# Patient Record
Sex: Female | Born: 1950 | Race: White | Hispanic: No | Marital: Married | Smoking: Never smoker
Health system: Southern US, Community
[De-identification: ages and names within clinical notes are randomized; demographics above are authoritative.]

## PROBLEM LIST (undated history)

## (undated) DIAGNOSIS — Z85828 Personal history of other malignant neoplasm of skin: Secondary | ICD-10-CM

## (undated) DIAGNOSIS — D219 Benign neoplasm of connective and other soft tissue, unspecified: Secondary | ICD-10-CM

## (undated) DIAGNOSIS — K635 Polyp of colon: Secondary | ICD-10-CM

## (undated) DIAGNOSIS — Z8619 Personal history of other infectious and parasitic diseases: Secondary | ICD-10-CM

## (undated) DIAGNOSIS — M1990 Unspecified osteoarthritis, unspecified site: Secondary | ICD-10-CM

## (undated) DIAGNOSIS — M419 Scoliosis, unspecified: Secondary | ICD-10-CM

## (undated) DIAGNOSIS — E785 Hyperlipidemia, unspecified: Secondary | ICD-10-CM

## (undated) DIAGNOSIS — N841 Polyp of cervix uteri: Secondary | ICD-10-CM

## (undated) DIAGNOSIS — L719 Rosacea, unspecified: Secondary | ICD-10-CM

## (undated) HISTORY — DX: Benign neoplasm of connective and other soft tissue, unspecified: D21.9

## (undated) HISTORY — DX: Unspecified osteoarthritis, unspecified site: M19.90

## (undated) HISTORY — DX: Personal history of other infectious and parasitic diseases: Z86.19

## (undated) HISTORY — DX: Scoliosis, unspecified: M41.9

## (undated) HISTORY — DX: Polyp of cervix uteri: N84.1

## (undated) HISTORY — DX: Polyp of colon: K63.5

## (undated) HISTORY — DX: Rosacea, unspecified: L71.9

## (undated) HISTORY — PX: BUNIONECTOMY: SHX129

## (undated) HISTORY — DX: Hyperlipidemia, unspecified: E78.5

## (undated) HISTORY — DX: Personal history of other malignant neoplasm of skin: Z85.828

---

## 2000-03-18 ENCOUNTER — Encounter: Payer: Self-pay | Admitting: *Deleted

## 2000-03-18 ENCOUNTER — Ambulatory Visit (HOSPITAL_COMMUNITY): Admission: RE | Admit: 2000-03-18 | Discharge: 2000-03-18 | Payer: Self-pay | Admitting: *Deleted

## 2001-02-28 ENCOUNTER — Other Ambulatory Visit: Admission: RE | Admit: 2001-02-28 | Discharge: 2001-02-28 | Payer: Self-pay | Admitting: Internal Medicine

## 2002-06-14 DIAGNOSIS — D219 Benign neoplasm of connective and other soft tissue, unspecified: Secondary | ICD-10-CM

## 2002-06-14 HISTORY — DX: Benign neoplasm of connective and other soft tissue, unspecified: D21.9

## 2002-06-26 ENCOUNTER — Other Ambulatory Visit: Admission: RE | Admit: 2002-06-26 | Discharge: 2002-06-26 | Payer: Self-pay | Admitting: Internal Medicine

## 2002-07-10 ENCOUNTER — Ambulatory Visit (HOSPITAL_COMMUNITY): Admission: RE | Admit: 2002-07-10 | Discharge: 2002-07-10 | Payer: Self-pay | Admitting: Internal Medicine

## 2002-07-10 ENCOUNTER — Encounter: Payer: Self-pay | Admitting: Internal Medicine

## 2002-07-18 ENCOUNTER — Encounter: Payer: Self-pay | Admitting: Internal Medicine

## 2003-06-15 DIAGNOSIS — K635 Polyp of colon: Secondary | ICD-10-CM

## 2003-06-15 HISTORY — DX: Polyp of colon: K63.5

## 2003-10-28 ENCOUNTER — Other Ambulatory Visit: Admission: RE | Admit: 2003-10-28 | Discharge: 2003-10-28 | Payer: Self-pay | Admitting: Internal Medicine

## 2003-11-22 ENCOUNTER — Ambulatory Visit (HOSPITAL_COMMUNITY): Admission: RE | Admit: 2003-11-22 | Discharge: 2003-11-22 | Payer: Self-pay | Admitting: Internal Medicine

## 2005-03-17 ENCOUNTER — Ambulatory Visit: Payer: Self-pay | Admitting: Internal Medicine

## 2005-03-17 ENCOUNTER — Other Ambulatory Visit: Admission: RE | Admit: 2005-03-17 | Discharge: 2005-03-17 | Payer: Self-pay | Admitting: Internal Medicine

## 2005-03-17 ENCOUNTER — Encounter: Payer: Self-pay | Admitting: Internal Medicine

## 2005-06-28 ENCOUNTER — Ambulatory Visit: Payer: Self-pay | Admitting: Internal Medicine

## 2005-07-20 ENCOUNTER — Ambulatory Visit (HOSPITAL_COMMUNITY): Admission: RE | Admit: 2005-07-20 | Discharge: 2005-07-20 | Payer: Self-pay | Admitting: Internal Medicine

## 2006-06-28 ENCOUNTER — Other Ambulatory Visit: Admission: RE | Admit: 2006-06-28 | Discharge: 2006-06-28 | Payer: Self-pay | Admitting: Internal Medicine

## 2006-06-28 ENCOUNTER — Ambulatory Visit: Payer: Self-pay | Admitting: Internal Medicine

## 2006-06-28 ENCOUNTER — Encounter: Payer: Self-pay | Admitting: Internal Medicine

## 2006-06-28 LAB — CONVERTED CEMR LAB
ALT: 19 units/L (ref 0–40)
AST: 27 units/L (ref 0–37)
Albumin: 4.3 g/dL (ref 3.5–5.2)
Alkaline Phosphatase: 54 units/L (ref 39–117)
BUN: 18 mg/dL (ref 6–23)
Basophils Absolute: 0 10*3/uL (ref 0.0–0.1)
Basophils Relative: 1 % (ref 0.0–1.0)
CO2: 28 meq/L (ref 19–32)
Calcium: 10 mg/dL (ref 8.4–10.5)
Chloride: 101 meq/L (ref 96–112)
Cholesterol: 280 mg/dL (ref 0–200)
Creatinine, Ser: 0.8 mg/dL (ref 0.4–1.2)
Direct LDL: 172.6 mg/dL
Eosinophils Relative: 1.1 % (ref 0.0–5.0)
GFR calc Af Amer: 96 mL/min
GFR calc non Af Amer: 79 mL/min
Glucose, Bld: 84 mg/dL (ref 70–99)
HCT: 41.5 % (ref 36.0–46.0)
HDL: 79.9 mg/dL (ref >39.0)
Hemoglobin: 14 g/dL (ref 12.0–15.0)
Lymphocytes Relative: 32.6 % (ref 12.0–46.0)
MCHC: 33.7 g/dL (ref 30.0–36.0)
MCV: 94 fL (ref 78.0–100.0)
Monocytes Absolute: 0.3 10*3/uL (ref 0.2–0.7)
Monocytes Relative: 6.7 % (ref 3.0–11.0)
Neutro Abs: 2.7 10*3/uL (ref 1.4–7.7)
Neutrophils Relative %: 58.6 % (ref 43.0–77.0)
Platelets: 264 10*3/uL (ref 150–400)
Potassium: 4.3 meq/L (ref 3.5–5.1)
RBC: 4.42 M/uL (ref 3.87–5.11)
RDW: 12.3 % (ref 11.5–14.6)
Sodium: 140 meq/L (ref 135–145)
TSH: 0.74 microintl units/mL (ref 0.35–5.50)
Total Bilirubin: 1.4 mg/dL — ABNORMAL HIGH (ref 0.3–1.2)
Total CHOL/HDL Ratio: 3.5
Total Protein: 7.6 g/dL (ref 6.0–8.3)
Triglycerides: 92 mg/dL (ref 0–149)
VLDL: 18 mg/dL (ref 0–40)
WBC: 4.4 10*3/uL — ABNORMAL LOW (ref 4.5–10.5)

## 2006-08-15 ENCOUNTER — Ambulatory Visit (HOSPITAL_COMMUNITY): Admission: RE | Admit: 2006-08-15 | Discharge: 2006-08-15 | Payer: Self-pay | Admitting: Internal Medicine

## 2006-12-12 ENCOUNTER — Ambulatory Visit: Payer: Self-pay | Admitting: Internal Medicine

## 2006-12-12 LAB — CONVERTED CEMR LAB
Cholesterol: 275 mg/dL (ref 0–200)
Direct LDL: 173.6 mg/dL
HDL: 66.5 mg/dL (ref >39.0)
Total CHOL/HDL Ratio: 4.1
Triglycerides: 43 mg/dL (ref 0–149)
VLDL: 9 mg/dL (ref 0–40)

## 2007-02-10 ENCOUNTER — Ambulatory Visit: Payer: Self-pay | Admitting: Internal Medicine

## 2007-02-10 DIAGNOSIS — E785 Hyperlipidemia, unspecified: Secondary | ICD-10-CM | POA: Insufficient documentation

## 2007-02-10 DIAGNOSIS — M542 Cervicalgia: Secondary | ICD-10-CM | POA: Insufficient documentation

## 2007-06-01 ENCOUNTER — Ambulatory Visit: Payer: Self-pay | Admitting: Internal Medicine

## 2007-06-05 LAB — CONVERTED CEMR LAB
CRP, High Sensitivity: <1 — ABNORMAL LOW
Cholesterol: 245 mg/dL
Direct LDL: 150.6 mg/dL
HDL: 84.5 mg/dL
Total CHOL/HDL Ratio: 2.9
Triglycerides: 59 mg/dL
VLDL: 12 mg/dL

## 2007-06-15 LAB — HM MAMMOGRAPHY: HM Mammogram: NORMAL

## 2007-10-17 ENCOUNTER — Encounter: Payer: Self-pay | Admitting: Internal Medicine

## 2007-10-18 ENCOUNTER — Encounter: Payer: Self-pay | Admitting: Internal Medicine

## 2008-03-11 ENCOUNTER — Ambulatory Visit: Payer: Self-pay | Admitting: Internal Medicine

## 2008-03-11 ENCOUNTER — Encounter: Payer: Self-pay | Admitting: Internal Medicine

## 2008-03-11 ENCOUNTER — Other Ambulatory Visit: Admission: RE | Admit: 2008-03-11 | Discharge: 2008-03-11 | Payer: Self-pay | Admitting: Internal Medicine

## 2008-03-11 DIAGNOSIS — Z85828 Personal history of other malignant neoplasm of skin: Secondary | ICD-10-CM | POA: Insufficient documentation

## 2008-03-11 DIAGNOSIS — G479 Sleep disorder, unspecified: Secondary | ICD-10-CM | POA: Insufficient documentation

## 2008-03-11 DIAGNOSIS — N841 Polyp of cervix uteri: Secondary | ICD-10-CM

## 2008-03-11 DIAGNOSIS — M67919 Unspecified disorder of synovium and tendon, unspecified shoulder: Secondary | ICD-10-CM | POA: Insufficient documentation

## 2008-03-11 DIAGNOSIS — Z87448 Personal history of other diseases of urinary system: Secondary | ICD-10-CM | POA: Insufficient documentation

## 2008-03-11 DIAGNOSIS — M719 Bursopathy, unspecified: Secondary | ICD-10-CM

## 2008-03-11 DIAGNOSIS — N760 Acute vaginitis: Secondary | ICD-10-CM | POA: Insufficient documentation

## 2008-03-11 HISTORY — DX: Polyp of cervix uteri: N84.1

## 2008-03-11 LAB — CONVERTED CEMR LAB
Nitrite: NEGATIVE
Urobilinogen, UA: 0.2

## 2008-03-18 LAB — CONVERTED CEMR LAB
ALT: 20 units/L (ref 0–35)
Basophils Relative: 1.1 % (ref 0.0–3.0)
Bilirubin, Direct: 0.1 mg/dL (ref 0.0–0.3)
CO2: 30 meq/L (ref 19–32)
Calcium: 9.4 mg/dL (ref 8.4–10.5)
Creatinine, Ser: 0.8 mg/dL (ref 0.4–1.2)
Glucose, Bld: 94 mg/dL (ref 70–99)
Hemoglobin: 13.5 g/dL (ref 12.0–15.0)
Lymphocytes Relative: 31.2 % (ref 12.0–46.0)
Monocytes Relative: 6.6 % (ref 3.0–12.0)
Neutro Abs: 2.5 10*3/uL (ref 1.4–7.7)
RBC: 4.13 M/uL (ref 3.87–5.11)
Sodium: 145 meq/L (ref 135–145)
TSH: 1.15 microintl units/mL (ref 0.35–5.50)
Total CHOL/HDL Ratio: 2.9
Total Protein: 7.3 g/dL (ref 6.0–8.3)
VLDL: 9 mg/dL (ref 0–40)
WBC: 4.2 10*3/uL — ABNORMAL LOW (ref 4.5–10.5)

## 2009-08-11 ENCOUNTER — Ambulatory Visit: Payer: Self-pay | Admitting: Internal Medicine

## 2009-08-11 LAB — CONVERTED CEMR LAB
ALT: 16 units/L (ref 0–35)
Basophils Relative: 1 % (ref 0.0–3.0)
CO2: 28 meq/L (ref 19–32)
Calcium: 9.5 mg/dL (ref 8.4–10.5)
Creatinine, Ser: 0.7 mg/dL (ref 0.4–1.2)
Eosinophils Relative: 3.5 % (ref 0.0–5.0)
Glucose, Bld: 81 mg/dL (ref 70–99)
HCT: 38.7 % (ref 36.0–46.0)
Hemoglobin, Urine: NEGATIVE
Hemoglobin: 13.1 g/dL (ref 12.0–15.0)
Ketones, ur: 15 mg/dL
Leukocytes, UA: NEGATIVE
Lymphs Abs: 1.2 10*3/uL (ref 0.7–4.0)
MCV: 94.1 fL (ref 78.0–100.0)
Monocytes Absolute: 0.3 10*3/uL (ref 0.1–1.0)
Neutro Abs: 2.2 10*3/uL (ref 1.4–7.7)
Nitrite: NEGATIVE
RBC: 4.11 M/uL (ref 3.87–5.11)
TSH: 0.79 microintl units/mL (ref 0.35–5.50)
Total Protein: 7.1 g/dL (ref 6.0–8.3)
Triglycerides: 42 mg/dL (ref 0.0–149.0)
WBC: 3.8 10*3/uL — ABNORMAL LOW (ref 4.5–10.5)

## 2009-08-25 ENCOUNTER — Ambulatory Visit: Payer: Self-pay | Admitting: Internal Medicine

## 2009-08-25 ENCOUNTER — Other Ambulatory Visit: Admission: RE | Admit: 2009-08-25 | Discharge: 2009-08-25 | Payer: Self-pay | Admitting: Internal Medicine

## 2009-08-25 DIAGNOSIS — L719 Rosacea, unspecified: Secondary | ICD-10-CM | POA: Insufficient documentation

## 2010-02-04 ENCOUNTER — Telehealth: Payer: Self-pay | Admitting: Internal Medicine

## 2010-07-14 NOTE — Assessment & Plan Note (Signed)
Summary: cpx--pap//ccm   Vital Signs:  Patient profile:   60 year old female Menstrual status:  postmenopausal Height:      65.5 inches Weight:      157 pounds BMI:     25.82 Pulse rate:   66 / minute BP sitting:   140 / 60  (right arm) Cuff size:   regular  Vitals Entered By: Romualdo Bolk, CMA (AAMA) (August 25, 2009 9:02 AM) CC: CPX with a pap LMP - Character: 50's     Menstrual Status postmenopausal Last PAP Result UNSATISFACTORY FOR EVALUATION.  THE SPECIMEN IS PROCESSED   History of Present Illness: [name] comes  in today for    preventive visit Since her last visit she has had no major changes in health.  She has had : Skin cancer removed  Dr Danella Deis .    Cervical polyp from gyne and pap repeated nl and mammo nl. No injury.   Sleep stable  would like refill of ambien for as needed use. Refill of metrogel for rosacea    Preventive Care Screening  Mammogram:    Date:  06/15/2007    Results:  normal   Last Tetanus Booster:    Date:  06/28/2006    Results:  Tdap   Colonoscopy:    Date:  12/24/2003    Results:  normal    Preventive Screening-Counseling & Management  Alcohol-Tobacco     Alcohol drinks/day: <1     Alcohol type: wine     Smoking Status: never  Caffeine-Diet-Exercise     Caffeine use/day: 5+     Does Patient Exercise: yes     Type of exercise: walking     Times/week: 7  Hep-HIV-STD-Contraception     Dental Visit-last 6 months yes     Sun Exposure-Excessive: no  Safety-Violence-Falls     Seat Belt Use: yes     Firearms in the Home: firearms in the home     Smoke Detectors: yes      Blood Transfusions:  no.        Travel History:  [place] years ago.    Current Medications (verified): 1)  Metrogel 1 % Gel (Metronidazole) .... Apply A Small Amount To Affected Area At Bedtime 2)  Ambien 10 Mg Tabs (Zolpidem Tartrate) .Marland Kitchen.. 1 By Mouth Hs As Needed Sleep  Allergies (verified): No Known Drug Allergies  Past  History:  Past medical, surgical, family and social histories (including risk factors) reviewed, and no changes noted (except as noted below).  Past Medical History: rosacea Skin cancer, hx of Fibroid  Per Dr [name] 2004 Colon polyps on colonoscopy 2005 Leda Min  Past Surgical History: Reviewed history from 03/11/2008 and no changes required. bunion surgery Dr Charlsie Merles  Past History:  Care Management: Dermatology: Danella Deis Gastroenterology: Russella Dar Gynecology: [facility]- in the past  Family History: Reviewed history from 03/11/2008 and no changes required. Family History of Sudden Death father in 75's  ? relalted to HBP Family History Osteoporosis  Mom is 70 and well with normal Dexa  some joint problems   Social History: Reviewed history from 03/11/2008 and no changes required. Married Never Smoked Alcohol use-yes Spouse had  Cabg this summer .  [employer]  professor  PHD  hhof 2  2 cats  Seat Belt Use:  yes Dental Care w/in 6 mos.:  yes Sun Exposure-Excessive:  no Blood Transfusions:  no  Review of Systems  The patient denies anorexia, fever, weight  loss, weight gain, vision loss, decreased hearing, hoarseness, chest pain, syncope, dyspnea on exertion, peripheral edema, prolonged cough, headaches, hemoptysis, abdominal pain, melena, hematochezia, severe indigestion/heartburn, hematuria, incontinence, genital sores, muscle weakness, suspicious skin lesions, transient blindness, difficulty walking, depression, unusual weight change, abnormal bleeding, enlarged lymph nodes, angioedema, and breast masses.   Physical Exam General Appearance: well developed, well nourished, no acute distress Eyes: conjunctiva and lids normal, PERRLA, EOMI, WNLglasses  Ears, Nose, Mouth, Throat: TM clear, nares clear, oral exam WNL Neck: supple, no lymphadenopathy, no thyromegaly, no JVD Respiratory: clear to auscultation and percussion, respiratory effort  normal Cardiovascular: regular rate and rhythm, S1-S2, no murmur, rub or gallop, no bruits, peripheral pulses normal and symmetric, no cyanosis, clubbing, edema or varicosities Chest: no scars, masses, tenderness; no asymmetry, skin changes, nipple discharge   Gastrointestinal: soft, non-tender; no hepatosplenomegaly, masses; active bowel sounds all quadrants, guaiac negative stool; no masses, tenderness, hemorrhoids  Genitourinary: no vaginal discharge, lesions; no masses or tenderness  poterior and small os no lesions Lymphatic: no cervical, axillary or inguinal adenopathy Musculoskeletal: gait normal, muscle tone and strength WNL, no joint swelling, effusions, discoloration, crepitus  Skin: clear, good turgor, color WNL, no rashes, lesions, or ulcerations  sun changes  Neurologic: normal mental status, normal reflexes, normal strength, sensation, and motion Psychiatric: alert; oriented to person, place and time Other Exam:   see labs   slightly low wbc nl diff and  ldl elevated bu nl ratio    Impression & Recommendations:  Problem # 1:  HEALTH MAINTENANCE EXAM, ADULT (ICD-V70.0) Discussed nutrition,exercise,diet,healthy weight, vitamin D and calcium.   Problem # 2:  ROUTINE GYNECOLOGICAL EXAM (ICD-V72.31)  pap done   polyp  gone   Orders: Pap Smear, Thin Prep ( Collection of) ([id number])  Problem # 3:  HYPERLIPIDEMIA (ICD-272.4) options dicussed   will do intensified lifestyle intervention  Labs Reviewed: SGOT: 21 (08/11/2009)   SGPT: 16 (08/11/2009)   HDL:74.80 (08/11/2009), 77.9 (03/11/2008)  LDL:DEL (03/11/2008), DEL (06/01/2007)  Chol:276 (08/11/2009), 228 (03/11/2008)  Trig:42.0 (08/11/2009), 43 (03/11/2008)  Problem # 4:  SKIN CANCER, HX OF (ICD-V10.83) suncprotection to continue.  Problem # 5:  ROSACEA (ICD-695.3) mild stable  continue  med as needed.  Problem # 6:  UNSPECIFIED SLEEP DISTURBANCE (ICD-780.50) Assessment: Comment Only  Complete Medication List: 1)   Metrogel 1 % Gel (Metronidazole) .... Apply a small amount to affected area at bedtime 2)  Ambien 10 Mg Tabs (Zolpidem tartrate) .Marland Kitchen.. 1 by mouth hs as needed sleep  Patient Instructions: 1)  Mediterranean diet .  To help with lipids. 2)  check yearly.   Prescriptions: METROGEL 1 % GEL (METRONIDAZOLE) Apply a small amount to affected area at bedtime  #60gm x 3   Entered and Authorized by:   [name] MD   Signed by:   [name] MD on 08/25/2009   Method used:   Print then Give to Patient   RxID:   [id number] AMBIEN 10 MG TABS (ZOLPIDEM TARTRATE) 1 by mouth hs as needed sleep  #30 x 0   Entered and Authorized by:   [name] MD   Signed by:   [name] MD on 08/25/2009   Method used:   Print then Give to Patient   RxID:   [id number]

## 2010-07-14 NOTE — Progress Notes (Signed)
Summary: UTI traveling  Phone Note Call from Patient Call back at [phone]   Summary of Call: UTI.  Urgency, burning, pinkish urine.  Traveling & unable to get Rx until Fri pm in [place].  She is working & cannot go to [facility].  Will call back tomorrow with phone number of drugstore there.  Requests medication.  NKDA or for Fri pm [facility] [address]. Initial call taken by: Rudy Jew, RN,  February 04, 2010 5:02 PM  Follow-up for Phone Call        if no fever  can do septra ds disp 6 1 by mouth two times a day  and follow up with [place] afer rx    to recheck urine or as needed.  Follow-up by: [name] MD,  February 04, 2010 11:20 PM    New/Updated Medications: SEPTRA DS 800-160 MG TABS (SULFAMETHOXAZOLE-TRIMETHOPRIM) one by mouth two times a day Prescriptions: SEPTRA DS 800-160 MG TABS (SULFAMETHOXAZOLE-TRIMETHOPRIM) one by mouth two times a day  #6 x 0   Entered by:   Lynann Beaver CMA   Authorized by:   [name] MD   Signed by:   Lynann Beaver CMA on 02/05/2010   Method used:   Electronically to        [facility]. [address] [phone]* (retail)       [address]       [place], [place]  [address]       Ph: [phone] or [phone]       Fax: [phone]   RxID:   [id number]  Pt. notified.

## 2010-10-06 ENCOUNTER — Other Ambulatory Visit: Payer: Self-pay | Admitting: Internal Medicine

## 2010-10-06 DIAGNOSIS — Z1231 Encounter for screening mammogram for malignant neoplasm of breast: Secondary | ICD-10-CM

## 2010-10-16 ENCOUNTER — Ambulatory Visit (HOSPITAL_COMMUNITY)
Admission: RE | Admit: 2010-10-16 | Discharge: 2010-10-16 | Disposition: A | Discharge status: Home / Self Care | Payer: BC Managed Care – PPO | Source: Ambulatory Visit | Attending: Internal Medicine | Admitting: Internal Medicine

## 2010-10-16 DIAGNOSIS — Z1231 Encounter for screening mammogram for malignant neoplasm of breast: Secondary | ICD-10-CM | POA: Insufficient documentation

## 2010-12-11 ENCOUNTER — Encounter: Payer: Self-pay | Admitting: Gastroenterology

## 2010-12-24 ENCOUNTER — Encounter: Payer: Self-pay | Admitting: Internal Medicine

## 2010-12-25 ENCOUNTER — Ambulatory Visit (INDEPENDENT_AMBULATORY_CARE_PROVIDER_SITE_OTHER): Payer: BC Managed Care – PPO | Admitting: Internal Medicine

## 2010-12-25 ENCOUNTER — Encounter: Payer: Self-pay | Admitting: Internal Medicine

## 2010-12-25 VITALS — BP 120/80 | HR 72 | Ht 65.25 in | Wt 157.0 lb

## 2010-12-25 DIAGNOSIS — Z0000 Encounter for general adult medical examination without abnormal findings: Secondary | ICD-10-CM

## 2010-12-25 DIAGNOSIS — Z85828 Personal history of other malignant neoplasm of skin: Secondary | ICD-10-CM

## 2010-12-25 DIAGNOSIS — L719 Rosacea, unspecified: Secondary | ICD-10-CM

## 2010-12-25 DIAGNOSIS — Z136 Encounter for screening for cardiovascular disorders: Secondary | ICD-10-CM

## 2010-12-25 DIAGNOSIS — G479 Sleep disorder, unspecified: Secondary | ICD-10-CM

## 2010-12-25 LAB — BASIC METABOLIC PANEL WITH GFR
BUN: 16 mg/dL (ref 6–23)
CO2: 29 meq/L (ref 19–32)
Calcium: 9.7 mg/dL (ref 8.4–10.5)
Chloride: 110 meq/L (ref 96–112)
Creatinine, Ser: 0.8 mg/dL (ref 0.4–1.2)
GFR: 80.21 mL/min
Glucose, Bld: 98 mg/dL (ref 70–99)
Potassium: 5.7 meq/L — ABNORMAL HIGH (ref 3.5–5.1)
Sodium: 145 meq/L (ref 135–145)

## 2010-12-25 LAB — LIPID PANEL: Triglycerides: 35 mg/dL (ref 0.0–149.0)

## 2010-12-25 LAB — CBC WITH DIFFERENTIAL/PLATELET
Basophils Absolute: 0 10*3/uL (ref 0.0–0.1)
Basophils Relative: 0.9 % (ref 0.0–3.0)
Eosinophils Absolute: 0.2 10*3/uL (ref 0.0–0.7)
Eosinophils Relative: 3.3 % (ref 0.0–5.0)
HCT: 39.3 % (ref 36.0–46.0)
Hemoglobin: 13.6 g/dL (ref 12.0–15.0)
Lymphocytes Relative: 33.6 % (ref 12.0–46.0)
Lymphs Abs: 1.6 10*3/uL (ref 0.7–4.0)
MCHC: 34.6 g/dL (ref 30.0–36.0)
MCV: 94.4 fl (ref 78.0–100.0)
Monocytes Absolute: 0.4 10*3/uL (ref 0.1–1.0)
Monocytes Relative: 8 % (ref 3.0–12.0)
Neutro Abs: 2.6 10*3/uL (ref 1.4–7.7)
Neutrophils Relative %: 54.2 % (ref 43.0–77.0)
Platelets: 214 10*3/uL (ref 150.0–400.0)
RBC: 4.16 Mil/uL (ref 3.87–5.11)
RDW: 14.5 % (ref 11.5–14.6)
WBC: 4.7 10*3/uL (ref 4.5–10.5)

## 2010-12-25 LAB — LDL CHOLESTEROL, DIRECT: Direct LDL: 150.5 mg/dL

## 2010-12-25 LAB — HEPATIC FUNCTION PANEL
ALT: 22 U/L (ref 0–35)
Bilirubin, Direct: 0.1 mg/dL (ref 0.0–0.3)
Total Protein: 7.5 g/dL (ref 6.0–8.3)

## 2010-12-25 MED ORDER — METRONIDAZOLE 1 % EX GEL: 1.0000 "application " | Freq: Every day | CUTANEOUS | Status: DC

## 2010-12-25 MED ORDER — ZOLPIDEM TARTRATE 10 MG PO TABS: 10.0000 mg | ORAL_TABLET | Freq: Every evening | ORAL | Status: DC | PRN

## 2010-12-25 NOTE — Patient Instructions (Signed)
Continue lifestyle intervention healthy eating and exercise . Get  zostavax when you turn 61  Will notify you  of labs when available.  If ok then check up in a year.  Call in meantime if needed.

## 2010-12-25 NOTE — Progress Notes (Signed)
  Subjective:    Patient ID: [name], female    DOB: [birth year], 60 y.o.   MRN: [id number]  HPI Patient comes in today for Preventive Health Care visit  Since last visit. She has done fairly well. No major changes to her health history She uses Ambien ocassional when she travels. She has some difficulty losing weight to her goal weight but exercises walks 4 miles a day and attends to her diet.   Review of Systems Skin cancer : Face    Had mohs surgery.   bcca   .     Rosacea  .  Stable The limitation of exercise except for time and location. History of facial shingles about a year ago. Did not involve her eye ROS:  GEN/ HEENTNo fever, significant weight changes sweats headaches vision problems hearing changes, CV/ PULM; No chest pain shortness of breath cough, syncope,edema  change in exercise tolerance. GI /GU: No adominal pain, vomiting, change in bowel habits. No blood in the stool. No significant GU symptoms. SKIN/HEME: ,no acute skin rashes suspicious lesions or bleeding. No lymphadenopathy, nodules, masses.  NEURO/ PSYCH:  No neurologic signs such as weakness numbness No depression anxiety. IMM/ Allergy: No unusual infections.  Allergy .   REST of 12 system review negative except as per history of present illness Past history family history social history reviewed in the electronic medical record.      Objective:   Physical Exam Physical Exam: Vital signs reviewed WUJ:WJXB is a well-developed well-nourished alert cooperative  white female who appears her stated age in no acute distress.  HEENT: normocephalic  traumatic , Eyes: PERRL EOM's full, conjunctiva clear, Nares: paten,t no deformity discharge or tenderness., Ears: no deformity EAC's clear TMs with normal landmarks. Mouth: clear OP, no lesions, edema.  Moist mucous membranes. Dentition in adequate repair. NECK: supple without masses, thyromegaly or bruits. CHEST/PULM:  Clear to auscultation and percussion breath  sounds equal no wheeze , rales or rhonchi. No chest wall deformities or tenderness. Breast: normal by inspection . No dimpling, discharge, masses, tenderness or discharge . LN: no cervical axillary inguinal adenopathy CV: PMI is nondisplaced, S1 S2 no gallops, murmurs, rubs. Peripheral pulses are full without delay.No JVD .  ABDOMEN: Bowel sounds normal nontender  No guard or rebound, no hepato splenomegal no CVA tenderness.  No hernia. Extremtities:  No clubbing cyanosis or edema, no acute joint swelling or redness no focal atrophy NEURO:  Oriented x3, cranial nerves 3-12 appear to be intact, no obvious focal weakness,gait within normal limits no abnormal reflexes or asymmetrical SKIN: No acute rashes normal turgor, color, no bruising or petechiae. Minimal redness for rosacea  PSYCH: Oriented, good eye contact, no obvious depression anxiety, cognition and judgment appear normal. EKG normal  Sinus rhythym  GU  per gyne       Assessment & Plan:  Preventive Health Care Counseled regarding healthy nutrition, exercise, sleep, injury prevention, calcium vit d and healthy weight . UTD  Has fibroids  .  Labs today Rosacea Hx of skin cancer  Sleep disturbance  ocass need for help with travel

## 2010-12-28 ENCOUNTER — Encounter: Payer: Self-pay | Admitting: *Deleted

## 2011-05-24 ENCOUNTER — Other Ambulatory Visit: Payer: Self-pay | Admitting: Dermatology

## 2012-03-02 ENCOUNTER — Other Ambulatory Visit: Payer: Self-pay | Admitting: Internal Medicine

## 2012-03-02 DIAGNOSIS — Z1231 Encounter for screening mammogram for malignant neoplasm of breast: Secondary | ICD-10-CM

## 2012-03-10 ENCOUNTER — Ambulatory Visit (HOSPITAL_COMMUNITY)
Admission: RE | Admit: 2012-03-10 | Discharge: 2012-03-10 | Disposition: A | Discharge status: Home / Self Care | Payer: BC Managed Care – PPO | Source: Ambulatory Visit | Attending: Internal Medicine | Admitting: Internal Medicine

## 2012-03-10 DIAGNOSIS — Z1231 Encounter for screening mammogram for malignant neoplasm of breast: Secondary | ICD-10-CM

## 2012-03-21 ENCOUNTER — Encounter: Payer: Self-pay | Admitting: Internal Medicine

## 2012-03-21 ENCOUNTER — Ambulatory Visit (INDEPENDENT_AMBULATORY_CARE_PROVIDER_SITE_OTHER): Payer: BC Managed Care – PPO | Admitting: Internal Medicine

## 2012-03-21 VITALS — BP 108/74 | HR 43 | Temp 98.6°F | Ht 65.0 in | Wt 142.0 lb

## 2012-03-21 DIAGNOSIS — E785 Hyperlipidemia, unspecified: Secondary | ICD-10-CM

## 2012-03-21 DIAGNOSIS — Z2911 Encounter for prophylactic immunotherapy for respiratory syncytial virus (RSV): Secondary | ICD-10-CM

## 2012-03-21 DIAGNOSIS — Z0000 Encounter for general adult medical examination without abnormal findings: Secondary | ICD-10-CM

## 2012-03-21 DIAGNOSIS — L719 Rosacea, unspecified: Secondary | ICD-10-CM

## 2012-03-21 DIAGNOSIS — M21969 Unspecified acquired deformity of unspecified lower leg: Secondary | ICD-10-CM

## 2012-03-21 DIAGNOSIS — G479 Sleep disorder, unspecified: Secondary | ICD-10-CM

## 2012-03-21 DIAGNOSIS — Z85828 Personal history of other malignant neoplasm of skin: Secondary | ICD-10-CM

## 2012-03-21 DIAGNOSIS — Z136 Encounter for screening for cardiovascular disorders: Secondary | ICD-10-CM

## 2012-03-21 DIAGNOSIS — Z23 Encounter for immunization: Secondary | ICD-10-CM

## 2012-03-21 LAB — CBC WITH DIFFERENTIAL/PLATELET
Basophils Absolute: 0 10*3/uL (ref 0.0–0.1)
Eosinophils Absolute: 0.1 10*3/uL (ref 0.0–0.7)
Eosinophils Relative: 2.5 % (ref 0.0–5.0)
HCT: 38.8 % (ref 36.0–46.0)
Lymphs Abs: 1.1 10*3/uL (ref 0.7–4.0)
MCV: 95.8 fl (ref 78.0–100.0)
Monocytes Absolute: 0.3 10*3/uL (ref 0.1–1.0)
Neutrophils Relative %: 66.9 % (ref 43.0–77.0)
Platelets: 227 10*3/uL (ref 150.0–400.0)
RDW: 13.9 % (ref 11.5–14.6)
WBC: 4.8 10*3/uL (ref 4.5–10.5)

## 2012-03-21 LAB — LIPID PANEL
Cholesterol: 273 mg/dL — ABNORMAL HIGH (ref 0–200)
HDL: 94.8 mg/dL (ref >39.00)
Total CHOL/HDL Ratio: 3
Triglycerides: 37 mg/dL (ref 0.0–149.0)
VLDL: 7.4 mg/dL (ref 0.0–40.0)

## 2012-03-21 LAB — HEPATIC FUNCTION PANEL
Bilirubin, Direct: 0 mg/dL (ref 0.0–0.3)
Total Bilirubin: 0.8 mg/dL (ref 0.3–1.2)

## 2012-03-21 LAB — BASIC METABOLIC PANEL
BUN: 21 mg/dL (ref 6–23)
Chloride: 107 mEq/L (ref 96–112)
Creatinine, Ser: 0.6 mg/dL (ref 0.4–1.2)
GFR: 108.12 mL/min (ref >60.00)
Glucose, Bld: 81 mg/dL (ref 70–99)
Potassium: 4.1 mEq/L (ref 3.5–5.1)

## 2012-03-21 LAB — TSH: TSH: 0.96 u[IU]/mL (ref 0.35–5.50)

## 2012-03-21 MED ORDER — METRONIDAZOLE 1 % EX GEL: 1.0000 "application " | Freq: Every day | CUTANEOUS | Status: DC

## 2012-03-21 MED ORDER — ZOLPIDEM TARTRATE 10 MG PO TABS: 10.0000 mg | ORAL_TABLET | Freq: Every evening | ORAL | Status: DC | PRN

## 2012-03-21 NOTE — Patient Instructions (Addendum)
Continue lifestyle intervention healthy eating and exercise . Shingles and flu vaccine today. Use ambien 5 - 10 as needed with caution as discussed  Will notify you  of labs when available.  Recheck  With podiatrist or foot doctor  opinions about your left foot.

## 2012-03-21 NOTE — Progress Notes (Signed)
Subjective:    Patient ID: [name], female    DOB: Oct 28, 1950, 61 y.o.   MRN: [id number]  HPI Patient comes in today for preventive visit and follow-up of medical issues. Update  history since  last visit: No major change in health status since last visit . Problem with left foot second toe now overriding and some callous. Has had of bunion surgery wants to avoid progression  No sig pain but deformity. Sleep taks med when travels likes to have some at home in case 1/2 to one as needed Rosacea: needs refill of meds . Had skin cancer removed left face   April  No major injuries  . Works on [employer]; hard to keep good weight   Review of Systems ROS:  GEN/ HEENT: No fever, significant weight changes sweats headaches vision problems hearing changes, CV/ PULM; No chest pain shortness of breath cough, syncope,edema  change in exercise tolerance. GI /GU: No adominal pain, vomiting, change in bowel habits. No blood in the stool. No significant GU symptoms. SKIN/HEME: ,no acute skin rashes suspicious lesions or bleeding. No lymphadenopathy, nodules, masses.  NEURO/ PSYCH:  No neurologic signs such as weakness numbness. No depression anxiety. IMM/ Allergy: No unusual infections.  Allergy .   REST of 12 system review negative except as per HPI Past Medical History  Diagnosis Date  . Rosacea   . Hx of skin cancer, basal cell   . Fibroid 2004    Per Dr. [name]  . Colon polyps 2005    on colonscopy [name]  . History of shingles     face and mouth    History   Social History  . Marital Status: Married    Spouse Name: N/A    Number of Children: N/A  . Years of Education: N/A   Occupational History  . Not on file.   Social History Main Topics  . Smoking status: Never Smoker   . Smokeless tobacco: Not on file  . Alcohol Use: Yes  . Drug Use: Not on file  . Sexually Active: Not on file   Other Topics Concern  . 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 Bunionectomy     Family History  Problem Relation Age of Onset  . Hypertension Father   . Osteoporosis      No Known Allergies  Current Outpatient Prescriptions on File Prior to Visit  Medication Sig Dispense Refill  . zolpidem (AMBIEN) 10 MG tablet Take 1 tablet (10 mg total) by mouth at bedtime as needed.  30 tablet  1  metronidazole daily   BP 108/74  Pulse 43  Temp 98.6 F (37 C) (Oral)  Ht 5\' 5"  (1.651 m)  Wt 142 lb (64.411 kg)  BMI 23.63 kg/m2  SpO2 96%    Objective:   Physical Exam Physical Exam: Vital signs reviewed FAO:ZHYQ is a well-developed well-nourished alert cooperative  white female who appears her stated age in no acute distress.  HEENT: normocephalic atraumatic , Eyes: PERRL EOM's full, conjunctiva clear, Nares: paten,t no deformity discharge or tenderness., Ears: no deformity EAC's clear TMs with normal landmarks. Mouth: clear OP, no lesions, edema.  Moist mucous membranes. Dentition in adequate repair. NECK: supple without masses, thyromegaly or bruits. CHEST/PULM:  Clear to auscultation and percussion breath sounds equal no wheeze ,  rales or rhonchi. No chest wall deformities or tenderness. CV: PMI is nondisplaced, S1 S2 no gallops, murmurs, rubs. Peripheral pulses are full without delay.No JVD .  Breast: normal by inspection . No dimpling, discharge, masses, tenderness or discharge . ABDOMEN: Bowel sounds normal nontender  No guard or rebound, no hepato splenomegal no CVA tenderness.  No hernia. Extremtities:  No clubbing cyanosis or edema, no acute joint swelling or redness no focal atrophy left foot with mild bunion deformity and override second toe.  NEURO:  Oriented x3, cranial nerves 3-12 appear to be intact, no obvious focal weakness,gait within normal limits no abnormal reflexes or asymmetrical SKIN: No acute rashes normal  turgor, color, no bruising or petechiae. Mild facial erythema PSYCH: Oriented, good eye contact, no obvious depression anxiety, cognition and judgment appear normal. LN: no cervical axillary inguinal adenopathy    Assessment & Plan:   Preventive Health Care Counseled regarding healthy nutrition, exercise, sleep, injury prevention, calcium vit d and healthy weight . zostavax and  Flu vaccine today  utd on mammo and pap Sleep   Risk benefit of medication discussed. Use 5 - 10 mg as needed Foot  Changes  Progressing valgus deformity early in foot that prev had surgery  Advise  See podiatrist or foot surgeon for opinion Skin cancer hx and new lesions concern. Per derm  Rosacea  Refill medication Hx of elevated lipids  Continue healthy lsi

## 2012-03-26 ENCOUNTER — Encounter: Payer: Self-pay | Admitting: Internal Medicine

## 2012-03-26 DIAGNOSIS — M21969 Unspecified acquired deformity of unspecified lower leg: Secondary | ICD-10-CM | POA: Insufficient documentation

## 2012-05-18 ENCOUNTER — Other Ambulatory Visit: Payer: Self-pay | Admitting: Dermatology

## 2013-02-28 ENCOUNTER — Encounter: Payer: Self-pay | Admitting: Family Medicine

## 2013-02-28 ENCOUNTER — Ambulatory Visit (INDEPENDENT_AMBULATORY_CARE_PROVIDER_SITE_OTHER): Payer: BC Managed Care – PPO | Admitting: Family Medicine

## 2013-02-28 VITALS — BP 110/70 | Temp 98.2°F | Wt 150.0 lb

## 2013-02-28 DIAGNOSIS — J069 Acute upper respiratory infection, unspecified: Secondary | ICD-10-CM

## 2013-02-28 DIAGNOSIS — H109 Unspecified conjunctivitis: Secondary | ICD-10-CM

## 2013-02-28 MED ORDER — SULFACETAMIDE SODIUM 10 % OP SOLN: 1.0000 [drp] | OPHTHALMIC | Status: DC

## 2013-02-28 NOTE — Progress Notes (Signed)
Chief Complaint  Patient presents with  . URI    x 2 weeks; mucus in right eye     HPI:  Acute visit for:  1) "Pink Eye": -has a bad cold: nasal congestion, drainage, cough for a few days -had some drainage out of R eye 2 days ago - used over the counter saline -has improved some but remains pink, sandy a little, clear drainage, a little crusting -in schools a lot -no vision loss,HA, nausea, pain in eye, fevers   ROS: See pertinent positives and negatives per HPI.  Past Medical History  Diagnosis Date  . Rosacea   . Hx of skin cancer, basal cell   . Fibroid 2004    Per Dr. [name]  . Colon polyps 2005    on colonscopy [name]  . History of shingles     face and mouth  . CERVICAL POLYP 03/11/2008    Qualifier: Diagnosis of  By: [name] MD, [name]     Past Surgical History  Procedure Laterality Date  . Bunionectomy      Family History  Problem Relation Age of Onset  . Hypertension Father   . Osteoporosis      History   Social History  . Marital Status: Married    Spouse Name: N/A    Number of Children: N/A  . Years of Education: N/A   Social History Main Topics  . Smoking status: Never Smoker   . Smokeless tobacco: None  . Alcohol Use: Yes  . Drug Use: None  . Sexual Activity: None   Other Topics Concern  . 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   hh of 2    2 cats          Current outpatient prescriptions:Flaxseed, Linseed, (FLAX SEED OIL PO), Take 1 tablet by mouth daily., Disp: , Rfl: ;  Krill Oil CAPS, Take 1 capsule by mouth daily., Disp: , Rfl: ;  metroNIDAZOLE (METROGEL) 1 % gel, Apply 1 application topically daily., Disp: 45 g, Rfl: 3;  Omega-3 Fatty Acids (FISH OIL) 1200 MG CAPS, Take 1 capsule by mouth daily., Disp: , Rfl:  zolpidem (AMBIEN) 10 MG tablet, Take 1 tablet (10 mg total) by mouth at bedtime as needed., Disp: 30 tablet, Rfl: 1;  sulfacetamide (BLEPH-10)  10 % ophthalmic solution, Place 1 drop into the right eye every 3 (three) hours., Disp: 15 mL, Rfl: 0  EXAM:  Filed Vitals:   02/28/13 0759  BP: 110/70  Temp: 98.2 F (36.8 C)    Body mass index is 24.96 kg/(m^2).  GENERAL: vitals reviewed and listed above, alert, oriented, appears well hydrated and in no acute distress  HEENT: atraumatic, pink conjuntiva L, clear drainage from L eye, PERRLA, normal EOM, visual acuity grossly intact, normal optho exam otherwise, no obvious abnormalities on inspection of external nose and ears, normal appearance of ear canals and TMs, clear nasal congestion, mild post oropharyngeal erythema with PND, no tonsillar edema or exudate, no sinus TTP  NECK: no obvious masses on inspection  LUNGS: clear to auscultation bilaterally, no wheezes, rales or rhonchi, good air movement  CV: HRRR, no peripheral edema  MS: moves all extremities without noticeable abnormality  PSYCH: pleasant and cooperative, no obvious depression or anxiety  ASSESSMENT AND PLAN:  Discussed the following assessment and plan:  Conjunctivitis - Plan: sulfacetamide (BLEPH-10) 10 %  ophthalmic solution  Upper respiratory infection  -compresses, saline and abx drops for eye with return and emergency precautions -supportive care and return precautions for VURI Recommendations per orders an instructions, risks and use of -medications and return precautions discussed. -Patient advised to return or notify a doctor immediately if symptoms worsen or persist or new concerns arise.  Patient Instructions  Conjunctivitis Conjunctivitis is commonly called "pink eye." Conjunctivitis can be caused by bacterial or viral infection, allergies, or injuries. There is usually redness of the lining of the eye, itching, discomfort, and sometimes discharge. There may be deposits of matter along the eyelids. A viral infection usually causes a watery discharge, while a bacterial infection causes a  yellowish, thick discharge. Pink eye is very contagious and spreads by direct contact. You may be given antibiotic eyedrops as part of your treatment. Before using your eye medicine, remove all drainage from the eye by washing gently with warm water and cotton balls. Continue to use the medication until you have awakened 2 mornings in a row without discharge from the eye. Do not rub your eye. This increases the irritation and helps spread infection. Use separate towels from other household members. Wash your hands with soap and water before and after touching your eyes. Use cold compresses to reduce pain and sunglasses to relieve irritation from light. Do not wear contact lenses or wear eye makeup until the infection is gone. SEEK MEDICAL CARE IF:   Your symptoms are not better after 3 days of treatment.  You have increased pain or trouble seeing.  The outer eyelids become very red or swollen. Document Released: 07/08/2004 Document Revised: 08/23/2011 Document Reviewed: 05/31/2005 [facility] Patient Information 2014 [place], Lona Kettle, Dahlia Client R.

## 2013-02-28 NOTE — Patient Instructions (Signed)

## 2013-03-08 ENCOUNTER — Other Ambulatory Visit: Payer: Self-pay | Admitting: Internal Medicine

## 2013-03-08 DIAGNOSIS — Z1231 Encounter for screening mammogram for malignant neoplasm of breast: Secondary | ICD-10-CM

## 2013-04-19 ENCOUNTER — Other Ambulatory Visit: Payer: Self-pay

## 2013-06-19 ENCOUNTER — Telehealth: Payer: Self-pay | Admitting: Internal Medicine

## 2013-06-19 NOTE — Telephone Encounter (Signed)
Opened in error

## 2013-07-02 ENCOUNTER — Ambulatory Visit (HOSPITAL_COMMUNITY)
Admission: RE | Admit: 2013-07-02 | Discharge: 2013-07-02 | Disposition: A | Discharge status: Home / Self Care | Payer: BC Managed Care – PPO | Source: Ambulatory Visit | Attending: Internal Medicine | Admitting: Internal Medicine

## 2013-07-02 DIAGNOSIS — Z1231 Encounter for screening mammogram for malignant neoplasm of breast: Secondary | ICD-10-CM

## 2013-07-04 ENCOUNTER — Encounter: Payer: BC Managed Care – PPO | Admitting: Internal Medicine

## 2013-07-04 ENCOUNTER — Ambulatory Visit (HOSPITAL_COMMUNITY): Payer: BC Managed Care – PPO

## 2013-09-28 ENCOUNTER — Other Ambulatory Visit (HOSPITAL_COMMUNITY)
Admission: RE | Admit: 2013-09-28 | Discharge: 2013-09-28 | Disposition: A | Discharge status: Home / Self Care | Payer: BC Managed Care – PPO | Source: Ambulatory Visit | Attending: Internal Medicine | Admitting: Internal Medicine

## 2013-09-28 ENCOUNTER — Encounter: Payer: Self-pay | Admitting: Internal Medicine

## 2013-09-28 ENCOUNTER — Ambulatory Visit (INDEPENDENT_AMBULATORY_CARE_PROVIDER_SITE_OTHER): Payer: BC Managed Care – PPO | Admitting: Internal Medicine

## 2013-09-28 VITALS — BP 120/74 | HR 72 | Temp 98.2°F | Ht 65.5 in | Wt 149.0 lb

## 2013-09-28 DIAGNOSIS — E785 Hyperlipidemia, unspecified: Secondary | ICD-10-CM

## 2013-09-28 DIAGNOSIS — M5432 Sciatica, left side: Secondary | ICD-10-CM

## 2013-09-28 DIAGNOSIS — Z01419 Encounter for gynecological examination (general) (routine) without abnormal findings: Secondary | ICD-10-CM | POA: Insufficient documentation

## 2013-09-28 DIAGNOSIS — B351 Tinea unguium: Secondary | ICD-10-CM

## 2013-09-28 DIAGNOSIS — Z0000 Encounter for general adult medical examination without abnormal findings: Secondary | ICD-10-CM

## 2013-09-28 DIAGNOSIS — Z1151 Encounter for screening for human papillomavirus (HPV): Secondary | ICD-10-CM | POA: Insufficient documentation

## 2013-09-28 DIAGNOSIS — M5430 Sciatica, unspecified side: Secondary | ICD-10-CM

## 2013-09-28 HISTORY — DX: Sciatica, left side: M54.32

## 2013-09-28 LAB — CBC WITH DIFFERENTIAL/PLATELET
BASOS PCT: 1.2 % (ref 0.0–3.0)
Basophils Absolute: 0.1 10*3/uL (ref 0.0–0.1)
EOS PCT: 2.7 % (ref 0.0–5.0)
Eosinophils Absolute: 0.1 10*3/uL (ref 0.0–0.7)
HEMATOCRIT: 39.9 % (ref 36.0–46.0)
Hemoglobin: 13.3 g/dL (ref 12.0–15.0)
LYMPHS ABS: 1.2 10*3/uL (ref 0.7–4.0)
Lymphocytes Relative: 26 % (ref 12.0–46.0)
MCHC: 33.4 g/dL (ref 30.0–36.0)
MCV: 93.9 fl (ref 78.0–100.0)
MONO ABS: 0.4 10*3/uL (ref 0.1–1.0)
MONOS PCT: 8.2 % (ref 3.0–12.0)
Neutro Abs: 2.9 10*3/uL (ref 1.4–7.7)
Neutrophils Relative %: 61.9 % (ref 43.0–77.0)
PLATELETS: 222 10*3/uL (ref 150.0–400.0)
RBC: 4.24 Mil/uL (ref 3.87–5.11)
RDW: 14.1 % (ref 11.5–14.6)
WBC: 4.6 10*3/uL (ref 4.5–10.5)

## 2013-09-28 LAB — HEPATIC FUNCTION PANEL
ALBUMIN: 4 g/dL (ref 3.5–5.2)
ALT: 17 U/L (ref 0–35)
AST: 29 U/L (ref 0–37)
Alkaline Phosphatase: 53 U/L (ref 39–117)
Bilirubin, Direct: 0.1 mg/dL (ref 0.0–0.3)
TOTAL PROTEIN: 7 g/dL (ref 6.0–8.3)
Total Bilirubin: 0.9 mg/dL (ref 0.3–1.2)

## 2013-09-28 LAB — BASIC METABOLIC PANEL
BUN: 20 mg/dL (ref 6–23)
CHLORIDE: 107 meq/L (ref 96–112)
CO2: 29 mEq/L (ref 19–32)
Calcium: 9.6 mg/dL (ref 8.4–10.5)
Creatinine, Ser: 0.6 mg/dL (ref 0.4–1.2)
GFR: 116.49 mL/min (ref >60.00)
Glucose, Bld: 86 mg/dL (ref 70–99)
POTASSIUM: 4.4 meq/L (ref 3.5–5.1)
SODIUM: 143 meq/L (ref 135–145)

## 2013-09-28 LAB — LIPID PANEL
CHOL/HDL RATIO: 3
Cholesterol: 258 mg/dL — ABNORMAL HIGH (ref 0–200)
HDL: 87 mg/dL (ref >39.00)
LDL Cholesterol: 167 mg/dL — ABNORMAL HIGH (ref 0–99)
TRIGLYCERIDES: 21 mg/dL (ref 0.0–149.0)
VLDL: 4.2 mg/dL (ref 0.0–40.0)

## 2013-09-28 LAB — TSH: TSH: 0.93 u[IU]/mL (ref 0.35–5.50)

## 2013-09-28 MED ORDER — TERBINAFINE HCL 250 MG PO TABS: 250.0000 mg | ORAL_TABLET | Freq: Every day | ORAL | Status: DC

## 2013-09-28 NOTE — Patient Instructions (Addendum)
Will notify you  of labs when available. Continue lifestyle intervention healthy eating and exercise . Ok to do a trial of of lamisil.  Trial    For now as discucssed . Sciatica  Sometimes back exrcises help  alevel ok  Will notify you  of labs when available. Wellness visit in a year    Sciatica with Rehab The sciatic nerve runs from the back down the leg and is responsible for sensation and control of the muscles in the back (posterior) side of the thigh, lower leg, and foot. Sciatica is a condition that is characterized by inflammation of this nerve.  SYMPTOMS   Signs of nerve damage, including numbness and/or weakness along the posterior side of the lower extremity.  Pain in the back of the thigh that may also travel down the leg.  Pain that worsens when sitting for long periods of time.  Occasionally, pain in the back or buttock. CAUSES  Inflammation of the sciatic nerve is the cause of sciatica. The inflammation is due to something irritating the nerve. Common sources of irritation include:  Sitting for long periods of time.  Direct trauma to the nerve.  Arthritis of the spine.  Herniated or ruptured disk.  Slipping of the vertebrae (spondylolithesis)  Pressure from soft tissues, such as muscles or ligament-like tissue (fascia). RISK INCREASES WITH:  Sports that place pressure or stress on the spine (football or weightlifting).  Poor strength and flexibility.  Failure to warm-up properly before activity.  Family history of low back pain or disk disorders.  Previous back injury or surgery.  Poor body mechanics, especially when lifting, or poor posture. PREVENTION   Warm up and stretch properly before activity.  Maintain physical fitness:  Strength, flexibility, and endurance.  Cardiovascular fitness.  Learn and use proper technique, especially with posture and lifting. When possible, have coach correct improper technique.  Avoid activities that place  stress on the spine. PROGNOSIS If treated properly, then sciatica usually resolves within 6 weeks. However, occasionally surgery is necessary.  RELATED COMPLICATIONS   Permanent nerve damage, including pain, numbness, tingle, or weakness.  Chronic back pain.  Risks of surgery: infection, bleeding, nerve damage, or damage to surrounding tissues. TREATMENT Treatment initially involves resting from any activities that aggravate your symptoms. The use of ice and medication may help reduce pain and inflammation. The use of strengthening and stretching exercises may help reduce pain with activity. These exercises may be performed at home or with referral to a therapist. A therapist may recommend further treatments, such as transcutaneous electronic nerve stimulation (TENS) or ultrasound. Your caregiver may recommend corticosteroid injections to help reduce inflammation of the sciatic nerve. If symptoms persist despite non-surgical (conservative) treatment, then surgery may be recommended. MEDICATION  If pain medication is necessary, then nonsteroidal anti-inflammatory medications, such as aspirin and ibuprofen, or other minor pain relievers, such as acetaminophen, are often recommended.  Do not take pain medication for 7 days before surgery.  Prescription pain relievers may be given if deemed necessary by your caregiver. Use only as directed and only as much as you need.  Ointments applied to the skin may be helpful.  Corticosteroid injections may be given by your caregiver. These injections should be reserved for the most serious cases, because they may only be given a certain number of times. HEAT AND COLD  Cold treatment (icing) relieves pain and reduces inflammation. Cold treatment should be applied for 10 to 15 minutes every 2 to 3 hours for inflammation  and pain and immediately after any activity that aggravates your symptoms. Use ice packs or massage the area with a piece of ice (ice  massage).  Heat treatment may be used prior to performing the stretching and strengthening activities prescribed by your caregiver, physical therapist, or athletic trainer. Use a heat pack or soak the injury in warm water. SEEK MEDICAL CARE IF:  Treatment seems to offer no benefit, or the condition worsens.  Any medications produce adverse side effects. EXERCISES  RANGE OF MOTION (ROM) AND STRETCHING EXERCISES - Sciatica Most people with sciatic will find that their symptoms worsen with either excessive bending forward (flexion) or arching at the low back (extension). The exercises which will help resolve your symptoms will focus on the opposite motion. Your physician, physical therapist or athletic trainer will help you determine which exercises will be most helpful to resolve your low back pain. Do not complete any exercises without first consulting with your clinician. Discontinue any exercises which worsen your symptoms until you speak to your clinician. If you have pain, numbness or tingling which travels down into your buttocks, leg or foot, the goal of the therapy is for these symptoms to move closer to your back and eventually resolve. Occasionally, these leg symptoms will get better, but your low back pain may worsen; this is typically an indication of progress in your rehabilitation. Be certain to be very alert to any changes in your symptoms and the activities in which you participated in the 24 hours prior to the change. Sharing this information with your clinician will allow him/her to most efficiently treat your condition. These exercises may help you when beginning to rehabilitate your injury. Your symptoms may resolve with or without further involvement from your physician, physical therapist or athletic trainer. While completing these exercises, remember:   Restoring tissue flexibility helps normal motion to return to the joints. This allows healthier, less painful movement and  activity.  An effective stretch should be held for at least 30 seconds.  A stretch should never be painful. You should only feel a gentle lengthening or release in the stretched tissue. FLEXION RANGE OF MOTION AND STRETCHING EXERCISES: STRETCH  Flexion, Single Knee to Chest   Lie on a firm bed or floor with both legs extended in front of you.  Keeping one leg in contact with the floor, bring your opposite knee to your chest. Hold your leg in place by either grabbing behind your thigh or at your knee.  Pull until you feel a gentle stretch in your low back. Hold __________ seconds.  Slowly release your grasp and repeat the exercise with the opposite side. Repeat __________ times. Complete this exercise __________ times per day.  STRETCH  Flexion, Double Knee to Chest  Lie on a firm bed or floor with both legs extended in front of you.  Keeping one leg in contact with the floor, bring your opposite knee to your chest.  Tense your stomach muscles to support your back and then lift your other knee to your chest. Hold your legs in place by either grabbing behind your thighs or at your knees.  Pull both knees toward your chest until you feel a gentle stretch in your low back. Hold __________ seconds.  Tense your stomach muscles and slowly return one leg at a time to the floor. Repeat __________ times. Complete this exercise __________ times per day.  STRETCH  Low Trunk Rotation   Lie on a firm bed or floor. Keeping your  legs in front of you, bend your knees so they are both pointed toward the ceiling and your feet are flat on the floor.  Extend your arms out to the side. This will stabilize your upper body by keeping your shoulders in contact with the floor.  Gently and slowly drop both knees together to one side until you feel a gentle stretch in your low back. Hold for __________ seconds.  Tense your stomach muscles to support your low back as you bring your knees back to the starting  position. Repeat the exercise to the other side. Repeat __________ times. Complete this exercise __________ times per day  EXTENSION RANGE OF MOTION AND FLEXIBILITY EXERCISES: STRETCH  Extension, Prone on Elbows  Lie on your stomach on the floor, a bed will be too soft. Place your palms about shoulder width apart and at the height of your head.  Place your elbows under your shoulders. If this is too painful, stack pillows under your chest.  Allow your body to relax so that your hips drop lower and make contact more completely with the floor.  Hold this position for __________ seconds.  Slowly return to lying flat on the floor. Repeat __________ times. Complete this exercise __________ times per day.  RANGE OF MOTION  Extension, Prone Press Ups  Lie on your stomach on the floor, a bed will be too soft. Place your palms about shoulder width apart and at the height of your head.  Keeping your back as relaxed as possible, slowly straighten your elbows while keeping your hips on the floor. You may adjust the placement of your hands to maximize your comfort. As you gain motion, your hands will come more underneath your shoulders.  Hold this position __________ seconds.  Slowly return to lying flat on the floor. Repeat __________ times. Complete this exercise __________ times per day.  STRENGTHENING EXERCISES - Sciatica  These exercises may help you when beginning to rehabilitate your injury. These exercises should be done near your "sweet spot." This is the neutral, low-back arch, somewhere between fully rounded and fully arched, that is your least painful position. When performed in this safe range of motion, these exercises can be used for people who have either a flexion or extension based injury. These exercises may resolve your symptoms with or without further involvement from your physician, physical therapist or athletic trainer. While completing these exercises, remember:   Muscles can  gain both the endurance and the strength needed for everyday activities through controlled exercises.  Complete these exercises as instructed by your physician, physical therapist or athletic trainer. Progress with the resistance and repetition exercises only as your caregiver advises.  You may experience muscle soreness or fatigue, but the pain or discomfort you are trying to eliminate should never worsen during these exercises. If this pain does worsen, stop and make certain you are following the directions exactly. If the pain is still present after adjustments, discontinue the exercise until you can discuss the trouble with your clinician. STRENGTHENING Deep Abdominals, Pelvic Tilt   Lie on a firm bed or floor. Keeping your legs in front of you, bend your knees so they are both pointed toward the ceiling and your feet are flat on the floor.  Tense your lower abdominal muscles to press your low back into the floor. This motion will rotate your pelvis so that your tail bone is scooping upwards rather than pointing at your feet or into the floor.  With a gentle tension  and even breathing, hold this position for __________ seconds. Repeat __________ times. Complete this exercise __________ times per day.  STRENGTHENING  Abdominals, Crunches   Lie on a firm bed or floor. Keeping your legs in front of you, bend your knees so they are both pointed toward the ceiling and your feet are flat on the floor. Cross your arms over your chest.  Slightly tip your chin down without bending your neck.  Tense your abdominals and slowly lift your trunk high enough to just clear your shoulder blades. Lifting higher can put excessive stress on the low back and does not further strengthen your abdominal muscles.  Control your return to the starting position. Repeat __________ times. Complete this exercise __________ times per day.  STRENGTHENING  Quadruped, Opposite UE/LE Lift  Assume a hands and knees position  on a firm surface. Keep your hands under your shoulders and your knees under your hips. You may place padding under your knees for comfort.  Find your neutral spine and gently tense your abdominal muscles so that you can maintain this position. Your shoulders and hips should form a rectangle that is parallel with the floor and is not twisted.  Keeping your trunk steady, lift your right hand no higher than your shoulder and then your left leg no higher than your hip. Make sure you are not holding your breath. Hold this position __________ seconds.  Continuing to keep your abdominal muscles tense and your back steady, slowly return to your starting position. Repeat with the opposite arm and leg. Repeat __________ times. Complete this exercise __________ times per day.  STRENGTHENING  Abdominals and Quadriceps, Straight Leg Raise   Lie on a firm bed or floor with both legs extended in front of you.  Keeping one leg in contact with the floor, bend the other knee so that your foot can rest flat on the floor.  Find your neutral spine, and tense your abdominal muscles to maintain your spinal position throughout the exercise.  Slowly lift your straight leg off the floor about 6 inches for a count of 15, making sure to not hold your breath.  Still keeping your neutral spine, slowly lower your leg all the way to the floor. Repeat this exercise with each leg __________ times. Complete this exercise __________ times per day. POSTURE AND BODY MECHANICS CONSIDERATIONS - Sciatica Keeping correct posture when sitting, standing or completing your activities will reduce the stress put on different body tissues, allowing injured tissues a chance to heal and limiting painful experiences. The following are general guidelines for improved posture. Your physician or physical therapist will provide you with any instructions specific to your needs. While reading these guidelines, remember:  The exercises prescribed by  your provider will help you have the flexibility and strength to maintain correct postures.  The correct posture provides the optimal environment for your joints to work. All of your joints have less wear and tear when properly supported by a spine with good posture. This means you will experience a healthier, less painful body.  Correct posture must be practiced with all of your activities, especially prolonged sitting and standing. Correct posture is as important when doing repetitive low-stress activities (typing) as it is when doing a single heavy-load activity (lifting). RESTING POSITIONS Consider which positions are most painful for you when choosing a resting position. If you have pain with flexion-based activities (sitting, bending, stooping, squatting), choose a position that allows you to rest in a less flexed posture.  You would want to avoid curling into a fetal position on your side. If your pain worsens with extension-based activities (prolonged standing, working overhead), avoid resting in an extended position such as sleeping on your stomach. Most people will find more comfort when they rest with their spine in a more neutral position, neither too rounded nor too arched. Lying on a non-sagging bed on your side with a pillow between your knees, or on your back with a pillow under your knees will often provide some relief. Keep in mind, being in any one position for a prolonged period of time, no matter how correct your posture, can still lead to stiffness. PROPER SITTING POSTURE In order to minimize stress and discomfort on your spine, you must sit with correct posture Sitting with good posture should be effortless for a healthy body. Returning to good posture is a gradual process. Many people can work toward this most comfortably by using various supports until they have the flexibility and strength to maintain this posture on their own. When sitting with proper posture, your ears will fall  over your shoulders and your shoulders will fall over your hips. You should use the back of the chair to support your upper back. Your low back will be in a neutral position, just slightly arched. You may place a small pillow or folded towel at the base of your low back for support.  When working at a desk, create an environment that supports good, upright posture. Without extra support, muscles fatigue and lead to excessive strain on joints and other tissues. Keep these recommendations in mind: CHAIR:   A chair should be able to slide under your desk when your back makes contact with the back of the chair. This allows you to work closely.  The chair's height should allow your eyes to be level with the upper part of your monitor and your hands to be slightly lower than your elbows. BODY POSITION  Your feet should make contact with the floor. If this is not possible, use a foot rest.  Keep your ears over your shoulders. This will reduce stress on your neck and low back. INCORRECT SITTING POSTURES   If you are feeling tired and unable to assume a healthy sitting posture, do not slouch or slump. This puts excessive strain on your back tissues, causing more damage and pain. Healthier options include:  Using more support, like a lumbar pillow.  Switching tasks to something that requires you to be upright or walking.  Talking a brief walk.  Lying down to rest in a neutral-spine position. PROLONGED STANDING WHILE SLIGHTLY LEANING FORWARD  When completing a task that requires you to lean forward while standing in one place for a long time, place either foot up on a stationary 2-4 inch high object to help maintain the best posture. When both feet are on the ground, the low back tends to lose its slight inward curve. If this curve flattens (or becomes too large), then the back and your other joints will experience too much stress, fatigue more quickly and can cause pain.  CORRECT STANDING  POSTURES Proper standing posture should be assumed with all daily activities, even if they only take a few moments, like when brushing your teeth. As in sitting, your ears should fall over your shoulders and your shoulders should fall over your hips. You should keep a slight tension in your abdominal muscles to brace your spine. Your tailbone should point down to the ground, not  behind your body, resulting in an over-extended swayback posture.  INCORRECT STANDING POSTURES  Common incorrect standing postures include a forward head, locked knees and/or an excessive swayback. WALKING Walk with an upright posture. Your ears, shoulders and hips should all line-up. PROLONGED ACTIVITY IN A FLEXED POSITION When completing a task that requires you to bend forward at your waist or lean over a low surface, try to find a way to stabilize 3 of 4 of your limbs. You can place a hand or elbow on your thigh or rest a knee on the surface you are reaching across. This will provide you more stability so that your muscles do not fatigue as quickly. By keeping your knees relaxed, or slightly bent, you will also reduce stress across your low back. CORRECT LIFTING TECHNIQUES DO :   Assume a wide stance. This will provide you more stability and the opportunity to get as close as possible to the object which you are lifting.  Tense your abdominals to brace your spine; then bend at the knees and hips. Keeping your back locked in a neutral-spine position, lift using your leg muscles. Lift with your legs, keeping your back straight.  Test the weight of unknown objects before attempting to lift them.  Try to keep your elbows locked down at your sides in order get the best strength from your shoulders when carrying an object.  Always ask for help when lifting heavy or awkward objects. INCORRECT LIFTING TECHNIQUES DO NOT:   Lock your knees when lifting, even if it is a small object.  Bend and twist. Pivot at your feet or  move your feet when needing to change directions.  Assume that you cannot safely pick up a paperclip without proper posture. Document Released: 05/31/2005 Document Revised: 08/23/2011 Document Reviewed: 09/12/2008 [facility] Patient Information 2014 [place], [place].

## 2013-09-28 NOTE — Progress Notes (Signed)
Chief Complaint  Patient presents with  . Annual Exam    HPI: Patient comes in today for Preventive Health Care visit  No major change in health status since last visit . Sciatica bothering her at times  Traveling   On lots of jets.   Travels  A good deal Ok when up and around no weakness or injury .Exercises  To do . ? Ok aleve.  After sitting car and  Aircrafts a problem  Walks 6  Miles  sciatica ok  Toe check riight second toe. Thickened  ? lamisil worked in past for other nails  Due for pap   Health Maintenance  Topic Date Due  . Pap Smear  08/25/2012  . Colonoscopy  12/23/2013  . Influenza Vaccine  01/12/2014  . Mammogram  07/03/2015  . Tetanus/tdap  06/28/2016  . Zostavax  Completed   Health Maintenance Review   ROS:  GEN/ HEENT: No fever, significant weight changes sweats headaches vision problems hearing changes, CV/ PULM; No chest pain shortness of breath cough, syncope,edema  change in exercise tolerance. GI /GU: No adominal pain, vomiting, change in bowel habits. No blood in the stool. No significant GU symptoms. SKIN/HEME: ,no acute skin rashes suspicious lesions or bleeding. No lymphadenopathy, nodules, masses.  NEURO/ PSYCH:  No neurologic signs such as weakness numbness. No depression anxiety. IMM/ Allergy: No unusual infections.  Allergy .   REST of 12 system review negative except as per HPI   Past Medical History  Diagnosis Date  . Rosacea   . Hx of skin cancer, basal cell   . Fibroid 2004    Per Dr. Ouida Sills  . Colon polyps 2005    on colonscopy Dr. Fuller Plan  . History of shingles     face and mouth  . CERVICAL POLYP 03/11/2008    Qualifier: Diagnosis of  By: Regis Bill MD, Standley Brooking     Family History  Problem Relation Age of Onset  . Hypertension Father   . Osteoporosis      History   Social History  . Marital Status: Married    Spouse Name: N/A    Number of Children: N/A  . Years of Education: N/A   Social History Main Topics  . Smoking  status: Never Smoker   . Smokeless tobacco: None  . Alcohol Use: Yes  . Drug Use: None  . Sexual Activity: None   Other Topics Concern  . None   Social History Narrative   Married   Spouse had CABG    [employer]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   hh of 2    2 cats          Outpatient Encounter Prescriptions as of 09/28/2013  Medication Sig  . Flaxseed, Linseed, (FLAX SEED OIL PO) Take 1 tablet by mouth daily.  Astrid Drafts CAPS Take 1 capsule by mouth daily.  . metroNIDAZOLE (METROGEL) 1 % gel Apply 1 application topically daily.  . Multiple Vitamins-Calcium (VIACTIV MULTI-VITAMIN) CHEW Chew by mouth.  . Multiple Vitamins-Minerals (CENTRUM SILVER ULTRA WOMENS PO) Take by mouth.  . Multiple Vitamins-Minerals (PRESERVISION/LUTEIN PO) Take by mouth.  . Omega-3 Fatty Acids (FISH OIL) 1200 MG CAPS Take 1 capsule by mouth daily.  Marland Kitchen sulfacetamide (BLEPH-10) 10 % ophthalmic solution Place 1 drop into the right eye every 3 (three) hours.  Marland Kitchen zolpidem (AMBIEN) 10 MG tablet Take 1 tablet (10 mg total) by mouth at bedtime as needed.  . terbinafine (  LAMISIL) 250 MG tablet Take 1 tablet (250 mg total) by mouth daily.    EXAM:  BP 120/74  Pulse 72  Temp(Src) 98.2 F (36.8 C) (Oral)  Ht 5' 5.5" (1.664 m)  Wt 149 lb (67.586 kg)  BMI 24.41 kg/m2  SpO2 99%  Body mass index is 24.41 kg/(m^2).  Physical Exam: Vital signs reviewed TAV:WPVX is a well-developed well-nourished alert cooperative    who appearsr stated age in no acute distress.  HEENT: normocephalic atraumatic , Eyes: PERRL EOM's full, conjunctiva clear, Nares: paten,t no deformity discharge or tenderness., Ears: no deformity EAC's clear TMs with normal landmarks. Mouth: clear OP, no lesions, edema.  Moist mucous membranes. Dentition in adequate repair. NECK: supple without masses, thyromegaly or bruits. CHEST/PULM:  Clear to auscultation and percussion breath sounds equal no wheeze , rales or rhonchi. No chest wall  deformities or tenderness. Breast: normal by inspection . No dimpling, discharge, masses, tenderness or discharge . CV: PMI is nondisplaced, S1 S2 no gallops, murmurs, rubs. Peripheral pulses are full without delay.No JVD .  ABDOMEN: Bowel sounds normal nontender  No guard or rebound, no hepato splenomegal no CVA tenderness.  No hernia. Extremtities:  No clubbing cyanosis or edema, no acute joint swelling or redness no focal atrophy mild lslr left 90 degress  NEURO:  Oriented x3, cranial nerves 3-12 appear to be intact, no obvious focal weakness,gait within normal limits no abnormal reflexes or asymmetrical SKIN: No acute rashes normal turgor, color, no bruising or petechiae.right sec toe nail discolored and thickened througout no redness skin  PSYCH: Oriented, good eye contact, no obvious depression anxiety, cognition and judgment appear normal. LN: no cervical axillary inguinal adenopathy Pelvic: NL ext GU, labia clear without lesions or rash . Vagina no lesions .Cervix: clear  UTERUS: Neg CMT Adnexa:  clear no masses . PAP hpv cotests  Rectal no masses heme neg   Lab Results  Component Value Date   WBC 4.6 09/28/2013   HGB 13.3 09/28/2013   HCT 39.9 09/28/2013   PLT 222.0 09/28/2013   GLUCOSE 86 09/28/2013   CHOL 258* 09/28/2013   TRIG 21.0 09/28/2013   HDL 87.00 09/28/2013   LDLDIRECT 162.3 03/21/2012   LDLCALC 167* 09/28/2013   ALT 17 09/28/2013   AST 29 09/28/2013   NA 143 09/28/2013   K 4.4 09/28/2013   CL 107 09/28/2013   CREATININE 0.6 09/28/2013   BUN 20 09/28/2013   CO2 29 09/28/2013   TSH 0.93 09/28/2013    ASSESSMENT AND PLAN:  Discussed the following assessment and plan:  Encounter for preventive health examination - Plan: Basic metabolic panel, CBC with Differential, Hepatic function panel, Lipid panel, TSH, PAP [[place]]  Encounter for routine gynecological examination - Plan: PAP [[place]]  HYPERLIPIDEMIA - Plan: Basic metabolic panel, Hepatic function panel, Lipid  panel  Sciatica of left side - exercises conservative therapy  Onychomycosis - r second toe  risk benefit of meds ok to start lamisil lfts to be done today  Patient Care Team: [name], MD as PCP - General Levi Aland, MD (Obstetrics and Gynecology) Meryl Dare, MD (Gastroenterology) [facility] Bjorn Loser, MD (Dermatology) [facility] [place] Madelaine Etienne, MD Patient Instructions  Will notify you  of labs when available. Continue lifestyle intervention healthy eating and exercise . Ok to do a trial of of lamisil.  Trial    For now as discucssed . Sciatica  Sometimes back exrcises help  alevel ok  Will notify you  of labs when available. Wellness visit in a year    Sciatica with Rehab The sciatic nerve runs from the back down the leg and is responsible for sensation and control of the muscles in the back (posterior) side of the thigh, lower leg, and foot. Sciatica is a condition that is characterized by inflammation of this nerve.  SYMPTOMS   Signs of nerve damage, including numbness and/or weakness along the posterior side of the lower extremity.  Pain in the back of the thigh that may also travel down the leg.  Pain that worsens when sitting for long periods of time.  Occasionally, pain in the back or buttock. CAUSES  Inflammation of the sciatic nerve is the cause of sciatica. The inflammation is due to something irritating the nerve. Common sources of irritation include:  Sitting for long periods of time.  Direct trauma to the nerve.  Arthritis of the spine.  Herniated or ruptured disk.  Slipping of the vertebrae (spondylolithesis)  Pressure from soft tissues, such as muscles or ligament-like tissue (fascia). RISK INCREASES WITH:  Sports that place pressure or stress on the spine (football or weightlifting).  Poor strength and flexibility.  Failure to warm-up properly before activity.  Family history of low back pain or disk disorders.  Previous back injury  or surgery.  Poor body mechanics, especially when lifting, or poor posture. PREVENTION   Warm up and stretch properly before activity.  Maintain physical fitness:  Strength, flexibility, and endurance.  Cardiovascular fitness.  Learn and use proper technique, especially with posture and lifting. When possible, have coach correct improper technique.  Avoid activities that place stress on the spine. PROGNOSIS If treated properly, then sciatica usually resolves within 6 weeks. However, occasionally surgery is necessary.  RELATED COMPLICATIONS   Permanent nerve damage, including pain, numbness, tingle, or weakness.  Chronic back pain.  Risks of surgery: infection, bleeding, nerve damage, or damage to surrounding tissues. TREATMENT Treatment initially involves resting from any activities that aggravate your symptoms. The use of ice and medication may help reduce pain and inflammation. The use of strengthening and stretching exercises may help reduce pain with activity. These exercises may be performed at home or with referral to a therapist. A therapist may recommend further treatments, such as transcutaneous electronic nerve stimulation (TENS) or ultrasound. Your caregiver may recommend corticosteroid injections to help reduce inflammation of the sciatic nerve. If symptoms persist despite non-surgical (conservative) treatment, then surgery may be recommended. MEDICATION  If pain medication is necessary, then nonsteroidal anti-inflammatory medications, such as aspirin and ibuprofen, or other minor pain relievers, such as acetaminophen, are often recommended.  Do not take pain medication for 7 days before surgery.  Prescription pain relievers may be given if deemed necessary by your caregiver. Use only as directed and only as much as you need.  Ointments applied to the skin may be helpful.  Corticosteroid injections may be given by your caregiver. These injections should be reserved  for the most serious cases, because they may only be given a certain number of times. HEAT AND COLD  Cold treatment (icing) relieves pain and reduces inflammation. Cold treatment should be applied for 10 to 15 minutes every 2 to 3 hours for inflammation and pain and immediately after any activity that aggravates your symptoms. Use ice packs or massage the area with a piece of ice (ice massage).  Heat treatment may be used prior to performing the stretching and strengthening activities prescribed by your caregiver, physical therapist, or athletic  trainer. Use a heat pack or soak the injury in warm water. SEEK MEDICAL CARE IF:  Treatment seems to offer no benefit, or the condition worsens.  Any medications produce adverse side effects. EXERCISES  RANGE OF MOTION (ROM) AND STRETCHING EXERCISES - Sciatica Most people with sciatic will find that their symptoms worsen with either excessive bending forward (flexion) or arching at the low back (extension). The exercises which will help resolve your symptoms will focus on the opposite motion. Your physician, physical therapist or athletic trainer will help you determine which exercises will be most helpful to resolve your low back pain. Do not complete any exercises without first consulting with your clinician. Discontinue any exercises which worsen your symptoms until you speak to your clinician. If you have pain, numbness or tingling which travels down into your buttocks, leg or foot, the goal of the therapy is for these symptoms to move closer to your back and eventually resolve. Occasionally, these leg symptoms will get better, but your low back pain may worsen; this is typically an indication of progress in your rehabilitation. Be certain to be very alert to any changes in your symptoms and the activities in which you participated in the 24 hours prior to the change. Sharing this information with your clinician will allow him/her to most efficiently treat  your condition. These exercises may help you when beginning to rehabilitate your injury. Your symptoms may resolve with or without further involvement from your physician, physical therapist or athletic trainer. While completing these exercises, remember:   Restoring tissue flexibility helps normal motion to return to the joints. This allows healthier, less painful movement and activity.  An effective stretch should be held for at least 30 seconds.  A stretch should never be painful. You should only feel a gentle lengthening or release in the stretched tissue. FLEXION RANGE OF MOTION AND STRETCHING EXERCISES: STRETCH  Flexion, Single Knee to Chest   Lie on a firm bed or floor with both legs extended in front of you.  Keeping one leg in contact with the floor, bring your opposite knee to your chest. Hold your leg in place by either grabbing behind your thigh or at your knee.  Pull until you feel a gentle stretch in your low back. Hold __________ seconds.  Slowly release your grasp and repeat the exercise with the opposite side. Repeat __________ times. Complete this exercise __________ times per day.  STRETCH  Flexion, Double Knee to Chest  Lie on a firm bed or floor with both legs extended in front of you.  Keeping one leg in contact with the floor, bring your opposite knee to your chest.  Tense your stomach muscles to support your back and then lift your other knee to your chest. Hold your legs in place by either grabbing behind your thighs or at your knees.  Pull both knees toward your chest until you feel a gentle stretch in your low back. Hold __________ seconds.  Tense your stomach muscles and slowly return one leg at a time to the floor. Repeat __________ times. Complete this exercise __________ times per day.  STRETCH  Low Trunk Rotation   Lie on a firm bed or floor. Keeping your legs in front of you, bend your knees so they are both pointed toward the ceiling and your feet  are flat on the floor.  Extend your arms out to the side. This will stabilize your upper body by keeping your shoulders in contact with the floor.  Gently and slowly drop both knees together to one side until you feel a gentle stretch in your low back. Hold for __________ seconds.  Tense your stomach muscles to support your low back as you bring your knees back to the starting position. Repeat the exercise to the other side. Repeat __________ times. Complete this exercise __________ times per day  EXTENSION RANGE OF MOTION AND FLEXIBILITY EXERCISES: STRETCH  Extension, Prone on Elbows  Lie on your stomach on the floor, a bed will be too soft. Place your palms about shoulder width apart and at the height of your head.  Place your elbows under your shoulders. If this is too painful, stack pillows under your chest.  Allow your body to relax so that your hips drop lower and make contact more completely with the floor.  Hold this position for __________ seconds.  Slowly return to lying flat on the floor. Repeat __________ times. Complete this exercise __________ times per day.  RANGE OF MOTION  Extension, Prone Press Ups  Lie on your stomach on the floor, a bed will be too soft. Place your palms about shoulder width apart and at the height of your head.  Keeping your back as relaxed as possible, slowly straighten your elbows while keeping your hips on the floor. You may adjust the placement of your hands to maximize your comfort. As you gain motion, your hands will come more underneath your shoulders.  Hold this position __________ seconds.  Slowly return to lying flat on the floor. Repeat __________ times. Complete this exercise __________ times per day.  STRENGTHENING EXERCISES - Sciatica  These exercises may help you when beginning to rehabilitate your injury. These exercises should be done near your "sweet spot." This is the neutral, low-back arch, somewhere between fully rounded and  fully arched, that is your least painful position. When performed in this safe range of motion, these exercises can be used for people who have either a flexion or extension based injury. These exercises may resolve your symptoms with or without further involvement from your physician, physical therapist or athletic trainer. While completing these exercises, remember:   Muscles can gain both the endurance and the strength needed for everyday activities through controlled exercises.  Complete these exercises as instructed by your physician, physical therapist or athletic trainer. Progress with the resistance and repetition exercises only as your caregiver advises.  You may experience muscle soreness or fatigue, but the pain or discomfort you are trying to eliminate should never worsen during these exercises. If this pain does worsen, stop and make certain you are following the directions exactly. If the pain is still present after adjustments, discontinue the exercise until you can discuss the trouble with your clinician. STRENGTHENING Deep Abdominals, Pelvic Tilt   Lie on a firm bed or floor. Keeping your legs in front of you, bend your knees so they are both pointed toward the ceiling and your feet are flat on the floor.  Tense your lower abdominal muscles to press your low back into the floor. This motion will rotate your pelvis so that your tail bone is scooping upwards rather than pointing at your feet or into the floor.  With a gentle tension and even breathing, hold this position for __________ seconds. Repeat __________ times. Complete this exercise __________ times per day.  STRENGTHENING  Abdominals, Crunches   Lie on a firm bed or floor. Keeping your legs in front of you, bend your knees so they are both pointed toward  the ceiling and your feet are flat on the floor. Cross your arms over your chest.  Slightly tip your chin down without bending your neck.  Tense your abdominals and  slowly lift your trunk high enough to just clear your shoulder blades. Lifting higher can put excessive stress on the low back and does not further strengthen your abdominal muscles.  Control your return to the starting position. Repeat __________ times. Complete this exercise __________ times per day.  STRENGTHENING  Quadruped, Opposite UE/LE Lift  Assume a hands and knees position on a firm surface. Keep your hands under your shoulders and your knees under your hips. You may place padding under your knees for comfort.  Find your neutral spine and gently tense your abdominal muscles so that you can maintain this position. Your shoulders and hips should form a rectangle that is parallel with the floor and is not twisted.  Keeping your trunk steady, lift your right hand no higher than your shoulder and then your left leg no higher than your hip. Make sure you are not holding your breath. Hold this position __________ seconds.  Continuing to keep your abdominal muscles tense and your back steady, slowly return to your starting position. Repeat with the opposite arm and leg. Repeat __________ times. Complete this exercise __________ times per day.  STRENGTHENING  Abdominals and Quadriceps, Straight Leg Raise   Lie on a firm bed or floor with both legs extended in front of you.  Keeping one leg in contact with the floor, bend the other knee so that your foot can rest flat on the floor.  Find your neutral spine, and tense your abdominal muscles to maintain your spinal position throughout the exercise.  Slowly lift your straight leg off the floor about 6 inches for a count of 15, making sure to not hold your breath.  Still keeping your neutral spine, slowly lower your leg all the way to the floor. Repeat this exercise with each leg __________ times. Complete this exercise __________ times per day. POSTURE AND BODY MECHANICS CONSIDERATIONS - Sciatica Keeping correct posture when sitting, standing  or completing your activities will reduce the stress put on different body tissues, allowing injured tissues a chance to heal and limiting painful experiences. The following are general guidelines for improved posture. Your physician or physical therapist will provide you with any instructions specific to your needs. While reading these guidelines, remember:  The exercises prescribed by your provider will help you have the flexibility and strength to maintain correct postures.  The correct posture provides the optimal environment for your joints to work. All of your joints have less wear and tear when properly supported by a spine with good posture. This means you will experience a healthier, less painful body.  Correct posture must be practiced with all of your activities, especially prolonged sitting and standing. Correct posture is as important when doing repetitive low-stress activities (typing) as it is when doing a single heavy-load activity (lifting). RESTING POSITIONS Consider which positions are most painful for you when choosing a resting position. If you have pain with flexion-based activities (sitting, bending, stooping, squatting), choose a position that allows you to rest in a less flexed posture. You would want to avoid curling into a fetal position on your side. If your pain worsens with extension-based activities (prolonged standing, working overhead), avoid resting in an extended position such as sleeping on your stomach. Most people will find more comfort when they rest with their spine in  a more neutral position, neither too rounded nor too arched. Lying on a non-sagging bed on your side with a pillow between your knees, or on your back with a pillow under your knees will often provide some relief. Keep in mind, being in any one position for a prolonged period of time, no matter how correct your posture, can still lead to stiffness. PROPER SITTING POSTURE In order to minimize stress and  discomfort on your spine, you must sit with correct posture Sitting with good posture should be effortless for a healthy body. Returning to good posture is a gradual process. Many people can work toward this most comfortably by using various supports until they have the flexibility and strength to maintain this posture on their own. When sitting with proper posture, your ears will fall over your shoulders and your shoulders will fall over your hips. You should use the back of the chair to support your upper back. Your low back will be in a neutral position, just slightly arched. You may place a small pillow or folded towel at the base of your low back for support.  When working at a desk, create an environment that supports good, upright posture. Without extra support, muscles fatigue and lead to excessive strain on joints and other tissues. Keep these recommendations in mind: CHAIR:   A chair should be able to slide under your desk when your back makes contact with the back of the chair. This allows you to work closely.  The chair's height should allow your eyes to be level with the upper part of your monitor and your hands to be slightly lower than your elbows. BODY POSITION  Your feet should make contact with the floor. If this is not possible, use a foot rest.  Keep your ears over your shoulders. This will reduce stress on your neck and low back. INCORRECT SITTING POSTURES   If you are feeling tired and unable to assume a healthy sitting posture, do not slouch or slump. This puts excessive strain on your back tissues, causing more damage and pain. Healthier options include:  Using more support, like a lumbar pillow.  Switching tasks to something that requires you to be upright or walking.  Talking a brief walk.  Lying down to rest in a neutral-spine position. PROLONGED STANDING WHILE SLIGHTLY LEANING FORWARD  When completing a task that requires you to lean forward while standing in one  place for a long time, place either foot up on a stationary 2-4 inch high object to help maintain the best posture. When both feet are on the ground, the low back tends to lose its slight inward curve. If this curve flattens (or becomes too large), then the back and your other joints will experience too much stress, fatigue more quickly and can cause pain.  CORRECT STANDING POSTURES Proper standing posture should be assumed with all daily activities, even if they only take a few moments, like when brushing your teeth. As in sitting, your ears should fall over your shoulders and your shoulders should fall over your hips. You should keep a slight tension in your abdominal muscles to brace your spine. Your tailbone should point down to the ground, not behind your body, resulting in an over-extended swayback posture.  INCORRECT STANDING POSTURES  Common incorrect standing postures include a forward head, locked knees and/or an excessive swayback. WALKING Walk with an upright posture. Your ears, shoulders and hips should all line-up. PROLONGED ACTIVITY IN A FLEXED POSITION When  completing a task that requires you to bend forward at your waist or lean over a low surface, try to find a way to stabilize 3 of 4 of your limbs. You can place a hand or elbow on your thigh or rest a knee on the surface you are reaching across. This will provide you more stability so that your muscles do not fatigue as quickly. By keeping your knees relaxed, or slightly bent, you will also reduce stress across your low back. CORRECT LIFTING TECHNIQUES DO :   Assume a wide stance. This will provide you more stability and the opportunity to get as close as possible to the object which you are lifting.  Tense your abdominals to brace your spine; then bend at the knees and hips. Keeping your back locked in a neutral-spine position, lift using your leg muscles. Lift with your legs, keeping your back straight.  Test the weight of  unknown objects before attempting to lift them.  Try to keep your elbows locked down at your sides in order get the best strength from your shoulders when carrying an object.  Always ask for help when lifting heavy or awkward objects. INCORRECT LIFTING TECHNIQUES DO NOT:   Lock your knees when lifting, even if it is a small object.  Bend and twist. Pivot at your feet or move your feet when needing to change directions.  Assume that you cannot safely pick up a paperclip without proper posture. Document Released: 05/31/2005 Document Revised: 08/23/2011 Document Reviewed: 09/12/2008 [facility] Patient Information 2014 [place], [place].     Standley Brooking. Arriyah Madej M.D.    Pre visit review using our clinic review tool, if applicable. No additional management support is needed unless otherwise documented below in the visit note.

## 2013-10-01 ENCOUNTER — Telehealth: Payer: Self-pay | Admitting: Internal Medicine

## 2013-10-01 ENCOUNTER — Encounter: Payer: Self-pay | Admitting: Family Medicine

## 2013-10-01 DIAGNOSIS — Z136 Encounter for screening for cardiovascular disorders: Secondary | ICD-10-CM

## 2013-10-01 DIAGNOSIS — E785 Hyperlipidemia, unspecified: Secondary | ICD-10-CM

## 2013-10-01 DIAGNOSIS — G479 Sleep disorder, unspecified: Secondary | ICD-10-CM

## 2013-10-01 DIAGNOSIS — M21969 Unspecified acquired deformity of unspecified lower leg: Secondary | ICD-10-CM

## 2013-10-01 DIAGNOSIS — Z0000 Encounter for general adult medical examination without abnormal findings: Secondary | ICD-10-CM

## 2013-10-01 DIAGNOSIS — Z23 Encounter for immunization: Secondary | ICD-10-CM

## 2013-10-01 DIAGNOSIS — L719 Rosacea, unspecified: Secondary | ICD-10-CM

## 2013-10-01 DIAGNOSIS — Z85828 Personal history of other malignant neoplasm of skin: Secondary | ICD-10-CM

## 2013-10-01 MED ORDER — ZOLPIDEM TARTRATE 10 MG PO TABS: 10.0000 mg | ORAL_TABLET | Freq: Every evening | ORAL | Status: DC | PRN

## 2013-10-01 MED ORDER — METRONIDAZOLE 1 % EX GEL: 1.0000 "application " | Freq: Every day | CUTANEOUS | Status: DC

## 2013-10-01 NOTE — Progress Notes (Signed)
Quick Note:  Tell patient PAP is normal. HPV high risk is negative ______ 

## 2013-10-01 NOTE — Telephone Encounter (Signed)
Pt needs refill on ambien 10 mg and metrogel call [facility]

## 2013-10-01 NOTE — Telephone Encounter (Signed)
Pt notified that prescriptions sent to the pharmacy.

## 2013-10-01 NOTE — Telephone Encounter (Signed)
Ok to refill the [place] 30 with 1 additional refill ( uses for travel) Refill metrogel for 1 year

## 2013-10-08 ENCOUNTER — Encounter: Payer: Self-pay | Admitting: Gastroenterology

## 2013-10-29 ENCOUNTER — Telehealth: Payer: Self-pay | Admitting: Internal Medicine

## 2013-10-29 DIAGNOSIS — M5430 Sciatica, unspecified side: Secondary | ICD-10-CM

## 2013-10-29 NOTE — Telephone Encounter (Signed)
Please refer to ortho or s[ports medicine

## 2013-10-29 NOTE — Telephone Encounter (Signed)
Pt needs a referral to see ?orthopedic?,neurologist pt saw dr Regis Bill on 4-17 and discuss tingling in toes etc. Pt would like to see referral specialist tomorrow.Pt stated she is having some sciatica issues

## 2013-10-29 NOTE — Telephone Encounter (Signed)
Referral placed in the system. 

## 2013-11-20 ENCOUNTER — Telehealth: Payer: Self-pay | Admitting: Cardiology

## 2013-11-20 NOTE — Telephone Encounter (Signed)
New Message:  Pt's husband is a pt of Dr. Marlou Porch.However, his wife is not.. States his wife is in [place] having CP and sweating. She will be back in town on Thursday. He wants her to be seen asap by Dr. Marlou Porch as a work in. I informed Mr. Padin that Dr. Marlou Porch is out of the office. He is requesting a call back from the nurse about his wife and that she be worked in... The soonest appt with any of our doctors is in July.. Mr. Kees said that was unacceptable. I also informed Mr. Mcwethy that per our office policy, any new patients experiencing concerning symptoms are encouraged to call 911 or go to their local ER. Mr Kisner is requesting to speak to Dr. Marlou Porch.

## 2013-11-20 NOTE — Telephone Encounter (Signed)
Spoke with [name] and explained that [name] is on vacation this week and not available to speak with. He wanted her to fly home tomorrow and be seen by another provider on Thursday. Also advised that with his wife's symptoms she needs to go to the closest ER for an immediate evaluation. He was able to get her on the phone and transferred to me to speak with. She states that 5 hours ago she had an episode of severe chest pain with diaphoresis (soaked thru her shirt) that lasted about 10 minutes. States that she feels fine now. I strongly advised that she proceed to the nearest ER in [place], [place]. For an evaluation. She states that she will follow these instructions.  Will let [name] know about this phone call.

## 2013-11-22 ENCOUNTER — Other Ambulatory Visit: Payer: Self-pay | Admitting: Dermatology

## 2013-11-28 NOTE — Telephone Encounter (Signed)
Agree with plan. Needs to be seen in ER at her present location as encouraged. Thanks for update. Will be happy to see her when she is in town.  Candee Furbish, MD

## 2013-11-29 NOTE — Telephone Encounter (Signed)
I spoke with him. She did go to [facility] [employer]. [facility]. She was diagnosed with a peptic ulcer. She is feeling better. I be happy to see her in the future if any symptoms became more worrisome.  Candee Furbish, MD

## 2014-10-07 ENCOUNTER — Telehealth: Payer: Self-pay | Admitting: Internal Medicine

## 2014-10-07 NOTE — Telephone Encounter (Signed)
Pt said she moved away and will be in town on 12/12/14 and is asking if Dr Regis Bill will see her for a physical. If not that day what about  01/01/15

## 2014-10-08 NOTE — Telephone Encounter (Signed)
S/w pt and she has been scheduled

## 2014-10-08 NOTE — Telephone Encounter (Signed)
Ok to add on cpx for Thursday June 30th

## 2014-10-11 NOTE — Telephone Encounter (Signed)
error 

## 2014-11-12 ENCOUNTER — Other Ambulatory Visit: Payer: Self-pay

## 2014-11-12 ENCOUNTER — Other Ambulatory Visit: Payer: Self-pay | Admitting: Internal Medicine

## 2014-11-12 DIAGNOSIS — Z1231 Encounter for screening mammogram for malignant neoplasm of breast: Secondary | ICD-10-CM

## 2014-11-12 DIAGNOSIS — Z1239 Encounter for other screening for malignant neoplasm of breast: Secondary | ICD-10-CM

## 2014-12-12 ENCOUNTER — Ambulatory Visit (INDEPENDENT_AMBULATORY_CARE_PROVIDER_SITE_OTHER): Payer: BC Managed Care – PPO | Admitting: Internal Medicine

## 2014-12-12 ENCOUNTER — Ambulatory Visit (HOSPITAL_COMMUNITY)
Admission: RE | Admit: 2014-12-12 | Discharge: 2014-12-12 | Disposition: A | Discharge status: Home / Self Care | Payer: BC Managed Care – PPO | Source: Ambulatory Visit | Attending: Internal Medicine | Admitting: Internal Medicine

## 2014-12-12 ENCOUNTER — Encounter: Payer: Self-pay | Admitting: Internal Medicine

## 2014-12-12 ENCOUNTER — Ambulatory Visit: Payer: BC Managed Care – PPO

## 2014-12-12 VITALS — BP 122/72 | Temp 98.2°F | Ht 64.5 in | Wt 146.8 lb

## 2014-12-12 DIAGNOSIS — E785 Hyperlipidemia, unspecified: Secondary | ICD-10-CM | POA: Diagnosis not present

## 2014-12-12 DIAGNOSIS — Z1231 Encounter for screening mammogram for malignant neoplasm of breast: Secondary | ICD-10-CM | POA: Diagnosis not present

## 2014-12-12 DIAGNOSIS — Z0000 Encounter for general adult medical examination without abnormal findings: Secondary | ICD-10-CM

## 2014-12-12 LAB — HEPATIC FUNCTION PANEL
ALBUMIN: 4.6 g/dL (ref 3.5–5.2)
ALK PHOS: 63 U/L (ref 39–117)
ALT: 20 U/L (ref 0–35)
AST: 28 U/L (ref 0–37)
BILIRUBIN TOTAL: 0.7 mg/dL (ref 0.2–1.2)
Bilirubin, Direct: 0.1 mg/dL (ref 0.0–0.3)
Total Protein: 7.5 g/dL (ref 6.0–8.3)

## 2014-12-12 LAB — CBC WITH DIFFERENTIAL/PLATELET
BASOS ABS: 0 10*3/uL (ref 0.0–0.1)
Basophils Relative: 0.7 % (ref 0.0–3.0)
EOS ABS: 0.1 10*3/uL (ref 0.0–0.7)
EOS PCT: 2 % (ref 0.0–5.0)
HCT: 41.3 % (ref 36.0–46.0)
Hemoglobin: 14 g/dL (ref 12.0–15.0)
Lymphocytes Relative: 30.6 % (ref 12.0–46.0)
Lymphs Abs: 1.3 10*3/uL (ref 0.7–4.0)
MCHC: 34 g/dL (ref 30.0–36.0)
MCV: 93.4 fl (ref 78.0–100.0)
Monocytes Absolute: 0.3 10*3/uL (ref 0.1–1.0)
Monocytes Relative: 7.2 % (ref 3.0–12.0)
Neutro Abs: 2.6 10*3/uL (ref 1.4–7.7)
Neutrophils Relative %: 59.5 % (ref 43.0–77.0)
PLATELETS: 213 10*3/uL (ref 150.0–400.0)
RBC: 4.42 Mil/uL (ref 3.87–5.11)
RDW: 14 % (ref 11.5–15.5)
WBC: 4.4 10*3/uL (ref 4.0–10.5)

## 2014-12-12 LAB — LIPID PANEL
CHOLESTEROL: 253 mg/dL — AB (ref 0–200)
HDL: 75.2 mg/dL (ref >39.00)
LDL CALC: 167 mg/dL — AB (ref 0–99)
NonHDL: 177.8
TRIGLYCERIDES: 54 mg/dL (ref 0.0–149.0)
Total CHOL/HDL Ratio: 3
VLDL: 10.8 mg/dL (ref 0.0–40.0)

## 2014-12-12 LAB — BASIC METABOLIC PANEL
BUN: 18 mg/dL (ref 6–23)
CALCIUM: 10.2 mg/dL (ref 8.4–10.5)
CO2: 31 mEq/L (ref 19–32)
Chloride: 107 mEq/L (ref 96–112)
Creatinine, Ser: 0.65 mg/dL (ref 0.40–1.20)
GFR: 97.71 mL/min (ref >60.00)
Glucose, Bld: 90 mg/dL (ref 70–99)
Potassium: 5.4 mEq/L — ABNORMAL HIGH (ref 3.5–5.1)
Sodium: 145 mEq/L (ref 135–145)

## 2014-12-12 LAB — TSH: TSH: 0.89 u[IU]/mL (ref 0.35–4.50)

## 2014-12-12 MED ORDER — ZOLPIDEM TARTRATE 10 MG PO TABS: 5.0000 mg | ORAL_TABLET | Freq: Every evening | ORAL | Status: DC | PRN

## 2014-12-12 NOTE — Progress Notes (Signed)
Pre visit review using our clinic review tool, if applicable. No additional management support is needed unless otherwise documented below in the visit note.  Chief Complaint  Patient presents with  . Annual Exam    HPI: [name] 64 y.o. comes in today for Preventive Medicare wellness visit .  Now living in [place]  Is well but had episode last may with sever sweats evaluated at [facility] r/o cardiac issues  Ct scan done felt poss from [facility]  Has never had recurrence  Medial foot numb  Hx of bunion surgery also  No stocking glove  Sx or  Pain exercised walking   Was told has scoliosis when had  MRI  Had pt   Hx of episode of back pain /radiation .  Has traveled had hep a and typhoid oral   To get mammo and dentist check today   Health Maintenance  Topic Date Due  . COLONOSCOPY  12/23/2013  . HIV Screening  11/13/2015 (Originally 06/30/1965)  . INFLUENZA VACCINE  01/13/2015  . MAMMOGRAM  07/03/2015  . TETANUS/TDAP  06/28/2016  . PAP SMEAR  09/28/2016  . ZOSTAVAX  Completed   Health Maintenance Review LIFESTYLE:  Exercise:  Yes  Tobacco/ETS:no Alcohol: per week 6  Sugar beverages: Sleep:8 hours  Drug use: no  ROS: see hpi  GEN/ HEENT: No fever, significant weight changes sweats headaches vision problems hearing changes, CV/ PULM; No chest pain shortness of breath cough, syncope,edema  change in exercise tolerance. GI /GU: No adominal pain, vomiting, change in bowel habits. No blood in the stool. No significant GU symptoms. SKIN/HEME: ,no acute skin rashes suspicious lesions or bleeding. No lymphadenopathy, nodules, masses.  NEURO/ PSYCH:  No neurologic signs such as weakness numbness. No depression anxiety. IMM/ Allergy: No unusual infections.  Allergy .   REST of 12 system review negative except as per HPI   Past Medical History  Diagnosis Date  . Rosacea   . Hx of skin cancer, basal cell   . Fibroid 2004    Per Dr. Ouida Sills  . Colon polyps 2005      on colonscopy Dr. Fuller Plan  . History of shingles     face and mouth  . CERVICAL POLYP 03/11/2008    Qualifier: Diagnosis of  By: Regis Bill MD, Standley Brooking   . Scoliosis     noted on mri done for back pain    Family History  Problem Relation Age of Onset  . Hypertension Father   . Osteoporosis      History   Social History  . Marital Status: Married    Spouse Name: N/A  . Number of Children: N/A  . Years of Education: N/A   Social History Main Topics  . Smoking status: Never Smoker   . Smokeless tobacco: Not on file  . Alcohol Use: Yes  . Drug Use: Not on file  . Sexual Activity: Not on file   Other Topics Concern  . None   Social History Narrative   Married   Spouse had CABG    [employer]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   Has moved to [place]   hh of 2    2 cats          Outpatient Encounter Prescriptions as of 12/12/2014  Medication Sig  . Flaxseed, Linseed, (FLAX SEED OIL PO) Take 1 tablet by mouth daily.  Astrid Drafts CAPS Take 1 capsule by mouth daily.  Marland Kitchen  metroNIDAZOLE (METROGEL) 1 % gel Apply 1 application topically daily.  . Multiple Vitamins-Calcium (VIACTIV MULTI-VITAMIN) CHEW Chew by mouth.  . Multiple Vitamins-Minerals (CENTRUM SILVER ULTRA WOMENS PO) Take by mouth.  . Multiple Vitamins-Minerals (PRESERVISION/LUTEIN PO) Take by mouth.  . Omega-3 Fatty Acids (FISH OIL) 1200 MG CAPS Take 1 capsule by mouth daily.  . [DISCONTINUED] zolpidem (AMBIEN) 10 MG tablet Take 1 tablet (10 mg total) by mouth at bedtime as needed.  . zolpidem (AMBIEN) 10 MG tablet Take 0.5-1 tablets (5-10 mg total) by mouth at bedtime as needed for sleep (avoid regular use .).  . [DISCONTINUED] sulfacetamide (BLEPH-10) 10 % ophthalmic solution Place 1 drop into the right eye every 3 (three) hours.  . [DISCONTINUED] terbinafine (LAMISIL) 250 MG tablet Take 1 tablet (250 mg total) by mouth daily.   No facility-administered encounter medications on file as of 12/12/2014.    EXAM:  BP  122/72 mmHg  Temp(Src) 98.2 F (36.8 C) (Oral)  Ht 5' 4.5" (1.638 m)  Wt 146 lb 12.8 oz (66.588 kg)  BMI 24.82 kg/m2  Body mass index is 24.82 kg/(m^2).  Physical Exam: Vital signs reviewed [id number] is a well-developed well-nourished alert cooperative   who appears stated age in no acute distress.  HEENT: normocephalic atraumatic , Eyes: PERRL EOM's full, conjunctiva clear, Nares: paten,t no deformity discharge or tenderness., Ears: no deformity EAC's clear TMs with normal landmarks. Mouth: clear OP, no lesions, edema.  Moist mucous membranes. Dentition in adequate repair. NECK: supple without masses, thyromegaly or bruits. CHEST/PULM:  Clear to auscultation and percussion breath sounds equal no wheeze , rales or rhonchi. No chest wall deformities or tenderness. CV: PMI is nondisplaced, S1 S2 no gallops, murmurs, rubs. Peripheral pulses are full without delay.No JVD . Breast: normal by inspection . No dimpling, discharge, masses, tenderness or discharge . ABDOMEN: Bowel sounds normal nontender  No guard or rebound, no hepato splenomegal no CVA tenderness. . Extremtities:  No clubbing cyanosis or edema, no acute joint swelling or redness no focal atrophy NEURO:  Oriented x3, cranial nerves 3-12 appear to be intact, no obvious focal weakness,gait within normal limits no abnormal reflexes or asymmetrical sub dec sense medial ard foot near great toe mtp joint healed scar  SKIN: No acute rashes normal turgor, color, no bruising or petechiae. PSYCH: Oriented, good eye contact, no obvious depression anxiety, cognition and judgment appear normal. LN: no cervical axillary inguinal adenopathy No noted deficits in memory, attention, and speech.   Health Maintenance Due  Topic Date Due  . COLONOSCOPY  12/23/2013    ASSESSMENT AND PLAN:  Discussed the following assessment and plan:  Visit for preventive health examination - Plan: Lipid panel, Basic metabolic panel, CBC with  Differential/Platelet, Hepatic function panel, TSH  Hyperlipidemia - Plan: Lipid panel, Basic metabolic panel, CBC with Differential/Platelet, Hepatic function panel, TSH Lab  Monitoring  Reviewed findings  Counseled. about healthy lifestyle   Patient Care Team: Burnis Medin, MD as PCP - General Olga Millers, MD (Obstetrics and Gynecology) Ladene Artist, MD (Gastroenterology) Crista Luria, MD (Dermatology) Jess Barters Dia Crawford, MD  Patient Instructions  Continue lifestyle intervention healthy eating and exercise .  Healthy lifestyle includes : At least 150 minutes of exercise weeks  , weight at healthy levels, which is usually   BMI 19-25. Avoid trans fats and processed foods;  Increase fresh fruits and veges to 5 servings per day. And avoid sweet beverages including tea and juice. Mediterranean diet with olive oil  and nuts have been noted to be heart and brain healthy . Avoid tobacco products . Limit  alcohol to  7 per week for women and 14 servings for men.  Get adequate sleep . Wear seat belts . Don't text and drive .   consdier seeing sports medicine  Dr Charlann Boxer or dr Eustace Moore fields about the foot and scolioses and back issues . May be helpful to check gait and  Shoes.  Will notify you  of labs when available.  Get the records from the [facility]  Evaluation for future reference here and for yourself.    Standley Brooking. Panosh M.D.

## 2014-12-12 NOTE — Patient Instructions (Signed)
Continue lifestyle intervention healthy eating and exercise .  Healthy lifestyle includes : At least 150 minutes of exercise weeks  , weight at healthy levels, which is usually   BMI 19-25. Avoid trans fats and processed foods;  Increase fresh fruits and veges to 5 servings per day. And avoid sweet beverages including tea and juice. Mediterranean diet with olive oil and nuts have been noted to be heart and brain healthy . Avoid tobacco products . Limit  alcohol to  7 per week for women and 14 servings for men.  Get adequate sleep . Wear seat belts . Don't text and drive .   consdier seeing sports medicine  Dr Charlann Boxer or dr Eustace Moore fields about the foot and scolioses and back issues . May be helpful to check gait and  Shoes.  Will notify you  of labs when available.  Get the records from the [facility]  Evaluation for future reference here and for yourself.

## 2014-12-13 ENCOUNTER — Encounter: Payer: Self-pay | Admitting: Internal Medicine

## 2015-01-06 ENCOUNTER — Other Ambulatory Visit: Payer: Self-pay | Admitting: Internal Medicine

## 2015-01-06 DIAGNOSIS — Z0000 Encounter for general adult medical examination without abnormal findings: Secondary | ICD-10-CM

## 2015-01-06 DIAGNOSIS — Z85828 Personal history of other malignant neoplasm of skin: Secondary | ICD-10-CM

## 2015-01-06 DIAGNOSIS — Z23 Encounter for immunization: Secondary | ICD-10-CM

## 2015-01-06 DIAGNOSIS — Z136 Encounter for screening for cardiovascular disorders: Secondary | ICD-10-CM

## 2015-01-06 DIAGNOSIS — L719 Rosacea, unspecified: Secondary | ICD-10-CM

## 2015-01-06 DIAGNOSIS — G479 Sleep disorder, unspecified: Secondary | ICD-10-CM

## 2015-01-06 NOTE — Telephone Encounter (Signed)
Pt request refill of the following: metroNIDAZOLE (METROGEL) 1 % gel   Pt sid she need this rx sent to the below pharmacy    [facility] [place]

## 2015-01-06 NOTE — Telephone Encounter (Signed)
[facility] [address].  Need the address.  Does she need just one month sent out of town?

## 2015-01-08 MED ORDER — METRONIDAZOLE 1 % EX GEL: 1.0000 "application " | Freq: Every day | CUTANEOUS | Status: DC

## 2015-01-08 NOTE — Telephone Encounter (Signed)
Sent to the pharmacy by e-scribe. 

## 2015-01-08 NOTE — Telephone Encounter (Signed)
[facility] is [facility] [place] RE[facility]LLS FOR 1 YEAR

## 2015-01-16 ENCOUNTER — Other Ambulatory Visit: Payer: Self-pay | Admitting: Internal Medicine

## 2015-01-16 NOTE — Telephone Encounter (Signed)
Request is a duplicate.  Filled on 01/08/15

## 2015-01-16 NOTE — Telephone Encounter (Signed)
Called and spoke to the pharmacy.  They did not receive the rx on 01/08/15.  Re sent electronically.

## 2015-07-07 ENCOUNTER — Other Ambulatory Visit: Payer: Self-pay | Admitting: Internal Medicine

## 2015-07-09 NOTE — Telephone Encounter (Signed)
Call in #30 with no rf  

## 2015-07-10 NOTE — Telephone Encounter (Signed)
Called to the pharmacy and left on machine. 

## 2015-07-14 ENCOUNTER — Telehealth: Payer: Self-pay | Admitting: Internal Medicine

## 2015-07-14 NOTE — Telephone Encounter (Signed)
Pt needs a new rx refill on zolipem 10 mg #15 w/refills [facility] [place]

## 2015-07-14 NOTE — Telephone Encounter (Signed)
Spoke to the pharmacy.  They had her under the last name Friend.  I called in in under [name].  They were unable to find the pt.  Prescription was never processed.  Called in again under Friend.  Called the pt and left her a message to change last name there or here.  Last names need to match.

## 2015-07-18 ENCOUNTER — Other Ambulatory Visit: Payer: Self-pay | Admitting: Internal Medicine

## 2015-07-18 NOTE — Telephone Encounter (Signed)
CALLED IN ON 07/11/15.  PT IS [name] Jan 28, 2051

## 2015-07-21 ENCOUNTER — Other Ambulatory Visit: Payer: Self-pay | Admitting: Internal Medicine

## 2015-07-21 NOTE — Telephone Encounter (Signed)
[phone] [facility]  [address] [place] [address]    Pharm states they had an issues with the refill zolpidem (AMBIEN) 10 MG tablet  [facility] [place]   Pt is in their system as [place], not [place]. And wants to know if you can resend under the "Friend" last name. It will not go through under [place].

## 2015-07-22 NOTE — Telephone Encounter (Signed)
Prescription has been picked up at the pharmacy.

## 2015-12-22 ENCOUNTER — Other Ambulatory Visit: Payer: Self-pay | Admitting: Internal Medicine

## 2015-12-22 DIAGNOSIS — Z1231 Encounter for screening mammogram for malignant neoplasm of breast: Secondary | ICD-10-CM

## 2016-01-02 NOTE — Progress Notes (Signed)
Pre visit review using our clinic review tool, if applicable. No additional management support is needed unless otherwise documented below in the visit note.  Chief Complaint  Patient presents with  . Annual Exam    HPI: Patient  [name]  65 y.o. comes in today for Preventive Health Care visit   [place]  Here for reg check up. Travels and immuniz utd.  Needs refill metrogel   Used ambien  5 mg   occ not now but would like refill  Step son commited suicide this year .    Had left interdigital and toe infection  Seen urgent care and rx improved but still has  Lesion between toe left  Little   Gets mid  Arch numbness from shoes walking 6 miles per day .   To see dr Tonia Brooms today  Health Maintenance  Topic Date Due  . COLONOSCOPY  12/23/2013  . Hepatitis C Screening  01/04/2017 (Originally [birth year])  . HIV Screening  01/04/2017 (Originally 06/30/1965)  . INFLUENZA VACCINE  01/13/2016  . TETANUS/TDAP  06/28/2016  . PAP SMEAR  09/28/2016  . MAMMOGRAM  12/11/2016  . ZOSTAVAX  Completed   Health Maintenance Review LIFESTYLE:  Exercise:  6 miloes Tobacco/ETS:n Alcohol: per day 1 Sugar beverages:no Sleep: 5-7 hours Drug use: no    ROS:  GEN/ HEENT: No fever, significant weight changes sweats headaches vision problems hearing changes, CV/ PULM; No chest pain shortness of breath cough, syncope,edema  change in exercise tolerance. GI /GU: No adominal pain, vomiting, change in bowel habits. No blood in the stool. No significant GU symptoms. SKIN/HEME: ,no acute skin rashes suspicious lesions or bleeding. No lymphadenopathy, nodules, masses.  NEURO/ PSYCH:  No neurologic signs such as weakness numbness. No depression anxiety. IMM/ Allergy: No unusual infections.  Allergy .   REST of 12 system review negative except as per HPI   Past Medical History:  Diagnosis Date  . CERVICAL POLYP 03/11/2008   Qualifier: Diagnosis of  By: Regis Bill MD, Standley Brooking   .  Colon polyps 2005   on colonscopy Dr. Fuller Plan  . Fibroid 2004   Per Dr. Ouida Sills  . History of shingles    face and mouth  . Hx of skin cancer, basal cell   . Rosacea   . Scoliosis    noted on mri done for back pain    Past Surgical History:  Procedure Laterality Date  . BUNIONECTOMY      Family History  Problem Relation Age of Onset  . Hypertension Father   . Osteoporosis      Social History   Social History  . Marital status: Married    Spouse name: N/A  . Number of children: N/A  . Years of education: N/A   Social History Main Topics  . Smoking status: Never Smoker  . Smokeless tobacco: Never Used  . Alcohol use Yes  . Drug use: Unknown  . Sexual activity: Not Asked   Other Topics Concern  . None   Social History Narrative   Married   Spouse had CABG    [employer]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   Has moved to [place]   hh of 2    2 cats          Outpatient Medications Prior to Visit  Medication Sig Dispense Refill  . Flaxseed, Linseed, (FLAX SEED OIL PO) Take 1 tablet by mouth daily.    Javier Docker  Oil CAPS Take 1 capsule by mouth daily.    . Multiple Vitamins-Calcium (VIACTIV MULTI-VITAMIN) CHEW Chew by mouth.    . Multiple Vitamins-Minerals (CENTRUM SILVER ULTRA WOMENS PO) Take by mouth.    . Multiple Vitamins-Minerals (PRESERVISION/LUTEIN PO) Take by mouth.    . Omega-3 Fatty Acids (FISH OIL) 1200 MG CAPS Take 1 capsule by mouth daily.    . metroNIDAZOLE (METROGEL) 1 % gel APPLY TOPICALLY EVERY DAY 60 g 10  . zolpidem (AMBIEN) 10 MG tablet TAKE 1/2-1 TABLET BY MOUTH AT BEDTIME AS NEEDED FOR SLEEP 30 tablet 0  . metroNIDAZOLE (METROGEL) 1 % gel Apply 1 application topically daily. 60 g 3  . metroNIDAZOLE (METROGEL) 1 % gel Apply 1 application topically daily. 60 g 10   No facility-administered medications prior to visit.      EXAM:  BP 124/78 (BP Location: Left Arm, Patient Position: Sitting, Cuff Size: Normal)   Temp 98.7 F (37.1 C) (Oral)    Ht 5' 5"  (1.651 m)   Wt 161 lb 12.8 oz (73.4 kg)   BMI 26.92 kg/m   Body mass index is 26.92 kg/m.  Physical Exam: Vital signs reviewed ZOX:WRUE is a well-developed well-nourished alert cooperative    who appearsr stated age in no acute distress.  HEENT: normocephalic atraumatic , Eyes: PERRL EOM's full, conjunctiva clear, Nares: paten,t no deformity discharge or tenderness., Ears: no deformity EAC's clear TMs with normal landmarks. Mouth: clear OP, no lesions, edema.  Moist mucous membranes. Dentition in adequate repair. NECK: supple without masses, thyromegaly or bruits. CHEST/PULM:  Clear to auscultation and percussion breath sounds equal no wheeze , rales or rhonchi. No chest wall deformities or tenderness.Abdomen:  Sof,t normal bowel sounds without hepatosplenomegaly, no guarding rebound or masses no CVA tenderness CV: PMI is nondisplaced, S1 S2 no gallops, murmurs, rubs. Peripheral pulses are full without delay.No JVD .  ABDOMEN: Bowel sounds normal nontender  No guard or rebound, no hepato splenomegal no CVA tenderness.  No hernia. Extremtities:  No clubbing cyanosis or edema, no acute joint swelling or redness no focal atrophy NEURO:  Oriented x3, cranial nerves 3-12 appear to be intact, no obvious focal weakness,gait within normal limits no abnormal reflexes or asymmetrical SKIN: No acute rashes normal turgor, color, no bruising or petechiae.  Left interdigital area scaly white corn like between 4 and 5 toe  No redness or dc  Or fissure PSYCH: Oriented, good eye contact, no obvious depression anxiety, cognition and judgment appear normal. LN: no cervical axillary inguinal adenopathy    ASSESSMENT AND PLAN:  Discussed the following assessment and plan:  Visit for preventive health examination - check in to cologurd screen  call us if wants to proceed with   order  - Plan: CBC with Differential/Platelet, Basic metabolic panel, Hepatic function panel, Lipid panel,  TSH  Hyperlipidemia - lsi  - Plan: CBC with Differential/Platelet, Basic metabolic panel, Hepatic function panel, Lipid panel, TSH  Medication management  Rosacea  Disturbance in sleep behavior - risk benefot of med disc ocass use pt aware   Skin lesion - interdigital  Patient Care Team: Burnis Medin, MD as PCP - General Olga Millers, MD (Obstetrics and Gynecology) Ladene Artist, MD (Gastroenterology) Crista Luria, MD (Dermatology) Jess Barters Dia Crawford, MD (Inactive) Patient Instructions  Continue lifestyle intervention healthy eating and exercise . Will notify you  of labs when available.  See dr Tonia Brooms about the skin  lesion on your foot .  Ask insurance if  pays for cologuard  and then notify us.    Health Maintenance, Female Adopting a healthy lifestyle and getting preventive care can go a long way to promote health and wellness. Talk with your health care provider about what schedule of regular examinations is right for you. This is a good chance for you to check in with your provider about disease prevention and staying healthy. In between checkups, there are plenty of things you can do on your own. Experts have done a lot of research about which lifestyle changes and preventive measures are most likely to keep you healthy. Ask your health care provider for more information. WEIGHT AND DIET  Eat a healthy diet  Be sure to include plenty of vegetables, fruits, low-fat dairy products, and lean protein.  Do not eat a lot of foods high in solid fats, added sugars, or salt.  Get regular exercise. This is one of the most important things you can do for your health.  Most adults should exercise for at least 150 minutes each week. The exercise should increase your heart rate and make you sweat (moderate-intensity exercise).  Most adults should also do strengthening exercises at least twice a week. This is in addition to the moderate-intensity exercise.  Maintain a  healthy weight  Body mass index (BMI) is a measurement that can be used to identify possible weight problems. It estimates body fat based on height and weight. Your health care provider can help determine your BMI and help you achieve or maintain a healthy weight.  For females 35 years of age and older:   A BMI below 18.5 is considered underweight.  A BMI of 18.5 to 24.9 is normal.  A BMI of 25 to 29.9 is considered overweight.  A BMI of 30 and above is considered obese.  Watch levels of cholesterol and blood lipids  You should start having your blood tested for lipids and cholesterol at 64 years of age, then have this test every 5 years.  You may need to have your cholesterol levels checked more often if:  Your lipid or cholesterol levels are high.  You are older than 64 years of age.  You are at high risk for heart disease.  CANCER SCREENING   Lung Cancer  Lung cancer screening is recommended for adults 58-90 years old who are at high risk for lung cancer because of a history of smoking.  A yearly low-dose CT scan of the lungs is recommended for people who:  Currently smoke.  Have quit within the past 15 years.  Have at least a 30-pack-year history of smoking. A pack year is smoking an average of one pack of cigarettes a day for 1 year.  Yearly screening should continue until it has been 15 years since you quit.  Yearly screening should stop if you develop a health problem that would prevent you from having lung cancer treatment.  Breast Cancer  Practice breast self-awareness. This means understanding how your breasts normally appear and feel.  It also means doing regular breast self-exams. Let your health care provider know about any changes, no matter how small.  If you are in your 20s or 30s, you should have a clinical breast exam (CBE) by a health care provider every 1-3 years as part of a regular health exam.  If you are 42 or older, have a CBE every year.  Also consider having a breast X-ray (mammogram) every year.  If you have a family history of breast cancer,  talk to your health care provider about genetic screening.  If you are at high risk for breast cancer, talk to your health care provider about having an MRI and a mammogram every year.  Breast cancer gene (BRCA) assessment is recommended for women who have family members with BRCA-related cancers. BRCA-related cancers include:  Breast.  Ovarian.  Tubal.  Peritoneal cancers.  Results of the assessment will determine the need for genetic counseling and BRCA1 and BRCA2 testing. Cervical Cancer Your health care provider may recommend that you be screened regularly for cancer of the pelvic organs (ovaries, uterus, and vagina). This screening involves a pelvic examination, including checking for microscopic changes to the surface of your cervix (Pap test). You may be encouraged to have this screening done every 3 years, beginning at age 68.  For women ages 33-65, health care providers may recommend pelvic exams and Pap testing every 3 years, or they may recommend the Pap and pelvic exam, combined with testing for human papilloma virus (HPV), every 5 years. Some types of HPV increase your risk of cervical cancer. Testing for HPV may also be done on women of any age with unclear Pap test results.  Other health care providers may not recommend any screening for nonpregnant women who are considered low risk for pelvic cancer and who do not have symptoms. Ask your health care provider if a screening pelvic exam is right for you.  If you have had past treatment for cervical cancer or a condition that could lead to cancer, you need Pap tests and screening for cancer for at least 20 years after your treatment. If Pap tests have been discontinued, your risk factors (such as having a new sexual partner) need to be reassessed to determine if screening should resume. Some women have medical problems that  increase the chance of getting cervical cancer. In these cases, your health care provider may recommend more frequent screening and Pap tests. Colorectal Cancer  This type of cancer can be detected and often prevented.  Routine colorectal cancer screening usually begins at 64 years of age and continues through 65 years of age.  Your health care provider may recommend screening at an earlier age if you have risk factors for colon cancer.  Your health care provider may also recommend using home test kits to check for hidden blood in the stool.  A small camera at the end of a tube can be used to examine your colon directly (sigmoidoscopy or colonoscopy). This is done to check for the earliest forms of colorectal cancer.  Routine screening usually begins at age 70.  Direct examination of the colon should be repeated every 5-10 years through 64 years of age. However, you may need to be screened more often if early forms of precancerous polyps or small growths are found. Skin Cancer  Check your skin from head to toe regularly.  Tell your health care provider about any new moles or changes in moles, especially if there is a change in a mole's shape or color.  Also tell your health care provider if you have a mole that is larger than the size of a pencil eraser.  Always use sunscreen. Apply sunscreen liberally and repeatedly throughout the day.  Protect yourself by wearing long sleeves, pants, a wide-brimmed hat, and sunglasses whenever you are outside. HEART DISEASE, DIABETES, AND HIGH BLOOD PRESSURE   High blood pressure causes heart disease and increases the risk of stroke. High blood pressure is more likely to develop  in:  People who have blood pressure in the high end of the normal range (130-139/85-89 mm Hg).  People who are overweight or obese.  People who are African American.  If you are 68-29 years of age, have your blood pressure checked every 3-5 years. If you are 71 years of  age or older, have your blood pressure checked every year. You should have your blood pressure measured twice--once when you are at a hospital or clinic, and once when you are not at a hospital or clinic. Record the average of the two measurements. To check your blood pressure when you are not at a hospital or clinic, you can use:  An automated blood pressure machine at a pharmacy.  A home blood pressure monitor.  If you are between 75 years and 33 years old, ask your health care provider if you should take aspirin to prevent strokes.  Have regular diabetes screenings. This involves taking a blood sample to check your fasting blood sugar level.  If you are at a normal weight and have a low risk for diabetes, have this test once every three years after 64 years of age.  If you are overweight and have a high risk for diabetes, consider being tested at a younger age or more often. PREVENTING INFECTION  Hepatitis B  If you have a higher risk for hepatitis B, you should be screened for this virus. You are considered at high risk for hepatitis B if:  You were born in a country where hepatitis B is common. Ask your health care provider which countries are considered high risk.  Your parents were born in a high-risk country, and you have not been immunized against hepatitis B (hepatitis B vaccine).  You have HIV or AIDS.  You use needles to inject street drugs.  You live with someone who has hepatitis B.  You have had sex with someone who has hepatitis B.  You get hemodialysis treatment.  You take certain medicines for conditions, including cancer, organ transplantation, and autoimmune conditions. Hepatitis C  Blood testing is recommended for:  Everyone born from [birth year] through 1964.  Anyone with known risk factors for hepatitis C. Sexually transmitted infections (STIs)  You should be screened for sexually transmitted infections (STIs) including gonorrhea and chlamydia if:  You are  sexually active and are younger than 64 years of age.  You are older than 64 years of age and your health care provider tells you that you are at risk for this type of infection.  Your sexual activity has changed since you were last screened and you are at an increased risk for chlamydia or gonorrhea. Ask your health care provider if you are at risk.  If you do not have HIV, but are at risk, it may be recommended that you take a prescription medicine daily to prevent HIV infection. This is called pre-exposure prophylaxis (PrEP). You are considered at risk if:  You are sexually active and do not regularly use condoms or know the HIV status of your partner(s).  You take drugs by injection.  You are sexually active with a partner who has HIV. Talk with your health care provider about whether you are at high risk of being infected with HIV. If you choose to begin PrEP, you should first be tested for HIV. You should then be tested every 3 months for as long as you are taking PrEP.  PREGNANCY   If you are premenopausal and you may become pregnant, ask  your health care provider about preconception counseling.  If you may become pregnant, take 400 to 800 micrograms (mcg) of folic acid every day.  If you want to prevent pregnancy, talk to your health care provider about birth control (contraception). OSTEOPOROSIS AND MENOPAUSE   Osteoporosis is a disease in which the bones lose minerals and strength with aging. This can result in serious bone fractures. Your risk for osteoporosis can be identified using a bone density scan.  If you are 68 years of age or older, or if you are at risk for osteoporosis and fractures, ask your health care provider if you should be screened.  Ask your health care provider whether you should take a calcium or vitamin D supplement to lower your risk for osteoporosis.  Menopause may have certain physical symptoms and risks.  Hormone replacement therapy may reduce some  of these symptoms and risks. Talk to your health care provider about whether hormone replacement therapy is right for you.  HOME CARE INSTRUCTIONS   Schedule regular health, dental, and eye exams.  Stay current with your immunizations.   Do not use any tobacco products including cigarettes, chewing tobacco, or electronic cigarettes.  If you are pregnant, do not drink alcohol.  If you are breastfeeding, limit how much and how often you drink alcohol.  Limit alcohol intake to no more than 1 drink per day for nonpregnant women. One drink equals 12 ounces of beer, 5 ounces of wine, or 1 ounces of hard liquor.  Do not use street drugs.  Do not share needles.  Ask your health care provider for help if you need support or information about quitting drugs.  Tell your health care provider if you often feel depressed.  Tell your health care provider if you have ever been abused or do not feel safe at home.   This information is not intended to replace advice given to you by your health care provider. Make sure you discuss any questions you have with your health care provider.   Document Released: 12/14/2010 Document Revised: 06/21/2014 Document Reviewed: 05/02/2013 Elsevier Interactive Patient Education [employer].   No I is Procedure I have well well you   Standley Brooking. Panosh M.D.

## 2016-01-05 ENCOUNTER — Encounter: Payer: Self-pay | Admitting: Internal Medicine

## 2016-01-05 ENCOUNTER — Ambulatory Visit (INDEPENDENT_AMBULATORY_CARE_PROVIDER_SITE_OTHER): Payer: BC Managed Care – PPO | Admitting: Internal Medicine

## 2016-01-05 ENCOUNTER — Telehealth: Payer: Self-pay | Admitting: Internal Medicine

## 2016-01-05 ENCOUNTER — Ambulatory Visit
Admission: RE | Admit: 2016-01-05 | Discharge: 2016-01-05 | Disposition: A | Discharge status: Home / Self Care | Payer: BC Managed Care – PPO | Source: Ambulatory Visit | Attending: Internal Medicine | Admitting: Internal Medicine

## 2016-01-05 VITALS — BP 124/78 | Temp 98.7°F | Ht 65.0 in | Wt 161.8 lb

## 2016-01-05 DIAGNOSIS — Z79899 Other long term (current) drug therapy: Secondary | ICD-10-CM

## 2016-01-05 DIAGNOSIS — L989 Disorder of the skin and subcutaneous tissue, unspecified: Secondary | ICD-10-CM

## 2016-01-05 DIAGNOSIS — L719 Rosacea, unspecified: Secondary | ICD-10-CM | POA: Diagnosis not present

## 2016-01-05 DIAGNOSIS — Z0000 Encounter for general adult medical examination without abnormal findings: Secondary | ICD-10-CM

## 2016-01-05 DIAGNOSIS — Z1231 Encounter for screening mammogram for malignant neoplasm of breast: Secondary | ICD-10-CM

## 2016-01-05 DIAGNOSIS — E785 Hyperlipidemia, unspecified: Secondary | ICD-10-CM

## 2016-01-05 DIAGNOSIS — G479 Sleep disorder, unspecified: Secondary | ICD-10-CM

## 2016-01-05 LAB — LIPID PANEL
CHOL/HDL RATIO: 3
Cholesterol: 224 mg/dL — ABNORMAL HIGH (ref 0–200)
HDL: 81.8 mg/dL (ref >39.00)
LDL Cholesterol: 131 mg/dL — ABNORMAL HIGH (ref 0–99)
NONHDL: 142.6
TRIGLYCERIDES: 58 mg/dL (ref 0.0–149.0)
VLDL: 11.6 mg/dL (ref 0.0–40.0)

## 2016-01-05 LAB — TSH: TSH: 1.14 u[IU]/mL (ref 0.35–4.50)

## 2016-01-05 LAB — CBC WITH DIFFERENTIAL/PLATELET
BASOS ABS: 0 10*3/uL (ref 0.0–0.1)
BASOS PCT: 0.7 % (ref 0.0–3.0)
EOS ABS: 0.2 10*3/uL (ref 0.0–0.7)
Eosinophils Relative: 3.2 % (ref 0.0–5.0)
HCT: 38.3 % (ref 36.0–46.0)
Hemoglobin: 13 g/dL (ref 12.0–15.0)
Lymphocytes Relative: 28.2 % (ref 12.0–46.0)
Lymphs Abs: 1.5 10*3/uL (ref 0.7–4.0)
MCHC: 33.9 g/dL (ref 30.0–36.0)
MCV: 92.7 fl (ref 78.0–100.0)
MONO ABS: 0.3 10*3/uL (ref 0.1–1.0)
Monocytes Relative: 6.2 % (ref 3.0–12.0)
NEUTROS ABS: 3.3 10*3/uL (ref 1.4–7.7)
NEUTROS PCT: 61.7 % (ref 43.0–77.0)
PLATELETS: 214 10*3/uL (ref 150.0–400.0)
RBC: 4.14 Mil/uL (ref 3.87–5.11)
RDW: 14 % (ref 11.5–15.5)
WBC: 5.4 10*3/uL (ref 4.0–10.5)

## 2016-01-05 LAB — BASIC METABOLIC PANEL
BUN: 21 mg/dL (ref 6–23)
CHLORIDE: 106 meq/L (ref 96–112)
CO2: 31 mEq/L (ref 19–32)
Calcium: 9.4 mg/dL (ref 8.4–10.5)
Creatinine, Ser: 0.64 mg/dL (ref 0.40–1.20)
GFR: 99.13 mL/min (ref >60.00)
Glucose, Bld: 80 mg/dL (ref 70–99)
POTASSIUM: 4.1 meq/L (ref 3.5–5.1)
SODIUM: 142 meq/L (ref 135–145)

## 2016-01-05 LAB — HEPATIC FUNCTION PANEL
ALK PHOS: 57 U/L (ref 39–117)
ALT: 13 U/L (ref 0–35)
AST: 19 U/L (ref 0–37)
Albumin: 4.1 g/dL (ref 3.5–5.2)
BILIRUBIN DIRECT: 0.1 mg/dL (ref 0.0–0.3)
BILIRUBIN TOTAL: 0.6 mg/dL (ref 0.2–1.2)
TOTAL PROTEIN: 6.8 g/dL (ref 6.0–8.3)

## 2016-01-05 MED ORDER — METRONIDAZOLE 1 % EX GEL: CUTANEOUS | 10 refills | Status: DC

## 2016-01-05 MED ORDER — ZOLPIDEM TARTRATE 10 MG PO TABS: ORAL_TABLET | ORAL | 0 refills | Status: DC

## 2016-01-05 NOTE — Telephone Encounter (Signed)
Pt said INS will not cover cologuard .

## 2016-01-05 NOTE — Patient Instructions (Addendum)
Continue lifestyle intervention healthy eating and exercise . Will notify you  of labs when available.  See dr Tonia Brooms about the skin  lesion on your foot .  Ask insurance if pays for cologuard  and then notify us.    Health Maintenance, Female Adopting a healthy lifestyle and getting preventive care can go a long way to promote health and wellness. Talk with your health care provider about what schedule of regular examinations is right for you. This is a good chance for you to check in with your provider about disease prevention and staying healthy. In between checkups, there are plenty of things you can do on your own. Experts have done a lot of research about which lifestyle changes and preventive measures are most likely to keep you healthy. Ask your health care provider for more information. WEIGHT AND DIET  Eat a healthy diet  Be sure to include plenty of vegetables, fruits, low-fat dairy products, and lean protein.  Do not eat a lot of foods high in solid fats, added sugars, or salt.  Get regular exercise. This is one of the most important things you can do for your health.  Most adults should exercise for at least 150 minutes each week. The exercise should increase your heart rate and make you sweat (moderate-intensity exercise).  Most adults should also do strengthening exercises at least twice a week. This is in addition to the moderate-intensity exercise.  Maintain a healthy weight  Body mass index (BMI) is a measurement that can be used to identify possible weight problems. It estimates body fat based on height and weight. Your health care provider can help determine your BMI and help you achieve or maintain a healthy weight.  For females 64 years of age and older:   A BMI below 18.5 is considered underweight.  A BMI of 18.5 to 24.9 is normal.  A BMI of 25 to 29.9 is considered overweight.  A BMI of 30 and above is considered obese.  Watch levels of cholesterol and  blood lipids  You should start having your blood tested for lipids and cholesterol at 64 years of age, then have this test every 5 years.  You may need to have your cholesterol levels checked more often if:  Your lipid or cholesterol levels are high.  You are older than 65 years of age.  You are at high risk for heart disease.  CANCER SCREENING   Lung Cancer  Lung cancer screening is recommended for adults 64-41 years old who are at high risk for lung cancer because of a history of smoking.  A yearly low-dose CT scan of the lungs is recommended for people who:  Currently smoke.  Have quit within the past 15 years.  Have at least a 30-pack-year history of smoking. A pack year is smoking an average of one pack of cigarettes a day for 1 year.  Yearly screening should continue until it has been 64 years since you quit.  Yearly screening should stop if you develop a health problem that would prevent you from having lung cancer treatment.  Breast Cancer  Practice breast self-awareness. This means understanding how your breasts normally appear and feel.  It also means doing regular breast self-exams. Let your health care provider know about any changes, no matter how small.  If you are in your 20s or 30s, you should have a clinical breast exam (CBE) by a health care provider every 1-3 years as part of a regular health  exam.  If you are 40 or older, have a CBE every year. Also consider having a breast X-ray (mammogram) every year.  If you have a family history of breast cancer, talk to your health care provider about genetic screening.  If you are at high risk for breast cancer, talk to your health care provider about having an MRI and a mammogram every year.  Breast cancer gene (BRCA) assessment is recommended for women who have family members with BRCA-related cancers. BRCA-related cancers include:  Breast.  Ovarian.  Tubal.  Peritoneal cancers.  Results of the  assessment will determine the need for genetic counseling and BRCA1 and BRCA2 testing. Cervical Cancer Your health care provider may recommend that you be screened regularly for cancer of the pelvic organs (ovaries, uterus, and vagina). This screening involves a pelvic examination, including checking for microscopic changes to the surface of your cervix (Pap test). You may be encouraged to have this screening done every 3 years, beginning at age 27.  For women ages 47-65, health care providers may recommend pelvic exams and Pap testing every 3 years, or they may recommend the Pap and pelvic exam, combined with testing for human papilloma virus (HPV), every 5 years. Some types of HPV increase your risk of cervical cancer. Testing for HPV may also be done on women of any age with unclear Pap test results.  Other health care providers may not recommend any screening for nonpregnant women who are considered low risk for pelvic cancer and who do not have symptoms. Ask your health care provider if a screening pelvic exam is right for you.  If you have had past treatment for cervical cancer or a condition that could lead to cancer, you need Pap tests and screening for cancer for at least 20 years after your treatment. If Pap tests have been discontinued, your risk factors (such as having a new sexual partner) need to be reassessed to determine if screening should resume. Some women have medical problems that increase the chance of getting cervical cancer. In these cases, your health care provider may recommend more frequent screening and Pap tests. Colorectal Cancer  This type of cancer can be detected and often prevented.  Routine colorectal cancer screening usually begins at 64 years of age and continues through 63 years of age.  Your health care provider may recommend screening at an earlier age if you have risk factors for colon cancer.  Your health care provider may also recommend using home test kits  to check for hidden blood in the stool.  A small camera at the end of a tube can be used to examine your colon directly (sigmoidoscopy or colonoscopy). This is done to check for the earliest forms of colorectal cancer.  Routine screening usually begins at age 10.  Direct examination of the colon should be repeated every 5-10 years through 64 years of age. However, you may need to be screened more often if early forms of precancerous polyps or small growths are found. Skin Cancer  Check your skin from head to toe regularly.  Tell your health care provider about any new moles or changes in moles, especially if there is a change in a mole's shape or color.  Also tell your health care provider if you have a mole that is larger than the size of a pencil eraser.  Always use sunscreen. Apply sunscreen liberally and repeatedly throughout the day.  Protect yourself by wearing long sleeves, pants, a wide-brimmed hat, and sunglasses  whenever you are outside. HEART DISEASE, DIABETES, AND HIGH BLOOD PRESSURE   High blood pressure causes heart disease and increases the risk of stroke. High blood pressure is more likely to develop in:  People who have blood pressure in the high end of the normal range (130-139/85-89 mm Hg).  People who are overweight or obese.  People who are African American.  If you are 29-38 years of age, have your blood pressure checked every 3-5 years. If you are 88 years of age or older, have your blood pressure checked every year. You should have your blood pressure measured twice--once when you are at a hospital or clinic, and once when you are not at a hospital or clinic. Record the average of the two measurements. To check your blood pressure when you are not at a hospital or clinic, you can use:  An automated blood pressure machine at a pharmacy.  A home blood pressure monitor.  If you are between 21 years and 74 years old, ask your health care provider if you should  take aspirin to prevent strokes.  Have regular diabetes screenings. This involves taking a blood sample to check your fasting blood sugar level.  If you are at a normal weight and have a low risk for diabetes, have this test once every three years after 64 years of age.  If you are overweight and have a high risk for diabetes, consider being tested at a younger age or more often. PREVENTING INFECTION  Hepatitis B  If you have a higher risk for hepatitis B, you should be screened for this virus. You are considered at high risk for hepatitis B if:  You were born in a country where hepatitis B is common. Ask your health care provider which countries are considered high risk.  Your parents were born in a high-risk country, and you have not been immunized against hepatitis B (hepatitis B vaccine).  You have HIV or AIDS.  You use needles to inject street drugs.  You live with someone who has hepatitis B.  You have had sex with someone who has hepatitis B.  You get hemodialysis treatment.  You take certain medicines for conditions, including cancer, organ transplantation, and autoimmune conditions. Hepatitis C  Blood testing is recommended for:  Everyone born from [birth year] through 1964.  Anyone with known risk factors for hepatitis C. Sexually transmitted infections (STIs)  You should be screened for sexually transmitted infections (STIs) including gonorrhea and chlamydia if:  You are sexually active and are younger than 64 years of age.  You are older than 64 years of age and your health care provider tells you that you are at risk for this type of infection.  Your sexual activity has changed since you were last screened and you are at an increased risk for chlamydia or gonorrhea. Ask your health care provider if you are at risk.  If you do not have HIV, but are at risk, it may be recommended that you take a prescription medicine daily to prevent HIV infection. This is called  pre-exposure prophylaxis (PrEP). You are considered at risk if:  You are sexually active and do not regularly use condoms or know the HIV status of your partner(s).  You take drugs by injection.  You are sexually active with a partner who has HIV. Talk with your health care provider about whether you are at high risk of being infected with HIV. If you choose to begin PrEP, you should first be  tested for HIV. You should then be tested every 3 months for as long as you are taking PrEP.  PREGNANCY   If you are premenopausal and you may become pregnant, ask your health care provider about preconception counseling.  If you may become pregnant, take 400 to 800 micrograms (mcg) of folic acid every day.  If you want to prevent pregnancy, talk to your health care provider about birth control (contraception). OSTEOPOROSIS AND MENOPAUSE   Osteoporosis is a disease in which the bones lose minerals and strength with aging. This can result in serious bone fractures. Your risk for osteoporosis can be identified using a bone density scan.  If you are 9 years of age or older, or if you are at risk for osteoporosis and fractures, ask your health care provider if you should be screened.  Ask your health care provider whether you should take a calcium or vitamin D supplement to lower your risk for osteoporosis.  Menopause may have certain physical symptoms and risks.  Hormone replacement therapy may reduce some of these symptoms and risks. Talk to your health care provider about whether hormone replacement therapy is right for you.  HOME CARE INSTRUCTIONS   Schedule regular health, dental, and eye exams.  Stay current with your immunizations.   Do not use any tobacco products including cigarettes, chewing tobacco, or electronic cigarettes.  If you are pregnant, do not drink alcohol.  If you are breastfeeding, limit how much and how often you drink alcohol.  Limit alcohol intake to no more than 1  drink per day for nonpregnant women. One drink equals 12 ounces of beer, 5 ounces of wine, or 1 ounces of hard liquor.  Do not use street drugs.  Do not share needles.  Ask your health care provider for help if you need support or information about quitting drugs.  Tell your health care provider if you often feel depressed.  Tell your health care provider if you have ever been abused or do not feel safe at home.   This information is not intended to replace advice given to you by your health care provider. Make sure you discuss any questions you have with your health care provider.   Document Released: 12/14/2010 Document Revised: 06/21/2014 Document Reviewed: 05/02/2013 Elsevier Interactive Patient Education 2016 [employer].   No I is Procedure I have well well you

## 2016-01-06 NOTE — Telephone Encounter (Signed)
She should either do  IFOB stool tess yearly  Or do a colonscopy for colon screening .  If not the cologuard

## 2016-01-07 NOTE — Telephone Encounter (Signed)
Misty pt returned your call °

## 2016-01-07 NOTE — Telephone Encounter (Signed)
Pt would like a callback after 415 pm

## 2016-01-07 NOTE — Telephone Encounter (Signed)
Left a message for a return call.

## 2016-01-12 NOTE — Telephone Encounter (Signed)
Ok to wait  But she will need to contact us about this and may have to  Have another form signed .

## 2016-01-12 NOTE — Telephone Encounter (Signed)
Spoke to the pt and informed her WP would like IFOB or colonoscopy.  She stated that she turns 66 in five months and would like to wait until she gets on Medicare.  Said that medicare will cover Cologuard test.  Informed her that I will notify [facility] and if there are any objections to wait than I will call her back.  Pt stated ok to leave detailed message on voicemail.

## 2017-01-03 ENCOUNTER — Other Ambulatory Visit: Payer: Self-pay | Admitting: Internal Medicine

## 2017-01-03 DIAGNOSIS — Z1231 Encounter for screening mammogram for malignant neoplasm of breast: Secondary | ICD-10-CM

## 2017-03-01 ENCOUNTER — Encounter: Payer: Self-pay | Admitting: Internal Medicine

## 2017-03-01 ENCOUNTER — Ambulatory Visit: Payer: BC Managed Care – PPO

## 2017-03-01 ENCOUNTER — Telehealth: Payer: Self-pay | Admitting: Internal Medicine

## 2017-03-01 NOTE — Telephone Encounter (Signed)
Please advise 

## 2017-03-01 NOTE — Telephone Encounter (Signed)
° ° ° ° ° °  Pt is traveling out of the country  and is asking for traveling diarrhea medicine. She also said that she had received Malaria pills and is asking if Dr Regis Bill had given them to her if not she will check with health department    Pharmacy [facility]

## 2017-03-02 NOTE — Telephone Encounter (Signed)
Need more information  To answer this  Where going  dates of travel     And any other [email or web address]   Recommendations. From their travel site   Would need this for  Deciding on malaria and   Antibiotic  For travelers diarreha

## 2017-03-04 ENCOUNTER — Encounter: Payer: Self-pay | Admitting: Internal Medicine

## 2017-03-04 MED ORDER — AZITHROMYCIN 500 MG PO TABS: 500.0000 mg | ORAL_TABLET | Freq: Every day | ORAL | 1 refills | Status: DC

## 2017-03-04 NOTE — Telephone Encounter (Signed)
Can you call patient and verify info that Dr. Regis Bill is requesting? Please. Thank you.

## 2017-03-04 NOTE — Telephone Encounter (Signed)
° ° ° °  Pt no longer need the Malaria medicine.She only need the anti diarrhea medicine  Called into her pharmacy .She will be leaving on 03/14/17 and returning 03/31/17

## 2017-03-04 NOTE — Telephone Encounter (Signed)
Traveling to [place], [place] and [place] and is requesting an anti-diarrheal medication.  Pt is very frustrated with the back and forth and states that she has already given this request and all this information several times including this morning. I pologized for the inconvenience and advised that this would be taken care of while on the phone -- placed patient on hold to speak with Dr Regis Bill.  Pt has already gotten her Malaria medication from Health Department - no longer needed.   Per Dr Regis Bill call in Azithromycin 500mg  once daily x 3 days, #3 x 1 refill.  Called in to [facility].   Nothing further needed.

## 2017-04-11 NOTE — Progress Notes (Signed)
Chief Complaint  Patient presents with  . Yearly Visit    HPI: [name] 66 y.o. comes in today for welcome to medicare? Preventive Medicare exam/ wellness visit .Since last visit.   Doing ok  Infected toe this summer .  Getting better local care no pain. Still living in [place].  Needs a refill for her MetroGel seems to help her for her rosacea.  She is working on lifestyle in regard to her cholesterol level.  Takes half to 1 of an Ambien if needed for sleep usually with travel as she is still working full-time but on a contract basis.  This year her husband had a severe illness infection in the bone with neuropathy and is currently recovering from pneumonia  in [place]  Topic Date Due  . Hepatitis C Screening  07-09-1950  . HIV Screening  06/30/1966  . COLONOSCOPY  12/23/2013  . TETANUS/TDAP  06/28/2016  . DEXA SCAN  06/30/2016  . PNA vac Low Risk Adult (1 of 2 - PCV13) 06/30/2016  . INFLUENZA VACCINE  01/12/2017  . PAP SMEAR  06/15/2019 (Originally 09/28/2016)  . MAMMOGRAM  01/04/2018   Health Maintenance Review LIFESTYLE:  Exercise:  Walk 6-7  Miles  Per day  Tobacco/ETS:  no Alcohol:   ocass Sugar beverages: Sleep: 7+  Drug use: no  HH:  2  No pets  Work  Almost [place] [employer] .  Travel. 61 .  28  [place] .  ocasss ambien.     Travel not a lot .       Hearing:  Ok   Vision:  No limitations at present . Last eye check UTD  Safety:  Has smoke detector and wears seat belts.  No firearms. No excess sun exposure. Sees dentist regularly.  Falls:   Memory: Felt to be good  , no concern from her or her family.  Depression: No anhedonia unusual crying or depressive symptoms  Nutrition: Eats well balanced diet; adequate calcium and vitamin D. No swallowing chewing problems.  Injury: no major injuries in the last six months.  Other healthcare providers:  Reviewed today .  Social:  Lives with spouse married. No pets.    Preventive parameters: up-to-date  Reviewed   ADLS:   There are no problems or need for assistance  driving, feeding, obtaining food, dressing, toileting and bathing, managing money using phone. She is independent.    ROS:  GEN/ HEENT: No fever, significant weight changes sweats headaches vision problems hearing changes, CV/ PULM; No chest pain shortness of breath cough, syncope,edema  change in exercise tolerance. GI /GU: No adominal pain, vomiting, change in bowel habits. No blood in the stool. No significant GU symptoms. SKIN/HEME: ,no acute skin rashes suspicious lesions or bleeding. No lymphadenopathy, nodules, masses.  NEURO/ PSYCH:  No neurologic signs such as weakness numbness. No depression anxiety. IMM/ Allergy: No unusual infections.  Allergy .   REST of 12 system review negative except as per HPI   Past Medical History:  Diagnosis Date  . CERVICAL POLYP 03/11/2008   Qualifier: Diagnosis of  By: Regis Bill MD, Standley Brooking   . Colon polyps 2005   on colonscopy Dr. Fuller Plan  . Fibroid 2004   Per Dr. Ouida Sills  . History of shingles    face and mouth  . Hx of skin cancer, basal cell   . Rosacea   . Scoliosis    noted on mri done for back pain  Family History  Problem Relation Age of Onset  . Hypertension Father   . Osteoporosis Unknown   . Breast cancer Neg Hx     Social History   Social History  . Marital status: Married    Spouse name: N/A  . Number of children: N/A  . Years of education: N/A   Social History Main Topics  . Smoking status: Never Smoker  . Smokeless tobacco: Never Used  . Alcohol use Yes  . Drug use: Unknown  . Sexual activity: Not Asked   Other Topics Concern  . 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 Flaxseed, Linseed, (FLAX SEED OIL PO) Take  1 tablet by mouth daily.  Astrid Drafts CAPS Take 1 capsule by mouth daily.  . metroNIDAZOLE (METROGEL) 1 % gel APPLY TOPICALLY EVERY DAY  . Multiple Vitamins-Calcium (VIACTIV MULTI-VITAMIN) CHEW Chew by mouth.  . Multiple Vitamins-Minerals (CENTRUM SILVER ULTRA WOMENS PO) Take by mouth.  . Multiple Vitamins-Minerals (PRESERVISION/LUTEIN PO) Take by mouth.  . Omega-3 Fatty Acids (FISH OIL) 1200 MG CAPS Take 1 capsule by mouth daily.  Marland Kitchen zolpidem (AMBIEN) 10 MG tablet TAKE 1/2-1 TABLET BY MOUTH AT BEDTIME AS NEEDED FOR SLEEP  . [DISCONTINUED] metroNIDAZOLE (METROGEL) 1 % gel APPLY TOPICALLY EVERY DAY  . [DISCONTINUED] zolpidem (AMBIEN) 10 MG tablet TAKE 1/2-1 TABLET BY MOUTH AT BEDTIME AS NEEDED FOR SLEEP  . Zoster Vaccine Adjuvanted [facility]) injection Inject 0.5 mLs into the muscle once. Repeat in 2-6 months  . [DISCONTINUED] azithromycin (ZITHROMAX) 500 MG tablet Take 1 tablet (500 mg total) by mouth daily. (Patient not taking: Reported on 04/14/2017)   No facility-administered encounter medications on file as of 04/14/2017.     EXAM:  BP 128/82 (BP Location: Right Arm, Patient Position: Sitting, Cuff Size: Normal)   Pulse (!) 108   Temp 98.1 F (36.7 C) (Oral)   Ht 5' 4.75" (1.645 m)   Wt 150 lb 11.2 oz (68.4 kg)   BMI 25.27 kg/m   Body mass index is 25.27 kg/m.  Physical Exam: Vital signs reviewed WKG:SUPJ is a well-developed well-nourished alert cooperative   who appears stated age in no acute distress.  HEENT: normocephalic atraumatic , Eyes: PERRL EOM's full, conjunctiva clear, Nares: paten,t no deformity discharge or tenderness., Ears: no deformity EAC's clear TMs with normal landmarks. Mouth: clear OP, no lesions, edema.  Moist mucous membranes. Dentition in adequate repair. NECK: supple without masses, thyromegaly or bruits. CHEST/PULM:  Clear to auscultation and percussion breath sounds equal no wheeze , rales or rhonchi. No chest wall deformities or tenderness. CV: PMI is  nondisplaced, S1 S2 no gallops, murmurs, rubs. Peripheral pulses are full without delay.No JVD . Breast: normal by inspection . No dimpling, discharge, masses, tenderness or discharge . ABDOMEN: Bowel sounds normal nontender  No guard or rebound, no hepato splenomegal no CVA tenderness.   Extremtities:  No clubbing cyanosis or edema, no acute joint swelling or redness no focal atrophy right foot between little and fourth toe some white areas no cracking and a corn-like lesion. NEURO:  Oriented x3, cranial nerves 3-12 appear to be intact, no obvious focal weakness,gait within normal limits no abnormal  reflexes or asymmetrical SKIN: No acute rashes normal turgor, color, no bruising or petechiae. PSYCH: Oriented, good eye contact, no obvious depression anxiety, cognition and judgment appear normal. LN: no cervical axillary inguinal adenopathy No noted deficits in memory, attention, and speech.   Lab Results  Component Value Date   WBC 5.4 01/05/2016   HGB 13.0 01/05/2016   HCT 38.3 01/05/2016   PLT 214.0 01/05/2016   GLUCOSE 80 01/05/2016   CHOL 224 (H) 01/05/2016   TRIG 58.0 01/05/2016   HDL 81.80 01/05/2016   LDLDIRECT 162.3 03/21/2012   LDLCALC 131 (H) 01/05/2016   ALT 13 01/05/2016   AST 19 01/05/2016   NA 142 01/05/2016   K 4.1 01/05/2016   CL 106 01/05/2016   CREATININE 0.64 01/05/2016   BUN 21 01/05/2016   CO2 31 01/05/2016   TSH 1.14 01/05/2016    ASSESSMENT AND PLAN:  Discussed the following assessment and plan:  Visit for preventive health examination - Plan: Lipid panel, Basic metabolic panel, Hepatic function panel, CBC with Differential/Platelet  Hyperlipidemia, unspecified hyperlipidemia type - Lifestyle intervention at this point. - Plan: Lipid panel, Basic metabolic panel, Hepatic function panel, CBC with Differential/Platelet  Medication management - Plan: Lipid panel, Basic metabolic panel, Hepatic function panel, CBC with  Differential/Platelet  Rosacea  Disturbance in sleep behavior - Intermittent issues risk benefit of medicine discussed can continue with caution only occasional to rare use. - Plan: Lipid panel, Basic metabolic panel, Hepatic function panel, CBC with Differential/Platelet  Screening for colon cancer - Average risk okay to order cologuard  Need for influenza vaccination - Plan: Flu vaccine HIGH DOSE PF (Fluzone High dose)  Need for pneumococcal vaccination  Estrogen deficiency - Negative family history of osteoporosis bone density ordered can get any time in the next year and a half call him for appointment as she does live in [place] - Plan: DG Bone Density  Patient Care Team: Panosh, Standley Brooking, MD as PCP - General Olga Millers, MD (Obstetrics and Gynecology) Ladene Artist, MD (Gastroenterology) Crista Luria, MD (Dermatology) Regal Dpm, Jess Barters, MD (Inactive)  Patient Instructions  Continue lifestyle intervention healthy eating and exercise . Will notify you  of labs when available. Cautious use of ambien  Shingles vaccine when available at pharmacy   cologuard  .   Bone density if never done.  Scheduled here or elsewhere    Preventive Care 65 Years and Older, Female Preventive care refers to lifestyle choices and visits with your health care provider that can promote health and wellness. What does preventive care include?  A yearly physical exam. This is also called an annual well check.  Dental exams once or twice a year.  Routine eye exams. Ask your health care provider how often you should have your eyes checked.  Personal lifestyle choices, including: ? Daily care of your teeth and gums. ? Regular physical activity. ? Eating a healthy diet. ? Avoiding tobacco and drug use. ? Limiting alcohol use. ? Practicing safe sex. ? Taking low-dose aspirin every day. ? Taking vitamin and mineral supplements as recommended by your health care provider. What happens  during an annual well check? The services and screenings done by your health care provider during your annual well check will depend on your age, overall health, lifestyle risk factors, and family history of disease. Counseling Your health care provider may ask you questions about your:  Alcohol use.  Tobacco use.  Drug use.  Emotional well-being.  Home and  relationship well-being.  Sexual activity.  Eating habits.  History of falls.  Memory and ability to understand (cognition).  Work and work Statistician.  Reproductive health.  Screening You may have the following tests or measurements:  Height, weight, and BMI.  Blood pressure.  Lipid and cholesterol levels. These may be checked every 5 years, or more frequently if you are over 9 years old.  Skin check.  Lung cancer screening. You may have this screening every year starting at age 65 if you have a 30-pack-year history of smoking and currently smoke or have quit within the past 15 years.  Fecal occult blood test (FOBT) of the stool. You may have this test every year starting at age 56.  Flexible sigmoidoscopy or colonoscopy. You may have a sigmoidoscopy every 5 years or a colonoscopy every 10 years starting at age 46.  Hepatitis C blood test.  Hepatitis B blood test.  Sexually transmitted disease (STD) testing.  Diabetes screening. This is done by checking your blood sugar (glucose) after you have not eaten for a while (fasting). You may have this done every 1-3 years.  Bone density scan. This is done to screen for osteoporosis. You may have this done starting at age 72.  Mammogram. This may be done every 1-2 years. Talk to your health care provider about how often you should have regular mammograms.  Talk with your health care provider about your test results, treatment options, and if necessary, the need for more tests. Vaccines Your health care provider may recommend certain vaccines, such  as:  Influenza vaccine. This is recommended every year.  Tetanus, diphtheria, and acellular pertussis (Tdap, Td) vaccine. You may need a Td booster every 10 years.  Varicella vaccine. You may need this if you have not been vaccinated.  Zoster vaccine. You may need this after age 27.  Measles, mumps, and rubella (MMR) vaccine. You may need at least one dose of MMR if you were born in 1957 or later. You may also need a second dose.  Pneumococcal 13-valent conjugate (PCV13) vaccine. One dose is recommended after age 64.  Pneumococcal polysaccharide (PPSV23) vaccine. One dose is recommended after age 50.  Meningococcal vaccine. You may need this if you have certain conditions.  Hepatitis A vaccine. You may need this if you have certain conditions or if you travel or work in places where you may be exposed to hepatitis A.  Hepatitis B vaccine. You may need this if you have certain conditions or if you travel or work in places where you may be exposed to hepatitis B.  Haemophilus influenzae type b (Hib) vaccine. You may need this if you have certain conditions.  Talk to your health care provider about which screenings and vaccines you need and how often you need them. This information is not intended to replace advice given to you by your health care provider. Make sure you discuss any questions you have with your health care provider. Document Released: 06/27/2015 Document Revised: 02/18/2016 Document Reviewed: 04/01/2015 Elsevier Interactive Patient Education  2017 Venetie K. Panosh M.D.

## 2017-04-14 ENCOUNTER — Encounter: Payer: Self-pay | Admitting: Internal Medicine

## 2017-04-14 ENCOUNTER — Ambulatory Visit (INDEPENDENT_AMBULATORY_CARE_PROVIDER_SITE_OTHER): Payer: Medicare Other | Admitting: Internal Medicine

## 2017-04-14 ENCOUNTER — Ambulatory Visit
Admission: RE | Admit: 2017-04-14 | Discharge: 2017-04-14 | Disposition: A | Discharge status: Home / Self Care | Payer: Medicare Other | Source: Ambulatory Visit | Attending: Internal Medicine | Admitting: Internal Medicine

## 2017-04-14 VITALS — BP 128/82 | HR 108 | Temp 98.1°F | Ht 64.75 in | Wt 150.7 lb

## 2017-04-14 DIAGNOSIS — Z1211 Encounter for screening for malignant neoplasm of colon: Secondary | ICD-10-CM | POA: Diagnosis not present

## 2017-04-14 DIAGNOSIS — L719 Rosacea, unspecified: Secondary | ICD-10-CM | POA: Diagnosis not present

## 2017-04-14 DIAGNOSIS — E785 Hyperlipidemia, unspecified: Secondary | ICD-10-CM | POA: Diagnosis not present

## 2017-04-14 DIAGNOSIS — Z0000 Encounter for general adult medical examination without abnormal findings: Secondary | ICD-10-CM | POA: Diagnosis not present

## 2017-04-14 DIAGNOSIS — Z79899 Other long term (current) drug therapy: Secondary | ICD-10-CM | POA: Diagnosis not present

## 2017-04-14 DIAGNOSIS — G479 Sleep disorder, unspecified: Secondary | ICD-10-CM | POA: Diagnosis not present

## 2017-04-14 DIAGNOSIS — Z23 Encounter for immunization: Secondary | ICD-10-CM | POA: Diagnosis not present

## 2017-04-14 DIAGNOSIS — E2839 Other primary ovarian failure: Secondary | ICD-10-CM | POA: Diagnosis not present

## 2017-04-14 DIAGNOSIS — Z1231 Encounter for screening mammogram for malignant neoplasm of breast: Secondary | ICD-10-CM

## 2017-04-14 LAB — BASIC METABOLIC PANEL
BUN: 24 mg/dL — AB (ref 6–23)
CO2: 33 meq/L — AB (ref 19–32)
Calcium: 9.7 mg/dL (ref 8.4–10.5)
Chloride: 107 mEq/L (ref 96–112)
Creatinine, Ser: 0.72 mg/dL (ref 0.40–1.20)
GFR: 86.19 mL/min (ref >60.00)
Glucose, Bld: 92 mg/dL (ref 70–99)
Potassium: 4.6 mEq/L (ref 3.5–5.1)
Sodium: 144 mEq/L (ref 135–145)

## 2017-04-14 LAB — CBC WITH DIFFERENTIAL/PLATELET
Basophils Absolute: 0.1 10*3/uL (ref 0.0–0.1)
Basophils Relative: 1.3 % (ref 0.0–3.0)
EOS ABS: 0.1 10*3/uL (ref 0.0–0.7)
Eosinophils Relative: 2.1 % (ref 0.0–5.0)
HCT: 40.2 % (ref 36.0–46.0)
Hemoglobin: 13.5 g/dL (ref 12.0–15.0)
LYMPHS ABS: 1.3 10*3/uL (ref 0.7–4.0)
Lymphocytes Relative: 27.4 % (ref 12.0–46.0)
MCHC: 33.5 g/dL (ref 30.0–36.0)
MCV: 97 fl (ref 78.0–100.0)
Monocytes Absolute: 0.4 10*3/uL (ref 0.1–1.0)
Monocytes Relative: 8 % (ref 3.0–12.0)
NEUTROS ABS: 2.9 10*3/uL (ref 1.4–7.7)
NEUTROS PCT: 61.2 % (ref 43.0–77.0)
PLATELETS: 238 10*3/uL (ref 150.0–400.0)
RBC: 4.15 Mil/uL (ref 3.87–5.11)
RDW: 13.4 % (ref 11.5–15.5)
WBC: 4.7 10*3/uL (ref 4.0–10.5)

## 2017-04-14 LAB — HEPATIC FUNCTION PANEL
ALT: 14 U/L (ref 0–35)
AST: 21 U/L (ref 0–37)
Albumin: 4.2 g/dL (ref 3.5–5.2)
Alkaline Phosphatase: 53 U/L (ref 39–117)
BILIRUBIN DIRECT: 0.1 mg/dL (ref 0.0–0.3)
Total Bilirubin: 0.5 mg/dL (ref 0.2–1.2)
Total Protein: 6.9 g/dL (ref 6.0–8.3)

## 2017-04-14 LAB — LIPID PANEL
CHOLESTEROL: 240 mg/dL — AB (ref 0–200)
HDL: 86.9 mg/dL (ref >39.00)
LDL Cholesterol: 139 mg/dL — ABNORMAL HIGH (ref 0–99)
NonHDL: 152.7
Total CHOL/HDL Ratio: 3
Triglycerides: 70 mg/dL (ref 0.0–149.0)
VLDL: 14 mg/dL (ref 0.0–40.0)

## 2017-04-14 MED ORDER — ZOLPIDEM TARTRATE 10 MG PO TABS: ORAL_TABLET | ORAL | 0 refills | Status: DC

## 2017-04-14 MED ORDER — ZOSTER VAC RECOMB ADJUVANTED 50 MCG/0.5ML IM SUSR
0.5000 mL | Freq: Once | INTRAMUSCULAR | 1 refills | Status: AC
Start: 2017-04-14 — End: 2017-04-14

## 2017-04-14 MED ORDER — METRONIDAZOLE 1 % EX GEL: CUTANEOUS | 10 refills | Status: DC

## 2017-04-14 NOTE — Patient Instructions (Addendum)
Continue lifestyle intervention healthy eating and exercise . Will notify you  of labs when available. Cautious use of ambien  Shingles vaccine when available at pharmacy   cologuard  .   Bone density if never done.  Scheduled here or elsewhere    Preventive Care 65 Years and Older, Female Preventive care refers to lifestyle choices and visits with your health care provider that can promote health and wellness. What does preventive care include?  A yearly physical exam. This is also called an annual well check.  Dental exams once or twice a year.  Routine eye exams. Ask your health care provider how often you should have your eyes checked.  Personal lifestyle choices, including: ? Daily care of your teeth and gums. ? Regular physical activity. ? Eating a healthy diet. ? Avoiding tobacco and drug use. ? Limiting alcohol use. ? Practicing safe sex. ? Taking low-dose aspirin every day. ? Taking vitamin and mineral supplements as recommended by your health care provider. What happens during an annual well check? The services and screenings done by your health care provider during your annual well check will depend on your age, overall health, lifestyle risk factors, and family history of disease. Counseling Your health care provider may ask you questions about your:  Alcohol use.  Tobacco use.  Drug use.  Emotional well-being.  Home and relationship well-being.  Sexual activity.  Eating habits.  History of falls.  Memory and ability to understand (cognition).  Work and work Statistician.  Reproductive health.  Screening You may have the following tests or measurements:  Height, weight, and BMI.  Blood pressure.  Lipid and cholesterol levels. These may be checked every 5 years, or more frequently if you are over 24 years old.  Skin check.  Lung cancer screening. You may have this screening every year starting at age 26 if you have a 30-pack-year history of  smoking and currently smoke or have quit within the past 15 years.  Fecal occult blood test (FOBT) of the stool. You may have this test every year starting at age 72.  Flexible sigmoidoscopy or colonoscopy. You may have a sigmoidoscopy every 5 years or a colonoscopy every 10 years starting at age 47.  Hepatitis C blood test.  Hepatitis B blood test.  Sexually transmitted disease (STD) testing.  Diabetes screening. This is done by checking your blood sugar (glucose) after you have not eaten for a while (fasting). You may have this done every 1-3 years.  Bone density scan. This is done to screen for osteoporosis. You may have this done starting at age 58.  Mammogram. This may be done every 1-2 years. Talk to your health care provider about how often you should have regular mammograms.  Talk with your health care provider about your test results, treatment options, and if necessary, the need for more tests. Vaccines Your health care provider may recommend certain vaccines, such as:  Influenza vaccine. This is recommended every year.  Tetanus, diphtheria, and acellular pertussis (Tdap, Td) vaccine. You may need a Td booster every 10 years.  Varicella vaccine. You may need this if you have not been vaccinated.  Zoster vaccine. You may need this after age 61.  Measles, mumps, and rubella (MMR) vaccine. You may need at least one dose of MMR if you were born in 1957 or later. You may also need a second dose.  Pneumococcal 13-valent conjugate (PCV13) vaccine. One dose is recommended after age 34.  Pneumococcal polysaccharide (PPSV23) vaccine.  One dose is recommended after age 16.  Meningococcal vaccine. You may need this if you have certain conditions.  Hepatitis A vaccine. You may need this if you have certain conditions or if you travel or work in places where you may be exposed to hepatitis A.  Hepatitis B vaccine. You may need this if you have certain conditions or if you travel or  work in places where you may be exposed to hepatitis B.  Haemophilus influenzae type b (Hib) vaccine. You may need this if you have certain conditions.  Talk to your health care provider about which screenings and vaccines you need and how often you need them. This information is not intended to replace advice given to you by your health care provider. Make sure you discuss any questions you have with your health care provider. Document Released: 06/27/2015 Document Revised: 02/18/2016 Document Reviewed: 04/01/2015 Elsevier Interactive Patient Education  2017 [employer].

## 2017-06-10 LAB — COLOGUARD: Cologuard: NEGATIVE

## 2017-06-22 ENCOUNTER — Encounter: Payer: Self-pay | Admitting: Internal Medicine

## 2017-06-29 ENCOUNTER — Telehealth: Payer: Self-pay | Admitting: Internal Medicine

## 2017-06-29 NOTE — Telephone Encounter (Signed)
Left detailed message of results. Advised to cal l back if any questions. Nothing further needed.

## 2017-06-29 NOTE — Telephone Encounter (Signed)
Cologuard is negative.  See report scanned in Epic.

## 2018-07-04 ENCOUNTER — Telehealth: Payer: Self-pay

## 2018-07-04 DIAGNOSIS — E785 Hyperlipidemia, unspecified: Secondary | ICD-10-CM

## 2018-07-04 DIAGNOSIS — Z79899 Other long term (current) drug therapy: Secondary | ICD-10-CM

## 2018-07-04 DIAGNOSIS — G479 Sleep disorder, unspecified: Secondary | ICD-10-CM

## 2018-07-04 DIAGNOSIS — E2839 Other primary ovarian failure: Secondary | ICD-10-CM

## 2018-07-04 NOTE — Telephone Encounter (Signed)
Copied from [place] [id number]. Topic: Referral - Request for Referral >> Jul 04, 2018 10:02 AM Scherrie Gerlach wrote: Pt would like a order for a bone density order sent to [facility].  The one from 04/2017 has expired.

## 2018-07-04 NOTE — Telephone Encounter (Signed)
Ok to order dexa

## 2018-07-04 NOTE — Telephone Encounter (Signed)
Please advise Dr Regis Bill, thanks.  Advise okay to order, thanks.

## 2018-07-05 NOTE — Telephone Encounter (Signed)
Orders placed.

## 2018-07-05 NOTE — Telephone Encounter (Signed)
Order placed for BD Pt aware.

## 2018-07-05 NOTE — Telephone Encounter (Signed)
Pt is requesting to have labs pre-visit so that she does not have to stick around after her appt for labs. She will be having labs done the same day as her appt with Dr Regis Bill.  Please advise Dr Regis Bill, thanks.

## 2018-07-07 NOTE — Telephone Encounter (Signed)
Pt aware. Nothing further needed 

## 2018-09-06 ENCOUNTER — Ambulatory Visit: Payer: Medicare Other | Admitting: Internal Medicine

## 2018-09-06 ENCOUNTER — Encounter: Payer: Medicare Other | Admitting: Internal Medicine

## 2018-09-07 ENCOUNTER — Other Ambulatory Visit: Payer: Medicare Other

## 2018-09-08 ENCOUNTER — Other Ambulatory Visit: Payer: Medicare Other

## 2018-09-25 ENCOUNTER — Encounter: Payer: Self-pay | Admitting: Internal Medicine

## 2018-09-25 ENCOUNTER — Other Ambulatory Visit: Payer: Self-pay

## 2018-09-25 ENCOUNTER — Ambulatory Visit (INDEPENDENT_AMBULATORY_CARE_PROVIDER_SITE_OTHER): Payer: Medicare Other | Admitting: Internal Medicine

## 2018-09-25 DIAGNOSIS — G479 Sleep disorder, unspecified: Secondary | ICD-10-CM

## 2018-09-25 DIAGNOSIS — Z79899 Other long term (current) drug therapy: Secondary | ICD-10-CM

## 2018-09-25 DIAGNOSIS — L859 Epidermal thickening, unspecified: Secondary | ICD-10-CM

## 2018-09-25 DIAGNOSIS — E2839 Other primary ovarian failure: Secondary | ICD-10-CM | POA: Diagnosis not present

## 2018-09-25 DIAGNOSIS — Z1159 Encounter for screening for other viral diseases: Secondary | ICD-10-CM

## 2018-09-25 DIAGNOSIS — E785 Hyperlipidemia, unspecified: Secondary | ICD-10-CM | POA: Diagnosis not present

## 2018-09-25 MED ORDER — ZOLPIDEM TARTRATE 10 MG PO TABS: ORAL_TABLET | ORAL | 0 refills | Status: DC

## 2018-09-25 MED ORDER — UREA 40 % EX LOTN: 1.0000 "application " | TOPICAL_LOTION | Freq: Every evening | CUTANEOUS | 3 refills | Status: DC | PRN

## 2018-09-25 NOTE — Progress Notes (Signed)
Virtual Visit via Video Note  I connected with@ on 09/25/18 at 10:30 AM EDT by a video enabled telemedicine application and verified that I am speaking with the correct person using two identifiers. Location patient: home Location provider:work  office Persons participating in the virtual visit: patient, provider  [place] national recommendations  regarding COVID 19 pandemic   video visit is advised over in office visit for this patient.  Discussed the limitations of evaluation and management by telemedicine and  availability of in person appointments.    HPI: [name] Patient checks in for her yearly visit that has been delayed because of the COVID-19 restrictions. She currently lives in [place] and comes down to [place] once or twice for visits.  Generally she is doing well blood pressures good does ask for refill of the Ambien if needed only uses under certain situations.  She also requests 40% urea lotion that was given to her by dermatologist for very cracked heels.  She is aware she has to pay out-of-pocket but works better than the over-the-counter's.  Her lipids need to be rechecked trying to pay attention despite the fact she is at home stays active with treadmill either in [place] or near [facility] place.  She did have a Cologuard last year and the first Shingrix.  She is due for the Pneumovax 23.  She has a bone density and mammogram scheduled that got delayed and will be setting that up for later.  No specific cardiovascular pulmonary symptoms of concern at this time.    ROS: See pertinent positives and negatives per HPI.  Past Medical History:  Diagnosis Date  . CERVICAL POLYP 03/11/2008   Qualifier: Diagnosis of  By: Regis Bill MD, Standley Brooking   . Colon polyps 2005   on colonscopy Dr. Fuller Plan  . Fibroid 2004   Per Dr. Ouida Sills  . History of shingles    face and mouth  . Hx of skin cancer, basal cell   . Rosacea   . Scoliosis    noted on  mri done for back pain    Past Surgical History:  Procedure Laterality Date  . BUNIONECTOMY      Family History  Problem Relation Age of Onset  . Hypertension Father   . Osteoporosis Unknown   . 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  Flaxseed, Linseed, (FLAX SEED OIL PO), Take 1 tablet by mouth daily., Disp: , Rfl:  .  Krill Oil CAPS, Take 1 capsule by mouth daily., Disp: , Rfl:  .  metroNIDAZOLE (METROGEL) 1 % gel, APPLY TOPICALLY EVERY DAY, Disp: 60 g, Rfl: 10 .  Multiple Vitamins-Calcium (VIACTIV MULTI-VITAMIN) CHEW, Chew by mouth., Disp: , Rfl:  .  Multiple Vitamins-Minerals (CENTRUM SILVER ULTRA WOMENS PO), Take by mouth., Disp: , Rfl:  .  Multiple Vitamins-Minerals (PRESERVISION/LUTEIN PO), Take by mouth., Disp: , Rfl:  .  Omega-3 Fatty Acids (FISH OIL) 1200 MG CAPS, Take 1 capsule by mouth daily., Disp: , Rfl:  .  zolpidem (AMBIEN) 10 MG tablet, TAKE 1/2-1 TABLET BY MOUTH AT BEDTIME AS NEEDED FOR SLEEP, Disp: 30 tablet, Rfl: 0 .  Urea 40 % LOTN, Apply 1 application topically at bedtime as needed., Disp: 325 mL, Rfl: 3  EXAM:  VITALS per patient if applicable:  GENERAL: alert, oriented, appears well and in no acute distress  HEENT: atraumatic, conjunttiva clear, no obvious abnormalities on inspection of external nose and ears  NECK: normal movements of the head and neck  LUNGS: on inspection no signs of respiratory distress, breathing rate appears normal, no obvious gross SOB, gasping or wheezing  CV: no obvious cyanosis  MS: moves all visible extremities without noticeable abnormality  PSYCH/NEURO: pleasant and cooperative, no obvious depression or anxiety, speech and thought processing grossly intact Lab Results  Component Value Date   WBC 4.7 04/14/2017   HGB 13.5 04/14/2017   HCT 40.2  04/14/2017   PLT 238.0 04/14/2017   GLUCOSE 92 04/14/2017   CHOL 240 (H) 04/14/2017   TRIG 70.0 04/14/2017   HDL 86.90 04/14/2017   LDLDIRECT 162.3 03/21/2012   LDLCALC 139 (H) 04/14/2017   ALT 14 04/14/2017   AST 21 04/14/2017   NA 144 04/14/2017   K 4.6 04/14/2017   CL 107 04/14/2017   CREATININE 0.72 04/14/2017   BUN 24 (H) 04/14/2017   CO2 33 (H) 04/14/2017   TSH 1.14 01/05/2016   Immunization History  Administered Date(s) Administered  . Influenza Split 03/21/2012  . Influenza Whole 03/11/2008  . Influenza, High Dose Seasonal PF 04/14/2017  . Pneumococcal Conjugate-13 04/14/2017  . Td 06/28/2006  . Typhoid Live 11/29/2014  . Zoster 03/21/2012    ASSESSMENT AND PLAN:  Discussed the following assessment and plan:  Medication management  Hyperlipidemia, unspecified hyperlipidemia type - future ;abs  Disturbance in sleep behavior - intermittinet  use  ambien  Estrogen deficiency - will get her dexa when possible this summer   Need for hepatitis C screening test - Plan: Hepatitis C antibody  Hyperkeratosis feet - prev rx per dr Tonia Brooms  will rx  ( will have to self pay pt aware)  Reviewed health care maintenance and monitoring lab we will refill her Ambien risk-benefit patient aware Send in her urea lotion 40%. She will plan for labs her mammogram and bone density at a safer time .  Future orders had already been placed we will add a hepatitis C screen not high risk except for birth cohort.  Disc  preventive care due and parameters Expectant management and discussion of plan and treatment with patient with opportunity to ask questions and all were answered. The patient agreed with the plan and demonstrated an understanding of the instructions.  Total visit 17mins > 50% spent counseling and coordinating care as indicated in above note and in instructions to patient .  The patient was advised to contact  If having concerns    In the interim.    Shanon Ace, MD

## 2018-09-28 ENCOUNTER — Other Ambulatory Visit: Payer: Self-pay | Admitting: Internal Medicine

## 2018-09-28 MED ORDER — METRONIDAZOLE 1 % EX GEL: CUTANEOUS | 10 refills | Status: DC

## 2018-09-28 NOTE — Telephone Encounter (Signed)
Requested Prescriptions  Pending Prescriptions Disp Refills  . metroNIDAZOLE (METROGEL) 1 % gel 60 g 10    Sig: APPLY TOPICALLY EVERY DAY     Off-Protocol Failed - 09/28/2018  2:02 PM      Failed - Medication not assigned to a protocol, review manually.      Passed - Valid encounter within last 12 months    Recent Outpatient Visits          3 days ago Medication Office manager [facility], Standley Brooking, MD   1 year ago Visit for preventive health examination   [facility], Standley Brooking, MD   2 years ago Visit for preventive health examination   [facility], Standley Brooking, MD   3 years ago Visit for preventive health examination   [facility] [place], Standley Brooking, MD   5 years ago Encounter for preventive health examination   Therapist, music at [facility], Standley Brooking, MD

## 2018-10-17 ENCOUNTER — Telehealth: Payer: Self-pay

## 2018-10-17 NOTE — Telephone Encounter (Signed)
PA for zolpidem (AMBIEN) 10 MG tablet has been sent to cover my meds.   Key: [id number] - PA Case ID: [id number] - Rx #: [id number]

## 2018-10-19 NOTE — Telephone Encounter (Signed)
PA has been denied. No reason was given. Will resend PA.

## 2018-10-20 NOTE — Telephone Encounter (Signed)
I spoke with the patients insurance company and was informed that the PA was denied because the patient must have tried and failed either or both, Belsomra or Rozerem.  If the provider does not wish to change the patients medication to one of the above medications, the provider will need to submit a letter stating why it is medically necessary for the patient to take Zolpidem instead of one of the covered medications.  Please advise.

## 2018-10-24 NOTE — Telephone Encounter (Signed)
Inform the patient about the denial and  Ask her if she wants to try  The other meds covered albeit they are different and usually for  Regular use .   She could also look into cost of self pay  With [employer]  RX coupon to check out   ( here may be in the 10$ range and worth it  )    or try another of the  Above med   ( may be  More cost)

## 2018-12-07 ENCOUNTER — Other Ambulatory Visit (INDEPENDENT_AMBULATORY_CARE_PROVIDER_SITE_OTHER): Payer: Medicare Other

## 2018-12-07 ENCOUNTER — Other Ambulatory Visit: Payer: Self-pay

## 2018-12-07 ENCOUNTER — Ambulatory Visit
Admission: RE | Admit: 2018-12-07 | Discharge: 2018-12-07 | Disposition: A | Discharge status: Home / Self Care | Payer: Medicare Other | Source: Ambulatory Visit | Attending: Internal Medicine | Admitting: Internal Medicine

## 2018-12-07 DIAGNOSIS — E2839 Other primary ovarian failure: Secondary | ICD-10-CM

## 2018-12-07 DIAGNOSIS — G479 Sleep disorder, unspecified: Secondary | ICD-10-CM

## 2018-12-07 DIAGNOSIS — Z1159 Encounter for screening for other viral diseases: Secondary | ICD-10-CM

## 2018-12-07 DIAGNOSIS — E785 Hyperlipidemia, unspecified: Secondary | ICD-10-CM

## 2018-12-07 DIAGNOSIS — Z79899 Other long term (current) drug therapy: Secondary | ICD-10-CM | POA: Diagnosis not present

## 2018-12-07 LAB — CBC WITH DIFFERENTIAL/PLATELET
Basophils Absolute: 0.1 10*3/uL (ref 0.0–0.1)
Basophils Relative: 2.4 % (ref 0.0–3.0)
Eosinophils Absolute: 0.1 10*3/uL (ref 0.0–0.7)
Eosinophils Relative: 2.1 % (ref 0.0–5.0)
HCT: 40.4 % (ref 36.0–46.0)
Hemoglobin: 13.6 g/dL (ref 12.0–15.0)
Lymphocytes Relative: 31.5 % (ref 12.0–46.0)
Lymphs Abs: 1.5 10*3/uL (ref 0.7–4.0)
MCHC: 33.7 g/dL (ref 30.0–36.0)
MCV: 93.9 fl (ref 78.0–100.0)
Monocytes Absolute: 0.3 10*3/uL (ref 0.1–1.0)
Monocytes Relative: 6.3 % (ref 3.0–12.0)
Neutro Abs: 2.7 10*3/uL (ref 1.4–7.7)
Neutrophils Relative %: 57.7 % (ref 43.0–77.0)
Platelets: 256 10*3/uL (ref 150.0–400.0)
RBC: 4.3 Mil/uL (ref 3.87–5.11)
RDW: 13.4 % (ref 11.5–15.5)
WBC: 4.7 10*3/uL (ref 4.0–10.5)

## 2018-12-07 LAB — BASIC METABOLIC PANEL
BUN: 21 mg/dL (ref 6–23)
CO2: 28 mEq/L (ref 19–32)
Calcium: 9.2 mg/dL (ref 8.4–10.5)
Chloride: 105 mEq/L (ref 96–112)
Creatinine, Ser: 0.7 mg/dL (ref 0.40–1.20)
GFR: 83.36 mL/min (ref >60.00)
Glucose, Bld: 86 mg/dL (ref 70–99)
Potassium: 4.3 mEq/L (ref 3.5–5.1)
Sodium: 141 mEq/L (ref 135–145)

## 2018-12-07 LAB — LIPID PANEL
Cholesterol: 265 mg/dL — ABNORMAL HIGH (ref 0–200)
HDL: 93.8 mg/dL (ref >39.00)
LDL Cholesterol: 160 mg/dL — ABNORMAL HIGH (ref 0–99)
NonHDL: 170.92
Total CHOL/HDL Ratio: 3
Triglycerides: 53 mg/dL (ref 0.0–149.0)
VLDL: 10.6 mg/dL (ref 0.0–40.0)

## 2018-12-07 LAB — HEPATIC FUNCTION PANEL
ALT: 13 U/L (ref 0–35)
AST: 21 U/L (ref 0–37)
Albumin: 4.4 g/dL (ref 3.5–5.2)
Alkaline Phosphatase: 61 U/L (ref 39–117)
Bilirubin, Direct: 0.1 mg/dL (ref 0.0–0.3)
Total Bilirubin: 0.5 mg/dL (ref 0.2–1.2)
Total Protein: 7.2 g/dL (ref 6.0–8.3)

## 2018-12-07 LAB — TSH: TSH: 1.28 u[IU]/mL (ref 0.35–4.50)

## 2018-12-08 LAB — HEPATITIS C ANTIBODY
Hepatitis C Ab: NONREACTIVE
SIGNAL TO CUT-OFF: 0.02 (ref <1.00)

## 2018-12-20 ENCOUNTER — Ambulatory Visit (INDEPENDENT_AMBULATORY_CARE_PROVIDER_SITE_OTHER): Payer: Medicare Other | Admitting: Internal Medicine

## 2018-12-20 ENCOUNTER — Other Ambulatory Visit: Payer: Self-pay

## 2018-12-20 ENCOUNTER — Encounter: Payer: Self-pay | Admitting: Internal Medicine

## 2018-12-20 VITALS — BP 118/69

## 2018-12-20 DIAGNOSIS — Z79899 Other long term (current) drug therapy: Secondary | ICD-10-CM

## 2018-12-20 DIAGNOSIS — M810 Age-related osteoporosis without current pathological fracture: Secondary | ICD-10-CM | POA: Diagnosis not present

## 2018-12-20 DIAGNOSIS — E785 Hyperlipidemia, unspecified: Secondary | ICD-10-CM

## 2018-12-20 NOTE — Progress Notes (Signed)
Virtual Visit via Video Note  I connected with@ on 12/20/18 at  2:30 PM EDT by a video enabled telemedicine application and verified that I am speaking with the correct person using two identifiers. Location patient: home  In [place].  Location provider:work office Persons participating in the virtual visit: patient, provider  [place] national recommendations  regarding COVID 19 pandemic   video visit is advised over in office visit for this patient.  Patient aware  of the limitations of evaluation and management by telemedicine and  availability of in person appointments. and agreed to proceed.   HPI: [name] presents for video visit. To discuss her lab and dexa results.  HLD: up 10 % but hdl is also up despite 15 # up since covid but  Recently eating better.  She is active walking weight bearing no physical issues . Limiting significantly.   fam hx father MI scd 15 uncontrolled ht sister died in her sleep felt scd ws over weight  Age 68   Dexa: noted  Comment about spine that worried her .  Was taking some vit d 500 iu per day and has inc to  About 2700 per day with supplements since seeing results.   Mom  In her 35s  No hx of hip  Fracture   Asks if she needs to get evaluated and add medication   Or if ok to do other intervention for bone health    ROS: See pertinent positives and negatives per HPI. No cp sob hx fracture had hx of back arthritis  No change   Past Medical History:  Diagnosis Date  . CERVICAL POLYP 03/11/2008   Qualifier: Diagnosis of  By: Regis Bill MD, Standley Brooking   . Colon polyps 2005   on colonscopy Dr. Fuller Plan  . Fibroid 2004   Per Dr. Ouida Sills  . History of shingles    face and mouth  . Hx of skin cancer, basal cell   . Rosacea   . Scoliosis    noted on mri done for back pain    Past Surgical History:  Procedure Laterality Date  . BUNIONECTOMY      Family History  Problem Relation Age of Onset  . Hypertension Father   . Osteoporosis Unknown   .  Breast cancer Neg Hx     Social History   Tobacco Use  . Smoking status: Never Smoker  . Smokeless tobacco: Never Used  Substance Use Topics  . Alcohol use: Yes  . Drug use: Not on file      Current Outpatient Medications:  .  Flaxseed, Linseed, (FLAX SEED OIL PO), Take 1 tablet by mouth daily., Disp: , Rfl:  .  Krill Oil CAPS, Take 1 capsule by mouth daily., Disp: , Rfl:  .  metroNIDAZOLE (METROGEL) 1 % gel, APPLY TOPICALLY EVERY DAY, Disp: 60 g, Rfl: 10 .  Multiple Vitamins-Calcium (VIACTIV MULTI-VITAMIN) CHEW, Chew by mouth., Disp: , Rfl:  .  Multiple Vitamins-Minerals (CENTRUM SILVER ULTRA WOMENS PO), Take by mouth., Disp: , Rfl:  .  Multiple Vitamins-Minerals (PRESERVISION/LUTEIN PO), Take by mouth., Disp: , Rfl:  .  Omega-3 Fatty Acids (FISH OIL) 1200 MG CAPS, Take 1 capsule by mouth daily., Disp: , Rfl:  .  Urea 40 % LOTN, Apply 1 application topically at bedtime as needed., Disp: 325 mL, Rfl: 3 .  zolpidem (AMBIEN) 10 MG tablet, TAKE 1/2-1 TABLET BY MOUTH AT BEDTIME AS NEEDED FOR SLEEP, Disp: 30 tablet, Rfl: 0  EXAM: BP  Readings from Last 3 Encounters:  12/20/18 118/69  04/14/17 128/82  01/05/16 124/78    VITALS per patient if applicable:weight is 076 and down  bp 118 GENERAL: alert, oriented, appears well and in no acute distress HEENT: atraumatic, conjunttiva clear, no obvious abnormalities on inspection of external nose and ears NECK: normal movements of the head and neck LUNGS: on inspection no signs of respiratory distress, breathing rate appears normal, no obvious gross SOB, gasping or wheezing CV: no obvious cyanosis MS: moves all visible extremities without noticeable abnormality PSYCH/NEURO: pleasant and cooperative, no obvious depression or anxiety, speech and thought processing grossly intact Lab Results  Component Value Date   WBC 4.7 12/07/2018   HGB 13.6 12/07/2018   HCT 40.4 12/07/2018   PLT 256.0 12/07/2018   GLUCOSE 86 12/07/2018   CHOL 265 (H)  12/07/2018   TRIG 53.0 12/07/2018   HDL 93.80 12/07/2018   LDLDIRECT 162.3 03/21/2012   LDLCALC 160 (H) 12/07/2018   ALT 13 12/07/2018   AST 21 12/07/2018   NA 141 12/07/2018   K 4.3 12/07/2018   CL 105 12/07/2018   CREATININE 0.70 12/07/2018   BUN 21 12/07/2018   CO2 28 12/07/2018   TSH 1.28 12/07/2018  Right Forearm Radius 33% 12/07/2018 67.4 -2.5 0.670 g/cm2  DualFemur Neck Right 12/07/2018 67.4 -1.5 0.826 g/cm2  DualFemur Total Mean 12/07/2018 67.4 -0.8 0.909 g/cm2 The 10-year ASCVD risk score Mikey Bussing DC Jr., et al., 2013) is: 5.6%   Values used to calculate the score:     Age: 68 years     Sex: Female     Is Non-Hispanic African American: No     Diabetic: No     Tobacco smoker: No     Systolic Blood Pressure: 226 mmHg     Is BP treated: No     HDL Cholesterol: 93.8 mg/dL     Total Cholesterol: 265 mg/dL  ASSESSMENT AND PLAN:  Discussed the following assessment and plan:    ICD-10-CM   1. Hyperlipidemia, unspecified hyperlipidemia type  E78.5 Lipid panel    VITAMIN D 25 Hydroxy (Vit-D Deficiency, Fractures)    Lipoprotein A (LPA)   see text fam hx   2. Medication management  Z79.899 Lipid panel    VITAMIN D 25 Hydroxy (Vit-D Deficiency, Fractures)  3. Osteoporosis without current pathological fracture, unspecified osteoporosis type  M81.0 Lipid panel    VITAMIN D 25 Hydroxy (Vit-D Deficiency, Fractures)   forearm  only     Counseled.   Risk   assessment  cv lsi the best and  Disc cac scores and consider lipoprotein A Bone health cont resistance  Training and  Upper body.    Vit d and check in 6 mos  .   Repeat dexa in 2 years  Low risk family        Expectant management and discussion of plan and treatment with opportunity to ask questions and all were answered. The patient agreed with the plan and demonstrated an understanding of the instructions.  6 months labs and then reassess Advised to call back or seek an in-person evaluation if worsening  or having   further concerns .  I provided 90minutes of non-face-to-face time during this encounter. Total visit 54mins > 50% spent counseling and coordinating care as indicated in above note and in instructions to patient .    Shanon Ace, MD

## 2019-07-02 DIAGNOSIS — Z6820 Body mass index (BMI) 20.0-20.9, adult: Secondary | ICD-10-CM | POA: Diagnosis not present

## 2019-07-02 DIAGNOSIS — E785 Hyperlipidemia, unspecified: Secondary | ICD-10-CM | POA: Diagnosis not present

## 2019-07-02 DIAGNOSIS — F5104 Psychophysiologic insomnia: Secondary | ICD-10-CM | POA: Diagnosis not present

## 2019-12-04 ENCOUNTER — Telehealth: Payer: Self-pay | Admitting: Internal Medicine

## 2019-12-04 DIAGNOSIS — E785 Hyperlipidemia, unspecified: Secondary | ICD-10-CM

## 2019-12-04 DIAGNOSIS — M810 Age-related osteoporosis without current pathological fracture: Secondary | ICD-10-CM

## 2019-12-04 DIAGNOSIS — E559 Vitamin D deficiency, unspecified: Secondary | ICD-10-CM

## 2019-12-04 DIAGNOSIS — Z79899 Other long term (current) drug therapy: Secondary | ICD-10-CM

## 2019-12-04 DIAGNOSIS — G479 Sleep disorder, unspecified: Secondary | ICD-10-CM

## 2019-12-04 NOTE — Telephone Encounter (Signed)
Please see message and order labs and then I will have her schedule a lab appointment.

## 2019-12-04 NOTE — Telephone Encounter (Signed)
Pt would like to do her labs before her annual physical appt on June 28th.

## 2019-12-05 NOTE — Telephone Encounter (Signed)
I ordered labs  Fasting advised as requested

## 2019-12-05 NOTE — Addendum Note (Signed)
Addended byShanon Ace K on: 12/05/2019 05:30 PM   Modules accepted: Orders

## 2019-12-06 NOTE — Telephone Encounter (Signed)
Called patient and LMOVM to return call  Called patient and left a detailed voice message for patient to call back to schedule a lab appointment since labs have been ordered for patient prior to physical on June 28th.

## 2019-12-07 ENCOUNTER — Ambulatory Visit: Payer: Medicare Other | Admitting: Internal Medicine

## 2019-12-10 ENCOUNTER — Ambulatory Visit (INDEPENDENT_AMBULATORY_CARE_PROVIDER_SITE_OTHER): Payer: Medicare PPO | Admitting: Internal Medicine

## 2019-12-10 ENCOUNTER — Encounter: Payer: Self-pay | Admitting: Internal Medicine

## 2019-12-10 ENCOUNTER — Ambulatory Visit: Payer: Medicare Other | Admitting: Internal Medicine

## 2019-12-10 ENCOUNTER — Other Ambulatory Visit: Payer: Self-pay

## 2019-12-10 VITALS — BP 120/76 | HR 65 | Temp 98.1°F | Ht 64.5 in | Wt 119.0 lb

## 2019-12-10 DIAGNOSIS — M7989 Other specified soft tissue disorders: Secondary | ICD-10-CM | POA: Diagnosis not present

## 2019-12-10 DIAGNOSIS — E785 Hyperlipidemia, unspecified: Secondary | ICD-10-CM

## 2019-12-10 DIAGNOSIS — Z0000 Encounter for general adult medical examination without abnormal findings: Secondary | ICD-10-CM | POA: Diagnosis not present

## 2019-12-10 DIAGNOSIS — G479 Sleep disorder, unspecified: Secondary | ICD-10-CM | POA: Diagnosis not present

## 2019-12-10 DIAGNOSIS — M810 Age-related osteoporosis without current pathological fracture: Secondary | ICD-10-CM | POA: Diagnosis not present

## 2019-12-10 DIAGNOSIS — E559 Vitamin D deficiency, unspecified: Secondary | ICD-10-CM

## 2019-12-10 DIAGNOSIS — Z79899 Other long term (current) drug therapy: Secondary | ICD-10-CM

## 2019-12-10 LAB — BASIC METABOLIC PANEL
BUN: 23 mg/dL (ref 6–23)
CO2: 29 mEq/L (ref 19–32)
Calcium: 9.5 mg/dL (ref 8.4–10.5)
Chloride: 100 mEq/L (ref 96–112)
Creatinine, Ser: 0.71 mg/dL (ref 0.40–1.20)
GFR: 81.76 mL/min (ref >60.00)
Glucose, Bld: 76 mg/dL (ref 70–99)
Potassium: 3.7 mEq/L (ref 3.5–5.1)
Sodium: 137 mEq/L (ref 135–145)

## 2019-12-10 LAB — CBC WITH DIFFERENTIAL/PLATELET
Basophils Absolute: 0.1 10*3/uL (ref 0.0–0.1)
Basophils Relative: 1.3 % (ref 0.0–3.0)
Eosinophils Absolute: 0 10*3/uL (ref 0.0–0.7)
Eosinophils Relative: 0.9 % (ref 0.0–5.0)
HCT: 39.2 % (ref 36.0–46.0)
Hemoglobin: 13.4 g/dL (ref 12.0–15.0)
Lymphocytes Relative: 29.5 % (ref 12.0–46.0)
Lymphs Abs: 1.3 10*3/uL (ref 0.7–4.0)
MCHC: 34.3 g/dL (ref 30.0–36.0)
MCV: 95.1 fl (ref 78.0–100.0)
Monocytes Absolute: 0.3 10*3/uL (ref 0.1–1.0)
Monocytes Relative: 6.2 % (ref 3.0–12.0)
Neutro Abs: 2.7 10*3/uL (ref 1.4–7.7)
Neutrophils Relative %: 62.1 % (ref 43.0–77.0)
Platelets: 220 10*3/uL (ref 150.0–400.0)
RBC: 4.12 Mil/uL (ref 3.87–5.11)
RDW: 13.2 % (ref 11.5–15.5)
WBC: 4.4 10*3/uL (ref 4.0–10.5)

## 2019-12-10 LAB — LIPID PANEL
Cholesterol: 184 mg/dL (ref 0–200)
HDL: 83.4 mg/dL (ref >39.00)
LDL Cholesterol: 93 mg/dL (ref 0–99)
NonHDL: 101.03
Total CHOL/HDL Ratio: 2
Triglycerides: 39 mg/dL (ref 0.0–149.0)
VLDL: 7.8 mg/dL (ref 0.0–40.0)

## 2019-12-10 LAB — VITAMIN D 25 HYDROXY (VIT D DEFICIENCY, FRACTURES): VITD: 85.94 ng/mL (ref 30.00–100.00)

## 2019-12-10 LAB — HEPATIC FUNCTION PANEL
ALT: 20 U/L (ref 0–35)
AST: 25 U/L (ref 0–37)
Albumin: 4.6 g/dL (ref 3.5–5.2)
Alkaline Phosphatase: 55 U/L (ref 39–117)
Bilirubin, Direct: 0.2 mg/dL (ref 0.0–0.3)
Total Bilirubin: 0.9 mg/dL (ref 0.2–1.2)
Total Protein: 6.9 g/dL (ref 6.0–8.3)

## 2019-12-10 MED ORDER — ZOLPIDEM TARTRATE 10 MG PO TABS: ORAL_TABLET | ORAL | 0 refills | Status: DC

## 2019-12-10 MED ORDER — ATORVASTATIN CALCIUM 10 MG PO TABS: 10.0000 mg | ORAL_TABLET | Freq: Every day | ORAL | 3 refills | Status: DC

## 2019-12-10 NOTE — Progress Notes (Signed)
Chief Complaint  Patient presents with  . Medicare Wellness    Doing well  . Medication Refill    HPI: [name] 69 y.o. comes in today for Preventive Medicare exam/ wellness medication eval  visit .Since last visit. Has moved back to  [place]  husbands mobility and health issues   Would like refill ambien  As needed for stressful nights.  Exercising  Left little toe with tndern and  A bit swollen  Ok to walk dosen remember trauma  ? If could be infected   Has scoliosis  ? Icurvature   Had dxa last year   Neg fam hx fracture   Taking atova  Waiting for lipid results  No sig se   Health Maintenance  Topic Date Due  . COVID-19 Vaccine (1) Never done  . TETANUS/TDAP  06/28/2016  . MAMMOGRAM  04/15/2019  . COLONOSCOPY  06/10/2022 (Originally 12/23/2013)  . INFLUENZA VACCINE  01/13/2020  . DEXA SCAN  Completed  . Hepatitis C Screening  Completed  . PNA vac Low Risk Adult  Completed   Health Maintenance Review LIFESTYLE:  Exercise:    A lot  Walks 5-6 mle  per day .  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 Last eye check UTD  Safety:  Has smoke detector and wears seat belts.  No excess sun exposure. Sees dentist regularly.  Falls:  no  Advance directiveHas one.  Memory: Felt to be good  , no concern from her or her family.  Depression: No anhedonia unusual crying or depressive symptoms  Nutrition: Eats well balanced diet; adequate calcium and vitamin D. No swallowing chewing problems.  Injury: no major injuries in the last six months.  Other healthcare providers:  Reviewed today .  Preventive parameters: up-to-date  Reviewed   ADLS:   There are no problems or need for assistance  driving, feeding, obtaining food, dressing, toileting and bathing, managing money using phone.  She is independent.    ROS:  GEN/ HEENT: No fever, significant weight changes sweats headaches vision problems hearing changes, CV/ PULM; No chest pain shortness of breath cough, syncope,edema  change in exercise tolerance. GI /GU: No adominal pain, vomiting, change in bowel habits. No blood in the stool. No significant GU symptoms. SKIN/HEME: ,no acute skin rashes suspicious lesions or bleeding. No lymphadenopathy, nodules, masses.  NEURO/ PSYCH:  No neurologic signs such as weakness numbness. No depression anxiety. IMM/ Allergy: No unusual infections.  Allergy .   REST of 12 system review negative except as per HPI   Past Medical History:  Diagnosis Date  . CERVICAL POLYP 03/11/2008   Qualifier: Diagnosis of  By: Regis Bill MD, Standley Brooking   . Colon polyps 2005   on colonscopy Dr. Fuller Plan  . Fibroid 2004   Per Dr. Ouida Sills  . History of shingles    face and mouth  . Hx of skin cancer, basal cell   . Rosacea   . Scoliosis    noted on mri done for back pain    Family History  Problem Relation Age of Onset  . Hypertension Father   . Osteoporosis Other   . Breast cancer Neg Hx     Social History  Socioeconomic History  . Marital status: Married    Spouse name: Not on file  . Number of children: Not on file  . Years of education: Not on file  . Highest education level: Not on file  Occupational History  . Not on file  Tobacco Use  . Smoking status: Never Smoker  . Smokeless tobacco: Never Used  Vaping Use  . Vaping Use: Never used  Substance and Sexual Activity  . Alcohol use: Yes  . Drug use: Not on file  . Sexual activity: Not on file  Other Topics Concern  . Not on file  Social History Narrative   Married   Spouse had CABG    [employer]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   Has moved to [place]   hh of 2    2 cats      Social Determinants of Radio broadcast assistant Strain:   . Difficulty of Paying Living Expenses:   Food Insecurity:   . Worried About  Charity fundraiser in the Last Year:   . Arboriculturist in the Last Year:   Transportation Needs:   . Film/video editor (Medical):   Marland Kitchen Lack of Transportation (Non-Medical):   Physical Activity:   . Days of Exercise per Week:   . Minutes of Exercise per Session:   Stress:   . Feeling of Stress :   Social Connections:   . Frequency of Communication with Friends and Family:   . Frequency of Social Gatherings with Friends and Family:   . Attends Religious Services:   . Active Member of Clubs or Organizations:   . Attends Archivist Meetings:   Marland Kitchen Marital Status:     Outpatient Encounter Medications as of 12/10/2019  Medication Sig  . atorvastatin (LIPITOR) 10 MG tablet Take 10 mg by mouth daily.   . cholecalciferol (VITAMIN D3) 25 MCG (1000 UNIT) tablet Take 1,000 Units by mouth daily.  . [facility] COENZYME Q-10 100 MG capsule Take 100 mg by mouth daily.   . Flaxseed, Linseed, (FLAX SEED OIL PO) Take 1 tablet by mouth daily.  [name] CAPS Take 1 capsule by mouth daily.  . metroNIDAZOLE (METROGEL) 1 % gel APPLY TOPICALLY EVERY DAY  . Multiple Vitamins-Calcium (VIACTIV MULTI-VITAMIN) CHEW Chew by mouth.  . Multiple Vitamins-Minerals (CENTRUM SILVER ULTRA WOMENS PO) Take by mouth.  . Omega-3 Fatty Acids (FISH OIL) 1200 MG CAPS Take 1 capsule by mouth daily.  . Urea 40 % LOTN Apply 1 application topically at bedtime as needed.  . zolpidem (AMBIEN) 10 MG tablet TAKE 1/2-1 TABLET BY MOUTH AT BEDTIME AS NEEDED FOR SLEEP  . [DISCONTINUED] zolpidem (AMBIEN) 10 MG tablet TAKE 1/2-1 TABLET BY MOUTH AT BEDTIME AS NEEDED FOR SLEEP  . Multiple Vitamins-Minerals (PRESERVISION/LUTEIN PO) Take by mouth. (Patient not taking: Reported on 12/10/2019)   No facility-administered encounter medications on file as of 12/10/2019.    EXAM:  BP 120/76   Pulse 65   Temp 98.1 F (36.7 C) (Temporal)   Ht 5' 4.5" (1.638 m)   Wt 119 lb (54 kg)   SpO2 99%   BMI 20.11 kg/m   Body mass index is  20.11 kg/m.  Physical Exam: Vital signs reviewed OMV:[facility] is a well-developed well-nourished alert cooperative   who appears stated age in no acute distress.  HEENT: normocephalic atraumatic , Eyes: PERRL EOM's full, conjunctiva clear, Nares: paten,t no deformity discharge or tenderness., Ears:  no deformity EAC's clear TMs with normal landmarks. Mouth:masked NECK: supple without masses, thyromegaly or bruits. CHEST/PULM:  Clear to auscultation and percussion breath sounds equal no wheeze , rales or rhonchi. No chest wall deformities or tenderness. CV: PMI is nondisplaced, S1 S2 no gallops, murmurs, rubs. Peripheral pulses are full without delay.No JVD .  Breast: normal by inspection . No dimpling, discharge, masses, tenderness or discharge . ABDOMEN: Bowel sounds normal nontender  No guard or rebound, no hepato splenomegal no CVA tenderness.   Extremtities:  No clubbing cyanosis or edema, no acute joint swelling or redness no focal atrophy left little toe with callous medially and slight swelling  Lateral  Corn  NEURO:  Oriented x3, cranial nerves 3-12 appear to be intact, no obvious focal weakness,gait within normal limits no abnormal reflexes or asymmetrical SKIN: No acute rashes normal turgor, color, no bruising or petechiae. PSYCH: Oriented, good eye contact, no obvious depression anxiety, cognition and judgment appear normal. LN: no cervical axillary inguinal adenopathy No noted deficits in memory, attention, and speech.   Lab Results  Component Value Date   WBC 4.4 12/10/2019   HGB 13.4 12/10/2019   HCT 39.2 12/10/2019   PLT 220.0 12/10/2019   GLUCOSE 76 12/10/2019   CHOL 184 12/10/2019   TRIG 39.0 12/10/2019   HDL 83.40 12/10/2019   LDLDIRECT 162.3 03/21/2012   LDLCALC 93 12/10/2019   ALT 20 12/10/2019   AST 25 12/10/2019   NA 137 12/10/2019   K 3.7 12/10/2019   CL 100 12/10/2019   CREATININE 0.71 12/10/2019   BUN 23 12/10/2019   CO2 29 12/10/2019   TSH 1.28 12/07/2018     ASSESSMENT AND PLAN:  Discussed the following assessment and plan:  Visit for preventive health examination  Medication management - Plan: Lipoprotein A (LPA), VITAMIN D 25 Hydroxy (Vit-D Deficiency, Fractures), Lipid panel, Hepatic function panel, CBC with Differential/Platelet, Basic metabolic panel  Osteoporosis without current pathological fracture, unspecified osteoporosis type - Plan: Lipoprotein A (LPA), VITAMIN D 25 Hydroxy (Vit-D Deficiency, Fractures), Lipid panel, Hepatic function panel, CBC with Differential/Platelet, Basic metabolic panel  Hyperlipidemia, unspecified hyperlipidemia type - Plan: Lipoprotein A (LPA), VITAMIN D 25 Hydroxy (Vit-D Deficiency, Fractures), Lipid panel, Hepatic function panel, CBC with Differential/Platelet, Basic metabolic panel  Disturbance in sleep behavior - ok to refill work  - Plan: Lipoprotein A (LPA), VITAMIN D 25 Hydroxy (Vit-D Deficiency, Fractures), Lipid panel, Hepatic function panel, CBC with Differential/Platelet, Basic metabolic panel  Vitamin D deficiency - Plan: Lipoprotein A (LPA), VITAMIN D 25 Hydroxy (Vit-D Deficiency, Fractures), Lipid panel, Hepatic function panel, CBC with Differential/Platelet, Basic metabolic panel  Swelling of toe of left foot Get a mammogram  Will reorder ambien to use as needed  Weight bearing exercise and  Optimization dexa next year  Plan  Waiting for the  Lab results Ok to take coenzyme q as  asupplement if feels safe    Patient Care Team: Burnis Medin, MD as PCP - General Olga Millers, MD (Obstetrics and Gynecology) Ladene Artist, MD (Gastroenterology) Regal Dpm, Jess Barters, MD (Inactive)  Patient Instructions  Watch the toe area and avoid pressure .  Consider seeing podiatry or other   X ray or  persistent or progressive  Get your mammogram. Glad you are doing well.     Health Maintenance, Female Adopting a healthy lifestyle and getting preventive care are important in  promoting health and wellness. Ask your health care provider about:  The right schedule for  you to have regular tests and exams.  Things you can do on your own to prevent diseases and keep yourself healthy. What should I know about diet, weight, and exercise? Eat a healthy diet   Eat a diet that includes plenty of vegetables, fruits, low-fat dairy products, and lean protein.  Do not eat a lot of foods that are high in solid fats, added sugars, or sodium. Maintain a healthy weight Body mass index (BMI) is used to identify weight problems. It estimates body fat based on height and weight. Your health care provider can help determine your BMI and help you achieve or maintain a healthy weight. Get regular exercise Get regular exercise. This is one of the most important things you can do for your health. Most adults should:  Exercise for at least 150 minutes each week. The exercise should increase your heart rate and make you sweat (moderate-intensity exercise).  Do strengthening exercises at least twice a week. This is in addition to the moderate-intensity exercise.  Spend less time sitting. Even light physical activity can be beneficial. Watch cholesterol and blood lipids Have your blood tested for lipids and cholesterol at 68 years of age, then have this test every 5 years. Have your cholesterol levels checked more often if:  Your lipid or cholesterol levels are high.  You are older than 68 years of age.  You are at high risk for heart disease. What should I know about cancer screening? Depending on your health history and family history, you may need to have cancer screening at various ages. This may include screening for:  Breast cancer.  Cervical cancer.  Colorectal cancer.  Skin cancer.  Lung cancer. What should I know about heart disease, diabetes, and high blood pressure? Blood pressure and heart disease  High blood pressure causes heart disease and increases the  risk of stroke. This is more likely to develop in people who have high blood pressure readings, are of African descent, or are overweight.  Have your blood pressure checked: ? Every 3-5 years if you are 22-24 years of age. ? Every year if you are 74 years old or older. Diabetes Have regular diabetes screenings. This checks your fasting blood sugar level. Have the screening done:  Once every three years after age 16 if you are at a normal weight and have a low risk for diabetes.  More often and at a younger age if you are overweight or have a high risk for diabetes. What should I know about preventing infection? Hepatitis B If you have a higher risk for hepatitis B, you should be screened for this virus. Talk with your health care provider to find out if you are at risk for hepatitis B infection. Hepatitis C Testing is recommended for:  Everyone born from [birth year] through 1964.  Anyone with known risk factors for hepatitis C. Sexually transmitted infections (STIs)  Get screened for STIs, including gonorrhea and chlamydia, if: ? You are sexually active and are younger than 68 years of age. ? You are older than 68 years of age and your health care provider tells you that you are at risk for this type of infection. ? Your sexual activity has changed since you were last screened, and you are at increased risk for chlamydia or gonorrhea. Ask your health care provider if you are at risk.  Ask your health care provider about whether you are at high risk for HIV. Your health care provider may recommend a prescription medicine to  help prevent HIV infection. If you choose to take medicine to prevent HIV, you should first get tested for HIV. You should then be tested every 3 months for as long as you are taking the medicine. Pregnancy  If you are about to stop having your period (premenopausal) and you may become pregnant, seek counseling before you get pregnant.  Take 400 to 800 micrograms (mcg) of  folic acid every day if you become pregnant.  Ask for birth control (contraception) if you want to prevent pregnancy. Osteoporosis and menopause Osteoporosis is a disease in which the bones lose minerals and strength with aging. This can result in bone fractures. If you are 79 years old or older, or if you are at risk for osteoporosis and fractures, ask your health care provider if you should:  Be screened for bone loss.  Take a calcium or vitamin D supplement to lower your risk of fractures.  Be given hormone replacement therapy (HRT) to treat symptoms of menopause. Follow these instructions at home: Lifestyle  Do not use any products that contain nicotine or tobacco, such as cigarettes, e-cigarettes, and chewing tobacco. If you need help quitting, ask your health care provider.  Do not use street drugs.  Do not share needles.  Ask your health care provider for help if you need support or information about quitting drugs. Alcohol use  Do not drink alcohol if: ? Your health care provider tells you not to drink. ? You are pregnant, may be pregnant, or are planning to become pregnant.  If you drink alcohol: ? Limit how much you use to 0-1 drink a day. ? Limit intake if you are breastfeeding.  Be aware of how much alcohol is in your drink. In the [place] one drink equals one 12 oz bottle of beer (355 mL), one 5 oz glass of wine (148 mL), or one 1 oz glass of hard liquor (44 mL). General instructions  Schedule regular health, dental, and eye exams.  Stay current with your vaccines.  Tell your health care provider if: ? You often feel depressed. ? You have ever been abused or do not feel safe at home. Summary  Adopting a healthy lifestyle and getting preventive care are important in promoting health and wellness.  Follow your health care provider's instructions about healthy diet, exercising, and getting tested or screened for diseases.  Follow your health care provider's  instructions on monitoring your cholesterol and blood pressure. This information is not intended to replace advice given to you by your health care provider. Make sure you discuss any questions you have with your health care provider. Document Revised: 05/24/2018 Document Reviewed: 05/24/2018 Elsevier Patient Education  2020 [place] Yamili Lichtenwalner M.D.

## 2019-12-10 NOTE — Addendum Note (Signed)
Addended byBurnis Medin on: 12/10/2019 06:10 PM   Modules accepted: Orders

## 2019-12-10 NOTE — Patient Instructions (Addendum)
Watch the toe area and avoid pressure .  Consider seeing podiatry or other   X ray or  persistent or progressive  Get your mammogram. Glad you are doing well.     Health Maintenance, Female Adopting a healthy lifestyle and getting preventive care are important in promoting health and wellness. Ask your health care provider about:  The right schedule for you to have regular tests and exams.  Things you can do on your own to prevent diseases and keep yourself healthy. What should I know about diet, weight, and exercise? Eat a healthy diet   Eat a diet that includes plenty of vegetables, fruits, low-fat dairy products, and lean protein.  Do not eat a lot of foods that are high in solid fats, added sugars, or sodium. Maintain a healthy weight Body mass index (BMI) is used to identify weight problems. It estimates body fat based on height and weight. Your health care provider can help determine your BMI and help you achieve or maintain a healthy weight. Get regular exercise Get regular exercise. This is one of the most important things you can do for your health. Most adults should:  Exercise for at least 150 minutes each week. The exercise should increase your heart rate and make you sweat (moderate-intensity exercise).  Do strengthening exercises at least twice a week. This is in addition to the moderate-intensity exercise.  Spend less time sitting. Even light physical activity can be beneficial. Watch cholesterol and blood lipids Have your blood tested for lipids and cholesterol at 68 years of age, then have this test every 5 years. Have your cholesterol levels checked more often if:  Your lipid or cholesterol levels are high.  You are older than 68 years of age.  You are at high risk for heart disease. What should I know about cancer screening? Depending on your health history and family history, you may need to have cancer screening at various ages. This may include screening  for:  Breast cancer.  Cervical cancer.  Colorectal cancer.  Skin cancer.  Lung cancer. What should I know about heart disease, diabetes, and high blood pressure? Blood pressure and heart disease  High blood pressure causes heart disease and increases the risk of stroke. This is more likely to develop in people who have high blood pressure readings, are of African descent, or are overweight.  Have your blood pressure checked: ? Every 3-5 years if you are 69-65 years of age. ? Every year if you are 3 years old or older. Diabetes Have regular diabetes screenings. This checks your fasting blood sugar level. Have the screening done:  Once every three years after age 55 if you are at a normal weight and have a low risk for diabetes.  More often and at a younger age if you are overweight or have a high risk for diabetes. What should I know about preventing infection? Hepatitis B If you have a higher risk for hepatitis B, you should be screened for this virus. Talk with your health care provider to find out if you are at risk for hepatitis B infection. Hepatitis C Testing is recommended for:  Everyone born from [birth year] through 1964.  Anyone with known risk factors for hepatitis C. Sexually transmitted infections (STIs)  Get screened for STIs, including gonorrhea and chlamydia, if: ? You are sexually active and are younger than 68 years of age. ? You are older than 68 years of age and your health care provider tells you that  you are at risk for this type of infection. ? Your sexual activity has changed since you were last screened, and you are at increased risk for chlamydia or gonorrhea. Ask your health care provider if you are at risk.  Ask your health care provider about whether you are at high risk for HIV. Your health care provider may recommend a prescription medicine to help prevent HIV infection. If you choose to take medicine to prevent HIV, you should first get tested for HIV.  You should then be tested every 3 months for as long as you are taking the medicine. Pregnancy  If you are about to stop having your period (premenopausal) and you may become pregnant, seek counseling before you get pregnant.  Take 400 to 800 micrograms (mcg) of folic acid every day if you become pregnant.  Ask for birth control (contraception) if you want to prevent pregnancy. Osteoporosis and menopause Osteoporosis is a disease in which the bones lose minerals and strength with aging. This can result in bone fractures. If you are 64 years old or older, or if you are at risk for osteoporosis and fractures, ask your health care provider if you should:  Be screened for bone loss.  Take a calcium or vitamin D supplement to lower your risk of fractures.  Be given hormone replacement therapy (HRT) to treat symptoms of menopause. Follow these instructions at home: Lifestyle  Do not use any products that contain nicotine or tobacco, such as cigarettes, e-cigarettes, and chewing tobacco. If you need help quitting, ask your health care provider.  Do not use street drugs.  Do not share needles.  Ask your health care provider for help if you need support or information about quitting drugs. Alcohol use  Do not drink alcohol if: ? Your health care provider tells you not to drink. ? You are pregnant, may be pregnant, or are planning to become pregnant.  If you drink alcohol: ? Limit how much you use to 0-1 drink a day. ? Limit intake if you are breastfeeding.  Be aware of how much alcohol is in your drink. In the [place] one drink equals one 12 oz bottle of beer (355 mL), one 5 oz glass of wine (148 mL), or one 1 oz glass of hard liquor (44 mL). General instructions  Schedule regular health, dental, and eye exams.  Stay current with your vaccines.  Tell your health care provider if: ? You often feel depressed. ? You have ever been abused or do not feel safe at  home. Summary  Adopting a healthy lifestyle and getting preventive care are important in promoting health and wellness.  Follow your health care provider's instructions about healthy diet, exercising, and getting tested or screened for diseases.  Follow your health care provider's instructions on monitoring your cholesterol and blood pressure. This information is not intended to replace advice given to you by your health care provider. Make sure you discuss any questions you have with your health care provider. Document Revised: 05/24/2018 Document Reviewed: 05/24/2018 Elsevier Patient Education  2020 [employer].

## 2019-12-12 NOTE — Progress Notes (Signed)
Cholesterol is great  !much better   lipoprotein A is pending   All other labs are normal  Continue medication  atorvastatin.  Check yearly

## 2019-12-13 ENCOUNTER — Other Ambulatory Visit: Payer: Self-pay | Admitting: Internal Medicine

## 2019-12-13 ENCOUNTER — Other Ambulatory Visit: Payer: Self-pay

## 2019-12-13 MED ORDER — METRONIDAZOLE 1 % EX GEL: CUTANEOUS | 10 refills | Status: AC

## 2019-12-13 MED ORDER — UREA 40 % EX LOTN: 1.0000 "application " | TOPICAL_LOTION | Freq: Every evening | CUTANEOUS | 3 refills | Status: DC | PRN

## 2019-12-13 NOTE — Telephone Encounter (Signed)
Please see medication alternatives. When I called patient this morning she stated that she needed her refill and I sent and this is what I received.

## 2019-12-13 NOTE — Telephone Encounter (Signed)
Ok to substitute cream for lotion . Ok to send in

## 2019-12-14 ENCOUNTER — Other Ambulatory Visit: Payer: Self-pay

## 2019-12-14 LAB — LIPOPROTEIN A (LPA): Lipoprotein (a): 132 nmol/L — ABNORMAL HIGH (ref <75)

## 2019-12-14 MED ORDER — ATORVASTATIN CALCIUM 20 MG PO TABS
20.0000 mg | ORAL_TABLET | Freq: Every day | ORAL | 3 refills | Status: DC
Start: 2019-12-14 — End: 2021-02-10

## 2019-12-14 NOTE — Progress Notes (Signed)
Lipoprotein a is up  .  So even though your lipid profile is good  you have the option to increase the atorvastatin to 20 mg per day .    If  you want to try  please send in atorvastatin 20 mg per day disp 90 refill x 3

## 2019-12-26 ENCOUNTER — Other Ambulatory Visit: Payer: Self-pay

## 2019-12-26 ENCOUNTER — Other Ambulatory Visit: Payer: Self-pay | Admitting: Internal Medicine

## 2019-12-26 MED ORDER — UREA 40 % EX LOTN: TOPICAL_LOTION | CUTANEOUS | 3 refills | Status: DC

## 2019-12-26 NOTE — Telephone Encounter (Signed)
Ok to send in the 12% cream  and tell patient  That the 40% was not available   Refills ok prn

## 2019-12-26 NOTE — Telephone Encounter (Signed)
Please see message.  Please advise. 

## 2020-01-03 DIAGNOSIS — Z1231 Encounter for screening mammogram for malignant neoplasm of breast: Secondary | ICD-10-CM | POA: Diagnosis not present

## 2020-01-03 LAB — HM MAMMOGRAPHY

## 2020-01-21 ENCOUNTER — Other Ambulatory Visit: Payer: Self-pay

## 2020-01-21 ENCOUNTER — Ambulatory Visit: Payer: Medicare PPO | Admitting: Podiatry

## 2020-01-21 DIAGNOSIS — M79672 Pain in left foot: Secondary | ICD-10-CM

## 2020-01-21 DIAGNOSIS — M2042 Other hammer toe(s) (acquired), left foot: Secondary | ICD-10-CM

## 2020-01-21 DIAGNOSIS — Q828 Other specified congenital malformations of skin: Secondary | ICD-10-CM | POA: Diagnosis not present

## 2020-01-21 DIAGNOSIS — L84 Corns and callosities: Secondary | ICD-10-CM | POA: Diagnosis not present

## 2020-01-21 MED ORDER — UREA 40 % EX CREA: TOPICAL_CREAM | CUTANEOUS | 3 refills | Status: DC

## 2020-01-21 NOTE — Progress Notes (Signed)
  Subjective:  Patient ID: [name], female    DOB: 05-Nov-1950,  MRN: [id number]  Chief Complaint  Patient presents with  . Callouses    Corns - L 5th toe (lateral) and interdigital 4-5. Pt stated, "They can be painful. I had an infection between the toes in the past, but it's not infected now. No drainage or swelling".  . Foreign Body    L plantar forefoot/midfoot submet 5. Pt stated, "We broke some glass while we were moving a few months ago. Around that time, I noticed a sharp pain in my foot. My husband didn't see any glass in my foot. It still bothers me when I walk barefoot, or if I turn my foot a certain way".    69 y.o. female presents with the above complaint. History confirmed with patient.   Objective:  Physical Exam: warm, good capillary refill, no trophic changes or ulcerative lesions, normal DP and PT pulses and normal sensory exam. Left Foot: semi-flexible hammertoe of 4th toe and adductovarus contracture of 5th toe with heloma molle medial 5th toe, dorsolateral PIPJ 5th toe, sub-met 5 porokeratosis, no sign of puncture wound or FB  Assessment:   1. Hammertoe of left foot   2. Heloma molle   3. Porokeratosis   4. Callus of foot   5. Pain in left foot      Plan:  Patient was evaluated and treated and all questions answered.   All symptomatic hyperkeratoses were safely debrided with a sterile #15 blade to patient's level of comfort without incident. We discussed preventative and palliative care of these lesions including supportive and accommodative shoegear, padding, prefabricated and custom molded accommodative orthoses, use of a pumice stone and lotions/creams daily. Rx for urea cream 40% sent, she will see if this is similar to a brand she has previously used and will pay OOP if necessary  For the hammertoe contractures causing the heloma molle and dorsal corn formation, we discussed non surgical and surgical treatment. Surgically the following  procedures would be beneficial: arthoplasty of the 5th PIPJ and 4th toe hemi-phalangectomy +/- PIPJ arthrodesis. Non-surgically we discussed using offloading and silicone pads to prevent recurrence / worsening. Dispensed silicone toe tubes and corn pads and she will try these. She is keen to avoid surgery which I understand and we will consider as a last resort.   Return if symptoms worsen or fail to improve.

## 2020-01-25 ENCOUNTER — Ambulatory Visit: Payer: Medicare PPO | Admitting: Podiatry

## 2020-03-11 ENCOUNTER — Telehealth: Payer: Self-pay | Admitting: Internal Medicine

## 2020-03-11 NOTE — Telephone Encounter (Signed)
Left message for patient to call back and schedule Medicare Annual Wellness Visit (AWV) either virtually or in office.  NO HX; please schedule at anytime with [facility] Nurse Health Advisor 2.  This should be a 45 minute visit. 

## 2020-03-26 DIAGNOSIS — L905 Scar conditions and fibrosis of skin: Secondary | ICD-10-CM | POA: Diagnosis not present

## 2020-03-26 DIAGNOSIS — D225 Melanocytic nevi of trunk: Secondary | ICD-10-CM | POA: Diagnosis not present

## 2020-03-26 DIAGNOSIS — Z85828 Personal history of other malignant neoplasm of skin: Secondary | ICD-10-CM | POA: Diagnosis not present

## 2020-03-26 DIAGNOSIS — L821 Other seborrheic keratosis: Secondary | ICD-10-CM | POA: Diagnosis not present

## 2020-03-26 DIAGNOSIS — L718 Other rosacea: Secondary | ICD-10-CM | POA: Diagnosis not present

## 2020-03-26 DIAGNOSIS — L814 Other melanin hyperpigmentation: Secondary | ICD-10-CM | POA: Diagnosis not present

## 2020-03-26 DIAGNOSIS — D1801 Hemangioma of skin and subcutaneous tissue: Secondary | ICD-10-CM | POA: Diagnosis not present

## 2020-04-08 NOTE — Telephone Encounter (Signed)
Pt would like to have her lipids rechecked when she comes in for her AWV.   Okay to enter labs or do you want patient to wait til she comes in and see you? Please advise.

## 2020-04-08 NOTE — Telephone Encounter (Signed)
Left message for patient to call back and schedule Medicare Annual Wellness Visit (AWV) either virtually or in office.  WELCOME EXAM 04/2017 ; please schedule at anytime with [facility] Nurse Health Advisor 2.  This should be a 45 minute visit.

## 2020-04-08 NOTE — Telephone Encounter (Signed)
Pt would like to have labs drawn after being on her new cholesterol medicine to see if there is any change in results.   Please advise

## 2020-04-11 NOTE — Telephone Encounter (Signed)
Ok to get lipid panel please arrange if she wishes (Not helpful to repeat the lipoprotein a level )

## 2020-04-15 ENCOUNTER — Other Ambulatory Visit: Payer: Self-pay

## 2020-04-15 DIAGNOSIS — E785 Hyperlipidemia, unspecified: Secondary | ICD-10-CM

## 2020-05-02 ENCOUNTER — Ambulatory Visit: Payer: Medicare PPO | Admitting: Podiatry

## 2020-05-02 ENCOUNTER — Other Ambulatory Visit: Payer: Self-pay

## 2020-05-02 ENCOUNTER — Other Ambulatory Visit: Payer: Medicare PPO

## 2020-05-02 ENCOUNTER — Other Ambulatory Visit (INDEPENDENT_AMBULATORY_CARE_PROVIDER_SITE_OTHER): Payer: Medicare PPO

## 2020-05-02 ENCOUNTER — Ambulatory Visit (INDEPENDENT_AMBULATORY_CARE_PROVIDER_SITE_OTHER): Payer: Medicare PPO

## 2020-05-02 DIAGNOSIS — M778 Other enthesopathies, not elsewhere classified: Secondary | ICD-10-CM | POA: Diagnosis not present

## 2020-05-02 DIAGNOSIS — L84 Corns and callosities: Secondary | ICD-10-CM | POA: Diagnosis not present

## 2020-05-02 DIAGNOSIS — E785 Hyperlipidemia, unspecified: Secondary | ICD-10-CM

## 2020-05-02 DIAGNOSIS — Q828 Other specified congenital malformations of skin: Secondary | ICD-10-CM | POA: Diagnosis not present

## 2020-05-02 DIAGNOSIS — M2042 Other hammer toe(s) (acquired), left foot: Secondary | ICD-10-CM

## 2020-05-02 DIAGNOSIS — M7751 Other enthesopathy of right foot: Secondary | ICD-10-CM | POA: Diagnosis not present

## 2020-05-02 LAB — LIPID PANEL
Cholesterol: 152 mg/dL (ref 0–200)
HDL: 70.7 mg/dL (ref >39.00)
LDL Cholesterol: 65 mg/dL (ref 0–99)
NonHDL: 81.39
Total CHOL/HDL Ratio: 2
Triglycerides: 82 mg/dL (ref 0.0–149.0)
VLDL: 16.4 mg/dL (ref 0.0–40.0)

## 2020-05-02 NOTE — Addendum Note (Signed)
Addended by: Marrion Coy on: 05/02/2020 08:10 AM   Modules accepted: Orders

## 2020-05-05 ENCOUNTER — Encounter: Payer: Self-pay | Admitting: Podiatry

## 2020-05-05 NOTE — Progress Notes (Signed)
  Subjective:  Patient ID: [name], female    DOB: May 26, 1951,  MRN: [id number]  Chief Complaint  Patient presents with  . Foot Pain    )right foot-side, very sore---pt feels pin is coming out    69 y.o. female presents with the above complaint. History confirmed with patient.  She has a history of surgery on his right foot by Dr. Paulla Dolly many years ago in 2008.  She thinks the screw that is in there might be coming out.  It is very painful and red and sore  Objective:  Physical Exam: warm, good capillary refill, no trophic changes or ulcerative lesions, normal DP and PT pulses and normal sensory exam. Left Foot: semi-flexible hammertoe of 4th toe and adductovarus contracture of 5th toe with heloma molle medial 5th toe, dorsolateral PIPJ 5th toe, sub-met 5 porokeratosis Right foot there is an erythematous fluctuant area may be consistent with a bursa over the lateral fifth metatarsal, there is a hyperkeratosis over this as well  Assessment:   1. Capsulitis of foot, right   2. Bursitis of right foot   3. Callus of foot   4. Hammertoe of left foot   5. Heloma molle   6. Porokeratosis      Plan:  Patient was evaluated and treated and all questions answered.  We will continue padding and offloading for the left foot and nonsurgical treatment.  Discussed with her that the right foot is tender likely secondary to bursitis, this could be irritated both from shoe gear as well as the underlying orthopedic implant.  I debrided the callus over this and a small amount of serous fluid drained from the lesion.  There is no signs of cellulitis or purulence or infection.  Appears to be a serous blister.  She had deep pain overlying bursa over the fifth metatarsal and I recommended a steroid injection for this.  Following sterile prep with alcohol, 4 mg of dexamethasone and 0.5 cc of lidocaine 2% was injected into the bursa.  She tolerated this well.  An offloading pad was  dispensed.  Return in about 2 weeks (around 05/16/2020).

## 2020-05-12 NOTE — Progress Notes (Signed)
As you can see   lipid level is much better

## 2020-07-28 DIAGNOSIS — N39490 Overflow incontinence: Secondary | ICD-10-CM | POA: Diagnosis not present

## 2020-07-28 DIAGNOSIS — S12490A Other displaced fracture of fifth cervical vertebra, initial encounter for closed fracture: Secondary | ICD-10-CM | POA: Diagnosis not present

## 2020-07-28 DIAGNOSIS — G8250 Quadriplegia, unspecified: Secondary | ICD-10-CM | POA: Diagnosis not present

## 2020-07-28 DIAGNOSIS — Z9189 Other specified personal risk factors, not elsewhere classified: Secondary | ICD-10-CM | POA: Diagnosis not present

## 2020-07-28 DIAGNOSIS — S12400A Unspecified displaced fracture of fifth cervical vertebra, initial encounter for closed fracture: Secondary | ICD-10-CM | POA: Diagnosis not present

## 2020-07-28 DIAGNOSIS — I6523 Occlusion and stenosis of bilateral carotid arteries: Secondary | ICD-10-CM | POA: Diagnosis not present

## 2020-07-28 DIAGNOSIS — R001 Bradycardia, unspecified: Secondary | ICD-10-CM | POA: Diagnosis not present

## 2020-07-28 DIAGNOSIS — M47812 Spondylosis without myelopathy or radiculopathy, cervical region: Secondary | ICD-10-CM | POA: Diagnosis not present

## 2020-07-28 DIAGNOSIS — N319 Neuromuscular dysfunction of bladder, unspecified: Secondary | ICD-10-CM | POA: Diagnosis not present

## 2020-07-28 DIAGNOSIS — R059 Cough, unspecified: Secondary | ICD-10-CM | POA: Diagnosis not present

## 2020-07-28 DIAGNOSIS — M4802 Spinal stenosis, cervical region: Secondary | ICD-10-CM | POA: Diagnosis not present

## 2020-07-28 DIAGNOSIS — M25511 Pain in right shoulder: Secondary | ICD-10-CM | POA: Diagnosis not present

## 2020-07-28 DIAGNOSIS — S2242XA Multiple fractures of ribs, left side, initial encounter for closed fracture: Secondary | ICD-10-CM | POA: Diagnosis not present

## 2020-07-28 DIAGNOSIS — S13160A Subluxation of C5/C6 cervical vertebrae, initial encounter: Secondary | ICD-10-CM | POA: Diagnosis not present

## 2020-07-28 DIAGNOSIS — Z7409 Other reduced mobility: Secondary | ICD-10-CM | POA: Diagnosis not present

## 2020-07-28 DIAGNOSIS — G8911 Acute pain due to trauma: Secondary | ICD-10-CM | POA: Diagnosis not present

## 2020-07-28 DIAGNOSIS — G4700 Insomnia, unspecified: Secondary | ICD-10-CM | POA: Diagnosis not present

## 2020-07-28 DIAGNOSIS — S14105A Unspecified injury at C5 level of cervical spinal cord, initial encounter: Secondary | ICD-10-CM | POA: Diagnosis not present

## 2020-07-28 DIAGNOSIS — I498 Other specified cardiac arrhythmias: Secondary | ICD-10-CM | POA: Diagnosis not present

## 2020-07-28 DIAGNOSIS — N390 Urinary tract infection, site not specified: Secondary | ICD-10-CM | POA: Diagnosis not present

## 2020-07-28 DIAGNOSIS — G8918 Other acute postprocedural pain: Secondary | ICD-10-CM | POA: Diagnosis not present

## 2020-07-28 DIAGNOSIS — R1310 Dysphagia, unspecified: Secondary | ICD-10-CM | POA: Diagnosis not present

## 2020-07-28 DIAGNOSIS — S12450A Other traumatic displaced spondylolisthesis of fifth cervical vertebra, initial encounter for closed fracture: Secondary | ICD-10-CM | POA: Diagnosis not present

## 2020-07-28 DIAGNOSIS — E785 Hyperlipidemia, unspecified: Secondary | ICD-10-CM | POA: Diagnosis not present

## 2020-07-28 DIAGNOSIS — K592 Neurogenic bowel, not elsewhere classified: Secondary | ICD-10-CM | POA: Diagnosis not present

## 2020-07-28 DIAGNOSIS — J9589 Other postprocedural complications and disorders of respiratory system, not elsewhere classified: Secondary | ICD-10-CM | POA: Diagnosis not present

## 2020-07-28 DIAGNOSIS — M25512 Pain in left shoulder: Secondary | ICD-10-CM | POA: Diagnosis not present

## 2020-07-28 DIAGNOSIS — S2221XA Fracture of manubrium, initial encounter for closed fracture: Secondary | ICD-10-CM | POA: Diagnosis not present

## 2020-07-28 DIAGNOSIS — S14106A Unspecified injury at C6 level of cervical spinal cord, initial encounter: Secondary | ICD-10-CM | POA: Diagnosis not present

## 2020-07-28 DIAGNOSIS — J90 Pleural effusion, not elsewhere classified: Secondary | ICD-10-CM | POA: Diagnosis not present

## 2020-07-28 DIAGNOSIS — M62838 Other muscle spasm: Secondary | ICD-10-CM | POA: Diagnosis not present

## 2020-07-28 DIAGNOSIS — M792 Neuralgia and neuritis, unspecified: Secondary | ICD-10-CM | POA: Diagnosis not present

## 2020-07-28 DIAGNOSIS — S12500A Unspecified displaced fracture of sixth cervical vertebra, initial encounter for closed fracture: Secondary | ICD-10-CM | POA: Diagnosis not present

## 2020-07-28 DIAGNOSIS — T1490XA Injury, unspecified, initial encounter: Secondary | ICD-10-CM | POA: Diagnosis not present

## 2020-07-28 DIAGNOSIS — S270XXA Traumatic pneumothorax, initial encounter: Secondary | ICD-10-CM | POA: Diagnosis not present

## 2020-07-28 DIAGNOSIS — Z7189 Other specified counseling: Secondary | ICD-10-CM | POA: Diagnosis not present

## 2020-07-28 DIAGNOSIS — S27329A Contusion of lung, unspecified, initial encounter: Secondary | ICD-10-CM | POA: Diagnosis not present

## 2020-07-28 DIAGNOSIS — Z79899 Other long term (current) drug therapy: Secondary | ICD-10-CM | POA: Diagnosis not present

## 2020-07-28 DIAGNOSIS — I6502 Occlusion and stenosis of left vertebral artery: Secondary | ICD-10-CM | POA: Diagnosis not present

## 2020-08-11 DIAGNOSIS — I6502 Occlusion and stenosis of left vertebral artery: Secondary | ICD-10-CM | POA: Diagnosis not present

## 2020-08-11 DIAGNOSIS — R29898 Other symptoms and signs involving the musculoskeletal system: Secondary | ICD-10-CM | POA: Diagnosis not present

## 2020-08-11 DIAGNOSIS — S12400A Unspecified displaced fracture of fifth cervical vertebra, initial encounter for closed fracture: Secondary | ICD-10-CM | POA: Diagnosis not present

## 2020-08-11 DIAGNOSIS — R001 Bradycardia, unspecified: Secondary | ICD-10-CM | POA: Diagnosis not present

## 2020-08-11 DIAGNOSIS — F4321 Adjustment disorder with depressed mood: Secondary | ICD-10-CM | POA: Diagnosis not present

## 2020-08-11 DIAGNOSIS — K592 Neurogenic bowel, not elsewhere classified: Secondary | ICD-10-CM | POA: Diagnosis not present

## 2020-08-11 DIAGNOSIS — S14106A Unspecified injury at C6 level of cervical spinal cord, initial encounter: Secondary | ICD-10-CM | POA: Diagnosis not present

## 2020-08-11 DIAGNOSIS — S2242XD Multiple fractures of ribs, left side, subsequent encounter for fracture with routine healing: Secondary | ICD-10-CM | POA: Diagnosis not present

## 2020-08-11 DIAGNOSIS — G8254 Quadriplegia, C5-C7 incomplete: Secondary | ICD-10-CM | POA: Diagnosis not present

## 2020-08-11 DIAGNOSIS — M7989 Other specified soft tissue disorders: Secondary | ICD-10-CM | POA: Diagnosis not present

## 2020-08-11 DIAGNOSIS — I959 Hypotension, unspecified: Secondary | ICD-10-CM | POA: Diagnosis not present

## 2020-08-11 DIAGNOSIS — N3080 Other cystitis without hematuria: Secondary | ICD-10-CM | POA: Diagnosis not present

## 2020-08-11 DIAGNOSIS — S14105A Unspecified injury at C5 level of cervical spinal cord, initial encounter: Secondary | ICD-10-CM | POA: Diagnosis not present

## 2020-08-11 DIAGNOSIS — S12500A Unspecified displaced fracture of sixth cervical vertebra, initial encounter for closed fracture: Secondary | ICD-10-CM | POA: Diagnosis not present

## 2020-08-11 DIAGNOSIS — M79604 Pain in right leg: Secondary | ICD-10-CM | POA: Diagnosis not present

## 2020-08-11 DIAGNOSIS — Z981 Arthrodesis status: Secondary | ICD-10-CM | POA: Diagnosis not present

## 2020-08-11 DIAGNOSIS — I82612 Acute embolism and thrombosis of superficial veins of left upper extremity: Secondary | ICD-10-CM | POA: Diagnosis not present

## 2020-08-11 DIAGNOSIS — R093 Abnormal sputum: Secondary | ICD-10-CM | POA: Diagnosis not present

## 2020-08-11 DIAGNOSIS — G904 Autonomic dysreflexia: Secondary | ICD-10-CM | POA: Diagnosis not present

## 2020-08-11 DIAGNOSIS — G8250 Quadriplegia, unspecified: Secondary | ICD-10-CM | POA: Diagnosis not present

## 2020-08-11 DIAGNOSIS — S12400D Unspecified displaced fracture of fifth cervical vertebra, subsequent encounter for fracture with routine healing: Secondary | ICD-10-CM | POA: Diagnosis not present

## 2020-08-11 DIAGNOSIS — R252 Cramp and spasm: Secondary | ICD-10-CM | POA: Diagnosis not present

## 2020-08-11 DIAGNOSIS — S270XXA Traumatic pneumothorax, initial encounter: Secondary | ICD-10-CM | POA: Diagnosis not present

## 2020-08-11 DIAGNOSIS — G890 Central pain syndrome: Secondary | ICD-10-CM | POA: Diagnosis not present

## 2020-08-11 DIAGNOSIS — N3090 Cystitis, unspecified without hematuria: Secondary | ICD-10-CM | POA: Diagnosis not present

## 2020-08-11 DIAGNOSIS — G8911 Acute pain due to trauma: Secondary | ICD-10-CM | POA: Diagnosis not present

## 2020-08-11 DIAGNOSIS — G903 Multi-system degeneration of the autonomic nervous system: Secondary | ICD-10-CM | POA: Diagnosis not present

## 2020-08-11 DIAGNOSIS — S129XXD Fracture of neck, unspecified, subsequent encounter: Secondary | ICD-10-CM | POA: Diagnosis not present

## 2020-08-11 DIAGNOSIS — Z7982 Long term (current) use of aspirin: Secondary | ICD-10-CM | POA: Diagnosis not present

## 2020-08-11 DIAGNOSIS — S14155D Other incomplete lesion at C5 level of cervical spinal cord, subsequent encounter: Secondary | ICD-10-CM | POA: Diagnosis not present

## 2020-08-11 DIAGNOSIS — S2221XD Fracture of manubrium, subsequent encounter for fracture with routine healing: Secondary | ICD-10-CM | POA: Diagnosis not present

## 2020-08-11 DIAGNOSIS — N319 Neuromuscular dysfunction of bladder, unspecified: Secondary | ICD-10-CM | POA: Diagnosis not present

## 2020-08-11 DIAGNOSIS — Z743 Need for continuous supervision: Secondary | ICD-10-CM | POA: Diagnosis not present

## 2020-08-11 DIAGNOSIS — M5030 Other cervical disc degeneration, unspecified cervical region: Secondary | ICD-10-CM | POA: Diagnosis not present

## 2020-08-11 DIAGNOSIS — S27321D Contusion of lung, unilateral, subsequent encounter: Secondary | ICD-10-CM | POA: Diagnosis not present

## 2020-08-11 DIAGNOSIS — M47812 Spondylosis without myelopathy or radiculopathy, cervical region: Secondary | ICD-10-CM | POA: Diagnosis not present

## 2020-08-11 DIAGNOSIS — G4700 Insomnia, unspecified: Secondary | ICD-10-CM | POA: Diagnosis not present

## 2020-08-11 DIAGNOSIS — J984 Other disorders of lung: Secondary | ICD-10-CM | POA: Diagnosis not present

## 2020-08-11 DIAGNOSIS — I898 Other specified noninfective disorders of lymphatic vessels and lymph nodes: Secondary | ICD-10-CM | POA: Diagnosis not present

## 2020-08-11 DIAGNOSIS — S140XXD Concussion and edema of cervical spinal cord, subsequent encounter: Secondary | ICD-10-CM | POA: Diagnosis not present

## 2020-08-11 DIAGNOSIS — D62 Acute posthemorrhagic anemia: Secondary | ICD-10-CM | POA: Diagnosis not present

## 2020-08-11 DIAGNOSIS — D6869 Other thrombophilia: Secondary | ICD-10-CM | POA: Diagnosis not present

## 2020-08-11 DIAGNOSIS — I951 Orthostatic hypotension: Secondary | ICD-10-CM | POA: Diagnosis not present

## 2020-08-11 DIAGNOSIS — G8918 Other acute postprocedural pain: Secondary | ICD-10-CM | POA: Diagnosis not present

## 2020-08-11 DIAGNOSIS — N3000 Acute cystitis without hematuria: Secondary | ICD-10-CM | POA: Diagnosis not present

## 2020-09-29 ENCOUNTER — Telehealth: Payer: Self-pay | Admitting: Internal Medicine

## 2020-09-29 DIAGNOSIS — Z960 Presence of urogenital implants: Secondary | ICD-10-CM

## 2020-09-29 DIAGNOSIS — N319 Neuromuscular dysfunction of bladder, unspecified: Secondary | ICD-10-CM

## 2020-09-29 NOTE — Telephone Encounter (Signed)
Pt need a referral to Urologist will explain why when the nurse call him back ( per patient husband please call [phone]
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
## 2020-10-01 NOTE — Telephone Encounter (Signed)
Pt is calling back and wanted to see when would the referral be put in to see an urology and is requesting a call back, please advise. CB is [phone]

## 2020-10-01 NOTE — Telephone Encounter (Signed)
Referral to urology has been placed on 04/20.

## 2020-10-01 NOTE — Telephone Encounter (Signed)
Patients husband informed of the name and number of the practice that the patient has been referred to.

## 2020-10-16 DIAGNOSIS — R29898 Other symptoms and signs involving the musculoskeletal system: Secondary | ICD-10-CM | POA: Diagnosis not present

## 2020-10-16 DIAGNOSIS — R262 Difficulty in walking, not elsewhere classified: Secondary | ICD-10-CM | POA: Diagnosis not present

## 2020-10-16 DIAGNOSIS — G8254 Quadriplegia, C5-C7 incomplete: Secondary | ICD-10-CM | POA: Diagnosis not present

## 2020-10-16 DIAGNOSIS — M2560 Stiffness of unspecified joint, not elsewhere classified: Secondary | ICD-10-CM | POA: Diagnosis not present

## 2020-10-16 DIAGNOSIS — R338 Other retention of urine: Secondary | ICD-10-CM | POA: Diagnosis not present

## 2020-10-16 DIAGNOSIS — K592 Neurogenic bowel, not elsewhere classified: Secondary | ICD-10-CM | POA: Diagnosis not present

## 2020-10-16 DIAGNOSIS — N319 Neuromuscular dysfunction of bladder, unspecified: Secondary | ICD-10-CM | POA: Diagnosis not present

## 2020-10-16 DIAGNOSIS — R2991 Unspecified symptoms and signs involving the musculoskeletal system: Secondary | ICD-10-CM | POA: Diagnosis not present

## 2020-10-16 DIAGNOSIS — M6250 Muscle wasting and atrophy, not elsewhere classified, unspecified site: Secondary | ICD-10-CM | POA: Diagnosis not present

## 2020-10-16 DIAGNOSIS — R29818 Other symptoms and signs involving the nervous system: Secondary | ICD-10-CM | POA: Diagnosis not present

## 2020-10-17 DIAGNOSIS — R2991 Unspecified symptoms and signs involving the musculoskeletal system: Secondary | ICD-10-CM | POA: Diagnosis not present

## 2020-10-17 DIAGNOSIS — K592 Neurogenic bowel, not elsewhere classified: Secondary | ICD-10-CM | POA: Diagnosis not present

## 2020-10-17 DIAGNOSIS — G8250 Quadriplegia, unspecified: Secondary | ICD-10-CM | POA: Diagnosis not present

## 2020-10-17 DIAGNOSIS — G8254 Quadriplegia, C5-C7 incomplete: Secondary | ICD-10-CM | POA: Diagnosis not present

## 2020-10-17 DIAGNOSIS — M6250 Muscle wasting and atrophy, not elsewhere classified, unspecified site: Secondary | ICD-10-CM | POA: Diagnosis not present

## 2020-10-17 DIAGNOSIS — N319 Neuromuscular dysfunction of bladder, unspecified: Secondary | ICD-10-CM | POA: Diagnosis not present

## 2020-10-17 DIAGNOSIS — M2560 Stiffness of unspecified joint, not elsewhere classified: Secondary | ICD-10-CM | POA: Diagnosis not present

## 2020-10-17 DIAGNOSIS — R29818 Other symptoms and signs involving the nervous system: Secondary | ICD-10-CM | POA: Diagnosis not present

## 2020-10-17 DIAGNOSIS — F439 Reaction to severe stress, unspecified: Secondary | ICD-10-CM | POA: Diagnosis not present

## 2020-10-17 DIAGNOSIS — R29898 Other symptoms and signs involving the musculoskeletal system: Secondary | ICD-10-CM | POA: Diagnosis not present

## 2020-10-17 DIAGNOSIS — R262 Difficulty in walking, not elsewhere classified: Secondary | ICD-10-CM | POA: Diagnosis not present

## 2020-10-20 DIAGNOSIS — M6250 Muscle wasting and atrophy, not elsewhere classified, unspecified site: Secondary | ICD-10-CM | POA: Diagnosis not present

## 2020-10-20 DIAGNOSIS — G8254 Quadriplegia, C5-C7 incomplete: Secondary | ICD-10-CM | POA: Diagnosis not present

## 2020-10-20 DIAGNOSIS — M2560 Stiffness of unspecified joint, not elsewhere classified: Secondary | ICD-10-CM | POA: Diagnosis not present

## 2020-10-20 DIAGNOSIS — R2991 Unspecified symptoms and signs involving the musculoskeletal system: Secondary | ICD-10-CM | POA: Diagnosis not present

## 2020-10-20 DIAGNOSIS — N319 Neuromuscular dysfunction of bladder, unspecified: Secondary | ICD-10-CM | POA: Diagnosis not present

## 2020-10-20 DIAGNOSIS — R262 Difficulty in walking, not elsewhere classified: Secondary | ICD-10-CM | POA: Diagnosis not present

## 2020-10-20 DIAGNOSIS — R29818 Other symptoms and signs involving the nervous system: Secondary | ICD-10-CM | POA: Diagnosis not present

## 2020-10-20 DIAGNOSIS — R29898 Other symptoms and signs involving the musculoskeletal system: Secondary | ICD-10-CM | POA: Diagnosis not present

## 2020-10-20 DIAGNOSIS — K592 Neurogenic bowel, not elsewhere classified: Secondary | ICD-10-CM | POA: Diagnosis not present

## 2020-10-21 DIAGNOSIS — N319 Neuromuscular dysfunction of bladder, unspecified: Secondary | ICD-10-CM | POA: Diagnosis not present

## 2020-10-21 DIAGNOSIS — G8254 Quadriplegia, C5-C7 incomplete: Secondary | ICD-10-CM | POA: Diagnosis not present

## 2020-10-21 DIAGNOSIS — R29818 Other symptoms and signs involving the nervous system: Secondary | ICD-10-CM | POA: Diagnosis not present

## 2020-10-21 DIAGNOSIS — K592 Neurogenic bowel, not elsewhere classified: Secondary | ICD-10-CM | POA: Diagnosis not present

## 2020-10-21 DIAGNOSIS — R2991 Unspecified symptoms and signs involving the musculoskeletal system: Secondary | ICD-10-CM | POA: Diagnosis not present

## 2020-10-21 DIAGNOSIS — M6250 Muscle wasting and atrophy, not elsewhere classified, unspecified site: Secondary | ICD-10-CM | POA: Diagnosis not present

## 2020-10-21 DIAGNOSIS — R262 Difficulty in walking, not elsewhere classified: Secondary | ICD-10-CM | POA: Diagnosis not present

## 2020-10-21 DIAGNOSIS — M2560 Stiffness of unspecified joint, not elsewhere classified: Secondary | ICD-10-CM | POA: Diagnosis not present

## 2020-10-21 DIAGNOSIS — R29898 Other symptoms and signs involving the musculoskeletal system: Secondary | ICD-10-CM | POA: Diagnosis not present

## 2020-10-22 DIAGNOSIS — R262 Difficulty in walking, not elsewhere classified: Secondary | ICD-10-CM | POA: Diagnosis not present

## 2020-10-22 DIAGNOSIS — N319 Neuromuscular dysfunction of bladder, unspecified: Secondary | ICD-10-CM | POA: Diagnosis not present

## 2020-10-22 DIAGNOSIS — M2560 Stiffness of unspecified joint, not elsewhere classified: Secondary | ICD-10-CM | POA: Diagnosis not present

## 2020-10-22 DIAGNOSIS — G8254 Quadriplegia, C5-C7 incomplete: Secondary | ICD-10-CM | POA: Diagnosis not present

## 2020-10-22 DIAGNOSIS — R29898 Other symptoms and signs involving the musculoskeletal system: Secondary | ICD-10-CM | POA: Diagnosis not present

## 2020-10-22 DIAGNOSIS — K592 Neurogenic bowel, not elsewhere classified: Secondary | ICD-10-CM | POA: Diagnosis not present

## 2020-10-22 DIAGNOSIS — R29818 Other symptoms and signs involving the nervous system: Secondary | ICD-10-CM | POA: Diagnosis not present

## 2020-10-22 DIAGNOSIS — M6250 Muscle wasting and atrophy, not elsewhere classified, unspecified site: Secondary | ICD-10-CM | POA: Diagnosis not present

## 2020-10-22 DIAGNOSIS — R2991 Unspecified symptoms and signs involving the musculoskeletal system: Secondary | ICD-10-CM | POA: Diagnosis not present

## 2020-10-23 DIAGNOSIS — N319 Neuromuscular dysfunction of bladder, unspecified: Secondary | ICD-10-CM | POA: Diagnosis not present

## 2020-10-23 DIAGNOSIS — R29898 Other symptoms and signs involving the musculoskeletal system: Secondary | ICD-10-CM | POA: Diagnosis not present

## 2020-10-23 DIAGNOSIS — R2991 Unspecified symptoms and signs involving the musculoskeletal system: Secondary | ICD-10-CM | POA: Diagnosis not present

## 2020-10-23 DIAGNOSIS — G8254 Quadriplegia, C5-C7 incomplete: Secondary | ICD-10-CM | POA: Diagnosis not present

## 2020-10-23 DIAGNOSIS — R29818 Other symptoms and signs involving the nervous system: Secondary | ICD-10-CM | POA: Diagnosis not present

## 2020-10-23 DIAGNOSIS — R262 Difficulty in walking, not elsewhere classified: Secondary | ICD-10-CM | POA: Diagnosis not present

## 2020-10-23 DIAGNOSIS — K592 Neurogenic bowel, not elsewhere classified: Secondary | ICD-10-CM | POA: Diagnosis not present

## 2020-10-23 DIAGNOSIS — M6250 Muscle wasting and atrophy, not elsewhere classified, unspecified site: Secondary | ICD-10-CM | POA: Diagnosis not present

## 2020-10-23 DIAGNOSIS — M2560 Stiffness of unspecified joint, not elsewhere classified: Secondary | ICD-10-CM | POA: Diagnosis not present

## 2020-10-24 DIAGNOSIS — R29898 Other symptoms and signs involving the musculoskeletal system: Secondary | ICD-10-CM | POA: Diagnosis not present

## 2020-10-24 DIAGNOSIS — R2991 Unspecified symptoms and signs involving the musculoskeletal system: Secondary | ICD-10-CM | POA: Diagnosis not present

## 2020-10-24 DIAGNOSIS — F439 Reaction to severe stress, unspecified: Secondary | ICD-10-CM | POA: Diagnosis not present

## 2020-10-24 DIAGNOSIS — K592 Neurogenic bowel, not elsewhere classified: Secondary | ICD-10-CM | POA: Diagnosis not present

## 2020-10-24 DIAGNOSIS — G8254 Quadriplegia, C5-C7 incomplete: Secondary | ICD-10-CM | POA: Diagnosis not present

## 2020-10-24 DIAGNOSIS — M6250 Muscle wasting and atrophy, not elsewhere classified, unspecified site: Secondary | ICD-10-CM | POA: Diagnosis not present

## 2020-10-24 DIAGNOSIS — G8250 Quadriplegia, unspecified: Secondary | ICD-10-CM | POA: Diagnosis not present

## 2020-10-24 DIAGNOSIS — R262 Difficulty in walking, not elsewhere classified: Secondary | ICD-10-CM | POA: Diagnosis not present

## 2020-10-24 DIAGNOSIS — N319 Neuromuscular dysfunction of bladder, unspecified: Secondary | ICD-10-CM | POA: Diagnosis not present

## 2020-10-24 DIAGNOSIS — R29818 Other symptoms and signs involving the nervous system: Secondary | ICD-10-CM | POA: Diagnosis not present

## 2020-10-24 DIAGNOSIS — M2560 Stiffness of unspecified joint, not elsewhere classified: Secondary | ICD-10-CM | POA: Diagnosis not present

## 2020-10-27 DIAGNOSIS — R262 Difficulty in walking, not elsewhere classified: Secondary | ICD-10-CM | POA: Diagnosis not present

## 2020-10-27 DIAGNOSIS — G8254 Quadriplegia, C5-C7 incomplete: Secondary | ICD-10-CM | POA: Diagnosis not present

## 2020-10-27 DIAGNOSIS — M2560 Stiffness of unspecified joint, not elsewhere classified: Secondary | ICD-10-CM | POA: Diagnosis not present

## 2020-10-27 DIAGNOSIS — M6250 Muscle wasting and atrophy, not elsewhere classified, unspecified site: Secondary | ICD-10-CM | POA: Diagnosis not present

## 2020-10-27 DIAGNOSIS — K592 Neurogenic bowel, not elsewhere classified: Secondary | ICD-10-CM | POA: Diagnosis not present

## 2020-10-27 DIAGNOSIS — N319 Neuromuscular dysfunction of bladder, unspecified: Secondary | ICD-10-CM | POA: Diagnosis not present

## 2020-10-27 DIAGNOSIS — R29818 Other symptoms and signs involving the nervous system: Secondary | ICD-10-CM | POA: Diagnosis not present

## 2020-10-27 DIAGNOSIS — R29898 Other symptoms and signs involving the musculoskeletal system: Secondary | ICD-10-CM | POA: Diagnosis not present

## 2020-10-27 DIAGNOSIS — R2991 Unspecified symptoms and signs involving the musculoskeletal system: Secondary | ICD-10-CM | POA: Diagnosis not present

## 2020-10-28 DIAGNOSIS — R262 Difficulty in walking, not elsewhere classified: Secondary | ICD-10-CM | POA: Diagnosis not present

## 2020-10-28 DIAGNOSIS — N319 Neuromuscular dysfunction of bladder, unspecified: Secondary | ICD-10-CM | POA: Diagnosis not present

## 2020-10-28 DIAGNOSIS — R29818 Other symptoms and signs involving the nervous system: Secondary | ICD-10-CM | POA: Diagnosis not present

## 2020-10-28 DIAGNOSIS — R2991 Unspecified symptoms and signs involving the musculoskeletal system: Secondary | ICD-10-CM | POA: Diagnosis not present

## 2020-10-28 DIAGNOSIS — R29898 Other symptoms and signs involving the musculoskeletal system: Secondary | ICD-10-CM | POA: Diagnosis not present

## 2020-10-28 DIAGNOSIS — K592 Neurogenic bowel, not elsewhere classified: Secondary | ICD-10-CM | POA: Diagnosis not present

## 2020-10-28 DIAGNOSIS — M6250 Muscle wasting and atrophy, not elsewhere classified, unspecified site: Secondary | ICD-10-CM | POA: Diagnosis not present

## 2020-10-28 DIAGNOSIS — G8254 Quadriplegia, C5-C7 incomplete: Secondary | ICD-10-CM | POA: Diagnosis not present

## 2020-10-28 DIAGNOSIS — M2560 Stiffness of unspecified joint, not elsewhere classified: Secondary | ICD-10-CM | POA: Diagnosis not present

## 2020-10-29 DIAGNOSIS — G8254 Quadriplegia, C5-C7 incomplete: Secondary | ICD-10-CM | POA: Diagnosis not present

## 2020-10-29 DIAGNOSIS — R29818 Other symptoms and signs involving the nervous system: Secondary | ICD-10-CM | POA: Diagnosis not present

## 2020-10-29 DIAGNOSIS — R29898 Other symptoms and signs involving the musculoskeletal system: Secondary | ICD-10-CM | POA: Diagnosis not present

## 2020-10-29 DIAGNOSIS — M2560 Stiffness of unspecified joint, not elsewhere classified: Secondary | ICD-10-CM | POA: Diagnosis not present

## 2020-10-29 DIAGNOSIS — N319 Neuromuscular dysfunction of bladder, unspecified: Secondary | ICD-10-CM | POA: Diagnosis not present

## 2020-10-29 DIAGNOSIS — R262 Difficulty in walking, not elsewhere classified: Secondary | ICD-10-CM | POA: Diagnosis not present

## 2020-10-29 DIAGNOSIS — K592 Neurogenic bowel, not elsewhere classified: Secondary | ICD-10-CM | POA: Diagnosis not present

## 2020-10-29 DIAGNOSIS — R2991 Unspecified symptoms and signs involving the musculoskeletal system: Secondary | ICD-10-CM | POA: Diagnosis not present

## 2020-10-29 DIAGNOSIS — M6250 Muscle wasting and atrophy, not elsewhere classified, unspecified site: Secondary | ICD-10-CM | POA: Diagnosis not present

## 2020-10-30 DIAGNOSIS — R29898 Other symptoms and signs involving the musculoskeletal system: Secondary | ICD-10-CM | POA: Diagnosis not present

## 2020-10-30 DIAGNOSIS — R262 Difficulty in walking, not elsewhere classified: Secondary | ICD-10-CM | POA: Diagnosis not present

## 2020-10-30 DIAGNOSIS — K592 Neurogenic bowel, not elsewhere classified: Secondary | ICD-10-CM | POA: Diagnosis not present

## 2020-10-30 DIAGNOSIS — R29818 Other symptoms and signs involving the nervous system: Secondary | ICD-10-CM | POA: Diagnosis not present

## 2020-10-30 DIAGNOSIS — G8254 Quadriplegia, C5-C7 incomplete: Secondary | ICD-10-CM | POA: Diagnosis not present

## 2020-10-30 DIAGNOSIS — N319 Neuromuscular dysfunction of bladder, unspecified: Secondary | ICD-10-CM | POA: Diagnosis not present

## 2020-10-30 DIAGNOSIS — M6250 Muscle wasting and atrophy, not elsewhere classified, unspecified site: Secondary | ICD-10-CM | POA: Diagnosis not present

## 2020-10-30 DIAGNOSIS — R2991 Unspecified symptoms and signs involving the musculoskeletal system: Secondary | ICD-10-CM | POA: Diagnosis not present

## 2020-10-30 DIAGNOSIS — F439 Reaction to severe stress, unspecified: Secondary | ICD-10-CM | POA: Diagnosis not present

## 2020-10-30 DIAGNOSIS — M2560 Stiffness of unspecified joint, not elsewhere classified: Secondary | ICD-10-CM | POA: Diagnosis not present

## 2020-10-30 DIAGNOSIS — G8250 Quadriplegia, unspecified: Secondary | ICD-10-CM | POA: Diagnosis not present

## 2020-10-31 DIAGNOSIS — K592 Neurogenic bowel, not elsewhere classified: Secondary | ICD-10-CM | POA: Diagnosis not present

## 2020-10-31 DIAGNOSIS — G8254 Quadriplegia, C5-C7 incomplete: Secondary | ICD-10-CM | POA: Diagnosis not present

## 2020-10-31 DIAGNOSIS — M2560 Stiffness of unspecified joint, not elsewhere classified: Secondary | ICD-10-CM | POA: Diagnosis not present

## 2020-10-31 DIAGNOSIS — R2991 Unspecified symptoms and signs involving the musculoskeletal system: Secondary | ICD-10-CM | POA: Diagnosis not present

## 2020-10-31 DIAGNOSIS — R262 Difficulty in walking, not elsewhere classified: Secondary | ICD-10-CM | POA: Diagnosis not present

## 2020-10-31 DIAGNOSIS — N319 Neuromuscular dysfunction of bladder, unspecified: Secondary | ICD-10-CM | POA: Diagnosis not present

## 2020-10-31 DIAGNOSIS — M6250 Muscle wasting and atrophy, not elsewhere classified, unspecified site: Secondary | ICD-10-CM | POA: Diagnosis not present

## 2020-10-31 DIAGNOSIS — R29898 Other symptoms and signs involving the musculoskeletal system: Secondary | ICD-10-CM | POA: Diagnosis not present

## 2020-10-31 DIAGNOSIS — R29818 Other symptoms and signs involving the nervous system: Secondary | ICD-10-CM | POA: Diagnosis not present

## 2020-11-03 DIAGNOSIS — R262 Difficulty in walking, not elsewhere classified: Secondary | ICD-10-CM | POA: Diagnosis not present

## 2020-11-03 DIAGNOSIS — M6250 Muscle wasting and atrophy, not elsewhere classified, unspecified site: Secondary | ICD-10-CM | POA: Diagnosis not present

## 2020-11-03 DIAGNOSIS — M2560 Stiffness of unspecified joint, not elsewhere classified: Secondary | ICD-10-CM | POA: Diagnosis not present

## 2020-11-03 DIAGNOSIS — K592 Neurogenic bowel, not elsewhere classified: Secondary | ICD-10-CM | POA: Diagnosis not present

## 2020-11-03 DIAGNOSIS — N319 Neuromuscular dysfunction of bladder, unspecified: Secondary | ICD-10-CM | POA: Diagnosis not present

## 2020-11-03 DIAGNOSIS — G8254 Quadriplegia, C5-C7 incomplete: Secondary | ICD-10-CM | POA: Diagnosis not present

## 2020-11-03 DIAGNOSIS — R2991 Unspecified symptoms and signs involving the musculoskeletal system: Secondary | ICD-10-CM | POA: Diagnosis not present

## 2020-11-03 DIAGNOSIS — R29898 Other symptoms and signs involving the musculoskeletal system: Secondary | ICD-10-CM | POA: Diagnosis not present

## 2020-11-03 DIAGNOSIS — R29818 Other symptoms and signs involving the nervous system: Secondary | ICD-10-CM | POA: Diagnosis not present

## 2020-11-04 DIAGNOSIS — G8254 Quadriplegia, C5-C7 incomplete: Secondary | ICD-10-CM | POA: Diagnosis not present

## 2020-11-04 DIAGNOSIS — N319 Neuromuscular dysfunction of bladder, unspecified: Secondary | ICD-10-CM | POA: Diagnosis not present

## 2020-11-04 DIAGNOSIS — K592 Neurogenic bowel, not elsewhere classified: Secondary | ICD-10-CM | POA: Diagnosis not present

## 2020-11-04 DIAGNOSIS — R2991 Unspecified symptoms and signs involving the musculoskeletal system: Secondary | ICD-10-CM | POA: Diagnosis not present

## 2020-11-04 DIAGNOSIS — R29818 Other symptoms and signs involving the nervous system: Secondary | ICD-10-CM | POA: Diagnosis not present

## 2020-11-04 DIAGNOSIS — M2560 Stiffness of unspecified joint, not elsewhere classified: Secondary | ICD-10-CM | POA: Diagnosis not present

## 2020-11-04 DIAGNOSIS — R29898 Other symptoms and signs involving the musculoskeletal system: Secondary | ICD-10-CM | POA: Diagnosis not present

## 2020-11-04 DIAGNOSIS — M6250 Muscle wasting and atrophy, not elsewhere classified, unspecified site: Secondary | ICD-10-CM | POA: Diagnosis not present

## 2020-11-04 DIAGNOSIS — R262 Difficulty in walking, not elsewhere classified: Secondary | ICD-10-CM | POA: Diagnosis not present

## 2020-11-04 DIAGNOSIS — Z435 Encounter for attention to cystostomy: Secondary | ICD-10-CM | POA: Diagnosis not present

## 2020-11-05 DIAGNOSIS — M6250 Muscle wasting and atrophy, not elsewhere classified, unspecified site: Secondary | ICD-10-CM | POA: Diagnosis not present

## 2020-11-05 DIAGNOSIS — R262 Difficulty in walking, not elsewhere classified: Secondary | ICD-10-CM | POA: Diagnosis not present

## 2020-11-05 DIAGNOSIS — N319 Neuromuscular dysfunction of bladder, unspecified: Secondary | ICD-10-CM | POA: Diagnosis not present

## 2020-11-05 DIAGNOSIS — K592 Neurogenic bowel, not elsewhere classified: Secondary | ICD-10-CM | POA: Diagnosis not present

## 2020-11-05 DIAGNOSIS — M2560 Stiffness of unspecified joint, not elsewhere classified: Secondary | ICD-10-CM | POA: Diagnosis not present

## 2020-11-05 DIAGNOSIS — R2991 Unspecified symptoms and signs involving the musculoskeletal system: Secondary | ICD-10-CM | POA: Diagnosis not present

## 2020-11-05 DIAGNOSIS — R29818 Other symptoms and signs involving the nervous system: Secondary | ICD-10-CM | POA: Diagnosis not present

## 2020-11-05 DIAGNOSIS — R29898 Other symptoms and signs involving the musculoskeletal system: Secondary | ICD-10-CM | POA: Diagnosis not present

## 2020-11-05 DIAGNOSIS — G8254 Quadriplegia, C5-C7 incomplete: Secondary | ICD-10-CM | POA: Diagnosis not present

## 2020-11-06 DIAGNOSIS — R262 Difficulty in walking, not elsewhere classified: Secondary | ICD-10-CM | POA: Diagnosis not present

## 2020-11-06 DIAGNOSIS — N319 Neuromuscular dysfunction of bladder, unspecified: Secondary | ICD-10-CM | POA: Diagnosis not present

## 2020-11-06 DIAGNOSIS — M2560 Stiffness of unspecified joint, not elsewhere classified: Secondary | ICD-10-CM | POA: Diagnosis not present

## 2020-11-06 DIAGNOSIS — R29818 Other symptoms and signs involving the nervous system: Secondary | ICD-10-CM | POA: Diagnosis not present

## 2020-11-06 DIAGNOSIS — G8254 Quadriplegia, C5-C7 incomplete: Secondary | ICD-10-CM | POA: Diagnosis not present

## 2020-11-06 DIAGNOSIS — M6250 Muscle wasting and atrophy, not elsewhere classified, unspecified site: Secondary | ICD-10-CM | POA: Diagnosis not present

## 2020-11-06 DIAGNOSIS — K592 Neurogenic bowel, not elsewhere classified: Secondary | ICD-10-CM | POA: Diagnosis not present

## 2020-11-06 DIAGNOSIS — R29898 Other symptoms and signs involving the musculoskeletal system: Secondary | ICD-10-CM | POA: Diagnosis not present

## 2020-11-06 DIAGNOSIS — R2991 Unspecified symptoms and signs involving the musculoskeletal system: Secondary | ICD-10-CM | POA: Diagnosis not present

## 2020-11-07 DIAGNOSIS — N319 Neuromuscular dysfunction of bladder, unspecified: Secondary | ICD-10-CM | POA: Diagnosis not present

## 2020-11-07 DIAGNOSIS — K592 Neurogenic bowel, not elsewhere classified: Secondary | ICD-10-CM | POA: Diagnosis not present

## 2020-11-07 DIAGNOSIS — R29818 Other symptoms and signs involving the nervous system: Secondary | ICD-10-CM | POA: Diagnosis not present

## 2020-11-07 DIAGNOSIS — R262 Difficulty in walking, not elsewhere classified: Secondary | ICD-10-CM | POA: Diagnosis not present

## 2020-11-07 DIAGNOSIS — R2991 Unspecified symptoms and signs involving the musculoskeletal system: Secondary | ICD-10-CM | POA: Diagnosis not present

## 2020-11-07 DIAGNOSIS — M6250 Muscle wasting and atrophy, not elsewhere classified, unspecified site: Secondary | ICD-10-CM | POA: Diagnosis not present

## 2020-11-07 DIAGNOSIS — G8250 Quadriplegia, unspecified: Secondary | ICD-10-CM | POA: Diagnosis not present

## 2020-11-07 DIAGNOSIS — M2560 Stiffness of unspecified joint, not elsewhere classified: Secondary | ICD-10-CM | POA: Diagnosis not present

## 2020-11-07 DIAGNOSIS — G8254 Quadriplegia, C5-C7 incomplete: Secondary | ICD-10-CM | POA: Diagnosis not present

## 2020-11-07 DIAGNOSIS — R29898 Other symptoms and signs involving the musculoskeletal system: Secondary | ICD-10-CM | POA: Diagnosis not present

## 2020-11-07 DIAGNOSIS — F439 Reaction to severe stress, unspecified: Secondary | ICD-10-CM | POA: Diagnosis not present

## 2020-11-11 DIAGNOSIS — R29898 Other symptoms and signs involving the musculoskeletal system: Secondary | ICD-10-CM | POA: Diagnosis not present

## 2020-11-11 DIAGNOSIS — R262 Difficulty in walking, not elsewhere classified: Secondary | ICD-10-CM | POA: Diagnosis not present

## 2020-11-11 DIAGNOSIS — K592 Neurogenic bowel, not elsewhere classified: Secondary | ICD-10-CM | POA: Diagnosis not present

## 2020-11-11 DIAGNOSIS — M6250 Muscle wasting and atrophy, not elsewhere classified, unspecified site: Secondary | ICD-10-CM | POA: Diagnosis not present

## 2020-11-11 DIAGNOSIS — G8254 Quadriplegia, C5-C7 incomplete: Secondary | ICD-10-CM | POA: Diagnosis not present

## 2020-11-11 DIAGNOSIS — N319 Neuromuscular dysfunction of bladder, unspecified: Secondary | ICD-10-CM | POA: Diagnosis not present

## 2020-11-11 DIAGNOSIS — R2991 Unspecified symptoms and signs involving the musculoskeletal system: Secondary | ICD-10-CM | POA: Diagnosis not present

## 2020-11-11 DIAGNOSIS — M2560 Stiffness of unspecified joint, not elsewhere classified: Secondary | ICD-10-CM | POA: Diagnosis not present

## 2020-11-11 DIAGNOSIS — R29818 Other symptoms and signs involving the nervous system: Secondary | ICD-10-CM | POA: Diagnosis not present

## 2020-11-12 DIAGNOSIS — M6250 Muscle wasting and atrophy, not elsewhere classified, unspecified site: Secondary | ICD-10-CM | POA: Diagnosis not present

## 2020-11-12 DIAGNOSIS — R262 Difficulty in walking, not elsewhere classified: Secondary | ICD-10-CM | POA: Diagnosis not present

## 2020-11-12 DIAGNOSIS — R29898 Other symptoms and signs involving the musculoskeletal system: Secondary | ICD-10-CM | POA: Diagnosis not present

## 2020-11-12 DIAGNOSIS — N319 Neuromuscular dysfunction of bladder, unspecified: Secondary | ICD-10-CM | POA: Diagnosis not present

## 2020-11-12 DIAGNOSIS — R29818 Other symptoms and signs involving the nervous system: Secondary | ICD-10-CM | POA: Diagnosis not present

## 2020-11-12 DIAGNOSIS — G8254 Quadriplegia, C5-C7 incomplete: Secondary | ICD-10-CM | POA: Diagnosis not present

## 2020-11-12 DIAGNOSIS — K592 Neurogenic bowel, not elsewhere classified: Secondary | ICD-10-CM | POA: Diagnosis not present

## 2020-11-12 DIAGNOSIS — M2560 Stiffness of unspecified joint, not elsewhere classified: Secondary | ICD-10-CM | POA: Diagnosis not present

## 2020-11-12 DIAGNOSIS — R2991 Unspecified symptoms and signs involving the musculoskeletal system: Secondary | ICD-10-CM | POA: Diagnosis not present

## 2020-11-13 DIAGNOSIS — N319 Neuromuscular dysfunction of bladder, unspecified: Secondary | ICD-10-CM | POA: Diagnosis not present

## 2020-11-13 DIAGNOSIS — K592 Neurogenic bowel, not elsewhere classified: Secondary | ICD-10-CM | POA: Diagnosis not present

## 2020-11-13 DIAGNOSIS — M2560 Stiffness of unspecified joint, not elsewhere classified: Secondary | ICD-10-CM | POA: Diagnosis not present

## 2020-11-13 DIAGNOSIS — R262 Difficulty in walking, not elsewhere classified: Secondary | ICD-10-CM | POA: Diagnosis not present

## 2020-11-13 DIAGNOSIS — G8254 Quadriplegia, C5-C7 incomplete: Secondary | ICD-10-CM | POA: Diagnosis not present

## 2020-11-13 DIAGNOSIS — M6250 Muscle wasting and atrophy, not elsewhere classified, unspecified site: Secondary | ICD-10-CM | POA: Diagnosis not present

## 2020-11-13 DIAGNOSIS — R29898 Other symptoms and signs involving the musculoskeletal system: Secondary | ICD-10-CM | POA: Diagnosis not present

## 2020-11-13 DIAGNOSIS — R29818 Other symptoms and signs involving the nervous system: Secondary | ICD-10-CM | POA: Diagnosis not present

## 2020-11-13 DIAGNOSIS — R2991 Unspecified symptoms and signs involving the musculoskeletal system: Secondary | ICD-10-CM | POA: Diagnosis not present

## 2020-11-14 DIAGNOSIS — R262 Difficulty in walking, not elsewhere classified: Secondary | ICD-10-CM | POA: Diagnosis not present

## 2020-11-14 DIAGNOSIS — R29818 Other symptoms and signs involving the nervous system: Secondary | ICD-10-CM | POA: Diagnosis not present

## 2020-11-14 DIAGNOSIS — K592 Neurogenic bowel, not elsewhere classified: Secondary | ICD-10-CM | POA: Diagnosis not present

## 2020-11-14 DIAGNOSIS — M2560 Stiffness of unspecified joint, not elsewhere classified: Secondary | ICD-10-CM | POA: Diagnosis not present

## 2020-11-14 DIAGNOSIS — R2991 Unspecified symptoms and signs involving the musculoskeletal system: Secondary | ICD-10-CM | POA: Diagnosis not present

## 2020-11-14 DIAGNOSIS — R29898 Other symptoms and signs involving the musculoskeletal system: Secondary | ICD-10-CM | POA: Diagnosis not present

## 2020-11-14 DIAGNOSIS — N319 Neuromuscular dysfunction of bladder, unspecified: Secondary | ICD-10-CM | POA: Diagnosis not present

## 2020-11-14 DIAGNOSIS — G8254 Quadriplegia, C5-C7 incomplete: Secondary | ICD-10-CM | POA: Diagnosis not present

## 2020-11-14 DIAGNOSIS — M6250 Muscle wasting and atrophy, not elsewhere classified, unspecified site: Secondary | ICD-10-CM | POA: Diagnosis not present

## 2020-11-17 DIAGNOSIS — R262 Difficulty in walking, not elsewhere classified: Secondary | ICD-10-CM | POA: Diagnosis not present

## 2020-11-17 DIAGNOSIS — R29898 Other symptoms and signs involving the musculoskeletal system: Secondary | ICD-10-CM | POA: Diagnosis not present

## 2020-11-17 DIAGNOSIS — K592 Neurogenic bowel, not elsewhere classified: Secondary | ICD-10-CM | POA: Diagnosis not present

## 2020-11-17 DIAGNOSIS — M2560 Stiffness of unspecified joint, not elsewhere classified: Secondary | ICD-10-CM | POA: Diagnosis not present

## 2020-11-17 DIAGNOSIS — R2991 Unspecified symptoms and signs involving the musculoskeletal system: Secondary | ICD-10-CM | POA: Diagnosis not present

## 2020-11-17 DIAGNOSIS — N319 Neuromuscular dysfunction of bladder, unspecified: Secondary | ICD-10-CM | POA: Diagnosis not present

## 2020-11-17 DIAGNOSIS — G8254 Quadriplegia, C5-C7 incomplete: Secondary | ICD-10-CM | POA: Diagnosis not present

## 2020-11-17 DIAGNOSIS — M6250 Muscle wasting and atrophy, not elsewhere classified, unspecified site: Secondary | ICD-10-CM | POA: Diagnosis not present

## 2020-11-17 DIAGNOSIS — R29818 Other symptoms and signs involving the nervous system: Secondary | ICD-10-CM | POA: Diagnosis not present

## 2020-11-18 DIAGNOSIS — M6250 Muscle wasting and atrophy, not elsewhere classified, unspecified site: Secondary | ICD-10-CM | POA: Diagnosis not present

## 2020-11-18 DIAGNOSIS — K592 Neurogenic bowel, not elsewhere classified: Secondary | ICD-10-CM | POA: Diagnosis not present

## 2020-11-18 DIAGNOSIS — G8254 Quadriplegia, C5-C7 incomplete: Secondary | ICD-10-CM | POA: Diagnosis not present

## 2020-11-18 DIAGNOSIS — R29898 Other symptoms and signs involving the musculoskeletal system: Secondary | ICD-10-CM | POA: Diagnosis not present

## 2020-11-18 DIAGNOSIS — M2560 Stiffness of unspecified joint, not elsewhere classified: Secondary | ICD-10-CM | POA: Diagnosis not present

## 2020-11-18 DIAGNOSIS — R262 Difficulty in walking, not elsewhere classified: Secondary | ICD-10-CM | POA: Diagnosis not present

## 2020-11-18 DIAGNOSIS — R2991 Unspecified symptoms and signs involving the musculoskeletal system: Secondary | ICD-10-CM | POA: Diagnosis not present

## 2020-11-18 DIAGNOSIS — N319 Neuromuscular dysfunction of bladder, unspecified: Secondary | ICD-10-CM | POA: Diagnosis not present

## 2020-11-18 DIAGNOSIS — R29818 Other symptoms and signs involving the nervous system: Secondary | ICD-10-CM | POA: Diagnosis not present

## 2020-11-19 DIAGNOSIS — G8254 Quadriplegia, C5-C7 incomplete: Secondary | ICD-10-CM | POA: Diagnosis not present

## 2020-11-19 DIAGNOSIS — R29898 Other symptoms and signs involving the musculoskeletal system: Secondary | ICD-10-CM | POA: Diagnosis not present

## 2020-11-19 DIAGNOSIS — M2560 Stiffness of unspecified joint, not elsewhere classified: Secondary | ICD-10-CM | POA: Diagnosis not present

## 2020-11-19 DIAGNOSIS — R2991 Unspecified symptoms and signs involving the musculoskeletal system: Secondary | ICD-10-CM | POA: Diagnosis not present

## 2020-11-19 DIAGNOSIS — R29818 Other symptoms and signs involving the nervous system: Secondary | ICD-10-CM | POA: Diagnosis not present

## 2020-11-19 DIAGNOSIS — K592 Neurogenic bowel, not elsewhere classified: Secondary | ICD-10-CM | POA: Diagnosis not present

## 2020-11-19 DIAGNOSIS — M6250 Muscle wasting and atrophy, not elsewhere classified, unspecified site: Secondary | ICD-10-CM | POA: Diagnosis not present

## 2020-11-19 DIAGNOSIS — R262 Difficulty in walking, not elsewhere classified: Secondary | ICD-10-CM | POA: Diagnosis not present

## 2020-11-19 DIAGNOSIS — N319 Neuromuscular dysfunction of bladder, unspecified: Secondary | ICD-10-CM | POA: Diagnosis not present

## 2020-11-20 DIAGNOSIS — M6250 Muscle wasting and atrophy, not elsewhere classified, unspecified site: Secondary | ICD-10-CM | POA: Diagnosis not present

## 2020-11-20 DIAGNOSIS — G8250 Quadriplegia, unspecified: Secondary | ICD-10-CM | POA: Diagnosis not present

## 2020-11-20 DIAGNOSIS — R29898 Other symptoms and signs involving the musculoskeletal system: Secondary | ICD-10-CM | POA: Diagnosis not present

## 2020-11-20 DIAGNOSIS — R262 Difficulty in walking, not elsewhere classified: Secondary | ICD-10-CM | POA: Diagnosis not present

## 2020-11-20 DIAGNOSIS — K592 Neurogenic bowel, not elsewhere classified: Secondary | ICD-10-CM | POA: Diagnosis not present

## 2020-11-20 DIAGNOSIS — R29818 Other symptoms and signs involving the nervous system: Secondary | ICD-10-CM | POA: Diagnosis not present

## 2020-11-20 DIAGNOSIS — R2991 Unspecified symptoms and signs involving the musculoskeletal system: Secondary | ICD-10-CM | POA: Diagnosis not present

## 2020-11-20 DIAGNOSIS — M2560 Stiffness of unspecified joint, not elsewhere classified: Secondary | ICD-10-CM | POA: Diagnosis not present

## 2020-11-20 DIAGNOSIS — G8254 Quadriplegia, C5-C7 incomplete: Secondary | ICD-10-CM | POA: Diagnosis not present

## 2020-11-20 DIAGNOSIS — N319 Neuromuscular dysfunction of bladder, unspecified: Secondary | ICD-10-CM | POA: Diagnosis not present

## 2020-11-20 DIAGNOSIS — F439 Reaction to severe stress, unspecified: Secondary | ICD-10-CM | POA: Diagnosis not present

## 2020-11-21 DIAGNOSIS — N319 Neuromuscular dysfunction of bladder, unspecified: Secondary | ICD-10-CM | POA: Diagnosis not present

## 2020-11-21 DIAGNOSIS — R2991 Unspecified symptoms and signs involving the musculoskeletal system: Secondary | ICD-10-CM | POA: Diagnosis not present

## 2020-11-21 DIAGNOSIS — R29898 Other symptoms and signs involving the musculoskeletal system: Secondary | ICD-10-CM | POA: Diagnosis not present

## 2020-11-21 DIAGNOSIS — M6250 Muscle wasting and atrophy, not elsewhere classified, unspecified site: Secondary | ICD-10-CM | POA: Diagnosis not present

## 2020-11-21 DIAGNOSIS — G8254 Quadriplegia, C5-C7 incomplete: Secondary | ICD-10-CM | POA: Diagnosis not present

## 2020-11-21 DIAGNOSIS — R262 Difficulty in walking, not elsewhere classified: Secondary | ICD-10-CM | POA: Diagnosis not present

## 2020-11-21 DIAGNOSIS — K592 Neurogenic bowel, not elsewhere classified: Secondary | ICD-10-CM | POA: Diagnosis not present

## 2020-11-21 DIAGNOSIS — R29818 Other symptoms and signs involving the nervous system: Secondary | ICD-10-CM | POA: Diagnosis not present

## 2020-11-21 DIAGNOSIS — M2560 Stiffness of unspecified joint, not elsewhere classified: Secondary | ICD-10-CM | POA: Diagnosis not present

## 2020-11-24 DIAGNOSIS — M2560 Stiffness of unspecified joint, not elsewhere classified: Secondary | ICD-10-CM | POA: Diagnosis not present

## 2020-11-24 DIAGNOSIS — R29898 Other symptoms and signs involving the musculoskeletal system: Secondary | ICD-10-CM | POA: Diagnosis not present

## 2020-11-24 DIAGNOSIS — M6250 Muscle wasting and atrophy, not elsewhere classified, unspecified site: Secondary | ICD-10-CM | POA: Diagnosis not present

## 2020-11-24 DIAGNOSIS — N319 Neuromuscular dysfunction of bladder, unspecified: Secondary | ICD-10-CM | POA: Diagnosis not present

## 2020-11-24 DIAGNOSIS — G8254 Quadriplegia, C5-C7 incomplete: Secondary | ICD-10-CM | POA: Diagnosis not present

## 2020-11-24 DIAGNOSIS — R29818 Other symptoms and signs involving the nervous system: Secondary | ICD-10-CM | POA: Diagnosis not present

## 2020-11-24 DIAGNOSIS — R262 Difficulty in walking, not elsewhere classified: Secondary | ICD-10-CM | POA: Diagnosis not present

## 2020-11-24 DIAGNOSIS — K592 Neurogenic bowel, not elsewhere classified: Secondary | ICD-10-CM | POA: Diagnosis not present

## 2020-11-24 DIAGNOSIS — R2991 Unspecified symptoms and signs involving the musculoskeletal system: Secondary | ICD-10-CM | POA: Diagnosis not present

## 2020-11-25 DIAGNOSIS — G8254 Quadriplegia, C5-C7 incomplete: Secondary | ICD-10-CM | POA: Diagnosis not present

## 2020-11-25 DIAGNOSIS — M2560 Stiffness of unspecified joint, not elsewhere classified: Secondary | ICD-10-CM | POA: Diagnosis not present

## 2020-11-25 DIAGNOSIS — R2991 Unspecified symptoms and signs involving the musculoskeletal system: Secondary | ICD-10-CM | POA: Diagnosis not present

## 2020-11-25 DIAGNOSIS — K592 Neurogenic bowel, not elsewhere classified: Secondary | ICD-10-CM | POA: Diagnosis not present

## 2020-11-25 DIAGNOSIS — R29818 Other symptoms and signs involving the nervous system: Secondary | ICD-10-CM | POA: Diagnosis not present

## 2020-11-25 DIAGNOSIS — M6250 Muscle wasting and atrophy, not elsewhere classified, unspecified site: Secondary | ICD-10-CM | POA: Diagnosis not present

## 2020-11-25 DIAGNOSIS — N319 Neuromuscular dysfunction of bladder, unspecified: Secondary | ICD-10-CM | POA: Diagnosis not present

## 2020-11-25 DIAGNOSIS — R262 Difficulty in walking, not elsewhere classified: Secondary | ICD-10-CM | POA: Diagnosis not present

## 2020-11-25 DIAGNOSIS — R29898 Other symptoms and signs involving the musculoskeletal system: Secondary | ICD-10-CM | POA: Diagnosis not present

## 2020-11-26 DIAGNOSIS — M6250 Muscle wasting and atrophy, not elsewhere classified, unspecified site: Secondary | ICD-10-CM | POA: Diagnosis not present

## 2020-11-26 DIAGNOSIS — R2991 Unspecified symptoms and signs involving the musculoskeletal system: Secondary | ICD-10-CM | POA: Diagnosis not present

## 2020-11-26 DIAGNOSIS — R262 Difficulty in walking, not elsewhere classified: Secondary | ICD-10-CM | POA: Diagnosis not present

## 2020-11-26 DIAGNOSIS — R29898 Other symptoms and signs involving the musculoskeletal system: Secondary | ICD-10-CM | POA: Diagnosis not present

## 2020-11-26 DIAGNOSIS — R29818 Other symptoms and signs involving the nervous system: Secondary | ICD-10-CM | POA: Diagnosis not present

## 2020-11-26 DIAGNOSIS — G8254 Quadriplegia, C5-C7 incomplete: Secondary | ICD-10-CM | POA: Diagnosis not present

## 2020-11-26 DIAGNOSIS — K592 Neurogenic bowel, not elsewhere classified: Secondary | ICD-10-CM | POA: Diagnosis not present

## 2020-11-26 DIAGNOSIS — N319 Neuromuscular dysfunction of bladder, unspecified: Secondary | ICD-10-CM | POA: Diagnosis not present

## 2020-11-26 DIAGNOSIS — M2560 Stiffness of unspecified joint, not elsewhere classified: Secondary | ICD-10-CM | POA: Diagnosis not present

## 2020-11-27 DIAGNOSIS — R262 Difficulty in walking, not elsewhere classified: Secondary | ICD-10-CM | POA: Diagnosis not present

## 2020-11-27 DIAGNOSIS — R29818 Other symptoms and signs involving the nervous system: Secondary | ICD-10-CM | POA: Diagnosis not present

## 2020-11-27 DIAGNOSIS — M6250 Muscle wasting and atrophy, not elsewhere classified, unspecified site: Secondary | ICD-10-CM | POA: Diagnosis not present

## 2020-11-27 DIAGNOSIS — R2991 Unspecified symptoms and signs involving the musculoskeletal system: Secondary | ICD-10-CM | POA: Diagnosis not present

## 2020-11-27 DIAGNOSIS — R29898 Other symptoms and signs involving the musculoskeletal system: Secondary | ICD-10-CM | POA: Diagnosis not present

## 2020-11-27 DIAGNOSIS — M2560 Stiffness of unspecified joint, not elsewhere classified: Secondary | ICD-10-CM | POA: Diagnosis not present

## 2020-11-27 DIAGNOSIS — K592 Neurogenic bowel, not elsewhere classified: Secondary | ICD-10-CM | POA: Diagnosis not present

## 2020-11-27 DIAGNOSIS — N319 Neuromuscular dysfunction of bladder, unspecified: Secondary | ICD-10-CM | POA: Diagnosis not present

## 2020-11-27 DIAGNOSIS — G8254 Quadriplegia, C5-C7 incomplete: Secondary | ICD-10-CM | POA: Diagnosis not present

## 2020-11-28 DIAGNOSIS — M2560 Stiffness of unspecified joint, not elsewhere classified: Secondary | ICD-10-CM | POA: Diagnosis not present

## 2020-11-28 DIAGNOSIS — R2991 Unspecified symptoms and signs involving the musculoskeletal system: Secondary | ICD-10-CM | POA: Diagnosis not present

## 2020-11-28 DIAGNOSIS — G8250 Quadriplegia, unspecified: Secondary | ICD-10-CM | POA: Diagnosis not present

## 2020-11-28 DIAGNOSIS — R262 Difficulty in walking, not elsewhere classified: Secondary | ICD-10-CM | POA: Diagnosis not present

## 2020-11-28 DIAGNOSIS — G8254 Quadriplegia, C5-C7 incomplete: Secondary | ICD-10-CM | POA: Diagnosis not present

## 2020-11-28 DIAGNOSIS — K592 Neurogenic bowel, not elsewhere classified: Secondary | ICD-10-CM | POA: Diagnosis not present

## 2020-11-28 DIAGNOSIS — F439 Reaction to severe stress, unspecified: Secondary | ICD-10-CM | POA: Diagnosis not present

## 2020-11-28 DIAGNOSIS — R29818 Other symptoms and signs involving the nervous system: Secondary | ICD-10-CM | POA: Diagnosis not present

## 2020-11-28 DIAGNOSIS — R29898 Other symptoms and signs involving the musculoskeletal system: Secondary | ICD-10-CM | POA: Diagnosis not present

## 2020-11-28 DIAGNOSIS — N319 Neuromuscular dysfunction of bladder, unspecified: Secondary | ICD-10-CM | POA: Diagnosis not present

## 2020-11-28 DIAGNOSIS — M6250 Muscle wasting and atrophy, not elsewhere classified, unspecified site: Secondary | ICD-10-CM | POA: Diagnosis not present

## 2020-12-01 DIAGNOSIS — R29818 Other symptoms and signs involving the nervous system: Secondary | ICD-10-CM | POA: Diagnosis not present

## 2020-12-01 DIAGNOSIS — G8254 Quadriplegia, C5-C7 incomplete: Secondary | ICD-10-CM | POA: Diagnosis not present

## 2020-12-01 DIAGNOSIS — R29898 Other symptoms and signs involving the musculoskeletal system: Secondary | ICD-10-CM | POA: Diagnosis not present

## 2020-12-01 DIAGNOSIS — R262 Difficulty in walking, not elsewhere classified: Secondary | ICD-10-CM | POA: Diagnosis not present

## 2020-12-01 DIAGNOSIS — K592 Neurogenic bowel, not elsewhere classified: Secondary | ICD-10-CM | POA: Diagnosis not present

## 2020-12-01 DIAGNOSIS — R2991 Unspecified symptoms and signs involving the musculoskeletal system: Secondary | ICD-10-CM | POA: Diagnosis not present

## 2020-12-01 DIAGNOSIS — N319 Neuromuscular dysfunction of bladder, unspecified: Secondary | ICD-10-CM | POA: Diagnosis not present

## 2020-12-01 DIAGNOSIS — M6250 Muscle wasting and atrophy, not elsewhere classified, unspecified site: Secondary | ICD-10-CM | POA: Diagnosis not present

## 2020-12-01 DIAGNOSIS — M2560 Stiffness of unspecified joint, not elsewhere classified: Secondary | ICD-10-CM | POA: Diagnosis not present

## 2020-12-02 DIAGNOSIS — R262 Difficulty in walking, not elsewhere classified: Secondary | ICD-10-CM | POA: Diagnosis not present

## 2020-12-02 DIAGNOSIS — G8254 Quadriplegia, C5-C7 incomplete: Secondary | ICD-10-CM | POA: Diagnosis not present

## 2020-12-02 DIAGNOSIS — R29898 Other symptoms and signs involving the musculoskeletal system: Secondary | ICD-10-CM | POA: Diagnosis not present

## 2020-12-02 DIAGNOSIS — M2560 Stiffness of unspecified joint, not elsewhere classified: Secondary | ICD-10-CM | POA: Diagnosis not present

## 2020-12-02 DIAGNOSIS — K592 Neurogenic bowel, not elsewhere classified: Secondary | ICD-10-CM | POA: Diagnosis not present

## 2020-12-02 DIAGNOSIS — M6250 Muscle wasting and atrophy, not elsewhere classified, unspecified site: Secondary | ICD-10-CM | POA: Diagnosis not present

## 2020-12-02 DIAGNOSIS — N319 Neuromuscular dysfunction of bladder, unspecified: Secondary | ICD-10-CM | POA: Diagnosis not present

## 2020-12-02 DIAGNOSIS — R29818 Other symptoms and signs involving the nervous system: Secondary | ICD-10-CM | POA: Diagnosis not present

## 2020-12-02 DIAGNOSIS — R2991 Unspecified symptoms and signs involving the musculoskeletal system: Secondary | ICD-10-CM | POA: Diagnosis not present

## 2020-12-03 DIAGNOSIS — R2991 Unspecified symptoms and signs involving the musculoskeletal system: Secondary | ICD-10-CM | POA: Diagnosis not present

## 2020-12-03 DIAGNOSIS — M6250 Muscle wasting and atrophy, not elsewhere classified, unspecified site: Secondary | ICD-10-CM | POA: Diagnosis not present

## 2020-12-03 DIAGNOSIS — R29818 Other symptoms and signs involving the nervous system: Secondary | ICD-10-CM | POA: Diagnosis not present

## 2020-12-03 DIAGNOSIS — G8254 Quadriplegia, C5-C7 incomplete: Secondary | ICD-10-CM | POA: Diagnosis not present

## 2020-12-03 DIAGNOSIS — M2560 Stiffness of unspecified joint, not elsewhere classified: Secondary | ICD-10-CM | POA: Diagnosis not present

## 2020-12-03 DIAGNOSIS — R29898 Other symptoms and signs involving the musculoskeletal system: Secondary | ICD-10-CM | POA: Diagnosis not present

## 2020-12-03 DIAGNOSIS — R262 Difficulty in walking, not elsewhere classified: Secondary | ICD-10-CM | POA: Diagnosis not present

## 2020-12-03 DIAGNOSIS — K592 Neurogenic bowel, not elsewhere classified: Secondary | ICD-10-CM | POA: Diagnosis not present

## 2020-12-03 DIAGNOSIS — N319 Neuromuscular dysfunction of bladder, unspecified: Secondary | ICD-10-CM | POA: Diagnosis not present

## 2020-12-04 DIAGNOSIS — K592 Neurogenic bowel, not elsewhere classified: Secondary | ICD-10-CM | POA: Diagnosis not present

## 2020-12-04 DIAGNOSIS — G8254 Quadriplegia, C5-C7 incomplete: Secondary | ICD-10-CM | POA: Diagnosis not present

## 2020-12-04 DIAGNOSIS — R29898 Other symptoms and signs involving the musculoskeletal system: Secondary | ICD-10-CM | POA: Diagnosis not present

## 2020-12-04 DIAGNOSIS — R2991 Unspecified symptoms and signs involving the musculoskeletal system: Secondary | ICD-10-CM | POA: Diagnosis not present

## 2020-12-04 DIAGNOSIS — M2560 Stiffness of unspecified joint, not elsewhere classified: Secondary | ICD-10-CM | POA: Diagnosis not present

## 2020-12-04 DIAGNOSIS — N319 Neuromuscular dysfunction of bladder, unspecified: Secondary | ICD-10-CM | POA: Diagnosis not present

## 2020-12-04 DIAGNOSIS — M6250 Muscle wasting and atrophy, not elsewhere classified, unspecified site: Secondary | ICD-10-CM | POA: Diagnosis not present

## 2020-12-04 DIAGNOSIS — R262 Difficulty in walking, not elsewhere classified: Secondary | ICD-10-CM | POA: Diagnosis not present

## 2020-12-04 DIAGNOSIS — R29818 Other symptoms and signs involving the nervous system: Secondary | ICD-10-CM | POA: Diagnosis not present

## 2020-12-05 DIAGNOSIS — N319 Neuromuscular dysfunction of bladder, unspecified: Secondary | ICD-10-CM | POA: Diagnosis not present

## 2020-12-05 DIAGNOSIS — R2991 Unspecified symptoms and signs involving the musculoskeletal system: Secondary | ICD-10-CM | POA: Diagnosis not present

## 2020-12-05 DIAGNOSIS — G8254 Quadriplegia, C5-C7 incomplete: Secondary | ICD-10-CM | POA: Diagnosis not present

## 2020-12-05 DIAGNOSIS — R29818 Other symptoms and signs involving the nervous system: Secondary | ICD-10-CM | POA: Diagnosis not present

## 2020-12-05 DIAGNOSIS — K592 Neurogenic bowel, not elsewhere classified: Secondary | ICD-10-CM | POA: Diagnosis not present

## 2020-12-05 DIAGNOSIS — N2889 Other specified disorders of kidney and ureter: Secondary | ICD-10-CM | POA: Diagnosis not present

## 2020-12-05 DIAGNOSIS — M2560 Stiffness of unspecified joint, not elsewhere classified: Secondary | ICD-10-CM | POA: Diagnosis not present

## 2020-12-05 DIAGNOSIS — R262 Difficulty in walking, not elsewhere classified: Secondary | ICD-10-CM | POA: Diagnosis not present

## 2020-12-05 DIAGNOSIS — R29898 Other symptoms and signs involving the musculoskeletal system: Secondary | ICD-10-CM | POA: Diagnosis not present

## 2020-12-05 DIAGNOSIS — M6250 Muscle wasting and atrophy, not elsewhere classified, unspecified site: Secondary | ICD-10-CM | POA: Diagnosis not present

## 2020-12-08 DIAGNOSIS — R2991 Unspecified symptoms and signs involving the musculoskeletal system: Secondary | ICD-10-CM | POA: Diagnosis not present

## 2020-12-08 DIAGNOSIS — R29898 Other symptoms and signs involving the musculoskeletal system: Secondary | ICD-10-CM | POA: Diagnosis not present

## 2020-12-08 DIAGNOSIS — R29818 Other symptoms and signs involving the nervous system: Secondary | ICD-10-CM | POA: Diagnosis not present

## 2020-12-08 DIAGNOSIS — G8254 Quadriplegia, C5-C7 incomplete: Secondary | ICD-10-CM | POA: Diagnosis not present

## 2020-12-08 DIAGNOSIS — R262 Difficulty in walking, not elsewhere classified: Secondary | ICD-10-CM | POA: Diagnosis not present

## 2020-12-08 DIAGNOSIS — K592 Neurogenic bowel, not elsewhere classified: Secondary | ICD-10-CM | POA: Diagnosis not present

## 2020-12-08 DIAGNOSIS — N319 Neuromuscular dysfunction of bladder, unspecified: Secondary | ICD-10-CM | POA: Diagnosis not present

## 2020-12-08 DIAGNOSIS — M6250 Muscle wasting and atrophy, not elsewhere classified, unspecified site: Secondary | ICD-10-CM | POA: Diagnosis not present

## 2020-12-08 DIAGNOSIS — M2560 Stiffness of unspecified joint, not elsewhere classified: Secondary | ICD-10-CM | POA: Diagnosis not present

## 2020-12-08 DIAGNOSIS — Z981 Arthrodesis status: Secondary | ICD-10-CM | POA: Diagnosis not present

## 2020-12-09 DIAGNOSIS — R2991 Unspecified symptoms and signs involving the musculoskeletal system: Secondary | ICD-10-CM | POA: Diagnosis not present

## 2020-12-09 DIAGNOSIS — R29818 Other symptoms and signs involving the nervous system: Secondary | ICD-10-CM | POA: Diagnosis not present

## 2020-12-09 DIAGNOSIS — K592 Neurogenic bowel, not elsewhere classified: Secondary | ICD-10-CM | POA: Diagnosis not present

## 2020-12-09 DIAGNOSIS — R29898 Other symptoms and signs involving the musculoskeletal system: Secondary | ICD-10-CM | POA: Diagnosis not present

## 2020-12-09 DIAGNOSIS — G8254 Quadriplegia, C5-C7 incomplete: Secondary | ICD-10-CM | POA: Diagnosis not present

## 2020-12-09 DIAGNOSIS — N319 Neuromuscular dysfunction of bladder, unspecified: Secondary | ICD-10-CM | POA: Diagnosis not present

## 2020-12-09 DIAGNOSIS — R262 Difficulty in walking, not elsewhere classified: Secondary | ICD-10-CM | POA: Diagnosis not present

## 2020-12-09 DIAGNOSIS — M6250 Muscle wasting and atrophy, not elsewhere classified, unspecified site: Secondary | ICD-10-CM | POA: Diagnosis not present

## 2020-12-09 DIAGNOSIS — M2560 Stiffness of unspecified joint, not elsewhere classified: Secondary | ICD-10-CM | POA: Diagnosis not present

## 2020-12-10 DIAGNOSIS — N319 Neuromuscular dysfunction of bladder, unspecified: Secondary | ICD-10-CM | POA: Diagnosis not present

## 2020-12-10 DIAGNOSIS — R262 Difficulty in walking, not elsewhere classified: Secondary | ICD-10-CM | POA: Diagnosis not present

## 2020-12-10 DIAGNOSIS — R2991 Unspecified symptoms and signs involving the musculoskeletal system: Secondary | ICD-10-CM | POA: Diagnosis not present

## 2020-12-10 DIAGNOSIS — M2560 Stiffness of unspecified joint, not elsewhere classified: Secondary | ICD-10-CM | POA: Diagnosis not present

## 2020-12-10 DIAGNOSIS — M6250 Muscle wasting and atrophy, not elsewhere classified, unspecified site: Secondary | ICD-10-CM | POA: Diagnosis not present

## 2020-12-10 DIAGNOSIS — R29898 Other symptoms and signs involving the musculoskeletal system: Secondary | ICD-10-CM | POA: Diagnosis not present

## 2020-12-10 DIAGNOSIS — K592 Neurogenic bowel, not elsewhere classified: Secondary | ICD-10-CM | POA: Diagnosis not present

## 2020-12-10 DIAGNOSIS — G8254 Quadriplegia, C5-C7 incomplete: Secondary | ICD-10-CM | POA: Diagnosis not present

## 2020-12-10 DIAGNOSIS — R29818 Other symptoms and signs involving the nervous system: Secondary | ICD-10-CM | POA: Diagnosis not present

## 2020-12-11 DIAGNOSIS — M6250 Muscle wasting and atrophy, not elsewhere classified, unspecified site: Secondary | ICD-10-CM | POA: Diagnosis not present

## 2020-12-11 DIAGNOSIS — R29818 Other symptoms and signs involving the nervous system: Secondary | ICD-10-CM | POA: Diagnosis not present

## 2020-12-11 DIAGNOSIS — M2560 Stiffness of unspecified joint, not elsewhere classified: Secondary | ICD-10-CM | POA: Diagnosis not present

## 2020-12-11 DIAGNOSIS — R2991 Unspecified symptoms and signs involving the musculoskeletal system: Secondary | ICD-10-CM | POA: Diagnosis not present

## 2020-12-11 DIAGNOSIS — R262 Difficulty in walking, not elsewhere classified: Secondary | ICD-10-CM | POA: Diagnosis not present

## 2020-12-11 DIAGNOSIS — R29898 Other symptoms and signs involving the musculoskeletal system: Secondary | ICD-10-CM | POA: Diagnosis not present

## 2020-12-11 DIAGNOSIS — N319 Neuromuscular dysfunction of bladder, unspecified: Secondary | ICD-10-CM | POA: Diagnosis not present

## 2020-12-11 DIAGNOSIS — G8254 Quadriplegia, C5-C7 incomplete: Secondary | ICD-10-CM | POA: Diagnosis not present

## 2020-12-11 DIAGNOSIS — K592 Neurogenic bowel, not elsewhere classified: Secondary | ICD-10-CM | POA: Diagnosis not present

## 2020-12-12 DIAGNOSIS — K592 Neurogenic bowel, not elsewhere classified: Secondary | ICD-10-CM | POA: Diagnosis not present

## 2020-12-12 DIAGNOSIS — R2991 Unspecified symptoms and signs involving the musculoskeletal system: Secondary | ICD-10-CM | POA: Diagnosis not present

## 2020-12-12 DIAGNOSIS — M6250 Muscle wasting and atrophy, not elsewhere classified, unspecified site: Secondary | ICD-10-CM | POA: Diagnosis not present

## 2020-12-12 DIAGNOSIS — R29898 Other symptoms and signs involving the musculoskeletal system: Secondary | ICD-10-CM | POA: Diagnosis not present

## 2020-12-12 DIAGNOSIS — G8254 Quadriplegia, C5-C7 incomplete: Secondary | ICD-10-CM | POA: Diagnosis not present

## 2020-12-12 DIAGNOSIS — N319 Neuromuscular dysfunction of bladder, unspecified: Secondary | ICD-10-CM | POA: Diagnosis not present

## 2020-12-12 DIAGNOSIS — R29818 Other symptoms and signs involving the nervous system: Secondary | ICD-10-CM | POA: Diagnosis not present

## 2020-12-12 DIAGNOSIS — M2560 Stiffness of unspecified joint, not elsewhere classified: Secondary | ICD-10-CM | POA: Diagnosis not present

## 2020-12-12 DIAGNOSIS — R262 Difficulty in walking, not elsewhere classified: Secondary | ICD-10-CM | POA: Diagnosis not present

## 2020-12-16 DIAGNOSIS — R29898 Other symptoms and signs involving the musculoskeletal system: Secondary | ICD-10-CM | POA: Diagnosis not present

## 2020-12-16 DIAGNOSIS — N319 Neuromuscular dysfunction of bladder, unspecified: Secondary | ICD-10-CM | POA: Diagnosis not present

## 2020-12-16 DIAGNOSIS — K592 Neurogenic bowel, not elsewhere classified: Secondary | ICD-10-CM | POA: Diagnosis not present

## 2020-12-16 DIAGNOSIS — R29818 Other symptoms and signs involving the nervous system: Secondary | ICD-10-CM | POA: Diagnosis not present

## 2020-12-16 DIAGNOSIS — M6250 Muscle wasting and atrophy, not elsewhere classified, unspecified site: Secondary | ICD-10-CM | POA: Diagnosis not present

## 2020-12-16 DIAGNOSIS — M2560 Stiffness of unspecified joint, not elsewhere classified: Secondary | ICD-10-CM | POA: Diagnosis not present

## 2020-12-16 DIAGNOSIS — R2991 Unspecified symptoms and signs involving the musculoskeletal system: Secondary | ICD-10-CM | POA: Diagnosis not present

## 2020-12-16 DIAGNOSIS — R262 Difficulty in walking, not elsewhere classified: Secondary | ICD-10-CM | POA: Diagnosis not present

## 2020-12-16 DIAGNOSIS — G8254 Quadriplegia, C5-C7 incomplete: Secondary | ICD-10-CM | POA: Diagnosis not present

## 2020-12-17 DIAGNOSIS — M2560 Stiffness of unspecified joint, not elsewhere classified: Secondary | ICD-10-CM | POA: Diagnosis not present

## 2020-12-17 DIAGNOSIS — G8254 Quadriplegia, C5-C7 incomplete: Secondary | ICD-10-CM | POA: Diagnosis not present

## 2020-12-17 DIAGNOSIS — R29898 Other symptoms and signs involving the musculoskeletal system: Secondary | ICD-10-CM | POA: Diagnosis not present

## 2020-12-17 DIAGNOSIS — R29818 Other symptoms and signs involving the nervous system: Secondary | ICD-10-CM | POA: Diagnosis not present

## 2020-12-17 DIAGNOSIS — K592 Neurogenic bowel, not elsewhere classified: Secondary | ICD-10-CM | POA: Diagnosis not present

## 2020-12-17 DIAGNOSIS — R262 Difficulty in walking, not elsewhere classified: Secondary | ICD-10-CM | POA: Diagnosis not present

## 2020-12-17 DIAGNOSIS — R2991 Unspecified symptoms and signs involving the musculoskeletal system: Secondary | ICD-10-CM | POA: Diagnosis not present

## 2020-12-17 DIAGNOSIS — M6250 Muscle wasting and atrophy, not elsewhere classified, unspecified site: Secondary | ICD-10-CM | POA: Diagnosis not present

## 2020-12-17 DIAGNOSIS — N319 Neuromuscular dysfunction of bladder, unspecified: Secondary | ICD-10-CM | POA: Diagnosis not present

## 2020-12-18 DIAGNOSIS — R2991 Unspecified symptoms and signs involving the musculoskeletal system: Secondary | ICD-10-CM | POA: Diagnosis not present

## 2020-12-18 DIAGNOSIS — M6250 Muscle wasting and atrophy, not elsewhere classified, unspecified site: Secondary | ICD-10-CM | POA: Diagnosis not present

## 2020-12-18 DIAGNOSIS — K592 Neurogenic bowel, not elsewhere classified: Secondary | ICD-10-CM | POA: Diagnosis not present

## 2020-12-18 DIAGNOSIS — M2560 Stiffness of unspecified joint, not elsewhere classified: Secondary | ICD-10-CM | POA: Diagnosis not present

## 2020-12-18 DIAGNOSIS — N319 Neuromuscular dysfunction of bladder, unspecified: Secondary | ICD-10-CM | POA: Diagnosis not present

## 2020-12-18 DIAGNOSIS — R262 Difficulty in walking, not elsewhere classified: Secondary | ICD-10-CM | POA: Diagnosis not present

## 2020-12-18 DIAGNOSIS — R29898 Other symptoms and signs involving the musculoskeletal system: Secondary | ICD-10-CM | POA: Diagnosis not present

## 2020-12-18 DIAGNOSIS — R29818 Other symptoms and signs involving the nervous system: Secondary | ICD-10-CM | POA: Diagnosis not present

## 2020-12-18 DIAGNOSIS — G8254 Quadriplegia, C5-C7 incomplete: Secondary | ICD-10-CM | POA: Diagnosis not present

## 2020-12-19 DIAGNOSIS — R2991 Unspecified symptoms and signs involving the musculoskeletal system: Secondary | ICD-10-CM | POA: Diagnosis not present

## 2020-12-19 DIAGNOSIS — M2560 Stiffness of unspecified joint, not elsewhere classified: Secondary | ICD-10-CM | POA: Diagnosis not present

## 2020-12-19 DIAGNOSIS — R29818 Other symptoms and signs involving the nervous system: Secondary | ICD-10-CM | POA: Diagnosis not present

## 2020-12-19 DIAGNOSIS — R262 Difficulty in walking, not elsewhere classified: Secondary | ICD-10-CM | POA: Diagnosis not present

## 2020-12-19 DIAGNOSIS — G8254 Quadriplegia, C5-C7 incomplete: Secondary | ICD-10-CM | POA: Diagnosis not present

## 2020-12-19 DIAGNOSIS — N319 Neuromuscular dysfunction of bladder, unspecified: Secondary | ICD-10-CM | POA: Diagnosis not present

## 2020-12-19 DIAGNOSIS — M6250 Muscle wasting and atrophy, not elsewhere classified, unspecified site: Secondary | ICD-10-CM | POA: Diagnosis not present

## 2020-12-19 DIAGNOSIS — R29898 Other symptoms and signs involving the musculoskeletal system: Secondary | ICD-10-CM | POA: Diagnosis not present

## 2020-12-19 DIAGNOSIS — K592 Neurogenic bowel, not elsewhere classified: Secondary | ICD-10-CM | POA: Diagnosis not present

## 2020-12-22 DIAGNOSIS — R29818 Other symptoms and signs involving the nervous system: Secondary | ICD-10-CM | POA: Diagnosis not present

## 2020-12-22 DIAGNOSIS — M2560 Stiffness of unspecified joint, not elsewhere classified: Secondary | ICD-10-CM | POA: Diagnosis not present

## 2020-12-22 DIAGNOSIS — N319 Neuromuscular dysfunction of bladder, unspecified: Secondary | ICD-10-CM | POA: Diagnosis not present

## 2020-12-22 DIAGNOSIS — K592 Neurogenic bowel, not elsewhere classified: Secondary | ICD-10-CM | POA: Diagnosis not present

## 2020-12-22 DIAGNOSIS — R2991 Unspecified symptoms and signs involving the musculoskeletal system: Secondary | ICD-10-CM | POA: Diagnosis not present

## 2020-12-22 DIAGNOSIS — R262 Difficulty in walking, not elsewhere classified: Secondary | ICD-10-CM | POA: Diagnosis not present

## 2020-12-22 DIAGNOSIS — R29898 Other symptoms and signs involving the musculoskeletal system: Secondary | ICD-10-CM | POA: Diagnosis not present

## 2020-12-22 DIAGNOSIS — G8254 Quadriplegia, C5-C7 incomplete: Secondary | ICD-10-CM | POA: Diagnosis not present

## 2020-12-22 DIAGNOSIS — G8250 Quadriplegia, unspecified: Secondary | ICD-10-CM | POA: Diagnosis not present

## 2020-12-22 DIAGNOSIS — F439 Reaction to severe stress, unspecified: Secondary | ICD-10-CM | POA: Diagnosis not present

## 2020-12-22 DIAGNOSIS — M6250 Muscle wasting and atrophy, not elsewhere classified, unspecified site: Secondary | ICD-10-CM | POA: Diagnosis not present

## 2020-12-23 DIAGNOSIS — N319 Neuromuscular dysfunction of bladder, unspecified: Secondary | ICD-10-CM | POA: Diagnosis not present

## 2020-12-23 DIAGNOSIS — G8254 Quadriplegia, C5-C7 incomplete: Secondary | ICD-10-CM | POA: Diagnosis not present

## 2020-12-23 DIAGNOSIS — R262 Difficulty in walking, not elsewhere classified: Secondary | ICD-10-CM | POA: Diagnosis not present

## 2020-12-23 DIAGNOSIS — M6250 Muscle wasting and atrophy, not elsewhere classified, unspecified site: Secondary | ICD-10-CM | POA: Diagnosis not present

## 2020-12-23 DIAGNOSIS — K592 Neurogenic bowel, not elsewhere classified: Secondary | ICD-10-CM | POA: Diagnosis not present

## 2020-12-23 DIAGNOSIS — R29818 Other symptoms and signs involving the nervous system: Secondary | ICD-10-CM | POA: Diagnosis not present

## 2020-12-23 DIAGNOSIS — R29898 Other symptoms and signs involving the musculoskeletal system: Secondary | ICD-10-CM | POA: Diagnosis not present

## 2020-12-23 DIAGNOSIS — M2560 Stiffness of unspecified joint, not elsewhere classified: Secondary | ICD-10-CM | POA: Diagnosis not present

## 2020-12-23 DIAGNOSIS — R2991 Unspecified symptoms and signs involving the musculoskeletal system: Secondary | ICD-10-CM | POA: Diagnosis not present

## 2020-12-24 DIAGNOSIS — R2991 Unspecified symptoms and signs involving the musculoskeletal system: Secondary | ICD-10-CM | POA: Diagnosis not present

## 2020-12-24 DIAGNOSIS — M2560 Stiffness of unspecified joint, not elsewhere classified: Secondary | ICD-10-CM | POA: Diagnosis not present

## 2020-12-24 DIAGNOSIS — N319 Neuromuscular dysfunction of bladder, unspecified: Secondary | ICD-10-CM | POA: Diagnosis not present

## 2020-12-24 DIAGNOSIS — K592 Neurogenic bowel, not elsewhere classified: Secondary | ICD-10-CM | POA: Diagnosis not present

## 2020-12-24 DIAGNOSIS — M6250 Muscle wasting and atrophy, not elsewhere classified, unspecified site: Secondary | ICD-10-CM | POA: Diagnosis not present

## 2020-12-24 DIAGNOSIS — R262 Difficulty in walking, not elsewhere classified: Secondary | ICD-10-CM | POA: Diagnosis not present

## 2020-12-24 DIAGNOSIS — G8254 Quadriplegia, C5-C7 incomplete: Secondary | ICD-10-CM | POA: Diagnosis not present

## 2020-12-24 DIAGNOSIS — R29818 Other symptoms and signs involving the nervous system: Secondary | ICD-10-CM | POA: Diagnosis not present

## 2020-12-24 DIAGNOSIS — R29898 Other symptoms and signs involving the musculoskeletal system: Secondary | ICD-10-CM | POA: Diagnosis not present

## 2020-12-25 DIAGNOSIS — G8254 Quadriplegia, C5-C7 incomplete: Secondary | ICD-10-CM | POA: Diagnosis not present

## 2020-12-25 DIAGNOSIS — N319 Neuromuscular dysfunction of bladder, unspecified: Secondary | ICD-10-CM | POA: Diagnosis not present

## 2020-12-25 DIAGNOSIS — K592 Neurogenic bowel, not elsewhere classified: Secondary | ICD-10-CM | POA: Diagnosis not present

## 2020-12-25 DIAGNOSIS — R29898 Other symptoms and signs involving the musculoskeletal system: Secondary | ICD-10-CM | POA: Diagnosis not present

## 2020-12-25 DIAGNOSIS — M6250 Muscle wasting and atrophy, not elsewhere classified, unspecified site: Secondary | ICD-10-CM | POA: Diagnosis not present

## 2020-12-25 DIAGNOSIS — R262 Difficulty in walking, not elsewhere classified: Secondary | ICD-10-CM | POA: Diagnosis not present

## 2020-12-25 DIAGNOSIS — R2991 Unspecified symptoms and signs involving the musculoskeletal system: Secondary | ICD-10-CM | POA: Diagnosis not present

## 2020-12-25 DIAGNOSIS — R29818 Other symptoms and signs involving the nervous system: Secondary | ICD-10-CM | POA: Diagnosis not present

## 2020-12-25 DIAGNOSIS — M2560 Stiffness of unspecified joint, not elsewhere classified: Secondary | ICD-10-CM | POA: Diagnosis not present

## 2020-12-26 DIAGNOSIS — R29898 Other symptoms and signs involving the musculoskeletal system: Secondary | ICD-10-CM | POA: Diagnosis not present

## 2020-12-26 DIAGNOSIS — R29818 Other symptoms and signs involving the nervous system: Secondary | ICD-10-CM | POA: Diagnosis not present

## 2020-12-26 DIAGNOSIS — N319 Neuromuscular dysfunction of bladder, unspecified: Secondary | ICD-10-CM | POA: Diagnosis not present

## 2020-12-26 DIAGNOSIS — M2560 Stiffness of unspecified joint, not elsewhere classified: Secondary | ICD-10-CM | POA: Diagnosis not present

## 2020-12-26 DIAGNOSIS — G8254 Quadriplegia, C5-C7 incomplete: Secondary | ICD-10-CM | POA: Diagnosis not present

## 2020-12-26 DIAGNOSIS — R2991 Unspecified symptoms and signs involving the musculoskeletal system: Secondary | ICD-10-CM | POA: Diagnosis not present

## 2020-12-26 DIAGNOSIS — R262 Difficulty in walking, not elsewhere classified: Secondary | ICD-10-CM | POA: Diagnosis not present

## 2020-12-26 DIAGNOSIS — M6250 Muscle wasting and atrophy, not elsewhere classified, unspecified site: Secondary | ICD-10-CM | POA: Diagnosis not present

## 2020-12-26 DIAGNOSIS — K592 Neurogenic bowel, not elsewhere classified: Secondary | ICD-10-CM | POA: Diagnosis not present

## 2020-12-29 DIAGNOSIS — R2991 Unspecified symptoms and signs involving the musculoskeletal system: Secondary | ICD-10-CM | POA: Diagnosis not present

## 2020-12-29 DIAGNOSIS — R29818 Other symptoms and signs involving the nervous system: Secondary | ICD-10-CM | POA: Diagnosis not present

## 2020-12-29 DIAGNOSIS — G8254 Quadriplegia, C5-C7 incomplete: Secondary | ICD-10-CM | POA: Diagnosis not present

## 2020-12-29 DIAGNOSIS — K592 Neurogenic bowel, not elsewhere classified: Secondary | ICD-10-CM | POA: Diagnosis not present

## 2020-12-29 DIAGNOSIS — M6250 Muscle wasting and atrophy, not elsewhere classified, unspecified site: Secondary | ICD-10-CM | POA: Diagnosis not present

## 2020-12-29 DIAGNOSIS — R262 Difficulty in walking, not elsewhere classified: Secondary | ICD-10-CM | POA: Diagnosis not present

## 2020-12-29 DIAGNOSIS — R29898 Other symptoms and signs involving the musculoskeletal system: Secondary | ICD-10-CM | POA: Diagnosis not present

## 2020-12-29 DIAGNOSIS — M2560 Stiffness of unspecified joint, not elsewhere classified: Secondary | ICD-10-CM | POA: Diagnosis not present

## 2020-12-29 DIAGNOSIS — N319 Neuromuscular dysfunction of bladder, unspecified: Secondary | ICD-10-CM | POA: Diagnosis not present

## 2020-12-30 DIAGNOSIS — N319 Neuromuscular dysfunction of bladder, unspecified: Secondary | ICD-10-CM | POA: Diagnosis not present

## 2020-12-30 DIAGNOSIS — K592 Neurogenic bowel, not elsewhere classified: Secondary | ICD-10-CM | POA: Diagnosis not present

## 2020-12-30 DIAGNOSIS — R2991 Unspecified symptoms and signs involving the musculoskeletal system: Secondary | ICD-10-CM | POA: Diagnosis not present

## 2020-12-30 DIAGNOSIS — G8254 Quadriplegia, C5-C7 incomplete: Secondary | ICD-10-CM | POA: Diagnosis not present

## 2020-12-30 DIAGNOSIS — M6250 Muscle wasting and atrophy, not elsewhere classified, unspecified site: Secondary | ICD-10-CM | POA: Diagnosis not present

## 2020-12-30 DIAGNOSIS — M2560 Stiffness of unspecified joint, not elsewhere classified: Secondary | ICD-10-CM | POA: Diagnosis not present

## 2020-12-30 DIAGNOSIS — R29818 Other symptoms and signs involving the nervous system: Secondary | ICD-10-CM | POA: Diagnosis not present

## 2020-12-30 DIAGNOSIS — R262 Difficulty in walking, not elsewhere classified: Secondary | ICD-10-CM | POA: Diagnosis not present

## 2020-12-30 DIAGNOSIS — R29898 Other symptoms and signs involving the musculoskeletal system: Secondary | ICD-10-CM | POA: Diagnosis not present

## 2020-12-31 DIAGNOSIS — K592 Neurogenic bowel, not elsewhere classified: Secondary | ICD-10-CM | POA: Diagnosis not present

## 2020-12-31 DIAGNOSIS — M2560 Stiffness of unspecified joint, not elsewhere classified: Secondary | ICD-10-CM | POA: Diagnosis not present

## 2020-12-31 DIAGNOSIS — M6250 Muscle wasting and atrophy, not elsewhere classified, unspecified site: Secondary | ICD-10-CM | POA: Diagnosis not present

## 2020-12-31 DIAGNOSIS — N319 Neuromuscular dysfunction of bladder, unspecified: Secondary | ICD-10-CM | POA: Diagnosis not present

## 2020-12-31 DIAGNOSIS — R2991 Unspecified symptoms and signs involving the musculoskeletal system: Secondary | ICD-10-CM | POA: Diagnosis not present

## 2020-12-31 DIAGNOSIS — R262 Difficulty in walking, not elsewhere classified: Secondary | ICD-10-CM | POA: Diagnosis not present

## 2020-12-31 DIAGNOSIS — R29898 Other symptoms and signs involving the musculoskeletal system: Secondary | ICD-10-CM | POA: Diagnosis not present

## 2020-12-31 DIAGNOSIS — G8254 Quadriplegia, C5-C7 incomplete: Secondary | ICD-10-CM | POA: Diagnosis not present

## 2020-12-31 DIAGNOSIS — R29818 Other symptoms and signs involving the nervous system: Secondary | ICD-10-CM | POA: Diagnosis not present

## 2021-01-01 DIAGNOSIS — G8254 Quadriplegia, C5-C7 incomplete: Secondary | ICD-10-CM | POA: Diagnosis not present

## 2021-01-01 DIAGNOSIS — R29818 Other symptoms and signs involving the nervous system: Secondary | ICD-10-CM | POA: Diagnosis not present

## 2021-01-01 DIAGNOSIS — M2560 Stiffness of unspecified joint, not elsewhere classified: Secondary | ICD-10-CM | POA: Diagnosis not present

## 2021-01-01 DIAGNOSIS — M6250 Muscle wasting and atrophy, not elsewhere classified, unspecified site: Secondary | ICD-10-CM | POA: Diagnosis not present

## 2021-01-01 DIAGNOSIS — R29898 Other symptoms and signs involving the musculoskeletal system: Secondary | ICD-10-CM | POA: Diagnosis not present

## 2021-01-01 DIAGNOSIS — R2991 Unspecified symptoms and signs involving the musculoskeletal system: Secondary | ICD-10-CM | POA: Diagnosis not present

## 2021-01-01 DIAGNOSIS — K592 Neurogenic bowel, not elsewhere classified: Secondary | ICD-10-CM | POA: Diagnosis not present

## 2021-01-01 DIAGNOSIS — R262 Difficulty in walking, not elsewhere classified: Secondary | ICD-10-CM | POA: Diagnosis not present

## 2021-01-01 DIAGNOSIS — N319 Neuromuscular dysfunction of bladder, unspecified: Secondary | ICD-10-CM | POA: Diagnosis not present

## 2021-01-13 ENCOUNTER — Other Ambulatory Visit: Payer: Self-pay

## 2021-01-13 DIAGNOSIS — R278 Other lack of coordination: Secondary | ICD-10-CM | POA: Diagnosis not present

## 2021-01-13 DIAGNOSIS — M542 Cervicalgia: Secondary | ICD-10-CM | POA: Diagnosis not present

## 2021-01-13 DIAGNOSIS — G8250 Quadriplegia, unspecified: Secondary | ICD-10-CM | POA: Diagnosis not present

## 2021-01-13 DIAGNOSIS — R338 Other retention of urine: Secondary | ICD-10-CM | POA: Diagnosis not present

## 2021-01-13 DIAGNOSIS — Z789 Other specified health status: Secondary | ICD-10-CM | POA: Diagnosis not present

## 2021-01-13 DIAGNOSIS — N319 Neuromuscular dysfunction of bladder, unspecified: Secondary | ICD-10-CM | POA: Diagnosis not present

## 2021-01-13 DIAGNOSIS — N312 Flaccid neuropathic bladder, not elsewhere classified: Secondary | ICD-10-CM | POA: Diagnosis not present

## 2021-01-13 DIAGNOSIS — G8254 Quadriplegia, C5-C7 incomplete: Secondary | ICD-10-CM | POA: Diagnosis not present

## 2021-01-13 DIAGNOSIS — R4189 Other symptoms and signs involving cognitive functions and awareness: Secondary | ICD-10-CM | POA: Diagnosis not present

## 2021-01-13 DIAGNOSIS — Z7409 Other reduced mobility: Secondary | ICD-10-CM | POA: Diagnosis not present

## 2021-01-13 DIAGNOSIS — R29898 Other symptoms and signs involving the musculoskeletal system: Secondary | ICD-10-CM | POA: Diagnosis not present

## 2021-01-14 ENCOUNTER — Encounter: Payer: Self-pay | Admitting: Internal Medicine

## 2021-01-14 ENCOUNTER — Ambulatory Visit (INDEPENDENT_AMBULATORY_CARE_PROVIDER_SITE_OTHER): Payer: Medicare PPO | Admitting: Internal Medicine

## 2021-01-14 VITALS — BP 118/70 | HR 85 | Temp 98.6°F

## 2021-01-14 DIAGNOSIS — G8254 Quadriplegia, C5-C7 incomplete: Secondary | ICD-10-CM

## 2021-01-14 DIAGNOSIS — Z79899 Other long term (current) drug therapy: Secondary | ICD-10-CM

## 2021-01-14 DIAGNOSIS — Z9359 Other cystostomy status: Secondary | ICD-10-CM

## 2021-01-14 DIAGNOSIS — Z8781 Personal history of (healed) traumatic fracture: Secondary | ICD-10-CM | POA: Diagnosis not present

## 2021-01-14 DIAGNOSIS — Z960 Presence of urogenital implants: Secondary | ICD-10-CM
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
## 2021-01-14 NOTE — Patient Instructions (Addendum)
Please  get pharmacy to send  request  for refills of meds we discussed .    Will explore  other options for aggressive   help with rehab of your spinal cord  injury  effects.   Can decrease  asa to 3 d per week  and will decide if should continue .

## 2021-01-16 ENCOUNTER — Encounter: Payer: Self-pay | Admitting: Internal Medicine

## 2021-01-16 DIAGNOSIS — Z9359 Other cystostomy status: Secondary | ICD-10-CM | POA: Insufficient documentation

## 2021-01-16 DIAGNOSIS — Z8781 Personal history of (healed) traumatic fracture: Secondary | ICD-10-CM | POA: Insufficient documentation

## 2021-01-16 DIAGNOSIS — G8254 Quadriplegia, C5-C7 incomplete: Secondary | ICD-10-CM | POA: Insufficient documentation

## 2021-01-20 DIAGNOSIS — R278 Other lack of coordination: Secondary | ICD-10-CM | POA: Diagnosis not present

## 2021-01-20 DIAGNOSIS — Z789 Other specified health status: Secondary | ICD-10-CM | POA: Diagnosis not present

## 2021-01-20 DIAGNOSIS — R252 Cramp and spasm: Secondary | ICD-10-CM | POA: Diagnosis not present

## 2021-01-20 DIAGNOSIS — R29898 Other symptoms and signs involving the musculoskeletal system: Secondary | ICD-10-CM | POA: Diagnosis not present

## 2021-01-20 DIAGNOSIS — Z7409 Other reduced mobility: Secondary | ICD-10-CM | POA: Diagnosis not present

## 2021-01-20 DIAGNOSIS — N319 Neuromuscular dysfunction of bladder, unspecified: Secondary | ICD-10-CM | POA: Diagnosis not present

## 2021-01-20 DIAGNOSIS — K592 Neurogenic bowel, not elsewhere classified: Secondary | ICD-10-CM | POA: Diagnosis not present

## 2021-01-20 DIAGNOSIS — G8254 Quadriplegia, C5-C7 incomplete: Secondary | ICD-10-CM | POA: Diagnosis not present

## 2021-01-20 DIAGNOSIS — R4189 Other symptoms and signs involving cognitive functions and awareness: Secondary | ICD-10-CM | POA: Diagnosis not present

## 2021-01-21 DIAGNOSIS — G8250 Quadriplegia, unspecified: Secondary | ICD-10-CM | POA: Diagnosis not present

## 2021-01-21 DIAGNOSIS — R29898 Other symptoms and signs involving the musculoskeletal system: Secondary | ICD-10-CM | POA: Diagnosis not present

## 2021-01-21 DIAGNOSIS — G8254 Quadriplegia, C5-C7 incomplete: Secondary | ICD-10-CM | POA: Diagnosis not present

## 2021-01-21 DIAGNOSIS — Z7409 Other reduced mobility: Secondary | ICD-10-CM | POA: Diagnosis not present

## 2021-01-22 DIAGNOSIS — M542 Cervicalgia: Secondary | ICD-10-CM | POA: Diagnosis not present

## 2021-01-22 DIAGNOSIS — G8254 Quadriplegia, C5-C7 incomplete: Secondary | ICD-10-CM | POA: Diagnosis not present

## 2021-01-22 DIAGNOSIS — R4189 Other symptoms and signs involving cognitive functions and awareness: Secondary | ICD-10-CM | POA: Diagnosis not present

## 2021-01-22 DIAGNOSIS — R278 Other lack of coordination: Secondary | ICD-10-CM | POA: Diagnosis not present

## 2021-01-22 DIAGNOSIS — Z789 Other specified health status: Secondary | ICD-10-CM | POA: Diagnosis not present

## 2021-01-22 DIAGNOSIS — R29898 Other symptoms and signs involving the musculoskeletal system: Secondary | ICD-10-CM | POA: Diagnosis not present

## 2021-01-24 ENCOUNTER — Telehealth: Payer: Medicare PPO | Admitting: Nurse Practitioner

## 2021-01-24 DIAGNOSIS — N3001 Acute cystitis with hematuria: Secondary | ICD-10-CM | POA: Diagnosis not present

## 2021-01-24 MED ORDER — CEPHALEXIN 500 MG PO CAPS: 500.0000 mg | ORAL_CAPSULE | Freq: Two times a day (BID) | ORAL | 0 refills | Status: DC

## 2021-01-24 NOTE — Progress Notes (Signed)
Virtual Visit Consent   Bernett Raysor, you are scheduled for a virtual visit with Mary-Margaret Hassell Done, [place], a [facility] provider, today.     Just as with appointments in the office, your consent must be obtained to participate.  Your consent will be active for this visit and any virtual visit you may have with one of our providers in the next 365 days.     If you have a MyChart account, a copy of this consent can be sent to you electronically.  All virtual visits are billed to your insurance company just like a traditional visit in the office.    As this is a virtual visit, video technology does not allow for your provider to perform a traditional examination.  This may limit your provider's ability to fully assess your condition.  If your provider identifies any concerns that need to be evaluated in person or the need to arrange testing (such as labs, EKG, etc.), we will make arrangements to do so.     Although advances in technology are sophisticated, we cannot ensure that it will always work on either your end or our end.  If the connection with a video visit is poor, the visit may have to be switched to a telephone visit.  With either a video or telephone visit, we are not always able to ensure that we have a secure connection.     I need to obtain your verbal consent now.   Are you willing to proceed with your visit today? YES   Eloisa Leete has provided verbal consent on 01/24/2021 for a virtual visit (video or telephone).   Mary-Margaret Hassell Done, FNP   Date: 01/24/2021 11:45 AM   Virtual Visit via Video Note   I, Mary-Margaret Hassell Done, connected with Estalene Kalu ([id number], [birth year]) on 01/24/21 at 11:30 AM EDT by a video-enabled telemedicine application and verified that I am speaking with the correct person using two identifiers.  Location: Patient: Virtual Visit Location Patient: Home Provider: Virtual Visit Location Provider: Mobile   I  discussed the limitations of evaluation and management by telemedicine and the availability of in person appointments. The patient expressed understanding and agreed to proceed.    History of Present Illness: [name] is a 70 y.o. who identifies as a female who was assigned female at birth, and is being seen today for UTI.  HPI: Ptient woke up this morning and noticed blood in her urine. She has had slight dysuria with incontinence Problems:  Patient Active Problem List   Diagnosis Date Noted   Quadriplegia, C5-C7 incomplete ([place]) 01/16/2021   History of spinal fracture 01/16/2021   Suprapubic catheter ([place]) 01/16/2021   Encounter for routine gynecological examination 09/28/2013   Onychomycosis 09/28/2013   Foot deformity, acquired 03/26/2012   Encounter for preventive health examination 12/25/2010   ROSACEA 08/25/2009   Disturbance in sleep behavior 03/11/2008   SKIN CANCER, HX OF 03/11/2008   DYSURIA, HX OF 03/11/2008   Hyperlipidemia 02/10/2007   CERVICALGIA 02/10/2007    Allergies: No Known Allergies Medications:  Current Outpatient Medications:    cephALEXin (KEFLEX) 500 MG capsule, Take 1 capsule (500 mg total) by mouth 2 (two) times daily., Disp: 14 capsule, Rfl: 0   ascorbic acid (VITAMIN C) 1000 MG tablet, Take by mouth., Disp: , Rfl:    aspirin 81 MG chewable tablet, Chew by mouth., Disp: , Rfl:    atorvastatin (LIPITOR) 20 MG tablet, Take 1 tablet (20 mg total)  by mouth daily., Disp: 90 tablet, Rfl: 3   baclofen (LIORESAL) 20 MG tablet, Take by mouth., Disp: , Rfl:    cholecalciferol (VITAMIN D3) 25 MCG (1000 UNIT) tablet, Take 1,000 Units by mouth daily., Disp: , Rfl:    Cranberry 200 MG CAPS, Take by mouth., Disp: , Rfl:    CVS COENZYME Q-10 100 MG capsule, Take 100 mg by mouth daily. , Disp: , Rfl:    Docusate Sodium (DSS) 100 MG CAPS, Take by mouth., Disp: , Rfl:    gabapentin (NEURONTIN) 600 MG tablet, Take by mouth., Disp: , Rfl:    metroNIDAZOLE  (METROGEL) 1 % gel, APPLY TOPICALLY EVERY DAY, Disp: 60 g, Rfl: 10   mirabegron ER (MYRBETRIQ) 25 MG TB24 tablet, Take by mouth., Disp: , Rfl:    Multiple Vitamins-Calcium (VIACTIV MULTI-VITAMIN) CHEW, Chew by mouth., Disp: , Rfl:    Multiple Vitamins-Minerals (CENTRUM SILVER ULTRA WOMENS PO), Take by mouth., Disp: , Rfl:    nortriptyline (PAMELOR) 25 MG capsule, Take by mouth., Disp: , Rfl:    oxyCODONE (OXY IR/ROXICODONE) 5 MG immediate release tablet, Take by mouth., Disp: , Rfl:    urea (CARMOL) 40 % CREA, Apply daily to callused areas of feet, Disp: 120 g, Rfl: 3   zolpidem (AMBIEN) 10 MG tablet, TAKE 1/2-1 TABLET BY MOUTH AT BEDTIME AS NEEDED FOR SLEEP, Disp: 30 tablet, Rfl: 0  Observations/Objective: Patient is well-developed, well-nourished in no acute distress.  Resting comfortably  at home.  Head is normocephalic, atraumatic.  No labored breathing.  Speech is clear and coherent with logical content.  Patient is alert and oriented at baseline.   Assessment and Plan:  Markham Jordan in today with chief complaint of No chief complaint on file.   1. Acute cystitis with hematuria Take medication as prescribe Cotton underwear Take shower not bath Cranberry juice, yogurt Force fluids AZO over the counter X2 days Meds ordered this encounter  Medications   cephALEXin (KEFLEX) 500 MG capsule    Sig: Take 1 capsule (500 mg total) by mouth 2 (two) times daily.    Dispense:  14 capsule    Refill:  0    Order Specific Question:   Supervising Provider    Answer:   Noemi Chapel [[id number]]        Follow Up Instructions: I discussed the assessment and treatment plan with the patient. The patient was provided an opportunity to ask questions and all were answered. The patient agreed with the plan and demonstrated an understanding of the instructions.  A copy of instructions were sent to the patient via MyChart.  The patient was advised to call back or seek an in-person  evaluation if the symptoms worsen or if the condition fails to improve as anticipated.  Time:  I spent 10 minutes with the patient via telehealth technology discussing the above problems/concerns.    Mary-Margaret Hassell Done, FNP

## 2021-01-26 DIAGNOSIS — R4189 Other symptoms and signs involving cognitive functions and awareness: Secondary | ICD-10-CM | POA: Diagnosis not present

## 2021-01-26 DIAGNOSIS — G8254 Quadriplegia, C5-C7 incomplete: Secondary | ICD-10-CM | POA: Diagnosis not present

## 2021-01-26 DIAGNOSIS — M542 Cervicalgia: Secondary | ICD-10-CM | POA: Diagnosis not present

## 2021-01-26 DIAGNOSIS — R29898 Other symptoms and signs involving the musculoskeletal system: Secondary | ICD-10-CM | POA: Diagnosis not present

## 2021-01-26 DIAGNOSIS — R278 Other lack of coordination: Secondary | ICD-10-CM | POA: Diagnosis not present

## 2021-01-26 DIAGNOSIS — Z789 Other specified health status: Secondary | ICD-10-CM | POA: Diagnosis not present

## 2021-01-28 DIAGNOSIS — R278 Other lack of coordination: Secondary | ICD-10-CM | POA: Diagnosis not present

## 2021-01-28 DIAGNOSIS — R4189 Other symptoms and signs involving cognitive functions and awareness: Secondary | ICD-10-CM | POA: Diagnosis not present

## 2021-01-28 DIAGNOSIS — Z789 Other specified health status: Secondary | ICD-10-CM | POA: Diagnosis not present

## 2021-01-28 DIAGNOSIS — G8254 Quadriplegia, C5-C7 incomplete: Secondary | ICD-10-CM | POA: Diagnosis not present

## 2021-01-28 DIAGNOSIS — M542 Cervicalgia: Secondary | ICD-10-CM | POA: Diagnosis not present

## 2021-01-28 DIAGNOSIS — R29898 Other symptoms and signs involving the musculoskeletal system: Secondary | ICD-10-CM | POA: Diagnosis not present

## 2021-02-02 ENCOUNTER — Other Ambulatory Visit: Payer: Self-pay

## 2021-02-02 ENCOUNTER — Other Ambulatory Visit: Payer: Medicare PPO

## 2021-02-02 ENCOUNTER — Telehealth: Payer: Self-pay | Admitting: Internal Medicine

## 2021-02-02 DIAGNOSIS — Z960 Presence of urogenital implants: Secondary | ICD-10-CM | POA: Diagnosis not present

## 2021-02-02 DIAGNOSIS — N390 Urinary tract infection, site not specified: Secondary | ICD-10-CM | POA: Diagnosis not present

## 2021-02-02 DIAGNOSIS — R29898 Other symptoms and signs involving the musculoskeletal system: Secondary | ICD-10-CM | POA: Diagnosis not present

## 2021-02-02 DIAGNOSIS — R4189 Other symptoms and signs involving cognitive functions and awareness: Secondary | ICD-10-CM | POA: Diagnosis not present

## 2021-02-02 DIAGNOSIS — M542 Cervicalgia: Secondary | ICD-10-CM | POA: Diagnosis not present

## 2021-02-02 DIAGNOSIS — Z789 Other specified health status: Secondary | ICD-10-CM | POA: Diagnosis not present

## 2021-02-02 DIAGNOSIS — R278 Other lack of coordination: Secondary | ICD-10-CM | POA: Diagnosis not present

## 2021-02-02 DIAGNOSIS — G8254 Quadriplegia, C5-C7 incomplete: Secondary | ICD-10-CM | POA: Diagnosis not present

## 2021-02-02 NOTE — Telephone Encounter (Signed)
I spoke with the pt and she stated that she has not been contacted by the Urologist to whom we referred. Pt stated that she want to have labs done in regards to her UTI and will be coming into the office in regards to this. Pt states that this is an ongoing issue and she would like to be seen asap for this issue. Pt want to drop off urine sample in clinic today.

## 2021-02-02 NOTE — Telephone Encounter (Signed)
Sorry you are not feeling better however I need more information to be able to help.  Do you have a fever or serious abdominal pain question what are your symptoms.? Leakage only   We need to get a urinalysis and clean urine culture as possible.   I can put an order in to have the urine specimen processed at that [facility] lab. But may be quicker  to   contact the urology office who is caring for the suprapubic catheter for advice and expedited help .  I agree that   a different antibiotic may be in order .  Let me know if urology can help witht this or we can get urine done through our labs

## 2021-02-02 NOTE — Telephone Encounter (Signed)
Pt call and stated dr.Panosh if she didn't her from her other Doctor to give her a call back and that is why she is call and want a call back soon.

## 2021-02-03 LAB — UNLABELED: Test Ordered On Req: 395

## 2021-02-03 MED ORDER — SULFAMETHOXAZOLE-TRIMETHOPRIM 800-160 MG PO TABS: 1.0000 | ORAL_TABLET | Freq: Two times a day (BID) | ORAL | 0 refills | Status: DC

## 2021-02-03 NOTE — Telephone Encounter (Signed)
So I do not have the urine culture back yet and unfortunately the urinalysis was not done, which would have been helpful in directing care.  Nevertheless if you are sure this is a UTI , I am willing to send in Bactrim Septra for 5 days pending the culture.  I would like you to get an appointment with urology in follow-up.  Discuss what you should do if you get UTIs symptoms in the future ,to hopefully make things easier to assess and give best treatment .   Be aware that  e visit s , are  not  done by clinicians in our practice , they have no path for follow up questions and help .at this time .   We have  virtual visit appts   during  office hours .

## 2021-02-03 NOTE — Progress Notes (Signed)
Please have lab help with this

## 2021-02-04 DIAGNOSIS — Z789 Other specified health status: Secondary | ICD-10-CM | POA: Diagnosis not present

## 2021-02-04 DIAGNOSIS — G8254 Quadriplegia, C5-C7 incomplete: Secondary | ICD-10-CM | POA: Diagnosis not present

## 2021-02-04 DIAGNOSIS — R29898 Other symptoms and signs involving the musculoskeletal system: Secondary | ICD-10-CM | POA: Diagnosis not present

## 2021-02-04 DIAGNOSIS — G8250 Quadriplegia, unspecified: Secondary | ICD-10-CM | POA: Diagnosis not present

## 2021-02-04 DIAGNOSIS — Z7409 Other reduced mobility: Secondary | ICD-10-CM | POA: Diagnosis not present

## 2021-02-04 DIAGNOSIS — M542 Cervicalgia: Secondary | ICD-10-CM | POA: Diagnosis not present

## 2021-02-04 DIAGNOSIS — R4189 Other symptoms and signs involving cognitive functions and awareness: Secondary | ICD-10-CM | POA: Diagnosis not present

## 2021-02-04 DIAGNOSIS — R278 Other lack of coordination: Secondary | ICD-10-CM | POA: Diagnosis not present

## 2021-02-05 LAB — URINE CULTURE
MICRO NUMBER:: 12279291
SPECIMEN QUALITY:: ADEQUATE

## 2021-02-05 LAB — PAT ID TIQ DOC: Test Affected: 395

## 2021-02-05 NOTE — Progress Notes (Signed)
Culture shows e coli sensitive to the septra given ( and resistant to the keflex given previously)    Should  get better with current  antibiotic     let us know  if not getting better  (but still advise   connecting with  urology for future   with problem utis  )

## 2021-02-09 ENCOUNTER — Ambulatory Visit: Payer: Medicare PPO | Admitting: Podiatry

## 2021-02-09 ENCOUNTER — Other Ambulatory Visit: Payer: Self-pay

## 2021-02-09 DIAGNOSIS — N312 Flaccid neuropathic bladder, not elsewhere classified: Secondary | ICD-10-CM | POA: Diagnosis not present

## 2021-02-09 DIAGNOSIS — L89890 Pressure ulcer of other site, unstageable: Secondary | ICD-10-CM | POA: Diagnosis not present

## 2021-02-09 DIAGNOSIS — L97511 Non-pressure chronic ulcer of other part of right foot limited to breakdown of skin: Secondary | ICD-10-CM

## 2021-02-09 MED ORDER — MUPIROCIN 2 % EX OINT: 1.0000 "application " | TOPICAL_OINTMENT | Freq: Two times a day (BID) | CUTANEOUS | 2 refills | Status: DC

## 2021-02-09 MED ORDER — SULFAMETHOXAZOLE-TRIMETHOPRIM 800-160 MG PO TABS: 1.0000 | ORAL_TABLET | Freq: Two times a day (BID) | ORAL | 0 refills | Status: AC

## 2021-02-10 ENCOUNTER — Other Ambulatory Visit: Payer: Self-pay | Admitting: Podiatry

## 2021-02-10 ENCOUNTER — Ambulatory Visit (HOSPITAL_COMMUNITY)
Admission: RE | Admit: 2021-02-10 | Discharge: 2021-02-10 | Disposition: A | Discharge status: Home / Self Care | Payer: Medicare PPO | Source: Ambulatory Visit | Attending: Cardiology | Admitting: Cardiology

## 2021-02-10 DIAGNOSIS — L97511 Non-pressure chronic ulcer of other part of right foot limited to breakdown of skin: Secondary | ICD-10-CM | POA: Diagnosis not present

## 2021-02-10 DIAGNOSIS — R4189 Other symptoms and signs involving cognitive functions and awareness: Secondary | ICD-10-CM | POA: Diagnosis not present

## 2021-02-10 DIAGNOSIS — G8250 Quadriplegia, unspecified: Secondary | ICD-10-CM | POA: Diagnosis not present

## 2021-02-10 DIAGNOSIS — R278 Other lack of coordination: Secondary | ICD-10-CM | POA: Diagnosis not present

## 2021-02-10 DIAGNOSIS — Z789 Other specified health status: Secondary | ICD-10-CM | POA: Diagnosis not present

## 2021-02-10 DIAGNOSIS — G8254 Quadriplegia, C5-C7 incomplete: Secondary | ICD-10-CM | POA: Diagnosis not present

## 2021-02-10 DIAGNOSIS — M542 Cervicalgia: Secondary | ICD-10-CM | POA: Diagnosis not present

## 2021-02-10 DIAGNOSIS — Z7409 Other reduced mobility: Secondary | ICD-10-CM | POA: Diagnosis not present

## 2021-02-10 DIAGNOSIS — R29898 Other symptoms and signs involving the musculoskeletal system: Secondary | ICD-10-CM | POA: Diagnosis not present

## 2021-02-10 MED ORDER — BACLOFEN 20 MG PO TABS: 20.0000 mg | ORAL_TABLET | Freq: Every day | ORAL | 1 refills | Status: DC

## 2021-02-10 MED ORDER — ATORVASTATIN CALCIUM 20 MG PO TABS: 20.0000 mg | ORAL_TABLET | Freq: Every day | ORAL | 1 refills | Status: DC

## 2021-02-10 NOTE — Telephone Encounter (Signed)
Please send in medicine for her baclofen prescription enough for 3 months Please send in atorvastatin prescription enough for 6 months.

## 2021-02-11 ENCOUNTER — Encounter: Payer: Self-pay | Admitting: Podiatry
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
## 2021-02-12 DIAGNOSIS — Z7409 Other reduced mobility: Secondary | ICD-10-CM | POA: Diagnosis not present

## 2021-02-12 DIAGNOSIS — R279 Unspecified lack of coordination: Secondary | ICD-10-CM | POA: Diagnosis not present

## 2021-02-12 DIAGNOSIS — R29898 Other symptoms and signs involving the musculoskeletal system: Secondary | ICD-10-CM | POA: Diagnosis not present

## 2021-02-12 DIAGNOSIS — Z789 Other specified health status: Secondary | ICD-10-CM | POA: Diagnosis not present

## 2021-02-12 DIAGNOSIS — G8250 Quadriplegia, unspecified: Secondary | ICD-10-CM | POA: Diagnosis not present

## 2021-02-12 DIAGNOSIS — G8254 Quadriplegia, C5-C7 incomplete: Secondary | ICD-10-CM | POA: Diagnosis not present

## 2021-02-12 DIAGNOSIS — M542 Cervicalgia: Secondary | ICD-10-CM | POA: Diagnosis not present

## 2021-02-12 DIAGNOSIS — R4189 Other symptoms and signs involving cognitive functions and awareness: Secondary | ICD-10-CM | POA: Diagnosis not present

## 2021-02-17 DIAGNOSIS — Z7409 Other reduced mobility: Secondary | ICD-10-CM | POA: Diagnosis not present

## 2021-02-17 DIAGNOSIS — R4189 Other symptoms and signs involving cognitive functions and awareness: Secondary | ICD-10-CM | POA: Diagnosis not present

## 2021-02-17 DIAGNOSIS — Z789 Other specified health status: Secondary | ICD-10-CM | POA: Diagnosis not present

## 2021-02-17 DIAGNOSIS — G8250 Quadriplegia, unspecified: Secondary | ICD-10-CM | POA: Diagnosis not present

## 2021-02-17 DIAGNOSIS — G8254 Quadriplegia, C5-C7 incomplete: Secondary | ICD-10-CM | POA: Diagnosis not present

## 2021-02-17 DIAGNOSIS — R279 Unspecified lack of coordination: Secondary | ICD-10-CM | POA: Diagnosis not present

## 2021-02-17 DIAGNOSIS — M542 Cervicalgia: Secondary | ICD-10-CM | POA: Diagnosis not present

## 2021-02-17 DIAGNOSIS — R29898 Other symptoms and signs involving the musculoskeletal system: Secondary | ICD-10-CM | POA: Diagnosis not present

## 2021-02-19 ENCOUNTER — Other Ambulatory Visit: Payer: Self-pay

## 2021-02-19 ENCOUNTER — Ambulatory Visit: Payer: Medicare PPO | Attending: Physical Medicine and Rehabilitation

## 2021-02-19 DIAGNOSIS — R278 Other lack of coordination: Secondary | ICD-10-CM | POA: Insufficient documentation

## 2021-02-19 DIAGNOSIS — R29818 Other symptoms and signs involving the nervous system: Secondary | ICD-10-CM | POA: Diagnosis not present

## 2021-02-19 DIAGNOSIS — M6281 Muscle weakness (generalized): Secondary | ICD-10-CM

## 2021-02-19 DIAGNOSIS — Z7409 Other reduced mobility: Secondary | ICD-10-CM | POA: Diagnosis not present

## 2021-02-19 DIAGNOSIS — G8254 Quadriplegia, C5-C7 incomplete: Secondary | ICD-10-CM | POA: Diagnosis not present

## 2021-02-19 DIAGNOSIS — G8250 Quadriplegia, unspecified: Secondary | ICD-10-CM | POA: Diagnosis not present

## 2021-02-19 DIAGNOSIS — G8253 Quadriplegia, C5-C7 complete: Secondary | ICD-10-CM | POA: Insufficient documentation

## 2021-02-19 DIAGNOSIS — R2689 Other abnormalities of gait and mobility: Secondary | ICD-10-CM

## 2021-02-19 DIAGNOSIS — R208 Other disturbances of skin sensation: Secondary | ICD-10-CM | POA: Diagnosis not present

## 2021-02-19 DIAGNOSIS — R293 Abnormal posture: Secondary | ICD-10-CM

## 2021-02-19 DIAGNOSIS — R29898 Other symptoms and signs involving the musculoskeletal system: Secondary | ICD-10-CM | POA: Diagnosis not present

## 2021-02-20 ENCOUNTER — Ambulatory Visit: Payer: Medicare PPO
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
## 2021-02-21 ENCOUNTER — Telehealth: Payer: Self-pay

## 2021-02-21 NOTE — Telephone Encounter (Signed)
Dr. Unk Lightning, [name] was evaluated by PT on 02/19/21.  The patient would benefit from OT evaluation for UE strengthening and ADL training for incomplete SCI.   If you agree, please place an order in [facility] workque in [facility] or fax the order to [phone]. Thank you, Cherly Anderson, PT, DPT, [facility] [address] [place], [place]  [address] Phone:  [phone] Fax:  [phone]

## 2021-02-24 ENCOUNTER — Ambulatory Visit: Payer: Medicare PPO | Admitting: Physical Therapy

## 2021-02-24 ENCOUNTER — Other Ambulatory Visit: Payer: Self-pay

## 2021-02-24 ENCOUNTER — Ambulatory Visit (INDEPENDENT_AMBULATORY_CARE_PROVIDER_SITE_OTHER): Payer: Medicare PPO

## 2021-02-24 DIAGNOSIS — M6281 Muscle weakness (generalized): Secondary | ICD-10-CM | POA: Diagnosis not present

## 2021-02-24 DIAGNOSIS — R293 Abnormal posture: Secondary | ICD-10-CM

## 2021-02-24 DIAGNOSIS — G8254 Quadriplegia, C5-C7 incomplete: Secondary | ICD-10-CM | POA: Diagnosis not present

## 2021-02-24 DIAGNOSIS — Z0000 Encounter for general adult medical examination without abnormal findings: Secondary | ICD-10-CM

## 2021-02-24 DIAGNOSIS — R2689 Other abnormalities of gait and mobility: Secondary | ICD-10-CM

## 2021-02-24 DIAGNOSIS — G8253 Quadriplegia, C5-C7 complete: Secondary | ICD-10-CM | POA: Diagnosis not present

## 2021-02-24 DIAGNOSIS — R278 Other lack of coordination: Secondary | ICD-10-CM | POA: Diagnosis not present

## 2021-02-24 DIAGNOSIS — R29818 Other symptoms and signs involving the nervous system: Secondary | ICD-10-CM | POA: Diagnosis not present

## 2021-02-24 DIAGNOSIS — R208 Other disturbances of skin sensation: Secondary | ICD-10-CM | POA: Diagnosis not present

## 2021-02-24 NOTE — Patient Instructions (Signed)
Ms. Fremin , Thank you for taking time to come for your Medicare Wellness Visit. I appreciate your ongoing commitment to your health goals. Please review the following plan we discussed and let me know if I can assist you in the future.   Screening recommendations/referrals: Colonoscopy: postponed until 2023 Mammogram: Done 01/03/20 repeat every year Bone Density: Done 12/07/18 repeat every 2 years Recommended yearly ophthalmology/optometry visit for glaucoma screening and checkup Recommended yearly dental visit for hygiene and checkup  Vaccinations: Influenza vaccine: pt will call with dates  Pneumococcal vaccine: Completed  Tdap vaccine: Due Shingles vaccine: Completed 05/28/18 & 11/29/18   Covid-19:Completed pt will call with dates 10/03/20  Advanced directives: Please bring a copy of your health care power of attorney and living will to the office at your convenience.  Conditions/risks identified: Utilize as much mobility as possible and to be able to stand up again  Next appointment: Follow up in one year for your annual wellness visit    Preventive Care 65 Years and Older, Female Preventive care refers to lifestyle choices and visits with your health care provider that can promote health and wellness. What does preventive care include? A yearly physical exam. This is also called an annual well check. Dental exams once or twice a year. Routine eye exams. Ask your health care provider how often you should have your eyes checked. Personal lifestyle choices, including: Daily care of your teeth and gums. Regular physical activity. Eating a healthy diet. Avoiding tobacco and drug use. Limiting alcohol use. Practicing safe sex. Taking low-dose aspirin every day. Taking vitamin and mineral supplements as recommended by your health care provider. What happens during an annual well check? The services and screenings done by your health care provider during your annual well check will  depend on your age, overall health, lifestyle risk factors, and family history of disease. Counseling  Your health care provider may ask you questions about your: Alcohol use. Tobacco use. Drug use. Emotional well-being. Home and relationship well-being. Sexual activity. Eating habits. History of falls. Memory and ability to understand (cognition). Work and work Statistician. Reproductive health. Screening  You may have the following tests or measurements: Height, weight, and BMI. Blood pressure. Lipid and cholesterol levels. These may be checked every 5 years, or more frequently if you are over 59 years old. Skin check. Lung cancer screening. You may have this screening every year starting at age 51 if you have a 30-pack-year history of smoking and currently smoke or have quit within the past 15 years. Fecal occult blood test (FOBT) of the stool. You may have this test every year starting at age 50. Flexible sigmoidoscopy or colonoscopy. You may have a sigmoidoscopy every 5 years or a colonoscopy every 10 years starting at age 80. Hepatitis C blood test. Hepatitis B blood test. Sexually transmitted disease (STD) testing. Diabetes screening. This is done by checking your blood sugar (glucose) after you have not eaten for a while (fasting). You may have this done every 1-3 years. Bone density scan. This is done to screen for osteoporosis. You may have this done starting at age 42. Mammogram. This may be done every 1-2 years. Talk to your health care provider about how often you should have regular mammograms. Talk with your health care provider about your test results, treatment options, and if necessary, the need for more tests. Vaccines  Your health care provider may recommend certain vaccines, such as: Influenza vaccine. This is recommended every year. Tetanus, diphtheria, and  acellular pertussis (Tdap, Td) vaccine. You may need a Td booster every 10 years. Zoster vaccine. You may  need this after age 51. Pneumococcal 13-valent conjugate (PCV13) vaccine. One dose is recommended after age 65. Pneumococcal polysaccharide (PPSV23) vaccine. One dose is recommended after age 72. Talk to your health care provider about which screenings and vaccines you need and how often you need them. This information is not intended to replace advice given to you by your health care provider. Make sure you discuss any questions you have with your health care provider. Document Released: 06/27/2015 Document Revised: 02/18/2016 Document Reviewed: 04/01/2015 Elsevier Interactive Patient Education  2017 [place] Prevention in the Home Falls can cause injuries. They can happen to people of all ages. There are many things you can do to make your home safe and to help prevent falls. What can I do on the outside of my home? Regularly fix the edges of walkways and driveways and fix any cracks. Remove anything that might make you trip as you walk through a door, such as a raised step or threshold. Trim any bushes or trees on the path to your home. Use bright outdoor lighting. Clear any walking paths of anything that might make someone trip, such as rocks or tools. Regularly check to see if handrails are loose or broken. Make sure that both sides of any steps have handrails. Any raised decks and porches should have guardrails on the edges. Have any leaves, snow, or ice cleared regularly. Use sand or salt on walking paths during winter. Clean up any spills in your garage right away. This includes oil or grease spills. What can I do in the bathroom? Use night lights. Install grab bars by the toilet and in the tub and shower. Do not use towel bars as grab bars. Use non-skid mats or decals in the tub or shower. If you need to sit down in the shower, use a plastic, non-slip stool. Keep the floor dry. Clean up any water that spills on the floor as soon as it happens. Remove soap buildup in  the tub or shower regularly. Attach bath mats securely with double-sided non-slip rug tape. Do not have throw rugs and other things on the floor that can make you trip. What can I do in the bedroom? Use night lights. Make sure that you have a light by your bed that is easy to reach. Do not use any sheets or blankets that are too big for your bed. They should not hang down onto the floor. Have a firm chair that has side arms. You can use this for support while you get dressed. Do not have throw rugs and other things on the floor that can make you trip. What can I do in the kitchen? Clean up any spills right away. Avoid walking on wet floors. Keep items that you use a lot in easy-to-reach places. If you need to reach something above you, use a strong step stool that has a grab bar. Keep electrical cords out of the way. Do not use floor polish or wax that makes floors slippery. If you must use wax, use non-skid floor wax. Do not have throw rugs and other things on the floor that can make you trip. What can I do with my stairs? Do not leave any items on the stairs. Make sure that there are handrails on both sides of the stairs and use them. Fix handrails that are broken or loose. Make sure that  handrails are as long as the stairways. Check any carpeting to make sure that it is firmly attached to the stairs. Fix any carpet that is loose or worn. Avoid having throw rugs at the top or bottom of the stairs. If you do have throw rugs, attach them to the floor with carpet tape. Make sure that you have a light switch at the top of the stairs and the bottom of the stairs. If you do not have them, ask someone to add them for you. What else can I do to help prevent falls? Wear shoes that: Do not have high heels. Have rubber bottoms. Are comfortable and fit you well. Are closed at the toe. Do not wear sandals. If you use a stepladder: Make sure that it is fully opened. Do not climb a closed  stepladder. Make sure that both sides of the stepladder are locked into place. Ask someone to hold it for you, if possible. Clearly mark and make sure that you can see: Any grab bars or handrails. First and last steps. Where the edge of each step is. Use tools that help you move around (mobility aids) if they are needed. These include: Canes. Walkers. Scooters. Crutches. Turn on the lights when you go into a dark area. Replace any light bulbs as soon as they burn out. Set up your furniture so you have a clear path. Avoid moving your furniture around. If any of your floors are uneven, fix them. If there are any pets around you, be aware of where they are. Review your medicines with your doctor. Some medicines can make you feel dizzy. This can increase your chance of falling. Ask your doctor what other things that you can do to help prevent falls. This information is not intended to replace advice given to you by your health care provider. Make sure you discuss any questions you have with your health care provider. Document Released: 03/27/2009 Document Revised: 11/06/2015 Document Reviewed: 07/05/2014 Elsevier Interactive Patient Education  2017 [employer].

## 2021-02-24 NOTE — Progress Notes (Signed)
Virtual Visit via Telephone Note  I connected with  [name] on 02/24/21 at  2:30 PM EDT by telephone and verified that I am speaking with the correct person using two identifiers.  Location: Patient: home Provider: office Persons participating in the virtual visit: patient/Nurse Health Advisor   I discussed the limitations, risks, security and privacy concerns of performing an evaluation and management service by telephone and the availability of in person appointments. The patient expressed understanding and agreed to proceed.  Interactive audio and video telecommunications were attempted between this nurse and patient, however failed, due to patient having technical difficulties OR patient did not have access to video capability.  We continued and completed visit with audio only.  Some vital signs may be absent or patient reported.   Willette Brace, LPN   Subjective:   [name] is a 70 y.o. female who presents for Medicare Annual (Subsequent) preventive examination.  Review of Systems     Cardiac Risk Factors include: advanced age (>54mn, >>16women);dyslipidemia     Objective:    There were no vitals filed for this visit. There is no height or weight on file to calculate BMI.  Advanced Directives 02/24/2021 02/19/2021  Does Patient Have a Medical Advance Directive? Yes Yes  Type of AParamedicof [place] will [place]in Chart? No - copy requested -    Current Medications (verified) Outpatient Encounter Medications as of 02/24/2021  Medication Sig   ascorbic acid (VITAMIN C) 1000 MG tablet Take by mouth 2 (two) times daily.   aspirin 81 MG chewable tablet Chew by mouth.   atorvastatin (LIPITOR) 20 MG tablet Take 1 tablet (20 mg total) by mouth daily.   baclofen (LIORESAL) 20 MG tablet Take 1 tablet (20 mg total) by mouth daily at 2 PM. (Patient taking  differently: Take 20 mg by mouth 3 (three) times daily. Also takes 1 extra throughout the day for '80mg'$  total)   cephALEXin (KEFLEX) 500 MG capsule Take 1 capsule (500 mg total) by mouth 2 (two) times daily. (Patient not taking: Reported on 02/19/2021)   cholecalciferol (VITAMIN D3) 25 MCG (1000 UNIT) tablet Take 1,000 Units by mouth daily. 2000u   Cranberry 200 MG CAPS Take by mouth.   [employer] COENZYME Q-10 100 MG capsule Take 100 mg by mouth daily.    Docusate Sodium (DSS) 100 MG CAPS Take by mouth.   gabapentin (NEURONTIN) 600 MG tablet Take 1,200 mg by mouth 3 (three) times daily.   metroNIDAZOLE (METROGEL) 1 % gel APPLY TOPICALLY EVERY DAY   mirabegron ER (MYRBETRIQ) 25 MG TB24 tablet Take 50 mg by mouth.   Multiple Vitamins-Calcium (VIACTIV MULTI-VITAMIN) CHEW Chew by mouth. (Patient not taking: Reported on 02/19/2021)   Multiple Vitamins-Minerals (CENTRUM SILVER ULTRA WOMENS PO) Take by mouth.   mupirocin ointment (BACTROBAN) 2 % Apply 1 application topically 2 (two) times daily.   nortriptyline (PAMELOR) 25 MG capsule Take 50 mg by mouth.   oxyCODONE (OXY IR/ROXICODONE) 5 MG immediate release tablet Take by mouth.   tizanidine (ZANAFLEX) 2 MG capsule Take 2 mg by mouth daily.   urea (CARMOL) 40 % CREA Apply daily to callused areas of feet   zolpidem (AMBIEN) 10 MG tablet TAKE 1/2-1 TABLET BY MOUTH AT BEDTIME AS NEEDED FOR SLEEP   No facility-administered encounter medications on file as of 02/24/2021.    Allergies (verified) Patient has no known allergies.  History: Past Medical History:  Diagnosis Date   CERVICAL POLYP 03/11/2008   Qualifier: Diagnosis of  By: Regis Bill MD, Standley Brooking    Colon polyps 2005   on colonscopy Dr. Fuller Plan   Fibroid 2004   Per Dr. Ouida Sills   History of shingles    face and mouth   Hx of skin cancer, basal cell    Rosacea    Sciatica of left side 09/28/2013   Scoliosis    noted on mri done for back pain   Past Surgical History:  Procedure Laterality Date    BUNIONECTOMY     Family History  Problem Relation Age of Onset   Hypertension Father    Osteoporosis Other    Breast cancer Neg Hx    Social History   Socioeconomic History   Marital status: Married    Spouse name: Not on file   Number of children: Not on file   Years of education: Not on file   Highest education level: Not on file  Occupational History   Not on file  Tobacco Use   Smoking status: Never   Smokeless tobacco: Never  Vaping Use   Vaping Use: Never used  Substance and Sexual Activity   Alcohol use: Yes   Drug use: Not on file   Sexual activity: Not on file  Other Topics Concern   Not on file  Social History Narrative   Married   Spouse had CABG    [employer]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   Has moved to [place]   hh of 2    2 cats      Social Determinants of Radio broadcast assistant Strain: Low Risk    Difficulty of Paying Living Expenses: Not hard at all  Food Insecurity: No Food Insecurity   Worried About Charity fundraiser in the Last Year: Never true   Arboriculturist in the Last Year: Never true  Transportation Needs: No Transportation Needs   Lack of Transportation (Medical): No   Lack of Transportation (Non-Medical): No  Physical Activity: Inactive   Days of Exercise per Week: 0 days   Minutes of Exercise per Session: 0 min  Stress: No Stress Concern Present   Feeling of Stress : Not at all  Social Connections: Moderately Integrated   Frequency of Communication with Friends and Family: More than three times a week   Frequency of Social Gatherings with Friends and Family: More than three times a week   Attends Religious Services: Never   Marine scientist or Organizations: Yes   Attends Music therapist: 1 to 4 times per year   Marital Status: Married    Tobacco Counseling Counseling given: Not Answered   Clinical Intake:  Pre-visit preparation completed: Yes  Pain : 0-10 Pain Type: Chronic pain Pain  Location: Arm (lower arm and hands) Pain Descriptors / Indicators: Burning Pain Onset: More than a month ago Pain Frequency: Constant     BMI - recorded: 23.2 Nutritional Status: BMI of 19-24  Normal Nutritional Risks: None Diabetes: No  How often do you need to have someone help you when you read instructions, pamphlets, or other written materials from your doctor or pharmacy?: 1 - Never  Diabetic?No  Interpreter Needed?: No  Information entered by :: Charlott Rakes, LPN   Activities of Daily Living In your present state of health, do you have any difficulty performing the following activities: 02/24/2021  Hearing? N  Vision? N  Difficulty concentrating or making decisions? N  Walking or climbing stairs? Y  Comment unable to stand  Dressing or bathing? Y  Comment has assitance  Doing errands, shopping? Y  Comment has Agricultural engineer and eating ? Y  Comment has assistance  Using the Toilet? Y  Managing your Medications? N  Managing your Finances? N  Housekeeping or managing your Housekeeping? Y  Some recent data might be hidden    Patient Care Team: Panosh, Standley Brooking, MD as PCP - General Olga Millers, MD (Obstetrics and Gynecology) Ladene Artist, MD (Gastroenterology) Dia Crawford, Jess Barters, MD (Inactive)  Indicate any recent Medical Services you may have received from other than [facility] providers in the past year (date may be approximate).     Assessment:   This is a routine wellness examination for [name].  Hearing/Vision screen Hearing Screening - Comments:: Pt denies any hearing issues  Vision Screening - Comments:: Pt follows up with [facility] every other year  Dietary issues and exercise activities discussed: Current Exercise Habits: The patient does not participate in regular exercise at present   Goals Addressed             This Visit's Progress    Patient Stated       Vennie Homans as much mobility as possible and to be able to  stand up again       Depression Screen PHQ 2/9 Scores 02/24/2021 12/10/2019 04/14/2017 09/28/2013  PHQ - 2 Score 0 0 0 0  PHQ- 9 Score - 0 - -    Fall Risk Fall Risk  02/24/2021 12/10/2019 04/14/2017  Falls in the past year? 0 0 No  Number falls in past yr: 0 - -  Injury with Fall? 0 - -  Risk for fall due to : Impaired vision;Impaired mobility - -  Follow up Falls prevention discussed - -    FALL RISK PREVENTION PERTAINING TO THE HOME:   Home free of loose throw rugs in walkways, pet beds, electrical cords, etc? Yes  Adequate lighting in your home to reduce risk of falls? Yes   ASSISTIVE DEVICES UTILIZED TO PREVENT FALLS:  Life alert? No  Use of a cane, walker or w/c? No  TIMED UP AND GO:  Was the test performed? No .  Cognitive Function: declined         Immunizations Immunization History  Administered Date(s) Administered   Influenza Inj Mdck Quad Pf 06/25/2016   Influenza Split 03/21/2012   Influenza Whole 03/11/2008   Influenza, High Dose Seasonal PF 04/14/2017, 05/28/2018, 03/10/2020   Influenza-Unspecified 05/28/2018   PFIZER(Purple Top)SARS-COV-2 Vaccination 10/03/2020   Pneumococcal Conjugate-13 04/14/2017   Pneumococcal Polysaccharide-23 12/18/2018   Td 06/28/2006   Typhoid Live 11/29/2014   Zoster Recombinat (Shingrix) 05/28/2018, 11/29/2018   Zoster, Live 03/21/2012    TDAP status: Due, Education has been provided regarding the importance of this vaccine. Advised may receive this vaccine at local pharmacy or Health Dept. Aware to provide a copy of the vaccination record if obtained from local pharmacy or Health Dept. Verbalized acceptance and understanding.  Flu Vaccine status: Up to date pt will call in dates   Pneumococcal vaccine status: Up to date  Covid-19 vaccine status: Completed vaccines  Qualifies for Shingles Vaccine? Yes   Zostavax completed Yes   Shingrix Completed?: Yes  Screening Tests Health Maintenance  Topic Date Due    TETANUS/TDAP  06/28/2016   COVID-19 Vaccine (2 -  Pfizer risk series) 10/24/2020   INFLUENZA VACCINE  01/12/2021   COLONOSCOPY (Pts 45-56yr Insurance coverage will need to be confirmed)  06/10/2022 (Originally 12/23/2013)   MAMMOGRAM  01/02/2022   DEXA SCAN  Completed   Hepatitis C Screening  Completed   PNA vac Low Risk Adult  Completed   Zoster Vaccines- Shingrix  Completed   HPV VACCINES  Aged Out    Health Maintenance  Health Maintenance Due  Topic Date Due   TETANUS/TDAP  06/28/2016   COVID-19 Vaccine (2 - Pfizer risk series) 10/24/2020   INFLUENZA VACCINE  01/12/2021    Colorectal cancer screening: Type of screening: Colonoscopy. Completed 12/24/03. Repeat every 10 years postponed until 06/10/22  Mammogram status: Completed 01/03/20. Repeat every year  Bone Density status: Completed 12/07/18. Results reflect: Bone density results: OSTEOPOROSIS. Repeat every 2 years.  Additional Screening:  Hepatitis C Screening:  Completed 12/07/18  Vision Screening: Recommended annual ophthalmology exams for early detection of glaucoma and other disorders of the eye. Is the patient up to date with their annual eye exam?  No  Who is the provider or what is the name of the office in which the patient attends annual eye exams? [facility]  If pt is not established with a provider, would they like to be referred to a provider to establish care? No .   Dental Screening: Recommended annual dental exams for proper oral hygiene  Community Resource Referral / Chronic Care Management: CRR required this visit?  No   CCM required this visit?  No      Plan:     I have personally reviewed and noted the following in the patient's chart:   Medical and social history Use of alcohol, tobacco or illicit drugs  Current medications and supplements including opioid prescriptions.  Functional ability and status Nutritional status Physical activity Advanced directives List of other  physicians Hospitalizations, surgeries, and ER visits in previous 12 months Vitals Screenings to include cognitive, depression, and falls Referrals and appointments  In addition, I have reviewed and discussed with patient certain preventive protocols, quality metrics, and best practice recommendations. A written personalized care plan for preventive services as well as general preventive health recommendations were provided to patient.     TWillette Brace LPN   [phone]  Nurse Notes: None

## 2021-02-25 NOTE — Patient Instructions (Signed)
Access Code: [email or web address] URL: [email or web address] Date: 02/25/2021 Prepared by: Misty Stanley  Exercises Supine Hip Flexion - 1 x daily - 7 x weekly - 1 sets - 10 reps Bent Knee Fallouts - 1 x daily - 7 x weekly - 2 sets - 10 reps Supine Quadricep Sets - 1 x daily - 7 x weekly - 2 sets - 10 reps Clamshell (Mirrored) - 1 x daily - 7 x weekly - 2 sets - 10 reps Sidelying Knee Flexion and Extension in Abduction - 1 x daily - 7 x weekly - 1 sets - 10 reps

## 2021-02-25 NOTE — Therapy (Signed)
[facility] [address] [place], [place], [address] Phone: [phone]   Fax:  [phone]  Physical Therapy Treatment  Patient Details  Name: [name] MRN: [id number] Date of Birth: 1950-08-01 Referring Provider (PT): Landis Gandy   Encounter Date: 02/24/2021   PT End of Session - 02/24/21 1324     Visit Number 2    Number of Visits 25    Date for PT Re-Evaluation 05/15/21    Authorization Type humana medicare    PT Start Time [id number]    PT Stop Time 1415    PT Time Calculation (min) 52 min    Equipment Utilized During Treatment --   slideboard   Activity Tolerance Patient tolerated treatment well    Behavior During Therapy [facility] for tasks assessed/performed             Past Medical History:  Diagnosis Date   CERVICAL POLYP 03/11/2008   Qualifier: Diagnosis of  By: Regis Bill MD, Standley Brooking    Colon polyps 2005   on colonscopy Dr. Fuller Plan   Fibroid 2004   Per Dr. Ouida Sills   History of shingles    face and mouth   Hx of skin cancer, basal cell    Rosacea    Sciatica of left side 09/28/2013   Scoliosis    noted on mri done for back pain    Past Surgical History:  Procedure Laterality Date   BUNIONECTOMY      There were no vitals filed for this visit.   Subjective Assessment - 02/24/21 1325     Subjective Nothing new to report - just normal neuropathic pain.  BP has been good even in standing frame.  Does not have a specific set of exercises she is performing, most of the exercises on [facility] website require a caregiver. Pt would like exercises to perform that don't require a caregiver if time does not allow caregiver to assist.  Husband inquiring about mat table for pt to use at home.  Pt and husband inquiring about referral for OT.    Patient is accompained by: Family member   husband, Darrick Penna   Pertinent History Pt also takes Toviaz '4mg'$  daily. PMH: hyperlipidemia, scoliosis, sleep disorder     Patient Stated Goals Pt would like to be able to walk even if its with assistance. She also wants to be able to type and improve her ability to do ADLs to allow for more independence.    Currently in Pain? Yes    Pain Onset More than a month ago                [facility] Adult PT Treatment/Exercise - 02/25/21 1002       Bed Mobility   Bed Mobility Rolling Left;Left Sidelying to Sit;Sit to Supine    Rolling Left Moderate Assistance - Patient 50-74%    Left Sidelying to Sit Moderate Assistance - Patient 50-74%    Sit to Supine Moderate Assistance - Patient 50-74%   lift LE; pt able to adjust upper body to midline     Transfers   Transfers Lateral/Scoot Transfers    Lateral/Scoot Transfers 3: Mod assist;4: Min assist;With slide board    Lateral/Scoot Transfer Details (indicate cue type and reason) Reviewed lateral leaning to unweight and scoot hips forwards away from back of wheelchair and dip in cushion.  Initially required assistance to unweight hips to start scoot across board.  Once on board pt able to perform with  min A when going to L and back to R downhill.  When going back to w/c, therapist required to block knees and prevent sliding forwards off board due to too high for feet to touch floor. Would benefit from use of block under feet next session.      Exercises   Exercises Other Exercises    Other Exercises  Reviewed the following exercises in supine and sidelying below.  Some exercises attempted in supine but would require extra assistance; utilized sidelying for gravity minimized positioning.  RLE only noted to have trace contraction.             Access Code: [email or web address] URL: [email or web address] Date: 02/25/2021 Prepared by: Misty Stanley  Exercises Supine Hip Flexion - 1 x daily - 7 x weekly - 1 sets - 10 reps Bent Knee Fallouts - 1 x daily - 7 x weekly - 2 sets - 10 reps Supine Quadricep Sets - 1 x daily - 7 x weekly - 2 sets - 10 reps Clamshell  (Mirrored) - 1 x daily - 7 x weekly - 2 sets - 10 reps Sidelying Knee Flexion and Extension in Abduction - 1 x daily - 7 x weekly - 1 sets - 10 reps     PT Education - 02/25/21 1000     Education Details PT to follow up on OT referral and mat table information; rationale for slideboard placement, initial supine and sidelying HEP    Person(s) Educated Patient;Spouse    Methods Explanation;Demonstration;Handout    Comprehension Verbalized understanding;Returned demonstration              PT Short Term Goals - 02/20/21 1030       PT SHORT TERM GOAL #1   Title Pt will be able to perform initial HEP for stretching and strengthening with caregiver.    Time 4    Period Weeks    Status New    Target Date 03/20/21      PT SHORT TERM GOAL #2   Title Pt will be able to perform slideboard transfer on level surface supervision after no more than min assist to help with board placement.    Time 4    Period Weeks    Target Date 03/20/21      PT SHORT TERM GOAL #3   Title Pt will be able to perform rolling to each side supervision for improved bed mobility.    Time 4    Period Weeks    Status New    Target Date 03/20/21      PT SHORT TERM GOAL #4   Title Pt will be able to perform sit to/from supine transfer mod assist for improved bed mobility.    Time 4    Period Weeks    Status New    Target Date 03/20/21      PT SHORT TERM GOAL #5   Title Pt will be able to maintain sitting balance edge of mat with only feet supported x 5 min while performing UE movements for improved sitting balance/trunk stability to assist with sitting ADLs.    Time 4    Period Weeks    Status New    Target Date 03/20/21               PT Long Term Goals - 02/20/21 1035       PT LONG TERM GOAL #1   Title Pt will be able to perform progressive HEP for strengthening, stretching and balance to  continue gains on own. (LTGs due 05/15/21)    Time 12    Period Weeks    Status New    Target Date  05/15/21      PT LONG TERM GOAL #2   Title Pt will be able to perform squat pivot transfer/lateral scoot transfer without slideboard CGA for improved mobility.    Time 12    Period Weeks    Status New    Target Date 05/15/21      PT LONG TERM GOAL #3   Title Pt will be able to stand at counter x 2 min min assist for improved standing ability.    Time 12    Period Weeks    Status New    Target Date 05/15/21      PT LONG TERM GOAL #4   Title Pt will be able to perform all bed mobility CGA for improved function.    Time 12    Period Weeks    Status New    Target Date 05/15/21      PT LONG TERM GOAL #5   Title Pt will report being able to perform 20 minutes of manual w/c propulsion around home for improved UE strength and mobility. (should be getting manual chair soon)    Time 12    Period Weeks    Status New    Target Date 05/15/21                   Plan - 02/25/21 0954     Clinical Impression Statement Referral for OT has not been entered by physician.  Husband also continues to inquire about mat table for home.  PT to follow up.  Treatment session focused on continued slideboard transfer training and initiation of supine and sidelying HEP for LE strengthening and mm activation; pt requested exercises she could perform without a caregiver if possible at home.  Will continue to review, revise and add to HEP next session to focus on LE PROM.    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navy officer Evolving/Moderate complexity    Rehab Potential Good    PT Frequency 2x / week   plus eval   PT Duration 12 weeks    PT Treatment/Interventions ADLs/Self Care Home Management;Electrical Stimulation;DME  Instruction;Neuromuscular re-education;Manual techniques;Therapeutic exercise;Balance training;Therapeutic activities;Cryotherapy;Moist Heat;Functional mobility training;Stair training;Gait training;Patient/family education;Orthotic Fit/Training;Wheelchair mobility training;Dry needling;Passive range of motion;Vestibular    PT Next Visit Plan Has OT order come back from referring physician?  Any information on mat table?  How are initial LE strengthening exercises - able to do at home?  If not, please revise and add hamstring and gastroc stretches to HEP.  Slideboard transfer training, bed mobility especially side > sit.    Consulted and Agree with Plan of Care Patient;Family member/caregiver    Family Member Consulted husband, [name]             Patient will benefit from skilled therapeutic intervention in order to improve the following deficits and impairments:  Decreased balance, Decreased mobility, Decreased strength, Impaired sensation, Postural dysfunction, Impaired flexibility, Impaired UE functional use, Impaired tone, Decreased range of motion  Visit Diagnosis: Muscle weakness (generalized)  Abnormal posture  Quadriplegia, C5-C7 incomplete (HCC)  Other abnormalities of gait and mobility     Problem List Patient Active Problem List  Diagnosis Date Noted   Quadriplegia, C5-C7 incomplete ([place]) 01/16/2021   History of spinal fracture 01/16/2021   Suprapubic catheter ([place]) 01/16/2021   Encounter for routine gynecological examination 09/28/2013   Onychomycosis 09/28/2013   Foot deformity, acquired 03/26/2012   Encounter for preventive health examination 12/25/2010   ROSACEA 08/25/2009   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

## 2021-02-26 ENCOUNTER — Ambulatory Visit: Payer: Medicare PPO

## 2021-02-26 ENCOUNTER — Other Ambulatory Visit: Payer: Self-pay

## 2021-02-26 DIAGNOSIS — G8253 Quadriplegia, C5-C7 complete: Secondary | ICD-10-CM | POA: Diagnosis not present

## 2021-02-26 DIAGNOSIS — R29818 Other symptoms and signs involving the nervous system: Secondary | ICD-10-CM | POA: Diagnosis not present

## 2021-02-26 DIAGNOSIS — R293 Abnormal posture: Secondary | ICD-10-CM | POA: Diagnosis not present

## 2021-02-26 DIAGNOSIS — G8254 Quadriplegia, C5-C7 incomplete: Secondary | ICD-10-CM

## 2021-02-26 DIAGNOSIS — R208 Other disturbances of skin sensation: Secondary | ICD-10-CM | POA: Diagnosis not present

## 2021-02-26 DIAGNOSIS — R2689 Other abnormalities of gait and mobility: Secondary | ICD-10-CM | POA: Diagnosis not present

## 2021-02-26 DIAGNOSIS — R278 Other lack of coordination: Secondary | ICD-10-CM | POA: Diagnosis not present

## 2021-02-26 DIAGNOSIS — M6281 Muscle weakness (generalized): Secondary | ICD-10-CM | POA: Diagnosis not present

## 2021-02-26 NOTE — Patient Instructions (Signed)
Access Code: [email or web address] URL: [email or web address] Date: 02/26/2021 Prepared by: Cherly Anderson  Exercises Supine Hip Flexion - 1 x daily - 7 x weekly - 1 sets - 10 reps Bent Knee Fallouts - 1 x daily - 7 x weekly - 2 sets - 10 reps Supine Quadricep Sets - 1 x daily - 7 x weekly - 2 sets - 10 reps Clamshell (Mirrored) - 1 x daily - 7 x weekly - 2 sets - 10 reps Sidelying Knee Flexion and Extension in Abduction - 1 x daily - 7 x weekly - 1 sets - 10 reps Supine Hamstring Stretch with Caregiver - 3-4 x daily - 7 x weekly - 1 sets - 4 reps - 1 min hold Supine Hip Internal Rotation PROM with Caregiver - 3-4 x daily - 7 x weekly - 1 sets - 4 reps - 1 min hold Supine Hip External Rotation PROM with Caregiver - 3-4 x daily - 7 x weekly - 1 sets - 4 reps - 1 min hold Supine Ankle Dorsiflexion Stretch with Caregiver - 3-4 x daily - 7 x weekly - 1 sets - 4 reps - 1 min hold  Verbally discussed after performing:  leaning back towards reclined seat back and coming back up to work on core. Leaning back on to arm with elbow flexion then straightening elbow to work on triceps with body weight.

## 2021-02-26 NOTE — Therapy (Addendum)
[facility] [address] [place], [place], [address] Phone: [phone]   Fax:  [phone]  Physical Therapy Treatment  Patient Details  Name: [name] MRN: [id number] Date of Birth: [birth year] Referring Provider (PT): Landis Gandy   Encounter Date: 02/26/2021   PT End of Session - 02/26/21 1449     Visit Number 3    Number of Visits 25    Date for PT Re-Evaluation 05/15/21    Authorization Type humana medicare    PT Start Time 1445    PT Stop Time 1541    PT Time Calculation (min) 56 min    Equipment Utilized During Treatment --   slideboard   Activity Tolerance Patient tolerated treatment well    Behavior During Therapy [facility] for tasks assessed/performed             Past Medical History:  Diagnosis Date   CERVICAL POLYP 03/11/2008   Qualifier: Diagnosis of  By: Regis Bill MD, Standley Brooking    Colon polyps 2005   on colonscopy Dr. Fuller Plan   Fibroid 2004   Per Dr. Ouida Sills   History of shingles    face and mouth   Hx of skin cancer, basal cell    Rosacea    Sciatica of left side 09/28/2013   Scoliosis    noted on mri done for back pain    Past Surgical History:  Procedure Laterality Date   BUNIONECTOMY      There were no vitals filed for this visit.   Subjective Assessment - 02/26/21 1450     Subjective Pt reports that she was able to try the quad sets and left heel slide some. Pt now has full time caregiver who is present with her husband at the session. Pt reports that she got update on manual w/c and will not be ready until November now.    Patient is accompained by: Family member   husband, Darrick Penna   Pertinent History Pt also takes Toviaz '4mg'$  daily. PMH: hyperlipidemia, scoliosis, sleep disorder    Patient Stated Goals Pt would like to be able to walk even if its with assistance. She also wants to be able to type and improve her ability to do ADLs to allow for more independence.     Currently in Pain? Yes    Pain Score 3     Pain Location Arm    Pain Orientation Left;Right    Pain Descriptors / Indicators Burning    Pain Type Neuropathic pain;Chronic pain    Pain Onset More than a month ago    Pain Frequency Constant                               [facility] Adult PT Treatment/Exercise - 02/26/21 1451       Bed Mobility   Bed Mobility Rolling Right;Right Sidelying to Sit;Sit to Supine    Rolling Right Minimal Assistance - Patient > 75%   started with bending up left leg first with min assist to get up fully. Pt instructed to use momentum throwing left arm across body and rocking to get over. Able to get over on 3rd attempt CGA/min assist.   Right Sidelying to Sit Moderate Assistance - Patient 50-74%   PT assisted to get legs off mat with pt able to help some with LLE but full assist on RLE. Then verbal cues to push through right elbow and  left hand to come up min assist.   Sit to Supine Moderate Assistance - Patient 50-74%   PT assisted with legs, mostly right, and pt came back on elbows to scoot around.     Transfers   Transfers Lateral/Scoot Transfers    Lateral/Scoot Transfers 4: Min assist;From elevated surface;With slide board    Lateral/Scoot Transfer Details (indicate cue type and reason) PT assisted to place slideboard in proper place with each transfer with pt able to lean away to lift off bottom. Pt able to remove board herself at end of each transfer. Verbal cues to use head/hips to help with movement which did help. Pt able to perform transfer with only 3-4 scoots with PT adjusting feet as needed with pt directing the movement.    Comments Sitting edge of mat working on scooting to side to get back near w/c with step under feet for support and min assist at pelvis. Pt able to scoot to right easier than to left.      Neuro Re-ed    Neuro Re-ed Details  Sitting edge of mat with feet supported: coming down on forearm to side and reaching across  body then pushing back up through arm x 5 to each side. Leaning back as far as pt could control to come back up using abdominals with PT holding forearms lightly for safety x 10. Sitting back on hands in short sit (with cues to keep right fingers flexed with wrist extended to protect tenodesis) flexing at elbows and then pushing up to extend x 10 with tactile cues not to elevate shoulders.      Exercises   Exercises Other Exercises    Other Exercises  Supine on mat PT performed streching routine as follows with pt's husband and new full time caregiver observing to learn: Hamstring stretching 1 min x 2 bilateral, hip ER and hip IR 1 min x 2 bilateral, gastroc stretch 1 min x 2.                     PT Education - 02/26/21 1919     Education Details Added stretches verbally and will give pictures next time. Also discussed some core/tricep strengthening options.    Person(s) Educated Patient;Caregiver(s);Spouse    Methods Explanation;Demonstration    Comprehension Verbalized understanding              PT Short Term Goals - 02/20/21 1030       PT SHORT TERM GOAL #1   Title Pt will be able to perform initial HEP for stretching and strengthening with caregiver.    Time 4    Period Weeks    Status New    Target Date 03/20/21      PT SHORT TERM GOAL #2   Title Pt will be able to perform slideboard transfer on level surface supervision after no more than min assist to help with board placement.    Time 4    Period Weeks    Target Date 03/20/21      PT SHORT TERM GOAL #3   Title Pt will be able to perform rolling to each side supervision for improved bed mobility.    Time 4    Period Weeks    Status New    Target Date 03/20/21      PT SHORT TERM GOAL #4   Title Pt will be able to perform sit to/from supine transfer mod assist for improved bed mobility.    Time  4    Period Weeks    Status New    Target Date 03/20/21      PT SHORT TERM GOAL #5   Title Pt will be able  to maintain sitting balance edge of mat with only feet supported x 5 min while performing UE movements for improved sitting balance/trunk stability to assist with sitting ADLs.    Time 4    Period Weeks    Status New    Target Date 03/20/21               PT Long Term Goals - 02/20/21 1035       PT LONG TERM GOAL #1   Title Pt will be able to perform progressive HEP for strengthening, stretching and balance to continue gains on own. (LTGs due 05/15/21)    Time 12    Period Weeks    Status New    Target Date 05/15/21      PT LONG TERM GOAL #2   Title Pt will be able to perform squat pivot transfer/lateral scoot transfer without slideboard CGA for improved mobility.    Time 12    Period Weeks    Status New    Target Date 05/15/21      PT LONG TERM GOAL #3   Title Pt will be able to stand at counter x 2 min min assist for improved standing ability.    Time 12    Period Weeks    Status New    Target Date 05/15/21      PT LONG TERM GOAL #4   Title Pt will be able to perform all bed mobility CGA for improved function.    Time 12    Period Weeks    Status New    Target Date 05/15/21      PT LONG TERM GOAL #5   Title Pt will report being able to perform 20 minutes of manual w/c propulsion around home for improved UE strength and mobility. (should be getting manual chair soon)    Time 12    Period Weeks    Status New    Target Date 05/15/21                   Plan - 02/26/21 1920     Clinical Impression Statement Pt required less assistance with slideboard transfer today. Improved scooting with using head/hips movement to help. Pt can move to right easier as stronger on right to push off. Established stretching HEP for BLE.    Personal Factors and Comorbidities Comorbidity 2    Comorbidities scoliosis and sleep disorder    Examination-Activity Limitations Bed Mobility;Locomotion Level;Transfers;Stand;Bathing;Dressing    Examination-Participation Teacher, adult education;Yard Work    Merchant navy officer Evolving/Moderate complexity    Rehab Potential Good    PT Frequency 2x / week   plus eval   PT Duration 12 weeks    PT Treatment/Interventions ADLs/Self Care Home Management;Electrical Stimulation;DME Instruction;Neuromuscular re-education;Manual techniques;Therapeutic exercise;Balance training;Therapeutic activities;Cryotherapy;Moist Heat;Functional mobility training;Stair training;Gait training;Patient/family education;Orthotic Fit/Training;Wheelchair mobility training;Dry needling;Passive range of motion;Vestibular    PT Next Visit Plan Has OT order come back from referring physician?   Issue stretching HEP as ran out of time to give but did add to program.   Slideboard transfer training, bed mobility especially side > sit. Continue to work on functional strengthening for UE, prone position for scapular strengthening.    Consulted and Agree with Plan of Care Patient;Family member/caregiver  Family Member Consulted husband, [name]             Patient will benefit from skilled therapeutic intervention in order to improve the following deficits and impairments:  Decreased balance, Decreased mobility, Decreased strength, Impaired sensation, Postural dysfunction, Impaired flexibility, Impaired UE functional use, Impaired tone, Decreased range of motion  Visit Diagnosis: Muscle weakness (generalized)  Quadriplegia, C5-C7 incomplete ([place])     Problem List Patient Active Problem List   Diagnosis Date Noted   Quadriplegia, C5-C7 incomplete ([place]) 01/16/2021   History of spinal fracture 01/16/2021   Suprapubic catheter ([place]) 01/16/2021   Encounter for routine gynecological examination 09/28/2013   Onychomycosis 09/28/2013   Foot deformity, acquired 03/26/2012   Encounter for preventive health examination 12/25/2010   ROSACEA 08/25/2009   Disturbance in sleep behavior 03/11/2008   SKIN CANCER, HX OF  03/11/2008   DYSURIA, HX OF 03/11/2008   Hyperlipidemia 02/10/2007   CERVICALGIA 02/10/2007    Electa Sniff, PT, DPT, NCS 02/26/2021, 7:28 PM  [facility] [address] [place], [place], [address] Phone: [phone]   Fax:  [phone]  Name: Nazyia Dejardin MRN: [id number] Date of Birth: 06/26/1950

## 2021-02-27 ENCOUNTER — Ambulatory Visit: Payer: Medicare PPO

## 2021-03-02 DIAGNOSIS — Z789 Other specified health status: Secondary | ICD-10-CM | POA: Diagnosis not present

## 2021-03-02 DIAGNOSIS — M542 Cervicalgia: Secondary | ICD-10-CM | POA: Diagnosis not present

## 2021-03-02 DIAGNOSIS — R4189 Other symptoms and signs involving cognitive functions and awareness: Secondary | ICD-10-CM | POA: Diagnosis not present

## 2021-03-02 DIAGNOSIS — R29898 Other symptoms and signs involving the musculoskeletal system: Secondary | ICD-10-CM | POA: Diagnosis not present

## 2021-03-02 DIAGNOSIS — G8250 Quadriplegia, unspecified: Secondary | ICD-10-CM | POA: Diagnosis not present

## 2021-03-02 DIAGNOSIS — R279 Unspecified lack of coordination: Secondary | ICD-10-CM | POA: Diagnosis not present

## 2021-03-02 DIAGNOSIS — G8254 Quadriplegia, C5-C7 incomplete: Secondary | ICD-10-CM | POA: Diagnosis not present

## 2021-03-04 ENCOUNTER — Other Ambulatory Visit: Payer: Self-pay

## 2021-03-04 ENCOUNTER — Ambulatory Visit: Payer: Medicare PPO

## 2021-03-04 ENCOUNTER — Telehealth: Payer: Self-pay

## 2021-03-04 DIAGNOSIS — R208 Other disturbances of skin sensation: Secondary | ICD-10-CM | POA: Diagnosis not present

## 2021-03-04 DIAGNOSIS — G8253 Quadriplegia, C5-C7 complete: Secondary | ICD-10-CM | POA: Diagnosis not present

## 2021-03-04 DIAGNOSIS — G8254 Quadriplegia, C5-C7 incomplete: Secondary | ICD-10-CM | POA: Diagnosis not present

## 2021-03-04 DIAGNOSIS — M6281 Muscle weakness (generalized): Secondary | ICD-10-CM

## 2021-03-04 DIAGNOSIS — R29818 Other symptoms and signs involving the nervous system: Secondary | ICD-10-CM | POA: Diagnosis not present

## 2021-03-04 DIAGNOSIS — R278 Other lack of coordination: Secondary | ICD-10-CM | POA: Diagnosis not present

## 2021-03-04 DIAGNOSIS — R2689 Other abnormalities of gait and mobility: Secondary | ICD-10-CM | POA: Diagnosis not present

## 2021-03-04 DIAGNOSIS — R293 Abnormal posture: Secondary | ICD-10-CM | POA: Diagnosis not present

## 2021-03-04 NOTE — Telephone Encounter (Signed)
PT called and spoke with Cassandra at [facility] to follow up on OT order request. They do not show request in system but PT did fax 2 weeks ago. She is going to send message to Dr. Unk Lightning to try to get the order. Let our fax number for them. Cherly Anderson, PT, DPT, NCS

## 2021-03-04 NOTE — Patient Instructions (Signed)
Access Code: [email or web address] URL: [email or web address] Date: 03/04/2021 Prepared by: Cherly Anderson  Exercises Supine Hip Flexion - 1 x daily - 7 x weekly - 1 sets - 10 reps Bent Knee Fallouts - 1 x daily - 7 x weekly - 2 sets - 10 reps Supine Quadricep Sets - 1 x daily - 7 x weekly - 2 sets - 10 reps Clamshell (Mirrored) - 1 x daily - 7 x weekly - 2 sets - 10 reps Sidelying Knee Flexion and Extension in Abduction - 1 x daily - 7 x weekly - 1 sets - 10 reps Supine Hamstring Stretch with Caregiver - 3-4 x daily - 7 x weekly - 1 sets - 4 reps - 1 min hold Supine Hip Internal Rotation PROM with Caregiver - 3-4 x daily - 7 x weekly - 1 sets - 4 reps - 1 min hold Supine Hip External Rotation PROM with Caregiver - 3-4 x daily - 7 x weekly - 1 sets - 4 reps - 1 min hold Supine Ankle Dorsiflexion Stretch with Caregiver - 3-4 x daily - 7 x weekly - 1 sets - 4 reps - 1 min hold Scapular Retraction with Resistance - 1 x daily - 7 x weekly - 2 sets - 10 reps

## 2021-03-05 ENCOUNTER — Ambulatory Visit (INDEPENDENT_AMBULATORY_CARE_PROVIDER_SITE_OTHER): Payer: Medicare PPO | Admitting: Podiatry

## 2021-03-05 ENCOUNTER — Ambulatory Visit: Payer: Medicare PPO | Admitting: Podiatry

## 2021-03-05 ENCOUNTER — Ambulatory Visit: Payer: Medicare PPO

## 2021-03-05 DIAGNOSIS — G8254 Quadriplegia, C5-C7 incomplete: Secondary | ICD-10-CM

## 2021-03-05 DIAGNOSIS — R2689 Other abnormalities of gait and mobility: Secondary | ICD-10-CM | POA: Diagnosis not present

## 2021-03-05 DIAGNOSIS — G8253 Quadriplegia, C5-C7 complete: Secondary | ICD-10-CM | POA: Diagnosis not present

## 2021-03-05 DIAGNOSIS — R278 Other lack of coordination: Secondary | ICD-10-CM | POA: Diagnosis not present

## 2021-03-05 DIAGNOSIS — R208 Other disturbances of skin sensation: Secondary | ICD-10-CM | POA: Diagnosis not present

## 2021-03-05 DIAGNOSIS — L89890 Pressure ulcer of other site, unstageable: Secondary | ICD-10-CM | POA: Diagnosis not present

## 2021-03-05 DIAGNOSIS — R293 Abnormal posture: Secondary | ICD-10-CM | POA: Diagnosis not present

## 2021-03-05 DIAGNOSIS — M6281 Muscle weakness (generalized): Secondary | ICD-10-CM

## 2021-03-05 DIAGNOSIS — R29818 Other symptoms and signs involving the nervous system: Secondary | ICD-10-CM | POA: Diagnosis not present

## 2021-03-05 NOTE — Therapy (Signed)
[facility] [address] [place], [place], [address] Phone: [phone]   Fax:  [phone]  Physical Therapy Treatment  Patient Details  Name: [name] MRN: [id number] Date of Birth: [birth year] Referring Provider (PT): Landis Gandy   Encounter Date: 03/05/2021   PT End of Session - 03/05/21 1450     Visit Number 5    Number of Visits 25    Date for PT Re-Evaluation 05/15/21    Authorization Type human[employer] medicare    PT Start Time 1448    PT Stop Time 1535    PT Time Calculation (min) 47 min    Equipment Utilized During Treatment --   slideboard   Activity Tolerance Patient tolerated treatment well    Behavior During Therapy [facility] for tasks assessed/performed             Past Medical History:  Diagnosis Date   CERVICAL POLYP 03/11/2008   Qualifier: Diagnosis of  By: Regis Bill MD, Standley Brooking    Colon polyps 2005   on colonscopy Dr. Fuller Plan   Fibroid 2004   Per Dr. Ouida Sills   History of shingles    face and mouth   Hx of skin cancer, basal cell    Rosacea    Sciatica of left side 09/28/2013   Scoliosis    noted on mri done for back pain    Past Surgical History:  Procedure Laterality Date   BUNIONECTOMY      There were no vitals filed for this visit.   Subjective Assessment - 03/05/21 1450     Subjective Pt denies any changes since yesterday.    Patient is accompained by: Family member   husband, Darrick Penna   Pertinent History Pt also takes Toviaz 4mg  daily. PMH: hyperlipidemia, scoliosis, sleep disorder    Patient Stated Goals Pt would like to be able to walk even if its with assistance. She also wants to be able to type and improve her ability to do ADLs to allow for more independence.    Currently in Pain? Yes    Pain Score 5     Pain Location Arm    Pain Orientation Right;Left    Pain Descriptors / Indicators Burning    Pain Type Neuropathic pain    Pain Onset More than a month ago     Pain Frequency Constant                               [facility] Adult PT Treatment/Exercise - 03/05/21 1451       Bed Mobility   Bed Mobility Rolling Right;Sit to Supine;Right Sidelying to Sit    Rolling Right Minimal Assistance - Patient > 75%   PT assisted to decrease friction under shoe so pt could flex knee/hip up on left. Used rocking with reaching across with LUE to roll.   Right Sidelying to Sit Moderate Assistance - Patient 50-74%   PT assisted to get right leg off mat and slightly on left then assist mostly to keep pt forward with verbal cues to push up on right elbow and hand.   Sit to Supine Moderate Assistance - Patient 50-74%   PT assisted at legs. Had pt try to scoot around first to make easier then she layed back on elbows.     Transfers   Transfers Lateral/Scoot Transfers    Lateral/Scoot Transfers 4: Min assist;From elevated surface;With slide board;With armrests removed  Lateral/Scoot Transfer Details (indicate cue type and reason) PT assisted to place slideboard in proper place with each transfer with pt able to lean away to lift off bottom. Pt able to remove board herself at end of each transfer. Verbal cues to use head/hips to help with movement which did help. Pt able to decrease pushes when board positioned correctly under ischial tubes. PT mostly assisted to reposition feet.      Therapeutic Activites    Therapeutic Activities Other Therapeutic Activities    Other Therapeutic Activities In long sit: PT first stretched pt's hamstrings out some with some overpressure x 30 sec. Worked on trying to move legs on mat to each side with shoes removed. Pt able to push/slide left leg to the right but had more difficulty with moving to left or moving right leg. Was trying to pull on right leg with right fingers which do not have any grip. Pt has right wrist extension so tried to get pt to utilize this to help with pulling right leg to the left. Also had her try  coming down on left forearm in long sit to get more leverage to pull but sitll had difficulty holding leg to try to move. Performed coming down on left forearm in long sit using right arm to anchor holding on leg to control descent and then back up min assist.      Exercises   Exercises Other Exercises    Other Exercises  Pt performed in long sit with shoulders extended modified tricep dips x 10. Supine: left hip/knee flexion/ext x 10 with PT supporting under heel and resistance in to extension. Left leg in hooklying trying to maintain position with light isometric contractions in abduction and adduction with 5 sec holds x 10 each with PT gradually building up resistance, left hip abd/ER then back up x 10. Attempted unilateral bridge on left with slight glut activation. Placed bolster under both knees and again attempted modified bridge/glut set x 10 with pt getting some carryover on right side. Unable to lift bottom off mat. Left SAQ x 10. Attempted right SAQ with muscle tapping to try to facilitate contraction with trace contraction.                     PT Education - 03/05/21 1554     Education Details Pt was instructed to work on left heel slides at home. Also to work on [employer] and can perform over rolled up towel to try to get more activation.    Person(s) Educated Patient;Caregiver(s)    Methods Explanation;Demonstration    Comprehension Verbalized understanding              PT Short Term Goals - 02/20/21 1030       PT SHORT TERM GOAL #1   Title Pt will be able to perform initial HEP for stretching and strengthening with caregiver.    Time 4    Period Weeks    Status New    Target Date 03/20/21      PT SHORT TERM GOAL #2   Title Pt will be able to perform slideboard transfer on level surface supervision after no more than min assist to help with board placement.    Time 4    Period Weeks    Target Date 03/20/21      PT SHORT TERM GOAL #3   Title Pt will be  able to perform rolling to each side supervision for improved bed mobility.  Time 4    Period Weeks    Status New    Target Date 03/20/21      PT SHORT TERM GOAL #4   Title Pt will be able to perform sit to/from supine transfer mod assist for improved bed mobility.    Time 4    Period Weeks    Status New    Target Date 03/20/21      PT SHORT TERM GOAL #5   Title Pt will be able to maintain sitting balance edge of mat with only feet supported x 5 min while performing UE movements for improved sitting balance/trunk stability to assist with sitting ADLs.    Time 4    Period Weeks    Status New    Target Date 03/20/21               PT Long Term Goals - 02/20/21 1035       PT LONG TERM GOAL #1   Title Pt will be able to perform progressive HEP for strengthening, stretching and balance to continue gains on own. (LTGs due 05/15/21)    Time 12    Period Weeks    Status New    Target Date 05/15/21      PT LONG TERM GOAL #2   Title Pt will be able to perform squat pivot transfer/lateral scoot transfer without slideboard CGA for improved mobility.    Time 12    Period Weeks    Status New    Target Date 05/15/21      PT LONG TERM GOAL #3   Title Pt will be able to stand at counter x 2 min min assist for improved standing ability.    Time 12    Period Weeks    Status New    Target Date 05/15/21      PT LONG TERM GOAL #4   Title Pt will be able to perform all bed mobility CGA for improved function.    Time 12    Period Weeks    Status New    Target Date 05/15/21      PT LONG TERM GOAL #5   Title Pt will report being able to perform 20 minutes of manual w/c propulsion around home for improved UE strength and mobility. (should be getting manual chair soon)    Time 12    Period Weeks    Status New    Target Date 05/15/21                   Plan - 03/05/21 1555     Clinical Impression Statement PT continued to work on functional mobility in long sit more  today. Pt was challenged with trying to move legs. Was easiest to push LLE. Pt is getting movement in LLE with trace contractions in right.    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[employer] officer Evolving/Moderate complexity    Rehab Potential Good    PT Frequency 2x / week   plus eval   PT Duration 12 weeks    PT Treatment/Interventions ADLs/Self Care Home Management;Electrical Stimulation;DME Instruction;Neuromuscular re-education;Manual techniques;Therapeutic exercise;Balance training;Therapeutic activities;Cryotherapy;Moist Heat;Functional mobility training;Stair training;Gait training;Patient/family education;Orthotic Fit/Training;Wheelchair mobility training;Dry needling;Passive range of motion;Vestibular    PT Next Visit Plan Has OT order come back from referring physician?  If not need  to schedule out more PT visits.  Slideboard transfer training, bed mobility especially side > sit. Continue to work on functional strengthening for UE, prone position for scapular strengthening. Work in long sit trying to move legs. At some point may try bioness to try to get more right quad activation and standing frame. Pt does have standing frame at home.    Consulted and Agree with Plan of Care Patient;Family member/caregiver    Family Member Consulted husband, [name]             Patient will benefit from skilled therapeutic intervention in order to improve the following deficits and impairments:  Decreased balance, Decreased mobility, Decreased strength, Impaired sensation, Postural dysfunction, Impaired flexibility, Impaired UE functional use, Impaired tone, Decreased range of motion  Visit Diagnosis: Muscle weakness  (generalized)  Quadriplegia, C5-C7 incomplete ([place])     Problem List Patient Active Problem List   Diagnosis Date Noted   Quadriplegia, C5-C7 incomplete ([place]) 01/16/2021   History of spinal fracture 01/16/2021   Suprapubic catheter ([place]) 01/16/2021   Encounter for routine gynecological examination 09/28/2013   Onychomycosis 09/28/2013   Foot deformity, acquired 03/26/2012   Encounter for preventive health examination 12/25/2010   ROSACEA 08/25/2009   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

## 2021-03-05 NOTE — Therapy (Signed)
[facility] [address] [place], [place], [address] Phone: [phone]   Fax:  [phone]  Physical Therapy Treatment  Patient Details  Name: [name] MRN: [id number] Date of Birth: [birth year] Referring Provider (PT): Landis Gandy   Encounter Date: 03/04/2021   PT End of Session - 03/04/21 1613     Visit Number 4    Number of Visits 25    Date for PT Re-Evaluation 05/15/21    Authorization Type humana medicare    PT Start Time 1610    PT Stop Time 1705    PT Time Calculation (min) 55 min    Equipment Utilized During Treatment --   slideboard   Activity Tolerance Patient tolerated treatment well    Behavior During Therapy [facility] for tasks assessed/performed             Past Medical History:  Diagnosis Date   CERVICAL POLYP 03/11/2008   Qualifier: Diagnosis of  By: Regis Bill MD, Standley Brooking    Colon polyps 2005   on colonscopy Dr. Fuller Plan   Fibroid 2004   Per Dr. Ouida Sills   History of shingles    face and mouth   Hx of skin cancer, basal cell    Rosacea    Sciatica of left side 09/28/2013   Scoliosis    noted on mri done for back pain    Past Surgical History:  Procedure Laterality Date   BUNIONECTOMY      There were no vitals filed for this visit.   Subjective Assessment - 03/04/21 1612     Subjective Pt reports that she went to her husband's family home in [place]. She is still doing OT at [facility] until the referral comes here but thinks Monday may have been her last session there.    Patient is accompained by: Family member   husband, Darrick Penna   Pertinent History Pt also takes Toviaz 4mg  daily. PMH: hyperlipidemia, scoliosis, sleep disorder    Patient Stated Goals Pt would like to be able to walk even if its with assistance. She also wants to be able to type and improve her ability to do ADLs to allow for more independence.    Currently in Pain? Yes    Pain Score 5     Pain Location Arm     Pain Orientation Right;Left    Pain Descriptors / Indicators Burning    Pain Type Neuropathic pain    Pain Onset More than a month ago    Pain Frequency Constant                               [facility] Adult PT Treatment/Exercise - 03/04/21 1614       Bed Mobility   Bed Mobility Sit to Supine;Rolling Left    Rolling Left Minimal Assistance - Patient > 75%;Moderate Assistance - Patient 50-74%   only /CGAmin assist if PT bent up right leg with instruction to reach across with right arm and rock. Mod assist at pelvis if right leg left staight.   Sit to Supine Moderate Assistance - Patient 50-74%   PT assisted at legs. Had pt try to scoot around first to make easier then she layed back on elbows.     Transfers   Transfers Lateral/Scoot Transfers    Lateral/Scoot Transfers 4: Min assist;From elevated surface;With slide board    Lateral/Scoot Transfer Details (indicate cue type and reason) PT  assisted to place slideboard in proper place with each transfer with pt able to lean away to lift off bottom. Pt able to remove board herself at end of each transfer. Verbal cues to use head/hips to help with movement which did help.      Therapeutic Activites    Therapeutic Activities Other Therapeutic Activities    Other Therapeutic Activities In left sidelying: trying to maintain position with PT providing resistance at shoulder girdle and then pelvis, pt rocking back to partially go to supine then returning to sidelying x 8 with cues to reach across with shoulder. Then performed supine to left sidelying starting with right knee bent by therapist x 3 with pt able to complete CGA. Trialed with RLE straight and crossed over left ankle with pt swinging arms to get momentum but still needed min/mod assist at pelvis to complete the movement. Prone on elbows initially with prone pillow but then removed to allow pt to get elbows under her more: Pt needed mod assist to slide elbows in more but  then able to maintain position. Had her perform scapular retraction/protraction 10 x 2. Then trialed stepping elbow to side and back but needed mod assist to complete x 2 on each side. Supine to long sit with propping on elbows then trying to extend arms behind to get to full long sit. Max assist to come up from PT with PT trying to help pt rock to weight shift to be able to extend arms behind. Pt performed in long sit trying to move legs to left to get off mat. Needed PT to unweight heels and CGA on left and mod assist on right. Pt utilized arms to try to help slide right leg over but needed some assist to stabilize.      Neuro Re-ed    Neuro Re-ed Details  Sitting edge of mat with feet on floor: coming down to side on forearm then pushing back up x 5 each side then adding in reaching across body with other arm for targets 2 x 20 sec in each position. Sitting with scapular retraction with red theraband 10 x 2 with wedge behind pt for safety. Cues to focus on upright posture. In long sit on mat with shoulders extended behind (right fingers off edge of mat to preserve tenodesis) with tricep dips x 10 with PT muscle tapping to help with initiation. PT discussed with pt and demonstated at end of session tricep extension against red theraband with aide.                     PT Education - 03/05/21 0831     Education Details PT issued pictures of the stretches and added scapular retraction. Verbally discussed seated tricep extension against red theraband as pt wanting something more in chair for tricep work.    Person(s) Educated Patient;Caregiver(s)    Methods Explanation    Comprehension Verbalized understanding              PT Short Term Goals - 02/20/21 1030       PT SHORT TERM GOAL #1   Title Pt will be able to perform initial HEP for stretching and strengthening with caregiver.    Time 4    Period Weeks    Status New    Target Date 03/20/21      PT SHORT TERM GOAL #2   Title  Pt will be able to perform slideboard transfer on level surface supervision after no more  than min assist to help with board placement.    Time 4    Period Weeks    Target Date 03/20/21      PT SHORT TERM GOAL #3   Title Pt will be able to perform rolling to each side supervision for improved bed mobility.    Time 4    Period Weeks    Status New    Target Date 03/20/21      PT SHORT TERM GOAL #4   Title Pt will be able to perform sit to/from supine transfer mod assist for improved bed mobility.    Time 4    Period Weeks    Status New    Target Date 03/20/21      PT SHORT TERM GOAL #5   Title Pt will be able to maintain sitting balance edge of mat with only feet supported x 5 min while performing UE movements for improved sitting balance/trunk stability to assist with sitting ADLs.    Time 4    Period Weeks    Status New    Target Date 03/20/21               PT Long Term Goals - 02/20/21 1035       PT LONG TERM GOAL #1   Title Pt will be able to perform progressive HEP for strengthening, stretching and balance to continue gains on own. (LTGs due 05/15/21)    Time 12    Period Weeks    Status New    Target Date 05/15/21      PT LONG TERM GOAL #2   Title Pt will be able to perform squat pivot transfer/lateral scoot transfer without slideboard CGA for improved mobility.    Time 12    Period Weeks    Status New    Target Date 05/15/21      PT LONG TERM GOAL #3   Title Pt will be able to stand at counter x 2 min min assist for improved standing ability.    Time 12    Period Weeks    Status New    Target Date 05/15/21      PT LONG TERM GOAL #4   Title Pt will be able to perform all bed mobility CGA for improved function.    Time 12    Period Weeks    Status New    Target Date 05/15/21      PT LONG TERM GOAL #5   Title Pt will report being able to perform 20 minutes of manual w/c propulsion around home for improved UE strength and mobility. (should be getting  manual chair soon)    Time 12    Period Weeks    Status New    Target Date 05/15/21                   Plan - 03/05/21 9485     Clinical Impression Statement PT continued to work on functional mobility with transfers and bed mobility. Pt improving with rolling left especially when right leg is bent up. She did well in prone with no reports of issues. Mild winging at scapula but overall performed retraction/protraction well. Unable to shift weight to one arm to try to step with other arm.    Personal Factors and Comorbidities Comorbidity 2    Comorbidities scoliosis and sleep disorder    Examination-Activity Limitations Bed Mobility;Locomotion Level;Transfers;Stand;Bathing;Dressing    Examination-Participation Freight forwarder;Yard Work    Stability/Clinical  Decision Making Evolving/Moderate complexity    Rehab Potential Good    PT Frequency 2x / week   plus eval   PT Duration 12 weeks    PT Treatment/Interventions ADLs/Self Care Home Management;Electrical Stimulation;DME Instruction;Neuromuscular re-education;Manual techniques;Therapeutic exercise;Balance training;Therapeutic activities;Cryotherapy;Moist Heat;Functional mobility training;Stair training;Gait training;Patient/family education;Orthotic Fit/Training;Wheelchair mobility training;Dry needling;Passive range of motion;Vestibular    PT Next Visit Plan Has OT order come back from referring physician?    Slideboard transfer training, bed mobility especially side > sit. Continue to work on functional strengthening for UE, prone position for scapular strengthening. Work in long sit trying to move legs.    Consulted and Agree with Plan of Care Patient;Family member/caregiver    Family Member Consulted husband, [name]             Patient will benefit from skilled therapeutic intervention in order to improve the following deficits and impairments:  Decreased balance, Decreased mobility, Decreased  strength, Impaired sensation, Postural dysfunction, Impaired flexibility, Impaired UE functional use, Impaired tone, Decreased range of motion  Visit Diagnosis: Quadriplegia, C5-C7 incomplete ([facility])  Muscle weakness (generalized)     Problem List Patient Active Problem List   Diagnosis Date Noted   Quadriplegia, C5-C7 incomplete ([place]) 01/16/2021   History of spinal fracture 01/16/2021   Suprapubic catheter ([place]) 01/16/2021   Encounter for routine gynecological examination 09/28/2013   Onychomycosis 09/28/2013   Foot deformity, acquired 03/26/2012   Encounter for preventive health examination 12/25/2010   ROSACEA 08/25/2009   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 [place], [place], [address] Phone: [phone]   Fax:  [phone]  Name: Yuriana Gaal MRN: [id number] Date of Birth: January 26, 1951

## 2021-03-06 DIAGNOSIS — N3000 Acute cystitis without hematuria: Secondary | ICD-10-CM | POA: Diagnosis not present

## 2021-03-06 DIAGNOSIS — N312 Flaccid neuropathic bladder, not elsewhere classified: Secondary | ICD-10-CM | POA: Diagnosis not present

## 2021-03-06 DIAGNOSIS — R338 Other retention of urine: Secondary | ICD-10-CM | POA: Diagnosis not present

## 2021-03-10 ENCOUNTER — Other Ambulatory Visit: Payer: Self-pay

## 2021-03-10 ENCOUNTER — Telehealth: Payer: Self-pay

## 2021-03-10 ENCOUNTER — Ambulatory Visit: Payer: Medicare PPO

## 2021-03-10 DIAGNOSIS — R278 Other lack of coordination: Secondary | ICD-10-CM | POA: Diagnosis not present

## 2021-03-10 DIAGNOSIS — M6281 Muscle weakness (generalized): Secondary | ICD-10-CM

## 2021-03-10 DIAGNOSIS — G8254 Quadriplegia, C5-C7 incomplete: Secondary | ICD-10-CM | POA: Diagnosis not present

## 2021-03-10 DIAGNOSIS — R208 Other disturbances of skin sensation: Secondary | ICD-10-CM | POA: Diagnosis not present

## 2021-03-10 DIAGNOSIS — R2689 Other abnormalities of gait and mobility: Secondary | ICD-10-CM | POA: Diagnosis not present

## 2021-03-10 DIAGNOSIS — R293 Abnormal posture: Secondary | ICD-10-CM | POA: Diagnosis not present

## 2021-03-10 DIAGNOSIS — G8253 Quadriplegia, C5-C7 complete: Secondary | ICD-10-CM | POA: Diagnosis not present

## 2021-03-10 DIAGNOSIS — R29818 Other symptoms and signs involving the nervous system: Secondary | ICD-10-CM | POA: Diagnosis not present

## 2021-03-10 NOTE — Progress Notes (Signed)
  Subjective:  Patient ID: Markham Jordan, female    DOB: 06/11/1951,  MRN: [id number]  Chief Complaint  Patient presents with   Foot Ulcer    3 week follow up bilateral toe ulcers    70 y.o. female presents with the above complaint. History confirmed with patient.  She had some improvement she completed the blood flow testing  Objective:  Physical Exam: The feet have significant +2 to +3 pitting edema, cyanosis and pallor with palpation although her dorsalis pedis pulses still weakly palpable.  She has unstageable pressure injuries of the lateral fifth metatarsal and dorsal lateral fifth PIPJ's   ABIs reviewed good flow with excellent waveforms Assessment:   1. Pressure injury of toe, unstageable, unspecified laterality ([place])      Plan:  Patient was evaluated and treated and all questions answered.  Overall doing well and seems to be healing I would continue using the mupirocin ointment I did debride the overlying portions of the callus tissue today to a comfortable level and recommend she continue the open toe compression stockings to keep her edema control without causing pressure on the wounds.  Hopefully will be nearly fully healed by next visit  Return in about 3 weeks (around 03/26/2021) for wound care.

## 2021-03-10 NOTE — Therapy (Signed)
[facility] [address] [place], [place], [address] Phone: [phone]   Fax:  [phone]  Physical Therapy Treatment  Patient Details  Name: [name] MRN: [id number] Date of Birth: Dec 16, 1950 Referring Provider (PT): Landis Gandy   Encounter Date: 03/10/2021   PT End of Session - 03/10/21 0924     Visit Number 6    Number of Visits 25    Date for PT Re-Evaluation 05/15/21    Authorization Type [employer] 25 visits 9/8-12/2/22    Authorization - Visit Number 6    Authorization - Number of Visits 25    PT Start Time 0923    PT Stop Time 1018    PT Time Calculation (min) 55 min    Equipment Utilized During Treatment --   slideboard   Activity Tolerance Patient tolerated treatment well    Behavior During Therapy [facility] for tasks assessed/performed             Past Medical History:  Diagnosis Date   CERVICAL POLYP 03/11/2008   Qualifier: Diagnosis of  By: Regis Bill MD, Standley Brooking    Colon polyps 2005   on colonscopy Dr. Fuller Plan   Fibroid 2004   Per Dr. Ouida Sills   History of shingles    face and mouth   Hx of skin cancer, basal cell    Rosacea    Sciatica of left side 09/28/2013   Scoliosis    noted on mri done for back pain    Past Surgical History:  Procedure Laterality Date   BUNIONECTOMY      There were no vitals filed for this visit.   Subjective Assessment - 03/10/21 0924     Subjective Pt reports they celebrated her husband's birthday this weekend. Pt no longer has the fulltime caregiver as she left. In process of trying to get a new one.    Patient is accompained by: Family member   husband, Darrick Penna   Pertinent History Pt also takes Toviaz 4mg  daily. PMH: hyperlipidemia, scoliosis, sleep disorder    Patient Stated Goals Pt would like to be able to walk even if its with assistance. She also wants to be able to type and improve her ability to do ADLs to allow for more  independence.    Currently in Pain? Yes    Pain Score 5     Pain Location Arm    Pain Orientation Right;Left    Pain Descriptors / Indicators Burning    Pain Type Neuropathic pain    Pain Onset More than a month ago    Pain Frequency Constant                               [facility] Adult PT Treatment/Exercise - 03/10/21 0925       Transfers   Transfers Lateral/Scoot Transfers    Lateral/Scoot Transfers 4: Min assist;4: Min guard;From elevated surface;With armrests removed;With slide board    Lateral/Scoot Transfer Details (indicate cue type and reason) PT assisted to place slideboard in proper place with each transfer with pt able to lean away to lift off bottom. Pt able to remove board herself at end of each transfer. Verbal cues to use head/hips to help with movement which did help. PT had pt tell her what she needed help with. PT was only asked to reposition feet half way through transfer.      Neuro Re-ed  Neuro Re-ed Details  Sitting edge of mat with wedge behind: lean back with arms behind her doing modified tricep dips with her body weight leaning back and then coming up x 10. Sitting upright without UE support trying to maintain balance with light manual pertubations x 1 min. Reaching forward to pick up 2 cones off floor close SBA/CGA. Reaching across body for ones with coming down on forearm and then back up x 5 each side. Controlling leaning back on wedge with biceps and then coming back up x 10.      Exercises   Exercises Other Exercises    Other Exercises  Seated edge of mat: left foot on foam roll with knee flexion/ext x 10 then trying to maintain foot on roll with PT providing isometric resistance  x 30 sec in flexion and extension. Sitting in powerchair:  with left foot rest up LAQ x 10 through partial range, hip abduction into lateral thigh supports for isometric holds 5 x 5 sec on left. Advised to try to tighten bottom on right to engage some. Bilateral  shoulder depression x 10 on armrests. PT instructed again on how to perform resisted tricep the therband sitting to pt and husband.                     PT Education - 03/10/21 1556     Education Details PT reviewed exercises that pt could perform from her powerchair as she spends all day there.    Person(s) Educated Patient;Spouse    Methods Explanation;Demonstration    Comprehension Verbalized understanding              PT Short Term Goals - 02/20/21 1030       PT SHORT TERM GOAL #1   Title Pt will be able to perform initial HEP for stretching and strengthening with caregiver.    Time 4    Period Weeks    Status New    Target Date 03/20/21      PT SHORT TERM GOAL #2   Title Pt will be able to perform slideboard transfer on level surface supervision after no more than min assist to help with board placement.    Time 4    Period Weeks    Target Date 03/20/21      PT SHORT TERM GOAL #3   Title Pt will be able to perform rolling to each side supervision for improved bed mobility.    Time 4    Period Weeks    Status New    Target Date 03/20/21      PT SHORT TERM GOAL #4   Title Pt will be able to perform sit to/from supine transfer mod assist for improved bed mobility.    Time 4    Period Weeks    Status New    Target Date 03/20/21      PT SHORT TERM GOAL #5   Title Pt will be able to maintain sitting balance edge of mat with only feet supported x 5 min while performing UE movements for improved sitting balance/trunk stability to assist with sitting ADLs.    Time 4    Period Weeks    Status New    Target Date 03/20/21               PT Long Term Goals - 02/20/21 1035       PT LONG TERM GOAL #1   Title Pt will be able to perform progressive  HEP for strengthening, stretching and balance to continue gains on own. (LTGs due 05/15/21)    Time 12    Period Weeks    Status New    Target Date 05/15/21      PT LONG TERM GOAL #2   Title Pt will be  able to perform squat pivot transfer/lateral scoot transfer without slideboard CGA for improved mobility.    Time 12    Period Weeks    Status New    Target Date 05/15/21      PT LONG TERM GOAL #3   Title Pt will be able to stand at counter x 2 min min assist for improved standing ability.    Time 12    Period Weeks    Status New    Target Date 05/15/21      PT LONG TERM GOAL #4   Title Pt will be able to perform all bed mobility CGA for improved function.    Time 12    Period Weeks    Status New    Target Date 05/15/21      PT LONG TERM GOAL #5   Title Pt will report being able to perform 20 minutes of manual w/c propulsion around home for improved UE strength and mobility. (should be getting manual chair soon)    Time 12    Period Weeks    Status New    Target Date 05/15/21                   Plan - 03/10/21 1557     Clinical Impression Statement PT continued to work on sitting balance and strengthening today. Since pt spends her days in powerchair went over exercises she could perform for most part on own once she was set up for them. Pt continues to show improvement in lateral slideboard transfer and with scooting along edge of mat.    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navy officer Evolving/Moderate complexity    Rehab Potential Good    PT Frequency 2x / week   plus eval   PT Duration 12 weeks    PT Treatment/Interventions ADLs/Self Care Home Management;Electrical Stimulation;DME Instruction;Neuromuscular re-education;Manual techniques;Therapeutic exercise;Balance training;Therapeutic activities;Cryotherapy;Moist Heat;Functional mobility training;Stair training;Gait training;Patient/family  education;Orthotic Fit/Training;Wheelchair mobility training;Dry needling;Passive range of motion;Vestibular    PT Next Visit Plan Has OT order come back from referring physician?   Slideboard transfer training, bed mobility especially side > sit. Continue to work on functional strengthening for UE, prone position for scapular strengthening. Short sit balance activities to try to engage core some. Work in long sit trying to move legs. At some point may try bioness to try to get more right quad activation and standing frame. Pt does have standing frame at home.    Consulted and Agree with Plan of Care Patient;Family member/caregiver    Family Member Consulted husband, [name]             Patient will benefit from skilled therapeutic intervention in order to improve the following deficits and impairments:  Decreased balance, Decreased mobility, Decreased strength, Impaired sensation, Postural dysfunction, Impaired flexibility, Impaired UE functional use, Impaired tone, Decreased range of motion  Visit Diagnosis: Muscle weakness (generalized)  Quadriplegia, C5-C7 incomplete ([facility])     Problem List Patient Active Problem List   Diagnosis Date Noted  Quadriplegia, C5-C7 incomplete ([facility]) 01/16/2021   History of spinal fracture 01/16/2021   Suprapubic catheter ([place]) 01/16/2021   Encounter for routine gynecological examination 09/28/2013   Onychomycosis 09/28/2013   Foot deformity, acquired 03/26/2012   Encounter for preventive health examination 12/25/2010   ROSACEA 08/25/2009   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

## 2021-03-10 NOTE — Telephone Encounter (Signed)
PT called again to Physical Medicine and rehabilitation at [facility] for Dr. Unk Lightning to follow-up on OT referral. [place] there again and she reports they do have the request still and is on the doctor's desk. He will be in clinic today so can hopefully get him to sign it. Verified that they have the correct fax number for our office.  Cherly Anderson, PT, DPT, NCS

## 2021-03-13 ENCOUNTER — Ambulatory Visit: Payer: Medicare PPO | Admitting: Physical Therapy

## 2021-03-13 ENCOUNTER — Encounter: Payer: Self-pay | Admitting: Occupational Therapy

## 2021-03-13 ENCOUNTER — Ambulatory Visit: Payer: Medicare PPO | Admitting: Occupational Therapy

## 2021-03-13 ENCOUNTER — Other Ambulatory Visit: Payer: Self-pay

## 2021-03-13 DIAGNOSIS — R29818 Other symptoms and signs involving the nervous system: Secondary | ICD-10-CM

## 2021-03-13 DIAGNOSIS — R278 Other lack of coordination: Secondary | ICD-10-CM

## 2021-03-13 DIAGNOSIS — M6281 Muscle weakness (generalized): Secondary | ICD-10-CM

## 2021-03-13 DIAGNOSIS — G8253 Quadriplegia, C5-C7 complete: Secondary | ICD-10-CM

## 2021-03-13 DIAGNOSIS — G8254 Quadriplegia, C5-C7 incomplete: Secondary | ICD-10-CM

## 2021-03-13 DIAGNOSIS — R293 Abnormal posture: Secondary | ICD-10-CM

## 2021-03-13 DIAGNOSIS — R2689 Other abnormalities of gait and mobility: Secondary | ICD-10-CM | POA: Diagnosis not present

## 2021-03-13 DIAGNOSIS — R208 Other disturbances of skin sensation: Secondary | ICD-10-CM

## 2021-03-13 NOTE — Therapy (Signed)
[facility] [address] [place], [place], [address] Phone: [phone]   Fax:  [phone]  Physical Therapy Treatment  Patient Details  Name: [name] MRN: [id number] Date of Birth: [birth year] Referring Provider (PT): Landis Gandy   Encounter Date: 03/13/2021   PT End of Session - 03/13/21 1247     Visit Number 7    Number of Visits 25    Date for PT Re-Evaluation 05/15/21    Authorization Type human[employer] medicare 25 visits 9/8-12/2/22    Authorization - Visit Number 7    Authorization - Number of Visits 25    Progress Note Due on Visit 10    PT Start Time 1143    PT Stop Time 1230    PT Time Calculation (min) 47 min    Equipment Utilized During Treatment --   slideboard   Activity Tolerance Patient tolerated treatment well    Behavior During Therapy [facility] for tasks assessed/performed             Past Medical History:  Diagnosis Date   CERVICAL POLYP 03/11/2008   Qualifier: Diagnosis of  By: Regis Bill MD, Standley Brooking    Colon polyps 2005   on colonscopy Dr. Fuller Plan   Fibroid 2004   Per Dr. Ouida Sills   History of shingles    face and mouth   Hx of skin cancer, basal cell    Rosacea    Sciatica of left side 09/28/2013   Scoliosis    noted on mri done for back pain    Past Surgical History:  Procedure Laterality Date   BUNIONECTOMY      There were no vitals filed for this visit.   Subjective Assessment - 03/13/21 1246     Subjective Able to get OT eval scheduled today after PT.  No issues to report.    Patient is accompained by: Family member   husband, Darrick Penna   Pertinent History Pt also takes Toviaz 4mg  daily. PMH: hyperlipidemia, scoliosis, sleep disorder    Patient Stated Goals Pt would like to be able to walk even if its with assistance. She also wants to be able to type and improve her ability to do ADLs to allow for more independence.    Currently in Pain? No/denies    Pain Onset More  than a month ago             Pt performed 75% of set up for slideboard transfer w/c > mat with therapist assisting with removing lateral supports, repositioning R foot and completing slideboard placement - pt able to start slideboard placement but unable to position fully under ischial tuberosity.  Pt able to perform slide across board to mat in downhill position with supervision-min guard.  Assisted with short sitting > long sitting with mod-max A.  In long sitting performed long sitting <> propped on elbow in front of BOS x 5 reps each side bringing elbow to pillow and then 5 reps bringing elbow all the way down to mat.  Focused on use of trunk flexion, momentum and UE extension to return to sitting.  Changed to lateral leans down to elbow on R and L x 5 reps each side behind COG and coming back up to sitting utilizing trunk flexion to work elbow forwards of COG and then pushing up to sitting.    Transitioned to short sitting with min-mod A to bring each LE off mat and continued to perform lateral leans to R  and L but adding in resistance; therapist provided manual resistance at trunk as pt returned to sitting x 5 reps to each side.  Positioned 8" block beside pt on each side under each elbow and performed 12 reps closed chain scapular and shoulder depression with chest lift and slight anterior lean.  Continued UE strengthening with red resistance band performing unilateral bicep curl with contralateral UE providing support and stability x 12 reps; bilat rows combined with anterior/posterior pelvic tilts and spinal flexion <> extension to counter balance when UE moved forwards and backwards with min A x 12 reps; performed ipsilateral punches for scapular protraction x 12 reps each side with contralateral UE providing stability and balance.  For slideboard back to w/c pt performed lateral lean but required therapist to place board.  Performed slideboard back into w/c with supervision-min A with min  assistance to reposition hips.     PT Short Term Goals - 02/20/21 1030       PT SHORT TERM GOAL #1   Title Pt will be able to perform initial HEP for stretching and strengthening with caregiver.    Time 4    Period Weeks    Status New    Target Date 03/20/21      PT SHORT TERM GOAL #2   Title Pt will be able to perform slideboard transfer on level surface supervision after no more than min assist to help with board placement.    Time 4    Period Weeks    Target Date 03/20/21      PT SHORT TERM GOAL #3   Title Pt will be able to perform rolling to each side supervision for improved bed mobility.    Time 4    Period Weeks    Status New    Target Date 03/20/21      PT SHORT TERM GOAL #4   Title Pt will be able to perform sit to/from supine transfer mod assist for improved bed mobility.    Time 4    Period Weeks    Status New    Target Date 03/20/21      PT SHORT TERM GOAL #5   Title Pt will be able to maintain sitting balance edge of mat with only feet supported x 5 min while performing UE movements for improved sitting balance/trunk stability to assist with sitting ADLs.    Time 4    Period Weeks    Status New    Target Date 03/20/21               PT Long Term Goals - 02/20/21 1035       PT LONG TERM GOAL #1   Title Pt will be able to perform progressive HEP for strengthening, stretching and balance to continue gains on own. (LTGs due 05/15/21)    Time 12    Period Weeks    Status New    Target Date 05/15/21      PT LONG TERM GOAL #2   Title Pt will be able to perform squat pivot transfer/lateral scoot transfer without slideboard CGA for improved mobility.    Time 12    Period Weeks    Status New    Target Date 05/15/21      PT LONG TERM GOAL #3   Title Pt will be able to stand at counter x 2 min min assist for improved standing ability.    Time 12    Period Weeks    Status  New    Target Date 05/15/21      PT LONG TERM GOAL #4   Title Pt will be  able to perform all bed mobility CGA for improved function.    Time 12    Period Weeks    Status New    Target Date 05/15/21      PT LONG TERM GOAL #5   Title Pt will report being able to perform 20 minutes of manual w/c propulsion around home for improved UE strength and mobility. (should be getting manual chair soon)    Time 12    Period Weeks    Status New    Target Date 05/15/21                   Plan - 03/13/21 1248     Clinical Impression Statement Continued to incorporate closed chain UE strengthening with functional bed mobility, transfers and repositioning.  Also continued to combine open chain dynamic UE movement and strengthening with sitting balance and postural control training.  Pt requiring decreased assistance to set up for and complete slideboard transfer but still requires downhill set up to perform without assistance.    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navy officer Evolving/Moderate complexity    Rehab Potential Good    PT Frequency 2x / week   plus eval   PT Duration 12 weeks    PT Treatment/Interventions ADLs/Self Care Home Management;Electrical Stimulation;DME Instruction;Neuromuscular re-education;Manual techniques;Therapeutic exercise;Balance training;Therapeutic activities;Cryotherapy;Moist Heat;Functional mobility training;Stair training;Gait training;Patient/family education;Orthotic Fit/Training;Wheelchair mobility training;Dry needling;Passive range of motion;Vestibular    PT Next Visit Plan STG due by end of week.  Slideboard transfer training working towards more level transfer, bed mobility especially side > sit. Continue to work on functional strengthening for UE, prone position  for scapular strengthening. Short sit balance activities to try to engage core some. Work in long sit trying to move legs. At some point may try bioness to try to get more right quad activation and standing frame. Pt does have standing frame at home.    Consulted and Agree with Plan of Care Patient;Family member/caregiver    Family Member Consulted husband, [name]             Patient will benefit from skilled therapeutic intervention in order to improve the following deficits and impairments:  Decreased balance, Decreased mobility, Decreased strength, Impaired sensation, Postural dysfunction, Impaired flexibility, Impaired UE functional use, Impaired tone, Decreased range of motion  Visit Diagnosis: Muscle weakness (generalized)  Quadriplegia, C5-C7 incomplete ([facility])  Abnormal posture  Other abnormalities of gait and mobility     Problem List Patient Active Problem List   Diagnosis Date Noted   Quadriplegia, C5-C7 incomplete ([facility]) 01/16/2021   History of spinal fracture 01/16/2021   Suprapubic catheter ([place]) 01/16/2021   Encounter for routine gynecological examination 09/28/2013   Onychomycosis 09/28/2013   Foot deformity, acquired 03/26/2012   Encounter for preventive health examination 12/25/2010   ROSACEA 08/25/2009   Disturbance in sleep behavior 03/11/2008   SKIN CANCER, HX OF 03/11/2008   DYSURIA, HX OF 03/11/2008   Hyperlipidemia 02/10/2007   CERVICALGIA 02/10/2007    Rico Junker, PT, DPT 03/13/21    1:05 PM    [facility] [address] [place], [place], [address] Phone: [phone]   Fax:  [place]  Name: [name] MRN: [id number] Date of Birth: 1951/05/22
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
## 2021-03-16 NOTE — Addendum Note (Signed)
Addended by: Zachery Conch on: 03/16/2021 09:51 AM   Modules accepted: Orders

## 2021-03-17 ENCOUNTER — Ambulatory Visit: Payer: Medicare PPO | Admitting: Physical Therapy

## 2021-03-17 ENCOUNTER — Ambulatory Visit: Payer: Medicare PPO | Admitting: Occupational Therapy

## 2021-03-17 ENCOUNTER — Other Ambulatory Visit: Payer: Self-pay

## 2021-03-17 ENCOUNTER — Encounter: Payer: Self-pay | Admitting: Occupational Therapy

## 2021-03-17 ENCOUNTER — Ambulatory Visit: Payer: Medicare PPO | Attending: Physical Medicine and Rehabilitation

## 2021-03-17 DIAGNOSIS — R293 Abnormal posture: Secondary | ICD-10-CM | POA: Insufficient documentation

## 2021-03-17 DIAGNOSIS — R278 Other lack of coordination: Secondary | ICD-10-CM

## 2021-03-17 DIAGNOSIS — R208 Other disturbances of skin sensation: Secondary | ICD-10-CM | POA: Diagnosis not present

## 2021-03-17 DIAGNOSIS — G8253 Quadriplegia, C5-C7 complete: Secondary | ICD-10-CM

## 2021-03-17 DIAGNOSIS — R29818 Other symptoms and signs involving the nervous system: Secondary | ICD-10-CM

## 2021-03-17 DIAGNOSIS — G8254 Quadriplegia, C5-C7 incomplete: Secondary | ICD-10-CM | POA: Insufficient documentation

## 2021-03-17 DIAGNOSIS — R2689 Other abnormalities of gait and mobility: Secondary | ICD-10-CM | POA: Diagnosis not present

## 2021-03-17 DIAGNOSIS — M6281 Muscle weakness (generalized): Secondary | ICD-10-CM | POA: Diagnosis not present

## 2021-03-17 NOTE — Patient Instructions (Signed)
Basic Activities:    Use your affected hand to perform the following activities for 20-30 minutes 1-2 times/day.  Stop activity if you experience pain.   - Flip playing cards - Deal top card with thumb - Toss ball - Rotate ball in your hand  - Turn doorknob - Open/close cabinet door with handle - Pick up coins and place in a container - Stack coins (stacks of 5) and manipulate one at a time to fingertips to place in bank/container - Fold towels - Stack blocks - Pick up 1-inch blocks - Put empty clothes hangers on a rack, remove, and repeat    

## 2021-03-17 NOTE — Therapy (Signed)
[facility] [address] [place], [place], [address] Phone: [phone]   Fax:  [phone]  Occupational Therapy Treatment  Patient Details  Name: [name] MRN: [id number] Date of Birth: [birth year] Referring Provider (OT): Ina Homes, MD   Encounter Date: 03/17/2021   OT End of Session - 03/17/21 1124     Visit Number 2    Number of Visits 25    Date for OT Re-Evaluation 06/05/21    Authorization Type [employer]    Authorization Time Period Auth Req'd    OT Start Time 1016    OT Stop Time 1102    OT Time Calculation (min) 46 min    Activity Tolerance Patient tolerated treatment well    Behavior During Therapy [facility] for tasks assessed/performed             Past Medical History:  Diagnosis Date   CERVICAL POLYP 03/11/2008   Qualifier: Diagnosis of  By: Regis Bill MD, Standley Brooking    Colon polyps 2005   on colonscopy Dr. Fuller Plan   Fibroid 2004   Per Dr. Ouida Sills   History of shingles    face and mouth   Hx of skin cancer, basal cell    Rosacea    Sciatica of left side 09/28/2013   Scoliosis    noted on mri done for back pain    Past Surgical History:  Procedure Laterality Date   BUNIONECTOMY      There were no vitals filed for this visit.   Subjective Assessment - 03/17/21 1017     Subjective  "Sitting with legs extended"    Patient is accompanied by: Family member   husband, [name]   Pertinent History hyperlipidemia, scoliosis, sleep disorder    Patient Stated Goals regain arm strength for rolling over in bed and fine motor coordination for increasing typing    Currently in Pain? Yes    Pain Score 4     Pain Location Arm    Pain Orientation Right;Left    Pain Descriptors / Indicators Burning    Pain Type Neuropathic pain    Pain Onset More than a month ago    Pain Frequency Constant                Coordination RUE Connect Four Chips, LUE pennies, flipping  regular size playing cards BUE, Grooved Pegs LUE (7 pegs before needing a break d/t fatigue) Semi Circle Pegs RUE for white and red, LUE with gray. Pt removed with RUE.   Light Strengthening with yellow theraband for triceps BUE x 10 reps x 3 sets - issued for home.                    OT Short Term Goals - 03/17/21 1124       OT SHORT TERM GOAL #1   Title Pt will be independent with HEP w CG assistance PRN    Time 4    Period Weeks    Status On-going    Target Date 04/13/21      OT SHORT TERM GOAL #2   Title Pt will verbalize understanding and report independence with CG assistance with ues of modalities at home with good safety.    Baseline has paraffin and Estim unit    Time 4    Period Weeks    Status New      OT SHORT TERM GOAL #3   Title Pt will increase Box and  Blocks score with RUE to 30 blocks or greater    Baseline R 26 L 39    Time 4    Period Weeks    Status New      OT SHORT TERM GOAL #4   Title Pt will increase BUE tricep strength to 4/5 consistently for increasing ability to perform SB transfers with supervision/set up assistance only.    Baseline 3+/5 strength BUE    Time 4    Period Weeks    Status New      OT SHORT TERM GOAL #5   Title Pt will increase coordination in LUE to completing 9 hole peg test in 70 seconds or less.    Baseline L 75.13s, R unable    Time 4    Period Weeks    Status New      OT SHORT TERM GOAL #6   Title Pt will verbalize understanding of adapted strategies and/or equipment for increasing indepenence with ADLs and IADLs (typing, bathing, cutting food, etc)    Baseline has U cuff    Time 4    Period Weeks    Status New               OT Long Term Goals - 03/16/21 0943       OT LONG TERM GOAL #1   Title Pt will be independence with any updated HEP    Time 12    Period Weeks    Status New    Target Date 06/08/21      OT LONG TERM GOAL #2   Title Pt will increase functional use of BUE evidenced by  completing Box and Blocks with score of 35 or greater with RUE, 45 or greater with LUE.    Baseline R 26, L 39    Time 12    Period Weeks    Status New      OT LONG TERM GOAL #3   Title PT will improve grip strength in BUE by increasing grip strength to 10 lbs or greater with RUE and 20 lbs or greater with LUE.    Baseline R 1.5, L 12.3    Time 12    Period Weeks    Status New      OT LONG TERM GOAL #4   Title Pt will improve isolated finger movements in order to increase skill towards simple typing with adapted strategies and equipment PRN.    Baseline isolated movement in LUE    Time 12    Period Weeks    Status New      OT LONG TERM GOAL #5   Title Pt will improve 9 hole peg test in LUE to completing in 65 seconds or less in order to increase functional use and demonstrate ability to place 2 or more pegs with RUE.    Baseline L 75.13s, R unable    Time 12    Period Weeks    Status New      OT LONG TERM GOAL #6   Title Pt will report completing UB and LB dressing with decreased assistance consistently.    Baseline UB (reports able to do but not consistently doing), LB total A    Time 12    Period Weeks    Status New                   Plan - 03/17/21 1024     Clinical Impression Statement Pt verbalized understanding and agreement  with goals.    OT Occupational Profile and History Detailed Assessment- Review of Records and additional review of physical, cognitive, psychosocial history related to current functional performance    Occupational performance deficits (Please refer to evaluation for details): ADL's;IADL's;Leisure;Work;Rest and Sleep    Body Structure / Function / Physical Skills ADL;IADL;ROM;Strength;Decreased knowledge of use of DME;Dexterity;GMC;Pain;Tone;UE functional use;Body mechanics;Balance;Continence;FMC;Muscle spasms;Skin integrity;Flexibility;Mobility;Sensation;Improper spinal/pelvic alignment;Endurance    Rehab Potential Good    Clinical  Decision Making Several treatment options, min-mod task modification necessary    Comorbidities Affecting Occupational Performance: May have comorbidities impacting occupational performance    Modification or Assistance to Complete Evaluation  Min-Moderate modification of tasks or assist with assess necessary to complete eval    OT Frequency 2x / week    OT Duration 12 weeks    OT Treatment/Interventions Self-care/ADL training;Moist Heat;Fluidtherapy;DME and/or AE instruction;Splinting;Therapeutic activities;Aquatic Therapy;Ultrasound;Therapeutic exercise;Cognitive remediation/compensation;Passive range of motion;Functional Mobility Training;Neuromuscular education;Electrical Stimulation;Paraffin;Manual Therapy;Patient/family education    Plan if bring splints in - see how fit, coordination BUE, supine shoulder ROM and exercises, wrist and tricep strengthening    Consulted and Agree with Plan of Care Patient;Family member/caregiver    Family Member Consulted spouse [name]             Patient will benefit from skilled therapeutic intervention in order to improve the following deficits and impairments:   Body Structure / Function / Physical Skills: ADL, IADL, ROM, Strength, Decreased knowledge of use of DME, Dexterity, GMC, Pain, Tone, UE functional use, Body mechanics, Balance, Continence, FMC, Muscle spasms, Skin integrity, Flexibility, Mobility, Sensation, Improper spinal/pelvic alignment, Endurance       Visit Diagnosis: Muscle weakness (generalized)  Other lack of coordination  Other symptoms and signs involving the nervous system  Other disturbances of skin sensation  Quadriplegia, C5-C7 incomplete (HCC)    Problem List Patient Active Problem List   Diagnosis Date Noted   Quadriplegia, C5-C7 incomplete ([place]) 01/16/2021   History of spinal fracture 01/16/2021   Suprapubic catheter ([place]) 01/16/2021   Encounter for routine gynecological examination 09/28/2013   Onychomycosis  09/28/2013   Foot deformity, acquired 03/26/2012   Encounter for preventive health examination 12/25/2010   ROSACEA 08/25/2009   Disturbance in sleep behavior 03/11/2008   SKIN CANCER, HX OF 03/11/2008   DYSURIA, HX OF 03/11/2008   Hyperlipidemia 02/10/2007   CERVICALGIA 02/10/2007    Zachery Conch, OT/L 03/17/2021, 11:25 AM  [facility] [address] [place], [place], [address] Phone: [phone]   Fax:  [phone]  Name: [name] MRN: [id number] Date of Birth: 12-09-1950

## 2021-03-17 NOTE — Therapy (Signed)
[facility] [address] [place], [place], [address] Phone: [phone]   Fax:  [phone]  Physical Therapy Treatment  Patient Details  Name: [name] MRN: [id number] Date of Birth: December 08, 1950 Referring Provider (PT): Landis Gandy   Encounter Date: 03/17/2021   PT End of Session - 03/17/21 0934     Visit Number 8    Number of Visits 25    Date for PT Re-Evaluation 05/15/21    Authorization Type [employer] 25 visits 9/8-12/2/22    Authorization - Visit Number 8    Authorization - Number of Visits 25    Progress Note Due on Visit 10    PT Start Time 0931    PT Stop Time 1015    PT Time Calculation (min) 44 min    Equipment Utilized During Treatment --   slideboard   Activity Tolerance Patient tolerated treatment well    Behavior During Therapy [facility] for tasks assessed/performed             Past Medical History:  Diagnosis Date   CERVICAL POLYP 03/11/2008   Qualifier: Diagnosis of  By: Regis Bill MD, Standley Brooking    Colon polyps 2005   on colonscopy Dr. Fuller Plan   Fibroid 2004   Per Dr. Ouida Sills   History of shingles    face and mouth   Hx of skin cancer, basal cell    Rosacea    Sciatica of left side 09/28/2013   Scoliosis    noted on mri done for back pain    Past Surgical History:  Procedure Laterality Date   BUNIONECTOMY      There were no vitals filed for this visit.   Subjective Assessment - 03/17/21 0935     Subjective Pt denies any changes.    Patient is accompained by: Family member   husband, Darrick Penna   Pertinent History Pt also takes Toviaz 4mg  daily. PMH: hyperlipidemia, scoliosis, sleep disorder    Patient Stated Goals Pt would like to be able to walk even if its with assistance. She also wants to be able to type and improve her ability to do ADLs to allow for more independence.    Currently in Pain? Yes    Pain Score 4     Pain Location Arm    Pain Orientation  Right;Left    Pain Descriptors / Indicators Burning    Pain Type Neuropathic pain    Pain Onset More than a month ago    Pain Frequency Constant                               [facility] Adult PT Treatment/Exercise - 03/17/21 0935       Transfers   Transfers Lateral/Scoot Transfers    Lateral/Scoot Transfers 4: Min assist;4: Min guard;With armrests removed;With slide board    Lateral/Scoot Transfer Details (indicate cue type and reason) Performed with level transfer today. PT assisted to fully place board under bottom once pt got started then only helped to reposition feet when pt requested.      Neuro Re-ed    Neuro Re-ed Details  Sitting edge of mat: coming down on forearms x 5 each side. Pt transitioned from short sit to long sit with PT assisting legs on to mat. PT unweighted left heel to allow pt to abduct leg to move over on mat. Total assist to move right leg over. Pt was  able to support herself on arms during the process without falling back. In long sit: coming down on left forearm to side trying to return to upright long sit x 3 mod assist. Long sit trying to maintain upright with 1 UE support min assist with verbal cues to reach forward to try to get more anterior weight shift as loses balance posterior. Performed x 3 each side reaching with PT posterior. Then added physioball behind pt and had pt perform walking forwards with hands to try to maintain upright long sit and then walking back x 5.                       PT Short Term Goals - 02/20/21 1030       PT SHORT TERM GOAL #1   Title Pt will be able to perform initial HEP for stretching and strengthening with caregiver.    Time 4    Period Weeks    Status New    Target Date 03/20/21      PT SHORT TERM GOAL #2   Title Pt will be able to perform slideboard transfer on level surface supervision after no more than min assist to help with board placement.    Time 4    Period Weeks    Target Date  03/20/21      PT SHORT TERM GOAL #3   Title Pt will be able to perform rolling to each side supervision for improved bed mobility.    Time 4    Period Weeks    Status New    Target Date 03/20/21      PT SHORT TERM GOAL #4   Title Pt will be able to perform sit to/from supine transfer mod assist for improved bed mobility.    Time 4    Period Weeks    Status New    Target Date 03/20/21      PT SHORT TERM GOAL #5   Title Pt will be able to maintain sitting balance edge of mat with only feet supported x 5 min while performing UE movements for improved sitting balance/trunk stability to assist with sitting ADLs.    Time 4    Period Weeks    Status New    Target Date 03/20/21               PT Long Term Goals - 02/20/21 1035       PT LONG TERM GOAL #1   Title Pt will be able to perform progressive HEP for strengthening, stretching and balance to continue gains on own. (LTGs due 05/15/21)    Time 12    Period Weeks    Status New    Target Date 05/15/21      PT LONG TERM GOAL #2   Title Pt will be able to perform squat pivot transfer/lateral scoot transfer without slideboard CGA for improved mobility.    Time 12    Period Weeks    Status New    Target Date 05/15/21      PT LONG TERM GOAL #3   Title Pt will be able to stand at counter x 2 min min assist for improved standing ability.    Time 12    Period Weeks    Status New    Target Date 05/15/21      PT LONG TERM GOAL #4   Title Pt will be able to perform all bed mobility CGA for improved function.  Time 12    Period Weeks    Status New    Target Date 05/15/21      PT LONG TERM GOAL #5   Title Pt will report being able to perform 20 minutes of manual w/c propulsion around home for improved UE strength and mobility. (should be getting manual chair soon)    Time 12    Period Weeks    Status New    Target Date 05/15/21                   Plan - 03/17/21 1846     Clinical Impression Statement Pt is  challenged with maintaining balance in long sit compared to short sit. Continued to work on UE functional strengthening.    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navy officer Evolving/Moderate complexity    Rehab Potential Good    PT Frequency 2x / week   plus eval   PT Duration 12 weeks    PT Treatment/Interventions ADLs/Self Care Home Management;Electrical Stimulation;DME Instruction;Neuromuscular re-education;Manual techniques;Therapeutic exercise;Balance training;Therapeutic activities;Cryotherapy;Moist Heat;Functional mobility training;Stair training;Gait training;Patient/family education;Orthotic Fit/Training;Wheelchair mobility training;Dry needling;Passive range of motion;Vestibular    PT Next Visit Plan Check STGs. Try SciFit.  Slideboard transfer training working towards more level transfer, bed mobility especially side > sit. Continue to work on functional strengthening for UE, prone position for scapular strengthening. Short sit balance activities to try to engage core some. Work in long sit trying to move legs. At some point may try bioness to try to get more right quad activation and standing frame. Pt does have standing frame at home.    Consulted and Agree with Plan of Care Patient;Family member/caregiver    Family Member Consulted husband, [name]             Patient will benefit from skilled therapeutic intervention in order to improve the following deficits and impairments:  Decreased balance, Decreased mobility, Decreased strength, Impaired sensation, Postural dysfunction, Impaired flexibility, Impaired UE functional use, Impaired tone, Decreased range of motion  Visit Diagnosis: Muscle weakness  (generalized)  Quadriplegia, C5-C7 complete [facility])     Problem List Patient Active Problem List   Diagnosis Date Noted   Quadriplegia, C5-C7 incomplete ([facility]) 01/16/2021   History of spinal fracture 01/16/2021   Suprapubic catheter ([place]) 01/16/2021   Encounter for routine gynecological examination 09/28/2013   Onychomycosis 09/28/2013   Foot deformity, acquired 03/26/2012   Encounter for preventive health examination 12/25/2010   ROSACEA 08/25/2009   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 [place], [place], [address] Phone: [phone]   Fax:  [phone]  Name: Leannah Guse MRN: [id number] Date of Birth: [birth year]

## 2021-03-19 ENCOUNTER — Other Ambulatory Visit: Payer: Self-pay

## 2021-03-19 ENCOUNTER — Ambulatory Visit: Payer: Medicare PPO | Admitting: Physical Therapy

## 2021-03-19 ENCOUNTER — Ambulatory Visit: Payer: Medicare PPO | Admitting: Occupational Therapy

## 2021-03-19 ENCOUNTER — Encounter: Payer: Self-pay | Admitting: Occupational Therapy

## 2021-03-19 DIAGNOSIS — R29818 Other symptoms and signs involving the nervous system: Secondary | ICD-10-CM

## 2021-03-19 DIAGNOSIS — M6281 Muscle weakness (generalized): Secondary | ICD-10-CM

## 2021-03-19 DIAGNOSIS — G8254 Quadriplegia, C5-C7 incomplete: Secondary | ICD-10-CM | POA: Diagnosis not present

## 2021-03-19 DIAGNOSIS — R278 Other lack of coordination: Secondary | ICD-10-CM | POA: Diagnosis not present

## 2021-03-19 DIAGNOSIS — G8253 Quadriplegia, C5-C7 complete: Secondary | ICD-10-CM | POA: Diagnosis not present

## 2021-03-19 DIAGNOSIS — R2689 Other abnormalities of gait and mobility: Secondary | ICD-10-CM | POA: Diagnosis not present

## 2021-03-19 DIAGNOSIS — R293 Abnormal posture: Secondary | ICD-10-CM | POA: Diagnosis not present

## 2021-03-19 DIAGNOSIS — R208 Other disturbances of skin sensation: Secondary | ICD-10-CM | POA: Diagnosis not present

## 2021-03-19 NOTE — Therapy (Signed)
[facility] [address] [place], [place], [address] Phone: [phone]   Fax:  [phone]  Physical Therapy Treatment  Patient Details  Name: [name] MRN: [id number] Date of Birth: Jun 02, 1951 Referring Provider (PT): Landis Gandy   Encounter Date: 03/19/2021   PT End of Session - 03/19/21 1901     Visit Number 9    Number of Visits 25    Date for PT Re-Evaluation 05/15/21    Authorization Type human[employer] medicare 25 visits 9/8-12/2/22    Authorization - Visit Number 9    Authorization - Number of Visits 25    Progress Note Due on Visit 10    PT Start Time 1150    PT Stop Time 1232    PT Time Calculation (min) 42 min    Equipment Utilized During Treatment Other (comment)   slideboard   Activity Tolerance Patient tolerated treatment well    Behavior During Therapy [facility] for tasks assessed/performed             Past Medical History:  Diagnosis Date   CERVICAL POLYP 03/11/2008   Qualifier: Diagnosis of  By: Regis Bill MD, Standley Brooking    Colon polyps 2005   on colonscopy Dr. Fuller Plan   Fibroid 2004   Per Dr. Ouida Sills   History of shingles    face and mouth   Hx of skin cancer, basal cell    Rosacea    Sciatica of left side 09/28/2013   Scoliosis    noted on mri done for back pain    Past Surgical History:  Procedure Laterality Date   BUNIONECTOMY      There were no vitals filed for this visit.   Subjective Assessment - 03/19/21 1152     Subjective Pt denies changes or problems since last PT session; pt states she is going to start weairng her gloves more consistently    Patient is accompained by: Family member   husband, Darrick Penna   Pertinent History Pt also takes Toviaz 39m daily. PMH: hyperlipidemia, scoliosis, sleep disorder    Patient Stated Goals Pt would like to be able to walk even if its with assistance. She also wants to be able to type and improve her ability to do ADLs to allow for more  independence.    Currently in Pain? Yes    Pain Score 4     Pain Location Arm    Pain Orientation Right;Left    Pain Descriptors / Indicators Burning    Pain Type Neuropathic pain    Pain Onset More than a month ago    Pain Frequency Constant                               [facility] Adult PT Treatment/Exercise - 03/19/21 1218       Bed Mobility   Bed Mobility Rolling Right;Rolling Left;Supine to Sit;Sit to Supine    Rolling Right Minimal Assistance - Patient > 75%;Contact Guard/Touching assist;Supervision/verbal cueing   level of assistance varied depending on fatigue; pt performed rolling supine to Rt side 5 reps with rep #3 pt performing with no assistance; pt uses UE's for momentum to initiale rolling;  LLE was positioned in flexion   Rolling Left Supervision/Verbal cueing;Set up assist   RLE positioned in flexion; pt used UE's (swinging in horizontal abdct/adduction)  to achieve momentum to initiate rolling   Supine to Sit Moderate Assistance - Patient  50-74%   from supine to long sitting   Sit to Supine Moderate Assistance - Patient 50-74%   pt needs assist to transfer LE's onto mat     Transfers   Transfers Lateral/Scoot Transfers    Lateral/Scoot Transfers 4: Min assist;4: Min guard;With armrests removed;With slide board   mat table raised to be level with wheelchair at end of session  with mat to wheelchair transfer; min guard after sliding board placed in position under Rt hip   Number of Reps Other reps (comment)   2 reps - w/c to mat & mat to w/c   Comments Pt is dependent for sliding board placement, but able to transfer wheelchair to mat with SBA with assist moving feet off footplate to floor      Neuro Re-ed    Neuro Re-ed Details  Pt performed unsupported sitting balance activity for STG #5 assessment; pt performed holding medium sized ball and moving up/down and small range forward/back with SBA for safety for UE dynamic movements; also performed reaching  on Rt/LT sides for a washcloth and transferring to opposite side for improved sitting balance with trunk rotation with UE movement incorporated - pt sat for 5" with feet supported on floor            Pt transferred from supine to long sitting by hooking her LUE onto PT's arm and pulling up - needed mod assist  Lean forward into flexion to maintain balance; passive stretching of hamstrings in long sitting approx. 20 secs x 2 reps;   pt performed leaning down onto each forearm 3 reps each side with return to sitting with min to mod assist  (> assist needed from Rt side than from Lt side)           PT Short Term Goals - 03/19/21 1153       PT SHORT TERM GOAL #1   Title Pt will be able to perform initial HEP for stretching and strengthening with caregiver.    Baseline pt reports "we are starting to work on them"    Time 4    Period Weeks    Status On-going    Target Date 03/20/21      PT SHORT TERM GOAL #2   Title Pt will be able to perform slideboard transfer on level surface supervision after no more than min assist to help with board placement.    Baseline Pt required max assist for board placement in wheelchair and on mat -  03-19-21    Time 4    Period Weeks    Status Partially Met    Target Date 03/20/21      PT SHORT TERM GOAL #3   Title Pt will be able to perform rolling to each side supervision for improved bed mobility.    Baseline Pt able to roll to Lt side with supervision after RLE positioned in flexion; needed CGA to min assist to roll toward Rt side, needs assist to fully roll Lt pelvis from supine to sidelying; assist varies depending on fatigue; needs max assist to position each leg in flexion - 03-19-21    Time 4    Period Weeks    Status Not Met    Target Date 03/20/21      PT SHORT TERM GOAL #4   Title Pt will be able to perform sit to/from supine transfer mod assist for improved bed mobility.    Baseline needs assist to transfer LE's onto mat for sit  to  supine; able to hook LUE to pull up to sitting from supine to long sitting    Time 4    Period Weeks    Status Achieved    Target Date 03/20/21      PT SHORT TERM GOAL #5   Title Pt will be able to maintain sitting balance edge of mat with only feet supported x 5 min while performing UE movements for improved sitting balance/trunk stability to assist with sitting ADLs.    Baseline met 03-19-21    Time 4    Period Weeks    Status Achieved    Target Date 03/20/21               PT Long Term Goals - 03/19/21 1925       PT LONG TERM GOAL #1   Title Pt will be able to perform progressive HEP for strengthening, stretching and balance to continue gains on own. (LTGs due 05/15/21)    Time 12    Period Weeks    Status New      PT LONG TERM GOAL #2   Title Pt will be able to perform squat pivot transfer/lateral scoot transfer without slideboard CGA for improved mobility.    Time 12    Period Weeks    Status New      PT LONG TERM GOAL #3   Title Pt will be able to stand at counter x 2 min min assist for improved standing ability.    Time 12    Period Weeks    Status New      PT LONG TERM GOAL #4   Title Pt will be able to perform all bed mobility CGA for improved function.    Time 12    Period Weeks    Status New      PT LONG TERM GOAL #5   Title Pt will report being able to perform 20 minutes of manual w/c propulsion around home for improved UE strength and mobility. (should be getting manual chair soon)    Time 12    Period Weeks    Status New                   Plan - 03/19/21 1915     Clinical Impression Statement PT session focused on assessment of STG's; STG #1 is ongoing as pt states she "is starting to do the exercises at home". STG #2 is partially met as pt is able to transfer to level surface with supervision but needed max assist for placement of sliding board in today's session.  STG #3 is not met as pt needs assistance to position each leg in flexion  and needs min assist to fully roll Lt hip/pelvis onto Rt side (4 out of 5 reps); pt is able to roll onto Lt side with supervision after RLE is positioned in flexion.  STG's #4 & 5 are fully met.  Pt is progressing well; pt fatigued with repetitive rolling, resulting in varying need for assistance in today's session.    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navy officer Evolving/Moderate complexity    Rehab Potential Good    PT Frequency 2x / week   plus eval   PT Duration 12 weeks    PT Treatment/Interventions ADLs/Self Care Home Management;Electrical Stimulation;DME Instruction;Neuromuscular  re-education;Manual techniques;Therapeutic exercise;Balance training;Therapeutic activities;Cryotherapy;Moist Heat;Functional mobility training;Stair training;Gait training;Patient/family education;Orthotic Fit/Training;Wheelchair mobility training;Dry needling;Passive range of motion;Vestibular    PT Next Visit Plan 10th visit progress note;  Try SciFit.  Slideboard transfer training working towards more level transfer, bed mobility especially side > sit. Continue to work on functional strengthening for UE, prone position for scapular strengthening. Short sit balance activities to try to engage core some. Work in long sit trying to move legs. At some point may try bioness to try to get more right quad activation and standing frame. Pt does have standing frame at home.    Consulted and Agree with Plan of Care Patient;Family member/caregiver    Family Member Consulted husband, [name]             Patient will benefit from skilled therapeutic intervention in order to improve the following deficits and impairments:  Decreased balance, Decreased  mobility, Decreased strength, Impaired sensation, Postural dysfunction, Impaired flexibility, Impaired UE functional use, Impaired tone, Decreased range of motion  Visit Diagnosis: Quadriplegia, C5-C7 incomplete ([facility])  Muscle weakness (generalized)  Other abnormalities of gait and mobility     Problem List Patient Active Problem List   Diagnosis Date Noted   Quadriplegia, C5-C7 incomplete ([place]) 01/16/2021   History of spinal fracture 01/16/2021   Suprapubic catheter ([place]) 01/16/2021   Encounter for routine gynecological examination 09/28/2013   Onychomycosis 09/28/2013   Foot deformity, acquired 03/26/2012   Encounter for preventive health examination 12/25/2010   ROSACEA 08/25/2009   Disturbance in sleep behavior 03/11/2008   SKIN CANCER, HX OF 03/11/2008   DYSURIA, HX OF 03/11/2008   Hyperlipidemia 02/10/2007   CERVICALGIA 02/10/2007    Carole Doner, [place], PT 03/19/2021, 7:27 PM  [facility] [address] [place], [place], [address] Phone: [phone]   Fax:  [phone]  Name: [name] MRN: [id number] Date of Birth: [birth year]

## 2021-03-19 NOTE — Therapy (Signed)
[facility] [address] [place], [place], [address] Phone: [phone]   Fax:  [phone]  Occupational Therapy Treatment  Patient Details  Name: [name] MRN: [id number] Date of Birth: [birth year] Referring Provider (OT): Ina Homes, MD   Encounter Date: 03/19/2021   OT End of Session - 03/19/21 1235     Visit Number 3    Number of Visits 25    Date for OT Re-Evaluation 06/05/21    Authorization Type [employer]    Authorization Time Period Auth Req'd    OT Start Time 1233    OT Stop Time 1315    OT Time Calculation (min) 42 min    Activity Tolerance Patient tolerated treatment well    Behavior During Therapy [facility] for tasks assessed/performed             Past Medical History:  Diagnosis Date   CERVICAL POLYP 03/11/2008   Qualifier: Diagnosis of  By: Regis Bill MD, Standley Brooking    Colon polyps 2005   on colonscopy Dr. Fuller Plan   Fibroid 2004   Per Dr. Ouida Sills   History of shingles    face and mouth   Hx of skin cancer, basal cell    Rosacea    Sciatica of left side 09/28/2013   Scoliosis    noted on mri done for back pain    Past Surgical History:  Procedure Laterality Date   BUNIONECTOMY      There were no vitals filed for this visit.   Subjective Assessment - 03/19/21 1233     Subjective  the same ongoing nerve pain    Patient is accompanied by: Family member   husband, [name]   Pertinent History hyperlipidemia, scoliosis, sleep disorder    Patient Stated Goals regain arm strength for rolling over in bed and fine motor coordination for increasing typing    Currently in Pain? Yes    Pain Score 4     Pain Location Arm    Pain Orientation Right;Left    Pain Descriptors / Indicators Burning    Pain Type Neuropathic pain    Pain Onset More than a month ago    Pain Frequency Constant    Aggravating Factors  nothing    Pain Relieving Factors nothing                Flipping cards with R hand with min-mod difficulty.  Dealing cards with thumb with L hand with mod difficulty/cues.  Stacking checkers with R hand with mod-max difficulty, then with L hand with min-mod difficulty.  Attempting to manipulate checkers in L hand to translate to fingertips with max difficulty and cues.  Placing medium pegs in pegboard with L hand with min difficulty and with R hand with mod difficulty.  Placing clothespins with 1-6lb resist on vertical pole for functional reaching with min-mod difficulty with L hand, pt unable to open yellow clothespins with R hand.  Functional reaching to place small washers on vertical pole with mod difficulty R hand and min difficulty L hand.         OT Short Term Goals - 03/17/21 1124       OT SHORT TERM GOAL #1   Title Pt will be independent with HEP w CG assistance PRN    Time 4    Period Weeks    Status On-going    Target Date 04/13/21      OT SHORT TERM GOAL #2  Title Pt will verbalize understanding and report independence with CG assistance with ues of modalities at home with good safety.    Baseline has paraffin and Estim unit    Time 4    Period Weeks    Status New      OT SHORT TERM GOAL #3   Title Pt will increase Box and Blocks score with RUE to 30 blocks or greater    Baseline R 26 L 39    Time 4    Period Weeks    Status New      OT SHORT TERM GOAL #4   Title Pt will increase BUE tricep strength to 4/5 consistently for increasing ability to perform SB transfers with supervision/set up assistance only.    Baseline 3+/5 strength BUE    Time 4    Period Weeks    Status New      OT SHORT TERM GOAL #5   Title Pt will increase coordination in LUE to completing 9 hole peg test in 70 seconds or less.    Baseline L 75.13s, R unable    Time 4    Period Weeks    Status New      OT SHORT TERM GOAL #6   Title Pt will verbalize understanding of adapted strategies and/or equipment for increasing  indepenence with ADLs and IADLs (typing, bathing, cutting food, etc)    Baseline has U cuff    Time 4    Period Weeks    Status New               OT Long Term Goals - 03/16/21 0943       OT LONG TERM GOAL #1   Title Pt will be independence with any updated HEP    Time 12    Period Weeks    Status New    Target Date 06/08/21      OT LONG TERM GOAL #2   Title Pt will increase functional use of BUE evidenced by completing Box and Blocks with score of 35 or greater with RUE, 45 or greater with LUE.    Baseline R 26, L 39    Time 12    Period Weeks    Status New      OT LONG TERM GOAL #3   Title PT will improve grip strength in BUE by increasing grip strength to 10 lbs or greater with RUE and 20 lbs or greater with LUE.    Baseline R 1.5, L 12.3    Time 12    Period Weeks    Status New      OT LONG TERM GOAL #4   Title Pt will improve isolated finger movements in order to increase skill towards simple typing with adapted strategies and equipment PRN.    Baseline isolated movement in LUE    Time 12    Period Weeks    Status New      OT LONG TERM GOAL #5   Title Pt will improve 9 hole peg test in LUE to completing in 65 seconds or less in order to increase functional use and demonstrate ability to place 2 or more pegs with RUE.    Baseline L 75.13s, R unable    Time 12    Period Weeks    Status New      OT LONG TERM GOAL #6   Title Pt will report completing UB and LB dressing with decreased assistance consistently.  Baseline UB (reports able to do but not consistently doing), LB total A    Time 12    Period Weeks    Status New                   Plan - 03/19/21 1235     Clinical Impression Statement Pt is progressing towards goals with improving coordination.  Pt is beginning to isolate finger movement with L hand, but demo difficulty with in-hand manipulation.  Pt also  demo improving pinch with R hand, but it continues to be weak.    OT Occupational  Profile and History Detailed Assessment- Review of Records and additional review of physical, cognitive, psychosocial history related to current functional performance    Occupational performance deficits (Please refer to evaluation for details): ADL's;IADL's;Leisure;Work;Rest and Sleep    Body Structure / Function / Physical Skills ADL;IADL;ROM;Strength;Decreased knowledge of use of DME;Dexterity;GMC;Pain;Tone;UE functional use;Body mechanics;Balance;Continence;FMC;Muscle spasms;Skin integrity;Flexibility;Mobility;Sensation;Improper spinal/pelvic alignment;Endurance    Rehab Potential Good    Clinical Decision Making Several treatment options, min-mod task modification necessary    Comorbidities Affecting Occupational Performance: May have comorbidities impacting occupational performance    Modification or Assistance to Complete Evaluation  Min-Moderate modification of tasks or assist with assess necessary to complete eval    OT Frequency 2x / week    OT Duration 12 weeks    OT Treatment/Interventions Self-care/ADL training;Moist Heat;Fluidtherapy;DME and/or AE instruction;Splinting;Therapeutic activities;Aquatic Therapy;Ultrasound;Therapeutic exercise;Cognitive remediation/compensation;Passive range of motion;Functional Mobility Training;Neuromuscular education;Electrical Stimulation;Paraffin;Manual Therapy;Patient/family education    Plan if bring splints in--check fit and modify prn, supine shoulder ROM and exercises, wrist and tricep strengthening    Consulted and Agree with Plan of Care Patient;Family member/caregiver    Family Member Consulted spouse [name]             Patient will benefit from skilled therapeutic intervention in order to improve the following deficits and impairments:   Body Structure / Function / Physical Skills: ADL, IADL, ROM, Strength, Decreased knowledge of use of DME, Dexterity, GMC, Pain, Tone, UE functional use, Body mechanics, Balance, Continence, FMC, Muscle  spasms, Skin integrity, Flexibility, Mobility, Sensation, Improper spinal/pelvic alignment, Endurance       Visit Diagnosis: Muscle weakness (generalized)  Other lack of coordination  Other symptoms and signs involving the nervous system  Other disturbances of skin sensation    Problem List Patient Active Problem List   Diagnosis Date Noted   Quadriplegia, C5-C7 incomplete ([place]) 01/16/2021   History of spinal fracture 01/16/2021   Suprapubic catheter ([place]) 01/16/2021   Encounter for routine gynecological examination 09/28/2013   Onychomycosis 09/28/2013   Foot deformity, acquired 03/26/2012   Encounter for preventive health examination 12/25/2010   ROSACEA 08/25/2009   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 [place], [place]  [address] [phone] phone [phone] 03/19/21 1:42 PM

## 2021-03-20 DIAGNOSIS — N312 Flaccid neuropathic bladder, not elsewhere classified: Secondary | ICD-10-CM | POA: Diagnosis not present

## 2021-03-24 ENCOUNTER — Other Ambulatory Visit: Payer: Self-pay

## 2021-03-24 ENCOUNTER — Ambulatory Visit: Payer: Medicare PPO | Admitting: Occupational Therapy

## 2021-03-24 ENCOUNTER — Ambulatory Visit: Payer: Medicare PPO

## 2021-03-24 DIAGNOSIS — R2689 Other abnormalities of gait and mobility: Secondary | ICD-10-CM

## 2021-03-24 DIAGNOSIS — R208 Other disturbances of skin sensation: Secondary | ICD-10-CM | POA: Diagnosis not present

## 2021-03-24 DIAGNOSIS — G8254 Quadriplegia, C5-C7 incomplete: Secondary | ICD-10-CM

## 2021-03-24 DIAGNOSIS — R293 Abnormal posture: Secondary | ICD-10-CM | POA: Diagnosis not present

## 2021-03-24 DIAGNOSIS — M6281 Muscle weakness (generalized): Secondary | ICD-10-CM | POA: Diagnosis not present

## 2021-03-24 DIAGNOSIS — R278 Other lack of coordination: Secondary | ICD-10-CM

## 2021-03-24 DIAGNOSIS — G8253 Quadriplegia, C5-C7 complete: Secondary | ICD-10-CM | POA: Diagnosis not present

## 2021-03-24 DIAGNOSIS — R29818 Other symptoms and signs involving the nervous system: Secondary | ICD-10-CM | POA: Diagnosis not present

## 2021-03-24 NOTE — Therapy (Signed)
[facility] [address] [place], [place], [address] Phone: [phone]   Fax:  [phone]  Physical Therapy Treatment/Progress note  Patient Details  Name: [name] MRN: [id number] Date of Birth: [birth year] Referring Provider (PT): Landis Gandy    Progress Note  Reporting period 02/19/21 to 03/24/21  See Note below for Objective Data and Assessment of Progress/Goals  Encounter Date: 03/24/2021   PT End of Session - 03/24/21 1621     Visit Number 10    Number of Visits 25    Date for PT Re-Evaluation 05/15/21    Authorization Type [employer] 25 visits 9/8-12/2/22    Authorization - Visit Number 10    Authorization - Number of Visits 25    Progress Note Due on Visit 10    PT Start Time 1618    PT Stop Time 1708    PT Time Calculation (min) 50 min    Equipment Utilized During Treatment Other (comment)   slideboard   Activity Tolerance Patient tolerated treatment well    Behavior During Therapy [facility] for tasks assessed/performed             Past Medical History:  Diagnosis Date   CERVICAL POLYP 03/11/2008   Qualifier: Diagnosis of  By: Regis Bill MD, Standley Brooking    Colon polyps 2005   on colonscopy Dr. Fuller Plan   Fibroid 2004   Per Dr. Ouida Sills   History of shingles    face and mouth   Hx of skin cancer, basal cell    Rosacea    Sciatica of left side 09/28/2013   Scoliosis    noted on mri done for back pain    Past Surgical History:  Procedure Laterality Date   BUNIONECTOMY      There were no vitals filed for this visit.   Subjective Assessment - 03/24/21 1622     Subjective Pt reports hands and arms a little more painful.    Patient is accompained by: Family member   husband, [name]   Pertinent History Pt also takes Toviaz 54m daily. PMH: hyperlipidemia, scoliosis, sleep disorder    Patient Stated Goals Pt would like to be able to walk even if its with assistance. She also wants  to be able to type and improve her ability to do ADLs to allow for more independence.    Currently in Pain? Yes    Pain Score 5     Pain Location Arm    Pain Orientation Left;Right    Pain Descriptors / Indicators Burning;Shooting    Pain Type Neuropathic pain    Pain Onset More than a month ago                               [facility]       Transfers   Transfers Lateral/Scoot Transfers    Lateral/Scoot Transfers 4: Min guard;4: Min assist;With armrests removed;With slide board    Lateral/Scoot Transfer Details (indicate cue type and reason) Performed with level transfer today. PT assisted to fully place board under bottom once pt got started then only helped to reposition feet when pt requested. Pt required increased time to perform today having performed after SciFit      Neuro Re-ed    Neuro Re-ed Details  Sitting edge of mat with feet supported on floor: leaning back on arms performing tricep dips against her  bodyweight on mat x 10. Holding 1.1# med ball in front and bringing back in x 10 with cues to try to stay up tall close SBA/CGA. Holding 1.1# med ball in front performing trunk rotation x 10 each side again CGA. Pt's pelvis is posterior tilted in sitting.      Exercises   Exercises Other Exercises;Knee/Hip      Knee/Hip Exercises: Aerobic   Other Aerobic SciFit level 2, 4 min x 3 with active hand mitt on right. Performed from [place]. Rest breaks between each bout. HR=90 after. Performed for strengthening/ROM and aerobic activity.                       PT Short Term Goals - 03/24/21 2022       PT SHORT TERM GOAL #1   Title Pt will be able to perform initial HEP for stretching and strengthening with caregiver.    Baseline pt reports "we are starting to work on them"    Time 4    Period Weeks    Status On-going    Target Date 04/17/21      PT SHORT TERM GOAL #2   Title Pt will be able to  perform slideboard transfer on level surface supervision after no more than min assist to help with board placement.    Baseline Pt required max assist for board placement in wheelchair and on mat -  03-19-21    Time 4    Period Weeks    Status On-going    Target Date 04/17/21      PT SHORT TERM GOAL #3   Title Pt will be able to perform rolling to each side supervision for improved bed mobility.    Baseline Pt able to roll to Lt side with supervision after RLE positioned in flexion; needed CGA to min assist to roll toward Rt side, needs assist to fully roll Lt pelvis from supine to sidelying; assist varies depending on fatigue; needs max assist to position each leg in flexion - 03-19-21    Time 4    Period Weeks    Status On-going    Target Date 04/17/21      PT SHORT TERM GOAL #4   Title Pt will be able to perform sit to/from supine transfer mod assist for improved bed mobility.    Baseline needs assist to transfer LE's onto mat for sit to supine; able to hook LUE to pull up to sitting from supine to long sitting    Time 4    Period Weeks    Status Achieved    Target Date 03/20/21      PT SHORT TERM GOAL #5   Title Pt will be able to maintain sitting balance edge of mat with only feet supported x 5 min while performing UE movements for improved sitting balance/trunk stability to assist with sitting ADLs.    Baseline met 03-19-21    Time 4    Period Weeks    Status Achieved    Target Date 03/20/21               PT Long Term Goals - 03/19/21 1925       PT LONG TERM GOAL #1   Title Pt will be able to perform progressive HEP for strengthening, stretching and balance to continue gains on own. (LTGs due 05/15/21)    Time 12    Period Weeks    Status New  PT LONG TERM GOAL #2   Title Pt will be able to perform squat pivot transfer/lateral scoot transfer without slideboard CGA for improved mobility.    Time 12    Period Weeks    Status New      PT LONG TERM GOAL #3    Title Pt will be able to stand at counter x 2 min min assist for improved standing ability.    Time 12    Period Weeks    Status New      PT LONG TERM GOAL #4   Title Pt will be able to perform all bed mobility CGA for improved function.    Time 12    Period Weeks    Status New      PT LONG TERM GOAL #5   Title Pt will report being able to perform 20 minutes of manual w/c propulsion around home for improved UE strength and mobility. (should be getting manual chair soon)    Time 12    Period Weeks    Status New                   Plan - 03/24/21 2024     Clinical Impression Statement Pt has met 2 out of 5 STGs with progress towards others. She is requiring decreased assistance with slideboard transfers and bed mobility. Pt was able to try SciFit for the first time today and tolelrated well with performing 4 minute intervals. PT did not have to physically assist right leg to stay in place. Pt continues to benefit from skilled PT to further progress strength, ROM and functional mobility.    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[employer] officer Evolving/Moderate complexity    Rehab Potential Good    PT Frequency 2x / week   plus eval   PT Duration 12 weeks    PT Treatment/Interventions ADLs/Self Care Home Management;Electrical Stimulation;DME Instruction;Neuromuscular re-education;Manual techniques;Therapeutic exercise;Balance training;Therapeutic activities;Cryotherapy;Moist Heat;Functional mobility training;Stair training;Gait training;Patient/family education;Orthotic Fit/Training;Wheelchair mobility training;Dry needling;Passive range of motion;Vestibular    PT Next Visit Plan Continue SciFit with training rehab tech  next visit so can try to have pt come early or stay late to perform with tech assist.  Slideboard transfer training working towards more level transfer, bed mobility especially side > sit. Continue to work on functional strengthening for UE, prone position for scapular strengthening. Short sit balance activities to try to engage core some. Work in long sit trying to move legs. At some point may try bioness to try to get more right quad activation and standing frame. Pt does have standing frame at home.    Consulted and Agree with Plan of Care Patient;Family member/caregiver    Family Member Consulted husband, [name]             Patient will benefit from skilled therapeutic intervention in order to improve the following deficits and impairments:  Decreased balance, Decreased mobility, Decreased strength, Impaired sensation, Postural dysfunction, Impaired flexibility, Impaired UE functional use, Impaired tone, Decreased range of motion  Visit Diagnosis: Quadriplegia, C5-C7 incomplete ([facility])  Muscle weakness (generalized)     Problem List Patient Active Problem List   Diagnosis Date Noted   Quadriplegia, C5-C7 incomplete ([place]) 01/16/2021   History of spinal fracture 01/16/2021   Suprapubic catheter ([place]) 01/16/2021   Encounter for routine gynecological  examination 09/28/2013   Onychomycosis 09/28/2013   Foot deformity, acquired 03/26/2012   Encounter for preventive health examination 12/25/2010   ROSACEA 08/25/2009   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

## 2021-03-24 NOTE — Therapy (Signed)
[facility] [address] [place], [place], [address] Phone: [phone]   Fax:  [phone]  Occupational Therapy Treatment  Patient Details  Name: [name] MRN: [id number] Date of Birth: [birth year] Referring Provider (OT): Ina Homes, MD   Encounter Date: 03/24/2021   OT End of Session - 03/24/21 1019     Visit Number 4    Number of Visits 25    Date for OT Re-Evaluation 06/05/21    Authorization Type [employer]    Authorization Time Period Auth Req'd    OT Start Time 1018    OT Stop Time 1100    OT Time Calculation (min) 42 min    Activity Tolerance Patient tolerated treatment well    Behavior During Therapy [facility] for tasks assessed/performed             Past Medical History:  Diagnosis Date   CERVICAL POLYP 03/11/2008   Qualifier: Diagnosis of  By: Regis Bill MD, Standley Brooking    Colon polyps 2005   on colonscopy Dr. Fuller Plan   Fibroid 2004   Per Dr. Ouida Sills   History of shingles    face and mouth   Hx of skin cancer, basal cell    Rosacea    Sciatica of left side 09/28/2013   Scoliosis    noted on mri done for back pain    Past Surgical History:  Procedure Laterality Date   BUNIONECTOMY      There were no vitals filed for this visit.   Subjective Assessment - 03/24/21 1020     Subjective  "I'm having bad pain in my arms and to my hands (shooting)"    Patient is accompanied by: Family member   husband, [name]   Pertinent History hyperlipidemia, scoliosis, sleep disorder    Patient Stated Goals regain arm strength for rolling over in bed and fine motor coordination for increasing typing    Currently in Pain? Yes    Pain Score 6     Pain Location Arm    Pain Orientation Left;Right    Pain Descriptors / Indicators Burning;Shooting    Pain Type Neuropathic pain    Pain Onset More than a month ago    Pain Frequency Constant    Aggravating Factors  nothing    Pain  Relieving Factors nothing               Checked current custom resting hand splints -good fit and serve purpose well.  Medium Pegs with LUE with mod drops and difficulty with transitioning pegs but did well with with mod difficulty. Copied pattern with min difficulty. Removed with RUE with min difficulty.   Isometrics  for wrist extension and flexion BUE - no resistance for wrist flexion in RUE therefore did AROM x 12 reps  Stacking Jenga pieces with BUE - L > R with coordination and manipulation of blocks. Worked on isolating fingers with LUE for pulling pieces out and stabilizing with RUE                OT Short Term Goals - 03/17/21 1124       OT SHORT TERM GOAL #1   Title Pt will be independent with HEP w CG assistance PRN    Time 4    Period Weeks    Status On-going    Target Date 04/13/21      OT SHORT TERM GOAL #2   Title Pt will verbalize understanding and report  independence with CG assistance with ues of modalities at home with good safety.    Baseline has paraffin and Estim unit    Time 4    Period Weeks    Status New      OT SHORT TERM GOAL #3   Title Pt will increase Box and Blocks score with RUE to 30 blocks or greater    Baseline R 26 L 39    Time 4    Period Weeks    Status New      OT SHORT TERM GOAL #4   Title Pt will increase BUE tricep strength to 4/5 consistently for increasing ability to perform SB transfers with supervision/set up assistance only.    Baseline 3+/5 strength BUE    Time 4    Period Weeks    Status New      OT SHORT TERM GOAL #5   Title Pt will increase coordination in LUE to completing 9 hole peg test in 70 seconds or less.    Baseline L 75.13s, R unable    Time 4    Period Weeks    Status New      OT SHORT TERM GOAL #6   Title Pt will verbalize understanding of adapted strategies and/or equipment for increasing indepenence with ADLs and IADLs (typing, bathing, cutting food, etc)    Baseline has U cuff     Time 4    Period Weeks    Status New               OT Long Term Goals - 03/16/21 0943       OT LONG TERM GOAL #1   Title Pt will be independence with any updated HEP    Time 12    Period Weeks    Status New    Target Date 06/08/21      OT LONG TERM GOAL #2   Title Pt will increase functional use of BUE evidenced by completing Box and Blocks with score of 35 or greater with RUE, 45 or greater with LUE.    Baseline R 26, L 39    Time 12    Period Weeks    Status New      OT LONG TERM GOAL #3   Title PT will improve grip strength in BUE by increasing grip strength to 10 lbs or greater with RUE and 20 lbs or greater with LUE.    Baseline R 1.5, L 12.3    Time 12    Period Weeks    Status New      OT LONG TERM GOAL #4   Title Pt will improve isolated finger movements in order to increase skill towards simple typing with adapted strategies and equipment PRN.    Baseline isolated movement in LUE    Time 12    Period Weeks    Status New      OT LONG TERM GOAL #5   Title Pt will improve 9 hole peg test in LUE to completing in 65 seconds or less in order to increase functional use and demonstrate ability to place 2 or more pegs with RUE.    Baseline L 75.13s, R unable    Time 12    Period Weeks    Status New      OT LONG TERM GOAL #6   Title Pt will report completing UB and LB dressing with decreased assistance consistently.    Baseline UB (reports able to do but  not consistently doing), LB total A    Time 12    Period Weeks    Status New                   Plan - 03/24/21 1053     Clinical Impression Statement Pt continues to progress towards improved strength and coordination with BUE.    OT Occupational Profile and History Detailed Assessment- Review of Records and additional review of physical, cognitive, psychosocial history related to current functional performance    Occupational performance deficits (Please refer to evaluation for details):  ADL's;IADL's;Leisure;Work;Rest and Sleep    Body Structure / Function / Physical Skills ADL;IADL;ROM;Strength;Decreased knowledge of use of DME;Dexterity;GMC;Pain;Tone;UE functional use;Body mechanics;Balance;Continence;FMC;Muscle spasms;Skin integrity;Flexibility;Mobility;Sensation;Improper spinal/pelvic alignment;Endurance    Rehab Potential Good    Clinical Decision Making Several treatment options, min-mod task modification necessary    Comorbidities Affecting Occupational Performance: May have comorbidities impacting occupational performance    Modification or Assistance to Complete Evaluation  Min-Moderate modification of tasks or assist with assess necessary to complete eval    OT Frequency 2x / week    OT Duration 12 weeks    OT Treatment/Interventions Self-care/ADL training;Moist Heat;Fluidtherapy;DME and/or AE instruction;Splinting;Therapeutic activities;Aquatic Therapy;Ultrasound;Therapeutic exercise;Cognitive remediation/compensation;Passive range of motion;Functional Mobility Training;Neuromuscular education;Electrical Stimulation;Paraffin;Manual Therapy;Patient/family education    Plan supine shoulder ROM and exercises, wrist and tricep strengthening, coordination BUE, in hand manipulation / rotation of items in hand    Consulted and Agree with Plan of Care Patient;Family member/caregiver    Family Member Consulted spouse [name]             Patient will benefit from skilled therapeutic intervention in order to improve the following deficits and impairments:   Body Structure / Function / Physical Skills: ADL, IADL, ROM, Strength, Decreased knowledge of use of DME, Dexterity, GMC, Pain, Tone, UE functional use, Body mechanics, Balance, Continence, FMC, Muscle spasms, Skin integrity, Flexibility, Mobility, Sensation, Improper spinal/pelvic alignment, Endurance       Visit Diagnosis: Muscle weakness (generalized)  Other lack of coordination  Other symptoms and signs involving  the nervous system  Other disturbances of skin sensation  Quadriplegia, C5-C7 incomplete (HCC)  Other abnormalities of gait and mobility    Problem List Patient Active Problem List   Diagnosis Date Noted   Quadriplegia, C5-C7 incomplete ([place]) 01/16/2021   History of spinal fracture 01/16/2021   Suprapubic catheter ([place]) 01/16/2021   Encounter for routine gynecological examination 09/28/2013   Onychomycosis 09/28/2013   Foot deformity, acquired 03/26/2012   Encounter for preventive health examination 12/25/2010   ROSACEA 08/25/2009   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 [place], [place], [address] Phone: [phone]   Fax:  [phone]  Name: [name] MRN: [id number] Date of Birth: 1951/04/20

## 2021-03-26 NOTE — Telephone Encounter (Signed)
Please  make a rx for  the request   for medical reasons  She has  medical diagnosis quadriplegia [facility]

## 2021-03-27 ENCOUNTER — Other Ambulatory Visit: Payer: Self-pay

## 2021-03-27 ENCOUNTER — Ambulatory Visit: Payer: Medicare PPO

## 2021-03-27 DIAGNOSIS — R293 Abnormal posture: Secondary | ICD-10-CM | POA: Diagnosis not present

## 2021-03-27 DIAGNOSIS — R278 Other lack of coordination: Secondary | ICD-10-CM | POA: Diagnosis not present

## 2021-03-27 DIAGNOSIS — R2689 Other abnormalities of gait and mobility: Secondary | ICD-10-CM | POA: Diagnosis not present

## 2021-03-27 DIAGNOSIS — R29818 Other symptoms and signs involving the nervous system: Secondary | ICD-10-CM | POA: Diagnosis not present

## 2021-03-27 DIAGNOSIS — R208 Other disturbances of skin sensation: Secondary | ICD-10-CM | POA: Diagnosis not present

## 2021-03-27 DIAGNOSIS — G8254 Quadriplegia, C5-C7 incomplete: Secondary | ICD-10-CM | POA: Diagnosis not present

## 2021-03-27 DIAGNOSIS — M6281 Muscle weakness (generalized): Secondary | ICD-10-CM | POA: Diagnosis not present

## 2021-03-27 DIAGNOSIS — G8253 Quadriplegia, C5-C7 complete: Secondary | ICD-10-CM | POA: Diagnosis not present

## 2021-03-27 NOTE — Therapy (Signed)
[facility] [address] [place], [place], [address] Phone: [phone]   Fax:  [phone]  Physical Therapy Treatment  Patient Details  Name: [name] MRN: [id number] Date of Birth: December 15, 1950 Referring Provider (PT): Landis Gandy   Encounter Date: 03/27/2021   PT End of Session - 03/27/21 1107     Visit Number 11    Number of Visits 25    Date for PT Re-Evaluation 05/15/21    Authorization Type [employer] 25 visits 9/8-12/2/22    Authorization - Visit Number 11    Authorization - Number of Visits 25    Progress Note Due on Visit 10    PT Start Time 1103    PT Stop Time 1143    PT Time Calculation (min) 40 min    Equipment Utilized During Treatment Other (comment)   slideboard   Activity Tolerance Patient tolerated treatment well    Behavior During Therapy [facility] for tasks assessed/performed             Past Medical History:  Diagnosis Date   CERVICAL POLYP 03/11/2008   Qualifier: Diagnosis of  By: Regis Bill MD, Standley Brooking    Colon polyps 2005   on colonscopy Dr. Fuller Plan   Fibroid 2004   Per Dr. Ouida Sills   History of shingles    face and mouth   Hx of skin cancer, basal cell    Rosacea    Sciatica of left side 09/28/2013   Scoliosis    noted on mri done for back pain    Past Surgical History:  Procedure Laterality Date   BUNIONECTOMY      There were no vitals filed for this visit.   Subjective Assessment - 03/27/21 1116     Subjective Pt denies any pain from last session. She was just tired.    Patient is accompained by: Family member   husband, [name]   Pertinent History Pt also takes Toviaz 63m daily. PMH: hyperlipidemia, scoliosis, sleep disorder    Patient Stated Goals Pt would like to be able to walk even if its with assistance. She also wants to be able to type and improve her ability to do ADLs to allow for more independence.    Currently in Pain? Yes    Pain Score 4      Pain Location Arm    Pain Orientation Right;Left    Pain Descriptors / Indicators Burning;Shooting    Pain Type Neuropathic pain    Pain Onset More than a month ago    Pain Frequency Constant                               [facility] Adult PT Treatment/Exercise - 03/27/21 1112       Transfers   Transfers Lateral/Scoot Transfers    Lateral/Scoot Transfers 4: Min guard;4: Min assist;With armrests removed;With slide board    Lateral/Scoot Transfer Details (indicate cue type and reason) Pt performed slideboard transfer to/from 20" mat x 2 each direction as mat tables that husband is looking at for home would be about 20" height. Pt's powerchair can only go down to 22" so needs to be able to go uphill slightly for return. With transfer powerchair to mat pt completed CGA after PT helped to placed slideboard. With return to chair pt needed min assist to start transfer on board. Othe than that pt just directed when to adjust feet. Pt  was cued to use head/hips movement to really help.      Exercises   Exercises Other Exercises    Other Exercises  Seated in powerchair: bilateral scapular retraction with red theraband 10 x 2. Tricep extension with red theraband resistance 10 x 2 bilateral with tactile cues to sit up tall and keep shoulder back.      Knee/Hip Exercises: Aerobic   Other Aerobic SciFit level 2, 5 min x 1 then 4 min x 1 with 1 minute rest break between with active hand mitt on right. Performed from [place]. HR=86 after. Performed for strengthening/ROM and aerobic activity.                       PT Short Term Goals - 03/24/21 2022       PT SHORT TERM GOAL #1   Title Pt will be able to perform initial HEP for stretching and strengthening with caregiver.    Baseline pt reports "we are starting to work on them"    Time 4    Period Weeks    Status On-going    Target Date 04/17/21      PT SHORT TERM GOAL #2   Title Pt will be able to perform  slideboard transfer on level surface supervision after no more than min assist to help with board placement.    Baseline Pt required max assist for board placement in wheelchair and on mat -  03-19-21    Time 4    Period Weeks    Status On-going    Target Date 04/17/21      PT SHORT TERM GOAL #3   Title Pt will be able to perform rolling to each side supervision for improved bed mobility.    Baseline Pt able to roll to Lt side with supervision after RLE positioned in flexion; needed CGA to min assist to roll toward Rt side, needs assist to fully roll Lt pelvis from supine to sidelying; assist varies depending on fatigue; needs max assist to position each leg in flexion - 03-19-21    Time 4    Period Weeks    Status On-going    Target Date 04/17/21      PT SHORT TERM GOAL #4   Title Pt will be able to perform sit to/from supine transfer mod assist for improved bed mobility.    Baseline needs assist to transfer LE's onto mat for sit to supine; able to hook LUE to pull up to sitting from supine to long sitting    Time 4    Period Weeks    Status Achieved    Target Date 03/20/21      PT SHORT TERM GOAL #5   Title Pt will be able to maintain sitting balance edge of mat with only feet supported x 5 min while performing UE movements for improved sitting balance/trunk stability to assist with sitting ADLs.    Baseline met 03-19-21    Time 4    Period Weeks    Status Achieved    Target Date 03/20/21               PT Long Term Goals - 03/19/21 1925       PT LONG TERM GOAL #1   Title Pt will be able to perform progressive HEP for strengthening, stretching and balance to continue gains on own. (LTGs due 05/15/21)    Time 12    Period Weeks    Status New  PT LONG TERM GOAL #2   Title Pt will be able to perform squat pivot transfer/lateral scoot transfer without slideboard CGA for improved mobility.    Time 12    Period Weeks    Status New      PT LONG TERM GOAL #3   Title Pt  will be able to stand at counter x 2 min min assist for improved standing ability.    Time 12    Period Weeks    Status New      PT LONG TERM GOAL #4   Title Pt will be able to perform all bed mobility CGA for improved function.    Time 12    Period Weeks    Status New      PT LONG TERM GOAL #5   Title Pt will report being able to perform 20 minutes of manual w/c propulsion around home for improved UE strength and mobility. (should be getting manual chair soon)    Time 12    Period Weeks    Status New                   Plan - 03/27/21 1957     Clinical Impression Statement Pt tolerating SciFit well after being set up. PT trained rehab tech to be able to assist pt on/off so that pt can come early to get some extra time on SciFit. Will begin next session. Pt was able to perform slideboard transfer from 20" mat min assist so advised husband that that transfer would work.    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[employer] officer Evolving/Moderate complexity    Rehab Potential Good    PT Frequency 2x / week   plus eval   PT Duration 12 weeks    PT Treatment/Interventions ADLs/Self Care Home Management;Electrical Stimulation;DME Instruction;Neuromuscular re-education;Manual techniques;Therapeutic exercise;Balance training;Therapeutic activities;Cryotherapy;Moist Heat;Functional mobility training;Stair training;Gait training;Patient/family education;Orthotic Fit/Training;Wheelchair mobility training;Dry needling;Passive range of motion;Vestibular    PT Next Visit Plan Continue SciFit with training rehab tech starting before session to get more aerobic activity and strengthening.  Slideboard transfer training working towards  more level transfer, bed mobility especially side > sit. Continue to work on functional strengthening for UE, prone position for scapular strengthening. Short sit balance activities to try to engage core some. Work in long sit trying to move legs. At some point may try bioness to try to get more right quad activation and standing frame. Pt does have standing frame at home.    Consulted and Agree with Plan of Care Patient;Family member/caregiver    Family Member Consulted husband, [name]             Patient will benefit from skilled therapeutic intervention in order to improve the following deficits and impairments:  Decreased balance, Decreased mobility, Decreased strength, Impaired sensation, Postural dysfunction, Impaired flexibility, Impaired UE functional use, Impaired tone, Decreased range of motion  Visit Diagnosis: Muscle weakness (generalized)  Quadriplegia, C5-C7 incomplete ([place])     Problem List Patient Active Problem List   Diagnosis Date Noted   Quadriplegia, C5-C7 incomplete ([place]) 01/16/2021   History of spinal fracture 01/16/2021   Suprapubic catheter ([place]) 01/16/2021   Encounter for routine gynecological examination 09/28/2013   Onychomycosis 09/28/2013   Foot deformity, acquired 03/26/2012   Encounter for preventive health examination 12/25/2010  ROSACEA 08/25/2009   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 [place], [place], [address] Phone: [phone]   Fax:  [phone]  Name: [name] MRN: [id number] Date of Birth: 04-Oct-1950

## 2021-03-30 ENCOUNTER — Ambulatory Visit: Payer: Medicare PPO | Admitting: Occupational Therapy

## 2021-03-30 ENCOUNTER — Other Ambulatory Visit: Payer: Self-pay

## 2021-03-30 ENCOUNTER — Ambulatory Visit: Payer: Medicare PPO

## 2021-03-30 ENCOUNTER — Encounter: Payer: Self-pay | Admitting: Occupational Therapy

## 2021-03-30 DIAGNOSIS — R29818 Other symptoms and signs involving the nervous system: Secondary | ICD-10-CM

## 2021-03-30 DIAGNOSIS — R278 Other lack of coordination: Secondary | ICD-10-CM

## 2021-03-30 DIAGNOSIS — R208 Other disturbances of skin sensation: Secondary | ICD-10-CM | POA: Diagnosis not present

## 2021-03-30 DIAGNOSIS — G8254 Quadriplegia, C5-C7 incomplete: Secondary | ICD-10-CM

## 2021-03-30 DIAGNOSIS — R2689 Other abnormalities of gait and mobility: Secondary | ICD-10-CM

## 2021-03-30 DIAGNOSIS — M6281 Muscle weakness (generalized): Secondary | ICD-10-CM

## 2021-03-30 DIAGNOSIS — R293 Abnormal posture: Secondary | ICD-10-CM | POA: Diagnosis not present

## 2021-03-30 DIAGNOSIS — G8253 Quadriplegia, C5-C7 complete: Secondary | ICD-10-CM | POA: Diagnosis not present

## 2021-03-30 NOTE — Therapy (Signed)
[facility] [address] [place], [place], [address] Phone: [phone]   Fax:  [phone]  Occupational Therapy Treatment  Patient Details  Name: [name] MRN: [id number] Date of Birth: [birth year] Referring Provider (OT): Ina Homes, MD   Encounter Date: 03/30/2021   OT End of Session - 03/30/21 1616     Visit Number 5    Number of Visits 25    Date for OT Re-Evaluation 06/05/21    Authorization Type [employer]    Authorization Time Period Auth Req'd    OT Start Time 1616    OT Stop Time 1700    OT Time Calculation (min) 44 min    Activity Tolerance Patient tolerated treatment well    Behavior During Therapy [facility] for tasks assessed/performed             Past Medical History:  Diagnosis Date   CERVICAL POLYP 03/11/2008   Qualifier: Diagnosis of  By: Regis Bill MD, Standley Brooking    Colon polyps 2005   on colonscopy Dr. Fuller Plan   Fibroid 2004   Per Dr. Ouida Sills   History of shingles    face and mouth   Hx of skin cancer, basal cell    Rosacea    Sciatica of left side 09/28/2013   Scoliosis    noted on mri done for back pain    Past Surgical History:  Procedure Laterality Date   BUNIONECTOMY      There were no vitals filed for this visit.   Subjective Assessment - 03/30/21 1617     Subjective  "it was hard and it was good" (a/b PT) - "Normal arm and hand pain"    Patient is accompanied by: Family member   husband, [name]   Pertinent History hyperlipidemia, scoliosis, sleep disorder    Patient Stated Goals regain arm strength for rolling over in bed and fine motor coordination for increasing typing    Currently in Pain? Yes    Pain Score 4     Pain Location Arm    Pain Orientation Right;Left    Pain Descriptors / Indicators Burning;Shooting    Pain Type Neuropathic pain    Pain Onset More than a month ago    Pain Frequency Constant    Aggravating Factors  nothing    Pain  Relieving Factors nothing             Stringing Beads for bimanual coordination.  Grooved Pegs with LUE with placing with mod/max difficulty and drops and remove with RUE with max difficulty  RUE AROM wrist extension,flexion and supination/pronation followed by isometrics, LUE Strengthening x 10 reps with 1 lb dumbbell, BUE Strengthening with 1.1 lb medball x bicep curls and shoulder flexion (low level) x 10 reps                     OT Short Term Goals - 03/17/21 1124       OT SHORT TERM GOAL #1   Title Pt will be independent with HEP w CG assistance PRN    Time 4    Period Weeks    Status On-going    Target Date 04/13/21      OT SHORT TERM GOAL #2   Title Pt will verbalize understanding and report independence with CG assistance with ues of modalities at home with good safety.    Baseline has paraffin and Estim unit    Time 4    Period  Weeks    Status New      OT SHORT TERM GOAL #3   Title Pt will increase Box and Blocks score with RUE to 30 blocks or greater    Baseline R 26 L 39    Time 4    Period Weeks    Status New      OT SHORT TERM GOAL #4   Title Pt will increase BUE tricep strength to 4/5 consistently for increasing ability to perform SB transfers with supervision/set up assistance only.    Baseline 3+/5 strength BUE    Time 4    Period Weeks    Status New      OT SHORT TERM GOAL #5   Title Pt will increase coordination in LUE to completing 9 hole peg test in 70 seconds or less.    Baseline L 75.13s, R unable    Time 4    Period Weeks    Status New      OT SHORT TERM GOAL #6   Title Pt will verbalize understanding of adapted strategies and/or equipment for increasing indepenence with ADLs and IADLs (typing, bathing, cutting food, etc)    Baseline has U cuff    Time 4    Period Weeks    Status New               OT Long Term Goals - 03/16/21 0943       OT LONG TERM GOAL #1   Title Pt will be independence with any updated  HEP    Time 12    Period Weeks    Status New    Target Date 06/08/21      OT LONG TERM GOAL #2   Title Pt will increase functional use of BUE evidenced by completing Box and Blocks with score of 35 or greater with RUE, 45 or greater with LUE.    Baseline R 26, L 39    Time 12    Period Weeks    Status New      OT LONG TERM GOAL #3   Title PT will improve grip strength in BUE by increasing grip strength to 10 lbs or greater with RUE and 20 lbs or greater with LUE.    Baseline R 1.5, L 12.3    Time 12    Period Weeks    Status New      OT LONG TERM GOAL #4   Title Pt will improve isolated finger movements in order to increase skill towards simple typing with adapted strategies and equipment PRN.    Baseline isolated movement in LUE    Time 12    Period Weeks    Status New      OT LONG TERM GOAL #5   Title Pt will improve 9 hole peg test in LUE to completing in 65 seconds or less in order to increase functional use and demonstrate ability to place 2 or more pegs with RUE.    Baseline L 75.13s, R unable    Time 12    Period Weeks    Status New      OT LONG TERM GOAL #6   Title Pt will report completing UB and LB dressing with decreased assistance consistently.    Baseline UB (reports able to do but not consistently doing), LB total A    Time 12    Period Weeks    Status New  Plan - 03/30/21 1721     Clinical Impression Statement Pt demonstrated improvement with coordination today with BUE. Continue progressing towards goals.    OT Occupational Profile and History Detailed Assessment- Review of Records and additional review of physical, cognitive, psychosocial history related to current functional performance    Occupational performance deficits (Please refer to evaluation for details): ADL's;IADL's;Leisure;Work;Rest and Sleep    Body Structure / Function / Physical Skills ADL;IADL;ROM;Strength;Decreased knowledge of use of  DME;Dexterity;GMC;Pain;Tone;UE functional use;Body mechanics;Balance;Continence;FMC;Muscle spasms;Skin integrity;Flexibility;Mobility;Sensation;Improper spinal/pelvic alignment;Endurance    Rehab Potential Good    Clinical Decision Making Several treatment options, min-mod task modification necessary    Comorbidities Affecting Occupational Performance: May have comorbidities impacting occupational performance    Modification or Assistance to Complete Evaluation  Min-Moderate modification of tasks or assist with assess necessary to complete eval    OT Frequency 2x / week    OT Duration 12 weeks    OT Treatment/Interventions Self-care/ADL training;Moist Heat;Fluidtherapy;DME and/or AE instruction;Splinting;Therapeutic activities;Aquatic Therapy;Ultrasound;Therapeutic exercise;Cognitive remediation/compensation;Passive range of motion;Functional Mobility Training;Neuromuscular education;Electrical Stimulation;Paraffin;Manual Therapy;Patient/family education    Plan supine shoulder ROM and exercises, wrist and tricep strengthening, coordination BUE, in hand manipulation / rotation of items in hand    Consulted and Agree with Plan of Care Patient;Family member/caregiver    Family Member Consulted spouse [name]             Patient will benefit from skilled therapeutic intervention in order to improve the following deficits and impairments:   Body Structure / Function / Physical Skills: ADL, IADL, ROM, Strength, Decreased knowledge of use of DME, Dexterity, GMC, Pain, Tone, UE functional use, Body mechanics, Balance, Continence, FMC, Muscle spasms, Skin integrity, Flexibility, Mobility, Sensation, Improper spinal/pelvic alignment, Endurance       Visit Diagnosis: Muscle weakness (generalized)  Quadriplegia, C5-C7 incomplete (HCC)  Other lack of coordination  Other symptoms and signs involving the nervous system  Other abnormalities of gait and mobility  Other disturbances of skin  sensation    Problem List Patient Active Problem List   Diagnosis Date Noted   Quadriplegia, C5-C7 incomplete ([place]) 01/16/2021   History of spinal fracture 01/16/2021   Suprapubic catheter ([place]) 01/16/2021   Encounter for routine gynecological examination 09/28/2013   Onychomycosis 09/28/2013   Foot deformity, acquired 03/26/2012   Encounter for preventive health examination 12/25/2010   ROSACEA 08/25/2009   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

## 2021-03-30 NOTE — Therapy (Signed)
[facility] [address] [place], [place], [address] Phone: [phone]   Fax:  [phone]  Physical Therapy Treatment  Patient Details  Name: [name] MRN: [id number] Date of Birth: July 20, 1950 Referring Provider (PT): Landis Gandy   Encounter Date: 03/30/2021   PT End of Session - 03/30/21 1528     Visit Number 12    Number of Visits 25    Date for PT Re-Evaluation 05/15/21    Authorization Type [employer] 25 visits 9/8-12/2/22    Authorization - Visit Number 12    Authorization - Number of Visits 25    Progress Note Due on Visit 10    PT Start Time 1527    PT Stop Time 1618    PT Time Calculation (min) 51 min    Equipment Utilized During Treatment Other (comment)   slideboard   Activity Tolerance Patient tolerated treatment well    Behavior During Therapy [facility] for tasks assessed/performed             Past Medical History:  Diagnosis Date   CERVICAL POLYP 03/11/2008   Qualifier: Diagnosis of  By: Regis Bill MD, Standley Brooking    Colon polyps 2005   on colonscopy Dr. Fuller Plan   Fibroid 2004   Per Dr. Ouida Sills   History of shingles    face and mouth   Hx of skin cancer, basal cell    Rosacea    Sciatica of left side 09/28/2013   Scoliosis    noted on mri done for back pain    Past Surgical History:  Procedure Laterality Date   BUNIONECTOMY      There were no vitals filed for this visit.   Subjective Assessment - 03/30/21 1528     Subjective Pt arrived early to use SciFit prior to session. Performed x 16 min at 2.0 with BUE and BLE for 5 min x 2 and 4 min x 1 with 1 minute breaks in between. She was set up by rehab tech. Pt reports she is doing well.    Patient is accompained by: Family member   husband, [name]   Pertinent History Pt also takes Toviaz 30m daily. PMH: hyperlipidemia, scoliosis, sleep disorder    Patient Stated Goals Pt would like to be able to walk even if its with  assistance. She also wants to be able to type and improve her ability to do ADLs to allow for more independence.    Currently in Pain? Yes    Pain Score 4     Pain Location Arm    Pain Orientation Right;Left    Pain Descriptors / Indicators Burning;Shooting    Pain Type Neuropathic pain    Pain Onset More than a month ago    Pain Frequency Constant                               [facility] Adult PT Treatment/Exercise - 03/30/21 1530       Bed Mobility   Bed Mobility Rolling Right;Rolling Left;Sit to Supine;Right Sidelying to Sit    Rolling Right Minimal Assistance - Patient > 75%;Contact Guard/Touching assist;Set up assist   PT positioned LLE in flexion min assist then pt performed rocking using arms throwing across body to get momentum to roll.   Rolling Left Set up assist;Supervision/Verbal cueing;Contact Guard/Touching assist   RLE positioned in flexion then pt used arms throwing across body to get  momentum to roll.   Right Sidelying to Sit Moderate Assistance - Patient 50-74%   PT assisted to get legs off mat and then min assist to sit up. Pt did good job keeping weight forward with PT in front for safety when coming up.   Sit to Supine Moderate Assistance - Patient 50-74%   PT assisted to lift legs with most assist on RLE. Pt walked around on hands in long sit and then able to go down to elbows and lay back.     Transfers   Transfers Lateral/Scoot Transfers    Lateral/Scoot Transfers 4: Min guard;4: Min assist;With Warehouse manager;With armrests removed    Lateral/Scoot Transfer Details (indicate cue type and reason) Pt performed transfer powerchair to/from 20" mat with PT assisting to place board but then pt performing transfer CGA from PT with pt instructing pt to reposition right foot at times.      Neuro Re-ed    Neuro Re-ed Details  Sitting edge of mat with feet on floor: pillow case under feet pt performed left knee flexion/extension sliding on floor x 10, repeated on  RLE with with slight movement with PT providing muscle tapping to try to faciliate muscle contraction x 3. PT placed foam roll under right thigh so that foot would be dangling and again tried to get some knee flex/ext on right with slight movement noted. Seated hip abd/adduction sliding on pillow case x 10 on left and x 5 on right with mod assist to abduct. Sidelying left with RLE on powder board with pillow case under foot: right knee flexion/ext x 5 with muscle tapping to try to facilitate contraction, then right hip flexion/ext x 3 with partial through slight range with slow movement. Supine left foot on red physioball with PT stabilizing with pt performing hip flexion and then light resistance in to extension x 10.                       PT Short Term Goals - 03/24/21 2022       PT SHORT TERM GOAL #1   Title Pt will be able to perform initial HEP for stretching and strengthening with caregiver.    Baseline pt reports "we are starting to work on them"    Time 4    Period Weeks    Status On-going    Target Date 04/17/21      PT SHORT TERM GOAL #2   Title Pt will be able to perform slideboard transfer on level surface supervision after no more than min assist to help with board placement.    Baseline Pt required max assist for board placement in wheelchair and on mat -  03-19-21    Time 4    Period Weeks    Status On-going    Target Date 04/17/21      PT SHORT TERM GOAL #3   Title Pt will be able to perform rolling to each side supervision for improved bed mobility.    Baseline Pt able to roll to Lt side with supervision after RLE positioned in flexion; needed CGA to min assist to roll toward Rt side, needs assist to fully roll Lt pelvis from supine to sidelying; assist varies depending on fatigue; needs max assist to position each leg in flexion - 03-19-21    Time 4    Period Weeks    Status On-going    Target Date 04/17/21      PT SHORT TERM GOAL #  4   Title Pt will be  able to perform sit to/from supine transfer mod assist for improved bed mobility.    Baseline needs assist to transfer LE's onto mat for sit to supine; able to hook LUE to pull up to sitting from supine to long sitting    Time 4    Period Weeks    Status Achieved    Target Date 03/20/21      PT SHORT TERM GOAL #5   Title Pt will be able to maintain sitting balance edge of mat with only feet supported x 5 min while performing UE movements for improved sitting balance/trunk stability to assist with sitting ADLs.    Baseline met 03-19-21    Time 4    Period Weeks    Status Achieved    Target Date 03/20/21               PT Long Term Goals - 03/19/21 1925       PT LONG TERM GOAL #1   Title Pt will be able to perform progressive HEP for strengthening, stretching and balance to continue gains on own. (LTGs due 05/15/21)    Time 12    Period Weeks    Status New      PT LONG TERM GOAL #2   Title Pt will be able to perform squat pivot transfer/lateral scoot transfer without slideboard CGA for improved mobility.    Time 12    Period Weeks    Status New      PT LONG TERM GOAL #3   Title Pt will be able to stand at counter x 2 min min assist for improved standing ability.    Time 12    Period Weeks    Status New      PT LONG TERM GOAL #4   Title Pt will be able to perform all bed mobility CGA for improved function.    Time 12    Period Weeks    Status New      PT LONG TERM GOAL #5   Title Pt will report being able to perform 20 minutes of manual w/c propulsion around home for improved UE strength and mobility. (should be getting manual chair soon)    Time 12    Period Weeks    Status New                   Plan - 03/30/21 1635     Clinical Impression Statement Pt was able to warm up on SciFit prior to session with rehab tech setting her up. Will plan to do this on days when appointments not too early or rehab tech not available. PT focused on BLE strengthening/ROM  after and pt was able to demonstrate some improvement on both sides. Able to initiate slight movement on RLE today but fatigues very quickly.    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navy officer Evolving/Moderate complexity    Rehab Potential Good    PT Frequency 2x / week   plus eval   PT Duration 12 weeks    PT Treatment/Interventions ADLs/Self Care Home Management;Electrical Stimulation;DME Instruction;Neuromuscular re-education;Manual techniques;Therapeutic exercise;Balance training;Therapeutic activities;Cryotherapy;Moist Heat;Functional mobility training;Stair training;Gait training;Patient/family education;Orthotic Fit/Training;Wheelchair mobility training;Dry needling;Passive range of motion;Vestibular    PT Next Visit Plan Continue SciFit with training rehab tech  starting before session to get more aerobic activity and strengthening when possible. Will decide at session prior each visit. Not doing next session due to early time.  Slideboard transfer training working towards more level/uphill transfer, bed mobility. Continue to work on functional strengthening for UE, prone position for scapular strengthening. Short sit balance activities to try to engage core some as well as balance with maintaining long sit.  At some point may try bioness to try to get more quad activation with exercises and possibly with standing frame. Pt does have standing frame at home.    Consulted and Agree with Plan of Care Patient;Family member/caregiver    Family Member Consulted husband, [name]             Patient will benefit from skilled therapeutic intervention in order to improve the following deficits and impairments:  Decreased balance,  Decreased mobility, Decreased strength, Impaired sensation, Postural dysfunction, Impaired flexibility, Impaired UE functional use, Impaired tone, Decreased range of motion  Visit Diagnosis: Muscle weakness (generalized)  Quadriplegia, C5-C7 incomplete ([place])     Problem List Patient Active Problem List   Diagnosis Date Noted   Quadriplegia, C5-C7 incomplete ([place]) 01/16/2021   History of spinal fracture 01/16/2021   Suprapubic catheter ([place]) 01/16/2021   Encounter for routine gynecological examination 09/28/2013   Onychomycosis 09/28/2013   Foot deformity, acquired 03/26/2012   Encounter for preventive health examination 12/25/2010   ROSACEA 08/25/2009   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 [place], [place], [address] Phone: [phone]   Fax:  [phone]  Name: [name] MRN: [id number] Date of Birth: [birth year]

## 2021-03-31 ENCOUNTER — Ambulatory Visit (INDEPENDENT_AMBULATORY_CARE_PROVIDER_SITE_OTHER): Payer: Medicare PPO | Admitting: Podiatry

## 2021-03-31 DIAGNOSIS — L89890 Pressure ulcer of other site, unstageable: Secondary | ICD-10-CM

## 2021-04-01 ENCOUNTER — Encounter: Payer: Self-pay | Admitting: Podiatry

## 2021-04-01 ENCOUNTER — Other Ambulatory Visit: Payer: Self-pay | Admitting: Internal Medicine

## 2021-04-01 NOTE — Progress Notes (Signed)
  Subjective:  Patient ID: Markham Jordan, female    DOB: Jun 22, 1950,  MRN: [id number]  Chief Complaint  Patient presents with   Foot Ulcer     3w wound care    70 y.o. female presents with the above complaint. History confirmed with patient.  Continues to improve but the wounds are still present she is still wearing the open ended compression stockings  Objective:  Physical Exam: The feet have significant +2 to +3 pitting edema, cyanosis and pallor with palpation although her dorsalis pedis pulses still weakly palpable.  She has unstageable pressure injuries of the lateral fifth metatarsal and dorsal lateral fifth PIPJ's, the left fifth and right fifth are still significant the right fifth met lateral is nearly fully healed   ABIs reviewed good flow with excellent waveforms Assessment:   1. Pressure injury of toe, unstageable, unspecified laterality ([place])      Plan:  Patient was evaluated and treated and all questions answered.  Continues to do well and improve I think this will still take quite a bit of time to fully heal.  Recommend she continue offloading, using the open-ended compression stockings and using the mupirocin.  I will reevaluate in 6 weeks I debrided the lesions of the overlying dry skin and scab and seem to be improved in appearance.  Return in about 6 weeks (around 05/12/2021) for wound care.

## 2021-04-02 ENCOUNTER — Encounter: Payer: Self-pay | Admitting: Occupational Therapy

## 2021-04-02 ENCOUNTER — Other Ambulatory Visit: Payer: Self-pay

## 2021-04-02 ENCOUNTER — Ambulatory Visit: Payer: Medicare PPO | Admitting: Occupational Therapy

## 2021-04-02 ENCOUNTER — Ambulatory Visit: Payer: Medicare PPO | Admitting: Physical Therapy

## 2021-04-02 DIAGNOSIS — G8253 Quadriplegia, C5-C7 complete: Secondary | ICD-10-CM | POA: Diagnosis not present

## 2021-04-02 DIAGNOSIS — M6281 Muscle weakness (generalized): Secondary | ICD-10-CM

## 2021-04-02 DIAGNOSIS — R293 Abnormal posture: Secondary | ICD-10-CM

## 2021-04-02 DIAGNOSIS — R278 Other lack of coordination: Secondary | ICD-10-CM | POA: Diagnosis not present

## 2021-04-02 DIAGNOSIS — R208 Other disturbances of skin sensation: Secondary | ICD-10-CM

## 2021-04-02 DIAGNOSIS — R2689 Other abnormalities of gait and mobility: Secondary | ICD-10-CM | POA: Diagnosis not present

## 2021-04-02 DIAGNOSIS — R29818 Other symptoms and signs involving the nervous system: Secondary | ICD-10-CM

## 2021-04-02 DIAGNOSIS — G8254 Quadriplegia, C5-C7 incomplete: Secondary | ICD-10-CM

## 2021-04-02 NOTE — Therapy (Signed)
[facility] [address] [place], [place], [address] Phone: [phone]   Fax:  [phone]  Occupational Therapy Treatment  Patient Details  Name: [name] MRN: [id number] Date of Birth: 10/26/1950 Referring Provider (OT): Ina Homes, MD   Encounter Date: 04/02/2021   OT End of Session - 04/02/21 0853     Visit Number 6    Number of Visits 25    Date for OT Re-Evaluation 06/05/21    Authorization Type [employer]    Authorization Time Period Auth Req'd    OT Start Time [phone]    OT Stop Time 0931    OT Time Calculation (min) 38 min    Activity Tolerance Patient tolerated treatment well    Behavior During Therapy [facility] for tasks assessed/performed             Past Medical History:  Diagnosis Date   CERVICAL POLYP 03/11/2008   Qualifier: Diagnosis of  By: Regis Bill MD, Standley Brooking    Colon polyps 2005   on colonscopy Dr. Fuller Plan   Fibroid 2004   Per Dr. Ouida Sills   History of shingles    face and mouth   Hx of skin cancer, basal cell    Rosacea    Sciatica of left side 09/28/2013   Scoliosis    noted on mri done for back pain    Past Surgical History:  Procedure Laterality Date   BUNIONECTOMY      There were no vitals filed for this visit.   Subjective Assessment - 04/02/21 0852     Subjective  pt reports that she is able to do paraffin/HEP as much due to schedule, but they are looking for someone to help with this at home.  Pt acknowleges improvement with bilateral hand grip    Patient is accompanied by: Family member   husband, [name]   Pertinent History hyperlipidemia, scoliosis, sleep disorder    Patient Stated Goals regain arm strength for rolling over in bed and fine motor coordination for increasing typing    Currently in Pain? Yes    Pain Score 4     Pain Location Arm    Pain Orientation Right;Left    Pain Descriptors / Indicators Burning;Shooting    Pain Type  Neuropathic pain    Pain Onset More than a month ago    Aggravating Factors  nothing    Pain Relieving Factors nothing               Reclined in w/c: Cane ex for shoulder flex, abduction, ER, and chest press with BUEs x10-15 reps each followed by cane ex with 1lb wt on each wrist for shoulder flex, abduction, and chest press with BUEs x10 reps each.  PROM to each hand in finger flex with wrist ext followed by place and holds and finger ext with wrist flex (for tenodesis), then thumb palmar and radial abduction bilaterally and thumb flexion with L hand.  Flipping thick checkers with LUE with min-mod difficulty, primarily using 2nd digit/thumb for manipulation and used tabletop support.          OT Short Term Goals - 03/17/21 1124       OT SHORT TERM GOAL #1   Title Pt will be independent with HEP w CG assistance PRN    Time 4    Period Weeks    Status On-going    Target Date 04/13/21      OT SHORT TERM GOAL #2  Title Pt will verbalize understanding and report independence with CG assistance with ues of modalities at home with good safety.    Baseline has paraffin and Estim unit    Time 4    Period Weeks    Status New      OT SHORT TERM GOAL #3   Title Pt will increase Box and Blocks score with RUE to 30 blocks or greater    Baseline R 26 L 39    Time 4    Period Weeks    Status New      OT SHORT TERM GOAL #4   Title Pt will increase BUE tricep strength to 4/5 consistently for increasing ability to perform SB transfers with supervision/set up assistance only.    Baseline 3+/5 strength BUE    Time 4    Period Weeks    Status New      OT SHORT TERM GOAL #5   Title Pt will increase coordination in LUE to completing 9 hole peg test in 70 seconds or less.    Baseline L 75.13s, R unable    Time 4    Period Weeks    Status New      OT SHORT TERM GOAL #6   Title Pt will verbalize understanding of adapted strategies and/or equipment for increasing indepenence with  ADLs and IADLs (typing, bathing, cutting food, etc)    Baseline has U cuff    Time 4    Period Weeks    Status New               OT Long Term Goals - 03/16/21 0943       OT LONG TERM GOAL #1   Title Pt will be independence with any updated HEP    Time 12    Period Weeks    Status New    Target Date 06/08/21      OT LONG TERM GOAL #2   Title Pt will increase functional use of BUE evidenced by completing Box and Blocks with score of 35 or greater with RUE, 45 or greater with LUE.    Baseline R 26, L 39    Time 12    Period Weeks    Status New      OT LONG TERM GOAL #3   Title PT will improve grip strength in BUE by increasing grip strength to 10 lbs or greater with RUE and 20 lbs or greater with LUE.    Baseline R 1.5, L 12.3    Time 12    Period Weeks    Status New      OT LONG TERM GOAL #4   Title Pt will improve isolated finger movements in order to increase skill towards simple typing with adapted strategies and equipment PRN.    Baseline isolated movement in LUE    Time 12    Period Weeks    Status New      OT LONG TERM GOAL #5   Title Pt will improve 9 hole peg test in LUE to completing in 65 seconds or less in order to increase functional use and demonstrate ability to place 2 or more pegs with RUE.    Baseline L 75.13s, R unable    Time 12    Period Weeks    Status New      OT LONG TERM GOAL #6   Title Pt will report completing UB and LB dressing with decreased assistance consistently.  Baseline UB (reports able to do but not consistently doing), LB total A    Time 12    Period Weeks    Status New                   Plan - 04/02/21 1544     Clinical Impression Statement Pt demo improved grasp of bilateral hands, improved L hand manipulation with 2nd digit/thumb (with support by table) and tolerating strengthening well.    OT Occupational Profile and History Detailed Assessment- Review of Records and additional review of physical,  cognitive, psychosocial history related to current functional performance    Occupational performance deficits (Please refer to evaluation for details): ADL's;IADL's;Leisure;Work;Rest and Sleep    Body Structure / Function / Physical Skills ADL;IADL;ROM;Strength;Decreased knowledge of use of DME;Dexterity;GMC;Pain;Tone;UE functional use;Body mechanics;Balance;Continence;FMC;Muscle spasms;Skin integrity;Flexibility;Mobility;Sensation;Improper spinal/pelvic alignment;Endurance    Rehab Potential Good    Clinical Decision Making Several treatment options, min-mod task modification necessary    Comorbidities Affecting Occupational Performance: May have comorbidities impacting occupational performance    Modification or Assistance to Complete Evaluation  Min-Moderate modification of tasks or assist with assess necessary to complete eval    OT Frequency 2x / week    OT Duration 12 weeks    OT Treatment/Interventions Self-care/ADL training;Moist Heat;Fluidtherapy;DME and/or AE instruction;Splinting;Therapeutic activities;Aquatic Therapy;Ultrasound;Therapeutic exercise;Cognitive remediation/compensation;Passive range of motion;Functional Mobility Training;Neuromuscular education;Electrical Stimulation;Paraffin;Manual Therapy;Patient/family education    Plan AE/strategies for ADLs; continue with UE strengthening and ROM, coordination BUE, in hand manipulation / rotation of items in hand    Consulted and Agree with Plan of Care Patient;Family member/caregiver    Family Member Consulted spouse [name]             Patient will benefit from skilled therapeutic intervention in order to improve the following deficits and impairments:   Body Structure / Function / Physical Skills: ADL, IADL, ROM, Strength, Decreased knowledge of use of DME, Dexterity, GMC, Pain, Tone, UE functional use, Body mechanics, Balance, Continence, FMC, Muscle spasms, Skin integrity, Flexibility, Mobility, Sensation, Improper  spinal/pelvic alignment, Endurance       Visit Diagnosis: Muscle weakness (generalized)  Other lack of coordination  Other symptoms and signs involving the nervous system  Other disturbances of skin sensation  Abnormal posture    Problem List Patient Active Problem List   Diagnosis Date Noted   Quadriplegia, C5-C7 incomplete ([place]) 01/16/2021   History of spinal fracture 01/16/2021   Suprapubic catheter ([place]) 01/16/2021   Encounter for routine gynecological examination 09/28/2013   Onychomycosis 09/28/2013   Foot deformity, acquired 03/26/2012   Encounter for preventive health examination 12/25/2010   ROSACEA 08/25/2009   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 [place], [place]  [address] [phone] phone [phone] 04/02/21 3:51 PM

## 2021-04-02 NOTE — Therapy (Signed)
[facility] [address] [place], [place], [address] Phone: [phone]   Fax:  [phone]  Physical Therapy Treatment  Patient Details  Name: [name] MRN: [id number] Date of Birth: 01/15/1951 Referring Provider (PT): Landis Gandy   Encounter Date: 04/02/2021   PT End of Session - 04/02/21 0922     Visit Number 13    Number of Visits 25    Date for PT Re-Evaluation 05/15/21    Authorization Type [employer] 25 visits 9/8-12/2/22    Authorization - Visit Number 13    Authorization - Number of Visits 25    Progress Note Due on Visit 20    PT Start Time 0800    PT Stop Time 0845    PT Time Calculation (min) 45 min    Equipment Utilized During Treatment Other (comment)   slideboard   Activity Tolerance Patient tolerated treatment well    Behavior During Therapy [facility] for tasks assessed/performed             Past Medical History:  Diagnosis Date   CERVICAL POLYP 03/11/2008   Qualifier: Diagnosis of  By: Regis Bill MD, Standley Brooking    Colon polyps 2005   on colonscopy Dr. Fuller Plan   Fibroid 2004   Per Dr. Ouida Sills   History of shingles    face and mouth   Hx of skin cancer, basal cell    Rosacea    Sciatica of left side 09/28/2013   Scoliosis    noted on mri done for back pain    Past Surgical History:  Procedure Laterality Date   BUNIONECTOMY      There were no vitals filed for this visit.   Subjective Assessment - 04/02/21 0909     Subjective No soreness in LE after last session.  Would like to perform SCI Fit after OT today.  Is standing in standing frame a couple times a week, husband asking what is the recommended frequency and duration for standing.  Is looking at purchasing a 20" tall exercise mat table for home.    Patient is accompained by: Family member   husband, [name]   Pertinent History Pt also takes Toviaz 73m daily. PMH: hyperlipidemia, scoliosis, sleep disorder    Patient  Stated Goals Pt would like to be able to walk even if its with assistance. She also wants to be able to type and improve her ability to do ADLs to allow for more independence.    Currently in Pain? No/denies    Pain Onset More than a month ago               [facility] 0913       Transfers   Transfers Lateral/Scoot Transfers;Supine to Sit;Sit to Supine    Lateral/Scoot Transfers 4: Min guard;5: Supervision    Lateral/Scoot Transfer Details (indicate cue type and reason) Therapist continues to assist with placement of slideboard with pt performing lateral leans and LE lift and verbally guiding appropriate placement of board.  Sliding on slightly unlevel surface 1.5" difference, required min A to change LE position and provided verbal cues for head hips relationship due to increased difficulty pushing with RUE and RLE.  When returning to w/c from mat pt performed with supervision once LE placed on foot plates; pt able to push through LUE and LLE and reposition L foot without difficulty.  Pt also able to lean to L side in short sitting to  allow placement of board and push back upright with supervision.    Supine to Sit 2: Max assist   for LE management and hooking to bring trunk upright   Sit to Supine 3: Mod assist    Sit to Supine Details  for LE management      Balance   Balance Assessed Yes      Dynamic Sitting Balance   Dynamic Sitting - Balance Support Right upper extremity supported;Left upper extremity supported;Feet supported;No upper extremity supported;During functional activity    Dynamic Sitting - Level of Assistance 5: Stand by assistance;3: Mod assist    Dynamic Sitting - Balance Activities Other (comment)    Sitting balance - Comments Short sitting on mat with feet supported performed UE strengthening exercises with green resistance band.  When performing rows pt required supervision and no back support; when performing horizontal ABD/scap  retraction with bilat UE required mod A for back support due to posterior LOB.  When performing tricep extensions and shoulder ER pt required one UE support for balance.      Exercises   Exercises Other Exercises    Other Exercises  Resisted UE strengthening: 2 sets x 15 reps each: bilat UE rows, single UE shoulder extension for tricep strengthening, bilat UE horizontal ABD and scap retraction with back support and therapist providing slight over pressure for pec stretch, single UE shoulder ER with elbow flexed to 90 and squeezing pillow between arm and trunk.  Transitioned to supine and performed single leg press with therapist providing manual resistance; able to perform 10 reps with LLE with declining strength around repetition 8 due to fatigue; RLE pt required increased attempts to activate hip and knee extensors against minimal resistance from therapist - performed 6 reps RLE                PT Education - 04/02/21 0921     Education Details recommendation to perform standing frame 30 minutes a day if possible, provided pt with green theraband to upgrade exercises for home    Person(s) Educated Patient;Spouse    Methods Explanation    Comprehension Verbalized understanding              PT Short Term Goals - 03/24/21 2022       PT SHORT TERM GOAL #1   Title Pt will be able to perform initial HEP for stretching and strengthening with caregiver.    Baseline pt reports "we are starting to work on them"    Time 4    Period Weeks    Status On-going    Target Date 04/17/21      PT SHORT TERM GOAL #2   Title Pt will be able to perform slideboard transfer on level surface supervision after no more than min assist to help with board placement.    Baseline Pt required max assist for board placement in wheelchair and on mat -  03-19-21    Time 4    Period Weeks    Status On-going    Target Date 04/17/21      PT SHORT TERM GOAL #3   Title Pt will be able to perform rolling to  each side supervision for improved bed mobility.    Baseline Pt able to roll to Lt side with supervision after RLE positioned in flexion; needed CGA to min assist to roll toward Rt side, needs assist to fully roll Lt pelvis from supine to sidelying; assist varies depending on fatigue; needs max assist  to position each leg in flexion - 03-19-21    Time 4    Period Weeks    Status On-going    Target Date 04/17/21      PT SHORT TERM GOAL #4   Title Pt will be able to perform sit to/from supine transfer mod assist for improved bed mobility.    Baseline needs assist to transfer LE's onto mat for sit to supine; able to hook LUE to pull up to sitting from supine to long sitting    Time 4    Period Weeks    Status Achieved    Target Date 03/20/21      PT SHORT TERM GOAL #5   Title Pt will be able to maintain sitting balance edge of mat with only feet supported x 5 min while performing UE movements for improved sitting balance/trunk stability to assist with sitting ADLs.    Baseline met 03-19-21    Time 4    Period Weeks    Status Achieved    Target Date 03/20/21               PT Long Term Goals - 03/19/21 1925       PT LONG TERM GOAL #1   Title Pt will be able to perform progressive HEP for strengthening, stretching and balance to continue gains on own. (LTGs due 05/15/21)    Time 12    Period Weeks    Status New      PT LONG TERM GOAL #2   Title Pt will be able to perform squat pivot transfer/lateral scoot transfer without slideboard CGA for improved mobility.    Time 12    Period Weeks    Status New      PT LONG TERM GOAL #3   Title Pt will be able to stand at counter x 2 min min assist for improved standing ability.    Time 12    Period Weeks    Status New      PT LONG TERM GOAL #4   Title Pt will be able to perform all bed mobility CGA for improved function.    Time 12    Period Weeks    Status New      PT LONG TERM GOAL #5   Title Pt will report being able to  perform 20 minutes of manual w/c propulsion around home for improved UE strength and mobility. (should be getting manual chair soon)    Time 12    Period Weeks    Status New                   Plan - 04/02/21 2841     Clinical Impression Statement Continued to focus on UE strengthening and postural exercises while addressing dynamic sitting balance; upgraded exercises by increasing resistance to green theraband.  Also continued to focus on closed chain LE strengthening; still requires increased time and repetitions to activate RLE.  Pt is demonstrating significant improvement in L strength and ability to use LUE for transitional movements and transfers.    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[employer] officer Evolving/Moderate complexity    Rehab Potential Good    PT Frequency 2x / week   plus eval   PT Duration 12 weeks    PT Treatment/Interventions ADLs/Self Care Home  Management;Electrical Stimulation;DME Instruction;Neuromuscular re-education;Manual techniques;Therapeutic exercise;Balance training;Therapeutic activities;Cryotherapy;Moist Heat;Functional mobility training;Stair training;Gait training;Patient/family education;Orthotic Fit/Training;Wheelchair mobility training;Dry needling;Passive range of motion;Vestibular    PT Next Visit Plan Schedule more visits through Dec?  Continue SciFit with training rehab tech starting before session to get more aerobic activity and strengthening when possible. Will decide at session prior each visit. Slideboard transfer training working towards more level/uphill transfer, bed mobility. Continue to work on functional strengthening for UE, prone position for scapular strengthening.  Short sit balance activities to try to engage core some as well as balance with maintaining long sit.  At some point may try bioness to try to get more quad activation with exercises and possibly with standing frame. Pt does have standing frame at home - have encouraged her to use more daily    Consulted and Agree with Plan of Care Patient;Family member/caregiver    Family Member Consulted husband, [name]             Patient will benefit from skilled therapeutic intervention in order to improve the following deficits and impairments:  Decreased balance, Decreased mobility, Decreased strength, Impaired sensation, Postural dysfunction, Impaired flexibility, Impaired UE functional use, Impaired tone, Decreased range of motion  Visit Diagnosis: Muscle weakness (generalized)  Abnormal posture  Quadriplegia, C5-C7 incomplete ([facility])  Other symptoms and signs involving the nervous system  Other abnormalities of gait and mobility  Other disturbances of skin sensation     Problem List Patient Active Problem List   Diagnosis Date Noted   Quadriplegia, C5-C7 incomplete ([place]) 01/16/2021   History of spinal fracture 01/16/2021   Suprapubic catheter ([place]) 01/16/2021   Encounter for routine gynecological examination 09/28/2013   Onychomycosis 09/28/2013   Foot deformity, acquired 03/26/2012   Encounter for preventive health examination 12/25/2010   ROSACEA 08/25/2009   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

## 2021-04-03 NOTE — Telephone Encounter (Signed)
Ok to refill as reported for  with 2 refills

## 2021-04-07 ENCOUNTER — Ambulatory Visit: Payer: Medicare PPO | Admitting: Occupational Therapy

## 2021-04-07 ENCOUNTER — Ambulatory Visit: Payer: Medicare PPO

## 2021-04-07 ENCOUNTER — Other Ambulatory Visit: Payer: Self-pay

## 2021-04-07 ENCOUNTER — Encounter: Payer: Self-pay | Admitting: Occupational Therapy

## 2021-04-07 DIAGNOSIS — G8253 Quadriplegia, C5-C7 complete: Secondary | ICD-10-CM | POA: Diagnosis not present

## 2021-04-07 DIAGNOSIS — R278 Other lack of coordination: Secondary | ICD-10-CM | POA: Diagnosis not present

## 2021-04-07 DIAGNOSIS — R293 Abnormal posture: Secondary | ICD-10-CM | POA: Diagnosis not present

## 2021-04-07 DIAGNOSIS — M6281 Muscle weakness (generalized): Secondary | ICD-10-CM | POA: Diagnosis not present

## 2021-04-07 DIAGNOSIS — G8254 Quadriplegia, C5-C7 incomplete: Secondary | ICD-10-CM | POA: Diagnosis not present

## 2021-04-07 DIAGNOSIS — R208 Other disturbances of skin sensation: Secondary | ICD-10-CM

## 2021-04-07 DIAGNOSIS — R29818 Other symptoms and signs involving the nervous system: Secondary | ICD-10-CM

## 2021-04-07 DIAGNOSIS — R2689 Other abnormalities of gait and mobility: Secondary | ICD-10-CM | POA: Diagnosis not present

## 2021-04-07 NOTE — Therapy (Signed)
[facility] [address] [place], [place], [address] Phone: [phone]   Fax:  [phone]  Physical Therapy Treatment  Patient Details  Name: [name] MRN: [id number] Date of Birth: 1950-10-31 Referring Provider (PT): Landis Gandy   Encounter Date: 04/07/2021   PT End of Session - 04/07/21 1147     Visit Number 14    Number of Visits 25    Date for PT Re-Evaluation 05/15/21    Authorization Type [employer] 25 visits 9/8-12/2/22    Authorization - Visit Number 14    Authorization - Number of Visits 25    Progress Note Due on Visit 20    PT Start Time 0092    PT Stop Time 1232    PT Time Calculation (min) 47 min    Equipment Utilized During Treatment Other (comment)   slideboard   Activity Tolerance Patient tolerated treatment well    Behavior During Therapy [facility] for tasks assessed/performed             Past Medical History:  Diagnosis Date   CERVICAL POLYP 03/11/2008   Qualifier: Diagnosis of  By: Regis Bill MD, Standley Brooking    Colon polyps 2005   on colonscopy Dr. Fuller Plan   Fibroid 2004   Per Dr. Ouida Sills   History of shingles    face and mouth   Hx of skin cancer, basal cell    Rosacea    Sciatica of left side 09/28/2013   Scoliosis    noted on mri done for back pain    Past Surgical History:  Procedure Laterality Date   BUNIONECTOMY      There were no vitals filed for this visit.   Subjective Assessment - 04/07/21 1147     Subjective Pt reports that she is trying to get SciFit for home. Really enjoys it.    Patient is accompained by: Family member   husband, [name]   Pertinent History Pt also takes Toviaz 55m daily. PMH: hyperlipidemia, scoliosis, sleep disorder    Patient Stated Goals Pt would like to be able to walk even if its with assistance. She also wants to be able to type and improve her ability to do ADLs to allow for more independence.    Currently in Pain? Yes     Pain Score 4     Pain Location Arm    Pain Orientation Right;Left    Pain Descriptors / Indicators Burning    Pain Onset More than a month ago    Pain Frequency Constant                               [facility] Adult PT Treatment/Exercise - 04/07/21 1148       Bed Mobility   Bed Mobility Rolling Right;Right Sidelying to Sit;Sit to Supine    Rolling Right Contact Guard/Touching assist;Minimal Assistance - Patient > 75%   PT assisted to position LLE in flexion on mat. Pt then utilized 3 rocks with throwing arms across body to perform roll to right. Peformed 3 different times with only CGA/supervision once leg up.   Right Sidelying to Sit Moderate Assistance - Patient 50-74%;Maximal Assistance - Patient 25-49%   To get legs off mat max assist then mod assist to get right elbow under her with pt completing the movement after that.   Supine to Sit Moderate Assistance - Patient 50-74%   supine to long sit  with pulling on PT arm to come up   Sit to Supine Moderate Assistance - Patient 50-74%   To lift legs on mat. Pt maintained long sit propping on hands during process. Then able to go back to elbows to let herself down.     Transfers   Transfers Lateral/Scoot Transfers    Lateral/Scoot Transfers 4: Min guard;4: Medical illustrator Details (indicate cue type and reason) Pt continued to assist with board placement. Pt able to perform powerchair to mat level transfer CGA with asking PT to reposition right foot at times. With return to chair was CGA/min assist at end to get hips fully back in chair.      Neuro Re-ed    Neuro Re-ed Details  Sitting edge of mat: coming down on forearm to side and reaching across with other arm for target and then back up x 5 each side with pushing up with arm and cues to keep weight forward. In long sit: coming down on forearm to the side and back up x 5 each side. Pt needed min assist on left and was cued to try to keep weight forward  more. With going down to right pt able to perform on own with anchoring with left hand on leg as well. Trying to maintain balance in long sit with 1 UE support to prop and reaching with other hand x 10 each side with min assist behind. Walking back on hands to prop with shoulder extension then walking back up to long sit with focus on shoulder depression x 5.      Exercises   Exercises Other Exercises    Other Exercises  Seated edge of mat: knee flexion/ext with foot on towel x 10 on left then active assisted on right with minimal movement but palpable contraction x 10 with muscle tapping to facilitate and PT also lifting up thigh some to deweight. In long sit passive hamstring stretch 30 sec x 2.                       PT Short Term Goals - 03/24/21 2022       PT SHORT TERM GOAL #1   Title Pt will be able to perform initial HEP for stretching and strengthening with caregiver.    Baseline pt reports "we are starting to work on them"    Time 4    Period Weeks    Status On-going    Target Date 04/17/21      PT SHORT TERM GOAL #2   Title Pt will be able to perform slideboard transfer on level surface supervision after no more than min assist to help with board placement.    Baseline Pt required max assist for board placement in wheelchair and on mat -  03-19-21    Time 4    Period Weeks    Status On-going    Target Date 04/17/21      PT SHORT TERM GOAL #3   Title Pt will be able to perform rolling to each side supervision for improved bed mobility.    Baseline Pt able to roll to Lt side with supervision after RLE positioned in flexion; needed CGA to min assist to roll toward Rt side, needs assist to fully roll Lt pelvis from supine to sidelying; assist varies depending on fatigue; needs max assist to position each leg in flexion - 03-19-21    Time 4    Period Weeks  Status On-going    Target Date 04/17/21      PT SHORT TERM GOAL #4   Title Pt will be able to perform sit  to/from supine transfer mod assist for improved bed mobility.    Baseline needs assist to transfer LE's onto mat for sit to supine; able to hook LUE to pull up to sitting from supine to long sitting    Time 4    Period Weeks    Status Achieved    Target Date 03/20/21      PT SHORT TERM GOAL #5   Title Pt will be able to maintain sitting balance edge of mat with only feet supported x 5 min while performing UE movements for improved sitting balance/trunk stability to assist with sitting ADLs.    Baseline met 03-19-21    Time 4    Period Weeks    Status Achieved    Target Date 03/20/21               PT Long Term Goals - 03/19/21 1925       PT LONG TERM GOAL #1   Title Pt will be able to perform progressive HEP for strengthening, stretching and balance to continue gains on own. (LTGs due 05/15/21)    Time 12    Period Weeks    Status New      PT LONG TERM GOAL #2   Title Pt will be able to perform squat pivot transfer/lateral scoot transfer without slideboard CGA for improved mobility.    Time 12    Period Weeks    Status New      PT LONG TERM GOAL #3   Title Pt will be able to stand at counter x 2 min min assist for improved standing ability.    Time 12    Period Weeks    Status New      PT LONG TERM GOAL #4   Title Pt will be able to perform all bed mobility CGA for improved function.    Time 12    Period Weeks    Status New      PT LONG TERM GOAL #5   Title Pt will report being able to perform 20 minutes of manual w/c propulsion around home for improved UE strength and mobility. (should be getting manual chair soon)    Time 12    Period Weeks    Status New                   Plan - 04/07/21 1546     Clinical Impression Statement Pt continued to work on functional mobility on mat. Was able to roll to right on own after assist to flex left knee up using arms to help rock with momentum. She is more challenged in long sit with maintaining balance due to  tight hamstrings than in short sit.    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[employer] officer Evolving/Moderate complexity    Rehab Potential Good    PT Frequency 2x / week   plus eval   PT Duration 12 weeks    PT Treatment/Interventions ADLs/Self Care Home Management;Electrical Stimulation;DME Instruction;Neuromuscular re-education;Manual techniques;Therapeutic exercise;Balance training;Therapeutic activities;Cryotherapy;Moist Heat;Functional mobility training;Stair training;Gait training;Patient/family education;Orthotic Fit/Training;Wheelchair mobility training;Dry needling;Passive range of motion;Vestibular    PT Next Visit Plan Continue SciFit with  training rehab tech starting before session to get more aerobic activity and strengthening when possible. Will decide at session prior each visit. Slideboard transfer training working towards more level/uphill transfer, bed mobility. Continue to work on functional strengthening for UE, prone position for scapular strengthening. Short sit balance activities to try to engage core some as well as balance with maintaining long sit.  At some point may try bioness to try to get more quad activation with exercises and possibly with standing frame. Pt does have standing frame at home - have encouraged her to use more daily    Consulted and Agree with Plan of Care Patient;Family member/caregiver    Family Member Consulted husband, [name]             Patient will benefit from skilled therapeutic intervention in order to improve the following deficits and impairments:  Decreased balance, Decreased mobility, Decreased strength, Impaired sensation, Postural dysfunction, Impaired flexibility, Impaired  UE functional use, Impaired tone, Decreased range of motion  Visit Diagnosis: Muscle weakness (generalized)  Quadriplegia, C5-C7 complete [facility])     Problem List Patient Active Problem List   Diagnosis Date Noted   Quadriplegia, C5-C7 incomplete ([place]) 01/16/2021   History of spinal fracture 01/16/2021   Suprapubic catheter ([place]) 01/16/2021   Encounter for routine gynecological examination 09/28/2013   Onychomycosis 09/28/2013   Foot deformity, acquired 03/26/2012   Encounter for preventive health examination 12/25/2010   ROSACEA 08/25/2009   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 [place], [place], [address] Phone: [phone]   Fax:  [phone]  Name: Darrelyn Morro MRN: [id number] Date of Birth: [birth year]

## 2021-04-07 NOTE — Therapy (Signed)
[facility] [address] [place], [place], [address] Phone: [phone]   Fax:  [phone]  Occupational Therapy Treatment  Patient Details  Name: [name] MRN: [id number] Date of Birth: 25-Feb-1951 Referring Provider (OT): Ina Homes, MD   Encounter Date: 04/07/2021   OT End of Session - 04/07/21 1230     Visit Number 7    Number of Visits 25    Date for OT Re-Evaluation 06/05/21    Authorization Type [employer]    Authorization Time Period Auth Req'd - 16 visits for OT 03/13/21 - 06/08/21    Authorization - Visit Number 7    Authorization - Number of Visits 16    OT Start Time 6962    OT Stop Time 1315    OT Time Calculation (min) 44 min    Activity Tolerance Patient tolerated treatment well    Behavior During Therapy [facility] for tasks assessed/performed             Past Medical History:  Diagnosis Date   CERVICAL POLYP 03/11/2008   Qualifier: Diagnosis of  By: Regis Bill MD, Standley Brooking    Colon polyps 2005   on colonscopy Dr. Fuller Plan   Fibroid 2004   Per Dr. Ouida Sills   History of shingles    face and mouth   Hx of skin cancer, basal cell    Rosacea    Sciatica of left side 09/28/2013   Scoliosis    noted on mri done for back pain    Past Surgical History:  Procedure Laterality Date   BUNIONECTOMY      There were no vitals filed for this visit.   Subjective Assessment - 04/07/21 1234     Subjective  "think this finger is getting worse - we are starting a new caregiver"    Patient is accompanied by: Family member   husband, [name]   Pertinent History hyperlipidemia, scoliosis, sleep disorder    Patient Stated Goals regain arm strength for rolling over in bed and fine motor coordination for increasing typing    Currently in Pain? Yes    Pain Score 4     Pain Location Arm    Pain Orientation Right;Left    Pain Descriptors / Indicators Burning    Pain Type Neuropathic pain     Pain Onset More than a month ago    Pain Frequency Constant                          OT Treatments/Exercises (OP) - 04/07/21 1239       ADLs   Eating reports difficulty with RUE and holding knife for cutting. Has tried rocker knife but reports it concerns her to use.            Stacking pennies - with LUE, working on flicking pennies with thumb in hand with LUE x 5 with mod max difficulty Picking up and putting pennies into piggy bank with RUE.   Resistance [employer] pt unable to complete with RUE d/t decreased strength. Pt completed 1-6# with LUE with mod/max difficulty with blue (6#) - Pt able to complete black ones with LUE with placing on smallest rod but fatigued quickly.    Seated Cane/Dowel Exercises - circumduction, chest press, horizontal abduction        OT Short Term Goals - 03/17/21 1124       OT SHORT TERM GOAL #1   Title Pt  will be independent with HEP w CG assistance PRN    Time 4    Period Weeks    Status On-going    Target Date 04/13/21      OT SHORT TERM GOAL #2   Title Pt will verbalize understanding and report independence with CG assistance with ues of modalities at home with good safety.    Baseline has paraffin and Estim unit    Time 4    Period Weeks    Status New      OT SHORT TERM GOAL #3   Title Pt will increase Box and Blocks score with RUE to 30 blocks or greater    Baseline R 26 L 39    Time 4    Period Weeks    Status New      OT SHORT TERM GOAL #4   Title Pt will increase BUE tricep strength to 4/5 consistently for increasing ability to perform SB transfers with supervision/set up assistance only.    Baseline 3+/5 strength BUE    Time 4    Period Weeks    Status New      OT SHORT TERM GOAL #5   Title Pt will increase coordination in LUE to completing 9 hole peg test in 70 seconds or less.    Baseline L 75.13s, R unable    Time 4    Period Weeks    Status New      OT SHORT TERM GOAL #6   Title Pt  will verbalize understanding of adapted strategies and/or equipment for increasing indepenence with ADLs and IADLs (typing, bathing, cutting food, etc)    Baseline has U cuff    Time 4    Period Weeks    Status New               OT Long Term Goals - 03/16/21 0943       OT LONG TERM GOAL #1   Title Pt will be independence with any updated HEP    Time 12    Period Weeks    Status New    Target Date 06/08/21      OT LONG TERM GOAL #2   Title Pt will increase functional use of BUE evidenced by completing Box and Blocks with score of 35 or greater with RUE, 45 or greater with LUE.    Baseline R 26, L 39    Time 12    Period Weeks    Status New      OT LONG TERM GOAL #3   Title PT will improve grip strength in BUE by increasing grip strength to 10 lbs or greater with RUE and 20 lbs or greater with LUE.    Baseline R 1.5, L 12.3    Time 12    Period Weeks    Status New      OT LONG TERM GOAL #4   Title Pt will improve isolated finger movements in order to increase skill towards simple typing with adapted strategies and equipment PRN.    Baseline isolated movement in LUE    Time 12    Period Weeks    Status New      OT LONG TERM GOAL #5   Title Pt will improve 9 hole peg test in LUE to completing in 65 seconds or less in order to increase functional use and demonstrate ability to place 2 or more pegs with RUE.    Baseline L 75.13s, R unable  Time 12    Period Weeks    Status New      OT LONG TERM GOAL #6   Title Pt will report completing UB and LB dressing with decreased assistance consistently.    Baseline UB (reports able to do but not consistently doing), LB total A    Time 12    Period Weeks    Status New                   Plan - 04/07/21 1429     Clinical Impression Statement Pt with improved strength and cooridnation in BUE today - continue to progress towards goals.    OT Occupational Profile and History Detailed Assessment- Review of Records  and additional review of physical, cognitive, psychosocial history related to current functional performance    Occupational performance deficits (Please refer to evaluation for details): ADL's;IADL's;Leisure;Work;Rest and Sleep    Body Structure / Function / Physical Skills ADL;IADL;ROM;Strength;Decreased knowledge of use of DME;Dexterity;GMC;Pain;Tone;UE functional use;Body mechanics;Balance;Continence;FMC;Muscle spasms;Skin integrity;Flexibility;Mobility;Sensation;Improper spinal/pelvic alignment;Endurance    Rehab Potential Good    Clinical Decision Making Several treatment options, min-mod task modification necessary    Comorbidities Affecting Occupational Performance: May have comorbidities impacting occupational performance    Modification or Assistance to Complete Evaluation  Min-Moderate modification of tasks or assist with assess necessary to complete eval    OT Frequency 2x / week    OT Duration 12 weeks    OT Treatment/Interventions Self-care/ADL training;Moist Heat;Fluidtherapy;DME and/or AE instruction;Splinting;Therapeutic activities;Aquatic Therapy;Ultrasound;Therapeutic exercise;Cognitive remediation/compensation;Passive range of motion;Functional Mobility Training;Neuromuscular education;Electrical Stimulation;Paraffin;Manual Therapy;Patient/family education    Plan AE/strategies for ADLs; continue with UE strengthening and ROM, coordination BUE, in hand manipulation / rotation of items in hand    Consulted and Agree with Plan of Care Patient;Family member/caregiver    Family Member Consulted spouse [name]             Patient will benefit from skilled therapeutic intervention in order to improve the following deficits and impairments:   Body Structure / Function / Physical Skills: ADL, IADL, ROM, Strength, Decreased knowledge of use of DME, Dexterity, GMC, Pain, Tone, UE functional use, Body mechanics, Balance, Continence, FMC, Muscle spasms, Skin integrity, Flexibility,  Mobility, Sensation, Improper spinal/pelvic alignment, Endurance       Visit Diagnosis: Muscle weakness (generalized)  Quadriplegia, C5-C7 incomplete ([facility])  Other lack of coordination  Other disturbances of skin sensation  Other symptoms and signs involving the nervous system    Problem List Patient Active Problem List   Diagnosis Date Noted   Quadriplegia, C5-C7 incomplete ([place]) 01/16/2021   History of spinal fracture 01/16/2021   Suprapubic catheter ([place]) 01/16/2021   Encounter for routine gynecological examination 09/28/2013   Onychomycosis 09/28/2013   Foot deformity, acquired 03/26/2012   Encounter for preventive health examination 12/25/2010   ROSACEA 08/25/2009   Disturbance in sleep behavior 03/11/2008   SKIN CANCER, HX OF 03/11/2008   DYSURIA, HX OF 03/11/2008   Hyperlipidemia 02/10/2007   CERVICALGIA 02/10/2007    Zachery Conch, OT/L 04/07/2021, 2:29 PM  [facility] [address] [place], [place], [address] Phone: [phone]   Fax:  [phone]  Name: Kady Toothaker MRN: [id number] Date of Birth: Oct 10, 1950

## 2021-04-09 ENCOUNTER — Ambulatory Visit: Payer: Medicare PPO

## 2021-04-09 ENCOUNTER — Ambulatory Visit: Payer: Medicare PPO | Admitting: Occupational Therapy

## 2021-04-09 DIAGNOSIS — G8254 Quadriplegia, C5-C7 incomplete: Secondary | ICD-10-CM | POA: Diagnosis not present

## 2021-04-09 DIAGNOSIS — N319 Neuromuscular dysfunction of bladder, unspecified: Secondary | ICD-10-CM | POA: Diagnosis not present

## 2021-04-09 DIAGNOSIS — R338 Other retention of urine: Secondary | ICD-10-CM | POA: Diagnosis not present

## 2021-04-10 ENCOUNTER — Encounter: Payer: Self-pay | Admitting: Occupational Therapy

## 2021-04-10 ENCOUNTER — Ambulatory Visit: Payer: Medicare PPO | Admitting: Occupational Therapy

## 2021-04-10 ENCOUNTER — Ambulatory Visit: Payer: Medicare PPO

## 2021-04-10 ENCOUNTER — Other Ambulatory Visit: Payer: Self-pay

## 2021-04-10 DIAGNOSIS — R278 Other lack of coordination: Secondary | ICD-10-CM | POA: Diagnosis not present

## 2021-04-10 DIAGNOSIS — R208 Other disturbances of skin sensation: Secondary | ICD-10-CM

## 2021-04-10 DIAGNOSIS — G8254 Quadriplegia, C5-C7 incomplete: Secondary | ICD-10-CM | POA: Diagnosis not present

## 2021-04-10 DIAGNOSIS — G8253 Quadriplegia, C5-C7 complete: Secondary | ICD-10-CM | POA: Diagnosis not present

## 2021-04-10 DIAGNOSIS — R29818 Other symptoms and signs involving the nervous system: Secondary | ICD-10-CM | POA: Diagnosis not present

## 2021-04-10 DIAGNOSIS — M6281 Muscle weakness (generalized): Secondary | ICD-10-CM | POA: Diagnosis not present

## 2021-04-10 DIAGNOSIS — R2689 Other abnormalities of gait and mobility: Secondary | ICD-10-CM | POA: Diagnosis not present

## 2021-04-10 DIAGNOSIS — R293 Abnormal posture: Secondary | ICD-10-CM | POA: Diagnosis not present

## 2021-04-10 NOTE — Therapy (Signed)
[facility] [address] [place], [place], [address] Phone: [phone]   Fax:  [phone]  Occupational Therapy Treatment  Patient Details  Name: [name] MRN: [id number] Date of Birth: 1950/11/25 Referring Provider (OT): Ina Homes, MD   Encounter Date: 04/10/2021   OT End of Session - 04/10/21 1533     Visit Number 8    Number of Visits 25    Date for OT Re-Evaluation 06/05/21    Authorization Type [employer]    Authorization Time Period Auth Req'd - 16 visits for OT 03/13/21 - 06/08/21    Authorization - Visit Number 8    Authorization - Number of Visits 16    OT Start Time 1534    OT Stop Time 1615    OT Time Calculation (min) 41 min    Activity Tolerance Patient tolerated treatment well    Behavior During Therapy [facility] for tasks assessed/performed             Past Medical History:  Diagnosis Date   CERVICAL POLYP 03/11/2008   Qualifier: Diagnosis of  By: Regis Bill MD, Standley Brooking    Colon polyps 2005   on colonscopy Dr. Fuller Plan   Fibroid 2004   Per Dr. Ouida Sills   History of shingles    face and mouth   Hx of skin cancer, basal cell    Rosacea    Sciatica of left side 09/28/2013   Scoliosis    noted on mri done for back pain    Past Surgical History:  Procedure Laterality Date   BUNIONECTOMY      There were no vitals filed for this visit.   Subjective Assessment - 04/10/21 1535     Subjective  "think this finger is getting worse - we are starting a new caregiver"    Patient is accompanied by: Family member   husband, [name]   Pertinent History hyperlipidemia, scoliosis, sleep disorder    Patient Stated Goals regain arm strength for rolling over in bed and fine motor coordination for increasing typing    Currently in Pain? Yes    Pain Score 4     Pain Location Arm    Pain Orientation Right;Left    Pain Descriptors / Indicators Burning    Pain Type Neuropathic pain     Pain Onset More than a month ago    Pain Frequency Constant             Checked STGs. See below.  Medium Pegs  with LUE with following pattern with min difficulty for coordination and min drops.  Connect Four Chips stacking with RUE with min/mod difficulty. Used LUE to work on in Ecologist with translating chips from palm to fingertips with mod difficulty.                        OT Short Term Goals - 04/10/21 1534       OT SHORT TERM GOAL #1   Title Pt will be independent with HEP w CG assistance PRN    Time 4    Period Weeks    Status On-going    Target Date 04/13/21      OT SHORT TERM GOAL #2   Title Pt will verbalize understanding and report independence with CG assistance with ues of modalities at home with good safety.    Baseline has paraffin and Estim unit    Time 4  Period Weeks    Status On-going   pt using paraffin at home when able d/t time     OT SHORT TERM GOAL #3   Title Pt will increase Box and Blocks score with RUE to 30 blocks or greater    Baseline R 26 L 39    Time 4    Period Weeks    Status On-going   R 22 blocks - hands cramping 04/10/21     OT SHORT TERM GOAL #4   Title Pt will increase BUE tricep strength to 4/5 consistently for increasing ability to perform SB transfers with supervision/set up assistance only.    Baseline 3+/5 strength BUE    Time 4    Period Weeks    Status New      OT SHORT TERM GOAL #5   Title Pt will increase coordination in LUE to completing 9 hole peg test in 70 seconds or less.    Baseline L 75.13s, R unable    Time 4    Period Weeks    Status On-going   L 71s 04/10/21     OT SHORT TERM GOAL #6   Title Pt will verbalize understanding of adapted strategies and/or equipment for increasing indepenence with ADLs and IADLs (typing, bathing, cutting food, etc)    Baseline has U cuff    Time 4    Period Weeks    Status On-going               OT Long Term Goals - 03/16/21 0943        OT LONG TERM GOAL #1   Title Pt will be independence with any updated HEP    Time 12    Period Weeks    Status New    Target Date 06/08/21      OT LONG TERM GOAL #2   Title Pt will increase functional use of BUE evidenced by completing Box and Blocks with score of 35 or greater with RUE, 45 or greater with LUE.    Baseline R 26, L 39    Time 12    Period Weeks    Status New      OT LONG TERM GOAL #3   Title PT will improve grip strength in BUE by increasing grip strength to 10 lbs or greater with RUE and 20 lbs or greater with LUE.    Baseline R 1.5, L 12.3    Time 12    Period Weeks    Status New      OT LONG TERM GOAL #4   Title Pt will improve isolated finger movements in order to increase skill towards simple typing with adapted strategies and equipment PRN.    Baseline isolated movement in LUE    Time 12    Period Weeks    Status New      OT LONG TERM GOAL #5   Title Pt will improve 9 hole peg test in LUE to completing in 65 seconds or less in order to increase functional use and demonstrate ability to place 2 or more pegs with RUE.    Baseline L 75.13s, R unable    Time 12    Period Weeks    Status New      OT LONG TERM GOAL #6   Title Pt will report completing UB and LB dressing with decreased assistance consistently.    Baseline UB (reports able to do but not consistently doing), LB total A  Time 12    Period Weeks    Status New                   Plan - 04/10/21 1614     Clinical Impression Statement Pt progressing but limited by cramping in RUE today for Box and Blocks assessment.    OT Occupational Profile and History Detailed Assessment- Review of Records and additional review of physical, cognitive, psychosocial history related to current functional performance    Occupational performance deficits (Please refer to evaluation for details): ADL's;IADL's;Leisure;Work;Rest and Sleep    Body Structure / Function / Physical Skills  ADL;IADL;ROM;Strength;Decreased knowledge of use of DME;Dexterity;GMC;Pain;Tone;UE functional use;Body mechanics;Balance;Continence;FMC;Muscle spasms;Skin integrity;Flexibility;Mobility;Sensation;Improper spinal/pelvic alignment;Endurance    Rehab Potential Good    Clinical Decision Making Several treatment options, min-mod task modification necessary    Comorbidities Affecting Occupational Performance: May have comorbidities impacting occupational performance    Modification or Assistance to Complete Evaluation  Min-Moderate modification of tasks or assist with assess necessary to complete eval    OT Frequency 2x / week    OT Duration 12 weeks    OT Treatment/Interventions Self-care/ADL training;Moist Heat;Fluidtherapy;DME and/or AE instruction;Splinting;Therapeutic activities;Aquatic Therapy;Ultrasound;Therapeutic exercise;Cognitive remediation/compensation;Passive range of motion;Functional Mobility Training;Neuromuscular education;Electrical Stimulation;Paraffin;Manual Therapy;Patient/family education    Plan AE/strategies for ADLs; continue with UE strengthening and ROM, coordination BUE, in hand manipulation / rotation of items in hand    Consulted and Agree with Plan of Care Patient;Family member/caregiver    Family Member Consulted spouse [name]             Patient will benefit from skilled therapeutic intervention in order to improve the following deficits and impairments:   Body Structure / Function / Physical Skills: ADL, IADL, ROM, Strength, Decreased knowledge of use of DME, Dexterity, GMC, Pain, Tone, UE functional use, Body mechanics, Balance, Continence, FMC, Muscle spasms, Skin integrity, Flexibility, Mobility, Sensation, Improper spinal/pelvic alignment, Endurance       Visit Diagnosis: Muscle weakness (generalized)  Quadriplegia, C5-C7 incomplete (HCC)  Other lack of coordination  Other disturbances of skin sensation  Other symptoms and signs involving the nervous  system    Problem List Patient Active Problem List   Diagnosis Date Noted   Quadriplegia, C5-C7 incomplete ([place]) 01/16/2021   History of spinal fracture 01/16/2021   Suprapubic catheter ([place]) 01/16/2021   Encounter for routine gynecological examination 09/28/2013   Onychomycosis 09/28/2013   Foot deformity, acquired 03/26/2012   Encounter for preventive health examination 12/25/2010   ROSACEA 08/25/2009   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

## 2021-04-10 NOTE — Therapy (Signed)
[facility] [address] [place], [place], [address] Phone: [phone]   Fax:  [phone]  Physical Therapy Treatment  Patient Details  Name: [name] MRN: [id number] Date of Birth: Aug 31, 1950 Referring Provider (PT): Landis Gandy   Encounter Date: 04/10/2021   PT End of Session - 04/10/21 1534     Visit Number 15    Number of Visits 25    Date for PT Re-Evaluation 05/15/21    Authorization Type [employer] 25 visits 9/8-12/2/22    Authorization - Visit Number 15    Authorization - Number of Visits 25    Progress Note Due on Visit 20    PT Start Time 1450    PT Stop Time 1533    PT Time Calculation (min) 43 min    Equipment Utilized During Treatment Other (comment)   slideboard   Activity Tolerance Patient tolerated treatment well    Behavior During Therapy [facility] for tasks assessed/performed             Past Medical History:  Diagnosis Date   CERVICAL POLYP 03/11/2008   Qualifier: Diagnosis of  By: Regis Bill MD, Standley Brooking    Colon polyps 2005   on colonscopy Dr. Fuller Plan   Fibroid 2004   Per Dr. Ouida Sills   History of shingles    face and mouth   Hx of skin cancer, basal cell    Rosacea    Sciatica of left side 09/28/2013   Scoliosis    noted on mri done for back pain    Past Surgical History:  Procedure Laterality Date   BUNIONECTOMY      There were no vitals filed for this visit.   Subjective Assessment - 04/10/21 1455     Subjective Pt reports that an automatic lock in for car was added to her w/c bottom. Trialed getting on SciFit and unable to due to screw hanging down.    Patient is accompained by: Family member   husband, [name]   Pertinent History Pt also takes Toviaz 86m daily. PMH: hyperlipidemia, scoliosis, sleep disorder    Patient Stated Goals Pt would like to be able to walk even if its with assistance. She also wants to be able to type and improve her ability to do  ADLs to allow for more independence.    Currently in Pain? Yes    Pain Score 4     Pain Location Arm    Pain Orientation Right;Left    Pain Descriptors / Indicators Burning    Pain Type Neuropathic pain    Pain Onset More than a month ago    Pain Frequency Constant                               [facility] Adult PT Treatment/Exercise - 04/10/21 1457       Bed Mobility   Bed Mobility Sit to Supine;Rolling Right;Right Sidelying to Sit    Rolling Right Contact Guard/Touching assist;Minimal Assistance - Patient > 75%   PT assisted to position LLE in flexion on mat. Pt then utilized 3 rocks with throwing arms across body to perform roll to right. Peformed 3 different times with only CGA/supervision once leg up.   Right Sidelying to Sit Moderate Assistance - Patient 50-74%;Maximal Assistance - Patient 25-49%   To get legs off mat max assist then mod assist to get right elbow under her with pt completing  the movement after that.   Sit to Supine Moderate Assistance - Patient 50-74%   To lift legs on mat. Pt maintained long sit propping on hands during process. Then able to go back to elbows to let herself down.     Transfers   Transfers Lateral/Scoot Transfers    Lateral/Scoot Transfers 4: Min guard;4: Medical illustrator Details (indicate cue type and reason) Pt transferred powerchair to mat with slight downhill slope CGA after PT placed slideboard. Pt cued therapist to adjust feet half way through. With return to mat pt needed min assist as going uphill some. Cues to use head hips ratio to help with movement.      Therapeutic Activites    Therapeutic Activities Other Therapeutic Activities    Other Therapeutic Activities Scooting over on mat in supine to get to center more: pt pushed through elbows with PT assisting to move at pelvis with slight lift max assist. Pt then slide left leg over min assist and PT brough right leg max assist. Pt worked on coming from  supine to sit with rolling right first and then walking around on arms in C. Pt was mod assist to get right elbow under her to begin then able to walk around some towards legs min/mod assist to move right arm forwards. Once in "C" position, pt anchored on left leg with left arm to help pull herself up needed mod assisting to initiate elbow extension on right and then completed on own. Once up, needed min assist to start left leg sliding down in extension. Performed x 2 reps. In long sit position worked on anchoring left hand under left leg and then coming down partially to right with elbow flexion part way then pusing back up through right arm with triceps x 10 supervision/CGA at right arm.      Exercises   Exercises Other Exercises    Other Exercises  Long sit hamstring stretch 30 sec x 2.                       PT Short Term Goals - 03/24/21 2022       PT SHORT TERM GOAL #1   Title Pt will be able to perform initial HEP for stretching and strengthening with caregiver.    Baseline pt reports "we are starting to work on them"    Time 4    Period Weeks    Status On-going    Target Date 04/17/21      PT SHORT TERM GOAL #2   Title Pt will be able to perform slideboard transfer on level surface supervision after no more than min assist to help with board placement.    Baseline Pt required max assist for board placement in wheelchair and on mat -  03-19-21    Time 4    Period Weeks    Status On-going    Target Date 04/17/21      PT SHORT TERM GOAL #3   Title Pt will be able to perform rolling to each side supervision for improved bed mobility.    Baseline Pt able to roll to Lt side with supervision after RLE positioned in flexion; needed CGA to min assist to roll toward Rt side, needs assist to fully roll Lt pelvis from supine to sidelying; assist varies depending on fatigue; needs max assist to position each leg in flexion - 03-19-21    Time 4    Period Weeks  Status On-going     Target Date 04/17/21      PT SHORT TERM GOAL #4   Title Pt will be able to perform sit to/from supine transfer mod assist for improved bed mobility.    Baseline needs assist to transfer LE's onto mat for sit to supine; able to hook LUE to pull up to sitting from supine to long sitting    Time 4    Period Weeks    Status Achieved    Target Date 03/20/21      PT SHORT TERM GOAL #5   Title Pt will be able to maintain sitting balance edge of mat with only feet supported x 5 min while performing UE movements for improved sitting balance/trunk stability to assist with sitting ADLs.    Baseline met 03-19-21    Time 4    Period Weeks    Status Achieved    Target Date 03/20/21               PT Long Term Goals - 03/19/21 1925       PT LONG TERM GOAL #1   Title Pt will be able to perform progressive HEP for strengthening, stretching and balance to continue gains on own. (LTGs due 05/15/21)    Time 12    Period Weeks    Status New      PT LONG TERM GOAL #2   Title Pt will be able to perform squat pivot transfer/lateral scoot transfer without slideboard CGA for improved mobility.    Time 12    Period Weeks    Status New      PT LONG TERM GOAL #3   Title Pt will be able to stand at counter x 2 min min assist for improved standing ability.    Time 12    Period Weeks    Status New      PT LONG TERM GOAL #4   Title Pt will be able to perform all bed mobility CGA for improved function.    Time 12    Period Weeks    Status New      PT LONG TERM GOAL #5   Title Pt will report being able to perform 20 minutes of manual w/c propulsion around home for improved UE strength and mobility. (should be getting manual chair soon)    Time 12    Period Weeks    Status New                   Plan - 04/10/21 1800     Clinical Impression Statement PT began working with pt on coming up to long sit with "C" technique. Able to perform mod assist.    Personal Factors and  Comorbidities Comorbidity 2    Comorbidities scoliosis and sleep disorder    Examination-Activity Limitations Bed Mobility;Locomotion Level;Transfers;Stand;Bathing;Dressing    Examination-Participation Freight forwarder;Yard Work    Merchant navy officer Evolving/Moderate complexity    Rehab Potential Good    PT Frequency 2x / week   plus eval   PT Duration 12 weeks    PT Treatment/Interventions ADLs/Self Care Home Management;Electrical Stimulation;DME Instruction;Neuromuscular re-education;Manual techniques;Therapeutic exercise;Balance training;Therapeutic activities;Cryotherapy;Moist Heat;Functional mobility training;Stair training;Gait training;Patient/family education;Orthotic Fit/Training;Wheelchair mobility training;Dry needling;Passive range of motion;Vestibular    PT Next Visit Plan STG check due next week. Continue SciFit with training rehab tech starting before session to get more aerobic activity and strengthening when possible if bolt on bottom of chair is changed or are  we able to transfer on? Will decide at session prior each visit. Slideboard transfer training working towards more level/uphill transfer, bed mobility. Continue to work on functional strengthening for UE, prone position for scapular strengthening. Short sit balance activities to try to engage core some as well as balance with maintaining long sit.  At some point may try bioness to try to get more quad activation with exercises and possibly with standing frame. Pt does have standing frame at home - have encouraged her to use more daily    Consulted and Agree with Plan of Care Patient;Family member/caregiver    Family Member Consulted husband, [name]             Patient will benefit from skilled therapeutic intervention in order to improve the following deficits and impairments:  Decreased balance, Decreased mobility, Decreased strength, Impaired sensation, Postural dysfunction,  Impaired flexibility, Impaired UE functional use, Impaired tone, Decreased range of motion  Visit Diagnosis: Muscle weakness (generalized)  Quadriplegia, C5-C7 incomplete ([place])     Problem List Patient Active Problem List   Diagnosis Date Noted   Quadriplegia, C5-C7 incomplete ([place]) 01/16/2021   History of spinal fracture 01/16/2021   Suprapubic catheter ([place]) 01/16/2021   Encounter for routine gynecological examination 09/28/2013   Onychomycosis 09/28/2013   Foot deformity, acquired 03/26/2012   Encounter for preventive health examination 12/25/2010   ROSACEA 08/25/2009   Disturbance in sleep behavior 03/11/2008   SKIN CANCER, HX OF 03/11/2008   DYSURIA, HX OF 03/11/2008   Hyperlipidemia 02/10/2007   CERVICALGIA 02/10/2007    Electa Sniff, PT, DPT, NCS 04/10/2021, 6:03 PM  [facility] [address] [place], [place], [address] Phone: [phone]   Fax:  [phone]  Name: [name] MRN: [id number] Date of Birth: 30-Apr-1951

## 2021-04-14 ENCOUNTER — Ambulatory Visit: Payer: Medicare PPO | Admitting: Occupational Therapy

## 2021-04-14 ENCOUNTER — Encounter: Payer: Self-pay | Admitting: Occupational Therapy

## 2021-04-14 ENCOUNTER — Ambulatory Visit: Payer: Medicare PPO | Attending: Physical Medicine and Rehabilitation

## 2021-04-14 ENCOUNTER — Other Ambulatory Visit: Payer: Self-pay

## 2021-04-14 DIAGNOSIS — M6281 Muscle weakness (generalized): Secondary | ICD-10-CM

## 2021-04-14 DIAGNOSIS — R293 Abnormal posture: Secondary | ICD-10-CM

## 2021-04-14 DIAGNOSIS — R208 Other disturbances of skin sensation: Secondary | ICD-10-CM | POA: Diagnosis not present

## 2021-04-14 DIAGNOSIS — R278 Other lack of coordination: Secondary | ICD-10-CM

## 2021-04-14 DIAGNOSIS — R2689 Other abnormalities of gait and mobility: Secondary | ICD-10-CM | POA: Diagnosis not present

## 2021-04-14 DIAGNOSIS — G8253 Quadriplegia, C5-C7 complete: Secondary | ICD-10-CM | POA: Diagnosis not present

## 2021-04-14 DIAGNOSIS — N312 Flaccid neuropathic bladder, not elsewhere classified: Secondary | ICD-10-CM | POA: Diagnosis not present

## 2021-04-14 DIAGNOSIS — G8254 Quadriplegia, C5-C7 incomplete: Secondary | ICD-10-CM | POA: Insufficient documentation

## 2021-04-14 DIAGNOSIS — R29818 Other symptoms and signs involving the nervous system: Secondary | ICD-10-CM | POA: Diagnosis not present

## 2021-04-14 NOTE — Therapy (Signed)
[facility] [address] [place], [place], [address] Phone: [phone]   Fax:  [phone]  Physical Therapy Treatment  Patient Details  Name: [name] MRN: [id number] Date of Birth: 1951/05/13 Referring Provider (PT): Landis Gandy   Encounter Date: 04/14/2021   PT End of Session - 04/14/21 1151     Visit Number 16    Number of Visits 25    Date for PT Re-Evaluation 05/15/21    Authorization Type [employer] 25 visits 9/8-12/2/22    Authorization - Visit Number 16    Authorization - Number of Visits 25    Progress Note Due on Visit 20    PT Start Time 1149    PT Stop Time 1232    PT Time Calculation (min) 43 min    Equipment Utilized During Treatment Other (comment)   slideboard   Activity Tolerance Patient tolerated treatment well    Behavior During Therapy [facility] for tasks assessed/performed             Past Medical History:  Diagnosis Date   CERVICAL POLYP 03/11/2008   Qualifier: Diagnosis of  By: Regis Bill MD, Standley Brooking    Colon polyps 2005   on colonscopy Dr. Fuller Plan   Fibroid 2004   Per Dr. Ouida Sills   History of shingles    face and mouth   Hx of skin cancer, basal cell    Rosacea    Sciatica of left side 09/28/2013   Scoliosis    noted on mri done for back pain    Past Surgical History:  Procedure Laterality Date   BUNIONECTOMY      There were no vitals filed for this visit.   Subjective Assessment - 04/14/21 1151     Subjective Pt reports that she had some trouble with the automatic lock on the powerchair getting in and then husband had to manually disengage getting out.    Patient is accompained by: Family member   husband, [name]   Pertinent History Pt also takes Toviaz 5m daily. PMH: hyperlipidemia, scoliosis, sleep disorder    Patient Stated Goals Pt would like to be able to walk even if its with assistance. She also wants to be able to type and improve her ability to  do ADLs to allow for more independence.    Currently in Pain? Yes    Pain Score 4     Pain Location Arm    Pain Descriptors / Indicators Burning    Pain Type Neuropathic pain    Pain Onset More than a month ago    Pain Frequency Constant                               [facility] Adult PT Treatment/Exercise - 04/14/21 1153       Bed Mobility   Bed Mobility Rolling Right;Rolling Left    Rolling Right Minimal Assistance - Patient > 75%;Supervision/verbal cueing   min assist to get leg into flexed position and then able to complete rolling using arms with momentum as described in ther act section.   Rolling Left Minimal Assistance - Patient > 75%;Supervision/Verbal cueing   min assist to get leg into flexed position and then able to complete rolling using arms with momentum as described in ther act section.     Transfers   Transfers Lateral/Scoot Transfers    Lateral/Scoot Transfers 4: Min guard;4: Min assist;3: Mod assist  Lateral/Scoot Transfer Details (indicate cue type and reason) powerchair to mat downhill supervision after PT placed board other than asking therapist to reposition right foot mid transfer. With return to powerchair uphill pt needed mod assist to get started on board uphill. Pt continues to show improved ability to get all the way back in chair.      Therapeutic Activites    Therapeutic Activities Other Therapeutic Activities    Other Therapeutic Activities Pt transferred short sit to supine with PT assisting legs on to mat max assist but pt maintaining long sit position and then coming back on elbows to let herself down. Rolling practice: trialed rolling in both directions with legs initially extended using arms for momentum and rocking across body. Needed min assist at pelvis to come fully over. 3 trials each side. Then performed with ankles crossed with again needing min assist at pelvis to get over in both directions. For rolling right but tried to flex  left leg up on own with shoe removed and pillowcase under foot but still needed min assist to get foot up. Then able to roll right supervision once leg flexed. For rolling left pt needed max assist to flex up right leg but then able to perform the roll on her own using arms for momentum. Supine to long sit mod assist of PT to get up on elbows and then to weight shift far enough to one side to throw right arm back in extension and then to right to get left arm back to fully prop up. Breaking down using "C" walk to come supine to sit starting in long sit and coming down to side with cues to stay forward and come down on both forearms and then reach for leg with contralateral hand to help with pushing up to long sit. Performed x 3 to each side. Needed min assist on left side to stabilize at left elbow mostly to prevent it from sliding back. Pt did have some sciatic pain when coming down to right for prolonged periods which is chronic issue. Long sit to edge of mat with pt assisting to move legs to right with arms. Pt needed max assist on RLE but able to move LLE on own. Able to scoot to edge of max supervision once legs off.      Exercises   Exercises Other Exercises    Other Exercises  In long sit: self hamstring stretch with leaning forward over legs 30 sec x 2.                       PT Short Term Goals - 03/24/21 2022       PT SHORT TERM GOAL #1   Title Pt will be able to perform initial HEP for stretching and strengthening with caregiver.    Baseline pt reports "we are starting to work on them"    Time 4    Period Weeks    Status On-going    Target Date 04/17/21      PT SHORT TERM GOAL #2   Title Pt will be able to perform slideboard transfer on level surface supervision after no more than min assist to help with board placement.    Baseline Pt required max assist for board placement in wheelchair and on mat -  03-19-21    Time 4    Period Weeks    Status On-going    Target Date  04/17/21      PT SHORT  TERM GOAL #3   Title Pt will be able to perform rolling to each side supervision for improved bed mobility.    Baseline Pt able to roll to Lt side with supervision after RLE positioned in flexion; needed CGA to min assist to roll toward Rt side, needs assist to fully roll Lt pelvis from supine to sidelying; assist varies depending on fatigue; needs max assist to position each leg in flexion - 03-19-21    Time 4    Period Weeks    Status On-going    Target Date 04/17/21      PT SHORT TERM GOAL #4   Title Pt will be able to perform sit to/from supine transfer mod assist for improved bed mobility.    Baseline needs assist to transfer LE's onto mat for sit to supine; able to hook LUE to pull up to sitting from supine to long sitting    Time 4    Period Weeks    Status Achieved    Target Date 03/20/21      PT SHORT TERM GOAL #5   Title Pt will be able to maintain sitting balance edge of mat with only feet supported x 5 min while performing UE movements for improved sitting balance/trunk stability to assist with sitting ADLs.    Baseline met 03-19-21    Time 4    Period Weeks    Status Achieved    Target Date 03/20/21               PT Long Term Goals - 03/19/21 1925       PT LONG TERM GOAL #1   Title Pt will be able to perform progressive HEP for strengthening, stretching and balance to continue gains on own. (LTGs due 05/15/21)    Time 12    Period Weeks    Status New      PT LONG TERM GOAL #2   Title Pt will be able to perform squat pivot transfer/lateral scoot transfer without slideboard CGA for improved mobility.    Time 12    Period Weeks    Status New      PT LONG TERM GOAL #3   Title Pt will be able to stand at counter x 2 min min assist for improved standing ability.    Time 12    Period Weeks    Status New      PT LONG TERM GOAL #4   Title Pt will be able to perform all bed mobility CGA for improved function.    Time 12    Period Weeks     Status New      PT LONG TERM GOAL #5   Title Pt will report being able to perform 20 minutes of manual w/c propulsion around home for improved UE strength and mobility. (should be getting manual chair soon)    Time 12    Period Weeks    Status New                   Plan - 04/14/21 1355     Clinical Impression Statement Pt continues to show improvement in rolling ability with less assistance. Only needs PT to assist contralateral leg in to flexion prior to rolling. Pt did well with breaking down tasks with coming down on elbows in long sit and returning upright.    Personal Factors and Comorbidities Comorbidity 2    Comorbidities scoliosis and sleep disorder    Examination-Activity Limitations Bed  Mobility;Locomotion Level;Transfers;Stand;Bathing;Dressing    Examination-Participation Freight forwarder;Yard Work    [employer]chant [employer] officer Evolving/Moderate complexity    Rehab Potential Good    PT Frequency 2x / week   plus eval   PT Duration 12 weeks    PT Treatment/Interventions ADLs/Self Care Home Management;Electrical Stimulation;DME Instruction;Neuromuscular re-education;Manual techniques;Therapeutic exercise;Balance training;Therapeutic activities;Cryotherapy;Moist Heat;Functional mobility training;Stair training;Gait training;Patient/family education;Orthotic Fit/Training;Wheelchair mobility training;Dry needling;Passive range of motion;Vestibular    PT Next Visit Plan STG check next visit. Continue SciFit with training rehab tech starting before session to get more aerobic activity and strengthening when possible if bolt on bottom of chair is changed or are we able to transfer on? Will decide at session prior each visit. Slideboard transfer training working towards more level/uphill transfer, bed mobility. Continue to work on functional strengthening for UE, prone position for scapular strengthening. Short sit balance activities to try  to engage core some as well as balance with maintaining long sit.  At some point may try bioness to try to get more quad activation with exercises and possibly with standing frame. Pt does have standing frame at home - have encouraged her to use more daily    Consulted and Agree with Plan of Care Patient;Family member/caregiver    Family Member Consulted husband, [name]             Patient will benefit from skilled therapeutic intervention in order to improve the following deficits and impairments:  Decreased balance, Decreased mobility, Decreased strength, Impaired sensation, Postural dysfunction, Impaired flexibility, Impaired UE functional use, Impaired tone, Decreased range of motion  Visit Diagnosis: Muscle weakness (generalized)  Quadriplegia, C5-C7 incomplete ([place])     Problem List Patient Active Problem List   Diagnosis Date Noted   Quadriplegia, C5-C7 incomplete ([place]) 01/16/2021   History of spinal fracture 01/16/2021   Suprapubic catheter ([place]) 01/16/2021   Encounter for routine gynecological examination 09/28/2013   Onychomycosis 09/28/2013   Foot deformity, acquired 03/26/2012   Encounter for preventive health examination 12/25/2010   ROSACEA 08/25/2009   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 [place], [place], [address] Phone: [phone]   Fax:  [phone]  Name: [name] MRN: [id number] Date of Birth: Nov 15, 1950

## 2021-04-14 NOTE — Therapy (Signed)
[facility] [address] [place], [place], [address] Phone: [phone]   Fax:  [phone]  Occupational Therapy Treatment  Patient Details  Name: [name] MRN: [id number] Date of Birth: 05/07/1951 Referring Provider (OT): Ina Homes, MD   Encounter Date: 04/14/2021   OT End of Session - 04/14/21 1230     Visit Number 9    Number of Visits 25    Date for OT Re-Evaluation 06/05/21    Authorization Type [employer]    Authorization Time Period Auth Req'd - 16 visits for OT 03/13/21 - 06/08/21    Authorization - Visit Number 9    Authorization - Number of Visits 16    OT Start Time 1233    OT Stop Time 1315    OT Time Calculation (min) 42 min    Activity Tolerance Patient tolerated treatment well    Behavior During Therapy [facility] for tasks assessed/performed             Past Medical History:  Diagnosis Date   CERVICAL POLYP 03/11/2008   Qualifier: Diagnosis of  By: Regis Bill MD, Standley Brooking    Colon polyps 2005   on colonscopy Dr. Fuller Plan   Fibroid 2004   Per Dr. Ouida Sills   History of shingles    face and mouth   Hx of skin cancer, basal cell    Rosacea    Sciatica of left side 09/28/2013   Scoliosis    noted on mri done for back pain    Past Surgical History:  Procedure Laterality Date   BUNIONECTOMY      There were no vitals filed for this visit.   Subjective Assessment - 04/14/21 1230     Subjective  "I'm still worried about these bending fingers"    Patient is accompanied by: Family member   husband, [name]   Pertinent History hyperlipidemia, scoliosis, sleep disorder    Patient Stated Goals regain arm strength for rolling over in bed and fine motor coordination for increasing typing    Currently in Pain? Yes    Pain Score 4     Pain Location Arm    Pain Orientation Right;Left    Pain Descriptors / Indicators Burning    Pain Onset More than a month ago    Pain Frequency  Constant    Aggravating Factors  nothing    Pain Relieving Factors nothing              Began discussing AE/strategies for ADLs/IADLs.  Pt reports that she can't cut food.  Discussed rocker knife.  Pt has multiple but has not used/practiced consistently.  Pt reports eating with regular utensil at times, but not consistently.  Discussed accessibility features on phone (pt doing well with this).  Pt reports grooming going well.  Pt reports that bathroom has been modified and that bathing going well, but needs assist for getting shampoo in hand.  Recommended pump (which pt has) and suction shelf to place it on.  Pt reports that she is able to don shirt and bra with difficulty/incr time.  Pt reports that she has not attempted LB dressing in a while.  Pt reports that she would like to sew eventually and that she needs to be able to use scissors--recommended spring-loaded scissors (trial next visit?).  Pt reports typing as able, but position of fingers and decr individual finger movement interfere can "peck" with certain fingers and uses stylus at times.  Placing medium  pegs in pegboard with L hand with cueing/focus on sliding/translating peg in hand to initiate in-hand manipulation with mod-max difficulty for manipultion.  Placing medium pegs in pegboard with R hand with mod difficulty for incr coordination with cueing/focus on pinch.  Attempts to manipulate, translate 1-inch cube in L hand with mod-max cueing and difficulty, improved with repetition/cues.  Recommended pt work on at home with bottle cap (and attempted in clinic).        OT Short Term Goals - 04/14/21 1502       OT SHORT TERM GOAL #1   Title Pt will be independent with HEP w CG assistance PRN    Time 4    Period Weeks    Status On-going    Target Date 04/13/21      OT SHORT TERM GOAL #2   Title Pt will verbalize understanding and report independence with CG assistance with ues of modalities at home with good safety.     Baseline has paraffin and Estim unit    Time 4    Period Weeks    Status On-going   pt using paraffin at home when able d/t time     OT SHORT TERM GOAL #3   Title Pt will increase Box and Blocks score with RUE to 30 blocks or greater    Baseline R 26 L 39    Time 4    Period Weeks    Status On-going   R 22 blocks - hands cramping 04/10/21     OT SHORT TERM GOAL #4   Title Pt will increase BUE tricep strength to 4/5 consistently for increasing ability to perform SB transfers with supervision/set up assistance only.    Baseline 3+/5 strength BUE    Time 4    Period Weeks    Status New      OT SHORT TERM GOAL #5   Title Pt will increase coordination in LUE to completing 9 hole peg test in 70 seconds or less.    Baseline L 75.13s, R unable    Time 4    Period Weeks    Status On-going   L 71s 04/10/21     OT SHORT TERM GOAL #6   Title Pt will verbalize understanding of adapted strategies and/or equipment for increasing indepenence with ADLs and IADLs (typing, bathing, cutting food, etc)    Baseline has U cuff    Time 4    Period Weeks    Status On-going               OT Long Term Goals - 03/16/21 0943       OT LONG TERM GOAL #1   Title Pt will be independence with any updated HEP    Time 12    Period Weeks    Status New    Target Date 06/08/21      OT LONG TERM GOAL #2   Title Pt will increase functional use of BUE evidenced by completing Box and Blocks with score of 35 or greater with RUE, 45 or greater with LUE.    Baseline R 26, L 39    Time 12    Period Weeks    Status New      OT LONG TERM GOAL #3   Title PT will improve grip strength in BUE by increasing grip strength to 10 lbs or greater with RUE and 20 lbs or greater with LUE.    Baseline R 1.5, L 12.3  Time 12    Period Weeks    Status New      OT LONG TERM GOAL #4   Title Pt will improve isolated finger movements in order to increase skill towards simple typing with adapted strategies and  equipment PRN.    Baseline isolated movement in LUE    Time 12    Period Weeks    Status New      OT LONG TERM GOAL #5   Title Pt will improve 9 hole peg test in LUE to completing in 65 seconds or less in order to increase functional use and demonstrate ability to place 2 or more pegs with RUE.    Baseline L 75.13s, R unable    Time 12    Period Weeks    Status New      OT LONG TERM GOAL #6   Title Pt will report completing UB and LB dressing with decreased assistance consistently.    Baseline UB (reports able to do but not consistently doing), LB total A    Time 12    Period Weeks    Status New                   Plan - 04/14/21 1230     Clinical Impression Statement Pt is now able to initiate some in-hand manipulation with difficulty with L hand.  Pt is progressing towards goals.    OT Occupational Profile and History Detailed Assessment- Review of Records and additional review of physical, cognitive, psychosocial history related to current functional performance    Occupational performance deficits (Please refer to evaluation for details): ADL's;IADL's;Leisure;Work;Rest and Sleep    Body Structure / Function / Physical Skills ADL;IADL;ROM;Strength;Decreased knowledge of use of DME;Dexterity;GMC;Pain;Tone;UE functional use;Body mechanics;Balance;Continence;FMC;Muscle spasms;Skin integrity;Flexibility;Mobility;Sensation;Improper spinal/pelvic alignment;Endurance    Rehab Potential Good    Clinical Decision Making Several treatment options, min-mod task modification necessary    Comorbidities Affecting Occupational Performance: May have comorbidities impacting occupational performance    Modification or Assistance to Complete Evaluation  Min-Moderate modification of tasks or assist with assess necessary to complete eval    OT Frequency 2x / week    OT Duration 12 weeks    OT Treatment/Interventions Self-care/ADL training;Moist Heat;Fluidtherapy;DME and/or AE  instruction;Splinting;Therapeutic activities;Aquatic Therapy;Ultrasound;Therapeutic exercise;Cognitive remediation/compensation;Passive range of motion;Functional Mobility Training;Neuromuscular education;Electrical Stimulation;Paraffin;Manual Therapy;Patient/family education    Plan Progress Note; try spring loaded scissors; try lower body dressing with AE prn    Consulted and Agree with Plan of Care Patient;Family member/caregiver    Family Member Consulted spouse [name]             Patient will benefit from skilled therapeutic intervention in order to improve the following deficits and impairments:   Body Structure / Function / Physical Skills: ADL, IADL, ROM, Strength, Decreased knowledge of use of DME, Dexterity, GMC, Pain, Tone, UE functional use, Body mechanics, Balance, Continence, FMC, Muscle spasms, Skin integrity, Flexibility, Mobility, Sensation, Improper spinal/pelvic alignment, Endurance       Visit Diagnosis: Muscle weakness (generalized)  Other lack of coordination  Other disturbances of skin sensation  Other symptoms and signs involving the nervous system  Abnormal posture    Problem List Patient Active Problem List   Diagnosis Date Noted   Quadriplegia, C5-C7 incomplete ([place]) 01/16/2021   History of spinal fracture 01/16/2021   Suprapubic catheter ([place]) 01/16/2021   Encounter for routine gynecological examination 09/28/2013   Onychomycosis 09/28/2013   Foot deformity, acquired 03/26/2012   Encounter for preventive health  examination 12/25/2010   ROSACEA 08/25/2009   Disturbance in sleep behavior 03/11/2008   SKIN CANCER, HX OF 03/11/2008   DYSURIA, HX OF 03/11/2008   Hyperlipidemia 02/10/2007   CERVICALGIA 02/10/2007    Keeghan Mcintire, OT/L 04/14/2021, 3:03 PM  [facility] [address] [place], [place], [address] Phone: [phone]   Fax:  [phone]  Name: [name] MRN: [id number] Date of Birth: 1950/12/10  Vianne Bulls, OTR/L [facility] [address] [place], [place]  [address] [phone] phone [phone] 04/14/21 3:11 PM

## 2021-04-16 ENCOUNTER — Encounter: Payer: Self-pay | Admitting: Occupational Therapy

## 2021-04-16 ENCOUNTER — Ambulatory Visit: Payer: Medicare PPO

## 2021-04-16 ENCOUNTER — Ambulatory Visit: Payer: Medicare PPO | Admitting: Occupational Therapy

## 2021-04-16 ENCOUNTER — Other Ambulatory Visit: Payer: Self-pay

## 2021-04-16 DIAGNOSIS — R278 Other lack of coordination: Secondary | ICD-10-CM

## 2021-04-16 DIAGNOSIS — G8254 Quadriplegia, C5-C7 incomplete: Secondary | ICD-10-CM

## 2021-04-16 DIAGNOSIS — G8253 Quadriplegia, C5-C7 complete: Secondary | ICD-10-CM | POA: Diagnosis not present

## 2021-04-16 DIAGNOSIS — R293 Abnormal posture: Secondary | ICD-10-CM | POA: Diagnosis not present

## 2021-04-16 DIAGNOSIS — R29818 Other symptoms and signs involving the nervous system: Secondary | ICD-10-CM

## 2021-04-16 DIAGNOSIS — R208 Other disturbances of skin sensation: Secondary | ICD-10-CM | POA: Diagnosis not present

## 2021-04-16 DIAGNOSIS — M6281 Muscle weakness (generalized): Secondary | ICD-10-CM

## 2021-04-16 DIAGNOSIS — R2689 Other abnormalities of gait and mobility: Secondary | ICD-10-CM | POA: Diagnosis not present

## 2021-04-16 NOTE — Therapy (Signed)
[facility] [address] [place], [place], [address] Phone: [phone]   Fax:  [phone]  Occupational Therapy Treatment & 10th visit Progress Note  Patient Details  Name: [name] MRN: [id number] Date of Birth: 1950-10-27 Referring Provider (OT): Ina Homes, MD   Encounter Date: 04/16/2021   OT End of Session - 04/16/21 1106     Visit Number 10    Number of Visits 25    Date for OT Re-Evaluation 06/05/21    Authorization Type [employer]    Authorization Time Period Auth Req'd - 16 visits for OT 03/13/21 - 06/08/21    Authorization - Visit Number 10    Authorization - Number of Visits 16    OT Start Time 1106    OT Stop Time 1145    OT Time Calculation (min) 39 min    Activity Tolerance Patient tolerated treatment well    Behavior During Therapy [facility] for tasks assessed/performed             Occupational Therapy Progress Note  Dates of Reporting Period: 03/13/21 to 04/16/21  Objective Reports of Subjective Statement: Pt has reported increased use of BUE hands since start of OT.  Objective Measurements: Pt has improved with functional use of BUE as evidenced by Box and Blocks and 9 hole peg test per goals.  Goal Update: Pt has 3/6 STGs at this time and is progressing well towards meeting goals. Pt has demonstrated progress and is motivated.  Plan: Continue progressing towards goals.  Reason Skilled Services are Required: Skilled occupational therapy is recommended to target listed areas and continue to increase functional use of BUE and increase independence.   Past Medical History:  Diagnosis Date   CERVICAL POLYP 03/11/2008   Qualifier: Diagnosis of  By: Regis Bill MD, Standley Brooking    Colon polyps 2005   on colonscopy Dr. Fuller Plan   Fibroid 2004   Per Dr. Ouida Sills   History of shingles    face and mouth   Hx of skin cancer, basal cell    Rosacea    Sciatica of left side  09/28/2013   Scoliosis    noted on mri done for back pain    Past Surgical History:  Procedure Laterality Date   BUNIONECTOMY      There were no vitals filed for this visit.   Subjective Assessment - 04/16/21 1112     Subjective  "just the normal pain" - "I can do an unassisted roll from my left side with the leg loop"    Patient is accompanied by: Family member   caregiver, [name]   Pertinent History hyperlipidemia, scoliosis, sleep disorder    Patient Stated Goals regain arm strength for rolling over in bed and fine motor coordination for increasing typing    Currently in Pain? Yes    Pain Score 4     Pain Location Arm    Pain Orientation Right;Left    Pain Descriptors / Indicators Burning    Pain Type Neuropathic pain    Pain Onset More than a month ago    Pain Frequency Constant              Checked goals. See below.  ESTIM pt brought in personal estim unit and let run for 10 minutes with education on unit, placement, and run time for patient and caregiver. Verbalized understanding. X 20-22 intensity for wrist flexion and finger flexion.  Spring loaded scissors trialed kitchen shears and  children spring loaded - pt is left handed so had hard time with having to use right hand with spring loaded shears but was able to use strength from left hand and use right handed and cut styrofoam plate with therapist holding. Pt able to use children's on left hand with cutting smaller pieces of styrofoam plate.                      OT Short Term Goals - 04/16/21 1116       OT SHORT TERM GOAL #1   Title Pt will be independent with HEP w CG assistance PRN    Time 4    Period Weeks    Status On-going    Target Date 04/13/21      OT SHORT TERM GOAL #2   Title Pt will verbalize understanding and report independence with CG assistance with ues of modalities at home with good safety.    Baseline has paraffin and Estim unit    Time 4    Period Weeks    Status  Achieved   pt has new caregiver she is training for paraffin - reviewed and demonstrated understanding of estim unit     OT SHORT TERM GOAL #3   Title Pt will increase Box and Blocks score with RUE to 30 blocks or greater    Baseline R 26 L 39    Time 4    Period Weeks    Status On-going   R 22 blocks  04/16/21     OT SHORT TERM GOAL #4   Title Pt will increase BUE tricep strength to 4/5 consistently for increasing ability to perform SB transfers with supervision/set up assistance only.    Baseline 3+/5 strength BUE    Time 4    Period Weeks    Status Achieved      OT SHORT TERM GOAL #5   Title Pt will increase coordination in LUE to completing 9 hole peg test in 70 seconds or less.    Baseline L 75.13s, R unable    Time 4    Period Weeks    Status Achieved   L 63.87s 04/16/21     OT SHORT TERM GOAL #6   Title Pt will verbalize understanding of adapted strategies and/or equipment for increasing indepenence with ADLs and IADLs (typing, bathing, cutting food, etc)    Baseline has U cuff    Time 4    Period Weeks    Status On-going   has verbalized understanding of a lot of AE but continues to benefit from education              OT Long Term Goals - 03/16/21 0943       OT LONG TERM GOAL #1   Title Pt will be independence with any updated HEP    Time 12    Period Weeks    Status New    Target Date 06/08/21      OT LONG TERM GOAL #2   Title Pt will increase functional use of BUE evidenced by completing Box and Blocks with score of 35 or greater with RUE, 45 or greater with LUE.    Baseline R 26, L 39    Time 12    Period Weeks    Status New      OT LONG TERM GOAL #3   Title PT will improve grip strength in BUE by increasing grip strength to 10 lbs or greater  with RUE and 20 lbs or greater with LUE.    Baseline R 1.5, L 12.3    Time 12    Period Weeks    Status New      OT LONG TERM GOAL #4   Title Pt will improve isolated finger movements in order to increase  skill towards simple typing with adapted strategies and equipment PRN.    Baseline isolated movement in LUE    Time 12    Period Weeks    Status New      OT LONG TERM GOAL #5   Title Pt will improve 9 hole peg test in LUE to completing in 65 seconds or less in order to increase functional use and demonstrate ability to place 2 or more pegs with RUE.    Baseline L 75.13s, R unable    Time 12    Period Weeks    Status New      OT LONG TERM GOAL #6   Title Pt will report completing UB and LB dressing with decreased assistance consistently.    Baseline UB (reports able to do but not consistently doing), LB total A    Time 12    Period Weeks    Status New                   Plan - 04/16/21 1110     Clinical Impression Statement See progress note report above. Pt has met 3/6 STGs and progressing towards meeting remaining goals. Pt has demonstrated increased functional use and coordination and strength with BUE.    OT Occupational Profile and History Detailed Assessment- Review of Records and additional review of physical, cognitive, psychosocial history related to current functional performance    Occupational performance deficits (Please refer to evaluation for details): ADL's;IADL's;Leisure;Work;Rest and Sleep    Body Structure / Function / Physical Skills ADL;IADL;ROM;Strength;Decreased knowledge of use of DME;Dexterity;GMC;Pain;Tone;UE functional use;Body mechanics;Balance;Continence;FMC;Muscle spasms;Skin integrity;Flexibility;Mobility;Sensation;Improper spinal/pelvic alignment;Endurance    Rehab Potential Good    Clinical Decision Making Several treatment options, min-mod task modification necessary    Comorbidities Affecting Occupational Performance: May have comorbidities impacting occupational performance    Modification or Assistance to Complete Evaluation  Min-Moderate modification of tasks or assist with assess necessary to complete eval    OT Frequency 2x / week    OT  Duration 12 weeks    OT Treatment/Interventions Self-care/ADL training;Moist Heat;Fluidtherapy;DME and/or AE instruction;Splinting;Therapeutic activities;Aquatic Therapy;Ultrasound;Therapeutic exercise;Cognitive remediation/compensation;Passive range of motion;Functional Mobility Training;Neuromuscular education;Electrical Stimulation;Paraffin;Manual Therapy;Patient/family education    Plan try lower body dressing with AE prn. review spring loaded scissors - maybe try cutting lines    Consulted and Agree with Plan of Care Patient;Family member/caregiver    Family Member Consulted spouse [name]             Patient will benefit from skilled therapeutic intervention in order to improve the following deficits and impairments:   Body Structure / Function / Physical Skills: ADL, IADL, ROM, Strength, Decreased knowledge of use of DME, Dexterity, GMC, Pain, Tone, UE functional use, Body mechanics, Balance, Continence, FMC, Muscle spasms, Skin integrity, Flexibility, Mobility, Sensation, Improper spinal/pelvic alignment, Endurance       Visit Diagnosis: Muscle weakness (generalized)  Other lack of coordination  Quadriplegia, C5-C7 incomplete (HCC)  Other symptoms and signs involving the nervous system    Problem List Patient Active Problem List   Diagnosis Date Noted   Quadriplegia, C5-C7 incomplete ([place]) 01/16/2021   History of spinal fracture 01/16/2021   Suprapubic  catheter ([place]) 01/16/2021   Encounter for routine gynecological examination 09/28/2013   Onychomycosis 09/28/2013   Foot deformity, acquired 03/26/2012   Encounter for preventive health examination 12/25/2010   ROSACEA 08/25/2009   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  [place], [place], [address] Phone: [phone]   Fax:  [phone]  Name: [name] MRN: [id number] Date of Birth: 06-12-1951

## 2021-04-16 NOTE — Therapy (Signed)
[facility] [address] [place], [place], [address] Phone: [phone]   Fax:  [phone]  Physical Therapy Treatment  Patient Details  Name: [name] MRN: [id number] Date of Birth: Mar 26, 1951 Referring Provider (PT): Landis Gandy   Encounter Date: 04/16/2021   PT End of Session - 04/16/21 1020     Visit Number 17    Number of Visits 25    Date for PT Re-Evaluation 05/15/21    Authorization Type [employer] 25 visits 9/8-12/2/22    Authorization - Visit Number 65    Authorization - Number of Visits 25    Progress Note Due on Visit 20    PT Start Time 1018    PT Stop Time 1105    PT Time Calculation (min) 47 min    Equipment Utilized During Treatment Other (comment)   slideboard   Activity Tolerance Patient tolerated treatment well    Behavior During Therapy [facility] for tasks assessed/performed             Past Medical History:  Diagnosis Date   CERVICAL POLYP 03/11/2008   Qualifier: Diagnosis of  By: Regis Bill MD, Standley Brooking    Colon polyps 2005   on colonscopy Dr. Fuller Plan   Fibroid 2004   Per Dr. Ouida Sills   History of shingles    face and mouth   Hx of skin cancer, basal cell    Rosacea    Sciatica of left side 09/28/2013   Scoliosis    noted on mri done for back pain    Past Surgical History:  Procedure Laterality Date   BUNIONECTOMY      There were no vitals filed for this visit.   Subjective Assessment - 04/16/21 1020     Subjective Pt denies any issues. Chair worked better today in the [facility]. Pt reports that she received her manual chair.    Patient is accompained by: Family member   husband, [name]   Pertinent History Pt also takes Toviaz 72m daily. PMH: hyperlipidemia, scoliosis, sleep disorder    Patient Stated Goals Pt would like to be able to walk even if its with assistance. She also wants to be able to type and improve her ability to do ADLs to allow for more  independence.    Currently in Pain? Yes    Pain Score 4     Pain Location Arm    Pain Orientation Right;Left    Pain Descriptors / Indicators Burning    Pain Type Neuropathic pain    Pain Onset More than a month ago    Pain Frequency Constant                               [facility] Adult PT Treatment/Exercise - 04/16/21 1021       Bed Mobility   Bed Mobility Rolling Right;Rolling Left    Rolling Right Supervision/verbal cueing;Contact Guard/Touching assist   PT added leg loops to left leg so that pt could assist left leg up in flexion prior to initiating roll. Pt able to perform transfer supervision after a couple tries with using arms to throw across body rocking for momentum. Performed x 3 reps.   Rolling Left Minimal Assistance - Patient > 75%   only assisted to flex up right leg prior to rolling and hold leg in position. Pt utilized arms for momentum with rocking about 3 times to get over.  Transfers   Transfers Lateral/Scoot Transfers    Lateral/Scoot Transfers 4: Min guard;4: Medical illustrator Details (indicate cue type and reason) Pt able to place slideboard when staring in powerchair supervision with left hand. With return to powerchair from mat needed min assist to place slideboard with right hand and CGA in front for safety. Pt performed downhill transfer supervision and level transfer supervision/CGA. PT only assisted to reposition feet with transfer.      Exercises   Exercises Other Exercises    Other Exercises  PT demonstrated supine passive hamstring stretch and hip IR/ER stretch to pt's new caregiver that was present. Powder board exercises in sidelying: for LLE pt performed hip flexion with resistance in to extension, knee flexion/ext through partial range in flexion; for RLE active assisted ROM the same as other leg with mod assist and muscle tapping to facilitate contraction through partial range.                        PT Short Term Goals - 04/16/21 1906       PT SHORT TERM GOAL #1   Title Pt will be able to perform initial HEP for stretching and strengthening with caregiver.    Baseline pt reports "we are starting to work on them". 04/16/21 Pt has been limited on performance as trying to get caregiver to help her. New one just started and PT started training on stretching today.    Time 4    Period Weeks    Status On-going    Target Date 04/17/21      PT SHORT TERM GOAL #2   Title Pt will be able to perform slideboard transfer on level surface supervision after no more than min assist to help with board placement.    Baseline Pt required max assist for board placement in wheelchair and on mat -  03-19-21. 04/16/21 supervision for transfer other than to readjust feet. Pt able to place board on own with going left and min assist with going right    Time 4    Period Weeks    Status Achieved    Target Date 04/17/21      PT SHORT TERM GOAL #3   Title Pt will be able to perform rolling to each side supervision for improved bed mobility.    Baseline 04/16/21 rolling left supervision using leg loop to help flex LLE up. Min assist to roll left to get RLE in flexed position    Time 4    Period Weeks    Status Partially Met    Target Date 04/17/21      PT SHORT TERM GOAL #4   Title Pt will be able to perform sit to/from supine transfer mod assist for improved bed mobility.    Baseline needs assist to transfer LE's onto mat for sit to supine; able to hook LUE to pull up to sitting from supine to long sitting    Time 4    Period Weeks    Status Achieved    Target Date 03/20/21      PT SHORT TERM GOAL #5   Title Pt will be able to maintain sitting balance edge of mat with only feet supported x 5 min while performing UE movements for improved sitting balance/trunk stability to assist with sitting ADLs.    Baseline met 03-19-21    Time 4    Period Weeks    Status Achieved  Target  Date 03/20/21               PT Long Term Goals - 03/19/21 1925       PT LONG TERM GOAL #1   Title Pt will be able to perform progressive HEP for strengthening, stretching and balance to continue gains on own. (LTGs due 05/15/21)    Time 12    Period Weeks    Status New      PT LONG TERM GOAL #2   Title Pt will be able to perform squat pivot transfer/lateral scoot transfer without slideboard CGA for improved mobility.    Time 12    Period Weeks    Status New      PT LONG TERM GOAL #3   Title Pt will be able to stand at counter x 2 min min assist for improved standing ability.    Time 12    Period Weeks    Status New      PT LONG TERM GOAL #4   Title Pt will be able to perform all bed mobility CGA for improved function.    Time 12    Period Weeks    Status New      PT LONG TERM GOAL #5   Title Pt will report being able to perform 20 minutes of manual w/c propulsion around home for improved UE strength and mobility. (should be getting manual chair soon)    Time 12    Period Weeks    Status New                   Plan - 04/16/21 1911     Clinical Impression Statement PT assessed STGs today. Pt partially met rolling goal as able to perform with rolling right using leg loop on LLE to help flex it up on own. She only needs help with flexion up RLE with rolling left. Pt did well with slideboard transfer today with less assist to place slideboard today meeting goal. Pt continues to benefit from therapy to further progress towards remaining goals.    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[employer] officer Evolving/Moderate complexity    Rehab Potential Good    PT Frequency 2x / week   plus eval   PT  Duration 12 weeks    PT Treatment/Interventions ADLs/Self Care Home Management;Electrical Stimulation;DME Instruction;Neuromuscular re-education;Manual techniques;Therapeutic exercise;Balance training;Therapeutic activities;Cryotherapy;Moist Heat;Functional mobility training;Stair training;Gait training;Patient/family education;Orthotic Fit/Training;Wheelchair mobility training;Dry needling;Passive range of motion;Vestibular    PT Next Visit Plan Continue SciFit if pt comes in her manual chair? If brings manual chair work on w/c mobility? Slideboard transfer training working towards more level/uphill transfer, bed mobility. Continue to work on functional strengthening for UE, prone position for scapular strengthening. Short sit balance activities to try to engage core some as well as balance with maintaining long sit.  At some point may try bioness to try to get more quad activation with exercises and possibly with standing frame. Pt does have standing frame at home - have encouraged her to use more daily    Consulted and Agree with Plan of Care Patient;Family member/caregiver    Family Member Consulted husband, [name]             Patient will benefit from skilled therapeutic intervention in order to improve the following deficits and  impairments:  Decreased balance, Decreased mobility, Decreased strength, Impaired sensation, Postural dysfunction, Impaired flexibility, Impaired UE functional use, Impaired tone, Decreased range of motion  Visit Diagnosis: Muscle weakness (generalized)  Quadriplegia, C5-C7 incomplete ([facility])     Problem List Patient Active Problem List   Diagnosis Date Noted   Quadriplegia, C5-C7 incomplete ([place]) 01/16/2021   History of spinal fracture 01/16/2021   Suprapubic catheter ([place]) 01/16/2021   Encounter for routine gynecological examination 09/28/2013   Onychomycosis 09/28/2013   Foot deformity, acquired 03/26/2012   Encounter for preventive health examination  12/25/2010   ROSACEA 08/25/2009   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 [name], [place], [address] Phone: [phone]   Fax:  [phone]  Name: [name] MRN: [id number] Date of Birth: Mar 21, 1951

## 2021-04-21 ENCOUNTER — Ambulatory Visit: Payer: Medicare PPO

## 2021-04-21 ENCOUNTER — Ambulatory Visit: Payer: Medicare PPO | Admitting: Occupational Therapy

## 2021-04-21 ENCOUNTER — Other Ambulatory Visit: Payer: Self-pay

## 2021-04-21 ENCOUNTER — Encounter: Payer: Self-pay | Admitting: Occupational Therapy

## 2021-04-21 DIAGNOSIS — R208 Other disturbances of skin sensation: Secondary | ICD-10-CM

## 2021-04-21 DIAGNOSIS — R278 Other lack of coordination: Secondary | ICD-10-CM

## 2021-04-21 DIAGNOSIS — M6281 Muscle weakness (generalized): Secondary | ICD-10-CM | POA: Diagnosis not present

## 2021-04-21 DIAGNOSIS — G8254 Quadriplegia, C5-C7 incomplete: Secondary | ICD-10-CM | POA: Diagnosis not present

## 2021-04-21 DIAGNOSIS — G8253 Quadriplegia, C5-C7 complete: Secondary | ICD-10-CM | POA: Diagnosis not present

## 2021-04-21 DIAGNOSIS — R2689 Other abnormalities of gait and mobility: Secondary | ICD-10-CM | POA: Diagnosis not present

## 2021-04-21 DIAGNOSIS — R293 Abnormal posture: Secondary | ICD-10-CM | POA: Diagnosis not present

## 2021-04-21 DIAGNOSIS — R29818 Other symptoms and signs involving the nervous system: Secondary | ICD-10-CM

## 2021-04-21 NOTE — Therapy (Signed)
[facility] [address] [place], [place], [address] Phone: [phone]   Fax:  [phone]  Occupational Therapy Treatment  Patient Details  Name: [name] MRN: [id number] Date of Birth: 02-10-1951 Referring Provider (OT): Ina Homes, MD   Encounter Date: 04/21/2021   OT End of Session - 04/21/21 1242     Visit Number 11    Number of Visits 25    Date for OT Re-Evaluation 06/05/21    Authorization Type [employer]    Authorization Time Period Auth Req'd - 16 visits for OT 03/13/21 - 06/08/21    Authorization - Visit Number 11    Authorization - Number of Visits 16    Progress Note Due on Visit 61    OT Start Time 1241   late coming from PT   OT Stop Time 1315    OT Time Calculation (min) 34 min    Activity Tolerance Patient tolerated treatment well    Behavior During Therapy [facility] for tasks assessed/performed             Past Medical History:  Diagnosis Date   CERVICAL POLYP 03/11/2008   Qualifier: Diagnosis of  By: Regis Bill MD, Standley Brooking    Colon polyps 2005   on colonscopy Dr. Fuller Plan   Fibroid 2004   Per Dr. Ouida Sills   History of shingles    face and mouth   Hx of skin cancer, basal cell    Rosacea    Sciatica of left side 09/28/2013   Scoliosis    noted on mri done for back pain    Past Surgical History:  Procedure Laterality Date   BUNIONECTOMY      There were no vitals filed for this visit.   Subjective Assessment - 04/21/21 1242     Subjective  [place]    Pertinent History hyperlipidemia, scoliosis, sleep disorder    Patient Stated Goals regain arm strength for rolling over in bed and fine motor coordination for increasing typing    Currently in Pain? Yes    Pain Score 4     Pain Location Arm    Pain Orientation Right;Left    Pain Descriptors / Indicators Burning    Pain Type Neuropathic pain    Pain Onset More than a month ago    Pain  Frequency Constant              Grooming reports doing more with RUE and is able to complete doing her hair completely independent.  Reach/Coordination with RUE with picking up 1 inch blocks and placing into elevated surface of approx 12" with fatigue at end of exercises. Pt worked on coordination with flat stackable items with min/mod difficulty.   Scissors/Cutting working with spring loop scissors (red) with LUE and cutting lines on [employer]. Pt with better success with spring load culinary scissors with using RUE and LUE and troubleshooting what works best. Corporate investment banker are right handed so used better with RUE with thumb velcro for securement and use of LUE for using some strength                      OT Short Term Goals - 04/16/21 1116       OT SHORT TERM GOAL #1   Title Pt will be independent with HEP w CG assistance PRN    Time 4    Period Weeks    Status On-going  Target Date 04/13/21      OT SHORT TERM GOAL #2   Title Pt will verbalize understanding and report independence with CG assistance with ues of modalities at home with good safety.    Baseline has paraffin and Estim unit    Time 4    Period Weeks    Status Achieved   pt has new caregiver she is training for paraffin - reviewed and demonstrated understanding of estim unit     OT SHORT TERM GOAL #3   Title Pt will increase Box and Blocks score with RUE to 30 blocks or greater    Baseline R 26 L 39    Time 4    Period Weeks    Status On-going   R 22 blocks  04/16/21     OT SHORT TERM GOAL #4   Title Pt will increase BUE tricep strength to 4/5 consistently for increasing ability to perform SB transfers with supervision/set up assistance only.    Baseline 3+/5 strength BUE    Time 4    Period Weeks    Status Achieved      OT SHORT TERM GOAL #5   Title Pt will increase coordination in LUE to completing 9 hole peg test in 70 seconds or less.    Baseline L 75.13s, R unable    Time 4     Period Weeks    Status Achieved   L 63.87s 04/16/21     OT SHORT TERM GOAL #6   Title Pt will verbalize understanding of adapted strategies and/or equipment for increasing indepenence with ADLs and IADLs (typing, bathing, cutting food, etc)    Baseline has U cuff    Time 4    Period Weeks    Status On-going   has verbalized understanding of a lot of AE but continues to benefit from education              OT Long Term Goals - 03/16/21 0943       OT LONG TERM GOAL #1   Title Pt will be independence with any updated HEP    Time 12    Period Weeks    Status New    Target Date 06/08/21      OT LONG TERM GOAL #2   Title Pt will increase functional use of BUE evidenced by completing Box and Blocks with score of 35 or greater with RUE, 45 or greater with LUE.    Baseline R 26, L 39    Time 12    Period Weeks    Status New      OT LONG TERM GOAL #3   Title PT will improve grip strength in BUE by increasing grip strength to 10 lbs or greater with RUE and 20 lbs or greater with LUE.    Baseline R 1.5, L 12.3    Time 12    Period Weeks    Status New      OT LONG TERM GOAL #4   Title Pt will improve isolated finger movements in order to increase skill towards simple typing with adapted strategies and equipment PRN.    Baseline isolated movement in LUE    Time 12    Period Weeks    Status New      OT LONG TERM GOAL #5   Title Pt will improve 9 hole peg test in LUE to completing in 65 seconds or less in order to increase functional use and demonstrate ability to  place 2 or more pegs with RUE.    Baseline L 75.13s, R unable    Time 12    Period Weeks    Status New      OT LONG TERM GOAL #6   Title Pt will report completing UB and LB dressing with decreased assistance consistently.    Baseline UB (reports able to do but not consistently doing), LB total A    Time 12    Period Weeks    Status New                   Plan - 04/21/21 1427     Clinical Impression  Statement Pt making progress with scissor skills and troubleshooting what works best. Pt is demonstrating improved RUE function and increased strength and independence with ADLs.    OT Occupational Profile and History Detailed Assessment- Review of Records and additional review of physical, cognitive, psychosocial history related to current functional performance    Occupational performance deficits (Please refer to evaluation for details): ADL's;IADL's;Leisure;Work;Rest and Sleep    Body Structure / Function / Physical Skills ADL;IADL;ROM;Strength;Decreased knowledge of use of DME;Dexterity;GMC;Pain;Tone;UE functional use;Body mechanics;Balance;Continence;FMC;Muscle spasms;Skin integrity;Flexibility;Mobility;Sensation;Improper spinal/pelvic alignment;Endurance    Rehab Potential Good    Clinical Decision Making Several treatment options, min-mod task modification necessary    Comorbidities Affecting Occupational Performance: May have comorbidities impacting occupational performance    Modification or Assistance to Complete Evaluation  Min-Moderate modification of tasks or assist with assess necessary to complete eval    OT Frequency 2x / week    OT Duration 12 weeks    OT Treatment/Interventions Self-care/ADL training;Moist Heat;Fluidtherapy;DME and/or AE instruction;Splinting;Therapeutic activities;Aquatic Therapy;Ultrasound;Therapeutic exercise;Cognitive remediation/compensation;Passive range of motion;Functional Mobility Training;Neuromuscular education;Electrical Stimulation;Paraffin;Manual Therapy;Patient/family education    Plan try lower body dressing with AE prn. Continue to work towards increased coordination and strength BUE    Consulted and Agree with Plan of Care Patient;Family member/caregiver    Family Member Consulted spouse [name]             Patient will benefit from skilled therapeutic intervention in order to improve the following deficits and impairments:   Body Structure /  Function / Physical Skills: ADL, IADL, ROM, Strength, Decreased knowledge of use of DME, Dexterity, GMC, Pain, Tone, UE functional use, Body mechanics, Balance, Continence, FMC, Muscle spasms, Skin integrity, Flexibility, Mobility, Sensation, Improper spinal/pelvic alignment, Endurance       Visit Diagnosis: Muscle weakness (generalized)  Quadriplegia, C5-C7 incomplete (HCC)  Other lack of coordination  Other symptoms and signs involving the nervous system  Other disturbances of skin sensation    Problem List Patient Active Problem List   Diagnosis Date Noted   Quadriplegia, C5-C7 incomplete ([place]) 01/16/2021   History of spinal fracture 01/16/2021   Suprapubic catheter ([place]) 01/16/2021   Encounter for routine gynecological examination 09/28/2013   Onychomycosis 09/28/2013   Foot deformity, acquired 03/26/2012   Encounter for preventive health examination 12/25/2010   ROSACEA 08/25/2009   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 [place], [place], [address] Phone: [phone]   Fax:  [phone]  Name: [name] MRN: [id number] Date of Birth: 1950/11/21

## 2021-04-21 NOTE — Therapy (Signed)
[facility] [address] [place], [place], [address] Phone: [phone]   Fax:  [phone]  Physical Therapy Treatment  Patient Details  Name: [name] MRN: [id number] Date of Birth: 04/02/1951 Referring Provider (PT): Landis Gandy   Encounter Date: 04/21/2021   PT End of Session - 04/21/21 1153     Visit Number 18    Number of Visits 25    Date for PT Re-Evaluation 05/15/21    Authorization Type [employer] 25 visits 9/8-12/2/22    Authorization - Visit Number 18    Authorization - Number of Visits 25    Progress Note Due on Visit 20    PT Start Time 1151    PT Stop Time 1236    PT Time Calculation (min) 45 min    Equipment Utilized During Treatment Other (comment)   slideboard   Activity Tolerance Patient tolerated treatment well    Behavior During Therapy [facility] for tasks assessed/performed             Past Medical History:  Diagnosis Date   CERVICAL POLYP 03/11/2008   Qualifier: Diagnosis of  By: Regis Bill MD, Standley Brooking    Colon polyps 2005   on colonscopy Dr. Fuller Plan   Fibroid 2004   Per Dr. Ouida Sills   History of shingles    face and mouth   Hx of skin cancer, basal cell    Rosacea    Sciatica of left side 09/28/2013   Scoliosis    noted on mri done for back pain    Past Surgical History:  Procedure Laterality Date   BUNIONECTOMY      There were no vitals filed for this visit.   Subjective Assessment - 04/21/21 1153     Subjective Pt reports she is doing well. She sees the physiatrist 11/22.    Patient is accompained by: Family member   husband, [name]   Pertinent History Pt also takes Toviaz 41m daily. PMH: hyperlipidemia, scoliosis, sleep disorder    Patient Stated Goals Pt would like to be able to walk even if its with assistance. She also wants to be able to type and improve her ability to do ADLs to allow for more independence.    Currently in Pain? Yes    Pain Score 4      Pain Location Arm    Pain Orientation Right;Left    Pain Descriptors / Indicators Burning    Pain Type Neuropathic pain    Pain Onset More than a month ago    Pain Frequency Constant                               [facility] Adult PT Treatment/Exercise - 04/21/21 1155       Transfers   Transfers Lateral/Scoot Transfers    Lateral/Scoot Transfers 4: Min guard;4: Min aPassenger transport managerDetails (indicate cue type and reason) Pt able to place slideboard when staring in powerchair supervision with left hand. With return to powerchair from mat needed min assist to place slideboard with right hand and CGA in front for safety. Pt performed slightly uphill transfer CGA and level transfer supervision. PT only assisted to reposition feet with transfer when pt requested. Worked on pt starting to assist more to adjust left foot prior to starting transfer.      Therapeutic Activites    Therapeutic Activities Other Therapeutic Activities  Other Therapeutic Activities Pt performed rolling right with left leg loop strap donned. Pt able to flex left hip in to flexion utilizing leg loop. PT had to stabilize foot once up as shoe came off. Pt rolled using momentum from arms after 4 rocking attempts. From sidelying performed walking up on arms in C to come to long sit. Pt needed min/mod assist to get right elbow under her to begin and then mostly CGA to stabilize at right shoulder. Instructed to keep weight forward and once was near waist with arms had pt reach over to left leg with left arm to help pull herself up and forward with pushing up on right arm min assist. Reversed the process coming down part way and then back up 2 more times needing min assist to push up with right arm.      Exercises   Exercises Other Exercises    Other Exercises  PT performed gentle hamstring stretch in long sit 30 sec x 2. In long sit leaning back on arms performed modified tricep dips x 10. Then  added therastones under hands placed next to bottom to work on trying to lift bottom up some from mat x 10 with PT and student stabilizing hands on therastones and cuing pt to keep weight forward. Trialed pushing and scooting to right with cues to twist  head to left to try to get push. Pt unable to clear bottom and needed mod assist to move short distance.                       PT Short Term Goals - 04/16/21 1906       PT SHORT TERM GOAL #1   Title Pt will be able to perform initial HEP for stretching and strengthening with caregiver.    Baseline pt reports "we are starting to work on them". 04/16/21 Pt has been limited on performance as trying to get caregiver to help her. New one just started and PT started training on stretching today.    Time 4    Period Weeks    Status On-going    Target Date 04/17/21      PT SHORT TERM GOAL #2   Title Pt will be able to perform slideboard transfer on level surface supervision after no more than min assist to help with board placement.    Baseline Pt required max assist for board placement in wheelchair and on mat -  03-19-21. 04/16/21 supervision for transfer other than to readjust feet. Pt able to place board on own with going left and min assist with going right    Time 4    Period Weeks    Status Achieved    Target Date 04/17/21      PT SHORT TERM GOAL #3   Title Pt will be able to perform rolling to each side supervision for improved bed mobility.    Baseline 04/16/21 rolling left supervision using leg loop to help flex LLE up. Min assist to roll left to get RLE in flexed position    Time 4    Period Weeks    Status Partially Met    Target Date 04/17/21      PT SHORT TERM GOAL #4   Title Pt will be able to perform sit to/from supine transfer mod assist for improved bed mobility.    Baseline needs assist to transfer LE's onto mat for sit to supine; able to hook LUE to pull up to  sitting from supine to long sitting    Time 4     Period Weeks    Status Achieved    Target Date 03/20/21      PT SHORT TERM GOAL #5   Title Pt will be able to maintain sitting balance edge of mat with only feet supported x 5 min while performing UE movements for improved sitting balance/trunk stability to assist with sitting ADLs.    Baseline met 03-19-21    Time 4    Period Weeks    Status Achieved    Target Date 03/20/21               PT Long Term Goals - 03/19/21 1925       PT LONG TERM GOAL #1   Title Pt will be able to perform progressive HEP for strengthening, stretching and balance to continue gains on own. (LTGs due 05/15/21)    Time 12    Period Weeks    Status New      PT LONG TERM GOAL #2   Title Pt will be able to perform squat pivot transfer/lateral scoot transfer without slideboard CGA for improved mobility.    Time 12    Period Weeks    Status New      PT LONG TERM GOAL #3   Title Pt will be able to stand at counter x 2 min min assist for improved standing ability.    Time 12    Period Weeks    Status New      PT LONG TERM GOAL #4   Title Pt will be able to perform all bed mobility CGA for improved function.    Time 12    Period Weeks    Status New      PT LONG TERM GOAL #5   Title Pt will report being able to perform 20 minutes of manual w/c propulsion around home for improved UE strength and mobility. (should be getting manual chair soon)    Time 12    Period Weeks    Status New                   Plan - 04/21/21 1834     Clinical Impression Statement Pt continued to work on improving bed mobility and functional strengthening. Pt requiring less assistance with using "C" walk to come sidelying to long sit.    Personal Factors and Comorbidities Comorbidity 2    Comorbidities scoliosis and sleep disorder    Examination-Activity Limitations Bed Mobility;Locomotion Level;Transfers;Stand;Bathing;Dressing    Examination-Participation Freight forwarder;Yard  Work    Merchant navy officer Evolving/Moderate complexity    Rehab Potential Good    PT Frequency 2x / week   plus eval   PT Duration 12 weeks    PT Treatment/Interventions ADLs/Self Care Home Management;Electrical Stimulation;DME Instruction;Neuromuscular re-education;Manual techniques;Therapeutic exercise;Balance training;Therapeutic activities;Cryotherapy;Moist Heat;Functional mobility training;Stair training;Gait training;Patient/family education;Orthotic Fit/Training;Wheelchair mobility training;Dry needling;Passive range of motion;Vestibular    PT Next Visit Plan Continue SciFit if pt comes in her manual chair? If brings manual chair work on w/c mobility? Slideboard transfer training working towards more level/uphill transfer, bed mobility. Continue to work on functional strengthening for UE, prone position for scapular strengthening. Short sit balance activities to try to engage core some as well as balance with maintaining long sit.  At some point may try bioness to try to get more quad activation with exercises and possibly with standing frame. Pt does have standing frame at home -  have encouraged her to use more daily    Consulted and Agree with Plan of Care Patient;Family member/caregiver    Family Member Consulted husband, [name]             Patient will benefit from skilled therapeutic intervention in order to improve the following deficits and impairments:  Decreased balance, Decreased mobility, Decreased strength, Impaired sensation, Postural dysfunction, Impaired flexibility, Impaired UE functional use, Impaired tone, Decreased range of motion  Visit Diagnosis: Muscle weakness (generalized)  Quadriplegia, C5-C7 incomplete ([place])     Problem List Patient Active Problem List   Diagnosis Date Noted   Quadriplegia, C5-C7 incomplete ([place]) 01/16/2021   History of spinal fracture 01/16/2021   Suprapubic catheter ([place]) 01/16/2021   Encounter for routine  gynecological examination 09/28/2013   Onychomycosis 09/28/2013   Foot deformity, acquired 03/26/2012   Encounter for preventive health examination 12/25/2010   ROSACEA 08/25/2009   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 [place], [place], [address] Phone: [phone]   Fax:  [phone]  Name: [name] MRN: [id number] Date of Birth: 1950-08-17

## 2021-04-23 ENCOUNTER — Encounter: Payer: Self-pay | Admitting: Occupational Therapy

## 2021-04-23 ENCOUNTER — Ambulatory Visit: Payer: Medicare PPO

## 2021-04-23 ENCOUNTER — Other Ambulatory Visit: Payer: Self-pay

## 2021-04-23 ENCOUNTER — Ambulatory Visit: Payer: Medicare PPO | Admitting: Occupational Therapy

## 2021-04-23 DIAGNOSIS — R278 Other lack of coordination: Secondary | ICD-10-CM | POA: Diagnosis not present

## 2021-04-23 DIAGNOSIS — M6281 Muscle weakness (generalized): Secondary | ICD-10-CM

## 2021-04-23 DIAGNOSIS — R29818 Other symptoms and signs involving the nervous system: Secondary | ICD-10-CM

## 2021-04-23 DIAGNOSIS — G8254 Quadriplegia, C5-C7 incomplete: Secondary | ICD-10-CM

## 2021-04-23 DIAGNOSIS — R2689 Other abnormalities of gait and mobility: Secondary | ICD-10-CM | POA: Diagnosis not present

## 2021-04-23 DIAGNOSIS — G8253 Quadriplegia, C5-C7 complete: Secondary | ICD-10-CM | POA: Diagnosis not present

## 2021-04-23 DIAGNOSIS — R208 Other disturbances of skin sensation: Secondary | ICD-10-CM

## 2021-04-23 DIAGNOSIS — R293 Abnormal posture: Secondary | ICD-10-CM | POA: Diagnosis not present

## 2021-04-23 NOTE — Patient Instructions (Signed)
Access Code: [facility] URL: [email or web address] Date: 04/23/2021 Prepared by: Waldo Laine  Exercises Putty Squeezes - 1 x daily - 7 x weekly - 3 sets - 10 reps Rolling Putty on Table - 1 x daily - 7 x weekly - 3 sets - 10 reps Thumb Opposition with Putty - 1 x daily - 7 x weekly - 3 sets - 10 reps Seated Finger MP Flexion with Putty - 1 x daily - 7 x weekly - 3 sets - 10 reps

## 2021-04-23 NOTE — Therapy (Signed)
[facility] [address] [place], [place], [address] Phone: [phone]   Fax:  [phone]  Physical Therapy Treatment  Patient Details  Name: [name] MRN: [id number] Date of Birth: 08/09/1950 Referring Provider (PT): Landis Gandy   Encounter Date: 04/23/2021   PT End of Session - 04/23/21 1020     Visit Number 19    Number of Visits 25    Date for PT Re-Evaluation 05/15/21    Authorization Type [employer] 25 visits 9/8-12/2/22    Authorization - Visit Number 55    Authorization - Number of Visits 25    Progress Note Due on Visit 20    PT Start Time 1016    PT Stop Time 1105    PT Time Calculation (min) 49 min    Equipment Utilized During Treatment Other (comment)   slideboard   Activity Tolerance Patient tolerated treatment well    Behavior During Therapy [facility] for tasks assessed/performed             Past Medical History:  Diagnosis Date   CERVICAL POLYP 03/11/2008   Qualifier: Diagnosis of  By: Regis Bill MD, Standley Brooking    Colon polyps 2005   on colonscopy Dr. Fuller Plan   Fibroid 2004   Per Dr. Ouida Sills   History of shingles    face and mouth   Hx of skin cancer, basal cell    Rosacea    Sciatica of left side 09/28/2013   Scoliosis    noted on mri done for back pain    Past Surgical History:  Procedure Laterality Date   BUNIONECTOMY      There were no vitals filed for this visit.   Subjective Assessment - 04/23/21 1021     Subjective Pt reports she is doing well. Really likes new caregiver.    Patient is accompained by: Family member   husband, [name]   Pertinent History Pt also takes Toviaz 70m daily. PMH: hyperlipidemia, scoliosis, sleep disorder    Patient Stated Goals Pt would like to be able to walk even if its with assistance. She also wants to be able to type and improve her ability to do ADLs to allow for more independence.    Currently in Pain? Yes    Pain Score 3      Pain Location Arm    Pain Orientation Right;Left    Pain Descriptors / Indicators Burning    Pain Type Neuropathic pain    Pain Onset More than a month ago    Pain Frequency Constant                               [facility] Adult PT Treatment/Exercise - 04/23/21 1021       Bed Mobility   Bed Mobility Rolling Right    Rolling Right Contact Guard/Touching assist;Minimal Assistance - Patient > 75%   Pt utilized leg loop on left leg to help slide left leg up enough prior to rolling. Needed min assist initially to get fully over. Pt was cued to really reach across with left arm/shoulder and use momentum with rocking more than 3 times if necessary.     Transfers   Transfers Lateral/Scoot Transfers    Lateral/Scoot Transfers 4: Min guard;4: MMedical illustratorDetails (indicate cue type and reason) Pt able to place slideboard when starting in powerchair supervision with left hand. With return  to powerchair from mat needed min assist to place slideboard with right hand and CGA in front for safety. Pt performed slightly uphill transfer CGA and level transfer supervision. PT only assisted to reposition feet with transfer when pt requested. Worked on pt starting to assist more to adjust left foot prior to starting transfer.      Therapeutic Activites    Therapeutic Activities Other Therapeutic Activities    Other Therapeutic Activities From right sidelying pt needed mod assist to get up on right elbow then min assist to walk that arm forward and around in "C" position. Once near waist had pt worked on trying to straighten left leg back out needed min assist just to remove shoe as was sticking on mat. Then pt reached with LUE for leg to help pull up to long sit. Verbal cues to keep weight forward and CGA at right arm when pushing. From long sit had pt come down on forearm x 3 each side and reach across and forward to touch cone with the other hand to encourage keeping  weight forward. Pt able to rise back up from right side on own grabbing leg with left hand. When came down on left side needed min assist to come back up. Worked on positioning with left arm and trying to get pt to rock more and use head keeping weight forward to come back up. Decreased assist towards the end. In long sit worked on scooting bottom to side with hands on yoga blocks and using momentum with head to try to rotate some max assist under bottom. In long sit pt performed scooting to edge of mat moving legs with min assist on right to lift up and unweight some with pt pushing with arms. Pt able to move left leg with assist of leg loop.      Neuro Re-ed    Neuro Re-ed Details  In long sit: maintaining position without UE support holding purple ball between hands and raising up to shoulder height x10 with cues to try to keep head up for more erect posture. Repeated with performing trunk rotation with ball x 10.      Exercises   Exercises Other Exercises    Other Exercises  Pt performed self hamstring stretch in long sit 1 min x 2.                       PT Short Term Goals - 04/16/21 1906       PT SHORT TERM GOAL #1   Title Pt will be able to perform initial HEP for stretching and strengthening with caregiver.    Baseline pt reports "we are starting to work on them". 04/16/21 Pt has been limited on performance as trying to get caregiver to help her. New one just started and PT started training on stretching today.    Time 4    Period Weeks    Status On-going    Target Date 04/17/21      PT SHORT TERM GOAL #2   Title Pt will be able to perform slideboard transfer on level surface supervision after no more than min assist to help with board placement.    Baseline Pt required max assist for board placement in wheelchair and on mat -  03-19-21. 04/16/21 supervision for transfer other than to readjust feet. Pt able to place board on own with going left and min assist with going  right    Time 4  Period Weeks    Status Achieved    Target Date 04/17/21      PT SHORT TERM GOAL #3   Title Pt will be able to perform rolling to each side supervision for improved bed mobility.    Baseline 04/16/21 rolling left supervision using leg loop to help flex LLE up. Min assist to roll left to get RLE in flexed position    Time 4    Period Weeks    Status Partially Met    Target Date 04/17/21      PT SHORT TERM GOAL #4   Title Pt will be able to perform sit to/from supine transfer mod assist for improved bed mobility.    Baseline needs assist to transfer LE's onto mat for sit to supine; able to hook LUE to pull up to sitting from supine to long sitting    Time 4    Period Weeks    Status Achieved    Target Date 03/20/21      PT SHORT TERM GOAL #5   Title Pt will be able to maintain sitting balance edge of mat with only feet supported x 5 min while performing UE movements for improved sitting balance/trunk stability to assist with sitting ADLs.    Baseline met 03-19-21    Time 4    Period Weeks    Status Achieved    Target Date 03/20/21               PT Long Term Goals - 03/19/21 1925       PT LONG TERM GOAL #1   Title Pt will be able to perform progressive HEP for strengthening, stretching and balance to continue gains on own. (LTGs due 05/15/21)    Time 12    Period Weeks    Status New      PT LONG TERM GOAL #2   Title Pt will be able to perform squat pivot transfer/lateral scoot transfer without slideboard CGA for improved mobility.    Time 12    Period Weeks    Status New      PT LONG TERM GOAL #3   Title Pt will be able to stand at counter x 2 min min assist for improved standing ability.    Time 12    Period Weeks    Status New      PT LONG TERM GOAL #4   Title Pt will be able to perform all bed mobility CGA for improved function.    Time 12    Period Weeks    Status New      PT LONG TERM GOAL #5   Title Pt will report being able to  perform 20 minutes of manual w/c propulsion around home for improved UE strength and mobility. (should be getting manual chair soon)    Time 12    Period Weeks    Status New                   Plan - 04/23/21 1127     Clinical Impression Statement Pt demonstrated improved sitting balance in long sit today with being able to add in dynamic task  with no UE support. She was also able to move legs to scoot on mat using arms with less assistance.    Personal Factors and Comorbidities Comorbidity 2    Comorbidities scoliosis and sleep disorder    Examination-Activity Limitations Bed Mobility;Locomotion Level;Transfers;Stand;Bathing;Dressing    Examination-Participation Freight forwarder;Valla Leaver Work  Stability/Clinical Decision Making Evolving/Moderate complexity    Rehab Potential Good    PT Frequency 2x / week   plus eval   PT Duration 12 weeks    PT Treatment/Interventions ADLs/Self Care Home Management;Electrical Stimulation;DME Instruction;Neuromuscular re-education;Manual techniques;Therapeutic exercise;Balance training;Therapeutic activities;Cryotherapy;Moist Heat;Functional mobility training;Stair training;Gait training;Patient/family education;Orthotic Fit/Training;Wheelchair mobility training;Dry needling;Passive range of motion;Vestibular    PT Next Visit Plan Continue SciFit if pt comes in her manual chair? If brings manual chair work on w/c mobility? Slideboard transfer training working towards more level/uphill transfer, bed mobility. Continue to work on functional strengthening for UE, prone position for scapular strengthening. Continue work on long sit with moving legs on mat, balance with less UE support. Tricep dips using yoga blocks to try to get more lift. Utilizing "C" walk to come from sidelying to long sit.  At some point may try bioness to try to get more quad activation with exercises and possibly with standing frame. Pt does have standing  frame at home - have encouraged her to use more daily    Consulted and Agree with Plan of Care Patient;Family member/caregiver    Family Member Consulted husband, [name]             Patient will benefit from skilled therapeutic intervention in order to improve the following deficits and impairments:  Decreased balance, Decreased mobility, Decreased strength, Impaired sensation, Postural dysfunction, Impaired flexibility, Impaired UE functional use, Impaired tone, Decreased range of motion  Visit Diagnosis: Muscle weakness (generalized)  Quadriplegia, C5-C7 incomplete ([place])     Problem List Patient Active Problem List   Diagnosis Date Noted   Quadriplegia, C5-C7 incomplete ([place]) 01/16/2021   History of spinal fracture 01/16/2021   Suprapubic catheter ([place]) 01/16/2021   Encounter for routine gynecological examination 09/28/2013   Onychomycosis 09/28/2013   Foot deformity, acquired 03/26/2012   Encounter for preventive health examination 12/25/2010   ROSACEA 08/25/2009   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 [place], [place], [address] Phone: [phone]   Fax:  [phone]  Name: [name] MRN: [id number] Date of Birth: 17-Jul-1950

## 2021-04-23 NOTE — Therapy (Signed)
[facility] [address] [place], [place], [address] Phone: [phone]   Fax:  [phone]  Occupational Therapy Treatment  Patient Details  Name: [name] MRN: [id number] Date of Birth: 1950-09-21 Referring Provider (OT): Ina Homes, MD   Encounter Date: 04/23/2021   OT End of Session - 04/23/21 1110     Visit Number 12    Number of Visits 25    Date for OT Re-Evaluation 06/05/21    Authorization Type [employer]    Authorization Time Period Auth Req'd - 16 visits for OT 03/13/21 - 06/08/21    Authorization - Visit Number 12    Authorization - Number of Visits 16    Progress Note Due on Visit 20    OT Start Time 1107    OT Stop Time 1145    OT Time Calculation (min) 38 min    Activity Tolerance Patient tolerated treatment well    Behavior During Therapy [facility] for tasks assessed/performed             Past Medical History:  Diagnosis Date   CERVICAL POLYP 03/11/2008   Qualifier: Diagnosis of  By: Regis Bill MD, Standley Brooking    Colon polyps 2005   on colonscopy Dr. Fuller Plan   Fibroid 2004   Per Dr. Ouida Sills   History of shingles    face and mouth   Hx of skin cancer, basal cell    Rosacea    Sciatica of left side 09/28/2013   Scoliosis    noted on mri done for back pain    Past Surgical History:  Procedure Laterality Date   BUNIONECTOMY      There were no vitals filed for this visit.   Subjective Assessment - 04/23/21 1109     Subjective  I got very disheveled in PT    Pertinent History hyperlipidemia, scoliosis, sleep disorder    Patient Stated Goals regain arm strength for rolling over in bed and fine motor coordination for increasing typing    Currently in Pain? Yes    Pain Score 3     Pain Location Arm    Pain Orientation Right;Left    Pain Descriptors / Indicators Burning    Pain Type Neuropathic pain    Pain Onset More than a month ago    Pain Frequency Constant                   Yellow theraputty with LUE for increased grip strength - trialed with RUE with min grip - continue to work.  Opening different containers - pt was able to open several containers of various sizes but with pronated grasp with RUE on bottle. Pt used LUE to twist open and close containers. Pt used shelf liner for stabilizing containers on table with RUE with no "spills"  Digiflex 3.0 lb  [employer] with LUE and with RUE for placing tees. Pt used LUE for placing marbles on tees with min drops.               OT Education - 04/23/21 1125     Education Details Yellow Theraputty LUE Access Code: [email or web address]    Person(s) Educated Patient;Caregiver(s)    Methods Explanation;Demonstration;Handout    Comprehension Verbalized understanding;Returned demonstration              OT Short Term Goals - 04/16/21 1116       OT SHORT TERM GOAL #1   Title Pt will  be independent with HEP w CG assistance PRN    Time 4    Period Weeks    Status On-going    Target Date 04/13/21      OT SHORT TERM GOAL #2   Title Pt will verbalize understanding and report independence with CG assistance with ues of modalities at home with good safety.    Baseline has paraffin and Estim unit    Time 4    Period Weeks    Status Achieved   pt has new caregiver she is training for paraffin - reviewed and demonstrated understanding of estim unit     OT SHORT TERM GOAL #3   Title Pt will increase Box and Blocks score with RUE to 30 blocks or greater    Baseline R 26 L 39    Time 4    Period Weeks    Status On-going   R 22 blocks  04/16/21     OT SHORT TERM GOAL #4   Title Pt will increase BUE tricep strength to 4/5 consistently for increasing ability to perform SB transfers with supervision/set up assistance only.    Baseline 3+/5 strength BUE    Time 4    Period Weeks    Status Achieved      OT SHORT TERM GOAL #5   Title Pt will increase coordination in LUE to  completing 9 hole peg test in 70 seconds or less.    Baseline L 75.13s, R unable    Time 4    Period Weeks    Status Achieved   L 63.87s 04/16/21     OT SHORT TERM GOAL #6   Title Pt will verbalize understanding of adapted strategies and/or equipment for increasing indepenence with ADLs and IADLs (typing, bathing, cutting food, etc)    Baseline has U cuff    Time 4    Period Weeks    Status On-going   has verbalized understanding of a lot of AE but continues to benefit from education              OT Long Term Goals - 03/16/21 0943       OT LONG TERM GOAL #1   Title Pt will be independence with any updated HEP    Time 12    Period Weeks    Status New    Target Date 06/08/21      OT LONG TERM GOAL #2   Title Pt will increase functional use of BUE evidenced by completing Box and Blocks with score of 35 or greater with RUE, 45 or greater with LUE.    Baseline R 26, L 39    Time 12    Period Weeks    Status New      OT LONG TERM GOAL #3   Title PT will improve grip strength in BUE by increasing grip strength to 10 lbs or greater with RUE and 20 lbs or greater with LUE.    Baseline R 1.5, L 12.3    Time 12    Period Weeks    Status New      OT LONG TERM GOAL #4   Title Pt will improve isolated finger movements in order to increase skill towards simple typing with adapted strategies and equipment PRN.    Baseline isolated movement in LUE    Time 12    Period Weeks    Status New      OT LONG TERM GOAL #5   Title  Pt will improve 9 hole peg test in LUE to completing in 65 seconds or less in order to increase functional use and demonstrate ability to place 2 or more pegs with RUE.    Baseline L 75.13s, R unable    Time 12    Period Weeks    Status New      OT LONG TERM GOAL #6   Title Pt will report completing UB and LB dressing with decreased assistance consistently.    Baseline UB (reports able to do but not consistently doing), LB total A    Time 12    Period  Weeks    Status New                   Plan - 04/23/21 1354     Clinical Impression Statement Pt motivated for increased skill with BUE. Pt has improved with overall coordination and strength and continuing to progress.    OT Occupational Profile and History Detailed Assessment- Review of Records and additional review of physical, cognitive, psychosocial history related to current functional performance    Occupational performance deficits (Please refer to evaluation for details): ADL's;IADL's;Leisure;Work;Rest and Sleep    Body Structure / Function / Physical Skills ADL;IADL;ROM;Strength;Decreased knowledge of use of DME;Dexterity;GMC;Pain;Tone;UE functional use;Body mechanics;Balance;Continence;FMC;Muscle spasms;Skin integrity;Flexibility;Mobility;Sensation;Improper spinal/pelvic alignment;Endurance    Rehab Potential Good    Clinical Decision Making Several treatment options, min-mod task modification necessary    Comorbidities Affecting Occupational Performance: May have comorbidities impacting occupational performance    Modification or Assistance to Complete Evaluation  Min-Moderate modification of tasks or assist with assess necessary to complete eval    OT Frequency 2x / week    OT Duration 12 weeks    OT Treatment/Interventions Self-care/ADL training;Moist Heat;Fluidtherapy;DME and/or AE instruction;Splinting;Therapeutic activities;Aquatic Therapy;Ultrasound;Therapeutic exercise;Cognitive remediation/compensation;Passive range of motion;Functional Mobility Training;Neuromuscular education;Electrical Stimulation;Paraffin;Manual Therapy;Patient/family education    Plan try lower body dressing with AE prn. Continue to work towards increased coordination and strength BUE    Consulted and Agree with Plan of Care Patient;Family member/caregiver    Family Member Consulted spouse [name]             Patient will benefit from skilled therapeutic intervention in order to improve the  following deficits and impairments:   Body Structure / Function / Physical Skills: ADL, IADL, ROM, Strength, Decreased knowledge of use of DME, Dexterity, GMC, Pain, Tone, UE functional use, Body mechanics, Balance, Continence, FMC, Muscle spasms, Skin integrity, Flexibility, Mobility, Sensation, Improper spinal/pelvic alignment, Endurance       Visit Diagnosis: Muscle weakness (generalized)  Quadriplegia, C5-C7 incomplete ([facility])  Other lack of coordination  Other symptoms and signs involving the nervous system  Other disturbances of skin sensation    Problem List Patient Active Problem List   Diagnosis Date Noted   Quadriplegia, C5-C7 incomplete ([facility]) 01/16/2021   History of spinal fracture 01/16/2021   Suprapubic catheter ([place]) 01/16/2021   Encounter for routine gynecological examination 09/28/2013   Onychomycosis 09/28/2013   Foot deformity, acquired 03/26/2012   Encounter for preventive health examination 12/25/2010   ROSACEA 08/25/2009   Disturbance in sleep behavior 03/11/2008   SKIN CANCER, HX OF 03/11/2008   DYSURIA, HX OF 03/11/2008   Hyperlipidemia 02/10/2007   CERVICALGIA 02/10/2007    Zachery Conch, OT/L 04/23/2021, 1:55 PM  [facility] [address] [place], [place], [address] Phone: [phone]   Fax:  [phone]  Name: [name] MRN: [id number] Date of Birth: 08-09-1950

## 2021-04-28 ENCOUNTER — Other Ambulatory Visit: Payer: Self-pay

## 2021-04-28 ENCOUNTER — Ambulatory Visit: Payer: Medicare PPO

## 2021-04-28 ENCOUNTER — Encounter: Payer: Self-pay | Admitting: Occupational Therapy

## 2021-04-28 ENCOUNTER — Ambulatory Visit: Payer: Medicare PPO | Admitting: Occupational Therapy

## 2021-04-28 DIAGNOSIS — M6281 Muscle weakness (generalized): Secondary | ICD-10-CM

## 2021-04-28 DIAGNOSIS — G8254 Quadriplegia, C5-C7 incomplete: Secondary | ICD-10-CM

## 2021-04-28 DIAGNOSIS — R29818 Other symptoms and signs involving the nervous system: Secondary | ICD-10-CM

## 2021-04-28 DIAGNOSIS — R293 Abnormal posture: Secondary | ICD-10-CM | POA: Diagnosis not present

## 2021-04-28 DIAGNOSIS — R2689 Other abnormalities of gait and mobility: Secondary | ICD-10-CM | POA: Diagnosis not present

## 2021-04-28 DIAGNOSIS — R208 Other disturbances of skin sensation: Secondary | ICD-10-CM | POA: Diagnosis not present

## 2021-04-28 DIAGNOSIS — R278 Other lack of coordination: Secondary | ICD-10-CM

## 2021-04-28 DIAGNOSIS — G8253 Quadriplegia, C5-C7 complete: Secondary | ICD-10-CM | POA: Diagnosis not present

## 2021-04-28 NOTE — Therapy (Signed)
[facility] [address] [place], [place], [address] Phone: [phone]   Fax:  [phone]  Physical Therapy Treatment/Progress note  Patient Details  Name: [name] MRN: [id number] Date of Birth: August 09, 1950 Referring Provider (PT): Landis Gandy    Progress Note  Reporting period 03/27/21 to 04/28/21  See Note below for Objective Data and Assessment of Progress/Goals   Encounter Date: 04/28/2021   PT End of Session - 04/28/21 1111     Visit Number 20    Number of Visits 25    Date for PT Re-Evaluation 05/15/21    Authorization Type [employer] 25 visits 9/8-12/2/22    Authorization - Visit Number 28    Authorization - Number of Visits 25    Progress Note Due on Visit 20    PT Start Time 1015    PT Stop Time 1103    PT Time Calculation (min) 48 min    Equipment Utilized During Treatment Other (comment)   slideboard   Activity Tolerance Patient tolerated treatment well    Behavior During Therapy [facility] for tasks assessed/performed             Past Medical History:  Diagnosis Date   CERVICAL POLYP 03/11/2008   Qualifier: Diagnosis of  By: Regis Bill MD, Standley Brooking    Colon polyps 2005   on colonscopy Dr. Fuller Plan   Fibroid 2004   Per Dr. Ouida Sills   History of shingles    face and mouth   Hx of skin cancer, basal cell    Rosacea    Sciatica of left side 09/28/2013   Scoliosis    noted on mri done for back pain    Past Surgical History:  Procedure Laterality Date   BUNIONECTOMY      There were no vitals filed for this visit.   Subjective Assessment - 04/28/21 1108     Subjective Pt reports she had a good weekend and is doing well today.    Pain Onset More than a month ago                               [facility] Adult PT Treatment/Exercise - 04/28/21 0001       Transfers   Transfers Lateral/Scoot Transfers    Lateral/Scoot Transfers 4: Min guard;4: Min Psychologist, prison and probation services Details (indicate cue type and reason) Pt able to place slideboard when starting in powerchair supervision with left hand. With return to powerchair from mat needed min assist to place slideboard with right hand and CGA in front for safety. Pt performed slightly uphill transfer CGA and level transfer supervision. PT only assisted to reposition feet with transfer when pt requested. Worked on pt starting to assist more to adjust left foot prior to starting transfer.      Therapeutic Activites    Therapeutic Activities Other Therapeutic Activities    Other Therapeutic Activities Pt performed x8 of hamstring curls BLE on red theraball. PT provided min support of LLE and mod support of RLE. Pt demonstrated more difficulty w/ sliding of RLE back. Pt then provided min resistance to LLE while performing movement x10 and pt performed x5 w/ no resistance on RLE due to pt fatigue. Pt performed 5" isometric holds of hip ABD/ADD in supine x5 BLE. PT provided min assist to maintain foot from sliding down. PT was able to provided min resistance on LLE and no  resistance on RLE. Pt demonstrated more difficulty w/ RLE.      Neuro Re-ed    Neuro Re-ed Details  In long sit: maintaining position without UE support holding purple ball between hands and raising up to shoulder height x10 with cues to try to keep head up for more erect posture. Repeated with performing trunk rotation with ball x 10.      Exercises   Exercises Other Exercises    Other Exercises  Pt performed self hamstring stretch in long sit 1 min x 2. PT performed BLE plantar flexion stretch 3x30" each.                       PT Short Term Goals - 04/16/21 1906       PT SHORT TERM GOAL #1   Title Pt will be able to perform initial HEP for stretching and strengthening with caregiver.    Baseline pt reports "we are starting to work on them". 04/16/21 Pt has been limited on performance as trying to get caregiver to help  her. New one just started and PT started training on stretching today.    Time 4    Period Weeks    Status On-going    Target Date 04/17/21      PT SHORT TERM GOAL #2   Title Pt will be able to perform slideboard transfer on level surface supervision after no more than min assist to help with board placement.    Baseline Pt required max assist for board placement in wheelchair and on mat -  03-19-21. 04/16/21 supervision for transfer other than to readjust feet. Pt able to place board on own with going left and min assist with going right    Time 4    Period Weeks    Status Achieved    Target Date 04/17/21      PT SHORT TERM GOAL #3   Title Pt will be able to perform rolling to each side supervision for improved bed mobility.    Baseline 04/16/21 rolling left supervision using leg loop to help flex LLE up. Min assist to roll left to get RLE in flexed position    Time 4    Period Weeks    Status Partially Met    Target Date 04/17/21      PT SHORT TERM GOAL #4   Title Pt will be able to perform sit to/from supine transfer mod assist for improved bed mobility.    Baseline needs assist to transfer LE's onto mat for sit to supine; able to hook LUE to pull up to sitting from supine to long sitting    Time 4    Period Weeks    Status Achieved    Target Date 03/20/21      PT SHORT TERM GOAL #5   Title Pt will be able to maintain sitting balance edge of mat with only feet supported x 5 min while performing UE movements for improved sitting balance/trunk stability to assist with sitting ADLs.    Baseline met 03-19-21    Time 4    Period Weeks    Status Achieved    Target Date 03/20/21               PT Long Term Goals - 03/19/21 1925       PT LONG TERM GOAL #1   Title Pt will be able to perform progressive HEP for strengthening, stretching and balance to continue gains on  own. (LTGs due 05/15/21)    Time 12    Period Weeks    Status New      PT LONG TERM GOAL #2   Title Pt will  be able to perform squat pivot transfer/lateral scoot transfer without slideboard CGA for improved mobility.    Time 12    Period Weeks    Status New      PT LONG TERM GOAL #3   Title Pt will be able to stand at counter x 2 min min assist for improved standing ability.    Time 12    Period Weeks    Status New      PT LONG TERM GOAL #4   Title Pt will be able to perform all bed mobility CGA for improved function.    Time 12    Period Weeks    Status New      PT LONG TERM GOAL #5   Title Pt will report being able to perform 20 minutes of manual w/c propulsion around home for improved UE strength and mobility. (should be getting manual chair soon)    Time 12    Period Weeks    Status New                   Plan - 04/28/21 1129     Clinical Impression Statement Today's therapy session focused on BLE strengthening to facilitate functional mobility. Pt presents to the clinic w/ improved strength in LLE requiring less PT assitance and tolerating min resistance w/ activities. Pt still presents w/ decreased strength in RLE as compared to LLE.  Pt was able to transfer on to mat w/ less cues but still requires asstiance for foot set up. PT will continue to progress pt as tolerated w/ activities to accomplish pt's LTG.    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navy officer Evolving/Moderate complexity    Rehab Potential Good    PT Frequency 2x / week   plus eval   PT Duration 12 weeks    PT Treatment/Interventions ADLs/Self Care Home Management;Electrical Stimulation;DME Instruction;Neuromuscular re-education;Manual techniques;Therapeutic exercise;Balance training;Therapeutic activities;Cryotherapy;Moist Heat;Functional  mobility training;Stair training;Gait training;Patient/family education;Orthotic Fit/Training;Wheelchair mobility training;Dry needling;Passive range of motion;Vestibular    PT Next Visit Plan Continue SciFit if pt comes in her manual chair? If brings manual chair work on w/c mobility? Slideboard transfer training working towards more level/uphill transfer, bed mobility. Continue to work on functional strengthening for UE, prone position for scapular strengthening. Continue work on long sit with moving legs on mat, balance with less UE support. Tricep dips using yoga blocks to try to get more lift. Utilizing "C" walk to come from sidelying to long sit.  At some point may try bioness to try to get more quad activation with exercises and possibly with standing frame. Pt does have standing frame at home - have encouraged her to use more daily    Consulted and Agree with Plan of Care Patient;Family member/caregiver    Family Member Consulted husband, [name]             Patient will benefit from skilled therapeutic intervention in order to improve the following deficits and impairments:  Decreased balance, Decreased mobility, Decreased strength, Impaired sensation, Postural dysfunction, Impaired flexibility, Impaired UE functional use, Impaired tone, Decreased range of motion  Visit Diagnosis:  Muscle weakness (generalized)  Quadriplegia, C5-C7 incomplete [facility])     Problem List Patient Active Problem List   Diagnosis Date Noted   Quadriplegia, C5-C7 incomplete ([place]) 01/16/2021   History of spinal fracture 01/16/2021   Suprapubic catheter ([place]) 01/16/2021   Encounter for routine gynecological examination 09/28/2013   Onychomycosis 09/28/2013   Foot deformity, acquired 03/26/2012   Encounter for preventive health examination 12/25/2010   ROSACEA 08/25/2009   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 [place], [place], [address] Phone: [phone]   Fax:  [phone]  Name: [name] MRN: [id number] Date of Birth: 1951-05-17

## 2021-04-28 NOTE — Therapy (Signed)
[facility] [address] [place], [place], [address] Phone: [phone]   Fax:  [phone]  Occupational Therapy Treatment  Patient Details  Name: [name] MRN: [id number] Date of Birth: [birth year] Referring Provider (OT): Ina Homes, MD   Encounter Date: 04/28/2021   OT End of Session - 04/28/21 1105     Visit Number 13    Number of Visits 25    Date for OT Re-Evaluation 06/05/21    Authorization Type [employer]    Authorization Time Period Auth Req'd - 16 visits for OT 03/13/21 - 06/08/21    Authorization - Visit Number 13    Authorization - Number of Visits 16    Progress Note Due on Visit 20    OT Start Time 1105    OT Stop Time 1145    OT Time Calculation (min) 40 min    Activity Tolerance Patient tolerated treatment well    Behavior During Therapy [facility] for tasks assessed/performed             Past Medical History:  Diagnosis Date   CERVICAL POLYP 03/11/2008   Qualifier: Diagnosis of  By: Regis Bill MD, Standley Brooking    Colon polyps 2005   on colonscopy Dr. Fuller Plan   Fibroid 2004   Per Dr. Ouida Sills   History of shingles    face and mouth   Hx of skin cancer, basal cell    Rosacea    Sciatica of left side 09/28/2013   Scoliosis    noted on mri done for back pain    Past Surgical History:  Procedure Laterality Date   BUNIONECTOMY      There were no vitals filed for this visit.   Subjective Assessment - 04/28/21 1104     Subjective  "leg work today in PT"    Patient is accompanied by: Family member   caregiver, [name]   Pertinent History hyperlipidemia, scoliosis, sleep disorder    Patient Stated Goals regain arm strength for rolling over in bed and fine motor coordination for increasing typing    Currently in Pain? Yes    Pain Score 4     Pain Location Arm    Pain Orientation Left;Right    Pain Descriptors / Indicators Burning    Pain Type Neuropathic pain    Pain  Onset More than a month ago    Pain Frequency Constant              Resistance Clothespins 1-8# used RUE for yellow clothespins with mod difficulty and placed and removed the remainder with LUE with mod difficulty.   Typing with standard keyboard and taking placement test on [email or web address] scoring 6 wpm with 90% accuracy. Pt req'd to use hunt and peck method with typing and with minimal finger isolation, bilaterally but with L>R.   Rubber Band/ Digit Extension RUE                       OT Short Term Goals - 04/16/21 1116       OT SHORT TERM GOAL #1   Title Pt will be independent with HEP w CG assistance PRN    Time 4    Period Weeks    Status On-going    Target Date 04/13/21      OT SHORT TERM GOAL #2   Title Pt will verbalize understanding and report independence with CG assistance with ues of modalities at home with good  safety.    Baseline has paraffin and Estim unit    Time 4    Period Weeks    Status Achieved   pt has new caregiver she is training for paraffin - reviewed and demonstrated understanding of estim unit     OT SHORT TERM GOAL #3   Title Pt will increase Box and Blocks score with RUE to 30 blocks or greater    Baseline R 26 L 39    Time 4    Period Weeks    Status On-going   R 22 blocks  04/16/21     OT SHORT TERM GOAL #4   Title Pt will increase BUE tricep strength to 4/5 consistently for increasing ability to perform SB transfers with supervision/set up assistance only.    Baseline 3+/5 strength BUE    Time 4    Period Weeks    Status Achieved      OT SHORT TERM GOAL #5   Title Pt will increase coordination in LUE to completing 9 hole peg test in 70 seconds or less.    Baseline L 75.13s, R unable    Time 4    Period Weeks    Status Achieved   L 63.87s 04/16/21     OT SHORT TERM GOAL #6   Title Pt will verbalize understanding of adapted strategies and/or equipment for increasing indepenence with ADLs and IADLs (typing, bathing,  cutting food, etc)    Baseline has U cuff    Time 4    Period Weeks    Status On-going   has verbalized understanding of a lot of AE but continues to benefit from education              OT Long Term Goals - 03/16/21 0943       OT LONG TERM GOAL #1   Title Pt will be independence with any updated HEP    Time 12    Period Weeks    Status New    Target Date 06/08/21      OT LONG TERM GOAL #2   Title Pt will increase functional use of BUE evidenced by completing Box and Blocks with score of 35 or greater with RUE, 45 or greater with LUE.    Baseline R 26, L 39    Time 12    Period Weeks    Status New      OT LONG TERM GOAL #3   Title PT will improve grip strength in BUE by increasing grip strength to 10 lbs or greater with RUE and 20 lbs or greater with LUE.    Baseline R 1.5, L 12.3    Time 12    Period Weeks    Status New      OT LONG TERM GOAL #4   Title Pt will improve isolated finger movements in order to increase skill towards simple typing with adapted strategies and equipment PRN.    Baseline isolated movement in LUE    Time 12    Period Weeks    Status New      OT LONG TERM GOAL #5   Title Pt will improve 9 hole peg test in LUE to completing in 65 seconds or less in order to increase functional use and demonstrate ability to place 2 or more pegs with RUE.    Baseline L 75.13s, R unable    Time 12    Period Weeks    Status New  OT LONG TERM GOAL #6   Title Pt will report completing UB and LB dressing with decreased assistance consistently.    Baseline UB (reports able to do but not consistently doing), LB total A    Time 12    Period Weeks    Status New                   Plan - 04/28/21 1316     Clinical Impression Statement Pt continues to progress. Pt continues to have difficulty with isolated finger movements impeding typing skills for work related tasks.    OT Occupational Profile and History Detailed Assessment- Review of Records  and additional review of physical, cognitive, psychosocial history related to current functional performance    Occupational performance deficits (Please refer to evaluation for details): ADL's;IADL's;Leisure;Work;Rest and Sleep    Body Structure / Function / Physical Skills ADL;IADL;ROM;Strength;Decreased knowledge of use of DME;Dexterity;GMC;Pain;Tone;UE functional use;Body mechanics;Balance;Continence;FMC;Muscle spasms;Skin integrity;Flexibility;Mobility;Sensation;Improper spinal/pelvic alignment;Endurance    Rehab Potential Good    Clinical Decision Making Several treatment options, min-mod task modification necessary    Comorbidities Affecting Occupational Performance: May have comorbidities impacting occupational performance    Modification or Assistance to Complete Evaluation  Min-Moderate modification of tasks or assist with assess necessary to complete eval    OT Frequency 2x / week    OT Duration 12 weeks    OT Treatment/Interventions Self-care/ADL training;Moist Heat;Fluidtherapy;DME and/or AE instruction;Splinting;Therapeutic activities;Aquatic Therapy;Ultrasound;Therapeutic exercise;Cognitive remediation/compensation;Passive range of motion;Functional Mobility Training;Neuromuscular education;Electrical Stimulation;Paraffin;Manual Therapy;Patient/family education    Plan try lower body dressing with AE prn. Continue to work towards increased coordination and strength BUE    Consulted and Agree with Plan of Care Patient;Family member/caregiver    Family Member Consulted spouse [name]             Patient will benefit from skilled therapeutic intervention in order to improve the following deficits and impairments:   Body Structure / Function / Physical Skills: ADL, IADL, ROM, Strength, Decreased knowledge of use of DME, Dexterity, GMC, Pain, Tone, UE functional use, Body mechanics, Balance, Continence, FMC, Muscle spasms, Skin integrity, Flexibility, Mobility, Sensation, Improper  spinal/pelvic alignment, Endurance       Visit Diagnosis: Muscle weakness (generalized)  Quadriplegia, C5-C7 incomplete ([facility])  Other lack of coordination  Other symptoms and signs involving the nervous system  Other disturbances of skin sensation    Problem List Patient Active Problem List   Diagnosis Date Noted   Quadriplegia, C5-C7 incomplete ([place]) 01/16/2021   History of spinal fracture 01/16/2021   Suprapubic catheter ([place]) 01/16/2021   Encounter for routine gynecological examination 09/28/2013   Onychomycosis 09/28/2013   Foot deformity, acquired 03/26/2012   Encounter for preventive health examination 12/25/2010   ROSACEA 08/25/2009   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 [place], [place], [address] Phone: [phone]   Fax:  [phone]  Name: Fabianna Keats MRN: [id number] Date of Birth: 09/29/1950

## 2021-04-30 ENCOUNTER — Ambulatory Visit: Payer: Medicare PPO | Admitting: Occupational Therapy

## 2021-04-30 ENCOUNTER — Ambulatory Visit: Payer: Medicare PPO

## 2021-04-30 ENCOUNTER — Encounter: Payer: Self-pay | Admitting: Occupational Therapy

## 2021-04-30 ENCOUNTER — Other Ambulatory Visit: Payer: Self-pay

## 2021-04-30 DIAGNOSIS — G8254 Quadriplegia, C5-C7 incomplete: Secondary | ICD-10-CM | POA: Diagnosis not present

## 2021-04-30 DIAGNOSIS — R29818 Other symptoms and signs involving the nervous system: Secondary | ICD-10-CM

## 2021-04-30 DIAGNOSIS — R2689 Other abnormalities of gait and mobility: Secondary | ICD-10-CM | POA: Diagnosis not present

## 2021-04-30 DIAGNOSIS — R293 Abnormal posture: Secondary | ICD-10-CM

## 2021-04-30 DIAGNOSIS — R278 Other lack of coordination: Secondary | ICD-10-CM

## 2021-04-30 DIAGNOSIS — R208 Other disturbances of skin sensation: Secondary | ICD-10-CM

## 2021-04-30 DIAGNOSIS — M6281 Muscle weakness (generalized): Secondary | ICD-10-CM | POA: Diagnosis not present

## 2021-04-30 DIAGNOSIS — G8253 Quadriplegia, C5-C7 complete: Secondary | ICD-10-CM | POA: Diagnosis not present

## 2021-04-30 NOTE — Therapy (Signed)
[facility] [address] [place], [place], [address] Phone: [phone]   Fax:  [phone]  Physical Therapy Treatment  Patient Details  Name: [name] MRN: [id number] Date of Birth: 1951/04/04 Referring Provider (PT): Landis Gandy   Encounter Date: 04/30/2021   PT End of Session - 04/30/21 1315     Visit Number 21    Number of Visits 25    Date for PT Re-Evaluation 05/15/21    Authorization Type [employer] 25 visits 9/8-12/2/22    Authorization - Visit Number 21    Authorization - Number of Visits 25    Progress Note Due on Visit 20    PT Start Time 1315    PT Stop Time 1405    PT Time Calculation (min) 50 min    Equipment Utilized During Treatment Other (comment)   slideboard   Activity Tolerance Patient tolerated treatment well    Behavior During Therapy [facility] for tasks assessed/performed             Past Medical History:  Diagnosis Date   CERVICAL POLYP 03/11/2008   Qualifier: Diagnosis of  By: Regis Bill MD, Standley Brooking    Colon polyps 2005   on colonscopy Dr. Fuller Plan   Fibroid 2004   Per Dr. Ouida Sills   History of shingles    face and mouth   Hx of skin cancer, basal cell    Rosacea    Sciatica of left side 09/28/2013   Scoliosis    noted on mri done for back pain    Past Surgical History:  Procedure Laterality Date   BUNIONECTOMY      There were no vitals filed for this visit.   Subjective Assessment - 04/30/21 1316     Subjective Pt denies any changes since last time.    Currently in Pain? Yes    Pain Score 4     Pain Location Arm    Pain Orientation Right;Left    Pain Descriptors / Indicators Burning    Pain Type Neuropathic pain    Pain Onset More than a month ago    Pain Frequency Constant                               [facility] Adult PT Treatment/Exercise - 04/30/21 1316       Bed Mobility   Bed Mobility Rolling Left    Rolling Left Set up  assist   just needed assist to  flex up right knee/hip first. Then used arms to swing across body for momentum     Transfers   Transfers Lateral/Scoot Transfers    Lateral/Scoot Transfers 5: Supervision;4: Min assist    Lateral/Scoot Transfer Details (indicate cue type and reason) Pt needed min assist to fully position slideboard under her when sitting in chair today. With return to chair she was able to place on own coming down on side and back up supervision utilizing her strap on board. She performed transfer supervision both ways other than to reposition feet half way through.      Therapeutic Activites    Therapeutic Activities Other Therapeutic Activities    Other Therapeutic Activities To transition from left sidelying to prone pt needed mod assist to get elbows under her shoulders. To return to supine from prone pt needed min assist to slide left elbow under her chest then she was able to push over with right arm on  her own. From long sit pt moved legs to right off edge of mat with CGA and PT only assisting to lift heels off mat to reduce friction. Once heels off she was able to come around to short sit on own.      Neuro Re-ed    Neuro Re-ed Details  Seated edge of mat: utilized yoga blocks under hands and performed modified tricep dips 5 x 3 with rest breaks between sets holding 3 sec each time. In prone: Pt performed scapular protraction/retraction 10 x 3, walking elbows out to each side x 3 with min assist to stabilize at elbows and to adduct right arm when moving left. Pt took brief rest breaks with pillow under chest for some support. Then performed "Ts" for bilateral scapular retraction with resting chest on pillow 10 x 2. Longsit with blue physioball behind her for light trunk support: bilateral scapular retraction 10 x 3 with red theraband resistance. Long sit with tricep extension 10 x 2 each side with muscle tapping to help facilitate contraction and cues to sit up tall.       Exercises   Exercises Other Exercises    Other Exercises  Long sit self hamstring stretch 30 sec x 3 then PT student adding in overpressure for increased stretch x 30 sec.                       PT Short Term Goals - 04/16/21 1906       PT SHORT TERM GOAL #1   Title Pt will be able to perform initial HEP for stretching and strengthening with caregiver.    Baseline pt reports "we are starting to work on them". 04/16/21 Pt has been limited on performance as trying to get caregiver to help her. New one just started and PT started training on stretching today.    Time 4    Period Weeks    Status On-going    Target Date 04/17/21      PT SHORT TERM GOAL #2   Title Pt will be able to perform slideboard transfer on level surface supervision after no more than min assist to help with board placement.    Baseline Pt required max assist for board placement in wheelchair and on mat -  03-19-21. 04/16/21 supervision for transfer other than to readjust feet. Pt able to place board on own with going left and min assist with going right    Time 4    Period Weeks    Status Achieved    Target Date 04/17/21      PT SHORT TERM GOAL #3   Title Pt will be able to perform rolling to each side supervision for improved bed mobility.    Baseline 04/16/21 rolling left supervision using leg loop to help flex LLE up. Min assist to roll left to get RLE in flexed position    Time 4    Period Weeks    Status Partially Met    Target Date 04/17/21      PT SHORT TERM GOAL #4   Title Pt will be able to perform sit to/from supine transfer mod assist for improved bed mobility.    Baseline needs assist to transfer LE's onto mat for sit to supine; able to hook LUE to pull up to sitting from supine to long sitting    Time 4    Period Weeks    Status Achieved    Target Date 03/20/21  PT SHORT TERM GOAL #5   Title Pt will be able to maintain sitting balance edge of mat with only feet supported x 5 min  while performing UE movements for improved sitting balance/trunk stability to assist with sitting ADLs.    Baseline met 03-19-21    Time 4    Period Weeks    Status Achieved    Target Date 03/20/21               PT Long Term Goals - 03/19/21 1925       PT LONG TERM GOAL #1   Title Pt will be able to perform progressive HEP for strengthening, stretching and balance to continue gains on own. (LTGs due 05/15/21)    Time 12    Period Weeks    Status New      PT LONG TERM GOAL #2   Title Pt will be able to perform squat pivot transfer/lateral scoot transfer without slideboard CGA for improved mobility.    Time 12    Period Weeks    Status New      PT LONG TERM GOAL #3   Title Pt will be able to stand at counter x 2 min min assist for improved standing ability.    Time 12    Period Weeks    Status New      PT LONG TERM GOAL #4   Title Pt will be able to perform all bed mobility CGA for improved function.    Time 12    Period Weeks    Status New      PT LONG TERM GOAL #5   Title Pt will report being able to perform 20 minutes of manual w/c propulsion around home for improved UE strength and mobility. (should be getting manual chair soon)    Time 12    Period Weeks    Status New                   Plan - 04/30/21 1434     Clinical Impression Statement PT focused on UE strengthening today. Pt continues to show improved functional mobility on mat requiring less assistance with mobility. She did well in prone with no winging noted at scapulas.    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[employer] officer Evolving/Moderate complexity    Rehab Potential Good    PT Frequency 2x / week   plus eval   PT Duration  12 weeks    PT Treatment/Interventions ADLs/Self Care Home Management;Electrical Stimulation;DME Instruction;Neuromuscular re-education;Manual techniques;Therapeutic exercise;Balance training;Therapeutic activities;Cryotherapy;Moist Heat;Functional mobility training;Stair training;Gait training;Patient/family education;Orthotic Fit/Training;Wheelchair mobility training;Dry needling;Passive range of motion;Vestibular    PT Next Visit Plan Continue SciFit if pt comes in her manual chair? If brings manual chair work on w/c mobility? Slideboard transfer training working towards more level/uphill transfer, bed mobility. Continue to work on functional strengthening for UE, prone position for scapular strengthening. Continue work on long sit with moving legs on mat, balance with less UE support. Tricep dips using yoga blocks to try to get more lift. Utilizing "C" walk to come from sidelying to long sit.  At some point may try bioness to try to get more quad activation with exercises and possibly with standing frame. Pt does have standing frame at home - have encouraged her to use more daily. Should  we consider increasing frequency to 3x/week when schedule allows in recert as she is showing good progress and very motivated?    Consulted and Agree with Plan of Care Patient;Family member/caregiver    Family Member Consulted husband, [name]             Patient will benefit from skilled therapeutic intervention in order to improve the following deficits and impairments:  Decreased balance, Decreased mobility, Decreased strength, Impaired sensation, Postural dysfunction, Impaired flexibility, Impaired UE functional use, Impaired tone, Decreased range of motion  Visit Diagnosis: Muscle weakness (generalized)  Quadriplegia, C5-C7 incomplete (HCC)  Abnormal posture     Problem List Patient Active Problem List   Diagnosis Date Noted   Quadriplegia, C5-C7 incomplete ([place]) 01/16/2021   History of spinal  fracture 01/16/2021   Suprapubic catheter ([place]) 01/16/2021   Encounter for routine gynecological examination 09/28/2013   Onychomycosis 09/28/2013   Foot deformity, acquired 03/26/2012   Encounter for preventive health examination 12/25/2010   ROSACEA 08/25/2009   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 [place], [place], [address] Phone: [phone]   Fax:  [phone]  Name: [name] MRN: [id number] Date of Birth: 18-Aug-1950

## 2021-04-30 NOTE — Therapy (Signed)
[facility] [address] [place], [place], [address] Phone: [phone]   Fax:  [phone]  Occupational Therapy Treatment  Patient Details  Name: [name] MRN: [id number] Date of Birth: 1950/08/08 Referring Provider (OT): Ina Homes, MD   Encounter Date: 04/30/2021   OT End of Session - 04/30/21 1235     Visit Number 14    Number of Visits 25    Date for OT Re-Evaluation 06/05/21    Authorization Type [employer]    Authorization Time Period Auth Req'd - 16 visits for OT 03/13/21 - 06/08/21    Authorization - Visit Number 14    Authorization - Number of Visits 16    Progress Note Due on Visit 20    OT Start Time 1233    OT Stop Time 1315    OT Time Calculation (min) 42 min    Activity Tolerance Patient tolerated treatment well    Behavior During Therapy [facility] for tasks assessed/performed             Pt only approved for 16 visits - submit for more visits with [facility] Medicare next visit.   Past Medical History:  Diagnosis Date   CERVICAL POLYP 03/11/2008   Qualifier: Diagnosis of  By: Regis Bill MD, Standley Brooking    Colon polyps 2005   on colonscopy Dr. Fuller Plan   Fibroid 2004   Per Dr. Ouida Sills   History of shingles    face and mouth   Hx of skin cancer, basal cell    Rosacea    Sciatica of left side 09/28/2013   Scoliosis    noted on mri done for back pain    Past Surgical History:  Procedure Laterality Date   BUNIONECTOMY      There were no vitals filed for this visit.   Subjective Assessment - 04/30/21 1235     Subjective  "doing alright"    Patient is accompanied by: Family member   caregiver, [name]   Pertinent History hyperlipidemia, scoliosis, sleep disorder    Patient Stated Goals regain arm strength for rolling over in bed and fine motor coordination for increasing typing    Currently in Pain? Yes    Pain Score 4     Pain Location Arm    Pain Orientation  Left;Right    Pain Descriptors / Indicators Burning    Pain Type Neuropathic pain    Pain Onset More than a month ago    Pain Frequency Constant                Connect Four with RUE with mod difficulty but increased ease with grasp and placing into frame. Pt reports moderate fatigue in shoulder with reaching.  Medium Pegs with LUE with min/mod difficulty. Removed pegs with RUE with min difficulty.  ADLs discussed UB dressing. Pt reports putting her own shirt on today but req'd min A for pulling shirt down in the back. Discussed strategies and techniques for pulling shirt down (loops on front, using hands to pull down in front and to side enough to pull down in back)  Buttons for isolated finger movements and working on fine motor coordination. Finger walks on towel for isolated finger movements                 OT Short Term Goals - 04/16/21 1116       OT SHORT TERM GOAL #1   Title Pt will be independent with HEP w  CG assistance PRN    Time 4    Period Weeks    Status On-going    Target Date 04/13/21      OT SHORT TERM GOAL #2   Title Pt will verbalize understanding and report independence with CG assistance with ues of modalities at home with good safety.    Baseline has paraffin and Estim unit    Time 4    Period Weeks    Status Achieved   pt has new caregiver she is training for paraffin - reviewed and demonstrated understanding of estim unit     OT SHORT TERM GOAL #3   Title Pt will increase Box and Blocks score with RUE to 30 blocks or greater    Baseline R 26 L 39    Time 4    Period Weeks    Status On-going   R 22 blocks  04/16/21     OT SHORT TERM GOAL #4   Title Pt will increase BUE tricep strength to 4/5 consistently for increasing ability to perform SB transfers with supervision/set up assistance only.    Baseline 3+/5 strength BUE    Time 4    Period Weeks    Status Achieved      OT SHORT TERM GOAL #5   Title Pt will increase coordination  in LUE to completing 9 hole peg test in 70 seconds or less.    Baseline L 75.13s, R unable    Time 4    Period Weeks    Status Achieved   L 63.87s 04/16/21     OT SHORT TERM GOAL #6   Title Pt will verbalize understanding of adapted strategies and/or equipment for increasing indepenence with ADLs and IADLs (typing, bathing, cutting food, etc)    Baseline has U cuff    Time 4    Period Weeks    Status On-going   has verbalized understanding of a lot of AE but continues to benefit from education              OT Long Term Goals - 03/16/21 0943       OT LONG TERM GOAL #1   Title Pt will be independence with any updated HEP    Time 12    Period Weeks    Status New    Target Date 06/08/21      OT LONG TERM GOAL #2   Title Pt will increase functional use of BUE evidenced by completing Box and Blocks with score of 35 or greater with RUE, 45 or greater with LUE.    Baseline R 26, L 39    Time 12    Period Weeks    Status New      OT LONG TERM GOAL #3   Title PT will improve grip strength in BUE by increasing grip strength to 10 lbs or greater with RUE and 20 lbs or greater with LUE.    Baseline R 1.5, L 12.3    Time 12    Period Weeks    Status New      OT LONG TERM GOAL #4   Title Pt will improve isolated finger movements in order to increase skill towards simple typing with adapted strategies and equipment PRN.    Baseline isolated movement in LUE    Time 12    Period Weeks    Status New      OT LONG TERM GOAL #5   Title Pt will improve 9 hole  peg test in LUE to completing in 65 seconds or less in order to increase functional use and demonstrate ability to place 2 or more pegs with RUE.    Baseline L 75.13s, R unable    Time 12    Period Weeks    Status New      OT LONG TERM GOAL #6   Title Pt will report completing UB and LB dressing with decreased assistance consistently.    Baseline UB (reports able to do but not consistently doing), LB total A    Time 12     Period Weeks    Status New                   Plan - 04/30/21 1317     Clinical Impression Statement Pt progressing towards goals. Pt continues to progress with BUE cooridnation and strength but still with deficits with isolated finger movements.    OT Occupational Profile and History Detailed Assessment- Review of Records and additional review of physical, cognitive, psychosocial history related to current functional performance    Occupational performance deficits (Please refer to evaluation for details): ADL's;IADL's;Leisure;Work;Rest and Sleep    Body Structure / Function / Physical Skills ADL;IADL;ROM;Strength;Decreased knowledge of use of DME;Dexterity;GMC;Pain;Tone;UE functional use;Body mechanics;Balance;Continence;FMC;Muscle spasms;Skin integrity;Flexibility;Mobility;Sensation;Improper spinal/pelvic alignment;Endurance    Rehab Potential Good    Clinical Decision Making Several treatment options, min-mod task modification necessary    Comorbidities Affecting Occupational Performance: May have comorbidities impacting occupational performance    Modification or Assistance to Complete Evaluation  Min-Moderate modification of tasks or assist with assess necessary to complete eval    OT Frequency 2x / week    OT Duration 12 weeks    OT Treatment/Interventions Self-care/ADL training;Moist Heat;Fluidtherapy;DME and/or AE instruction;Splinting;Therapeutic activities;Aquatic Therapy;Ultrasound;Therapeutic exercise;Cognitive remediation/compensation;Passive range of motion;Functional Mobility Training;Neuromuscular education;Electrical Stimulation;Paraffin;Manual Therapy;Patient/family education    Plan maybe try mat work - shoulders, LB  dressing    Consulted and Agree with Plan of Care Patient;Family member/caregiver    Family Member Consulted spouse [name]             Patient will benefit from skilled therapeutic intervention in order to improve the following deficits and  impairments:   Body Structure / Function / Physical Skills: ADL, IADL, ROM, Strength, Decreased knowledge of use of DME, Dexterity, GMC, Pain, Tone, UE functional use, Body mechanics, Balance, Continence, FMC, Muscle spasms, Skin integrity, Flexibility, Mobility, Sensation, Improper spinal/pelvic alignment, Endurance       Visit Diagnosis: Muscle weakness (generalized)  Quadriplegia, C5-C7 incomplete (HCC)  Other lack of coordination  Other symptoms and signs involving the nervous system  Other disturbances of skin sensation  Abnormal posture    Problem List Patient Active Problem List   Diagnosis Date Noted   Quadriplegia, C5-C7 incomplete ([place]) 01/16/2021   History of spinal fracture 01/16/2021   Suprapubic catheter ([place]) 01/16/2021   Encounter for routine gynecological examination 09/28/2013   Onychomycosis 09/28/2013   Foot deformity, acquired 03/26/2012   Encounter for preventive health examination 12/25/2010   ROSACEA 08/25/2009   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

## 2021-05-01 ENCOUNTER — Encounter: Payer: Self-pay | Admitting: Internal Medicine

## 2021-05-01 MED ORDER — BACLOFEN 20 MG PO TABS: ORAL_TABLET | ORAL | 1 refills | Status: DC

## 2021-05-05 ENCOUNTER — Ambulatory Visit: Payer: Medicare PPO

## 2021-05-05 ENCOUNTER — Encounter: Payer: Self-pay | Admitting: Occupational Therapy

## 2021-05-05 ENCOUNTER — Other Ambulatory Visit: Payer: Self-pay

## 2021-05-05 ENCOUNTER — Ambulatory Visit: Payer: Medicare PPO | Admitting: Occupational Therapy

## 2021-05-05 ENCOUNTER — Encounter: Payer: Medicare PPO | Admitting: Occupational Therapy

## 2021-05-05 DIAGNOSIS — K592 Neurogenic bowel, not elsewhere classified: Secondary | ICD-10-CM | POA: Diagnosis not present

## 2021-05-05 DIAGNOSIS — M6281 Muscle weakness (generalized): Secondary | ICD-10-CM | POA: Diagnosis not present

## 2021-05-05 DIAGNOSIS — R2689 Other abnormalities of gait and mobility: Secondary | ICD-10-CM | POA: Diagnosis not present

## 2021-05-05 DIAGNOSIS — R208 Other disturbances of skin sensation: Secondary | ICD-10-CM | POA: Diagnosis not present

## 2021-05-05 DIAGNOSIS — Z7409 Other reduced mobility: Secondary | ICD-10-CM | POA: Diagnosis not present

## 2021-05-05 DIAGNOSIS — R29818 Other symptoms and signs involving the nervous system: Secondary | ICD-10-CM

## 2021-05-05 DIAGNOSIS — R278 Other lack of coordination: Secondary | ICD-10-CM

## 2021-05-05 DIAGNOSIS — R293 Abnormal posture: Secondary | ICD-10-CM | POA: Diagnosis not present

## 2021-05-05 DIAGNOSIS — G8254 Quadriplegia, C5-C7 incomplete: Secondary | ICD-10-CM

## 2021-05-05 DIAGNOSIS — G8253 Quadriplegia, C5-C7 complete: Secondary | ICD-10-CM | POA: Diagnosis not present

## 2021-05-05 DIAGNOSIS — G890 Central pain syndrome: Secondary | ICD-10-CM | POA: Diagnosis not present

## 2021-05-05 DIAGNOSIS — N319 Neuromuscular dysfunction of bladder, unspecified: Secondary | ICD-10-CM | POA: Diagnosis not present

## 2021-05-05 DIAGNOSIS — Z789 Other specified health status: Secondary | ICD-10-CM | POA: Diagnosis not present

## 2021-05-05 NOTE — Therapy (Signed)
[facility] [address] [place], [place], [address] Phone: [phone]   Fax:  [phone]  Occupational Therapy Treatment  Patient Details  Name: [name] MRN: [id number] Date of Birth: 01-07-1951 Referring Provider (OT): Ina Homes, MD   Encounter Date: 05/05/2021   OT End of Session - 05/05/21 1019     Visit Number 15    Number of Visits 25    Date for OT Re-Evaluation 06/05/21    Authorization Type [employer]    Authorization Time Period Auth Req'd - 16 visits for OT 03/13/21 - 06/08/21    Authorization - Visit Number 15    Authorization - Number of Visits 16    Progress Note Due on Visit 20    OT Start Time 1018    OT Stop Time 1100    OT Time Calculation (min) 42 min    Activity Tolerance Patient tolerated treatment well    Behavior During Therapy [facility] for tasks assessed/performed             Past Medical History:  Diagnosis Date   CERVICAL POLYP 03/11/2008   Qualifier: Diagnosis of  By: Regis Bill MD, Standley Brooking    Colon polyps 2005   on colonscopy Dr. Fuller Plan   Fibroid 2004   Per Dr. Ouida Sills   History of shingles    face and mouth   Hx of skin cancer, basal cell    Rosacea    Sciatica of left side 09/28/2013   Scoliosis    noted on mri done for back pain    Past Surgical History:  Procedure Laterality Date   BUNIONECTOMY      There were no vitals filed for this visit.   Subjective Assessment - 05/05/21 1019     Subjective  "it's busy in here today"    Patient is accompanied by: Family member   caregiver, [name]   Pertinent History hyperlipidemia, scoliosis, sleep disorder    Patient Stated Goals regain arm strength for rolling over in bed and fine motor coordination for increasing typing    Pain Onset More than a month ago                          OT Treatments/Exercises (OP) - 05/05/21 1031       Exercises   Exercises Hand;Shoulder       Shoulder Exercises: Supine   Other Supine Exercises AROM without resistance x 10 reps - shoulder flexion, chest press, horizontal abduction with foam roll    Other Supine Exercises x 2 lb wrist weights x 10 reps - shoulder flexion, chest press and horizontal abduction      Fine Motor Coordination (Hand/Wrist)   Fine Motor Coordination Large Pegboard    Large Pegboard Semi  Circle Pegboard with placing white pegs with RUE and red and gray with LUE. Mod difficulty and drops today.   completed with sitting edge of mat for increased trunk and core control                     OT Short Term Goals - 04/16/21 1116       OT SHORT TERM GOAL #1   Title Pt will be independent with HEP w CG assistance PRN    Time 4    Period Weeks    Status On-going    Target Date 04/13/21      OT SHORT TERM GOAL #2  Title Pt will verbalize understanding and report independence with CG assistance with ues of modalities at home with good safety.    Baseline has paraffin and Estim unit    Time 4    Period Weeks    Status Achieved   pt has new caregiver she is training for paraffin - reviewed and demonstrated understanding of estim unit     OT SHORT TERM GOAL #3   Title Pt will increase Box and Blocks score with RUE to 30 blocks or greater    Baseline R 26 L 39    Time 4    Period Weeks    Status On-going   R 22 blocks  04/16/21     OT SHORT TERM GOAL #4   Title Pt will increase BUE tricep strength to 4/5 consistently for increasing ability to perform SB transfers with supervision/set up assistance only.    Baseline 3+/5 strength BUE    Time 4    Period Weeks    Status Achieved      OT SHORT TERM GOAL #5   Title Pt will increase coordination in LUE to completing 9 hole peg test in 70 seconds or less.    Baseline L 75.13s, R unable    Time 4    Period Weeks    Status Achieved   L 63.87s 04/16/21     OT SHORT TERM GOAL #6   Title Pt will verbalize understanding of adapted strategies and/or  equipment for increasing indepenence with ADLs and IADLs (typing, bathing, cutting food, etc)    Baseline has U cuff    Time 4    Period Weeks    Status On-going   has verbalized understanding of a lot of AE but continues to benefit from education              OT Long Term Goals - 03/16/21 0943       OT LONG TERM GOAL #1   Title Pt will be independence with any updated HEP    Time 12    Period Weeks    Status New    Target Date 06/08/21      OT LONG TERM GOAL #2   Title Pt will increase functional use of BUE evidenced by completing Box and Blocks with score of 35 or greater with RUE, 45 or greater with LUE.    Baseline R 26, L 39    Time 12    Period Weeks    Status New      OT LONG TERM GOAL #3   Title PT will improve grip strength in BUE by increasing grip strength to 10 lbs or greater with RUE and 20 lbs or greater with LUE.    Baseline R 1.5, L 12.3    Time 12    Period Weeks    Status New      OT LONG TERM GOAL #4   Title Pt will improve isolated finger movements in order to increase skill towards simple typing with adapted strategies and equipment PRN.    Baseline isolated movement in LUE    Time 12    Period Weeks    Status New      OT LONG TERM GOAL #5   Title Pt will improve 9 hole peg test in LUE to completing in 65 seconds or less in order to increase functional use and demonstrate ability to place 2 or more pegs with RUE.    Baseline L 75.13s, R unable  Time 12    Period Weeks    Status New      OT LONG TERM GOAL #6   Title Pt will report completing UB and LB dressing with decreased assistance consistently.    Baseline UB (reports able to do but not consistently doing), LB total A    Time 12    Period Weeks    Status New                   Plan - 05/05/21 1103     Clinical Impression Statement Pt is progressing towards goals. Increased RUE control and strength and coordination today.    OT Occupational Profile and History Detailed  Assessment- Review of Records and additional review of physical, cognitive, psychosocial history related to current functional performance    Occupational performance deficits (Please refer to evaluation for details): ADL's;IADL's;Leisure;Work;Rest and Sleep    Body Structure / Function / Physical Skills ADL;IADL;ROM;Strength;Decreased knowledge of use of DME;Dexterity;GMC;Pain;Tone;UE functional use;Body mechanics;Balance;Continence;FMC;Muscle spasms;Skin integrity;Flexibility;Mobility;Sensation;Improper spinal/pelvic alignment;Endurance    Rehab Potential Good    Clinical Decision Making Several treatment options, min-mod task modification necessary    Comorbidities Affecting Occupational Performance: May have comorbidities impacting occupational performance    Modification or Assistance to Complete Evaluation  Min-Moderate modification of tasks or assist with assess necessary to complete eval    OT Frequency 2x / week    OT Duration 12 weeks    OT Treatment/Interventions Self-care/ADL training;Moist Heat;Fluidtherapy;DME and/or AE instruction;Splinting;Therapeutic activities;Aquatic Therapy;Ultrasound;Therapeutic exercise;Cognitive remediation/compensation;Passive range of motion;Functional Mobility Training;Neuromuscular education;Electrical Stimulation;Paraffin;Manual Therapy;Patient/family education    Plan mat work with progressing towards LB Dressing, RUE and LUE coordination (RUE< LUE)    Consulted and Agree with Plan of Care Patient;Family member/caregiver    Family Member Consulted spouse [name]             Patient will benefit from skilled therapeutic intervention in order to improve the following deficits and impairments:   Body Structure / Function / Physical Skills: ADL, IADL, ROM, Strength, Decreased knowledge of use of DME, Dexterity, GMC, Pain, Tone, UE functional use, Body mechanics, Balance, Continence, FMC, Muscle spasms, Skin integrity, Flexibility, Mobility, Sensation,  Improper spinal/pelvic alignment, Endurance       Visit Diagnosis: Muscle weakness (generalized)  Quadriplegia, C5-C7 incomplete (HCC)  Other lack of coordination  Other symptoms and signs involving the nervous system  Other disturbances of skin sensation    Problem List Patient Active Problem List   Diagnosis Date Noted   Quadriplegia, C5-C7 incomplete ([place]) 01/16/2021   History of spinal fracture 01/16/2021   Suprapubic catheter ([place]) 01/16/2021   Encounter for routine gynecological examination 09/28/2013   Onychomycosis 09/28/2013   Foot deformity, acquired 03/26/2012   Encounter for preventive health examination 12/25/2010   ROSACEA 08/25/2009   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 [place], [place], [address] Phone: [phone]   Fax:  [phone]  Name: [name] MRN: [id number] Date of Birth: [birth year]

## 2021-05-05 NOTE — Therapy (Signed)
[facility] [address] [place], [place], [address] Phone: [phone]   Fax:  [phone]  Physical Therapy Treatment  Patient Details  Name: [name] MRN: [id number] Date of Birth: 01-18-1951 Referring Provider (PT): Landis Gandy   Encounter Date: 05/05/2021   PT End of Session - 05/05/21 1257     Visit Number 22    Number of Visits 25    Date for PT Re-Evaluation 05/15/21    Authorization Type [employer] 25 visits 9/8-12/2/22    Authorization - Visit Number 21    Authorization - Number of Visits 25    Progress Note Due on Visit 20    PT Start Time 1100    PT Stop Time 1145    PT Time Calculation (min) 45 min    Equipment Utilized During Treatment Other (comment)   slideboard   Activity Tolerance Patient tolerated treatment well    Behavior During Therapy [facility] for tasks assessed/performed             Past Medical History:  Diagnosis Date   CERVICAL POLYP 03/11/2008   Qualifier: Diagnosis of  By: Regis Bill MD, Standley Brooking    Colon polyps 2005   on colonscopy Dr. Fuller Plan   Fibroid 2004   Per Dr. Ouida Sills   History of shingles    face and mouth   Hx of skin cancer, basal cell    Rosacea    Sciatica of left side 09/28/2013   Scoliosis    noted on mri done for back pain    Past Surgical History:  Procedure Laterality Date   BUNIONECTOMY      There were no vitals filed for this visit.   Subjective Assessment - 05/05/21 1100     Subjective Pt denies any changes since last time.    Currently in Pain? Yes    Pain Score 3     Pain Location Arm   arms (elbows to finger tips)   Pain Orientation Left;Right    Pain Descriptors / Indicators Pins and needles;Other (Comment)   Neuropathic pain   Pain Type Chronic pain    Pain Onset More than a month ago                               [facility] Adult PT Treatment/Exercise - 05/05/21 1301       Bed Mobility   Bed  Mobility Rolling Right    Rolling Left Set up assist   Asstiance needed to flex pt's knee and hip and hold it in place as pt uses momentum to roll     Transfers   Transfers Lateral/Scoot Transfers    Lateral/Scoot Transfers 5: Supervision;4: Min assist    Lateral/Scoot Transfer Details (indicate cue type and reason) Pt able to place slide board indpendently      Therapeutic Activites    Therapeutic Activities Other Therapeutic Activities    Other Therapeutic Activities Pt performed bed mobility activities today focused on going from supine to right sidelying and then to long sitting. Pt needed set up assist to flex L knee and hip while the roll. PT provided mod assist to put pt's R elbow under her and then min assist to help pt scoot elbow across the mat. Pt was able to pull herself up once she got ahold of her other leg and got more in her C. PT provided verbal cues to keep weight forward  to be able to pull self up. Pt performed 3 sets of 5 of resisted posterior/anterior pelvic rotation in right sidelying propped on right elbow. PT provided some resistance while moving posterior and facilitation when moving anteriorly. Pt had more difficulty bring pelvis forward than backward. Pt performed walking hands back from long sitting to C shape on R focused on breaking down movement of coming up. Pt required min assist to keep elbow in place but improved once she started using head momentumand rocking with pulling on left leg with left arm to get up. PT provided min guard and verbal cues for technique. Long sit to moving off edge of mat with min assist to unweight feet with pt pushing legs over off edge using hands.      Exercises   Exercises Other Exercises    Other Exercises  Long sit self hamstring stretch 30 sec x 3 then PT student adding in overpressure for increased stretch x 30 sec. Pt performed 2x10 of L glute set w/ pelvic anterior to faciliate rolling in hooklying position. PT stabilized at foot. PT  provided verbal cues for technique and slight faciliation to hip up.                       PT Short Term Goals - 04/16/21 1906       PT SHORT TERM GOAL #1   Title Pt will be able to perform initial HEP for stretching and strengthening with caregiver.    Baseline pt reports "we are starting to work on them". 04/16/21 Pt has been limited on performance as trying to get caregiver to help her. New one just started and PT started training on stretching today.    Time 4    Period Weeks    Status On-going    Target Date 04/17/21      PT SHORT TERM GOAL #2   Title Pt will be able to perform slideboard transfer on level surface supervision after no more than min assist to help with board placement.    Baseline Pt required max assist for board placement in wheelchair and on mat -  03-19-21. 04/16/21 supervision for transfer other than to readjust feet. Pt able to place board on own with going left and min assist with going right    Time 4    Period Weeks    Status Achieved    Target Date 04/17/21      PT SHORT TERM GOAL #3   Title Pt will be able to perform rolling to each side supervision for improved bed mobility.    Baseline 04/16/21 rolling left supervision using leg loop to help flex LLE up. Min assist to roll left to get RLE in flexed position    Time 4    Period Weeks    Status Partially Met    Target Date 04/17/21      PT SHORT TERM GOAL #4   Title Pt will be able to perform sit to/from supine transfer mod assist for improved bed mobility.    Baseline needs assist to transfer LE's onto mat for sit to supine; able to hook LUE to pull up to sitting from supine to long sitting    Time 4    Period Weeks    Status Achieved    Target Date 03/20/21      PT SHORT TERM GOAL #5   Title Pt will be able to maintain sitting balance edge of mat with only feet  supported x 5 min while performing UE movements for improved sitting balance/trunk stability to assist with sitting ADLs.     Baseline met 03-19-21    Time 4    Period Weeks    Status Achieved    Target Date 03/20/21               PT Long Term Goals - 03/19/21 1925       PT LONG TERM GOAL #1   Title Pt will be able to perform progressive HEP for strengthening, stretching and balance to continue gains on own. (LTGs due 05/15/21)    Time 12    Period Weeks    Status New      PT LONG TERM GOAL #2   Title Pt will be able to perform squat pivot transfer/lateral scoot transfer without slideboard CGA for improved mobility.    Time 12    Period Weeks    Status New      PT LONG TERM GOAL #3   Title Pt will be able to stand at counter x 2 min min assist for improved standing ability.    Time 12    Period Weeks    Status New      PT LONG TERM GOAL #4   Title Pt will be able to perform all bed mobility CGA for improved function.    Time 12    Period Weeks    Status New      PT LONG TERM GOAL #5   Title Pt will report being able to perform 20 minutes of manual w/c propulsion around home for improved UE strength and mobility. (should be getting manual chair soon)    Time 12    Period Weeks    Status New                   Plan - 05/05/21 1258     Clinical Impression Statement Pt continues to improve on independence w/ bed mobility. PT still required to assist w/ set up and provided min assistance when pt is rolling and C walking from supine to sit. Pt continues to demonstrate improved UE strength as she is able to pull her legs off the table mostly independently. PT will continue to progress pt as tolerated w/ activities to facilitate pt achievement of LTG.    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 officer Evolving/Moderate  complexity    Rehab Potential Good    PT Frequency 2x / week   plus eval   PT Duration 12 weeks    PT Treatment/Interventions ADLs/Self Care Home Management;Electrical Stimulation;DME Instruction;Neuromuscular re-education;Manual techniques;Therapeutic exercise;Balance training;Therapeutic activities;Cryotherapy;Moist Heat;Functional mobility training;Stair training;Gait training;Patient/family education;Orthotic Fit/Training;Wheelchair mobility training;Dry needling;Passive range of motion;Vestibular    PT Next Visit Plan Have pt schedule out further increasing to 3x/week adding Wednesday visit when able after 95/1 for new cert period. RE-cert due next week. Continue SciFit if pt comes in her manual chair? If brings manual chair work on w/c mobility? Slideboard transfer training working towards more level/uphill transfer, bed mobility. Continue to work on functional strengthening for UE, prone position for scapular strengthening. Continue work on long sit with moving legs on mat, balance with less UE support. Tricep dips using yoga blocks to try to get more lift. Utilizing "C" walk to come from sidelying to long  sit.  At some point may try bioness to try to get more quad activation with exercises and possibly with standing frame. Pt does have standing frame at home - have encouraged her to use more daily.    Consulted and Agree with Plan of Care Patient;Family member/caregiver    Family Member Consulted husband, [name]             Patient will benefit from skilled therapeutic intervention in order to improve the following deficits and impairments:  Decreased balance, Decreased mobility, Decreased strength, Impaired sensation, Postural dysfunction, Impaired flexibility, Impaired UE functional use, Impaired tone, Decreased range of motion  Visit Diagnosis: Muscle weakness (generalized)  Quadriplegia, C5-C7 incomplete ([place])     Problem List Patient Active Problem List   Diagnosis Date Noted    Quadriplegia, C5-C7 incomplete ([place]) 01/16/2021   History of spinal fracture 01/16/2021   Suprapubic catheter (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

## 2021-05-06 ENCOUNTER — Ambulatory Visit: Payer: Medicare PPO | Admitting: Occupational Therapy

## 2021-05-06 ENCOUNTER — Ambulatory Visit: Payer: Medicare PPO

## 2021-05-06 ENCOUNTER — Encounter: Payer: Self-pay | Admitting: Occupational Therapy

## 2021-05-06 DIAGNOSIS — G8254 Quadriplegia, C5-C7 incomplete: Secondary | ICD-10-CM

## 2021-05-06 DIAGNOSIS — R29818 Other symptoms and signs involving the nervous system: Secondary | ICD-10-CM

## 2021-05-06 DIAGNOSIS — R208 Other disturbances of skin sensation: Secondary | ICD-10-CM | POA: Diagnosis not present

## 2021-05-06 DIAGNOSIS — M6281 Muscle weakness (generalized): Secondary | ICD-10-CM

## 2021-05-06 DIAGNOSIS — G8253 Quadriplegia, C5-C7 complete: Secondary | ICD-10-CM | POA: Diagnosis not present

## 2021-05-06 DIAGNOSIS — R293 Abnormal posture: Secondary | ICD-10-CM | POA: Diagnosis not present

## 2021-05-06 DIAGNOSIS — R278 Other lack of coordination: Secondary | ICD-10-CM

## 2021-05-06 DIAGNOSIS — R2689 Other abnormalities of gait and mobility: Secondary | ICD-10-CM | POA: Diagnosis not present

## 2021-05-06 NOTE — Therapy (Signed)
[facility] [address] [place], [place], [address] Phone: [phone]   Fax:  [phone]  Physical Therapy Treatment  Patient Details  Name: [name] MRN: [id number] Date of Birth: 12-Jun-1951 Referring Provider (PT): Landis Gandy   Encounter Date: 05/06/2021   PT End of Session - 05/06/21 1620     Visit Number 23    Number of Visits 25    Date for PT Re-Evaluation 05/15/21    Authorization Type [employer] medic[employer] 25 visits 9/8-12/2/22    Authorization - Visit Number 83    Authorization - Number of Visits 25    Progress Note Due on Visit 20    PT Start Time 1620    PT Stop Time 1702    PT Time Calculation (min) 42 min    Equipment Utilized During Treatment Other (comment)   slideboard   Activity Tolerance Patient tolerated treatment well    Behavior During Therapy [facility] for tasks assessed/performed             Past Medical History:  Diagnosis Date   CERVICAL POLYP 03/11/2008   Qualifier: Diagnosis of  By: Regis Bill MD, Standley Brooking    Colon polyps 2005   on colonscopy Dr. Fuller Plan   Fibroid 2004   Per Dr. Ouida Sills   History of shingles    face and mouth   Hx of skin cancer, basal cell    Rosacea    Sciatica of left side 09/28/2013   Scoliosis    noted on mri done for back pain    Past Surgical History:  Procedure Laterality Date   BUNIONECTOMY      There were no vitals filed for this visit.   Subjective Assessment - 05/06/21 1621     Subjective Pt reports that her right upper arm was sore a bit after last time but no shoulder issues.    Currently in Pain? Yes    Pain Score 3     Pain Location Arm    Pain Orientation Right;Left    Pain Descriptors / Indicators Pins and needles    Pain Type Neuropathic pain    Pain Onset More than a month ago                               [facility] Adult PT Treatment/Exercise - 05/06/21 1623       Transfers   Transfers  Lateral/Scoot Transfers    Lateral/Scoot Transfers 5: Supervision;4: Min assist    Lateral/Scoot Transfer Details (indicate cue type and reason) Pt performed transfer with supervision other than to have PT reposition her feet with use of slideboard. PT helped position slideboard as was utilizing clinic board that did not have strap.      Therapeutic Activites    Therapeutic Activities Other Therapeutic Activities    Other Therapeutic Activities Pt performed standing in standing frame: standing working on glut/quad sets x 10,  weight shifting side to side with cues to try to engage gluts and quads. Pt able to get activation on left and trace contraction on right. Added in reaching to side with weight shift x 10. Standing with bilateral scapular retraction with red theraband with PT student stabilizing at pt's shoulders to prevent falling forward x 10. Also discussed other activities she could incorporate when she does standing frame at home. Could use weights for bicep curl and tricep curl if leans forward some. Caregiver observing throughout.  PT Education - 05/06/21 1907     Education Details Discussed activities to incorporate into standing frame time. She has already been working on weight shifting and glut/quad sets. Encouraged her to continue and try to add some UE strengthening in.    Person(s) Educated Patient;Caregiver(s)    Methods Explanation;Demonstration    Comprehension Verbalized understanding              PT Short Term Goals - 04/16/21 1906       PT SHORT TERM GOAL #1   Title Pt will be able to perform initial HEP for stretching and strengthening with caregiver.    Baseline pt reports "we are starting to work on them". 04/16/21 Pt has been limited on performance as trying to get caregiver to help her. New one just started and PT started training on stretching today.    Time 4    Period Weeks    Status On-going    Target Date 04/17/21       PT SHORT TERM GOAL #2   Title Pt will be able to perform slideboard transfer on level surface supervision after no more than min assist to help with board placement.    Baseline Pt required max assist for board placement in wheelchair and on mat -  03-19-21. 04/16/21 supervision for transfer other than to readjust feet. Pt able to place board on own with going left and min assist with going right    Time 4    Period Weeks    Status Achieved    Target Date 04/17/21      PT SHORT TERM GOAL #3   Title Pt will be able to perform rolling to each side supervision for improved bed mobility.    Baseline 04/16/21 rolling left supervision using leg loop to help flex LLE up. Min assist to roll left to get RLE in flexed position    Time 4    Period Weeks    Status Partially Met    Target Date 04/17/21      PT SHORT TERM GOAL #4   Title Pt will be able to perform sit to/from supine transfer mod assist for improved bed mobility.    Baseline needs assist to transfer LE's onto mat for sit to supine; able to hook LUE to pull up to sitting from supine to long sitting    Time 4    Period Weeks    Status Achieved    Target Date 03/20/21      PT SHORT TERM GOAL #5   Title Pt will be able to maintain sitting balance edge of mat with only feet supported x 5 min while performing UE movements for improved sitting balance/trunk stability to assist with sitting ADLs.    Baseline met 03-19-21    Time 4    Period Weeks    Status Achieved    Target Date 03/20/21               PT Long Term Goals - 03/19/21 1925       PT LONG TERM GOAL #1   Title Pt will be able to perform progressive HEP for strengthening, stretching and balance to continue gains on own. (LTGs due 05/15/21)    Time 12    Period Weeks    Status New      PT LONG TERM GOAL #2   Title Pt will be able to perform squat pivot transfer/lateral scoot transfer without slideboard CGA for improved mobility.  Time 12    Period Weeks    Status  New      PT LONG TERM GOAL #3   Title Pt will be able to stand at counter x 2 min min assist for improved standing ability.    Time 12    Period Weeks    Status New      PT LONG TERM GOAL #4   Title Pt will be able to perform all bed mobility CGA for improved function.    Time 12    Period Weeks    Status New      PT LONG TERM GOAL #5   Title Pt will report being able to perform 20 minutes of manual w/c propulsion around home for improved UE strength and mobility. (should be getting manual chair soon)    Time 12    Period Weeks    Status New                   Plan - 05/06/21 1910     Clinical Impression Statement PT focused on activities that pt could incorporate when performing standing frame at home. Pt tolerated well with no pain. She did need some stabilizing at trunk without UE support in this standing frame with less chest support.    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navy officer Evolving/Moderate complexity    Rehab Potential Good    PT Frequency 2x / week   plus eval   PT Duration 12 weeks    PT Treatment/Interventions ADLs/Self Care Home Management;Electrical Stimulation;DME Instruction;Neuromuscular re-education;Manual techniques;Therapeutic exercise;Balance training;Therapeutic activities;Cryotherapy;Moist Heat;Functional mobility training;Stair training;Gait training;Patient/family education;Orthotic Fit/Training;Wheelchair mobility training;Dry needling;Passive range of motion;Vestibular    PT Next Visit Plan Did she get scheduled out? RE-cert due end of next week. Continue SciFit if pt comes in her manual chair? If brings manual chair work on w/c mobility? Slideboard transfer training  working towards more level/uphill transfer, bed mobility. Continue to work on functional strengthening for UE, prone position for scapular strengthening. Continue work on long sit with moving legs on mat, balance with less UE support. Tricep dips using yoga blocks to try to get more lift. Utilizing "C" walk to come from sidelying to long sit.  At some point may try bioness to try to get more quad activation with exercises and possibly with standing frame. Pt does have standing frame at home - have encouraged her to use more daily.    Consulted and Agree with Plan of Care Patient;Family member/caregiver    Family Member Consulted husband, [name]             Patient will benefit from skilled therapeutic intervention in order to improve the following deficits and impairments:  Decreased balance, Decreased mobility, Decreased strength, Impaired sensation, Postural dysfunction, Impaired flexibility, Impaired UE functional use, Impaired tone, Decreased range of motion  Visit Diagnosis: Muscle weakness (generalized)  Quadriplegia, C5-C7 incomplete ([place])     Problem List Patient Active Problem List   Diagnosis Date Noted   Quadriplegia, C5-C7 incomplete ([place]) 01/16/2021   History of spinal fracture 01/16/2021   Suprapubic catheter ([place]) 01/16/2021   Encounter for routine gynecological examination 09/28/2013   Onychomycosis 09/28/2013   Foot deformity, acquired 03/26/2012   Encounter for preventive health examination 12/25/2010   ROSACEA 08/25/2009   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

## 2021-05-06 NOTE — Therapy (Signed)
[facility] [address] [place], [place], [address] Phone: [phone]   Fax:  [phone]  Occupational Therapy Treatment  Patient Details  Name: [name] MRN: [id number] Date of Birth: 03-Mar-1951 Referring Provider (OT): Ina Homes, MD   Encounter Date: 05/06/2021   OT End of Session - 05/06/21 1535     Visit Number 16    Number of Visits 25    Date for OT Re-Evaluation 06/05/21    Authorization Type [employer]    Authorization Time Period Auth Req'd - 16 visits for OT 03/13/21 - 06/08/21    Authorization - Visit Number 71    Authorization - Number of Visits 16    Progress Note Due on Visit 20    OT Start Time 1533    OT Stop Time 1615    OT Time Calculation (min) 42 min    Activity Tolerance Patient tolerated treatment well    Behavior During Therapy [facility] for tasks assessed/performed             Past Medical History:  Diagnosis Date   CERVICAL POLYP 03/11/2008   Qualifier: Diagnosis of  By: Regis Bill MD, Standley Brooking    Colon polyps 2005   on colonscopy Dr. Fuller Plan   Fibroid 2004   Per Dr. Ouida Sills   History of shingles    face and mouth   Hx of skin cancer, basal cell    Rosacea    Sciatica of left side 09/28/2013   Scoliosis    noted on mri done for back pain    Past Surgical History:  Procedure Laterality Date   BUNIONECTOMY      There were no vitals filed for this visit.   Subjective Assessment - 05/06/21 1535     Subjective  "bruce flew to [place] and back"    Patient is accompanied by: Family member   caregiver, [name]   Pertinent History hyperlipidemia, scoliosis, sleep disorder    Patient Stated Goals regain arm strength for rolling over in bed and fine motor coordination for increasing typing    Currently in Pain? Yes    Pain Score 3     Pain Location Arm    Pain Orientation Right;Left    Pain Descriptors / Indicators Pins and needles    Pain Type  Neuropathic pain    Pain Onset More than a month ago    Pain Frequency Constant             Finger isolation and in hand manipulation with thumb LUE. Pt worked on Manufacturing systems engineer out of palm/fingertips with thumb with LUE. Mod difficulty.  Jenga Blocks stacking blocks with BUE, then used fingers to get approx 7 blocks out of tower before falling. Pt with increased skill and ability to use RUE this day.   Finger Isolation  with obtaining checker pieces and flat pieces and pennies out of ice cube tray.                     OT Short Term Goals - 04/16/21 1116       OT SHORT TERM GOAL #1   Title Pt will be independent with HEP w CG assistance PRN    Time 4    Period Weeks    Status On-going    Target Date 04/13/21      OT SHORT TERM GOAL #2   Title Pt will verbalize understanding and report independence  with CG assistance with ues of modalities at home with good safety.    Baseline has paraffin and Estim unit    Time 4    Period Weeks    Status Achieved   pt has new caregiver she is training for paraffin - reviewed and demonstrated understanding of estim unit     OT SHORT TERM GOAL #3   Title Pt will increase Box and Blocks score with RUE to 30 blocks or greater    Baseline R 26 L 39    Time 4    Period Weeks    Status On-going   R 22 blocks  04/16/21     OT SHORT TERM GOAL #4   Title Pt will increase BUE tricep strength to 4/5 consistently for increasing ability to perform SB transfers with supervision/set up assistance only.    Baseline 3+/5 strength BUE    Time 4    Period Weeks    Status Achieved      OT SHORT TERM GOAL #5   Title Pt will increase coordination in LUE to completing 9 hole peg test in 70 seconds or less.    Baseline L 75.13s, R unable    Time 4    Period Weeks    Status Achieved   L 63.87s 04/16/21     OT SHORT TERM GOAL #6   Title Pt will verbalize understanding of adapted strategies and/or equipment for  increasing indepenence with ADLs and IADLs (typing, bathing, cutting food, etc)    Baseline has U cuff    Time 4    Period Weeks    Status On-going   has verbalized understanding of a lot of AE but continues to benefit from education              OT Long Term Goals - 03/16/21 0943       OT LONG TERM GOAL #1   Title Pt will be independence with any updated HEP    Time 12    Period Weeks    Status New    Target Date 06/08/21      OT LONG TERM GOAL #2   Title Pt will increase functional use of BUE evidenced by completing Box and Blocks with score of 35 or greater with RUE, 45 or greater with LUE.    Baseline R 26, L 39    Time 12    Period Weeks    Status New      OT LONG TERM GOAL #3   Title PT will improve grip strength in BUE by increasing grip strength to 10 lbs or greater with RUE and 20 lbs or greater with LUE.    Baseline R 1.5, L 12.3    Time 12    Period Weeks    Status New      OT LONG TERM GOAL #4   Title Pt will improve isolated finger movements in order to increase skill towards simple typing with adapted strategies and equipment PRN.    Baseline isolated movement in LUE    Time 12    Period Weeks    Status New      OT LONG TERM GOAL #5   Title Pt will improve 9 hole peg test in LUE to completing in 65 seconds or less in order to increase functional use and demonstrate ability to place 2 or more pegs with RUE.    Baseline L 75.13s, R unable    Time 12  Period Weeks    Status New      OT LONG TERM GOAL #6   Title Pt will report completing UB and LB dressing with decreased assistance consistently.    Baseline UB (reports able to do but not consistently doing), LB total A    Time 12    Period Weeks    Status New                   Plan - 05/06/21 1621     Clinical Impression Statement Pt continues to progress with RUE and LUE coordination and strength. continue to progress towards goals.    OT Occupational Profile and History Detailed  Assessment- Review of Records and additional review of physical, cognitive, psychosocial history related to current functional performance    Occupational performance deficits (Please refer to evaluation for details): ADL's;IADL's;Leisure;Work;Rest and Sleep    Body Structure / Function / Physical Skills ADL;IADL;ROM;Strength;Decreased knowledge of use of DME;Dexterity;GMC;Pain;Tone;UE functional use;Body mechanics;Balance;Continence;FMC;Muscle spasms;Skin integrity;Flexibility;Mobility;Sensation;Improper spinal/pelvic alignment;Endurance    Rehab Potential Good    Clinical Decision Making Several treatment options, min-mod task modification necessary    Comorbidities Affecting Occupational Performance: May have comorbidities impacting occupational performance    Modification or Assistance to Complete Evaluation  Min-Moderate modification of tasks or assist with assess necessary to complete eval    OT Frequency 2x / week    OT Duration 12 weeks    OT Treatment/Interventions Self-care/ADL training;Moist Heat;Fluidtherapy;DME and/or AE instruction;Splinting;Therapeutic activities;Aquatic Therapy;Ultrasound;Therapeutic exercise;Cognitive remediation/compensation;Passive range of motion;Functional Mobility Training;Neuromuscular education;Electrical Stimulation;Paraffin;Manual Therapy;Patient/family education    Plan mat work with progressing towards LB Dressing, RUE and LUE coordination (RUE< LUE)    Consulted and Agree with Plan of Care Patient;Family member/caregiver    Family Member Consulted spouse [name]             Patient will benefit from skilled therapeutic intervention in order to improve the following deficits and impairments:   Body Structure / Function / Physical Skills: ADL, IADL, ROM, Strength, Decreased knowledge of use of DME, Dexterity, GMC, Pain, Tone, UE functional use, Body mechanics, Balance, Continence, FMC, Muscle spasms, Skin integrity, Flexibility, Mobility, Sensation,  Improper spinal/pelvic alignment, Endurance       Visit Diagnosis: Muscle weakness (generalized)  Quadriplegia, C5-C7 incomplete ([facility])  Other symptoms and signs involving the nervous system  Other lack of coordination  Other disturbances of skin sensation    Problem List Patient Active Problem List   Diagnosis Date Noted   Quadriplegia, C5-C7 incomplete ([place]) 01/16/2021   History of spinal fracture 01/16/2021   Suprapubic catheter ([place]) 01/16/2021   Encounter for routine gynecological examination 09/28/2013   Onychomycosis 09/28/2013   Foot deformity, acquired 03/26/2012   Encounter for preventive health examination 12/25/2010   ROSACEA 08/25/2009   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

## 2021-05-12 ENCOUNTER — Ambulatory Visit: Payer: Medicare PPO | Admitting: Occupational Therapy

## 2021-05-12 ENCOUNTER — Encounter: Payer: Self-pay | Admitting: Occupational Therapy

## 2021-05-12 ENCOUNTER — Other Ambulatory Visit: Payer: Self-pay

## 2021-05-12 ENCOUNTER — Ambulatory Visit: Payer: Medicare PPO | Admitting: Podiatry

## 2021-05-12 ENCOUNTER — Ambulatory Visit: Payer: Medicare PPO

## 2021-05-12 DIAGNOSIS — R2689 Other abnormalities of gait and mobility: Secondary | ICD-10-CM | POA: Diagnosis not present

## 2021-05-12 DIAGNOSIS — M6281 Muscle weakness (generalized): Secondary | ICD-10-CM | POA: Diagnosis not present

## 2021-05-12 DIAGNOSIS — G8254 Quadriplegia, C5-C7 incomplete: Secondary | ICD-10-CM

## 2021-05-12 DIAGNOSIS — L89890 Pressure ulcer of other site, unstageable: Secondary | ICD-10-CM | POA: Diagnosis not present

## 2021-05-12 DIAGNOSIS — R29818 Other symptoms and signs involving the nervous system: Secondary | ICD-10-CM

## 2021-05-12 DIAGNOSIS — G8253 Quadriplegia, C5-C7 complete: Secondary | ICD-10-CM | POA: Diagnosis not present

## 2021-05-12 DIAGNOSIS — R208 Other disturbances of skin sensation: Secondary | ICD-10-CM | POA: Diagnosis not present

## 2021-05-12 DIAGNOSIS — R278 Other lack of coordination: Secondary | ICD-10-CM | POA: Diagnosis not present

## 2021-05-12 DIAGNOSIS — R293 Abnormal posture: Secondary | ICD-10-CM | POA: Diagnosis not present

## 2021-05-12 NOTE — Therapy (Signed)
[facility] [address] [place], [place], [address] Phone: [phone]   Fax:  [phone]  Physical Therapy Treatment  Patient Details  Name: [name] MRN: [id number] Date of Birth: 1950-08-23 Referring Provider (PT): Landis Gandy   Encounter Date: 05/12/2021   PT End of Session - 05/12/21 1727     Visit Number 24    Number of Visits 25    Date for PT Re-Evaluation 05/15/21    Authorization Type [employer] 25 visits 9/8-12/2/22    Authorization - Visit Number 24    Authorization - Number of Visits 25    Progress Note Due on Visit 20    PT Start Time 2423    PT Stop Time 1530    PT Time Calculation (min) 45 min    Equipment Utilized During Treatment Other (comment)   slideboard   Activity Tolerance Patient tolerated treatment well    Behavior During Therapy [facility] for tasks assessed/performed             Past Medical History:  Diagnosis Date   CERVICAL POLYP 03/11/2008   Qualifier: Diagnosis of  By: Regis Bill MD, Standley Brooking    Colon polyps 2005   on colonscopy Dr. Fuller Plan   Fibroid 2004   Per Dr. Ouida Sills   History of shingles    face and mouth   Hx of skin cancer, basal cell    Rosacea    Sciatica of left side 09/28/2013   Scoliosis    noted on mri done for back pain    Past Surgical History:  Procedure Laterality Date   BUNIONECTOMY      There were no vitals filed for this visit.   Subjective Assessment - 05/12/21 1448     Subjective Pt reports she has been taken off of cymbalta and reports not being able to sleep due to it. Pt reports her clonus has been happening more often. Performing standing frame more often. Pt reports increased lower arm pain.    Currently in Pain? Yes    Pain Score 5     Pain Location Arm    Pain Orientation Right;Left;Lower    Pain Type Chronic pain    Pain Onset More than a month ago                               [facility] Adult  PT Treatment/Exercise - 05/12/21 1729       Bed Mobility   Bed Mobility Rolling Right    Rolling Right Independent with assistive device   Pt was able to bring leg up using gait belt as loop and only required verbal cueing to confirm foot was bent enough. Used arms to rock for [employer].   Rolling Left --    Right Sidelying to Sit Moderate Assistance - Patient 50-74%   Pt was able to     Transfers   Transfers Lateral/Scoot Transfers    Lateral/Scoot Transfers 5: Supervision;4: Min assist    Lateral/Scoot Transfer Details (indicate cue type and reason) Pt performed transfer with supervision other than to have PT reposition her feet with use of slideboard.      Therapeutic Activites    Therapeutic Activities Other Therapeutic Activities    Other Therapeutic Activities Pt performed sitting from edge of mat to supine with use of leg loop to try and independently bring legs on mat. Pt was able to use momentum to  bring LLE back independently but required assistance to bring RLE on mat. Pt required 2 person min assist to bring elbow under her to go from supine to sitting to try a second sit to supine. Pt hooked LLE behind RLE and used momentum but was not able to get legs up w/o asstiance. Pt performed 2 sets of supine to sit transfers w/ focus on obtaining C shape and being able to get elbow under herself independently w/ use of single leg loop. Pt required no assistance on either roll but required min assist to get elbow under her. Utilized leg loop to help unweight right arm to bring under her. PT cued pt to keep forward and to be able to slide arm on table. PT provided min guard under RUE.      Exercises   Exercises Other Exercises    Other Exercises  Long sit self hamstring stretch 30 sec x 3 then PT student adding in overpressure for increased stretch x 30 sec.                       PT Short Term Goals - 04/16/21 1906       PT SHORT TERM GOAL #1   Title Pt will be able to  perform initial HEP for stretching and strengthening with caregiver.    Baseline pt reports "we are starting to work on them". 04/16/21 Pt has been limited on performance as trying to get caregiver to help her. New one just started and PT started training on stretching today.    Time 4    Period Weeks    Status On-going    Target Date 04/17/21      PT SHORT TERM GOAL #2   Title Pt will be able to perform slideboard transfer on level surface supervision after no more than min assist to help with board placement.    Baseline Pt required max assist for board placement in wheelchair and on mat -  03-19-21. 04/16/21 supervision for transfer other than to readjust feet. Pt able to place board on own with going left and min assist with going right    Time 4    Period Weeks    Status Achieved    Target Date 04/17/21      PT SHORT TERM GOAL #3   Title Pt will be able to perform rolling to each side supervision for improved bed mobility.    Baseline 04/16/21 rolling left supervision using leg loop to help flex LLE up. Min assist to roll left to get RLE in flexed position    Time 4    Period Weeks    Status Partially Met    Target Date 04/17/21      PT SHORT TERM GOAL #4   Title Pt will be able to perform sit to/from supine transfer mod assist for improved bed mobility.    Baseline needs assist to transfer LE's onto mat for sit to supine; able to hook LUE to pull up to sitting from supine to long sitting    Time 4    Period Weeks    Status Achieved    Target Date 03/20/21      PT SHORT TERM GOAL #5   Title Pt will be able to maintain sitting balance edge of mat with only feet supported x 5 min while performing UE movements for improved sitting balance/trunk stability to assist with sitting ADLs.    Baseline met 03-19-21  Time 4    Period Weeks    Status Achieved    Target Date 03/20/21               PT Long Term Goals - 03/19/21 1925       PT LONG TERM GOAL #1   Title Pt will be  able to perform progressive HEP for strengthening, stretching and balance to continue gains on own. (LTGs due 05/15/21)    Time 12    Period Weeks    Status New      PT LONG TERM GOAL #2   Title Pt will be able to perform squat pivot transfer/lateral scoot transfer without slideboard CGA for improved mobility.    Time 12    Period Weeks    Status New      PT LONG TERM GOAL #3   Title Pt will be able to stand at counter x 2 min min assist for improved standing ability.    Time 12    Period Weeks    Status New      PT LONG TERM GOAL #4   Title Pt will be able to perform all bed mobility CGA for improved function.    Time 12    Period Weeks    Status New      PT LONG TERM GOAL #5   Title Pt will report being able to perform 20 minutes of manual w/c propulsion around home for improved UE strength and mobility. (should be getting manual chair soon)    Time 12    Period Weeks    Status New                   Plan - 05/12/21 1740     Clinical Impression Statement Today's PT session on improving bed mobility and supine to sit transfers. Pt did a good job of using momentum and a leg loop to bring her LLE up onto the mat independently but pt still struggles in moving RLE. Pt still requires min assistance to get RUE under her to be able to attain C-shape and min assist to scoot. PT will continue to progress pt as tolerated w/ activities towards achieving LTG.    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[employer] officer Evolving/Moderate complexity    Rehab Potential Good    PT Frequency 2x / week   plus eval   PT Duration 12 weeks    PT Treatment/Interventions ADLs/Self Care Home Management;Electrical Stimulation;DME  Instruction;Neuromuscular re-education;Manual techniques;Therapeutic exercise;Balance training;Therapeutic activities;Cryotherapy;Moist Heat;Functional mobility training;Stair training;Gait training;Patient/family education;Orthotic Fit/Training;Wheelchair mobility training;Dry needling;Passive range of motion;Vestibular    PT Next Visit Plan Recert next visit. Continue SciFit if pt comes in her manual chair? If brings manual chair work on w/c mobility? Slideboard transfer training working towards more level/uphill transfer, bed mobility. Continue to work on functional strengthening for UE, prone position for scapular strengthening. Continue work on long sit with moving legs on mat, balance with less UE support. Tricep dips using yoga blocks to try to get more lift. Utilizing "C" walk to come from sidelying to long sit.  At some point may try bioness to try to get more quad activation with exercises and possibly with standing frame. Pt does have standing frame at home - have encouraged her to use more daily.    Consulted  and Agree with Plan of Care Patient;Family member/caregiver    Family Member Consulted husband, [name]             Patient will benefit from skilled therapeutic intervention in order to improve the following deficits and impairments:  Decreased balance, Decreased mobility, Decreased strength, Impaired sensation, Postural dysfunction, Impaired flexibility, Impaired UE functional use, Impaired tone, Decreased range of motion  Visit Diagnosis: Muscle weakness (generalized)  Quadriplegia, C5-C7 complete (HCC)  Other abnormalities of gait and mobility     Problem List Patient Active Problem List   Diagnosis Date Noted   Quadriplegia, C5-C7 incomplete ([place]) 01/16/2021   History of spinal fracture 01/16/2021   Suprapubic catheter ([place]) 01/16/2021   Encounter for routine gynecological examination 09/28/2013   Onychomycosis 09/28/2013   Foot deformity, acquired 03/26/2012    Encounter for preventive health examination 12/25/2010   ROSACEA 08/25/2009   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 [place], [place], [address] Phone: [phone]   Fax:  [phone]  Name: [name] MRN: [id number] Date of Birth: 08/21/1950

## 2021-05-12 NOTE — Therapy (Signed)
[facility] [address] [place], [place], [address] Phone: [phone]   Fax:  [phone]  Occupational Therapy Treatment  Patient Details  Name: [name] MRN: [id number] Date of Birth: 12-16-1950 Referring Provider (OT): Ina Homes, MD   Encounter Date: 05/12/2021   OT End of Session - 05/12/21 1535     Visit Number 17    Number of Visits 25    Date for OT Re-Evaluation 06/05/21    Authorization Type [employer]    Authorization Time Period Auth Req'd - 16 visits for OT 03/13/21 - 06/08/21 - got verbal approval for seeing on 11/29 - appt notes not updated.    Authorization - Visit Number 17    Authorization - Number of Visits 16    Progress Note Due on Visit 20    OT Start Time 1533    OT Stop Time 1615    OT Time Calculation (min) 42 min    Activity Tolerance Patient tolerated treatment well    Behavior During Therapy [facility] for tasks assessed/performed             Past Medical History:  Diagnosis Date   CERVICAL POLYP 03/11/2008   Qualifier: Diagnosis of  By: Regis Bill MD, Standley Brooking    Colon polyps 2005   on colonscopy Dr. Fuller Plan   Fibroid 2004   Per Dr. Ouida Sills   History of shingles    face and mouth   Hx of skin cancer, basal cell    Rosacea    Sciatica of left side 09/28/2013   Scoliosis    noted on mri done for back pain    Past Surgical History:  Procedure Laterality Date   BUNIONECTOMY      There were no vitals filed for this visit.   Subjective Assessment - 05/12/21 1534     Subjective  "i'm getting tighter and tighter and i'm getting more and more worried"    Patient is accompanied by: Family member   caregiver, [name]   Pertinent History hyperlipidemia, scoliosis, sleep disorder    Patient Stated Goals regain arm strength for rolling over in bed and fine motor coordination for increasing typing    Currently in Pain? Yes    Pain Score 5     Pain Location  Arm    Pain Orientation Right;Left;Lower    Pain Descriptors / Indicators Pins and needles;Tightness    Pain Type Chronic pain    Pain Onset More than a month ago    Pain Frequency Constant                          OT Treatments/Exercises (OP) - 05/12/21 1548       ADLs   Cooking pt reports sauteeing mushrooms over the weekend with caregiver      Neurological Re-education Exercises   Other Exercises 1 discussed BUE increased tightness in hands. Pt reports hands getting more tight and sitting with increased flexion in digits. Pt is able to passively stretch to extension so will continue to monitor.      Fine Motor Coordination (Hand/Wrist)   Fine Motor Coordination Grooved pegs;Manipulation of small objects    Manipulation of small objects stringing beads with BUE - min difficulty    Grooved pegs with LUE with increased difficulty d/t fatigue and tightness today. Pt completed with mod drops today and difficulty  OT Short Term Goals - 04/16/21 1116       OT SHORT TERM GOAL #1   Title Pt will be independent with HEP w CG assistance PRN    Time 4    Period Weeks    Status On-going    Target Date 04/13/21      OT SHORT TERM GOAL #2   Title Pt will verbalize understanding and report independence with CG assistance with ues of modalities at home with good safety.    Baseline has paraffin and Estim unit    Time 4    Period Weeks    Status Achieved   pt has new caregiver she is training for paraffin - reviewed and demonstrated understanding of estim unit     OT SHORT TERM GOAL #3   Title Pt will increase Box and Blocks score with RUE to 30 blocks or greater    Baseline R 26 L 39    Time 4    Period Weeks    Status On-going   R 22 blocks  04/16/21     OT SHORT TERM GOAL #4   Title Pt will increase BUE tricep strength to 4/5 consistently for increasing ability to perform SB transfers with supervision/set up assistance only.     Baseline 3+/5 strength BUE    Time 4    Period Weeks    Status Achieved      OT SHORT TERM GOAL #5   Title Pt will increase coordination in LUE to completing 9 hole peg test in 70 seconds or less.    Baseline L 75.13s, R unable    Time 4    Period Weeks    Status Achieved   L 63.87s 04/16/21     OT SHORT TERM GOAL #6   Title Pt will verbalize understanding of adapted strategies and/or equipment for increasing indepenence with ADLs and IADLs (typing, bathing, cutting food, etc)    Baseline has U cuff    Time 4    Period Weeks    Status On-going   has verbalized understanding of a lot of AE but continues to benefit from education              OT Long Term Goals - 03/16/21 0943       OT LONG TERM GOAL #1   Title Pt will be independence with any updated HEP    Time 12    Period Weeks    Status New    Target Date 06/08/21      OT LONG TERM GOAL #2   Title Pt will increase functional use of BUE evidenced by completing Box and Blocks with score of 35 or greater with RUE, 45 or greater with LUE.    Baseline R 26, L 39    Time 12    Period Weeks    Status New      OT LONG TERM GOAL #3   Title PT will improve grip strength in BUE by increasing grip strength to 10 lbs or greater with RUE and 20 lbs or greater with LUE.    Baseline R 1.5, L 12.3    Time 12    Period Weeks    Status New      OT LONG TERM GOAL #4   Title Pt will improve isolated finger movements in order to increase skill towards simple typing with adapted strategies and equipment PRN.    Baseline isolated movement in LUE    Time 12  Period Weeks    Status New      OT LONG TERM GOAL #5   Title Pt will improve 9 hole peg test in LUE to completing in 65 seconds or less in order to increase functional use and demonstrate ability to place 2 or more pegs with RUE.    Baseline L 75.13s, R unable    Time 12    Period Weeks    Status New      OT LONG TERM GOAL #6   Title Pt will report completing UB and LB  dressing with decreased assistance consistently.    Baseline UB (reports able to do but not consistently doing), LB total A    Time 12    Period Weeks    Status New                   Plan - 05/12/21 1628     Clinical Impression Statement Pt continues to be concerned about tightness in BUE hands. Will continue to monitor but at this time patient is able to achieve full PROM for BUE hands.    OT Occupational Profile and History Detailed Assessment- Review of Records and additional review of physical, cognitive, psychosocial history related to current functional performance    Occupational performance deficits (Please refer to evaluation for details): ADL's;IADL's;Leisure;Work;Rest and Sleep    Body Structure / Function / Physical Skills ADL;IADL;ROM;Strength;Decreased knowledge of use of DME;Dexterity;GMC;Pain;Tone;UE functional use;Body mechanics;Balance;Continence;FMC;Muscle spasms;Skin integrity;Flexibility;Mobility;Sensation;Improper spinal/pelvic alignment;Endurance    Rehab Potential Good    Clinical Decision Making Several treatment options, min-mod task modification necessary    Comorbidities Affecting Occupational Performance: May have comorbidities impacting occupational performance    Modification or Assistance to Complete Evaluation  Min-Moderate modification of tasks or assist with assess necessary to complete eval    OT Frequency 2x / week    OT Duration 12 weeks    OT Treatment/Interventions Self-care/ADL training;Moist Heat;Fluidtherapy;DME and/or AE instruction;Splinting;Therapeutic activities;Aquatic Therapy;Ultrasound;Therapeutic exercise;Cognitive remediation/compensation;Passive range of motion;Functional Mobility Training;Neuromuscular education;Electrical Stimulation;Paraffin;Manual Therapy;Patient/family education    Plan mat work with progressing towards LB Dressing, RUE and LUE coordination (RUE< LUE)    Consulted and Agree with Plan of Care Patient;Family  member/caregiver    Family Member Consulted spouse [name]             Patient will benefit from skilled therapeutic intervention in order to improve the following deficits and impairments:   Body Structure / Function / Physical Skills: ADL, IADL, ROM, Strength, Decreased knowledge of use of DME, Dexterity, GMC, Pain, Tone, UE functional use, Body mechanics, Balance, Continence, FMC, Muscle spasms, Skin integrity, Flexibility, Mobility, Sensation, Improper spinal/pelvic alignment, Endurance       Visit Diagnosis: Muscle weakness (generalized)  Other symptoms and signs involving the nervous system  Quadriplegia, C5-C7 incomplete (HCC)  Other lack of coordination  Other disturbances of skin sensation    Problem List Patient Active Problem List   Diagnosis Date Noted   Quadriplegia, C5-C7 incomplete ([place]) 01/16/2021   History of spinal fracture 01/16/2021   Suprapubic catheter ([place]) 01/16/2021   Encounter for routine gynecological examination 09/28/2013   Onychomycosis 09/28/2013   Foot deformity, acquired 03/26/2012   Encounter for preventive health examination 12/25/2010   ROSACEA 08/25/2009   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 [place], [place], [address] Phone: [phone]   Fax:  [phone]  Name: Allyssa Abruzzese MRN: [id number] Date of Birth: 30-Oct-1950

## 2021-05-12 NOTE — Progress Notes (Signed)
  Subjective:  Patient ID: [name], female    DOB: Sep 07, 1950,  MRN: [id number]  Chief Complaint  Patient presents with   Foot Ulcer      6 week wound care   Hammer Toe    70 y.o. female presents with the above complaint. History confirmed with patient.  Continues to improve but the wounds are still present she is still wearing the open ended compression stockings  Objective:  Physical Exam: The feet have significant +2 to +3 pitting edema, cyanosis and pallor with palpation although her dorsalis pedis pulses still weakly palpable.  Right side lateral fifth met ulcer has healed with minor hyperkeratosis.  Bilateral fifth PIPJ dorsal lateral corns have blood blisters today fifth MTPJ ulcerations have now healed.     ABIs reviewed good flow with excellent waveforms Assessment:   1. Pressure injury of toe, unstageable, unspecified laterality ([place])       Plan:  Patient was evaluated and treated and all questions answered.  Only the DIPJ ulcerations now remain.  I debrided the overlying hyperkeratosis today and there are small blood blisters here.  I recommend they continue mupirocin ointment.  Reviewed the compression stockings she wears that has pressure over this and I think this is likely negatively impacting wound healing here.  I recommend she follow them back so they do not cause pressure on the toes.  She is concerned this will worsen her swelling in her feet.  While this may be true I think the benefit of not having a negative impact on the wound will be in acceptable trade off.  I also dispensed her silicone toe pads from there is no more drainage she can try these to see if this offloads pressure as well.  I will see her back in 6 weeks continue offloading  Return in about 6 weeks (around 06/23/2021) for wound care.

## 2021-05-13 DIAGNOSIS — N312 Flaccid neuropathic bladder, not elsewhere classified: Secondary | ICD-10-CM | POA: Diagnosis not present

## 2021-05-14 ENCOUNTER — Other Ambulatory Visit: Payer: Self-pay

## 2021-05-14 ENCOUNTER — Ambulatory Visit: Payer: Medicare PPO | Admitting: Occupational Therapy

## 2021-05-14 ENCOUNTER — Ambulatory Visit: Payer: Medicare PPO | Attending: Internal Medicine

## 2021-05-14 DIAGNOSIS — R29818 Other symptoms and signs involving the nervous system: Secondary | ICD-10-CM | POA: Insufficient documentation

## 2021-05-14 DIAGNOSIS — R278 Other lack of coordination: Secondary | ICD-10-CM | POA: Insufficient documentation

## 2021-05-14 DIAGNOSIS — R208 Other disturbances of skin sensation: Secondary | ICD-10-CM | POA: Insufficient documentation

## 2021-05-14 DIAGNOSIS — R2689 Other abnormalities of gait and mobility: Secondary | ICD-10-CM | POA: Diagnosis not present

## 2021-05-14 DIAGNOSIS — M6281 Muscle weakness (generalized): Secondary | ICD-10-CM | POA: Diagnosis not present

## 2021-05-14 DIAGNOSIS — G8254 Quadriplegia, C5-C7 incomplete: Secondary | ICD-10-CM | POA: Diagnosis not present

## 2021-05-14 DIAGNOSIS — R293 Abnormal posture: Secondary | ICD-10-CM | POA: Insufficient documentation

## 2021-05-14 NOTE — Therapy (Signed)
[facility] [address] [place], [place], [address] Phone: [phone]   Fax:  [phone]  Occupational Therapy Treatment  Patient Details  Name: [name] MRN: [id number] Date of Birth: Nov 06, 1950 Referring Provider (OT): Ina Homes, MD   Encounter Date: 05/14/2021   OT End of Session - 05/14/21 1156     Visit Number 18    Number of Visits 25    Date for OT Re-Evaluation 06/05/21    Authorization Type [employer]    Authorization Time Period Auth Req'd - 25 visits 05/06/21 - 07/19/21    Authorization - Visit Number 2    Authorization - Number of Visits 25   appt notes have not been updated however verbal approval for seeing patient that Josem Kaufmann has been approved   Progress Note Due on Visit 43    OT Start Time 1152   arrival from PT   OT Stop Time 1230    OT Time Calculation (min) 38 min    Activity Tolerance Patient tolerated treatment well    Behavior During Therapy [facility] for tasks assessed/performed             Past Medical History:  Diagnosis Date   CERVICAL POLYP 03/11/2008   Qualifier: Diagnosis of  By: Regis Bill MD, Standley Brooking    Colon polyps 2005   on colonscopy Dr. Fuller Plan   Fibroid 2004   Per Dr. Ouida Sills   History of shingles    face and mouth   Hx of skin cancer, basal cell    Rosacea    Sciatica of left side 09/28/2013   Scoliosis    noted on mri done for back pain    Past Surgical History:  Procedure Laterality Date   BUNIONECTOMY      There were no vitals filed for this visit.   Subjective Assessment - 05/14/21 1210     Subjective  Pt reports concern with RUE cramping up and function declining.    Patient is accompanied by: Family member   caregiver, [name]   Pertinent History hyperlipidemia, scoliosis, sleep disorder    Patient Stated Goals regain arm strength for rolling over in bed and fine motor coordination for increasing typing    Currently in Pain? Yes     Pain Score 4     Pain Location Arm    Pain Orientation Right;Left;Lower    Pain Descriptors / Indicators Aching;Throbbing    Pain Type Chronic pain;Neuropathic pain    Pain Onset More than a month ago    Pain Frequency Constant                [facility] OT Assessment - 05/14/21 0001       Coordination   Box and Blocks R 5 (index finger cramps up and does not allow extension to pick up block) L 35                      OT Treatments/Exercises (OP) - 05/14/21 1315       Shoulder Exercises: Seated   Other Seated Exercises x 12 reps yellow theraband bicep curls, tricep pull downs, horizontal abduction and second set with red theraband with OT holding anchor.      Fine Motor Coordination (Hand/Wrist)   Fine Motor Coordination Manipulation of small objects    Manipulation of small objects assessed box and blocks. see score. pt with significant decline with RUE d/t hand cramping and increased tone increasing index  finger flexion and difficulty with extension for grasp.                      OT Short Term Goals - 04/16/21 1116       OT SHORT TERM GOAL #1   Title Pt will be independent with HEP w CG assistance PRN    Time 4    Period Weeks    Status On-going    Target Date 04/13/21      OT SHORT TERM GOAL #2   Title Pt will verbalize understanding and report independence with CG assistance with ues of modalities at home with good safety.    Baseline has paraffin and Estim unit    Time 4    Period Weeks    Status Achieved   pt has new caregiver she is training for paraffin - reviewed and demonstrated understanding of estim unit     OT SHORT TERM GOAL #3   Title Pt will increase Box and Blocks score with RUE to 30 blocks or greater    Baseline R 26 L 39    Time 4    Period Weeks    Status On-going   R 22 blocks  04/16/21     OT SHORT TERM GOAL #4   Title Pt will increase BUE tricep strength to 4/5 consistently for increasing ability to perform SB  transfers with supervision/set up assistance only.    Baseline 3+/5 strength BUE    Time 4    Period Weeks    Status Achieved      OT SHORT TERM GOAL #5   Title Pt will increase coordination in LUE to completing 9 hole peg test in 70 seconds or less.    Baseline L 75.13s, R unable    Time 4    Period Weeks    Status Achieved   L 63.87s 04/16/21     OT SHORT TERM GOAL #6   Title Pt will verbalize understanding of adapted strategies and/or equipment for increasing indepenence with ADLs and IADLs (typing, bathing, cutting food, etc)    Baseline has U cuff    Time 4    Period Weeks    Status On-going   has verbalized understanding of a lot of AE but continues to benefit from education              OT Long Term Goals - 03/16/21 0943       OT LONG TERM GOAL #1   Title Pt will be independence with any updated HEP    Time 12    Period Weeks    Status New    Target Date 06/08/21      OT LONG TERM GOAL #2   Title Pt will increase functional use of BUE evidenced by completing Box and Blocks with score of 35 or greater with RUE, 45 or greater with LUE.    Baseline R 26, L 39    Time 12    Period Weeks    Status New      OT LONG TERM GOAL #3   Title PT will improve grip strength in BUE by increasing grip strength to 10 lbs or greater with RUE and 20 lbs or greater with LUE.    Baseline R 1.5, L 12.3    Time 12    Period Weeks    Status New      OT LONG TERM GOAL #4   Title Pt will improve isolated  finger movements in order to increase skill towards simple typing with adapted strategies and equipment PRN.    Baseline isolated movement in LUE    Time 12    Period Weeks    Status New      OT LONG TERM GOAL #5   Title Pt will improve 9 hole peg test in LUE to completing in 65 seconds or less in order to increase functional use and demonstrate ability to place 2 or more pegs with RUE.    Baseline L 75.13s, R unable    Time 12    Period Weeks    Status New      OT LONG  TERM GOAL #6   Title Pt will report completing UB and LB dressing with decreased assistance consistently.    Baseline UB (reports able to do but not consistently doing), LB total A    Time 12    Period Weeks    Status New                   Plan - 05/14/21 1317     Clinical Impression Statement Pt demonstrating functioanl decline with RUE d/t increased tone and finger flexion impeding ability to grasp and release. Continue to monitor. Pt continues to be able to achieve full PROM in BUE.    OT Occupational Profile and History Detailed Assessment- Review of Records and additional review of physical, cognitive, psychosocial history related to current functional performance    Occupational performance deficits (Please refer to evaluation for details): ADL's;IADL's;Leisure;Work;Rest and Sleep    Body Structure / Function / Physical Skills ADL;IADL;ROM;Strength;Decreased knowledge of use of DME;Dexterity;GMC;Pain;Tone;UE functional use;Body mechanics;Balance;Continence;FMC;Muscle spasms;Skin integrity;Flexibility;Mobility;Sensation;Improper spinal/pelvic alignment;Endurance    Rehab Potential Good    Clinical Decision Making Several treatment options, min-mod task modification necessary    Comorbidities Affecting Occupational Performance: May have comorbidities impacting occupational performance    Modification or Assistance to Complete Evaluation  Min-Moderate modification of tasks or assist with assess necessary to complete eval    OT Frequency 2x / week    OT Duration 12 weeks    OT Treatment/Interventions Self-care/ADL training;Moist Heat;Fluidtherapy;DME and/or AE instruction;Splinting;Therapeutic activities;Aquatic Therapy;Ultrasound;Therapeutic exercise;Cognitive remediation/compensation;Passive range of motion;Functional Mobility Training;Neuromuscular education;Electrical Stimulation;Paraffin;Manual Therapy;Patient/family education    Plan mat work with progressing towards LB  Dressing, RUE and LUE coordination (RUE< LUE)    Consulted and Agree with Plan of Care Patient;Family member/caregiver    Family Member Consulted spouse [name]             Patient will benefit from skilled therapeutic intervention in order to improve the following deficits and impairments:   Body Structure / Function / Physical Skills: ADL, IADL, ROM, Strength, Decreased knowledge of use of DME, Dexterity, GMC, Pain, Tone, UE functional use, Body mechanics, Balance, Continence, FMC, Muscle spasms, Skin integrity, Flexibility, Mobility, Sensation, Improper spinal/pelvic alignment, Endurance       Visit Diagnosis: Muscle weakness (generalized)  Other symptoms and signs involving the nervous system  Quadriplegia, C5-C7 incomplete (HCC)  Other abnormalities of gait and mobility  Other lack of coordination    Problem List Patient Active Problem List   Diagnosis Date Noted   Quadriplegia, C5-C7 incomplete ([place]) 01/16/2021   History of spinal fracture 01/16/2021   Suprapubic catheter ([place]) 01/16/2021   Encounter for routine gynecological examination 09/28/2013   Onychomycosis 09/28/2013   Foot deformity, acquired 03/26/2012   Encounter for preventive health examination 12/25/2010   ROSACEA 08/25/2009   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

## 2021-05-14 NOTE — Therapy (Signed)
[facility] [address] [place], [place], [address] Phone: [phone]   Fax:  [phone]  Physical Therapy Treatment/Recertification  Patient Details  Name: [name] MRN: [id number] Date of Birth: Jul 17, 1950 Referring Provider (PT): Landis Gandy   Encounter Date: 05/14/2021   PT End of Session - 05/14/21 1220     Visit Number 25    Number of Visits 83    Date for PT Re-Evaluation 08/07/21    Authorization Type [employer] 25 visits 9/8-12/2/22, new authorization has been requested    Authorization - Visit Number 25    Authorization - Number of Visits 25    Progress Note Due on Visit 20    PT Start Time 1104    PT Stop Time 1149    PT Time Calculation (min) 45 min    Equipment Utilized During Treatment Other (comment)   slideboard   Activity Tolerance Patient tolerated treatment well    Behavior During Therapy [facility] for tasks assessed/performed             Past Medical History:  Diagnosis Date   CERVICAL POLYP 03/11/2008   Qualifier: Diagnosis of  By: Regis Bill MD, Standley Brooking    Colon polyps 2005   on colonscopy Dr. Fuller Plan   Fibroid 2004   Per Dr. Ouida Sills   History of shingles    face and mouth   Hx of skin cancer, basal cell    Rosacea    Sciatica of left side 09/28/2013   Scoliosis    noted on mri done for back pain    Past Surgical History:  Procedure Laterality Date   BUNIONECTOMY      There were no vitals filed for this visit.   Subjective Assessment - 05/14/21 1105     Subjective Pt reports no changes since the last time. Pt reports she has slept well the past two nights possibly due to the medication changes.    Currently in Pain? Yes    Pain Score 4     Pain Location Arm    Pain Orientation Upper;Left    Pain Descriptors / Indicators Throbbing;Aching    Pain Onset More than a month ago                               [facility] Adult PT  Treatment/Exercise - 05/14/21 1223       Bed Mobility   Bed Mobility Rolling Right    Rolling Right Independent with assistive device;Minimal Assistance - Patient > 75%;Supervision/verbal cueing   Pt only required leg loop to be able to bring LLE up and verbal cueing to confirm leg was high enough. Used momentum from arms to roll.   Rolling Left Supervision/Verbal cueing;Minimal Assistance - Patient > 75%   Pt was able to roll to the left indpendently after PT assist to bring RLE up. Pt used momentum from arms to roll.   Right Sidelying to Sit Moderate Assistance - Patient 50-74%      Transfers   Transfers Lateral/Scoot Transfers    Lateral/Scoot Transfers 5: Supervision;4: Min assist    Lateral/Scoot Transfer Details (indicate cue type and reason) Pt was able to perform transfer supervision other than to have PT reposition her feet w/ use of slideboard.      Therapeutic Activites    Therapeutic Activities Other Therapeutic Activities    Other Therapeutic Activities Pt was able to lift LLE from  sitting from edge of mat using a gait belt as a leg loop right above the knee. Pt used her head and rocking back diagnoally to the right to be able to achieve this. PT had to provided assisstance to bring up RLE to the mat. PT provided mod assist to help move pt's legs to the center of the mat once in supine. Pt was able to roll into right sidelying independently lifting LLE w/ leg loop and PT verbal cueing to confirm LLE was up high enough. Pt then used momentum from UE to roll to right. Pt demonstrated difficutly trying to roll towards her right several times after initial roll. Pt required min assistance from PT to help hip over to allow pt to achieve sidelying position.Pt required min assistance from PT to put R elbow under her to help her accomplish c shape. PT provided min guard while pt walked herself forward. Pt required mod assitance to go up to long sit as she had a hard time keeping weight forward  and fell back. Pt reset into c shape position but was limited by fatigue to get back up to long sit and required mod assistance to return to long sit. Pt tried using leg loop around LLE to unweight herself before attempting to go from c shape to sitting. PT unweighted pt's feet to allow pt to go from long sitting to sitting on edge of mat. Pt had increased difficulty moving legs independently and required PT min assistance to get legs off the table. Pt performed an independent up hill slide board transfer back to w/c. Due to transfer being uphill, pt required several scoots to get up slideboard as she kept sliding down. Pt placed LUE beside hip to keep her from sliding down the slideboard. Pt only needed min assistance from PT to position feet.      Exercises   Exercises Other Exercises    Other Exercises  Long sit self hamstring stretch 30 sec x 3 then PT student adding in overpressure for increased stretch x 30 sec.                       PT Short Term Goals - 04/16/21 1906       PT SHORT TERM GOAL #1   Title Pt will be able to perform initial HEP for stretching and strengthening with caregiver.    Baseline pt reports "we are starting to work on them". 04/16/21 Pt has been limited on performance as trying to get caregiver to help her. New one just started and PT started training on stretching today.    Time 4    Period Weeks    Status On-going    Target Date 04/17/21      PT SHORT TERM GOAL #2   Title Pt will be able to perform slideboard transfer on level surface supervision after no more than min assist to help with board placement.    Baseline Pt required max assist for board placement in wheelchair and on mat -  03-19-21. 04/16/21 supervision for transfer other than to readjust feet. Pt able to place board on own with going left and min assist with going right    Time 4    Period Weeks    Status Achieved    Target Date 04/17/21      PT SHORT TERM GOAL #3   Title Pt will be  able to perform rolling to each side supervision for improved bed mobility.  Baseline 04/16/21 rolling left supervision using leg loop to help flex LLE up. Min assist to roll left to get RLE in flexed position    Time 4    Period Weeks    Status Partially Met    Target Date 04/17/21      PT SHORT TERM GOAL #4   Title Pt will be able to perform sit to/from supine transfer mod assist for improved bed mobility.    Baseline needs assist to transfer LE's onto mat for sit to supine; able to hook LUE to pull up to sitting from supine to long sitting    Time 4    Period Weeks    Status Achieved    Target Date 03/20/21      PT SHORT TERM GOAL #5   Title Pt will be able to maintain sitting balance edge of mat with only feet supported x 5 min while performing UE movements for improved sitting balance/trunk stability to assist with sitting ADLs.    Baseline met 03-19-21    Time 4    Period Weeks    Status Achieved    Target Date 03/20/21               PT Long Term Goals - 05/14/21 1327       PT LONG TERM GOAL #1   Title Pt will be able to perform progressive HEP for strengthening, stretching and balance to continue gains on own. (LTGs due 05/15/21)    Baseline 05/14/21- HEP provided but will continue to be progressed    Time 12    Period Weeks    Status On-going      PT LONG TERM GOAL #2   Title Pt will be able to perform squat pivot transfer/lateral scoot transfer without slideboard CGA for improved mobility.    Baseline 05/14/21- Pt still uses slide board and requires PT assist to reposition feet.    Time 12    Period Weeks    Status Not Met      PT LONG TERM GOAL #3   Title Pt will be able to stand at counter x 2 min min assist for improved standing ability.    Baseline 05/14/21- pt uses standing frame at this time    Time 12    Period Weeks    Status Deferred      PT LONG TERM GOAL #4   Title Pt will be able to perform all bed mobility CGA for improved function.     Baseline 05/14/21- Pt requires min to mod assitance to go from supine to sitting.    Time 12    Period Weeks    Status Not Met      PT LONG TERM GOAL #5   Title Pt will report being able to perform 20 minutes of manual w/c propulsion around home for improved UE strength and mobility. (should be getting manual chair soon)    Baseline 05/14/21- pt has manual w/c but has yet to use it    Time 12    Period Weeks    Status Not Met            Updated PT goals:  PT Short Term Goals - 05/14/21 1416       PT SHORT TERM GOAL #1   Title Pt will be able to perform progressive HEP for stretching and strengthening with caregiver assist including standing frame to continue gains at home.    Baseline PT continues to add to  program    Time 4    Period Weeks    Status Revised    Target Date 06/11/21      PT SHORT TERM GOAL #2   Title Pt will be able to perform slideboard transfer on nonlevel surfaces mod I except for assist to reposition feet.    Baseline 05/14/21 close supervision with assisting feet    Time 4    Period Weeks    Status Revised    Target Date 06/11/21      PT SHORT TERM GOAL #3   Title Pt will be able to roll right consistently with help of left leg loop mod I.    Baseline 05/14/21 currently supervision to min assist depending on fatigue level with leg loop    Time 4    Period Weeks    Status New    Target Date 06/11/21      PT SHORT TERM GOAL #4   Title Pt will transfer supine to long sit utilizing "C" walk technique and leg loop CGA.    Baseline 05/14/21 currently min/mod assist    Time 4    Period Weeks    Status New    Target Date 06/11/21      PT SHORT TERM GOAL #5   Title Pt will be able to propel manual w/c 250' supervision for improved mobility and aerobic conditioning/strengthening.    Baseline just received her manual chair and has not used    Time 4    Period Weeks    Status New    Target Date 06/11/21      Additional Short Term Goals   Additional  Short Term Goals Yes      PT SHORT TERM GOAL #6   Title Pt will be able to move legs off table long sit to short sit CGA for improved independence with mobility.    Baseline currently min assist    Time 4    Period Weeks    Status New    Target Date 06/11/21             PT Long Term Goals - 05/14/21 1424       PT LONG TERM GOAL #1   Title Pt will be able to perform short sit to supine transfer utilizing leg loop CGA for improved mobility.    Baseline currently able to get left leg up with loop but max assist for right.    Time 12    Period Weeks    Status New    Target Date 08/07/21      PT LONG TERM GOAL #2   Title Pt will be able to perform lateral scoot transfer without slideboard CGA for improved mobility.    Baseline 05/14/21- Pt still uses slide board and requires PT assist to reposition feet.    Time 12    Period Weeks    Status New    Target Date 08/07/21      PT LONG TERM GOAL #3   Title Pt will be able to stand at counter x 2 min mod assist with PT blocking right leg for improved standing ability/ strength.    Baseline 05/14/21- pt uses standing frame at this time    Time 12    Period Weeks    Status Revised    Target Date 08/07/21      PT LONG TERM GOAL #4   Title Pt will be able to maintain long sit without UE support x  5 min for improved core stability and assist with ADLs.    Time 12    Period Weeks    Status New    Target Date 08/07/21      PT LONG TERM GOAL #5   Title Pt will report being able to perform 20 minutes of manual w/c propulsion around home for improved UE strength and mobility.    Baseline 05/14/21- pt has manual w/c but has yet to use it    Time 12    Period Weeks    Status On-going    Target Date 08/07/21      Additional Long Term Goals   Additional Long Term Goals Yes      PT LONG TERM GOAL #6   Title Pt will be able to perform squat/pivot transfer mod assist of caregiver for improved mobility to uneven surfaces.    Baseline  unable    Time 12    Period Weeks    Status New    Target Date 08/07/21                   Plan - 05/14/21 1328     Clinical Impression Statement Today was pt's recertification visit. Pt's long term goals will be revised as PT has a better idea of pt's capablitlities at this time. Pt has shown improvements in bed mobility requiring only a leg loop and verbal cues to confirm LLE is high enough to roll towards the right. Pt is inconsisten w/ her rolling as sometimes she is unable to generate enough momentum to roll completely over and requires PT assistance to finish the roll. Pt still presents w/ weakness when having to move her RLE independently and when trying to move from supine to long sitting. Pt still requires PT to move her RLE and requires min assistance to get R elbow under her to allow her to get into C shape. Once in C shape pt still demonstrates deficits in being able to fully come up w/o PT assistance. Pt is able to transfer using slideboard more efficiently by herself when the tranfer is level requiring PT assistnace only to reposition feet, but requires extended time and multiple scoots to achieve uphill transfers. Pt still requires skilled PT to address the deficits above and to achieve revised STG/LTGs.    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 officer Evolving/Moderate complexity    Rehab Potential Good    PT Frequency 3x / week   plus eval   PT Duration 12 weeks    PT Treatment/Interventions ADLs/Self Care Home Management;Electrical Stimulation;DME Instruction;Neuromuscular re-education;Manual techniques;Therapeutic exercise;Balance training;Therapeutic activities;Cryotherapy;Moist Heat;Functional  mobility training;Stair training;Gait training;Patient/family education;Orthotic Fit/Training;Wheelchair mobility training;Dry needling;Passive range of motion;Vestibular    PT Next Visit Plan [name]- She plans to bring her manual w/c next session if you could look at transfers and beginning mobility with that. She has not used it yet. Continue SciFit if pt comes in her manual chair?  Slideboard transfer training working towards more level/uphill transfer, bed mobility. Continue to work on functional strengthening for UE, prone position for scapular strengthening. Continue work on long sit with moving legs on mat, balance with less UE support. Tricep dips using yoga blocks to try to get more lift. Utilizing "C" walk to come from sidelying to long sit.  At some point may try  bioness to try to get more quad activation with exercises and possibly with standing frame. Pt does have standing frame at home - have encouraged her to use more daily.    Consulted and Agree with Plan of Care Patient;Family member/caregiver    Family Member Consulted husband, [name]             Patient will benefit from skilled therapeutic intervention in order to improve the following deficits and impairments:  Decreased balance, Decreased mobility, Decreased strength, Impaired sensation, Postural dysfunction, Impaired flexibility, Impaired UE functional use, Impaired tone, Decreased range of motion  Visit Diagnosis: Muscle weakness (generalized)  Other symptoms and signs involving the nervous system  Abnormal posture  Quadriplegia, C5-C7 incomplete (HCC)   Other abnormalities of gait and mobility     Problem List Patient Active Problem List   Diagnosis Date Noted   Quadriplegia, C5-C7 incomplete ([place]) 01/16/2021   History of spinal fracture 01/16/2021   Suprapubic catheter ([place]) 01/16/2021   Encounter for routine gynecological examination 09/28/2013   Onychomycosis 09/28/2013   Foot deformity, acquired  03/26/2012   Encounter for preventive health examination 12/25/2010   ROSACEA 08/25/2009   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

## 2021-05-19 ENCOUNTER — Other Ambulatory Visit: Payer: Self-pay

## 2021-05-19 ENCOUNTER — Encounter: Payer: Self-pay | Admitting: Occupational Therapy

## 2021-05-19 ENCOUNTER — Ambulatory Visit: Payer: Medicare PPO | Admitting: Occupational Therapy

## 2021-05-19 ENCOUNTER — Ambulatory Visit: Payer: Medicare PPO | Admitting: Physical Therapy

## 2021-05-19 DIAGNOSIS — M6281 Muscle weakness (generalized): Secondary | ICD-10-CM | POA: Diagnosis not present

## 2021-05-19 DIAGNOSIS — R2689 Other abnormalities of gait and mobility: Secondary | ICD-10-CM | POA: Diagnosis not present

## 2021-05-19 DIAGNOSIS — R278 Other lack of coordination: Secondary | ICD-10-CM | POA: Diagnosis not present

## 2021-05-19 DIAGNOSIS — R29818 Other symptoms and signs involving the nervous system: Secondary | ICD-10-CM

## 2021-05-19 DIAGNOSIS — G8254 Quadriplegia, C5-C7 incomplete: Secondary | ICD-10-CM | POA: Diagnosis not present

## 2021-05-19 DIAGNOSIS — R208 Other disturbances of skin sensation: Secondary | ICD-10-CM | POA: Diagnosis not present

## 2021-05-19 DIAGNOSIS — R293 Abnormal posture: Secondary | ICD-10-CM | POA: Diagnosis not present

## 2021-05-19 NOTE — Therapy (Signed)
[facility] [address] [place], [place], [address] Phone: [phone]   Fax:  [phone]  Occupational Therapy Treatment  Patient Details  Name: [name] MRN: [id number] Date of Birth: 24-Jun-1950 Referring Provider (OT): Ina Homes, MD   Encounter Date: 05/19/2021   OT End of Session - 05/19/21 1404     Visit Number 19    Number of Visits 25    Date for OT Re-Evaluation 06/05/21    Authorization Type [employer]    Authorization Time Period Auth Req'd - 25 visits 05/06/21 - 07/19/21    Authorization - Visit Number 46    Authorization - Number of Visits 41   16+25   Progress Note Due on Visit 20    OT Start Time 1100    OT Stop Time 1145    OT Time Calculation (min) 45 min    Activity Tolerance Patient tolerated treatment well    Behavior During Therapy [facility] for tasks assessed/performed             Past Medical History:  Diagnosis Date   CERVICAL POLYP 03/11/2008   Qualifier: Diagnosis of  By: Regis Bill MD, Standley Brooking    Colon polyps 2005   on colonscopy Dr. Fuller Plan   Fibroid 2004   Per Dr. Ouida Sills   History of shingles    face and mouth   Hx of skin cancer, basal cell    Rosacea    Sciatica of left side 09/28/2013   Scoliosis    noted on mri done for back pain    Past Surgical History:  Procedure Laterality Date   BUNIONECTOMY      There were no vitals filed for this visit.   Subjective Assessment - 05/19/21 1103     Subjective  "it's busy today"    Patient is accompanied by: --   caregiver, [name]   Pertinent History hyperlipidemia, scoliosis, sleep disorder    Patient Stated Goals regain arm strength for rolling over in bed and fine motor coordination for increasing typing    Currently in Pain? Yes    Pain Score 3     Pain Location Arm    Pain Orientation Left;Right;Lower    Pain Descriptors / Indicators Aching;Throbbing    Pain Type Chronic pain;Neuropathic pain     Pain Onset More than a month ago    Pain Frequency Constant                          OT Treatments/Exercises (OP) - 05/19/21 1406       Bed Mobility   Bed Mobility Sit to Supine    Supine to Sit Moderate Assistance - Patient 50-74%    Sit to Supine Moderate Assistance - Patient 50-74%   pt was able to use momentum to get LLE to mat with min A.     Transfers   Comments sliding board transfer power chair > edge of mat > manual chair with CGA and set up assistance      ADLs   UB Dressing doffed and donned open face jacket/sweater. Doffed with supervision and increased time. Donned with min A and assistance for getting zipper started    LB Dressing worked on simulating positions and postures and balancing for LB dressing. Worked in long sitting and attempted to achieve figure 4 sit with LLE bent but unable to get LLE in position with leg lift on LLE without losing  balance to back. Will continue to work on increasing ability to get in suitable position for LB dressing. Pt used leg lift on LLE to cross LLE over right to simulate technique for LB Dressing in sitting at edge of mat. Pt with LOB x 1 to right and working on determining center for sitting balance while crossing leg.      Neurological Re-education Exercises   Other Exercises 1 worked on bilateral weight bearing into palms of hands and reaching across body and to floor with no LOB and SBA with reaching for cones. Pt with good trunk control during activity                      OT Short Term Goals - 04/16/21 1116       OT SHORT TERM GOAL #1   Title Pt will be independent with HEP w CG assistance PRN    Time 4    Period Weeks    Status On-going    Target Date 04/13/21      OT SHORT TERM GOAL #2   Title Pt will verbalize understanding and report independence with CG assistance with ues of modalities at home with good safety.    Baseline has paraffin and Estim unit    Time 4    Period Weeks     Status Achieved   pt has new caregiver she is training for paraffin - reviewed and demonstrated understanding of estim unit     OT SHORT TERM GOAL #3   Title Pt will increase Box and Blocks score with RUE to 30 blocks or greater    Baseline R 26 L 39    Time 4    Period Weeks    Status On-going   R 22 blocks  04/16/21     OT SHORT TERM GOAL #4   Title Pt will increase BUE tricep strength to 4/5 consistently for increasing ability to perform SB transfers with supervision/set up assistance only.    Baseline 3+/5 strength BUE    Time 4    Period Weeks    Status Achieved      OT SHORT TERM GOAL #5   Title Pt will increase coordination in LUE to completing 9 hole peg test in 70 seconds or less.    Baseline L 75.13s, R unable    Time 4    Period Weeks    Status Achieved   L 63.87s 04/16/21     OT SHORT TERM GOAL #6   Title Pt will verbalize understanding of adapted strategies and/or equipment for increasing indepenence with ADLs and IADLs (typing, bathing, cutting food, etc)    Baseline has U cuff    Time 4    Period Weeks    Status On-going   has verbalized understanding of a lot of AE but continues to benefit from education              OT Long Term Goals - 03/16/21 0943       OT LONG TERM GOAL #1   Title Pt will be independence with any updated HEP    Time 12    Period Weeks    Status New    Target Date 06/08/21      OT LONG TERM GOAL #2   Title Pt will increase functional use of BUE evidenced by completing Box and Blocks with score of 35 or greater with RUE, 45 or greater with LUE.    Baseline R  26, L 39    Time 12    Period Weeks    Status New      OT LONG TERM GOAL #3   Title PT will improve grip strength in BUE by increasing grip strength to 10 lbs or greater with RUE and 20 lbs or greater with LUE.    Baseline R 1.5, L 12.3    Time 12    Period Weeks    Status New      OT LONG TERM GOAL #4   Title Pt will improve isolated finger movements in order to  increase skill towards simple typing with adapted strategies and equipment PRN.    Baseline isolated movement in LUE    Time 12    Period Weeks    Status New      OT LONG TERM GOAL #5   Title Pt will improve 9 hole peg test in LUE to completing in 65 seconds or less in order to increase functional use and demonstrate ability to place 2 or more pegs with RUE.    Baseline L 75.13s, R unable    Time 12    Period Weeks    Status New      OT LONG TERM GOAL #6   Title Pt will report completing UB and LB dressing with decreased assistance consistently.    Baseline UB (reports able to do but not consistently doing), LB total A    Time 12    Period Weeks    Status New                   Plan - 05/19/21 1413     Clinical Impression Statement Pt with good trunk control and ability to reach across midline and to floor while seated edge of mat today. Pt continues to progress.    OT Occupational Profile and History Detailed Assessment- Review of Records and additional review of physical, cognitive, psychosocial history related to current functional performance    Occupational performance deficits (Please refer to evaluation for details): ADL's;IADL's;Leisure;Work;Rest and Sleep    Body Structure / Function / Physical Skills ADL;IADL;ROM;Strength;Decreased knowledge of use of DME;Dexterity;GMC;Pain;Tone;UE functional use;Body mechanics;Balance;Continence;FMC;Muscle spasms;Skin integrity;Flexibility;Mobility;Sensation;Improper spinal/pelvic alignment;Endurance    Rehab Potential Good    Clinical Decision Making Several treatment options, min-mod task modification necessary    Comorbidities Affecting Occupational Performance: May have comorbidities impacting occupational performance    Modification or Assistance to Complete Evaluation  Min-Moderate modification of tasks or assist with assess necessary to complete eval    OT Frequency 2x / week    OT Duration 12 weeks    OT  Treatment/Interventions Self-care/ADL training;Moist Heat;Fluidtherapy;DME and/or AE instruction;Splinting;Therapeutic activities;Aquatic Therapy;Ultrasound;Therapeutic exercise;Cognitive remediation/compensation;Passive range of motion;Functional Mobility Training;Neuromuscular education;Electrical Stimulation;Paraffin;Manual Therapy;Patient/family education    Plan mat work with progressing towards LB Dressing, RUE and LUE coordination (RUE< LUE), progress note next visit    Consulted and Agree with Plan of Care Patient;Family member/caregiver    Family Member Consulted spouse [name]             Patient will benefit from skilled therapeutic intervention in order to improve the following deficits and impairments:   Body Structure / Function / Physical Skills: ADL, IADL, ROM, Strength, Decreased knowledge of use of DME, Dexterity, GMC, Pain, Tone, UE functional use, Body mechanics, Balance, Continence, FMC, Muscle spasms, Skin integrity, Flexibility, Mobility, Sensation, Improper spinal/pelvic alignment, Endurance       Visit Diagnosis: Muscle weakness (generalized)  Other symptoms and signs involving  the nervous system  Quadriplegia, C5-C7 incomplete ([place])  Other abnormalities of gait and mobility  Other lack of coordination    Problem List Patient Active Problem List   Diagnosis Date Noted   Quadriplegia, C5-C7 incomplete ([place]) 01/16/2021   History of spinal fracture 01/16/2021   Suprapubic catheter ([place]) 01/16/2021   Encounter for routine gynecological examination 09/28/2013   Onychomycosis 09/28/2013   Foot deformity, acquired 03/26/2012   Encounter for preventive health examination 12/25/2010   ROSACEA 08/25/2009   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

## 2021-05-19 NOTE — Patient Instructions (Addendum)
Access Code: [email or web address] URL: [email or web address] Date: 05/19/2021 Prepared by: Misty Stanley  Exercises Supine Hip Flexion - 1 x daily - 7 x weekly - 1 sets - 10 reps Bent Knee Fallouts - 1 x daily - 7 x weekly - 2 sets - 10 reps Supine Quadricep Sets - 1 x daily - 7 x weekly - 2 sets - 10 reps Clamshell (Mirrored) - 1 x daily - 7 x weekly - 2 sets - 10 reps Sidelying Knee Flexion and Extension in Abduction - 1 x daily - 7 x weekly - 1 sets - 10 reps Supine Hamstring Stretch with Caregiver - 3-4 x daily - 7 x weekly - 1 sets - 4 reps - 1 min hold Supine Hip Internal Rotation PROM with Caregiver - 3-4 x daily - 7 x weekly - 1 sets - 4 reps - 1 min hold Supine Hip External Rotation PROM with Caregiver - 3-4 x daily - 7 x weekly - 1 sets - 4 reps - 1 min hold Supine Ankle Dorsiflexion Stretch with Caregiver - 3-4 x daily - 7 x weekly - 1 sets - 4 reps - 1 min hold Scapular Retraction with Resistance - 1 x daily - 3 x weekly - 2 sets - 10 reps Seated RIGHT ARM Pull with Wheel Chair - 1 x daily - 3 x weekly - 2 sets Seated Arm Extension with Band - 1 x daily - 3 x weekly - 2 sets - 12 reps Seated Single Arm Shoulder blade forward/back - 1 x daily - 3 x weekly - 2 sets - 12 reps Seated Single Arm Chest Press with Anchored Resistance - 1 x daily - 3 x weekly - 2 sets - 12 reps

## 2021-05-19 NOTE — Therapy (Signed)
[facility] [address] [place], [place], [address] Phone: [phone]   Fax:  [phone]  Physical Therapy Treatment  Patient Details  Name: [name] MRN: [id number] Date of Birth: Mar 21, 1951 Referring Provider (PT): Landis Gandy   Encounter Date: 05/19/2021   PT End of Session - 05/19/21 1251     Visit Number 26    Number of Visits 57    Date for PT Re-Evaluation 08/07/21    Authorization Type [employer] medicare; new authorization has been requested    Authorization - Visit Number --    Authorization - Number of Visits --    Progress Note Due on Visit 30    PT Start Time 1146    PT Stop Time 1232    PT Time Calculation (min) 46 min    Equipment Utilized During Treatment Other (comment)   manual wheelchair, slideboard   Activity Tolerance Patient tolerated treatment well    Behavior During Therapy [facility] for tasks assessed/performed             Past Medical History:  Diagnosis Date   CERVICAL POLYP 03/11/2008   Qualifier: Diagnosis of  By: Regis Bill MD, Standley Brooking    Colon polyps 2005   on colonscopy Dr. Fuller Plan   Fibroid 2004   Per Dr. Ouida Sills   History of shingles    face and mouth   Hx of skin cancer, basal cell    Rosacea    Sciatica of left side 09/28/2013   Scoliosis    noted on mri done for back pain    Past Surgical History:  Procedure Laterality Date   BUNIONECTOMY      There were no vitals filed for this visit.   Subjective Assessment - 05/19/21 1150     Subjective Still having a hard time with supine > sit through a C curve.  UE are getting stronger, is using the RUE to raise and lower self in the standing frame.  Brought manual chair today to work with.    Currently in Pain? Yes    Pain Score 3     Pain Location Arm    Pain Onset More than a month ago              [facility] Adult PT Treatment/Exercise - 05/19/21 1243       Transfers   Transfers Lateral/Scoot Transfers     Lateral/Scoot Transfers 4: Min Passenger transport manager Details (indicate cue type and reason) from manual wheelchair > power w/c uphill with large gap between; one person behind patient to prevent falling backwards and one person in front to assist with foot placement and to prevent pt from sliding back downhill.  Pt performed without any assistance for actual lateral scoot      Chief Technology Officer Yes    Wheelchair Assistance 4: Min Technical brewer Both upper extremities;Right upper extremity    Wheelchair Parts Management Needs assistance    Distance 230    Comments educated pt on most efficient and safe propulsion sequence with shoulder moving through full ROM from flexion <> extension; practiced on straight away path and then around gym with R and L turns.  Due to LUE stronger than RUE pt unable to maintain straight path without making shorter, faster strokes with RUE.  Provided pt with exercise of performing w/c propulsion with RUE only on level surface with caregiver on L side keeping pt  in straight path.  Performed x 100 x 2 reps.      Exercises   Exercises Other Exercises    Other Exercises  Added theraband resistance exercises to HEP for RUE and LUE but focused primarily on RUE strength to improve equal propulsion in manual w/c.  Pt return demonstrated each exercise with RUE            Access Code: [email or web address] URL: [email or web address] Date: 05/19/2021 Prepared by: Misty Stanley  Seated RIGHT ARM Pull with Wheel Chair - 1 x daily - 3 x weekly - 2 sets Seated Arm Extension with Band - 1 x daily - 3 x weekly - 2 sets - 12 reps Seated Single Arm Shoulder blade forward/back - 1 x daily - 3 x weekly - 2 sets - 12 reps Seated Single Arm Chest Press with Anchored Resistance - 1 x daily - 3 x weekly - 2 sets - 12 reps     PT Education - 05/19/21 1250     Education Details manual w/c mobility, updated shoulder exercises  on HEP    Person(s) Educated Patient;Caregiver(s)    Methods Explanation;Demonstration;Handout    Comprehension Verbalized understanding;Returned demonstration              PT Short Term Goals - 05/14/21 1416       PT SHORT TERM GOAL #1   Title Pt will be able to perform progressive HEP for stretching and strengthening with caregiver assist including standing frame to continue gains at home.    Baseline PT continues to add to program    Time 4    Period Weeks    Status Revised    Target Date 06/11/21      PT SHORT TERM GOAL #2   Title Pt will be able to perform slideboard transfer on nonlevel surfaces mod I except for assist to reposition feet.    Baseline 05/14/21 close supervision with assisting feet    Time 4    Period Weeks    Status Revised    Target Date 06/11/21      PT SHORT TERM GOAL #3   Title Pt will be able to roll right consistently with help of left leg loop mod I.    Baseline 05/14/21 currently supervision to min assist depending on fatigue level with leg loop    Time 4    Period Weeks    Status New    Target Date 06/11/21      PT SHORT TERM GOAL #4   Title Pt will transfer supine to long sit utilizing "C" walk technique and leg loop CGA.    Baseline 05/14/21 currently min/mod assist    Time 4    Period Weeks    Status New    Target Date 06/11/21      PT SHORT TERM GOAL #5   Title Pt will be able to propel manual w/c 250' supervision for improved mobility and aerobic conditioning/strengthening.    Baseline just received her manual chair and has not used    Time 4    Period Weeks    Status New    Target Date 06/11/21      Additional Short Term Goals   Additional Short Term Goals Yes      PT SHORT TERM GOAL #6   Title Pt will be able to move legs off table long sit to short sit CGA for improved independence with mobility.    Baseline currently min assist    Time  4    Period Weeks    Status New    Target Date 06/11/21               PT  Long Term Goals - 05/14/21 1424       PT LONG TERM GOAL #1   Title Pt will be able to perform short sit to supine transfer utilizing leg loop CGA for improved mobility.    Baseline currently able to get left leg up with loop but max assist for right.    Time 12    Period Weeks    Status New    Target Date 08/07/21      PT LONG TERM GOAL #2   Title Pt will be able to perform lateral scoot transfer without slideboard CGA for improved mobility.    Baseline 05/14/21- Pt still uses slide board and requires PT assist to reposition feet.    Time 12    Period Weeks    Status New    Target Date 08/07/21      PT LONG TERM GOAL #3   Title Pt will be able to stand at counter x 2 min mod assist with PT blocking right leg for improved standing ability/ strength.    Baseline 05/14/21- pt uses standing frame at this time    Time 12    Period Weeks    Status Revised    Target Date 08/07/21      PT LONG TERM GOAL #4   Title Pt will be able to maintain long sit without UE support x 5 min for improved core stability and assist with ADLs.    Time 12    Period Weeks    Status New    Target Date 08/07/21      PT LONG TERM GOAL #5   Title Pt will report being able to perform 20 minutes of manual w/c propulsion around home for improved UE strength and mobility.    Baseline 05/14/21- pt has manual w/c but has yet to use it    Time 12    Period Weeks    Status On-going    Target Date 08/07/21      Additional Long Term Goals   Additional Long Term Goals Yes      PT LONG TERM GOAL #6   Title Pt will be able to perform squat/pivot transfer mod assist of caregiver for improved mobility to uneven surfaces.    Baseline unable    Time 12    Period Weeks    Status New    Target Date 08/07/21                   Plan - 05/19/21 1253     Clinical Impression Statement Focused on initiation of manual wheelchair propulsion training over level, smooth surfaces with pt demonstrating increased  difficulty maintaining straight path due to RUE weakness compared to LUE.  Provided pt with exercises to perform in manual wheelchair to address RUE weakness and to improve propulsion independence and efficiency.  No increase in UE pain with training today.    Personal Factors and Comorbidities Comorbidity 2    Comorbidities scoliosis and sleep disorder    Examination-Activity Limitations Bed Mobility;Locomotion Level;Transfers;Stand;Bathing;Dressing    Examination-Participation Freight forwarder;Yard Work    Stability/Clinical Decision Making Evolving/Moderate complexity    Rehab Potential Good    PT Frequency 3x / week   plus eval   PT Duration 12 weeks    PT  Treatment/Interventions ADLs/Self Care Home Management;Electrical Stimulation;DME Instruction;Neuromuscular re-education;Manual techniques;Therapeutic exercise;Balance training;Therapeutic activities;Cryotherapy;Moist Heat;Functional mobility training;Stair training;Gait training;Patient/family education;Orthotic Fit/Training;Wheelchair mobility training;Dry needling;Passive range of motion;Vestibular    PT Next Visit Plan How are UE exercises in manual w/c going??  Look at slideboard manual w/c <> mat and parts management on manual w/c.  Slideboard transfer training working towards more level/uphill transfer, bed mobility. Continue to work on functional strengthening for UE, prone position for scapular strengthening. Continue work on long sit with moving legs on mat, balance with less UE support. Tricep dips using yoga blocks to try to get more lift. Utilizing "C" walk to come from sidelying to long sit.  At some point may try bioness to try to get more quad activation with exercises and possibly with standing frame. Pt does have standing frame at home - have encouraged her to use more daily.    Consulted and Agree with Plan of Care Patient;Family member/caregiver    Family Member Consulted Caregiver - [name]              Patient will benefit from skilled therapeutic intervention in order to improve the following deficits and impairments:  Decreased balance, Decreased mobility, Decreased strength, Impaired sensation, Postural dysfunction, Impaired flexibility, Impaired UE functional use, Impaired tone, Decreased range of motion  Visit Diagnosis: Muscle weakness (generalized)  Other symptoms and signs involving the nervous system  Quadriplegia, C5-C7 incomplete [facility])     Problem List Patient Active Problem List   Diagnosis Date Noted   Quadriplegia, C5-C7 incomplete ([place]) 01/16/2021   History of spinal fracture 01/16/2021   Suprapubic catheter ([place]) 01/16/2021   Encounter for routine gynecological examination 09/28/2013   Onychomycosis 09/28/2013   Foot deformity, acquired 03/26/2012   Encounter for preventive health examination 12/25/2010   ROSACEA 08/25/2009   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 [place], [place], [address] Phone: [phone]   Fax:  [phone]  Name: [name] MRN: [id number] Date of Birth: 08/10/1950

## 2021-05-21 ENCOUNTER — Ambulatory Visit: Payer: Medicare PPO | Admitting: Occupational Therapy

## 2021-05-21 ENCOUNTER — Encounter: Payer: Self-pay | Admitting: Occupational Therapy

## 2021-05-21 ENCOUNTER — Ambulatory Visit: Payer: Medicare PPO

## 2021-05-21 ENCOUNTER — Other Ambulatory Visit: Payer: Self-pay

## 2021-05-21 DIAGNOSIS — R278 Other lack of coordination: Secondary | ICD-10-CM | POA: Diagnosis not present

## 2021-05-21 DIAGNOSIS — M6281 Muscle weakness (generalized): Secondary | ICD-10-CM

## 2021-05-21 DIAGNOSIS — R29818 Other symptoms and signs involving the nervous system: Secondary | ICD-10-CM

## 2021-05-21 DIAGNOSIS — G8254 Quadriplegia, C5-C7 incomplete: Secondary | ICD-10-CM

## 2021-05-21 DIAGNOSIS — R293 Abnormal posture: Secondary | ICD-10-CM | POA: Diagnosis not present

## 2021-05-21 DIAGNOSIS — R2689 Other abnormalities of gait and mobility: Secondary | ICD-10-CM | POA: Diagnosis not present

## 2021-05-21 DIAGNOSIS — R208 Other disturbances of skin sensation: Secondary | ICD-10-CM | POA: Diagnosis not present

## 2021-05-21 NOTE — Therapy (Signed)
[facility] [address] [place], [place], [address] Phone: [phone]   Fax:  [phone]  Physical Therapy Treatment  Patient Details  Name: [name] MRN: [id number] Date of Birth: 07-06-1950 Referring Provider (PT): Landis Gandy   Encounter Date: 05/21/2021   PT End of Session - 05/21/21 1228     Visit Number 27    Number of Visits 13    Date for PT Re-Evaluation 08/07/21    Authorization Type [employer]; new authorization has been requested    Progress Note Due on Visit 30    PT Start Time 1105    PT Stop Time 1150    PT Time Calculation (min) 45 min    Equipment Utilized During Treatment --   slideboard   Activity Tolerance Patient tolerated treatment well    Behavior During Therapy [facility] for tasks assessed/performed             Past Medical History:  Diagnosis Date   CERVICAL POLYP 03/11/2008   Qualifier: Diagnosis of  By: Regis Bill MD, Standley Brooking    Colon polyps 2005   on colonscopy Dr. Fuller Plan   Fibroid 2004   Per Dr. Ouida Sills   History of shingles    face and mouth   Hx of skin cancer, basal cell    Rosacea    Sciatica of left side 09/28/2013   Scoliosis    noted on mri done for back pain    Past Surgical History:  Procedure Laterality Date   BUNIONECTOMY      There were no vitals filed for this visit.   Subjective Assessment - 05/21/21 1106     Subjective Pt reports not having time to do new HEP due to [place] being off and having company over.    Currently in Pain? Yes    Pain Score 4     Pain Location Arm    Pain Orientation Right;Left    Pain Descriptors / Indicators Aching;Radiating;Shooting    Pain Type Chronic pain    Pain Onset More than a month ago                               [facility] Adult PT Treatment/Exercise - 05/21/21 1232       Bed Mobility   Bed Mobility Sit to Supine;Supine to Sit    Supine to Sit Moderate Assistance - Patient  50-74%   Pt was able to bring LLE up using leg loop but got caught up on table. PT needed to bring up RLE   Sit to Supine Total Assistance - Patient < 25%   Pt came up from supine to long sit anchoring on PT's arm an being pulled up.     Transfers   Transfers Lateral/Scoot Transfers    Lateral/Scoot Transfers 4: Min assist    Lateral/Scoot Transfer Details (indicate cue type and reason) Pt performed lateral scoot using slide board from w/c to mat. Pt is independent and only requires min assistance to reposition feet. Pt was able to perfrom downhill lateral scoot transfer from mat to w/c w/o use of slideboard. Pt required min assist to position pt's feet. PT provided min guard for safety.    Comments Pt able to assist with sliding legs off mat long sit to short sit with PT unweighting heels and pt pushing right leg and utilizing leg loop on right to help move.  Exercises   Exercises Other Exercises    Other Exercises  Long sit self hamstring stretch 30 sec x 3 then PT student adding in overpressure for increased stretch x 30 sec. In long sit pt performed modified dips using round targets for hand grips 2x8. PT instructed pt to have 5 second hold at the top to increase triceps firing. In long sitting w/ blue physioball from behind using wooden dowel w/ red theraband and right glove for grip: pt performed 2x8 of bilat UE straight arm shoulder extension. Pt then performed 2x8 of rows. Pt performed 2x8 chest press focused on tricep strengthening. Pt was instructed to control the eccentric on all movements. Pt slowly reduced lean on physioball for reduced back support and increased core activation.                       PT Short Term Goals - 05/14/21 1416       PT SHORT TERM GOAL #1   Title Pt will be able to perform progressive HEP for stretching and strengthening with caregiver assist including standing frame to continue gains at home.    Baseline PT continues to add to program     Time 4    Period Weeks    Status Revised    Target Date 06/11/21      PT SHORT TERM GOAL #2   Title Pt will be able to perform slideboard transfer on nonlevel surfaces mod I except for assist to reposition feet.    Baseline 05/14/21 close supervision with assisting feet    Time 4    Period Weeks    Status Revised    Target Date 06/11/21      PT SHORT TERM GOAL #3   Title Pt will be able to roll right consistently with help of left leg loop mod I.    Baseline 05/14/21 currently supervision to min assist depending on fatigue level with leg loop    Time 4    Period Weeks    Status New    Target Date 06/11/21      PT SHORT TERM GOAL #4   Title Pt will transfer supine to long sit utilizing "C" walk technique and leg loop CGA.    Baseline 05/14/21 currently min/mod assist    Time 4    Period Weeks    Status New    Target Date 06/11/21      PT SHORT TERM GOAL #5   Title Pt will be able to propel manual w/c 250' supervision for improved mobility and aerobic conditioning/strengthening.    Baseline just received her manual chair and has not used    Time 4    Period Weeks    Status New    Target Date 06/11/21      Additional Short Term Goals   Additional Short Term Goals Yes      PT SHORT TERM GOAL #6   Title Pt will be able to move legs off table long sit to short sit CGA for improved independence with mobility.    Baseline currently min assist    Time 4    Period Weeks    Status New    Target Date 06/11/21               PT Long Term Goals - 05/14/21 1424       PT LONG TERM GOAL #1   Title Pt will be able to perform short sit to supine  transfer utilizing leg loop CGA for improved mobility.    Baseline currently able to get left leg up with loop but max assist for right.    Time 12    Period Weeks    Status New    Target Date 08/07/21      PT LONG TERM GOAL #2   Title Pt will be able to perform lateral scoot transfer without slideboard CGA for improved mobility.     Baseline 05/14/21- Pt still uses slide board and requires PT assist to reposition feet.    Time 12    Period Weeks    Status New    Target Date 08/07/21      PT LONG TERM GOAL #3   Title Pt will be able to stand at counter x 2 min mod assist with PT blocking right leg for improved standing ability/ strength.    Baseline 05/14/21- pt uses standing frame at this time    Time 12    Period Weeks    Status Revised    Target Date 08/07/21      PT LONG TERM GOAL #4   Title Pt will be able to maintain long sit without UE support x 5 min for improved core stability and assist with ADLs.    Time 12    Period Weeks    Status New    Target Date 08/07/21      PT LONG TERM GOAL #5   Title Pt will report being able to perform 20 minutes of manual w/c propulsion around home for improved UE strength and mobility.    Baseline 05/14/21- pt has manual w/c but has yet to use it    Time 12    Period Weeks    Status On-going    Target Date 08/07/21      Additional Long Term Goals   Additional Long Term Goals Yes      PT LONG TERM GOAL #6   Title Pt will be able to perform squat/pivot transfer mod assist of caregiver for improved mobility to uneven surfaces.    Baseline unable    Time 12    Period Weeks    Status New    Target Date 08/07/21                   Plan - 05/21/21 1228     Clinical Impression Statement Today's focus was on continued strengthening of BUE for improvements in functional mobility and manual wheel chair mobility. Pt demonstrated improvements in UE strength being able to transfer into wheelchiar from mat w/o use of slideboard. Pt still requires PT assist to move lower extremities espeically RLE. PT will cotinue to progress pt as tolerated w/ POC towards achieving pt's LTG.    Personal Factors and Comorbidities Comorbidity 2    Comorbidities scoliosis and sleep disorder    Examination-Activity Limitations Bed Mobility;Locomotion  Level;Transfers;Stand;Bathing;Dressing    Examination-Participation Freight forwarder;Yard Work    [employer] officer Evolving/Moderate complexity    Rehab Potential Good    PT Frequency 3x / week   plus eval   PT Duration 12 weeks    PT Treatment/Interventions ADLs/Self Care Home Management;Electrical Stimulation;DME Instruction;Neuromuscular re-education;Manual techniques;Therapeutic exercise;Balance training;Therapeutic activities;Cryotherapy;Moist Heat;Functional mobility training;Stair training;Gait training;Patient/family education;Orthotic Fit/Training;Wheelchair mobility training;Dry needling;Passive range of motion;Vestibular    PT Next Visit Plan How are UE exercises in manual w/c going??  Look at slideboard manual w/c <> mat and parts management on manual w/c.  Slideboard transfer training  working towards more level/uphill transfer, bed mobility. Continue to work on functional strengthening for UE, prone position for scapular strengthening. Continue work on long sit with moving legs on mat, balance with less UE support. Tricep dips using yoga blocks to try to get more lift. Utilizing "C" walk to come from sidelying to long sit.  At some point may try bioness to try to get more quad activation with exercises and possibly with standing frame. Pt does have standing frame at home - have encouraged her to use more daily.    Consulted and Agree with Plan of Care Patient;Family member/caregiver    Family Member Consulted Caregiver - [name]             Patient will benefit from skilled therapeutic intervention in order to improve the following deficits and impairments:  Decreased balance, Decreased mobility, Decreased strength, Impaired sensation, Postural dysfunction, Impaired flexibility, Impaired UE functional use, Impaired tone, Decreased range of motion  Visit Diagnosis: Muscle weakness (generalized)  Quadriplegia, C5-C7 incomplete  ([place])     Problem List Patient Active Problem List   Diagnosis Date Noted   Quadriplegia, C5-C7 incomplete ([place]) 01/16/2021   History of spinal fracture 01/16/2021   Suprapubic catheter ([place]) 01/16/2021   Encounter for routine gynecological examination 09/28/2013   Onychomycosis 09/28/2013   Foot deformity, acquired 03/26/2012   Encounter for preventive health examination 12/25/2010   ROSACEA 08/25/2009   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 [place], [place], [address] Phone: [phone]   Fax:  [phone]  Name: Shandora Koogler MRN: [id number] Date of Birth: 1951/04/18

## 2021-05-21 NOTE — Therapy (Signed)
[facility] [address] [place], [place], [address] Phone: [phone]   Fax:  [phone]  Occupational Therapy Treatment & 20th visit Progress Note  Patient Details  Name: [name] MRN: [id number] Date of Birth: 05/31/1951 Referring Provider (OT): Ina Homes, MD   Encounter Date: 05/21/2021   OT End of Session - 05/21/21 1021     Visit Number 20    Number of Visits 25    Date for OT Re-Evaluation 06/05/21    Authorization Type [employer]    Authorization Time Period Auth Req'd - 25 visits 05/06/21 - 07/19/21    Authorization - Visit Number 70    Authorization - Number of Visits 41   16+25   Progress Note Due on Visit 20    OT Start Time 1018    OT Stop Time 1100    OT Time Calculation (min) 42 min    Activity Tolerance Patient tolerated treatment well    Behavior During Therapy [facility] for tasks assessed/performed             Past Medical History:  Diagnosis Date   CERVICAL POLYP 03/11/2008   Qualifier: Diagnosis of  By: Regis Bill MD, Standley Brooking    Colon polyps 2005   on colonscopy Dr. Fuller Plan   Fibroid 2004   Per Dr. Ouida Sills   History of shingles    face and mouth   Hx of skin cancer, basal cell    Rosacea    Sciatica of left side 09/28/2013   Scoliosis    noted on mri done for back pain    Past Surgical History:  Procedure Laterality Date   BUNIONECTOMY      There were no vitals filed for this visit.     Fabricated short opponens splint for RUE for encouraging palmar abduction for increasing ability to use pincer/tip pinch vs lateral pinch for grasping objects. Will continue to work on splint and wear and care next session.           OT Short Term Goals - 05/21/21 1103       OT SHORT TERM GOAL #1   Title Pt will be independent with HEP w CG assistance PRN    Time 4    Period Weeks    Status Achieved    Target Date 04/13/21      OT SHORT TERM GOAL #2    Title Pt will verbalize understanding and report independence with CG assistance with ues of modalities at home with good safety.    Baseline has paraffin and Estim unit    Time 4    Period Weeks    Status Achieved   pt has new caregiver she is training for paraffin - reviewed and demonstrated understanding of estim unit     OT SHORT TERM GOAL #3   Title Pt will increase Box and Blocks score with RUE to 30 blocks or greater    Baseline R 26 L 39    Time 4    Period Weeks    Status On-going   R 22 blocks  04/16/21, 5 blocks RUE 05/21/21 d/t finger cramping     OT SHORT TERM GOAL #4   Title Pt will increase BUE tricep strength to 4/5 consistently for increasing ability to perform SB transfers with supervision/set up assistance only.    Baseline 3+/5 strength BUE    Time 4    Period Weeks    Status Achieved  OT SHORT TERM GOAL #5   Title Pt will increase coordination in LUE to completing 9 hole peg test in 70 seconds or less.    Baseline L 75.13s, R unable    Time 4    Period Weeks    Status Achieved   L 63.87s 04/16/21     OT SHORT TERM GOAL #6   Title Pt will verbalize understanding of adapted strategies and/or equipment for increasing indepenence with ADLs and IADLs (typing, bathing, cutting food, etc)    Baseline has U cuff    Time 4    Period Weeks    Status Achieved   has verbalized understanding of a lot of AE but continues to benefit from education              OT Long Term Goals - 05/21/21 1033       OT LONG TERM GOAL #1   Title Pt will be independence with any updated HEP    Time 12    Period Weeks    Status On-going    Target Date 06/08/21      OT LONG TERM GOAL #2   Title Pt will increase functional use of BUE evidenced by completing Box and Blocks with score of 35 or greater with RUE, 45 or greater with LUE.    Baseline R 26, L 39    Time 12    Period Weeks    Status On-going   have not assessed LUE, decline in RUE d/t finger cramping see STG.  05/21/21     OT LONG TERM GOAL #3   Title PT will improve grip strength in BUE by increasing grip strength to 10 lbs or greater with RUE and 20 lbs or greater with LUE.    Baseline R 1.5, L 12.3    Time 12    Period Weeks    Status On-going   RUE 3.3 lbs, LUE 14.5 lbs     OT LONG TERM GOAL #4   Title Pt will improve isolated finger movements in order to increase skill towards simple typing with adapted strategies and equipment PRN.    Baseline isolated movement in LUE    Time 12    Period Weeks    Status On-going      OT LONG TERM GOAL #5   Title Pt will improve 9 hole peg test in LUE to completing in 65 seconds or less in order to increase functional use and demonstrate ability to place 2 or more pegs with RUE.    Baseline L 75.13s, R unable    Time 12    Period Weeks    Status Partially Met   71.19s LUE 05/21/21, was able to place 2 pegs with RUE 05/21/21     OT LONG TERM GOAL #6   Title Pt will report completing UB and LB dressing with decreased assistance consistently.    Baseline UB (reports able to do but not consistently doing), LB total A    Time 12    Period Weeks    Status On-going                   Plan - 05/21/21 1132     Clinical Impression Statement This note serves as progress note for the 20th therapy visit from period 03/13/21 - 05/21/21. Pt has met 5/6 STGs and progressing towards LTGs. Pt has demonstrated much improvement with overall coordination and strength. Pt is working towards increasing independence with ADLs and  IADLs and skilled occupational therapy continues to be beneficial.    OT Occupational Profile and History Detailed Assessment- Review of Records and additional review of physical, cognitive, psychosocial history related to current functional performance    Occupational performance deficits (Please refer to evaluation for details): ADL's;IADL's;Leisure;Work;Rest and Sleep    Body Structure / Function / Physical Skills  ADL;IADL;ROM;Strength;Decreased knowledge of use of DME;Dexterity;GMC;Pain;Tone;UE functional use;Body mechanics;Balance;Continence;FMC;Muscle spasms;Skin integrity;Flexibility;Mobility;Sensation;Improper spinal/pelvic alignment;Endurance    Rehab Potential Good    Clinical Decision Making Several treatment options, min-mod task modification necessary    Comorbidities Affecting Occupational Performance: May have comorbidities impacting occupational performance    Modification or Assistance to Complete Evaluation  Min-Moderate modification of tasks or assist with assess necessary to complete eval    OT Frequency 2x / week    OT Duration 12 weeks    OT Treatment/Interventions Self-care/ADL training;Moist Heat;Fluidtherapy;DME and/or AE instruction;Splinting;Therapeutic activities;Aquatic Therapy;Ultrasound;Therapeutic exercise;Cognitive remediation/compensation;Passive range of motion;Functional Mobility Training;Neuromuscular education;Electrical Stimulation;Paraffin;Manual Therapy;Patient/family education    Plan mat work with progressing towards LB Dressing, RUE and LUE coordination (RUE< LUE), make modifications to short opponens splint for RUE and trial with pincer grasp    Consulted and Agree with Plan of Care Patient;Family member/caregiver    Family Member Consulted spouse [name]             Patient will benefit from skilled therapeutic intervention in order to improve the following deficits and impairments:   Body Structure / Function / Physical Skills: ADL, IADL, ROM, Strength, Decreased knowledge of use of DME, Dexterity, GMC, Pain, Tone, UE functional use, Body mechanics, Balance, Continence, FMC, Muscle spasms, Skin integrity, Flexibility, Mobility, Sensation, Improper spinal/pelvic alignment, Endurance       Visit Diagnosis: Muscle weakness (generalized)  Quadriplegia, C5-C7 incomplete ([facility])  Other abnormalities of gait and mobility  Other lack of coordination  Other  symptoms and signs involving the nervous system    Problem List Patient Active Problem List   Diagnosis Date Noted   Quadriplegia, C5-C7 incomplete ([place]) 01/16/2021   History of spinal fracture 01/16/2021   Suprapubic catheter ([place]) 01/16/2021   Encounter for routine gynecological examination 09/28/2013   Onychomycosis 09/28/2013   Foot deformity, acquired 03/26/2012   Encounter for preventive health examination 12/25/2010   ROSACEA 08/25/2009   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 [place], [place], [address] Phone: [phone]   Fax:  [phone]  Name: [name] MRN: [id number] Date of Birth: 19-Mar-1951

## 2021-05-26 ENCOUNTER — Encounter: Payer: Self-pay | Admitting: Occupational Therapy

## 2021-05-26 ENCOUNTER — Ambulatory Visit: Payer: Medicare PPO

## 2021-05-26 ENCOUNTER — Ambulatory Visit: Payer: Medicare PPO | Admitting: Occupational Therapy

## 2021-05-26 ENCOUNTER — Other Ambulatory Visit: Payer: Self-pay

## 2021-05-26 DIAGNOSIS — R29818 Other symptoms and signs involving the nervous system: Secondary | ICD-10-CM

## 2021-05-26 DIAGNOSIS — M6281 Muscle weakness (generalized): Secondary | ICD-10-CM | POA: Diagnosis not present

## 2021-05-26 DIAGNOSIS — R278 Other lack of coordination: Secondary | ICD-10-CM | POA: Diagnosis not present

## 2021-05-26 DIAGNOSIS — R2689 Other abnormalities of gait and mobility: Secondary | ICD-10-CM | POA: Diagnosis not present

## 2021-05-26 DIAGNOSIS — G8254 Quadriplegia, C5-C7 incomplete: Secondary | ICD-10-CM

## 2021-05-26 DIAGNOSIS — R293 Abnormal posture: Secondary | ICD-10-CM | POA: Diagnosis not present

## 2021-05-26 DIAGNOSIS — R208 Other disturbances of skin sensation: Secondary | ICD-10-CM | POA: Diagnosis not present

## 2021-05-26 NOTE — Therapy (Signed)
[facility] [address] [place], [place], [address] Phone: [phone]   Fax:  [phone]  Physical Therapy Treatment  Patient Details  Name: [name] MRN: [id number] Date of Birth: [birth year] Referring Provider (PT): Landis Gandy   Encounter Date: 05/26/2021   PT End of Session - 05/26/21 1103     Visit Number 28    Number of Visits 55    Date for PT Re-Evaluation 08/07/21    Authorization Type [employer] medicare; new authorization has been requested    Progress Note Due on Visit 30    PT Start Time 1101    PT Stop Time 1149    PT Time Calculation (min) 48 min    Equipment Utilized During Treatment --   slideboard   Activity Tolerance Patient tolerated treatment well    Behavior During Therapy [facility] for tasks assessed/performed             Past Medical History:  Diagnosis Date   CERVICAL POLYP 03/11/2008   Qualifier: Diagnosis of  By: Regis Bill MD, Standley Brooking    Colon polyps 2005   on colonscopy Dr. Fuller Plan   Fibroid 2004   Per Dr. Ouida Sills   History of shingles    face and mouth   Hx of skin cancer, basal cell    Rosacea    Sciatica of left side 09/28/2013   Scoliosis    noted on mri done for back pain    Past Surgical History:  Procedure Laterality Date   BUNIONECTOMY      There were no vitals filed for this visit.   Subjective Assessment - 05/26/21 1104     Subjective Pt reports that she got in her manual chair and went outside some on pavement. She reports that biggest issue is right hand.    Currently in Pain? Yes    Pain Score 3     Pain Location Arm    Pain Orientation Right;Left    Pain Descriptors / Indicators Aching;Radiating;Shooting    Pain Type Neuropathic pain    Pain Onset More than a month ago                               [facility] Adult PT Treatment/Exercise - 05/26/21 1105       Transfers   Transfers Lateral/Scoot Transfers    Lateral/Scoot  Transfers 4: Min Passenger transport manager Details (indicate cue type and reason) Powerchair to mat to the left without slide board with 3-4 scoots to perform. PT student assisted to reposition feet and provided cuing to scoot forward first and be sure to lean forward and use arms to try to lift slightly to help with scoot. Mat to manual w/c to the right with slideboard. Pt only needed assist to  reposition feet during transfer. Manual chair to powerchair to left with slideboard with CGA for safety and then min assist to reposition feet. Transfer was slightly uphill.      Chief Technology Officer Yes    Wheelchair Assistance 4: Haematologist cues for Designer, jewellery Both upper extremities    Wheelchair Parts Management Needs assistance    Distance 345    Comments Pt was cued to push through full ROM with increasing shoulder extension. Pt reports that she was having some issues with right glove and middle  finger getting caught up at times when propelling. Had pt try to get a little more right wrist extension to allow for more tenodesis at fingers which did help some. Cued to push more with right and decreased some on left to allow her to stay straighter. 2 laps turning to left and 1 turning to right (which was easier). Close SBA with occasional CGA to avoid obstacles on right. Pt wondering about wheelies so PT discussed what would be needed to perform this and with chair on red mat and gait belt under frame tipped pt back so she could feel the tipping point and try to hold. Was challenged due to limitations in hand dexterity. Showed caregiver, [name], how she could use assisted wheelie to help pt up small curb or step. Also went over the anti-tipper bar insertion and [name] able to perform as well. Showed her how she could also tip them up if needed.      Exercises   Exercises Other Exercises      Knee/Hip Exercises: Aerobic    Other Aerobic Sci-Fit from manual w/c with front of foot plate on 5 and arms on 9 performed x 5 min level 2. Placed wedges behind wheels and CGA on w/c for safety to prevent tipping. Pt was given cues to push fully through left leg to try to straighten. Pt was able to grip enough on right to maintain hand on handle without active hand glove today.                       PT Short Term Goals - 05/14/21 1416       PT SHORT TERM GOAL #1   Title Pt will be able to perform progressive HEP for stretching and strengthening with caregiver assist including standing frame to continue gains at home.    Baseline PT continues to add to program    Time 4    Period Weeks    Status Revised    Target Date 06/11/21      PT SHORT TERM GOAL #2   Title Pt will be able to perform slideboard transfer on nonlevel surfaces mod I except for assist to reposition feet.    Baseline 05/14/21 close supervision with assisting feet    Time 4    Period Weeks    Status Revised    Target Date 06/11/21      PT SHORT TERM GOAL #3   Title Pt will be able to roll right consistently with help of left leg loop mod I.    Baseline 05/14/21 currently supervision to min assist depending on fatigue level with leg loop    Time 4    Period Weeks    Status New    Target Date 06/11/21      PT SHORT TERM GOAL #4   Title Pt will transfer supine to long sit utilizing "C" walk technique and leg loop CGA.    Baseline 05/14/21 currently min/mod assist    Time 4    Period Weeks    Status New    Target Date 06/11/21      PT SHORT TERM GOAL #5   Title Pt will be able to propel manual w/c 250' supervision for improved mobility and aerobic conditioning/strengthening.    Baseline just received her manual chair and has not used    Time 4    Period Weeks    Status New    Target Date 06/11/21  Additional Short Term Goals   Additional Short Term Goals Yes      PT SHORT TERM GOAL #6   Title Pt will be able to move  legs off table long sit to short sit CGA for improved independence with mobility.    Baseline currently min assist    Time 4    Period Weeks    Status New    Target Date 06/11/21               PT Long Term Goals - 05/14/21 1424       PT LONG TERM GOAL #1   Title Pt will be able to perform short sit to supine transfer utilizing leg loop CGA for improved mobility.    Baseline currently able to get left leg up with loop but max assist for right.    Time 12    Period Weeks    Status New    Target Date 08/07/21      PT LONG TERM GOAL #2   Title Pt will be able to perform lateral scoot transfer without slideboard CGA for improved mobility.    Baseline 05/14/21- Pt still uses slide board and requires PT assist to reposition feet.    Time 12    Period Weeks    Status New    Target Date 08/07/21      PT LONG TERM GOAL #3   Title Pt will be able to stand at counter x 2 min mod assist with PT blocking right leg for improved standing ability/ strength.    Baseline 05/14/21- pt uses standing frame at this time    Time 12    Period Weeks    Status Revised    Target Date 08/07/21      PT LONG TERM GOAL #4   Title Pt will be able to maintain long sit without UE support x 5 min for improved core stability and assist with ADLs.    Time 12    Period Weeks    Status New    Target Date 08/07/21      PT LONG TERM GOAL #5   Title Pt will report being able to perform 20 minutes of manual w/c propulsion around home for improved UE strength and mobility.    Baseline 05/14/21- pt has manual w/c but has yet to use it    Time 12    Period Weeks    Status On-going    Target Date 08/07/21      Additional Long Term Goals   Additional Long Term Goals Yes      PT LONG TERM GOAL #6   Title Pt will be able to perform squat/pivot transfer mod assist of caregiver for improved mobility to uneven surfaces.    Baseline unable    Time 12    Period Weeks    Status New    Target Date 08/07/21                    Plan - 05/26/21 1205     Clinical Impression Statement Pt continues to show improved functional strength with starting to perform some level or downhill transfers without slideboard. Continued to work on w/c mobility and management today. Right arm remains more challenging but did help to try to keep slightly more wrist extension when pushing to protect fingers.    Personal Factors and Comorbidities Comorbidity 2    Comorbidities scoliosis and sleep disorder    Examination-Activity Limitations Bed  Mobility;Locomotion Level;Transfers;Stand;Bathing;Dressing    Examination-Participation Freight forwarder;Yard Work    Merchant navy officer Evolving/Moderate complexity    Rehab Potential Good    PT Frequency 3x / week   plus eval   PT Duration 12 weeks    PT Treatment/Interventions ADLs/Self Care Home Management;Electrical Stimulation;DME Instruction;Neuromuscular re-education;Manual techniques;Therapeutic exercise;Balance training;Therapeutic activities;Cryotherapy;Moist Heat;Functional mobility training;Stair training;Gait training;Patient/family education;Orthotic Fit/Training;Wheelchair mobility training;Dry needling;Passive range of motion;Vestibular    PT Next Visit Plan How are UE exercises in manual w/c going??  Continue slideboard manual w/c <> mat or powerchair and parts management on manual w/c.  Lateral transfer without slideboard powerchair to/from mat for level or downhill, bed mobility. SciFit if has manual chair. Does need supervision to be sure does not tip. Continue to work on functional strengthening for UE, prone position for scapular strengthening. Continue work on long sit with moving legs on mat, balance with less UE support. Tricep dips using yoga blocks to try to get more lift. Utilizing "C" walk to come from sidelying to long sit.  At some point may try bioness to try to get more quad activation with exercises and  possibly with standing frame. Pt does have standing frame at home - have encouraged her to use more daily.    Consulted and Agree with Plan of Care Patient;Family member/caregiver    Family Member Consulted Caregiver - [name]             Patient will benefit from skilled therapeutic intervention in order to improve the following deficits and impairments:  Decreased balance, Decreased mobility, Decreased strength, Impaired sensation, Postural dysfunction, Impaired flexibility, Impaired UE functional use, Impaired tone, Decreased range of motion  Visit Diagnosis: Quadriplegia, C5-C7 incomplete (HCC)  Muscle weakness (generalized)  Abnormal posture     Problem List Patient Active Problem List   Diagnosis Date Noted   Quadriplegia, C5-C7 incomplete ([place]) 01/16/2021   History of spinal fracture 01/16/2021   Suprapubic catheter ([place]) 01/16/2021   Encounter for routine gynecological examination 09/28/2013   Onychomycosis 09/28/2013   Foot deformity, acquired 03/26/2012   Encounter for preventive health examination 12/25/2010   ROSACEA 08/25/2009   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

## 2021-05-26 NOTE — Therapy (Signed)
[facility] [address] [place], [place], [address] Phone: [phone]   Fax:  [phone]  Occupational Therapy Treatment  Patient Details  Name: [name] MRN: [id number] Date of Birth: Oct 17, 1950 Referring Provider (OT): Ina Homes, MD   Encounter Date: 05/26/2021   OT End of Session - 05/26/21 1148     Visit Number 21    Number of Visits 25    Date for OT Re-Evaluation 06/05/21    Authorization Type [employer]    Authorization Time Period Auth Req'd - 25 visits 05/06/21 - 07/19/21    Authorization - Visit Number 21    Authorization - Number of Visits 41   16+25   Progress Note Due on Visit 30    OT Start Time 1150    OT Stop Time 1230    OT Time Calculation (min) 40 min    Activity Tolerance Patient tolerated treatment well    Behavior During Therapy [facility] for tasks assessed/performed             Past Medical History:  Diagnosis Date   CERVICAL POLYP 03/11/2008   Qualifier: Diagnosis of  By: Regis Bill MD, Standley Brooking    Colon polyps 2005   on colonscopy Dr. Fuller Plan   Fibroid 2004   Per Dr. Ouida Sills   History of shingles    face and mouth   Hx of skin cancer, basal cell    Rosacea    Sciatica of left side 09/28/2013   Scoliosis    noted on mri done for back pain    Past Surgical History:  Procedure Laterality Date   BUNIONECTOMY      There were no vitals filed for this visit.   Subjective Assessment - 05/26/21 1151     Subjective  Working on transfers without the SB and brought my manual chair    Pertinent History hyperlipidemia, scoliosis, sleep disorder    Patient Stated Goals regain arm strength for rolling over in bed and fine motor coordination for increasing typing    Pain Score 3     Pain Location Arm    Pain Orientation Right;Left    Pain Descriptors / Indicators Aching;Radiating;Shooting    Pain Type Neuropathic pain    Pain Onset More than a month ago     Pain Frequency Constant              Splints modifications made to short opponens splint.  1 inch blocks with RUE much improved coordination and ability to pick up and drop 1 inch blocks with RUE from last time during task. With short opponens splint on RUE. Worked on placing medium pegs with RUE with mod/max difficulty. Red marks present in web space from splint. Continue to address.  Semi Circle Pegboard RUE  completed white dowels without splint with mod difficulty. Put splint on to complete red dowel with min/mod difficulty. Pt placed gray dowel with LUE with min/mod difficulty. Removed all dowels with splinted RUE.                      OT Short Term Goals - 05/21/21 1103       OT SHORT TERM GOAL #1   Title Pt will be independent with HEP w CG assistance PRN    Time 4    Period Weeks    Status Achieved    Target Date 04/13/21      OT SHORT TERM GOAL #2  Title Pt will verbalize understanding and report independence with CG assistance with ues of modalities at home with good safety.    Baseline has paraffin and Estim unit    Time 4    Period Weeks    Status Achieved   pt has new caregiver she is training for paraffin - reviewed and demonstrated understanding of estim unit     OT SHORT TERM GOAL #3   Title Pt will increase Box and Blocks score with RUE to 30 blocks or greater    Baseline R 26 L 39    Time 4    Period Weeks    Status On-going   R 22 blocks  04/16/21, 5 blocks RUE 05/21/21 d/t finger cramping     OT SHORT TERM GOAL #4   Title Pt will increase BUE tricep strength to 4/5 consistently for increasing ability to perform SB transfers with supervision/set up assistance only.    Baseline 3+/5 strength BUE    Time 4    Period Weeks    Status Achieved      OT SHORT TERM GOAL #5   Title Pt will increase coordination in LUE to completing 9 hole peg test in 70 seconds or less.    Baseline L 75.13s, R unable    Time 4    Period Weeks    Status  Achieved   L 63.87s 04/16/21     OT SHORT TERM GOAL #6   Title Pt will verbalize understanding of adapted strategies and/or equipment for increasing indepenence with ADLs and IADLs (typing, bathing, cutting food, etc)    Baseline has U cuff    Time 4    Period Weeks    Status Achieved   has verbalized understanding of a lot of AE but continues to benefit from education              OT Long Term Goals - 05/21/21 1033       OT LONG TERM GOAL #1   Title Pt will be independence with any updated HEP    Time 12    Period Weeks    Status On-going    Target Date 06/08/21      OT LONG TERM GOAL #2   Title Pt will increase functional use of BUE evidenced by completing Box and Blocks with score of 35 or greater with RUE, 45 or greater with LUE.    Baseline R 26, L 39    Time 12    Period Weeks    Status On-going   have not assessed LUE, decline in RUE d/t finger cramping see STG. 05/21/21     OT LONG TERM GOAL #3   Title PT will improve grip strength in BUE by increasing grip strength to 10 lbs or greater with RUE and 20 lbs or greater with LUE.    Baseline R 1.5, L 12.3    Time 12    Period Weeks    Status On-going   RUE 3.3 lbs, LUE 14.5 lbs     OT LONG TERM GOAL #4   Title Pt will improve isolated finger movements in order to increase skill towards simple typing with adapted strategies and equipment PRN.    Baseline isolated movement in LUE    Time 12    Period Weeks    Status On-going      OT LONG TERM GOAL #5   Title Pt will improve 9 hole peg test in LUE to completing in 65  seconds or less in order to increase functional use and demonstrate ability to place 2 or more pegs with RUE.    Baseline L 75.13s, R unable    Time 12    Period Weeks    Status Partially Met   71.19s LUE 05/21/21, was able to place 2 pegs with RUE 05/21/21     OT LONG TERM GOAL #6   Title Pt will report completing UB and LB dressing with decreased assistance consistently.    Baseline UB (reports  able to do but not consistently doing), LB total A    Time 12    Period Weeks    Status On-going                   Plan - 05/26/21 1235     Clinical Impression Statement Pt experiencing some benefits to short opponens splint on RUE or some fine motor tasks - continue to work on and use for assessing fit.    OT Occupational Profile and History Detailed Assessment- Review of Records and additional review of physical, cognitive, psychosocial history related to current functional performance    Occupational performance deficits (Please refer to evaluation for details): ADL's;IADL's;Leisure;Work;Rest and Sleep    Body Structure / Function / Physical Skills ADL;IADL;ROM;Strength;Decreased knowledge of use of DME;Dexterity;GMC;Pain;Tone;UE functional use;Body mechanics;Balance;Continence;FMC;Muscle spasms;Skin integrity;Flexibility;Mobility;Sensation;Improper spinal/pelvic alignment;Endurance    Rehab Potential Good    Clinical Decision Making Several treatment options, min-mod task modification necessary    Comorbidities Affecting Occupational Performance: May have comorbidities impacting occupational performance    Modification or Assistance to Complete Evaluation  Min-Moderate modification of tasks or assist with assess necessary to complete eval    OT Frequency 2x / week    OT Duration 12 weeks    OT Treatment/Interventions Self-care/ADL training;Moist Heat;Fluidtherapy;DME and/or AE instruction;Splinting;Therapeutic activities;Aquatic Therapy;Ultrasound;Therapeutic exercise;Cognitive remediation/compensation;Passive range of motion;Functional Mobility Training;Neuromuscular education;Electrical Stimulation;Paraffin;Manual Therapy;Patient/family education    Plan mat work with progressing towards LB Dressing, RUE and LUE coordination (RUE< LUE), coordination BUE    Consulted and Agree with Plan of Care Patient;Family member/caregiver    Family Member Consulted spouse [name]              Patient will benefit from skilled therapeutic intervention in order to improve the following deficits and impairments:   Body Structure / Function / Physical Skills: ADL, IADL, ROM, Strength, Decreased knowledge of use of DME, Dexterity, GMC, Pain, Tone, UE functional use, Body mechanics, Balance, Continence, FMC, Muscle spasms, Skin integrity, Flexibility, Mobility, Sensation, Improper spinal/pelvic alignment, Endurance       Visit Diagnosis: Muscle weakness (generalized)  Quadriplegia, C5-C7 incomplete (HCC)  Other abnormalities of gait and mobility  Other lack of coordination  Other symptoms and signs involving the nervous system    Problem List Patient Active Problem List   Diagnosis Date Noted   Quadriplegia, C5-C7 incomplete ([place]) 01/16/2021   History of spinal fracture 01/16/2021   Suprapubic catheter ([place]) 01/16/2021   Encounter for routine gynecological examination 09/28/2013   Onychomycosis 09/28/2013   Foot deformity, acquired 03/26/2012   Encounter for preventive health examination 12/25/2010   ROSACEA 08/25/2009   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

## 2021-05-28 ENCOUNTER — Other Ambulatory Visit: Payer: Self-pay

## 2021-05-28 ENCOUNTER — Encounter: Payer: Self-pay | Admitting: Occupational Therapy

## 2021-05-28 ENCOUNTER — Ambulatory Visit: Payer: Medicare PPO

## 2021-05-28 ENCOUNTER — Ambulatory Visit: Payer: Medicare PPO | Admitting: Occupational Therapy

## 2021-05-28 DIAGNOSIS — G8254 Quadriplegia, C5-C7 incomplete: Secondary | ICD-10-CM

## 2021-05-28 DIAGNOSIS — R278 Other lack of coordination: Secondary | ICD-10-CM

## 2021-05-28 DIAGNOSIS — R208 Other disturbances of skin sensation: Secondary | ICD-10-CM | POA: Diagnosis not present

## 2021-05-28 DIAGNOSIS — M6281 Muscle weakness (generalized): Secondary | ICD-10-CM

## 2021-05-28 DIAGNOSIS — R293 Abnormal posture: Secondary | ICD-10-CM | POA: Diagnosis not present

## 2021-05-28 DIAGNOSIS — R29818 Other symptoms and signs involving the nervous system: Secondary | ICD-10-CM | POA: Diagnosis not present

## 2021-05-28 DIAGNOSIS — R2689 Other abnormalities of gait and mobility: Secondary | ICD-10-CM

## 2021-05-28 NOTE — Therapy (Signed)
[facility] [address] [place], [place], [address] Phone: [phone]   Fax:  [phone] This entire session was performed under the direct supervision and direction of a licensed physical therapist. I have personally read, edited and approve of the note as written.  Cherly Anderson, PT, DPT, NCS 05/28/21    3:16 PM  Physical Therapy Treatment  Patient Details  Name: [name] MRN: [id number] Date of Birth: 01/07/1951 Referring Provider (PT): Landis Gandy   Encounter Date: 05/28/2021   PT End of Session - 05/28/21 1331     Visit Number 29    Number of Visits 39    Date for PT Re-Evaluation 08/07/21    Authorization Type [employer]; new authorization has been requested    Progress Note Due on Visit 30    PT Start Time 1102    PT Stop Time 1146    PT Time Calculation (min) 44 min    Equipment Utilized During Treatment --   slideboard   Activity Tolerance Patient tolerated treatment well    Behavior During Therapy [facility] for tasks assessed/performed             Past Medical History:  Diagnosis Date   CERVICAL POLYP 03/11/2008   Qualifier: Diagnosis of  By: Regis Bill MD, Standley Brooking    Colon polyps 2005   on colonscopy Dr. Fuller Plan   Fibroid 2004   Per Dr. Ouida Sills   History of shingles    face and mouth   Hx of skin cancer, basal cell    Rosacea    Sciatica of left side 09/28/2013   Scoliosis    noted on mri done for back pain    Past Surgical History:  Procedure Laterality Date   BUNIONECTOMY      There were no vitals filed for this visit.   Subjective Assessment - 05/28/21 1106     Subjective Pt reports feeling alright since the last appointment.    Currently in Pain? Yes    Pain Score 4     Pain Location Arm    Pain Orientation Lower    Pain Descriptors / Indicators Stabbing;Radiating;Shooting;Sharp    Pain Onset More than a month ago                                [facility] Adult PT Treatment/Exercise - 05/28/21 1333       Bed Mobility   Bed Mobility Sit to Supine;Supine to Sit    Rolling Right Moderate Assistance - Patient 50-74%   Pt was able to raise LLE w/ leg loop but required moderate assistnace to roll to right as pt was experiencing increased tone today.   Supine to Sit Total Assistance - Patient < 25%   Pt came up from supine to long sit by anchoring to PT's arm and being pulled. Increased difficutly as pt's extensor tone was more prevalent today.   Sit to Supine Moderate Assistance - Patient 50-74%;Maximal Assistance - Patient 25-49%   Pt was able to lift LLE up to mat using leg loop but needed PT total assistance to bring up RLE. PT also provided moderate assistance to shift pt's hips over  to center of mat.     Transfers   Transfers Lateral/Scoot Transfers    Lateral/Scoot Transfers 4: Min assist   Pt only required assistance to position legs   Lateral/Scoot Transfer Details (indicate cue type and  reason) Powerchair to mat w/o use of slideboard. Pt performed a downhill transfer from powechair to mat. Pt required multiple scoots as she was getting caught on her seat. PT was just needed to position feet as instructed by pt. At end of session pt performed a down hill from mat into powerchair w/o use of slideboard. Pt required multiple scoots to perform transfer. PT assisted w/ feet.    Comments PT assisted w/ moving pt's legs today as she presented w/ increased stiffness and extensor tone.      Therapeutic Activites    Therapeutic Activities Other Therapeutic Activities    Other Therapeutic Activities Pt was able to lift LLE from sitting from edge of mat using a leg loop right above the knee. Pt used her head and rocking back diagnoally to the right to be able to achieve this. PT had to provide assisstance to bring up RLE to the mat. PT provided mod assist to help move pt's legs to the center of the mat once in  supine. Pt was not able to roll into right sidelying independently today due to increased stiffness. Pt was able to lift LLE to flexed position w/ PT confirmation that it is high enough. Pt tried to use momentum from UE to roll to the right but was not able to generate enough force to. Pt had to provide moderate assistance to help finish roll. Pt required min assistance from PT to put R elbow under her to help her accomplish c shape. PT provided min guard while pt walked herself forward. Pt was not able to achieve long sitting from c shape independently today due to stiffness. Pt required mod assitance to go up to long sit as she had a hard time keeping weight forward and fell back. Pt reset into c shape position but was limited by fatigue to get back up to long sit and required mod assistance to return to long sit. Pt then performed x5 of practicing sliding RUE from bent to behind her to brace her while moving into long sitting. PT provided min guard and min assist during this activity. PT verbally cued pt to maintain her weight forward to avoid falling back. Instructed to use head to help stay forward more. Pt limited by increased stiffness today.      Exercises   Exercises Other Exercises    Other Exercises  Long sit self hamstring stretch 30 sec x 3 then PT student adding in overpressure for increased stretch 3 x 30 sec.                       PT Short Term Goals - 05/14/21 1416       PT SHORT TERM GOAL #1   Title Pt will be able to perform progressive HEP for stretching and strengthening with caregiver assist including standing frame to continue gains at home.    Baseline PT continues to add to program    Time 4    Period Weeks    Status Revised    Target Date 06/11/21      PT SHORT TERM GOAL #2   Title Pt will be able to perform slideboard transfer on nonlevel surfaces mod I except for assist to reposition feet.    Baseline 05/14/21 close supervision with assisting feet    Time  4    Period Weeks    Status Revised    Target Date 06/11/21      PT SHORT TERM GOAL #  3   Title Pt will be able to roll right consistently with help of left leg loop mod I.    Baseline 05/14/21 currently supervision to min assist depending on fatigue level with leg loop    Time 4    Period Weeks    Status New    Target Date 06/11/21      PT SHORT TERM GOAL #4   Title Pt will transfer supine to long sit utilizing "C" walk technique and leg loop CGA.    Baseline 05/14/21 currently min/mod assist    Time 4    Period Weeks    Status New    Target Date 06/11/21      PT SHORT TERM GOAL #5   Title Pt will be able to propel manual w/c 250' supervision for improved mobility and aerobic conditioning/strengthening.    Baseline just received her manual chair and has not used    Time 4    Period Weeks    Status New    Target Date 06/11/21      Additional Short Term Goals   Additional Short Term Goals Yes      PT SHORT TERM GOAL #6   Title Pt will be able to move legs off table long sit to short sit CGA for improved independence with mobility.    Baseline currently min assist    Time 4    Period Weeks    Status New    Target Date 06/11/21               PT Long Term Goals - 05/14/21 1424       PT LONG TERM GOAL #1   Title Pt will be able to perform short sit to supine transfer utilizing leg loop CGA for improved mobility.    Baseline currently able to get left leg up with loop but max assist for right.    Time 12    Period Weeks    Status New    Target Date 08/07/21      PT LONG TERM GOAL #2   Title Pt will be able to perform lateral scoot transfer without slideboard CGA for improved mobility.    Baseline 05/14/21- Pt still uses slide board and requires PT assist to reposition feet.    Time 12    Period Weeks    Status New    Target Date 08/07/21      PT LONG TERM GOAL #3   Title Pt will be able to stand at counter x 2 min mod assist with PT blocking right leg for  improved standing ability/ strength.    Baseline 05/14/21- pt uses standing frame at this time    Time 12    Period Weeks    Status Revised    Target Date 08/07/21      PT LONG TERM GOAL #4   Title Pt will be able to maintain long sit without UE support x 5 min for improved core stability and assist with ADLs.    Time 12    Period Weeks    Status New    Target Date 08/07/21      PT LONG TERM GOAL #5   Title Pt will report being able to perform 20 minutes of manual w/c propulsion around home for improved UE strength and mobility.    Baseline 05/14/21- pt has manual w/c but has yet to use it    Time 12    Period Weeks  Status On-going    Target Date 08/07/21      Additional Long Term Goals   Additional Long Term Goals Yes      PT LONG TERM GOAL #6   Title Pt will be able to perform squat/pivot transfer mod assist of caregiver for improved mobility to uneven surfaces.    Baseline unable    Time 12    Period Weeks    Status New    Target Date 08/07/21                   Plan - 05/28/21 1416     Clinical Impression Statement Today's PT session focused on continued practice w/ bed mobility.  Pt was limited by increased extensor tone today and required more assistance than usual to perform bed mobility. PT will continue to progress pt as tolerated w/ POC.    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[employer] officer Evolving/Moderate complexity    Rehab Potential Good    PT Frequency 3x / week   plus eval   PT Duration 12 weeks    PT Treatment/Interventions ADLs/Self Care Home Management;Electrical Stimulation;DME Instruction;Neuromuscular re-education;Manual techniques;Therapeutic exercise;Balance  training;Therapeutic activities;Cryotherapy;Moist Heat;Functional mobility training;Stair training;Gait training;Patient/family education;Orthotic Fit/Training;Wheelchair mobility training;Dry needling;Passive range of motion;Vestibular    PT Next Visit Plan 10th visit progress note.  Continue slideboard manual w/c <> mat or powerchair and parts management on manual w/c.  Lateral transfer without slideboard powerchair to/from mat for level or downhill, bed mobility. SciFit if has manual chair. Does need supervision to be sure does not tip. Continue to work on functional strengthening for UE, prone position for scapular strengthening. Continue work on long sit with moving legs on mat, balance with less UE support. Tricep dips using yoga blocks to try to get more lift. Utilizing "C" walk to come from sidelying to long sit.  At some point may try bioness to try to get more quad activation with exercises and possibly with standing frame. Pt does have standing frame at home - have encouraged her to use more daily.    Consulted and Agree with Plan of Care Patient;Family member/caregiver    Family Member Consulted Caregiver - [name]             Patient will benefit from skilled therapeutic intervention in order to improve the following deficits and impairments:  Decreased balance, Decreased mobility, Decreased strength, Impaired sensation, Postural dysfunction, Impaired flexibility, Impaired UE functional use, Impaired tone, Decreased range of motion  Visit Diagnosis: Quadriplegia, C5-C7 incomplete (HCC)  Muscle weakness (generalized)  Other abnormalities of gait and mobility     Problem List Patient Active Problem List   Diagnosis Date Noted   Quadriplegia, C5-C7 incomplete ([facility]) 01/16/2021   History of spinal fracture 01/16/2021   Suprapubic catheter ([place]) 01/16/2021   Encounter for routine gynecological examination 09/28/2013   Onychomycosis 09/28/2013   Foot deformity, acquired 03/26/2012    Encounter for preventive health examination 12/25/2010   ROSACEA 08/25/2009   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 [place], [place], [address] Phone: [phone]   Fax:  [phone]  Name:  Jaclin Finks MRN: [id number] Date of Birth: [birth year]

## 2021-05-28 NOTE — Therapy (Signed)
[facility] [address] [place], [place], [address] Phone: [phone]   Fax:  [phone]  Occupational Therapy Treatment  Patient Details  Name: [name] MRN: [id number] Date of Birth: 1950/07/21 Referring Provider (OT): Ina Homes, MD   Encounter Date: 05/28/2021   OT End of Session - 05/28/21 1152     Visit Number 22    Number of Visits 25    Date for OT Re-Evaluation 06/05/21    Authorization Type [employer]    Authorization Time Period Auth Req'd - 25 visits 05/06/21 - 07/19/21    Authorization - Visit Number 23    Authorization - Number of Visits 41   16+25   Progress Note Due on Visit 30    OT Start Time 1148    OT Stop Time 1230    OT Time Calculation (min) 42 min    Activity Tolerance Patient tolerated treatment well    Behavior During Therapy [facility] for tasks assessed/performed             Past Medical History:  Diagnosis Date   CERVICAL POLYP 03/11/2008   Qualifier: Diagnosis of  By: Regis Bill MD, Standley Brooking    Colon polyps 2005   on colonscopy Dr. Fuller Plan   Fibroid 2004   Per Dr. Ouida Sills   History of shingles    face and mouth   Hx of skin cancer, basal cell    Rosacea    Sciatica of left side 09/28/2013   Scoliosis    noted on mri done for back pain    Past Surgical History:  Procedure Laterality Date   BUNIONECTOMY      There were no vitals filed for this visit.   Subjective Assessment - 05/28/21 1151     Subjective  "We are still trying to get myself to get up on my elbow so I can get myself up but I am not strong enough yet"    Pertinent History hyperlipidemia, scoliosis, sleep disorder    Patient Stated Goals regain arm strength for rolling over in bed and fine motor coordination for increasing typing    Currently in Pain? Yes    Pain Score 4     Pain Location Arm    Pain Orientation Lower    Pain Descriptors / Indicators  Radiating;Shooting;Sharp;Stabbing    Pain Type Neuropathic pain    Pain Onset More than a month ago    Pain Frequency Constant              Hot Pack  to LUE while working on coordination with RUE  RUE stacking checkers chips with min/mod difficulty  Grooved Pegs with LUE with mod difficulty. Removed with RUE with mod difficulty.  Yellow 1.5 lb Digiflex with LUE with power grip x 10 and working on individual finger flexion  Finger Extension with minimal resistance on LUE x 10                      OT Short Term Goals - 05/21/21 1103       OT SHORT TERM GOAL #1   Title Pt will be independent with HEP w CG assistance PRN    Time 4    Period Weeks    Status Achieved    Target Date 04/13/21      OT SHORT TERM GOAL #2   Title Pt will verbalize understanding and report independence with CG assistance with ues of modalities at  home with good safety.    Baseline has paraffin and Estim unit    Time 4    Period Weeks    Status Achieved   pt has new caregiver she is training for paraffin - reviewed and demonstrated understanding of estim unit     OT SHORT TERM GOAL #3   Title Pt will increase Box and Blocks score with RUE to 30 blocks or greater    Baseline R 26 L 39    Time 4    Period Weeks    Status On-going   R 22 blocks  04/16/21, 5 blocks RUE 05/21/21 d/t finger cramping     OT SHORT TERM GOAL #4   Title Pt will increase BUE tricep strength to 4/5 consistently for increasing ability to perform SB transfers with supervision/set up assistance only.    Baseline 3+/5 strength BUE    Time 4    Period Weeks    Status Achieved      OT SHORT TERM GOAL #5   Title Pt will increase coordination in LUE to completing 9 hole peg test in 70 seconds or less.    Baseline L 75.13s, R unable    Time 4    Period Weeks    Status Achieved   L 63.87s 04/16/21     OT SHORT TERM GOAL #6   Title Pt will verbalize understanding of adapted strategies and/or equipment for  increasing indepenence with ADLs and IADLs (typing, bathing, cutting food, etc)    Baseline has U cuff    Time 4    Period Weeks    Status Achieved   has verbalized understanding of a lot of AE but continues to benefit from education              OT Long Term Goals - 05/21/21 1033       OT LONG TERM GOAL #1   Title Pt will be independence with any updated HEP    Time 12    Period Weeks    Status On-going    Target Date 06/08/21      OT LONG TERM GOAL #2   Title Pt will increase functional use of BUE evidenced by completing Box and Blocks with score of 35 or greater with RUE, 45 or greater with LUE.    Baseline R 26, L 39    Time 12    Period Weeks    Status On-going   have not assessed LUE, decline in RUE d/t finger cramping see STG. 05/21/21     OT LONG TERM GOAL #3   Title PT will improve grip strength in BUE by increasing grip strength to 10 lbs or greater with RUE and 20 lbs or greater with LUE.    Baseline R 1.5, L 12.3    Time 12    Period Weeks    Status On-going   RUE 3.3 lbs, LUE 14.5 lbs     OT LONG TERM GOAL #4   Title Pt will improve isolated finger movements in order to increase skill towards simple typing with adapted strategies and equipment PRN.    Baseline isolated movement in LUE    Time 12    Period Weeks    Status On-going      OT LONG TERM GOAL #5   Title Pt will improve 9 hole peg test in LUE to completing in 65 seconds or less in order to increase functional use and demonstrate ability to place 2 or  more pegs with RUE.    Baseline L 75.13s, R unable    Time 12    Period Weeks    Status Partially Met   71.19s LUE 05/21/21, was able to place 2 pegs with RUE 05/21/21     OT LONG TERM GOAL #6   Title Pt will report completing UB and LB dressing with decreased assistance consistently.    Baseline UB (reports able to do but not consistently doing), LB total A    Time 12    Period Weeks    Status On-going                   Plan -  05/28/21 1250     Clinical Impression Statement Pt with increased stiffness today in BUE.    OT Occupational Profile and History Detailed Assessment- Review of Records and additional review of physical, cognitive, psychosocial history related to current functional performance    Occupational performance deficits (Please refer to evaluation for details): ADL's;IADL's;Leisure;Work;Rest and Sleep    Body Structure / Function / Physical Skills ADL;IADL;ROM;Strength;Decreased knowledge of use of DME;Dexterity;GMC;Pain;Tone;UE functional use;Body mechanics;Balance;Continence;FMC;Muscle spasms;Skin integrity;Flexibility;Mobility;Sensation;Improper spinal/pelvic alignment;Endurance    Rehab Potential Good    Clinical Decision Making Several treatment options, min-mod task modification necessary    Comorbidities Affecting Occupational Performance: May have comorbidities impacting occupational performance    Modification or Assistance to Complete Evaluation  Min-Moderate modification of tasks or assist with assess necessary to complete eval    OT Frequency 2x / week    OT Duration 12 weeks    OT Treatment/Interventions Self-care/ADL training;Moist Heat;Fluidtherapy;DME and/or AE instruction;Splinting;Therapeutic activities;Aquatic Therapy;Ultrasound;Therapeutic exercise;Cognitive remediation/compensation;Passive range of motion;Functional Mobility Training;Neuromuscular education;Electrical Stimulation;Paraffin;Manual Therapy;Patient/family education    Plan coordination BUE, bed mobility, LB dressing techniques    Consulted and Agree with Plan of Care Patient;Family member/caregiver    Family Member Consulted spouse [name]             Patient will benefit from skilled therapeutic intervention in order to improve the following deficits and impairments:   Body Structure / Function / Physical Skills: ADL, IADL, ROM, Strength, Decreased knowledge of use of DME, Dexterity, GMC, Pain, Tone, UE functional  use, Body mechanics, Balance, Continence, FMC, Muscle spasms, Skin integrity, Flexibility, Mobility, Sensation, Improper spinal/pelvic alignment, Endurance       Visit Diagnosis: Muscle weakness (generalized)  Other abnormalities of gait and mobility  Other lack of coordination  Quadriplegia, C5-C7 incomplete (HCC)  Other symptoms and signs involving the nervous system  Other disturbances of skin sensation    Problem List Patient Active Problem List   Diagnosis Date Noted   Quadriplegia, C5-C7 incomplete ([facility]) 01/16/2021   History of spinal fracture 01/16/2021   Suprapubic catheter ([place]) 01/16/2021   Encounter for routine gynecological examination 09/28/2013   Onychomycosis 09/28/2013   Foot deformity, acquired 03/26/2012   Encounter for preventive health examination 12/25/2010   ROSACEA 08/25/2009   Disturbance in sleep behavior 03/11/2008   SKIN CANCER, HX OF 03/11/2008   DYSURIA, HX OF 03/11/2008   Hyperlipidemia 02/10/2007   CERVICALGIA 02/10/2007    Zachery Conch, OT 05/28/2021, 12:50 PM  [facility] [address] [place], [place], [address] Phone: [phone]   Fax:  [phone]  Name: Thomasene Dubow MRN: [id number] Date of Birth: July 30, 1950

## 2021-05-29 ENCOUNTER — Ambulatory Visit: Payer: Medicare PPO

## 2021-05-29 DIAGNOSIS — R2689 Other abnormalities of gait and mobility: Secondary | ICD-10-CM

## 2021-05-29 DIAGNOSIS — R293 Abnormal posture: Secondary | ICD-10-CM | POA: Diagnosis not present

## 2021-05-29 DIAGNOSIS — M6281 Muscle weakness (generalized): Secondary | ICD-10-CM | POA: Diagnosis not present

## 2021-05-29 DIAGNOSIS — G8254 Quadriplegia, C5-C7 incomplete: Secondary | ICD-10-CM

## 2021-05-29 DIAGNOSIS — R29818 Other symptoms and signs involving the nervous system: Secondary | ICD-10-CM | POA: Diagnosis not present

## 2021-05-29 DIAGNOSIS — R278 Other lack of coordination: Secondary | ICD-10-CM | POA: Diagnosis not present

## 2021-05-29 DIAGNOSIS — R208 Other disturbances of skin sensation: Secondary | ICD-10-CM | POA: Diagnosis not present

## 2021-05-29 NOTE — Therapy (Signed)
[facility] [address] [place], [place], [address] Phone: [phone]   Fax:  [phone]  Physical Therapy Treatment- Progress Note  Patient Details  Name: [name] MRN: [id number] Date of Birth: 06-28-1950 Referring Provider (PT): Landis Gandy    Progress Note  Reporting period 04/30/21 to 05/29/21  See Note below for Objective Data and Assessment of Progress/Goals   Encounter Date: 05/29/2021   PT End of Session - 05/29/21 1223     Visit Number 30    Number of Visits 61    Date for PT Re-Evaluation 08/07/21    Authorization Type [employer] medicare; new authorization has been requested    Progress Note Due on Visit 30    PT Start Time 1100    PT Stop Time 1145    PT Time Calculation (min) 45 min    Equipment Utilized During Treatment --   slideboard   Activity Tolerance Patient tolerated treatment well    Behavior During Therapy [facility] for tasks assessed/performed             Past Medical History:  Diagnosis Date   CERVICAL POLYP 03/11/2008   Qualifier: Diagnosis of  By: Regis Bill MD, Standley Brooking    Colon polyps 2005   on colonscopy Dr. Fuller Plan   Fibroid 2004   Per Dr. Ouida Sills   History of shingles    face and mouth   Hx of skin cancer, basal cell    Rosacea    Sciatica of left side 09/28/2013   Scoliosis    noted on mri done for back pain    Past Surgical History:  Procedure Laterality Date   BUNIONECTOMY      There were no vitals filed for this visit.   Subjective Assessment - 05/29/21 1103     Subjective Pt reports sleeping well. Pt reports her triceps are a little tender but took a hot shower yesterday that helped.    Currently in Pain? Yes    Pain Score 4     Pain Location Arm    Pain Orientation Right;Left;Lower    Pain Descriptors / Indicators Sharp;Shooting;Aching    Pain Type Chronic pain    Pain Onset More than a month ago                                [facility] Adult PT Treatment/Exercise - 05/29/21 1104       Bed Mobility   Bed Mobility Sit to Supine;Supine to Sit    Rolling Right Minimal Assistance - Patient > 75%   Pt helped set up pt's LLE up into flexion to save time and not having to deal w/ leg loop at end of session. Pt was able to use momentum in arms to roll over but struggled the first time. PT verbally cued pt to push hip forward to assist w/ rolling.   Supine to Sit Total Assistance - Patient < 25%   Pt anchored on to SPTs arm to get from supine into long sit.   Sit to Supine Moderate Assistance - Patient 50-74%   PT assisted w/ pts BLE up on table to save time instead of using leg loop. PT then assisted at pt's hip to scoot hips onto middle of mat.     Transfers   Transfers Lateral/Scoot Transfers    Lateral/Scoot Transfers 4: Min assist    Lateral/Scoot Transfer Details (indicate cue type and  reason) Powerchair to mat w/o use of slideboard. Pt performed a downhill transfer from powechair to mat. Pt required multiple scoots. PT was just needed to position feet as instructed by pt. At end of session pt performed a down hill from mat into powerchair w/o use of slideboard. Pt requiredonly two scoots to perform transfer today. PT assisted w/ feet.    Comments Pt able to assist with sliding legs off mat long sit to short sit with PT unweighting heels.      Therapeutic Activites    Therapeutic Activities Other Therapeutic Activities    Other Therapeutic Activities In short sit at edge of mat pt performed x10 of coming down on forearm and pushing up into extension w/ RUE to simulate coming from c shape to long sit. Pt then performed x5 of walking RUE back into extension w/ straight arm while anchoring self w/ LUE on LLE. Pt required verbal cues to let her know she was far enough. Pt then progressed to performing activity w/ resistance provided by YTB around waist and SPT pulling back 2x5. Pt still needed  cues to tell her she is far enough back. PT had to provide CGA once to keep pt from falling back but pt was able to self-correct most loss of balances. PT verbally cued pt to keep head forward as head determines balance point. Pt then progessed to walking RUE back w/ straight arm in long sitting 2x5. Pt was not able to walk arm as far as in short sit. PT still cued for distance and provided min guard.      Exercises   Exercises Other Exercises    Other Exercises  Long sit self hamstring stretch 30 sec x 3 then PT student adding in overpressure for increased stretch 3 x 30 sec.                       PT Short Term Goals - 05/14/21 1416       PT SHORT TERM GOAL #1   Title Pt will be able to perform progressive HEP for stretching and strengthening with caregiver assist including standing frame to continue gains at home.    Baseline PT continues to add to program    Time 4    Period Weeks    Status Revised    Target Date 06/11/21      PT SHORT TERM GOAL #2   Title Pt will be able to perform slideboard transfer on nonlevel surfaces mod I except for assist to reposition feet.    Baseline 05/14/21 close supervision with assisting feet    Time 4    Period Weeks    Status Revised    Target Date 06/11/21      PT SHORT TERM GOAL #3   Title Pt will be able to roll right consistently with help of left leg loop mod I.    Baseline 05/14/21 currently supervision to min assist depending on fatigue level with leg loop    Time 4    Period Weeks    Status New    Target Date 06/11/21      PT SHORT TERM GOAL #4   Title Pt will transfer supine to long sit utilizing "C" walk technique and leg loop CGA.    Baseline 05/14/21 currently min/mod assist    Time 4    Period Weeks    Status New    Target Date 06/11/21      PT SHORT TERM  GOAL #5   Title Pt will be able to propel manual w/c 250' supervision for improved mobility and aerobic conditioning/strengthening.    Baseline just received  her manual chair and has not used    Time 4    Period Weeks    Status New    Target Date 06/11/21      Additional Short Term Goals   Additional Short Term Goals Yes      PT SHORT TERM GOAL #6   Title Pt will be able to move legs off table long sit to short sit CGA for improved independence with mobility.    Baseline currently min assist    Time 4    Period Weeks    Status New    Target Date 06/11/21               PT Long Term Goals - 05/14/21 1424       PT LONG TERM GOAL #1   Title Pt will be able to perform short sit to supine transfer utilizing leg loop CGA for improved mobility.    Baseline currently able to get left leg up with loop but max assist for right.    Time 12    Period Weeks    Status New    Target Date 08/07/21      PT LONG TERM GOAL #2   Title Pt will be able to perform lateral scoot transfer without slideboard CGA for improved mobility.    Baseline 05/14/21- Pt still uses slide board and requires PT assist to reposition feet.    Time 12    Period Weeks    Status New    Target Date 08/07/21      PT LONG TERM GOAL #3   Title Pt will be able to stand at counter x 2 min mod assist with PT blocking right leg for improved standing ability/ strength.    Baseline 05/14/21- pt uses standing frame at this time    Time 12    Period Weeks    Status Revised    Target Date 08/07/21      PT LONG TERM GOAL #4   Title Pt will be able to maintain long sit without UE support x 5 min for improved core stability and assist with ADLs.    Time 12    Period Weeks    Status New    Target Date 08/07/21      PT LONG TERM GOAL #5   Title Pt will report being able to perform 20 minutes of manual w/c propulsion around home for improved UE strength and mobility.    Baseline 05/14/21- pt has manual w/c but has yet to use it    Time 12    Period Weeks    Status On-going    Target Date 08/07/21      Additional Long Term Goals   Additional Long Term Goals Yes      PT  LONG TERM GOAL #6   Title Pt will be able to perform squat/pivot transfer mod assist of caregiver for improved mobility to uneven surfaces.    Baseline unable    Time 12    Period Weeks    Status New    Target Date 08/07/21                   Plan - 05/29/21 1247     Clinical Impression Statement Today's PT session focused on continuing to break down bed mobility.Pt  is able to transfer from powerchair to mat and back w/o a slideboard. Pt still requires assistance to move BLE. Pt is able to use a leg loop to bring LLE up to table from short sit but requires assistance to bring RLE up to mat. Pt is usually pretty independent w/ rolling using a leg loop depending on tone but often require multiple times to get to sidelying from supine. Pt requires min/mod assist to come up from right sidleying to long sit. PT will continue to progress pt as tolerated w/ POC to address the deficits still present above.    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navy officer Evolving/Moderate complexity    Rehab Potential Good    PT Frequency 3x / week   plus eval   PT Duration 12 weeks    PT Treatment/Interventions ADLs/Self Care Home Management;Electrical Stimulation;DME Instruction;Neuromuscular re-education;Manual techniques;Therapeutic exercise;Balance training;Therapeutic activities;Cryotherapy;Moist Heat;Functional mobility training;Stair training;Gait training;Patient/family education;Orthotic Fit/Training;Wheelchair mobility training;Dry needling;Passive range of motion;Vestibular    PT Next Visit Plan Continue slideboard manual w/c <> mat or powerchair and parts management on manual w/c.  Lateral transfer without slideboard powerchair  to/from mat for level or downhill, bed mobility. SciFit if has manual chair. Does need supervision to be sure does not tip. Continue to work on functional strengthening for UE, prone position for scapular strengthening. Continue work on long sit with moving legs on mat, balance with less UE support. Tricep dips using yoga blocks to try to get more lift. Utilizing "C" walk to come from sidelying to long sit.  At some point may try bioness to try to get more quad activation with exercises and possibly with standing frame. Pt does have standing frame at home - have encouraged her to use more daily.    Consulted and Agree with Plan of Care Patient;Family member/caregiver    Family Member Consulted Caregiver - [name]             Patient will benefit from skilled therapeutic intervention in order to improve the following deficits and impairments:  Decreased balance, Decreased mobility, Decreased strength, Impaired sensation, Postural dysfunction, Impaired flexibility, Impaired UE functional use, Impaired tone, Decreased range of motion  Visit Diagnosis: Muscle weakness (generalized)  Quadriplegia, C5-C7 incomplete ([facility])  Other abnormalities of gait and mobility     Problem List Patient Active Problem List   Diagnosis Date Noted   Quadriplegia, C5-C7 incomplete ([place]) 01/16/2021   History of spinal fracture 01/16/2021   Suprapubic catheter ([place]) 01/16/2021   Encounter for routine gynecological examination 09/28/2013   Onychomycosis 09/28/2013   Foot deformity, acquired 03/26/2012   Encounter for preventive health examination 12/25/2010   ROSACEA 08/25/2009   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 [place], [place], [address] Phone:  [phone]   Fax:  [phone]  Name: [name] MRN: [id number] Date of Birth: September 23, 1950

## 2021-06-01 ENCOUNTER — Other Ambulatory Visit: Payer: Self-pay

## 2021-06-01 ENCOUNTER — Other Ambulatory Visit: Payer: Self-pay | Admitting: Internal Medicine

## 2021-06-01 ENCOUNTER — Ambulatory Visit: Payer: Medicare PPO

## 2021-06-01 DIAGNOSIS — G8254 Quadriplegia, C5-C7 incomplete: Secondary | ICD-10-CM

## 2021-06-01 DIAGNOSIS — M6281 Muscle weakness (generalized): Secondary | ICD-10-CM

## 2021-06-01 DIAGNOSIS — R278 Other lack of coordination: Secondary | ICD-10-CM | POA: Diagnosis not present

## 2021-06-01 DIAGNOSIS — R29818 Other symptoms and signs involving the nervous system: Secondary | ICD-10-CM | POA: Diagnosis not present

## 2021-06-01 DIAGNOSIS — R208 Other disturbances of skin sensation: Secondary | ICD-10-CM | POA: Diagnosis not present

## 2021-06-01 DIAGNOSIS — R293 Abnormal posture: Secondary | ICD-10-CM | POA: Diagnosis not present

## 2021-06-01 DIAGNOSIS — R2689 Other abnormalities of gait and mobility: Secondary | ICD-10-CM | POA: Diagnosis not present

## 2021-06-01 NOTE — Therapy (Signed)
[facility] [address] [place], [place], [address] Phone: [phone]   Fax:  [phone]  Physical Therapy Treatment  Patient Details  Name: [name] MRN: [id number] Date of Birth: Mar 16, 1951 Referring Provider (PT): Landis Gandy   Encounter Date: 06/01/2021   PT End of Session - 06/01/21 1103     Visit Number 31    Number of Visits 60    Date for PT Re-Evaluation 08/07/21    Authorization Type [employer] medicare; 36 visits 12/5-2/24/23    Authorization - Visit Number 6    Authorization - Number of Visits 36    Progress Note Due on Visit 30    PT Start Time 1100    PT Stop Time 1148    PT Time Calculation (min) 48 min    Equipment Utilized During Treatment --    Activity Tolerance Patient tolerated treatment well    Behavior During Therapy [facility] for tasks assessed/performed             Past Medical History:  Diagnosis Date   CERVICAL POLYP 03/11/2008   Qualifier: Diagnosis of  By: Regis Bill MD, Standley Brooking    Colon polyps 2005   on colonscopy Dr. Fuller Plan   Fibroid 2004   Per Dr. Ouida Sills   History of shingles    face and mouth   Hx of skin cancer, basal cell    Rosacea    Sciatica of left side 09/28/2013   Scoliosis    noted on mri done for back pain    Past Surgical History:  Procedure Laterality Date   BUNIONECTOMY      There were no vitals filed for this visit.   Subjective Assessment - 06/01/21 1105     Subjective Pt reports no changes. Pain seems slightly better.    Currently in Pain? Yes    Pain Score 3     Pain Location Arm    Pain Orientation Right;Left    Pain Descriptors / Indicators Sharp;Shooting;Aching    Pain Type Chronic pain;Neuropathic pain    Pain Onset More than a month ago    Pain Frequency Constant                               [facility] Adult PT Treatment/Exercise - 06/01/21 1106       Bed Mobility   Bed Mobility Sit to Supine;Supine to  Sit    Supine to Sit Maximal Assistance - Patient - Patient 25-49%   pulling up on PT arm from supine to long sit.   Sit to Supine Moderate Assistance - Patient 50-74%   pt utilized her leg loop on LLE and leaned back using momentum to get LLE on mat with PT assisting to get RLE on mat.     Transfers   Transfers Lateral/Scoot Transfers    Lateral/Scoot Transfers 4: Min assist    Lateral/Scoot Transfer Details (indicate cue type and reason) powerchair to/from mat. Pt able to perform with level transfer to the mat today and downhill back to w/c. She was able to move LLE off leg rest on own and assist to get it on as well. With scooting along mat pt moved her legs supervision assisting with UE.    Comments Long sit to short sit edge of mat min assist just to off load shoes with pt moving legs 1 at a time to edge of mat with UE assist. Once  feet off pt able to scoot to edge of mat supervision.      Exercises   Exercises Other Exercises    Other Exercises  Long sitting pt performed self hamstring stretch able to reach to shoes now. In long sit with blue physioball behind pt with PT sitting on it for unsteady trunk support: using wooden dowel and red theraband hooked on each end and right active hand glove- chest press 10 x 3, lat pull down 10 x 2, bicep curl 10 x 3 with verbal cues to keep head up.                       PT Short Term Goals - 05/14/21 1416       PT SHORT TERM GOAL #1   Title Pt will be able to perform progressive HEP for stretching and strengthening with caregiver assist including standing frame to continue gains at home.    Baseline PT continues to add to program    Time 4    Period Weeks    Status Revised    Target Date 06/11/21      PT SHORT TERM GOAL #2   Title Pt will be able to perform slideboard transfer on nonlevel surfaces mod I except for assist to reposition feet.    Baseline 05/14/21 close supervision with assisting feet    Time 4    Period Weeks     Status Revised    Target Date 06/11/21      PT SHORT TERM GOAL #3   Title Pt will be able to roll right consistently with help of left leg loop mod I.    Baseline 05/14/21 currently supervision to min assist depending on fatigue level with leg loop    Time 4    Period Weeks    Status New    Target Date 06/11/21      PT SHORT TERM GOAL #4   Title Pt will transfer supine to long sit utilizing "C" walk technique and leg loop CGA.    Baseline 05/14/21 currently min/mod assist    Time 4    Period Weeks    Status New    Target Date 06/11/21      PT SHORT TERM GOAL #5   Title Pt will be able to propel manual w/c 250' supervision for improved mobility and aerobic conditioning/strengthening.    Baseline just received her manual chair and has not used    Time 4    Period Weeks    Status New    Target Date 06/11/21      Additional Short Term Goals   Additional Short Term Goals Yes      PT SHORT TERM GOAL #6   Title Pt will be able to move legs off table long sit to short sit CGA for improved independence with mobility.    Baseline currently min assist    Time 4    Period Weeks    Status New    Target Date 06/11/21               PT Long Term Goals - 05/14/21 1424       PT LONG TERM GOAL #1   Title Pt will be able to perform short sit to supine transfer utilizing leg loop CGA for improved mobility.    Baseline currently able to get left leg up with loop but max assist for right.    Time 12  Period Weeks    Status New    Target Date 08/07/21      PT LONG TERM GOAL #2   Title Pt will be able to perform lateral scoot transfer without slideboard CGA for improved mobility.    Baseline 05/14/21- Pt still uses slide board and requires PT assist to reposition feet.    Time 12    Period Weeks    Status New    Target Date 08/07/21      PT LONG TERM GOAL #3   Title Pt will be able to stand at counter x 2 min mod assist with PT blocking right leg for improved standing ability/  strength.    Baseline 05/14/21- pt uses standing frame at this time    Time 12    Period Weeks    Status Revised    Target Date 08/07/21      PT LONG TERM GOAL #4   Title Pt will be able to maintain long sit without UE support x 5 min for improved core stability and assist with ADLs.    Time 12    Period Weeks    Status New    Target Date 08/07/21      PT LONG TERM GOAL #5   Title Pt will report being able to perform 20 minutes of manual w/c propulsion around home for improved UE strength and mobility.    Baseline 05/14/21- pt has manual w/c but has yet to use it    Time 12    Period Weeks    Status On-going    Target Date 08/07/21      Additional Long Term Goals   Additional Long Term Goals Yes      PT LONG TERM GOAL #6   Title Pt will be able to perform squat/pivot transfer mod assist of caregiver for improved mobility to uneven surfaces.    Baseline unable    Time 12    Period Weeks    Status New    Target Date 08/07/21                   Plan - 06/01/21 1335     Clinical Impression Statement Pt continues to demonstrate improving UE functional strength with being able to perform lateral transfer on level and downhill surfaces without slideboard. Pt requiring less assistance to move her legs with functional mobility.    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navy officer Evolving/Moderate complexity    Rehab Potential Good    PT Frequency 3x / week   plus eval   PT Duration 12 weeks    PT Treatment/Interventions ADLs/Self Care Home Management;Electrical Stimulation;DME Instruction;Neuromuscular re-education;Manual techniques;Therapeutic exercise;Balance training;Therapeutic  activities;Cryotherapy;Moist Heat;Functional mobility training;Stair training;Gait training;Patient/family education;Orthotic Fit/Training;Wheelchair mobility training;Dry needling;Passive range of motion;Vestibular    PT Next Visit Plan Next visit will focus on LE strengthening more. Continue slideboard manual w/c <> mat or powerchair and parts management on manual w/c.  Lateral transfer without slideboard powerchair to/from mat for level or downhill, bed mobility. SciFit if has manual chair. Does need supervision to be sure does not tip. Continue to work on functional strengthening for UE, prone position for scapular strengthening. Continue work on long sit with moving legs on mat, balance with less UE support. Tricep dips using yoga blocks to try to get more  lift. Utilizing "C" walk to come from sidelying to long sit.  At some point may try bioness to try to get more quad activation with exercises and possibly with standing frame. Pt does have standing frame at home - have encouraged her to use more daily.    Consulted and Agree with Plan of Care Patient;Family member/caregiver    Family Member Consulted Caregiver - [name]             Patient will benefit from skilled therapeutic intervention in order to improve the following deficits and impairments:  Decreased balance, Decreased mobility, Decreased strength, Impaired sensation, Postural dysfunction, Impaired flexibility, Impaired UE functional use, Impaired tone, Decreased range of motion  Visit Diagnosis: Quadriplegia, C5-C7 incomplete ([place])  Muscle weakness (generalized)     Problem List Patient Active Problem List   Diagnosis Date Noted   Quadriplegia, C5-C7 incomplete ([place]) 01/16/2021   History of spinal fracture 01/16/2021   Suprapubic catheter ([place]) 01/16/2021   Encounter for routine gynecological examination 09/28/2013   Onychomycosis 09/28/2013   Foot deformity, acquired 03/26/2012   Encounter for preventive health  examination 12/25/2010   ROSACEA 08/25/2009   Disturbance in sleep behavior 03/11/2008   SKIN CANCER, HX OF 03/11/2008   DYSURIA, HX OF 03/11/2008   Hyperlipidemia 02/10/2007   CERVICALGIA 02/10/2007    Electa Sniff, PT, DPT, NCS 06/01/2021, 1:37 PM  [facility] [address] [place], [place], [address] Phone: [phone]   Fax:  [phone]  Name: [name] MRN: [id number] Date of Birth: April 18, 1951

## 2021-06-02 ENCOUNTER — Ambulatory Visit: Payer: Medicare PPO | Admitting: Occupational Therapy

## 2021-06-02 ENCOUNTER — Ambulatory Visit: Payer: Medicare PPO

## 2021-06-02 ENCOUNTER — Encounter: Payer: Self-pay | Admitting: Occupational Therapy

## 2021-06-02 DIAGNOSIS — G8254 Quadriplegia, C5-C7 incomplete: Secondary | ICD-10-CM | POA: Diagnosis not present

## 2021-06-02 DIAGNOSIS — R278 Other lack of coordination: Secondary | ICD-10-CM | POA: Diagnosis not present

## 2021-06-02 DIAGNOSIS — R2689 Other abnormalities of gait and mobility: Secondary | ICD-10-CM

## 2021-06-02 DIAGNOSIS — R208 Other disturbances of skin sensation: Secondary | ICD-10-CM | POA: Diagnosis not present

## 2021-06-02 DIAGNOSIS — R29818 Other symptoms and signs involving the nervous system: Secondary | ICD-10-CM

## 2021-06-02 DIAGNOSIS — M6281 Muscle weakness (generalized): Secondary | ICD-10-CM | POA: Diagnosis not present

## 2021-06-02 DIAGNOSIS — N312 Flaccid neuropathic bladder, not elsewhere classified: Secondary | ICD-10-CM | POA: Diagnosis not present

## 2021-06-02 DIAGNOSIS — R293 Abnormal posture: Secondary | ICD-10-CM | POA: Diagnosis not present

## 2021-06-02 NOTE — Patient Instructions (Signed)
Opposition (Active)   Touch tip of thumb to nail tip of each finger in turn, making an "O" shape. Repeat __10__ times. Do _4-6___ sessions per day.   MP Flexion (Active)   Bend thumb to touch base of little finger, keeping tip joint straight. Repeat __10-15__ times. Do _4-6___ sessions per day.       IP Flexion (Active Blocked)   Brace thumb below tip joint. Bend joint as far as possible. Repeat __10__ times. Do _4-6___ sessions per day.   Composite Extension (Active)   Bring thumb up and out in hitchhiker position.  Repeat __10-15__ times. Do _4-6___ sessions per day.

## 2021-06-02 NOTE — Therapy (Signed)
[facility] [address] [place], [place], [address] Phone: [phone]   Fax:  [phone]  Occupational Therapy Treatment  Patient Details  Name: [name] MRN: [id number] Date of Birth: 17-Jan-1951 Referring Provider (OT): Ina Homes, MD   Encounter Date: 06/02/2021   OT End of Session - 06/02/21 1203     Visit Number 23    Number of Visits 25    Date for OT Re-Evaluation 06/05/21    Authorization Type [employer]    Authorization Time Period Auth Req'd - 25 visits 05/06/21 - 07/19/21    Authorization - Visit Number 49    Authorization - Number of Visits 41   16+25   Progress Note Due on Visit 30    OT Start Time 1145    OT Stop Time 1230    OT Time Calculation (min) 45 min    Activity Tolerance Patient tolerated treatment well    Behavior During Therapy [facility] for tasks assessed/performed             Past Medical History:  Diagnosis Date   CERVICAL POLYP 03/11/2008   Qualifier: Diagnosis of  By: Regis Bill MD, Standley Brooking    Colon polyps 2005   on colonscopy Dr. Fuller Plan   Fibroid 2004   Per Dr. Ouida Sills   History of shingles    face and mouth   Hx of skin cancer, basal cell    Rosacea    Sciatica of left side 09/28/2013   Scoliosis    noted on mri done for back pain    Past Surgical History:  Procedure Laterality Date   BUNIONECTOMY      There were no vitals filed for this visit.   Subjective Assessment - 06/02/21 1155     Subjective  I was practicing my connect four chips and stacking them    Patient is accompanied by: --   caregiver   Pertinent History hyperlipidemia, scoliosis, sleep disorder    Patient Stated Goals regain arm strength for rolling over in bed and fine motor coordination for increasing typing    Currently in Pain? Yes    Pain Score 4     Pain Location Arm    Pain Orientation Right;Left    Pain Descriptors / Indicators Aching;Sharp;Shooting    Pain  Type Chronic pain;Neuropathic pain    Pain Onset More than a month ago    Pain Frequency Constant              Coordination with RUE stacking connect four chips with short opponens splint with increase coordination. Pt's index finger continues to get tighter with use but pt with increased ability to stack today with splint. Pt did have red marks so padding was added to splint with no success - pt continues with indentions and limited opposition with thumb with increased padding. Continue to address. Did not send home.  Coordination with RUE with placing pennies into bank with splint with good coordination.  AROM thumb. See pt instructions.                    OT Education - 06/02/21 1444     Education Details AROM thumb    Person(s) Educated Patient;Caregiver(s)    Methods Handout;Explanation;Demonstration    Comprehension Verbalized understanding;Returned demonstration              OT Short Term Goals - 05/21/21 1103       OT SHORT TERM GOAL #  1   Title Pt will be independent with HEP w CG assistance PRN    Time 4    Period Weeks    Status Achieved    Target Date 04/13/21      OT SHORT TERM GOAL #2   Title Pt will verbalize understanding and report independence with CG assistance with ues of modalities at home with good safety.    Baseline has paraffin and Estim unit    Time 4    Period Weeks    Status Achieved   pt has new caregiver she is training for paraffin - reviewed and demonstrated understanding of estim unit     OT SHORT TERM GOAL #3   Title Pt will increase Box and Blocks score with RUE to 30 blocks or greater    Baseline R 26 L 39    Time 4    Period Weeks    Status On-going   R 22 blocks  04/16/21, 5 blocks RUE 05/21/21 d/t finger cramping     OT SHORT TERM GOAL #4   Title Pt will increase BUE tricep strength to 4/5 consistently for increasing ability to perform SB transfers with supervision/set up assistance only.    Baseline 3+/5  strength BUE    Time 4    Period Weeks    Status Achieved      OT SHORT TERM GOAL #5   Title Pt will increase coordination in LUE to completing 9 hole peg test in 70 seconds or less.    Baseline L 75.13s, R unable    Time 4    Period Weeks    Status Achieved   L 63.87s 04/16/21     OT SHORT TERM GOAL #6   Title Pt will verbalize understanding of adapted strategies and/or equipment for increasing indepenence with ADLs and IADLs (typing, bathing, cutting food, etc)    Baseline has U cuff    Time 4    Period Weeks    Status Achieved   has verbalized understanding of a lot of AE but continues to benefit from education              OT Long Term Goals - 05/21/21 1033       OT LONG TERM GOAL #1   Title Pt will be independence with any updated HEP    Time 12    Period Weeks    Status On-going    Target Date 06/08/21      OT LONG TERM GOAL #2   Title Pt will increase functional use of BUE evidenced by completing Box and Blocks with score of 35 or greater with RUE, 45 or greater with LUE.    Baseline R 26, L 39    Time 12    Period Weeks    Status On-going   have not assessed LUE, decline in RUE d/t finger cramping see STG. 05/21/21     OT LONG TERM GOAL #3   Title PT will improve grip strength in BUE by increasing grip strength to 10 lbs or greater with RUE and 20 lbs or greater with LUE.    Baseline R 1.5, L 12.3    Time 12    Period Weeks    Status On-going   RUE 3.3 lbs, LUE 14.5 lbs     OT LONG TERM GOAL #4   Title Pt will improve isolated finger movements in order to increase skill towards simple typing with adapted strategies and equipment PRN.  Baseline isolated movement in LUE    Time 12    Period Weeks    Status On-going      OT LONG TERM GOAL #5   Title Pt will improve 9 hole peg test in LUE to completing in 65 seconds or less in order to increase functional use and demonstrate ability to place 2 or more pegs with RUE.    Baseline L 75.13s, R unable     Time 12    Period Weeks    Status Partially Met   71.19s LUE 05/21/21, was able to place 2 pegs with RUE 05/21/21     OT LONG TERM GOAL #6   Title Pt will report completing UB and LB dressing with decreased assistance consistently.    Baseline UB (reports able to do but not consistently doing), LB total A    Time 12    Period Weeks    Status On-going                   Plan - 06/02/21 1204     Clinical Impression Statement Pt continues to have tightness in index finger of RUE. Pt reports doing HEPs consistently.    OT Occupational Profile and History Detailed Assessment- Review of Records and additional review of physical, cognitive, psychosocial history related to current functional performance    Occupational performance deficits (Please refer to evaluation for details): ADL's;IADL's;Leisure;Work;Rest and Sleep    Body Structure / Function / Physical Skills ADL;IADL;ROM;Strength;Decreased knowledge of use of DME;Dexterity;GMC;Pain;Tone;UE functional use;Body mechanics;Balance;Continence;FMC;Muscle spasms;Skin integrity;Flexibility;Mobility;Sensation;Improper spinal/pelvic alignment;Endurance    Rehab Potential Good    Clinical Decision Making Several treatment options, min-mod task modification necessary    Comorbidities Affecting Occupational Performance: May have comorbidities impacting occupational performance    Modification or Assistance to Complete Evaluation  Min-Moderate modification of tasks or assist with assess necessary to complete eval    OT Frequency 2x / week    OT Duration 12 weeks    OT Treatment/Interventions Self-care/ADL training;Moist Heat;Fluidtherapy;DME and/or AE instruction;Splinting;Therapeutic activities;Aquatic Therapy;Ultrasound;Therapeutic exercise;Cognitive remediation/compensation;Passive range of motion;Functional Mobility Training;Neuromuscular education;Electrical Stimulation;Paraffin;Manual Therapy;Patient/family education    Plan coordination  BUE, bed mobility, LB dressing techniques, recertification next visit    Consulted and Agree with Plan of Care Patient;Family member/caregiver    Family Member Consulted spouse [name]             Patient will benefit from skilled therapeutic intervention in order to improve the following deficits and impairments:   Body Structure / Function / Physical Skills: ADL, IADL, ROM, Strength, Decreased knowledge of use of DME, Dexterity, GMC, Pain, Tone, UE functional use, Body mechanics, Balance, Continence, FMC, Muscle spasms, Skin integrity, Flexibility, Mobility, Sensation, Improper spinal/pelvic alignment, Endurance       Visit Diagnosis: Quadriplegia, C5-C7 incomplete (HCC)  Muscle weakness (generalized)  Other abnormalities of gait and mobility  Other lack of coordination  Other symptoms and signs involving the nervous system    Problem List Patient Active Problem List   Diagnosis Date Noted   Quadriplegia, C5-C7 incomplete ([place]) 01/16/2021   History of spinal fracture 01/16/2021   Suprapubic catheter ([place]) 01/16/2021   Encounter for routine gynecological examination 09/28/2013   Onychomycosis 09/28/2013   Foot deformity, acquired 03/26/2012   Encounter for preventive health examination 12/25/2010   ROSACEA 08/25/2009   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 [place], [place], [address] Phone: [phone]   Fax:  [phone]  Name: [name] MRN: [id number] Date of Birth: May 07, 1951

## 2021-06-02 NOTE — Therapy (Signed)
[facility] [address] [place], [place], [address] Phone: [phone]   Fax:  [phone]  Physical Therapy Treatment  Patient Details  Name: [name] MRN: [id number] Date of Birth: May 05, 1951 Referring Provider (PT): Landis Gandy   Encounter Date: 06/02/2021   PT End of Session - 06/02/21 1103     Visit Number 32    Number of Visits 20    Date for PT Re-Evaluation 08/07/21    Authorization Type [employer]; 36 visits 12/5-2/24/23    Authorization - Visit Number 7    Authorization - Number of Visits 36    Progress Note Due on Visit 30    PT Start Time 1100    PT Stop Time 1145    PT Time Calculation (min) 45 min    Activity Tolerance Patient tolerated treatment well    Behavior During Therapy [facility] for tasks assessed/performed             Past Medical History:  Diagnosis Date   CERVICAL POLYP 03/11/2008   Qualifier: Diagnosis of  By: Regis Bill MD, Standley Brooking    Colon polyps 2005   on colonscopy Dr. Fuller Plan   Fibroid 2004   Per Dr. Ouida Sills   History of shingles    face and mouth   Hx of skin cancer, basal cell    Rosacea    Sciatica of left side 09/28/2013   Scoliosis    noted on mri done for back pain    Past Surgical History:  Procedure Laterality Date   BUNIONECTOMY      There were no vitals filed for this visit.   Subjective Assessment - 06/02/21 1103     Subjective Pt denies any changes. Is going to [place] tomorrow for 2 weeks.    Currently in Pain? Yes    Pain Score 4     Pain Location Arm    Pain Orientation Right    Pain Descriptors / Indicators Sharp;Shooting;Aching    Pain Type Chronic pain;Neuropathic pain    Pain Onset More than a month ago    Pain Frequency Constant                               [facility] Adult PT Treatment/Exercise - 06/02/21 1104       Transfers   Transfers Lateral/Scoot Transfers    Lateral/Scoot Transfers 4: Min  assist    Lateral/Scoot Transfer Details (indicate cue type and reason) powerchair to/from mat. Pt able to perform with level transfer to the mat today and downhill back to w/c. She was able to move LLE off leg rest on own and assist to get it on as well. PT cued her to kick out left leg and then move over to leg rest and pull back. Used arms to assist RLE some but still needed PT min assist on right.      Exercises   Exercises Other Exercises    Other Exercises  Seated edge of mat: LAQ on left 8 x 3 through most of range. Cued to try to control descent as well and hold for a couple seconds at top. For RLE placed half foam roll under thigh to allow foot to float and worked on trying to get some initiation. Trace movement noted. Hamstring curl with yellow theraband resistance on left 10 x 3, RLE on foam roll with slight movement 5 x 2. Hip abd/adduction 10 x  2 on left and 5 x 2 on right with PT assisting to abduct and partial motion for adduction. LLE hip abd/add with having pt light leg some for some added flexion x 10 then seated left hip flexion x 10 through partial range lifting foot off ground. Seated with LLE on 2" step working on stepping off to side and back on to simulate on/off foot plate 5 x 2. Pt had to wiggle foot some to edge at  times to start. With return to step cued to kick foot out some and then set on step and pull back.                       PT Short Term Goals - 05/14/21 1416       PT SHORT TERM GOAL #1   Title Pt will be able to perform progressive HEP for stretching and strengthening with caregiver assist including standing frame to continue gains at home.    Baseline PT continues to add to program    Time 4    Period Weeks    Status Revised    Target Date 06/11/21      PT SHORT TERM GOAL #2   Title Pt will be able to perform slideboard transfer on nonlevel surfaces mod I except for assist to reposition feet.    Baseline 05/14/21 close supervision with  assisting feet    Time 4    Period Weeks    Status Revised    Target Date 06/11/21      PT SHORT TERM GOAL #3   Title Pt will be able to roll right consistently with help of left leg loop mod I.    Baseline 05/14/21 currently supervision to min assist depending on fatigue level with leg loop    Time 4    Period Weeks    Status New    Target Date 06/11/21      PT SHORT TERM GOAL #4   Title Pt will transfer supine to long sit utilizing "C" walk technique and leg loop CGA.    Baseline 05/14/21 currently min/mod assist    Time 4    Period Weeks    Status New    Target Date 06/11/21      PT SHORT TERM GOAL #5   Title Pt will be able to propel manual w/c 250' supervision for improved mobility and aerobic conditioning/strengthening.    Baseline just received her manual chair and has not used    Time 4    Period Weeks    Status New    Target Date 06/11/21      Additional Short Term Goals   Additional Short Term Goals Yes      PT SHORT TERM GOAL #6   Title Pt will be able to move legs off table long sit to short sit CGA for improved independence with mobility.    Baseline currently min assist    Time 4    Period Weeks    Status New    Target Date 06/11/21               PT Long Term Goals - 05/14/21 1424       PT LONG TERM GOAL #1   Title Pt will be able to perform short sit to supine transfer utilizing leg loop CGA for improved mobility.    Baseline currently able to get left leg up with loop but max assist for right.  Time 12    Period Weeks    Status New    Target Date 08/07/21      PT LONG TERM GOAL #2   Title Pt will be able to perform lateral scoot transfer without slideboard CGA for improved mobility.    Baseline 05/14/21- Pt still uses slide board and requires PT assist to reposition feet.    Time 12    Period Weeks    Status New    Target Date 08/07/21      PT LONG TERM GOAL #3   Title Pt will be able to stand at counter x 2 min mod assist with PT  blocking right leg for improved standing ability/ strength.    Baseline 05/14/21- pt uses standing frame at this time    Time 12    Period Weeks    Status Revised    Target Date 08/07/21      PT LONG TERM GOAL #4   Title Pt will be able to maintain long sit without UE support x 5 min for improved core stability and assist with ADLs.    Time 12    Period Weeks    Status New    Target Date 08/07/21      PT LONG TERM GOAL #5   Title Pt will report being able to perform 20 minutes of manual w/c propulsion around home for improved UE strength and mobility.    Baseline 05/14/21- pt has manual w/c but has yet to use it    Time 12    Period Weeks    Status On-going    Target Date 08/07/21      Additional Long Term Goals   Additional Long Term Goals Yes      PT LONG TERM GOAL #6   Title Pt will be able to perform squat/pivot transfer mod assist of caregiver for improved mobility to uneven surfaces.    Baseline unable    Time 12    Period Weeks    Status New    Target Date 08/07/21                   Plan - 06/02/21 1938     Clinical Impression Statement Session focused on BLE strengthening today. Pt continues to show improved strength in LLE. RLE has limited movement mostly trace. Pt was able to demonstrate carryover through session with requiring less assistance to reposition legs with lateral transfers.    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 officer Evolving/Moderate complexity    Rehab Potential Good    PT Frequency 3x / week   plus eval   PT Duration 12 weeks    PT Treatment/Interventions ADLs/Self Care Home Management;Electrical Stimulation;DME Instruction;Neuromuscular re-education;Manual  techniques;Therapeutic exercise;Balance training;Therapeutic activities;Cryotherapy;Moist Heat;Functional mobility training;Stair training;Gait training;Patient/family education;Orthotic Fit/Training;Wheelchair mobility training;Dry needling;Passive range of motion;Vestibular    PT Next Visit Plan How was her trip to [place]? STG check will be due when gets back. Continue slideboard manual w/c <> mat or powerchair and parts management on manual w/c.  Lateral transfer without slideboard powerchair to/from mat for level or downhill, bed mobility. SciFit if has manual chair. Does need supervision to be sure does not tip. Continue to work on functional strengthening for UE, prone position for scapular strengthening. Continue work on long sit with moving legs on  mat, balance with less UE support. Tricep dips using yoga blocks to try to get more lift. Utilizing "C" walk to come from sidelying to long sit.  At some point may try bioness to try to get more quad activation with exercises and possibly with standing frame. Pt does have standing frame at home - have encouraged her to use more daily.    Consulted and Agree with Plan of Care Patient;Family member/caregiver    Family Member Consulted Caregiver - [name]             Patient will benefit from skilled therapeutic intervention in order to improve the following deficits and impairments:  Decreased balance, Decreased mobility, Decreased strength, Impaired sensation, Postural dysfunction, Impaired flexibility, Impaired UE functional use, Impaired tone, Decreased range of motion  Visit Diagnosis: Muscle weakness (generalized)  Quadriplegia, C5-C7 incomplete ([place])     Problem List Patient Active Problem List   Diagnosis Date Noted   Quadriplegia, C5-C7 incomplete ([place]) 01/16/2021   History of spinal fracture 01/16/2021   Suprapubic catheter ([place]) 01/16/2021   Encounter for routine gynecological examination 09/28/2013   Onychomycosis 09/28/2013    Foot deformity, acquired 03/26/2012   Encounter for preventive health examination 12/25/2010   ROSACEA 08/25/2009   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 [place], [place], [address] Phone: [phone]   Fax:  [phone]  Name: Lewanna Petrak MRN: [id number] Date of Birth: January 16, 1951

## 2021-06-04 ENCOUNTER — Ambulatory Visit: Payer: Medicare PPO

## 2021-06-04 ENCOUNTER — Ambulatory Visit: Payer: Medicare PPO | Admitting: Occupational Therapy

## 2021-06-11 ENCOUNTER — Ambulatory Visit: Payer: Medicare PPO

## 2021-06-11 ENCOUNTER — Encounter: Payer: Medicare PPO | Admitting: Occupational Therapy

## 2021-06-12 ENCOUNTER — Ambulatory Visit: Payer: Medicare PPO

## 2021-06-16 ENCOUNTER — Encounter: Payer: Self-pay | Admitting: Internal Medicine

## 2021-06-16 ENCOUNTER — Ambulatory Visit: Payer: Medicare PPO | Admitting: Occupational Therapy

## 2021-06-16 ENCOUNTER — Encounter: Payer: Self-pay | Admitting: Occupational Therapy

## 2021-06-16 ENCOUNTER — Other Ambulatory Visit: Payer: Self-pay

## 2021-06-16 ENCOUNTER — Ambulatory Visit: Payer: Medicare PPO | Attending: Physical Medicine and Rehabilitation | Admitting: Physical Therapy

## 2021-06-16 DIAGNOSIS — R208 Other disturbances of skin sensation: Secondary | ICD-10-CM

## 2021-06-16 DIAGNOSIS — R29818 Other symptoms and signs involving the nervous system: Secondary | ICD-10-CM

## 2021-06-16 DIAGNOSIS — G8254 Quadriplegia, C5-C7 incomplete: Secondary | ICD-10-CM | POA: Insufficient documentation

## 2021-06-16 DIAGNOSIS — M6281 Muscle weakness (generalized): Secondary | ICD-10-CM | POA: Insufficient documentation

## 2021-06-16 DIAGNOSIS — R293 Abnormal posture: Secondary | ICD-10-CM | POA: Diagnosis not present

## 2021-06-16 DIAGNOSIS — R2689 Other abnormalities of gait and mobility: Secondary | ICD-10-CM | POA: Diagnosis not present

## 2021-06-16 DIAGNOSIS — R278 Other lack of coordination: Secondary | ICD-10-CM | POA: Insufficient documentation

## 2021-06-16 MED ORDER — NORTRIPTYLINE HCL 25 MG PO CAPS: 50.0000 mg | ORAL_CAPSULE | Freq: Every day | ORAL | 1 refills | Status: DC

## 2021-06-16 MED ORDER — GABAPENTIN 600 MG PO TABS: 1200.0000 mg | ORAL_TABLET | Freq: Three times a day (TID) | ORAL | 1 refills | Status: DC

## 2021-06-16 NOTE — Telephone Encounter (Signed)
Please refill 6 months worth

## 2021-06-16 NOTE — Therapy (Signed)
[facility] [address] [place], [place], [address] Phone: [phone]   Fax:  [phone]  Occupational Therapy Treatment & Recertification  Patient Details  Name: [name] MRN: [id number] Date of Birth: [birth year] Referring Provider (OT): Ina Homes, MD   Encounter Date: 06/16/2021   OT End of Session - 06/16/21 1409     Visit Number 24    Number of Visits 48   +24 visits at renewal 06/16/21   Date for OT Re-Evaluation 06/05/21    Authorization Type [employer]    Authorization Time Period Auth Req'd - 25 visits 05/06/21 - 07/19/21    Authorization - Visit Number 24    Authorization - Number of Visits 41   16+25   Progress Note Due on Visit 34    OT Start Time 1400    OT Stop Time 1445    OT Time Calculation (min) 45 min    Activity Tolerance Patient tolerated treatment well    Behavior During Therapy [facility] for tasks assessed/performed             Past Medical History:  Diagnosis Date   CERVICAL POLYP 03/11/2008   Qualifier: Diagnosis of  By: Regis Bill MD, Standley Brooking    Colon polyps 2005   on colonscopy Dr. Fuller Plan   Fibroid 2004   Per Dr. Ouida Sills   History of shingles    face and mouth   Hx of skin cancer, basal cell    Rosacea    Sciatica of left side 09/28/2013   Scoliosis    noted on mri done for back pain    Past Surgical History:  Procedure Laterality Date   BUNIONECTOMY      There were no vitals filed for this visit.   Subjective Assessment - 06/16/21 1405     Subjective  I was practicing my connect four chips and stacking them    Patient is accompanied by: --   caregiver   Pertinent History hyperlipidemia, scoliosis, sleep disorder    Patient Stated Goals regain arm strength for rolling over in bed and fine motor coordination for increasing typing    Currently in Pain? Yes    Pain Score 4     Pain Location Arm    Pain Orientation Left;Right    Pain Descriptors /  Indicators Aching;Sharp    Pain Type Chronic pain;Neuropathic pain    Pain Onset More than a month ago    Pain Frequency Constant               Medium Peg board - pt with much improvement and completed entire pattern with RUE today. Pt with moderate drops and difficulty.   Box and Blocks assessed and determined different barriers that are contributing to cramping of index finger of RUE during assessment.                     OT Short Term Goals - 06/16/21 1559       OT SHORT TERM GOAL #1   Title Pt will be independent with HEP w CG assistance PRN    Time 4    Period Weeks    Status Achieved    Target Date 04/13/21      OT SHORT TERM GOAL #2   Title Pt will verbalize understanding and report independence with CG assistance with ues of modalities at home with good safety.    Baseline has paraffin and Estim unit  Time 4    Period Weeks    Status Achieved   pt has new caregiver she is training for paraffin - reviewed and demonstrated understanding of estim unit     OT SHORT TERM GOAL #3   Title Pt will increase Box and Blocks score with RUE to 30 blocks or greater    Baseline R 26 L 39    Time 4    Period Weeks    Status Deferred   R 22 blocks  04/16/21, 5 blocks RUE 05/21/21 d/t finger cramping, 15 blocks 06/16/21 - deferred to updated LTG     OT SHORT TERM GOAL #4   Title Pt will increase BUE tricep strength to 4/5 consistently for increasing ability to perform SB transfers with supervision/set up assistance only.    Baseline 3+/5 strength BUE    Time 4    Period Weeks    Status Achieved      OT SHORT TERM GOAL #5   Title Pt will increase coordination in LUE to completing 9 hole peg test in 70 seconds or less.    Baseline L 75.13s, R unable    Time 4    Period Weeks    Status Achieved   L 63.87s 04/16/21     OT SHORT TERM GOAL #6   Title Pt will verbalize understanding of adapted strategies and/or equipment for increasing indepenence with ADLs and  IADLs (typing, bathing, cutting food, etc)    Baseline has U cuff    Time 4    Period Weeks    Status Achieved   has verbalized understanding of a lot of AE but continues to benefit from education              OT Long Term Goals - 06/16/21 1438       OT LONG TERM GOAL #1   Title Pt will be independence with any updated HEP    Time 12    Period Weeks    Status On-going    Target Date 09/08/21      OT LONG TERM GOAL #2   Title Pt will increase functional use of BUE evidenced by completing Box and Blocks with score of 30 or greater with RUE, 45 or greater with LUE.    Baseline R 26, L 39    Time 12    Period Weeks    Status Revised   15 blocks 06/16/21   Target Date 09/08/21      OT LONG TERM GOAL #3   Title PT will improve grip strength in BUE by increasing grip strength to 10 lbs or greater with RUE and 20 lbs or greater with LUE.    Baseline R 1.5, L 12.3    Time 12    Period Weeks    Status On-going   RUE 3.3 lbs, LUE 14.5 lbs     OT LONG TERM GOAL #4   Title Pt will improve isolated finger movements in order to increase skill towards simple typing with adapted strategies and equipment PRN.    Baseline isolated movement in LUE    Time 12    Period Weeks    Status On-going      OT LONG TERM GOAL #5   Title Pt will improve 9 hole peg test in LUE to completing in 65 seconds or less in order to increase functional use and demonstrate ability to place 2 or more pegs with RUE.    Baseline L 75.13s, R unable  Time 12    Period Weeks    Status Partially Met   71.19s LUE 05/21/21, was able to place 2 pegs with RUE 05/21/21     OT LONG TERM GOAL #6   Title Pt will report completing UB and LB dressing with decreased assistance consistently.    Baseline UB (reports able to do but not consistently doing), LB total A    Time 12    Period Weeks    Status On-going                   Plan - 06/16/21 1415     Clinical Impression Statement This note serves as the  recertification for patient's plan of care. Pt continues to progress with increased coordination and manipulation of objects with BUE. Pt has demonstrated much improvement with tasks in therapy but not necessarily on objective assessments. Skilled occupational therapy continue sto be recommended to target BUE functional use and coordination and strength and increasing independence with ADLs and IADLs.    OT Occupational Profile and History Detailed Assessment- Review of Records and additional review of physical, cognitive, psychosocial history related to current functional performance    Occupational performance deficits (Please refer to evaluation for details): ADL's;IADL's;Leisure;Work;Rest and Sleep    Body Structure / Function / Physical Skills ADL;IADL;ROM;Strength;Decreased knowledge of use of DME;Dexterity;GMC;Pain;Tone;UE functional use;Body mechanics;Balance;Continence;FMC;Muscle spasms;Skin integrity;Flexibility;Mobility;Sensation;Improper spinal/pelvic alignment;Endurance    Rehab Potential Good    Clinical Decision Making Several treatment options, min-mod task modification necessary    Comorbidities Affecting Occupational Performance: May have comorbidities impacting occupational performance    Modification or Assistance to Complete Evaluation  Min-Moderate modification of tasks or assist with assess necessary to complete eval    OT Frequency 2x / week    OT Duration 12 weeks   @ renewal 06/16/21   OT Treatment/Interventions Self-care/ADL training;Moist Heat;Fluidtherapy;DME and/or AE instruction;Splinting;Therapeutic activities;Aquatic Therapy;Ultrasound;Therapeutic exercise;Cognitive remediation/compensation;Passive range of motion;Functional Mobility Training;Neuromuscular education;Electrical Stimulation;Paraffin;Manual Therapy;Patient/family education    Plan LB dressing techniques, bed mobility, coordination BUE    Consulted and Agree with Plan of Care Patient;Family member/caregiver     Family Member Consulted spouse [name]             Patient will benefit from skilled therapeutic intervention in order to improve the following deficits and impairments:   Body Structure / Function / Physical Skills: ADL, IADL, ROM, Strength, Decreased knowledge of use of DME, Dexterity, GMC, Pain, Tone, UE functional use, Body mechanics, Balance, Continence, FMC, Muscle spasms, Skin integrity, Flexibility, Mobility, Sensation, Improper spinal/pelvic alignment, Endurance       Visit Diagnosis: Quadriplegia, C5-C7 incomplete (HCC)  Other abnormalities of gait and mobility  Muscle weakness (generalized)  Other lack of coordination  Other symptoms and signs involving the nervous system  Other disturbances of skin sensation    Problem List Patient Active Problem List   Diagnosis Date Noted   Quadriplegia, C5-C7 incomplete ([place]) 01/16/2021   History of spinal fracture 01/16/2021   Suprapubic catheter ([place]) 01/16/2021   Encounter for routine gynecological examination 09/28/2013   Onychomycosis 09/28/2013   Foot deformity, acquired 03/26/2012   Encounter for preventive health examination 12/25/2010   ROSACEA 08/25/2009   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 [place], [place], [address] Phone: [phone]  Fax:  [phone]  Name: [name] MRN: [id number] Date of Birth: 31-May-1951

## 2021-06-17 NOTE — Therapy (Signed)
[facility] [address] [place], [place], [address] Phone: [phone]   Fax:  [phone]  Physical Therapy Treatment  Patient Details  Name: [name] MRN: [id number] Date of Birth: March 14, 1951 Referring Provider (PT): Landis Gandy   Encounter Date: 06/16/2021   PT End of Session - 06/17/21 1336     Visit Number 33    Number of Visits 62    Date for PT Re-Evaluation 08/07/21    Authorization Type [employer]; 36 visits 12/5-2/24/23    Authorization - Visit Number 8    Authorization - Number of Visits 36    Progress Note Due on Visit 65    PT Start Time 1315    PT Stop Time 1358    PT Time Calculation (min) 43 min    Equipment Utilized During Treatment Other (comment)   patient's personal leg loop   Activity Tolerance Patient tolerated treatment well    Behavior During Therapy [facility] for tasks assessed/performed             Past Medical History:  Diagnosis Date   CERVICAL POLYP 03/11/2008   Qualifier: Diagnosis of  By: Regis Bill MD, Standley Brooking    Colon polyps 2005   on colonscopy Dr. Fuller Plan   Fibroid 2004   Per Dr. Ouida Sills   History of shingles    face and mouth   Hx of skin cancer, basal cell    Rosacea    Sciatica of left side 09/28/2013   Scoliosis    noted on mri done for back pain    Past Surgical History:  Procedure Laterality Date   BUNIONECTOMY      There were no vitals filed for this visit.   Subjective Assessment - 06/17/21 1334     Subjective Trip to [place] [facility] [place] went well; feels a little tighter due to two weeks off from therapy    Pertinent History Pt also takes Toviaz 50m daily. PMH: hyperlipidemia, scoliosis, sleep disorder    Patient Stated Goals Pt would like to be able to walk even if its with assistance. She also wants to be able to type and improve her ability to do ADLs to allow for more independence.    Pain Onset More than a month ago              [facility]  Treatment/Exercise - 06/17/21 1349       Bed Mobility   Bed Mobility Rolling Right;Rolling Left;Right Sidelying to Sit;Left Sidelying to Sit;Sitting - Scoot to Edge of Bed;Sit to Supine    Rolling Right Supervision/verbal cueing    Rolling Left Set up assist    Right Sidelying to Sit Moderate Assistance - Patient 50-74%   supine > long sit through "C" push up; assistance to shift weight from elbow > wrist to extend UE; trunk support, shoulder support   Left Sidelying to Sit Maximal Assistance - Patient 25-49%   supine > long sitting through "C" push up; hooking RUE through leg loop to bring trunk upright and unweight L elbow to push up   Sitting - Scoot to Edge of Bed Contact Guard/Touching assist   long sit > short sit, PT reduced friction on heels; scooting across mat PT assisted with lifting buttocks and keeping trunk flexed   Sit to Supine Minimal Assistance - Patient > 75%   short sitting > supine pt able to lift LLE; assistance to bring RLE onto mat.  Pt able to transition long sitting >  supine with supervision     Transfers   Transfers Lateral/Scoot Transfers    Lateral/Scoot Transfers 4: Min guard;5: Supervision    Lateral/Scoot Transfer Details (indicate cue type and reason) power w/c downhill to mat without slideboard with CGA to prevent sliding too far forwards off mat; assistance to reposition R foot.  Performed level transfer mat > w/c without slideboard with supervision and intermittent assistance to reposition R foot on foot plate.              PT Education - 06/17/21 1335     Education Details progress towards goals; areas to continue to focus on    Person(s) Educated Patient;Caregiver(s)    Methods Explanation    Comprehension Verbalized understanding              PT Short Term Goals - 06/17/21 1337       PT SHORT TERM GOAL #1   Title Pt will be able to perform progressive HEP for stretching and strengthening with caregiver assist including standing frame to  continue gains at home.    Time 4    Period Weeks    Status Achieved    Target Date 06/11/21      PT SHORT TERM GOAL #2   Title Pt will be able to perform slideboard transfer on nonlevel surfaces mod I except for assist to reposition feet.    Baseline can perform slideboard MOD I; has begun to perform scooting transfers without slideboard and level and uneven surfaces    Time 4    Period Weeks    Status Achieved    Target Date 06/11/21      PT SHORT TERM GOAL #3   Title Pt will be able to roll right consistently with help of left leg loop mod I.    Baseline Intermittent supervision/verbal cues; can perform MOD I 90% of the time    Time 4    Period Weeks    Status Partially Met    Target Date 06/11/21      PT SHORT TERM GOAL #4   Title Pt will transfer supine to long sit utilizing "C" walk technique and leg loop CGA.    Baseline mod-max A to L; min-mod A to R with leg loop    Time 4    Period Weeks    Status Not Met    Target Date 06/11/21      PT SHORT TERM GOAL #5   Title Pt will be able to propel manual w/c 250' supervision for improved mobility and aerobic conditioning/strengthening.    Baseline has not been using consistently due to holidays/travel    Time 4    Period Weeks    Status Not Met    Target Date 06/11/21      PT SHORT TERM GOAL #6   Title Pt will be able to move legs off table long sit to short sit CGA for improved independence with mobility.    Time 4    Period Weeks    Status Achieved    Target Date 06/11/21            STG re-set:  PT Short Term Goals - 06/17/21 1356       PT SHORT TERM GOAL #1   Title Pt will consistently use standing frame as part of HEP, 2-3x/week to facilitate LE WB and trunk control    Time 4    Period Weeks    Status Revised    Target  Date 07/12/21      PT SHORT TERM GOAL #2   Title Pt will be able to lateral scoot transfer without slideboard on level surfaces mod I except for assist to reposition feet.    Baseline  can perform slideboard MOD I; has begun to perform scooting transfers without slideboard and level and uneven surfaces    Time 4    Period Weeks    Status Revised    Target Date 07/12/21      PT SHORT TERM GOAL #3   Title Pt will be able to roll right consistently with help of left leg loop mod I.    Baseline Intermittent supervision/verbal cues; can perform MOD I 90% of the time    Time 4    Period Weeks    Status Revised    Target Date 07/12/21      PT SHORT TERM GOAL #4   Title Pt will transfer supine to long sit utilizing "C" walk technique to L and R with leg loop and min A consistently    Baseline mod-max A to L; min-mod A to R with leg loop    Time 4    Period Weeks    Status Revised    Target Date 07/12/21      PT SHORT TERM GOAL #5   Title Pt will be able to propel manual w/c 250' supervision for improved mobility and aerobic conditioning/strengthening.    Baseline has not been using consistently due to holidays/travel    Time 4    Period Weeks    Status Revised    Target Date 07/12/21      PT SHORT TERM GOAL #6   Title Pt will consistently perform sit > supine on flat mat with leg loop and supervision; will be able to transition long sit > short sit MOD I    Time 4    Period Weeks    Status Revised    Target Date 07/12/21               PT Long Term Goals - 05/14/21 1424       PT LONG TERM GOAL #1   Title Pt will be able to perform short sit to supine transfer utilizing leg loop CGA for improved mobility.    Baseline currently able to get left leg up with loop but max assist for right.    Time 12    Period Weeks    Status New    Target Date 08/07/21      PT LONG TERM GOAL #2   Title Pt will be able to perform lateral scoot transfer without slideboard CGA for improved mobility.    Baseline 05/14/21- Pt still uses slide board and requires PT assist to reposition feet.    Time 12    Period Weeks    Status New    Target Date 08/07/21      PT LONG  TERM GOAL #3   Title Pt will be able to stand at counter x 2 min mod assist with PT blocking right leg for improved standing ability/ strength.    Baseline 05/14/21- pt uses standing frame at this time    Time 12    Period Weeks    Status Revised    Target Date 08/07/21      PT LONG TERM GOAL #4   Title Pt will be able to maintain long sit without UE support x 5 min for improved core stability and assist  with ADLs.    Time 12    Period Weeks    Status New    Target Date 08/07/21      PT LONG TERM GOAL #5   Title Pt will report being able to perform 20 minutes of manual w/c propulsion around home for improved UE strength and mobility.    Baseline 05/14/21- pt has manual w/c but has yet to use it    Time 12    Period Weeks    Status On-going    Target Date 08/07/21      Additional Long Term Goals   Additional Long Term Goals Yes      PT LONG TERM GOAL #6   Title Pt will be able to perform squat/pivot transfer mod assist of caregiver for improved mobility to uneven surfaces.    Baseline unable    Time 12    Period Weeks    Status New    Target Date 08/07/21                   Plan - 06/17/21 1341     Clinical Impression Statement Treatment session focused on assessment of progress towards STG.  Pt continues to make steady progress and has met 3/6 STG.  Pt continues to experience motor return and improved strength in LLE and is now able to perform slideboard transfers MOD I.  Pt has begun transfer training without use of slideboard on level and uneven surfaces.  Pt is making progress with independence with bed mobility and can perform rolling on flat mat 90% of the time MOD I; continues to require intermittent cues to complete roll to R.  Pt continues to require increased assistance to perform supine > long sitting due to ongoing core and UE weakness.  Pt and caregiver are independent with current HEP but has not been able to utilize manual wheelchair consistently yet.  PT to  continue to address in order to progress towards unmet goals.    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[employer] officer Evolving/Moderate complexity    Rehab Potential Good    PT Frequency 3x / week   plus eval   PT Duration 12 weeks    PT Treatment/Interventions ADLs/Self Care Home Management;Electrical Stimulation;DME Instruction;Neuromuscular re-education;Manual techniques;Therapeutic exercise;Balance training;Therapeutic activities;Cryotherapy;Moist Heat;Functional mobility training;Stair training;Gait training;Patient/family education;Orthotic Fit/Training;Wheelchair mobility training;Dry needling;Passive range of motion;Vestibular    PT Next Visit Plan Continue slideboard manual w/c <> mat or powerchair and parts management on manual w/c.  Lateral transfer without slideboard powerchair to/from mat for level or downhill, bed mobility. SciFit if has manual chair. Does need supervision to be sure does not tip. Continue to work on functional strengthening for UE, prone position for scapular strengthening. Continue work on long sit with moving legs on mat, balance with less UE support. Tricep dips using yoga blocks to try to get more lift. Utilizing "C" walk to come from sidelying to long sit.  At some point may try bioness to try to get more quad activation with exercises and possibly with standing frame. Pt does have standing frame at home - have encouraged her to use more daily.    Consulted and Agree with Plan of Care Patient;Family member/caregiver    Family Member Consulted Caregiver - [name]  Patient will benefit from skilled therapeutic intervention in order to improve the following deficits and impairments:   Decreased balance, Decreased mobility, Decreased strength, Impaired sensation, Postural dysfunction, Impaired flexibility, Impaired UE functional use, Impaired tone, Decreased range of motion  Visit Diagnosis: Quadriplegia, C5-C7 incomplete (HCC)  Muscle weakness (generalized)  Other abnormalities of gait and mobility  Other symptoms and signs involving the nervous system  Other disturbances of skin sensation     Problem List Patient Active Problem List   Diagnosis Date Noted   Quadriplegia, C5-C7 incomplete ([place]) 01/16/2021   History of spinal fracture 01/16/2021   Suprapubic catheter ([place]) 01/16/2021   Encounter for routine gynecological examination 09/28/2013   Onychomycosis 09/28/2013   Foot deformity, acquired 03/26/2012   Encounter for preventive health examination 12/25/2010   ROSACEA 08/25/2009   Disturbance in sleep behavior 03/11/2008   SKIN CANCER, HX OF 03/11/2008   DYSURIA, HX OF 03/11/2008   Hyperlipidemia 02/10/2007   CERVICALGIA 02/10/2007   Rico Junker, PT, DPT 06/17/21    2:02 PM    [facility] [address] [place], [place], [address] Phone: [phone]   Fax:  [phone]  Name: [name] MRN: [id number] Date of Birth: 04/14/1951

## 2021-06-18 ENCOUNTER — Encounter: Payer: Self-pay | Admitting: Occupational Therapy

## 2021-06-18 ENCOUNTER — Other Ambulatory Visit: Payer: Self-pay

## 2021-06-18 ENCOUNTER — Ambulatory Visit: Payer: Medicare PPO | Admitting: Occupational Therapy

## 2021-06-18 ENCOUNTER — Ambulatory Visit: Payer: Medicare PPO

## 2021-06-18 DIAGNOSIS — R278 Other lack of coordination: Secondary | ICD-10-CM | POA: Diagnosis not present

## 2021-06-18 DIAGNOSIS — R208 Other disturbances of skin sensation: Secondary | ICD-10-CM

## 2021-06-18 DIAGNOSIS — R29818 Other symptoms and signs involving the nervous system: Secondary | ICD-10-CM

## 2021-06-18 DIAGNOSIS — M6281 Muscle weakness (generalized): Secondary | ICD-10-CM | POA: Diagnosis not present

## 2021-06-18 DIAGNOSIS — G8254 Quadriplegia, C5-C7 incomplete: Secondary | ICD-10-CM

## 2021-06-18 DIAGNOSIS — R2689 Other abnormalities of gait and mobility: Secondary | ICD-10-CM

## 2021-06-18 DIAGNOSIS — R293 Abnormal posture: Secondary | ICD-10-CM | POA: Diagnosis not present

## 2021-06-18 NOTE — Therapy (Signed)
[facility] [address] [place], [place], [address] Phone: [phone]   Fax:  [phone]  Occupational Therapy Treatment  Patient Details  Name: [name] MRN: [id number] Date of Birth: 06-20-1950 Referring Provider (OT): Ina Homes, MD   Encounter Date: 06/18/2021   OT End of Session - 06/18/21 1153     Visit Number 25    Number of Visits 48   +24 visits at renewal 06/16/21   Date for OT Re-Evaluation 06/05/21    Authorization Type [employer]    Authorization Time Period Auth Req'd - 25 visits 05/06/21 - 07/19/21    Authorization - Visit Number 25    Authorization - Number of Visits 41   16+25   Progress Note Due on Visit 64    OT Start Time 1015    OT Stop Time 1100    OT Time Calculation (min) 45 min    Activity Tolerance Patient tolerated treatment well    Behavior During Therapy [facility] for tasks assessed/performed             Past Medical History:  Diagnosis Date   CERVICAL POLYP 03/11/2008   Qualifier: Diagnosis of  By: Regis Bill MD, Standley Brooking    Colon polyps 2005   on colonscopy Dr. Fuller Plan   Fibroid 2004   Per Dr. Ouida Sills   History of shingles    face and mouth   Hx of skin cancer, basal cell    Rosacea    Sciatica of left side 09/28/2013   Scoliosis    noted on mri done for back pain    Past Surgical History:  Procedure Laterality Date   BUNIONECTOMY      There were no vitals filed for this visit.   Subjective Assessment - 06/18/21 1152     Subjective  Pt arrived motivated and excited to begin therapy session on mat.    Patient is accompanied by: --   caregiver   Pertinent History hyperlipidemia, scoliosis, sleep disorder    Patient Stated Goals regain arm strength for rolling over in bed and fine motor coordination for increasing typing    Currently in Pain? Yes    Pain Score 4     Pain Location Arm    Pain Orientation Right;Left    Pain Descriptors /  Indicators Aching;Sharp    Pain Type Neuropathic pain;Chronic pain    Pain Onset More than a month ago    Pain Frequency Constant             UB dressing while seated edge of mat with min A for stability while sitting with threading BUE and pulling shirt over head. Pt with LOB x 3.   LB dressing with long sitting with 2nd person behind. Working towards increasing external rotation at hips for incresing ability to bring BLE closer for threading shorts. Pt unable to bring BLE closer for threading and req'd assistance. Pt able to doff L shoe and used shoe horn for doffing R shoe today. OT to continue to work towards increasing independence and working towards mobility in hips for increasing independence with ADLs and LB dressing.                        OT Short Term Goals - 06/16/21 1559       OT SHORT TERM GOAL #1   Title Pt will be independent with HEP w CG assistance PRN    Time  4    Period Weeks    Status Achieved    Target Date 04/13/21      OT SHORT TERM GOAL #2   Title Pt will verbalize understanding and report independence with CG assistance with ues of modalities at home with good safety.    Baseline has paraffin and Estim unit    Time 4    Period Weeks    Status Achieved   pt has new caregiver she is training for paraffin - reviewed and demonstrated understanding of estim unit     OT SHORT TERM GOAL #3   Title Pt will increase Box and Blocks score with RUE to 30 blocks or greater    Baseline R 26 L 39    Time 4    Period Weeks    Status Deferred   R 22 blocks  04/16/21, 5 blocks RUE 05/21/21 d/t finger cramping, 15 blocks 06/16/21 - deferred to updated LTG     OT SHORT TERM GOAL #4   Title Pt will increase BUE tricep strength to 4/5 consistently for increasing ability to perform SB transfers with supervision/set up assistance only.    Baseline 3+/5 strength BUE    Time 4    Period Weeks    Status Achieved      OT SHORT TERM GOAL #5   Title Pt will  increase coordination in LUE to completing 9 hole peg test in 70 seconds or less.    Baseline L 75.13s, R unable    Time 4    Period Weeks    Status Achieved   L 63.87s 04/16/21     OT SHORT TERM GOAL #6   Title Pt will verbalize understanding of adapted strategies and/or equipment for increasing indepenence with ADLs and IADLs (typing, bathing, cutting food, etc)    Baseline has U cuff    Time 4    Period Weeks    Status Achieved   has verbalized understanding of a lot of AE but continues to benefit from education              OT Long Term Goals - 06/16/21 1438       OT LONG TERM GOAL #1   Title Pt will be independence with any updated HEP    Time 12    Period Weeks    Status On-going    Target Date 09/08/21      OT LONG TERM GOAL #2   Title Pt will increase functional use of BUE evidenced by completing Box and Blocks with score of 30 or greater with RUE, 45 or greater with LUE.    Baseline R 26, L 39    Time 12    Period Weeks    Status Revised   15 blocks 06/16/21   Target Date 09/08/21      OT LONG TERM GOAL #3   Title PT will improve grip strength in BUE by increasing grip strength to 10 lbs or greater with RUE and 20 lbs or greater with LUE.    Baseline R 1.5, L 12.3    Time 12    Period Weeks    Status On-going   RUE 3.3 lbs, LUE 14.5 lbs     OT LONG TERM GOAL #4   Title Pt will improve isolated finger movements in order to increase skill towards simple typing with adapted strategies and equipment PRN.    Baseline isolated movement in LUE    Time 12  Period Weeks    Status On-going      OT LONG TERM GOAL #5   Title Pt will improve 9 hole peg test in LUE to completing in 65 seconds or less in order to increase functional use and demonstrate ability to place 2 or more pegs with RUE.    Baseline L 75.13s, R unable    Time 12    Period Weeks    Status Partially Met   71.19s LUE 05/21/21, was able to place 2 pegs with RUE 05/21/21     OT LONG TERM GOAL #6    Title Pt will report completing UB and LB dressing with decreased assistance consistently.    Baseline UB (reports able to do but not consistently doing), LB total A    Time 12    Period Weeks    Status On-going                   Plan - 06/18/21 1206     Clinical Impression Statement Pt very motivated for increasing independence and determined for increasking skills with LB dressing today. Continue to work on hip mobility and decreasing dependence with ADLs.    OT Occupational Profile and History Detailed Assessment- Review of Records and additional review of physical, cognitive, psychosocial history related to current functional performance    Occupational performance deficits (Please refer to evaluation for details): ADL's;IADL's;Leisure;Work;Rest and Sleep    Body Structure / Function / Physical Skills ADL;IADL;ROM;Strength;Decreased knowledge of use of DME;Dexterity;GMC;Pain;Tone;UE functional use;Body mechanics;Balance;Continence;FMC;Muscle spasms;Skin integrity;Flexibility;Mobility;Sensation;Improper spinal/pelvic alignment;Endurance    Rehab Potential Good    Clinical Decision Making Several treatment options, min-mod task modification necessary    Comorbidities Affecting Occupational Performance: May have comorbidities impacting occupational performance    Modification or Assistance to Complete Evaluation  Min-Moderate modification of tasks or assist with assess necessary to complete eval    OT Frequency 2x / week    OT Duration 12 weeks   @ renewal 06/16/21   OT Treatment/Interventions Self-care/ADL training;Moist Heat;Fluidtherapy;DME and/or AE instruction;Splinting;Therapeutic activities;Aquatic Therapy;Ultrasound;Therapeutic exercise;Cognitive remediation/compensation;Passive range of motion;Functional Mobility Training;Neuromuscular education;Electrical Stimulation;Paraffin;Manual Therapy;Patient/family education    Plan LB dressing techniques, bed mobility, coordination BUE     Consulted and Agree with Plan of Care Patient;Family member/caregiver    Family Member Consulted spouse [name]             Patient will benefit from skilled therapeutic intervention in order to improve the following deficits and impairments:   Body Structure / Function / Physical Skills: ADL, IADL, ROM, Strength, Decreased knowledge of use of DME, Dexterity, GMC, Pain, Tone, UE functional use, Body mechanics, Balance, Continence, FMC, Muscle spasms, Skin integrity, Flexibility, Mobility, Sensation, Improper spinal/pelvic alignment, Endurance       Visit Diagnosis: Quadriplegia, C5-C7 incomplete (HCC)  Other abnormalities of gait and mobility  Muscle weakness (generalized)  Other lack of coordination  Other symptoms and signs involving the nervous system  Other disturbances of skin sensation    Problem List Patient Active Problem List   Diagnosis Date Noted   Quadriplegia, C5-C7 incomplete ([facility]) 01/16/2021   History of spinal fracture 01/16/2021   Suprapubic catheter ([place]) 01/16/2021   Encounter for routine gynecological examination 09/28/2013   Onychomycosis 09/28/2013   Foot deformity, acquired 03/26/2012   Encounter for preventive health examination 12/25/2010   ROSACEA 08/25/2009   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 [place], [place], [address] Phone: [phone]   Fax:  [phone]  Name: Willard Madrigal MRN: [id number] Date of Birth: [birth year]

## 2021-06-18 NOTE — Therapy (Signed)
[facility] [address] [place], [place], [address] Phone: [phone]   Fax:  [phone]  Physical Therapy Treatment  Patient Details  Name: [name] MRN: [id number] Date of Birth: 12-Mar-1951 Referring Provider (PT): Landis Gandy   Encounter Date: 06/18/2021   PT End of Session - 06/18/21 1102     Visit Number 34    Number of Visits 11    Date for PT Re-Evaluation 08/07/21    Authorization Type [employer]; 36 visits 12/5-2/24/23    Authorization - Visit Number 8    Authorization - Number of Visits 36    Progress Note Due on Visit 50    PT Start Time 1100    PT Stop Time 1148    PT Time Calculation (min) 48 min    Equipment Utilized During Treatment Other (comment)   patient's personal leg loop   Activity Tolerance Patient tolerated treatment well    Behavior During Therapy [facility] for tasks assessed/performed             Past Medical History:  Diagnosis Date   CERVICAL POLYP 03/11/2008   Qualifier: Diagnosis of  By: Regis Bill MD, Standley Brooking    Colon polyps 2005   on colonscopy Dr. Fuller Plan   Fibroid 2004   Per Dr. Ouida Sills   History of shingles    face and mouth   Hx of skin cancer, basal cell    Rosacea    Sciatica of left side 09/28/2013   Scoliosis    noted on mri done for back pain    Past Surgical History:  Procedure Laterality Date   BUNIONECTOMY      There were no vitals filed for this visit.   Subjective Assessment - 06/18/21 1102     Subjective Pt reports she is doing well. She is working on long sit stretching and can come down further with no sciatic pain.    Pertinent History Pt also takes Toviaz 4mg  daily. PMH: hyperlipidemia, scoliosis, sleep disorder    Patient Stated Goals Pt would like to be able to walk even if its with assistance. She also wants to be able to type and improve her ability to do ADLs to allow for more independence.    Currently in Pain? Yes    Pain  Score 4     Pain Location Arm    Pain Orientation Right;Left    Pain Descriptors / Indicators Aching;Sharp    Pain Type Neuropathic pain;Chronic pain    Pain Onset More than a month ago                               [facility] Adult PT Treatment/Exercise - 06/18/21 1103       Transfers   Transfers Lateral/Scoot Transfers    Lateral/Scoot Transfers 4: Min guard;4: Medical illustrator Details (indicate cue type and reason) mat to powerchair slightly uphill with CGA to get  right hip on cushion. PT assisted to place right foot on foot plate and reposition left foot some. Pt was able to lift left foot on to foot plate.      Therapeutic Activites    Therapeutic Activities Other Therapeutic Activities    Other Therapeutic Activities In long sit: tried to position pt's legs in circle sit some but hips too tight to maintain even partial position. PT stretched 30 sec each side in this position. Supine  to long sit: rolling right with PT assisting to position LLE in flexion prior to rolling as did not have strap on, Min assist to assist to get right elbow under her, pt then utilized holding to edge of mat with LUE to help unweight some to walk foward in "C". Then pushed LLE straight and rocked anchoring LUE on leg to come up to long sit pushing through right arm CGA to stabilize. Cued to keep weight forward. Coming down on forearms and walking back some then back up x 5 each side close SBA/CGA. Long sit to short sit edge of mat moving legs with hands a little at a time supervision with CGA under right heel just towards end to unweight slightly.      Neuro Re-ed    Neuro Re-ed Details  In long sit: holding purple ball between hands in front with PT providing manual pertubations in all directions 30 sec x 2. Reaching for bean bags across body and forward with propping on ipsilateral arm at side x 10 each side. Able to slide bean bag back with right hand but not grip to lift.  Short sit edge of mat: holding purple ball in front trying to maintain balance 30 sec x 2 with PT in front for safety.      Exercises   Exercises Other Exercises    Other Exercises  Supine: hip stretch in to ER for IR stretch 1 min x 4 each side then hooklying hip ER stretch 1 min x 3. Caregiver watching throughout to learn these to add to HEP.                     PT Education - 06/18/21 1938     Education Details Discussed adding in hip ER stretches.    Person(s) Educated Patient;Caregiver(s)    Methods Explanation;Demonstration    Comprehension Verbalized understanding              PT Short Term Goals - 06/17/21 1356       PT SHORT TERM GOAL #1   Title Pt will consistently use standing frame as part of HEP, 2-3x/week to facilitate LE WB and trunk control    Time 4    Period Weeks    Status Revised    Target Date 07/12/21      PT SHORT TERM GOAL #2   Title Pt will be able to lateral scoot transfer without slideboard on level surfaces mod I except for assist to reposition feet.    Baseline can perform slideboard MOD I; has begun to perform scooting transfers without slideboard and level and uneven surfaces    Time 4    Period Weeks    Status Revised    Target Date 07/12/21      PT SHORT TERM GOAL #3   Title Pt will be able to roll right consistently with help of left leg loop mod I.    Baseline Intermittent supervision/verbal cues; can perform MOD I 90% of the time    Time 4    Period Weeks    Status Revised    Target Date 07/12/21      PT SHORT TERM GOAL #4   Title Pt will transfer supine to long sit utilizing "C" walk technique to L and R with leg loop and min A consistently    Baseline mod-max A to L; min-mod A to R with leg loop    Time 4    Period Weeks  Status Revised    Target Date 07/12/21      PT SHORT TERM GOAL #5   Title Pt will be able to propel manual w/c 250' supervision for improved mobility and aerobic conditioning/strengthening.     Baseline has not been using consistently due to holidays/travel    Time 4    Period Weeks    Status Revised    Target Date 07/12/21      PT SHORT TERM GOAL #6   Title Pt will consistently perform sit > supine on flat mat with leg loop and supervision; will be able to transition long sit > short sit MOD I    Time 4    Period Weeks    Status Revised    Target Date 07/12/21               PT Long Term Goals - 05/14/21 1424       PT LONG TERM GOAL #1   Title Pt will be able to perform short sit to supine transfer utilizing leg loop CGA for improved mobility.    Baseline currently able to get left leg up with loop but max assist for right.    Time 12    Period Weeks    Status New    Target Date 08/07/21      PT LONG TERM GOAL #2   Title Pt will be able to perform lateral scoot transfer without slideboard CGA for improved mobility.    Baseline 05/14/21- Pt still uses slide board and requires PT assist to reposition feet.    Time 12    Period Weeks    Status New    Target Date 08/07/21      PT LONG TERM GOAL #3   Title Pt will be able to stand at counter x 2 min mod assist with PT blocking right leg for improved standing ability/ strength.    Baseline 05/14/21- pt uses standing frame at this time    Time 12    Period Weeks    Status Revised    Target Date 08/07/21      PT LONG TERM GOAL #4   Title Pt will be able to maintain long sit without UE support x 5 min for improved core stability and assist with ADLs.    Time 12    Period Weeks    Status New    Target Date 08/07/21      PT LONG TERM GOAL #5   Title Pt will report being able to perform 20 minutes of manual w/c propulsion around home for improved UE strength and mobility.    Baseline 05/14/21- pt has manual w/c but has yet to use it    Time 12    Period Weeks    Status On-going    Target Date 08/07/21      Additional Long Term Goals   Additional Long Term Goals Yes      PT LONG TERM GOAL #6   Title Pt  will be able to perform squat/pivot transfer mod assist of caregiver for improved mobility to uneven surfaces.    Baseline unable    Time 12    Period Weeks    Status New    Target Date 08/07/21                   Plan - 06/18/21 1929     Clinical Impression Statement Pt's session was on soft mat. Pt was able to perform C  walk from sidelying to long sit with less assistance today and quicker. Continued to work more on hip stretching in to ER to help more with OT plan to work on more dressing. Also focused on sitting balance without UE support. Pt most challenged short sit with this today.    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[employer] officer Evolving/Moderate complexity    Rehab Potential Good    PT Frequency 3x / week   plus eval   PT Duration 12 weeks    PT Treatment/Interventions ADLs/Self Care Home Management;Electrical Stimulation;DME Instruction;Neuromuscular re-education;Manual techniques;Therapeutic exercise;Balance training;Therapeutic activities;Cryotherapy;Moist Heat;Functional mobility training;Stair training;Gait training;Patient/family education;Orthotic Fit/Training;Wheelchair mobility training;Dry needling;Passive range of motion;Vestibular    PT Next Visit Plan Issue pictures for ER stretching in supine and hooklying. Continue slideboard manual w/c <> mat or powerchair and parts management on manual w/c.  Lateral transfer without slideboard powerchair to/from mat for level or downhill, bed mobility. SciFit if has manual chair. Does need supervision to be sure does not tip. Continue to work on functional strengthening for UE, prone position for scapular strengthening. Continue work on long sit with moving  legs on mat, balance with less UE support. Tricep dips using yoga blocks to try to get more lift. Utilizing "C" walk to come from sidelying to long sit.  At some point may try bioness to try to get more quad activation with exercises and possibly with standing frame. Pt does have standing frame at home - have encouraged her to use more daily.    Consulted and Agree with Plan of Care Patient;Family member/caregiver    Family Member Consulted Caregiver - [name]             Patient will benefit from skilled therapeutic intervention in order to improve the following deficits and impairments:  Decreased balance, Decreased mobility, Decreased strength, Impaired sensation, Postural dysfunction, Impaired flexibility, Impaired UE functional use, Impaired tone, Decreased range of motion  Visit Diagnosis: Quadriplegia, C5-C7 incomplete ([facility] [facility])  Muscle weakness (generalized)     Problem List Patient Active Problem List   Diagnosis Date Noted   Quadriplegia, C5-C7 incomplete ([place]) 01/16/2021   History of spinal fracture 01/16/2021   Suprapubic catheter ([place]) 01/16/2021   Encounter for routine gynecological examination 09/28/2013   Onychomycosis 09/28/2013   Foot deformity, acquired 03/26/2012   Encounter for preventive health examination 12/25/2010   ROSACEA 08/25/2009   Disturbance in sleep behavior 03/11/2008   SKIN CANCER, HX OF 03/11/2008   DYSURIA, HX OF 03/11/2008   Hyperlipidemia 02/10/2007   CERVICALGIA 02/10/2007    Electa Sniff, PT, DPT, NCS 06/18/2021, 7:41 PM  [facility] [address] [place], [place], [address] Phone: [phone]   Fax:  [phone]  Name: [name] MRN: [id number] Date of Birth: 1950-09-12

## 2021-06-19 ENCOUNTER — Ambulatory Visit: Payer: Medicare PPO

## 2021-06-19 DIAGNOSIS — R29818 Other symptoms and signs involving the nervous system: Secondary | ICD-10-CM | POA: Diagnosis not present

## 2021-06-19 DIAGNOSIS — R2689 Other abnormalities of gait and mobility: Secondary | ICD-10-CM | POA: Diagnosis not present

## 2021-06-19 DIAGNOSIS — G8254 Quadriplegia, C5-C7 incomplete: Secondary | ICD-10-CM

## 2021-06-19 DIAGNOSIS — R208 Other disturbances of skin sensation: Secondary | ICD-10-CM | POA: Diagnosis not present

## 2021-06-19 DIAGNOSIS — R278 Other lack of coordination: Secondary | ICD-10-CM | POA: Diagnosis not present

## 2021-06-19 DIAGNOSIS — R293 Abnormal posture: Secondary | ICD-10-CM | POA: Diagnosis not present

## 2021-06-19 DIAGNOSIS — M6281 Muscle weakness (generalized): Secondary | ICD-10-CM | POA: Diagnosis not present

## 2021-06-19 NOTE — Therapy (Signed)
[facility] [address] [place], [place], [address] Phone: [phone]   Fax:  [phone]  Physical Therapy Treatment  Patient Details  Name: [name] MRN: [id number] Date of Birth: [birth year] Referring Provider (PT): Landis Gandy   Encounter Date: 06/19/2021   PT End of Session - 06/19/21 1104     Visit Number 35    Number of Visits 61    Date for PT Re-Evaluation 08/07/21    Authorization Type [employer]; 36 visits 12/5-2/24/23    Authorization - Visit Number 9    Authorization - Number of Visits 36    Progress Note Due on Visit 67    PT Start Time 1100    PT Stop Time 1149    PT Time Calculation (min) 49 min    Equipment Utilized During Treatment Other (comment)   patient's personal leg loop   Activity Tolerance Patient tolerated treatment well    Behavior During Therapy [facility] for tasks assessed/performed             Past Medical History:  Diagnosis Date   CERVICAL POLYP 03/11/2008   Qualifier: Diagnosis of  By: Regis Bill MD, Standley Brooking    Colon polyps 2005   on colonscopy Dr. Fuller Plan   Fibroid 2004   Per Dr. Ouida Sills   History of shingles    face and mouth   Hx of skin cancer, basal cell    Rosacea    Sciatica of left side 09/28/2013   Scoliosis    noted on mri done for back pain    Past Surgical History:  Procedure Laterality Date   BUNIONECTOMY      There were no vitals filed for this visit.   Subjective Assessment - 06/19/21 1104     Subjective Pt denies any changes from yesterday.    Pertinent History Pt also takes Toviaz 4mg  daily. PMH: hyperlipidemia, scoliosis, sleep disorder    Patient Stated Goals Pt would like to be able to walk even if its with assistance. She also wants to be able to type and improve her ability to do ADLs to allow for more independence.    Currently in Pain? Yes    Pain Score 4     Pain Location Arm    Pain Orientation Right;Left    Pain  Descriptors / Indicators Aching;Sharp    Pain Type Neuropathic pain    Pain Onset More than a month ago                               [facility] Adult PT Treatment/Exercise - 06/19/21 1105       Transfers   Transfers Lateral/Scoot Transfers    Lateral/Scoot Transfers 5: Supervision;4: Min guard    Lateral/Scoot Transfer Details (indicate cue type and reason) powerchair to/from mat scooting. Had pt move legs off leg rests with leg strap on RLE. Pt needed some assist to leg right leg back on leg rest fully with return.      Therapeutic Activites    Therapeutic Activities Other Therapeutic Activities    Other Therapeutic Activities Short sit to long sit: mod assist to get legs on mat with pt pivoting around supporting on hands to turn.      Neuro Re-ed    Neuro Re-ed Details  Short sit edge of mat: Sitting balance holding purple ball in front so no UE support x 30 sec, moving ball up  and down x 10, moving ball side to side for trunk rotation and some oblique activation x 10, holding ball in front with PT providing pertubations on ball x 30 sec in varied directions. Pt initially challenged when pushed back as overcorrected but able to regain on own. Repeated trunk rotation holding ball between hands to each side touching top of cone out to side x 5 each direction. Pt most challenged with going to the right moving quickly to help recover. Moving ball in D1 diagonals with touching therapists hand coming down with ball x 5 each side. Close supervision with PT in front for safety and wedge behind. PT did not have to physically assist throughout though.      Exercises   Exercises Other Exercises    Other Exercises  Sitting edge of mat: LAQ on LLE with 2# weight 5 x 3, stepping LLE out to side and back 5 x 3 with 3# weight. Supine on mat: RLE on red physioball with PT stabilizing AAROM in to hip flexion max assist and then resistance to facilitate movement in to extension 5 x 2. PT had  pt's caregiver, [name], perform as well so she could get a feel for it to do at home. Cuing for proper hand placement. PT also demonstrated how to perform without ball. Pt able to get more activation at gluts pushing some in to extension.                     PT Education - 06/19/21 2004     Education Details Hip flexion/extension on ball for RLE with caregiver    Person(s) Educated Patient;Caregiver(s)    Methods Explanation    Comprehension Verbalized understanding              PT Short Term Goals - 06/17/21 1356       PT SHORT TERM GOAL #1   Title Pt will consistently use standing frame as part of HEP, 2-3x/week to facilitate LE WB and trunk control    Time 4    Period Weeks    Status Revised    Target Date 07/12/21      PT SHORT TERM GOAL #2   Title Pt will be able to lateral scoot transfer without slideboard on level surfaces mod I except for assist to reposition feet.    Baseline can perform slideboard MOD I; has begun to perform scooting transfers without slideboard and level and uneven surfaces    Time 4    Period Weeks    Status Revised    Target Date 07/12/21      PT SHORT TERM GOAL #3   Title Pt will be able to roll right consistently with help of left leg loop mod I.    Baseline Intermittent supervision/verbal cues; can perform MOD I 90% of the time    Time 4    Period Weeks    Status Revised    Target Date 07/12/21      PT SHORT TERM GOAL #4   Title Pt will transfer supine to long sit utilizing "C" walk technique to L and R with leg loop and min A consistently    Baseline mod-max A to L; min-mod A to R with leg loop    Time 4    Period Weeks    Status Revised    Target Date 07/12/21      PT SHORT TERM GOAL #5   Title Pt will be able to propel manual  w/c 250' supervision for improved mobility and aerobic conditioning/strengthening.    Baseline has not been using consistently due to holidays/travel    Time 4    Period Weeks    Status Revised     Target Date 07/12/21      PT SHORT TERM GOAL #6   Title Pt will consistently perform sit > supine on flat mat with leg loop and supervision; will be able to transition long sit > short sit MOD I    Time 4    Period Weeks    Status Revised    Target Date 07/12/21               PT Long Term Goals - 05/14/21 1424       PT LONG TERM GOAL #1   Title Pt will be able to perform short sit to supine transfer utilizing leg loop CGA for improved mobility.    Baseline currently able to get left leg up with loop but max assist for right.    Time 12    Period Weeks    Status New    Target Date 08/07/21      PT LONG TERM GOAL #2   Title Pt will be able to perform lateral scoot transfer without slideboard CGA for improved mobility.    Baseline 05/14/21- Pt still uses slide board and requires PT assist to reposition feet.    Time 12    Period Weeks    Status New    Target Date 08/07/21      PT LONG TERM GOAL #3   Title Pt will be able to stand at counter x 2 min mod assist with PT blocking right leg for improved standing ability/ strength.    Baseline 05/14/21- pt uses standing frame at this time    Time 12    Period Weeks    Status Revised    Target Date 08/07/21      PT LONG TERM GOAL #4   Title Pt will be able to maintain long sit without UE support x 5 min for improved core stability and assist with ADLs.    Time 12    Period Weeks    Status New    Target Date 08/07/21      PT LONG TERM GOAL #5   Title Pt will report being able to perform 20 minutes of manual w/c propulsion around home for improved UE strength and mobility.    Baseline 05/14/21- pt has manual w/c but has yet to use it    Time 12    Period Weeks    Status On-going    Target Date 08/07/21      Additional Long Term Goals   Additional Long Term Goals Yes      PT LONG TERM GOAL #6   Title Pt will be able to perform squat/pivot transfer mod assist of caregiver for improved mobility to uneven surfaces.     Baseline unable    Time 12    Period Weeks    Status New    Target Date 08/07/21                   Plan - 06/19/21 2005     Clinical Impression Statement Pt continues to show improvements in LLE strength tolerating 2# weight with LAQ today. Able to move LLE with transfers more without assistance. Pt was more stable with short sitting balance without UE support today.    Personal Factors  and Comorbidities Comorbidity 2    Comorbidities scoliosis and sleep disorder    Examination-Activity Limitations Bed Mobility;Locomotion Level;Transfers;Stand;Bathing;Dressing    Examination-Participation Freight forwarder;Yard Work    [employer] officer Evolving/Moderate complexity    Rehab Potential Good    PT Frequency 3x / week   plus eval   PT Duration 12 weeks    PT Treatment/Interventions ADLs/Self Care Home Management;Electrical Stimulation;DME Instruction;Neuromuscular re-education;Manual techniques;Therapeutic exercise;Balance training;Therapeutic activities;Cryotherapy;Moist Heat;Functional mobility training;Stair training;Gait training;Patient/family education;Orthotic Fit/Training;Wheelchair mobility training;Dry needling;Passive range of motion;Vestibular    PT Next Visit Plan Issue pictures for ER stretching in supine and hooklying as well as RLE hip flex/ext on physioball. Continue slideboard manual w/c <> mat or powerchair and parts management on manual w/c.  Lateral transfer without slideboard powerchair to/from mat for level or downhill, bed mobility. SciFit if has manual chair. Does need supervision to be sure does not tip. Continue to work on functional strengthening for UE, prone position for scapular strengthening. Continue work on long sit with moving legs on mat, balance with less UE support. Tricep dips using yoga blocks to try to get more lift. Utilizing "C" walk to come from sidelying to long sit.  At some point may try bioness to  try to get more quad activation with exercises and possibly with standing frame. Pt does have standing frame at home - have encouraged her to use more daily.    Consulted and Agree with Plan of Care Patient;Family member/caregiver    Family Member Consulted Caregiver - [name]             Patient will benefit from skilled therapeutic intervention in order to improve the following deficits and impairments:  Decreased balance, Decreased mobility, Decreased strength, Impaired sensation, Postural dysfunction, Impaired flexibility, Impaired UE functional use, Impaired tone, Decreased range of motion  Visit Diagnosis: Quadriplegia, C5-C7 incomplete ([facility])  Muscle weakness (generalized)     Problem List Patient Active Problem List   Diagnosis Date Noted   Quadriplegia, C5-C7 incomplete ([place]) 01/16/2021   History of spinal fracture 01/16/2021   Suprapubic catheter ([facility]) 01/16/2021   Encounter for routine gynecological examination 09/28/2013   Onychomycosis 09/28/2013   Foot deformity, acquired 03/26/2012   Encounter for preventive health examination 12/25/2010   ROSACEA 08/25/2009   Disturbance in sleep behavior 03/11/2008   SKIN CANCER, HX OF 03/11/2008   DYSURIA, HX OF 03/11/2008   Hyperlipidemia 02/10/2007   CERVICALGIA 02/10/2007    Electa Sniff, PT, DPT, NCS 06/19/2021, 8:07 PM  [facility] [address] [place], [place], [address] Phone: [phone]   Fax:  [phone]  Name: Kylinn Shropshire MRN: [id number] Date of Birth: [birth year]

## 2021-06-23 ENCOUNTER — Ambulatory Visit: Payer: Medicare PPO

## 2021-06-23 ENCOUNTER — Ambulatory Visit: Payer: Medicare PPO | Admitting: Podiatry

## 2021-06-23 ENCOUNTER — Ambulatory Visit: Payer: Medicare PPO | Admitting: Occupational Therapy

## 2021-06-23 ENCOUNTER — Other Ambulatory Visit: Payer: Self-pay

## 2021-06-23 ENCOUNTER — Encounter: Payer: Self-pay | Admitting: Occupational Therapy

## 2021-06-23 DIAGNOSIS — G8254 Quadriplegia, C5-C7 incomplete: Secondary | ICD-10-CM

## 2021-06-23 DIAGNOSIS — R2689 Other abnormalities of gait and mobility: Secondary | ICD-10-CM

## 2021-06-23 DIAGNOSIS — L89890 Pressure ulcer of other site, unstageable: Secondary | ICD-10-CM

## 2021-06-23 DIAGNOSIS — M6281 Muscle weakness (generalized): Secondary | ICD-10-CM

## 2021-06-23 DIAGNOSIS — R208 Other disturbances of skin sensation: Secondary | ICD-10-CM

## 2021-06-23 DIAGNOSIS — R29818 Other symptoms and signs involving the nervous system: Secondary | ICD-10-CM

## 2021-06-23 DIAGNOSIS — R293 Abnormal posture: Secondary | ICD-10-CM | POA: Diagnosis not present

## 2021-06-23 DIAGNOSIS — R278 Other lack of coordination: Secondary | ICD-10-CM

## 2021-06-23 NOTE — Therapy (Signed)
[facility] °[address] °[place], [place], [address] °Phone: [phone]   Fax:  [phone] ° °Occupational Therapy Treatment ° °Patient Details  °Name: [name] °MRN: [id number] °Date of Birth: 06/03/1951 °Referring Provider (OT): Robbins, Gregory Thomas, MD ° ° °Encounter Date: 06/23/2021 ° ° OT End of Session - 06/23/21 1023   ° ° Visit Number 26   ° Number of Visits 48   +24 visits at renewal 06/16/21  ° Date for OT Re-Evaluation 09/08/21   add at renewal  ° Authorization Type [employer]   ° Authorization Time Period Auth Req'd - 25 visits 05/06/21 - 07/19/21   ° Authorization - Visit Number 26   ° Authorization - Number of Visits 41   16+25  ° Progress Note Due on Visit 34   ° OT Start Time 1015   ° OT Stop Time 1057   ° OT Time Calculation (min) 42 min   ° Activity Tolerance Patient tolerated treatment well   ° Behavior During Therapy WFL for tasks assessed/performed   ° °  °  ° °  ° ° °Past Medical History:  °Diagnosis Date  ° CERVICAL POLYP 03/11/2008  ° Qualifier: Diagnosis of  By: Panosh MD, Wanda K   ° Colon polyps 2005  ° on colonscopy Dr. Stark  ° Fibroid 2004  ° Per Dr. Anderson  ° History of shingles   ° face and mouth  ° Hx of skin cancer, basal cell   ° Rosacea   ° Sciatica of left side 09/28/2013  ° Scoliosis   ° noted on mri done for back pain  ° ° °Past Surgical History:  °Procedure Laterality Date  ° BUNIONECTOMY    ° ° °There were no vitals filed for this visit. ° ° Subjective Assessment - 06/23/21 1016   ° ° Subjective  Pt denies any changes and denies any pain.   ° Patient is accompanied by: --   caregiver  ° Pertinent History hyperlipidemia, scoliosis, sleep disorder   ° Patient Stated Goals regain arm strength for rolling over in bed and fine motor coordination for increasing typing   ° Currently in Pain? Yes   ° Pain Score 4    ° Pain Location Arm   ° Pain Orientation Left;Right   ° Pain Descriptors / Indicators  Aching;Sharp   ° Pain Type Neuropathic pain   ° Pain Onset More than a month ago   ° Pain Frequency Constant   ° °  °  ° °  ° ° °Transfer  squat pivot without sliding board to left to edge of mat from power chair with supervision and set up assistance ° °Grooved Pegs with LUE with mod drops and difficulty. Removed with RUE with mod/max difficulty ° °Connect Four chips into frame with RUE with good coordination with min difficulty.  ° ° °All activities completed at edge of mat with no support behind patient and with UE support PRN. ° ° °P/ROM to BLE hip external rotation for preparing for LB dressing with figure 4 or circle sit. In supine. ° ° ° ° ° ° ° ° ° ° ° ° ° ° ° ° ° ° ° ° ° ° OT Short Term Goals - 06/16/21 1559   ° °  ° OT SHORT TERM GOAL #1  ° Title Pt will be independent with HEP w CG assistance PRN   ° Time 4   ° Period Weeks   ° Status Achieved   °   Target Date 04/13/21      OT SHORT TERM GOAL #2   Title Pt will verbalize understanding and report independence with CG assistance with ues of modalities at home with good safety.    Baseline has paraffin and Estim unit    Time 4    Period Weeks    Status Achieved   pt has new caregiver she is training for paraffin - reviewed and demonstrated understanding of estim unit     OT SHORT TERM GOAL #3   Title Pt will increase Box and Blocks score with RUE to 30 blocks or greater    Baseline R 26 L 39    Time 4    Period Weeks    Status Deferred   R 22 blocks  04/16/21, 5 blocks RUE 05/21/21 d/t finger cramping, 15 blocks 06/16/21 - deferred to updated LTG     OT SHORT TERM GOAL #4   Title Pt will increase BUE tricep strength to 4/5 consistently for increasing ability to perform SB transfers with supervision/set up assistance only.    Baseline 3+/5 strength BUE    Time 4    Period Weeks    Status Achieved      OT SHORT TERM GOAL #5   Title Pt will increase coordination in LUE to completing 9 hole peg test in 70 seconds or less.    Baseline L  75.13s, R unable    Time 4    Period Weeks    Status Achieved   L 63.87s 04/16/21     OT SHORT TERM GOAL #6   Title Pt will verbalize understanding of adapted strategies and/or equipment for increasing indepenence with ADLs and IADLs (typing, bathing, cutting food, etc)    Baseline has U cuff    Time 4    Period Weeks    Status Achieved   has verbalized understanding of a lot of AE but continues to benefit from education              OT Long Term Goals - 06/16/21 1438       OT LONG TERM GOAL #1   Title Pt will be independence with any updated HEP    Time 12    Period Weeks    Status On-going    Target Date 09/08/21      OT LONG TERM GOAL #2   Title Pt will increase functional use of BUE evidenced by completing Box and Blocks with score of 30 or greater with RUE, 45 or greater with LUE.    Baseline R 26, L 39    Time 12    Period Weeks    Status Revised   15 blocks 06/16/21   Target Date 09/08/21      OT LONG TERM GOAL #3   Title PT will improve grip strength in BUE by increasing grip strength to 10 lbs or greater with RUE and 20 lbs or greater with LUE.    Baseline R 1.5, L 12.3    Time 12    Period Weeks    Status On-going   RUE 3.3 lbs, LUE 14.5 lbs     OT LONG TERM GOAL #4   Title Pt will improve isolated finger movements in order to increase skill towards simple typing with adapted strategies and equipment PRN.    Baseline isolated movement in LUE    Time 12    Period Weeks    Status On-going      OT LONG  TERM GOAL #5  ° Title Pt will improve 9 hole peg test in LUE to completing in 65 seconds or less in order to increase functional use and demonstrate ability to place 2 or more pegs with RUE.   ° Baseline L 75.13s, R unable   ° Time 12   ° Period Weeks   ° Status Partially Met   71.19s LUE 05/21/21, was able to place 2 pegs with RUE 05/21/21  °  ° OT LONG TERM GOAL #6  ° Title Pt will report completing UB and LB dressing with decreased assistance consistently.   °  Baseline UB (reports able to do but not consistently doing), LB total A   ° Time 12   ° Period Weeks   ° Status On-going   ° °  °  ° °  ° ° ° ° ° ° ° ° Plan - 06/23/21 1047   ° ° Clinical Impression Statement Pt demonstrating improved coordination with BUE today. Would benefit from warming up hands before coordination activities.   ° OT Occupational Profile and History Detailed Assessment- Review of Records and additional review of physical, cognitive, psychosocial history related to current functional performance   ° Occupational performance deficits (Please refer to evaluation for details): ADL's;IADL's;Leisure;Work;Rest and Sleep   ° Body Structure / Function / Physical Skills ADL;IADL;ROM;Strength;Decreased knowledge of use of DME;Dexterity;GMC;Pain;Tone;UE functional use;Body mechanics;Balance;Continence;FMC;Muscle spasms;Skin integrity;Flexibility;Mobility;Sensation;Improper spinal/pelvic alignment;Endurance   ° Rehab Potential Good   ° Clinical Decision Making Several treatment options, min-mod task modification necessary   ° Comorbidities Affecting Occupational Performance: May have comorbidities impacting occupational performance   ° Modification or Assistance to Complete Evaluation  Min-Moderate modification of tasks or assist with assess necessary to complete eval   ° OT Frequency 2x / week   ° OT Duration 12 weeks   @ renewal 06/16/21  ° OT Treatment/Interventions Self-care/ADL training;Moist Heat;Fluidtherapy;DME and/or AE instruction;Splinting;Therapeutic activities;Aquatic Therapy;Ultrasound;Therapeutic exercise;Cognitive remediation/compensation;Passive range of motion;Functional Mobility Training;Neuromuscular education;Electrical Stimulation;Paraffin;Manual Therapy;Patient/family education   ° Plan LB dressing techniques, bed mobility, coordination BUE, fluido from edge of mat?, hip external rotation ROM   ° Consulted and Agree with Plan of Care Patient;Family member/caregiver   ° Family Member  Consulted spouse [name]   ° °  °  ° °  ° ° °Patient will benefit from skilled therapeutic intervention in order to improve the following deficits and impairments:   °Body Structure / Function / Physical Skills: ADL, IADL, ROM, Strength, Decreased knowledge of use of DME, Dexterity, GMC, Pain, Tone, UE functional use, Body mechanics, Balance, Continence, FMC, Muscle spasms, Skin integrity, Flexibility, Mobility, Sensation, Improper spinal/pelvic alignment, Endurance °  °  ° ° °Visit Diagnosis: °Quadriplegia, C5-C7 incomplete ([facility]) ° °Other abnormalities of gait and mobility ° °Muscle weakness (generalized) ° °Other lack of coordination ° °Other symptoms and signs involving the nervous system ° °Other disturbances of skin sensation ° ° ° °Problem List °Patient Active Problem List  ° Diagnosis Date Noted  ° Quadriplegia, C5-C7 incomplete ([facility]) 01/16/2021  ° History of spinal fracture 01/16/2021  ° Suprapubic catheter ([facility]) 01/16/2021  ° Encounter for routine gynecological examination 09/28/2013  ° Onychomycosis 09/28/2013  ° Foot deformity, acquired 03/26/2012  ° Encounter for preventive health examination 12/25/2010  ° ROSACEA 08/25/2009  ° Disturbance in sleep behavior 03/11/2008  ° SKIN CANCER, HX OF 03/11/2008  ° DYSURIA, HX OF 03/11/2008  ° Hyperlipidemia 02/10/2007  ° CERVICALGIA 02/10/2007  ° ° °Kirstyn M Scarborough, OT °06/23/2021, 10:58 AM ° °[facility] °[facility]   [place], [place], [address] Phone: [phone]   Fax:  [phone]  Name: Kalila Adkison MRN: [id number] Date of Birth: 12/05/1950

## 2021-06-23 NOTE — Therapy (Signed)
[facility] [address] [place], [place], [address] Phone: [phone]   Fax:  [phone]  Physical Therapy Treatment  Patient Details  Name: [name] MRN: [id number] Date of Birth: Nov 26, 1950 Referring Provider (PT): Landis Gandy   Encounter Date: 06/23/2021   PT End of Session - 06/23/21 1103     Visit Number 36    Number of Visits 57    Date for PT Re-Evaluation 08/07/21    Authorization Type [employer]; 36 visits 12/5-2/24/23    Authorization - Visit Number 10    Authorization - Number of Visits 36    Progress Note Due on Visit 38    PT Start Time 1102    PT Stop Time 1148    PT Time Calculation (min) 46 min    Equipment Utilized During Treatment Other (comment)   patient's personal leg loop   Activity Tolerance Patient tolerated treatment well    Behavior During Therapy [facility] for tasks assessed/performed             Past Medical History:  Diagnosis Date   CERVICAL POLYP 03/11/2008   Qualifier: Diagnosis of  By: Regis Bill MD, Standley Brooking    Colon polyps 2005   on colonscopy Dr. Fuller Plan   Fibroid 2004   Per Dr. Ouida Sills   History of shingles    face and mouth   Hx of skin cancer, basal cell    Rosacea    Sciatica of left side 09/28/2013   Scoliosis    noted on mri done for back pain    Past Surgical History:  Procedure Laterality Date   BUNIONECTOMY      There were no vitals filed for this visit.   Subjective Assessment - 06/23/21 1104     Subjective Pt had stepper delivered Friday but they did not set it up for her so her husband is working on it.    Pertinent History Pt also takes Toviaz 4mg  daily. PMH: hyperlipidemia, scoliosis, sleep disorder    Patient Stated Goals Pt would like to be able to walk even if its with assistance. She also wants to be able to type and improve her ability to do ADLs to allow for more independence.    Currently in Pain? Yes    Pain Score 4      Pain Location Arm    Pain Orientation Right;Left    Pain Descriptors / Indicators Aching;Sharp    Pain Type Neuropathic pain    Pain Onset More than a month ago                               [facility] Adult PT Treatment/Exercise - 06/23/21 1105       Bed Mobility   Bed Mobility Rolling Right;Rolling Left    Rolling Right Contact Guard/Touching assist   to assist pt to reach left hand in leg loop on LLE. Pt performed rocking using momentum from arms after LLE flexed up with assist from UE using leg loop.   Rolling Left Set up assist;Minimal Assistance - Patient > 75%   assist to place RLE in to flexion and stabilize it     Transfers   Transfers Lateral/Scoot Transfers    Lateral/Scoot Transfers 5: Supervision;4: Min assist    Lateral/Scoot Transfer Details (indicate cue type and reason) mat to powerchair without slideboard with multiple scoots. PT assisted to place RLE on leg rest prior  to transfer. Pt able to get LLE on leg rest but needed some help with repositioning.      Therapeutic Activites    Therapeutic Activities Other Therapeutic Activities    Other Therapeutic Activities Worked on sidelying to long sit using "C" walk: performed to left first using leg loop to unweight some to get right shoulder under her CGA/min assist then walked around again using leg loop to unweight some initially to bring right arm up or pushing with LUE on mat to walk up some. Once in a C shape straightened LLE out and grabbed it with LUE to rock to be able to push up with RUE CGA after multiple trials with cues to keep head forward. Performed on left first walking down on LUE to side and then supine. Rolled to left: needed mod/max assist to get left shoulder under her, then walked around on arms min assist. From C position min assist to come up. Broke down activity coming down on left forearm and working on pushing through hand to extens arm to rise with grabbing legs with RUE to offweight  slightly. Performed x 4. PT cued to try to get more shoulder extension on right so hand was under her more. Still needed min assist to rise. Also worked on once LUE straightened to throw RUE around to other side to prop. Performed long sit prop walking hands back just before point she could not hold and then walking back up to long sit x 3. Then from propped back position worked on reaching forward with RUE with PT stabilizing on left and then throwing arm behind to support again x 5. Left tricep dip coming partially down to mat and back up x 5. Long sit to short sit with min assist to unweight under feet and PT assisting with UE to move legs to the right off mat. Once heels off pt scooted towards edge a little at a time close SBA requiring increased time as was in center of mat.                       PT Short Term Goals - 06/17/21 1356       PT SHORT TERM GOAL #1   Title Pt will consistently use standing frame as part of HEP, 2-3x/week to facilitate LE WB and trunk control    Time 4    Period Weeks    Status Revised    Target Date 07/12/21      PT SHORT TERM GOAL #2   Title Pt will be able to lateral scoot transfer without slideboard on level surfaces mod I except for assist to reposition feet.    Baseline can perform slideboard MOD I; has begun to perform scooting transfers without slideboard and level and uneven surfaces    Time 4    Period Weeks    Status Revised    Target Date 07/12/21      PT SHORT TERM GOAL #3   Title Pt will be able to roll right consistently with help of left leg loop mod I.    Baseline Intermittent supervision/verbal cues; can perform MOD I 90% of the time    Time 4    Period Weeks    Status Revised    Target Date 07/12/21      PT SHORT TERM GOAL #4   Title Pt will transfer supine to long sit utilizing "C" walk technique to L and R with leg loop and min  A consistently    Baseline mod-max A to L; min-mod A to R with leg loop    Time 4    Period  Weeks    Status Revised    Target Date 07/12/21      PT SHORT TERM GOAL #5   Title Pt will be able to propel manual w/c 250' supervision for improved mobility and aerobic conditioning/strengthening.    Baseline has not been using consistently due to holidays/travel    Time 4    Period Weeks    Status Revised    Target Date 07/12/21      PT SHORT TERM GOAL #6   Title Pt will consistently perform sit > supine on flat mat with leg loop and supervision; will be able to transition long sit > short sit MOD I    Time 4    Period Weeks    Status Revised    Target Date 07/12/21               PT Long Term Goals - 05/14/21 1424       PT LONG TERM GOAL #1   Title Pt will be able to perform short sit to supine transfer utilizing leg loop CGA for improved mobility.    Baseline currently able to get left leg up with loop but max assist for right.    Time 12    Period Weeks    Status New    Target Date 08/07/21      PT LONG TERM GOAL #2   Title Pt will be able to perform lateral scoot transfer without slideboard CGA for improved mobility.    Baseline 05/14/21- Pt still uses slide board and requires PT assist to reposition feet.    Time 12    Period Weeks    Status New    Target Date 08/07/21      PT LONG TERM GOAL #3   Title Pt will be able to stand at counter x 2 min mod assist with PT blocking right leg for improved standing ability/ strength.    Baseline 05/14/21- pt uses standing frame at this time    Time 12    Period Weeks    Status Revised    Target Date 08/07/21      PT LONG TERM GOAL #4   Title Pt will be able to maintain long sit without UE support x 5 min for improved core stability and assist with ADLs.    Time 12    Period Weeks    Status New    Target Date 08/07/21      PT LONG TERM GOAL #5   Title Pt will report being able to perform 20 minutes of manual w/c propulsion around home for improved UE strength and mobility.    Baseline 05/14/21- pt has manual w/c  but has yet to use it    Time 12    Period Weeks    Status On-going    Target Date 08/07/21      Additional Long Term Goals   Additional Long Term Goals Yes      PT LONG TERM GOAL #6   Title Pt will be able to perform squat/pivot transfer mod assist of caregiver for improved mobility to uneven surfaces.    Baseline unable    Time 12    Period Weeks    Status New    Target Date 08/07/21  Plan - 06/23/21 1354     Clinical Impression Statement Pt continues to require less assist to unweight right shoulder for "C" walk to come sidelying to long sit to right. Started working on it some to left which was more challenging.    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[employer] officer Evolving/Moderate complexity    Rehab Potential Good    PT Frequency 3x / week   plus eval   PT Duration 12 weeks    PT Treatment/Interventions ADLs/Self Care Home Management;Electrical Stimulation;DME Instruction;Neuromuscular re-education;Manual techniques;Therapeutic exercise;Balance training;Therapeutic activities;Cryotherapy;Moist Heat;Functional mobility training;Stair training;Gait training;Patient/family education;Orthotic Fit/Training;Wheelchair mobility training;Dry needling;Passive range of motion;Vestibular    PT Next Visit Plan Issue pictures for ER stretching in supine and hooklying as well as RLE hip flex/ext on physioball. Continue slideboard manual w/c <> mat or powerchair and parts management on manual w/c.  Lateral transfer without slideboard powerchair to/from mat for level or downhill, bed mobility. SciFit if has manual chair. Does need supervision to be sure does not tip. Continue to work on functional  strengthening for UE, prone position for scapular strengthening. Continue work on long sit with moving legs on mat, balance with less UE support. Tricep dips using yoga blocks to try to get more lift. Utilizing "C" walk to come from sidelying to long sit.  At some point may try bioness to try to get more quad activation with exercises and possibly with standing frame. Pt does have standing frame at home - have encouraged her to use more daily.    Consulted and Agree with Plan of Care Patient;Family member/caregiver    Family Member Consulted Caregiver - [name]             Patient will benefit from skilled therapeutic intervention in order to improve the following deficits and impairments:  Decreased balance, Decreased mobility, Decreased strength, Impaired sensation, Postural dysfunction, Impaired flexibility, Impaired UE functional use, Impaired tone, Decreased range of motion  Visit Diagnosis: Quadriplegia, C5-C7 incomplete ([place])  Muscle weakness (generalized)     Problem List Patient Active Problem List   Diagnosis Date Noted   Quadriplegia, C5-C7 incomplete ([place]) 01/16/2021   History of spinal fracture 01/16/2021   Suprapubic catheter ([place]) 01/16/2021   Encounter for routine gynecological examination 09/28/2013   Onychomycosis 09/28/2013   Foot deformity, acquired 03/26/2012   Encounter for preventive health examination 12/25/2010   ROSACEA 08/25/2009   Disturbance in sleep behavior 03/11/2008   SKIN CANCER, HX OF 03/11/2008   DYSURIA, HX OF 03/11/2008   Hyperlipidemia 02/10/2007   CERVICALGIA 02/10/2007    Electa Sniff, PT, DPT, NCS 06/23/2021, 1:56 PM  [facility] [address] [place], [place], [address] Phone: [phone]   Fax:  [phone]  Name: [name] MRN: [id number] Date of Birth: 09-17-1950

## 2021-06-24 ENCOUNTER — Ambulatory Visit: Payer: Medicare PPO | Admitting: Occupational Therapy

## 2021-06-24 ENCOUNTER — Encounter: Payer: Self-pay | Admitting: Occupational Therapy

## 2021-06-24 ENCOUNTER — Ambulatory Visit: Payer: Medicare PPO

## 2021-06-24 DIAGNOSIS — R2689 Other abnormalities of gait and mobility: Secondary | ICD-10-CM | POA: Diagnosis not present

## 2021-06-24 DIAGNOSIS — G8254 Quadriplegia, C5-C7 incomplete: Secondary | ICD-10-CM | POA: Diagnosis not present

## 2021-06-24 DIAGNOSIS — R29818 Other symptoms and signs involving the nervous system: Secondary | ICD-10-CM | POA: Diagnosis not present

## 2021-06-24 DIAGNOSIS — R208 Other disturbances of skin sensation: Secondary | ICD-10-CM

## 2021-06-24 DIAGNOSIS — M6281 Muscle weakness (generalized): Secondary | ICD-10-CM | POA: Diagnosis not present

## 2021-06-24 DIAGNOSIS — R278 Other lack of coordination: Secondary | ICD-10-CM

## 2021-06-24 DIAGNOSIS — R293 Abnormal posture: Secondary | ICD-10-CM | POA: Diagnosis not present

## 2021-06-24 NOTE — Therapy (Signed)
[facility] [address] [place], [place], [address] Phone: [phone]   Fax:  [phone]  Occupational Therapy Treatment  Patient Details  Name: [name] MRN: [id number] Date of Birth: Sep 18, 1950 Referring Provider (OT): Ina Homes, MD   Encounter Date: 06/24/2021   OT End of Session - 06/24/21 1102     Visit Number 27    Number of Visits 48   +24 visits at renewal 06/16/21   Date for OT Re-Evaluation 09/08/21   add at renewal   [place] Medicare    Authorization Time Period Auth Req'd - 25 visits 05/06/21 - 07/19/21    Authorization - Visit Number 24    Authorization - Number of Visits 41   16+25   Progress Note Due on Visit 34    OT Start Time 1101    OT Stop Time 1145    OT Time Calculation (min) 44 min    Activity Tolerance Patient tolerated treatment well    Behavior During Therapy [facility] for tasks assessed/performed             Past Medical History:  Diagnosis Date   CERVICAL POLYP 03/11/2008   Qualifier: Diagnosis of  By: Regis Bill MD, Standley Brooking    Colon polyps 2005   on colonscopy Dr. Fuller Plan   Fibroid 2004   Per Dr. Ouida Sills   History of shingles    face and mouth   Hx of skin cancer, basal cell    Rosacea    Sciatica of left side 09/28/2013   Scoliosis    noted on mri done for back pain    Past Surgical History:  Procedure Laterality Date   BUNIONECTOMY      There were no vitals filed for this visit.   Subjective Assessment - 06/24/21 1102     Subjective  "we brought earrings again today"    Patient is accompanied by: --   caregiver   Pertinent History hyperlipidemia, scoliosis, sleep disorder    Patient Stated Goals regain arm strength for rolling over in bed and fine motor coordination for increasing typing    Currently in Pain? Yes    Pain Score 4     Pain Location Arm    Pain Orientation Right;Left    Pain Descriptors / Indicators Aching;Sharp     Pain Type Neuropathic pain    Pain Onset More than a month ago    Pain Frequency Constant             Hot Pack x 8 minutes for warming up BUE hands for stiffness prior to coordination activities. Volar and dorsal.  Nuts and Bolts with using BUE and alternating between stabilizing and twisting. Placed nuts and bolts back together off of board for increased bimanual coordination and fine motor coordination                    OT Short Term Goals - 06/16/21 1559       OT SHORT TERM GOAL #1   Title Pt will be independent with HEP w CG assistance PRN    Time 4    Period Weeks    Status Achieved    Target Date 04/13/21      OT SHORT TERM GOAL #2   Title Pt will verbalize understanding and report independence with CG assistance with ues of modalities at home with good safety.    Baseline has paraffin and Estim unit  Time 4    Period Weeks    Status Achieved   pt has new caregiver she is training for paraffin - reviewed and demonstrated understanding of estim unit     OT SHORT TERM GOAL #3   Title Pt will increase Box and Blocks score with RUE to 30 blocks or greater    Baseline R 26 L 39    Time 4    Period Weeks    Status Deferred   R 22 blocks  04/16/21, 5 blocks RUE 05/21/21 d/t finger cramping, 15 blocks 06/16/21 - deferred to updated LTG     OT SHORT TERM GOAL #4   Title Pt will increase BUE tricep strength to 4/5 consistently for increasing ability to perform SB transfers with supervision/set up assistance only.    Baseline 3+/5 strength BUE    Time 4    Period Weeks    Status Achieved      OT SHORT TERM GOAL #5   Title Pt will increase coordination in LUE to completing 9 hole peg test in 70 seconds or less.    Baseline L 75.13s, R unable    Time 4    Period Weeks    Status Achieved   L 63.87s 04/16/21     OT SHORT TERM GOAL #6   Title Pt will verbalize understanding of adapted strategies and/or equipment for increasing indepenence with ADLs and  IADLs (typing, bathing, cutting food, etc)    Baseline has U cuff    Time 4    Period Weeks    Status Achieved   has verbalized understanding of a lot of AE but continues to benefit from education              OT Long Term Goals - 06/16/21 1438       OT LONG TERM GOAL #1   Title Pt will be independence with any updated HEP    Time 12    Period Weeks    Status On-going    Target Date 09/08/21      OT LONG TERM GOAL #2   Title Pt will increase functional use of BUE evidenced by completing Box and Blocks with score of 30 or greater with RUE, 45 or greater with LUE.    Baseline R 26, L 39    Time 12    Period Weeks    Status Revised   15 blocks 06/16/21   Target Date 09/08/21      OT LONG TERM GOAL #3   Title PT will improve grip strength in BUE by increasing grip strength to 10 lbs or greater with RUE and 20 lbs or greater with LUE.    Baseline R 1.5, L 12.3    Time 12    Period Weeks    Status On-going   RUE 3.3 lbs, LUE 14.5 lbs     OT LONG TERM GOAL #4   Title Pt will improve isolated finger movements in order to increase skill towards simple typing with adapted strategies and equipment PRN.    Baseline isolated movement in LUE    Time 12    Period Weeks    Status On-going      OT LONG TERM GOAL #5   Title Pt will improve 9 hole peg test in LUE to completing in 65 seconds or less in order to increase functional use and demonstrate ability to place 2 or more pegs with RUE.    Baseline L 75.13s, R unable  Time 12    Period Weeks    Status Partially Met   71.19s LUE 05/21/21, was able to place 2 pegs with RUE 05/21/21     OT LONG TERM GOAL #6   Title Pt will report completing UB and LB dressing with decreased assistance consistently.    Baseline UB (reports able to do but not consistently doing), LB total A    Time 12    Period Weeks    Status On-going                   Plan - 06/24/21 1108     Clinical Impression Statement Pt continues to improve  and progress towards goals.    OT Occupational Profile and History Detailed Assessment- Review of Records and additional review of physical, cognitive, psychosocial history related to current functional performance    Occupational performance deficits (Please refer to evaluation for details): ADL's;IADL's;Leisure;Work;Rest and Sleep    Body Structure / Function / Physical Skills ADL;IADL;ROM;Strength;Decreased knowledge of use of DME;Dexterity;GMC;Pain;Tone;UE functional use;Body mechanics;Balance;Continence;FMC;Muscle spasms;Skin integrity;Flexibility;Mobility;Sensation;Improper spinal/pelvic alignment;Endurance    Rehab Potential Good    Clinical Decision Making Several treatment options, min-mod task modification necessary    Comorbidities Affecting Occupational Performance: May have comorbidities impacting occupational performance    Modification or Assistance to Complete Evaluation  Min-Moderate modification of tasks or assist with assess necessary to complete eval    OT Frequency 2x / week    OT Duration 12 weeks   @ renewal 06/16/21   OT Treatment/Interventions Self-care/ADL training;Moist Heat;Fluidtherapy;DME and/or AE instruction;Splinting;Therapeutic activities;Aquatic Therapy;Ultrasound;Therapeutic exercise;Cognitive remediation/compensation;Passive range of motion;Functional Mobility Training;Neuromuscular education;Electrical Stimulation;Paraffin;Manual Therapy;Patient/family education    Plan LB dressing techniques, bed mobility, coordination BUE, fluido from edge of mat?, hip external rotation ROM    Consulted and Agree with Plan of Care Patient;Family member/caregiver    Family Member Consulted spouse [name]             Patient will benefit from skilled therapeutic intervention in order to improve the following deficits and impairments:   Body Structure / Function / Physical Skills: ADL, IADL, ROM, Strength, Decreased knowledge of use of DME, Dexterity, GMC, Pain, Tone, UE  functional use, Body mechanics, Balance, Continence, FMC, Muscle spasms, Skin integrity, Flexibility, Mobility, Sensation, Improper spinal/pelvic alignment, Endurance       Visit Diagnosis: Quadriplegia, C5-C7 incomplete ([facility])  Other abnormalities of gait and mobility  Muscle weakness (generalized)  Other lack of coordination  Other symptoms and signs involving the nervous system  Other disturbances of skin sensation    Problem List Patient Active Problem List   Diagnosis Date Noted   Quadriplegia, C5-C7 incomplete ([place]) 01/16/2021   History of spinal fracture 01/16/2021   Suprapubic catheter ([place]) 01/16/2021   Encounter for routine gynecological examination 09/28/2013   Onychomycosis 09/28/2013   Foot deformity, acquired 03/26/2012   Encounter for preventive health examination 12/25/2010   ROSACEA 08/25/2009   Disturbance in sleep behavior 03/11/2008   SKIN CANCER, HX OF 03/11/2008   DYSURIA, HX OF 03/11/2008   Hyperlipidemia 02/10/2007   CERVICALGIA 02/10/2007    Zachery Conch, OT 06/24/2021, 12:53 PM  [facility] [address] [place], [place], [address] Phone: [phone]   Fax:  [phone]  Name: Reita Shindler MRN: [id number] Date of Birth: Nov 05, 1950

## 2021-06-24 NOTE — Therapy (Signed)
[place] [address] [place], [place], [address] Phone: [phone]   Fax:  [phone]  Physical Therapy Treatment  Patient Details  Name: [name] MRN: [id number] Date of Birth: 1950-08-13 Referring Provider (PT): Landis Gandy   Encounter Date: 06/24/2021   PT End of Session - 06/24/21 1146     Visit Number 37    Number of Visits 25    Date for PT Re-Evaluation 08/07/21    Authorization Type human[employer] medicare; 36 visits 12/5-2/24/23    Authorization - Visit Number 11    Authorization - Number of Visits 36    Progress Note Due on Visit 35    PT Start Time 1147    PT Stop Time 1232    PT Time Calculation (min) 45 min    Equipment Utilized During Treatment Other (comment)   patient's personal leg loop   Activity Tolerance Patient tolerated treatment well    Behavior During Therapy [facility] for tasks assessed/performed             Past Medical History:  Diagnosis Date   CERVICAL POLYP 03/11/2008   Qualifier: Diagnosis of  By: Regis Bill MD, Standley Brooking    Colon polyps 2005   on colonscopy Dr. Fuller Plan   Fibroid 2004   Per Dr. Ouida Sills   History of shingles    face and mouth   Hx of skin cancer, basal cell    Rosacea    Sciatica of left side 09/28/2013   Scoliosis    noted on mri done for back pain    Past Surgical History:  Procedure Laterality Date   BUNIONECTOMY      There were no vitals filed for this visit.   Subjective Assessment - 06/24/21 1147     Subjective Pt reports that she is a bit sleep deprived. Husband had TV on really loud.    Pertinent History Pt also takes Toviaz 4mg  daily. PMH: hyperlipidemia, scoliosis, sleep disorder    Patient Stated Goals Pt would like to be able to walk even if its with assistance. She also wants to be able to type and improve her ability to do ADLs to allow for more independence.    Currently in Pain? Yes    Pain Score 4     Pain Location Arm     Pain Orientation Right;Left    Pain Descriptors / Indicators Aching;Sharp    Pain Type Neuropathic pain    Pain Onset More than a month ago    Pain Frequency Constant                               [facility] Adult PT Treatment/Exercise - 06/24/21 1149       Transfers   Transfers Lateral/Scoot Transfers    Lateral/Scoot Transfers 5: Supervision;4: Min assist    Lateral/Scoot Transfer Details (indicate cue type and reason) powerchair to mat with leg loop on left thigh. Pt was able to reposition feet with only CGA on RLE utilizing leg loop to lift with both arms to get foot off leg rest. Able to kick LLE out to get off. With return PT provided min assist to get RLE on foot plate. Pt performed multiple scoots for transfers.      Therapeutic Activites    Therapeutic Activities Other Therapeutic Activities    Other Therapeutic Activities Short sit to long sit with PT assisting at legs min assist on  left and total assist on right. Pt able to maintain propping on arms and pivot around. Long sit to scooting edge of mat with pt moving legs CGA under heels to reduce friction with UE assist.      Exercises   Exercises Other Exercises    Other Exercises  Sitting edge of mat: LAQ on LLE with 2# weight 5 x 3, stepping LLE out to side and back 10 x 3 with 3# weight, isometric hip adduction bilateral 5 x 3. Pt able to get some initiation on right leg with good activation on left. Supine on mat: RLE on red physioball with PT stabilizing AAROM in to hip flexion max assist and then resistance to facilitate movement in to extension 5 x 2. Pt able to get more activation at gluts pushing some in to extension. LLE AAROM in to hip flexion min assist to initiate and then resistance into extension 5 x 3. Pt able to tolerate increased resistance on left today. LLE in hooklying: isometric hip abd/add 5 x 2 with 3 sec holds, hip abd/ER then hip add/IR 5 x 2 having pt control movement throughout. Legs over  bolster: partial bridges x 5. Pt able to get some overflow into quads on RLE as well. Also cued pt to try to engage core some. Pt able to get slight lift. Discussed adding bridge over bolster to HEP.                     PT Education - 06/24/21 1408     Education Details Discussed adding in bridge over a rolled up blanket.    Person(s) Educated Patient;Caregiver(s)    Methods Explanation;Demonstration    Comprehension Verbalized understanding;Returned demonstration              PT Short Term Goals - 06/17/21 1356       PT SHORT TERM GOAL #1   Title Pt will consistently use standing frame as part of HEP, 2-3x/week to facilitate LE WB and trunk control    Time 4    Period Weeks    Status Revised    Target Date 07/12/21      PT SHORT TERM GOAL #2   Title Pt will be able to lateral scoot transfer without slideboard on level surfaces mod I except for assist to reposition feet.    Baseline can perform slideboard MOD I; has begun to perform scooting transfers without slideboard and level and uneven surfaces    Time 4    Period Weeks    Status Revised    Target Date 07/12/21      PT SHORT TERM GOAL #3   Title Pt will be able to roll right consistently with help of left leg loop mod I.    Baseline Intermittent supervision/verbal cues; can perform MOD I 90% of the time    Time 4    Period Weeks    Status Revised    Target Date 07/12/21      PT SHORT TERM GOAL #4   Title Pt will transfer supine to long sit utilizing "C" walk technique to L and R with leg loop and min A consistently    Baseline mod-max A to L; min-mod A to R with leg loop    Time 4    Period Weeks    Status Revised    Target Date 07/12/21      PT SHORT TERM GOAL #5   Title Pt will be able to propel  manual w/c 250' supervision for improved mobility and aerobic conditioning/strengthening.    Baseline has not been using consistently due to holidays/travel    Time 4    Period Weeks    Status Revised     Target Date 07/12/21      PT SHORT TERM GOAL #6   Title Pt will consistently perform sit > supine on flat mat with leg loop and supervision; will be able to transition long sit > short sit MOD I    Time 4    Period Weeks    Status Revised    Target Date 07/12/21               PT Long Term Goals - 05/14/21 1424       PT LONG TERM GOAL #1   Title Pt will be able to perform short sit to supine transfer utilizing leg loop CGA for improved mobility.    Baseline currently able to get left leg up with loop but max assist for right.    Time 12    Period Weeks    Status New    Target Date 08/07/21      PT LONG TERM GOAL #2   Title Pt will be able to perform lateral scoot transfer without slideboard CGA for improved mobility.    Baseline 05/14/21- Pt still uses slide board and requires PT assist to reposition feet.    Time 12    Period Weeks    Status New    Target Date 08/07/21      PT LONG TERM GOAL #3   Title Pt will be able to stand at counter x 2 min mod assist with PT blocking right leg for improved standing ability/ strength.    Baseline 05/14/21- pt uses standing frame at this time    Time 12    Period Weeks    Status Revised    Target Date 08/07/21      PT LONG TERM GOAL #4   Title Pt will be able to maintain long sit without UE support x 5 min for improved core stability and assist with ADLs.    Time 12    Period Weeks    Status New    Target Date 08/07/21      PT LONG TERM GOAL #5   Title Pt will report being able to perform 20 minutes of manual w/c propulsion around home for improved UE strength and mobility.    Baseline 05/14/21- pt has manual w/c but has yet to use it    Time 12    Period Weeks    Status On-going    Target Date 08/07/21      Additional Long Term Goals   Additional Long Term Goals Yes      PT LONG TERM GOAL #6   Title Pt will be able to perform squat/pivot transfer mod assist of caregiver for improved mobility to uneven surfaces.     Baseline unable    Time 12    Period Weeks    Status New    Target Date 08/07/21                   Plan - 06/24/21 1409     Clinical Impression Statement PT focused on leg strengthening today. LLE continues to show progress with increasing reps with 2# ankle weight seated today. Does need to rest between. RLE showing some hip extensor activation with pushing down in to extension as well  as hip adductor activation.    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[employer] officer Evolving/Moderate complexity    Rehab Potential Good    PT Frequency 3x / week   plus eval   PT Duration 12 weeks    PT Treatment/Interventions ADLs/Self Care Home Management;Electrical Stimulation;DME Instruction;Neuromuscular re-education;Manual techniques;Therapeutic exercise;Balance training;Therapeutic activities;Cryotherapy;Moist Heat;Functional mobility training;Stair training;Gait training;Patient/family education;Orthotic Fit/Training;Wheelchair mobility training;Dry needling;Passive range of motion;Vestibular    PT Next Visit Plan Issue pictures for ER stretching in supine and hooklying as well as RLE hip flex/ext on physioball and bridge. I have updated HEP so just need to print out those. Next visit focus more on arm strengthening. Trying to cycle treatments-arms, functional mobility, leg strengthening.  Continue slideboard manual w/c <> mat or powerchair and parts management on manual w/c.  Lateral transfer without slideboard powerchair to/from mat for level or downhill, bed mobility. SciFit if has manual chair. Does need supervision to be sure does not tip. Continue to work on functional strengthening for UE, prone position for scapular  strengthening. Continue work on long sit with moving legs on mat, balance with less UE support. Tricep dips using yoga blocks to try to get more lift. Utilizing "C" walk to come from sidelying to long sit.  At some point may try bioness to try to get more quad activation with exercises and possibly with standing frame.    Consulted and Agree with Plan of Care Patient;Family member/caregiver    Family Member Consulted Caregiver - [name]             Patient will benefit from skilled therapeutic intervention in order to improve the following deficits and impairments:  Decreased balance, Decreased mobility, Decreased strength, Impaired sensation, Postural dysfunction, Impaired flexibility, Impaired UE functional use, Impaired tone, Decreased range of motion  Visit Diagnosis: Muscle weakness (generalized)  Quadriplegia, C5-C7 incomplete ([place])     Problem List Patient Active Problem List   Diagnosis Date Noted   Quadriplegia, C5-C7 incomplete ([place]) 01/16/2021   History of spinal fracture 01/16/2021   Suprapubic catheter ([place]) 01/16/2021   Encounter for routine gynecological examination 09/28/2013   Onychomycosis 09/28/2013   Foot deformity, acquired 03/26/2012   Encounter for preventive health examination 12/25/2010   ROSACEA 08/25/2009   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 [place], [place], [address] Phone: [phone]   Fax:  [phone]  Name: [name] MRN: [id number] Date of Birth: 15-Sep-1950

## 2021-06-25 ENCOUNTER — Ambulatory Visit: Payer: Medicare PPO

## 2021-06-25 ENCOUNTER — Encounter: Payer: Medicare PPO | Admitting: Occupational Therapy

## 2021-06-25 NOTE — Progress Notes (Signed)
Subjective:  Patient ID: [name], female    DOB: [birth year],  MRN: [id number]  Chief Complaint  Patient presents with   Wound Check    F/U BL wound check -per pt Lt 5th toe scabb came off and looks better but Rt toe looks same." - no redness/swelling/drainage Tx: mupirocin ointment    71 y.o. female presents with the above complaint. History confirmed with patient.  Continues to improve but the wounds are still present she is still wearing the open ended compression stockings  Objective:  Physical Exam: The feet have significant +2 to +3 pitting edema, cyanosis and pallor with palpation although her dorsalis pedis pulses still weakly palpable.  Right side lateral fifth met ulcer has healed with minor hyperkeratosis.  Right fifth PIPJ ulcer has healed essentially at this point only a small area about 2 mm in size with hyperkeratosis and scab.  Left fifth PIPJ still measures about 3 mm in diameter and 2 mm deep with subcutaneous exposed tissue.   ABIs reviewed good flow with excellent waveforms        Assessment:   1. Pressure injury of toe, unstageable, unspecified laterality ([place])       Plan:  Patient was evaluated and treated and all questions answered.  Only the left DIPJ ulceration now remains.  I debrided the overlying hyperkeratosis today from the right fifth and the left fifth toe and there is still a small ulcer on the left fifth.  I recommend they continue mupirocin ointment.  Rolling back to compression stockings seems to have helped as well.  Continue offloading of all pressure.  I will see her back in 8 weeks or sooner if there are problems or issues, I reviewed signs symptoms of infection that they we will keep an eye out for.  Return in about 8 weeks (around 08/18/2021) for wound care.

## 2021-06-26 ENCOUNTER — Other Ambulatory Visit: Payer: Self-pay

## 2021-06-26 ENCOUNTER — Ambulatory Visit: Payer: Medicare PPO

## 2021-06-26 DIAGNOSIS — G8254 Quadriplegia, C5-C7 incomplete: Secondary | ICD-10-CM

## 2021-06-26 DIAGNOSIS — R29818 Other symptoms and signs involving the nervous system: Secondary | ICD-10-CM | POA: Diagnosis not present

## 2021-06-26 DIAGNOSIS — H353112 Nonexudative age-related macular degeneration, right eye, intermediate dry stage: Secondary | ICD-10-CM | POA: Diagnosis not present

## 2021-06-26 DIAGNOSIS — R278 Other lack of coordination: Secondary | ICD-10-CM | POA: Diagnosis not present

## 2021-06-26 DIAGNOSIS — R208 Other disturbances of skin sensation: Secondary | ICD-10-CM | POA: Diagnosis not present

## 2021-06-26 DIAGNOSIS — R2689 Other abnormalities of gait and mobility: Secondary | ICD-10-CM | POA: Diagnosis not present

## 2021-06-26 DIAGNOSIS — R293 Abnormal posture: Secondary | ICD-10-CM | POA: Diagnosis not present

## 2021-06-26 DIAGNOSIS — M6281 Muscle weakness (generalized): Secondary | ICD-10-CM

## 2021-06-26 NOTE — Therapy (Signed)
[place] [address] [place], [place], [address] Phone: [phone]   Fax:  [phone]  Physical Therapy Treatment  Patient Details  Name: [name] MRN: [id number] Date of Birth: 05-11-1951 Referring Provider (PT): Landis Gandy   Encounter Date: 06/26/2021   PT End of Session - 06/26/21 1108     Visit Number 38    Number of Visits 49    Date for PT Re-Evaluation 08/07/21    Authorization Type humana medicare; 36 visits 12/5-2/24/23    Authorization - Visit Number 12    Authorization - Number of Visits 36    Progress Note Due on Visit 40    PT Start Time 1106    PT Stop Time 1150    PT Time Calculation (min) 44 min    Equipment Utilized During Treatment Other (comment)   patient's personal leg loop   Activity Tolerance Patient tolerated treatment well    Behavior During Therapy [facility] for tasks assessed/performed             Past Medical History:  Diagnosis Date   CERVICAL POLYP 03/11/2008   Qualifier: Diagnosis of  By: Regis Bill MD, Standley Brooking    Colon polyps 2005   on colonscopy Dr. Fuller Plan   Fibroid 2004   Per Dr. Ouida Sills   History of shingles    face and mouth   Hx of skin cancer, basal cell    Rosacea    Sciatica of left side 09/28/2013   Scoliosis    noted on mri done for back pain    Past Surgical History:  Procedure Laterality Date   BUNIONECTOMY      There were no vitals filed for this visit.   Subjective Assessment - 06/26/21 1108     Subjective Pt reports that she used her SciFit set up and used 6 min yesterday in 2 min intervals. Reports that the ramp is a little scary but did good.    Pertinent History Pt also takes Toviaz 4mg  daily. PMH: hyperlipidemia, scoliosis, sleep disorder    Patient Stated Goals Pt would like to be able to walk even if its with assistance. She also wants to be able to type and improve her ability to do ADLs to allow for more independence.     Currently in Pain? Yes    Pain Score 4     Pain Location Arm    Pain Orientation Right;Left    Pain Descriptors / Indicators Aching;Sharp    Pain Type Neuropathic pain    Pain Onset More than a month ago                               [facility] Adult PT Treatment/Exercise - 06/26/21 1109       Bed Mobility   Rolling Left Set up assist   to flex right leg up, Pt utilized arms for momentum to roll to left     Transfers   Transfers Lateral/Scoot Transfers    Lateral/Scoot Transfers 5: Supervision;4: Min assist    Lateral/Scoot Transfer Details (indicate cue type and reason) Pt needed help to reposition RLE with transfer to/from mat from [place] Other Therapeutic Activities    Other Therapeutic Activities Left sidelying to prone min assist to get over left shoulder. From prone back to supine again only min assist to get left arm  under her with pt pushing over with RUE. PT repositioned legs. In prone: propping on elbows with min/mod assist to get over shoulders initially, pt then performed scapular retraction/protraction 10 x 2 with pillow under chest to rest between sets. Then worked on trying to walk out on forearms to each side x 2 min assist needing mod/max assist with this as having trouble unweighting shoulders. Worked just on weight shifting side to side x 5 then PT unweighted some under chest and pt repeated walking to left 2 steps on forearms and back x 2 resting in between. With PT unweighting some pt was able to perform with less assist. Most challenged with horizontal adduction. In left sidelying worked on horizontal adduction or right shoulder with right hand on purple ball with arm extended with  isometric pushes down on ball x 10 with 5 sec holds.      Neuro Re-ed    Neuro Re-ed Details  Seated edge of mat: holding purple ball between hands in front and rotating side to side x 10 then tapping ball down to mat  on each side x 5 each to work on sitting balance and core activation. Then holding purple ball in front and leaning back as far as she could without losing balance posterior and coming back upright 5 x 2. Cued to try to come up as straight as possible but still having dificulty getting any anterior pelvic tilt.  PT providing close supervision for safety.                       PT Short Term Goals - 06/17/21 1356       PT SHORT TERM GOAL #1   Title Pt will consistently use standing frame as part of HEP, 2-3x/week to facilitate LE WB and trunk control    Time 4    Period Weeks    Status Revised    Target Date 07/12/21      PT SHORT TERM GOAL #2   Title Pt will be able to lateral scoot transfer without slideboard on level surfaces mod I except for assist to reposition feet.    Baseline can perform slideboard MOD I; has begun to perform scooting transfers without slideboard and level and uneven surfaces    Time 4    Period Weeks    Status Revised    Target Date 07/12/21      PT SHORT TERM GOAL #3   Title Pt will be able to roll right consistently with help of left leg loop mod I.    Baseline Intermittent supervision/verbal cues; can perform MOD I 90% of the time    Time 4    Period Weeks    Status Revised    Target Date 07/12/21      PT SHORT TERM GOAL #4   Title Pt will transfer supine to long sit utilizing "C" walk technique to L and R with leg loop and min A consistently    Baseline mod-max A to L; min-mod A to R with leg loop    Time 4    Period Weeks    Status Revised    Target Date 07/12/21      PT SHORT TERM GOAL #5   Title Pt will be able to propel manual w/c 250' supervision for improved mobility and aerobic conditioning/strengthening.    Baseline has not been using consistently due to holidays/travel    Time 4    Period Weeks  Status Revised    Target Date 07/12/21      PT SHORT TERM GOAL #6   Title Pt will consistently perform sit > supine on flat  mat with leg loop and supervision; will be able to transition long sit > short sit MOD I    Time 4    Period Weeks    Status Revised    Target Date 07/12/21               PT Long Term Goals - 05/14/21 1424       PT LONG TERM GOAL #1   Title Pt will be able to perform short sit to supine transfer utilizing leg loop CGA for improved mobility.    Baseline currently able to get left leg up with loop but max assist for right.    Time 12    Period Weeks    Status New    Target Date 08/07/21      PT LONG TERM GOAL #2   Title Pt will be able to perform lateral scoot transfer without slideboard CGA for improved mobility.    Baseline 05/14/21- Pt still uses slide board and requires PT assist to reposition feet.    Time 12    Period Weeks    Status New    Target Date 08/07/21      PT LONG TERM GOAL #3   Title Pt will be able to stand at counter x 2 min mod assist with PT blocking right leg for improved standing ability/ strength.    Baseline 05/14/21- pt uses standing frame at this time    Time 12    Period Weeks    Status Revised    Target Date 08/07/21      PT LONG TERM GOAL #4   Title Pt will be able to maintain long sit without UE support x 5 min for improved core stability and assist with ADLs.    Time 12    Period Weeks    Status New    Target Date 08/07/21      PT LONG TERM GOAL #5   Title Pt will report being able to perform 20 minutes of manual w/c propulsion around home for improved UE strength and mobility.    Baseline 05/14/21- pt has manual w/c but has yet to use it    Time 12    Period Weeks    Status On-going    Target Date 08/07/21      Additional Long Term Goals   Additional Long Term Goals Yes      PT LONG TERM GOAL #6   Title Pt will be able to perform squat/pivot transfer mod assist of caregiver for improved mobility to uneven surfaces.    Baseline unable    Time 12    Period Weeks    Status New    Target Date 08/07/21                    Plan - 06/26/21 1430     Clinical Impression Statement Pt continued to show improvement with sitting balance correctly small LOB on own with only supervision. Pt does well in prone with scapular protraction/retraction but does have difficulty unweighting one shoulder to be able to try to walker on forearms to each side with the horizontal adduction component being hardest.    Personal Factors and Comorbidities Comorbidity 2    Comorbidities scoliosis and sleep disorder    Examination-Activity Limitations Bed Mobility;Locomotion  Level;Transfers;Stand;Bathing;Dressing    Examination-Participation Freight forwarder;Yard Work    Merchant navy officer Evolving/Moderate complexity    Rehab Potential Good    PT Frequency 3x / week   plus eval   PT Duration 12 weeks    PT Treatment/Interventions ADLs/Self Care Home Management;Electrical Stimulation;DME Instruction;Neuromuscular re-education;Manual techniques;Therapeutic exercise;Balance training;Therapeutic activities;Cryotherapy;Moist Heat;Functional mobility training;Stair training;Gait training;Patient/family education;Orthotic Fit/Training;Wheelchair mobility training;Dry needling;Passive range of motion;Vestibular    PT Next Visit Plan Issue pictures for ER stretching in supine and hooklying as well as RLE hip flex/ext on physioball and bridge. I have updated HEP so just need to print out those. Next visit focus more on functional mobility.. Trying to cycle treatments-arms, functional mobility, leg strengthening.  Continue slideboard manual w/c <> mat or powerchair and parts management on manual w/c.  Lateral transfer without slideboard powerchair to/from mat for level or downhill, bed mobility. SciFit if has manual chair. Does need supervision to be sure does not tip. Continue to work on functional strengthening for UE, prone position for scapular strengthening-try unweighting with sheet under  chest to be able to walk side to side on forearms. Continue work on long sit with moving legs on mat, balance with less UE support. Tricep dips using yoga blocks to try to get more lift. Utilizing "C" walk to come from sidelying to long sit.  At some point may try bioness to try to get more quad activation with exercises and possibly with standing frame.    Consulted and Agree with Plan of Care Patient;Family member/caregiver    Family Member Consulted Caregiver - [name]             Patient will benefit from skilled therapeutic intervention in order to improve the following deficits and impairments:  Decreased balance, Decreased mobility, Decreased strength, Impaired sensation, Postural dysfunction, Impaired flexibility, Impaired UE functional use, Impaired tone, Decreased range of motion  Visit Diagnosis: Quadriplegia, C5-C7 incomplete ([place])  Muscle weakness (generalized)     Problem List Patient Active Problem List   Diagnosis Date Noted   Quadriplegia, C5-C7 incomplete ([place]) 01/16/2021   History of spinal fracture 01/16/2021   Suprapubic catheter ([place]) 01/16/2021   Encounter for routine gynecological examination 09/28/2013   Onychomycosis 09/28/2013   Foot deformity, acquired 03/26/2012   Encounter for preventive health examination 12/25/2010   ROSACEA 08/25/2009   Disturbance in sleep behavior 03/11/2008   SKIN CANCER, HX OF 03/11/2008   DYSURIA, HX OF 03/11/2008   Hyperlipidemia 02/10/2007   CERVICALGIA 02/10/2007    Electa Sniff, PT, DPT, NCS 06/26/2021, 2:33 PM  [place] [address] [place], [place], [address] Phone: [phone]   Fax:  [phone]  Name: [name] MRN: [id number] Date of Birth: 1951-05-09

## 2021-06-30 ENCOUNTER — Other Ambulatory Visit: Payer: Self-pay

## 2021-06-30 ENCOUNTER — Encounter: Payer: Self-pay | Admitting: Occupational Therapy

## 2021-06-30 ENCOUNTER — Ambulatory Visit: Payer: Medicare PPO | Admitting: Occupational Therapy

## 2021-06-30 ENCOUNTER — Ambulatory Visit: Payer: Medicare PPO

## 2021-06-30 DIAGNOSIS — R29818 Other symptoms and signs involving the nervous system: Secondary | ICD-10-CM

## 2021-06-30 DIAGNOSIS — N312 Flaccid neuropathic bladder, not elsewhere classified: Secondary | ICD-10-CM | POA: Diagnosis not present

## 2021-06-30 DIAGNOSIS — G8254 Quadriplegia, C5-C7 incomplete: Secondary | ICD-10-CM | POA: Diagnosis not present

## 2021-06-30 DIAGNOSIS — M6281 Muscle weakness (generalized): Secondary | ICD-10-CM | POA: Diagnosis not present

## 2021-06-30 DIAGNOSIS — R208 Other disturbances of skin sensation: Secondary | ICD-10-CM | POA: Diagnosis not present

## 2021-06-30 DIAGNOSIS — R293 Abnormal posture: Secondary | ICD-10-CM | POA: Diagnosis not present

## 2021-06-30 DIAGNOSIS — N3000 Acute cystitis without hematuria: Secondary | ICD-10-CM | POA: Diagnosis not present

## 2021-06-30 DIAGNOSIS — R2689 Other abnormalities of gait and mobility: Secondary | ICD-10-CM | POA: Diagnosis not present

## 2021-06-30 DIAGNOSIS — R278 Other lack of coordination: Secondary | ICD-10-CM | POA: Diagnosis not present

## 2021-06-30 NOTE — Therapy (Signed)
[facility] [address] [place], [place], [address] Phone: [phone]   Fax:  [phone]  Physical Therapy Treatment  Patient Details  Name: [name] MRN: [id number] Date of Birth: [birth year] Referring Provider (PT): Landis Gandy   Encounter Date: 06/30/2021   PT End of Session - 06/30/21 1102     Visit Number 39    Number of Visits 73    Date for PT Re-Evaluation 08/07/21    Authorization Type [employer]; 36 visits 12/5-2/24/23    Authorization - Visit Number 13    Authorization - Number of Visits 36    Progress Note Due on Visit 29    PT Start Time 1101    PT Stop Time 1148    PT Time Calculation (min) 47 min    Equipment Utilized During Treatment Other (comment)   patient's personal leg loop   Activity Tolerance Patient tolerated treatment well    Behavior During Therapy [facility] for tasks assessed/performed             Past Medical History:  Diagnosis Date   CERVICAL POLYP 03/11/2008   Qualifier: Diagnosis of  By: Regis Bill MD, Standley Brooking    Colon polyps 2005   on colonscopy Dr. Fuller Plan   Fibroid 2004   Per Dr. Ouida Sills   History of shingles    face and mouth   Hx of skin cancer, basal cell    Rosacea    Sciatica of left side 09/28/2013   Scoliosis    noted on mri done for back pain    Past Surgical History:  Procedure Laterality Date   BUNIONECTOMY      There were no vitals filed for this visit.   Subjective Assessment - 06/30/21 1102     Subjective Pt reports she called in on Saturday and they started her on Nitrofuraton prophylactically for UTI. Goes to urologist today.    Pertinent History Pt also takes Toviaz 4mg  daily. PMH: hyperlipidemia, scoliosis, sleep disorder    Patient Stated Goals Pt would like to be able to walk even if its with assistance. She also wants to be able to type and improve her ability to do ADLs to allow for more independence.    Currently in Pain?  Yes    Pain Score 4     Pain Location Arm    Pain Orientation Right;Left    Pain Descriptors / Indicators Aching;Sharp    Pain Type Neuropathic pain    Pain Onset More than a month ago    Pain Frequency Constant                               [facility] Adult PT Treatment/Exercise - 06/30/21 1104       Bed Mobility   Bed Mobility Rolling Right;Rolling Left;Sit to Supine    Rolling Right Supervision/verbal cueing   with use of leg loop on LLE to fully flex leg prior. Used arms for momentum to roll.   Rolling Left Set up assist   PT placedd RLE in flexion prior to rolling and then pt used arms for momentum to roll   Sit to Supine Moderate Assistance - Patient 50-74%   only at RLE to get on mat. Pt used leg loop on LLE and leaned back with momentum to get LLE on to mat.     Transfers   Transfers Lateral/Scoot Transfers    Lateral/Scoot Transfers  5: Supervision;4: Min assist    Lateral/Scoot Transfer Details (indicate cue type and reason) Pt performed lateral scoot transfer supervision other than to assist RLE on to leg rest with return. Multiple scoots to get to/from mat.      Therapeutic Activites    Therapeutic Activities Other Therapeutic Activities    Other Therapeutic Activities Worked on "C" walk to long sit x 2 from right sidelying. Pt utilized leg loop on LLE to pull on to get some leverage to unweight right arm to get elbow under her. Once she found the right balance point, she was able to perform CGA and then walked up on right forearm in "C" position. Then straightened LLE and held to it to rock and assist to come up to long sit CGA. Performed on left side as well. Needed mod assist to get on to left elbow and then min assist to come up with rocking. Broke down to coming down on left forearm and rocking up x 3 more times min assist. Cues to try to ER at left shoulder to lock out arm more to help her triceps which did help. Long sitting scooting towards edge of mat  with using rotation and head movement with pushing with arms min assist to scoot.      Neuro Re-ed    Neuro Re-ed Details  Long sit: arms held out in front performed leaning back as far as could keep balance and them returned upright x 10.      Exercises   Exercises Other Exercises    Other Exercises  Self hamstring stretching in long sit between activities.                       PT Short Term Goals - 06/17/21 1356       PT SHORT TERM GOAL #1   Title Pt will consistently use standing frame as part of HEP, 2-3x/week to facilitate LE WB and trunk control    Time 4    Period Weeks    Status Revised    Target Date 07/12/21      PT SHORT TERM GOAL #2   Title Pt will be able to lateral scoot transfer without slideboard on level surfaces mod I except for assist to reposition feet.    Baseline can perform slideboard MOD I; has begun to perform scooting transfers without slideboard and level and uneven surfaces    Time 4    Period Weeks    Status Revised    Target Date 07/12/21      PT SHORT TERM GOAL #3   Title Pt will be able to roll right consistently with help of left leg loop mod I.    Baseline Intermittent supervision/verbal cues; can perform MOD I 90% of the time    Time 4    Period Weeks    Status Revised    Target Date 07/12/21      PT SHORT TERM GOAL #4   Title Pt will transfer supine to long sit utilizing "C" walk technique to L and R with leg loop and min A consistently    Baseline mod-max A to L; min-mod A to R with leg loop    Time 4    Period Weeks    Status Revised    Target Date 07/12/21      PT SHORT TERM GOAL #5   Title Pt will be able to propel manual w/c 250' supervision for improved mobility and  aerobic conditioning/strengthening.    Baseline has not been using consistently due to holidays/travel    Time 4    Period Weeks    Status Revised    Target Date 07/12/21      PT SHORT TERM GOAL #6   Title Pt will consistently perform sit >  supine on flat mat with leg loop and supervision; will be able to transition long sit > short sit MOD I    Time 4    Period Weeks    Status Revised    Target Date 07/12/21               PT Long Term Goals - 05/14/21 1424       PT LONG TERM GOAL #1   Title Pt will be able to perform short sit to supine transfer utilizing leg loop CGA for improved mobility.    Baseline currently able to get left leg up with loop but max assist for right.    Time 12    Period Weeks    Status New    Target Date 08/07/21      PT LONG TERM GOAL #2   Title Pt will be able to perform lateral scoot transfer without slideboard CGA for improved mobility.    Baseline 05/14/21- Pt still uses slide board and requires PT assist to reposition feet.    Time 12    Period Weeks    Status New    Target Date 08/07/21      PT LONG TERM GOAL #3   Title Pt will be able to stand at counter x 2 min mod assist with PT blocking right leg for improved standing ability/ strength.    Baseline 05/14/21- pt uses standing frame at this time    Time 12    Period Weeks    Status Revised    Target Date 08/07/21      PT LONG TERM GOAL #4   Title Pt will be able to maintain long sit without UE support x 5 min for improved core stability and assist with ADLs.    Time 12    Period Weeks    Status New    Target Date 08/07/21      PT LONG TERM GOAL #5   Title Pt will report being able to perform 20 minutes of manual w/c propulsion around home for improved UE strength and mobility.    Baseline 05/14/21- pt has manual w/c but has yet to use it    Time 12    Period Weeks    Status On-going    Target Date 08/07/21      Additional Long Term Goals   Additional Long Term Goals Yes      PT LONG TERM GOAL #6   Title Pt will be able to perform squat/pivot transfer mod assist of caregiver for improved mobility to uneven surfaces.    Baseline unable    Time 12    Period Weeks    Status New    Target Date 08/07/21                    Plan - 06/30/21 1940     Clinical Impression Statement Pt required less assistance with "C" walking sidelying to sit on right today. She was able to [facility] using leg loop to get right elbow under her CGA today.    Personal Factors and Comorbidities Comorbidity 2    Comorbidities scoliosis and sleep disorder  Examination-Activity Limitations Bed Mobility;Locomotion Level;Transfers;Stand;Bathing;Dressing    Examination-Participation Freight forwarder;Yard Work    [employer] officer Evolving/Moderate complexity    Rehab Potential Good    PT Frequency 3x / week   plus eval   PT Duration 12 weeks    PT Treatment/Interventions ADLs/Self Care Home Management;Electrical Stimulation;DME Instruction;Neuromuscular re-education;Manual techniques;Therapeutic exercise;Balance training;Therapeutic activities;Cryotherapy;Moist Heat;Functional mobility training;Stair training;Gait training;Patient/family education;Orthotic Fit/Training;Wheelchair mobility training;Dry needling;Passive range of motion;Vestibular    PT Next Visit Plan Progress note next visit. Issue pictures for ER stretching in supine and hooklying as well as RLE hip flex/ext on physioball and bridge. I have updated HEP so just need to print out those. Next visit focus more on functional mobility.. Trying to cycle treatments-arms, functional mobility, leg strengthening.  Continue slideboard manual w/c <> mat or powerchair and parts management on manual w/c.  Lateral transfer without slideboard powerchair to/from mat for level or downhill, bed mobility. SciFit if has manual chair. Does need supervision to be sure does not tip. Continue to work on functional strengthening for UE, prone position for scapular strengthening-try unweighting with sheet under chest to be able to walk side to side on forearms. Continue work on long sit with moving legs on mat, balance with less UE support. Tricep  dips using yoga blocks to try to get more lift. Utilizing "C" walk to come from sidelying to long sit.  At some point may try bioness to try to get more quad activation with exercises and possibly with standing frame.    Consulted and Agree with Plan of Care Patient;Family member/caregiver    Family Member Consulted Caregiver - [name]             Patient will benefit from skilled therapeutic intervention in order to improve the following deficits and impairments:  Decreased balance, Decreased mobility, Decreased strength, Impaired sensation, Postural dysfunction, Impaired flexibility, Impaired UE functional use, Impaired tone, Decreased range of motion  Visit Diagnosis: Quadriplegia, C5-C7 incomplete ([place])  Muscle weakness (generalized)     Problem List Patient Active Problem List   Diagnosis Date Noted   Quadriplegia, C5-C7 incomplete ([place]) 01/16/2021   History of spinal fracture 01/16/2021   Suprapubic catheter ([place]) 01/16/2021   Encounter for routine gynecological examination 09/28/2013   Onychomycosis 09/28/2013   Foot deformity, acquired 03/26/2012   Encounter for preventive health examination 12/25/2010   ROSACEA 08/25/2009   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 [place], [place], [address] Phone: [phone]   Fax:  [phone]  Name: [name] MRN: [id number] Date of Birth: 08/23/1950

## 2021-06-30 NOTE — Therapy (Signed)
[name] [address] [place], [place], [address] Phone: [phone]   Fax:  [phone]  Occupational Therapy Treatment  Patient Details  Name: [name] MRN: [id number] Date of Birth: 1951-01-31 Referring Provider (OT): Ina Homes, MD   Encounter Date: 06/30/2021   OT End of Session - 06/30/21 1020     Visit Number 28    Number of Visits 48   +24 visits at renewal 06/16/21   Date for OT Re-Evaluation 09/08/21   add at renewal   Authorization Type [employer]    Authorization Time Period Auth Req'd - 25 visits 05/06/21 - 07/19/21    Authorization - Visit Number 49    Authorization - Number of Visits 41   16+25   Progress Note Due on Visit 34    OT Start Time 1017    OT Stop Time 1100    OT Time Calculation (min) 43 min    Activity Tolerance Patient tolerated treatment well    Behavior During Therapy [facility] for tasks assessed/performed             Past Medical History:  Diagnosis Date   CERVICAL POLYP 03/11/2008   Qualifier: Diagnosis of  By: Regis Bill MD, Standley Brooking    Colon polyps 2005   on colonscopy Dr. Fuller Plan   Fibroid 2004   Per Dr. Ouida Sills   History of shingles    face and mouth   Hx of skin cancer, basal cell    Rosacea    Sciatica of left side 09/28/2013   Scoliosis    noted on mri done for back pain    Past Surgical History:  Procedure Laterality Date   BUNIONECTOMY      There were no vitals filed for this visit.   Subjective Assessment - 06/30/21 1019     Subjective  [name] got me a lovely card for my birthday    Patient is accompanied by: --   caregiver   Pertinent History hyperlipidemia, scoliosis, sleep disorder    Patient Stated Goals regain arm strength for rolling over in bed and fine motor coordination for increasing typing    Currently in Pain? Yes    Pain Score 4     Pain Location Arm    Pain Orientation Right;Left    Pain Descriptors / Indicators  Aching;Sharp    Pain Type Neuropathic pain    Pain Onset More than a month ago    Pain Frequency Constant                          OT Treatments/Exercises (OP) - 06/30/21 1022       Exercises   Exercises Wrist;Elbow      Elbow Exercises   Theraband Level (Elbow Extension) Level 1 (Yellow)      Wrist Exercises   Other wrist exercises x 10 reps flexion, extension, supination, pronation BUE, + yellow theraband for second set      Hand Exercises   Other Hand Exercises place and hold for long finger of RUE in flexed position      Fine Motor Coordination (Hand/Wrist)   Fine Motor Coordination Flipping cards    Manipulation of small objects working on finger extension in LUE with flicking cards and bouncy balls with LUE with minimal force but increased skill    Flipping cards flipping cards with RUE with min difficulty but increased ability with coordination. Worked on manipulation of cards  with BUE in various manners (rotating, dealing, fanning out in hand, etc)                      OT Short Term Goals - 06/16/21 1559       OT SHORT TERM GOAL #1   Title Pt will be independent with HEP w CG assistance PRN    Time 4    Period Weeks    Status Achieved    Target Date 04/13/21      OT SHORT TERM GOAL #2   Title Pt will verbalize understanding and report independence with CG assistance with ues of modalities at home with good safety.    Baseline has paraffin and Estim unit    Time 4    Period Weeks    Status Achieved   pt has new caregiver she is training for paraffin - reviewed and demonstrated understanding of estim unit     OT SHORT TERM GOAL #3   Title Pt will increase Box and Blocks score with RUE to 30 blocks or greater    Baseline R 26 L 39    Time 4    Period Weeks    Status Deferred   R 22 blocks  04/16/21, 5 blocks RUE 05/21/21 d/t finger cramping, 15 blocks 06/16/21 - deferred to updated LTG     OT SHORT TERM GOAL #4   Title Pt will  increase BUE tricep strength to 4/5 consistently for increasing ability to perform SB transfers with supervision/set up assistance only.    Baseline 3+/5 strength BUE    Time 4    Period Weeks    Status Achieved      OT SHORT TERM GOAL #5   Title Pt will increase coordination in LUE to completing 9 hole peg test in 70 seconds or less.    Baseline L 75.13s, R unable    Time 4    Period Weeks    Status Achieved   L 63.87s 04/16/21     OT SHORT TERM GOAL #6   Title Pt will verbalize understanding of adapted strategies and/or equipment for increasing indepenence with ADLs and IADLs (typing, bathing, cutting food, etc)    Baseline has U cuff    Time 4    Period Weeks    Status Achieved   has verbalized understanding of a lot of AE but continues to benefit from education              OT Long Term Goals - 06/16/21 1438       OT LONG TERM GOAL #1   Title Pt will be independence with any updated HEP    Time 12    Period Weeks    Status On-going    Target Date 09/08/21      OT LONG TERM GOAL #2   Title Pt will increase functional use of BUE evidenced by completing Box and Blocks with score of 30 or greater with RUE, 45 or greater with LUE.    Baseline R 26, L 39    Time 12    Period Weeks    Status Revised   15 blocks 06/16/21   Target Date 09/08/21      OT LONG TERM GOAL #3   Title PT will improve grip strength in BUE by increasing grip strength to 10 lbs or greater with RUE and 20 lbs or greater with LUE.    Baseline R 1.5, L 12.3  Time 12    Period Weeks    Status On-going   RUE 3.3 lbs, LUE 14.5 lbs     OT LONG TERM GOAL #4   Title Pt will improve isolated finger movements in order to increase skill towards simple typing with adapted strategies and equipment PRN.    Baseline isolated movement in LUE    Time 12    Period Weeks    Status On-going      OT LONG TERM GOAL #5   Title Pt will improve 9 hole peg test in LUE to completing in 65 seconds or less in order to  increase functional use and demonstrate ability to place 2 or more pegs with RUE.    Baseline L 75.13s, R unable    Time 12    Period Weeks    Status Partially Met   71.19s LUE 05/21/21, was able to place 2 pegs with RUE 05/21/21     OT LONG TERM GOAL #6   Title Pt will report completing UB and LB dressing with decreased assistance consistently.    Baseline UB (reports able to do but not consistently doing), LB total A    Time 12    Period Weeks    Status On-going                   Plan - 06/30/21 1124     Clinical Impression Statement Pt continues to progress towards goals and increasing BUE functioanl use.    OT Occupational Profile and History Detailed Assessment- Review of Records and additional review of physical, cognitive, psychosocial history related to current functional performance    Occupational performance deficits (Please refer to evaluation for details): ADL's;IADL's;Leisure;Work;Rest and Sleep    Body Structure / Function / Physical Skills ADL;IADL;ROM;Strength;Decreased knowledge of use of DME;Dexterity;GMC;Pain;Tone;UE functional use;Body mechanics;Balance;Continence;FMC;Muscle spasms;Skin integrity;Flexibility;Mobility;Sensation;Improper spinal/pelvic alignment;Endurance    Rehab Potential Good    Clinical Decision Making Several treatment options, min-mod task modification necessary    Comorbidities Affecting Occupational Performance: May have comorbidities impacting occupational performance    Modification or Assistance to Complete Evaluation  Min-Moderate modification of tasks or assist with assess necessary to complete eval    OT Frequency 2x / week    OT Duration 12 weeks   @ renewal 06/16/21   OT Treatment/Interventions Self-care/ADL training;Moist Heat;Fluidtherapy;DME and/or AE instruction;Splinting;Therapeutic activities;Aquatic Therapy;Ultrasound;Therapeutic exercise;Cognitive remediation/compensation;Passive range of motion;Functional Mobility  Training;Neuromuscular education;Electrical Stimulation;Paraffin;Manual Therapy;Patient/family education    Plan LB dressing techniques, bed mobility, coordination BUE, fluido from edge of mat?, hip external rotation ROM    Consulted and Agree with Plan of Care Patient;Family member/caregiver    Family Member Consulted spouse [name]             Patient will benefit from skilled therapeutic intervention in order to improve the following deficits and impairments:   Body Structure / Function / Physical Skills: ADL, IADL, ROM, Strength, Decreased knowledge of use of DME, Dexterity, GMC, Pain, Tone, UE functional use, Body mechanics, Balance, Continence, FMC, Muscle spasms, Skin integrity, Flexibility, Mobility, Sensation, Improper spinal/pelvic alignment, Endurance       Visit Diagnosis: Muscle weakness (generalized)  Quadriplegia, C5-C7 incomplete ([facility])  Other abnormalities of gait and mobility  Other lack of coordination  Other symptoms and signs involving the nervous system  Other disturbances of skin sensation    Problem List Patient Active Problem List   Diagnosis Date Noted   Quadriplegia, C5-C7 incomplete ([place]) 01/16/2021   History of spinal fracture 01/16/2021  Suprapubic catheter ([place]) 01/16/2021   Encounter for routine gynecological examination 09/28/2013   Onychomycosis 09/28/2013   Foot deformity, acquired 03/26/2012   Encounter for preventive health examination 12/25/2010   ROSACEA 08/25/2009   Disturbance in sleep behavior 03/11/2008   SKIN CANCER, HX OF 03/11/2008   DYSURIA, HX OF 03/11/2008   Hyperlipidemia 02/10/2007   CERVICALGIA 02/10/2007    Zachery Conch, OT 06/30/2021, 11:26 AM  [facility] [address] [place], [place], [address] Phone: [phone]   Fax:  [phone]  Name: [name] MRN: [id number] Date of Birth: 25-Jun-1950

## 2021-07-02 ENCOUNTER — Ambulatory Visit: Payer: Medicare PPO | Admitting: Occupational Therapy

## 2021-07-02 ENCOUNTER — Other Ambulatory Visit: Payer: Self-pay

## 2021-07-02 ENCOUNTER — Encounter: Payer: Self-pay | Admitting: Occupational Therapy

## 2021-07-02 ENCOUNTER — Ambulatory Visit: Payer: Medicare PPO

## 2021-07-02 DIAGNOSIS — R293 Abnormal posture: Secondary | ICD-10-CM

## 2021-07-02 DIAGNOSIS — R208 Other disturbances of skin sensation: Secondary | ICD-10-CM

## 2021-07-02 DIAGNOSIS — R278 Other lack of coordination: Secondary | ICD-10-CM

## 2021-07-02 DIAGNOSIS — M6281 Muscle weakness (generalized): Secondary | ICD-10-CM

## 2021-07-02 DIAGNOSIS — G8254 Quadriplegia, C5-C7 incomplete: Secondary | ICD-10-CM

## 2021-07-02 DIAGNOSIS — R29818 Other symptoms and signs involving the nervous system: Secondary | ICD-10-CM

## 2021-07-02 DIAGNOSIS — R2689 Other abnormalities of gait and mobility: Secondary | ICD-10-CM | POA: Diagnosis not present

## 2021-07-02 NOTE — Therapy (Signed)
[facility] [address] [place], [place], [address] Phone: [phone]   Fax:  [phone]  Occupational Therapy Treatment  Patient Details  Name: [name] MRN: [id number] Date of Birth: 02-Aug-1950 Referring Provider (OT): Ina Homes, MD   Encounter Date: 07/02/2021   OT End of Session - 07/02/21 1021     Visit Number 29    Number of Visits 48   +24 visits at renewal 06/16/21   Date for OT Re-Evaluation 09/08/21   add at renewal   Authorization Type [employer]    Authorization Time Period Auth Req'd - 25 visits 05/06/21 - 07/19/21    Authorization - Visit Number 42    Authorization - Number of Visits 41   16+25   Progress Note Due on Visit 65    OT Start Time 1018    OT Stop Time 1100    OT Time Calculation (min) 42 min    Activity Tolerance Patient tolerated treatment well    Behavior During Therapy [facility] for tasks assessed/performed             Past Medical History:  Diagnosis Date   CERVICAL POLYP 03/11/2008   Qualifier: Diagnosis of  By: Regis Bill MD, Standley Brooking    Colon polyps 2005   on colonscopy Dr. Fuller Plan   Fibroid 2004   Per Dr. Ouida Sills   History of shingles    face and mouth   Hx of skin cancer, basal cell    Rosacea    Sciatica of left side 09/28/2013   Scoliosis    noted on mri done for back pain    Past Surgical History:  Procedure Laterality Date   BUNIONECTOMY      There were no vitals filed for this visit.   Subjective Assessment - 07/02/21 1021     Subjective  "pain is there like always"    Patient is accompanied by: --   caregiver   Pertinent History hyperlipidemia, scoliosis, sleep disorder    Patient Stated Goals regain arm strength for rolling over in bed and fine motor coordination for increasing typing    Currently in Pain? Yes    Pain Score 4     Pain Location Arm    Pain Orientation Right;Left    Pain Descriptors / Indicators Aching;Sharp     Pain Onset More than a month ago    Pain Frequency Constant             ADLs pt reports getting rocker knife and able to cut up her food with no difficulty last night. Pt also reports able to complete UB dressing and bathing with ecxeption of lower back and LB. Pt with improved independence with ADLs.  Resistance Clothespins 1-8# - Pt used RUE for placing and removing yellow clothespins. Pt completed remainder with LUE with min difficulty for higher resistance clothespins (blue/black)  In Hand manipulation with 1 inch block for rotating in LUE to numbers in sequence. Pt unable to complete in fingertips and with mod difficulty in palm with rotating with thumb.                     OT Short Term Goals - 06/16/21 1559       OT SHORT TERM GOAL #1   Title Pt will be independent with HEP w CG assistance PRN    Time 4    Period Weeks    Status Achieved    Target Date  04/13/21      OT SHORT TERM GOAL #2   Title Pt will verbalize understanding and report independence with CG assistance with ues of modalities at home with good safety.    Baseline has paraffin and Estim unit    Time 4    Period Weeks    Status Achieved   pt has new caregiver she is training for paraffin - reviewed and demonstrated understanding of estim unit     OT SHORT TERM GOAL #3   Title Pt will increase Box and Blocks score with RUE to 30 blocks or greater    Baseline R 26 L 39    Time 4    Period Weeks    Status Deferred   R 22 blocks  04/16/21, 5 blocks RUE 05/21/21 d/t finger cramping, 15 blocks 06/16/21 - deferred to updated LTG     OT SHORT TERM GOAL #4   Title Pt will increase BUE tricep strength to 4/5 consistently for increasing ability to perform SB transfers with supervision/set up assistance only.    Baseline 3+/5 strength BUE    Time 4    Period Weeks    Status Achieved      OT SHORT TERM GOAL #5   Title Pt will increase coordination in LUE to completing 9 hole peg test in 70 seconds  or less.    Baseline L 75.13s, R unable    Time 4    Period Weeks    Status Achieved   L 63.87s 04/16/21     OT SHORT TERM GOAL #6   Title Pt will verbalize understanding of adapted strategies and/or equipment for increasing indepenence with ADLs and IADLs (typing, bathing, cutting food, etc)    Baseline has U cuff    Time 4    Period Weeks    Status Achieved   has verbalized understanding of a lot of AE but continues to benefit from education              OT Long Term Goals - 06/16/21 1438       OT LONG TERM GOAL #1   Title Pt will be independence with any updated HEP    Time 12    Period Weeks    Status On-going    Target Date 09/08/21      OT LONG TERM GOAL #2   Title Pt will increase functional use of BUE evidenced by completing Box and Blocks with score of 30 or greater with RUE, 45 or greater with LUE.    Baseline R 26, L 39    Time 12    Period Weeks    Status Revised   15 blocks 06/16/21   Target Date 09/08/21      OT LONG TERM GOAL #3   Title PT will improve grip strength in BUE by increasing grip strength to 10 lbs or greater with RUE and 20 lbs or greater with LUE.    Baseline R 1.5, L 12.3    Time 12    Period Weeks    Status On-going   RUE 3.3 lbs, LUE 14.5 lbs     OT LONG TERM GOAL #4   Title Pt will improve isolated finger movements in order to increase skill towards simple typing with adapted strategies and equipment PRN.    Baseline isolated movement in LUE    Time 12    Period Weeks    Status On-going      OT LONG TERM GOAL #  5   Title Pt will improve 9 hole peg test in LUE to completing in 65 seconds or less in order to increase functional use and demonstrate ability to place 2 or more pegs with RUE.    Baseline L 75.13s, R unable    Time 12    Period Weeks    Status Partially Met   71.19s LUE 05/21/21, was able to place 2 pegs with RUE 05/21/21     OT LONG TERM GOAL #6   Title Pt will report completing UB and LB dressing with decreased  assistance consistently.    Baseline UB (reports able to do but not consistently doing), LB total A    Time 12    Period Weeks    Status On-going                   Plan - 07/02/21 1100     Clinical Impression Statement Pt demonstrated much improvement with cooridnation and sustained pinch - continues to progress.    OT Occupational Profile and History Detailed Assessment- Review of Records and additional review of physical, cognitive, psychosocial history related to current functional performance    Occupational performance deficits (Please refer to evaluation for details): ADL's;IADL's;Leisure;Work;Rest and Sleep    Body Structure / Function / Physical Skills ADL;IADL;ROM;Strength;Decreased knowledge of use of DME;Dexterity;GMC;Pain;Tone;UE functional use;Body mechanics;Balance;Continence;FMC;Muscle spasms;Skin integrity;Flexibility;Mobility;Sensation;Improper spinal/pelvic alignment;Endurance    Rehab Potential Good    Clinical Decision Making Several treatment options, min-mod task modification necessary    Comorbidities Affecting Occupational Performance: May have comorbidities impacting occupational performance    Modification or Assistance to Complete Evaluation  Min-Moderate modification of tasks or assist with assess necessary to complete eval    OT Frequency 2x / week    OT Duration 12 weeks   @ renewal 06/16/21   OT Treatment/Interventions Self-care/ADL training;Moist Heat;Fluidtherapy;DME and/or AE instruction;Splinting;Therapeutic activities;Aquatic Therapy;Ultrasound;Therapeutic exercise;Cognitive remediation/compensation;Passive range of motion;Functional Mobility Training;Neuromuscular education;Electrical Stimulation;Paraffin;Manual Therapy;Patient/family education    Plan LB dressing techniques, bed mobility, coordination BUE, fluido from edge of mat?, hip external rotation ROM    Consulted and Agree with Plan of Care Patient;Family member/caregiver    Family Member  Consulted spouse [name]             Patient will benefit from skilled therapeutic intervention in order to improve the following deficits and impairments:   Body Structure / Function / Physical Skills: ADL, IADL, ROM, Strength, Decreased knowledge of use of DME, Dexterity, GMC, Pain, Tone, UE functional use, Body mechanics, Balance, Continence, FMC, Muscle spasms, Skin integrity, Flexibility, Mobility, Sensation, Improper spinal/pelvic alignment, Endurance       Visit Diagnosis: Muscle weakness (generalized)  Quadriplegia, C5-C7 incomplete ([facility]CC)  Other abnormalities of gait and mobility  Other lack of coordination  Abnormal posture  Other symptoms and signs involving the nervous system  Other disturbances of skin sensation    Problem List Patient Active Problem List   Diagnosis Date Noted   Quadriplegia, C5-C7 incomplete ([place]) 01/16/2021   History of spinal fracture 01/16/2021   Suprapubic catheter ([place]) 01/16/2021   Encounter for routine gynecological examination 09/28/2013   Onychomycosis 09/28/2013   Foot deformity, acquired 03/26/2012   Encounter for preventive health examination 12/25/2010   ROSACEA 08/25/2009   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 [place], [place], [address] Phone: [phone]   Fax:  [phone]  Name: [name] MRN: [id number] Date of Birth: [birth year]

## 2021-07-02 NOTE — Therapy (Signed)
[facility] [address] [place], [place], [address] Phone: [phone]   Fax:  [phone]  Physical Therapy Treatment/Progress note  Patient Details  Name: [name] MRN: [id number] Date of Birth: June 29, 1950 Referring Provider (PT): Landis Gandy    Progress Note  Reporting period 06/01/21 to 07/02/21  See Note below for Objective Data and Assessment of Progress/Goals  Encounter Date: 07/02/2021   PT End of Session - 07/02/21 1108     Visit Number 40    Number of Visits 61    Date for PT Re-Evaluation 08/07/21    Authorization Type [employer]; 36 visits 12/5-2/24/23    Authorization - Visit Number 14    Authorization - Number of Visits 36    Progress Note Due on Visit 51    PT Start Time 1106    PT Stop Time 1147    PT Time Calculation (min) 41 min    Equipment Utilized During Treatment Other (comment)   patient's personal leg loop   Activity Tolerance Patient tolerated treatment well    Behavior During Therapy [facility] for tasks assessed/performed             Past Medical History:  Diagnosis Date   CERVICAL POLYP 03/11/2008   Qualifier: Diagnosis of  By: Regis Bill MD, Standley Brooking    Colon polyps 2005   on colonscopy Dr. Fuller Plan   Fibroid 2004   Per Dr. Ouida Sills   History of shingles    face and mouth   Hx of skin cancer, basal cell    Rosacea    Sciatica of left side 09/28/2013   Scoliosis    noted on mri done for back pain    Past Surgical History:  Procedure Laterality Date   BUNIONECTOMY      There were no vitals filed for this visit.   Subjective Assessment - 07/02/21 1109     Subjective Pt reports she is going to try to get back in to [place] in March. Did 9 minutes on stepper yesterday.    Pertinent History Pt also takes Toviaz 4mg  daily. PMH: hyperlipidemia, scoliosis, sleep disorder    Patient Stated Goals Pt would like to be able to walk even if its with assistance. She  also wants to be able to type and improve her ability to do ADLs to allow for more independence.    Currently in Pain? Yes    Pain Score 4     Pain Location Arm    Pain Orientation Right;Left    Pain Descriptors / Indicators Aching;Sharp    Pain Type Neuropathic pain    Pain Onset More than a month ago                               [facility] PT Treatment/Exercise - 07/02/21 1147       Transfers   Transfers Lateral/Scoot Transfers    Lateral/Scoot Transfers 5: Supervision;4: Min assist    Lateral/Scoot Transfer Details (indicate cue type and reason) Transfer powerchair to mat supervision with pt requiring increased time when had her move feet on own. Assisted slightly with arms to get left foot off foot plates as hard to lift from more flexed position pushing on foot rest. Then scooted over on mat and was able to pull RLE off using arms to help. Slightly downhill on transfer to mat. With return to powerchair going towards right but was cued  to use LLE to help push some as well. Slightly uphill in this transfer and pt needed multiple scoots to get right buttcheck on mat. PT assited to place RLE up on foot plate for this direction min assist.      Therapeutic Activites    Therapeutic Activities Other Therapeutic Activities    Other Therapeutic Activities Scooting along edge of mat with pt trying to initiate more movement in legs. Pt able to move LLE and assisted with arms to move RLE. PT had towel under right foot so he could initiate some adduction. Scooting 3' to right and left x 2. Performed leaning forward and trying to lift bottom slightly off mat to increase BLE weight bearing 5 x 2 with pt getting slight lift at bottom with PT in front for safety.      Exercises   Exercises Other Exercises    Other Exercises  Seated edge of mat: LLE LAQ witih 3# ankle weight today 10 x 3, left hip abd/adduction lifting foot from ground with 3# ankle weight 10 x 3. Pt gets most of  lift from foot from quad as unable to flex hip against gravity in seated position yet. RLE with foam roll under foot: worked on initiating knee extension trying to roll foot out on foam roll with PT performing muscle tapping to try to stimulate contraction 3 x 3 with rest breaks betweeen. PT assisting back in to flexion on right. Towel under right foot: sliding right foot in with adductor initiation and needing PT to slide back out in to abduction. Performed 3 x 3 on right.                       PT Short Term Goals - 07/02/21 1910       PT SHORT TERM GOAL #1   Title Pt will consistently use standing frame as part of HEP, 2-3x/week to facilitate LE WB and trunk control    Time 4    Period Weeks    Status Revised    Target Date 07/12/21      PT SHORT TERM GOAL #2   Title Pt will be able to lateral scoot transfer without slideboard on level surfaces mod I except for assist to reposition feet.    Baseline can perform slideboard MOD I; has begun to perform scooting transfers without slideboard and level and uneven surfaces    Time 4    Period Weeks    Status Revised    Target Date 07/12/21      PT SHORT TERM GOAL #3   Title Pt will be able to roll right consistently with help of left leg loop mod I.    Baseline Intermittent supervision/verbal cues; can perform MOD I 90% of the time    Time 4    Period Weeks    Status Revised    Target Date 07/12/21      PT SHORT TERM GOAL #4   Title Pt will transfer supine to long sit utilizing "C" walk technique to L and R with leg loop and min A consistently    Baseline mod-max A to L; min-mod A to R with leg loop    Time 4    Period Weeks    Status Revised    Target Date 07/12/21      PT SHORT TERM GOAL #5   Title Pt will be able to propel manual w/c 250' supervision for improved mobility and aerobic conditioning/strengthening.  Baseline has not been using consistently due to holidays/travel    Time 4    Period Weeks    Status  Revised    Target Date 07/12/21      PT SHORT TERM GOAL #6   Title Pt will consistently perform sit > supine on flat mat with leg loop and supervision; will be able to transition long sit > short sit MOD I    Time 4    Period Weeks    Status Revised    Target Date 07/12/21               PT Long Term Goals - 05/14/21 1424       PT LONG TERM GOAL #1   Title Pt will be able to perform short sit to supine transfer utilizing leg loop CGA for improved mobility.    Baseline currently able to get left leg up with loop but max assist for right.    Time 12    Period Weeks    Status New    Target Date 08/07/21      PT LONG TERM GOAL #2   Title Pt will be able to perform lateral scoot transfer without slideboard CGA for improved mobility.    Baseline 05/14/21- Pt still uses slide board and requires PT assist to reposition feet.    Time 12    Period Weeks    Status New    Target Date 08/07/21      PT LONG TERM GOAL #3   Title Pt will be able to stand at counter x 2 min mod assist with PT blocking right leg for improved standing ability/ strength.    Baseline 05/14/21- pt uses standing frame at this time    Time 12    Period Weeks    Status Revised    Target Date 08/07/21      PT LONG TERM GOAL #4   Title Pt will be able to maintain long sit without UE support x 5 min for improved core stability and assist with ADLs.    Time 12    Period Weeks    Status New    Target Date 08/07/21      PT LONG TERM GOAL #5   Title Pt will report being able to perform 20 minutes of manual w/c propulsion around home for improved UE strength and mobility.    Baseline 05/14/21- pt has manual w/c but has yet to use it    Time 12    Period Weeks    Status On-going    Target Date 08/07/21      Additional Long Term Goals   Additional Long Term Goals Yes      PT LONG TERM GOAL #6   Title Pt will be able to perform squat/pivot transfer mod assist of caregiver for improved mobility to uneven  surfaces.    Baseline unable    Time 12    Period Weeks    Status New    Target Date 08/07/21                   Plan - 07/02/21 1911     Clinical Impression Statement PT focused more on BLE strengthening today. Pt continues to show improvements in LLE with tolerating increase to 3# for LAQ. She is starting to utilize LLE more to assist with scooting and transfers. Pt only needing min assist to get RLE on foot plate with transfer back to powerchair with lateral  scoot transfer. Doing well with scooting all the way over and back when getting back in seat. Pt continues to benefit from skilled PT to continue to improve her function.    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[employer] officer Evolving/Moderate complexity    Rehab Potential Good    PT Frequency 3x / week   plus eval   PT Duration 12 weeks    PT Treatment/Interventions ADLs/Self Care Home Management;Electrical Stimulation;DME Instruction;Neuromuscular re-education;Manual techniques;Therapeutic exercise;Balance training;Therapeutic activities;Cryotherapy;Moist Heat;Functional mobility training;Stair training;Gait training;Patient/family education;Orthotic Fit/Training;Wheelchair mobility training;Dry needling;Passive range of motion;Vestibular    PT Next Visit Plan Pt may bring manual chair to work on more next visit. Issue pictures for ER stretching in supine and hooklying as well as RLE hip flex/ext on physioball and bridge. I have updated HEP so just need to print out those. Trying to cycle treatments-arms, functional mobility, leg strengthening.  Continue slideboard manual w/c <> mat or powerchair and parts management on manual w/c.  Lateral transfer without  slideboard powerchair to/from mat for level or downhill, bed mobility. Continue to work on functional strengthening for UE, prone position for scapular strengthening-try unweighting with sheet under chest to be able to walk side to side on forearms. Continue work on long sit with moving legs on mat, balance with less UE support. Utilizing "C" walk to come from sidelying to long sit.  At some point may try bioness to try to get more quad activation with exercises and possibly with standing frame.    Consulted and Agree with Plan of Care Patient;Family member/caregiver    Family Member Consulted Caregiver - [name]             Patient will benefit from skilled therapeutic intervention in order to improve the following deficits and impairments:  Decreased balance, Decreased mobility, Decreased strength, Impaired sensation, Postural dysfunction, Impaired flexibility, Impaired UE functional use, Impaired tone, Decreased range of motion  Visit Diagnosis: Quadriplegia, C5-C7 incomplete ([employer])  Muscle weakness (generalized)     Problem List Patient Active Problem List   Diagnosis Date Noted   Quadriplegia, C5-C7 incomplete ([place]) 01/16/2021   History of spinal fracture 01/16/2021   Suprapubic catheter ([place]) 01/16/2021   Encounter for routine gynecological examination 09/28/2013   Onychomycosis 09/28/2013   Foot deformity, acquired 03/26/2012   Encounter for preventive health examination 12/25/2010   ROSACEA 08/25/2009   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

## 2021-07-03 ENCOUNTER — Ambulatory Visit: Payer: Medicare PPO

## 2021-07-03 DIAGNOSIS — G8254 Quadriplegia, C5-C7 incomplete: Secondary | ICD-10-CM | POA: Diagnosis not present

## 2021-07-03 DIAGNOSIS — R29818 Other symptoms and signs involving the nervous system: Secondary | ICD-10-CM | POA: Diagnosis not present

## 2021-07-03 DIAGNOSIS — M6281 Muscle weakness (generalized): Secondary | ICD-10-CM

## 2021-07-03 DIAGNOSIS — R2689 Other abnormalities of gait and mobility: Secondary | ICD-10-CM | POA: Diagnosis not present

## 2021-07-03 DIAGNOSIS — R293 Abnormal posture: Secondary | ICD-10-CM | POA: Diagnosis not present

## 2021-07-03 DIAGNOSIS — R278 Other lack of coordination: Secondary | ICD-10-CM | POA: Diagnosis not present

## 2021-07-03 DIAGNOSIS — R208 Other disturbances of skin sensation: Secondary | ICD-10-CM | POA: Diagnosis not present

## 2021-07-04 NOTE — Therapy (Signed)
[facility] [address] [place], [place], [address] Phone: [phone]   Fax:  [phone]  Physical Therapy Treatment  Patient Details  Name: [name] MRN: [id number] Date of Birth: 06/16/1950 Referring Provider (PT): Landis Gandy   Encounter Date: 07/03/2021   PT End of Session - 07/03/21 1105     Visit Number 41    Number of Visits 74    Date for PT Re-Evaluation 08/07/21    Authorization Type human[employer] medic[employer]; 36 visits 12/5-2/24/23    Authorization - Visit Number 15    Authorization - Number of Visits 36    Progress Note Due on Visit 38    PT Start Time 1103    PT Stop Time 1144    PT Time Calculation (min) 41 min    Equipment Utilized During Treatment Other (comment)   patient's personal leg loop   Activity Tolerance Patient tolerated treatment well    Behavior During Therapy [facility] for tasks assessed/performed             Past Medical History:  Diagnosis Date   CERVICAL POLYP 03/11/2008   Qualifier: Diagnosis of  By: Regis Bill MD, Standley Brooking    Colon polyps 2005   on colonscopy Dr. Fuller Plan   Fibroid 2004   Per Dr. Ouida Sills   History of shingles    face and mouth   Hx of skin cancer, basal cell    Rosacea    Sciatica of left side 09/28/2013   Scoliosis    noted on mri done for back pain    Past Surgical History:  Procedure Laterality Date   BUNIONECTOMY      There were no vitals filed for this visit.   Subjective Assessment - 07/03/21 1105     Subjective Pt reports she is doing well. Brought manual chair to practice today.    Pertinent History Pt also takes Toviaz 4mg  daily. PMH: hyperlipidemia, scoliosis, sleep disorder    Patient Stated Goals Pt would like to be able to walk even if its with assistance. She also wants to be able to type and improve her ability to do ADLs to allow for more independence.    Currently in Pain? Yes    Pain Location Arm    Pain Orientation Right;Left     Pain Descriptors / Indicators Aching;Sharp    Pain Type Neuropathic pain    Pain Onset More than a month ago    Pain Frequency Constant                               [facility] Adult PT Treatment/Exercise - 07/03/21 1106       Transfers   Transfers Lateral/Scoot Transfers    Lateral/Scoot Transfers 4: Min guard;4: Min assist;With Warehouse manager;With armrests removed    Lateral/Scoot Transfer Details (indicate cue type and reason) powerchair to/from manual chair. Needed to use slideboard due to large gap between chairs. Pt needed assist to place slideboard when returning from manual chair as can not lean over well. PT assisted through transfer to reposition feet.      Wheelchair Mobility   Distance 345    Comments Propelled for 4 min 45 sec straight working on increasing shoulder extension and using large arm movements through full range. Instructed to extend wrists slightly with push down to protect fingers especially on right. Pt was wearing her gloves. Also cued to push a litle less  on left due to left arm being stronger. Then worked on turning with weaving in and out of 4 cones x 4 bouts. Then straight bouts of 50' working on large strokes with arms versus small strokes for faster speed and improved form x 6 bouts.                     PT Education - 07/04/21 0839     Education Details w/c management    Person(s) Educated Patient    Methods Explanation    Comprehension Verbalized understanding              PT Short Term Goals - 07/02/21 1910       PT SHORT TERM GOAL #1   Title Pt will consistently use standing frame as part of HEP, 2-3x/week to facilitate LE WB and trunk control    Time 4    Period Weeks    Status Revised    Target Date 07/12/21      PT SHORT TERM GOAL #2   Title Pt will be able to lateral scoot transfer without slideboard on level surfaces mod I except for assist to reposition feet.    Baseline can perform slideboard MOD I;  has begun to perform scooting transfers without slideboard and level and uneven surfaces    Time 4    Period Weeks    Status Revised    Target Date 07/12/21      PT SHORT TERM GOAL #3   Title Pt will be able to roll right consistently with help of left leg loop mod I.    Baseline Intermittent supervision/verbal cues; can perform MOD I 90% of the time    Time 4    Period Weeks    Status Revised    Target Date 07/12/21      PT SHORT TERM GOAL #4   Title Pt will transfer supine to long sit utilizing "C" walk technique to L and R with leg loop and min A consistently    Baseline mod-max A to L; min-mod A to R with leg loop    Time 4    Period Weeks    Status Revised    Target Date 07/12/21      PT SHORT TERM GOAL #5   Title Pt will be able to propel manual w/c 250' supervision for improved mobility and aerobic conditioning/strengthening.    Baseline has not been using consistently due to holidays/travel    Time 4    Period Weeks    Status Revised    Target Date 07/12/21      PT SHORT TERM GOAL #6   Title Pt will consistently perform sit > supine on flat mat with leg loop and supervision; will be able to transition long sit > short sit MOD I    Time 4    Period Weeks    Status Revised    Target Date 07/12/21               PT Long Term Goals - 05/14/21 1424       PT LONG TERM GOAL #1   Title Pt will be able to perform short sit to supine transfer utilizing leg loop CGA for improved mobility.    Baseline currently able to get left leg up with loop but max assist for right.    Time 12    Period Weeks    Status New    Target Date 08/07/21  PT LONG TERM GOAL #2   Title Pt will be able to perform lateral scoot transfer without slideboard CGA for improved mobility.    Baseline 05/14/21- Pt still uses slide board and requires PT assist to reposition feet.    Time 12    Period Weeks    Status New    Target Date 08/07/21      PT LONG TERM GOAL #3   Title Pt will be  able to stand at counter x 2 min mod assist with PT blocking right leg for improved standing ability/ strength.    Baseline 05/14/21- pt uses standing frame at this time    Time 12    Period Weeks    Status Revised    Target Date 08/07/21      PT LONG TERM GOAL #4   Title Pt will be able to maintain long sit without UE support x 5 min for improved core stability and assist with ADLs.    Time 12    Period Weeks    Status New    Target Date 08/07/21      PT LONG TERM GOAL #5   Title Pt will report being able to perform 20 minutes of manual w/c propulsion around home for improved UE strength and mobility.    Baseline 05/14/21- pt has manual w/c but has yet to use it    Time 12    Period Weeks    Status On-going    Target Date 08/07/21      Additional Long Term Goals   Additional Long Term Goals Yes      PT LONG TERM GOAL #6   Title Pt will be able to perform squat/pivot transfer mod assist of caregiver for improved mobility to uneven surfaces.    Baseline unable    Time 12    Period Weeks    Status New    Target Date 08/07/21                   Plan - 07/04/21 0839     Clinical Impression Statement Session focused on w/c mobility. Pt is showing improvement in ability to move through fuller range with her arms and adjusting better when veers to right.    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navy officer Evolving/Moderate complexity    Rehab Potential Good    PT Frequency 3x / week   plus eval   PT Duration 12 weeks    PT Treatment/Interventions ADLs/Self Care Home Management;Electrical Stimulation;DME Instruction;Neuromuscular re-education;Manual techniques;Therapeutic exercise;Balance  training;Therapeutic activities;Cryotherapy;Moist Heat;Functional mobility training;Stair training;Gait training;Patient/family education;Orthotic Fit/Training;Wheelchair mobility training;Dry needling;Passive range of motion;Vestibular    PT Next Visit Plan STG check due next week. Issue pictures for ER stretching in supine and hooklying as well as RLE hip flex/ext on physioball and bridge. I have updated HEP so just need to print out those. Trying to cycle treatments-arms, functional mobility, leg strengthening.  Continue slideboard manual w/c <> mat or powerchair and parts management on manual w/c.  Lateral transfer without slideboard powerchair to/from mat for level or downhill, bed mobility. Continue to work on functional strengthening for UE, prone position for scapular strengthening-try unweighting with sheet under chest to be able to walk side to side on forearms. Continue work on long sit with moving legs on mat, balance with less  UE support. Utilizing "C" walk to come from sidelying to long sit.  At some point may try bioness to try to get more quad activation with exercises and possibly with standing frame.    Consulted and Agree with Plan of Care Patient;Family member/caregiver    Family Member Consulted Caregiver - [name]             Patient will benefit from skilled therapeutic intervention in order to improve the following deficits and impairments:  Decreased balance, Decreased mobility, Decreased strength, Impaired sensation, Postural dysfunction, Impaired flexibility, Impaired UE functional use, Impaired tone, Decreased range of motion  Visit Diagnosis: Muscle weakness (generalized)  Quadriplegia, C5-C7 incomplete ([place])     Problem List Patient Active Problem List   Diagnosis Date Noted   Quadriplegia, C5-C7 incomplete ([place]) 01/16/2021   History of spinal fracture 01/16/2021   Suprapubic catheter ([place]) 01/16/2021   Encounter for routine gynecological examination 09/28/2013    Onychomycosis 09/28/2013   Foot deformity, acquired 03/26/2012   Encounter for preventive health examination 12/25/2010   ROSACEA 08/25/2009   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 [place], [place], [address] Phone: [phone]   Fax:  [phone]  Name: [name] MRN: [id number] Date of Birth: 1950/12/30

## 2021-07-07 ENCOUNTER — Other Ambulatory Visit: Payer: Self-pay

## 2021-07-07 ENCOUNTER — Ambulatory Visit: Payer: Medicare PPO | Admitting: Occupational Therapy

## 2021-07-07 ENCOUNTER — Ambulatory Visit: Payer: Medicare PPO

## 2021-07-07 ENCOUNTER — Encounter: Payer: Self-pay | Admitting: Occupational Therapy

## 2021-07-07 DIAGNOSIS — R278 Other lack of coordination: Secondary | ICD-10-CM | POA: Diagnosis not present

## 2021-07-07 DIAGNOSIS — M6281 Muscle weakness (generalized): Secondary | ICD-10-CM

## 2021-07-07 DIAGNOSIS — R2689 Other abnormalities of gait and mobility: Secondary | ICD-10-CM

## 2021-07-07 DIAGNOSIS — G8254 Quadriplegia, C5-C7 incomplete: Secondary | ICD-10-CM

## 2021-07-07 DIAGNOSIS — R29818 Other symptoms and signs involving the nervous system: Secondary | ICD-10-CM

## 2021-07-07 DIAGNOSIS — R293 Abnormal posture: Secondary | ICD-10-CM | POA: Diagnosis not present

## 2021-07-07 DIAGNOSIS — R208 Other disturbances of skin sensation: Secondary | ICD-10-CM | POA: Diagnosis not present

## 2021-07-07 NOTE — Patient Instructions (Addendum)
Access Code: [email or web address] URL: [email or web address] Date: 07/07/2021 Prepared by: Cherly Anderson  Exercises Supine Hip Flexion - 1 x daily - 7 x weekly - 1 sets - 10 reps Bent Knee Fallouts - 1 x daily - 7 x weekly - 2 sets - 10 reps Supine Quadricep Sets - 1 x daily - 7 x weekly - 2 sets - 10 reps Clamshell (Mirrored) - 1 x daily - 7 x weekly - 2 sets - 10 reps Sidelying Knee Flexion and Extension in Abduction - 1 x daily - 7 x weekly - 1 sets - 10 reps Supine Hamstring Stretch with Caregiver - 3-4 x daily - 7 x weekly - 1 sets - 4 reps - 1 min hold Supine Hip Internal Rotation PROM with Caregiver - 3-4 x daily - 7 x weekly - 1 sets - 4 reps - 1 min hold Supine Hip External Rotation PROM with Caregiver - 3-4 x daily - 7 x weekly - 1 sets - 4 reps - 1 min hold Supine Ankle Dorsiflexion Stretch with Caregiver - 3-4 x daily - 7 x weekly - 1 sets - 4 reps - 1 min hold Scapular Retraction with Resistance - 1 x daily - 3 x weekly - 2 sets - 10 reps Seated Reverse Pull with Wheel Chair - 1 x daily - 3 x weekly - 2 sets Seated Arm Extension with Band - 1 x daily - 3 x weekly - 2 sets - 12 reps Seated Single Arm Shoulder blade forward/back - 1 x daily - 3 x weekly - 2 sets - 12 reps Seated Single Arm Chest Press with Anchored Resistance - 1 x daily - 3 x weekly - 2 sets - 12 reps Hip flexion/extension on Physioball with caregiver assist - 1 x daily - 5 x weekly - 3 sets - 5 reps Bridge over bolster - 1 x daily - 5 x weekly - 3 sets - 5 reps

## 2021-07-07 NOTE — Therapy (Signed)
[facility] [address] [place], [place], [address] Phone: [phone]   Fax:  [phone]  Physical Therapy Treatment  Patient Details  Name: [name] MRN: [id number] Date of Birth: 12/08/1950 Referring Provider (PT): Landis Gandy   Encounter Date: 07/07/2021   PT End of Session - 07/07/21 1106     Visit Number 42    Number of Visits 78    Date for PT Re-Evaluation 08/07/21    Authorization Type human[employer] medicare; 36 visits 12/5-2/24/23    Authorization - Visit Number 16    Authorization - Number of Visits 36    Progress Note Due on Visit 61    PT Start Time 1105    PT Stop Time 1145    PT Time Calculation (min) 40 min    Equipment Utilized During Treatment Other (comment)   patient's personal leg loop   Activity Tolerance Patient tolerated treatment well    Behavior During Therapy [facility] for tasks assessed/performed             Past Medical History:  Diagnosis Date   CERVICAL POLYP 03/11/2008   Qualifier: Diagnosis of  By: Regis Bill MD, Standley Brooking    Colon polyps 2005   on colonscopy Dr. Fuller Plan   Fibroid 2004   Per Dr. Ouida Sills   History of shingles    face and mouth   Hx of skin cancer, basal cell    Rosacea    Sciatica of left side 09/28/2013   Scoliosis    noted on mri done for back pain    Past Surgical History:  Procedure Laterality Date   BUNIONECTOMY      There were no vitals filed for this visit.   Subjective Assessment - 07/07/21 1106     Subjective Pt reports weekend was ok. She had guests on Sunday.    Patient is accompained by: Family member   husband, Darrick Penna   Pertinent History Pt also takes Toviaz 4mg  daily. PMH: hyperlipidemia, scoliosis, sleep disorder    Patient Stated Goals Pt would like to be able to walk even if its with assistance. She also wants to be able to type and improve her ability to do ADLs to allow for more independence.    Currently in Pain? Yes     Pain Score 4     Pain Location Arm    Pain Orientation Right;Left    Pain Descriptors / Indicators Aching;Sharp    Pain Type Neuropathic pain    Pain Onset More than a month ago    Pain Frequency Constant                               [facility] Adult PT Treatment/Exercise - 07/07/21 1107       Transfers   Transfers Lateral/Scoot Transfers      Therapeutic Activites    Therapeutic Activities Other Therapeutic Activities    Other Therapeutic Activities Transfer short sit to long sit: PT assisted to lift legs while pt propped on arms. Pt able to help slide legs over some once up with support under heels to decrease friction. In long sit scooting over on mat using arms and rotation with head min assist of PT at bottom to slide x 3 each direction.      Neuro Re-ed    Neuro Re-ed Details  In long sit: working on balance with holding arms out in front and  leaning posterior as far as can keep control and coming back up x 10. Then long sit with arms in front and throwing them back in to extension to prop x 10. Then performed with throwing back on one side x 10 each side. Propping back on one arm to side and reaching foreward with other arm to tap of stack cones x 4 each side. (Stacked with LUE and just tapped with RUE) Coming down on forearms to side and back up x 3 each side with min assist on left. Had pt switch to trying to reach forward with RUE as far as possible which helped him to come back up from left more.      Exercises   Exercises Other Exercises    Other Exercises  Pt performed self long sit hamstring stretch 1 min x 3.                     PT Education - 07/07/21 1418     Education Details Issued updated HEP pics    Person(s) Educated Patient    Methods Explanation;Handout    Comprehension Verbalized understanding              PT Short Term Goals - 07/02/21 1910       PT SHORT TERM GOAL #1   Title Pt will consistently use standing frame as  part of HEP, 2-3x/week to facilitate LE WB and trunk control    Time 4    Period Weeks    Status Revised    Target Date 07/12/21      PT SHORT TERM GOAL #2   Title Pt will be able to lateral scoot transfer without slideboard on level surfaces mod I except for assist to reposition feet.    Baseline can perform slideboard MOD I; has begun to perform scooting transfers without slideboard and level and uneven surfaces    Time 4    Period Weeks    Status Revised    Target Date 07/12/21      PT SHORT TERM GOAL #3   Title Pt will be able to roll right consistently with help of left leg loop mod I.    Baseline Intermittent supervision/verbal cues; can perform MOD I 90% of the time    Time 4    Period Weeks    Status Revised    Target Date 07/12/21      PT SHORT TERM GOAL #4   Title Pt will transfer supine to long sit utilizing "C" walk technique to L and R with leg loop and min A consistently    Baseline mod-max A to L; min-mod A to R with leg loop    Time 4    Period Weeks    Status Revised    Target Date 07/12/21      PT SHORT TERM GOAL #5   Title Pt will be able to propel manual w/c 250' supervision for improved mobility and aerobic conditioning/strengthening.    Baseline has not been using consistently due to holidays/travel    Time 4    Period Weeks    Status Revised    Target Date 07/12/21      PT SHORT TERM GOAL #6   Title Pt will consistently perform sit > supine on flat mat with leg loop and supervision; will be able to transition long sit > short sit MOD I    Time 4    Period Weeks    Status Revised  Target Date 07/12/21               PT Long Term Goals - 05/14/21 1424       PT LONG TERM GOAL #1   Title Pt will be able to perform short sit to supine transfer utilizing leg loop CGA for improved mobility.    Baseline currently able to get left leg up with loop but max assist for right.    Time 12    Period Weeks    Status New    Target Date 08/07/21       PT LONG TERM GOAL #2   Title Pt will be able to perform lateral scoot transfer without slideboard CGA for improved mobility.    Baseline 05/14/21- Pt still uses slide board and requires PT assist to reposition feet.    Time 12    Period Weeks    Status New    Target Date 08/07/21      PT LONG TERM GOAL #3   Title Pt will be able to stand at counter x 2 min mod assist with PT blocking right leg for improved standing ability/ strength.    Baseline 05/14/21- pt uses standing frame at this time    Time 12    Period Weeks    Status Revised    Target Date 08/07/21      PT LONG TERM GOAL #4   Title Pt will be able to maintain long sit without UE support x 5 min for improved core stability and assist with ADLs.    Time 12    Period Weeks    Status New    Target Date 08/07/21      PT LONG TERM GOAL #5   Title Pt will report being able to perform 20 minutes of manual w/c propulsion around home for improved UE strength and mobility.    Baseline 05/14/21- pt has manual w/c but has yet to use it    Time 12    Period Weeks    Status On-going    Target Date 08/07/21      Additional Long Term Goals   Additional Long Term Goals Yes      PT LONG TERM GOAL #6   Title Pt will be able to perform squat/pivot transfer mod assist of caregiver for improved mobility to uneven surfaces.    Baseline unable    Time 12    Period Weeks    Status New    Target Date 08/07/21                   Plan - 07/07/21 1419     Clinical Impression Statement Pt is showing improving ability to scoot on mat in long sit with less assistance. Focused on sitting balance and functional mobility with coming back up from sit. Reaching forward does help with this.    Personal Factors and Comorbidities Comorbidity 2    Comorbidities scoliosis and sleep disorder    Examination-Activity Limitations Bed Mobility;Locomotion Level;Transfers;Stand;Bathing;Dressing    Examination-Participation Landscape architect;Yard Work    Merchant navy officer Evolving/Moderate complexity    Rehab Potential Good    PT Frequency 3x / week   plus eval   PT Duration 12 weeks    PT Treatment/Interventions ADLs/Self Care Home Management;Electrical Stimulation;DME Instruction;Neuromuscular re-education;Manual techniques;Therapeutic exercise;Balance training;Therapeutic activities;Cryotherapy;Moist Heat;Functional mobility training;Stair training;Gait training;Patient/family education;Orthotic Fit/Training;Wheelchair mobility training;Dry needling;Passive range of motion;Vestibular    PT Next Visit Plan STG check due this week.  Trying to cycle treatments-arms, functional mobility, leg strengthening.  Continue slideboard manual w/c <> mat or powerchair and parts management on manual w/c.  Lateral transfer without slideboard powerchair to/from mat for level or downhill, bed mobility. Continue to work on functional strengthening for UE, prone position for scapular strengthening-try unweighting with sheet under chest to be able to walk side to side on forearms. Continue work on long sit with moving legs on mat, balance with less UE support. Utilizing "C" walk to come from sidelying to long sit.  At some point may try bioness to try to get more quad activation with exercises and possibly with standing frame.    Consulted and Agree with Plan of Care Patient;Family member/caregiver    Family Member Consulted Caregiver - [name]             Patient will benefit from skilled therapeutic intervention in order to improve the following deficits and impairments:  Decreased balance, Decreased mobility, Decreased strength, Impaired sensation, Postural dysfunction, Impaired flexibility, Impaired UE functional use, Impaired tone, Decreased range of motion  Visit Diagnosis: Muscle weakness (generalized)  Quadriplegia, C5-C7 incomplete ([place])     Problem List Patient Active Problem List   Diagnosis  Date Noted   Quadriplegia, C5-C7 incomplete ([place]) 01/16/2021   History of spinal fracture 01/16/2021   Suprapubic catheter ([place]) 01/16/2021   Encounter for routine gynecological examination 09/28/2013   Onychomycosis 09/28/2013   Foot deformity, acquired 03/26/2012   Encounter for preventive health examination 12/25/2010   ROSACEA 08/25/2009   Disturbance in sleep behavior 03/11/2008   SKIN CANCER, HX OF 03/11/2008   DYSURIA, HX OF 03/11/2008   Hyperlipidemia 02/10/2007   CERVICALGIA 02/10/2007    Electa Sniff, PT, DPT, NCS 07/07/2021, 2:21 PM  [facility] [address] [place], [place], [address] Phone: [phone]   Fax:  [phone]  Name: [name] MRN: [id number] Date of Birth: Mar 28, 1951

## 2021-07-07 NOTE — Therapy (Signed)
[facility] [address] [place], [place], [address] Phone: [phone]   Fax:  [phone]  Occupational Therapy Treatment  Patient Details  Name: [name] MRN: [id number] Date of Birth: September 29, 1950 Referring Provider (OT): Ina Homes, MD   Encounter Date: 07/07/2021   OT End of Session - 07/07/21 1024     Visit Number 30    Number of Visits 48   +24 visits at renewal 06/16/21   Date for OT Re-Evaluation 09/08/21   add at renewal   Authorization Type [employer]    Authorization Time Period Auth Req'd - 25 visits 05/06/21 - 07/19/21    Authorization - Visit Number 35    Authorization - Number of Visits 41   16+25   Progress Note Due on Visit 34    OT Start Time 1018    OT Stop Time 1100    OT Time Calculation (min) 42 min    Activity Tolerance Patient tolerated treatment well    Behavior During Therapy [facility] for tasks assessed/performed             Past Medical History:  Diagnosis Date   CERVICAL POLYP 03/11/2008   Qualifier: Diagnosis of  By: Regis Bill MD, Standley Brooking    Colon polyps 2005   on colonscopy Dr. Fuller Plan   Fibroid 2004   Per Dr. Ouida Sills   History of shingles    face and mouth   Hx of skin cancer, basal cell    Rosacea    Sciatica of left side 09/28/2013   Scoliosis    noted on mri done for back pain    Past Surgical History:  Procedure Laterality Date   BUNIONECTOMY      There were no vitals filed for this visit.   Subjective Assessment - 07/07/21 1023     Subjective  " how do you schedule more people than there are places?"    Patient is accompanied by: --   caregiver   Pertinent History hyperlipidemia, scoliosis, sleep disorder    Patient Stated Goals regain arm strength for rolling over in bed and fine motor coordination for increasing typing    Currently in Pain? Yes    Pain Score 4     Pain Location Arm    Pain Orientation Right;Left    Pain Descriptors /  Indicators Aching;Sharp    Pain Type Neuropathic pain    Pain Onset More than a month ago    Pain Frequency Constant                          OT Treatments/Exercises (OP) - 07/07/21 1035       Modalities   Modalities Moist Heat      Moist Heat Therapy   Number Minutes Moist Heat 10 Minutes   prior to working hands for stiffness   Moist Heat Location Hand   BUE     Fine Motor Coordination (Hand/Wrist)   Fine Motor Coordination Manipulation of small objects    Manipulation of small objects large grade screwdriver - did not copy pattern - with use of LUE for placing screws and BUE for screwing into board. 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 Min difficulty.                      OT Short Term Goals - 06/16/21 1559       OT SHORT TERM  GOAL #1   Title Pt will be independent with HEP w CG assistance PRN    Time 4    Period Weeks    Status Achieved    Target Date 04/13/21      OT SHORT TERM GOAL #2   Title Pt will verbalize understanding and report independence with CG assistance with ues of modalities at home with good safety.    Baseline has paraffin and Estim unit    Time 4    Period Weeks    Status Achieved   pt has new caregiver she is training for paraffin - reviewed and demonstrated understanding of estim unit     OT SHORT TERM GOAL #3   Title Pt will increase Box and Blocks score with RUE to 30 blocks or greater    Baseline R 26 L 39    Time 4    Period Weeks    Status Deferred   R 22 blocks  04/16/21, 5 blocks RUE 05/21/21 d/t finger cramping, 15 blocks 06/16/21 - deferred to updated LTG     OT SHORT TERM GOAL #4   Title Pt will increase BUE tricep strength to 4/5 consistently for increasing ability to perform SB transfers with supervision/set up assistance only.    Baseline 3+/5 strength BUE    Time 4    Period Weeks    Status Achieved      OT SHORT TERM GOAL #5   Title Pt will increase coordination in LUE to completing 9  hole peg test in 70 seconds or less.    Baseline L 75.13s, R unable    Time 4    Period Weeks    Status Achieved   L 63.87s 04/16/21     OT SHORT TERM GOAL #6   Title Pt will verbalize understanding of adapted strategies and/or equipment for increasing indepenence with ADLs and IADLs (typing, bathing, cutting food, etc)    Baseline has U cuff    Time 4    Period Weeks    Status Achieved   has verbalized understanding of a lot of AE but continues to benefit from education              OT Long Term Goals - 06/16/21 1438       OT LONG TERM GOAL #1   Title Pt will be independence with any updated HEP    Time 12    Period Weeks    Status On-going    Target Date 09/08/21      OT LONG TERM GOAL #2   Title Pt will increase functional use of BUE evidenced by completing Box and Blocks with score of 30 or greater with RUE, 45 or greater with LUE.    Baseline R 26, L 39    Time 12    Period Weeks    Status Revised   15 blocks 06/16/21   Target Date 09/08/21      OT LONG TERM GOAL #3   Title PT will improve grip strength in BUE by increasing grip strength to 10 lbs or greater with RUE and 20 lbs or greater with LUE.    Baseline R 1.5, L 12.3    Time 12    Period Weeks    Status On-going   RUE 3.3 lbs, LUE 14.5 lbs     OT LONG TERM GOAL #4   Title Pt will improve isolated finger movements in order to increase skill towards simple typing with adapted strategies  and equipment PRN.    Baseline isolated movement in LUE    Time 12    Period Weeks    Status On-going      OT LONG TERM GOAL #5   Title Pt will improve 9 hole peg test in LUE to completing in 65 seconds or less in order to increase functional use and demonstrate ability to place 2 or more pegs with RUE.    Baseline L 75.13s, R unable    Time 12    Period Weeks    Status Partially Met   71.19s LUE 05/21/21, was able to place 2 pegs with RUE 05/21/21     OT LONG TERM GOAL #6   Title Pt will report completing UB and LB  dressing with decreased assistance consistently.    Baseline UB (reports able to do but not consistently doing), LB total A    Time 12    Period Weeks    Status On-going                   Plan - 07/07/21 1247     Clinical Impression Statement Pt with progression with BUE coordination - continuing to progress towards goals.    OT Occupational Profile and History Detailed Assessment- Review of Records and additional review of physical, cognitive, psychosocial history related to current functional performance    Occupational performance deficits (Please refer to evaluation for details): ADL's;IADL's;Leisure;Work;Rest and Sleep    Body Structure / Function / Physical Skills ADL;IADL;ROM;Strength;Decreased knowledge of use of DME;Dexterity;GMC;Pain;Tone;UE functional use;Body mechanics;Balance;Continence;FMC;Muscle spasms;Skin integrity;Flexibility;Mobility;Sensation;Improper spinal/pelvic alignment;Endurance    Rehab Potential Good    Clinical Decision Making Several treatment options, min-mod task modification necessary    Comorbidities Affecting Occupational Performance: May have comorbidities impacting occupational performance    Modification or Assistance to Complete Evaluation  Min-Moderate modification of tasks or assist with assess necessary to complete eval    OT Frequency 2x / week    OT Duration 12 weeks   @ renewal 06/16/21   OT Treatment/Interventions Self-care/ADL training;Moist Heat;Fluidtherapy;DME and/or AE instruction;Splinting;Therapeutic activities;Aquatic Therapy;Ultrasound;Therapeutic exercise;Cognitive remediation/compensation;Passive range of motion;Functional Mobility Training;Neuromuscular education;Electrical Stimulation;Paraffin;Manual Therapy;Patient/family education    Plan LB dressing techniques, bed mobility, coordination BUE, fluido from edge of mat?, hip external rotation ROM    Consulted and Agree with Plan of Care Patient;Family member/caregiver    Family  Member Consulted spouse [name]             Patient will benefit from skilled therapeutic intervention in order to improve the following deficits and impairments:   Body Structure / Function / Physical Skills: ADL, IADL, ROM, Strength, Decreased knowledge of use of DME, Dexterity, GMC, Pain, Tone, UE functional use, Body mechanics, Balance, Continence, FMC, Muscle spasms, Skin integrity, Flexibility, Mobility, Sensation, Improper spinal/pelvic alignment, Endurance       Visit Diagnosis: Muscle weakness (generalized)  Quadriplegia, C5-C7 incomplete (HCC)  Other abnormalities of gait and mobility  Other lack of coordination  Abnormal posture  Other symptoms and signs involving the nervous system  Other disturbances of skin sensation    Problem List Patient Active Problem List   Diagnosis Date Noted   Quadriplegia, C5-C7 incomplete ([place]) 01/16/2021   History of spinal fracture 01/16/2021   Suprapubic catheter ([place]) 01/16/2021   Encounter for routine gynecological examination 09/28/2013   Onychomycosis 09/28/2013   Foot deformity, acquired 03/26/2012   Encounter for preventive health examination 12/25/2010   ROSACEA 08/25/2009   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 [place], [place], [address] Phone: [phone]   Fax:  [phone]  Name: [name] MRN: [id number] Date of Birth: [birth year]

## 2021-07-09 ENCOUNTER — Ambulatory Visit: Payer: Medicare PPO | Admitting: Occupational Therapy

## 2021-07-09 ENCOUNTER — Ambulatory Visit: Payer: Medicare PPO

## 2021-07-09 ENCOUNTER — Other Ambulatory Visit: Payer: Self-pay

## 2021-07-09 ENCOUNTER — Encounter: Payer: Self-pay | Admitting: Occupational Therapy

## 2021-07-09 DIAGNOSIS — R278 Other lack of coordination: Secondary | ICD-10-CM | POA: Diagnosis not present

## 2021-07-09 DIAGNOSIS — R293 Abnormal posture: Secondary | ICD-10-CM

## 2021-07-09 DIAGNOSIS — M6281 Muscle weakness (generalized): Secondary | ICD-10-CM

## 2021-07-09 DIAGNOSIS — G8254 Quadriplegia, C5-C7 incomplete: Secondary | ICD-10-CM

## 2021-07-09 DIAGNOSIS — R2689 Other abnormalities of gait and mobility: Secondary | ICD-10-CM | POA: Diagnosis not present

## 2021-07-09 DIAGNOSIS — R29818 Other symptoms and signs involving the nervous system: Secondary | ICD-10-CM

## 2021-07-09 DIAGNOSIS — R208 Other disturbances of skin sensation: Secondary | ICD-10-CM | POA: Diagnosis not present

## 2021-07-09 NOTE — Therapy (Signed)
[facility] [address] [place], [place], [address] Phone: [phone]   Fax:  [phone]  Occupational Therapy Treatment  Patient Details  Name: [name] MRN: [id number] Date of Birth: [birth year] Referring Provider (OT): Ina Homes, MD   Encounter Date: 07/09/2021   OT End of Session - 07/09/21 1021     Visit Number 31    Number of Visits 48   +24 visits at renewal 06/16/21   Date for OT Re-Evaluation 09/08/21   add at renewal   Authorization Type [facility] Medicare    Authorization Time Period Auth Req'd - 25 visits 05/06/21 - 07/19/21    Authorization - Visit Number 52    Authorization - Number of Visits 41   16+25   Progress Note Due on Visit 31    OT Start Time 1018    OT Stop Time 1100    OT Time Calculation (min) 42 min    Activity Tolerance Patient tolerated treatment well    Behavior During Therapy [facility] for tasks assessed/performed             Past Medical History:  Diagnosis Date   CERVICAL POLYP 03/11/2008   Qualifier: Diagnosis of  By: Regis Bill MD, Standley Brooking    Colon polyps 2005   on colonscopy Dr. Fuller Plan   Fibroid 2004   Per Dr. Ouida Sills   History of shingles    face and mouth   Hx of skin cancer, basal cell    Rosacea    Sciatica of left side 09/28/2013   Scoliosis    noted on mri done for back pain    Past Surgical History:  Procedure Laterality Date   BUNIONECTOMY      There were no vitals filed for this visit.   Subjective Assessment - 07/09/21 1020     Subjective  "Let me deaccessorize"    Patient is accompanied by: --   caregiver   Pertinent History hyperlipidemia, scoliosis, sleep disorder    Patient Stated Goals regain arm strength for rolling over in bed and fine motor coordination for increasing typing    Currently in Pain? Yes    Pain Score 4     Pain Location Arm    Pain Orientation Left;Right    Pain Descriptors / Indicators Aching;Sharp    Pain  Type Neuropathic pain    Pain Onset More than a month ago    Pain Frequency Constant                          OT Treatments/Exercises (OP) - 07/09/21 1038       Transfers   Comments min A for management of BLE off foot plates and catheter bag with transfer from power chair to edge of mat      Exercises   Exercises Hand      Hand Exercises   Other Hand Exercises composite flexion and extension after Fluido for increasing blood flow and decreasing stiffness with BUE      Neurological Re-education Exercises   Other Exercises 1 w  yellow theraband - seated edge of mat for BUE strengthening with shoulder flexion, bicep curls, tricep extension, scap retraction, horizontal abduction x 10 reps      Modalities   Modalities Fluidotherapy      RUE Fluidotherapy   Number Minutes Fluidotherapy 10 Minutes    RUE Fluidotherapy Location Hand    Comments for stiffness and pain  LUE Fluidotherapy   Number Minutes Fluidotherapy 10 Minutes    LUE Fluidotherapy Location Hand    Comments for stiffness and pain   simultaneously with RUE                     OT Short Term Goals - 06/16/21 1559       OT SHORT TERM GOAL #1   Title Pt will be independent with HEP w CG assistance PRN    Time 4    Period Weeks    Status Achieved    Target Date 04/13/21      OT SHORT TERM GOAL #2   Title Pt will verbalize understanding and report independence with CG assistance with ues of modalities at home with good safety.    Baseline has paraffin and Estim unit    Time 4    Period Weeks    Status Achieved   pt has new caregiver she is training for paraffin - reviewed and demonstrated understanding of estim unit     OT SHORT TERM GOAL #3   Title Pt will increase Box and Blocks score with RUE to 30 blocks or greater    Baseline R 26 L 39    Time 4    Period Weeks    Status Deferred   R 22 blocks  04/16/21, 5 blocks RUE 05/21/21 d/t finger cramping, 15 blocks 06/16/21 -  deferred to updated LTG     OT SHORT TERM GOAL #4   Title Pt will increase BUE tricep strength to 4/5 consistently for increasing ability to perform SB transfers with supervision/set up assistance only.    Baseline 3+/5 strength BUE    Time 4    Period Weeks    Status Achieved      OT SHORT TERM GOAL #5   Title Pt will increase coordination in LUE to completing 9 hole peg test in 70 seconds or less.    Baseline L 75.13s, R unable    Time 4    Period Weeks    Status Achieved   L 63.87s 04/16/21     OT SHORT TERM GOAL #6   Title Pt will verbalize understanding of adapted strategies and/or equipment for increasing indepenence with ADLs and IADLs (typing, bathing, cutting food, etc)    Baseline has U cuff    Time 4    Period Weeks    Status Achieved   has verbalized understanding of a lot of AE but continues to benefit from education              OT Long Term Goals - 06/16/21 1438       OT LONG TERM GOAL #1   Title Pt will be independence with any updated HEP    Time 12    Period Weeks    Status On-going    Target Date 09/08/21      OT LONG TERM GOAL #2   Title Pt will increase functional use of BUE evidenced by completing Box and Blocks with score of 30 or greater with RUE, 45 or greater with LUE.    Baseline R 26, L 39    Time 12    Period Weeks    Status Revised   15 blocks 06/16/21   Target Date 09/08/21      OT LONG TERM GOAL #3   Title PT will improve grip strength in BUE by increasing grip strength to 10 lbs or greater with RUE and 20  lbs or greater with LUE.    Baseline R 1.5, L 12.3    Time 12    Period Weeks    Status On-going   RUE 3.3 lbs, LUE 14.5 lbs     OT LONG TERM GOAL #4   Title Pt will improve isolated finger movements in order to increase skill towards simple typing with adapted strategies and equipment PRN.    Baseline isolated movement in LUE    Time 12    Period Weeks    Status On-going      OT LONG TERM GOAL #5   Title Pt will improve 9  hole peg test in LUE to completing in 65 seconds or less in order to increase functional use and demonstrate ability to place 2 or more pegs with RUE.    Baseline L 75.13s, R unable    Time 12    Period Weeks    Status Partially Met   71.19s LUE 05/21/21, was able to place 2 pegs with RUE 05/21/21     OT LONG TERM GOAL #6   Title Pt will report completing UB and LB dressing with decreased assistance consistently.    Baseline UB (reports able to do but not consistently doing), LB total A    Time 12    Period Weeks    Status On-going                   Plan - 07/09/21 1046     Clinical Impression Statement Pt continues to progress towards goals. Strengthening evident with BUE and continues to be beneficial for increasing independence with transfers and ADLs and IADLs.    OT Occupational Profile and History Detailed Assessment- Review of Records and additional review of physical, cognitive, psychosocial history related to current functional performance    Occupational performance deficits (Please refer to evaluation for details): ADL's;IADL's;Leisure;Work;Rest and Sleep    Body Structure / Function / Physical Skills ADL;IADL;ROM;Strength;Decreased knowledge of use of DME;Dexterity;GMC;Pain;Tone;UE functional use;Body mechanics;Balance;Continence;FMC;Muscle spasms;Skin integrity;Flexibility;Mobility;Sensation;Improper spinal/pelvic alignment;Endurance    Rehab Potential Good    Clinical Decision Making Several treatment options, min-mod task modification necessary    Comorbidities Affecting Occupational Performance: May have comorbidities impacting occupational performance    Modification or Assistance to Complete Evaluation  Min-Moderate modification of tasks or assist with assess necessary to complete eval    OT Frequency 2x / week    OT Duration 12 weeks   @ renewal 06/16/21   OT Treatment/Interventions Self-care/ADL training;Moist Heat;Fluidtherapy;DME and/or AE  instruction;Splinting;Therapeutic activities;Aquatic Therapy;Ultrasound;Therapeutic exercise;Cognitive remediation/compensation;Passive range of motion;Functional Mobility Training;Neuromuscular education;Electrical Stimulation;Paraffin;Manual Therapy;Patient/family education    Plan LB dressing techniques, bed mobility, coordination BUE, fluido from edge of mat, hip external rotation ROM    Consulted and Agree with Plan of Care Patient;Family member/caregiver    Family Member Consulted spouse [name]             Patient will benefit from skilled therapeutic intervention in order to improve the following deficits and impairments:   Body Structure / Function / Physical Skills: ADL, IADL, ROM, Strength, Decreased knowledge of use of DME, Dexterity, GMC, Pain, Tone, UE functional use, Body mechanics, Balance, Continence, FMC, Muscle spasms, Skin integrity, Flexibility, Mobility, Sensation, Improper spinal/pelvic alignment, Endurance       Visit Diagnosis: Muscle weakness (generalized)  Other abnormalities of gait and mobility  Quadriplegia, C5-C7 incomplete (HCC)  Other lack of coordination  Abnormal posture  Other symptoms and signs involving the nervous system  Other disturbances of  skin sensation    Problem List Patient Active Problem List   Diagnosis Date Noted   Quadriplegia, C5-C7 incomplete ([place]) 01/16/2021   History of spinal fracture 01/16/2021   Suprapubic catheter ([place]) 01/16/2021   Encounter for routine gynecological examination 09/28/2013   Onychomycosis 09/28/2013   Foot deformity, acquired 03/26/2012   Encounter for preventive health examination 12/25/2010   ROSACEA 08/25/2009   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 [place], [place], [address] Phone: [phone]   Fax:  [phone]  Name: Milani Lowenstein MRN: [id number] Date of Birth: 05-12-1951

## 2021-07-09 NOTE — Therapy (Addendum)
[facility] [address] [place], [place], [address] Phone: [phone]   Fax:  [phone]  Physical Therapy Treatment  Patient Details  Name: [name] MRN: [id number] Date of Birth: 09-Jul-1950 Referring Provider (PT): Landis Gandy   Encounter Date: 07/09/2021   PT End of Session - 07/09/21 1110     Visit Number 43    Number of Visits 47    Date for PT Re-Evaluation 08/07/21    Authorization Type human[employer] medic[employer]; 36 visits 12/5-2/24/23    Authorization - Visit Number 37    Authorization - Number of Visits 36    Progress Note Due on Visit 52    PT Start Time 1102    PT Stop Time 1145    PT Time Calculation (min) 43 min    Equipment Utilized During Treatment Other (comment)   patient's personal leg loop   Activity Tolerance Patient tolerated treatment well    Behavior During Therapy [facility] for tasks assessed/performed             Past Medical History:  Diagnosis Date   CERVICAL POLYP 03/11/2008   Qualifier: Diagnosis of  By: Regis Bill MD, Standley Brooking    Colon polyps 2005   on colonscopy Dr. Fuller Plan   Fibroid 2004   Per Dr. Ouida Sills   History of shingles    face and mouth   Hx of skin cancer, basal cell    Rosacea    Sciatica of left side 09/28/2013   Scoliosis    noted on mri done for back pain    Past Surgical History:  Procedure Laterality Date   BUNIONECTOMY      There were no vitals filed for this visit.   Subjective Assessment - 07/09/21 1110     Subjective Pt reports that she is still having bad spasms in right calf at night.    Patient is accompained by: Family member   husband, Darrick Penna   Pertinent History Pt also takes Toviaz 4mg  daily. PMH: hyperlipidemia, scoliosis, sleep disorder    Patient Stated Goals Pt would like to be able to walk even if its with assistance. She also wants to be able to type and improve her ability to do ADLs to allow for more independence.    Currently  in Pain? Yes    Pain Score 4     Pain Location Arm    Pain Orientation Right;Left    Pain Descriptors / Indicators Aching;Sharp    Pain Type Neuropathic pain    Pain Onset More than a month ago    Pain Frequency Constant                               [facility] Adult PT Treatment/Exercise - 07/09/21 1114       Transfers   Transfers Lateral/Scoot Transfers    Lateral/Scoot Transfers 5: Supervision;4: Min assist    Lateral/Scoot Transfer Details (indicate cue type and reason) mat to powerchair with min assist to position RLE on foot plate.      Therapeutic Activites    Therapeutic Activities Other Therapeutic Activities    Other Therapeutic Activities Supine with left leg flexed and PT stabilizing at foot to keep in place: pushing with LLE to initiate roll to right 5 x 2 with lifting hip up slightly. Then added in reaching across with left arm with pushing with leg to partially turn to side 5 x  2. Pt able to get more lift at pelvis without having to use arms for momentum. Scooting to edge of mat from long sit moving legs with support under heels to unweight and assisting with arms. At edge of mat worked on leaning forward to bear weight through legs trying to lift bottom slightly CGA with PT in front for safety 5 x 2. Pt able to get some clearance on bottom.      Exercises   Exercises Other Exercises    Other Exercises  Seated edge of mat: left LAQ with 3# weight 10 x 2 with verbal cues to try to control descent. Just short of full range. Supine with legs over bolster bridge 5 x 2 with focus on getting glut activation. Pt not able to fully clear bottom.                     PT Education - 07/09/21 2050     Education Details For cramping in calf at night discussed being sure she is hydrated, stretching before bed, could try some sort of resting DF splint, may consider dry needling in future.    Person(s) Educated Patient;Caregiver(s)    Methods Explanation     Comprehension Verbalized understanding              PT Short Term Goals - 07/02/21 1910       PT SHORT TERM GOAL #1   Title Pt will consistently use standing frame as part of HEP, 2-3x/week to facilitate LE WB and trunk control    Time 4    Period Weeks    Status Revised    Target Date 07/12/21      PT SHORT TERM GOAL #2   Title Pt will be able to lateral scoot transfer without slideboard on level surfaces mod I except for assist to reposition feet.    Baseline can perform slideboard MOD I; has begun to perform scooting transfers without slideboard and level and uneven surfaces    Time 4    Period Weeks    Status Revised    Target Date 07/12/21      PT SHORT TERM GOAL #3   Title Pt will be able to roll right consistently with help of left leg loop mod I.    Baseline Intermittent supervision/verbal cues; can perform MOD I 90% of the time    Time 4    Period Weeks    Status Revised    Target Date 07/12/21      PT SHORT TERM GOAL #4   Title Pt will transfer supine to long sit utilizing "C" walk technique to L and R with leg loop and min A consistently    Baseline mod-max A to L; min-mod A to R with leg loop    Time 4    Period Weeks    Status Revised    Target Date 07/12/21      PT SHORT TERM GOAL #5   Title Pt will be able to propel manual w/c 250' supervision for improved mobility and aerobic conditioning/strengthening.    Baseline has not been using consistently due to holidays/travel    Time 4    Period Weeks    Status Revised    Target Date 07/12/21      PT SHORT TERM GOAL #6   Title Pt will consistently perform sit > supine on flat mat with leg loop and supervision; will be able to transition long sit > short sit  MOD I    Time 4    Period Weeks    Status Revised    Target Date 07/12/21               PT Long Term Goals - 05/14/21 1424       PT LONG TERM GOAL #1   Title Pt will be able to perform short sit to supine transfer utilizing leg loop CGA  for improved mobility.    Baseline currently able to get left leg up with loop but max assist for right.    Time 12    Period Weeks    Status New    Target Date 08/07/21      PT LONG TERM GOAL #2   Title Pt will be able to perform lateral scoot transfer without slideboard CGA for improved mobility.    Baseline 05/14/21- Pt still uses slide board and requires PT assist to reposition feet.    Time 12    Period Weeks    Status New    Target Date 08/07/21      PT LONG TERM GOAL #3   Title Pt will be able to stand at counter x 2 min mod assist with PT blocking right leg for improved standing ability/ strength.    Baseline 05/14/21- pt uses standing frame at this time    Time 12    Period Weeks    Status Revised    Target Date 08/07/21      PT LONG TERM GOAL #4   Title Pt will be able to maintain long sit without UE support x 5 min for improved core stability and assist with ADLs.    Time 12    Period Weeks    Status New    Target Date 08/07/21      PT LONG TERM GOAL #5   Title Pt will report being able to perform 20 minutes of manual w/c propulsion around home for improved UE strength and mobility.    Baseline 05/14/21- pt has manual w/c but has yet to use it    Time 12    Period Weeks    Status On-going    Target Date 08/07/21      Additional Long Term Goals   Additional Long Term Goals Yes      PT LONG TERM GOAL #6   Title Pt will be able to perform squat/pivot transfer mod assist of caregiver for improved mobility to uneven surfaces.    Baseline unable    Time 12    Period Weeks    Status New    Target Date 08/07/21                   Plan - 07/09/21 1859     Clinical Impression Statement Pt was able to initiate more pelvic rotation with assisting with LLE today.    Personal Factors and Comorbidities Comorbidity 2    Comorbidities scoliosis and sleep disorder    Examination-Activity Limitations Bed Mobility;Locomotion Level;Transfers;Stand;Bathing;Dressing     Examination-Participation Freight forwarder;Yard Work    Merchant navy officer Evolving/Moderate complexity    Rehab Potential Good    PT Frequency 3x / week   plus eval   PT Duration 12 weeks    PT Treatment/Interventions ADLs/Self Care Home Management;Electrical Stimulation;DME Instruction;Neuromuscular re-education;Manual techniques;Therapeutic exercise;Balance training;Therapeutic activities;Cryotherapy;Moist Heat;Functional mobility training;Stair training;Gait training;Patient/family education;Orthotic Fit/Training;Wheelchair mobility training;Dry needling;Passive range of motion;Vestibular    PT Next Visit Plan Check STGs.Add dry needling to poc.  Trying to cycle treatments-arms, functional mobility, leg strengthening.  Continue slideboard manual w/c <> mat or powerchair and parts management on manual w/c.  Lateral transfer without slideboard powerchair to/from mat for level or downhill, bed mobility. Continue to work on functional strengthening for UE, prone position for scapular strengthening-try unweighting with sheet under chest to be able to walk side to side on forearms. Continue work on long sit with moving legs on mat, balance with less UE support. Utilizing "C" walk to come from sidelying to long sit.  At some point may try bioness to try to get more quad activation with exercises and possibly with standing frame.    Consulted and Agree with Plan of Care Patient;Family member/caregiver    Family Member Consulted Caregiver - [name]             Patient will benefit from skilled therapeutic intervention in order to improve the following deficits and impairments:  Decreased balance, Decreased mobility, Decreased strength, Impaired sensation, Postural dysfunction, Impaired flexibility, Impaired UE functional use, Impaired tone, Decreased range of motion  Visit Diagnosis: Muscle weakness (generalized)  Quadriplegia, C5-C7 incomplete  ([place])     Problem List Patient Active Problem List   Diagnosis Date Noted   Quadriplegia, C5-C7 incomplete ([place]) 01/16/2021   History of spinal fracture 01/16/2021   Suprapubic catheter ([place]) 01/16/2021   Encounter for routine gynecological examination 09/28/2013   Onychomycosis 09/28/2013   Foot deformity, acquired 03/26/2012   Encounter for preventive health examination 12/25/2010   ROSACEA 08/25/2009   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

## 2021-07-10 ENCOUNTER — Ambulatory Visit: Payer: Medicare PPO

## 2021-07-10 DIAGNOSIS — R208 Other disturbances of skin sensation: Secondary | ICD-10-CM | POA: Diagnosis not present

## 2021-07-10 DIAGNOSIS — R2689 Other abnormalities of gait and mobility: Secondary | ICD-10-CM | POA: Diagnosis not present

## 2021-07-10 DIAGNOSIS — M6281 Muscle weakness (generalized): Secondary | ICD-10-CM | POA: Diagnosis not present

## 2021-07-10 DIAGNOSIS — R293 Abnormal posture: Secondary | ICD-10-CM | POA: Diagnosis not present

## 2021-07-10 DIAGNOSIS — R29818 Other symptoms and signs involving the nervous system: Secondary | ICD-10-CM | POA: Diagnosis not present

## 2021-07-10 DIAGNOSIS — N319 Neuromuscular dysfunction of bladder, unspecified: Secondary | ICD-10-CM | POA: Diagnosis not present

## 2021-07-10 DIAGNOSIS — G8254 Quadriplegia, C5-C7 incomplete: Secondary | ICD-10-CM | POA: Diagnosis not present

## 2021-07-10 DIAGNOSIS — R338 Other retention of urine: Secondary | ICD-10-CM | POA: Diagnosis not present

## 2021-07-10 DIAGNOSIS — R278 Other lack of coordination: Secondary | ICD-10-CM | POA: Diagnosis not present

## 2021-07-11 NOTE — Therapy (Signed)
[name] [address] [place], [place], [address] Phone: [phone]   Fax:  [phone]  Physical Therapy Treatment  Patient Details  Name: [name] MRN: [id number] Date of Birth: August 23, 1950 Referring Provider (PT): Landis Gandy   Encounter Date: 07/10/2021   PT End of Session - 07/10/21 1102     Visit Number 51    Number of Visits 38    Date for PT Re-Evaluation 08/07/21    Authorization Type [employer]; 36 visits 12/5-2/24/23    Authorization - Visit Number 18    Authorization - Number of Visits 36    Progress Note Due on Visit 19    PT Start Time 1100    PT Stop Time 1145    PT Time Calculation (min) 45 min    Equipment Utilized During Treatment Other (comment)   patient's personal leg loop   Activity Tolerance Patient tolerated treatment well    Behavior During Therapy [facility] for tasks assessed/performed             Past Medical History:  Diagnosis Date   CERVICAL POLYP 03/11/2008   Qualifier: Diagnosis of  By: Regis Bill MD, Standley Brooking    Colon polyps 2005   on colonscopy Dr. Fuller Plan   Fibroid 2004   Per Dr. Ouida Sills   History of shingles    face and mouth   Hx of skin cancer, basal cell    Rosacea    Sciatica of left side 09/28/2013   Scoliosis    noted on mri done for back pain    Past Surgical History:  Procedure Laterality Date   BUNIONECTOMY      There were no vitals filed for this visit.   Subjective Assessment - 07/10/21 1102     Subjective Pt reports she did not have spasms again last night.    Patient is accompained by: Family member   husband, [name]   Pertinent History Pt also takes Toviaz 37m daily. PMH: hyperlipidemia, scoliosis, sleep disorder    Patient Stated Goals Pt would like to be able to walk even if its with assistance. She also wants to be able to type and improve her ability to do ADLs to allow for more independence.    Pain Score 4     Pain  Location Arm    Pain Orientation Right;Left    Pain Descriptors / Indicators Aching;Sharp    Pain Type Neuropathic pain    Pain Onset More than a month ago    Pain Frequency Constant                               [facility] Adult PT Treatment/Exercise - 07/10/21 1103       Bed Mobility   Bed Mobility Rolling Right;Rolling Left;Right Sidelying to Sit;Left Sidelying to Sit;Sit to Supine;Sitting - Scoot to Edge of Bed    Rolling Right Supervision/verbal cueing   performed x 2 with using left loop on LLE to assist leg up and arms for momentum.   Rolling Left Set up assist;Supervision/Verbal cueing   took 3 trials to be able to get over after right leg flexed up   Right Sidelying to Sit Minimal Assistance - Patient > 75%   to longsit on mat   Left Sidelying to Sit Moderate Assistance - Patient 50-74%   to long sit on mat   Sitting - Scoot to Edge of Bed Minimal  Assistance - Patient > 75%   to unweight heels to help with sliding to edge of mat. Pt able to scoot one hip up at a time once heels off mat   Sit to Supine Moderate Assistance - Patient 50-74%   only to assist right leg on mat. Pt utilized lep loop on LLE to lift on mat with leaning back     Transfers   Transfers Lateral/Scoot Transfers    Lateral/Scoot Transfers 5: Supervision;4: Medical illustrator Details (indicate cue type and reason) Powerchair to/from mat with assist to reposition RLE and place on foot rest with return. Pt assisted LLE with arms but did need some help to slide foot out some so she could move off leg rest and too much friction when bent under her. Performed 2-3 scoots each direction      Therapeutic Activites    Therapeutic Activities Other Therapeutic Activities    Other Therapeutic Activities Worked on being able to utilize leg loop on RLE to try to get leg up to flexed position in supine. Needed PT to first flex up leg and assist hands in as loop not long enough to reach then had  pt assist back up once holding x 3 reps with both arms pulling and PT straightening back down each time. Able to pull up in this manner. From sidelying worked on "C" walk to long sit on both sides. Utilized leg loop on LLE to unweight some to get elbow under her to start CGA/min asist both sides today. For walking up on right pt was CGA with verbal cues for sequence and when to push LLE straight. Utilized rocking anchoring left hand on left leg to come up on right hand to upright CGA/min assist. For left side "C" walk needed mod assist to come up once she walked up with cues to reach down leg further with RUE. Limited with ability to maintain pull with RUE to help with rock.                     PT Education - 07/10/21 1434     Education Details Results of goal check    Person(s) Educated Patient;Caregiver(s)    Methods Explanation    Comprehension Verbalized understanding              PT Short Term Goals - 07/10/21 1105       PT SHORT TERM GOAL #1   Title Pt will consistently use standing frame as part of HEP, 2-3x/week to facilitate LE WB and trunk control    Baseline 07/10/21 Pt is doing standing frame 3 x/week for an hour. She has also obtained a SciFit and is doing 6 days/wk    Time 4    Period Weeks    Status Achieved    Target Date 07/12/21      PT SHORT TERM GOAL #2   Title Pt will be able to lateral scoot transfer without slideboard on level surfaces mod I except for assist to reposition feet.    Baseline 07/10/21 supervision for lateral scoot transfers without slideboard except to reposition feet    Time 4    Period Weeks    Status Partially Met    Target Date 07/12/21      PT SHORT TERM GOAL #3   Title Pt will be able to roll right consistently with help of left leg loop mod I.    Baseline continues to need Intermittent supervision/verbal  cues; can perform MOD I 90% of the time    Time 4    Period Weeks    Status On-going    Target Date 07/12/21      PT  SHORT TERM GOAL #4   Title Pt will transfer supine to long sit utilizing "C" walk technique to L and R with leg loop and min A consistently    Baseline mod-max A to L; min-mod A to R with leg loop. 07/10/21 "C" walk right with leg loop min assist, left mod assist    Time 4    Period Weeks    Status Partially Met    Target Date 07/12/21      PT SHORT TERM GOAL #5   Title Pt will be able to propel manual w/c 250' supervision for improved mobility and aerobic conditioning/strengthening.    Baseline 07/10/21 Pt has not been using manual chair due to cold outside but will start more when warms up some. When last assessed in clinic performed 345' supervision/CGA on level surfaces    Time 4    Period Weeks    Status Partially Met    Target Date 07/12/21      PT SHORT TERM GOAL #6   Title Pt will consistently perform sit > supine on flat mat with leg loop and supervision; will be able to transition long sit > short sit MOD I    Baseline 07/10/21 sit to supine with leg loop mod assist only to get RLE on to mat, able to get LLE up with leg loop on own. Long sit to short sit min assist to unweight feet to move off mat    Time 4    Period Weeks    Status On-going    Target Date 07/12/21               PT Long Term Goals - 05/14/21 1424       PT LONG TERM GOAL #1   Title Pt will be able to perform short sit to supine transfer utilizing leg loop CGA for improved mobility.    Baseline currently able to get left leg up with loop but max assist for right.    Time 12    Period Weeks    Status New    Target Date 08/07/21      PT LONG TERM GOAL #2   Title Pt will be able to perform lateral scoot transfer without slideboard CGA for improved mobility.    Baseline 05/14/21- Pt still uses slide board and requires PT assist to reposition feet.    Time 12    Period Weeks    Status New    Target Date 08/07/21      PT LONG TERM GOAL #3   Title Pt will be able to stand at counter x 2 min mod assist  with PT blocking right leg for improved standing ability/ strength.    Baseline 05/14/21- pt uses standing frame at this time    Time 12    Period Weeks    Status Revised    Target Date 08/07/21      PT LONG TERM GOAL #4   Title Pt will be able to maintain long sit without UE support x 5 min for improved core stability and assist with ADLs.    Time 12    Period Weeks    Status New    Target Date 08/07/21      PT LONG TERM GOAL #5  Title Pt will report being able to perform 20 minutes of manual w/c propulsion around home for improved UE strength and mobility.    Baseline 05/14/21- pt has manual w/c but has yet to use it    Time 12    Period Weeks    Status On-going    Target Date 08/07/21      Additional Long Term Goals   Additional Long Term Goals Yes      PT LONG TERM GOAL #6   Title Pt will be able to perform squat/pivot transfer mod assist of caregiver for improved mobility to uneven surfaces.    Baseline unable    Time 12    Period Weeks    Status New    Target Date 08/07/21                   Plan - 07/10/21 1442     Clinical Impression Statement PT assessed STGs today. Pt continues to show  improvement in all functional mobility tasks. She is requiring decreased assistance with lateral scoot transfers requiring only supervision without use of slideboard on level transfers with PT only assisting to reposition feet. Pt is able to perform rolling to right more consistently with use of leg loop supervision. She is now min assist with transition from right sidelying to long sit using "C" walk technique with leg loop and mod assist from right sidelying to long sit. Pt has met goal for use of standing frame at home at least 3x/week and is also using new SciFit recumbant stepper she purchased almost daily. Pt will continue to benefit from skilled PT to continue to progress towards goals and maximize function.    Personal Factors and Comorbidities Comorbidity 2     Comorbidities scoliosis and sleep disorder    Examination-Activity Limitations Bed Mobility;Locomotion Level;Transfers;Stand;Bathing;Dressing    Examination-Participation Freight forwarder;Yard Work    Merchant navy officer Evolving/Moderate complexity    Rehab Potential Good    PT Frequency 3x / week   plus eval   PT Duration 12 weeks    PT Treatment/Interventions ADLs/Self Care Home Management;Electrical Stimulation;DME Instruction;Neuromuscular re-education;Manual techniques;Therapeutic exercise;Balance training;Therapeutic activities;Cryotherapy;Moist Heat;Functional mobility training;Stair training;Gait training;Patient/family education;Orthotic Fit/Training;Wheelchair mobility training;Dry needling;Passive range of motion;Vestibular    PT Next Visit Plan Trying to cycle treatments-arms, functional mobility, leg strengthening.  Continue slideboard manual w/c <> mat or powerchair and parts management on manual w/c.  Lateral transfer without slideboard powerchair to/from mat for level or downhill, bed mobility. Continue to work on functional strengthening for UE, prone position for scapular strengthening-try unweighting with sheet under chest to be able to walk side to side on forearms. Continue work on long sit with moving legs on mat, balance with less UE support. Utilizing "C" walk to come from sidelying to long sit.  At some point may try bioness to try to get more quad activation with exercises and possibly with standing frame.    Consulted and Agree with Plan of Care Patient;Family member/caregiver    Family Member Consulted Caregiver - [name]             Patient will benefit from skilled therapeutic intervention in order to improve the following deficits and impairments:  Decreased balance, Decreased mobility, Decreased strength, Impaired sensation, Postural dysfunction, Impaired flexibility, Impaired UE functional use, Impaired tone, Decreased  range of motion  Visit Diagnosis: Muscle weakness (generalized)  Quadriplegia, C5-C7 incomplete [facility])     Problem List Patient Active Problem List   Diagnosis Date  Noted   Quadriplegia, C5-C7 incomplete ([place]) 01/16/2021   History of spinal fracture 01/16/2021   Suprapubic catheter ([place]) 01/16/2021   Encounter for routine gynecological examination 09/28/2013   Onychomycosis 09/28/2013   Foot deformity, acquired 03/26/2012   Encounter for preventive health examination 12/25/2010   ROSACEA 08/25/2009   Disturbance in sleep behavior 03/11/2008   SKIN CANCER, HX OF 03/11/2008   DYSURIA, HX OF 03/11/2008   Hyperlipidemia 02/10/2007   CERVICALGIA 02/10/2007    Electa Sniff, PT, DPT, NCS 07/11/2021, 10:35 AM  [facility] [address] [place], [place], [address] Phone: [phone]   Fax:  [phone]  Name: [name] MRN: [id number] Date of Birth: 05/03/1951

## 2021-07-14 ENCOUNTER — Other Ambulatory Visit: Payer: Self-pay

## 2021-07-14 ENCOUNTER — Ambulatory Visit: Payer: Medicare PPO | Admitting: Occupational Therapy

## 2021-07-14 ENCOUNTER — Encounter: Payer: Self-pay | Admitting: Occupational Therapy

## 2021-07-14 ENCOUNTER — Ambulatory Visit: Payer: Medicare PPO

## 2021-07-14 DIAGNOSIS — R293 Abnormal posture: Secondary | ICD-10-CM

## 2021-07-14 DIAGNOSIS — R29818 Other symptoms and signs involving the nervous system: Secondary | ICD-10-CM | POA: Diagnosis not present

## 2021-07-14 DIAGNOSIS — R208 Other disturbances of skin sensation: Secondary | ICD-10-CM | POA: Diagnosis not present

## 2021-07-14 DIAGNOSIS — M6281 Muscle weakness (generalized): Secondary | ICD-10-CM | POA: Diagnosis not present

## 2021-07-14 DIAGNOSIS — R278 Other lack of coordination: Secondary | ICD-10-CM

## 2021-07-14 DIAGNOSIS — R2689 Other abnormalities of gait and mobility: Secondary | ICD-10-CM

## 2021-07-14 DIAGNOSIS — G8254 Quadriplegia, C5-C7 incomplete: Secondary | ICD-10-CM | POA: Diagnosis not present

## 2021-07-14 NOTE — Therapy (Signed)
[facility] [address] [place], [place], [address] Phone: [phone]   Fax:  [phone]  Occupational Therapy Treatment  Patient Details  Name: [name] MRN: [id number] Date of Birth: 05-Jan-1951 Referring Provider (OT): Ina Homes, MD   Encounter Date: 07/14/2021   OT End of Session - 07/14/21 1019     Visit Number 32    Number of Visits 48   +24 visits at renewal 06/16/21   Date for OT Re-Evaluation 09/08/21   add at renewal   Authorization Type [employer]    Authorization Time Period Auth Req'd - 25 visits 05/06/21 - 07/19/21    Authorization - Visit Number 2    Authorization - Number of Visits 41   16+25   Progress Note Due on Visit 31    OT Start Time 1018    OT Stop Time 1100    OT Time Calculation (min) 42 min    Activity Tolerance Patient tolerated treatment well    Behavior During Therapy [facility] for tasks assessed/performed             Past Medical History:  Diagnosis Date   CERVICAL POLYP 03/11/2008   Qualifier: Diagnosis of  By: Regis Bill MD, Standley Brooking    Colon polyps 2005   on colonscopy Dr. Fuller Plan   Fibroid 2004   Per Dr. Ouida Sills   History of shingles    face and mouth   Hx of skin cancer, basal cell    Rosacea    Sciatica of left side 09/28/2013   Scoliosis    noted on mri done for back pain    Past Surgical History:  Procedure Laterality Date   BUNIONECTOMY      There were no vitals filed for this visit.   Subjective Assessment - 07/14/21 1019     Subjective  "doing alright"    Patient is accompanied by: --   caregiver   Pertinent History hyperlipidemia, scoliosis, sleep disorder    Patient Stated Goals regain arm strength for rolling over in bed and fine motor coordination for increasing typing    Currently in Pain? Yes    Pain Score 4     Pain Location Arm    Pain Orientation Left;Right    Pain Descriptors / Indicators Aching;Sharp    Pain Type  Chronic pain;Neuropathic pain    Pain Onset More than a month ago    Pain Frequency Constant             Hand Gripper: with LUE on level 1 with black spring. Pt picked up 1 inch blocks with gripper with min drops and min difficulty. Pt with fatigue d/t endurance of LUE hand  Medium Pegs with RUE with working on manipulating and rotating peg with RUE instead of placing down and turning or using LUE. Increased time and mod difficulty. Remove with RUE.  Grooved Pegs with LUE with placing in slots with LUE with min difficulty.                       OT Short Term Goals - 06/16/21 1559       OT SHORT TERM GOAL #1   Title Pt will be independent with HEP w CG assistance PRN    Time 4    Period Weeks    Status Achieved    Target Date 04/13/21      OT SHORT TERM GOAL #2   Title Pt will  verbalize understanding and report independence with CG assistance with ues of modalities at home with good safety.    Baseline has paraffin and Estim unit    Time 4    Period Weeks    Status Achieved   pt has new caregiver she is training for paraffin - reviewed and demonstrated understanding of estim unit     OT SHORT TERM GOAL #3   Title Pt will increase Box and Blocks score with RUE to 30 blocks or greater    Baseline R 26 L 39    Time 4    Period Weeks    Status Deferred   R 22 blocks  04/16/21, 5 blocks RUE 05/21/21 d/t finger cramping, 15 blocks 06/16/21 - deferred to updated LTG     OT SHORT TERM GOAL #4   Title Pt will increase BUE tricep strength to 4/5 consistently for increasing ability to perform SB transfers with supervision/set up assistance only.    Baseline 3+/5 strength BUE    Time 4    Period Weeks    Status Achieved      OT SHORT TERM GOAL #5   Title Pt will increase coordination in LUE to completing 9 hole peg test in 70 seconds or less.    Baseline L 75.13s, R unable    Time 4    Period Weeks    Status Achieved   L 63.87s 04/16/21     OT SHORT TERM GOAL  #6   Title Pt will verbalize understanding of adapted strategies and/or equipment for increasing indepenence with ADLs and IADLs (typing, bathing, cutting food, etc)    Baseline has U cuff    Time 4    Period Weeks    Status Achieved   has verbalized understanding of a lot of AE but continues to benefit from education              OT Long Term Goals - 06/16/21 1438       OT LONG TERM GOAL #1   Title Pt will be independence with any updated HEP    Time 12    Period Weeks    Status On-going    Target Date 09/08/21      OT LONG TERM GOAL #2   Title Pt will increase functional use of BUE evidenced by completing Box and Blocks with score of 30 or greater with RUE, 45 or greater with LUE.    Baseline R 26, L 39    Time 12    Period Weeks    Status Revised   15 blocks 06/16/21   Target Date 09/08/21      OT LONG TERM GOAL #3   Title PT will improve grip strength in BUE by increasing grip strength to 10 lbs or greater with RUE and 20 lbs or greater with LUE.    Baseline R 1.5, L 12.3    Time 12    Period Weeks    Status On-going   RUE 3.3 lbs, LUE 14.5 lbs     OT LONG TERM GOAL #4   Title Pt will improve isolated finger movements in order to increase skill towards simple typing with adapted strategies and equipment PRN.    Baseline isolated movement in LUE    Time 12    Period Weeks    Status On-going      OT LONG TERM GOAL #5   Title Pt will improve 9 hole peg test in LUE to completing in  65 seconds or less in order to increase functional use and demonstrate ability to place 2 or more pegs with RUE.    Baseline L 75.13s, R unable    Time 12    Period Weeks    Status Partially Met   71.19s LUE 05/21/21, was able to place 2 pegs with RUE 05/21/21     OT LONG TERM GOAL #6   Title Pt will report completing UB and LB dressing with decreased assistance consistently.    Baseline UB (reports able to do but not consistently doing), LB total A    Time 12    Period Weeks    Status  On-going                   Plan - 07/14/21 1119     Clinical Impression Statement Pt continues to progress well towards goals and increasing strength and coodination in BUE.    OT Occupational Profile and History Detailed Assessment- Review of Records and additional review of physical, cognitive, psychosocial history related to current functional performance    Occupational performance deficits (Please refer to evaluation for details): ADL's;IADL's;Leisure;Work;Rest and Sleep    Body Structure / Function / Physical Skills ADL;IADL;ROM;Strength;Decreased knowledge of use of DME;Dexterity;GMC;Pain;Tone;UE functional use;Body mechanics;Balance;Continence;FMC;Muscle spasms;Skin integrity;Flexibility;Mobility;Sensation;Improper spinal/pelvic alignment;Endurance    Rehab Potential Good    Clinical Decision Making Several treatment options, min-mod task modification necessary    Comorbidities Affecting Occupational Performance: May have comorbidities impacting occupational performance    Modification or Assistance to Complete Evaluation  Min-Moderate modification of tasks or assist with assess necessary to complete eval    OT Frequency 2x / week    OT Duration 12 weeks   @ renewal 06/16/21   OT Treatment/Interventions Self-care/ADL training;Moist Heat;Fluidtherapy;DME and/or AE instruction;Splinting;Therapeutic activities;Aquatic Therapy;Ultrasound;Therapeutic exercise;Cognitive remediation/compensation;Passive range of motion;Functional Mobility Training;Neuromuscular education;Electrical Stimulation;Paraffin;Manual Therapy;Patient/family education    Plan LB dressing techniques, bed mobility, coordination BUE, fluido from edge of mat, hip external rotation ROM    Consulted and Agree with Plan of Care Patient;Family member/caregiver    Family Member Consulted spouse [name]             Patient will benefit from skilled therapeutic intervention in order to improve the following deficits and  impairments:   Body Structure / Function / Physical Skills: ADL, IADL, ROM, Strength, Decreased knowledge of use of DME, Dexterity, GMC, Pain, Tone, UE functional use, Body mechanics, Balance, Continence, FMC, Muscle spasms, Skin integrity, Flexibility, Mobility, Sensation, Improper spinal/pelvic alignment, Endurance       Visit Diagnosis: Muscle weakness (generalized)  Quadriplegia, C5-C7 incomplete (HCC)  Other lack of coordination  Other abnormalities of gait and mobility  Abnormal posture  Other symptoms and signs involving the nervous system  Other disturbances of skin sensation    Problem List Patient Active Problem List   Diagnosis Date Noted   Quadriplegia, C5-C7 incomplete ([place]) 01/16/2021   History of spinal fracture 01/16/2021   Suprapubic catheter ([place]) 01/16/2021   Encounter for routine gynecological examination 09/28/2013   Onychomycosis 09/28/2013   Foot deformity, acquired 03/26/2012   Encounter for preventive health examination 12/25/2010   ROSACEA 08/25/2009   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

## 2021-07-16 ENCOUNTER — Encounter: Payer: Self-pay | Admitting: Occupational Therapy

## 2021-07-16 ENCOUNTER — Other Ambulatory Visit: Payer: Self-pay

## 2021-07-16 ENCOUNTER — Ambulatory Visit: Payer: Medicare PPO | Attending: Internal Medicine | Admitting: Occupational Therapy

## 2021-07-16 ENCOUNTER — Ambulatory Visit: Payer: Medicare PPO

## 2021-07-16 DIAGNOSIS — R2689 Other abnormalities of gait and mobility: Secondary | ICD-10-CM | POA: Diagnosis not present

## 2021-07-16 DIAGNOSIS — M6281 Muscle weakness (generalized): Secondary | ICD-10-CM | POA: Diagnosis not present

## 2021-07-16 DIAGNOSIS — R293 Abnormal posture: Secondary | ICD-10-CM | POA: Insufficient documentation

## 2021-07-16 DIAGNOSIS — R278 Other lack of coordination: Secondary | ICD-10-CM | POA: Diagnosis not present

## 2021-07-16 DIAGNOSIS — N3000 Acute cystitis without hematuria: Secondary | ICD-10-CM | POA: Diagnosis not present

## 2021-07-16 DIAGNOSIS — R208 Other disturbances of skin sensation: Secondary | ICD-10-CM | POA: Diagnosis not present

## 2021-07-16 DIAGNOSIS — R29818 Other symptoms and signs involving the nervous system: Secondary | ICD-10-CM | POA: Diagnosis not present

## 2021-07-16 DIAGNOSIS — G8254 Quadriplegia, C5-C7 incomplete: Secondary | ICD-10-CM

## 2021-07-16 NOTE — Therapy (Signed)
[facility] [address] [place], [place], [address] Phone: [phone]   Fax:  [phone]  Occupational Therapy Treatment  Patient Details  Name: [name] MRN: [id number] Date of Birth: 01/14/1951 Referring Provider (OT): Ina Homes, MD   Encounter Date: 07/16/2021   OT End of Session - 07/16/21 1147     Visit Number 33    Number of Visits 48   +24 visits at renewal 06/16/21   Date for OT Re-Evaluation 09/08/21   add at renewal   Authorization Type [facility] Medicare    Authorization Time Period Auth Req'd - 25 visits 05/06/21 - 07/19/21    Authorization - Visit Number 49    Authorization - Number of Visits 41   16+25   Progress Note Due on Visit 20    OT Start Time 1146    OT Stop Time 1230    OT Time Calculation (min) 44 min    Activity Tolerance Patient tolerated treatment well    Behavior During Therapy [facility] for tasks assessed/performed             Past Medical History:  Diagnosis Date   CERVICAL POLYP 03/11/2008   Qualifier: Diagnosis of  By: Regis Bill MD, Standley Brooking    Colon polyps 2005   on colonscopy Dr. Fuller Plan   Fibroid 2004   Per Dr. Ouida Sills   History of shingles    face and mouth   Hx of skin cancer, basal cell    Rosacea    Sciatica of left side 09/28/2013   Scoliosis    noted on mri done for back pain    Past Surgical History:  Procedure Laterality Date   BUNIONECTOMY      There were no vitals filed for this visit.   Subjective Assessment - 07/16/21 1146     Subjective  Pt concerned about a possible UTI    Patient is accompanied by: --   caregiver   Pertinent History hyperlipidemia, scoliosis, sleep disorder    Patient Stated Goals regain arm strength for rolling over in bed and fine motor coordination for increasing typing    Currently in Pain? Yes    Pain Score 4     Pain Location Arm    Pain Orientation Left;Right    Pain Descriptors / Indicators Aching;Sharp     Pain Type Neuropathic pain    Pain Onset More than a month ago    Pain Frequency Constant                          OT Treatments/Exercises (OP) - 07/16/21 0001       ADLs   Overall ADLs pt transitioned to supine from long sitting withproping on elbows and lowering self with stand by assistance. Pt req'd min- mod  A for management of LLE knee for rolling to right and sitting up on edge of mat to transfer to chair.    LB Dressing worked on [employer] work for progressing towards figure 4 and circle sitting for increasing indpeendence with LB dressing. Pt able to bring LLE to approx calf level but unable to maintain position d/t hip tightness. Pt demonstrates much improved hip external rotation from last time attempting this. Pt motivated and determined to find ways to managing center and body to achieve postions for increasing independence. Pt was able to thread yellow theraband on BLE with min A for management of feet. Encouraged patient ot try  doing little pieces of LB Dressing and seeing how to make it work in bed at home for increasing independence. Pt with good control and body awareness with rocking and with maintaining upright sitting balance while long sitting and circle sitting (req'd management of maintaining feet position)      Neurological Re-education Exercises   Other Exercises 1 Universal Exercise Machine with row x 10 reps with 10 lbs. Pt req'd min A for starting past end range at LUE d/t weakness. Pt was able to complete without UE active hands.                      OT Short Term Goals - 06/16/21 1559       OT SHORT TERM GOAL #1   Title Pt will be independent with HEP w CG assistance PRN    Time 4    Period Weeks    Status Achieved    Target Date 04/13/21      OT SHORT TERM GOAL #2   Title Pt will verbalize understanding and report independence with CG assistance with ues of modalities at home with good safety.    Baseline has paraffin and Estim  unit    Time 4    Period Weeks    Status Achieved   pt has new caregiver she is training for paraffin - reviewed and demonstrated understanding of estim unit     OT SHORT TERM GOAL #3   Title Pt will increase Box and Blocks score with RUE to 30 blocks or greater    Baseline R 26 L 39    Time 4    Period Weeks    Status Deferred   R 22 blocks  04/16/21, 5 blocks RUE 05/21/21 d/t finger cramping, 15 blocks 06/16/21 - deferred to updated LTG     OT SHORT TERM GOAL #4   Title Pt will increase BUE tricep strength to 4/5 consistently for increasing ability to perform SB transfers with supervision/set up assistance only.    Baseline 3+/5 strength BUE    Time 4    Period Weeks    Status Achieved      OT SHORT TERM GOAL #5   Title Pt will increase coordination in LUE to completing 9 hole peg test in 70 seconds or less.    Baseline L 75.13s, R unable    Time 4    Period Weeks    Status Achieved   L 63.87s 04/16/21     OT SHORT TERM GOAL #6   Title Pt will verbalize understanding of adapted strategies and/or equipment for increasing indepenence with ADLs and IADLs (typing, bathing, cutting food, etc)    Baseline has U cuff    Time 4    Period Weeks    Status Achieved   has verbalized understanding of a lot of AE but continues to benefit from education              OT Long Term Goals - 06/16/21 1438       OT LONG TERM GOAL #1   Title Pt will be independence with any updated HEP    Time 12    Period Weeks    Status On-going    Target Date 09/08/21      OT LONG TERM GOAL #2   Title Pt will increase functional use of BUE evidenced by completing Box and Blocks with score of 30 or greater with RUE, 45 or greater with LUE.  Baseline R 26, L 39    Time 12    Period Weeks    Status Revised   15 blocks 06/16/21   Target Date 09/08/21      OT LONG TERM GOAL #3   Title PT will improve grip strength in BUE by increasing grip strength to 10 lbs or greater with RUE and 20 lbs or greater  with LUE.    Baseline R 1.5, L 12.3    Time 12    Period Weeks    Status On-going   RUE 3.3 lbs, LUE 14.5 lbs     OT LONG TERM GOAL #4   Title Pt will improve isolated finger movements in order to increase skill towards simple typing with adapted strategies and equipment PRN.    Baseline isolated movement in LUE    Time 12    Period Weeks    Status On-going      OT LONG TERM GOAL #5   Title Pt will improve 9 hole peg test in LUE to completing in 65 seconds or less in order to increase functional use and demonstrate ability to place 2 or more pegs with RUE.    Baseline L 75.13s, R unable    Time 12    Period Weeks    Status Partially Met   71.19s LUE 05/21/21, was able to place 2 pegs with RUE 05/21/21     OT LONG TERM GOAL #6   Title Pt will report completing UB and LB dressing with decreased assistance consistently.    Baseline UB (reports able to do but not consistently doing), LB total A    Time 12    Period Weeks    Status On-going                   Plan - 07/16/21 1308     Clinical Impression Statement Pt continues to improve and be motivated for increased independence.    OT Occupational Profile and History Detailed Assessment- Review of Records and additional review of physical, cognitive, psychosocial history related to current functional performance    Occupational performance deficits (Please refer to evaluation for details): ADL's;IADL's;Leisure;Work;Rest and Sleep    Body Structure / Function / Physical Skills ADL;IADL;ROM;Strength;Decreased knowledge of use of DME;Dexterity;GMC;Pain;Tone;UE functional use;Body mechanics;Balance;Continence;FMC;Muscle spasms;Skin integrity;Flexibility;Mobility;Sensation;Improper spinal/pelvic alignment;Endurance    Rehab Potential Good    Clinical Decision Making Several treatment options, min-mod task modification necessary    Comorbidities Affecting Occupational Performance: May have comorbidities impacting occupational  performance    Modification or Assistance to Complete Evaluation  Min-Moderate modification of tasks or assist with assess necessary to complete eval    OT Frequency 2x / week    OT Duration 12 weeks   @ renewal 06/16/21   OT Treatment/Interventions Self-care/ADL training;Moist Heat;Fluidtherapy;DME and/or AE instruction;Splinting;Therapeutic activities;Aquatic Therapy;Ultrasound;Therapeutic exercise;Cognitive remediation/compensation;Passive range of motion;Functional Mobility Training;Neuromuscular education;Electrical Stimulation;Paraffin;Manual Therapy;Patient/family education    Plan LB dressing techniques, bed mobility, coordination BUE, fluido from edge of mat, hip external rotation ROM    Consulted and Agree with Plan of Care Patient;Family member/caregiver    Family Member Consulted spouse [name]             Patient will benefit from skilled therapeutic intervention in order to improve the following deficits and impairments:   Body Structure / Function / Physical Skills: ADL, IADL, ROM, Strength, Decreased knowledge of use of DME, Dexterity, GMC, Pain, Tone, UE functional use, Body mechanics, Balance, Continence, FMC, Muscle spasms, Skin integrity, Flexibility, Mobility,  Sensation, Improper spinal/pelvic alignment, Endurance       Visit Diagnosis: Quadriplegia, C5-C7 incomplete (HCC)  Muscle weakness (generalized)  Other abnormalities of gait and mobility  Other lack of coordination  Other disturbances of skin sensation    Problem List Patient Active Problem List   Diagnosis Date Noted   Quadriplegia, C5-C7 incomplete ([place]) 01/16/2021   History of spinal fracture 01/16/2021   Suprapubic catheter ([place]) 01/16/2021   Encounter for routine gynecological examination 09/28/2013   Onychomycosis 09/28/2013   Foot deformity, acquired 03/26/2012   Encounter for preventive health examination 12/25/2010   ROSACEA 08/25/2009   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

## 2021-07-16 NOTE — Therapy (Signed)
[facility] [address] [place], [place], [address] Phone: [phone]   Fax:  [phone]  Physical Therapy Treatment  Patient Details  Name: [name] MRN: [id number] Date of Birth: [birth year] Referring Provider (PT): Landis Gandy   Encounter Date: 07/16/2021   PT End of Session - 07/16/21 1110     Visit Number 45    Number of Visits 33    Date for PT Re-Evaluation 08/07/21    Authorization Type human[employer] medicare; 36 visits 12/5-2/24/23    Authorization - Visit Number 18    Authorization - Number of Visits 36    Progress Note Due on Visit 79    PT Start Time 1103    PT Stop Time 1143    PT Time Calculation (min) 40 min    Equipment Utilized During Treatment Other (comment)   patient's personal leg loop   Activity Tolerance Patient tolerated treatment well    Behavior During Therapy [facility] for tasks assessed/performed             Past Medical History:  Diagnosis Date   CERVICAL POLYP 03/11/2008   Qualifier: Diagnosis of  By: Regis Bill MD, Standley Brooking    Colon polyps 2005   on colonscopy Dr. Fuller Plan   Fibroid 2004   Per Dr. Ouida Sills   History of shingles    face and mouth   Hx of skin cancer, basal cell    Rosacea    Sciatica of left side 09/28/2013   Scoliosis    noted on mri done for back pain    Past Surgical History:  Procedure Laterality Date   BUNIONECTOMY      There were no vitals filed for this visit.   Subjective Assessment - 07/16/21 1110     Subjective Pt reports that she was started on doxycycline for possible UTI. Awaiting sample results. She is having more spasms in right calf at night. Got second leg loop.    Patient is accompained by: Family member   husband, [name]   Pertinent History Pt also takes Toviaz 24m daily. PMH: hyperlipidemia, scoliosis, sleep disorder    Patient Stated Goals Pt would like to be able to walk even if its with assistance. She also wants to be able  to type and improve her ability to do ADLs to allow for more independence.    Currently in Pain? Yes    Pain Score 4     Pain Location Arm    Pain Orientation Right;Left    Pain Descriptors / Indicators Aching;Sharp    Pain Type Neuropathic pain;Chronic pain    Pain Onset More than a month ago    Pain Frequency Constant                               [facility] Adult PT Treatment/Exercise - 07/16/21 1111       Transfers   Transfers Lateral/Scoot Transfers    Lateral/Scoot Transfers 5: Supervision;4: Min assist    Lateral/Scoot Transfer Details (indicate cue type and reason) Powerchair to mat with assist to reposition RLE and place on foot rest with return. Pt assisted LLE with arms but did need some help to slide foot out some so she could move off leg rest and too much friction when bent under her. Performed 2-3 scoots      Neuro Re-ed    Neuro Re-ed Details  Sitting edge of mat: leaning  posterior as far as could control with core and then returning upright x 10 with occasional min assist as less steady today, trunk rotation coming down on elbow x 5 each side with emphasis on controlling to get more oblique activation. Long sit on mat: holding purple ball in front and leaning posterior to large physioball behind her x 10, trunk rotation holding purple ball in front of her x 5 to each side. Holding ball in front with PT providing manual pertubations 30 sec x 2. Shoulder depression/triceps extension pushing down to try to lift bottom x 5 then added 2" blocks under hands to get more lift x 5. Pt able to slightly clear bottom.      Exercises   Exercises Other Exercises    Other Exercises  In long sit performed self stretching for hamstring x 4 minutes.                       PT Short Term Goals - 07/10/21 1105       PT SHORT TERM GOAL #1   Title Pt will consistently use standing frame as part of HEP, 2-3x/week to facilitate LE WB and trunk control    Baseline  07/10/21 Pt is doing standing frame 3 x/week for an hour. She has also obtained a SciFit and is doing 6 days/wk    Time 4    Period Weeks    Status Achieved    Target Date 07/12/21      PT SHORT TERM GOAL #2   Title Pt will be able to lateral scoot transfer without slideboard on level surfaces mod I except for assist to reposition feet.    Baseline 07/10/21 supervision for lateral scoot transfers without slideboard except to reposition feet    Time 4    Period Weeks    Status Partially Met    Target Date 07/12/21      PT SHORT TERM GOAL #3   Title Pt will be able to roll right consistently with help of left leg loop mod I.    Baseline continues to need Intermittent supervision/verbal cues; can perform MOD I 90% of the time    Time 4    Period Weeks    Status On-going    Target Date 07/12/21      PT SHORT TERM GOAL #4   Title Pt will transfer supine to long sit utilizing "C" walk technique to L and R with leg loop and min A consistently    Baseline mod-max A to L; min-mod A to R with leg loop. 07/10/21 "C" walk right with leg loop min assist, left mod assist    Time 4    Period Weeks    Status Partially Met    Target Date 07/12/21      PT SHORT TERM GOAL #5   Title Pt will be able to propel manual w/c 250' supervision for improved mobility and aerobic conditioning/strengthening.    Baseline 07/10/21 Pt has not been using manual chair due to cold outside but will start more when warms up some. When last assessed in clinic performed 345' supervision/CGA on level surfaces    Time 4    Period Weeks    Status Partially Met    Target Date 07/12/21      PT SHORT TERM GOAL #6   Title Pt will consistently perform sit > supine on flat mat with leg loop and supervision; will be able to transition long sit >  short sit MOD I    Baseline 07/10/21 sit to supine with leg loop mod assist only to get RLE on to mat, able to get LLE up with leg loop on own. Long sit to short sit min assist to unweight  feet to move off mat    Time 4    Period Weeks    Status On-going    Target Date 07/12/21               PT Long Term Goals - 05/14/21 1424       PT LONG TERM GOAL #1   Title Pt will be able to perform short sit to supine transfer utilizing leg loop CGA for improved mobility.    Baseline currently able to get left leg up with loop but max assist for right.    Time 12    Period Weeks    Status New    Target Date 08/07/21      PT LONG TERM GOAL #2   Title Pt will be able to perform lateral scoot transfer without slideboard CGA for improved mobility.    Baseline 05/14/21- Pt still uses slide board and requires PT assist to reposition feet.    Time 12    Period Weeks    Status New    Target Date 08/07/21      PT LONG TERM GOAL #3   Title Pt will be able to stand at counter x 2 min mod assist with PT blocking right leg for improved standing ability/ strength.    Baseline 05/14/21- pt uses standing frame at this time    Time 12    Period Weeks    Status Revised    Target Date 08/07/21      PT LONG TERM GOAL #4   Title Pt will be able to maintain long sit without UE support x 5 min for improved core stability and assist with ADLs.    Time 12    Period Weeks    Status New    Target Date 08/07/21      PT LONG TERM GOAL #5   Title Pt will report being able to perform 20 minutes of manual w/c propulsion around home for improved UE strength and mobility.    Baseline 05/14/21- pt has manual w/c but has yet to use it    Time 12    Period Weeks    Status On-going    Target Date 08/07/21      Additional Long Term Goals   Additional Long Term Goals Yes      PT LONG TERM GOAL #6   Title Pt will be able to perform squat/pivot transfer mod assist of caregiver for improved mobility to uneven surfaces.    Baseline unable    Time 12    Period Weeks    Status New    Target Date 08/07/21                   Plan - 07/16/21 1303     Clinical Impression Statement Pt was  less steady in short sit today but does have possible UTI and reports the cold weather doesn't help. Did better in long sit tolerating more pertubations with more core activation to assist. Pt was able to slightly clear bottom with tricep/scapular depression in long sit today.    Personal Factors and Comorbidities Comorbidity 2    Comorbidities scoliosis and sleep disorder    Examination-Activity Limitations Bed Mobility;Locomotion Level;Transfers;Stand;Bathing;Dressing  Examination-Participation Restrictions Investment banker, operational;Yard Work    Merchant navy officer Evolving/Moderate complexity    Rehab Potential Good    PT Frequency 3x / week   plus eval   PT Duration 12 weeks    PT Treatment/Interventions ADLs/Self Care Home Management;Electrical Stimulation;DME Instruction;Neuromuscular re-education;Manual techniques;Therapeutic exercise;Balance training;Therapeutic activities;Cryotherapy;Moist Heat;Functional mobility training;Stair training;Gait training;Patient/family education;Orthotic Fit/Training;Wheelchair mobility training;Dry needling;Passive range of motion;Vestibular    PT Next Visit Plan Trying to cycle treatments-arms, functional mobility, leg strengthening.  Continue slideboard manual w/c <> mat or powerchair and parts management on manual w/c.  Lateral transfer without slideboard powerchair to/from mat for level or downhill, bed mobility. Continue to work on functional strengthening for UE, prone position for scapular strengthening-try unweighting with sheet under chest to be able to walk side to side on forearms. Continue work on long sit with moving legs on mat, balance with less UE support. Utilizing "C" walk to come from sidelying to long sit.  At some point may try bioness to try to get more quad activation with exercises and possibly with standing frame.    Consulted and Agree with Plan of Care Patient;Family member/caregiver    Family Member Consulted  Caregiver - [name]             Patient will benefit from skilled therapeutic intervention in order to improve the following deficits and impairments:  Decreased balance, Decreased mobility, Decreased strength, Impaired sensation, Postural dysfunction, Impaired flexibility, Impaired UE functional use, Impaired tone, Decreased range of motion  Visit Diagnosis: Muscle weakness (generalized)  Quadriplegia, C5-C7 incomplete ([place])     Problem List Patient Active Problem List   Diagnosis Date Noted   Quadriplegia, C5-C7 incomplete ([place]) 01/16/2021   History of spinal fracture 01/16/2021   Suprapubic catheter ([place]) 01/16/2021   Encounter for routine gynecological examination 09/28/2013   Onychomycosis 09/28/2013   Foot deformity, acquired 03/26/2012   Encounter for preventive health examination 12/25/2010   ROSACEA 08/25/2009   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 [place], [place], [address] Phone: [phone]   Fax:  [phone]  Name: Caroleen Stoermer MRN: [id number] Date of Birth: 1950/11/16

## 2021-07-21 ENCOUNTER — Ambulatory Visit: Payer: Medicare PPO | Admitting: Physical Therapy

## 2021-07-21 ENCOUNTER — Other Ambulatory Visit: Payer: Self-pay

## 2021-07-21 DIAGNOSIS — M6281 Muscle weakness (generalized): Secondary | ICD-10-CM

## 2021-07-21 DIAGNOSIS — R2689 Other abnormalities of gait and mobility: Secondary | ICD-10-CM

## 2021-07-21 DIAGNOSIS — R208 Other disturbances of skin sensation: Secondary | ICD-10-CM | POA: Diagnosis not present

## 2021-07-21 DIAGNOSIS — R29818 Other symptoms and signs involving the nervous system: Secondary | ICD-10-CM | POA: Diagnosis not present

## 2021-07-21 DIAGNOSIS — R293 Abnormal posture: Secondary | ICD-10-CM

## 2021-07-21 DIAGNOSIS — R278 Other lack of coordination: Secondary | ICD-10-CM | POA: Diagnosis not present

## 2021-07-21 DIAGNOSIS — G8254 Quadriplegia, C5-C7 incomplete: Secondary | ICD-10-CM

## 2021-07-22 DIAGNOSIS — N312 Flaccid neuropathic bladder, not elsewhere classified: Secondary | ICD-10-CM | POA: Diagnosis not present

## 2021-07-22 NOTE — Therapy (Signed)
[facility] [address] [place], [place], [address] Phone: [phone]   Fax:  [phone]  Physical Therapy Treatment  Patient Details  Name: [name] MRN: [id number] Date of Birth: 05-01-1951 Referring Provider (PT): Landis Gandy   Encounter Date: 07/21/2021   PT End of Session - 07/22/21 1231     Visit Number 46    Number of Visits 72    Date for PT Re-Evaluation 08/07/21    Authorization Type humana medicare; 36 visits 12/5-2/24/23    Authorization - Visit Number 1    Authorization - Number of Visits 36    Progress Note Due on Visit 64    PT Start Time 1530    PT Stop Time 1615    PT Time Calculation (min) 45 min    Activity Tolerance Patient tolerated treatment well    Behavior During Therapy [facility] for tasks assessed/performed             Past Medical History:  Diagnosis Date   CERVICAL POLYP 03/11/2008   Qualifier: Diagnosis of  By: Regis Bill MD, Standley Brooking    Colon polyps 2005   on colonscopy Dr. Fuller Plan   Fibroid 2004   Per Dr. Ouida Sills   History of shingles    face and mouth   Hx of skin cancer, basal cell    Rosacea    Sciatica of left side 09/28/2013   Scoliosis    noted on mri done for back pain    Past Surgical History:  Procedure Laterality Date   BUNIONECTOMY      There were no vitals filed for this visit.   Subjective Assessment - 07/22/21 1230     Subjective Did have UTI; feels better on antibiotics.    Patient is accompained by: Family member   husband, [name]   Pertinent History Pt also takes Toviaz 27m daily. PMH: hyperlipidemia, scoliosis, sleep disorder    Patient Stated Goals Pt would like to be able to walk even if its with assistance. She also wants to be able to type and improve her ability to do ADLs to allow for more independence.    Currently in Pain? No/denies    Pain Onset More than a month ago             Pt and aide performed set up of  wheelchair next to mat and pt performed lateral scooting from w/c > mat on level surface with close supervision and intermittent assistance to reposition R foot due to not wearing leg loop.  Transitioned sit > supine to L side with min-mod A to lift RLE to mat.    Once in supine pt able to flex L knee and performed rolling to R side with 3 half rotations to prepare for rolling; PT assisted with guiding L knee across midline once pt initiated trunk rotation.  Performed sidelying > long sitting x 2 reps from R side and x 1 from L side.  On R side pt required 25-35% assistance at pelvis and ribs to pop up onto R elbow - pt able to perform "C" walking on UE around until able to hook around flexed knee and pull up to long sitting without use of leg loop.  When transitioning from L side pt required 60-70% assistance to initiate sidelying > propped on L elbow but required only min A for support while performing "C" walk around to hook on flexed knee and to complete transition to long sitting.  In between each repetition performed long sitting forward fold for low back and hamstring stretch.  Continue to practice scooting across mat in long sitting, to L and R, using scapular depression and anterior lean. Therapist assisted 50% with clearing buttocks off mat.    Transitioned to short sitting with pt advancing LE to edge of mat with therapist un-weighting shoe to decrease resistance.  Raised mat so pt would not have feet supported and continued to work on sitting balance and trunk control with bilat dynamic UE movements to the side, in front and across midline.  Only required 1-2 assists from PT to prevent forward LOB.    PT Short Term Goals - 07/10/21 1105       PT SHORT TERM GOAL #1   Title Pt will consistently use standing frame as part of HEP, 2-3x/week to facilitate LE WB and trunk control    Baseline 07/10/21 Pt is doing standing frame 3 x/week for an hour. She has also obtained a SciFit and is doing 6  days/wk    Time 4    Period Weeks    Status Achieved    Target Date 07/12/21      PT SHORT TERM GOAL #2   Title Pt will be able to lateral scoot transfer without slideboard on level surfaces mod I except for assist to reposition feet.    Baseline 07/10/21 supervision for lateral scoot transfers without slideboard except to reposition feet    Time 4    Period Weeks    Status Partially Met    Target Date 07/12/21      PT SHORT TERM GOAL #3   Title Pt will be able to roll right consistently with help of left leg loop mod I.    Baseline continues to need Intermittent supervision/verbal cues; can perform MOD I 90% of the time    Time 4    Period Weeks    Status On-going    Target Date 07/12/21      PT SHORT TERM GOAL #4   Title Pt will transfer supine to long sit utilizing "C" walk technique to L and R with leg loop and min A consistently    Baseline mod-max A to L; min-mod A to R with leg loop. 07/10/21 "C" walk right with leg loop min assist, left mod assist    Time 4    Period Weeks    Status Partially Met    Target Date 07/12/21      PT SHORT TERM GOAL #5   Title Pt will be able to propel manual w/c 250' supervision for improved mobility and aerobic conditioning/strengthening.    Baseline 07/10/21 Pt has not been using manual chair due to cold outside but will start more when warms up some. When last assessed in clinic performed 345' supervision/CGA on level surfaces    Time 4    Period Weeks    Status Partially Met    Target Date 07/12/21      PT SHORT TERM GOAL #6   Title Pt will consistently perform sit > supine on flat mat with leg loop and supervision; will be able to transition long sit > short sit MOD I    Baseline 07/10/21 sit to supine with leg loop mod assist only to get RLE on to mat, able to get LLE up with leg loop on own. Long sit to short sit min assist to unweight feet to move off mat    Time 4  Period Weeks    Status On-going    Target Date 07/12/21                PT Long Term Goals - 05/14/21 1424       PT LONG TERM GOAL #1   Title Pt will be able to perform short sit to supine transfer utilizing leg loop CGA for improved mobility.    Baseline currently able to get left leg up with loop but max assist for right.    Time 12    Period Weeks    Status New    Target Date 08/07/21      PT LONG TERM GOAL #2   Title Pt will be able to perform lateral scoot transfer without slideboard CGA for improved mobility.    Baseline 05/14/21- Pt still uses slide board and requires PT assist to reposition feet.    Time 12    Period Weeks    Status New    Target Date 08/07/21      PT LONG TERM GOAL #3   Title Pt will be able to stand at counter x 2 min mod assist with PT blocking right leg for improved standing ability/ strength.    Baseline 05/14/21- pt uses standing frame at this time    Time 12    Period Weeks    Status Revised    Target Date 08/07/21      PT LONG TERM GOAL #4   Title Pt will be able to maintain long sit without UE support x 5 min for improved core stability and assist with ADLs.    Time 12    Period Weeks    Status New    Target Date 08/07/21      PT LONG TERM GOAL #5   Title Pt will report being able to perform 20 minutes of manual w/c propulsion around home for improved UE strength and mobility.    Baseline 05/14/21- pt has manual w/c but has yet to use it    Time 12    Period Weeks    Status On-going    Target Date 08/07/21      Additional Long Term Goals   Additional Long Term Goals Yes      PT LONG TERM GOAL #6   Title Pt will be able to perform squat/pivot transfer mod assist of caregiver for improved mobility to uneven surfaces.    Baseline unable    Time 12    Period Weeks    Status New    Target Date 08/07/21              Plan - 07/22/21 1539     Clinical Impression Statement Pt continues to do well with transfers without slideboard.  Continued to challenge patient today with sit > supine to L  side and continuing to work on supine <> long sit from R side and L side.  Pt was able to perform roll to R in 3 attempts instead of 4 and was able to perform supine > long sit without use of leg loops but did continue to hook UE under flexed knee to complete transition.  Also demonstrated progress with short sitting balance today and was able to perform dynamic UE movement without back support or feet supported.    Personal Factors and Comorbidities Comorbidity 2    Comorbidities scoliosis and sleep disorder    Examination-Activity Limitations Bed Mobility;Locomotion Level;Transfers;Stand;Bathing;Dressing    Examination-Participation Freight forwarder;Valla Leaver Work  Stability/Clinical Decision Making Evolving/Moderate complexity    Rehab Potential Good    PT Frequency 3x / week   plus eval   PT Duration 12 weeks    PT Treatment/Interventions ADLs/Self Care Home Management;Electrical Stimulation;DME Instruction;Neuromuscular re-education;Manual techniques;Therapeutic exercise;Balance training;Therapeutic activities;Cryotherapy;Moist Heat;Functional mobility training;Stair training;Gait training;Patient/family education;Orthotic Fit/Training;Wheelchair mobility training;Dry needling;Passive range of motion;Vestibular    PT Next Visit Plan Trying to cycle treatments-arms, functional mobility, leg strengthening.  Continue slideboard manual w/c <> mat or powerchair and parts management on manual w/c.  Lateral transfer without slideboard powerchair to/from mat for level or downhill, bed mobility. Continue to work on functional strengthening for UE, prone position for scapular strengthening-try unweighting with sheet under chest to be able to walk side to side on forearms. Continue work on long sit with moving legs on mat, balance with less UE support. Utilizing "C" walk to come from sidelying to long sit.  At some point may try bioness to try to get more quad activation with  exercises and possibly with standing frame.    Consulted and Agree with Plan of Care Patient;Family member/caregiver    Family Member Consulted Caregiver - [name]             Patient will benefit from skilled therapeutic intervention in order to improve the following deficits and impairments:  Decreased balance, Decreased mobility, Decreased strength, Impaired sensation, Postural dysfunction, Impaired flexibility, Impaired UE functional use, Impaired tone, Decreased range of motion  Visit Diagnosis: Muscle weakness (generalized)  Quadriplegia, C5-C7 incomplete ([facility])  Other abnormalities of gait and mobility  Other disturbances of skin sensation  Abnormal posture  Other symptoms and signs involving the nervous system     Problem List Patient Active Problem List   Diagnosis Date Noted   Quadriplegia, C5-C7 incomplete ([place]) 01/16/2021   History of spinal fracture 01/16/2021   Suprapubic catheter ([place]) 01/16/2021   Encounter for routine gynecological examination 09/28/2013   Onychomycosis 09/28/2013   Foot deformity, acquired 03/26/2012   Encounter for preventive health examination 12/25/2010   ROSACEA 08/25/2009   Disturbance in sleep behavior 03/11/2008   SKIN CANCER, HX OF 03/11/2008   DYSURIA, HX OF 03/11/2008   Hyperlipidemia 02/10/2007   CERVICALGIA 02/10/2007    Rico Junker, PT, DPT 07/22/21    4:41 PM    [facility] [address] [place], [place], [address] Phone: [phone]   Fax:  [phone]  Name: Nonna Renninger MRN: [id number] Date of Birth: 1950/11/25

## 2021-07-23 ENCOUNTER — Encounter: Payer: Medicare PPO | Admitting: Occupational Therapy

## 2021-07-24 ENCOUNTER — Ambulatory Visit: Payer: Medicare PPO

## 2021-07-24 ENCOUNTER — Encounter: Payer: Self-pay | Admitting: Occupational Therapy

## 2021-07-24 ENCOUNTER — Other Ambulatory Visit: Payer: Self-pay

## 2021-07-24 ENCOUNTER — Ambulatory Visit: Payer: Medicare PPO | Admitting: Occupational Therapy

## 2021-07-24 DIAGNOSIS — M6281 Muscle weakness (generalized): Secondary | ICD-10-CM

## 2021-07-24 DIAGNOSIS — G8254 Quadriplegia, C5-C7 incomplete: Secondary | ICD-10-CM

## 2021-07-24 DIAGNOSIS — R278 Other lack of coordination: Secondary | ICD-10-CM | POA: Diagnosis not present

## 2021-07-24 DIAGNOSIS — R2689 Other abnormalities of gait and mobility: Secondary | ICD-10-CM | POA: Diagnosis not present

## 2021-07-24 DIAGNOSIS — R29818 Other symptoms and signs involving the nervous system: Secondary | ICD-10-CM

## 2021-07-24 DIAGNOSIS — R293 Abnormal posture: Secondary | ICD-10-CM | POA: Diagnosis not present

## 2021-07-24 DIAGNOSIS — R208 Other disturbances of skin sensation: Secondary | ICD-10-CM

## 2021-07-24 NOTE — Therapy (Signed)
[facility] [address] [place], [place], [address] Phone: [phone]   Fax:  [phone]  Physical Therapy Treatment  Patient Details  Name: [name] MRN: [id number] Date of Birth: 04/03/1951 Referring Provider (PT): Landis Gandy   Encounter Date: 07/24/2021   PT End of Session - 07/24/21 1147     Visit Number 5    Number of Visits 72    Date for PT Re-Evaluation 08/07/21    Authorization Type [employer]; 36 visits 12/5-2/24/23    Authorization - Visit Number 50    Authorization - Number of Visits 36    Progress Note Due on Visit 28    PT Start Time 1146    PT Stop Time 1230    PT Time Calculation (min) 44 min    Activity Tolerance Patient tolerated treatment well    Behavior During Therapy [facility] for tasks assessed/performed             Past Medical History:  Diagnosis Date   CERVICAL POLYP 03/11/2008   Qualifier: Diagnosis of  By: Regis Bill MD, Standley Brooking    Colon polyps 2005   on colonscopy Dr. Fuller Plan   Fibroid 2004   Per Dr. Ouida Sills   History of shingles    face and mouth   Hx of skin cancer, basal cell    Rosacea    Sciatica of left side 09/28/2013   Scoliosis    noted on mri done for back pain    Past Surgical History:  Procedure Laterality Date   BUNIONECTOMY      There were no vitals filed for this visit.   Subjective Assessment - 07/24/21 1148     Subjective Pt reports that she went to urologist. Did really well on her uroldynamic test and was able to void when asked. They think that's promising that she may be able to regain control of bladder. Pt did stepper 20 minutes yesterday.    Patient is accompained by: Family member   husband, [name]   Pertinent History Pt also takes Toviaz 35m daily. PMH: hyperlipidemia, scoliosis, sleep disorder    Patient Stated Goals Pt would like to be able to walk even if its with assistance. She also wants to be able to type and  improve her ability to do ADLs to allow for more independence.    Currently in Pain? Yes    Pain Score 4     Pain Location Arm    Pain Orientation Right;Left    Pain Descriptors / Indicators Aching;Sharp    Pain Type Neuropathic pain    Pain Onset More than a month ago    Pain Frequency Constant                               [facility] Adult PT Treatment/Exercise - 07/24/21 1150       Transfers   Transfers Lateral/Scoot Transfers    Lateral/Scoot Transfers 5: Supervision;4: Min assist    Lateral/Scoot Transfer Details (indicate cue type and reason) Powerchair to mat with assist to reposition RLE to floor.      Therapeutic Activites    Therapeutic Activities Other Therapeutic Activities    Other Therapeutic Activities Short sit to supine with pt trying to throw LLE on to mat with min assist from PT. Max assist to lift RLE on mat.      Exercises   Exercises Other Exercises  Other Exercises  Seated edge of mat: left LAQ x 10 through almost full range then 8 x 2 with 3# weight on ankle. Right isometric/slight isotonic initiation with YTB behind heel to float foot and moving some with it 5 x 2, left resisted hamstring curl with YTB 10 x 2. Left hip abd/add trying to light leg slightly 10 x 2, right hip abd/add with foot on towel and PT moving in to abd and then pt pulling foot back in 5 x 2. Supine left hip abd/adduction with support under heel 10 x 2. Hooklying isometric hip abd/add 5 x 2 each leg with 5 sec holds. Minimal activation with right hip abduction/ER. Hooklying LLE pushing to try to initiate roll lifting pelvis slightly x 10.                     PT Education - 07/24/21 1853     Education Details Issued YTB for pt to try seated hamstring curl at home and for isometric hip abduction.    Person(s) Educated Patient;Caregiver(s)    Methods Explanation;Demonstration    Comprehension Verbalized understanding;Returned demonstration              PT  Short Term Goals - 07/10/21 1105       PT SHORT TERM GOAL #1   Title Pt will consistently use standing frame as part of HEP, 2-3x/week to facilitate LE WB and trunk control    Baseline 07/10/21 Pt is doing standing frame 3 x/week for an hour. She has also obtained a SciFit and is doing 6 days/wk    Time 4    Period Weeks    Status Achieved    Target Date 07/12/21      PT SHORT TERM GOAL #2   Title Pt will be able to lateral scoot transfer without slideboard on level surfaces mod I except for assist to reposition feet.    Baseline 07/10/21 supervision for lateral scoot transfers without slideboard except to reposition feet    Time 4    Period Weeks    Status Partially Met    Target Date 07/12/21      PT SHORT TERM GOAL #3   Title Pt will be able to roll right consistently with help of left leg loop mod I.    Baseline continues to need Intermittent supervision/verbal cues; can perform MOD I 90% of the time    Time 4    Period Weeks    Status On-going    Target Date 07/12/21      PT SHORT TERM GOAL #4   Title Pt will transfer supine to long sit utilizing "C" walk technique to L and R with leg loop and min A consistently    Baseline mod-max A to L; min-mod A to R with leg loop. 07/10/21 "C" walk right with leg loop min assist, left mod assist    Time 4    Period Weeks    Status Partially Met    Target Date 07/12/21      PT SHORT TERM GOAL #5   Title Pt will be able to propel manual w/c 250' supervision for improved mobility and aerobic conditioning/strengthening.    Baseline 07/10/21 Pt has not been using manual chair due to cold outside but will start more when warms up some. When last assessed in clinic performed 345' supervision/CGA on level surfaces    Time 4    Period Weeks    Status Partially Met  Target Date 07/12/21      PT SHORT TERM GOAL #6   Title Pt will consistently perform sit > supine on flat mat with leg loop and supervision; will be able to transition long sit >  short sit MOD I    Baseline 07/10/21 sit to supine with leg loop mod assist only to get RLE on to mat, able to get LLE up with leg loop on own. Long sit to short sit min assist to unweight feet to move off mat    Time 4    Period Weeks    Status On-going    Target Date 07/12/21               PT Long Term Goals - 05/14/21 1424       PT LONG TERM GOAL #1   Title Pt will be able to perform short sit to supine transfer utilizing leg loop CGA for improved mobility.    Baseline currently able to get left leg up with loop but max assist for right.    Time 12    Period Weeks    Status New    Target Date 08/07/21      PT LONG TERM GOAL #2   Title Pt will be able to perform lateral scoot transfer without slideboard CGA for improved mobility.    Baseline 05/14/21- Pt still uses slide board and requires PT assist to reposition feet.    Time 12    Period Weeks    Status New    Target Date 08/07/21      PT LONG TERM GOAL #3   Title Pt will be able to stand at counter x 2 min mod assist with PT blocking right leg for improved standing ability/ strength.    Baseline 05/14/21- pt uses standing frame at this time    Time 12    Period Weeks    Status Revised    Target Date 08/07/21      PT LONG TERM GOAL #4   Title Pt will be able to maintain long sit without UE support x 5 min for improved core stability and assist with ADLs.    Time 12    Period Weeks    Status New    Target Date 08/07/21      PT LONG TERM GOAL #5   Title Pt will report being able to perform 20 minutes of manual w/c propulsion around home for improved UE strength and mobility.    Baseline 05/14/21- pt has manual w/c but has yet to use it    Time 12    Period Weeks    Status On-going    Target Date 08/07/21      Additional Long Term Goals   Additional Long Term Goals Yes      PT LONG TERM GOAL #6   Title Pt will be able to perform squat/pivot transfer mod assist of caregiver for improved mobility to uneven  surfaces.    Baseline unable    Time 12    Period Weeks    Status New    Target Date 08/07/21                   Plan - 07/24/21 1854     Clinical Impression Statement PT focused on bilateral lower extremity strengthening. Pt continues to show improvement especially in LLE. Minimal activation in RLE but is starting to get more recruitment.    Personal Factors and Comorbidities Comorbidity 2  Comorbidities scoliosis and sleep disorder    Examination-Activity Limitations Bed Mobility;Locomotion Level;Transfers;Stand;Bathing;Dressing    Examination-Participation Freight forwarder;Yard Work    Merchant navy officer Evolving/Moderate complexity    Rehab Potential Good    PT Frequency 3x / week   plus eval   PT Duration 12 weeks    PT Treatment/Interventions ADLs/Self Care Home Management;Electrical Stimulation;DME Instruction;Neuromuscular re-education;Manual techniques;Therapeutic exercise;Balance training;Therapeutic activities;Cryotherapy;Moist Heat;Functional mobility training;Stair training;Gait training;Patient/family education;Orthotic Fit/Training;Wheelchair mobility training;Dry needling;Passive range of motion;Vestibular    PT Next Visit Plan Trying to cycle treatments-arms, functional mobility, leg strengthening.  Continue slideboard manual w/c <> mat or powerchair and parts management on manual w/c.  Lateral transfer without slideboard powerchair to/from mat for level or downhill, bed mobility. Continue to work on functional strengthening for UE, prone position for scapular strengthening-try unweighting with sheet under chest to be able to walk side to side on forearms. Continue work on long sit with moving legs on mat, balance with less UE support. Utilizing "C" walk to come from sidelying to long sit.  At some point may try bioness to try to get more quad activation with exercises and possibly with standing frame.    Consulted and  Agree with Plan of Care Patient;Family member/caregiver    Family Member Consulted Caregiver - [name]             Patient will benefit from skilled therapeutic intervention in order to improve the following deficits and impairments:  Decreased balance, Decreased mobility, Decreased strength, Impaired sensation, Postural dysfunction, Impaired flexibility, Impaired UE functional use, Impaired tone, Decreased range of motion  Visit Diagnosis: Muscle weakness (generalized)  Quadriplegia, C5-C7 incomplete ([place])     Problem List Patient Active Problem List   Diagnosis Date Noted   Quadriplegia, C5-C7 incomplete ([place]) 01/16/2021   History of spinal fracture 01/16/2021   Suprapubic catheter ([place]) 01/16/2021   Encounter for routine gynecological examination 09/28/2013   Onychomycosis 09/28/2013   Foot deformity, acquired 03/26/2012   Encounter for preventive health examination 12/25/2010   ROSACEA 08/25/2009   Disturbance in sleep behavior 03/11/2008   SKIN CANCER, HX OF 03/11/2008   DYSURIA, HX OF 03/11/2008   Hyperlipidemia 02/10/2007   CERVICALGIA 02/10/2007    Electa Sniff, PT, DPT, NCS 07/24/2021, 7:01 PM  [facility] [address] [place], [place], [address] Phone: [phone]   Fax:  [phone]  Name: Naveen Lorusso MRN: [id number] Date of Birth: 06-07-1951

## 2021-07-24 NOTE — Therapy (Signed)
[facility] [address] [place], [place], [address] Phone: [phone]   Fax:  [phone]  Occupational Therapy Treatment & Progress Note  Patient Details  Name: [name] MRN: [id number] Date of Birth: 1951-02-11 Referring Provider (OT): Ina Homes, MD   Encounter Date: 07/24/2021   OT End of Session - 07/24/21 1234     Visit Number 34    Number of Visits 48   +24 visits at renewal 06/16/21   Date for OT Re-Evaluation 09/08/21   add at renewal   Authorization Type [facility] Medicare    Authorization Time Period Auth Req'd - 25 visits 05/06/21 - 07/19/21    Authorization - Visit Number 11    Authorization - Number of Visits 41   16+25   Progress Note Due on Visit 59    OT Start Time 1230    OT Stop Time 1315    OT Time Calculation (min) 45 min    Activity Tolerance Patient tolerated treatment well    Behavior During Therapy [facility] for tasks assessed/performed             Past Medical History:  Diagnosis Date   CERVICAL POLYP 03/11/2008   Qualifier: Diagnosis of  By: Regis Bill MD, Standley Brooking    Colon polyps 2005   on colonscopy Dr. Fuller Plan   Fibroid 2004   Per Dr. Ouida Sills   History of shingles    face and mouth   Hx of skin cancer, basal cell    Rosacea    Sciatica of left side 09/28/2013   Scoliosis    noted on mri done for back pain    Past Surgical History:  Procedure Laterality Date   BUNIONECTOMY      There were no vitals filed for this visit.   Subjective Assessment - 07/24/21 1234     Subjective  "Thanks for getting me early - that's helpful"    Patient is accompanied by: --   caregiver   Pertinent History hyperlipidemia, scoliosis, sleep disorder    Patient Stated Goals regain arm strength for rolling over in bed and fine motor coordination for increasing typing    Currently in Pain? Yes    Pain Score 4     Pain Location Arm    Pain Orientation Right;Left    Pain  Descriptors / Indicators Aching;Sharp    Pain Type Neuropathic pain    Pain Onset More than a month ago    Pain Frequency Constant                          OT Treatments/Exercises (OP) - 07/24/21 0001       Shoulder Exercises: Supine   Other Supine Exercises with red theraband for scap retraction x 10, bicep curls x 10      Shoulder Exercises: Seated   Other Seated Exercises edge of mat - red theraband, tricep extension, bicep curls, shoulder flexion, chest press/row x 10 reps      Shoulder Exercises: Power Development worker, community Other (comment)   error     Fine Motor Coordination (Hand/Wrist)   Manipulation of small objects geoboards for isolated finger strengthening and bimanual coordination                      OT Short Term Goals - 06/16/21 1559       OT SHORT TERM GOAL #1   Title Pt will  be independent with HEP w CG assistance PRN    Time 4    Period Weeks    Status Achieved    Target Date 04/13/21      OT SHORT TERM GOAL #2   Title Pt will verbalize understanding and report independence with CG assistance with ues of modalities at home with good safety.    Baseline has paraffin and Estim unit    Time 4    Period Weeks    Status Achieved   pt has new caregiver she is training for paraffin - reviewed and demonstrated understanding of estim unit     OT SHORT TERM GOAL #3   Title Pt will increase Box and Blocks score with RUE to 30 blocks or greater    Baseline R 26 L 39    Time 4    Period Weeks    Status Deferred   R 22 blocks  04/16/21, 5 blocks RUE 05/21/21 d/t finger cramping, 15 blocks 06/16/21 - deferred to updated LTG     OT SHORT TERM GOAL #4   Title Pt will increase BUE tricep strength to 4/5 consistently for increasing ability to perform SB transfers with supervision/set up assistance only.    Baseline 3+/5 strength BUE    Time 4    Period Weeks    Status Achieved      OT SHORT TERM GOAL #5   Title Pt will increase coordination  in LUE to completing 9 hole peg test in 70 seconds or less.    Baseline L 75.13s, R unable    Time 4    Period Weeks    Status Achieved   L 63.87s 04/16/21     OT SHORT TERM GOAL #6   Title Pt will verbalize understanding of adapted strategies and/or equipment for increasing indepenence with ADLs and IADLs (typing, bathing, cutting food, etc)    Baseline has U cuff    Time 4    Period Weeks    Status Achieved   has verbalized understanding of a lot of AE but continues to benefit from education              OT Long Term Goals - 07/24/21 1326       OT LONG TERM GOAL #1   Title Pt will be independence with any updated HEP    Time 12    Period Weeks    Status On-going   continuing to update in plan of care.   Target Date 09/08/21      OT LONG TERM GOAL #2   Title Pt will increase functional use of BUE evidenced by completing Box and Blocks with score of 30 or greater with RUE, 45 or greater with LUE.    Baseline R 26, L 39    Time 12    Period Weeks    Status Revised   15 blocks 06/16/21   Target Date 09/08/21      OT LONG TERM GOAL #3   Title PT will improve grip strength in BUE by increasing grip strength to 10 lbs or greater with RUE and 20 lbs or greater with LUE.    Baseline R 1.5, L 12.3    Time 12    Period Weeks    Status On-going   RUE 3.3 lbs, LUE 14.5 lbs     OT LONG TERM GOAL #4   Title Pt will improve isolated finger movements in order to increase skill towards simple typing with adapted  strategies and equipment PRN.    Baseline isolated movement in LUE    Time 12    Period Weeks    Status Achieved   pt is typing for job related tasks at this time     [place] #5   Title Pt will improve 9 hole peg test in LUE to completing in 65 seconds or less in order to increase functional use and demonstrate ability to place 2 or more pegs with RUE.    Baseline L 75.13s, R unable    Time 12    Period Weeks    Status Partially Met   71.19s LUE 05/21/21, was  able to place 2 pegs with RUE 05/21/21     OT LONG TERM GOAL #6   Title Pt will report completing UB and LB dressing with decreased assistance consistently.    Baseline UB (reports able to do but not consistently doing), LB total A    Time 12    Period Weeks    Status On-going   has progressed with increased independence however continuing to progress and work towards increasing independence with therapeutic interventions.                  Plan - 07/24/21 1324     Clinical Impression Statement this note serving as progress note for past 10 visits. Pt continues to progress with increased hip range of motion, bed mobility, increasing independence and BUE strengthening and coordination for increasing independence. Skilled occupational therapy is recommended to continue progressing towards these skills in order to increase independence and decrease caregiver burden. Pt has met 5/6 STGs and 1/6 LTGs. Continue to progress.    OT Occupational Profile and History Detailed Assessment- Review of Records and additional review of physical, cognitive, psychosocial history related to current functional performance    Occupational performance deficits (Please refer to evaluation for details): ADL's;IADL's;Leisure;Work;Rest and Sleep    Body Structure / Function / Physical Skills ADL;IADL;ROM;Strength;Decreased knowledge of use of DME;Dexterity;GMC;Pain;Tone;UE functional use;Body mechanics;Balance;Continence;FMC;Muscle spasms;Skin integrity;Flexibility;Mobility;Sensation;Improper spinal/pelvic alignment;Endurance    Rehab Potential Good    Clinical Decision Making Several treatment options, min-mod task modification necessary    Comorbidities Affecting Occupational Performance: May have comorbidities impacting occupational performance    Modification or Assistance to Complete Evaluation  Min-Moderate modification of tasks or assist with assess necessary to complete eval    OT Frequency 2x / week    OT  Duration 12 weeks   @ renewal 06/16/21   OT Treatment/Interventions Self-care/ADL training;Moist Heat;Fluidtherapy;DME and/or AE instruction;Splinting;Therapeutic activities;Aquatic Therapy;Ultrasound;Therapeutic exercise;Cognitive remediation/compensation;Passive range of motion;Functional Mobility Training;Neuromuscular education;Electrical Stimulation;Paraffin;Manual Therapy;Patient/family education    Plan LB dressing techniques, bed mobility, coordination BUE, fluido from edge of mat, hip external rotation ROM    Consulted and Agree with Plan of Care Patient;Family member/caregiver    Family Member Consulted spouse [name]             Patient will benefit from skilled therapeutic intervention in order to improve the following deficits and impairments:   Body Structure / Function / Physical Skills: ADL, IADL, ROM, Strength, Decreased knowledge of use of DME, Dexterity, GMC, Pain, Tone, UE functional use, Body mechanics, Balance, Continence, FMC, Muscle spasms, Skin integrity, Flexibility, Mobility, Sensation, Improper spinal/pelvic alignment, Endurance       Visit Diagnosis: Muscle weakness (generalized)  Quadriplegia, C5-C7 incomplete ([facility])  Other abnormalities of gait and mobility  Other disturbances of skin sensation  Other symptoms and signs involving the nervous system  Other  lack of coordination    Problem List Patient Active Problem List   Diagnosis Date Noted   Quadriplegia, C5-C7 incomplete ([place]) 01/16/2021   History of spinal fracture 01/16/2021   Suprapubic catheter ([place]) 01/16/2021   Encounter for routine gynecological examination 09/28/2013   Onychomycosis 09/28/2013   Foot deformity, acquired 03/26/2012   Encounter for preventive health examination 12/25/2010   ROSACEA 08/25/2009   Disturbance in sleep behavior 03/11/2008   SKIN CANCER, HX OF 03/11/2008   DYSURIA, HX OF 03/11/2008   Hyperlipidemia 02/10/2007   CERVICALGIA 02/10/2007    Zachery Conch, OT 07/24/2021, 1:28 PM  [facility] [address] [place], [place], [address] Phone: [phone]   Fax:  [phone]  Name: Clemma Johnsen MRN: [id number] Date of Birth: 11-26-1950

## 2021-07-28 ENCOUNTER — Other Ambulatory Visit: Payer: Self-pay

## 2021-07-28 ENCOUNTER — Ambulatory Visit: Payer: Medicare PPO | Admitting: Occupational Therapy

## 2021-07-28 ENCOUNTER — Encounter: Payer: Self-pay | Admitting: Occupational Therapy

## 2021-07-28 ENCOUNTER — Ambulatory Visit: Payer: Medicare PPO

## 2021-07-28 DIAGNOSIS — R278 Other lack of coordination: Secondary | ICD-10-CM

## 2021-07-28 DIAGNOSIS — R29818 Other symptoms and signs involving the nervous system: Secondary | ICD-10-CM | POA: Diagnosis not present

## 2021-07-28 DIAGNOSIS — M6281 Muscle weakness (generalized): Secondary | ICD-10-CM | POA: Diagnosis not present

## 2021-07-28 DIAGNOSIS — G8254 Quadriplegia, C5-C7 incomplete: Secondary | ICD-10-CM | POA: Diagnosis not present

## 2021-07-28 DIAGNOSIS — R293 Abnormal posture: Secondary | ICD-10-CM

## 2021-07-28 DIAGNOSIS — R2689 Other abnormalities of gait and mobility: Secondary | ICD-10-CM

## 2021-07-28 DIAGNOSIS — R208 Other disturbances of skin sensation: Secondary | ICD-10-CM | POA: Diagnosis not present

## 2021-07-28 NOTE — Therapy (Signed)
[facility] [address] [place], [place], [address] Phone: [phone]   Fax:  [phone]  Occupational Therapy Treatment  Patient Details  Name: [name] MRN: [id number] Date of Birth: Nov 07, 1950 Referring Provider (OT): Ina Homes, MD   Encounter Date: 07/28/2021   OT End of Session - 07/28/21 1625     Visit Number 35    Number of Visits 48   +24 visits at renewal 06/16/21   Date for OT Re-Evaluation 09/08/21   add at renewal   Authorization Type [employer]    Authorization Time Period Auth Req'd - 25 visits 05/06/21 - 07/19/21    Authorization - Visit Number 37    Authorization - Number of Visits 41   16+25   Progress Note Due on Visit 27    OT Start Time 1615    OT Stop Time 1700    OT Time Calculation (min) 45 min    Activity Tolerance Patient tolerated treatment well    Behavior During Therapy [facility] for tasks assessed/performed             Past Medical History:  Diagnosis Date   CERVICAL POLYP 03/11/2008   Qualifier: Diagnosis of  By: Regis Bill MD, Standley Brooking    Colon polyps 2005   on colonscopy Dr. Fuller Plan   Fibroid 2004   Per Dr. Ouida Sills   History of shingles    face and mouth   Hx of skin cancer, basal cell    Rosacea    Sciatica of left side 09/28/2013   Scoliosis    noted on mri done for back pain    Past Surgical History:  Procedure Laterality Date   BUNIONECTOMY      There were no vitals filed for this visit.   Subjective Assessment - 07/28/21 1624     Subjective  I'm here later therapy today.    Patient is accompanied by: --   caregiver   Pertinent History hyperlipidemia, scoliosis, sleep disorder    Patient Stated Goals regain arm strength for rolling over in bed and fine motor coordination for increasing typing    Currently in Pain? Yes    Pain Score 4     Pain Location Arm    Pain Orientation Right;Left    Pain Descriptors / Indicators Aching    Pain  Type Neuropathic pain    Pain Onset More than a month ago    Pain Frequency Constant                          OT Treatments/Exercises (OP) - 07/28/21 0001       Neurological Re-education Exercises   Other Exercises 1 Equalizer x 10 lbs with scap retraction and protraction with use of UE active hands. MIn A for getting started at end range. 10 reps x 2 sets      Fine Motor Coordination (Hand/Wrist)   Fine Motor Coordination Tossing ball    Manipulation of small objects Connect Four with RUE with picking up chips and placing into frame with good cooridnation and grasp/release and endurance. Then with LUE with in hand manipulation working on using thumb to pull chip into lateral pinch for placing into frame.    Tossing ball for gross motor coordination with BUE                      OT Short Term Goals - 06/16/21 1559  OT SHORT TERM GOAL #1   Title Pt will be independent with HEP w CG assistance PRN    Time 4    Period Weeks    Status Achieved    Target Date 04/13/21      OT SHORT TERM GOAL #2   Title Pt will verbalize understanding and report independence with CG assistance with ues of modalities at home with good safety.    Baseline has paraffin and Estim unit    Time 4    Period Weeks    Status Achieved   pt has new caregiver she is training for paraffin - reviewed and demonstrated understanding of estim unit     OT SHORT TERM GOAL #3   Title Pt will increase Box and Blocks score with RUE to 30 blocks or greater    Baseline R 26 L 39    Time 4    Period Weeks    Status Deferred   R 22 blocks  04/16/21, 5 blocks RUE 05/21/21 d/t finger cramping, 15 blocks 06/16/21 - deferred to updated LTG     OT SHORT TERM GOAL #4   Title Pt will increase BUE tricep strength to 4/5 consistently for increasing ability to perform SB transfers with supervision/set up assistance only.    Baseline 3+/5 strength BUE    Time 4    Period Weeks    Status Achieved       OT SHORT TERM GOAL #5   Title Pt will increase coordination in LUE to completing 9 hole peg test in 70 seconds or less.    Baseline L 75.13s, R unable    Time 4    Period Weeks    Status Achieved   L 63.87s 04/16/21     OT SHORT TERM GOAL #6   Title Pt will verbalize understanding of adapted strategies and/or equipment for increasing indepenence with ADLs and IADLs (typing, bathing, cutting food, etc)    Baseline has U cuff    Time 4    Period Weeks    Status Achieved   has verbalized understanding of a lot of AE but continues to benefit from education              OT Long Term Goals - 07/24/21 1326       OT LONG TERM GOAL #1   Title Pt will be independence with any updated HEP    Time 12    Period Weeks    Status On-going   continuing to update in plan of care.   Target Date 09/08/21      OT LONG TERM GOAL #2   Title Pt will increase functional use of BUE evidenced by completing Box and Blocks with score of 30 or greater with RUE, 45 or greater with LUE.    Baseline R 26, L 39    Time 12    Period Weeks    Status Revised   15 blocks 06/16/21   Target Date 09/08/21      OT LONG TERM GOAL #3   Title PT will improve grip strength in BUE by increasing grip strength to 10 lbs or greater with RUE and 20 lbs or greater with LUE.    Baseline R 1.5, L 12.3    Time 12    Period Weeks    Status On-going   RUE 3.3 lbs, LUE 14.5 lbs     OT LONG TERM GOAL #4   Title Pt will improve isolated finger movements  in order to increase skill towards simple typing with adapted strategies and equipment PRN.    Baseline isolated movement in LUE    Time 12    Period Weeks    Status Achieved   pt is typing for job related tasks at this time     [place] #5   Title Pt will improve 9 hole peg test in LUE to completing in 65 seconds or less in order to increase functional use and demonstrate ability to place 2 or more pegs with RUE.    Baseline L 75.13s, R unable    Time 12     Period Weeks    Status Partially Met   71.19s LUE 05/21/21, was able to place 2 pegs with RUE 05/21/21     OT LONG TERM GOAL #6   Title Pt will report completing UB and LB dressing with decreased assistance consistently.    Baseline UB (reports able to do but not consistently doing), LB total A    Time 12    Period Weeks    Status On-going   has progressed with increased independence however continuing to progress and work towards increasing independence with therapeutic interventions.                  Plan - 07/28/21 1704     Clinical Impression Statement pt continues to progress towards unmet goals and with BUE coordination and strengthening    OT Occupational Profile and History Detailed Assessment- Review of Records and additional review of physical, cognitive, psychosocial history related to current functional performance    Occupational performance deficits (Please refer to evaluation for details): ADL's;IADL's;Leisure;Work;Rest and Sleep    Body Structure / Function / Physical Skills ADL;IADL;ROM;Strength;Decreased knowledge of use of DME;Dexterity;GMC;Pain;Tone;UE functional use;Body mechanics;Balance;Continence;FMC;Muscle spasms;Skin integrity;Flexibility;Mobility;Sensation;Improper spinal/pelvic alignment;Endurance    Rehab Potential Good    Clinical Decision Making Several treatment options, min-mod task modification necessary    Comorbidities Affecting Occupational Performance: May have comorbidities impacting occupational performance    Modification or Assistance to Complete Evaluation  Min-Moderate modification of tasks or assist with assess necessary to complete eval    OT Frequency 2x / week    OT Duration 12 weeks   @ renewal 06/16/21   OT Treatment/Interventions Self-care/ADL training;Moist Heat;Fluidtherapy;DME and/or AE instruction;Splinting;Therapeutic activities;Aquatic Therapy;Ultrasound;Therapeutic exercise;Cognitive remediation/compensation;Passive range of  motion;Functional Mobility Training;Neuromuscular education;Electrical Stimulation;Paraffin;Manual Therapy;Patient/family education    Plan LB dressing techniques, bed mobility, coordination BUE, fluido from edge of mat, hip external rotation ROM    Consulted and Agree with Plan of Care Patient;Family member/caregiver    Family Member Consulted spouse [name]             Patient will benefit from skilled therapeutic intervention in order to improve the following deficits and impairments:   Body Structure / Function / Physical Skills: ADL, IADL, ROM, Strength, Decreased knowledge of use of DME, Dexterity, GMC, Pain, Tone, UE functional use, Body mechanics, Balance, Continence, FMC, Muscle spasms, Skin integrity, Flexibility, Mobility, Sensation, Improper spinal/pelvic alignment, Endurance       Visit Diagnosis: Muscle weakness (generalized)  Quadriplegia, C5-C7 incomplete (HCC)  Other abnormalities of gait and mobility  Other disturbances of skin sensation  Other symptoms and signs involving the nervous system  Other lack of coordination  Abnormal posture    Problem List Patient Active Problem List   Diagnosis Date Noted   Quadriplegia, C5-C7 incomplete ([place]) 01/16/2021   History of spinal fracture 01/16/2021   Suprapubic catheter ([place]) 01/16/2021  Encounter for routine gynecological examination 09/28/2013   Onychomycosis 09/28/2013   Foot deformity, acquired 03/26/2012   Encounter for preventive health examination 12/25/2010   ROSACEA 08/25/2009   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 [place], [place], [address] Phone: [phone]   Fax:  [phone]  Name: [name] MRN: [id number] Date of Birth:  09/05/1950

## 2021-07-28 NOTE — Therapy (Signed)
[facility] [address] [place], [place], [address] Phone: [phone]   Fax:  [phone]  Physical Therapy Treatment  Patient Details  Name: [name] MRN: [id number] Date of Birth: 03/06/1951 Referring Provider (PT): Landis Gandy   Encounter Date: 07/28/2021   PT End of Session - 07/28/21 1530     Visit Number 48    Number of Visits 28    Date for PT Re-Evaluation 08/07/21    Authorization Type [employer]; 36 visits 12/5-2/24/23    Authorization - Visit Number 39    Authorization - Number of Visits 36    Progress Note Due on Visit 13    PT Start Time 7371    PT Stop Time 1530    PT Time Calculation (min) 45 min    Activity Tolerance Patient tolerated treatment well    Behavior During Therapy [facility] for tasks assessed/performed             Past Medical History:  Diagnosis Date   CERVICAL POLYP 03/11/2008   Qualifier: Diagnosis of  By: Regis Bill MD, Standley Brooking    Colon polyps 2005   on colonscopy Dr. Fuller Plan   Fibroid 2004   Per Dr. Ouida Sills   History of shingles    face and mouth   Hx of skin cancer, basal cell    Rosacea    Sciatica of left side 09/28/2013   Scoliosis    noted on mri done for back pain    Past Surgical History:  Procedure Laterality Date   BUNIONECTOMY      There were no vitals filed for this visit.   Subjective Assessment - 07/28/21 1531     Subjective Pt denies any new issues. Did SciFit 25 minutes.    Patient is accompained by: Family member   husband, [name]   Pertinent History Pt also takes Toviaz 47m daily. PMH: hyperlipidemia, scoliosis, sleep disorder    Patient Stated Goals Pt would like to be able to walk even if its with assistance. She also wants to be able to type and improve her ability to do ADLs to allow for more independence.    Currently in Pain? Yes    Pain Score 4     Pain Location Arm    Pain Orientation Right;Left    Pain Descriptors /  Indicators Aching    Pain Type Neuropathic pain    Pain Onset More than a month ago                               [facility] 07[facility]28/21 1531       Bed Mobility   Bed Mobility Rolling Right;Sit to Supine    Rolling Right Contact Guard/Touching assist   with leg loop to raise up leg and using momentum from arms to assist roll. Performed x 2.   Sit to Supine Moderate Assistance - Patient 50-74%   Pt used rocking and momentum to get LLE on mat CGA and min/mod assist to get RLE on mat.     Transfers   Transfers Lateral/Scoot Transfers    Lateral/Scoot Transfers 5: Supervision;4: MCopyDetails (indicate cue type and reason) powerchair to/from mat with pt repositioning feet along the way almost completely on own with only 1 slight assist to untuck left foot from under her. Pt utilized UE assist to move legs.  Therapeutic Activites    Therapeutic Activities Other Therapeutic Activities    Other Therapeutic Activities "C" walk right with using leg loop to unweight to get elbow under her CGA and walking forward then CGA/min assist to rock to long sit. Pt was cued to straighten left leg some to help with coming up. Performed x 2. Long sit moving legs off edge of mat with UE assist with just unweighting at heels then pulling on edge to get bottom closer to edge.      Neuro Re-ed    Neuro Re-ed Details  In long sit: coming down on left forearm and reaching for target in front with RUE and then returning to upright x 3 then performed with bringing left forearm back more to side to work on coming back to long sit with reaching forward down leg with RUE more and using ER at shoulder to assist. Performed with same on right side. Walking back on hands in long sit as far back as possible then performing tricep dips in this position 10 x 2 and walking back up each time.      Exercises   Exercises Other Exercises    Other Exercises   In long sit self hamstring stretch reaching towards feet 1 min x 3.                       PT Short Term Goals - 07/10/21 1105       PT SHORT TERM GOAL #1   Title Pt will consistently use standing frame as part of HEP, 2-3x/week to facilitate LE WB and trunk control    Baseline 07/10/21 Pt is doing standing frame 3 x/week for an hour. She has also obtained a SciFit and is doing 6 days/wk    Time 4    Period Weeks    Status Achieved    Target Date 07/12/21      PT SHORT TERM GOAL #2   Title Pt will be able to lateral scoot transfer without slideboard on level surfaces mod I except for assist to reposition feet.    Baseline 07/10/21 supervision for lateral scoot transfers without slideboard except to reposition feet    Time 4    Period Weeks    Status Partially Met    Target Date 07/12/21      PT SHORT TERM GOAL #3   Title Pt will be able to roll right consistently with help of left leg loop mod I.    Baseline continues to need Intermittent supervision/verbal cues; can perform MOD I 90% of the time    Time 4    Period Weeks    Status On-going    Target Date 07/12/21      PT SHORT TERM GOAL #4   Title Pt will transfer supine to long sit utilizing "C" walk technique to L and R with leg loop and min A consistently    Baseline mod-max A to L; min-mod A to R with leg loop. 07/10/21 "C" walk right with leg loop min assist, left mod assist    Time 4    Period Weeks    Status Partially Met    Target Date 07/12/21      PT SHORT TERM GOAL #5   Title Pt will be able to propel manual w/c 250' supervision for improved mobility and aerobic conditioning/strengthening.    Baseline 07/10/21 Pt has not been using manual chair due to cold outside but will start more  when warms up some. When last assessed in clinic performed 345' supervision/CGA on level surfaces    Time 4    Period Weeks    Status Partially Met    Target Date 07/12/21      PT SHORT TERM GOAL #6   Title Pt will  consistently perform sit > supine on flat mat with leg loop and supervision; will be able to transition long sit > short sit MOD I    Baseline 07/10/21 sit to supine with leg loop mod assist only to get RLE on to mat, able to get LLE up with leg loop on own. Long sit to short sit min assist to unweight feet to move off mat    Time 4    Period Weeks    Status On-going    Target Date 07/12/21               PT Long Term Goals - 05/14/21 1424       PT LONG TERM GOAL #1   Title Pt will be able to perform short sit to supine transfer utilizing leg loop CGA for improved mobility.    Baseline currently able to get left leg up with loop but max assist for right.    Time 12    Period Weeks    Status New    Target Date 08/07/21      PT LONG TERM GOAL #2   Title Pt will be able to perform lateral scoot transfer without slideboard CGA for improved mobility.    Baseline 05/14/21- Pt still uses slide board and requires PT assist to reposition feet.    Time 12    Period Weeks    Status New    Target Date 08/07/21      PT LONG TERM GOAL #3   Title Pt will be able to stand at counter x 2 min mod assist with PT blocking right leg for improved standing ability/ strength.    Baseline 05/14/21- pt uses standing frame at this time    Time 12    Period Weeks    Status Revised    Target Date 08/07/21      PT LONG TERM GOAL #4   Title Pt will be able to maintain long sit without UE support x 5 min for improved core stability and assist with ADLs.    Time 12    Period Weeks    Status New    Target Date 08/07/21      PT LONG TERM GOAL #5   Title Pt will report being able to perform 20 minutes of manual w/c propulsion around home for improved UE strength and mobility.    Baseline 05/14/21- pt has manual w/c but has yet to use it    Time 12    Period Weeks    Status On-going    Target Date 08/07/21      Additional Long Term Goals   Additional Long Term Goals Yes      PT LONG TERM GOAL #6    Title Pt will be able to perform squat/pivot transfer mod assist of caregiver for improved mobility to uneven surfaces.    Baseline unable    Time 12    Period Weeks    Status New    Target Date 08/07/21                   Plan - 07/28/21 1855     Clinical Impression Statement Pt  continues to show improved strength in bilateral UE with functional mobility. Worked on utilizing shoulder ER to assist with coming up from sidelying as well.    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[employer] officer Evolving/Moderate complexity    Rehab Potential Good    PT Frequency 3x / week   plus eval   PT Duration 12 weeks    PT Treatment/Interventions ADLs/Self Care Home Management;Electrical Stimulation;DME Instruction;Neuromuscular re-education;Manual techniques;Therapeutic exercise;Balance training;Therapeutic activities;Cryotherapy;Moist Heat;Functional mobility training;Stair training;Gait training;Patient/family education;Orthotic Fit/Training;Wheelchair mobility training;Dry needling;Passive range of motion;Vestibular    PT Next Visit Plan Trying to cycle treatments-arms, functional mobility, leg strengthening.  Continue slideboard manual w/c <> mat or powerchair and parts management on manual w/c.  Lateral transfer without slideboard powerchair to/from mat for level or downhill, bed mobility. Continue to work on functional strengthening for UE, prone position for scapular strengthening-try unweighting with sheet under chest to be able to walk side to side on forearms. Continue work on long sit with moving legs on mat, balance with less UE support. Utilizing "C" walk to come from sidelying to long sit.  At some point may try bioness to  try to get more quad activation with exercises and possibly with standing frame.    Consulted and Agree with Plan of Care Patient;Family member/caregiver    Family Member Consulted Caregiver - [name]             Patient will benefit from skilled therapeutic intervention in order to improve the following deficits and impairments:  Decreased balance, Decreased mobility, Decreased strength, Impaired sensation, Postural dysfunction, Impaired flexibility, Impaired UE functional use, Impaired tone, Decreased range of motion  Visit Diagnosis: Muscle weakness (generalized)  Quadriplegia, C5-C7 incomplete ([place])     Problem List Patient Active Problem List   Diagnosis Date Noted   Quadriplegia, C5-C7 incomplete ([place]) 01/16/2021   History of spinal fracture 01/16/2021   Suprapubic catheter ([place]) 01/16/2021   Encounter for routine gynecological examination 09/28/2013   Onychomycosis 09/28/2013   Foot deformity, acquired 03/26/2012   Encounter for preventive health examination 12/25/2010   ROSACEA 08/25/2009   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

## 2021-07-29 DIAGNOSIS — G8250 Quadriplegia, unspecified: Secondary | ICD-10-CM | POA: Diagnosis not present

## 2021-07-29 DIAGNOSIS — N3000 Acute cystitis without hematuria: Secondary | ICD-10-CM | POA: Diagnosis not present

## 2021-07-30 ENCOUNTER — Ambulatory Visit: Payer: Medicare PPO | Admitting: Occupational Therapy

## 2021-07-30 ENCOUNTER — Encounter: Payer: Self-pay | Admitting: Occupational Therapy

## 2021-07-30 ENCOUNTER — Other Ambulatory Visit: Payer: Self-pay

## 2021-07-30 ENCOUNTER — Ambulatory Visit: Payer: Medicare PPO

## 2021-07-30 DIAGNOSIS — R293 Abnormal posture: Secondary | ICD-10-CM | POA: Diagnosis not present

## 2021-07-30 DIAGNOSIS — R2689 Other abnormalities of gait and mobility: Secondary | ICD-10-CM | POA: Diagnosis not present

## 2021-07-30 DIAGNOSIS — R29818 Other symptoms and signs involving the nervous system: Secondary | ICD-10-CM | POA: Diagnosis not present

## 2021-07-30 DIAGNOSIS — M6281 Muscle weakness (generalized): Secondary | ICD-10-CM

## 2021-07-30 DIAGNOSIS — G8254 Quadriplegia, C5-C7 incomplete: Secondary | ICD-10-CM

## 2021-07-30 DIAGNOSIS — R208 Other disturbances of skin sensation: Secondary | ICD-10-CM | POA: Diagnosis not present

## 2021-07-30 DIAGNOSIS — R278 Other lack of coordination: Secondary | ICD-10-CM | POA: Diagnosis not present

## 2021-07-30 NOTE — Therapy (Signed)
[name] [address] [place], [place], [address] Phone: [phone]   Fax:  [phone]  Physical Therapy Treatment  Patient Details  Name: [name] MRN: [id number] Date of Birth: 12-13-1950 Referring Provider (PT): [name]   Encounter Date: 07/30/2021   PT End of Session - 07/30/21 1106     Visit Number 68    Number of Visits 24    Date for PT Re-Evaluation 08/07/21    Authorization Type [employer]; 36 visits 12/5-2/24/23    Authorization - Visit Number 21    Authorization - Number of Visits 36    Progress Note Due on Visit 68    PT Start Time 1105    PT Stop Time 1147    PT Time Calculation (min) 42 min    Equipment Utilized During Treatment Other (comment)   pt's grip gloves   Activity Tolerance Patient tolerated treatment well    Behavior During Therapy [name] for tasks assessed/performed             Past Medical History:  Diagnosis Date   CERVICAL POLYP 03/11/2008   Qualifier: Diagnosis of  By: Carmen Bill MD, [name]    Colon polyps 2005   on colonscopy [name]   Fibroid 2004   Per [name]   History of shingles    face and mouth   Hx of skin cancer, basal cell    Rosacea    Sciatica of left side 09/28/2013   Scoliosis    noted on mri done for back pain    Past Surgical History:  Procedure Laterality Date   BUNIONECTOMY      There were no vitals filed for this visit.   Subjective Assessment - 07/30/21 1106     Subjective Pt denies any new issues. Still doing 25 minutes on stepper in 5 minute intervals.    Patient is accompained by: Family member   husband, HDarnell [name]   Pertinent History Pt also takes Toviaz 37m daily. PMH: hyperlipidemia, scoliosis, sleep disorder    Patient Stated Goals Pt would like to be able to walk even if its with assistance. She also wants to be able to type and improve her ability to do ADLs to allow for more independence.    Currently  in Pain? Yes    Pain Score 4     Pain Location Arm    Pain Orientation Right;Left    Pain Descriptors / Indicators Aching    Pain Type Neuropathic pain    Pain Onset More than a month ago                               [facility]       Transfers   Transfers Lateral/Scoot Transfers    Lateral/Scoot Transfers 5: Supervision;4: Min assist    Lateral/Scoot Transfer Details (indicate cue type and reason) powerchair to/from mat with pt repositioning feet along the way almost completely on own with only 1 slight assist to untuck left foot from under her on way to mat. Pt utilized UE assist to move legs. With return PT assisted with foot placement more.      Therapeutic Activites    Therapeutic Activities Other Therapeutic Activities    Other Therapeutic Activities Short sit to supine with pt trying to kick LLE out to lift on mat and rocking min assist on LLE and  mod assist at RLE as it also extended some. Support under heels to move legs over once back. Then scooted hips over max assist. Supine to long sit holding PT arms to pull herself up. Long sit to short sit with PT supporting under heels and pt assisting to move legs with UE assist one at a time.      Neuro Re-ed    Neuro Re-ed Details  Sitting edge of mat with mat elevated so feet were unsupported: raising arm to shoulder height x 10 trying to maintain upright trunk, trunk rotation reach across body to PT hand x 10 each side then with PT hand at shoulder height x 10, leaning back and returning to upright x 10, left LAQ 10 x 2 with hands in arm for more trunk activation as well. Coming down on forearm to side and back up x 5 each side. More challenging coming up from left side. Close SBA/CGA in front of pt for safety with wedge behind just in case. In long sit: coming down on left forearm and bringing back some to work on returning to upright with trying to get some ER at shoulder to  help lock elbow more and reaching forward with RUE down leg to get a rocking motion going CGA/min assist x 5. Walking back on both hands in long sit with performing tricep dips in this position 10 x 2.                       PT Short Term Goals - 07/10/21 1105       PT SHORT TERM GOAL #1   Title Pt will consistently use standing frame as part of HEP, 2-3x/week to facilitate LE WB and trunk control    Baseline 07/10/21 Pt is doing standing frame 3 x/week for an hour. She has also obtained a SciFit and is doing 6 days/wk    Time 4    Period Weeks    Status Achieved    Target Date 07/12/21      PT SHORT TERM GOAL #2   Title Pt will be able to lateral scoot transfer without slideboard on [name] surfaces mod I except for assist to reposition feet.    Baseline 07/10/21 supervision for lateral scoot transfers without slideboard except to reposition feet    Time 4    Period Weeks    Status Partially Met    Target Date 07/12/21      PT SHORT TERM GOAL #3   Title Pt will be able to roll right consistently with help of left leg loop mod I.    Baseline continues to need Intermittent supervision/verbal cues; can perform MOD I 90% of the time    Time 4    Period Weeks    Status On-going    Target Date 07/12/21      PT SHORT TERM GOAL #4   Title Pt will transfer supine to long sit utilizing "C" walk technique to L and R with leg loop and min A consistently    Baseline mod-max A to L; min-mod A to R with leg loop. 07/10/21 "C" walk right with leg loop min assist, left mod assist    Time 4    Period Weeks    Status Partially Met    Target Date 07/12/21      PT SHORT TERM GOAL #5   Title Pt will be able to propel manual w/c 250' supervision for improved mobility and aerobic  conditioning/strengthening.    Baseline 07/10/21 Pt has not been using manual chair due to cold outside but will start more when warms up some. When last assessed in clinic performed 345' supervision/CGA on [name]  surfaces    Time 4    Period Weeks    Status Partially Met    Target Date 07/12/21      PT SHORT TERM GOAL #6   Title Pt will consistently perform sit > supine on flat mat with leg loop and supervision; will be able to transition long sit > short sit MOD I    Baseline 07/10/21 sit to supine with leg loop mod assist only to get RLE on to mat, able to get LLE up with leg loop on own. Long sit to short sit min assist to unweight feet to move off mat    Time 4    Period Weeks    Status On-going    Target Date 07/12/21               PT Long Term Goals - 05/14/21 1424       PT LONG TERM GOAL #1   Title Pt will be able to perform short sit to supine transfer utilizing leg loop CGA for improved mobility.    Baseline currently able to get left leg up with loop but max assist for right.    Time 12    Period Weeks    Status New    Target Date 08/07/21      PT LONG TERM GOAL #2   Title Pt will be able to perform lateral scoot transfer without slideboard CGA for improved mobility.    Baseline 05/14/21- Pt still uses slide board and requires PT assist to reposition feet.    Time 12    Period Weeks    Status New    Target Date 08/07/21      PT LONG TERM GOAL #3   Title Pt will be able to stand at counter x 2 min mod assist with PT blocking right leg for improved standing ability/ strength.    Baseline 05/14/21- pt uses standing frame at this time    Time 12    Period Weeks    Status Revised    Target Date 08/07/21      PT LONG TERM GOAL #4   Title Pt will be able to maintain long sit without UE support x 5 min for improved core stability and assist with ADLs.    Time 12    Period Weeks    Status New    Target Date 08/07/21      PT LONG TERM GOAL #5   Title Pt will report being able to perform 20 minutes of manual w/c propulsion around home for improved UE strength and mobility.    Baseline 05/14/21- pt has manual w/c but has yet to use it    Time 12    Period Weeks    Status  On-going    Target Date 08/07/21      Additional Long Term Goals   Additional Long Term Goals Yes      PT LONG TERM GOAL #6   Title Pt will be able to perform squat/pivot transfer mod assist of caregiver for improved mobility to uneven surfaces.    Baseline unable    Time 12    Period Weeks    Status New    Target Date 08/07/21  [name] - 07/30/21 1158     Clinical Impression Statement Worked more on sitting balance in short sit with feet unsupported today which increased challenge to try to get more core activation. Pt did well correctly not needing any physical assistance to regain balance.    Personal Factors and Comorbidities Comorbidity 2    Comorbidities scoliosis and sleep disorder    Examination-Activity Limitations Bed Mobility;Locomotion [name];Transfers;Stand;Bathing;Dressing    Examination-Participation Freight forwarder;Yard Work    [employer] officer Evolving/Moderate complexity    Rehab Potential Good    PT Frequency 3x / week   plus eval   PT Duration 12 weeks    PT Treatment/Interventions ADLs/Self Care Home Management;Electrical Stimulation;DME Instruction;Neuromuscular re-education;Manual techniques;Therapeutic exercise;Balance training;Therapeutic activities;Cryotherapy;Moist Heat;Functional mobility training;Stair training;Gait training;Patient/family education;Orthotic Fit/Training;Wheelchair mobility training;Dry needling;Passive range of motion;Vestibular    PT Next Visit [name] 10th visit progress note next visit to just do recert as due end of next week anyways. Trying to cycle treatments-arms, functional mobility, leg strengthening.  Continue slideboard manual w/c <> mat or powerchair and parts management on manual w/c.  Lateral transfer without slideboard powerchair to/from mat for [name] or downhill, bed mobility. Continue to work on functional strengthening for UE, prone position for scapular  strengthening-try unweighting with sheet under chest to be able to walk side to side on forearms. Continue work on long sit with moving legs on mat, balance with less UE support. Utilizing "C" walk to come from sidelying to long sit.  At some point may try bioness to try to get more quad activation with exercises and possibly with standing frame.    Consulted and Agree with [name] of Care Patient;Family member/caregiver    Family Member Consulted Caregiver - [name]             Patient will benefit from skilled therapeutic intervention in order to improve the following deficits and impairments:  Decreased balance, Decreased mobility, Decreased strength, Impaired sensation, Postural dysfunction, Impaired flexibility, Impaired UE functional use, Impaired tone, Decreased range of motion  Visit Diagnosis: Muscle weakness (generalized)  Quadriplegia, C5-C7 incomplete ([place])     Problem List Patient Active Problem List   Diagnosis Date Noted   Quadriplegia, C5-C7 incomplete ([place]) 01/16/2021   History of spinal fracture 01/16/2021   Suprapubic catheter ([place]) 01/16/2021   Encounter for routine gynecological examination 09/28/2013   Onychomycosis 09/28/2013   Foot deformity, acquired 03/26/2012   Encounter for preventive health examination 12/25/2010   ROSACEA 08/25/2009   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 [place], [place], [address] Phone: [phone]   Fax:  [phone]  Name: [name] MRN: [id number] Date of Birth: 03/06/1951

## 2021-07-30 NOTE — Therapy (Signed)
[facility] [address] [place], [place], [address] Phone: [phone]   Fax:  [phone]  Occupational Therapy Treatment  Patient Details  Name: [name] MRN: [id number] Date of Birth: [birth year] Referring Provider (OT): Ina Homes, MD   Encounter Date: 07/30/2021   OT End of Session - 07/30/21 1121     Visit Number 36    Number of Visits 48   +24 visits at renewal 06/16/21   Date for OT Re-Evaluation 09/08/21   add at renewal   Authorization Type [employer]    Authorization Time Period Auth Req'd - 25 visits 05/06/21 - 07/19/21    Authorization - Visit Number 29    Authorization - Number of Visits 41   16+25   Progress Note Due on Visit 57    OT Start Time 1018    OT Stop Time 1100    OT Time Calculation (min) 42 min    Activity Tolerance Patient tolerated treatment well    Behavior During Therapy [facility] for tasks assessed/performed             Past Medical History:  Diagnosis Date   CERVICAL POLYP 03/11/2008   Qualifier: Diagnosis of  By: Regis Bill MD, Standley Brooking    Colon polyps 2005   on colonscopy Dr. Fuller Plan   Fibroid 2004   Per Dr. Ouida Sills   History of shingles    face and mouth   Hx of skin cancer, basal cell    Rosacea    Sciatica of left side 09/28/2013   Scoliosis    noted on mri done for back pain    Past Surgical History:  Procedure Laterality Date   BUNIONECTOMY      There were no vitals filed for this visit.   Subjective Assessment - 07/30/21 1022     Subjective  "i gotta get myself arranged"    Patient is accompanied by: --   caregiver   Pertinent History hyperlipidemia, scoliosis, sleep disorder    Patient Stated Goals regain arm strength for rolling over in bed and fine motor coordination for increasing typing    Currently in Pain? Yes    Pain Score 4     Pain Location Arm    Pain Orientation Left;Right    Pain Descriptors / Indicators Aching    Pain  Type Neuropathic pain    Pain Onset More than a month ago    Pain Frequency Constant                          OT Treatments/Exercises (OP) - 07/30/21 0001       Elbow Exercises   Other elbow exercises x 15 reps row, bicep curls, tricep extension with RED theraband today.      Fine Motor Coordination (Hand/Wrist)   Manipulation of small objects sustained grasp and pinch with resistance clothespins. Pt used RUE for yellow and LUE for remaining resistance. Stacking 1/2 inch blocks with RUE x 5 each x 5 towers. Picked up 1/2 inch blocks and placed on elevated surface approx 20".                      OT Short Term Goals - 06/16/21 1559       OT SHORT TERM GOAL #1   Title Pt will be independent with HEP w CG assistance PRN    Time 4    Period Weeks  Status Achieved    Target Date 04/13/21      OT SHORT TERM GOAL #2   Title Pt will verbalize understanding and report independence with CG assistance with ues of modalities at home with good safety.    Baseline has paraffin and Estim unit    Time 4    Period Weeks    Status Achieved   pt has new caregiver she is training for paraffin - reviewed and demonstrated understanding of estim unit     OT SHORT TERM GOAL #3   Title Pt will increase Box and Blocks score with RUE to 30 blocks or greater    Baseline R 26 L 39    Time 4    Period Weeks    Status Deferred   R 22 blocks  04/16/21, 5 blocks RUE 05/21/21 d/t finger cramping, 15 blocks 06/16/21 - deferred to updated LTG     OT SHORT TERM GOAL #4   Title Pt will increase BUE tricep strength to 4/5 consistently for increasing ability to perform SB transfers with supervision/set up assistance only.    Baseline 3+/5 strength BUE    Time 4    Period Weeks    Status Achieved      OT SHORT TERM GOAL #5   Title Pt will increase coordination in LUE to completing 9 hole peg test in 70 seconds or less.    Baseline L 75.13s, R unable    Time 4    Period Weeks     Status Achieved   L 63.87s 04/16/21     OT SHORT TERM GOAL #6   Title Pt will verbalize understanding of adapted strategies and/or equipment for increasing indepenence with ADLs and IADLs (typing, bathing, cutting food, etc)    Baseline has U cuff    Time 4    Period Weeks    Status Achieved   has verbalized understanding of a lot of AE but continues to benefit from education              OT Long Term Goals - 07/24/21 1326       OT LONG TERM GOAL #1   Title Pt will be independence with any updated HEP    Time 12    Period Weeks    Status On-going   continuing to update in plan of care.   Target Date 09/08/21      OT LONG TERM GOAL #2   Title Pt will increase functional use of BUE evidenced by completing Box and Blocks with score of 30 or greater with RUE, 45 or greater with LUE.    Baseline R 26, L 39    Time 12    Period Weeks    Status Revised   15 blocks 06/16/21   Target Date 09/08/21      OT LONG TERM GOAL #3   Title PT will improve grip strength in BUE by increasing grip strength to 10 lbs or greater with RUE and 20 lbs or greater with LUE.    Baseline R 1.5, L 12.3    Time 12    Period Weeks    Status On-going   RUE 3.3 lbs, LUE 14.5 lbs     OT LONG TERM GOAL #4   Title Pt will improve isolated finger movements in order to increase skill towards simple typing with adapted strategies and equipment PRN.    Baseline isolated movement in LUE    Time 12    Period  Weeks    Status Achieved   pt is typing for job related tasks at this time     OT LONG TERM GOAL #5   Title Pt will improve 9 hole peg test in LUE to completing in 65 seconds or less in order to increase functional use and demonstrate ability to place 2 or more pegs with RUE.    Baseline L 75.13s, R unable    Time 12    Period Weeks    Status Partially Met   71.19s LUE 05/21/21, was able to place 2 pegs with RUE 05/21/21     OT LONG TERM GOAL #6   Title Pt will report completing UB and LB dressing with  decreased assistance consistently.    Baseline UB (reports able to do but not consistently doing), LB total A    Time 12    Period Weeks    Status On-going   has progressed with increased independence however continuing to progress and work towards increasing independence with therapeutic interventions.                  Plan - 07/30/21 1123     Clinical Impression Statement Pt motivated for BUE return strengthening, coordination and range of motion. Continues to progress with increased skills in BUE.    OT Occupational Profile and History Detailed Assessment- Review of Records and additional review of physical, cognitive, psychosocial history related to current functional performance    Occupational performance deficits (Please refer to evaluation for details): ADL's;IADL's;Leisure;Work;Rest and Sleep    Body Structure / Function / Physical Skills ADL;IADL;ROM;Strength;Decreased knowledge of use of DME;Dexterity;GMC;Pain;Tone;UE functional use;Body mechanics;Balance;Continence;FMC;Muscle spasms;Skin integrity;Flexibility;Mobility;Sensation;Improper spinal/pelvic alignment;Endurance    Rehab Potential Good    Clinical Decision Making Several treatment options, min-mod task modification necessary    Comorbidities Affecting Occupational Performance: May have comorbidities impacting occupational performance    Modification or Assistance to Complete Evaluation  Min-Moderate modification of tasks or assist with assess necessary to complete eval    OT Frequency 2x / week    OT Duration 12 weeks   @ renewal 06/16/21   OT Treatment/Interventions Self-care/ADL training;Moist Heat;Fluidtherapy;DME and/or AE instruction;Splinting;Therapeutic activities;Aquatic Therapy;Ultrasound;Therapeutic exercise;Cognitive remediation/compensation;Passive range of motion;Functional Mobility Training;Neuromuscular education;Electrical Stimulation;Paraffin;Manual Therapy;Patient/family education    Plan LB dressing  techniques, bed mobility, coordination BUE, fluido from edge of mat, hip external rotation ROM    Consulted and Agree with Plan of Care Patient;Family member/caregiver    Family Member Consulted spouse [name]             Patient will benefit from skilled therapeutic intervention in order to improve the following deficits and impairments:   Body Structure / Function / Physical Skills: ADL, IADL, ROM, Strength, Decreased knowledge of use of DME, Dexterity, GMC, Pain, Tone, UE functional use, Body mechanics, Balance, Continence, FMC, Muscle spasms, Skin integrity, Flexibility, Mobility, Sensation, Improper spinal/pelvic alignment, Endurance       Visit Diagnosis: Muscle weakness (generalized)  Other abnormalities of gait and mobility  Quadriplegia, C5-C7 incomplete ([facility])  Other disturbances of skin sensation  Other symptoms and signs involving the nervous system  Other lack of coordination    Problem List Patient Active Problem List   Diagnosis Date Noted   Quadriplegia, C5-C7 incomplete ([facility]) 01/16/2021   History of spinal fracture 01/16/2021   Suprapubic catheter ([facility]) 01/16/2021   Encounter for routine gynecological examination 09/28/2013   Onychomycosis 09/28/2013   Foot deformity, acquired 03/26/2012   Encounter for preventive health examination 12/25/2010  ROSACEA 08/25/2009   Disturbance in sleep behavior 03/11/2008   SKIN CANCER, HX OF 03/11/2008   DYSURIA, HX OF 03/11/2008   Hyperlipidemia 02/10/2007   CERVICALGIA 02/10/2007    Zachery Conch, OT 07/30/2021, 11:26 AM  [facility] [address] [place], [place], [address] Phone: [phone]   Fax:  [phone]  Name: Neasia Fleeman MRN: [id number] Date of Birth: [birth year]

## 2021-07-31 ENCOUNTER — Encounter: Payer: Medicare PPO | Admitting: Occupational Therapy

## 2021-08-03 ENCOUNTER — Other Ambulatory Visit: Payer: Self-pay | Admitting: Internal Medicine

## 2021-08-04 ENCOUNTER — Ambulatory Visit: Payer: Medicare PPO | Admitting: Occupational Therapy

## 2021-08-04 ENCOUNTER — Ambulatory Visit: Payer: Medicare PPO

## 2021-08-04 ENCOUNTER — Other Ambulatory Visit: Payer: Self-pay

## 2021-08-04 ENCOUNTER — Encounter: Payer: Self-pay | Admitting: Occupational Therapy

## 2021-08-04 DIAGNOSIS — G8254 Quadriplegia, C5-C7 incomplete: Secondary | ICD-10-CM

## 2021-08-04 DIAGNOSIS — R278 Other lack of coordination: Secondary | ICD-10-CM

## 2021-08-04 DIAGNOSIS — R208 Other disturbances of skin sensation: Secondary | ICD-10-CM

## 2021-08-04 DIAGNOSIS — M6281 Muscle weakness (generalized): Secondary | ICD-10-CM

## 2021-08-04 DIAGNOSIS — R293 Abnormal posture: Secondary | ICD-10-CM

## 2021-08-04 DIAGNOSIS — R29818 Other symptoms and signs involving the nervous system: Secondary | ICD-10-CM | POA: Diagnosis not present

## 2021-08-04 DIAGNOSIS — R2689 Other abnormalities of gait and mobility: Secondary | ICD-10-CM | POA: Diagnosis not present

## 2021-08-04 NOTE — Therapy (Signed)
[facility] [address] [place], [place], [address] Phone: [phone]   Fax:  [phone]  Physical Therapy Treatment/Recert/Progress note  Patient Details  Name: [name] MRN: [id number] Date of Birth: 01-23-1951 Referring Provider (PT): [name]    Progress Note  Reporting period 07/03/21 to 08/04/21  See Note below for Objective Data and Assessment of Progress/Goals  Encounter Date: 08/04/2021   PT End of Session - 08/04/21 1145     Visit Number 50    Number of Visits 42    Date for PT Re-Evaluation 10/30/21    Authorization Type [employer]; 36 visits 12/5-2/24/23    Authorization - Visit Number 18    Authorization - Number of Visits 36    Progress Note Due on Visit 31    PT Start Time 1145    PT Stop Time 1230    PT Time Calculation (min) 45 min    Equipment Utilized During Treatment Other (comment)   pt's grip gloves   Activity Tolerance Patient tolerated treatment well    Behavior During Therapy [facility] for tasks assessed/performed             Past Medical History:  Diagnosis Date   CERVICAL POLYP 03/11/2008   Qualifier: Diagnosis of  By: Regis Bill MD, Standley Brooking    Colon polyps 2005   on colonscopy Dr. Fuller Plan   Fibroid 2004   Per Dr. Ouida Sills   History of shingles    face and mouth   Hx of skin cancer, basal cell    Rosacea    Sciatica of left side 09/28/2013   Scoliosis    noted on mri done for back pain    Past Surgical History:  Procedure Laterality Date   BUNIONECTOMY      There were no vitals filed for this visit.   Subjective Assessment - 08/04/21 1145     Subjective Pt just finished OT. Denies any new issues. Pt would like to not have to prop with pillows in bed so she can reposition herself at night for pressure relief.    Patient is accompained by: Family member   husband, [name]   Pertinent History Pt also takes Toviaz 62m daily. PMH: hyperlipidemia,  scoliosis, sleep disorder    Patient Stated Goals Pt would like to be able to walk even if its with assistance. She also wants to be able to type and improve her ability to do ADLs to allow for more independence.    Currently in Pain? Yes    Pain Score 4     Pain Location Arm    Pain Orientation Right;Left    Pain Descriptors / Indicators Aching    Pain Onset More than a month ago    Pain Frequency Constant                [facility] PT Assessment - 08/04/21 1146       Assessment   Medical Diagnosis quadriplegia, C5-7 incomplete    Referring Provider (PT) GLandis [name]   Onset Date/Surgical Date 07/28/20                           [facility] Treatment/Exercise - 08/04/21 1146       Bed Mobility   Bed Mobility Rolling Right;Rolling Left;Sit to Supine    Rolling Right Supervision/verbal cueing   with leg loop to get left leg flexed and then used arms  for momentum x 2-4 times to get over   Rolling Left Set up assist   PT flexed up right leg prior to rolling. Pt utilized arms for momentum to roll.   Sit to Supine Moderate Assistance - Patient 50-74%   using leg loop on LLE with leaning back with momentum to get left leg up. PT assisted RLE on to mat. Performed x 3 reps     Transfers   Transfers Lateral/Scoot Transfers    Lateral/Scoot Transfers 5: Supervision;4: Min assist    Lateral/Scoot Transfer Details (indicate cue type and reason) mat to powerchair to the left with [name] scoot transfer. Pt needed assistance to reposition feet. Supervision otherwise with multiple scoots.      Therapeutic Activites    Therapeutic Activities Other Therapeutic Activities    Other Therapeutic Activities "C" walk right with using leg loop to unweight to get elbow under her CGA/min assist and walking forward then CGA/min assist to rock to long sit. Pt was cued to straighten left leg some to help with coming up. Then performed on right with mod assist to get left elbow under her  and min/mod assist to rock up to long sit with cues to reach down leg with right arm to come forward more. Performed coming down on left forearm to side and rocking back up to long sit CGA/SBA x 3 reps. Long sit transitioning to short sit with pt assisting her legs off edge of mat with UE and PT upweighting heels. Pt then scooted to edge of mat using hands to pull her forward on edge of mat.      Neuro Re-ed    Neuro Re-ed Details  Pt maintained long sit without UE support x 5 min.                     PT Education - 08/04/21 1324     Education Details Recert plan    Person(s) Educated Patient    Methods Explanation    Comprehension Verbalized understanding              PT Short Term Goals - 07/10/21 1105       PT SHORT TERM GOAL #1   Title Pt will consistently use standing frame as part of HEP, 2-3x/week to facilitate LE WB and trunk control    Baseline 07/10/21 Pt is doing standing frame 3 x/week for an hour. She has also obtained a SciFit and is doing 6 days/wk    Time 4    Period Weeks    Status Achieved    Target Date 07/12/21      PT SHORT TERM GOAL #2   Title Pt will be able to lateral scoot transfer without slideboard on [name] surfaces mod I except for assist to reposition feet.    Baseline 07/10/21 supervision for lateral scoot transfers without slideboard except to reposition feet    Time 4    Period Weeks    Status Partially Met    Target Date 07/12/21      PT SHORT TERM GOAL #3   Title Pt will be able to roll right consistently with help of left leg loop mod I.    Baseline continues to need Intermittent supervision/verbal cues; can perform MOD I 90% of the time    Time 4    Period Weeks    Status On-going    Target Date 07/12/21      PT SHORT TERM GOAL #4   Title  Pt will transfer supine to long sit utilizing "C" walk technique to L and R with leg loop and min A consistently    Baseline mod-max A to L; min-mod A to R with leg loop. 07/10/21 "C" walk  right with leg loop min assist, left mod assist    Time 4    Period Weeks    Status Partially Met    Target Date 07/12/21      PT SHORT TERM GOAL #5   Title Pt will be able to propel manual w/c 250' supervision for improved mobility and aerobic conditioning/strengthening.    Baseline 07/10/21 Pt has not been using manual chair due to cold outside but will start more when warms up some. When last assessed in clinic performed 345' supervision/CGA on [name] surfaces    Time 4    Period Weeks    Status Partially Met    Target Date 07/12/21      PT SHORT TERM GOAL #6   Title Pt will consistently perform sit > supine on flat mat with leg loop and supervision; will be able to transition long sit > short sit MOD I    Baseline 07/10/21 sit to supine with leg loop mod assist only to get RLE on to mat, able to get LLE up with leg loop on own. Long sit to short sit min assist to unweight feet to move off mat    Time 4    Period Weeks    Status On-going    Target Date 07/12/21               PT Long Term Goals - 08/04/21 1327       PT LONG TERM GOAL #1   Title Pt will be able to perform short sit to supine transfer utilizing leg loop CGA for improved mobility.    Baseline currently able to get left leg up with loop but max assist for right. 08/04/21 mod assist to get right leg on mat. Able to get LLE on mat with leg loop    Time 12    Period Weeks    Status On-going    Target Date 08/07/21      PT LONG TERM GOAL #2   Title Pt will be able to perform lateral scoot transfer without slideboard CGA for improved mobility.    Baseline 05/14/21- Pt still uses slide board and requires PT assist to reposition feet. 08/04/21 supervision other than to reposition feet occasionally    Time 12    Period Weeks    Status Partially Met    Target Date 08/07/21      PT LONG TERM GOAL #3   Title Pt will be able to stand at counter x 2 min mod assist with PT blocking right leg for improved standing ability/  strength.    Baseline 05/14/21- pt uses standing frame at this time. 08/04/21 Still uses standing frame    Time 12    Period Weeks    Status Not Met    Target Date 08/07/21      PT LONG TERM GOAL #4   Title Pt will be able to maintain long sit without UE support x 5 min for improved core stability and assist with ADLs.    Baseline 08/04/21 >5 min in long sit without UE support    Time 12    Period Weeks    Status Achieved    Target Date 08/07/21      PT LONG  TERM GOAL #5   Title Pt will report being able to perform 20 minutes of manual w/c propulsion around home for improved UE strength and mobility.    Baseline 05/14/21- pt has manual w/c but has yet to use it. 08/04/21 Has manual chair but not using much at home still. Plans to start soon when schedule opens up a bit    Time 12    Period Weeks    Status On-going    Target Date 08/07/21      PT LONG TERM GOAL #6   Title Pt will be able to perform squat/pivot transfer mod assist of caregiver for improved mobility to uneven surfaces.    Baseline unable    Time 12    Period Weeks    Status On-going    Target Date 08/07/21            Updated goals:   PT Short Term Goals - 08/04/21 1336       PT SHORT TERM GOAL #1   Title Pt will be able to reposition herself in bed at night to allow her to not have to have caregiver assist to place pillows throughout night.    Baseline 08/04/21 currently needs caregiver to place pillows.    Time 4    Period Weeks    Status New    Target Date 09/01/21      PT SHORT TERM GOAL #2   Title Pt will be able to perform slideboard transfer on [name] surfaces mod I consistently with minimal assist to reposition RLE.    Baseline 08/04/21 currently supervision and mod/max assist to reposition RLE and occasional min assist on left    Time 4    Period Weeks    Status New    Target Date 09/01/21      PT SHORT TERM GOAL #3   Title Pt will be able to roll right consistently with help of left leg loop  mod I.    Baseline continues to need Intermittent supervision/verbal cues; can perform MOD I 90% of the time    Time 4    Period Weeks    Status On-going    Target Date 09/01/21      PT SHORT TERM GOAL #4   Title Pt will transfer supine to long sit utilizing "C" walk technique to L and R with leg loop and min A consistently    Baseline mod-max A to L; min-mod A to R with leg loop. 07/10/21 "C" walk right with leg loop min assist, left mod assist    Time 4    Period Weeks    Status On-going    Target Date 09/01/21             PT Long Term Goals - 08/04/21 1341       PT LONG TERM GOAL #1   Title Pt will be able to perform short sit to supine transfer utilizing leg loop CGA for improved mobility. (LTGs due 10/30/21)    Baseline currently able to get left leg up with loop but max assist for right. 08/04/21 mod assist to get right leg on mat. Able to get LLE on mat with leg loop    Time 12    Period Weeks    Status On-going    Target Date 10/30/21      PT LONG TERM GOAL #2   Title Pt will be able to perform lateral scoot transfer without slideboard SBA for improved mobility moving  her own feet.    Baseline 05/14/21- Pt still uses slide board and requires PT assist to reposition feet. 08/04/21 supervision other than to reposition feet occasionally    Time 12    Period Weeks    Status Revised    Target Date 10/30/21      PT LONG TERM GOAL #3   Title Pt will be able to stand at counter x 2 min mod assist with PT blocking right leg for improved standing ability/ strength.    Baseline 05/14/21- pt uses standing frame at this time. 08/04/21 Still uses standing frame    Time 12    Period Weeks    Status On-going    Target Date 10/30/21      PT LONG TERM GOAL #4   Title Pt will be able to maintain short sit with feet not supported >5 min for improved sitting balance and core strength.    Time 12    Period Weeks    Status New    Target Date 10/30/21      PT LONG TERM GOAL #5   Title  Pt will report being able to perform 20 minutes of manual w/c propulsion around home for improved UE strength and mobility.    Baseline 05/14/21- pt has manual w/c but has yet to use it. 08/04/21 Has manual chair but not using much at home still. Plans to start soon when schedule opens up a bit    Time 12    Period Weeks    Status On-going    Target Date 10/30/21      Additional Long Term Goals   Additional Long Term Goals Yes      PT LONG TERM GOAL #6   Title Pt will be able to perform squat/pivot transfer mod assist of caregiver for improved mobility to uneven surfaces.    Baseline unable    Time 12    Period Weeks    Status On-going    Target Date 10/30/21      PT LONG TERM GOAL #7   Title Pt will be able to perform "C" walk supine to long sit supervision to right for improved mobility.    Time 12    Period Weeks    Status New    Target Date 10/30/21                  Plan - 08/04/21 1327     Clinical Impression Statement PT reassessed goals. Pt continues to show progress towards most all goals even though has not fully met most. She was able to achieve the long sit goal without UE support showing improving core stability and sitting balance. She is needing less assistance with all transfers and bed mobility overall. Has started using leg loop to help her more as well. She is performing more activities at home as has standing frame and new SciFit machine. Pt will continue to benefit from skilled PT to continue to progress towards goals and maximize function.    Personal Factors and Comorbidities Comorbidity 2    Comorbidities scoliosis and sleep disorder    Examination-Activity Limitations Bed Mobility;Locomotion [name];Transfers;Stand;Bathing;Dressing    Examination-Participation Freight forwarder;Yard Work    [employer] Evolving/Moderate complexity    Rehab Potential Good    PT Frequency 3x / week    PT Duration 12  weeks    PT Treatment/Interventions ADLs/Self Care Home Management;Electrical Stimulation;DME Instruction;Neuromuscular re-education;Manual techniques;Therapeutic exercise;Balance training;Therapeutic activities;Cryotherapy;Moist Heat;Functional mobility training;Stair training;Gait training;Patient/family  education;Orthotic Fit/Training;Wheelchair mobility training;Dry needling;Passive range of motion;Vestibular    PT Next Visit Plan PT did recert early. Trying to cycle treatments-arms, functional mobility, leg strengthening.  Continue slideboard manual w/c <> mat or powerchair and parts management on manual w/c.  Lateral transfer without slideboard powerchair to/from mat for [name] or downhill, bed mobility. Continue to work on functional strengthening for UE, prone position for scapular strengthening-try unweighting with sheet under chest to be able to walk side to side on forearms. Continue work on long sit with moving legs on mat, balance with less UE support. Utilizing "C" walk to come from sidelying to long sit.  At some point may try bioness to try to get more quad activation with exercises and possibly with standing frame.    Consulted and Agree with Plan of Care Patient;Family member/caregiver    Family Member Consulted Caregiver - [name]             Patient will benefit from skilled therapeutic intervention in order to improve the following deficits and impairments:  Decreased balance, Decreased mobility, Decreased strength, Impaired sensation, Postural dysfunction, Impaired flexibility, Impaired UE functional use, Impaired tone, Decreased range of motion  Visit Diagnosis: Muscle weakness (generalized)  Quadriplegia, C5-C7 incomplete (HCC)  Abnormal posture     Problem List Patient Active Problem List   Diagnosis Date Noted   Quadriplegia, C5-C7 incomplete ([place]) 01/16/2021   History of spinal fracture 01/16/2021   Suprapubic catheter ([place]) 01/16/2021   Encounter for routine  gynecological examination 09/28/2013   Onychomycosis 09/28/2013   Foot deformity, acquired 03/26/2012   Encounter for preventive health examination 12/25/2010   ROSACEA 08/25/2009   Disturbance in sleep behavior 03/11/2008   SKIN CANCER, HX OF 03/11/2008   DYSURIA, HX OF 03/11/2008   Hyperlipidemia 02/10/2007   CERVICALGIA 02/10/2007    [name], PT, DPT, NCS 08/04/2021, 1:34 PM  [facility] [address] [place], [place], [address] Phone: [phone]   Fax:  [phone]  Name: [name] MRN: [id number] Date of Birth: 1950/09/11

## 2021-08-04 NOTE — Therapy (Signed)
[facility] [address] [place], [place], [address] Phone: [phone]   Fax:  [phone]  Occupational Therapy Treatment  Patient Details  Name: [name] MRN: [id number] Date of Birth: 1950/12/23 Referring Provider (OT): Ina Homes, MD   Encounter Date: 08/04/2021   OT End of Session - 08/04/21 1217     Visit Number 37    Number of Visits 16    Date for OT Re-Evaluation 09/08/21    Authorization Type [employer]    Authorization Time Period Auth Req'd - 25 visits 05/06/21 - 07/19/21    Authorization - Visit Number 64    Authorization - Number of Visits 41    Progress Note Due on Visit 34    OT Start Time 1100    OT Stop Time 1145    OT Time Calculation (min) 45 min    Activity Tolerance Patient tolerated treatment well    Behavior During Therapy [facility] for tasks assessed/performed             Past Medical History:  Diagnosis Date   CERVICAL POLYP 03/11/2008   Qualifier: Diagnosis of  By: Regis Bill MD, Standley Brooking    Colon polyps 2005   on colonscopy Dr. Fuller Plan   Fibroid 2004   Per Dr. Ouida Sills   History of shingles    face and mouth   Hx of skin cancer, basal cell    Rosacea    Sciatica of left side 09/28/2013   Scoliosis    noted on mri done for back pain    Past Surgical History:  Procedure Laterality Date   BUNIONECTOMY      There were no vitals filed for this visit.   Subjective Assessment - 08/04/21 1208     Subjective  I don't dress my lower body - it's just too hard on my bed - too soft    Pertinent History hyperlipidemia, scoliosis, sleep disorder    Patient Stated Goals regain arm strength for rolling over in bed and fine motor coordination for increasing typing    Currently in Pain? Yes    Pain Score 4     Pain Location Arm    Pain Orientation Right;Left    Pain Descriptors / Indicators Aching    Pain Type Neuropathic pain    Pain Onset More than a month ago     Pain Frequency Constant                          OT Treatments/Exercises (OP) - 08/04/21 0001       ADLs   Functional Mobility Worked on transitioning from short sitting to supine, and supine to short sitting.  Patient able to manage left leg with thigh strap to transition downward.  Attempted hooking left arm under left knee to manage left leg to allow right thigh to have strap - but unable to use momentum sufficiently with this position.  Patient able to roll supine to sidelying with assitance only for helping her secure thigh strap (on left leg with left arm.)      Neurological Re-education Exercises   Other Exercises 1 Worked on dynamic sitting balance.  Patient has difficulty shifting weight in hips toward left.  Cueing and facilitation to rotate top of body over base to counterbalance while holding a dowel in both hands.  Worked in all directions - forward/backward/diagonals.  Used prolonged exhalation to assist with postural control during effort.  Also worked on fast response balance - in batting a ball with a dowel in short sitting.                      OT Short Term Goals - 08/04/21 1220       OT SHORT TERM GOAL #1   Title Pt will be independent with HEP w CG assistance PRN    Time 4    Period Weeks    Status Achieved    Target Date 04/13/21      OT SHORT TERM GOAL #2   Title Pt will verbalize understanding and report independence with CG assistance with ues of modalities at home with good safety.    Baseline has paraffin and Estim unit    Time 4    Period Weeks    Status Achieved   pt has new caregiver she is training for paraffin - reviewed and demonstrated understanding of estim unit     OT SHORT TERM GOAL #3   Title Pt will increase Box and Blocks score with RUE to 30 blocks or greater    Baseline R 26 L 39    Time 4    Period Weeks    Status Deferred   R 22 blocks  04/16/21, 5 blocks RUE 05/21/21 d/t finger cramping, 15 blocks 06/16/21 -  deferred to updated LTG     OT SHORT TERM GOAL #4   Title Pt will increase BUE tricep strength to 4/5 consistently for increasing ability to perform SB transfers with supervision/set up assistance only.    Baseline 3+/5 strength BUE    Time 4    Period Weeks    Status Achieved      OT SHORT TERM GOAL #5   Title Pt will increase coordination in LUE to completing 9 hole peg test in 70 seconds or less.    Baseline L 75.13s, R unable    Time 4    Period Weeks    Status Achieved   L 63.87s 04/16/21     OT SHORT TERM GOAL #6   Title Pt will verbalize understanding of adapted strategies and/or equipment for increasing indepenence with ADLs and IADLs (typing, bathing, cutting food, etc)    Baseline has U cuff    Time 4    Period Weeks    Status Achieved   has verbalized understanding of a lot of AE but continues to benefit from education              OT Long Term Goals - 08/04/21 1220       OT LONG TERM GOAL #1   Title Pt will be independence with any updated HEP    Time 12    Period Weeks    Status On-going   continuing to update in plan of care.   Target Date 09/08/21      OT LONG TERM GOAL #2   Title Pt will increase functional use of BUE evidenced by completing Box and Blocks with score of 30 or greater with RUE, 45 or greater with LUE.    Baseline R 26, L 39    Time 12    Period Weeks    Status Revised   15 blocks 06/16/21   Target Date 09/08/21      OT LONG TERM GOAL #3   Title PT will improve grip strength in BUE by increasing grip strength to 10 lbs or greater with RUE and 20 lbs or greater  with LUE.    Baseline R 1.5, L 12.3    Time 12    Period Weeks    Status On-going   RUE 3.3 lbs, LUE 14.5 lbs     OT LONG TERM GOAL #4   Title Pt will improve isolated finger movements in order to increase skill towards simple typing with adapted strategies and equipment PRN.    Baseline isolated movement in LUE    Time 12    Period Weeks    Status Achieved   pt is typing  for job related tasks at this time     [place] #5   Title Pt will improve 9 hole peg test in LUE to completing in 65 seconds or less in order to increase functional use and demonstrate ability to place 2 or more pegs with RUE.    Baseline L 75.13s, R unable    Time 12    Period Weeks    Status Partially Met   71.19s LUE 05/21/21, was able to place 2 pegs with RUE 05/21/21     OT LONG TERM GOAL #6   Title Pt will report completing UB and LB dressing with decreased assistance consistently.    Baseline UB (reports able to do but not consistently doing), LB total A    Time 12    Period Weeks    Status On-going   has progressed with increased independence however continuing to progress and work towards increasing independence with therapeutic interventions.                  Plan - 08/04/21 1218     Clinical Impression Statement Pt continues to show improvement with balance, activity tolerance, functional mobility, and arm strength.    OT Occupational Profile and History Detailed Assessment- Review of Records and additional review of physical, cognitive, psychosocial history related to current functional performance    Occupational performance deficits (Please refer to evaluation for details): ADL's;IADL's;Leisure;Work;Rest and Sleep    Body Structure / Function / Physical Skills ADL;IADL;ROM;Strength;Decreased knowledge of use of DME;Dexterity;GMC;Pain;Tone;UE functional use;Body mechanics;Balance;Continence;FMC;Muscle spasms;Skin integrity;Flexibility;Mobility;Sensation;Improper spinal/pelvic alignment;Endurance    Rehab Potential Good    Clinical Decision Making Several treatment options, min-mod task modification necessary    Comorbidities Affecting Occupational Performance: May have comorbidities impacting occupational performance    Modification or Assistance to Complete Evaluation  Min-Moderate modification of tasks or assist with assess necessary to complete eval    OT  Frequency 2x / week    OT Duration 12 weeks   @ renewal 06/16/21   OT Treatment/Interventions Self-care/ADL training;Moist Heat;Fluidtherapy;DME and/or AE instruction;Splinting;Therapeutic activities;Aquatic Therapy;Ultrasound;Therapeutic exercise;Cognitive remediation/compensation;Passive range of motion;Functional Mobility Training;Neuromuscular education;Electrical Stimulation;Paraffin;Manual Therapy;Patient/family education    Plan LB dressing techniques, bed mobility, coordination BUE, fluido from edge of mat, hip external rotation ROM    Consulted and Agree with Plan of Care Patient;Family member/caregiver    Family Member Consulted caregiver [name]             Patient will benefit from skilled therapeutic intervention in order to improve the following deficits and impairments:   Body Structure / Function / Physical Skills: ADL, IADL, ROM, Strength, Decreased knowledge of use of DME, Dexterity, GMC, Pain, Tone, UE functional use, Body mechanics, Balance, Continence, FMC, Muscle spasms, Skin integrity, Flexibility, Mobility, Sensation, Improper spinal/pelvic alignment, Endurance       Visit Diagnosis: Muscle weakness (generalized)  Quadriplegia, C5-C7 incomplete (HCC)  Other abnormalities of gait and mobility  Other disturbances of skin  sensation  Other symptoms and signs involving the nervous system  Other lack of coordination  Abnormal posture    Problem List Patient Active Problem List   Diagnosis Date Noted   Quadriplegia, C5-C7 incomplete ([place]) 01/16/2021   History of spinal fracture 01/16/2021   Suprapubic catheter ([place]) 01/16/2021   Encounter for routine gynecological examination 09/28/2013   Onychomycosis 09/28/2013   Foot deformity, acquired 03/26/2012   Encounter for preventive health examination 12/25/2010   ROSACEA 08/25/2009   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

## 2021-08-06 ENCOUNTER — Encounter: Payer: Self-pay | Admitting: Occupational Therapy

## 2021-08-06 ENCOUNTER — Ambulatory Visit: Payer: Medicare PPO

## 2021-08-06 ENCOUNTER — Other Ambulatory Visit: Payer: Self-pay

## 2021-08-06 ENCOUNTER — Ambulatory Visit: Payer: Medicare PPO | Admitting: Occupational Therapy

## 2021-08-06 DIAGNOSIS — R2689 Other abnormalities of gait and mobility: Secondary | ICD-10-CM | POA: Diagnosis not present

## 2021-08-06 DIAGNOSIS — R208 Other disturbances of skin sensation: Secondary | ICD-10-CM

## 2021-08-06 DIAGNOSIS — R293 Abnormal posture: Secondary | ICD-10-CM

## 2021-08-06 DIAGNOSIS — G8254 Quadriplegia, C5-C7 incomplete: Secondary | ICD-10-CM

## 2021-08-06 DIAGNOSIS — M6281 Muscle weakness (generalized): Secondary | ICD-10-CM | POA: Diagnosis not present

## 2021-08-06 DIAGNOSIS — R29818 Other symptoms and signs involving the nervous system: Secondary | ICD-10-CM

## 2021-08-06 DIAGNOSIS — R278 Other lack of coordination: Secondary | ICD-10-CM | POA: Diagnosis not present

## 2021-08-06 NOTE — Therapy (Signed)
[facility] [address] [place], [place], [address] Phone: [phone]   Fax:  [phone]  Physical Therapy Treatment  Patient Details  Name: [name] MRN: [id number] Date of Birth: [birth year] Referring Provider (PT): Landis Gandy   Encounter Date: 08/06/2021   PT End of Session - 08/06/21 1148     Visit Number 64    Number of Visits 50    Date for PT Re-Evaluation 10/30/21    Authorization Type [employer]; 36 visits 12/5-2/24/23    Authorization - Visit Number 23    Authorization - Number of Visits 36    Progress Note Due on Visit 40    PT Start Time 1147    PT Stop Time 1232    PT Time Calculation (min) 45 min    Equipment Utilized During Treatment Other (comment)   pt's grip gloves   Activity Tolerance Patient tolerated treatment well    Behavior During Therapy [facility] for tasks assessed/performed             Past Medical History:  Diagnosis Date   CERVICAL POLYP 03/11/2008   Qualifier: Diagnosis of  By: Regis Bill MD, Standley Brooking    Colon polyps 2005   on colonscopy Dr. Fuller Plan   Fibroid 2004   Per Dr. Ouida Sills   History of shingles    face and mouth   Hx of skin cancer, basal cell    Rosacea    Sciatica of left side 09/28/2013   Scoliosis    noted on mri done for back pain    Past Surgical History:  Procedure Laterality Date   BUNIONECTOMY      There were no vitals filed for this visit.   Subjective Assessment - 08/06/21 1148     Subjective Pt reports that she is tired after OT. Worked on arms and core.    Patient is accompained by: Family member   husband, [name]   Pertinent History Pt also takes Toviaz 4mg  daily. PMH: hyperlipidemia, scoliosis, sleep disorder    Patient Stated Goals Pt would like to be able to walk even if its with assistance. She also wants to be able to type and improve her ability to do ADLs to allow for more independence.    Currently in Pain? Yes     Pain Score 4     Pain Location Arm    Pain Orientation Right;Left    Pain Descriptors / Indicators Aching    Pain Type Neuropathic pain    Pain Onset More than a month ago    Pain Frequency Constant                               [facility] Adult PT Treatment/Exercise - 08/06/21 1149       Transfers   Transfers Lateral/Scoot Transfers    Lateral/Scoot Transfers 4: Min assist    Lateral/Scoot Transfer Details (indicate cue type and reason) mat to powerchair slightly uphill at end of session. Needed min assist to get legs on foot plate and just to get bottom on seat initially as arms fatigued.      Therapeutic Activites    Therapeutic Activities Other Therapeutic Activities    Other Therapeutic Activities Sit to supine swinging left leg up min assist at left leg and mod assist at right to get on mat. Scooting legs off mat from long sit to short sit with PT unweighting under  heels.      Exercises   Exercises Other Exercises    Other Exercises  Seated edge of mat: left SAQ 8x 2 with 3# ankle weight, right hamstring curl with YTB with PT bringing leg up in to partial extension and using band to try to facilitate hamstrings with some resistance with trace slight contraction noted 3 x 2. Stepping out to side and back x 8 with LLE. Then placed 2" step on left side and had pt kick leg out/abd to place on step x 3 reps with sliding to get back off step. Performed on right side with assisted foot on/off to step with left arm x 3. Left sidelying with powder board under right leg: right knee extension/flexion/right hip flexion/ext AAROM x 5 each. Pt with the most activation with hip extension to straighten leg back down. Muscle tapping to facilitate contractions. Supine with left foot bent up working on trying to activate left glut to lift pelvis to try to help initiate roll to right x 5 then x 3 with reaching across with left arm as well.                     PT Education -  08/06/21 1432     Education Details Discussed trying rolling in bed at home to determine what challenges are. Suggested maybe taking a video. Discussion on possible benefit of bed assist rail to help with roll especially at night.    Person(s) Educated Patient;Caregiver(s)    Methods Explanation    Comprehension Verbalized understanding              PT Short Term Goals - 08/04/21 1336       PT SHORT TERM GOAL #1   Title Pt will be able to reposition herself in bed at night to allow her to not have to have caregiver assist to place pillows throughout night.    Baseline 08/04/21 currently needs caregiver to place pillows.    Time 4    Period Weeks    Status New    Target Date 09/01/21      PT SHORT TERM GOAL #2   Title Pt will be able to perform slideboard transfer on level surfaces mod I consistently with minimal assist to reposition RLE.    Baseline 08/04/21 currently supervision and mod/max assist to reposition RLE and occasional min assist on left    Time 4    Period Weeks    Status New    Target Date 09/01/21      PT SHORT TERM GOAL #3   Title Pt will be able to roll right consistently with help of left leg loop mod I.    Baseline continues to need Intermittent supervision/verbal cues; can perform MOD I 90% of the time    Time 4    Period Weeks    Status On-going    Target Date 09/01/21      PT SHORT TERM GOAL #4   Title Pt will transfer supine to long sit utilizing "C" walk technique to L and R with leg loop and min A consistently    Baseline mod-max A to L; min-mod A to R with leg loop. 07/10/21 "C" walk right with leg loop min assist, left mod assist    Time 4    Period Weeks    Status On-going    Target Date 09/01/21               PT Long  Term Goals - 08/04/21 1341       PT LONG TERM GOAL #1   Title Pt will be able to perform short sit to supine transfer utilizing leg loop CGA for improved mobility. (LTGs due 10/30/21)    Baseline currently able to get  left leg up with loop but max assist for right. 08/04/21 mod assist to get right leg on mat. Able to get LLE on mat with leg loop    Time 12    Period Weeks    Status On-going    Target Date 10/30/21      PT LONG TERM GOAL #2   Title Pt will be able to perform lateral scoot transfer without slideboard SBA for improved mobility moving her own feet.    Baseline 05/14/21- Pt still uses slide board and requires PT assist to reposition feet. 08/04/21 supervision other than to reposition feet occasionally    Time 12    Period Weeks    Status Revised    Target Date 10/30/21      PT LONG TERM GOAL #3   Title Pt will be able to stand at counter x 2 min mod assist with PT blocking right leg for improved standing ability/ strength.    Baseline 05/14/21- pt uses standing frame at this time. 08/04/21 Still uses standing frame    Time 12    Period Weeks    Status On-going    Target Date 10/30/21      PT LONG TERM GOAL #4   Title Pt will be able to maintain short sit with feet not supported >5 min for improved sitting balance and core strength.    Time 12    Period Weeks    Status New    Target Date 10/30/21      PT LONG TERM GOAL #5   Title Pt will report being able to perform 20 minutes of manual w/c propulsion around home for improved UE strength and mobility.    Baseline 05/14/21- pt has manual w/c but has yet to use it. 08/04/21 Has manual chair but not using much at home still. Plans to start soon when schedule opens up a bit    Time 12    Period Weeks    Status On-going    Target Date 10/30/21      Additional Long Term Goals   Additional Long Term Goals Yes      PT LONG TERM GOAL #6   Title Pt will be able to perform squat/pivot transfer mod assist of caregiver for improved mobility to uneven surfaces.    Baseline unable    Time 12    Period Weeks    Status On-going    Target Date 10/30/21      PT LONG TERM GOAL #7   Title Pt will be able to perform "C" walk supine to long sit  supervision to right for improved mobility.    Time 12    Period Weeks    Status New    Target Date 10/30/21                   Plan - 08/06/21 1433     Clinical Impression Statement Focused on BLE strengthening today. Pt was able to ellicit more activation in LLE on powder board today especially with hip extension.    Personal Factors and Comorbidities Comorbidity 2    Comorbidities scoliosis and sleep disorder    Examination-Activity Limitations Bed Mobility;Locomotion Level;Transfers;Stand;Bathing;Dressing  Examination-Participation Restrictions Investment banker, operational;Yard Work    Merchant [employer] officer Evolving/Moderate complexity    Rehab Potential Good    PT Frequency 3x / week    PT Duration 12 weeks    PT Treatment/Interventions ADLs/Self Care Home Management;Electrical Stimulation;DME Instruction;Neuromuscular re-education;Manual techniques;Therapeutic exercise;Balance training;Therapeutic activities;Cryotherapy;Moist Heat;Functional mobility training;Stair training;Gait training;Patient/family education;Orthotic Fit/Training;Wheelchair mobility training;Dry needling;Passive range of motion;Vestibular    PT Next Visit Plan How did rolling in bed at home go? Submit for more [employer]. Trying to cycle treatments-arms, functional mobility, leg strengthening.  Continue slideboard manual w/c <> mat or powerchair and parts management on manual w/c.  Lateral transfer without slideboard powerchair to/from mat for level or downhill, bed mobility. Continue to work on functional strengthening for UE, prone position for scapular strengthening-try unweighting with sheet under chest to be able to walk side to side on forearms. Continue work on long sit with moving legs on mat, balance with less UE support. Utilizing "C" walk to come from sidelying to long sit.  At some point may try bioness to try to get more quad activation with exercises and possibly with  standing frame.    Consulted and Agree with Plan of Care Patient;Family member/caregiver    Family Member Consulted Caregiver - [name]             Patient will benefit from skilled therapeutic intervention in order to improve the following deficits and impairments:  Decreased balance, Decreased mobility, Decreased strength, Impaired sensation, Postural dysfunction, Impaired flexibility, Impaired UE functional use, Impaired tone, Decreased range of motion  Visit Diagnosis: Muscle weakness (generalized)  Quadriplegia, C5-C7 incomplete ([place])     Problem List Patient Active Problem List   Diagnosis Date Noted   Quadriplegia, C5-C7 incomplete ([place]) 01/16/2021   History of spinal fracture 01/16/2021   Suprapubic catheter ([place]) 01/16/2021   Encounter for routine gynecological examination 09/28/2013   Onychomycosis 09/28/2013   Foot deformity, acquired 03/26/2012   Encounter for preventive health examination 12/25/2010   ROSACEA 08/25/2009   Disturbance in sleep behavior 03/11/2008   SKIN CANCER, HX OF 03/11/2008   DYSURIA, HX OF 03/11/2008   Hyperlipidemia 02/10/2007   CERVICALGIA 02/10/2007    Electa Sniff, PT, DPT, NCS 08/06/2021, 2:34 PM  [facility] [address] [place], [place], [address] Phone: [phone]   Fax:  [phone]  Name: [name] MRN: [id number] Date of Birth: May 21, 1951

## 2021-08-06 NOTE — Therapy (Signed)
[facility] [address] [place], [place], [address] Phone: [phone]   Fax:  [phone]  Occupational Therapy Treatment  Patient Details  Name: [name] MRN: [id number] Date of Birth: January 15, 1951 Referring Provider (OT): Ina Homes, MD   Encounter Date: 08/06/2021   OT End of Session - 08/06/21 1357     Visit Number 38    Number of Visits 14    Date for OT Re-Evaluation 09/08/21    Authorization Type [employer]    Authorization Time Period Auth Req'd - 25 visits 05/06/21 - 07/19/21    Authorization - Visit Number 5    Authorization - Number of Visits 41    OT Start Time 1100    OT Stop Time 1145    OT Time Calculation (min) 45 min    Activity Tolerance Patient tolerated treatment well    Behavior During Therapy [facility] for tasks assessed/performed             Past Medical History:  Diagnosis Date   CERVICAL POLYP 03/11/2008   Qualifier: Diagnosis of  By: Regis Bill MD, Standley Brooking    Colon polyps 2005   on colonscopy Dr. Fuller Plan   Fibroid 2004   Per Dr. Ouida Sills   History of shingles    face and mouth   Hx of skin cancer, basal cell    Rosacea    Sciatica of left side 09/28/2013   Scoliosis    noted on mri done for back pain    Past Surgical History:  Procedure Laterality Date   BUNIONECTOMY      There were no vitals filed for this visit.   Subjective Assessment - 08/06/21 1347     Subjective  I was a little sore - muscle soreness after last session.    Patient is accompanied by: --   caregiver [name]   Pertinent History hyperlipidemia, scoliosis, sleep disorder    Patient Stated Goals regain arm strength for rolling over in bed and fine motor coordination for increasing typing    Currently in Pain? Yes    Pain Score 4     Pain Location Arm    Pain Orientation Right;Left    Pain Descriptors / Indicators Aching    Pain Type Neuropathic pain    Pain Onset More than a  month ago    Pain Frequency Constant    Aggravating Factors  nothing    Pain Relieving Factors nothing                          OT Treatments/Exercises (OP) - 08/06/21 0001       ADLs   Functional Mobility Continuing to work on independence with all aspects of wheelchair management and transfer.  Patient able to adjust her wheelchair away from mat table while seated on table.  Patient able to put up or bring down both arm rests.  Patient unable to unlock lateral supports to swing away - although may be ready to try periods without lateral support as core activation and control has significantly improved.      Shoulder Exercises: Seated   Other Seated Exercises Using universal gym - patient completed forward chest press x 15 reps bilaterally with 10 lb resistance, Lat pull down x 20 lb resistance x 15 reps, and upright row with 20 lb resistance x 15.  Patient used active hand grip assist for RUE for all exercise.  Discussed with patient  and caregiver that once routine was established for exercises, weights, reps - patient could come in early or stay later to work on [place].    Other Seated Exercises Seated with weighted dowel (2lb) bicep curl, shoulder flexion and flexion rotation x 10-15 reps.                      OT Short Term Goals - 08/06/21 1403       OT SHORT TERM GOAL #1   Title Pt will be independent with HEP w CG assistance PRN    Time 4    Period Weeks    Status Achieved    Target Date 04/13/21      OT SHORT TERM GOAL #2   Title Pt will verbalize understanding and report independence with CG assistance with ues of modalities at home with good safety.    Baseline has paraffin and Estim unit    Time 4    Period Weeks    Status Achieved   pt has new caregiver she is training for paraffin - reviewed and demonstrated understanding of estim unit     OT SHORT TERM GOAL #3   Title Pt will increase Box and Blocks score with RUE to 30 blocks or greater     Baseline R 26 L 39    Time 4    Period Weeks    Status Deferred   R 22 blocks  04/16/21, 5 blocks RUE 05/21/21 d/t finger cramping, 15 blocks 06/16/21 - deferred to updated LTG     OT SHORT TERM GOAL #4   Title Pt will increase BUE tricep strength to 4/5 consistently for increasing ability to perform SB transfers with supervision/set up assistance only.    Baseline 3+/5 strength BUE    Time 4    Period Weeks    Status Achieved      OT SHORT TERM GOAL #5   Title Pt will increase coordination in LUE to completing 9 hole peg test in 70 seconds or less.    Baseline L 75.13s, R unable    Time 4    Period Weeks    Status Achieved   L 63.87s 04/16/21     OT SHORT TERM GOAL #6   Title Pt will verbalize understanding of adapted strategies and/or equipment for increasing indepenence with ADLs and IADLs (typing, bathing, cutting food, etc)    Baseline has U cuff    Time 4    Period Weeks    Status Achieved   has verbalized understanding of a lot of AE but continues to benefit from education              OT Long Term Goals - 08/06/21 1403       OT LONG TERM GOAL #1   Title Pt will be independence with any updated HEP    Time 12    Period Weeks    Status On-going   continuing to update in plan of care.   Target Date 09/08/21      OT LONG TERM GOAL #2   Title Pt will increase functional use of BUE evidenced by completing Box and Blocks with score of 30 or greater with RUE, 45 or greater with LUE.    Baseline R 26, L 39    Time 12    Period Weeks    Status Revised   15 blocks 06/16/21   Target Date 09/08/21      OT LONG TERM  GOAL #3   Title PT will improve grip strength in BUE by increasing grip strength to 10 lbs or greater with RUE and 20 lbs or greater with LUE.    Baseline R 1.5, L 12.3    Time 12    Period Weeks    Status On-going   RUE 3.3 lbs, LUE 14.5 lbs     OT LONG TERM GOAL #4   Title Pt will improve isolated finger movements in order to increase skill towards  simple typing with adapted strategies and equipment PRN.    Baseline isolated movement in LUE    Time 12    Period Weeks    Status Achieved   pt is typing for job related tasks at this time     [place] #5   Title Pt will improve 9 hole peg test in LUE to completing in 65 seconds or less in order to increase functional use and demonstrate ability to place 2 or more pegs with RUE.    Baseline L 75.13s, R unable    Time 12    Period Weeks    Status Partially Met   71.19s LUE 05/21/21, was able to place 2 pegs with RUE 05/21/21     OT LONG TERM GOAL #6   Title Pt will report completing UB and LB dressing with decreased assistance consistently.    Baseline UB (reports able to do but not consistently doing), LB total A    Time 12    Period Weeks    Status On-going   has progressed with increased independence however continuing to progress and work towards increasing independence with therapeutic interventions.                  Plan - 08/06/21 1358     Clinical Impression Statement Pt shows steady improvement in all aspects of functional mobility and is motivated for continued functional improvement.    OT Occupational Profile and History Detailed Assessment- Review of Records and additional review of physical, cognitive, psychosocial history related to current functional performance    Occupational performance deficits (Please refer to evaluation for details): ADL's;IADL's;Leisure;Work;Rest and Sleep    Body Structure / Function / Physical Skills ADL;IADL;ROM;Strength;Decreased knowledge of use of DME;Dexterity;GMC;Pain;Tone;UE functional use;Body mechanics;Balance;Continence;FMC;Muscle spasms;Skin integrity;Flexibility;Mobility;Sensation;Improper spinal/pelvic alignment;Endurance    Rehab Potential Good    Clinical Decision Making Several treatment options, min-mod task modification necessary    Comorbidities Affecting Occupational Performance: May have comorbidities  impacting occupational performance    Modification or Assistance to Complete Evaluation  Min-Moderate modification of tasks or assist with assess necessary to complete eval    OT Frequency 2x / week    OT Duration 12 weeks   @ renewal 06/16/21   OT Treatment/Interventions Self-care/ADL training;Moist Heat;Fluidtherapy;DME and/or AE instruction;Splinting;Therapeutic activities;Aquatic Therapy;Ultrasound;Therapeutic exercise;Cognitive remediation/compensation;Passive range of motion;Functional Mobility Training;Neuromuscular education;Electrical Stimulation;Paraffin;Manual Therapy;Patient/family education    Plan LB dressing techniques, bed mobility, coordination BUE, fluido from edge of mat, hip external rotation ROM    Consulted and Agree with Plan of Care Patient;Family member/caregiver    Family Member Consulted caregiver [name]             Patient will benefit from skilled therapeutic intervention in order to improve the following deficits and impairments:   Body Structure / Function / Physical Skills: ADL, IADL, ROM, Strength, Decreased knowledge of use of DME, Dexterity, GMC, Pain, Tone, UE functional use, Body mechanics, Balance, Continence, FMC, Muscle spasms, Skin integrity, Flexibility, Mobility, Sensation, Improper  spinal/pelvic alignment, Endurance       Visit Diagnosis: Muscle weakness (generalized)  Quadriplegia, C5-C7 incomplete (HCC)  Abnormal posture  Other disturbances of skin sensation  Other symptoms and signs involving the nervous system  Other lack of coordination    Problem List Patient Active Problem List   Diagnosis Date Noted   Quadriplegia, C5-C7 incomplete ([place]) 01/16/2021   History of spinal fracture 01/16/2021   Suprapubic catheter ([place]) 01/16/2021   Encounter for routine gynecological examination 09/28/2013   Onychomycosis 09/28/2013   Foot deformity, acquired 03/26/2012   Encounter for preventive health examination 12/25/2010   ROSACEA  08/25/2009   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 [place], [place], [address] Phone: [phone]   Fax:  [phone]  Name: [name] MRN: [id number] Date of Birth: [birth year]

## 2021-08-07 ENCOUNTER — Ambulatory Visit: Payer: Medicare PPO

## 2021-08-07 DIAGNOSIS — R29818 Other symptoms and signs involving the nervous system: Secondary | ICD-10-CM | POA: Diagnosis not present

## 2021-08-07 DIAGNOSIS — M6281 Muscle weakness (generalized): Secondary | ICD-10-CM

## 2021-08-07 DIAGNOSIS — G8254 Quadriplegia, C5-C7 incomplete: Secondary | ICD-10-CM

## 2021-08-07 DIAGNOSIS — R2689 Other abnormalities of gait and mobility: Secondary | ICD-10-CM | POA: Diagnosis not present

## 2021-08-07 DIAGNOSIS — R293 Abnormal posture: Secondary | ICD-10-CM

## 2021-08-07 DIAGNOSIS — R208 Other disturbances of skin sensation: Secondary | ICD-10-CM | POA: Diagnosis not present

## 2021-08-07 DIAGNOSIS — R278 Other lack of coordination: Secondary | ICD-10-CM | POA: Diagnosis not present

## 2021-08-07 NOTE — Therapy (Signed)
[facility] [address] [place], [place], [address] Phone: [phone]   Fax:  [phone]  Physical Therapy Treatment  Patient Details  Name: [name] MRN: [id number] Date of Birth: 04/21/1951 Referring Provider (PT): Landis Gandy   Encounter Date: 08/07/2021   PT End of Session - 08/07/21 1011     Visit Number 77    Number of Visits 1    Date for PT Re-Evaluation 10/30/21    Authorization Type [employer] medicare; 36 visits 12/5-2/24/23    Authorization - Visit Number 24    Authorization - Number of Visits 36    Progress Note Due on Visit 22    PT Start Time 1010    PT Stop Time 1103    PT Time Calculation (min) 53 min    Equipment Utilized During Treatment Other (comment)   pt's grip gloves   Activity Tolerance Patient tolerated treatment well    Behavior During Therapy [facility] for tasks assessed/performed             Past Medical History:  Diagnosis Date   CERVICAL POLYP 03/11/2008   Qualifier: Diagnosis of  By: Regis Bill MD, Standley Brooking    Colon polyps 2005   on colonscopy Dr. Fuller Plan   Fibroid 2004   Per Dr. Ouida Sills   History of shingles    face and mouth   Hx of skin cancer, basal cell    Rosacea    Sciatica of left side 09/28/2013   Scoliosis    noted on mri done for back pain    Past Surgical History:  Procedure Laterality Date   BUNIONECTOMY      There were no vitals filed for this visit.   Subjective Assessment - 08/07/21 1012     Subjective Pt reports that she was sore in arms after yesterday and did take aleve. Legs did well. Has not had any spasms in 10-11 days. Using a theraspray.    Patient is accompained by: Family member   husband, Darrick Penna   Pertinent History Pt also takes Toviaz 4mg  daily. PMH: hyperlipidemia, scoliosis, sleep disorder    Patient Stated Goals Pt would like to be able to walk even if its with assistance. She also wants to be able to type and improve her  ability to do ADLs to allow for more independence.    Currently in Pain? Yes    Pain Score 4     Pain Location Arm    Pain Orientation Right;Left    Pain Descriptors / Indicators Aching    Pain Type Neuropathic pain    Pain Onset More than a month ago    Pain Frequency Constant                               [facility] Adult PT Treatment/Exercise - 08/07/21 1013       Bed Mobility   Bed Mobility Rolling Right    Rolling Right Supervision/verbal cueing;Contact Guard/Touching assist      Transfers   Transfers Lateral/Scoot Transfers    Lateral/Scoot Transfers 5: Supervision;4: Min guard;4: Min assist    Lateral/Scoot Transfer Details (indicate cue type and reason) powerchair to/from mat. supervision with transfer with multiple scoots. Pt able to get feet off foot plate going to mat and needed min assist to get RLE on plate with return to chair. Pt assisted with UEs to reposition feet.  Therapeutic Activites    Therapeutic Activities Other Therapeutic Activities    Other Therapeutic Activities Short sit to supine kicking LLE up min assist and then mod/max assist to get right leg on mat. Practice with rolling to right using arm of powerchair like bed assist rail reaching across with LUE to pull herself over with LLE flexed x 2 supervision. Then trialed having LLE straight to see if she could pull over like this and took multiple trials and CGA. Performed x 2. Pt going to try rolling in bed at home over weekend and let therapist know how it goes. "C" walk right with using leg loop to unweight to get elbow under her CGA/min assist and walking forward then CGA assist to rock to long sit. Pt was cued to straighten left leg some to help with coming up. Long sit to short sit edge of mat with PT unweighting under heels and pt pushing legs to right with UE assist. Then once heels off mat scooting forward with pushing with arms then grabbing to edge of mat.      Neuro Re-ed    Neuro  Re-ed Details  Short sit edge of mat with feet floating: maintaining balance with holding purple ball in front raising overhead x 10 then to each side x 10 for some trunk rotation to PT hand for target. Pt had occasional LOB forward when turning to the left requiring min assist to regain. D1 diagonals x 5 each direction with purple ball again with a couple LOB with going to left. PT in front close SBA/CGA for safety. Long sit raising purple ball overhead x 10 then to each side x 10 with PT hand for target with LOB x 2 when going to the right this time.                       PT Short Term Goals - 08/04/21 1336       PT SHORT TERM GOAL #1   Title Pt will be able to reposition herself in bed at night to allow her to not have to have caregiver assist to place pillows throughout night.    Baseline 08/04/21 currently needs caregiver to place pillows.    Time 4    Period Weeks    Status New    Target Date 09/01/21      PT SHORT TERM GOAL #2   Title Pt will be able to perform slideboard transfer on level surfaces mod I consistently with minimal assist to reposition RLE.    Baseline 08/04/21 currently supervision and mod/max assist to reposition RLE and occasional min assist on left    Time 4    Period Weeks    Status New    Target Date 09/01/21      PT SHORT TERM GOAL #3   Title Pt will be able to roll right consistently with help of left leg loop mod I.    Baseline continues to need Intermittent supervision/verbal cues; can perform MOD I 90% of the time    Time 4    Period Weeks    Status On-going    Target Date 09/01/21      PT SHORT TERM GOAL #4   Title Pt will transfer supine to long sit utilizing "C" walk technique to L and R with leg loop and min A consistently    Baseline mod-max A to L; min-mod A to R with leg loop. 07/10/21 "C" walk right with leg  loop min assist, left mod assist    Time 4    Period Weeks    Status On-going    Target Date 09/01/21                PT Long Term Goals - 08/04/21 1341       PT LONG TERM GOAL #1   Title Pt will be able to perform short sit to supine transfer utilizing leg loop CGA for improved mobility. (LTGs due 10/30/21)    Baseline currently able to get left leg up with loop but max assist for right. 08/04/21 mod assist to get right leg on mat. Able to get LLE on mat with leg loop    Time 12    Period Weeks    Status On-going    Target Date 10/30/21      PT LONG TERM GOAL #2   Title Pt will be able to perform lateral scoot transfer without slideboard SBA for improved mobility moving her own feet.    Baseline 05/14/21- Pt still uses slide board and requires PT assist to reposition feet. 08/04/21 supervision other than to reposition feet occasionally    Time 12    Period Weeks    Status Revised    Target Date 10/30/21      PT LONG TERM GOAL #3   Title Pt will be able to stand at counter x 2 min mod assist with PT blocking right leg for improved standing ability/ strength.    Baseline 05/14/21- pt uses standing frame at this time. 08/04/21 Still uses standing frame    Time 12    Period Weeks    Status On-going    Target Date 10/30/21      PT LONG TERM GOAL #4   Title Pt will be able to maintain short sit with feet not supported >5 min for improved sitting balance and core strength.    Time 12    Period Weeks    Status New    Target Date 10/30/21      PT LONG TERM GOAL #5   Title Pt will report being able to perform 20 minutes of manual w/c propulsion around home for improved UE strength and mobility.    Baseline 05/14/21- pt has manual w/c but has yet to use it. 08/04/21 Has manual chair but not using much at home still. Plans to start soon when schedule opens up a bit    Time 12    Period Weeks    Status On-going    Target Date 10/30/21      Additional Long Term Goals   Additional Long Term Goals Yes      PT LONG TERM GOAL #6   Title Pt will be able to perform squat/pivot transfer mod assist of  caregiver for improved mobility to uneven surfaces.    Baseline unable    Time 12    Period Weeks    Status On-going    Target Date 10/30/21      PT LONG TERM GOAL #7   Title Pt will be able to perform "C" walk supine to long sit supervision to right for improved mobility.    Time 12    Period Weeks    Status New    Target Date 10/30/21                   Plan - 08/07/21 1120     Clinical Impression Statement PT continued to focus more on  core stability with sitting short sit without BLE support and then in long sit. Pt most challenged when turning to left in short sit and turning to right in long sit. Pt able to perform "C" walk quicker today with overall less assistance. Going to have pt trial rolling to right in bed at home using simulated bed assist rail with arm of powerchair.    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[employer] officer Evolving/Moderate complexity    Rehab Potential Good    PT Frequency 3x / week    PT Duration 12 weeks    PT Treatment/Interventions ADLs/Self Care Home Management;Electrical Stimulation;DME Instruction;Neuromuscular re-education;Manual techniques;Therapeutic exercise;Balance training;Therapeutic activities;Cryotherapy;Moist Heat;Functional mobility training;Stair training;Gait training;Patient/family education;Orthotic Fit/Training;Wheelchair mobility training;Dry needling;Passive range of motion;Vestibular    PT Next Visit Plan I submitted for more [employer] auth last visit. How did rolling in bed at home go? Trying to cycle treatments-arms, functional mobility, leg strengthening.  Continue slideboard manual w/c <> mat or powerchair and parts management on manual w/c.  Lateral transfer  without slideboard powerchair to/from mat for level or downhill, bed mobility. Continue to work on functional strengthening for UE, prone position for scapular strengthening-try unweighting with sheet under chest to be able to walk side to side on forearms. Continue work on long sit with moving legs on mat, balance with less UE support. Utilizing "C" walk to come from sidelying to long sit.  At some point may try bioness to try to get more quad activation with exercises and possibly with standing frame.    Consulted and Agree with Plan of Care Patient;Family member/caregiver    Family Member Consulted Caregiver - [name]             Patient will benefit from skilled therapeutic intervention in order to improve the following deficits and impairments:  Decreased balance, Decreased mobility, Decreased strength, Impaired sensation, Postural dysfunction, Impaired flexibility, Impaired UE functional use, Impaired tone, Decreased range of motion  Visit Diagnosis: Muscle weakness (generalized)  Quadriplegia, C5-C7 incomplete (HCC)  Abnormal posture     Problem List Patient Active Problem List   Diagnosis Date Noted   Quadriplegia, C5-C7 incomplete ([place]) 01/16/2021   History of spinal fracture 01/16/2021   Suprapubic catheter ([place]) 01/16/2021   Encounter for routine gynecological examination 09/28/2013   Onychomycosis 09/28/2013   Foot deformity, acquired 03/26/2012   Encounter for preventive health examination 12/25/2010   ROSACEA 08/25/2009   Disturbance in sleep behavior 03/11/2008   SKIN CANCER, HX OF 03/11/2008   DYSURIA, HX OF 03/11/2008   Hyperlipidemia 02/10/2007   CERVICALGIA 02/10/2007    Electa Sniff, PT, DPT, NCS 08/07/2021, 11:23 AM  [facility] [address] [place], [place], [address] Phone: [phone]   Fax:  [phone]  Name: [name] MRN: [id number] Date of Birth: 24-Jun-1950

## 2021-08-10 ENCOUNTER — Encounter: Payer: Self-pay | Admitting: Occupational Therapy

## 2021-08-10 ENCOUNTER — Ambulatory Visit: Payer: Medicare PPO | Admitting: Occupational Therapy

## 2021-08-10 ENCOUNTER — Other Ambulatory Visit: Payer: Self-pay

## 2021-08-10 DIAGNOSIS — R293 Abnormal posture: Secondary | ICD-10-CM | POA: Diagnosis not present

## 2021-08-10 DIAGNOSIS — M6281 Muscle weakness (generalized): Secondary | ICD-10-CM | POA: Diagnosis not present

## 2021-08-10 DIAGNOSIS — R2689 Other abnormalities of gait and mobility: Secondary | ICD-10-CM | POA: Diagnosis not present

## 2021-08-10 DIAGNOSIS — R29818 Other symptoms and signs involving the nervous system: Secondary | ICD-10-CM

## 2021-08-10 DIAGNOSIS — R278 Other lack of coordination: Secondary | ICD-10-CM

## 2021-08-10 DIAGNOSIS — G8254 Quadriplegia, C5-C7 incomplete: Secondary | ICD-10-CM | POA: Diagnosis not present

## 2021-08-10 DIAGNOSIS — R208 Other disturbances of skin sensation: Secondary | ICD-10-CM

## 2021-08-10 NOTE — Therapy (Signed)
[facility] [address] [place], [place], [address] Phone: [phone]   Fax:  [phone]  Occupational Therapy Treatment  Patient Details  Name: [name] MRN: [id number] Date of Birth: 1951-01-24 Referring Provider (OT): Ina Homes, MD   Encounter Date: 08/10/2021   OT End of Session - 08/10/21 1223     Visit Number 30    Number of Visits 33    Date for OT Re-Evaluation 09/08/21    Authorization Type [employer]    Authorization Time Period Auth Req'd - 16 additional visits 07/19/21 - 09/08/21    Authorization - Visit Number 33    Authorization - Number of Visits 50    Progress Note Due on Visit 34    OT Start Time 1100    OT Stop Time 1145    OT Time Calculation (min) 45 min    Activity Tolerance Patient tolerated treatment well    Behavior During Therapy [facility] for tasks assessed/performed             Past Medical History:  Diagnosis Date   CERVICAL POLYP 03/11/2008   Qualifier: Diagnosis of  By: Regis Bill MD, Standley Brooking    Colon polyps 2005   on colonscopy Dr. Fuller Plan   Fibroid 2004   Per Dr. Ouida Sills   History of shingles    face and mouth   Hx of skin cancer, basal cell    Rosacea    Sciatica of left side 09/28/2013   Scoliosis    noted on mri done for back pain    Past Surgical History:  Procedure Laterality Date   BUNIONECTOMY      There were no vitals filed for this visit.   Subjective Assessment - 08/10/21 1222     Subjective  "have you heard the news? I'm going back to [facility] March 13"    Patient is accompanied by: --   caregiver [name]   Pertinent History hyperlipidemia, scoliosis, sleep disorder    Patient Stated Goals regain arm strength for rolling over in bed and fine motor coordination for increasing typing    Currently in Pain? Yes    Pain Score 4     Pain Location Arm    Pain Orientation Left;Right    Pain Descriptors / Indicators Aching    Pain Type  Neuropathic pain    Pain Onset More than a month ago    Pain Frequency Constant             Transfer pt able to manage all pieces and steps of transfer to mat with exceptions of laterals, adjusting mat height and unclipping phone mount. Pt was able to instruct therapist on steps needed but unable to manage those items d/t strength and/or distance.  Core Strength with lateral russian twists x 10 with 1.1 lb med ball, forward flexion x 6 reps with 1.1 med ball with mod difficulty with extension while seated edge of mat.   Coordination with grooved pegs LUE with increased time and mod difficulty. Pt unable to grasp pegs for removal with RUE today.                        OT Short Term Goals - 08/06/21 1403       OT SHORT TERM GOAL #1   Title Pt will be independent with HEP w CG assistance PRN    Time 4    Period Weeks    Status  Achieved    Target Date 04/13/21      OT SHORT TERM GOAL #2   Title Pt will verbalize understanding and report independence with CG assistance with ues of modalities at home with good safety.    Baseline has paraffin and Estim unit    Time 4    Period Weeks    Status Achieved   pt has new caregiver she is training for paraffin - reviewed and demonstrated understanding of estim unit     OT SHORT TERM GOAL #3   Title Pt will increase Box and Blocks score with RUE to 30 blocks or greater    Baseline R 26 L 39    Time 4    Period Weeks    Status Deferred   R 22 blocks  04/16/21, 5 blocks RUE 05/21/21 d/t finger cramping, 15 blocks 06/16/21 - deferred to updated LTG     OT SHORT TERM GOAL #4   Title Pt will increase BUE tricep strength to 4/5 consistently for increasing ability to perform SB transfers with supervision/set up assistance only.    Baseline 3+/5 strength BUE    Time 4    Period Weeks    Status Achieved      OT SHORT TERM GOAL #5   Title Pt will increase coordination in LUE to completing 9 hole peg test in 70 seconds or less.     Baseline L 75.13s, R unable    Time 4    Period Weeks    Status Achieved   L 63.87s 04/16/21     OT SHORT TERM GOAL #6   Title Pt will verbalize understanding of adapted strategies and/or equipment for increasing indepenence with ADLs and IADLs (typing, bathing, cutting food, etc)    Baseline has U cuff    Time 4    Period Weeks    Status Achieved   has verbalized understanding of a lot of AE but continues to benefit from education              OT Long Term Goals - 08/06/21 1403       OT LONG TERM GOAL #1   Title Pt will be independence with any updated HEP    Time 12    Period Weeks    Status On-going   continuing to update in plan of care.   Target Date 09/08/21      OT LONG TERM GOAL #2   Title Pt will increase functional use of BUE evidenced by completing Box and Blocks with score of 30 or greater with RUE, 45 or greater with LUE.    Baseline R 26, L 39    Time 12    Period Weeks    Status Revised   15 blocks 06/16/21   Target Date 09/08/21      OT LONG TERM GOAL #3   Title PT will improve grip strength in BUE by increasing grip strength to 10 lbs or greater with RUE and 20 lbs or greater with LUE.    Baseline R 1.5, L 12.3    Time 12    Period Weeks    Status On-going   RUE 3.3 lbs, LUE 14.5 lbs     OT LONG TERM GOAL #4   Title Pt will improve isolated finger movements in order to increase skill towards simple typing with adapted strategies and equipment PRN.    Baseline isolated movement in LUE    Time 12    Period Weeks  Status Achieved   pt is typing for job related tasks at this time     [place] #5   Title Pt will improve 9 hole peg test in LUE to completing in 65 seconds or less in order to increase functional use and demonstrate ability to place 2 or more pegs with RUE.    Baseline L 75.13s, R unable    Time 12    Period Weeks    Status Partially Met   71.19s LUE 05/21/21, was able to place 2 pegs with RUE 05/21/21     OT LONG TERM GOAL  #6   Title Pt will report completing UB and LB dressing with decreased assistance consistently.    Baseline UB (reports able to do but not consistently doing), LB total A    Time 12    Period Weeks    Status On-going   has progressed with increased independence however continuing to progress and work towards increasing independence with therapeutic interventions.                  Plan - 08/10/21 1225     Clinical Impression Statement Pt continues to improve with overall mobility and functional movements. Pt motivated for continued improvement.    OT Occupational Profile and History Detailed Assessment- Review of Records and additional review of physical, cognitive, psychosocial history related to current functional performance    Occupational performance deficits (Please refer to evaluation for details): ADL's;IADL's;Leisure;Work;Rest and Sleep    Body Structure / Function / Physical Skills ADL;IADL;ROM;Strength;Decreased knowledge of use of DME;Dexterity;GMC;Pain;Tone;UE functional use;Body mechanics;Balance;Continence;FMC;Muscle spasms;Skin integrity;Flexibility;Mobility;Sensation;Improper spinal/pelvic alignment;Endurance    Rehab Potential Good    Clinical Decision Making Several treatment options, min-mod task modification necessary    Comorbidities Affecting Occupational Performance: May have comorbidities impacting occupational performance    Modification or Assistance to Complete Evaluation  Min-Moderate modification of tasks or assist with assess necessary to complete eval    OT Frequency 2x / week    OT Duration 12 weeks   @ renewal 06/16/21   OT Treatment/Interventions Self-care/ADL training;Moist Heat;Fluidtherapy;DME and/or AE instruction;Splinting;Therapeutic activities;Aquatic Therapy;Ultrasound;Therapeutic exercise;Cognitive remediation/compensation;Passive range of motion;Functional Mobility Training;Neuromuscular education;Electrical Stimulation;Paraffin;Manual  Therapy;Patient/family education    Plan LB dressing techniques, bed mobility, coordination BUE, fluido from edge of mat, hip external rotation ROM    Consulted and Agree with Plan of Care Patient;Family member/caregiver    Family Member Consulted caregiver [name]             Patient will benefit from skilled therapeutic intervention in order to improve the following deficits and impairments:   Body Structure / Function / Physical Skills: ADL, IADL, ROM, Strength, Decreased knowledge of use of DME, Dexterity, GMC, Pain, Tone, UE functional use, Body mechanics, Balance, Continence, FMC, Muscle spasms, Skin integrity, Flexibility, Mobility, Sensation, Improper spinal/pelvic alignment, Endurance       Visit Diagnosis: Muscle weakness (generalized)  Quadriplegia, C5-C7 incomplete (HCC)  Abnormal posture  Other disturbances of skin sensation  Other symptoms and signs involving the nervous system  Other lack of coordination    Problem List Patient Active Problem List   Diagnosis Date Noted   Quadriplegia, C5-C7 incomplete ([place]) 01/16/2021   History of spinal fracture 01/16/2021   Suprapubic catheter ([place]) 01/16/2021   Encounter for routine gynecological examination 09/28/2013   Onychomycosis 09/28/2013   Foot deformity, acquired 03/26/2012   Encounter for preventive health examination 12/25/2010   ROSACEA 08/25/2009   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

## 2021-08-11 ENCOUNTER — Ambulatory Visit: Payer: Medicare PPO

## 2021-08-11 ENCOUNTER — Encounter: Payer: Medicare PPO | Admitting: Occupational Therapy

## 2021-08-13 ENCOUNTER — Encounter: Payer: Medicare PPO | Admitting: Occupational Therapy

## 2021-08-13 ENCOUNTER — Ambulatory Visit: Payer: Medicare PPO

## 2021-08-14 ENCOUNTER — Encounter: Payer: Self-pay | Admitting: Internal Medicine

## 2021-08-14 ENCOUNTER — Ambulatory Visit: Payer: Medicare PPO

## 2021-08-18 ENCOUNTER — Ambulatory Visit: Payer: Medicare PPO | Attending: Internal Medicine

## 2021-08-18 ENCOUNTER — Ambulatory Visit: Payer: Medicare PPO | Admitting: Podiatry

## 2021-08-18 ENCOUNTER — Ambulatory Visit: Payer: Medicare PPO | Admitting: Occupational Therapy

## 2021-08-18 ENCOUNTER — Encounter: Payer: Self-pay | Admitting: Occupational Therapy

## 2021-08-18 ENCOUNTER — Other Ambulatory Visit: Payer: Self-pay

## 2021-08-18 DIAGNOSIS — N319 Neuromuscular dysfunction of bladder, unspecified: Secondary | ICD-10-CM | POA: Diagnosis not present

## 2021-08-18 DIAGNOSIS — R208 Other disturbances of skin sensation: Secondary | ICD-10-CM

## 2021-08-18 DIAGNOSIS — M6281 Muscle weakness (generalized): Secondary | ICD-10-CM | POA: Insufficient documentation

## 2021-08-18 DIAGNOSIS — G8254 Quadriplegia, C5-C7 incomplete: Secondary | ICD-10-CM | POA: Diagnosis not present

## 2021-08-18 DIAGNOSIS — N3000 Acute cystitis without hematuria: Secondary | ICD-10-CM | POA: Diagnosis not present

## 2021-08-18 DIAGNOSIS — R29818 Other symptoms and signs involving the nervous system: Secondary | ICD-10-CM | POA: Insufficient documentation

## 2021-08-18 DIAGNOSIS — R278 Other lack of coordination: Secondary | ICD-10-CM

## 2021-08-18 DIAGNOSIS — R252 Cramp and spasm: Secondary | ICD-10-CM | POA: Diagnosis not present

## 2021-08-18 DIAGNOSIS — R293 Abnormal posture: Secondary | ICD-10-CM

## 2021-08-18 DIAGNOSIS — Z960 Presence of urogenital implants: Secondary | ICD-10-CM | POA: Diagnosis not present

## 2021-08-18 DIAGNOSIS — R2689 Other abnormalities of gait and mobility: Secondary | ICD-10-CM | POA: Insufficient documentation

## 2021-08-18 DIAGNOSIS — Z79899 Other long term (current) drug therapy: Secondary | ICD-10-CM | POA: Diagnosis not present

## 2021-08-18 DIAGNOSIS — N39498 Other specified urinary incontinence: Secondary | ICD-10-CM | POA: Diagnosis not present

## 2021-08-18 DIAGNOSIS — G890 Central pain syndrome: Secondary | ICD-10-CM | POA: Diagnosis not present

## 2021-08-18 NOTE — Therapy (Signed)
[facility] [address] [place], [place], [address] Phone: [phone]   Fax:  [phone]  Physical Therapy Treatment  Patient Details  Name: [name] MRN: [id number] Date of Birth: 1951-01-03 Referring Provider (PT): Landis Gandy   Encounter Date: 08/18/2021   PT End of Session - 08/18/21 1018     Visit Number 61    Number of Visits 40    Date for PT Re-Evaluation 10/30/21    Authorization Type [employer]; 36 visits 12/5-2/24/23    Authorization - Visit Number 24    Authorization - Number of Visits 36    Progress Note Due on Visit 46    PT Start Time 1016    PT Stop Time 1100    PT Time Calculation (min) 44 min    Equipment Utilized During Treatment Other (comment)   pt's grip gloves   Activity Tolerance Patient tolerated treatment well    Behavior During Therapy [name] for tasks assessed/performed             Past Medical History:  Diagnosis Date   CERVICAL POLYP 03/11/2008   Qualifier: Diagnosis of  By: Regis Bill MD, Standley Brooking    Colon polyps 2005   on colonscopy Dr. Fuller Plan   Fibroid 2004   Per Dr. Ouida Sills   History of shingles    face and mouth   Hx of skin cancer, basal cell    Rosacea    Sciatica of left side 09/28/2013   Scoliosis    noted on mri done for back pain    Past Surgical History:  Procedure Laterality Date   BUNIONECTOMY      There were no vitals filed for this visit.   Subjective Assessment - 08/18/21 1019     Subjective Pt reports that her conference went well. She is going to [facility] next week so will be stopping outpatient PT here this week. Tried rolling in bed at home and could not do with pulling on caregiver arm like she can here.    Patient is accompained by: Family member   husband, [name]   Pertinent History Pt also takes Toviaz '4mg'$  daily. PMH: hyperlipidemia, scoliosis, sleep disorder    Patient Stated Goals Pt would like to be able to walk  even if its with assistance. She also wants to be able to type and improve her ability to do ADLs to allow for more independence. Thinking she has another UTI and will be checking on that. She goes to physiatrist later today as well.    Currently in Pain? Yes    Pain Score 4     Pain Location Arm    Pain Orientation Right;Left    Pain Descriptors / Indicators Aching    Pain Type Neuropathic pain    Pain Onset More than a month ago    Pain Frequency Constant                               [facility] Adult PT Treatment/Exercise - 08/18/21 1026       Bed Mobility   Bed Mobility Rolling Right;Rolling Left;Sit to Supine    Rolling Right Set up assist   utilized arms for momentum   Rolling Left Set up assist;Contact Guard/Touching assist   PT assisted to flex right leg up prior to rolling.   Sit to Supine Moderate Assistance - Patient 50-74%   kicking LLE up  with min assist to get on mat and then max assist at RLE     Transfers   Transfers Lateral/Scoot Transfers    Lateral/Scoot Transfers 5: Supervision;4: Medical illustrator Details (indicate cue type and reason) Powerchair to mat supervision with PT assisting only briefly to reposition left foot from being tucked under her so she could move it off foot place.      Therapeutic Activites    Therapeutic Activities Other Therapeutic Activities    Other Therapeutic Activities "C" walk right with min assist to unweight to get elbow under her  and walking forward then CGA assist to rock to long sit. Pt was cued to straighten left leg some to help with coming up. Reverse to return back to sidelying then repeated. Performed on left side x 2 as well with mod/max assist to get left elbow under her than CGA/min assist to walk up. Verbal cues to come forward even more to allow trunk to stay forward when she goes to rock up which helped.      Exercises   Exercises Other Exercises    Other Exercises  Long sit: self  hamstring stretch 1 min x 3. Walking back on hands to limit where she can keep her balance then performed tricep dips 10 x 2 with rest break in between.                       PT Short Term Goals - 08/04/21 1336       PT SHORT TERM GOAL #1   Title Pt will be able to reposition herself in bed at night to allow her to not have to have caregiver assist to place pillows throughout night.    Baseline 08/04/21 currently needs caregiver to place pillows.    Time 4    Period Weeks    Status New    Target Date 09/01/21      PT SHORT TERM GOAL #2   Title Pt will be able to perform slideboard transfer on level surfaces mod I consistently with minimal assist to reposition RLE.    Baseline 08/04/21 currently supervision and mod/max assist to reposition RLE and occasional min assist on left    Time 4    Period Weeks    Status New    Target Date 09/01/21      PT SHORT TERM GOAL #3   Title Pt will be able to roll right consistently with help of left leg loop mod I.    Baseline continues to need Intermittent supervision/verbal cues; can perform MOD I 90% of the time    Time 4    Period Weeks    Status On-going    Target Date 09/01/21      PT SHORT TERM GOAL #4   Title Pt will transfer supine to long sit utilizing "C" walk technique to L and R with leg loop and min A consistently    Baseline mod-max A to L; min-mod A to R with leg loop. 07/10/21 "C" walk right with leg loop min assist, left mod assist    Time 4    Period Weeks    Status On-going    Target Date 09/01/21               PT Long Term Goals - 08/04/21 1341       PT LONG TERM GOAL #1   Title Pt will be able to perform short sit to  supine transfer utilizing leg loop CGA for improved mobility. (LTGs due 10/30/21)    Baseline currently able to get left leg up with loop but max assist for right. 08/04/21 mod assist to get right leg on mat. Able to get LLE on mat with leg loop    Time 12    Period Weeks    Status  On-going    Target Date 10/30/21      PT LONG TERM GOAL #2   Title Pt will be able to perform lateral scoot transfer without slideboard SBA for improved mobility moving her own feet.    Baseline 05/14/21- Pt still uses slide board and requires PT assist to reposition feet. 08/04/21 supervision other than to reposition feet occasionally    Time 12    Period Weeks    Status Revised    Target Date 10/30/21      PT LONG TERM GOAL #3   Title Pt will be able to stand at counter x 2 min mod assist with PT blocking right leg for improved standing ability/ strength.    Baseline 05/14/21- pt uses standing frame at this time. 08/04/21 Still uses standing frame    Time 12    Period Weeks    Status On-going    Target Date 10/30/21      PT LONG TERM GOAL #4   Title Pt will be able to maintain short sit with feet not supported >5 min for improved sitting balance and core strength.    Time 12    Period Weeks    Status New    Target Date 10/30/21      PT LONG TERM GOAL #5   Title Pt will report being able to perform 20 minutes of manual w/c propulsion around home for improved UE strength and mobility.    Baseline 05/14/21- pt has manual w/c but has yet to use it. 08/04/21 Has manual chair but not using much at home still. Plans to start soon when schedule opens up a bit    Time 12    Period Weeks    Status On-going    Target Date 10/30/21      Additional Long Term Goals   Additional Long Term Goals Yes      PT LONG TERM GOAL #6   Title Pt will be able to perform squat/pivot transfer mod assist of caregiver for improved mobility to uneven surfaces.    Baseline unable    Time 12    Period Weeks    Status On-going    Target Date 10/30/21      PT LONG TERM GOAL #7   Title Pt will be able to perform "C" walk supine to long sit supervision to right for improved mobility.    Time 12    Period Weeks    Status New    Target Date 10/30/21                   Plan - 08/18/21 1327      Clinical Impression Statement Pt was able to perform "C" walking with decreased time and assistance with rocking up to long sit to day. Continues to show progress with her functional mobility.    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[employer] officer Evolving/Moderate complexity    Rehab Potential Good  PT Frequency 3x / week    PT Duration 12 weeks    PT Treatment/Interventions ADLs/Self Care Home Management;Electrical Stimulation;DME Instruction;Neuromuscular re-education;Manual techniques;Therapeutic exercise;Balance training;Therapeutic activities;Cryotherapy;Moist Heat;Functional mobility training;Stair training;Gait training;Patient/family education;Orthotic Fit/Training;Wheelchair mobility training;Dry needling;Passive range of motion;Vestibular    PT Next Visit Plan Will discharge end of week to go to [facility]. Trying to cycle treatments-arms, functional mobility, leg strengthening.  Continue slideboard manual w/c <> mat or powerchair and parts management on manual w/c.  Lateral transfer without slideboard powerchair to/from mat for level or downhill, bed mobility. Continue to work on functional strengthening for UE, prone position for scapular strengthening-try unweighting with sheet under chest to be able to walk side to side on forearms. Continue work on long sit with moving legs on mat, balance with less UE support. Utilizing "C" walk to come from sidelying to long sit.  At some point may try bioness to try to get more quad activation with exercises and possibly with standing frame.    Consulted and Agree with Plan of Care Patient;Family member/caregiver    Family Member Consulted Caregiver - [name]             Patient will  benefit from skilled therapeutic intervention in order to improve the following deficits and impairments:  Decreased balance, Decreased mobility, Decreased strength, Impaired sensation, Postural dysfunction, Impaired flexibility, Impaired UE functional use, Impaired tone, Decreased range of motion  Visit Diagnosis: Muscle weakness (generalized)  Quadriplegia, C5-C7 incomplete ([place])     Problem List Patient Active Problem List   Diagnosis Date Noted   Quadriplegia, C5-C7 incomplete ([place]) 01/16/2021   History of spinal fracture 01/16/2021   Suprapubic catheter ([place]) 01/16/2021   Encounter for routine gynecological examination 09/28/2013   Onychomycosis 09/28/2013   Foot deformity, acquired 03/26/2012   Encounter for preventive health examination 12/25/2010   ROSACEA 08/25/2009   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

## 2021-08-18 NOTE — Therapy (Signed)
[facility] [address] [place], [place], [address] Phone: [phone]   Fax:  [phone]  Occupational Therapy Treatment  Patient Details  Name: [name] MRN: [id number] Date of Birth: 04-Feb-1951 Referring Provider (OT): Ina Homes, MD   Encounter Date: 08/18/2021   OT End of Session - 08/18/21 1103     Visit Number 40    Number of Visits 7    Date for OT Re-Evaluation 09/08/21    Authorization Type [employer]    Authorization Time Period Auth Req'd - 16 additional visits 07/19/21 - 09/08/21    Authorization - Visit Number 2    Authorization - Number of Visits 50    Progress Note Due on Visit 53    OT Start Time 1101    OT Stop Time 1145    OT Time Calculation (min) 44 min    Activity Tolerance Patient tolerated treatment well    Behavior During Therapy [facility] for tasks assessed/performed             Past Medical History:  Diagnosis Date   CERVICAL POLYP 03/11/2008   Qualifier: Diagnosis of  By: Regis Bill MD, Standley Brooking    Colon polyps 2005   on colonscopy Dr. Fuller Plan   Fibroid 2004   Per Dr. Ouida Sills   History of shingles    face and mouth   Hx of skin cancer, basal cell    Rosacea    Sciatica of left side 09/28/2013   Scoliosis    noted on mri done for back pain    Past Surgical History:  Procedure Laterality Date   BUNIONECTOMY      There were no vitals filed for this visit.   Subjective Assessment - 08/18/21 1101     Subjective  The conference was good    Patient is accompanied by: --   caregiver [name]   Pertinent History hyperlipidemia, scoliosis, sleep disorder    Patient Stated Goals regain arm strength for rolling over in bed and fine motor coordination for increasing typing    Currently in Pain? Yes    Pain Score 4     Pain Location Arm    Pain Orientation Right;Left    Pain Descriptors / Indicators Aching    Pain Type Neuropathic pain    Pain Onset More  than a month ago    Pain Frequency Constant                          OT Treatments/Exercises (OP) - 08/18/21 1126       Neurological Re-education Exercises   Other Exercises 1 long sitting on mat with reaching otwards BLE feet for prep for LB Dressing. Pt able to pull LLE foot towards knee and achieve calf before releasing and foot sliding back. Pt demonstrates good mobility on mat from forearm on left to sitting up and vice versa. Pt demonstrates ability to transition from long sitting to sitting edge of mat with increased time and min A.    Other Exercises 2 BUE yellow theraband exercises x 15 reps. shoulder flexion, row, shoulder extension, horizontal abduction, bicep curls - while seated edge of mat      Fine Motor Coordination (Hand/Wrist)   Fine Motor Coordination Small Pegboard    Small Pegboard copied pattern with LUE with min difficulty and drops. Pt completed with LUE and copied pattern (3 triangles blue, green and yellow) with 100% accuracy  OT Short Term Goals - 08/06/21 1403       OT SHORT TERM GOAL #1   Title Pt will be independent with HEP w CG assistance PRN    Time 4    Period Weeks    Status Achieved    Target Date 04/13/21      OT SHORT TERM GOAL #2   Title Pt will verbalize understanding and report independence with CG assistance with ues of modalities at home with good safety.    Baseline has paraffin and Estim unit    Time 4    Period Weeks    Status Achieved   pt has new caregiver she is training for paraffin - reviewed and demonstrated understanding of estim unit     OT SHORT TERM GOAL #3   Title Pt will increase Box and Blocks score with RUE to 30 blocks or greater    Baseline R 26 L 39    Time 4    Period Weeks    Status Deferred   R 22 blocks  04/16/21, 5 blocks RUE 05/21/21 d/t finger cramping, 15 blocks 06/16/21 - deferred to updated LTG     OT SHORT TERM GOAL #4   Title Pt will increase BUE tricep  strength to 4/5 consistently for increasing ability to perform SB transfers with supervision/set up assistance only.    Baseline 3+/5 strength BUE    Time 4    Period Weeks    Status Achieved      OT SHORT TERM GOAL #5   Title Pt will increase coordination in LUE to completing 9 hole peg test in 70 seconds or less.    Baseline L 75.13s, R unable    Time 4    Period Weeks    Status Achieved   L 63.87s 04/16/21     OT SHORT TERM GOAL #6   Title Pt will verbalize understanding of adapted strategies and/or equipment for increasing indepenence with ADLs and IADLs (typing, bathing, cutting food, etc)    Baseline has U cuff    Time 4    Period Weeks    Status Achieved   has verbalized understanding of a lot of AE but continues to benefit from education              OT Long Term Goals - 08/06/21 1403       OT LONG TERM GOAL #1   Title Pt will be independence with any updated HEP    Time 12    Period Weeks    Status On-going   continuing to update in plan of care.   Target Date 09/08/21      OT LONG TERM GOAL #2   Title Pt will increase functional use of BUE evidenced by completing Box and Blocks with score of 30 or greater with RUE, 45 or greater with LUE.    Baseline R 26, L 39    Time 12    Period Weeks    Status Revised   15 blocks 06/16/21   Target Date 09/08/21      OT LONG TERM GOAL #3   Title PT will improve grip strength in BUE by increasing grip strength to 10 lbs or greater with RUE and 20 lbs or greater with LUE.    Baseline R 1.5, L 12.3    Time 12    Period Weeks    Status On-going   RUE 3.3 lbs, LUE 14.5 lbs     OT  LONG TERM GOAL #4   Title Pt will improve isolated finger movements in order to increase skill towards simple typing with adapted strategies and equipment PRN.    Baseline isolated movement in LUE    Time 12    Period Weeks    Status Achieved   pt is typing for job related tasks at this time     [place] #5   Title Pt will improve 9  hole peg test in LUE to completing in 65 seconds or less in order to increase functional use and demonstrate ability to place 2 or more pegs with RUE.    Baseline L 75.13s, R unable    Time 12    Period Weeks    Status Partially Met   71.19s LUE 05/21/21, was able to place 2 pegs with RUE 05/21/21     OT LONG TERM GOAL #6   Title Pt will report completing UB and LB dressing with decreased assistance consistently.    Baseline UB (reports able to do but not consistently doing), LB total A    Time 12    Period Weeks    Status On-going   has progressed with increased independence however continuing to progress and work towards increasing independence with therapeutic interventions.                  Plan - 08/18/21 1132     Clinical Impression Statement Pt progressing towards goals - Pt leaving for day program next week and will finish up next session here in outpatient OT.    OT Occupational Profile and History Detailed Assessment- Review of Records and additional review of physical, cognitive, psychosocial history related to current functional performance    Occupational performance deficits (Please refer to evaluation for details): ADL's;IADL's;Leisure;Work;Rest and Sleep    Body Structure / Function / Physical Skills ADL;IADL;ROM;Strength;Decreased knowledge of use of DME;Dexterity;GMC;Pain;Tone;UE functional use;Body mechanics;Balance;Continence;FMC;Muscle spasms;Skin integrity;Flexibility;Mobility;Sensation;Improper spinal/pelvic alignment;Endurance    Rehab Potential Good    Clinical Decision Making Several treatment options, min-mod task modification necessary    Comorbidities Affecting Occupational Performance: May have comorbidities impacting occupational performance    Modification or Assistance to Complete Evaluation  Min-Moderate modification of tasks or assist with assess necessary to complete eval    OT Frequency 2x / week    OT Duration 12 weeks   @ renewal 06/16/21   OT  Treatment/Interventions Self-care/ADL training;Moist Heat;Fluidtherapy;DME and/or AE instruction;Splinting;Therapeutic activities;Aquatic Therapy;Ultrasound;Therapeutic exercise;Cognitive remediation/compensation;Passive range of motion;Functional Mobility Training;Neuromuscular education;Electrical Stimulation;Paraffin;Manual Therapy;Patient/family education    Plan check goals and discharge    Consulted and Agree with Plan of Care Patient;Family member/caregiver    Family Member Consulted caregiver [name]             Patient will benefit from skilled therapeutic intervention in order to improve the following deficits and impairments:   Body Structure / Function / Physical Skills: ADL, IADL, ROM, Strength, Decreased knowledge of use of DME, Dexterity, GMC, Pain, Tone, UE functional use, Body mechanics, Balance, Continence, FMC, Muscle spasms, Skin integrity, Flexibility, Mobility, Sensation, Improper spinal/pelvic alignment, Endurance       Visit Diagnosis: Muscle weakness (generalized)  Quadriplegia, C5-C7 incomplete (HCC)  Abnormal posture  Other disturbances of skin sensation  Other symptoms and signs involving the nervous system  Other lack of coordination    Problem List Patient Active Problem List   Diagnosis Date Noted   Quadriplegia, C5-C7 incomplete ([place]) 01/16/2021   History of spinal fracture 01/16/2021   Suprapubic  catheter ([place]) 01/16/2021   Encounter for routine gynecological examination 09/28/2013   Onychomycosis 09/28/2013   Foot deformity, acquired 03/26/2012   Encounter for preventive health examination 12/25/2010   ROSACEA 08/25/2009   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

## 2021-08-19 ENCOUNTER — Encounter: Payer: Self-pay | Admitting: Internal Medicine

## 2021-08-19 ENCOUNTER — Ambulatory Visit: Payer: Medicare PPO

## 2021-08-19 ENCOUNTER — Ambulatory Visit: Payer: Medicare PPO | Admitting: Occupational Therapy

## 2021-08-19 ENCOUNTER — Encounter: Payer: Self-pay | Admitting: Occupational Therapy

## 2021-08-19 DIAGNOSIS — G8254 Quadriplegia, C5-C7 incomplete: Secondary | ICD-10-CM

## 2021-08-19 DIAGNOSIS — R29818 Other symptoms and signs involving the nervous system: Secondary | ICD-10-CM | POA: Diagnosis not present

## 2021-08-19 DIAGNOSIS — R278 Other lack of coordination: Secondary | ICD-10-CM

## 2021-08-19 DIAGNOSIS — R208 Other disturbances of skin sensation: Secondary | ICD-10-CM | POA: Diagnosis not present

## 2021-08-19 DIAGNOSIS — M6281 Muscle weakness (generalized): Secondary | ICD-10-CM | POA: Diagnosis not present

## 2021-08-19 DIAGNOSIS — R293 Abnormal posture: Secondary | ICD-10-CM | POA: Diagnosis not present

## 2021-08-19 DIAGNOSIS — R2689 Other abnormalities of gait and mobility: Secondary | ICD-10-CM

## 2021-08-19 NOTE — Telephone Encounter (Signed)
Please do referral as patient request.

## 2021-08-19 NOTE — Therapy (Signed)
[facility] [address] [place], [place], [address] Phone: [phone]   Fax:  [phone]  Occupational Therapy Treatment & OT Discharge  Patient Details  Name: [name] MRN: [id number] Date of Birth: Jan 03, 1951 Referring Provider (OT): Ina Homes, MD   Encounter Date: 08/19/2021   OT End of Session - 08/19/21 1110     Visit Number 41    Number of Visits 54    Date for OT Re-Evaluation 09/08/21    Authorization Type [employer]    Authorization Time Period Auth Req'd - 16 additional visits 07/19/21 - 09/08/21    Authorization - Visit Number 67    Authorization - Number of Visits 50    Progress Note Due on Visit 9    OT Start Time 1015    OT Stop Time 1100    OT Time Calculation (min) 45 min    Activity Tolerance Patient tolerated treatment well    Behavior During Therapy [facility] for tasks assessed/performed             OCCUPATIONAL THERAPY DISCHARGE SUMMARY  Visits from Start of Care: 41  Current functional level related to goals / functional outcomes: Pt progressed with BUE coordination and strengthening and increased independence with ADLs.    Remaining deficits: Pt continues to remain with limited function below level of injury however continues to improve with overall functional mobility and activities.    Education / Equipment: HEPs for fine motor coordination, increased independence and BUE strengthening   Patient agrees to discharge. Patient goals were partially met. Patient is being discharged due to the patient's request..     Past Medical History:  Diagnosis Date   CERVICAL POLYP 03/11/2008   Qualifier: Diagnosis of  By: Regis Bill MD, Standley Brooking    Colon polyps 2005   on colonscopy Dr. Fuller Plan   Fibroid 2004   Per Dr. Ouida Sills   History of shingles    face and mouth   Hx of skin cancer, basal cell    Rosacea    Sciatica of left side 09/28/2013   Scoliosis    noted  on mri done for back pain    Past Surgical History:  Procedure Laterality Date   BUNIONECTOMY      There were no vitals filed for this visit.   Subjective Assessment - 08/19/21 1021     Subjective  Pain is the same - started antibiotic for UTI    Patient is accompanied by: --   caregiver [name]   Pertinent History hyperlipidemia, scoliosis, sleep disorder    Patient Stated Goals regain arm strength for rolling over in bed and fine motor coordination for increasing typing    Currently in Pain? Yes    Pain Score 4     Pain Location Arm    Pain Orientation Right;Left    Pain Descriptors / Indicators Aching    Pain Type Neuropathic pain    Pain Onset More than a month ago    Pain Frequency Constant                                    OT Short Term Goals - 08/06/21 1403       OT SHORT TERM GOAL #1   Title Pt will be independent with HEP w CG assistance PRN    Time 4    Period Weeks    Status  Achieved    Target Date 04/13/21      OT SHORT TERM GOAL #2   Title Pt will verbalize understanding and report independence with CG assistance with ues of modalities at home with good safety.    Baseline has paraffin and Estim unit    Time 4    Period Weeks    Status Achieved   pt has new caregiver she is training for paraffin - reviewed and demonstrated understanding of estim unit     OT SHORT TERM GOAL #3   Title Pt will increase Box and Blocks score with RUE to 30 blocks or greater    Baseline R 26 L 39    Time 4    Period Weeks    Status Deferred   R 22 blocks  04/16/21, 5 blocks RUE 05/21/21 d/t finger cramping, 15 blocks 06/16/21 - deferred to updated LTG     OT SHORT TERM GOAL #4   Title Pt will increase BUE tricep strength to 4/5 consistently for increasing ability to perform SB transfers with supervision/set up assistance only.    Baseline 3+/5 strength BUE    Time 4    Period Weeks    Status Achieved      OT SHORT TERM GOAL #5   Title Pt will  increase coordination in LUE to completing 9 hole peg test in 70 seconds or less.    Baseline L 75.13s, R unable    Time 4    Period Weeks    Status Achieved   L 63.87s 04/16/21     OT SHORT TERM GOAL #6   Title Pt will verbalize understanding of adapted strategies and/or equipment for increasing indepenence with ADLs and IADLs (typing, bathing, cutting food, etc)    Baseline has U cuff    Time 4    Period Weeks    Status Achieved   has verbalized understanding of a lot of AE but continues to benefit from education              OT Long Term Goals - 08/19/21 1024       OT LONG TERM GOAL #1   Title Pt will be independence with any updated HEP    Time 12    Period Weeks    Status Achieved   continuing to update in plan of care.   Target Date 09/08/21      OT LONG TERM GOAL #2   Title Pt will increase functional use of BUE evidenced by completing Box and Blocks with score of 30 or greater with RUE, 45 or greater with LUE.    Baseline R 26, L 39    Time 12    Period Weeks    Status Not Met   R 15 blocks 06/16/21, 19 blocks 08/19/21, L 41 08/19/21   Target Date 09/08/21      OT LONG TERM GOAL #3   Title PT will improve grip strength in BUE by increasing grip strength to 10 lbs or greater with RUE and 20 lbs or greater with LUE.    Baseline R 1.5, L 12.3    Time 12    Period Weeks    Status Not Met   RUE 3.9 lbs, LUE 17.4 lbs     OT LONG TERM GOAL #4   Title Pt will improve isolated finger movements in order to increase skill towards simple typing with adapted strategies and equipment PRN.    Baseline isolated movement in LUE  Time 12    Period Weeks    Status Achieved   pt is typing for job related tasks at this time     [place] #5   Title Pt will improve 9 hole peg test in LUE to completing in 65 seconds or less in order to increase functional use and demonstrate ability to place 2 or more pegs with RUE.    Baseline L 75.13s, R unable    Time 12    Period Weeks     Status Partially Met   71.19s LUE 05/21/21, was able to place 2 pegs with RUE 05/21/21    5 pegs in 90 seconds RUE, 76.52s on 08/19/21 LUE     OT LONG TERM GOAL #6   Title Pt will report completing UB and LB dressing with decreased assistance consistently.    Baseline UB (reports able to do but not consistently doing), LB total A    Time 12    Period Weeks    Status Achieved   has progressed towards increased independence with UB dressing and LB Dressing                  Plan - 08/19/21 1111     Clinical Impression Statement Pt is discharging from [facility] today d/t leaving to attend outpatient day program at [facility] in [place], [place]. Pt has progressed with all STGs and LTGs.    OT Occupational Profile and History Detailed Assessment- Review of Records and additional review of physical, cognitive, psychosocial history related to current functional performance    Occupational performance deficits (Please refer to evaluation for details): ADL's;IADL's;Leisure;Work;Rest and Sleep    Body Structure / Function / Physical Skills ADL;IADL;ROM;Strength;Decreased knowledge of use of DME;Dexterity;GMC;Pain;Tone;UE functional use;Body mechanics;Balance;Continence;FMC;Muscle spasms;Skin integrity;Flexibility;Mobility;Sensation;Improper spinal/pelvic alignment;Endurance    Rehab Potential Good    Clinical Decision Making Several treatment options, min-mod task modification necessary    Comorbidities Affecting Occupational Performance: May have comorbidities impacting occupational performance    Modification or Assistance to Complete Evaluation  Min-Moderate modification of tasks or assist with assess necessary to complete eval    OT Frequency 2x / week    OT Duration 12 weeks   @ renewal 06/16/21   OT Treatment/Interventions Self-care/ADL training;Moist Heat;Fluidtherapy;DME and/or AE instruction;Splinting;Therapeutic activities;Aquatic Therapy;Ultrasound;Therapeutic exercise;Cognitive  remediation/compensation;Passive range of motion;Functional Mobility Training;Neuromuscular education;Electrical Stimulation;Paraffin;Manual Therapy;Patient/family education    Plan discharge    Consulted and Agree with Plan of Care Patient;Family member/caregiver    Family Member Consulted caregiver [name]             Patient will benefit from skilled therapeutic intervention in order to improve the following deficits and impairments:   Body Structure / Function / Physical Skills: ADL, IADL, ROM, Strength, Decreased knowledge of use of DME, Dexterity, GMC, Pain, Tone, UE functional use, Body mechanics, Balance, Continence, FMC, Muscle spasms, Skin integrity, Flexibility, Mobility, Sensation, Improper spinal/pelvic alignment, Endurance       Visit Diagnosis: Muscle weakness (generalized)  Quadriplegia, C5-C7 incomplete (HCC)  Abnormal posture  Other disturbances of skin sensation  Other lack of coordination  Other abnormalities of gait and mobility  Other symptoms and signs involving the nervous system    Problem List Patient Active Problem List   Diagnosis Date Noted   Quadriplegia, C5-C7 incomplete ([place]) 01/16/2021   History of spinal fracture 01/16/2021   Suprapubic catheter ([place]) 01/16/2021   Encounter for routine gynecological examination 09/28/2013   Onychomycosis 09/28/2013   Foot deformity, acquired 03/26/2012  Encounter for preventive health examination 12/25/2010   ROSACEA 08/25/2009   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 [place], [place], [address] Phone: [phone]   Fax:  [phone]  Name: Namya Voges MRN: [id number] Date of Birth: 06/13/1951

## 2021-08-19 NOTE — Therapy (Signed)
[facility] [address] [place], [place], [address] Phone: [phone]   Fax:  [phone]  Physical Therapy Treatment  Patient Details  Name: [name] MRN: [id number] Date of Birth: 05-23-1951 Referring Provider (PT): Quincy Carnes   Encounter Date: 08/19/2021   PT End of Session - 08/19/21 0931     Visit Number 54    Number of Visits 88    Date for PT Re-Evaluation 10/30/21    Authorization Type humana medicare; 36 visits 2/27-5/19/23    Authorization - Visit Number 2    Authorization - Number of Visits 36    Progress Note Due on Visit 50    PT Start Time 0930    PT Stop Time 1016    PT Time Calculation (min) 46 min    Equipment Utilized During Treatment Other (comment)   pt's grip gloves   Activity Tolerance Patient tolerated treatment well    Behavior During Therapy [facility] for tasks assessed/performed             Past Medical History:  Diagnosis Date   CERVICAL POLYP 03/11/2008   Qualifier: Diagnosis of  By: [name] MD, [name]    Colon polyps 2005   on colonscopy [name]   Fibroid 2004   Per Dr. [name]   History of shingles    face and mouth   Hx of skin cancer, basal cell    Rosacea    Sciatica of left side 09/28/2013   Scoliosis    noted on mri done for back pain    Past Surgical History:  Procedure Laterality Date   BUNIONECTOMY      There were no vitals filed for this visit.   Subjective Assessment - 08/19/21 0931     Subjective Pt reports that she got her powerchair locking mechanism for car raised some this morning.    Patient is accompained by: Family member   husband, Valetta Mole   Pertinent History Pt also takes Toviaz 4mg  daily. PMH: hyperlipidemia, scoliosis, sleep disorder    Patient Stated Goals Pt would like to be able to walk even if its with assistance. She also wants to be able to type and improve her ability to do ADLs to allow for more independence. Thinking  she has another UTI and will be checking on that. She goes to physiatrist later today as well.    Currently in Pain? Yes    Pain Score 4     Pain Location Arm    Pain Orientation Right;Left    Pain Descriptors / Indicators Aching    Pain Type Neuropathic pain    Pain Onset More than a month ago    Pain Frequency Constant                               [facility] Adult PT Treatment/Exercise - 08/19/21 0933       Transfers   Transfers Lateral/Scoot Transfers    Lateral/Scoot Transfers 5: Supervision;4: Min assist    Lateral/Scoot Transfer Details (indicate cue type and reason) Powerchair to mat supervision. Pt was able to reposition left foot with some extra time to get from under her to lift off on own today. Used UE assist to move RLE off foot plate as well.      Therapeutic Activites    Therapeutic Activities Other Therapeutic Activities    Other Therapeutic Activities Transfered short sit to long  sit on mat kicking LLE up with min assist to fully get on mat and max assist at RLE.      Exercises   Exercises Other Exercises    Other Exercises  Seated edge of mat: left hip flexion through partial range 10 x 2 with slight lift of foot. Steppping in and out with LLE 10 x 2 with lifting foot fully. Then performed stepping out and back over yard stick x 10 then on to 2" step x 5 and off. RLE hamstring curl with yellow theraband to try to facilitate contraction with PT extending leg and slowly lowering with the contraction 5 x 2. Left LAQ 10 x 2 with 3# weight. Left ankle DF/PF rocking on BAPs board x 10, trialed on right with minimal PF contraction and only trace DF noted. Supine on mat: modified bridge over bolster x 10, full bridge through partial range with minimal lift x 10 with PT stabilizing RLE, hooklying hip abd/add x 10 on left with verbal cues to control movement then x 10 on right with PT blocking abd at about 30 degrees x 10 with more control with adduction noted.                        PT Short Term Goals - 08/04/21 1336       PT SHORT TERM GOAL #1   Title Pt will be able to reposition herself in bed at night to allow her to not have to have caregiver assist to place pillows throughout night.    Baseline 08/04/21 currently needs caregiver to place pillows.    Time 4    Period Weeks    Status New    Target Date 09/01/21      PT SHORT TERM GOAL #2   Title Pt will be able to perform slideboard transfer on level surfaces mod I consistently with minimal assist to reposition RLE.    Baseline 08/04/21 currently supervision and mod/max assist to reposition RLE and occasional min assist on left    Time 4    Period Weeks    Status New    Target Date 09/01/21      PT SHORT TERM GOAL #3   Title Pt will be able to roll right consistently with help of left leg loop mod I.    Baseline continues to need Intermittent supervision/verbal cues; can perform MOD I 90% of the time    Time 4    Period Weeks    Status On-going    Target Date 09/01/21      PT SHORT TERM GOAL #4   Title Pt will transfer supine to long sit utilizing "C" walk technique to L and R with leg loop and min A consistently    Baseline mod-max A to L; min-mod A to R with leg loop. 07/10/21 "C" walk right with leg loop min assist, left mod assist    Time 4    Period Weeks    Status On-going    Target Date 09/01/21               PT Long Term Goals - 08/04/21 1341       PT LONG TERM GOAL #1   Title Pt will be able to perform short sit to supine transfer utilizing leg loop CGA for improved mobility. (LTGs due 10/30/21)    Baseline currently able to get left leg up with loop but max assist for right. 08/04/21 mod assist  to get right leg on mat. Able to get LLE on mat with leg loop    Time 12    Period Weeks    Status On-going    Target Date 10/30/21      PT LONG TERM GOAL #2   Title Pt will be able to perform lateral scoot transfer without slideboard SBA for improved  mobility moving her own feet.    Baseline 05/14/21- Pt still uses slide board and requires PT assist to reposition feet. 08/04/21 supervision other than to reposition feet occasionally    Time 12    Period Weeks    Status Revised    Target Date 10/30/21      PT LONG TERM GOAL #3   Title Pt will be able to stand at counter x 2 min mod assist with PT blocking right leg for improved standing ability/ strength.    Baseline 05/14/21- pt uses standing frame at this time. 08/04/21 Still uses standing frame    Time 12    Period Weeks    Status On-going    Target Date 10/30/21      PT LONG TERM GOAL #4   Title Pt will be able to maintain short sit with feet not supported >5 min for improved sitting balance and core strength.    Time 12    Period Weeks    Status New    Target Date 10/30/21      PT LONG TERM GOAL #5   Title Pt will report being able to perform 20 minutes of manual w/c propulsion around home for improved UE strength and mobility.    Baseline 05/14/21- pt has manual w/c but has yet to use it. 08/04/21 Has manual chair but not using much at home still. Plans to start soon when schedule opens up a bit    Time 12    Period Weeks    Status On-going    Target Date 10/30/21      Additional Long Term Goals   Additional Long Term Goals Yes      PT LONG TERM GOAL #6   Title Pt will be able to perform squat/pivot transfer mod assist of caregiver for improved mobility to uneven surfaces.    Baseline unable    Time 12    Period Weeks    Status On-going    Target Date 10/30/21      PT LONG TERM GOAL #7   Title Pt will be able to perform "C" walk supine to long sit supervision to right for improved mobility.    Time 12    Period Weeks    Status New    Target Date 10/30/21                   Plan - 08/19/21 1207     Clinical Impression Statement Pt was able to demonstrate better left hip flexion today seated with clearing foot and able to clear on/off a 2" step.     Personal Factors and Comorbidities Comorbidity 2    Comorbidities scoliosis and sleep disorder    Examination-Activity Limitations Bed Mobility;Locomotion Level;Transfers;Stand;Bathing;Dressing    Examination-Participation Database administrator;Yard Work    Conservation officer, historic buildings Evolving/Moderate complexity    Rehab Potential Good    PT Frequency 3x / week    PT Duration 12 weeks    PT Treatment/Interventions ADLs/Self Care Home Management;Electrical Stimulation;DME Instruction;Neuromuscular re-education;Manual techniques;Therapeutic exercise;Balance training;Therapeutic activities;Cryotherapy;Moist Heat;Functional mobility training;Stair training;Gait training;Patient/family education;Orthotic Fit/Training;Wheelchair mobility training;Dry needling;Passive  range of motion;Vestibular    PT Next Visit Plan Discharge to go to [facility]. Check goals, UE strengthening/core day.    Consulted and Agree with Plan of Care Patient;Family member/caregiver    Family Member Consulted Caregiver - [name]             Patient will benefit from skilled therapeutic intervention in order to improve the following deficits and impairments:  Decreased balance, Decreased mobility, Decreased strength, Impaired sensation, Postural dysfunction, Impaired flexibility, Impaired UE functional use, Impaired tone, Decreased range of motion  Visit Diagnosis: Muscle weakness (generalized)  Quadriplegia, C5-C7 incomplete (HCC)     Problem List Patient Active Problem List   Diagnosis Date Noted   Quadriplegia, C5-C7 incomplete (HCC) 01/16/2021   History of spinal fracture 01/16/2021   Suprapubic catheter (HCC) 01/16/2021   Encounter for routine gynecological examination 09/28/2013   Onychomycosis 09/28/2013   Foot deformity, acquired 03/26/2012   Encounter for preventive health examination 12/25/2010   ROSACEA 08/25/2009   Disturbance in sleep behavior 03/11/2008   SKIN  CANCER, HX OF 03/11/2008   DYSURIA, HX OF 03/11/2008   Hyperlipidemia 02/10/2007   CERVICALGIA 02/10/2007    Ronn Melena, PT, DPT, NCS 08/19/2021, 12:11 PM  [facility] [address] [place], [place], [address] Phone: [phone]   Fax:  [phone]  Name: [name] MRN: [id number] Date of Birth: Jun 05, 1951

## 2021-08-20 ENCOUNTER — Ambulatory Visit: Payer: Medicare PPO | Admitting: Physical Therapy

## 2021-08-20 ENCOUNTER — Encounter: Payer: Medicare PPO | Admitting: Occupational Therapy

## 2021-08-21 ENCOUNTER — Ambulatory Visit: Payer: Medicare PPO

## 2021-08-21 ENCOUNTER — Other Ambulatory Visit: Payer: Self-pay

## 2021-08-21 DIAGNOSIS — M6281 Muscle weakness (generalized): Secondary | ICD-10-CM | POA: Diagnosis not present

## 2021-08-21 DIAGNOSIS — R208 Other disturbances of skin sensation: Secondary | ICD-10-CM | POA: Diagnosis not present

## 2021-08-21 DIAGNOSIS — R2689 Other abnormalities of gait and mobility: Secondary | ICD-10-CM | POA: Diagnosis not present

## 2021-08-21 DIAGNOSIS — R278 Other lack of coordination: Secondary | ICD-10-CM | POA: Diagnosis not present

## 2021-08-21 DIAGNOSIS — R29818 Other symptoms and signs involving the nervous system: Secondary | ICD-10-CM | POA: Diagnosis not present

## 2021-08-21 DIAGNOSIS — G8254 Quadriplegia, C5-C7 incomplete: Secondary | ICD-10-CM

## 2021-08-21 DIAGNOSIS — R293 Abnormal posture: Secondary | ICD-10-CM | POA: Diagnosis not present

## 2021-08-21 NOTE — Therapy (Signed)
[facility] [address] [place], [place], [address] Phone: [phone]   Fax:  [phone]  Physical Therapy Treatment/Discharge  Patient Details  Name: [name] MRN: [id number] Date of Birth: 1951/02/16 Referring Provider (PT): Landis Gandy  PHYSICAL THERAPY DISCHARGE SUMMARY  Visits from Start of Care: 55  Current functional level related to goals / functional outcomes: See clinical impression and goals for more information.   Remaining deficits: quadriplegia   Education / Equipment: HEP   Patient agrees to discharge. Patient goals were partially met. Patient is being discharged due to  Pt starting Day Program at [facility] for more intensive therapy bout.  Encounter Date: 08/21/2021   PT End of Session - 08/21/21 1018     Visit Number 55    Number of Visits 62    Date for PT Re-Evaluation 10/30/21    Authorization Type humana medicare; 36 visits 2/27-5/19/23    Authorization - Visit Number 3    Authorization - Number of Visits 36    Progress Note Due on Visit 70    PT Start Time 1016    PT Stop Time 1100    PT Time Calculation (min) 44 min    Equipment Utilized During Treatment Other (comment)   pt's grip gloves, clinic leg loops on LLE   Activity Tolerance Patient tolerated treatment well    Behavior During Therapy [facility] for tasks assessed/performed             Past Medical History:  Diagnosis Date   CERVICAL POLYP 03/11/2008   Qualifier: Diagnosis of  By: Regis Bill MD, Standley Brooking    Colon polyps 2005   on colonscopy Dr. Fuller Plan   Fibroid 2004   Per Dr. Ouida Sills   History of shingles    face and mouth   Hx of skin cancer, basal cell    Rosacea    Sciatica of left side 09/28/2013   Scoliosis    noted on mri done for back pain    Past Surgical History:  Procedure Laterality Date   BUNIONECTOMY      There were no vitals filed for this visit.   Subjective Assessment -  08/21/21 1019     Subjective Pt reports that she will have the same team when she goes to [place] on Monday.    Patient is accompained by: Family member   husband, [name]   Pertinent History Pt also takes Toviaz 71m daily. PMH: hyperlipidemia, scoliosis, sleep disorder    Patient Stated Goals Pt would like to be able to walk even if its with assistance. She also wants to be able to type and improve her ability to do ADLs to allow for more independence. Thinking she has another UTI and will be checking on that. She goes to physiatrist later today as well.    Currently in Pain? Yes    Pain Score 4     Pain Location Arm    Pain Orientation Right;Left    Pain Descriptors / Indicators Aching    Pain Type Neuropathic pain    Pain Onset More than a month ago    Pain Frequency Constant                               [facility] Adult PT Treatment/Exercise - 08/21/21 1020       Bed Mobility   Bed Mobility Rolling Right;Rolling Left;Right Sidelying to Sit;Left Sidelying  to Sit;Sitting - Scoot to Edge of Bed;Sit to Supine    Rolling Right Set up assist   multiple trials to roll with using arms for momentum and leg loop to pull LLE up in to flexion prior.   Rolling Left Set up assist;Contact Guard/Touching assist   PT positioned RLE in flexion prior to transfer and stabilized leg. Pt used arms for momentum   Right Sidelying to Sit Minimal Assistance - Patient > 75%   using "C" walk and leg loop to transfer to long sit. Pt unweighted to get elbow under her with leg loop. Able to walk up on own. Then straightened left leg prior to rocking up.   Left Sidelying to Sit Moderate Assistance - Patient 50-74%;Maximal Assistance - Patient 25-49%   using "C" walk to transfer to long sit. PT also unweighted pt to help her to come up on elbow and be able to walk forward.   Sitting - Scoot to Edge of Bed Contact Guard/Touching assist   Unweighted heels at times to help slide foot over then pt able to  scoot to edge on own.     Transfers   Transfers Lateral/Scoot Transfers    Lateral/Scoot Transfers 5: Supervision;4: Min guard;4: Min Passenger transport manager Details (indicate cue type and reason) Powerchair to mat going to right supervision throughout with pt readjusting her feet on her own with UE assist. With return to powerchair to left pt needed CGA and min assist to readjust feet.      Therapeutic Activites    Therapeutic Activities Other Therapeutic Activities    Other Therapeutic Activities In long sit: scooting legs to right to edge of mat then back to left using UE assist to help. PT unweighted under heels at times. In long sit: boosting up with tricep and scapular depression with hands on 2" step x 10 with 5 sec holds to slightly lift bottom. Then repeated with moving bottom to each side slightly with lift CGA. More movement with going to left. Trialed with hands on mat and unable to move much but did get correct movement with rotation to try to initiate.                     PT Education - 08/21/21 1150     Education Details Plan to discharge at this time as pt returning to [place].    Person(s) Educated Patient;Caregiver(s)    Methods Explanation    Comprehension Verbalized understanding              PT Short Term Goals - 08/21/21 1150       PT SHORT TERM GOAL #1   Title Pt will be able to reposition herself in bed at night to allow her to not have to have caregiver assist to place pillows throughout night.    Baseline 08/04/21 currently needs caregiver to place pillows. 08/21/21 Pt has trialed using rail to turn at night to help reposition in bed and was unable to roll over on own    Time 4    Period Weeks    Status Not Met    Target Date 09/01/21      PT SHORT TERM GOAL #2   Title Pt will be able to perform slideboard transfer on level surfaces mod I consistently with minimal assist to reposition RLE.    Baseline 08/04/21 currently supervision  and mod/max assist to reposition RLE and occasional min assist on left. 08/21/21 Pt is  supervision with with lateral scoot transfer without slideboard with min assist at times to reposition feet.    Time 4    Period Weeks    Status Partially Met    Target Date 09/01/21      PT SHORT TERM GOAL #3   Title Pt will be able to roll right consistently with help of left leg loop mod I.    Baseline continues to need Intermittent supervision/verbal cues; can perform MOD I 90% of the time. 08/21/21 supervision with multiple trials depending on day with leg loop    Time 4    Period Weeks    Status Partially Met    Target Date 09/01/21      PT SHORT TERM GOAL #4   Title Pt will transfer supine to long sit utilizing "C" walk technique to L and R with leg loop and min A consistently    Baseline mod-max A to L; min-mod A to R with leg loop. 07/10/21 "C" walk right with leg loop min assist, left mod assist. 08/21/21 "C" walk on right min assist, on left mod/max assist. Has met on right side    Time 4    Period Weeks    Status Partially Met    Target Date 09/01/21               PT Long Term Goals - 08/04/21 1341       PT LONG TERM GOAL #1   Title Pt will be able to perform short sit to supine transfer utilizing leg loop CGA for improved mobility. (LTGs due 10/30/21)    Baseline currently able to get left leg up with loop but max assist for right. 08/04/21 mod assist to get right leg on mat. Able to get LLE on mat with leg loop    Time 12    Period Weeks    Status On-going    Target Date 10/30/21      PT LONG TERM GOAL #2   Title Pt will be able to perform lateral scoot transfer without slideboard SBA for improved mobility moving her own feet.    Baseline 05/14/21- Pt still uses slide board and requires PT assist to reposition feet. 08/04/21 supervision other than to reposition feet occasionally    Time 12    Period Weeks    Status Revised    Target Date 10/30/21      PT LONG TERM GOAL #3    Title Pt will be able to stand at counter x 2 min mod assist with PT blocking right leg for improved standing ability/ strength.    Baseline 05/14/21- pt uses standing frame at this time. 08/04/21 Still uses standing frame    Time 12    Period Weeks    Status On-going    Target Date 10/30/21      PT LONG TERM GOAL #4   Title Pt will be able to maintain short sit with feet not supported >5 min for improved sitting balance and core strength.    Time 12    Period Weeks    Status New    Target Date 10/30/21      PT LONG TERM GOAL #5   Title Pt will report being able to perform 20 minutes of manual w/c propulsion around home for improved UE strength and mobility.    Baseline 05/14/21- pt has manual w/c but has yet to use it. 08/04/21 Has manual chair but not using much at home still. Plans  to start soon when schedule opens up a bit    Time 12    Period Weeks    Status On-going    Target Date 10/30/21      Additional Long Term Goals   Additional Long Term Goals Yes      PT LONG TERM GOAL #6   Title Pt will be able to perform squat/pivot transfer mod assist of caregiver for improved mobility to uneven surfaces.    Baseline unable    Time 12    Period Weeks    Status On-going    Target Date 10/30/21      PT LONG TERM GOAL #7   Title Pt will be able to perform "C" walk supine to long sit supervision to right for improved mobility.    Time 12    Period Weeks    Status New    Target Date 10/30/21                   Plan - 08/21/21 1154     Clinical Impression Statement PT reassessed functional mobility for STGs as pt is discharging today to return to [facility] for their day program. Has partially met 3/4 goals at this time. She is requiring less assistance with lateral scoot transfers and all bed mobility. Continues to show improving functional strength with mobility activities. She is showing more strength in LLE with strengthening activities moving leg through increasing  ranges. Pt continues to benefit from skilled PT. Will discharge from this outpatient at this time. Will most likely return after her stay at [facility] for the Day program.    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navy officer Evolving/Moderate complexity    Rehab Potential Good    PT Frequency 3x / week    PT Duration 12 weeks    PT Treatment/Interventions ADLs/Self Care Home Management;Electrical Stimulation;DME Instruction;Neuromuscular re-education;Manual techniques;Therapeutic exercise;Balance training;Therapeutic activities;Cryotherapy;Moist Heat;Functional mobility training;Stair training;Gait training;Patient/family education;Orthotic Fit/Training;Wheelchair mobility training;Dry needling;Passive range of motion;Vestibular    PT Next Visit Plan Discharge today as pt going to Day Program at [facility] for awhile.    Consulted and Agree with Plan of Care Patient;Family member/caregiver    Family Member Consulted Caregiver - [name]             Patient will benefit from skilled therapeutic intervention in order to improve the following deficits and impairments:  Decreased balance, Decreased mobility, Decreased strength, Impaired sensation, Postural dysfunction, Impaired flexibility, Impaired UE functional use, Impaired tone, Decreased range of motion  Visit Diagnosis: Muscle weakness (generalized)  Quadriplegia, C5-C7 incomplete ([place])     Problem List Patient Active Problem List   Diagnosis Date Noted   Quadriplegia, C5-C7 incomplete ([place]) 01/16/2021   History of spinal fracture 01/16/2021   Suprapubic catheter ([place]) 01/16/2021   Encounter for routine gynecological examination 09/28/2013   Onychomycosis  09/28/2013   Foot deformity, acquired 03/26/2012   Encounter for preventive health examination 12/25/2010   ROSACEA 08/25/2009   Disturbance in sleep behavior 03/11/2008   SKIN CANCER, HX OF 03/11/2008   DYSURIA, HX OF 03/11/2008   Hyperlipidemia 02/10/2007   CERVICALGIA 02/10/2007    Electa Sniff, PT, DPT, NCS 08/21/2021, 11:57 AM  [facility] [address] [place], [place], [address] Phone: [phone]   Fax:  [place]  Name: [name] MRN: [id number] Date of Birth: Jun 20, 1950

## 2021-08-21 NOTE — Telephone Encounter (Signed)
Referral sent 

## 2021-08-25 ENCOUNTER — Ambulatory Visit: Payer: Medicare PPO

## 2021-08-25 ENCOUNTER — Encounter: Payer: Medicare PPO | Admitting: Occupational Therapy

## 2021-08-25 DIAGNOSIS — R262 Difficulty in walking, not elsewhere classified: Secondary | ICD-10-CM | POA: Diagnosis not present

## 2021-08-25 DIAGNOSIS — I890 Lymphedema, not elsewhere classified: Secondary | ICD-10-CM | POA: Diagnosis not present

## 2021-08-25 DIAGNOSIS — R269 Unspecified abnormalities of gait and mobility: Secondary | ICD-10-CM | POA: Diagnosis not present

## 2021-08-25 DIAGNOSIS — R29898 Other symptoms and signs involving the musculoskeletal system: Secondary | ICD-10-CM | POA: Diagnosis not present

## 2021-08-25 DIAGNOSIS — R2689 Other abnormalities of gait and mobility: Secondary | ICD-10-CM | POA: Diagnosis not present

## 2021-08-25 DIAGNOSIS — G8254 Quadriplegia, C5-C7 incomplete: Secondary | ICD-10-CM | POA: Diagnosis not present

## 2021-08-25 DIAGNOSIS — M6250 Muscle wasting and atrophy, not elsewhere classified, unspecified site: Secondary | ICD-10-CM | POA: Diagnosis not present

## 2021-08-25 DIAGNOSIS — R2991 Unspecified symptoms and signs involving the musculoskeletal system: Secondary | ICD-10-CM | POA: Diagnosis not present

## 2021-08-25 DIAGNOSIS — M2560 Stiffness of unspecified joint, not elsewhere classified: Secondary | ICD-10-CM | POA: Diagnosis not present

## 2021-08-26 DIAGNOSIS — R269 Unspecified abnormalities of gait and mobility: Secondary | ICD-10-CM | POA: Diagnosis not present

## 2021-08-26 DIAGNOSIS — R2689 Other abnormalities of gait and mobility: Secondary | ICD-10-CM | POA: Diagnosis not present

## 2021-08-26 DIAGNOSIS — I890 Lymphedema, not elsewhere classified: Secondary | ICD-10-CM | POA: Diagnosis not present

## 2021-08-26 DIAGNOSIS — R262 Difficulty in walking, not elsewhere classified: Secondary | ICD-10-CM | POA: Diagnosis not present

## 2021-08-26 DIAGNOSIS — R2991 Unspecified symptoms and signs involving the musculoskeletal system: Secondary | ICD-10-CM | POA: Diagnosis not present

## 2021-08-26 DIAGNOSIS — G8254 Quadriplegia, C5-C7 incomplete: Secondary | ICD-10-CM | POA: Diagnosis not present

## 2021-08-26 DIAGNOSIS — M6250 Muscle wasting and atrophy, not elsewhere classified, unspecified site: Secondary | ICD-10-CM | POA: Diagnosis not present

## 2021-08-26 DIAGNOSIS — M2560 Stiffness of unspecified joint, not elsewhere classified: Secondary | ICD-10-CM | POA: Diagnosis not present

## 2021-08-26 DIAGNOSIS — R29898 Other symptoms and signs involving the musculoskeletal system: Secondary | ICD-10-CM | POA: Diagnosis not present

## 2021-08-27 ENCOUNTER — Ambulatory Visit: Payer: Medicare PPO | Admitting: Physical Therapy

## 2021-08-27 ENCOUNTER — Encounter: Payer: Medicare PPO | Admitting: Occupational Therapy

## 2021-08-27 DIAGNOSIS — M2560 Stiffness of unspecified joint, not elsewhere classified: Secondary | ICD-10-CM | POA: Diagnosis not present

## 2021-08-27 DIAGNOSIS — R269 Unspecified abnormalities of gait and mobility: Secondary | ICD-10-CM | POA: Diagnosis not present

## 2021-08-27 DIAGNOSIS — R2689 Other abnormalities of gait and mobility: Secondary | ICD-10-CM | POA: Diagnosis not present

## 2021-08-27 DIAGNOSIS — R262 Difficulty in walking, not elsewhere classified: Secondary | ICD-10-CM | POA: Diagnosis not present

## 2021-08-27 DIAGNOSIS — M6250 Muscle wasting and atrophy, not elsewhere classified, unspecified site: Secondary | ICD-10-CM | POA: Diagnosis not present

## 2021-08-27 DIAGNOSIS — I890 Lymphedema, not elsewhere classified: Secondary | ICD-10-CM | POA: Diagnosis not present

## 2021-08-27 DIAGNOSIS — R29898 Other symptoms and signs involving the musculoskeletal system: Secondary | ICD-10-CM | POA: Diagnosis not present

## 2021-08-27 DIAGNOSIS — R2991 Unspecified symptoms and signs involving the musculoskeletal system: Secondary | ICD-10-CM | POA: Diagnosis not present

## 2021-08-27 DIAGNOSIS — G8254 Quadriplegia, C5-C7 incomplete: Secondary | ICD-10-CM | POA: Diagnosis not present

## 2021-08-28 ENCOUNTER — Ambulatory Visit: Payer: Medicare PPO

## 2021-08-28 DIAGNOSIS — M6250 Muscle wasting and atrophy, not elsewhere classified, unspecified site: Secondary | ICD-10-CM | POA: Diagnosis not present

## 2021-08-28 DIAGNOSIS — M2560 Stiffness of unspecified joint, not elsewhere classified: Secondary | ICD-10-CM | POA: Diagnosis not present

## 2021-08-28 DIAGNOSIS — F439 Reaction to severe stress, unspecified: Secondary | ICD-10-CM | POA: Diagnosis not present

## 2021-08-28 DIAGNOSIS — R269 Unspecified abnormalities of gait and mobility: Secondary | ICD-10-CM | POA: Diagnosis not present

## 2021-08-28 DIAGNOSIS — R2991 Unspecified symptoms and signs involving the musculoskeletal system: Secondary | ICD-10-CM | POA: Diagnosis not present

## 2021-08-28 DIAGNOSIS — R262 Difficulty in walking, not elsewhere classified: Secondary | ICD-10-CM | POA: Diagnosis not present

## 2021-08-28 DIAGNOSIS — R2689 Other abnormalities of gait and mobility: Secondary | ICD-10-CM | POA: Diagnosis not present

## 2021-08-28 DIAGNOSIS — G8250 Quadriplegia, unspecified: Secondary | ICD-10-CM | POA: Diagnosis not present

## 2021-08-28 DIAGNOSIS — G8254 Quadriplegia, C5-C7 incomplete: Secondary | ICD-10-CM | POA: Diagnosis not present

## 2021-08-28 DIAGNOSIS — R29898 Other symptoms and signs involving the musculoskeletal system: Secondary | ICD-10-CM | POA: Diagnosis not present

## 2021-08-28 DIAGNOSIS — I890 Lymphedema, not elsewhere classified: Secondary | ICD-10-CM | POA: Diagnosis not present

## 2021-08-31 DIAGNOSIS — I890 Lymphedema, not elsewhere classified: Secondary | ICD-10-CM | POA: Diagnosis not present

## 2021-08-31 DIAGNOSIS — R2689 Other abnormalities of gait and mobility: Secondary | ICD-10-CM | POA: Diagnosis not present

## 2021-08-31 DIAGNOSIS — R29898 Other symptoms and signs involving the musculoskeletal system: Secondary | ICD-10-CM | POA: Diagnosis not present

## 2021-08-31 DIAGNOSIS — G8254 Quadriplegia, C5-C7 incomplete: Secondary | ICD-10-CM | POA: Diagnosis not present

## 2021-08-31 DIAGNOSIS — R262 Difficulty in walking, not elsewhere classified: Secondary | ICD-10-CM | POA: Diagnosis not present

## 2021-08-31 DIAGNOSIS — R269 Unspecified abnormalities of gait and mobility: Secondary | ICD-10-CM | POA: Diagnosis not present

## 2021-08-31 DIAGNOSIS — M6250 Muscle wasting and atrophy, not elsewhere classified, unspecified site: Secondary | ICD-10-CM | POA: Diagnosis not present

## 2021-08-31 DIAGNOSIS — R2991 Unspecified symptoms and signs involving the musculoskeletal system: Secondary | ICD-10-CM | POA: Diagnosis not present

## 2021-08-31 DIAGNOSIS — M2560 Stiffness of unspecified joint, not elsewhere classified: Secondary | ICD-10-CM | POA: Diagnosis not present

## 2021-09-01 ENCOUNTER — Encounter: Payer: Medicare PPO | Admitting: Occupational Therapy

## 2021-09-01 ENCOUNTER — Ambulatory Visit: Payer: Medicare PPO

## 2021-09-01 DIAGNOSIS — R29898 Other symptoms and signs involving the musculoskeletal system: Secondary | ICD-10-CM | POA: Diagnosis not present

## 2021-09-01 DIAGNOSIS — R2689 Other abnormalities of gait and mobility: Secondary | ICD-10-CM | POA: Diagnosis not present

## 2021-09-01 DIAGNOSIS — G8254 Quadriplegia, C5-C7 incomplete: Secondary | ICD-10-CM | POA: Diagnosis not present

## 2021-09-01 DIAGNOSIS — R2991 Unspecified symptoms and signs involving the musculoskeletal system: Secondary | ICD-10-CM | POA: Diagnosis not present

## 2021-09-01 DIAGNOSIS — R269 Unspecified abnormalities of gait and mobility: Secondary | ICD-10-CM | POA: Diagnosis not present

## 2021-09-01 DIAGNOSIS — M2560 Stiffness of unspecified joint, not elsewhere classified: Secondary | ICD-10-CM | POA: Diagnosis not present

## 2021-09-01 DIAGNOSIS — I890 Lymphedema, not elsewhere classified: Secondary | ICD-10-CM | POA: Diagnosis not present

## 2021-09-01 DIAGNOSIS — M6250 Muscle wasting and atrophy, not elsewhere classified, unspecified site: Secondary | ICD-10-CM | POA: Diagnosis not present

## 2021-09-01 DIAGNOSIS — R262 Difficulty in walking, not elsewhere classified: Secondary | ICD-10-CM | POA: Diagnosis not present

## 2021-09-02 DIAGNOSIS — R262 Difficulty in walking, not elsewhere classified: Secondary | ICD-10-CM | POA: Diagnosis not present

## 2021-09-02 DIAGNOSIS — R2991 Unspecified symptoms and signs involving the musculoskeletal system: Secondary | ICD-10-CM | POA: Diagnosis not present

## 2021-09-02 DIAGNOSIS — R2689 Other abnormalities of gait and mobility: Secondary | ICD-10-CM | POA: Diagnosis not present

## 2021-09-02 DIAGNOSIS — I890 Lymphedema, not elsewhere classified: Secondary | ICD-10-CM | POA: Diagnosis not present

## 2021-09-02 DIAGNOSIS — R269 Unspecified abnormalities of gait and mobility: Secondary | ICD-10-CM | POA: Diagnosis not present

## 2021-09-02 DIAGNOSIS — R29898 Other symptoms and signs involving the musculoskeletal system: Secondary | ICD-10-CM | POA: Diagnosis not present

## 2021-09-02 DIAGNOSIS — M6250 Muscle wasting and atrophy, not elsewhere classified, unspecified site: Secondary | ICD-10-CM | POA: Diagnosis not present

## 2021-09-02 DIAGNOSIS — M2560 Stiffness of unspecified joint, not elsewhere classified: Secondary | ICD-10-CM | POA: Diagnosis not present

## 2021-09-02 DIAGNOSIS — G8254 Quadriplegia, C5-C7 incomplete: Secondary | ICD-10-CM | POA: Diagnosis not present

## 2021-09-03 ENCOUNTER — Ambulatory Visit: Payer: Medicare PPO

## 2021-09-03 ENCOUNTER — Encounter: Payer: Medicare PPO | Admitting: Occupational Therapy

## 2021-09-03 DIAGNOSIS — R2991 Unspecified symptoms and signs involving the musculoskeletal system: Secondary | ICD-10-CM | POA: Diagnosis not present

## 2021-09-03 DIAGNOSIS — M6250 Muscle wasting and atrophy, not elsewhere classified, unspecified site: Secondary | ICD-10-CM | POA: Diagnosis not present

## 2021-09-03 DIAGNOSIS — I890 Lymphedema, not elsewhere classified: Secondary | ICD-10-CM | POA: Diagnosis not present

## 2021-09-03 DIAGNOSIS — G8254 Quadriplegia, C5-C7 incomplete: Secondary | ICD-10-CM | POA: Diagnosis not present

## 2021-09-03 DIAGNOSIS — R262 Difficulty in walking, not elsewhere classified: Secondary | ICD-10-CM | POA: Diagnosis not present

## 2021-09-03 DIAGNOSIS — R269 Unspecified abnormalities of gait and mobility: Secondary | ICD-10-CM | POA: Diagnosis not present

## 2021-09-03 DIAGNOSIS — M2560 Stiffness of unspecified joint, not elsewhere classified: Secondary | ICD-10-CM | POA: Diagnosis not present

## 2021-09-03 DIAGNOSIS — R29898 Other symptoms and signs involving the musculoskeletal system: Secondary | ICD-10-CM | POA: Diagnosis not present

## 2021-09-03 DIAGNOSIS — R2689 Other abnormalities of gait and mobility: Secondary | ICD-10-CM | POA: Diagnosis not present

## 2021-09-04 ENCOUNTER — Ambulatory Visit: Payer: Medicare PPO | Admitting: Physical Therapy

## 2021-09-04 DIAGNOSIS — R262 Difficulty in walking, not elsewhere classified: Secondary | ICD-10-CM | POA: Diagnosis not present

## 2021-09-04 DIAGNOSIS — R2991 Unspecified symptoms and signs involving the musculoskeletal system: Secondary | ICD-10-CM | POA: Diagnosis not present

## 2021-09-04 DIAGNOSIS — R269 Unspecified abnormalities of gait and mobility: Secondary | ICD-10-CM | POA: Diagnosis not present

## 2021-09-04 DIAGNOSIS — R29898 Other symptoms and signs involving the musculoskeletal system: Secondary | ICD-10-CM | POA: Diagnosis not present

## 2021-09-04 DIAGNOSIS — R2689 Other abnormalities of gait and mobility: Secondary | ICD-10-CM | POA: Diagnosis not present

## 2021-09-04 DIAGNOSIS — M6250 Muscle wasting and atrophy, not elsewhere classified, unspecified site: Secondary | ICD-10-CM | POA: Diagnosis not present

## 2021-09-04 DIAGNOSIS — I890 Lymphedema, not elsewhere classified: Secondary | ICD-10-CM | POA: Diagnosis not present

## 2021-09-04 DIAGNOSIS — M2560 Stiffness of unspecified joint, not elsewhere classified: Secondary | ICD-10-CM | POA: Diagnosis not present

## 2021-09-04 DIAGNOSIS — G8254 Quadriplegia, C5-C7 incomplete: Secondary | ICD-10-CM | POA: Diagnosis not present

## 2021-09-04 NOTE — Addendum Note (Signed)
Addended by: Elza Rafter D on: 09/04/2021 03:24 PM ? ? Modules accepted: Orders ? ?

## 2021-09-04 NOTE — Telephone Encounter (Signed)
Spoke with patient to clarify needs. Referral placed again based on patient request. ?

## 2021-09-05 ENCOUNTER — Other Ambulatory Visit: Payer: Self-pay | Admitting: Internal Medicine

## 2021-09-07 DIAGNOSIS — M2560 Stiffness of unspecified joint, not elsewhere classified: Secondary | ICD-10-CM | POA: Diagnosis not present

## 2021-09-07 DIAGNOSIS — R2689 Other abnormalities of gait and mobility: Secondary | ICD-10-CM | POA: Diagnosis not present

## 2021-09-07 DIAGNOSIS — M6250 Muscle wasting and atrophy, not elsewhere classified, unspecified site: Secondary | ICD-10-CM | POA: Diagnosis not present

## 2021-09-07 DIAGNOSIS — R262 Difficulty in walking, not elsewhere classified: Secondary | ICD-10-CM | POA: Diagnosis not present

## 2021-09-07 DIAGNOSIS — G8254 Quadriplegia, C5-C7 incomplete: Secondary | ICD-10-CM | POA: Diagnosis not present

## 2021-09-07 DIAGNOSIS — R2991 Unspecified symptoms and signs involving the musculoskeletal system: Secondary | ICD-10-CM | POA: Diagnosis not present

## 2021-09-07 DIAGNOSIS — R29898 Other symptoms and signs involving the musculoskeletal system: Secondary | ICD-10-CM | POA: Diagnosis not present

## 2021-09-07 DIAGNOSIS — R269 Unspecified abnormalities of gait and mobility: Secondary | ICD-10-CM | POA: Diagnosis not present

## 2021-09-07 DIAGNOSIS — I890 Lymphedema, not elsewhere classified: Secondary | ICD-10-CM | POA: Diagnosis not present

## 2021-09-08 ENCOUNTER — Encounter: Payer: Medicare PPO | Admitting: Occupational Therapy

## 2021-09-08 ENCOUNTER — Ambulatory Visit: Payer: Medicare PPO

## 2021-09-08 DIAGNOSIS — R269 Unspecified abnormalities of gait and mobility: Secondary | ICD-10-CM | POA: Diagnosis not present

## 2021-09-08 DIAGNOSIS — M6250 Muscle wasting and atrophy, not elsewhere classified, unspecified site: Secondary | ICD-10-CM | POA: Diagnosis not present

## 2021-09-08 DIAGNOSIS — M2560 Stiffness of unspecified joint, not elsewhere classified: Secondary | ICD-10-CM | POA: Diagnosis not present

## 2021-09-08 DIAGNOSIS — R2991 Unspecified symptoms and signs involving the musculoskeletal system: Secondary | ICD-10-CM | POA: Diagnosis not present

## 2021-09-08 DIAGNOSIS — G8254 Quadriplegia, C5-C7 incomplete: Secondary | ICD-10-CM | POA: Diagnosis not present

## 2021-09-08 DIAGNOSIS — I890 Lymphedema, not elsewhere classified: Secondary | ICD-10-CM | POA: Diagnosis not present

## 2021-09-08 DIAGNOSIS — R262 Difficulty in walking, not elsewhere classified: Secondary | ICD-10-CM | POA: Diagnosis not present

## 2021-09-08 DIAGNOSIS — R2689 Other abnormalities of gait and mobility: Secondary | ICD-10-CM | POA: Diagnosis not present

## 2021-09-08 DIAGNOSIS — R29898 Other symptoms and signs involving the musculoskeletal system: Secondary | ICD-10-CM | POA: Diagnosis not present

## 2021-09-09 DIAGNOSIS — M6250 Muscle wasting and atrophy, not elsewhere classified, unspecified site: Secondary | ICD-10-CM | POA: Diagnosis not present

## 2021-09-09 DIAGNOSIS — R262 Difficulty in walking, not elsewhere classified: Secondary | ICD-10-CM | POA: Diagnosis not present

## 2021-09-09 DIAGNOSIS — M2560 Stiffness of unspecified joint, not elsewhere classified: Secondary | ICD-10-CM | POA: Diagnosis not present

## 2021-09-09 DIAGNOSIS — I890 Lymphedema, not elsewhere classified: Secondary | ICD-10-CM | POA: Diagnosis not present

## 2021-09-09 DIAGNOSIS — R2991 Unspecified symptoms and signs involving the musculoskeletal system: Secondary | ICD-10-CM | POA: Diagnosis not present

## 2021-09-09 DIAGNOSIS — R29898 Other symptoms and signs involving the musculoskeletal system: Secondary | ICD-10-CM | POA: Diagnosis not present

## 2021-09-09 DIAGNOSIS — R2689 Other abnormalities of gait and mobility: Secondary | ICD-10-CM | POA: Diagnosis not present

## 2021-09-09 DIAGNOSIS — R269 Unspecified abnormalities of gait and mobility: Secondary | ICD-10-CM | POA: Diagnosis not present

## 2021-09-09 DIAGNOSIS — G8254 Quadriplegia, C5-C7 incomplete: Secondary | ICD-10-CM | POA: Diagnosis not present

## 2021-09-10 ENCOUNTER — Encounter: Payer: Medicare PPO | Admitting: Occupational Therapy

## 2021-09-10 ENCOUNTER — Ambulatory Visit: Payer: Medicare PPO | Admitting: Physical Therapy

## 2021-09-10 DIAGNOSIS — R29898 Other symptoms and signs involving the musculoskeletal system: Secondary | ICD-10-CM | POA: Diagnosis not present

## 2021-09-10 DIAGNOSIS — I890 Lymphedema, not elsewhere classified: Secondary | ICD-10-CM | POA: Diagnosis not present

## 2021-09-10 DIAGNOSIS — R262 Difficulty in walking, not elsewhere classified: Secondary | ICD-10-CM | POA: Diagnosis not present

## 2021-09-10 DIAGNOSIS — R269 Unspecified abnormalities of gait and mobility: Secondary | ICD-10-CM | POA: Diagnosis not present

## 2021-09-10 DIAGNOSIS — M6250 Muscle wasting and atrophy, not elsewhere classified, unspecified site: Secondary | ICD-10-CM | POA: Diagnosis not present

## 2021-09-10 DIAGNOSIS — R2689 Other abnormalities of gait and mobility: Secondary | ICD-10-CM | POA: Diagnosis not present

## 2021-09-10 DIAGNOSIS — M2560 Stiffness of unspecified joint, not elsewhere classified: Secondary | ICD-10-CM | POA: Diagnosis not present

## 2021-09-10 DIAGNOSIS — G8254 Quadriplegia, C5-C7 incomplete: Secondary | ICD-10-CM | POA: Diagnosis not present

## 2021-09-10 DIAGNOSIS — R2991 Unspecified symptoms and signs involving the musculoskeletal system: Secondary | ICD-10-CM | POA: Diagnosis not present

## 2021-09-11 ENCOUNTER — Encounter: Payer: Medicare PPO | Admitting: Occupational Therapy

## 2021-09-11 ENCOUNTER — Ambulatory Visit: Payer: Medicare PPO

## 2021-09-11 DIAGNOSIS — G8254 Quadriplegia, C5-C7 incomplete: Secondary | ICD-10-CM | POA: Diagnosis not present

## 2021-09-11 DIAGNOSIS — I890 Lymphedema, not elsewhere classified: Secondary | ICD-10-CM | POA: Diagnosis not present

## 2021-09-11 DIAGNOSIS — R2689 Other abnormalities of gait and mobility: Secondary | ICD-10-CM | POA: Diagnosis not present

## 2021-09-11 DIAGNOSIS — M6250 Muscle wasting and atrophy, not elsewhere classified, unspecified site: Secondary | ICD-10-CM | POA: Diagnosis not present

## 2021-09-11 DIAGNOSIS — M2560 Stiffness of unspecified joint, not elsewhere classified: Secondary | ICD-10-CM | POA: Diagnosis not present

## 2021-09-11 DIAGNOSIS — R29898 Other symptoms and signs involving the musculoskeletal system: Secondary | ICD-10-CM | POA: Diagnosis not present

## 2021-09-11 DIAGNOSIS — R262 Difficulty in walking, not elsewhere classified: Secondary | ICD-10-CM | POA: Diagnosis not present

## 2021-09-11 DIAGNOSIS — R2991 Unspecified symptoms and signs involving the musculoskeletal system: Secondary | ICD-10-CM | POA: Diagnosis not present

## 2021-09-11 DIAGNOSIS — R269 Unspecified abnormalities of gait and mobility: Secondary | ICD-10-CM | POA: Diagnosis not present

## 2021-09-14 DIAGNOSIS — R2689 Other abnormalities of gait and mobility: Secondary | ICD-10-CM | POA: Diagnosis not present

## 2021-09-14 DIAGNOSIS — R2991 Unspecified symptoms and signs involving the musculoskeletal system: Secondary | ICD-10-CM | POA: Diagnosis not present

## 2021-09-14 DIAGNOSIS — R262 Difficulty in walking, not elsewhere classified: Secondary | ICD-10-CM | POA: Diagnosis not present

## 2021-09-14 DIAGNOSIS — M6250 Muscle wasting and atrophy, not elsewhere classified, unspecified site: Secondary | ICD-10-CM | POA: Diagnosis not present

## 2021-09-14 DIAGNOSIS — R29898 Other symptoms and signs involving the musculoskeletal system: Secondary | ICD-10-CM | POA: Diagnosis not present

## 2021-09-14 DIAGNOSIS — M2560 Stiffness of unspecified joint, not elsewhere classified: Secondary | ICD-10-CM | POA: Diagnosis not present

## 2021-09-14 DIAGNOSIS — R269 Unspecified abnormalities of gait and mobility: Secondary | ICD-10-CM | POA: Diagnosis not present

## 2021-09-14 DIAGNOSIS — I890 Lymphedema, not elsewhere classified: Secondary | ICD-10-CM | POA: Diagnosis not present

## 2021-09-14 DIAGNOSIS — G8254 Quadriplegia, C5-C7 incomplete: Secondary | ICD-10-CM | POA: Diagnosis not present

## 2021-09-15 ENCOUNTER — Other Ambulatory Visit: Payer: Self-pay | Admitting: Podiatry

## 2021-09-15 ENCOUNTER — Encounter: Payer: Self-pay | Admitting: Physical Medicine and Rehabilitation

## 2021-09-15 DIAGNOSIS — G8254 Quadriplegia, C5-C7 incomplete: Secondary | ICD-10-CM | POA: Diagnosis not present

## 2021-09-15 DIAGNOSIS — R2689 Other abnormalities of gait and mobility: Secondary | ICD-10-CM | POA: Diagnosis not present

## 2021-09-15 DIAGNOSIS — R29898 Other symptoms and signs involving the musculoskeletal system: Secondary | ICD-10-CM | POA: Diagnosis not present

## 2021-09-15 DIAGNOSIS — R269 Unspecified abnormalities of gait and mobility: Secondary | ICD-10-CM | POA: Diagnosis not present

## 2021-09-15 DIAGNOSIS — I890 Lymphedema, not elsewhere classified: Secondary | ICD-10-CM | POA: Diagnosis not present

## 2021-09-15 DIAGNOSIS — R2991 Unspecified symptoms and signs involving the musculoskeletal system: Secondary | ICD-10-CM | POA: Diagnosis not present

## 2021-09-15 DIAGNOSIS — M6250 Muscle wasting and atrophy, not elsewhere classified, unspecified site: Secondary | ICD-10-CM | POA: Diagnosis not present

## 2021-09-15 DIAGNOSIS — M2560 Stiffness of unspecified joint, not elsewhere classified: Secondary | ICD-10-CM | POA: Diagnosis not present

## 2021-09-15 DIAGNOSIS — R262 Difficulty in walking, not elsewhere classified: Secondary | ICD-10-CM | POA: Diagnosis not present

## 2021-09-15 MED ORDER — MUPIROCIN 2 % EX OINT: 1.0000 "application " | TOPICAL_OINTMENT | Freq: Two times a day (BID) | CUTANEOUS | 2 refills | Status: DC

## 2021-09-16 ENCOUNTER — Other Ambulatory Visit: Payer: Self-pay | Admitting: Internal Medicine

## 2021-09-16 DIAGNOSIS — M2560 Stiffness of unspecified joint, not elsewhere classified: Secondary | ICD-10-CM | POA: Diagnosis not present

## 2021-09-16 DIAGNOSIS — R269 Unspecified abnormalities of gait and mobility: Secondary | ICD-10-CM | POA: Diagnosis not present

## 2021-09-16 DIAGNOSIS — G8254 Quadriplegia, C5-C7 incomplete: Secondary | ICD-10-CM | POA: Diagnosis not present

## 2021-09-16 DIAGNOSIS — R2689 Other abnormalities of gait and mobility: Secondary | ICD-10-CM | POA: Diagnosis not present

## 2021-09-16 DIAGNOSIS — I890 Lymphedema, not elsewhere classified: Secondary | ICD-10-CM | POA: Diagnosis not present

## 2021-09-16 DIAGNOSIS — R29898 Other symptoms and signs involving the musculoskeletal system: Secondary | ICD-10-CM | POA: Diagnosis not present

## 2021-09-16 DIAGNOSIS — R2991 Unspecified symptoms and signs involving the musculoskeletal system: Secondary | ICD-10-CM | POA: Diagnosis not present

## 2021-09-16 DIAGNOSIS — M6250 Muscle wasting and atrophy, not elsewhere classified, unspecified site: Secondary | ICD-10-CM | POA: Diagnosis not present

## 2021-09-16 DIAGNOSIS — R262 Difficulty in walking, not elsewhere classified: Secondary | ICD-10-CM | POA: Diagnosis not present

## 2021-09-17 DIAGNOSIS — R2689 Other abnormalities of gait and mobility: Secondary | ICD-10-CM | POA: Diagnosis not present

## 2021-09-17 DIAGNOSIS — G8254 Quadriplegia, C5-C7 incomplete: Secondary | ICD-10-CM | POA: Diagnosis not present

## 2021-09-17 DIAGNOSIS — R29898 Other symptoms and signs involving the musculoskeletal system: Secondary | ICD-10-CM | POA: Diagnosis not present

## 2021-09-17 DIAGNOSIS — N390 Urinary tract infection, site not specified: Secondary | ICD-10-CM | POA: Diagnosis not present

## 2021-09-17 DIAGNOSIS — M6250 Muscle wasting and atrophy, not elsewhere classified, unspecified site: Secondary | ICD-10-CM | POA: Diagnosis not present

## 2021-09-17 DIAGNOSIS — R269 Unspecified abnormalities of gait and mobility: Secondary | ICD-10-CM | POA: Diagnosis not present

## 2021-09-17 DIAGNOSIS — R262 Difficulty in walking, not elsewhere classified: Secondary | ICD-10-CM | POA: Diagnosis not present

## 2021-09-17 DIAGNOSIS — M2560 Stiffness of unspecified joint, not elsewhere classified: Secondary | ICD-10-CM | POA: Diagnosis not present

## 2021-09-17 DIAGNOSIS — Z87440 Personal history of urinary (tract) infections: Secondary | ICD-10-CM | POA: Diagnosis not present

## 2021-09-17 DIAGNOSIS — R2991 Unspecified symptoms and signs involving the musculoskeletal system: Secondary | ICD-10-CM | POA: Diagnosis not present

## 2021-09-17 DIAGNOSIS — I890 Lymphedema, not elsewhere classified: Secondary | ICD-10-CM | POA: Diagnosis not present

## 2021-09-18 DIAGNOSIS — M2560 Stiffness of unspecified joint, not elsewhere classified: Secondary | ICD-10-CM | POA: Diagnosis not present

## 2021-09-18 DIAGNOSIS — I890 Lymphedema, not elsewhere classified: Secondary | ICD-10-CM | POA: Diagnosis not present

## 2021-09-18 DIAGNOSIS — M6250 Muscle wasting and atrophy, not elsewhere classified, unspecified site: Secondary | ICD-10-CM | POA: Diagnosis not present

## 2021-09-18 DIAGNOSIS — R262 Difficulty in walking, not elsewhere classified: Secondary | ICD-10-CM | POA: Diagnosis not present

## 2021-09-18 DIAGNOSIS — R29898 Other symptoms and signs involving the musculoskeletal system: Secondary | ICD-10-CM | POA: Diagnosis not present

## 2021-09-18 DIAGNOSIS — R269 Unspecified abnormalities of gait and mobility: Secondary | ICD-10-CM | POA: Diagnosis not present

## 2021-09-18 DIAGNOSIS — G8254 Quadriplegia, C5-C7 incomplete: Secondary | ICD-10-CM | POA: Diagnosis not present

## 2021-09-18 DIAGNOSIS — R2991 Unspecified symptoms and signs involving the musculoskeletal system: Secondary | ICD-10-CM | POA: Diagnosis not present

## 2021-09-18 DIAGNOSIS — R2689 Other abnormalities of gait and mobility: Secondary | ICD-10-CM | POA: Diagnosis not present

## 2021-09-21 ENCOUNTER — Encounter: Payer: Self-pay | Admitting: Internal Medicine

## 2021-09-21 DIAGNOSIS — M2560 Stiffness of unspecified joint, not elsewhere classified: Secondary | ICD-10-CM | POA: Diagnosis not present

## 2021-09-21 DIAGNOSIS — F439 Reaction to severe stress, unspecified: Secondary | ICD-10-CM | POA: Diagnosis not present

## 2021-09-21 DIAGNOSIS — M6250 Muscle wasting and atrophy, not elsewhere classified, unspecified site: Secondary | ICD-10-CM | POA: Diagnosis not present

## 2021-09-21 DIAGNOSIS — R269 Unspecified abnormalities of gait and mobility: Secondary | ICD-10-CM | POA: Diagnosis not present

## 2021-09-21 DIAGNOSIS — R29898 Other symptoms and signs involving the musculoskeletal system: Secondary | ICD-10-CM | POA: Diagnosis not present

## 2021-09-21 DIAGNOSIS — R2991 Unspecified symptoms and signs involving the musculoskeletal system: Secondary | ICD-10-CM | POA: Diagnosis not present

## 2021-09-21 DIAGNOSIS — R2689 Other abnormalities of gait and mobility: Secondary | ICD-10-CM | POA: Diagnosis not present

## 2021-09-21 DIAGNOSIS — R262 Difficulty in walking, not elsewhere classified: Secondary | ICD-10-CM | POA: Diagnosis not present

## 2021-09-21 DIAGNOSIS — G8254 Quadriplegia, C5-C7 incomplete: Secondary | ICD-10-CM | POA: Diagnosis not present

## 2021-09-21 DIAGNOSIS — I890 Lymphedema, not elsewhere classified: Secondary | ICD-10-CM | POA: Diagnosis not present

## 2021-09-21 DIAGNOSIS — G8250 Quadriplegia, unspecified: Secondary | ICD-10-CM | POA: Diagnosis not present

## 2021-09-22 DIAGNOSIS — G8254 Quadriplegia, C5-C7 incomplete: Secondary | ICD-10-CM | POA: Diagnosis not present

## 2021-09-22 DIAGNOSIS — G8250 Quadriplegia, unspecified: Secondary | ICD-10-CM | POA: Diagnosis not present

## 2021-09-22 DIAGNOSIS — R29898 Other symptoms and signs involving the musculoskeletal system: Secondary | ICD-10-CM | POA: Diagnosis not present

## 2021-09-22 DIAGNOSIS — R2689 Other abnormalities of gait and mobility: Secondary | ICD-10-CM | POA: Diagnosis not present

## 2021-09-22 DIAGNOSIS — M2560 Stiffness of unspecified joint, not elsewhere classified: Secondary | ICD-10-CM | POA: Diagnosis not present

## 2021-09-22 DIAGNOSIS — R2991 Unspecified symptoms and signs involving the musculoskeletal system: Secondary | ICD-10-CM | POA: Diagnosis not present

## 2021-09-22 DIAGNOSIS — R269 Unspecified abnormalities of gait and mobility: Secondary | ICD-10-CM | POA: Diagnosis not present

## 2021-09-22 DIAGNOSIS — R262 Difficulty in walking, not elsewhere classified: Secondary | ICD-10-CM | POA: Diagnosis not present

## 2021-09-22 DIAGNOSIS — F439 Reaction to severe stress, unspecified: Secondary | ICD-10-CM | POA: Diagnosis not present

## 2021-09-22 DIAGNOSIS — I890 Lymphedema, not elsewhere classified: Secondary | ICD-10-CM | POA: Diagnosis not present

## 2021-09-22 DIAGNOSIS — M6250 Muscle wasting and atrophy, not elsewhere classified, unspecified site: Secondary | ICD-10-CM | POA: Diagnosis not present

## 2021-09-23 DIAGNOSIS — M2560 Stiffness of unspecified joint, not elsewhere classified: Secondary | ICD-10-CM | POA: Diagnosis not present

## 2021-09-23 DIAGNOSIS — R29898 Other symptoms and signs involving the musculoskeletal system: Secondary | ICD-10-CM | POA: Diagnosis not present

## 2021-09-23 DIAGNOSIS — R269 Unspecified abnormalities of gait and mobility: Secondary | ICD-10-CM | POA: Diagnosis not present

## 2021-09-23 DIAGNOSIS — M6250 Muscle wasting and atrophy, not elsewhere classified, unspecified site: Secondary | ICD-10-CM | POA: Diagnosis not present

## 2021-09-23 DIAGNOSIS — R2689 Other abnormalities of gait and mobility: Secondary | ICD-10-CM | POA: Diagnosis not present

## 2021-09-23 DIAGNOSIS — G8254 Quadriplegia, C5-C7 incomplete: Secondary | ICD-10-CM | POA: Diagnosis not present

## 2021-09-23 DIAGNOSIS — R262 Difficulty in walking, not elsewhere classified: Secondary | ICD-10-CM | POA: Diagnosis not present

## 2021-09-23 DIAGNOSIS — R2991 Unspecified symptoms and signs involving the musculoskeletal system: Secondary | ICD-10-CM | POA: Diagnosis not present

## 2021-09-23 DIAGNOSIS — I890 Lymphedema, not elsewhere classified: Secondary | ICD-10-CM | POA: Diagnosis not present

## 2021-09-24 DIAGNOSIS — R269 Unspecified abnormalities of gait and mobility: Secondary | ICD-10-CM | POA: Diagnosis not present

## 2021-09-24 DIAGNOSIS — G8254 Quadriplegia, C5-C7 incomplete: Secondary | ICD-10-CM | POA: Diagnosis not present

## 2021-09-24 DIAGNOSIS — R29898 Other symptoms and signs involving the musculoskeletal system: Secondary | ICD-10-CM | POA: Diagnosis not present

## 2021-09-24 DIAGNOSIS — R2689 Other abnormalities of gait and mobility: Secondary | ICD-10-CM | POA: Diagnosis not present

## 2021-09-24 DIAGNOSIS — M2560 Stiffness of unspecified joint, not elsewhere classified: Secondary | ICD-10-CM | POA: Diagnosis not present

## 2021-09-24 DIAGNOSIS — I890 Lymphedema, not elsewhere classified: Secondary | ICD-10-CM | POA: Diagnosis not present

## 2021-09-24 DIAGNOSIS — R2991 Unspecified symptoms and signs involving the musculoskeletal system: Secondary | ICD-10-CM | POA: Diagnosis not present

## 2021-09-24 DIAGNOSIS — R262 Difficulty in walking, not elsewhere classified: Secondary | ICD-10-CM | POA: Diagnosis not present

## 2021-09-24 DIAGNOSIS — M6250 Muscle wasting and atrophy, not elsewhere classified, unspecified site: Secondary | ICD-10-CM | POA: Diagnosis not present

## 2021-09-25 DIAGNOSIS — I890 Lymphedema, not elsewhere classified: Secondary | ICD-10-CM | POA: Diagnosis not present

## 2021-09-25 DIAGNOSIS — M6250 Muscle wasting and atrophy, not elsewhere classified, unspecified site: Secondary | ICD-10-CM | POA: Diagnosis not present

## 2021-09-25 DIAGNOSIS — R269 Unspecified abnormalities of gait and mobility: Secondary | ICD-10-CM | POA: Diagnosis not present

## 2021-09-25 DIAGNOSIS — M2560 Stiffness of unspecified joint, not elsewhere classified: Secondary | ICD-10-CM | POA: Diagnosis not present

## 2021-09-25 DIAGNOSIS — R2689 Other abnormalities of gait and mobility: Secondary | ICD-10-CM | POA: Diagnosis not present

## 2021-09-25 DIAGNOSIS — R262 Difficulty in walking, not elsewhere classified: Secondary | ICD-10-CM | POA: Diagnosis not present

## 2021-09-25 DIAGNOSIS — R29898 Other symptoms and signs involving the musculoskeletal system: Secondary | ICD-10-CM | POA: Diagnosis not present

## 2021-09-25 DIAGNOSIS — G8254 Quadriplegia, C5-C7 incomplete: Secondary | ICD-10-CM | POA: Diagnosis not present

## 2021-09-25 DIAGNOSIS — R2991 Unspecified symptoms and signs involving the musculoskeletal system: Secondary | ICD-10-CM | POA: Diagnosis not present

## 2021-09-28 DIAGNOSIS — M6250 Muscle wasting and atrophy, not elsewhere classified, unspecified site: Secondary | ICD-10-CM | POA: Diagnosis not present

## 2021-09-28 DIAGNOSIS — R29898 Other symptoms and signs involving the musculoskeletal system: Secondary | ICD-10-CM | POA: Diagnosis not present

## 2021-09-28 DIAGNOSIS — I890 Lymphedema, not elsewhere classified: Secondary | ICD-10-CM | POA: Diagnosis not present

## 2021-09-28 DIAGNOSIS — G8254 Quadriplegia, C5-C7 incomplete: Secondary | ICD-10-CM | POA: Diagnosis not present

## 2021-09-28 DIAGNOSIS — R2689 Other abnormalities of gait and mobility: Secondary | ICD-10-CM | POA: Diagnosis not present

## 2021-09-28 DIAGNOSIS — R269 Unspecified abnormalities of gait and mobility: Secondary | ICD-10-CM | POA: Diagnosis not present

## 2021-09-28 DIAGNOSIS — R2991 Unspecified symptoms and signs involving the musculoskeletal system: Secondary | ICD-10-CM | POA: Diagnosis not present

## 2021-09-28 DIAGNOSIS — M2560 Stiffness of unspecified joint, not elsewhere classified: Secondary | ICD-10-CM | POA: Diagnosis not present

## 2021-09-28 DIAGNOSIS — R262 Difficulty in walking, not elsewhere classified: Secondary | ICD-10-CM | POA: Diagnosis not present

## 2021-09-29 DIAGNOSIS — R2689 Other abnormalities of gait and mobility: Secondary | ICD-10-CM | POA: Diagnosis not present

## 2021-09-29 DIAGNOSIS — R269 Unspecified abnormalities of gait and mobility: Secondary | ICD-10-CM | POA: Diagnosis not present

## 2021-09-29 DIAGNOSIS — I890 Lymphedema, not elsewhere classified: Secondary | ICD-10-CM | POA: Diagnosis not present

## 2021-09-29 DIAGNOSIS — G8254 Quadriplegia, C5-C7 incomplete: Secondary | ICD-10-CM | POA: Diagnosis not present

## 2021-09-29 DIAGNOSIS — M6250 Muscle wasting and atrophy, not elsewhere classified, unspecified site: Secondary | ICD-10-CM | POA: Diagnosis not present

## 2021-09-29 DIAGNOSIS — R2991 Unspecified symptoms and signs involving the musculoskeletal system: Secondary | ICD-10-CM | POA: Diagnosis not present

## 2021-09-29 DIAGNOSIS — R262 Difficulty in walking, not elsewhere classified: Secondary | ICD-10-CM | POA: Diagnosis not present

## 2021-09-29 DIAGNOSIS — R29898 Other symptoms and signs involving the musculoskeletal system: Secondary | ICD-10-CM | POA: Diagnosis not present

## 2021-09-29 DIAGNOSIS — M2560 Stiffness of unspecified joint, not elsewhere classified: Secondary | ICD-10-CM | POA: Diagnosis not present

## 2021-09-30 DIAGNOSIS — R269 Unspecified abnormalities of gait and mobility: Secondary | ICD-10-CM | POA: Diagnosis not present

## 2021-09-30 DIAGNOSIS — M6250 Muscle wasting and atrophy, not elsewhere classified, unspecified site: Secondary | ICD-10-CM | POA: Diagnosis not present

## 2021-09-30 DIAGNOSIS — R29898 Other symptoms and signs involving the musculoskeletal system: Secondary | ICD-10-CM | POA: Diagnosis not present

## 2021-09-30 DIAGNOSIS — I890 Lymphedema, not elsewhere classified: Secondary | ICD-10-CM | POA: Diagnosis not present

## 2021-09-30 DIAGNOSIS — R2689 Other abnormalities of gait and mobility: Secondary | ICD-10-CM | POA: Diagnosis not present

## 2021-09-30 DIAGNOSIS — R2991 Unspecified symptoms and signs involving the musculoskeletal system: Secondary | ICD-10-CM | POA: Diagnosis not present

## 2021-09-30 DIAGNOSIS — R262 Difficulty in walking, not elsewhere classified: Secondary | ICD-10-CM | POA: Diagnosis not present

## 2021-09-30 DIAGNOSIS — G8254 Quadriplegia, C5-C7 incomplete: Secondary | ICD-10-CM | POA: Diagnosis not present

## 2021-09-30 DIAGNOSIS — M2560 Stiffness of unspecified joint, not elsewhere classified: Secondary | ICD-10-CM | POA: Diagnosis not present

## 2021-10-01 DIAGNOSIS — R29898 Other symptoms and signs involving the musculoskeletal system: Secondary | ICD-10-CM | POA: Diagnosis not present

## 2021-10-01 DIAGNOSIS — M6250 Muscle wasting and atrophy, not elsewhere classified, unspecified site: Secondary | ICD-10-CM | POA: Diagnosis not present

## 2021-10-01 DIAGNOSIS — I890 Lymphedema, not elsewhere classified: Secondary | ICD-10-CM | POA: Diagnosis not present

## 2021-10-01 DIAGNOSIS — R262 Difficulty in walking, not elsewhere classified: Secondary | ICD-10-CM | POA: Diagnosis not present

## 2021-10-01 DIAGNOSIS — R269 Unspecified abnormalities of gait and mobility: Secondary | ICD-10-CM | POA: Diagnosis not present

## 2021-10-01 DIAGNOSIS — R2991 Unspecified symptoms and signs involving the musculoskeletal system: Secondary | ICD-10-CM | POA: Diagnosis not present

## 2021-10-01 DIAGNOSIS — R2689 Other abnormalities of gait and mobility: Secondary | ICD-10-CM | POA: Diagnosis not present

## 2021-10-01 DIAGNOSIS — G8254 Quadriplegia, C5-C7 incomplete: Secondary | ICD-10-CM | POA: Diagnosis not present

## 2021-10-01 DIAGNOSIS — M2560 Stiffness of unspecified joint, not elsewhere classified: Secondary | ICD-10-CM | POA: Diagnosis not present

## 2021-10-02 DIAGNOSIS — M2560 Stiffness of unspecified joint, not elsewhere classified: Secondary | ICD-10-CM | POA: Diagnosis not present

## 2021-10-02 DIAGNOSIS — F439 Reaction to severe stress, unspecified: Secondary | ICD-10-CM | POA: Diagnosis not present

## 2021-10-02 DIAGNOSIS — G8250 Quadriplegia, unspecified: Secondary | ICD-10-CM | POA: Diagnosis not present

## 2021-10-02 DIAGNOSIS — R2689 Other abnormalities of gait and mobility: Secondary | ICD-10-CM | POA: Diagnosis not present

## 2021-10-02 DIAGNOSIS — M6250 Muscle wasting and atrophy, not elsewhere classified, unspecified site: Secondary | ICD-10-CM | POA: Diagnosis not present

## 2021-10-02 DIAGNOSIS — R29898 Other symptoms and signs involving the musculoskeletal system: Secondary | ICD-10-CM | POA: Diagnosis not present

## 2021-10-02 DIAGNOSIS — I890 Lymphedema, not elsewhere classified: Secondary | ICD-10-CM | POA: Diagnosis not present

## 2021-10-02 DIAGNOSIS — R262 Difficulty in walking, not elsewhere classified: Secondary | ICD-10-CM | POA: Diagnosis not present

## 2021-10-02 DIAGNOSIS — R269 Unspecified abnormalities of gait and mobility: Secondary | ICD-10-CM | POA: Diagnosis not present

## 2021-10-02 DIAGNOSIS — R2991 Unspecified symptoms and signs involving the musculoskeletal system: Secondary | ICD-10-CM | POA: Diagnosis not present

## 2021-10-02 DIAGNOSIS — G8254 Quadriplegia, C5-C7 incomplete: Secondary | ICD-10-CM | POA: Diagnosis not present

## 2021-10-06 DIAGNOSIS — M2560 Stiffness of unspecified joint, not elsewhere classified: Secondary | ICD-10-CM | POA: Diagnosis not present

## 2021-10-06 DIAGNOSIS — M6250 Muscle wasting and atrophy, not elsewhere classified, unspecified site: Secondary | ICD-10-CM | POA: Diagnosis not present

## 2021-10-06 DIAGNOSIS — R269 Unspecified abnormalities of gait and mobility: Secondary | ICD-10-CM | POA: Diagnosis not present

## 2021-10-06 DIAGNOSIS — I890 Lymphedema, not elsewhere classified: Secondary | ICD-10-CM | POA: Diagnosis not present

## 2021-10-06 DIAGNOSIS — R2991 Unspecified symptoms and signs involving the musculoskeletal system: Secondary | ICD-10-CM | POA: Diagnosis not present

## 2021-10-06 DIAGNOSIS — R2689 Other abnormalities of gait and mobility: Secondary | ICD-10-CM | POA: Diagnosis not present

## 2021-10-06 DIAGNOSIS — R29898 Other symptoms and signs involving the musculoskeletal system: Secondary | ICD-10-CM | POA: Diagnosis not present

## 2021-10-06 DIAGNOSIS — G8254 Quadriplegia, C5-C7 incomplete: Secondary | ICD-10-CM | POA: Diagnosis not present

## 2021-10-06 DIAGNOSIS — R262 Difficulty in walking, not elsewhere classified: Secondary | ICD-10-CM | POA: Diagnosis not present

## 2021-10-09 ENCOUNTER — Ambulatory Visit: Payer: Medicare PPO | Admitting: Infectious Diseases

## 2021-10-09 ENCOUNTER — Other Ambulatory Visit: Payer: Self-pay

## 2021-10-09 VITALS — BP 126/71 | HR 75 | Temp 98.0°F | Resp 16 | Ht 64.5 in | Wt 119.0 lb

## 2021-10-09 DIAGNOSIS — R32 Unspecified urinary incontinence: Secondary | ICD-10-CM | POA: Diagnosis not present

## 2021-10-09 DIAGNOSIS — Z9359 Other cystostomy status: Secondary | ICD-10-CM | POA: Diagnosis not present

## 2021-10-09 DIAGNOSIS — N319 Neuromuscular dysfunction of bladder, unspecified: Secondary | ICD-10-CM

## 2021-10-09 DIAGNOSIS — G8254 Quadriplegia, C5-C7 incomplete: Secondary | ICD-10-CM | POA: Diagnosis not present

## 2021-10-09 DIAGNOSIS — N390 Urinary tract infection, site not specified: Secondary | ICD-10-CM | POA: Diagnosis not present

## 2021-10-09 DIAGNOSIS — Z1612 Extended spectrum beta lactamase (ESBL) resistance: Secondary | ICD-10-CM | POA: Diagnosis not present

## 2021-10-09 DIAGNOSIS — A499 Bacterial infection, unspecified: Secondary | ICD-10-CM

## 2021-10-09 DIAGNOSIS — R338 Other retention of urine: Secondary | ICD-10-CM | POA: Diagnosis not present

## 2021-10-09 MED ORDER — ASCORBIC ACID 1000 MG PO TABS: 1000.0000 mg | ORAL_TABLET | Freq: Four times a day (QID) | ORAL | 3 refills | Status: DC

## 2021-10-09 MED ORDER — METHENAMINE HIPPURATE 1 G PO TABS
1.0000 g | ORAL_TABLET | Freq: Two times a day (BID) | ORAL | 3 refills | Status: DC
Start: 2021-10-09 — End: 2022-01-08

## 2021-10-09 NOTE — Progress Notes (Signed)
? ?  ? ?Patient Active Problem List  ? Diagnosis Date Noted  ? Quadriplegia, C5-C7 incomplete ([place]) 01/16/2021  ? History of spinal fracture 01/16/2021  ? Suprapubic catheter ([place]) 01/16/2021  ? Encounter for routine gynecological examination 09/28/2013  ? Onychomycosis 09/28/2013  ? Foot deformity, acquired 03/26/2012  ? Encounter for preventive health examination 12/25/2010  ? ROSACEA 08/25/2009  ? Disturbance in sleep behavior 03/11/2008  ? SKIN CANCER, HX OF 03/11/2008  ? DYSURIA, HX OF 03/11/2008  ? Hyperlipidemia 02/10/2007  ? CERVICALGIA 02/10/2007  ? ?Current Outpatient Medications on File Prior to Visit  ?Medication Sig Dispense Refill  ? ascorbic acid (VITAMIN C) 1000 MG tablet Take by mouth 2 (two) times daily.    ? aspirin 81 MG chewable tablet Chew by mouth.    ? atorvastatin (LIPITOR) 20 MG tablet TAKE 1 TABLET BY MOUTH EVERY DAY 90 tablet 1  ? baclofen (LIORESAL) 20 MG tablet TAKE 1&1/2 TABLETS BY MOUTH EVERY MORNING, 1 TABLET IN THE EVENING AND 1&1/2TABS AT BEDTIME 360 tablet 1  ? cholecalciferol (VITAMIN D3) 25 MCG (1000 UNIT) tablet Take 1,000 Units by mouth daily. 2000u    ? Cranberry 200 MG CAPS Take by mouth.    ? [facility] COENZYME Q-10 100 MG capsule Take 100 mg by mouth daily.     ? Docusate Sodium (DSS) 100 MG CAPS Take by mouth.    ? fesoterodine (TOVIAZ) 8 MG TB24 tablet Take 8 mg by mouth daily.    ? gabapentin (NEURONTIN) 600 MG tablet TAKE 2 TABLETS (1,200 MG TOTAL) BY MOUTH 3 (THREE) TIMES DAILY. 270 tablet 1  ? metroNIDAZOLE (METROGEL) 1 % gel APPLY TOPICALLY EVERY DAY 60 g 10  ? mirabegron ER (MYRBETRIQ) 25 MG TB24 tablet Take 50 mg by mouth.    ? Multiple Vitamins-Minerals (CENTRUM SILVER ULTRA WOMENS PO) Take by mouth.    ? mupirocin ointment (BACTROBAN) 2 % Apply 1 application. topically 2 (two) times daily. 30 g 2  ? nortriptyline (PAMELOR) 25 MG capsule TAKE 2 CAPSULES BY MOUTH DAILY. 180 capsule 1  ? SENNA CO by Combination route.    ? tizanidine (ZANAFLEX) 2 MG capsule Take 2 mg by  mouth daily.    ? urea (CARMOL) 40 % CREA Apply daily to callused areas of feet 120 g 3  ? zolpidem (AMBIEN) 10 MG tablet TAKE 1/2-1 TABLET BY MOUTH AT BEDTIME AS NEEDED FOR SLEEP 30 tablet 0  ? ?No current facility-administered medications on file prior to visit.  ? ? ? ? ?Subjective: ?71 YO Female with h/o Incomplete quadriplegia with neurogenic bladder s/p SPC with urinary incontinence who is referred from [facility] from [place], [place] for urine cx growing ESBL Kleb pneumo.  ? ?Patient is in a wheel chair and accompanied by her care taker. She [facility] [place] and lives with her husband. She has had UTIs intermittently in the past but thinks it has been recurring frequently since early this year. She has completed 2 courses of ciprofloxacin, 1 course of doxycycline and recently took a course of levofloxacin, last dose 4/22 so far since beginning of the year. On asking about specific symptoms related to UTI, she says she has urinary leak, spasms become worse and "she feels it". She has a SPC which gets changed every 4 weeks and last change was on 4/20. Tells me her urine culture was done from straight cath. Denies any fevers, chills, suprapubic tenderness. Denies nausea, vomiting  and diarrhea.  ? ?Review of Systems: ?ROS all systems reviewed with pertinent positives and negatives as listed above ? ?Past Medical History:  ?Diagnosis Date  ? CERVICAL POLYP 03/11/2008  ? Qualifier: Diagnosis of  By: Regis Bill MD, Standley Brooking   ? Colon polyps 2005  ? on colonscopy Dr. Fuller Plan  ? Fibroid 2004  ? Per Dr. Ouida Sills  ? History of shingles   ? face and mouth  ? Hx of skin cancer, basal cell   ? Rosacea   ? Sciatica of left side 09/28/2013  ? Scoliosis   ? noted on mri done for back pain  ? ?Past Surgical History:  ?Procedure Laterality Date  ? BUNIONECTOMY    ? ? ?Social History  ? ?Tobacco Use  ? Smoking status: Never  ? Smokeless tobacco: Never  ?Vaping Use  ? Vaping Use: Never used   ?Substance Use Topics  ? Alcohol use: Yes  ? ? ?Family History  ?Problem Relation Age of Onset  ? Hypertension Father   ? Osteoporosis Other   ? Breast cancer Neg Hx   ? ? ?No Known Allergies ? ?Health Maintenance  ?Topic Date Due  ? TETANUS/TDAP  06/28/2016  ? Fecal DNA (Cologuard)  06/10/2020  ? COVID-19 Vaccine (3 - Pfizer risk series) 03/20/2021  ? MAMMOGRAM  01/02/2022  ? INFLUENZA VACCINE  01/12/2022  ? Pneumonia Vaccine 21+ Years old  Completed  ? DEXA SCAN  Completed  ? Hepatitis C Screening  Completed  ? Zoster Vaccines- Shingrix  Completed  ? HPV VACCINES  Aged Out  ? COLONOSCOPY (Pts 45-65yr Insurance coverage will need to be confirmed)  Discontinued  ? ? ?Objective: ?BP 126/71   Pulse 75   Temp 98 ?F (36.7 ?C) (Oral)   Resp 16   Ht 5' 4.5" (1.638 m)   Wt 119 lb (54 kg)   SpO2 100%   BMI 20.11 kg/m?  ? ? ?Physical Exam ?Constitutional:   ?   Appearance: Normal appearance. On a wheelchair  ?HENT:  ?   Head: Normocephalic and atraumatic.   ?   Mouth: Mucous membranes are moist.  ?Eyes: ?   Conjunctiva/sclera: Conjunctivae normal.  ?   ? ?Cardiovascular:  ?   Rate and Rhythm: Normal rate and regular rhythm.  ?   Heart sounds: ? ?Pulmonary:  ?   Effort: Pulmonary effort is normal.  ?   Breath sounds: Normal breath sounds.  ? ?Abdominal:  ?   General: Non distended  ?   Palpations: soft.  ? ?Musculoskeletal:     ?   General: Normal range of motion.  ? ?Skin: ?   General: Skin is warm and dry.  ?   Comments: ? ?Neurological:  ?   General: Incomplete quadriplegia, she cannot move rt leg but can move left leg somewhat and her upper extremities  ?   Mental Status: awake, alert and oriented to person, place, and time.  ? ?Psychiatric:     ?   Mood and Affect: Mood normal.  ? ?Lab Results ?Lab Results  ?Component Value Date  ? WBC 4.4 12/10/2019  ? HGB 13.4 12/10/2019  ? HCT 39.2 12/10/2019  ? MCV 95.1 12/10/2019  ? PLT 220.0 12/10/2019  ?  ?Lab Results  ?Component Value Date  ? CREATININE 0.71 12/10/2019   ? BUN 23 12/10/2019  ? NA 137 12/10/2019  ? K 3.7 12/10/2019  ? CL 100 12/10/2019  ? CO2 29 12/10/2019  ?  ?Lab Results  ?  Component Value Date  ? ALT 20 12/10/2019  ? AST 25 12/10/2019  ? ALKPHOS 55 12/10/2019  ? BILITOT 0.9 12/10/2019  ?  ?Lab Results  ?Component Value Date  ? CHOL 152 05/02/2020  ? HDL 70.70 05/02/2020  ? [place] 65 05/02/2020  ? LDLDIRECT 162.3 03/21/2012  ? TRIG 82.0 05/02/2020  ? CHOLHDL 2 05/02/2020  ? ?No results found for: LABRPR, RPRTITER ?No results found for: HIV1RNAQUANT, HIV1RNAVL, CD4TABS ?  ?Microbiology  ?Results for orders placed or performed in visit on 02/02/21  ?Culture, Urine     Status: None  ? Collection Time: 02/02/21  4:41 PM  ? Specimen: Urine  ?Result Value Ref Range Status  ? MICRO NUMBER: [id number]  Final  ? SPECIMEN QUALITY: Adequate  Final  ? Sample Source URINE  Final  ? STATUS: FINAL  Final  ? ISOLATE 1: Escherichia coli  Final  ?  Comment: Escherichia coli  ?    Susceptibility  ? Escherichia coli - URINE CULTURE, REFLEX  ?  AMOX/CLAVULANIC <=2 Sensitive   ?  AMPICILLIN >=32 Resistant   ?  AMPICILLIN/SULBACTAM <=2 Sensitive   ?  CEFAZOLIN* >=64 Resistant   ?   * For uncomplicated UTI caused by E. coli, ?K. pneumoniae or P. mirabilis: Cefazolin is ?susceptible if MIC <32 mcg/mL and predicts ?susceptible to the oral agents cefaclor, cefdinir, ?cefpodoxime, cefprozil, cefuroxime, cephalexin ?and loracarbef. ?  ?  CEFTAZIDIME <=1 Sensitive   ?  CEFEPIME <=1 Sensitive   ?  CEFTRIAXONE <=1 Sensitive   ?  CIPROFLOXACIN <=0.25 Sensitive   ?  LEVOFLOXACIN <=0.12 Sensitive   ?  GENTAMICIN <=1 Sensitive   ?  IMIPENEM <=0.25 Sensitive   ?  NITROFURANTOIN <=16 Sensitive   ?  PIP/TAZO <=4 Sensitive   ?  TOBRAMYCIN <=1 Sensitive   ?  TRIMETH/SULFA* <=20 Sensitive   ?   * For uncomplicated UTI caused by E. coli, ?K. pneumoniae or P. mirabilis: Cefazolin is ?susceptible if MIC <32 mcg/mL and predicts ?susceptible to the oral agents cefaclor, cefdinir, ?cefpodoxime, cefprozil,  cefuroxime, cephalexin ?and loracarbef. ?Legend: ?S = Susceptible  I = Intermediate ?R = Resistant  NS = Not susceptible ?* = Not tested  NR = Not reported ?**NN = See antimicrobic comments ?  ? ?3/14 urinary catheter gr

## 2021-10-11 DIAGNOSIS — N319 Neuromuscular dysfunction of bladder, unspecified: Secondary | ICD-10-CM | POA: Insufficient documentation

## 2021-10-11 DIAGNOSIS — Z1211 Encounter for screening for malignant neoplasm of colon: Secondary | ICD-10-CM | POA: Diagnosis not present

## 2021-10-11 DIAGNOSIS — R32 Unspecified urinary incontinence: Secondary | ICD-10-CM | POA: Insufficient documentation

## 2021-10-14 ENCOUNTER — Telehealth: Payer: Self-pay

## 2021-10-14 NOTE — Telephone Encounter (Signed)
[facility] (referring provider) called requesting last office note. Faxed to provider at [facility] [phone]). ? ?Beryle Flock, RN ? ?

## 2021-10-20 ENCOUNTER — Other Ambulatory Visit: Payer: Self-pay | Admitting: Internal Medicine

## 2021-10-20 DIAGNOSIS — Z1231 Encounter for screening mammogram for malignant neoplasm of breast: Secondary | ICD-10-CM

## 2021-10-22 LAB — COLOGUARD: COLOGUARD: NEGATIVE

## 2021-10-23 DIAGNOSIS — Z1231 Encounter for screening mammogram for malignant neoplasm of breast: Secondary | ICD-10-CM

## 2021-10-24 NOTE — Progress Notes (Signed)
Negative cologuard . Can repeat in 3 years

## 2021-10-27 ENCOUNTER — Ambulatory Visit
Admission: RE | Admit: 2021-10-27 | Discharge: 2021-10-27 | Disposition: A | Discharge status: Home / Self Care | Payer: Medicare PPO | Source: Ambulatory Visit | Attending: Internal Medicine | Admitting: Internal Medicine

## 2021-10-27 DIAGNOSIS — Z1231 Encounter for screening mammogram for malignant neoplasm of breast: Secondary | ICD-10-CM

## 2021-11-02 ENCOUNTER — Encounter: Payer: Self-pay | Admitting: Internal Medicine

## 2021-11-03 ENCOUNTER — Encounter: Payer: Self-pay | Admitting: Internal Medicine

## 2021-11-03 NOTE — Telephone Encounter (Signed)
Please compose a letter for her that it is medically necessary for the lift for her vehicle to be able to transport her wheelchair .

## 2021-11-04 ENCOUNTER — Encounter: Payer: Self-pay | Admitting: Internal Medicine

## 2021-11-10 ENCOUNTER — Encounter: Payer: Self-pay | Admitting: Internal Medicine

## 2021-11-10 ENCOUNTER — Other Ambulatory Visit: Payer: Self-pay | Admitting: Internal Medicine

## 2021-11-10 ENCOUNTER — Telehealth: Payer: Self-pay

## 2021-11-10 NOTE — Telephone Encounter (Signed)
Last Ov 01/14/21 Is it ok to refill?

## 2021-11-10 NOTE — Telephone Encounter (Signed)
What medication

## 2021-11-11 ENCOUNTER — Encounter: Payer: Self-pay | Admitting: Internal Medicine

## 2021-11-11 NOTE — Telephone Encounter (Signed)
Pt calling in checking on mirabegron ER (MYRBETRIQ) 25 MG TB24 tablet that was called in tomorrow.  [id number] [place] [place] - [address]:  [phone] Fax:  [phone]

## 2021-11-11 NOTE — Telephone Encounter (Signed)
I have never prescribed this medicine for her ; is on the med list as  historical.  Please advise who is the prescriber  ? If this is an error in sending this request to [place].     clarification  of pre scriber and  actual dosing on med list is needed .  The last notation from 2022  was that she was taking 50 mg of the Myrbetriq ER 25 mg..  ?    I would be happy to help in any way.

## 2021-11-12 MED ORDER — ZOLPIDEM TARTRATE 10 MG PO TABS: ORAL_TABLET | ORAL | 0 refills | Status: DC

## 2021-11-12 NOTE — Telephone Encounter (Signed)
Sent in electronically .  

## 2021-11-12 NOTE — Telephone Encounter (Signed)
MyChart message sent to patient.

## 2021-11-12 NOTE — Telephone Encounter (Signed)
Mychart message sent to patient.

## 2021-11-13 NOTE — Telephone Encounter (Signed)
Letter placed in mail.

## 2021-11-21 ENCOUNTER — Encounter: Payer: Self-pay | Admitting: Internal Medicine

## 2021-11-24 NOTE — Telephone Encounter (Signed)
Letter should have been done and sent  see May 23  message. That being said  Please send and I ok order for lift for her  [facility] for her vehicle  as requested

## 2021-11-25 ENCOUNTER — Other Ambulatory Visit: Payer: Self-pay

## 2021-11-30 DIAGNOSIS — J029 Acute pharyngitis, unspecified: Secondary | ICD-10-CM | POA: Diagnosis not present

## 2021-12-03 ENCOUNTER — Ambulatory Visit: Payer: Medicare PPO | Admitting: Podiatry

## 2021-12-03 ENCOUNTER — Other Ambulatory Visit: Payer: Self-pay

## 2021-12-03 DIAGNOSIS — M79675 Pain in left toe(s): Secondary | ICD-10-CM | POA: Diagnosis not present

## 2021-12-03 DIAGNOSIS — B351 Tinea unguium: Secondary | ICD-10-CM

## 2021-12-03 DIAGNOSIS — M79674 Pain in right toe(s): Secondary | ICD-10-CM

## 2021-12-03 DIAGNOSIS — G8254 Quadriplegia, C5-C7 incomplete: Secondary | ICD-10-CM

## 2021-12-03 MED ORDER — MUPIROCIN 2 % EX OINT: 1.0000 | TOPICAL_OINTMENT | Freq: Two times a day (BID) | CUTANEOUS | 2 refills | Status: AC

## 2021-12-08 ENCOUNTER — Encounter: Payer: Self-pay | Admitting: Podiatry

## 2021-12-08 NOTE — Progress Notes (Signed)
  Subjective:  Patient ID: [name], female    DOB: December 16, 1950,  MRN: [id number]  Chief Complaint  Patient presents with   nail trim    Nail trim    71 y.o. female returns for the above complaint.  Patient presents with thickened lining and dystrophic toenails x10.  Hurts with ambulation.  She states she is doing well.  The ulcers have healed.  She would like to have the nails debrided down.  She denies any other acute complaints  Objective:  There were no vitals filed for this visit. Podiatric Exam: Vascular: dorsalis pedis and posterior tibial pulses are palpable bilateral. Capillary return is immediate. Temperature gradient is WNL. Skin turgor WNL  Sensorium: Normal Semmes Weinstein monofilament test. Normal tactile sensation bilaterally. Nail Exam: Pt has thick disfigured discolored nails with subungual debris noted bilateral entire nail hallux through fifth toenails.  Pain on palpation to the nails. Ulcer Exam: There is no evidence of ulcer or pre-ulcerative changes or infection. Orthopedic Exam: Muscle tone and strength are WNL. No limitations in general ROM. No crepitus or effusions noted.  Skin: No Porokeratosis. No infection or ulcers    Assessment & Plan:   1. Pain due to onychomycosis of toenails of both feet     Patient was evaluated and treated and all questions answered.  Onychomycosis with pain  -Nails palliatively debrided as below. -Educated on self-care  Procedure: Nail Debridement Rationale: pain  Type of Debridement: manual, sharp debridement. Instrumentation: Nail nipper, rotary burr. Number of Nails: 10  Procedures and Treatment: Consent by patient was obtained for treatment procedures. The patient understood the discussion of treatment and procedures well. All questions were answered thoroughly reviewed. Debridement of mycotic and hypertrophic toenails, 1 through 5 bilateral and clearing of subungual debris. No ulceration, no infection noted.   Return Visit-Office Procedure: Patient instructed to return to the office for a follow up visit 3 months for continued evaluation and treatment.  Nicholes Rough, DPM    No follow-ups on file.

## 2021-12-13 ENCOUNTER — Encounter: Payer: Self-pay | Admitting: Internal Medicine

## 2021-12-16 ENCOUNTER — Other Ambulatory Visit: Payer: Self-pay

## 2021-12-16 DIAGNOSIS — G8250 Quadriplegia, unspecified: Secondary | ICD-10-CM | POA: Diagnosis not present

## 2021-12-16 DIAGNOSIS — G8254 Quadriplegia, C5-C7 incomplete: Secondary | ICD-10-CM | POA: Diagnosis not present

## 2021-12-16 NOTE — Progress Notes (Signed)
aspirin discontinued

## 2021-12-16 NOTE — Telephone Encounter (Signed)
aspirin discontinued

## 2021-12-16 NOTE — Telephone Encounter (Signed)
I agree   you can stop the asa for the above reason. ( Please take off her med list)

## 2021-12-21 DIAGNOSIS — G8254 Quadriplegia, C5-C7 incomplete: Secondary | ICD-10-CM | POA: Diagnosis not present

## 2021-12-21 DIAGNOSIS — G8250 Quadriplegia, unspecified: Secondary | ICD-10-CM | POA: Diagnosis not present

## 2021-12-23 ENCOUNTER — Encounter: Payer: Self-pay | Admitting: Infectious Diseases

## 2021-12-23 DIAGNOSIS — G8250 Quadriplegia, unspecified: Secondary | ICD-10-CM | POA: Diagnosis not present

## 2021-12-23 DIAGNOSIS — G8254 Quadriplegia, C5-C7 incomplete: Secondary | ICD-10-CM | POA: Diagnosis not present

## 2021-12-24 DIAGNOSIS — G8254 Quadriplegia, C5-C7 incomplete: Secondary | ICD-10-CM | POA: Diagnosis not present

## 2021-12-24 DIAGNOSIS — G8250 Quadriplegia, unspecified: Secondary | ICD-10-CM | POA: Diagnosis not present

## 2021-12-28 DIAGNOSIS — G8254 Quadriplegia, C5-C7 incomplete: Secondary | ICD-10-CM | POA: Diagnosis not present

## 2021-12-28 DIAGNOSIS — G8250 Quadriplegia, unspecified: Secondary | ICD-10-CM | POA: Diagnosis not present

## 2021-12-30 ENCOUNTER — Telehealth: Payer: Self-pay

## 2021-12-30 ENCOUNTER — Encounter: Payer: Self-pay | Admitting: Internal Medicine

## 2021-12-30 DIAGNOSIS — G8254 Quadriplegia, C5-C7 incomplete: Secondary | ICD-10-CM | POA: Diagnosis not present

## 2021-12-30 DIAGNOSIS — G8250 Quadriplegia, unspecified: Secondary | ICD-10-CM | POA: Diagnosis not present

## 2021-12-30 MED ORDER — NORTRIPTYLINE HCL 25 MG PO CAPS: 50.0000 mg | ORAL_CAPSULE | Freq: Every day | ORAL | 1 refills | Status: DC

## 2021-12-30 MED ORDER — GABAPENTIN 600 MG PO TABS: 1200.0000 mg | ORAL_TABLET | Freq: Three times a day (TID) | ORAL | 1 refills | Status: DC

## 2021-12-30 MED ORDER — ATORVASTATIN CALCIUM 20 MG PO TABS: 20.0000 mg | ORAL_TABLET | Freq: Every day | ORAL | 1 refills | Status: DC

## 2021-12-30 NOTE — Telephone Encounter (Signed)
Rx sent to the pharmacy.

## 2022-01-08 ENCOUNTER — Encounter: Payer: Medicare PPO | Admitting: Physical Medicine and Rehabilitation

## 2022-01-08 ENCOUNTER — Telehealth (INDEPENDENT_AMBULATORY_CARE_PROVIDER_SITE_OTHER): Payer: Medicare PPO | Admitting: Infectious Diseases

## 2022-01-08 ENCOUNTER — Other Ambulatory Visit: Payer: Self-pay

## 2022-01-08 DIAGNOSIS — R32 Unspecified urinary incontinence: Secondary | ICD-10-CM

## 2022-01-08 DIAGNOSIS — N319 Neuromuscular dysfunction of bladder, unspecified: Secondary | ICD-10-CM

## 2022-01-08 DIAGNOSIS — Z5181 Encounter for therapeutic drug level monitoring: Secondary | ICD-10-CM | POA: Diagnosis not present

## 2022-01-08 DIAGNOSIS — Z9359 Other cystostomy status: Secondary | ICD-10-CM | POA: Diagnosis not present

## 2022-01-08 DIAGNOSIS — N390 Urinary tract infection, site not specified: Secondary | ICD-10-CM | POA: Diagnosis not present

## 2022-01-08 MED ORDER — ASCORBIC ACID 1000 MG PO TABS: 1000.0000 mg | ORAL_TABLET | Freq: Four times a day (QID) | ORAL | 3 refills | Status: DC

## 2022-01-08 MED ORDER — METHENAMINE HIPPURATE 1 G PO TABS
1.0000 g | ORAL_TABLET | Freq: Two times a day (BID) | ORAL | 3 refills | Status: DC
Start: 2022-01-08 — End: 2022-03-08

## 2022-01-08 NOTE — Progress Notes (Signed)
Virtual Visit via Video Note  I connected withNAME@ on 01/08/22 at 11:00 AM EDT by a video enabled telemedicine application and verified that I am speaking with the correct person using two identifiers.  Location: Patient: Home  Provider: [facility]   I discussed the limitations of evaluation and management by telemedicine and the availability of in person appointments. The patient expressed understanding and agreed to proceed.  [place]ious [facility]  Patient Active Problem List   Diagnosis Date Noted   Neurogenic bladder 10/11/2021   Urinary incontinence 10/11/2021   ESBL (extended spectrum beta-lactamase) producing bacteria infection 10/09/2021   Recurrent UTI 10/09/2021   Quadriplegia, C5-C7 incomplete ([place]) 01/16/2021   History of spinal fracture 01/16/2021   Suprapubic catheter ([place]) 01/16/2021   Encounter for routine gynecological examination 09/28/2013   Onychomycosis 09/28/2013   Foot deformity, acquired 03/26/2012   Encounter for preventive health examination 12/25/2010   ROSACEA 08/25/2009   Disturbance in sleep behavior 03/11/2008   SKIN CANCER, HX OF 03/11/2008   DYSURIA, HX OF 03/11/2008   Hyperlipidemia 02/10/2007   CERVICALGIA 02/10/2007    Patient's Medications  New Prescriptions   No medications on file  Previous Medications   ASCORBIC ACID (VITAMIN C) 1000 MG TABLET    Take 1 tablet (1,000 mg total) by mouth in the morning, at noon, in the evening, and at bedtime.   ATORVASTATIN (LIPITOR) 20 MG TABLET    Take 1 tablet (20 mg total) by mouth daily.   BACLOFEN (LIORESAL) 20 MG TABLET    TAKE 1&1/2 TABLETS BY MOUTH EVERY MORNING, 1 TABLET IN THE EVENING AND 1&1/2TABS AT BEDTIME   CHOLECALCIFEROL (VITAMIN D3) 25 MCG (1000 UNIT) TABLET    Take 1,000 Units by mouth daily. 2000u   CRANBERRY 200 MG CAPS    Take by mouth.   [facility] COENZYME Q-10 100 MG CAPSULE    Take 100 mg by mouth daily.    DOCUSATE SODIUM (DSS) 100 MG CAPS    Take by mouth.    FESOTERODINE (TOVIAZ) 8 MG TB24 TABLET    Take 8 mg by mouth daily.   GABAPENTIN (NEURONTIN) 600 MG TABLET    Take 2 tablets (1,200 mg total) by mouth 3 (three) times daily.   METHENAMINE (HIPREX) 1 G TABLET    Take 1 tablet (1 g total) by mouth 2 (two) times daily with a meal.   METRONIDAZOLE (METROGEL) 1 % GEL    APPLY TOPICALLY EVERY DAY   MIRABEGRON ER (MYRBETRIQ) 25 MG TB24 TABLET    Take 50 mg by mouth.   MULTIPLE VITAMINS-MINERALS (CENTRUM SILVER ULTRA WOMENS PO)    Take by mouth.   MUPIROCIN OINTMENT (BACTROBAN) 2 %    Apply 1 Application topically 2 (two) times daily.   NORTRIPTYLINE (PAMELOR) 25 MG CAPSULE    Take 2 capsules (50 mg total) by mouth daily.   SENNA CO    by Combination route.   TIZANIDINE (ZANAFLEX) 2 MG CAPSULE    Take 2 mg by mouth daily.   UREA (CARMOL) 40 % CREA    Apply daily to callused areas of feet   ZOLPIDEM (AMBIEN) 10 MG TABLET    TAKE 1/2-1 TABLET BY MOUTH AT BEDTIME AS NEEDED FOR SLEEP  Modified Medications   No medications on file  Discontinued Medications   No medications on file    History of Present Illness: Here for follow up for recurrent UTIs. Denies UTIs since last clinic visit or taking antibiotics. She has been  taking methenamine twice a day and Vitamin C 4 times a day as instructed. She tells me she has seen Urologist once or twice after she last saw me for catheter change. No new GU symptoms currently. She is asking for refill of medications and also concerned about long term side effects with methenamine. Doing well with no concerns otherwise   ROS all systems reviewed with pertinent positives and negatives as listed above  Past Medical History:  Diagnosis Date   CERVICAL POLYP 03/11/2008   Qualifier: Diagnosis of  By: Regis Bill MD, Standley Brooking    Colon polyps 2005   on colonscopy Dr. Fuller Plan   Fibroid 2004   Per Dr. Ouida Sills   History of shingles    face and mouth   Hx of skin cancer, basal cell    Rosacea    Sciatica of left side 09/28/2013    Scoliosis    noted on mri done for back pain   Past Surgical History:  Procedure Laterality Date   BUNIONECTOMY      Social History   Tobacco Use   Smoking status: Never   Smokeless tobacco: Never  Vaping Use   Vaping Use: Never used  Substance Use Topics   Alcohol use: Yes    Family History  Problem Relation Age of Onset   Hypertension Father    Osteoporosis Other    Breast cancer Neg Hx     No Known Allergies  Health Maintenance  Topic Date Due   TETANUS/TDAP  06/28/2016   COVID-19 Vaccine (3 - Pfizer risk series) 03/20/2021   INFLUENZA VACCINE  01/12/2022   MAMMOGRAM  10/28/2023   Fecal DNA (Cologuard)  10/11/2024   Pneumonia Vaccine 92+ Years old  Completed   DEXA SCAN  Completed   Hepatitis C Screening  Completed   Zoster Vaccines- Shingrix  Completed   HPV VACCINES  Aged Out   COLONOSCOPY (Pts 45-69yr Insurance coverage will need to be confirmed)  Discontinued    Observations/Objective: Able to speak fluently  Not in acute distress and appears comfortable   Assessment and Plan: # Recurrent UTI - continue methenamine and vitamin C as is. No UTIs since last visit in 4/28 - Medications refilled - Plan for CMP in next clinic visit in 3 months   # Incomplete Quadriplegia  # [facility] Urinary Incontinence On Fesoterodine and Mirabegron Follows Urology   Follow Up Instructions: 3 months    I discussed the assessment and treatment plan with the patient. The patient was provided an opportunity to ask questions and all were answered. The patient agreed with the plan and demonstrated an understanding of the instructions.   The patient was advised to call back or seek an in-person evaluation if the symptoms worsen or if the condition fails to improve as anticipated.  I provided 35 minutes of non-face-to-face time during this encounter.  SWilber Oliphant MChesterfor Infectious DAntelopeGroup [phone]   [phone] 01/08/2022, 10:56 AM

## 2022-01-13 DIAGNOSIS — G8254 Quadriplegia, C5-C7 incomplete: Secondary | ICD-10-CM | POA: Diagnosis not present

## 2022-01-13 DIAGNOSIS — G8250 Quadriplegia, unspecified: Secondary | ICD-10-CM | POA: Diagnosis not present

## 2022-01-15 DIAGNOSIS — G8254 Quadriplegia, C5-C7 incomplete: Secondary | ICD-10-CM | POA: Diagnosis not present

## 2022-01-15 DIAGNOSIS — G8250 Quadriplegia, unspecified: Secondary | ICD-10-CM | POA: Diagnosis not present

## 2022-01-18 DIAGNOSIS — G8254 Quadriplegia, C5-C7 incomplete: Secondary | ICD-10-CM | POA: Diagnosis not present

## 2022-01-18 DIAGNOSIS — G8250 Quadriplegia, unspecified: Secondary | ICD-10-CM | POA: Diagnosis not present

## 2022-01-21 DIAGNOSIS — G8254 Quadriplegia, C5-C7 incomplete: Secondary | ICD-10-CM | POA: Diagnosis not present

## 2022-01-21 DIAGNOSIS — G8250 Quadriplegia, unspecified: Secondary | ICD-10-CM | POA: Diagnosis not present

## 2022-01-22 DIAGNOSIS — G8250 Quadriplegia, unspecified: Secondary | ICD-10-CM | POA: Diagnosis not present

## 2022-01-22 DIAGNOSIS — G8254 Quadriplegia, C5-C7 incomplete: Secondary | ICD-10-CM | POA: Diagnosis not present

## 2022-01-25 DIAGNOSIS — G8254 Quadriplegia, C5-C7 incomplete: Secondary | ICD-10-CM | POA: Diagnosis not present

## 2022-01-25 DIAGNOSIS — G8250 Quadriplegia, unspecified: Secondary | ICD-10-CM | POA: Diagnosis not present

## 2022-01-27 DIAGNOSIS — G8250 Quadriplegia, unspecified: Secondary | ICD-10-CM | POA: Diagnosis not present

## 2022-01-27 DIAGNOSIS — G8254 Quadriplegia, C5-C7 incomplete: Secondary | ICD-10-CM | POA: Diagnosis not present

## 2022-02-01 DIAGNOSIS — G8254 Quadriplegia, C5-C7 incomplete: Secondary | ICD-10-CM | POA: Diagnosis not present

## 2022-02-01 DIAGNOSIS — G8250 Quadriplegia, unspecified: Secondary | ICD-10-CM | POA: Diagnosis not present

## 2022-02-02 DIAGNOSIS — G8250 Quadriplegia, unspecified: Secondary | ICD-10-CM | POA: Diagnosis not present

## 2022-02-02 DIAGNOSIS — G8254 Quadriplegia, C5-C7 incomplete: Secondary | ICD-10-CM | POA: Diagnosis not present

## 2022-02-08 DIAGNOSIS — G8254 Quadriplegia, C5-C7 incomplete: Secondary | ICD-10-CM | POA: Diagnosis not present

## 2022-02-08 DIAGNOSIS — G8250 Quadriplegia, unspecified: Secondary | ICD-10-CM | POA: Diagnosis not present

## 2022-02-11 DIAGNOSIS — G8250 Quadriplegia, unspecified: Secondary | ICD-10-CM | POA: Diagnosis not present

## 2022-02-11 DIAGNOSIS — G8254 Quadriplegia, C5-C7 incomplete: Secondary | ICD-10-CM | POA: Diagnosis not present

## 2022-02-16 DIAGNOSIS — G8250 Quadriplegia, unspecified: Secondary | ICD-10-CM | POA: Diagnosis not present

## 2022-02-16 DIAGNOSIS — G8254 Quadriplegia, C5-C7 incomplete: Secondary | ICD-10-CM | POA: Diagnosis not present

## 2022-02-17 DIAGNOSIS — G8254 Quadriplegia, C5-C7 incomplete: Secondary | ICD-10-CM | POA: Diagnosis not present

## 2022-02-17 DIAGNOSIS — G8250 Quadriplegia, unspecified: Secondary | ICD-10-CM | POA: Diagnosis not present

## 2022-02-19 DIAGNOSIS — G8254 Quadriplegia, C5-C7 incomplete: Secondary | ICD-10-CM | POA: Diagnosis not present

## 2022-02-19 DIAGNOSIS — N319 Neuromuscular dysfunction of bladder, unspecified: Secondary | ICD-10-CM | POA: Diagnosis not present

## 2022-02-19 DIAGNOSIS — R338 Other retention of urine: Secondary | ICD-10-CM | POA: Diagnosis not present

## 2022-02-26 ENCOUNTER — Telehealth: Payer: Self-pay

## 2022-02-26 NOTE — Telephone Encounter (Signed)
Unsuccessful attempt to reach patient on preferred number listed in notes for scheduled AWV. Left message on voicemail okay to reschedule. 
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
## 2022-03-02 ENCOUNTER — Ambulatory Visit (INDEPENDENT_AMBULATORY_CARE_PROVIDER_SITE_OTHER): Payer: Medicare PPO

## 2022-03-02 VITALS — Ht 64.0 in | Wt 135.0 lb

## 2022-03-02 DIAGNOSIS — Z0000 Encounter for general adult medical examination without abnormal findings: Secondary | ICD-10-CM

## 2022-03-02 NOTE — Patient Instructions (Signed)
[name] , Thank you for taking time to come for your Medicare Wellness Visit. I appreciate your ongoing commitment to your health goals. Please review the following plan we discussed and let me know if I can assist you in the future.   Screening recommendations/referrals: Colonoscopy: cologuard 10/11/2021, due 10/11/2024 Mammogram: completed 10/27/2021, due 10/29/2022 Bone Density: completed 12/07/2018 Recommended yearly ophthalmology/optometry visit for glaucoma screening and checkup Recommended yearly dental visit for hygiene and checkup  Vaccinations: Influenza vaccine: due Pneumococcal vaccine: completed 12/18/2018 Tdap vaccine: due Shingles vaccine: completed   Covid-19: scheduled for new booster  Advanced directives: Please bring a copy of your POA (Power of [place]) and/or Living Will to your next appointment.   Conditions/risks identified: none  Next appointment: Follow up in one year for your annual wellness visit    Preventive Care 70 Years and Older, Female Preventive care refers to lifestyle choices and visits with your health care provider that can promote health and wellness. What does preventive care include? A yearly physical exam. This is also called an annual well check. Dental exams once or twice a year. Routine eye exams. Ask your health care provider how often you should have your eyes checked. Personal lifestyle choices, including: Daily care of your teeth and gums. Regular physical activity. Eating a healthy diet. Avoiding tobacco and drug use. Limiting alcohol use. Practicing safe sex. Taking low-dose aspirin every day. Taking vitamin and mineral supplements as recommended by your health care provider. What happens during an annual well check? The services and screenings done by your health care provider during your annual well check will depend on your age, overall health, lifestyle risk factors, and family history of disease. Counseling  Your health  care provider may ask you questions about your: Alcohol use. Tobacco use. Drug use. Emotional well-being. Home and relationship well-being. Sexual activity. Eating habits. History of falls. Memory and ability to understand (cognition). Work and work Statistician. Reproductive health. Screening  You may have the following tests or measurements: Height, weight, and BMI. Blood pressure. Lipid and cholesterol levels. These may be checked every 5 years, or more frequently if you are over 65 years old. Skin check. Lung cancer screening. You may have this screening every year starting at age 81 if you have a 30-pack-year history of smoking and currently smoke or have quit within the past 15 years. Fecal occult blood test (FOBT) of the stool. You may have this test every year starting at age 97. Flexible sigmoidoscopy or colonoscopy. You may have a sigmoidoscopy every 5 years or a colonoscopy every 10 years starting at age 38. Hepatitis C blood test. Hepatitis B blood test. Sexually transmitted disease (STD) testing. Diabetes screening. This is done by checking your blood sugar (glucose) after you have not eaten for a while (fasting). You may have this done every 1-3 years. Bone density scan. This is done to screen for osteoporosis. You may have this done starting at age 45. Mammogram. This may be done every 1-2 years. Talk to your health care provider about how often you should have regular mammograms. Talk with your health care provider about your test results, treatment options, and if necessary, the need for more tests. Vaccines  Your health care provider may recommend certain vaccines, such as: Influenza vaccine. This is recommended every year. Tetanus, diphtheria, and acellular pertussis (Tdap, Td) vaccine. You may need a Td booster every 10 years. Zoster vaccine. You may need this after age 48. Pneumococcal 13-valent conjugate (PCV13) vaccine. One  dose is recommended after age  21. Pneumococcal polysaccharide (PPSV23) vaccine. One dose is recommended after age 20. Talk to your health care provider about which screenings and vaccines you need and how often you need them. This information is not intended to replace advice given to you by your health care provider. Make sure you discuss any questions you have with your health care provider. Document Released: 06/27/2015 Document Revised: 02/18/2016 Document Reviewed: 04/01/2015 Elsevier Interactive Patient Education  2017 [place] Prevention in the Home Falls can cause injuries. They can happen to people of all ages. There are many things you can do to make your home safe and to help prevent falls. What can I do on the outside of my home? Regularly fix the edges of walkways and driveways and fix any cracks. Remove anything that might make you trip as you walk through a door, such as a raised step or threshold. Trim any bushes or trees on the path to your home. Use bright outdoor lighting. Clear any walking paths of anything that might make someone trip, such as rocks or tools. Regularly check to see if handrails are loose or broken. Make sure that both sides of any steps have handrails. Any raised decks and porches should have guardrails on the edges. Have any leaves, snow, or ice cleared regularly. Use sand or salt on walking paths during winter. Clean up any spills in your garage right away. This includes oil or grease spills. What can I do in the bathroom? Use night lights. Install grab bars by the toilet and in the tub and shower. Do not use towel bars as grab bars. Use non-skid mats or decals in the tub or shower. If you need to sit down in the shower, use a plastic, non-slip stool. Keep the floor dry. Clean up any water that spills on the floor as soon as it happens. Remove soap buildup in the tub or shower regularly. Attach bath mats securely with double-sided non-slip rug tape. Do not have throw  rugs and other things on the floor that can make you trip. What can I do in the bedroom? Use night lights. Make sure that you have a light by your bed that is easy to reach. Do not use any sheets or blankets that are too big for your bed. They should not hang down onto the floor. Have a firm chair that has side arms. You can use this for support while you get dressed. Do not have throw rugs and other things on the floor that can make you trip. What can I do in the kitchen? Clean up any spills right away. Avoid walking on wet floors. Keep items that you use a lot in easy-to-reach places. If you need to reach something above you, use a strong step stool that has a grab bar. Keep electrical cords out of the way. Do not use floor polish or wax that makes floors slippery. If you must use wax, use non-skid floor wax. Do not have throw rugs and other things on the floor that can make you trip. What can I do with my stairs? Do not leave any items on the stairs. Make sure that there are handrails on both sides of the stairs and use them. Fix handrails that are broken or loose. Make sure that handrails are as long as the stairways. Check any carpeting to make sure that it is firmly attached to the stairs. Fix any carpet that is loose or worn.  Avoid having throw rugs at the top or bottom of the stairs. If you do have throw rugs, attach them to the floor with carpet tape. Make sure that you have a light switch at the top of the stairs and the bottom of the stairs. If you do not have them, ask someone to add them for you. What else can I do to help prevent falls? Wear shoes that: Do not have high heels. Have rubber bottoms. Are comfortable and fit you well. Are closed at the toe. Do not wear sandals. If you use a stepladder: Make sure that it is fully opened. Do not climb a closed stepladder. Make sure that both sides of the stepladder are locked into place. Ask someone to hold it for you, if  possible. Clearly mark and make sure that you can see: Any grab bars or handrails. First and last steps. Where the edge of each step is. Use tools that help you move around (mobility aids) if they are needed. These include: Canes. Walkers. Scooters. Crutches. Turn on the lights when you go into a dark area. Replace any light bulbs as soon as they burn out. Set up your furniture so you have a clear path. Avoid moving your furniture around. If any of your floors are uneven, fix them. If there are any pets around you, be aware of where they are. Review your medicines with your doctor. Some medicines can make you feel dizzy. This can increase your chance of falling. Ask your doctor what other things that you can do to help prevent falls. This information is not intended to replace advice given to you by your health care provider. Make sure you discuss any questions you have with your health care provider. Document Released: 03/27/2009 Document Revised: 11/06/2015 Document Reviewed: 07/05/2014 Elsevier Interactive Patient Education  2017 [employer].

## 2022-03-02 NOTE — Progress Notes (Signed)
I connected with [name] today by telephone and verified that I am speaking with the correct person using two identifiers. Location patient: home Location provider: work Persons participating in the virtual visit: Zyasia, Halble[place] LPN.   I discussed the limitations, risks, security and privacy concerns of performing an evaluation and management service by telephone and the availability of in person appointments. I also discussed with the patient that there may be a patient responsible charge related to this service. The patient expressed understanding and verbally consented to this telephonic visit.    Interactive audio and video telecommunications were attempted between this provider and patient, however failed, due to patient having technical difficulties OR patient did not have access to video capability.  We continued and completed visit with audio only.     Vital signs may be patient reported or missing.  Subjective:   [name] is a 71 y.o. female who presents for Medicare Annual (Subsequent) preventive examination.  Review of Systems     Cardiac Risk Factors include: advanced age (>11mn, >>89women);dyslipidemia     Objective:    Today's Vitals   03/02/22 1156 03/02/22 1159  Weight: 135 lb (61.2 kg)   Height: '5\' 4"'$  (1.626 m)   PainSc: 4  4    Body mass index is 23.17 kg/m.     03/02/2022   12:03 PM 03/13/2021   12:49 PM 02/24/2021    2:40 PM 02/19/2021    2:30 PM  Advanced Directives  Does Patient Have a Medical Advance Directive? Yes Yes Yes Yes  Type of AParamedicof [place]uncoast EstatesLiving will HR[place]Living will HM[place]Living will HC[place] Chart? No - copy requested No - copy requested No - copy requested     Current Medications (verified) Outpatient Encounter Medications as of 03/02/2022  Medication Sig   ascorbic  acid (VITAMIN C) 1000 MG tablet Take 1 tablet (1,000 mg total) by mouth in the morning, at noon, in the evening, and at bedtime.   atorvastatin (LIPITOR) 20 MG tablet Take 1 tablet (20 mg total) by mouth daily.   baclofen (LIORESAL) 20 MG tablet TAKE 1&1/2 TABLETS BY MOUTH EVERY MORNING, 1 TABLET IN THE EVENING AND 1&1/2TABS AT BEDTIME   cholecalciferol (VITAMIN D3) 25 MCG (1000 UNIT) tablet Take 1,000 Units by mouth daily. 2000u   Cranberry 200 MG CAPS Take by mouth.   [employer] COENZYME Q-10 100 MG capsule Take 100 mg by mouth daily.    Docusate Sodium (DSS) 100 MG CAPS Take by mouth.   fesoterodine (TOVIAZ) 8 MG TB24 tablet Take 8 mg by mouth daily.   gabapentin (NEURONTIN) 600 MG tablet Take 2 tablets (1,200 mg total) by mouth 3 (three) times daily.   methenamine (HIPREX) 1 g tablet Take 1 tablet (1 g total) by mouth 2 (two) times daily with a meal.   metroNIDAZOLE (METROGEL) 1 % gel APPLY TOPICALLY EVERY DAY   mirabegron ER (MYRBETRIQ) 25 MG TB24 tablet Take 50 mg by mouth.   Multiple Vitamins-Minerals (CENTRUM SILVER ULTRA WOMENS PO) Take by mouth.   mupirocin ointment (BACTROBAN) 2 % Apply 1 Application topically 2 (two) times daily.   nortriptyline (PAMELOR) 25 MG capsule Take 2 capsules (50 mg total) by mouth daily.   SENNA CO by Combination route.   tizanidine (ZANAFLEX) 2 MG capsule Take 2 mg by mouth daily.   urea (CARMOL) 40 % CREA  Apply daily to callused areas of feet   zolpidem (AMBIEN) 10 MG tablet TAKE 1/2-1 TABLET BY MOUTH AT BEDTIME AS NEEDED FOR SLEEP   No facility-administered encounter medications on file as of 03/02/2022.    Allergies (verified) Patient has no known allergies.   History: Past Medical History:  Diagnosis Date   CERVICAL POLYP 03/11/2008   Qualifier: Diagnosis of  By: Regis Bill MD, Standley Brooking    Colon polyps 2005   on colonscopy Dr. Fuller Plan   Fibroid 2004   Per Dr. Ouida Sills   History of shingles    face and mouth   Hx of skin cancer, basal cell    Rosacea     Sciatica of left side 09/28/2013   Scoliosis    noted on mri done for back pain   Past Surgical History:  Procedure Laterality Date   BUNIONECTOMY     Family History  Problem Relation Age of Onset   Hypertension Father    Osteoporosis Other    Breast cancer Neg Hx    Social History   Socioeconomic History   Marital status: Married    Spouse name: Not on file   Number of children: Not on file   Years of education: Not on file   Highest education level: Not on file  Occupational History   Not on file  Tobacco Use   Smoking status: Never   Smokeless tobacco: Never  Vaping Use   Vaping Use: Never used  Substance and Sexual Activity   Alcohol use: Not Currently    Alcohol/week: 7.0 standard drinks of alcohol    Types: 7 Glasses of wine per week   Drug use: Not on file   Sexual activity: Not on file  Other Topics Concern   Not on file  Social History Narrative   Married   Spouse had CABG    [employer]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   Has moved to [place]   hh of 2    2 cats      Social Determinants of Health   Financial Resource Strain: Low Risk  (03/02/2022)   Overall Financial Resource Strain (CARDIA)    Difficulty of Paying Living Expenses: Not hard at all  Food Insecurity: No Food Insecurity (03/02/2022)   Hunger Vital Sign    Worried About Running Out of Food in the Last Year: Never true    Ran Out of Food in the Last Year: Never true  Transportation Needs: No Transportation Needs (03/02/2022)   PRAPARE - Hydrologist (Medical): No    Lack of Transportation (Non-Medical): No  Physical Activity: Inactive (03/02/2022)   Exercise Vital Sign    Days of Exercise per Week: 0 days    Minutes of Exercise per Session: 0 min  Stress: No Stress Concern Present (02/24/2021)   [place]    Feeling of Stress : Not at all  Social Connections: Moderately Integrated (02/24/2021)    Social Connection and Isolation Panel [NHANES]    Frequency of Communication with Friends and Family: More than three times a week    Frequency of Social Gatherings with Friends and Family: More than three times a week    Attends Religious Services: Never    Marine scientist or Organizations: Yes    Attends Archivist Meetings: 1 to 4 times per year    Marital Status: Married    Tobacco  Counseling Counseling given: Not Answered   Clinical Intake:  Pre-visit preparation completed: Yes  Pain : 0-10 Pain Score: 4  Pain Type: Chronic pain Pain Location: Arm Pain Orientation: Left, Right Pain Descriptors / Indicators: Throbbing Pain Onset: More than a month ago Pain Frequency: Constant     Nutritional Status: BMI of 19-24  Normal Nutritional Risks: None Diabetes: No  How often do you need to have someone help you when you read instructions, pamphlets, or other written materials from your doctor or pharmacy?: 1 - Never What is the last grade level you completed in school?: PhD  Diabetic? no  Interpreter Needed?: No  Information entered by :: NAllen LPN   Activities of Daily Living    03/02/2022   12:05 PM 03/01/2022   10:38 AM  In your present state of health, do you have any difficulty performing the following activities:  Hearing? 0 0  Vision? 0 0  Difficulty concentrating or making decisions? 0 0  Walking or climbing stairs? 1 1  Dressing or bathing? 1 1  Doing errands, shopping? 1 1  Preparing Food and eating ? Y Y  Using the Toilet? Y Y  In the past six months, have you accidently leaked urine? Y Y  Do you have problems with loss of bowel control? Y Y  Managing your Medications? N N  Comment caregiver sets up pill box   Managing your Finances? N N  Housekeeping or managing your Housekeeping? Tempie Donning    Patient Care Team: Burnis Medin, MD as PCP - General Olga Millers, MD (Obstetrics and Gynecology) Ladene Artist, MD  (Gastroenterology) Dia Crawford, Jess Barters, MD (Inactive)  Indicate any recent Medical Services you may have received from other than [facility] providers in the past year (date may be approximate).     Assessment:   This is a routine wellness examination for Margery.  Hearing/Vision screen Vision Screening - Comments:: Regular eye exams, [facility]  Dietary issues and exercise activities discussed: Current Exercise Habits: The patient does not participate in regular exercise at present   Goals Addressed             This Visit's Progress    Patient Stated       03/02/2022 wants to lose 15 pounds       Depression Screen    03/02/2022   12:05 PM 10/09/2021    1:51 PM 02/24/2021    2:39 PM 12/10/2019    2:20 PM 04/14/2017    8:20 AM 09/28/2013   11:40 AM  PHQ 2/9 Scores  PHQ - 2 Score 0 0 0 0 0 0  PHQ- 9 Score    0      Fall Risk    03/02/2022   12:04 PM 03/01/2022   10:38 AM 02/24/2022   10:33 AM 10/09/2021    1:51 PM 02/24/2021    2:41 PM  [place] in the past year? 0 0 0 0 0  Number falls in past yr: 0 0 0 0 0  Injury with Fall? 0 0 0 0 0  Risk for fall due to : Medication side effect    Impaired vision;Impaired mobility  Follow up Falls prevention discussed;Education provided;Falls evaluation completed    Falls prevention discussed    FALL RISK PREVENTION PERTAINING TO THE HOME:  Any stairs in or around the home? No  If so, are there any without handrails?  ramp Home free of loose throw rugs in walkways,  pet beds, electrical cords, etc? Yes  Adequate lighting in your home to reduce risk of falls? Yes   ASSISTIVE DEVICES UTILIZED TO PREVENT FALLS:  Life alert? No  Use of a cane, walker or w/c? Yes  Grab bars in the bathroom? Yes  Shower chair or bench in shower? Yes  Elevated toilet seat or a handicapped toilet? No   TIMED UP AND GO:  Was the test performed? No .      Cognitive Function:        Immunizations Immunization History   Administered Date(s) Administered   Influenza Inj Mdck Quad Pf 06/25/2016   Influenza Split 03/21/2012   Influenza Whole 03/11/2008   Influenza, High Dose Seasonal PF 04/14/2017, 05/28/2018, 03/10/2020   Influenza-Unspecified 05/28/2018, 02/20/2021   PFIZER(Purple Top)SARS-COV-2 Vaccination 10/03/2020   Pfizer Covid-19 Vaccine Bivalent Booster 19yr & up 02/20/2021   Pneumococcal Conjugate-13 04/14/2017   Pneumococcal Polysaccharide-23 12/18/2018   Td 06/28/2006   Typhoid Live 11/29/2014   Zoster Recombinat (Shingrix) 05/28/2018, 11/29/2018   Zoster, Live 03/21/2012    TDAP status: Due, Education has been provided regarding the importance of this vaccine. Advised may receive this vaccine at local pharmacy or Health Dept. Aware to provide a copy of the vaccination record if obtained from local pharmacy or Health Dept. Verbalized acceptance and understanding.  Flu Vaccine status: Due, Education has been provided regarding the importance of this vaccine. Advised may receive this vaccine at local pharmacy or Health Dept. Aware to provide a copy of the vaccination record if obtained from local pharmacy or Health Dept. Verbalized acceptance and understanding.  Pneumococcal vaccine status: Up to date  Covid-19 vaccine status: Completed vaccines  Qualifies for Shingles Vaccine? Yes   Zostavax completed Yes   Shingrix Completed?: Yes  Screening Tests Health Maintenance  Topic Date Due   TETANUS/TDAP  06/28/2016   COVID-19 Vaccine (3 - Pfizer risk series) 03/20/2021   INFLUENZA VACCINE  01/12/2022   MAMMOGRAM  10/28/2023   Fecal DNA (Cologuard)  10/11/2024   Pneumonia Vaccine 70 Years old  Completed   DEXA SCAN  Completed   Hepatitis C Screening  Completed   Zoster Vaccines- Shingrix  Completed   HPV VACCINES  Aged Out   COLONOSCOPY (Pts 45-456yrInsurance coverage will need to be confirmed)  Discontinued    Health Maintenance  Health Maintenance Due  Topic Date Due    TETANUS/TDAP  06/28/2016   COVID-19 Vaccine (3 - Pfizer risk series) 03/20/2021   INFLUENZA VACCINE  01/12/2022    Colorectal cancer screening: Type of screening: Cologuard. Completed 10/11/2021. Repeat every 3 years  Mammogram status: Completed 10/27/2021. Repeat every year  Bone Density status: Completed 12/07/2018.   Lung Cancer Screening: (Low Dose CT Chest recommended if Age 70-80ears, 30 pack-year currently smoking OR have quit w/in 15years.) does not qualify.   Lung Cancer Screening Referral: no  Additional Screening:  Hepatitis C Screening: does qualify; Completed 12/07/2018  Vision Screening: Recommended annual ophthalmology exams for early detection of glaucoma and other disorders of the eye. Is the patient up to date with their annual eye exam?  Yes  Who is the provider or what is the name of the office in which the patient attends annual eye exams? [facility] If pt is not established with a provider, would they like to be referred to a provider to establish care? No .   Dental Screening: Recommended annual dental exams for proper oral hygiene  Community Resource Referral / Chronic Care Management: CRR  required this visit?  No   CCM required this visit?  No      Plan:     I have personally reviewed and noted the following in the patient's chart:   Medical and social history Use of alcohol, tobacco or illicit drugs  Current medications and supplements including opioid prescriptions. Patient is not currently taking opioid prescriptions. Functional ability and status Nutritional status Physical activity Advanced directives List of other physicians Hospitalizations, surgeries, and ER visits in previous 12 months Vitals Screenings to include cognitive, depression, and falls Referrals and appointments  In addition, I have reviewed and discussed with patient certain preventive protocols, quality metrics, and best practice recommendations. A written personalized  care plan for preventive services as well as general preventive health recommendations were provided to patient.     Kellie Simmering, LPN   3/41/9622   Nurse Notes: 6 CIT not administered. Patient states she has no cognitive problems. She passed on this.  Due to this being a virtual visit, the after visit summary with patients personalized plan was offered to patient via mail or my-chart. Patient would like to access on my-chart

## 2022-03-08 ENCOUNTER — Encounter: Payer: Self-pay | Admitting: Internal Medicine

## 2022-03-08 ENCOUNTER — Ambulatory Visit: Payer: Medicare PPO | Admitting: Infectious Diseases

## 2022-03-08 ENCOUNTER — Ambulatory Visit: Payer: Medicare PPO | Admitting: Internal Medicine

## 2022-03-08 ENCOUNTER — Other Ambulatory Visit: Payer: Self-pay

## 2022-03-08 ENCOUNTER — Encounter: Payer: Self-pay | Admitting: Infectious Diseases

## 2022-03-08 VITALS — BP 132/80 | HR 93 | Resp 16 | Ht 64.0 in | Wt 135.0 lb

## 2022-03-08 VITALS — BP 128/78 | HR 61 | Temp 98.8°F | Wt 135.0 lb

## 2022-03-08 DIAGNOSIS — R252 Cramp and spasm: Secondary | ICD-10-CM | POA: Diagnosis not present

## 2022-03-08 DIAGNOSIS — Z87440 Personal history of urinary (tract) infections: Secondary | ICD-10-CM | POA: Diagnosis not present

## 2022-03-08 DIAGNOSIS — Z8781 Personal history of (healed) traumatic fracture: Secondary | ICD-10-CM | POA: Diagnosis not present

## 2022-03-08 DIAGNOSIS — N319 Neuromuscular dysfunction of bladder, unspecified: Secondary | ICD-10-CM

## 2022-03-08 DIAGNOSIS — Z79899 Other long term (current) drug therapy: Secondary | ICD-10-CM | POA: Diagnosis not present

## 2022-03-08 DIAGNOSIS — Z9359 Other cystostomy status: Secondary | ICD-10-CM

## 2022-03-08 DIAGNOSIS — N390 Urinary tract infection, site not specified: Secondary | ICD-10-CM | POA: Diagnosis not present

## 2022-03-08 DIAGNOSIS — G8254 Quadriplegia, C5-C7 incomplete: Secondary | ICD-10-CM | POA: Diagnosis not present

## 2022-03-08 DIAGNOSIS — E785 Hyperlipidemia, unspecified: Secondary | ICD-10-CM | POA: Diagnosis not present

## 2022-03-08 DIAGNOSIS — Z5181 Encounter for therapeutic drug level monitoring: Secondary | ICD-10-CM

## 2022-03-08 DIAGNOSIS — Z960 Presence of urogenital implants: Secondary | ICD-10-CM

## 2022-03-08 MED ORDER — ASCORBIC ACID 1000 MG PO TABS: 1000.0000 mg | ORAL_TABLET | Freq: Four times a day (QID) | ORAL | 5 refills | Status: DC

## 2022-03-08 MED ORDER — TIZANIDINE HCL 2 MG PO TABS: 2.0000 mg | ORAL_TABLET | Freq: Every day | ORAL | 1 refills | Status: DC

## 2022-03-08 MED ORDER — METHENAMINE HIPPURATE 1 G PO TABS: 1.0000 g | ORAL_TABLET | Freq: Two times a day (BID) | ORAL | 5 refills | Status: DC

## 2022-03-08 NOTE — Progress Notes (Signed)
Patient Active Problem List   Diagnosis Date Noted   Medication monitoring encounter 01/08/2022   Neurogenic bladder 10/11/2021   Urinary incontinence 10/11/2021   ESBL (extended spectrum beta-lactamase) producing bacteria infection 10/09/2021   Recurrent UTI 10/09/2021   Quadriplegia, C5-C7 incomplete ([place]) 01/16/2021   History of spinal fracture 01/16/2021   Suprapubic catheter ([place]) 01/16/2021   Encounter for routine gynecological examination 09/28/2013   Onychomycosis 09/28/2013   Foot deformity, acquired 03/26/2012   Encounter for preventive health examination 12/25/2010   ROSACEA 08/25/2009   Disturbance in sleep behavior 03/11/2008   SKIN CANCER, HX OF 03/11/2008   DYSURIA, HX OF 03/11/2008   Hyperlipidemia 02/10/2007   CERVICALGIA 02/10/2007    Patient's Medications  New Prescriptions   No medications on file  Previous Medications   ATORVASTATIN (LIPITOR) 20 MG TABLET    Take 1 tablet (20 mg total) by mouth daily.   BACLOFEN (LIORESAL) 20 MG TABLET    Take by mouth.   CHOLECALCIFEROL (VITAMIN D3) 25 MCG (1000 UNIT) TABLET    Take 1,000 Units by mouth daily. 2000u   CRANBERRY 200 MG CAPS    Take by mouth.   [employer]S COENZYME Q-10 100 MG CAPSULE    Take 100 mg by mouth daily.    DOCUSATE SODIUM (DSS) 100 MG CAPS    Take by mouth.   FESOTERODINE (TOVIAZ) 8 MG TB24 TABLET    Take 8 mg by mouth daily.   GABAPENTIN (NEURONTIN) 600 MG TABLET    Take 2 tablets (1,200 mg total) by mouth 3 (three) times daily.   METRONIDAZOLE (METROGEL) 1 % GEL    APPLY TOPICALLY EVERY DAY   MIRABEGRON ER (MYRBETRIQ) 25 MG TB24 TABLET    Take 50 mg by mouth.   MULTIPLE VITAMINS-MINERALS (CENTRUM SILVER ULTRA WOMENS PO)    Take by mouth.   MUPIROCIN OINTMENT (BACTROBAN) 2 %    Apply 1 Application topically 2 (two) times daily.   NORTRIPTYLINE (PAMELOR) 25 MG CAPSULE    Take 2 capsules (50 mg total) by mouth daily.   PREDNISONE (DELTASONE) 20 MG TABLET    Take 20 mg by mouth 2 (two) times  daily.   SENNA CO    by Combination route.   UREA (CARMOL) 40 % CREA    Apply daily to callused areas of feet   ZOLPIDEM (AMBIEN) 10 MG TABLET    TAKE 1/2-1 TABLET BY MOUTH AT BEDTIME AS NEEDED FOR SLEEP  Modified Medications   Modified Medication Previous Medication   ASCORBIC ACID (VITAMIN C) 1000 MG TABLET ascorbic acid (VITAMIN C) 1000 MG tablet      Take 1 tablet (1,000 mg total) by mouth in the morning, at noon, in the evening, and at bedtime.    Take 1 tablet (1,000 mg total) by mouth in the morning, at noon, in the evening, and at bedtime.   METHENAMINE (HIPREX) 1 G TABLET methenamine (HIPREX) 1 g tablet      Take 1 tablet (1 g total) by mouth 2 (two) times daily with a meal.    Take 1 tablet (1 g total) by mouth 2 (two) times daily with a meal.  Discontinued Medications   BACLOFEN (LIORESAL) 20 MG TABLET    TAKE 1&1/2 TABLETS BY MOUTH EVERY MORNING, 1 TABLET IN THE EVENING AND 1&1/2TABS AT BEDTIME   LEVOFLOXACIN (LEVAQUIN) 500 MG TABLET    Take 500 mg by mouth daily.   TIZANIDINE (ZANAFLEX) 2 MG CAPSULE  Take 2 mg by mouth daily.    Subjective: 71 YO Female with h/o Incomplete quadriplegia with neurogenic bladder s/p SPC with urinary incontinence who is here for fu in the setting of recurrent UTI. 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 She has been taking it regularly without missing doses with no concerns. She has a PCP appt later in the day and would like to get a CMP there. She has an aid with her today and helps with SPCc catheter change at home as needed. She follows up with Urology as needed. She is very pleased for not having recurrent UTIS and also to stay away from abtx. She needs refills. No other complaints.   Review of Systems: all systems reviewed with pertinent positives and negatives as listed above.    Past Medical History:  Diagnosis Date   CERVICAL POLYP 03/11/2008   Qualifier: Diagnosis of  By: Regis Bill MD,  Standley Brooking    Colon polyps 2005   on colonscopy Dr. Fuller Plan   Fibroid 2004   Per Dr. Ouida Sills   History of shingles    face and mouth   Hx of skin cancer, basal cell    Rosacea    Sciatica of left side 09/28/2013   Scoliosis    noted on mri done for back pain   Past Surgical History:  Procedure Laterality Date   BUNIONECTOMY      Social History   Tobacco Use   Smoking status: Never   Smokeless tobacco: Never  Vaping Use   Vaping Use: Never used  Substance Use Topics   Alcohol use: Not Currently    Alcohol/week: 7.0 standard drinks of alcohol    Types: 7 Glasses of wine per week    Family History  Problem Relation Age of Onset   Hypertension Father    Osteoporosis Other    Breast cancer Neg Hx     No Known Allergies  Health Maintenance  Topic Date Due   TETANUS/TDAP  06/28/2016   COVID-19 Vaccine (3 - Pfizer risk series) 03/20/2021   INFLUENZA VACCINE  01/12/2022   MAMMOGRAM  10/28/2023   Fecal DNA (Cologuard)  10/11/2024   Pneumonia Vaccine 12+ Years old  Completed   DEXA SCAN  Completed   Hepatitis C Screening  Completed   Zoster Vaccines- Shingrix  Completed   HPV VACCINES  Aged Out   COLONOSCOPY (Pts 45-42yr Insurance coverage will need to be confirmed)  Discontinued    Objective:  Vitals:   03/08/22 1129  BP: 132/80  Pulse: 93  Resp: 16  SpO2: 99%  Weight: 135 lb (61.2 kg)  Height: '5\' 4"'$  (1.626 m)   Body mass index is 23.17 kg/m.  Physical Exam Constitutional:      Appearance: Normal appearance.  HENT:     Head: Normocephalic and atraumatic.      Mouth: Mucous membranes are moist.  Eyes:    Conjunctiva/sclera: Conjunctivae normal.     Pupils:  Cardiovascular:     Rate and Rhythm: Normal rate and regular rhythm.     Heart sounds:  Pulmonary:     Effort: Pulmonary effort is normal on RA    Breath sounds:   Abdominal:     General: Non distended     Palpations: soft. SPC with a urobag - OK with no concerns   Musculoskeletal:         General: sitting in a wheel chair   Skin:  General: Skin is warm and dry.     Comments:  Neurological:     General: quadriplegia     Mental Status: awake, alert and oriented to person, place, and time.   Psychiatric:        Mood and Affect: Mood normal.   Lab Results Lab Results  Component Value Date   WBC 4.4 12/10/2019   HGB 13.4 12/10/2019   HCT 39.2 12/10/2019   MCV 95.1 12/10/2019   PLT 220.0 12/10/2019    Lab Results  Component Value Date   CREATININE 0.71 12/10/2019   BUN 23 12/10/2019   NA 137 12/10/2019   K 3.7 12/10/2019   CL 100 12/10/2019   CO2 29 12/10/2019    Lab Results  Component Value Date   ALT 20 12/10/2019   AST 25 12/10/2019   ALKPHOS 55 12/10/2019   BILITOT 0.9 12/10/2019    Lab Results  Component Value Date   CHOL 152 05/02/2020   HDL 70.70 05/02/2020   LDLCALC 65 05/02/2020   LDLDIRECT 162.3 03/21/2012   TRIG 82.0 05/02/2020   CHOLHDL 2 05/02/2020   No results found for: "LABRPR", "RPRTITER" No results found for: "HIV1RNAQUANT", "HIV1RNAVL", "CD4TABS"   Assessment/Plan # Recurrent UTI - continue methenamine and vitamin C as is. No UTIs since 4/28 when she started taking methenamine and vit C - Medications refilled - CMP - Fu in 5 months    # Incomplete Quadriplegia  # Neurogenic Bladder s/p SPC # Urinary Incontinence On Fesoterodine and [facility]    I have personally spent 45 minutes involved in face-to-face and non-face-to-face activities for this patient on the day of the visit. Professional time spent includes the following activities: Preparing to see the patient (review of tests), Obtaining and/or reviewing separately obtained history (admission/discharge record), Performing a medically appropriate examination and/or evaluation , Ordering medications/tests/procedures, referring and communicating with other health care professionals, Documenting clinical information in the EMR, Independently interpreting  results (not separately reported), Communicating results to the patient/family/caregiver, Counseling and educating the patient/family/caregiver and Care coordination (not separately reported).   Wilber Oliphant, [facility] 03/08/2022, 11:45 AM

## 2022-03-08 NOTE — Patient Instructions (Addendum)
Good to see you today . Will refill the tizanidine  to [facility].   Add fiber to diet  for bowel help.  Try wa softener for 1+ weeks at night   ( no q tips to see if softens wax  and either resolve or  gentle flushing ) There is  evidemce of  ear canal abrasion prob from q tips. )  Call ahead for bone  density order before your mammogram at the [facility].

## 2022-03-09 LAB — CBC WITH DIFFERENTIAL/PLATELET
Basophils Absolute: 0.1 10*3/uL (ref 0.0–0.1)
Basophils Relative: 1 % (ref 0.0–3.0)
Eosinophils Absolute: 0.1 10*3/uL (ref 0.0–0.7)
Eosinophils Relative: 2.5 % (ref 0.0–5.0)
HCT: 38.6 % (ref 36.0–46.0)
Hemoglobin: 12.9 g/dL (ref 12.0–15.0)
Lymphocytes Relative: 24.1 % (ref 12.0–46.0)
Lymphs Abs: 1.2 10*3/uL (ref 0.7–4.0)
MCHC: 33.3 g/dL (ref 30.0–36.0)
MCV: 92.2 fl (ref 78.0–100.0)
Monocytes Absolute: 0.4 10*3/uL (ref 0.1–1.0)
Monocytes Relative: 8.6 % (ref 3.0–12.0)
Neutro Abs: 3.1 10*3/uL (ref 1.4–7.7)
Neutrophils Relative %: 63.8 % (ref 43.0–77.0)
Platelets: 259 10*3/uL (ref 150.0–400.0)
RBC: 4.19 Mil/uL (ref 3.87–5.11)
RDW: 16.7 % — ABNORMAL HIGH (ref 11.5–15.5)
WBC: 4.9 10*3/uL (ref 4.0–10.5)

## 2022-03-09 LAB — COMPREHENSIVE METABOLIC PANEL
ALT: 18 U/L (ref 0–35)
AST: 28 U/L (ref 0–37)
Albumin: 3.9 g/dL (ref 3.5–5.2)
Alkaline Phosphatase: 65 U/L (ref 39–117)
BUN: 9 mg/dL (ref 6–23)
CO2: 27 mEq/L (ref 19–32)
Calcium: 9.6 mg/dL (ref 8.4–10.5)
Chloride: 100 mEq/L (ref 96–112)
Creatinine, Ser: 0.38 mg/dL — ABNORMAL LOW (ref 0.40–1.20)
GFR: 101.33 mL/min (ref >60.00)
Glucose, Bld: 76 mg/dL (ref 70–99)
Potassium: 4.6 mEq/L (ref 3.5–5.1)
Sodium: 137 mEq/L (ref 135–145)
Total Bilirubin: 0.4 mg/dL (ref 0.2–1.2)
Total Protein: 7.3 g/dL (ref 6.0–8.3)

## 2022-03-09 LAB — LIPID PANEL
Cholesterol: 140 mg/dL (ref 0–200)
HDL: 59.5 mg/dL (ref >39.00)
LDL Cholesterol: 69 mg/dL (ref 0–99)
NonHDL: 80.61
Total CHOL/HDL Ratio: 2
Triglycerides: 56 mg/dL (ref 0.0–149.0)
VLDL: 11.2 mg/dL (ref 0.0–40.0)

## 2022-03-09 LAB — TSH: TSH: 2.93 u[IU]/mL (ref 0.35–5.50)

## 2022-03-09 LAB — VITAMIN D 25 HYDROXY (VIT D DEFICIENCY, FRACTURES): VITD: 58.54 ng/mL (ref 30.00–100.00)

## 2022-03-09 LAB — MAGNESIUM: Magnesium: 2 mg/dL (ref 1.5–2.5)

## 2022-03-09 LAB — T4, FREE: Free T4: 1.22 ng/dL (ref 0.60–1.60)

## 2022-03-17 ENCOUNTER — Other Ambulatory Visit: Payer: Self-pay | Admitting: Internal Medicine

## 2022-03-19 DIAGNOSIS — G8254 Quadriplegia, C5-C7 incomplete: Secondary | ICD-10-CM | POA: Diagnosis not present

## 2022-03-19 DIAGNOSIS — G8250 Quadriplegia, unspecified: Secondary | ICD-10-CM | POA: Diagnosis not present

## 2022-03-23 ENCOUNTER — Other Ambulatory Visit: Payer: Self-pay | Admitting: Internal Medicine

## 2022-03-23 ENCOUNTER — Encounter: Payer: Self-pay | Admitting: Internal Medicine

## 2022-03-24 DIAGNOSIS — G8250 Quadriplegia, unspecified: Secondary | ICD-10-CM | POA: Diagnosis not present

## 2022-03-24 DIAGNOSIS — G8254 Quadriplegia, C5-C7 incomplete: Secondary | ICD-10-CM | POA: Diagnosis not present

## 2022-03-25 DIAGNOSIS — G8250 Quadriplegia, unspecified: Secondary | ICD-10-CM | POA: Diagnosis not present

## 2022-03-25 DIAGNOSIS — G8254 Quadriplegia, C5-C7 incomplete: Secondary | ICD-10-CM | POA: Diagnosis not present

## 2022-03-27 NOTE — Progress Notes (Signed)
Lipids  stable  LDL at goal  Out of range levels not clinically significant

## 2022-03-27 NOTE — Telephone Encounter (Signed)
RDW is  not concerning . Red cell distribution width is a measure of distribution of red cell size.  Often increases if iron deficient or if bone marrow  an bone marrow if appropriately  making new cells  .  So results can be  non specific.  It is minimally  elevated and you are not anemic  and the rest of  the blood count is normal  ; this is all reassuring  , so would take no further action at this time.

## 2022-03-31 DIAGNOSIS — G8254 Quadriplegia, C5-C7 incomplete: Secondary | ICD-10-CM | POA: Diagnosis not present

## 2022-03-31 DIAGNOSIS — G8250 Quadriplegia, unspecified: Secondary | ICD-10-CM | POA: Diagnosis not present

## 2022-04-01 DIAGNOSIS — G8254 Quadriplegia, C5-C7 incomplete: Secondary | ICD-10-CM | POA: Diagnosis not present

## 2022-04-01 DIAGNOSIS — G8250 Quadriplegia, unspecified: Secondary | ICD-10-CM | POA: Diagnosis not present

## 2022-04-07 ENCOUNTER — Encounter: Payer: Self-pay | Admitting: Internal Medicine

## 2022-04-07 DIAGNOSIS — M461 Sacroiliitis, not elsewhere classified: Secondary | ICD-10-CM | POA: Diagnosis not present

## 2022-04-07 DIAGNOSIS — G8254 Quadriplegia, C5-C7 incomplete: Secondary | ICD-10-CM | POA: Diagnosis not present

## 2022-04-07 DIAGNOSIS — G8250 Quadriplegia, unspecified: Secondary | ICD-10-CM | POA: Diagnosis not present

## 2022-04-08 ENCOUNTER — Other Ambulatory Visit: Payer: Self-pay

## 2022-04-09 DIAGNOSIS — G8254 Quadriplegia, C5-C7 incomplete: Secondary | ICD-10-CM | POA: Diagnosis not present

## 2022-04-09 DIAGNOSIS — G8250 Quadriplegia, unspecified: Secondary | ICD-10-CM | POA: Diagnosis not present

## 2022-04-09 MED ORDER — ZOLPIDEM TARTRATE 10 MG PO TABS: ORAL_TABLET | ORAL | 2 refills | Status: DC

## 2022-04-09 NOTE — Telephone Encounter (Signed)
Sent electronically 

## 2022-04-12 DIAGNOSIS — G8254 Quadriplegia, C5-C7 incomplete: Secondary | ICD-10-CM | POA: Diagnosis not present

## 2022-04-12 DIAGNOSIS — G8250 Quadriplegia, unspecified: Secondary | ICD-10-CM | POA: Diagnosis not present

## 2022-04-15 DIAGNOSIS — G8254 Quadriplegia, C5-C7 incomplete: Secondary | ICD-10-CM | POA: Diagnosis not present

## 2022-04-15 DIAGNOSIS — G8250 Quadriplegia, unspecified: Secondary | ICD-10-CM | POA: Diagnosis not present

## 2022-04-17 ENCOUNTER — Encounter: Payer: Self-pay | Admitting: Internal Medicine

## 2022-04-19 DIAGNOSIS — G8254 Quadriplegia, C5-C7 incomplete: Secondary | ICD-10-CM | POA: Diagnosis not present

## 2022-04-19 DIAGNOSIS — G8250 Quadriplegia, unspecified: Secondary | ICD-10-CM | POA: Diagnosis not present

## 2022-04-22 DIAGNOSIS — G8254 Quadriplegia, C5-C7 incomplete: Secondary | ICD-10-CM | POA: Diagnosis not present

## 2022-04-22 DIAGNOSIS — G8250 Quadriplegia, unspecified: Secondary | ICD-10-CM | POA: Diagnosis not present

## 2022-04-27 DIAGNOSIS — G8250 Quadriplegia, unspecified: Secondary | ICD-10-CM | POA: Diagnosis not present

## 2022-04-27 DIAGNOSIS — G8254 Quadriplegia, C5-C7 incomplete: Secondary | ICD-10-CM | POA: Diagnosis not present

## 2022-04-29 DIAGNOSIS — M461 Sacroiliitis, not elsewhere classified: Secondary | ICD-10-CM | POA: Diagnosis not present

## 2022-04-30 DIAGNOSIS — G8254 Quadriplegia, C5-C7 incomplete: Secondary | ICD-10-CM | POA: Diagnosis not present

## 2022-04-30 DIAGNOSIS — G8250 Quadriplegia, unspecified: Secondary | ICD-10-CM | POA: Diagnosis not present

## 2022-05-03 ENCOUNTER — Encounter: Payer: Medicare PPO | Attending: Physical Medicine and Rehabilitation | Admitting: Physical Medicine and Rehabilitation

## 2022-05-03 ENCOUNTER — Encounter: Payer: Self-pay | Admitting: Physical Medicine and Rehabilitation

## 2022-05-03 VITALS — BP 118/78 | HR 97 | Ht 64.0 in

## 2022-05-03 DIAGNOSIS — R252 Cramp and spasm: Secondary | ICD-10-CM | POA: Insufficient documentation

## 2022-05-03 DIAGNOSIS — K592 Neurogenic bowel, not elsewhere classified: Secondary | ICD-10-CM | POA: Insufficient documentation

## 2022-05-03 DIAGNOSIS — Z993 Dependence on wheelchair: Secondary | ICD-10-CM | POA: Insufficient documentation

## 2022-05-03 DIAGNOSIS — M792 Neuralgia and neuritis, unspecified: Secondary | ICD-10-CM | POA: Insufficient documentation

## 2022-05-03 DIAGNOSIS — Z9359 Other cystostomy status: Secondary | ICD-10-CM | POA: Diagnosis not present

## 2022-05-03 DIAGNOSIS — N319 Neuromuscular dysfunction of bladder, unspecified: Secondary | ICD-10-CM | POA: Insufficient documentation

## 2022-05-03 DIAGNOSIS — G8254 Quadriplegia, C5-C7 incomplete: Secondary | ICD-10-CM | POA: Insufficient documentation

## 2022-05-03 MED ORDER — BACLOFEN 20 MG PO TABS: 40.0000 mg | ORAL_TABLET | Freq: Three times a day (TID) | ORAL | 5 refills | Status: DC

## 2022-05-03 MED ORDER — MIDODRINE HCL 5 MG PO TABS: 5.0000 mg | ORAL_TABLET | Freq: Two times a day (BID) | ORAL | 5 refills | Status: DC

## 2022-05-03 NOTE — Patient Instructions (Addendum)
Pt is a 71 yr old L handed female with hx of incomplete quadriplegia- 2/14 0349- fleeing the police in [place] on passenger 100 (high speed) miles/hour,  [name]; neurogenic bowel and bladder and spasticity; [name], [name] Here for evaluation. C7 ASIA C-  Was in research study for molecule allow regeneration of SCI.   Discuss CBD oil- start less than 10 mg - gummies 1-2x/day- takes 30-60 minutes to kick in- oils/tincture under tongue  2-4x/day- much faster- all over pain.   2. Female Purewick? For bladder at night?  3. Dr Barnabas Lister Jenny Reichmann) [place]- at [facility]- goal to get rid of [place]-    4. [facility] for pelvic floor therapy AFTER Suprapubic catheter is out.   5. Little mermaid posture- on abdomen- 10-15 minutes in Am and before bed.   6. Increase baclofen 30 mg in Am, 20 mg in afternoon vs evening; and 40 mg at bedtime. Will write for 40 mg 3x/day if has infection,c an take up to 40 mg 3x/day -   7. Stop Tizanidine.   8. Reduce Colace if stools loose.   9. At level SCI pain- one thing that might help is lidocaine patches- try over the counter ones first- no more than 3 patches at a time.   10. Foam dressing bandaids that could put over sores on feet  11. BP tends a little low- having orthostatic hypotension- already wearing compression stockings- abd binder or can try midodrine-   12. F/U in 3 months- double appt  13  Needs handicapped placard/permit.   14.  If Spasticity spikes, needs to look for inflammation/infection of body

## 2022-05-03 NOTE — Progress Notes (Signed)
Subjective:    Patient ID: [name], female    DOB: April 20, 1951, 71 y.o.   MRN: [id number]  HPI Pt is a 71 yr old L handed female with hx of incomplete quadriplegia- 2/14 9937- fleeing the police in [place] on passenger 100 (high speed) miles/hour,  [name]; neurogenic bowel and bladder and spasticity; [name], [name] Here for evaluation.  Went to [facility] for [employer]- did day rehab as well.  And has done additional 4 weeks of day program since then.  Plans on going back next spring.   Bladder- a Urologist- MacDiarmid- had Urodynamics- systematically expanded bladder- has sensation, knows where to go-  Has SPC- clamps it.  Gets over on shower chair to void- only does at time doing bowel program.   Bowels- does suppositories-  Can push, cannot hold- doesn't have actual control.    Spasticity- in the morning has some spasms- with stretching to "get them over".   Cramps or spasms at night mainly in R leg- bicycle leg to help.    W/C- primary w/c- from [employer]-  Both are power chairs-  Both can raise up to eye height.  Using back up power w/c- from [place]- got used.   Uses Ulice Dash 2 cushion for both power w/c's and manual w/c.  Uses manual w/c when traveling.    No pressure ulcers; but has some area on L buttock- had telehealth appointment- skin intact and fading- Does pressure relief ~ q 1hour- - has sensation.   Got an injection in R SI joint- steroid injection- last week for severe SI pain.   Had pilonidal cyst- has closed and resolved. More sacrum than coccyx.    Has nerve pain- On gabapentin 600 mg tabs - 3600/mg/day.  Has been on Cymbalta in inpt- didn't tolerate it well. Thinks grogginess.  Also tried again, didn't tolerate it well again.   Asking about ketamine-   Sores on sides little toe- and side of foot- irritated by shoes? Wears extra wide men's shoes- shoes? Pain Inventory Average Pain 4 Pain Right Now 3 My pain is  constant and strong electrical tingle  LOCATION OF PAIN  elbow to finger tips on both sides  BOWEL Number of stools per week: 2 Oral laxative use Yes  Type of laxative Doculax Enema or suppository use Yes  History of colostomy No  Incontinent No   BLADDER Suprapubic    Mobility use a wheelchair needs help with transfers Do you have any goals in this area?  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  yes  Neuro/Psych weakness numbness tremor tingling trouble walking spasms  Prior Studies Any changes since last visit?  yes Spinal injection by Dr. Andree Elk   Physicians involved in your care Any changes since last visit?  no   Family History  Problem Relation Age of Onset   Hypertension Father    Osteoporosis Other    Breast cancer Neg Hx    Social History   Socioeconomic History   Marital status: Married    Spouse name: Not on file   Number of children: Not on file   Years of education: Not on file   Highest education level: Doctorate  Occupational History   Not on file  Tobacco Use   Smoking status: Never  Smokeless tobacco: Never  Vaping Use   Vaping Use: Never used  Substance and Sexual Activity   Alcohol use: Not Currently    Alcohol/week: 7.0 standard drinks of alcohol    Types: 7 Glasses of wine per week   Drug use: Yes    Comment: wine at night   Sexual activity: Not on file  Other Topics Concern   Not on file  Social History Narrative   Married   Spouse had CABG    [employer]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   Has moved to [place]   hh of 2    2 cats      Social Determinants of Health   Financial Resource Strain: [place]  (03/04/2022)   Overall Financial Resource Strain (CARDIA)    Difficulty of Paying Living Expenses: Not hard at all  Food Insecurity: No Food Insecurity  (03/04/2022)   Hunger Vital Sign    Worried About Running Out of Food in the Last Year: Never true    [place] in the Last Year: Never true  Transportation Needs: No Transportation Needs (03/04/2022)   PRAPARE - Hydrologist (Medical): No    Lack of Transportation (Non-Medical): No  Physical Activity: Unknown (03/04/2022)   Exercise Vital Sign    Days of Exercise per Week: Patient refused    Minutes of Exercise per Session: 0 min  Recent Concern: Physical Activity - Inactive (03/02/2022)   Exercise Vital Sign    Days of Exercise per Week: 0 days    Minutes of Exercise per Session: 0 min  Stress: No Stress Concern Present (03/04/2022)   [place]    Feeling of Stress : Only a little  Social Connections: Moderately Integrated (03/04/2022)   Social Connection and Isolation Panel [NHANES]    Frequency of Communication with Friends and Family: Twice a week    Frequency of Social Gatherings with Friends and Family: Once a week    Attends Religious Services: Never    Marine scientist or Organizations: Yes    Attends Archivist Meetings: 1 to 4 times per year    Marital Status: Married   Past Surgical History:  Procedure Laterality Date   BUNIONECTOMY     Past Medical History:  Diagnosis Date   CERVICAL POLYP 03/11/2008   Qualifier: Diagnosis of  By: Regis Bill MD, Standley Brooking    Colon polyps 2005   on colonscopy Dr. Fuller Plan   Fibroid 2004   Per Dr. Ouida Sills   History of shingles    face and mouth   Hx of skin cancer, basal cell    Rosacea    Sciatica of left side 09/28/2013   Scoliosis    noted on mri done for back pain   Ht '5\' 4"'$  (1.626 m)   BMI 23.17 kg/m   Opioid Risk Score:   Fall Risk Score:  `1  Depression screen PHQ 2/9     03/02/2022   12:05 PM 10/09/2021    1:51 PM 02/24/2021    2:39 PM 12/10/2019    2:20 PM 04/14/2017    8:20 AM 09/28/2013   11:40 AM   Depression screen PHQ 2/9  Decreased Interest 0 0 0 0 0 0  Down, Depressed, Hopeless 0 0 0 0 0 0  PHQ - 2 Score 0 0 0 0 0 0  Altered sleeping    0  Tired, decreased energy    0    Change in appetite    0    Feeling bad or failure about yourself     0    Trouble concentrating    0    Moving slowly or fidgety/restless    0    Suicidal thoughts    0    PHQ-9 Score    0    Difficult doing work/chores    Not difficult at all      Review of Systems  Musculoskeletal:  Positive for gait problem.       SPASMS  Neurological:  Positive for tremors, weakness and numbness.  All other systems reviewed and are negative.      Objective:   Physical Exam Awake, alert, appropriate, in power w/c; accompanied by husband and nurse, NAD MS: RUE biceps 5/5; WE 4+/5; triceps 4+/5, Grip 2/5 without tenodesis; FA 2-/5 LUE- biceps 5/5; WE 4+/5; Triceps 4-/5- a little weaker than RUE; grip 4-/5; and FA 2/5 Les:  RLE-HF 1/5; KE 2-/5; DF 0/5; PF 2-/5 LLE- HF 2-/5; KE 3-/5; DF 3-/5; PF 3/5  Neuro: Decreased to light touch in L1-L3 B/L- otherwise intact to light touch in all 4 extremities and torso Increased DTR's in patellae and achilles- but normal in biceps B/L Hoffman's B/L A few beats clonus B/L Some hand spasms noted as well, but can fully straighten fingers     Assessment & Plan:   Pt is a 71 yr old L handed female with hx of incomplete quadriplegia- 2/14 6269- fleeing the police in [place] on passenger 100 (high speed) miles/hour,  [name]; neurogenic bowel and bladder and spasticity; [name], [name] Here for evaluation. C7 ASIA C-  Was in research study for molecule allow regeneration of SCI.   Discuss CBD oil- start less than 10 mg - gummies 1-2x/day- takes 30-60 minutes to kick in- oils/tincture under tongue  2-4x/day- much faster- all over pain.   2. Female Purewick? For bladder at night?  3. Dr Barnabas Lister Jenny Reichmann) [place]- at [facility]- goal to get rid of [place]-     4. [facility] for pelvic floor therapy AFTER Suprapubic catheter is out.   5. Little mermaid posture- on abdomen- 10-15 minutes in Am and before bed.   6. Increase baclofen 30 mg in Am, 20 mg in afternoon vs evening; and 40 mg at bedtime. Will write for 40 mg 3x/day if has infection,c an take up to 40 mg 3x/day -   7. Stop Tizanidine.   8. Reduce Colace if stools loose.   9. At level SCI pain- one thing that might help is lidocaine patches- try over the counter ones first- no more than 3 patches at a time.   10. Foam dressing bandaids that could put over sores on feet  11. BP tends a little low- having orthostatic hypotension- already wearing compression stockings- abd binder or can try midodrine-   12. F/U in 3 months- double appt  13  Needs handicapped placard/permit.    I spent a total of  62  minutes on total care today- >50% coordination of care- due to issues as detailed above- also educated n At level SCI pain and spasticity. If Spasticity spikes, needs to look for inflammation/infection of body

## 2022-05-12 DIAGNOSIS — G8254 Quadriplegia, C5-C7 incomplete: Secondary | ICD-10-CM | POA: Diagnosis not present

## 2022-05-12 DIAGNOSIS — G8250 Quadriplegia, unspecified: Secondary | ICD-10-CM | POA: Diagnosis not present

## 2022-05-14 ENCOUNTER — Telehealth: Payer: Self-pay | Admitting: Internal Medicine

## 2022-05-14 DIAGNOSIS — G8250 Quadriplegia, unspecified: Secondary | ICD-10-CM | POA: Diagnosis not present

## 2022-05-14 DIAGNOSIS — G8254 Quadriplegia, C5-C7 incomplete: Secondary | ICD-10-CM | POA: Diagnosis not present

## 2022-05-14 NOTE — Telephone Encounter (Signed)
LVM message informing Baker Janus that PCP is out of office until Dec 19. Offered that she may speak to different provider, CMA, or wait until PCP returns.

## 2022-05-14 NOTE — Telephone Encounter (Addendum)
[employer] would like to offer MD a Peer to Peer to discuss Pt's wheelchair repairs. Caller stated it was ok if MD's assistant called. Please call to confirm this PTP phone call.  [phone] Baker Janus (RN / Mcarthur Rossetti)

## 2022-05-20 DIAGNOSIS — G8254 Quadriplegia, C5-C7 incomplete: Secondary | ICD-10-CM | POA: Diagnosis not present

## 2022-05-20 DIAGNOSIS — G8250 Quadriplegia, unspecified: Secondary | ICD-10-CM | POA: Diagnosis not present

## 2022-05-20 DIAGNOSIS — M461 Sacroiliitis, not elsewhere classified: Secondary | ICD-10-CM | POA: Diagnosis not present

## 2022-05-25 DIAGNOSIS — G8250 Quadriplegia, unspecified: Secondary | ICD-10-CM | POA: Diagnosis not present

## 2022-05-25 DIAGNOSIS — G8254 Quadriplegia, C5-C7 incomplete: Secondary | ICD-10-CM | POA: Diagnosis not present

## 2022-05-26 ENCOUNTER — Encounter: Payer: Self-pay | Admitting: Physical Medicine and Rehabilitation

## 2022-05-27 DIAGNOSIS — G8254 Quadriplegia, C5-C7 incomplete: Secondary | ICD-10-CM | POA: Diagnosis not present

## 2022-05-27 DIAGNOSIS — G8250 Quadriplegia, unspecified: Secondary | ICD-10-CM | POA: Diagnosis not present

## 2022-05-31 DIAGNOSIS — G8254 Quadriplegia, C5-C7 incomplete: Secondary | ICD-10-CM | POA: Diagnosis not present

## 2022-05-31 DIAGNOSIS — G8250 Quadriplegia, unspecified: Secondary | ICD-10-CM | POA: Diagnosis not present

## 2022-06-02 DIAGNOSIS — G8250 Quadriplegia, unspecified: Secondary | ICD-10-CM | POA: Diagnosis not present

## 2022-06-02 DIAGNOSIS — G8254 Quadriplegia, C5-C7 incomplete: Secondary | ICD-10-CM | POA: Diagnosis not present

## 2022-06-08 DIAGNOSIS — G8250 Quadriplegia, unspecified: Secondary | ICD-10-CM | POA: Diagnosis not present

## 2022-06-08 DIAGNOSIS — G8254 Quadriplegia, C5-C7 incomplete: Secondary | ICD-10-CM | POA: Diagnosis not present

## 2022-06-10 ENCOUNTER — Other Ambulatory Visit: Payer: Self-pay | Admitting: Internal Medicine

## 2022-06-10 DIAGNOSIS — G8250 Quadriplegia, unspecified: Secondary | ICD-10-CM | POA: Diagnosis not present

## 2022-06-10 DIAGNOSIS — G8254 Quadriplegia, C5-C7 incomplete: Secondary | ICD-10-CM | POA: Diagnosis not present

## 2022-06-12 ENCOUNTER — Encounter: Payer: Self-pay | Admitting: Internal Medicine

## 2022-06-15 ENCOUNTER — Other Ambulatory Visit: Payer: Self-pay

## 2022-06-15 DIAGNOSIS — G8250 Quadriplegia, unspecified: Secondary | ICD-10-CM | POA: Diagnosis not present

## 2022-06-15 DIAGNOSIS — G8254 Quadriplegia, C5-C7 incomplete: Secondary | ICD-10-CM | POA: Diagnosis not present

## 2022-06-15 MED ORDER — GABAPENTIN 600 MG PO TABS: 1200.0000 mg | ORAL_TABLET | Freq: Three times a day (TID) | ORAL | 1 refills | Status: DC

## 2022-06-18 DIAGNOSIS — G8254 Quadriplegia, C5-C7 incomplete: Secondary | ICD-10-CM | POA: Diagnosis not present

## 2022-06-18 DIAGNOSIS — G8250 Quadriplegia, unspecified: Secondary | ICD-10-CM | POA: Diagnosis not present

## 2022-06-18 DIAGNOSIS — R338 Other retention of urine: Secondary | ICD-10-CM | POA: Diagnosis not present

## 2022-06-18 DIAGNOSIS — N319 Neuromuscular dysfunction of bladder, unspecified: Secondary | ICD-10-CM | POA: Diagnosis not present

## 2022-06-21 DIAGNOSIS — G8254 Quadriplegia, C5-C7 incomplete: Secondary | ICD-10-CM | POA: Diagnosis not present

## 2022-06-21 DIAGNOSIS — G8250 Quadriplegia, unspecified: Secondary | ICD-10-CM | POA: Diagnosis not present

## 2022-06-23 DIAGNOSIS — G8250 Quadriplegia, unspecified: Secondary | ICD-10-CM | POA: Diagnosis not present

## 2022-06-23 DIAGNOSIS — G8254 Quadriplegia, C5-C7 incomplete: Secondary | ICD-10-CM | POA: Diagnosis not present

## 2022-06-24 ENCOUNTER — Other Ambulatory Visit: Payer: Self-pay | Admitting: Internal Medicine

## 2022-07-01 DIAGNOSIS — G8254 Quadriplegia, C5-C7 incomplete: Secondary | ICD-10-CM | POA: Diagnosis not present

## 2022-07-01 DIAGNOSIS — G8250 Quadriplegia, unspecified: Secondary | ICD-10-CM | POA: Diagnosis not present

## 2022-07-02 ENCOUNTER — Encounter: Payer: Self-pay | Admitting: Physical Medicine and Rehabilitation

## 2022-07-05 MED ORDER — BACLOFEN 20 MG PO TABS: 40.0000 mg | ORAL_TABLET | Freq: Four times a day (QID) | ORAL | 5 refills | Status: DC

## 2022-07-09 ENCOUNTER — Other Ambulatory Visit: Payer: Self-pay | Admitting: Internal Medicine

## 2022-07-09 DIAGNOSIS — G8250 Quadriplegia, unspecified: Secondary | ICD-10-CM | POA: Diagnosis not present

## 2022-07-09 DIAGNOSIS — G8254 Quadriplegia, C5-C7 incomplete: Secondary | ICD-10-CM | POA: Diagnosis not present

## 2022-07-12 DIAGNOSIS — G8250 Quadriplegia, unspecified: Secondary | ICD-10-CM | POA: Diagnosis not present

## 2022-07-12 DIAGNOSIS — G8254 Quadriplegia, C5-C7 incomplete: Secondary | ICD-10-CM | POA: Diagnosis not present

## 2022-07-13 DIAGNOSIS — I872 Venous insufficiency (chronic) (peripheral): Secondary | ICD-10-CM | POA: Diagnosis not present

## 2022-07-13 DIAGNOSIS — E7849 Other hyperlipidemia: Secondary | ICD-10-CM | POA: Diagnosis not present

## 2022-07-13 DIAGNOSIS — N319 Neuromuscular dysfunction of bladder, unspecified: Secondary | ICD-10-CM | POA: Diagnosis not present

## 2022-07-13 DIAGNOSIS — F5101 Primary insomnia: Secondary | ICD-10-CM | POA: Diagnosis not present

## 2022-07-13 DIAGNOSIS — N390 Urinary tract infection, site not specified: Secondary | ICD-10-CM | POA: Diagnosis not present

## 2022-07-13 DIAGNOSIS — G609 Hereditary and idiopathic neuropathy, unspecified: Secondary | ICD-10-CM | POA: Diagnosis not present

## 2022-07-13 DIAGNOSIS — S14109S Unspecified injury at unspecified level of cervical spinal cord, sequela: Secondary | ICD-10-CM | POA: Diagnosis not present

## 2022-07-13 DIAGNOSIS — L8991 Pressure ulcer of unspecified site, stage 1: Secondary | ICD-10-CM | POA: Diagnosis not present

## 2022-07-15 DIAGNOSIS — G8250 Quadriplegia, unspecified: Secondary | ICD-10-CM | POA: Diagnosis not present

## 2022-07-15 DIAGNOSIS — G8254 Quadriplegia, C5-C7 incomplete: Secondary | ICD-10-CM | POA: Diagnosis not present

## 2022-07-20 DIAGNOSIS — G8250 Quadriplegia, unspecified: Secondary | ICD-10-CM | POA: Diagnosis not present

## 2022-07-20 DIAGNOSIS — G8254 Quadriplegia, C5-C7 incomplete: Secondary | ICD-10-CM | POA: Diagnosis not present

## 2022-07-22 DIAGNOSIS — G629 Polyneuropathy, unspecified: Secondary | ICD-10-CM | POA: Diagnosis not present

## 2022-07-22 DIAGNOSIS — L89611 Pressure ulcer of right heel, stage 1: Secondary | ICD-10-CM | POA: Diagnosis not present

## 2022-07-22 DIAGNOSIS — L89320 Pressure ulcer of left buttock, unstageable: Secondary | ICD-10-CM | POA: Diagnosis not present

## 2022-07-22 DIAGNOSIS — G8250 Quadriplegia, unspecified: Secondary | ICD-10-CM | POA: Diagnosis not present

## 2022-07-22 DIAGNOSIS — L97511 Non-pressure chronic ulcer of other part of right foot limited to breakdown of skin: Secondary | ICD-10-CM | POA: Diagnosis not present

## 2022-07-22 DIAGNOSIS — I890 Lymphedema, not elsewhere classified: Secondary | ICD-10-CM | POA: Diagnosis not present

## 2022-07-22 DIAGNOSIS — G8254 Quadriplegia, C5-C7 incomplete: Secondary | ICD-10-CM | POA: Diagnosis not present

## 2022-07-22 DIAGNOSIS — L89150 Pressure ulcer of sacral region, unstageable: Secondary | ICD-10-CM | POA: Diagnosis not present

## 2022-07-22 DIAGNOSIS — L89621 Pressure ulcer of left heel, stage 1: Secondary | ICD-10-CM | POA: Diagnosis not present

## 2022-07-22 DIAGNOSIS — Z79899 Other long term (current) drug therapy: Secondary | ICD-10-CM | POA: Diagnosis not present

## 2022-07-22 DIAGNOSIS — L89891 Pressure ulcer of other site, stage 1: Secondary | ICD-10-CM | POA: Diagnosis not present

## 2022-07-25 ENCOUNTER — Encounter: Payer: Self-pay | Admitting: Physical Medicine and Rehabilitation

## 2022-07-26 DIAGNOSIS — G8254 Quadriplegia, C5-C7 incomplete: Secondary | ICD-10-CM | POA: Diagnosis not present

## 2022-07-26 DIAGNOSIS — G8250 Quadriplegia, unspecified: Secondary | ICD-10-CM | POA: Diagnosis not present

## 2022-07-28 DIAGNOSIS — G8254 Quadriplegia, C5-C7 incomplete: Secondary | ICD-10-CM | POA: Diagnosis not present

## 2022-07-28 DIAGNOSIS — G8250 Quadriplegia, unspecified: Secondary | ICD-10-CM | POA: Diagnosis not present

## 2022-07-29 DIAGNOSIS — L89621 Pressure ulcer of left heel, stage 1: Secondary | ICD-10-CM | POA: Diagnosis not present

## 2022-07-29 DIAGNOSIS — L97511 Non-pressure chronic ulcer of other part of right foot limited to breakdown of skin: Secondary | ICD-10-CM | POA: Diagnosis not present

## 2022-07-29 DIAGNOSIS — L89611 Pressure ulcer of right heel, stage 1: Secondary | ICD-10-CM | POA: Diagnosis not present

## 2022-07-29 DIAGNOSIS — I890 Lymphedema, not elsewhere classified: Secondary | ICD-10-CM | POA: Diagnosis not present

## 2022-07-29 DIAGNOSIS — L89320 Pressure ulcer of left buttock, unstageable: Secondary | ICD-10-CM | POA: Diagnosis not present

## 2022-07-29 DIAGNOSIS — G8250 Quadriplegia, unspecified: Secondary | ICD-10-CM | POA: Diagnosis not present

## 2022-07-29 DIAGNOSIS — G629 Polyneuropathy, unspecified: Secondary | ICD-10-CM | POA: Diagnosis not present

## 2022-07-29 DIAGNOSIS — Z79899 Other long term (current) drug therapy: Secondary | ICD-10-CM | POA: Diagnosis not present

## 2022-07-29 DIAGNOSIS — L89891 Pressure ulcer of other site, stage 1: Secondary | ICD-10-CM | POA: Diagnosis not present

## 2022-08-03 DIAGNOSIS — G8250 Quadriplegia, unspecified: Secondary | ICD-10-CM | POA: Diagnosis not present

## 2022-08-03 DIAGNOSIS — G8254 Quadriplegia, C5-C7 incomplete: Secondary | ICD-10-CM | POA: Diagnosis not present

## 2022-08-05 DIAGNOSIS — G8254 Quadriplegia, C5-C7 incomplete: Secondary | ICD-10-CM | POA: Diagnosis not present

## 2022-08-05 DIAGNOSIS — Z79899 Other long term (current) drug therapy: Secondary | ICD-10-CM | POA: Diagnosis not present

## 2022-08-05 DIAGNOSIS — L89621 Pressure ulcer of left heel, stage 1: Secondary | ICD-10-CM | POA: Diagnosis not present

## 2022-08-05 DIAGNOSIS — L89611 Pressure ulcer of right heel, stage 1: Secondary | ICD-10-CM | POA: Diagnosis not present

## 2022-08-05 DIAGNOSIS — G8250 Quadriplegia, unspecified: Secondary | ICD-10-CM | POA: Diagnosis not present

## 2022-08-05 DIAGNOSIS — L89320 Pressure ulcer of left buttock, unstageable: Secondary | ICD-10-CM | POA: Diagnosis not present

## 2022-08-05 DIAGNOSIS — I890 Lymphedema, not elsewhere classified: Secondary | ICD-10-CM | POA: Diagnosis not present

## 2022-08-05 DIAGNOSIS — G629 Polyneuropathy, unspecified: Secondary | ICD-10-CM | POA: Diagnosis not present

## 2022-08-05 DIAGNOSIS — L97511 Non-pressure chronic ulcer of other part of right foot limited to breakdown of skin: Secondary | ICD-10-CM | POA: Diagnosis not present

## 2022-08-06 ENCOUNTER — Encounter: Payer: Medicare PPO | Admitting: Physical Medicine and Rehabilitation

## 2022-08-06 ENCOUNTER — Encounter: Payer: Self-pay | Admitting: Internal Medicine

## 2022-08-06 ENCOUNTER — Other Ambulatory Visit: Payer: Self-pay | Admitting: Family

## 2022-08-07 ENCOUNTER — Encounter: Payer: Self-pay | Admitting: Physical Medicine and Rehabilitation

## 2022-08-07 ENCOUNTER — Other Ambulatory Visit: Payer: Self-pay | Admitting: Family

## 2022-08-07 MED ORDER — FESOTERODINE FUMARATE ER 8 MG PO TB24: 8.0000 mg | ORAL_TABLET | Freq: Every day | ORAL | 3 refills | Status: DC

## 2022-08-10 DIAGNOSIS — G8250 Quadriplegia, unspecified: Secondary | ICD-10-CM | POA: Diagnosis not present

## 2022-08-10 DIAGNOSIS — G8254 Quadriplegia, C5-C7 incomplete: Secondary | ICD-10-CM | POA: Diagnosis not present

## 2022-08-11 DIAGNOSIS — G8250 Quadriplegia, unspecified: Secondary | ICD-10-CM | POA: Diagnosis not present

## 2022-08-11 DIAGNOSIS — G8254 Quadriplegia, C5-C7 incomplete: Secondary | ICD-10-CM | POA: Diagnosis not present

## 2022-08-12 DIAGNOSIS — L89621 Pressure ulcer of left heel, stage 1: Secondary | ICD-10-CM | POA: Diagnosis not present

## 2022-08-12 DIAGNOSIS — L89611 Pressure ulcer of right heel, stage 1: Secondary | ICD-10-CM | POA: Diagnosis not present

## 2022-08-12 DIAGNOSIS — G629 Polyneuropathy, unspecified: Secondary | ICD-10-CM | POA: Diagnosis not present

## 2022-08-12 DIAGNOSIS — Z79899 Other long term (current) drug therapy: Secondary | ICD-10-CM | POA: Diagnosis not present

## 2022-08-12 DIAGNOSIS — L97511 Non-pressure chronic ulcer of other part of right foot limited to breakdown of skin: Secondary | ICD-10-CM | POA: Diagnosis not present

## 2022-08-12 DIAGNOSIS — I890 Lymphedema, not elsewhere classified: Secondary | ICD-10-CM | POA: Diagnosis not present

## 2022-08-12 DIAGNOSIS — L89320 Pressure ulcer of left buttock, unstageable: Secondary | ICD-10-CM | POA: Diagnosis not present

## 2022-08-12 DIAGNOSIS — G8250 Quadriplegia, unspecified: Secondary | ICD-10-CM | POA: Diagnosis not present

## 2022-08-13 ENCOUNTER — Encounter: Payer: Self-pay | Admitting: Physical Medicine and Rehabilitation

## 2022-08-13 ENCOUNTER — Encounter: Payer: Medicare PPO | Attending: Physical Medicine and Rehabilitation | Admitting: Physical Medicine and Rehabilitation

## 2022-08-13 VITALS — Ht 64.0 in | Wt 116.8 lb

## 2022-08-13 DIAGNOSIS — I951 Orthostatic hypotension: Secondary | ICD-10-CM | POA: Insufficient documentation

## 2022-08-13 DIAGNOSIS — Z993 Dependence on wheelchair: Secondary | ICD-10-CM

## 2022-08-13 DIAGNOSIS — M792 Neuralgia and neuritis, unspecified: Secondary | ICD-10-CM

## 2022-08-13 DIAGNOSIS — R252 Cramp and spasm: Secondary | ICD-10-CM

## 2022-08-13 DIAGNOSIS — G8254 Quadriplegia, C5-C7 incomplete: Secondary | ICD-10-CM | POA: Diagnosis not present

## 2022-08-13 MED ORDER — OXCARBAZEPINE 300 MG PO TABS: 300.0000 mg | ORAL_TABLET | Freq: Every day | ORAL | 5 refills | Status: DC

## 2022-08-13 MED ORDER — FLUDROCORTISONE ACETATE 0.1 MG PO TABS: 0.1000 mg | ORAL_TABLET | Freq: Every day | ORAL | 1 refills | Status: DC

## 2022-08-13 NOTE — Patient Instructions (Signed)
Pt is a 72 yr old L handed female with hx of incomplete quadriplegia- 2/14 [id number]- fleeing the police in [place] on passenger 100 (high speed) miles/hour,  [name]; neurogenic bowel and bladder and spasticity; [name], [name] 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 Made 90 days supply and then 1 refill  2.  Can try Trilpetal- Oxcarmazepine- 300 mg nightly x 1 week, then 600 mg nightly-    3. Wait to start Trileptal for 2-3 weeks- and then have PCP check BMP- basic metabolic panel in 3-4 weeks after starting Trileptal.    4. Wound is being cared for appropriately and healing well so far- should be healed by August when patient is going ot [facility] for day rehab.    5. F/U in 2-3 months- has appointment in May- can either push it out a couple of weeks or keep it- double appt- SCI  6. Spasticity is stable- no change to Baclofen dosing.
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
## 2022-08-15 DIAGNOSIS — N390 Urinary tract infection, site not specified: Secondary | ICD-10-CM | POA: Diagnosis not present

## 2022-08-16 DIAGNOSIS — G8250 Quadriplegia, unspecified: Secondary | ICD-10-CM | POA: Diagnosis not present

## 2022-08-16 DIAGNOSIS — G8254 Quadriplegia, C5-C7 incomplete: Secondary | ICD-10-CM | POA: Diagnosis not present

## 2022-08-19 DIAGNOSIS — L89320 Pressure ulcer of left buttock, unstageable: Secondary | ICD-10-CM | POA: Diagnosis not present

## 2022-08-19 DIAGNOSIS — G8250 Quadriplegia, unspecified: Secondary | ICD-10-CM | POA: Diagnosis not present

## 2022-08-19 DIAGNOSIS — G8254 Quadriplegia, C5-C7 incomplete: Secondary | ICD-10-CM | POA: Diagnosis not present

## 2022-08-19 DIAGNOSIS — Z79899 Other long term (current) drug therapy: Secondary | ICD-10-CM | POA: Diagnosis not present

## 2022-08-19 DIAGNOSIS — I890 Lymphedema, not elsewhere classified: Secondary | ICD-10-CM | POA: Diagnosis not present

## 2022-08-19 DIAGNOSIS — R634 Abnormal weight loss: Secondary | ICD-10-CM | POA: Diagnosis not present

## 2022-08-19 DIAGNOSIS — L89322 Pressure ulcer of left buttock, stage 2: Secondary | ICD-10-CM | POA: Diagnosis not present

## 2022-08-19 DIAGNOSIS — L89611 Pressure ulcer of right heel, stage 1: Secondary | ICD-10-CM | POA: Diagnosis not present

## 2022-08-19 DIAGNOSIS — G629 Polyneuropathy, unspecified: Secondary | ICD-10-CM | POA: Diagnosis not present

## 2022-08-19 DIAGNOSIS — L89621 Pressure ulcer of left heel, stage 1: Secondary | ICD-10-CM | POA: Diagnosis not present

## 2022-08-19 DIAGNOSIS — L97511 Non-pressure chronic ulcer of other part of right foot limited to breakdown of skin: Secondary | ICD-10-CM | POA: Diagnosis not present

## 2022-08-22 ENCOUNTER — Encounter: Payer: Self-pay | Admitting: Physical Medicine and Rehabilitation

## 2022-08-23 DIAGNOSIS — G8250 Quadriplegia, unspecified: Secondary | ICD-10-CM | POA: Diagnosis not present

## 2022-08-23 DIAGNOSIS — G8254 Quadriplegia, C5-C7 incomplete: Secondary | ICD-10-CM | POA: Diagnosis not present

## 2022-09-01 DIAGNOSIS — G8254 Quadriplegia, C5-C7 incomplete: Secondary | ICD-10-CM | POA: Diagnosis not present

## 2022-09-01 DIAGNOSIS — G8250 Quadriplegia, unspecified: Secondary | ICD-10-CM | POA: Diagnosis not present

## 2022-09-02 DIAGNOSIS — L89322 Pressure ulcer of left buttock, stage 2: Secondary | ICD-10-CM | POA: Diagnosis not present

## 2022-09-02 DIAGNOSIS — G8250 Quadriplegia, unspecified: Secondary | ICD-10-CM | POA: Diagnosis not present

## 2022-09-02 DIAGNOSIS — L97511 Non-pressure chronic ulcer of other part of right foot limited to breakdown of skin: Secondary | ICD-10-CM | POA: Diagnosis not present

## 2022-09-02 DIAGNOSIS — G8254 Quadriplegia, C5-C7 incomplete: Secondary | ICD-10-CM | POA: Diagnosis not present

## 2022-09-02 DIAGNOSIS — Z79899 Other long term (current) drug therapy: Secondary | ICD-10-CM | POA: Diagnosis not present

## 2022-09-02 DIAGNOSIS — L89621 Pressure ulcer of left heel, stage 1: Secondary | ICD-10-CM | POA: Diagnosis not present

## 2022-09-02 DIAGNOSIS — I890 Lymphedema, not elsewhere classified: Secondary | ICD-10-CM | POA: Diagnosis not present

## 2022-09-02 DIAGNOSIS — G629 Polyneuropathy, unspecified: Secondary | ICD-10-CM | POA: Diagnosis not present

## 2022-09-02 DIAGNOSIS — R634 Abnormal weight loss: Secondary | ICD-10-CM | POA: Diagnosis not present

## 2022-09-02 DIAGNOSIS — L89611 Pressure ulcer of right heel, stage 1: Secondary | ICD-10-CM | POA: Diagnosis not present

## 2022-09-04 ENCOUNTER — Encounter: Payer: Self-pay | Admitting: Internal Medicine

## 2022-09-06 DIAGNOSIS — G8250 Quadriplegia, unspecified: Secondary | ICD-10-CM | POA: Diagnosis not present

## 2022-09-06 DIAGNOSIS — G8254 Quadriplegia, C5-C7 incomplete: Secondary | ICD-10-CM | POA: Diagnosis not present

## 2022-09-07 NOTE — Telephone Encounter (Signed)
Print out  ok to fill out form and send in rx for  her bladder medication fesoteradine   90 days  refill x 1

## 2022-09-08 ENCOUNTER — Other Ambulatory Visit: Payer: Self-pay

## 2022-09-08 MED ORDER — FESOTERODINE FUMARATE ER 8 MG PO TB24: 8.0000 mg | ORAL_TABLET | Freq: Every day | ORAL | 1 refills | Status: DC

## 2022-09-09 DIAGNOSIS — I890 Lymphedema, not elsewhere classified: Secondary | ICD-10-CM | POA: Diagnosis not present

## 2022-09-09 DIAGNOSIS — L89611 Pressure ulcer of right heel, stage 1: Secondary | ICD-10-CM | POA: Diagnosis not present

## 2022-09-09 DIAGNOSIS — R634 Abnormal weight loss: Secondary | ICD-10-CM | POA: Diagnosis not present

## 2022-09-09 DIAGNOSIS — G8250 Quadriplegia, unspecified: Secondary | ICD-10-CM | POA: Diagnosis not present

## 2022-09-09 DIAGNOSIS — G629 Polyneuropathy, unspecified: Secondary | ICD-10-CM | POA: Diagnosis not present

## 2022-09-09 DIAGNOSIS — L89323 Pressure ulcer of left buttock, stage 3: Secondary | ICD-10-CM | POA: Diagnosis not present

## 2022-09-09 DIAGNOSIS — Z79899 Other long term (current) drug therapy: Secondary | ICD-10-CM | POA: Diagnosis not present

## 2022-09-09 DIAGNOSIS — L89322 Pressure ulcer of left buttock, stage 2: Secondary | ICD-10-CM | POA: Diagnosis not present

## 2022-09-09 DIAGNOSIS — G8254 Quadriplegia, C5-C7 incomplete: Secondary | ICD-10-CM | POA: Diagnosis not present

## 2022-09-09 DIAGNOSIS — L89621 Pressure ulcer of left heel, stage 1: Secondary | ICD-10-CM | POA: Diagnosis not present

## 2022-09-09 DIAGNOSIS — L97511 Non-pressure chronic ulcer of other part of right foot limited to breakdown of skin: Secondary | ICD-10-CM | POA: Diagnosis not present

## 2022-09-14 DIAGNOSIS — G8254 Quadriplegia, C5-C7 incomplete: Secondary | ICD-10-CM | POA: Diagnosis not present

## 2022-09-14 DIAGNOSIS — G8250 Quadriplegia, unspecified: Secondary | ICD-10-CM | POA: Diagnosis not present

## 2022-09-16 DIAGNOSIS — L89323 Pressure ulcer of left buttock, stage 3: Secondary | ICD-10-CM | POA: Diagnosis not present

## 2022-09-16 DIAGNOSIS — I10 Essential (primary) hypertension: Secondary | ICD-10-CM | POA: Diagnosis not present

## 2022-09-16 DIAGNOSIS — L97511 Non-pressure chronic ulcer of other part of right foot limited to breakdown of skin: Secondary | ICD-10-CM | POA: Diagnosis not present

## 2022-09-16 DIAGNOSIS — L89621 Pressure ulcer of left heel, stage 1: Secondary | ICD-10-CM | POA: Diagnosis not present

## 2022-09-16 DIAGNOSIS — L89611 Pressure ulcer of right heel, stage 1: Secondary | ICD-10-CM | POA: Diagnosis not present

## 2022-09-16 DIAGNOSIS — G629 Polyneuropathy, unspecified: Secondary | ICD-10-CM | POA: Diagnosis not present

## 2022-09-16 DIAGNOSIS — I890 Lymphedema, not elsewhere classified: Secondary | ICD-10-CM | POA: Diagnosis not present

## 2022-09-16 DIAGNOSIS — L0889 Other specified local infections of the skin and subcutaneous tissue: Secondary | ICD-10-CM | POA: Diagnosis not present

## 2022-09-16 DIAGNOSIS — G8254 Quadriplegia, C5-C7 incomplete: Secondary | ICD-10-CM | POA: Diagnosis not present

## 2022-09-16 DIAGNOSIS — R634 Abnormal weight loss: Secondary | ICD-10-CM | POA: Diagnosis not present

## 2022-09-16 DIAGNOSIS — L89322 Pressure ulcer of left buttock, stage 2: Secondary | ICD-10-CM | POA: Diagnosis not present

## 2022-09-16 DIAGNOSIS — G8250 Quadriplegia, unspecified: Secondary | ICD-10-CM | POA: Diagnosis not present

## 2022-09-17 DIAGNOSIS — N319 Neuromuscular dysfunction of bladder, unspecified: Secondary | ICD-10-CM | POA: Diagnosis not present

## 2022-09-17 DIAGNOSIS — R338 Other retention of urine: Secondary | ICD-10-CM | POA: Diagnosis not present

## 2022-09-17 DIAGNOSIS — G8254 Quadriplegia, C5-C7 incomplete: Secondary | ICD-10-CM | POA: Diagnosis not present

## 2022-09-20 DIAGNOSIS — G8254 Quadriplegia, C5-C7 incomplete: Secondary | ICD-10-CM | POA: Diagnosis not present

## 2022-09-20 DIAGNOSIS — G8250 Quadriplegia, unspecified: Secondary | ICD-10-CM | POA: Diagnosis not present

## 2022-09-23 DIAGNOSIS — I10 Essential (primary) hypertension: Secondary | ICD-10-CM | POA: Diagnosis not present

## 2022-09-23 DIAGNOSIS — G8250 Quadriplegia, unspecified: Secondary | ICD-10-CM | POA: Diagnosis not present

## 2022-09-23 DIAGNOSIS — G8254 Quadriplegia, C5-C7 incomplete: Secondary | ICD-10-CM | POA: Diagnosis not present

## 2022-09-23 DIAGNOSIS — R634 Abnormal weight loss: Secondary | ICD-10-CM | POA: Diagnosis not present

## 2022-09-23 DIAGNOSIS — I890 Lymphedema, not elsewhere classified: Secondary | ICD-10-CM | POA: Diagnosis not present

## 2022-09-23 DIAGNOSIS — L89323 Pressure ulcer of left buttock, stage 3: Secondary | ICD-10-CM | POA: Diagnosis not present

## 2022-09-23 DIAGNOSIS — L97511 Non-pressure chronic ulcer of other part of right foot limited to breakdown of skin: Secondary | ICD-10-CM | POA: Diagnosis not present

## 2022-09-23 DIAGNOSIS — L89621 Pressure ulcer of left heel, stage 1: Secondary | ICD-10-CM | POA: Diagnosis not present

## 2022-09-23 DIAGNOSIS — G629 Polyneuropathy, unspecified: Secondary | ICD-10-CM | POA: Diagnosis not present

## 2022-09-23 DIAGNOSIS — L89611 Pressure ulcer of right heel, stage 1: Secondary | ICD-10-CM | POA: Diagnosis not present

## 2022-09-25 ENCOUNTER — Other Ambulatory Visit: Payer: Self-pay | Admitting: Family

## 2022-09-27 ENCOUNTER — Encounter: Payer: Self-pay | Admitting: Physical Medicine and Rehabilitation

## 2022-09-27 DIAGNOSIS — G8250 Quadriplegia, unspecified: Secondary | ICD-10-CM | POA: Diagnosis not present

## 2022-09-27 DIAGNOSIS — G8254 Quadriplegia, C5-C7 incomplete: Secondary | ICD-10-CM | POA: Diagnosis not present

## 2022-09-29 ENCOUNTER — Other Ambulatory Visit: Payer: Self-pay

## 2022-09-29 MED ORDER — ASCORBIC ACID 1000 MG PO TABS: 1000.0000 mg | ORAL_TABLET | Freq: Four times a day (QID) | ORAL | 0 refills | Status: DC

## 2022-09-29 MED ORDER — METHENAMINE HIPPURATE 1 G PO TABS: 1.0000 g | ORAL_TABLET | Freq: Two times a day (BID) | ORAL | 0 refills | Status: DC

## 2022-09-30 DIAGNOSIS — G629 Polyneuropathy, unspecified: Secondary | ICD-10-CM | POA: Diagnosis not present

## 2022-09-30 DIAGNOSIS — L89611 Pressure ulcer of right heel, stage 1: Secondary | ICD-10-CM | POA: Diagnosis not present

## 2022-09-30 DIAGNOSIS — L89323 Pressure ulcer of left buttock, stage 3: Secondary | ICD-10-CM | POA: Diagnosis not present

## 2022-09-30 DIAGNOSIS — R634 Abnormal weight loss: Secondary | ICD-10-CM | POA: Diagnosis not present

## 2022-09-30 DIAGNOSIS — I890 Lymphedema, not elsewhere classified: Secondary | ICD-10-CM | POA: Diagnosis not present

## 2022-09-30 DIAGNOSIS — L97511 Non-pressure chronic ulcer of other part of right foot limited to breakdown of skin: Secondary | ICD-10-CM | POA: Diagnosis not present

## 2022-09-30 DIAGNOSIS — I10 Essential (primary) hypertension: Secondary | ICD-10-CM | POA: Diagnosis not present

## 2022-09-30 DIAGNOSIS — L89621 Pressure ulcer of left heel, stage 1: Secondary | ICD-10-CM | POA: Diagnosis not present

## 2022-09-30 DIAGNOSIS — G8250 Quadriplegia, unspecified: Secondary | ICD-10-CM | POA: Diagnosis not present

## 2022-09-30 DIAGNOSIS — G8254 Quadriplegia, C5-C7 incomplete: Secondary | ICD-10-CM | POA: Diagnosis not present

## 2022-10-04 DIAGNOSIS — G8254 Quadriplegia, C5-C7 incomplete: Secondary | ICD-10-CM | POA: Diagnosis not present

## 2022-10-04 DIAGNOSIS — G8250 Quadriplegia, unspecified: Secondary | ICD-10-CM | POA: Diagnosis not present

## 2022-10-05 ENCOUNTER — Encounter: Payer: Self-pay | Admitting: Internal Medicine

## 2022-10-06 MED ORDER — GABAPENTIN 600 MG PO TABS: 1200.0000 mg | ORAL_TABLET | Freq: Three times a day (TID) | ORAL | 1 refills | Status: DC

## 2022-10-07 DIAGNOSIS — L89611 Pressure ulcer of right heel, stage 1: Secondary | ICD-10-CM | POA: Diagnosis not present

## 2022-10-07 DIAGNOSIS — R634 Abnormal weight loss: Secondary | ICD-10-CM | POA: Diagnosis not present

## 2022-10-07 DIAGNOSIS — G8254 Quadriplegia, C5-C7 incomplete: Secondary | ICD-10-CM | POA: Diagnosis not present

## 2022-10-07 DIAGNOSIS — I890 Lymphedema, not elsewhere classified: Secondary | ICD-10-CM | POA: Diagnosis not present

## 2022-10-07 DIAGNOSIS — L97511 Non-pressure chronic ulcer of other part of right foot limited to breakdown of skin: Secondary | ICD-10-CM | POA: Diagnosis not present

## 2022-10-07 DIAGNOSIS — G629 Polyneuropathy, unspecified: Secondary | ICD-10-CM | POA: Diagnosis not present

## 2022-10-07 DIAGNOSIS — G8250 Quadriplegia, unspecified: Secondary | ICD-10-CM | POA: Diagnosis not present

## 2022-10-07 DIAGNOSIS — L89621 Pressure ulcer of left heel, stage 1: Secondary | ICD-10-CM | POA: Diagnosis not present

## 2022-10-07 DIAGNOSIS — L89323 Pressure ulcer of left buttock, stage 3: Secondary | ICD-10-CM | POA: Diagnosis not present

## 2022-10-07 DIAGNOSIS — I10 Essential (primary) hypertension: Secondary | ICD-10-CM | POA: Diagnosis not present

## 2022-10-08 DIAGNOSIS — G8250 Quadriplegia, unspecified: Secondary | ICD-10-CM | POA: Diagnosis not present

## 2022-10-08 DIAGNOSIS — G8254 Quadriplegia, C5-C7 incomplete: Secondary | ICD-10-CM | POA: Diagnosis not present

## 2022-10-11 ENCOUNTER — Encounter (HOSPITAL_BASED_OUTPATIENT_CLINIC_OR_DEPARTMENT_OTHER): Payer: Medicare PPO | Attending: General Surgery | Admitting: Internal Medicine

## 2022-10-11 DIAGNOSIS — W228XXA Striking against or struck by other objects, initial encounter: Secondary | ICD-10-CM | POA: Diagnosis not present

## 2022-10-11 DIAGNOSIS — T798XXA Other early complications of trauma, initial encounter: Secondary | ICD-10-CM | POA: Diagnosis not present

## 2022-10-11 DIAGNOSIS — L89323 Pressure ulcer of left buttock, stage 3: Secondary | ICD-10-CM | POA: Insufficient documentation

## 2022-10-11 DIAGNOSIS — G8250 Quadriplegia, unspecified: Secondary | ICD-10-CM | POA: Insufficient documentation

## 2022-10-12 ENCOUNTER — Ambulatory Visit: Payer: Medicare PPO | Attending: Physical Medicine and Rehabilitation | Admitting: Physical Therapy

## 2022-10-12 ENCOUNTER — Other Ambulatory Visit: Payer: Self-pay

## 2022-10-12 ENCOUNTER — Ambulatory Visit: Payer: Medicare PPO | Admitting: Occupational Therapy

## 2022-10-12 ENCOUNTER — Encounter: Payer: Self-pay | Admitting: Physical Therapy

## 2022-10-12 ENCOUNTER — Encounter: Payer: Self-pay | Admitting: Occupational Therapy

## 2022-10-12 VITALS — BP 100/55 | HR 83

## 2022-10-12 DIAGNOSIS — R293 Abnormal posture: Secondary | ICD-10-CM

## 2022-10-12 DIAGNOSIS — R208 Other disturbances of skin sensation: Secondary | ICD-10-CM | POA: Insufficient documentation

## 2022-10-12 DIAGNOSIS — R29898 Other symptoms and signs involving the musculoskeletal system: Secondary | ICD-10-CM | POA: Insufficient documentation

## 2022-10-12 DIAGNOSIS — M6281 Muscle weakness (generalized): Secondary | ICD-10-CM

## 2022-10-12 DIAGNOSIS — R29818 Other symptoms and signs involving the nervous system: Secondary | ICD-10-CM

## 2022-10-12 DIAGNOSIS — R278 Other lack of coordination: Secondary | ICD-10-CM | POA: Diagnosis not present

## 2022-10-12 DIAGNOSIS — R2689 Other abnormalities of gait and mobility: Secondary | ICD-10-CM

## 2022-10-12 DIAGNOSIS — G8254 Quadriplegia, C5-C7 incomplete: Secondary | ICD-10-CM | POA: Diagnosis not present

## 2022-10-12 DIAGNOSIS — G8253 Quadriplegia, C5-C7 complete: Secondary | ICD-10-CM
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
## 2022-10-12 NOTE — Progress Notes (Signed)
KADANCE, MCCUISTION F ([id number]) [id number] Page 1 of 7 Visit Report for 10/11/2022 Allergy List Details Patient Name: Date of Service: [name], [name] 10/11/2022 9:15 A M Medical Record Number: [id number] Patient Account Number: [id number] Date of Birth/Sex: Treating RN: 1951/03/06 (72 y.o. [name] Primary Care Johnchristopher Sarvis: Berniece Andreas Other Clinician: Referring Treesa Mccully: Treating Appollonia Klee/Extender: Skeet Latch in Treatment: 0 Allergies Active Allergies No Known Allergies Allergy Notes Electronic Signature(s) Signed: 10/11/2022 5:00:20 PM By: Shawn Stall RN, BSN Entered By: Shawn Stall on 10/11/2022 09:28:02 -------------------------------------------------------------------------------- Arrival Information Details Patient Name: Date of Service: Carmen NDO [name], Carmen [name] 10/11/2022 9:15 A M Medical Record Number: [id number] Patient Account Number: [id number] Date of Birth/Sex: Treating RN: 24-Sep-1950 (72 y.o. [name], [name] Primary Care Rody Keadle: Berniece Andreas Other Clinician: Referring Arvon Schreiner: Treating Raed Schalk/Extender: Skeet Latch in Treatment: 0 Visit Information Patient Arrived: Wheel Chair Arrival Time: 09:22 Accompanied By: nurse Transfer Assistance: Transfer Board Patient Identification Verified: Yes Secondary Verification Process Completed: Yes Patient Requires Transmission-Based Precautions: No Patient Has Alerts: No Electronic Signature(s) Signed: 10/11/2022 5:00:20 PM By: Shawn Stall RN, BSN Entered By: Shawn Stall on 10/11/2022 09:24:32 -------------------------------------------------------------------------------- Clinic Level of Care Assessment Details Patient Name: Date of Service: Carmen NDO [name], Carmen [name] 10/11/2022 9:15 A M Medical Record Number: [id number] Patient Account Number: [id number] Date of Birth/Sex: Treating RN: 08-Aug-1950 (72 y.o. [name] Primary Care Carinna Newhart: Berniece Andreas Other Clinician: Referring Trevian Hayashida: Treating Yoshua Geisinger/Extender: Skeet Latch in Treatment: 0 Clinic Level of Care Assessment Items TOOL 1 Quantity Score X- 1 0 Use when EandM and Procedure is performed on INITIAL visit ASSESSMENTS - Nursing Assessment / Reassessment X- 1 20 General Physical Exam (combine w/ comprehensive assessment (listed just below) when performed on new pt. evals) X- 1 25 Comprehensive Assessment (HX, ROS, Risk Assessments, Wounds Hx, etc.) [name], [name] ([id number]) [id number] Page 2 of 7 ASSESSMENTS - Wound and Skin Assessment / Reassessment X- 1 10 Dermatologic / Skin Assessment (not related to wound area) ASSESSMENTS - Ostomy and/or Continence Assessment and Care []  - 0 Incontinence Assessment and Management []  - 0 Ostomy Care Assessment and Management (repouching, etc.) PROCESS - Coordination of Care []  - 0 Simple Patient / Family Education for ongoing care X- 1 20 Complex (extensive) Patient / Family Education for ongoing care X- 1 10 Staff obtains Chiropractor, Records, T Results / Process Orders est X- 1 10 Staff telephones HHA, Nursing Homes / Clarify orders / etc []  - 0 Routine Transfer to another Facility (non-emergent condition) []  - 0 Routine Hospital Admission (non-emergent condition) X- 1 15 New Admissions / Manufacturing engineer / Ordering NPWT Apligraf, etc. , []  - 0 Emergency Hospital Admission (emergent condition) PROCESS - Special Needs []  - 0 Pediatric / Minor Patient Management []  - 0 Isolation Patient Management []  - 0 Hearing / Language / Visual special needs []  - 0 Assessment of Community assistance (transportation, D/C planning, etc.) []  - 0 Additional assistance / Altered mentation X- 1 15 Support Surface(s) Assessment (bed, cushion, seat, etc.) INTERVENTIONS - Miscellaneous []  - 0 External ear exam []  - 0 Patient  Transfer (multiple staff / Nurse, adult / Similar devices) []  - 0 Simple Staple / Suture removal (25 or less) []  - 0 Complex Staple / Suture removal (26 or more) []  - 0 Hypo/Hyperglycemic Management (do not check if billed separately) []  - 0 Ankle / Brachial Index (ABI) - do not check if billed  separately Has the patient been seen at the hospital within the last three years: Yes Total Score: 125 Level Of Care: New/Established - Level 4 Electronic Signature(s) Signed: 10/11/2022 5:00:20 PM By: Shawn Stall RN, BSN Entered By: Shawn Stall on 10/11/2022 09:58:12 -------------------------------------------------------------------------------- Encounter Discharge Information Details Patient Name: Date of Service: Carmen NDO [name], Carmen [name] 10/11/2022 9:15 A M Medical Record Number: [id number] Patient Account Number: [id number] Date of Birth/Sex: Treating RN: 12/26/1950 (72 y.o. [name] Primary Care Cherene Dobbins: Berniece Andreas Other Clinician: Referring Dalynn Jhaveri: Treating Keyleigh Manninen/Extender: Skeet Latch in Treatment: 0 Encounter Discharge Information Items Post Procedure Vitals Discharge Condition: Stable Temperature (F): 97.7 Ambulatory Status: Wheelchair Pulse (bpm): 78 Discharge Destination: Home Respiratory Rate (breaths/min): 18 Transportation: Private Auto Blood Pressure (mmHg): 101/56 Accompanied By: nurse-caregiver Schedule Follow-up Appointment: Yes Clinical Summary of Care: ESLI, CLEMENTS ([id number]) [phone].pdf Page 3 of 7 Electronic Signature(s) Signed: 10/11/2022 5:00:20 PM By: Shawn Stall RN, BSN Entered By: Shawn Stall on 10/11/2022 09:58:57 -------------------------------------------------------------------------------- Lower Extremity Assessment Details Patient Name: Date of Service: Carmen NDO [name], Carmen [name] 10/11/2022 9:15 A M Medical Record Number: [id number] Patient Account Number: [id number] Date of  Birth/Sex: Treating RN: Feb 17, 1951 (72 y.o. [name] Primary Care Emaya Preston: Berniece Andreas Other Clinician: Referring Sharnette Kitamura: Treating Shany Marinez/Extender: Skeet Latch in Treatment: 0 Electronic Signature(s) Signed: 10/11/2022 5:00:20 PM By: Shawn Stall RN, BSN Entered By: Shawn Stall on 10/11/2022 09:32:02 -------------------------------------------------------------------------------- Multi Wound Chart Details Patient Name: Date of Service: Carmen NDO [name], Carmen [name] 10/11/2022 9:15 A M Medical Record Number: [id number] Patient Account Number: [id number] Date of Birth/Sex: Treating RN: 04-Feb-1951 (72 y.o. F) Primary Care Ridhima Golberg: Berniece Andreas Other Clinician: Referring Britain Anagnos: Treating Jadrian Bulman/Extender: Skeet Latch in Treatment: 0 Vital Signs Height(in): 64 Pulse(bpm): 78 Weight(lbs): 115 Blood Pressure(mmHg): 101/56 Body Mass Index(BMI): 19.7 Temperature(F): 97.7 Respiratory Rate(breaths/min): 18 [1:Photos:] [[name]/A:[name]/A] Left Gluteus [name]/A [name]/A Wound Location: Pressure Injury [name]/A [name]/A Wounding Event: Pressure Ulcer [name]/A [name]/A Primary Etiology: Cataracts, Lymphedema, [name]/A [name]/A Comorbid History: Hypotension, Quadriplegia 07/15/2022 [name]/A [name]/A Date Acquired: 0 [name]/A [name]/A Weeks of Treatment: Open [name]/A [name]/A Wound Status: No [name]/A [name]/A Wound Recurrence: 0.6x0.8x1.3 [name]/A [name]/A Measurements L x W x D (cm) 0.377 [name]/A [name]/A A (cm) : rea 0.49 [name]/A [name]/A Volume (cm) : Category/Stage III [name]/A [name]/A Classification: Medium [name]/A [name]/A Exudate A mount: Serosanguineous [name]/A [name]/A Exudate Type: red, brown [name]/A [name]/A Exudate Color: Distinct, outline attached [name]/A [name]/A Wound Margin: Medium (34-66%) [name]/A [name]/A Granulation A mount: Red, Pink [name]/A [name]/A Granulation Quality: Medium (34-66%) [name]/A [name]/A Necrotic A mount: Fat Layer (Subcutaneous Tissue): Yes [name]/A [name]/A Exposed Structures: Fascia: No Tendon: No Muscle: No Joint: No Bone: No [name], [name]  ([id number]) [id number] Page 4 of 7 Small (1-33%) [name]/A [name]/A Epithelialization: Debridement - Excisional [name]/A [name]/A Debridement: 09:45 [name]/A [name]/A Pre-procedure Verification/Time Out Taken: Lidocaine 4% Topical Solution [name]/A [name]/A Pain Control: Subcutaneous, Slough [name]/A [name]/A Tissue Debrided: Skin/Subcutaneous Tissue [name]/A [name]/A Level: 0.38 [name]/A [name]/A Debridement A (sq cm): rea Curette [name]/A [name]/A Instrument: Minimum [name]/A [name]/A Bleeding: Pressure [name]/A [name]/A Hemostasis A chieved: 0 [name]/A [name]/A Procedural Pain: 0 [name]/A [name]/A Post Procedural Pain: Procedure was tolerated well [name]/A [name]/A Debridement Treatment Response: 0.6x0.8x1.3 [name]/A [name]/A Post Debridement Measurements L x W x D (cm) 0.49 [name]/A [name]/A Post Debridement Volume: (cm) Category/Stage III [name]/A [name]/A Post Debridement Stage: Excoriation: No [name]/A [name]/A Periwound Skin Texture: Induration: No Callus: No Crepitus: No Rash: No Scarring: No Maceration: No [name]/A  [name]/A Periwound Skin Moisture: Dry/Scaly: No Atrophie Blanche: No [name]/A [name]/A Periwound Skin Color: Cyanosis: No Ecchymosis: No Erythema: No Hemosiderin Staining: No Mottled: No Pallor: No Rubor: No Debridement [name]/A [name]/A Procedures Performed: Treatment Notes Electronic Signature(s) Signed: 10/11/2022 10:32:07 AM By: Geralyn Corwin DO Entered By: Geralyn Corwin on 10/11/2022 09:56:51 -------------------------------------------------------------------------------- Multi-Disciplinary Care Plan Details Patient Name: Date of Service: Carmen NDO [name], Carmen [name] 10/11/2022 9:15 A M Medical Record Number: [id number] Patient Account Number: [id number] Date of Birth/Sex: Treating RN: [birth year] (72 y.o. [name], [name] Primary Care Rayvon Brandvold: Berniece Andreas Other Clinician: Referring Kamyrah Feeser: Treating Sharel Behne/Extender: Skeet Latch in Treatment: 0 Active Inactive Nutrition Nursing Diagnoses: Potential for alteratiion in Nutrition/Potential for imbalanced  nutrition Goals: Patient/caregiver agrees to and verbalizes understanding of need to obtain nutritional consultation Date Initiated: 10/11/2022 Target Resolution Date: 10/21/2022 Goal Status: Active Interventions: Provide education on elevated blood sugars and impact on wound healing Provide education on nutrition Treatment Activities: Patient referred to Primary Care Physician for further nutritional evaluation : 10/11/2022 Notes: Orientation to the [facility] [place] ([id number]) [id number] Page 5 of 7 Nursing Diagnoses: Knowledge deficit related to the wound healing center program Goals: Patient/caregiver will verbalize understanding of the Wound Healing Center Program Date Initiated: 10/11/2022 Target Resolution Date: 10/22/2022 Goal Status: Active Interventions: Provide education on orientation to the wound center Notes: Pressure Nursing Diagnoses: Potential for impaired tissue integrity related to pressure, friction, moisture, and shear Goals: Patient will remain free from development of additional pressure ulcers Date Initiated: 10/11/2022 Target Resolution Date: 10/22/2022 Goal Status: Active Patient/caregiver will verbalize understanding of pressure ulcer management Date Initiated: 10/11/2022 Target Resolution Date: 10/22/2022 Goal Status: Active Interventions: Assess: immobility, friction, shearing, incontinence upon admission and as needed Assess offloading mechanisms upon admission and as needed Provide education on pressure ulcers Notes: Wound/Skin Impairment Nursing Diagnoses: Knowledge deficit related to ulceration/compromised skin integrity Goals: Patient/caregiver will verbalize understanding of skin care regimen Date Initiated: 10/11/2022 Target Resolution Date: 10/22/2022 Goal Status: Active Interventions: Assess patient/caregiver ability to perform ulcer/skin care regimen upon admission and as needed Assess  ulceration(s) every visit Provide education on ulcer and skin care Treatment Activities: Skin care regimen initiated : 10/11/2022 Topical wound management initiated : 10/11/2022 Notes: Electronic Signature(s) Signed: 10/11/2022 5:00:20 PM By: Shawn Stall RN, BSN Entered By: Shawn Stall on 10/11/2022 09:51:51 -------------------------------------------------------------------------------- Patient/Caregiver Education Details Patient Name: Date of Service: Carmen NDO [name], Carmen [name] 4/29/2024andnbsp9:15 A M Medical Record Number: [id number] Patient Account Number: [id number] Date of Birth/Gender: Treating RN: 09/28/1950 (72 y.o. [name], [name] Primary Care Physician: Berniece Andreas Other Clinician: Referring Physician: Treating Physician/Extender: Skeet Latch in Treatment: 0 Education Assessment Education Provided To: Patient [name], [name] ([id number]) [id number] Page 6 of 7 Education Topics Provided Wound/Skin Impairment: Handouts: Caring for Your Ulcer Methods: Explain/Verbal Responses: Reinforcements needed Electronic Signature(s) Signed: 10/11/2022 5:00:20 PM By: Shawn Stall RN, BSN Entered By: Shawn Stall on 10/11/2022 09:51:57 -------------------------------------------------------------------------------- Wound Assessment Details Patient Name: Date of Service: Carmen NDO [name], Carmen [name] 10/11/2022 9:15 A M Medical Record Number: [id number] Patient Account Number: [id number] Date of Birth/Sex: Treating RN: 10-Oct-1950 (72 y.o. [name] Primary Care Frank Novelo: Berniece Andreas Other Clinician: Referring Nashayla Telleria: Treating Kynzlee Hucker/Extender: Skeet Latch in Treatment: 0 Wound Status Wound Number: 1 Primary Etiology: Pressure Ulcer Wound Location: Left Gluteus Wound Status: Open Wounding Event: Pressure Injury Comorbid History: Cataracts, Lymphedema, Hypotension, Quadriplegia Date Acquired:  07/15/2022 Weeks Of Treatment: 0 Clustered  Wound: No Photos Wound Measurements Length: (cm) 0.6 Width: (cm) 0.8 Depth: (cm) 1.3 Area: (cm) 0.377 Volume: (cm) 0.49 % Reduction in Area: % Reduction in Volume: Epithelialization: Small (1-33%) Tunneling: No Undermining: No Wound Description Classification: Category/Stage III Wound Margin: Distinct, outline attached Exudate Amount: Medium Exudate Type: Serosanguineous Exudate Color: red, brown Foul Odor After Cleansing: No Slough/Fibrino Yes Wound Bed Granulation Amount: Medium (34-66%) Exposed Structure Granulation Quality: Red, Pink Fascia Exposed: No Necrotic Amount: Medium (34-66%) Fat Layer (Subcutaneous Tissue) Exposed: Yes Necrotic Quality: Adherent Slough Tendon Exposed: No Muscle Exposed: No Joint Exposed: No Bone Exposed: No Periwound Skin Texture Texture Color No Abnormalities Noted: No No Abnormalities Noted: No Callus: No [address][name], Carmen ([id number]) [id number] Page 7 of 7 Crepitus: No Cyanosis: No Excoriation: No Ecchymosis: No Induration: No Erythema: No Rash: No Hemosiderin Staining: No Scarring: No Mottled: No Pallor: No Moisture Rubor: No No Abnormalities Noted: No Dry / Scaly: No Maceration: No Treatment Notes Wound #1 (Gluteus) Wound Laterality: Left Cleanser Vashe 5.8 (oz) Discharge Instruction: Cleanse the wound with Vashe prior to applying a clean dressing using gauze sponges, not tissue or cotton balls. Peri-Wound Care Skin Prep Discharge Instruction: Use skin prep as directed Topical Primary Dressing Hydrofera Blue Classic Foam Rope Dressing, 9x6 (mm/in) Discharge Instruction: Moisten with saline prior to packing Secondary Dressing Bordered Gauze, 4x4 in Discharge Instruction: Apply over primary dressing as directed. Secured With Compression Wrap Compression Stockings Facilities manager) Signed: 10/11/2022  5:00:20 PM By: Shawn Stall RN, BSN Entered By: Shawn Stall on 10/11/2022 09:41:54 -------------------------------------------------------------------------------- Vitals Details Patient Name: Date of Service: Carmen NDO [name], Carmen [name] 10/11/2022 9:15 A M Medical Record Number: [id number] Patient Account Number: [id number] Date of Birth/Sex: Treating RN: 12-23-1950 (72 y.o. [name], [name] Primary Care Aunya Lemler: Berniece Andreas Other Clinician: Referring Mayukha Symmonds: Treating Beckey Polkowski/Extender: Skeet Latch in Treatment: 0 Vital Signs Time Taken: 09:24 Temperature (F): 97.7 Height (in): 64 Pulse (bpm): 78 Source: Stated Respiratory Rate (breaths/min): 18 Weight (lbs): 115 Blood Pressure (mmHg): 101/56 Source: Stated Reference Range: 80 - 120 mg / dl Body Mass Index (BMI): 19.7 Electronic Signature(s) Signed: 10/11/2022 5:00:20 PM By: Shawn Stall RN, BSN Entered By: Shawn Stall on 10/11/2022 09:27:46
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
## 2022-10-12 NOTE — Therapy (Unsigned)
OUTPATIENT PHYSICAL THERAPY NEURO EVALUATION   Patient Name: [name] MRN: [id number] DOB:[birth year], 72 y.o., female Today's Date: 10/12/2022   PCP: [name], MD REFERRING PROVIDER: [name], MD  END OF SESSION:  PT End of Session - 10/12/22 1110     Visit Number 1    Authorization Type [employer] MEDICARE             Past Medical History:  Diagnosis Date   CERVICAL POLYP 03/11/2008   Qualifier: Diagnosis of  By: [name] MD, [name]    Colon polyps 2005   on colonscopy [name]   Fibroid 2004   Per Dr. [name]   History of shingles    face and mouth   Hx of skin cancer, basal cell    Rosacea    Sciatica of left side 09/28/2013   Scoliosis    noted on mri done for back pain   Past Surgical History:  Procedure Laterality Date   BUNIONECTOMY     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   Nerve pain 05/03/2022   Medication monitoring encounter 01/08/2022   Neurogenic bladder 10/11/2021   Urinary incontinence 10/11/2021   ESBL (extended spectrum beta-lactamase) producing bacteria infection 10/09/2021   Recurrent UTI 10/09/2021   Quadriplegia, C5-C7 incomplete (HCC) 01/16/2021   History of spinal fracture 01/16/2021   Suprapubic catheter (HCC) 01/16/2021   Encounter for routine gynecological examination 09/28/2013   Onychomycosis 09/28/2013   Foot deformity, acquired 03/26/2012   Encounter for preventive health examination 12/25/2010   ROSACEA 08/25/2009   Disturbance in sleep behavior 03/11/2008   SKIN CANCER, HX OF 03/11/2008   DYSURIA, HX OF 03/11/2008   Hyperlipidemia 02/10/2007   CERVICALGIA 02/10/2007    ONSET DATE: 09/28/2022 (most recent referral)  REFERRING DIAG: G82.54 (ICD-10-CM) - Quadriplegia, C5-C7, incomplete (HCC)  THERAPY DIAG:  Muscle weakness (generalized)  Quadriplegia, C5-C7  incomplete (HCC)  Abnormal posture  Other disturbances of skin sensation  Other lack of coordination  Other abnormalities of gait and mobility  Rationale for Evaluation and Treatment: Rehabilitation  SUBJECTIVE:                                                                                                                                                                                             SUBJECTIVE STATEMENT: Patient returns to this clinic.  Patient has a wound on her bottom that they are waiting for it to heal prior to her returning to [facility] for a day program.  She feels she has gained some strength in her right lower leg in the toes and glut areas, she also feels her right knee is developing more control and would like to continue this.  She wants to work on her core strength-demonstrates lateral LOB in wheelchair.  She primarily uses slideboard.  She was doing neuro PT in [place] for about 10 months 3x per week and just finished last week.  She was working in a Sales promotion account executive and working towards similar goals. Pt accompanied by:  live-in nurse [name]  PERTINENT HISTORY: ***  PAIN:  Are you having pain? Yes: NPRS scale: 3/10 Pain location: forearms to fingertips Pain description: constant, pinprick/tingling Aggravating factors: nighttime Relieving factors: nothing, sometimes medicines  PRECAUTIONS: Fall; suprapubic catheter (she wants to get this removed meaning she needs to get to and from the toilet); she has had minor heat sensation when needing to complete her bowel program-possible AD?  WEIGHT BEARING RESTRICTIONS: No-pt was in stander at most recent therapy in [place] last week.  She will have a bone density done soon w/ Dr. [name].  FALLS: Has patient fallen in last 6 months? No  LIVING ENVIRONMENT: Lives with: {OPRC lives with:25569::"lives with their family"} Lives in: {Lives in:25570} Stairs: {opstairs:27293} Has following equipment at home: {Assistive  devices:23999}  PLOF: Requires assistive device for independence, Needs assistance with ADLs, Needs assistance with homemaking, Needs assistance with gait, and Needs assistance with transfers  PATIENT GOALS: "Make my right leg work."  Stand and pivot so she can more easily access a toilet.  OBJECTIVE:   DIAGNOSTIC FINDINGS: ***  COGNITION: Overall cognitive status: Within functional limits for tasks assessed   SENSATION: Light touch: Diminished ability to distinguish sharp and dull, but able to distinguish light touch from injury level down accurately  COORDINATION: ***  EDEMA:  Well managed w/ lymphatic massage and compression stockings.  MUSCLE TONE: Pt has intermittent clonus during transfers.  POSTURE: rounded shoulders and posterior pelvic tilt  LOWER EXTREMITY ROM:     Passive  Right Eval Left Eval  Hip flexion    Hip extension    Hip abduction    Hip adduction    Hip internal rotation    Hip external rotation    Knee flexion    Knee extension    Ankle dorsiflexion    Ankle plantarflexion    Ankle inversion    Ankle eversion     (Blank rows = not tested)  LOWER EXTREMITY MMT:    MMT Right Eval Left Eval  Hip flexion    Hip extension    Hip abduction    Hip adduction    Hip internal rotation    Hip external rotation    Knee flexion    Knee extension    Ankle dorsiflexion    Ankle plantarflexion    Ankle inversion    Ankle eversion    (Blank rows = not tested)  BED MOBILITY:  Sit to supine Mod A Supine to sit Mod A Rolling to Right Mod A Rolling to Left Mod A Undulating mattress for wound management; she would like to continue working on sitting up independently, she has been working on rolling, needs less assistance w/ this when someone props her leg into hooklying; would like something to help her pull her left leg to her butt for stretching as well as bed mobility.  FUNCTIONAL TESTS:  None relevant to pt's current functional level and  abilities.  PATIENT SURVEYS:  {rehab surveys:24030}  TODAY'S TREATMENT:  DATE: ***    PATIENT EDUCATION: Education details: *** Person educated: {Person educated:25204} Education method: {Education Method:25205} Education comprehension: {Education Comprehension:25206}  HOME EXERCISE PROGRAM: ***  GOALS: Goals reviewed with patient? {yes/no:20286}  SHORT TERM GOALS: Target date: ***  *** Baseline: Goal status: {GOALSTATUS:25110}  2.  *** Baseline:  Goal status: {GOALSTATUS:25110}  3.  *** Baseline:  Goal status: {GOALSTATUS:25110}  4.  *** Baseline:  Goal status: {GOALSTATUS:25110}  5.  *** Baseline:  Goal status: {GOALSTATUS:25110}  6.  *** Baseline:  Goal status: {GOALSTATUS:25110}  LONG TERM GOALS: Target date: ***  *** Baseline:  Goal status: {GOALSTATUS:25110}  2.  *** Baseline:  Goal status: {GOALSTATUS:25110}  3.  *** Baseline:  Goal status: {GOALSTATUS:25110}  4.  *** Baseline:  Goal status: {GOALSTATUS:25110}  5.  *** Baseline:  Goal status: {GOALSTATUS:25110}  6.  *** Baseline:  Goal status: {GOALSTATUS:25110}  ASSESSMENT:  CLINICAL IMPRESSION: Patient is a *** y.o. *** who was seen today for physical therapy evaluation and treatment for ***.   OBJECTIVE IMPAIRMENTS: {opptimpairments:25111}.   ACTIVITY LIMITATIONS: {activitylimitations:27494}  PARTICIPATION LIMITATIONS: {participationrestrictions:25113}  PERSONAL FACTORS: {Personal factors:25162} are also affecting patient's functional outcome.   REHAB POTENTIAL: {rehabpotential:25112}  CLINICAL DECISION MAKING: {clinical decision making:25114}  EVALUATION COMPLEXITY: {Evaluation complexity:25115}  PLAN:  PT FREQUENCY: 2x/week  PT DURATION: 8 weeks  PLANNED INTERVENTIONS: {rehab planned interventions:25118::"Therapeutic  exercises","Therapeutic activity","Neuromuscular re-education","Balance training","Gait training","Patient/Family education","Self Care","Joint mobilization"}  PLAN FOR NEXT SESSION: ***   Sadie Haber, PT 10/12/2022, 11:11 AM

## 2022-10-13 ENCOUNTER — Encounter: Payer: Self-pay | Admitting: Physical Medicine and Rehabilitation

## 2022-10-13 ENCOUNTER — Encounter: Payer: Medicare PPO | Attending: Physical Medicine and Rehabilitation | Admitting: Physical Medicine and Rehabilitation

## 2022-10-13 VITALS — BP 81/52 | HR 81 | Ht 64.0 in | Wt 116.8 lb

## 2022-10-13 DIAGNOSIS — Z993 Dependence on wheelchair: Secondary | ICD-10-CM

## 2022-10-13 DIAGNOSIS — G8254 Quadriplegia, C5-C7 incomplete: Secondary | ICD-10-CM | POA: Insufficient documentation

## 2022-10-13 DIAGNOSIS — R252 Cramp and spasm: Secondary | ICD-10-CM | POA: Insufficient documentation

## 2022-10-13 DIAGNOSIS — I951 Orthostatic hypotension: Secondary | ICD-10-CM | POA: Diagnosis not present

## 2022-10-13 DIAGNOSIS — Z9359 Other cystostomy status: Secondary | ICD-10-CM | POA: Diagnosis not present

## 2022-10-13 NOTE — Progress Notes (Addendum)
Subjective:    Patient ID: [name], female    DOB: Aug 17, 1950, 72 y.o.   MRN: [id number]  HPI  Pt is a 72 yr old L handed female with hx of incomplete quadriplegia- 2/14 2022- fleeing the police in [place] on passenger 100 (high speed) miles/hour,  [name]; neurogenic bowel and bladder and spasticity; [name], [name] Here for f/u on Incomplete quadriplegia   [name]- caregiver  Just came back from [place] less than 1 week ago.  Gone for 10 months  Started Florinef  did 3 doses and got groggy with it- couldn't tolerate it.   Already tried Midodrine- made her groggy-  Usually in fog, cannot function.    Didn't try Trileptal at all.  Didn't try due to Side effects /severe depression with it.  Has a suicide in her family from a SSRI.    Wound is getting better Leaving [place]- 70% healed- already been to wound care up here- they weren't quite as possible- edges rolled under- and they are debriding to make it "raw" so will continue to heal.   Size of dime or smaller- can barely get q-tip in it- a little tunnel 0.4 cm deep.   [facility] said won't see her with wound in place- need to it be healed before they will see her.   Was at Rehab yesterday- [facility]-   Baclofen- was having some issues with leg spasms at night- doing 30 mg in AM and midday and 60 mg QHS at night- that seems to be helping -=thinks this has fixed the issue.    Sleeping supine- on alternating pressure mattress- from [employer]-- so has helped spasms as well as wound.   BP is running 80s and Orthostatic hypotension in stander sometimes.  One day went as low as 70/40s- gets lightheaded on the stander, doesn't feel lightheaded usually, unless sitting up when getting OOB in AM.    At the weight she's wanted to be at- been stable for 3-4 weeks. Is 115 lbs.   Has pretty significant scoliosis- at level SCI pain- feel likes a plastic corset is more noticeable.   They bent the frame on  his w/c- and they need to replace it.  W/C is 72 years old.  Worry that's it's interfering with breathing-   Has bladder trained really well- has appointment with Dr Hall Busing- coming up next week.  SPC- clamps off and then voids some via urethra. On Command if full enough.   Having every day bowel movement, but usually was q3-4 days- before PO ABX.    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  no  Physicians involved in your care Any changes since last visit?  no   Family History  Problem Relation Age of Onset   Hypertension Father  Osteoporosis Other    Breast cancer Neg Hx    Social History   Socioeconomic History   Marital status: Married    Spouse name: Not on file   Number of children: Not on file   Years of education: Not on file   Highest education level: Doctorate  Occupational History   Not on file  Tobacco Use   Smoking status: Never   Smokeless tobacco: Never  Vaping Use   Vaping Use: Never used  Substance and Sexual Activity   Alcohol use: Not Currently    Alcohol/week: 7.0 standard drinks of alcohol    Types: 7 Glasses of wine per week   Drug use: Yes    Comment: wine at night   Sexual activity: Not on file  Other Topics Concern   Not on file  Social  History Narrative   Married   Spouse had CABG    [employer] professor PhD   [employer] [place]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   Has moved to [place]   hh of 2    2 cats      Social Determinants of Health   Financial Resource Strain: Low Risk  (03/04/2022)   Overall Financial Resource Strain (CARDIA)    Difficulty of Paying Living Expenses: Not hard at all  Food Insecurity: No Food Insecurity (03/04/2022)   Hunger Vital Sign    Worried About Running Out of Food in the Last Year: Never true    Ran Out of Food in the Last Year: Never true  Transportation Needs: No Transportation Needs (03/04/2022)   PRAPARE - Administrator, Civil Service (Medical): No    Lack of Transportation (Non-Medical): No  Physical Activity: Unknown (03/04/2022)   Exercise Vital Sign    Days of Exercise per Week: Patient declined    Minutes of Exercise per Session: 0 min  Recent Concern: Physical Activity - Inactive (03/02/2022)   Exercise Vital Sign    Days of Exercise per Week: 0 days    Minutes of Exercise per Session: 0 min  Stress: No Stress Concern Present (03/04/2022)   [employer] of Occupational Health - Occupational Stress Questionnaire    Feeling of Stress : Only a little  Social Connections: Moderately Integrated (03/04/2022)   Social Connection and Isolation Panel [NHANES]    Frequency of Communication with Friends and Family: Twice a week    Frequency of Social Gatherings with Friends and Family: Once a week    Attends Religious Services: Never    Database administrator or Organizations: Yes    Attends Banker Meetings: 1 to 4 times per year    Marital Status: Married   Past Surgical History:  Procedure Laterality Date   BUNIONECTOMY     Past Medical History:  Diagnosis Date   CERVICAL POLYP 03/11/2008   Qualifier: Diagnosis of  By: [name] MD, [name]    Colon polyps 2005   on colonscopy [name]   Fibroid 2004   Per Dr. [name]   History of shingles    face and mouth   Hx of skin  cancer, basal cell    Rosacea    Sciatica of left side 09/28/2013   Scoliosis    noted on mri done for back pain   BP (!) 81/52   Pulse 81   Ht 5\' 4"  (1.626 m)   Wt 116 lb 12.8 oz (53 kg) Comment: last recorded  SpO2 97%   BMI 20.05 kg/m  Opioid Risk Score:   Fall Risk Score:  `1  Depression screen [facility] 2/9     10/13/2022    9:40 AM 08/13/2022   12:36 PM 03/02/2022   12:05 PM 10/09/2021    1:51 PM 02/24/2021    2:39 PM 12/10/2019    2:20 PM 04/14/2017    8:20 AM  Depression screen PHQ 2/9  Decreased Interest 0 0 0 0 0 0 0  Down, Depressed, Hopeless 0 0 0 0 0 0 0  PHQ - 2 Score 0 0 0 0 0 0 0  Altered sleeping      0   Tired, decreased energy      0   Change in appetite      0   Feeling bad or failure about yourself       0   Trouble concentrating      0   Moving slowly or fidgety/restless      0   Suicidal thoughts      0   PHQ-9 Score      0   Difficult doing work/chores      Not difficult at all      Review of Systems  Constitutional: Negative.   HENT: Negative.    Eyes: Negative.   Respiratory: Negative.    Cardiovascular: Negative.   Gastrointestinal:  Positive for constipation.  Endocrine: Negative.   Genitourinary:        Suprapubic catheter  Musculoskeletal:  Positive for gait problem.       Spasms  Skin:  Positive for wound.  Allergic/Immunologic: Negative.   Neurological:  Positive for dizziness.  Hematological: Negative.   Psychiatric/Behavioral: Negative.    All other systems reviewed and are negative.      Objective:   Physical Exam Awake, alert, appropriate, in power w/c; very slim, NAD Accompanied by [name]- caregiver Oropharynx very deep red- and around uvula also deep red       Assessment & Plan:   Pt is a 72 yr old L handed female with hx of incomplete quadriplegia- 2/14 2022- fleeing the police in [place] on passenger 100 (high speed) miles/hour,  [name]; neurogenic bowel and bladder and spasticity; [name], has low BP and  HLD. Here for f/u on Incomplete quadriplegia   I'm fine with pt's Baclofen dosing- 30 mg in AM and afternoon and 60 mg nightly- max dose of Baclofen is 160mg /day, so cannot go above that, FYI. Will con't dosing.    2. I don't need to treat low BP unless she's symptomatic- suggest salt with her water- to maintain fluid in her.    3. Going to use [employer]- for new w/c- since hers got bent by airline. I strongly suggest an active back, maybe with ROHO on back as well- because she's so slim as well as scoliosis, I truly feel this would be a better choice for her back on power w/c. Talked with Harrie Jeans- and gave him my recs- If don't hear anything in next week, give me a call.    4. Has appointment with Dr Hall Busing - Urology   5. Has had grogginess with all nerve pain meds-  - is maxxed out on gabapentin. 1200 mg TID.  Gets from PCP.   6.  Suggest ENT for throat redness and hoarse voice. Since has been 2-3 months - don't a specific suggestion.   7. F/U in 3 months- double appt-[facility]  8. Gave info and discussed SCI support grou- gave  her info- wrote it out.    I spent a total of 43   minutes on total care today- >50% coordination of care- due to d/w with [employer]- about new w/c- as well as back for new w/c; as well as d/w pt about spasticity and nerve pain meds

## 2022-10-13 NOTE — Progress Notes (Signed)
Chief Complaint  Patient presents with   Annual Exam    HPI: Patient  [name]  72 y.o. 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  Clamping catheter  Dr Katrinka Blazing in [facility]  Optometrist  hearing ok  Resp still feels restricted .  From  ? Speech change and when standing  frame uces inentive spriometer  Gi normal eating   above sitting wound  high protein per day .  Swallowing ok .  Mobility can lift some no good finger dexterity  5  left handed right leweakness and left some movement  mechanical stander  Using ambien with help HLD  atorvastatin no se noted  Health Maintenance  Topic Date Due   COVID-19 Vaccine (7 - 2023-24 season) 11/24/2022   INFLUENZA VACCINE  01/13/2023   Medicare Annual Wellness (AWV)  03/03/2023   MAMMOGRAM  10/28/2023   Fecal DNA (Cologuard)  10/11/2024   DTaP/Tdap/Td (3 - Td or Tdap) 04/17/2032   Pneumonia Vaccine 28+ Years old  Completed   DEXA SCAN  Completed   Hepatitis C Screening  Completed   Zoster Vaccines- Shingrix  Completed   HPV VACCINES  Aged Out   COLONOSCOPY (Pts 45-62yrs Insurance coverage will need to be confirmed)  Discontinued   Health Maintenance Review LIFESTYLE:  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 No blood in the stool. No significant GU symptoms. SKIN/HEME: ,no acute skin rashes suspicious lesions or bleeding.   Pressure area llback ok no foot  ulcers No lymphadenopathy, nodules, masses.  NEURO/ PSYCH: UE but dexterity dec  left leg can lift and bear weight but paresis right le   IMM/ Allergy: No unusual infections.  Allergy .   REST of 12 system review negative except as per HPI   Past Medical History:  Diagnosis Date   CERVICAL POLYP 03/11/2008   Qualifier: Diagnosis of  By: [name] MD, [name]    Colon polyps 2005   on colonscopy [name]   Fibroid 2004   Per Dr. [name]   History of shingles    face and mouth   Hx of skin cancer, basal cell    Rosacea    Sciatica of left side 09/28/2013   Scoliosis    noted on mri done for back pain    Past Surgical History:  Procedure Laterality Date   BUNIONECTOMY      Family History  Problem Relation Age of Onset   Hypertension Father    Osteoporosis Other    Breast cancer Neg Hx     Social History   Socioeconomic History   Marital status: Married    Spouse name: Not on file   Number of children: Not on file   Years of education: Not on file   Highest education level: Doctorate  Occupational History   Not on file  Tobacco Use   Smoking  status: Never   Smokeless tobacco: Never  Vaping Use   Vaping Use: Never used  Substance and Sexual Activity   Alcohol use: Not Currently    Alcohol/week: 7.0 standard drinks of alcohol    Types: 7 Glasses of wine per week   Drug use: Yes    Comment: wine at night   Sexual activity: Not on file  Other Topics Concern   Not on file  Social History Narrative   Married   Spouse had CABG    [employer] professor PhD   [employer]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   Has moved to [place]   hh of 2    2 cats      Social Determinants of Health   Financial Resource Strain: Low Risk  (10/14/2022)   Overall Financial Resource Strain (CARDIA)    Difficulty of Paying Living Expenses: Not hard at all  Food Insecurity: No Food Insecurity (10/14/2022)   Hunger Vital Sign    Worried About Running Out of Food in the Last Year: Never  true    Ran Out of Food in the Last Year: Never true  Transportation Needs: No Transportation Needs (10/14/2022)   PRAPARE - Administrator, Civil Service (Medical): No    Lack of Transportation (Non-Medical): No  Physical Activity: Insufficiently Active (10/14/2022)   Exercise Vital Sign    Days of Exercise per Week: 2 days    Minutes of Exercise per Session: 60 min  Stress: No Stress Concern Present (10/14/2022)   [employer] of Occupational Health - Occupational Stress Questionnaire    Feeling of Stress : Not at all  Social Connections: Moderately Integrated (10/14/2022)   Social Connection and Isolation Panel [NHANES]    Frequency of Communication with Friends and Family: More than three times a week    Frequency of Social Gatherings with Friends and Family: Once a week    Attends Religious Services: Never    Database administrator or Organizations: Yes    Attends Engineer, structural: More than 4 times per year    Marital Status: Married    Outpatient Medications Prior to Visit  Medication Sig Dispense Refill   ascorbic acid (VITAMIN C) 1000 MG tablet Take 1 tablet (1,000 mg total) by mouth in the morning, at noon, in the evening, and at bedtime. 120 tablet 0   atorvastatin (LIPITOR) 20 MG tablet TAKE 1 TABLET BY MOUTH EVERY DAY 90 tablet 1   baclofen (LIORESAL) 20 MG tablet Take 2 tablets (40 mg total) by mouth 4 (four) times daily. For spasticity- take 5-8 pills/day- max is 8 pills/day no matter what 240 each 5   bisacodyl (DULCOLAX) 10 MG suppository Place 10 mg rectally as needed for moderate constipation. Insert one suppository per rectum with each bowel program procedure.     cholecalciferol (VITAMIN D3) 25 MCG (1000 UNIT) tablet Take 1,000 Units by mouth daily. 2000u     [employer] COENZYME Q-10 100 MG capsule Take 100 mg by mouth daily.      Docusate Sodium (DSS) 100 MG CAPS Take by mouth.     fesoterodine (TOVIAZ) 8 MG TB24 tablet Take 1 tablet (8 mg total) by  mouth daily. 90 tablet 1   gabapentin (NEURONTIN) 600 MG tablet Take 2 tablets (1,200 mg total) by mouth 3 (three) times daily. 270 tablet 1   methenamine (HIPREX) 1 g tablet Take 1 tablet (1 g total) by mouth 2 (two) times daily with a meal. 60  tablet 0   metroNIDAZOLE (METROGEL) 1 % gel APPLY TOPICALLY EVERY DAY 60 g 10   mirabegron ER (MYRBETRIQ) 25 MG TB24 tablet Take 50 mg by mouth.     Multiple Vitamins-Minerals (CENTRUM SILVER ULTRA WOMENS PO) Take by mouth.     mupirocin ointment (BACTROBAN) 2 % Apply 1 Application topically 2 (two) times daily. 30 g 2   naproxen sodium (ALEVE) 220 MG tablet Take 220 mg by mouth. Tablet p.o per package directions as needed for pain     nortriptyline (PAMELOR) 25 MG capsule TAKE 2 CAPSULES BY MOUTH DAILY. 180 capsule 1   SANTYL 250 UNIT/GM ointment      UNABLE TO FIND Med Name: Saline Enema Per rectum per package directions as needed for relief of constipation.     zolpidem (AMBIEN) 10 MG tablet TAKE 1/2-1 TABLET BY MOUTH AT BEDTIME AS NEEDED FOR SLEEP 30 tablet 2   SENNA CO by Combination route. Sennosides 8.6mg  tab p.o per package directions daily as needed to promote bowel movement. (Patient not taking: Reported on 10/14/2022)     urea (CARMOL) 40 % CREA Apply daily to callused areas of feet (Patient not taking: Reported on 10/14/2022) 120 g 3   No facility-administered medications prior to visit.     EXAM:  BP 90/66 (BP Location: Right Arm, Patient Position: Sitting, Cuff Size: Normal)   Pulse 81   Temp 98.1 F (36.7 C) (Oral)   Ht 5\' 4"  (1.626 m)   Wt 115 lb (52.2 kg)   SpO2 97%   BMI 19.74 kg/m   Body mass index is 19.74 kg/m. Wt Readings from Last 3 Encounters:  10/14/22 115 lb (52.2 kg)  10/13/22 116 lb 12.8 oz (53 kg)  08/13/22 116 lb 12.8 oz (53 kg)    Physical Exam: Vital signs reviewed ONG:EXBM is a well-developed well-nourished alert cooperative    who appearsr stated age in no acute distress.  In WC  nl speech and  interaction  HEENT: normocephalic atraumatic , Eyes: PERRL EOM's full, conjunctiva clear, Nares: paten,t no deformity discharge or tenderness., Ears: no deformity EAC's clear TMs with normal landmarks. Mouth: clear OP, no lesions, edema.  Moist mucous membranes. Dentition in adequate repair. NECK: supple without masses, thyromegaly or bruits. CHEST/PULM:  Clear to auscultation and percussion breath sounds equal no wheeze , rales or rhonchi. No chest wall deformities or tenderness.  CV: PMI is nondisplaced, S1 S2 no gallops, murmurs, rubs. Peripheral pulses are present without delay.No JVD .  ABDOMEN: exam sitting  No guard or rebound, no hepato splenomegal no CVA tenderness.  Extremtities:  No clubbing cyanosis ;dec muscle mass  able to lift left leg not right  movement of both ue and finger use  NEURO:  Oriented x3, cranial nerves 3-12 appear to be intact, SKIN: No acute rashes normal turgor, color, no bruising or petechiae. Some distal rubor purplish feet dependent ( compression stockings off) PSYCH: Oriented, good eye contact, no obvious depression anxiety, cognition and judgment appear normal. LN: no cervical adenopathy  Lab Results  Component Value Date   WBC 3.6 (L) 10/14/2022   HGB 13.8 10/14/2022   HCT 40.5 10/14/2022   PLT 215.0 10/14/2022   GLUCOSE 83 10/14/2022   CHOL 136 10/14/2022   TRIG 64.0 10/14/2022   HDL 53.80 10/14/2022   LDLDIRECT 162.3 03/21/2012   LDLCALC 70 10/14/2022   ALT 37 (H) 10/14/2022   AST 32 10/14/2022   NA 141 10/14/2022   K 4.6 10/14/2022  CL 102 10/14/2022   CREATININE 0.40 10/14/2022   BUN 26 (H) 10/14/2022   CO2 31 10/14/2022   TSH 1.72 10/14/2022    BP Readings from Last 3 Encounters:  10/14/22 90/66  10/13/22 (!) 81/52  10/12/22 (!) 100/55   Update labs   reviewed with patient   ASSESSMENT AND PLAN:  Discussed the following assessment and plan:    ICD-10-CM   1. Encounter for preventive health examination  Z00.00     2.  Encounter for screening mammogram for malignant neoplasm of breast  Z12.31 MM 3D SCREENING MAMMOGRAM BILATERAL BREAST    3. Wheelchair dependence  Z99.3 CBC with Differential/Platelet    Comprehensive metabolic panel    Lipid panel    TSH    T4, Free    4. Suprapubic catheter (HCC)  Z93.59 CBC with Differential/Platelet    Comprehensive metabolic panel    Lipid panel    TSH    T4, Free    5. Quadriplegia, C5-C7 incomplete (HCC)  G82.54 CBC with Differential/Platelet    Comprehensive metabolic panel    Lipid panel    TSH    T4, Free    DG Bone Density    6. Mixed hyperlipidemia  E78.2 Lipid panel    7. Medication monitoring encounter  Z51.81 CBC with Differential/Platelet    Comprehensive metabolic panel    TSH    T4, Free    8. Sore throat  J02.9 CBC with Differential/Platelet    Comprehensive metabolic panel    Lipid panel    TSH    T4, Free    9. Estrogen deficiency  E28.39 DG Bone Density    Will refill ambien functional status has improved but still quadriparetic . Working on bladder help. Will update dexa scan.  At risk from lack of weight bearing Suspect sore throat could be related to silent reflux  and avoid pill takng before supine and then can add pepcid bid   Return in about 1 year (around 10/14/2023) for depending on results  6-12 months .  Patient Care Team: Nello Corro, [name], MD as PCP - General Levi Aland, MD (Obstetrics and Gynecology) Meryl Dare, MD (Gastroenterology) Madelaine Etienne, Alvia Grove, MD (Inactive) Patient Instructions  Good to see you today  Try taking evening pills up right for 30 min before reclining. Thencan try adding pepcid 20 mg twice a day.  If  persistent or progressive then can see ENT for opinion.  Lab today . Will put in order for dexa at breast center.  Mammogram every 1-2  years.   [name]. Steve Youngberg M.D.

## 2022-10-13 NOTE — Patient Instructions (Signed)
Pt is a 72 yr old L handed female with hx of incomplete quadriplegia- 2/14 2022- fleeing the police in [place] on passenger 100 (high speed) miles/hour,  [name]; neurogenic bowel and bladder and spasticity; [name], [name] Here for f/u on Incomplete quadriplegia   I'm fine with pt's Baclofen dosing- 30 mg in AM and afternoon and 60 mg nightly- max dose of Baclofen is 160mg /day, so cannot go above that, FYI. Will con't dosing.    2. I don't need to treat low BP unless she's symptomatic- suggest salt with her water- to maintain fluid in her.    3. Going to use [employer]- for new w/c- since hers got bent by airline. I strongly suggest an active back, maybe with ROHO on back as well- because she's so slim as well as scoliosis, I truly feel this would be a better choice for her back on power w/c. Talked with Harrie Jeans- and gave him my recs- If don't hear anything in next week, give me a call. We can change the  back afterwards. Would need formal seating eval-    4. Has appointment with Dr Hall Busing - Urology   5. Has had grogginess with all nerve pain meds-  - is maxxed out on gabapentin. 1200 mg TID.  Gets from PCP.   6.  Suggest ENT for throat redness and hoarse voice. Since has been 2-3 months - don't a specific suggestion.   7. F/U in 3 months- double appt-[facility]

## 2022-10-14 ENCOUNTER — Encounter: Payer: Self-pay | Admitting: Internal Medicine

## 2022-10-14 ENCOUNTER — Ambulatory Visit (INDEPENDENT_AMBULATORY_CARE_PROVIDER_SITE_OTHER): Payer: Medicare PPO | Admitting: Internal Medicine

## 2022-10-14 VITALS — BP 90/66 | HR 81 | Temp 98.1°F | Ht 64.0 in | Wt 115.0 lb

## 2022-10-14 DIAGNOSIS — J029 Acute pharyngitis, unspecified: Secondary | ICD-10-CM

## 2022-10-14 DIAGNOSIS — E2839 Other primary ovarian failure: Secondary | ICD-10-CM

## 2022-10-14 DIAGNOSIS — Z9359 Other cystostomy status: Secondary | ICD-10-CM

## 2022-10-14 DIAGNOSIS — Z5181 Encounter for therapeutic drug level monitoring: Secondary | ICD-10-CM

## 2022-10-14 DIAGNOSIS — G8254 Quadriplegia, C5-C7 incomplete: Secondary | ICD-10-CM | POA: Diagnosis not present

## 2022-10-14 DIAGNOSIS — E782 Mixed hyperlipidemia: Secondary | ICD-10-CM

## 2022-10-14 DIAGNOSIS — Z0000 Encounter for general adult medical examination without abnormal findings: Secondary | ICD-10-CM

## 2022-10-14 DIAGNOSIS — Z1231 Encounter for screening mammogram for malignant neoplasm of breast: Secondary | ICD-10-CM

## 2022-10-14 DIAGNOSIS — Z993 Dependence on wheelchair: Secondary | ICD-10-CM | POA: Diagnosis not present

## 2022-10-14 DIAGNOSIS — Z0001 Encounter for general adult medical examination with abnormal findings: Secondary | ICD-10-CM

## 2022-10-14 LAB — COMPREHENSIVE METABOLIC PANEL
ALT: 37 U/L — ABNORMAL HIGH (ref 0–35)
AST: 32 U/L (ref 0–37)
Albumin: 4 g/dL (ref 3.5–5.2)
Alkaline Phosphatase: 68 U/L (ref 39–117)
BUN: 26 mg/dL — ABNORMAL HIGH (ref 6–23)
CO2: 31 mEq/L (ref 19–32)
Calcium: 9.5 mg/dL (ref 8.4–10.5)
Chloride: 102 mEq/L (ref 96–112)
Creatinine, Ser: 0.4 mg/dL (ref 0.40–1.20)
GFR: 99.67 mL/min (ref >60.00)
Glucose, Bld: 83 mg/dL (ref 70–99)
Potassium: 4.6 mEq/L (ref 3.5–5.1)
Sodium: 141 mEq/L (ref 135–145)
Total Bilirubin: 0.4 mg/dL (ref 0.2–1.2)
Total Protein: 7 g/dL (ref 6.0–8.3)

## 2022-10-14 LAB — CBC WITH DIFFERENTIAL/PLATELET
Basophils Absolute: 0 10*3/uL (ref 0.0–0.1)
Basophils Relative: 1.3 % (ref 0.0–3.0)
Eosinophils Absolute: 0.1 10*3/uL (ref 0.0–0.7)
Eosinophils Relative: 2.5 % (ref 0.0–5.0)
HCT: 40.5 % (ref 36.0–46.0)
Hemoglobin: 13.8 g/dL (ref 12.0–15.0)
Lymphocytes Relative: 32.4 % (ref 12.0–46.0)
Lymphs Abs: 1.2 10*3/uL (ref 0.7–4.0)
MCHC: 34 g/dL (ref 30.0–36.0)
MCV: 94.1 fl (ref 78.0–100.0)
Monocytes Absolute: 0.3 10*3/uL (ref 0.1–1.0)
Monocytes Relative: 7.8 % (ref 3.0–12.0)
Neutro Abs: 2 10*3/uL (ref 1.4–7.7)
Neutrophils Relative %: 56 % (ref 43.0–77.0)
Platelets: 215 10*3/uL (ref 150.0–400.0)
RBC: 4.31 Mil/uL (ref 3.87–5.11)
RDW: 13.6 % (ref 11.5–15.5)
WBC: 3.6 10*3/uL — ABNORMAL LOW (ref 4.0–10.5)

## 2022-10-14 LAB — LIPID PANEL
Cholesterol: 136 mg/dL (ref 0–200)
HDL: 53.8 mg/dL (ref >39.00)
LDL Cholesterol: 70 mg/dL (ref 0–99)
NonHDL: 82.4
Total CHOL/HDL Ratio: 3
Triglycerides: 64 mg/dL (ref 0.0–149.0)
VLDL: 12.8 mg/dL (ref 0.0–40.0)

## 2022-10-14 LAB — TSH: TSH: 1.72 u[IU]/mL (ref 0.35–5.50)

## 2022-10-14 LAB — T4, FREE: Free T4: 0.97 ng/dL (ref 0.60–1.60)

## 2022-10-14 MED ORDER — ZOLPIDEM TARTRATE 10 MG PO TABS: ORAL_TABLET | ORAL | 2 refills | Status: DC

## 2022-10-14 NOTE — Patient Instructions (Signed)
Good to see you today  Try taking evening pills up right for 30 min before reclining. Thencan try adding pepcid 20 mg twice a day.  If  persistent or progressive then can see ENT for opinion.  Lab today . Will put in order for dexa at breast center.  Mammogram every 1-2  years.

## 2022-10-14 NOTE — Telephone Encounter (Signed)
Form was printed and given to Dr. [name] during visit.

## 2022-10-15 ENCOUNTER — Ambulatory Visit
Admission: RE | Admit: 2022-10-15 | Discharge: 2022-10-15 | Disposition: A | Discharge status: Home / Self Care | Payer: Medicare PPO | Source: Ambulatory Visit | Attending: Internal Medicine | Admitting: Internal Medicine

## 2022-10-15 ENCOUNTER — Other Ambulatory Visit: Payer: Self-pay | Admitting: Internal Medicine

## 2022-10-15 DIAGNOSIS — E349 Endocrine disorder, unspecified: Secondary | ICD-10-CM | POA: Diagnosis not present

## 2022-10-15 DIAGNOSIS — E2839 Other primary ovarian failure: Secondary | ICD-10-CM

## 2022-10-15 DIAGNOSIS — M810 Age-related osteoporosis without current pathological fracture: Secondary | ICD-10-CM | POA: Diagnosis not present

## 2022-10-15 DIAGNOSIS — G8254 Quadriplegia, C5-C7 incomplete: Secondary | ICD-10-CM

## 2022-10-17 ENCOUNTER — Encounter: Payer: Self-pay | Admitting: Internal Medicine

## 2022-10-19 ENCOUNTER — Encounter (HOSPITAL_BASED_OUTPATIENT_CLINIC_OR_DEPARTMENT_OTHER): Payer: Medicare PPO | Attending: Internal Medicine | Admitting: Internal Medicine

## 2022-10-19 DIAGNOSIS — L89323 Pressure ulcer of left buttock, stage 3: Secondary | ICD-10-CM | POA: Diagnosis not present

## 2022-10-19 DIAGNOSIS — G8250 Quadriplegia, unspecified: Secondary | ICD-10-CM | POA: Diagnosis not present

## 2022-10-20 ENCOUNTER — Ambulatory Visit: Payer: Medicare PPO | Admitting: Occupational Therapy

## 2022-10-20 ENCOUNTER — Ambulatory Visit: Payer: Medicare PPO | Attending: Physical Medicine and Rehabilitation | Admitting: Physical Therapy

## 2022-10-20 DIAGNOSIS — G8254 Quadriplegia, C5-C7 incomplete: Secondary | ICD-10-CM | POA: Diagnosis not present

## 2022-10-20 DIAGNOSIS — R29818 Other symptoms and signs involving the nervous system: Secondary | ICD-10-CM | POA: Insufficient documentation

## 2022-10-20 DIAGNOSIS — G8253 Quadriplegia, C5-C7 complete: Secondary | ICD-10-CM | POA: Diagnosis not present

## 2022-10-20 DIAGNOSIS — R29898 Other symptoms and signs involving the musculoskeletal system: Secondary | ICD-10-CM

## 2022-10-20 DIAGNOSIS — R278 Other lack of coordination: Secondary | ICD-10-CM | POA: Insufficient documentation

## 2022-10-20 DIAGNOSIS — M24542 Contracture, left hand: Secondary | ICD-10-CM | POA: Insufficient documentation

## 2022-10-20 DIAGNOSIS — R208 Other disturbances of skin sensation: Secondary | ICD-10-CM | POA: Insufficient documentation

## 2022-10-20 DIAGNOSIS — R293 Abnormal posture: Secondary | ICD-10-CM | POA: Diagnosis not present

## 2022-10-20 DIAGNOSIS — M6281 Muscle weakness (generalized): Secondary | ICD-10-CM

## 2022-10-20 DIAGNOSIS — M24541 Contracture, right hand: Secondary | ICD-10-CM | POA: Insufficient documentation

## 2022-10-20 NOTE — Therapy (Signed)
OUTPATIENT PHYSICAL THERAPY NEURO TREATMENT   Patient Name: [name] MRN: [id number] DOB:10/18/1950, 72 y.o., female Today's Date: 10/20/2022   PCP: [name], MD REFERRING PROVIDER: [name], MD  END OF SESSION:  PT End of Session - 10/20/22 1453     Visit Number 2    Number of Visits 17   16 + eval   Date for PT Re-Evaluation 12/10/22    Authorization Type [employer] MEDICARE    Progress Note Due on Visit 10    PT Start Time 1445    PT Stop Time 1526    PT Time Calculation (min) 41 min    Activity Tolerance Patient tolerated treatment well    Behavior During Therapy [facility] for tasks assessed/performed              Past Medical History:  Diagnosis Date   CERVICAL POLYP 03/11/2008   Qualifier: Diagnosis of  By: [name] MD, [name]    Colon polyps 2005   on colonscopy [name]   Fibroid 2004   Per Dr. [name]   History of shingles    face and mouth   Hx of skin cancer, basal cell    Rosacea    Sciatica of left side 09/28/2013   Scoliosis    noted on mri done for back pain   Past Surgical History:  Procedure Laterality Date   BUNIONECTOMY     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   Nerve pain 05/03/2022   Medication monitoring encounter 01/08/2022   Neurogenic bladder 10/11/2021   Urinary incontinence 10/11/2021   ESBL (extended spectrum beta-lactamase) producing bacteria infection 10/09/2021   Recurrent UTI 10/09/2021   Quadriplegia, C5-C7 incomplete (HCC) 01/16/2021   History of spinal fracture 01/16/2021   Suprapubic catheter (HCC) 01/16/2021   Encounter for routine gynecological examination 09/28/2013   Onychomycosis 09/28/2013   Foot deformity, acquired 03/26/2012   Encounter for preventive health examination 12/25/2010   ROSACEA 08/25/2009   Disturbance in sleep behavior 03/11/2008   SKIN  CANCER, HX OF 03/11/2008   DYSURIA, HX OF 03/11/2008   Hyperlipidemia 02/10/2007   CERVICALGIA 02/10/2007    ONSET DATE: 09/28/2022 (most recent referral)  REFERRING DIAG: G82.54 (ICD-10-CM) - Quadriplegia, C5-C7, incomplete (HCC)  THERAPY DIAG:  Muscle weakness (generalized)  Other symptoms and signs involving the musculoskeletal system  Quadriplegia, C5-C7 complete (HCC)  Abnormal posture  Rationale for Evaluation and Treatment: Rehabilitation  SUBJECTIVE:  SUBJECTIVE STATEMENT: Pt received supine on mat table in therapy gym from OT session. Pt reports no acute changes since initial evaluation. Pt and her caregiver recently moved back to [place] from [place], her stander and her recumbent bike are still in [place] so she does not currently have access this equipment. Pt endorses orthostatic hypotension but has not been utilizing BLE ACE wraps or abdominal binder for standing, just monitors BP and symptoms.  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 Yes: NPRS scale: 3/10 Pain location: forearms to fingertips Pain description: constant, pinprick/tingling Aggravating factors: nighttime Relieving factors: nothing, sometimes medicines  PRECAUTIONS: Fall; suprapubic catheter (she wants to get this removed meaning she needs to get to and from the toilet); she has had minor heat sensation when needing to complete her bowel program-possible AD?  WEIGHT BEARING RESTRICTIONS: No-pt was in stander at most recent therapy in [place] last week.  She will have a bone density done soon w/ Dr. [name].  FALLS: Has patient fallen in last 6 months? No  LIVING ENVIRONMENT: Lives  with: lives with their spouse and live-in nurse [name] Lives in: House/apartment-townhouse Stairs: No-level entry, but multi-level home 4 stories w/ elevator Has following equipment at home: Wheelchair (power), Wheelchair (manual), Grab bars, and standing frame, sliding board, transport shower chair, handheld shower head, leg strap-pt reports she no longer finds this helpful  PLOF: Requires assistive device for independence, Needs assistance with ADLs, Needs assistance with homemaking, and Needs assistance with transfers  OCCUPATION:  Writer-uses Dragon to dictate  PATIENT GOALS: "Make my right leg work."  Stand and pivot so she can more easily access a toilet.  OBJECTIVE:   DIAGNOSTIC FINDINGS: No recent relevant imaging.  Bone density scheduled 10/15/2022.  COGNITION: Overall cognitive status: Within functional limits for tasks assessed   SENSATION: Light touch: Diminished ability to distinguish sharp and dull, but able to distinguish light touch from injury level down accurately  COORDINATION: Not formally assessed.  EDEMA:  Well managed w/ lymphatic massage and compression stockings.  MUSCLE TONE: Pt has intermittent clonus during transfers.  POSTURE: rounded shoulders, posterior pelvic tilt, and right thoracic scoliosis   LOWER EXTREMITY ROM:     Passive  Right 10/20/22 Left 10/20/22  Hip flexion [facility]  Hip extension    Hip abduction    Hip adduction    Hip internal rotation [facility]  Hip external rotation [facility]  Knee flexion [facility] WFL  Knee extension [facility] WFL  Ankle dorsiflexion Slight PF contracture Slight PF contracture  Ankle plantarflexion    Ankle inversion    Ankle eversion     (Blank rows = not tested)    LOWER EXTREMITY MMT:    MMT Right 10/20/22 Left 10/20/22  Hip flexion 1 2+  Hip extension    Hip abduction    Hip adduction    Hip internal rotation    Hip external rotation    Knee flexion 2- 3  Knee extension 2- 3  Ankle dorsiflexion 0 3  Ankle  plantarflexion    Ankle inversion    Ankle eversion     (Blank rows = not tested)   BED MOBILITY:  Sit to supine Mod A Supine to sit Mod A Rolling to Right Mod A Rolling to Left Mod A Undulating mattress for wound management on standard bed (elevated-so often doing uphill  sliding board transfers); she would like to continue working on sitting up independently, she has been working on rolling, needs less assistance w/ this when someone props her leg into hooklying; would like something to help her pull her left leg to her butt for stretching as well as bed mobility.  TRANSFERS: Pt performs lateral scoot over to right then left wc <> mat minA for foot positioning only, pt is on bladder training so catheter clamped off during transfer but with therapist to maintain proximity.  Pt able to intermittently PF/push w/ LLE when positioned in mechanically advantageous position by therapist.  FUNCTIONAL TESTS:  None relevant to pt's current functional level and abilities.  PATIENT SURVEYS:  None completed due to time.  TODAY'S TREATMENT:       TherAct Assessed FIST: 34/56, relies on UE support for dynamic sitting balance  Assessed supine and seated RLE and LLE PROM and MMT, see chart above.  Verbally reviewed patient's goals of being able to perform a stand pivot transfer, toilet transfer, and work on increasing her RLE strength.  Scooting to EOM in hooklying position with assist for LE management and assist scooting hips. Supine to sit with max A for BLE management and trunk elevation. Pt able to maintain sitting balance EOM with SBA with intermittent UE support.  Slide board transfer mat table to PWC with CGA x 1, setup A for board placement and setting up LE for transfer.                                                                                                                PATIENT EDUCATION: Education details: PT POC and goals to be worked on during therapy sessions Person  educated: Patient and Arts administrator Education method: Explanation Education comprehension: verbalized understanding  HOME EXERCISE PROGRAM: Will be established as needed as pt has done continuous therapy and is working towards functional tasks.  GOALS: Goals reviewed with patient? Yes  SHORT TERM GOALS: Target date: 11/12/2022  HEP to be established for stretching and strengthening as needed. Baseline:  To be established. Goal status: INITIAL  2.  Pt will be able to perform rolling L and R w/ no more than minA in order to improve functional mobility. Baseline: modA Goal status: INITIAL  3.  Pt will perform sit<>supine requiring no more than minA in order to improve functional mobility. Baseline: modA Goal status: INITIAL  4.  Pt will perform bilateral reach outside seated BOS with feet supported without LOB in order to improve safety with ADL performance. Baseline: lateral LOB bilaterally demonstrated in w/c Goal status: INITIAL  LONG TERM GOALS: Target date: 12/10/2022  Pt will perform squat pivot transfer w/ no more than modA in order to improve access to home environment for toilet transfers. Baseline: minA for lateral scoot transfer, pt able to clear bottom intermittently Goal status: INITIAL  2.  Pt will perform sit<>supine requiring no more than supervision in order to improve transfers in and out of bed. Baseline:  modA Goal status: INITIAL  3.  Pt will report wound healing in order to return to day program at [facility]. Baseline:  Ischial wound Goal status: INITIAL  4.  Pt will be able to perform rolling L and R w/ no more than supervision in order to improve functional mobility. Baseline: modA Goal status: INITIAL  5.  Pt will perform uphill sliding board transfer w/ no more than modA in order to improve transfer in home environment. Baseline:  To be assessed. Goal status: INITIAL  6.  Pt will perform partial STS in stander or from elevated power w/c  to counter w/ no more than modA in order to improve capacity for higher level transfers and access to uneven surfaces. Baseline: To be assessed. Goal status: INITIAL  ASSESSMENT:  CLINICAL IMPRESSION: Emphasis of skilled PT session on assessing FIST to get baseline sitting balance for this POC, assessing BLE PROM and MMT in supine and seated positions, discussing pt goals for therapy, and performing slide board transfer mat table to PWC. Pt exhibits LLE strength > RLE strength, but improved RLE strength as compared to previous PT POC at this clinic. Discussed techniques for management of OH with standing, will initiate standing with standing frame next session. Pt continues to benefit from skilled therapy services to work towards her LTGs. Continue POC.   OBJECTIVE IMPAIRMENTS: decreased balance, decreased coordination, decreased mobility, difficulty walking, decreased ROM, decreased strength, hypomobility, increased edema, impaired flexibility, impaired sensation, impaired tone, impaired UE functional use, improper body mechanics, postural dysfunction, and pain.   ACTIVITY LIMITATIONS: carrying, lifting, bending, sitting, standing, squatting, stairs, transfers, bed mobility, continence, bathing, toileting, dressing, reach over head, hygiene/grooming, locomotion level, and caring for others  PARTICIPATION LIMITATIONS: meal prep, cleaning, laundry, driving, and community activity  PERSONAL FACTORS: Age, Fitness, Past/current experiences, Time since onset of injury/illness/exacerbation, and 1-2 comorbidities: intermittent AD, neurogenic bowel/bladder on active bladder training  are also affecting patient's functional outcome.   REHAB POTENTIAL: Good  CLINICAL DECISION MAKING: Evolving/moderate complexity  EVALUATION COMPLEXITY: Moderate  PLAN:  PT FREQUENCY: 2x/week  PT DURATION: 8 weeks  PLANNED INTERVENTIONS: Therapeutic exercises, Therapeutic activity, Neuromuscular re-education,  Balance training, Patient/Family education, Self Care, DME instructions, Electrical stimulation, Wheelchair mobility training, Manual therapy, and Re-evaluation  PLAN FOR NEXT SESSION:  Further assess and address multilevel slide board transfers.  Education on pressure relief for wound healing.  Core strength, static sitting balance-perturbations.  Bed mobility.  Use stander to practice weight-bearing and core as tolerated--monitor BP due to history of OH.     Peter Congo, PT, DPT, CSRS 10/20/2022, 3:27 PM

## 2022-10-20 NOTE — Therapy (Signed)
OUTPATIENT OCCUPATIONAL THERAPY NEURO EVALUATION  Patient Name: [name] MRN: [id number] DOB:[birth year], 72 y.o., female Today's Date: 10/20/2022  PCP: [name], MD  REFERRING PROVIDER: [name], MD  END OF SESSION:  OT End of Session - 10/20/22 1400     Visit Number 2    Number of Visits 17    Date for OT Re-Evaluation 12/10/22    Authorization Type [employer] - requires auth, [place]    Progress Note Due on Visit 10    OT Start Time 1401    OT Stop Time 1445    OT Time Calculation (min) 44 min    Equipment Utilized During Treatment Assessment materials, Patient in Power [facility]    Activity Tolerance Patient tolerated treatment well    Behavior During Therapy [facility] for tasks assessed/performed             Past Medical History:  Diagnosis Date   CERVICAL POLYP 03/11/2008   Qualifier: Diagnosis of  By: [name] MD, [name]    Colon polyps 2005   on colonscopy [name]   Fibroid 2004   Per Dr. [name]   History of shingles    face and mouth   Hx of skin cancer, basal cell    Rosacea    Sciatica of left side 09/28/2013   Scoliosis    noted on mri done for back pain   Past Surgical History:  Procedure Laterality Date   BUNIONECTOMY     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   Nerve pain 05/03/2022   Medication monitoring encounter 01/08/2022   Neurogenic bladder 10/11/2021   Urinary incontinence 10/11/2021   ESBL (extended spectrum beta-lactamase) producing bacteria infection 10/09/2021   Recurrent UTI 10/09/2021   Quadriplegia, C5-C7 incomplete (HCC) 01/16/2021   History of spinal fracture 01/16/2021   Suprapubic catheter (HCC) 01/16/2021   Encounter for routine gynecological examination 09/28/2013   Onychomycosis 09/28/2013   Foot deformity, acquired 03/26/2012   Encounter for preventive health  examination 12/25/2010   ROSACEA 08/25/2009   Disturbance in sleep behavior 03/11/2008   SKIN CANCER, HX OF 03/11/2008   DYSURIA, HX OF 03/11/2008   Hyperlipidemia 02/10/2007   CERVICALGIA 02/10/2007    ONSET DATE: 07/28/2020  Date of Referral 09/28/22  REFERRING DIAG: G82.54 (ICD-10-CM) - Quadriplegia, C5-C7, incomplete (HCC)  THERAPY DIAG:  Other symptoms and signs involving the musculoskeletal system  Quadriplegia, C5-C7 complete (HCC)  Abnormal posture  Rationale for Evaluation and Treatment: Rehabilitation  SUBJECTIVE:   SUBJECTIVE STATEMENT: Patient and caregiver [name]) sought advice on positioning due to wound on her bottom and recommendation from wound doctor today to stay completely off the area. Pt accompanied by:  Live in Caregiver - [name]   PERTINENT HISTORY: "Pt is a 72 yr old L handed female with hx of incomplete quadriplegia- 2/14 2022- fleeing the police in [place] on passenger 100 (high speed) miles/hour,  [name]; neurogenic bowel and bladder and spasticity; [name], [name] Here for f/u on Incomplete quadriplegia"  Referral from MD 09/28/22 states, "Please eval and treat for ADLs and higher level mobility."  PRECAUTIONS: Fall; suprapubic catheter (she wants to get this removed meaning she needs to get to and from the toilet); she has had minor heat sensation when needing to complete her bowel program-possible AD?   WEIGHT BEARING RESTRICTIONS: No-pt was  in stander at most recent therapy in [place] last week.  She will have a bone density done soon w/ Dr. [name].   PAIN:  Patient reports chronic pain 3/10 fingers to elbow bilaterally managed by medication and will inform therapy staff of any changes.  FALLS: Has patient fallen in last 6 months? No  LIVING ENVIRONMENT: Lives with: lives with their family - husband [name] and with an adult companion s/p moving back up from [place] x10 months Lives in: House/apartment Stairs:  4 story town  house with an Engineer, structural with threshold adjustments, roll in shower with transport chair Has following equipment at home: Wheelchair (power) - with seat height adjustments to access counters and reclining option, Wheelchair (manual), transport WC, shower chair, and Ramped entry, handheld showerhead with rails around toilet, had Michiel Sites but is no longer in need of it, has slide boards x3  PLOF: Requires assistive device for independence, Needs assistance with ADLs, Needs assistance with homemaking, Needs assistance with gait, and Needs assistance with transfers; 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  Used to like to knit, sew and bake.  PATIENT GOALS: Wants to be able to type - currently using advanced Dragon dictation at times but prefers to type at times.  She would like to be able to write better and is interested in resuming some leisure activities such as Archivist and baking.  She also wants to keep working on being able to cut her own food and on her UE strength.   OBJECTIVE:   HAND DOMINANCE: Left  ADLs: Overall ADLs: Patient has a live in caregiver  Transfers/ambulation related to ADLs: Mod assist with sliding board transfers (previously [employer] lift).  Eating: Has a rocker knife that she can use. Used to use adapted utensils but now uses regular utensils but still will get assistance to cut food ie) when eating out.  Grooming: can brush her own hair but unable to manage jewelry ie) earrings  UB Dressing: can zip/unzip after it has been started, unable to manage buttons herself, Caregiver assists but if she has extra time, she can put on her bra, and a loose fitting pullover shirt/t-shirt  LB Dressing: dependent for LB dressing in bed and with special sock donner for LE compression garments   Toileting: bladder trained with suprapubic catheter which she clamps off.  Dependent for bowel incontinence care.  Bathing: Sponge bath with adult washclothes.  Can bath UB with back  scrubber for most of her back.  Needs help with feet (mentioned she might need a separate brush for feet)   Tub Shower transfers: Min-mod assist with slide board  Equipment: Shower seat with back, Walk in shower, bed side commode, Reacher, Sock aid, Long handled sponge, and Feeding equipment  IADLs: --  Shopping: Assisted by caregiver  Light housekeeping: Has housekeeper that comes monthly  Meal Prep: previously enjoyed baking. Assisted by caregiver but described recent success at reheating a meal for herself after getting food out of the fridge/freezer from her WC.  Community mobility: Dependent  Medication management: Caregiver sorts them into pillbox but she is very aware of her medications   Financial management: Patient manages her own finances  Handwriting: Increased time and has a pen with a little grip  MOBILITY STATUS: Independent with power mobiity  POSTURE COMMENTS:  No Significant postural limitations and forward head Sitting balance: Supports self independently with both Ues  ACTIVITY TOLERANCE: Activity tolerance: Fair - MMT WFL but has limited sustained tolerance for  ongoing use of UEs  FUNCTIONAL OUTCOME MEASURES: 10/20/22   UPPER EXTREMITY ROM:   AROM - WFL without obvious contractures, some digital flexion noted but PROM WNL   AROM Right (eval) Left (eval)  Shoulder flexion [facility]  Shoulder abduction [facility]  Elbow flexion [facility] WFL  Elbow extension [facility]  Wrist flexion [facility] WFL  Wrist extension WFL WFL  Ulnar deviation WFL Decreased ulnar  deviation past midline  Wrist pronation [facility] WFL  Wrist supination [facility]  Digit Composite Flexion Lacks full AROM:   1st digit - 5 cm   3rd digit -1 cm   4th digit - 2 cm  Lacks full AROM:   1st digit - 1.5 cm  5th digit - 3 cm   Digit Composite Extension [facility]  Digit Opposition Opposition to index finger only Lacks to pinkie due to limited DIP pinkie flexion  (Blank rows = not tested)  UPPER  EXTREMITY MMT:   Grossly WFL - Endurance limited R tricep strength > than L but L UE generally stronger than R UE  MMT Right (eval) Left (eval)  Shoulder flexion 4/5 4/5  Shoulder abduction 4/5 4/5  Elbow flexion 4/5 4/5  Elbow extension 4/5 4-/5  (Blank rows = not tested)  HAND FUNCTION: Grip strength: Right: 4.8 lbs; Left: 20.9 lbs  COORDINATION: Finger Nose Finger test: R generally WFL, L WNL Box and Blocks:  Right 28 blocks, Left 37 blocks R hand finger eventually cramps and dexterity worsens in the cold  SENSATION: Light touch: Impaired  - patient   EDEMA: NA for UEs but LE has poor lymph drainage with custom compression garments   MUSCLE TONE: Generally WFL but will assess further  COGNITION: Overall cognitive status: Within functional limits for tasks assessed  VISION: Subjective report: Patent wears progressive lens/glasses.  Denies diplopia or vision changes but has eye exam in the next couple of months. Baseline vision: Wears glasses all the time  VISION ASSESSMENT: WFL  OBSERVATIONS: Patient independent with power WC navigation within clinic.  Patient is well-kept with foley catheter in place.  She has slight limitations in full extension of digits but PROM is WNL and she has splints at home that she said she will bring for OT staff to assess.  She has functional ROM of B UEs to reach her head, behind her back and to cross midline to assist with ADLs.  She had fairly    TODAY'S TREATMENT:                                                                                                                                 Self care -- Extensive education, training and practice provided for weight shifting, WC pushups and bed positioning to work on pressure relief off L buttock s/p visit to wound doctor today and instruction to stay off the wound as much as possible.  Patient is able to demonstrate leaning  to the side to weight shift in her wheelchair and is able to  demonstrate use of various electronic tilt and recline options to adjust her position in the chair.  Patient is engaging wheelchair push-ups and will benefit from continued work on this to provide increased frequency with pressure relief.  Patient was assisted with ADL transfer from bed to wheelchair to mat table to simulate bed positioning.  Patient's caregiver is present and able to consult regarding schedule for alternate positioning.  Patient is assisted with side-lying and partial side-lying using a pillow under her top leg and back/shoulder.  Therapist is able to palpate the dressing on her upper left buttock and notes good pressure relief during wheelchair push-ups, improving pelvic posture and with appropriate positioning of pillows in the bed.  Options are explored to allow patient to roll back and pull pillows out.  Recommendation is to have a pillowcase opening that she can pull on i.e. from under her legs.   PATIENT EDUCATION: Education details: Bed positioning and pressure relief Person educated: Patient and Caregiver Live in caregiver - [name] Education method: Explanation, Demonstration, Tactile cues, and Verbal cues Education comprehension: verbalized understanding and needs further education  HOME EXERCISE PROGRAM: None issued at evaluation - will explore needs as sessions progress   GOALS:   SHORT TERM GOALS: Target date: 11/09/22  Patient will be able to use AE/modified techniques to cut soft foods small enough for oral intake. Baseline: Caregiver/spouse assist Goal status: INITIAL  2.  Patient will be able to use AE/modified positioning to complete word search by drawing lines through words with 0 errors. Baseline: TBD Goal status: INITIAL  3.  Patient will verbalize understanding of good pressure relief schedule (for 15 to 60 seconds every 15 to 60 minutes) to help with wound healing. Baseline: Patient did not change positioning in > 45 minutes of OT eval. Goal status:  IN PROGRESS  4.  Patient will be assisted to explore modifications for leisure tasks (knit/sew/bake) with good return demonstration. Baseline: Minimal involvement Goal status: INITIAL  5.  Patient will demonstrate independence with HEP for UE strengthening, coordination and ROM to prevent contractures and maintain strength for transfers and ADLs. Baseline: Previous HEPs have been established but need to be reviewed and updated.  Goal status: INITIAL  6.  Patient will be assessed for typing speed/dexterity. Baseline: Patient reports difficulty with typing with all her fingers. Goal status: INITIAL  7. Patient will complete Quick Dash UE assessment. Baseline: TBD Goal Status: 10/20/22 Discontinued - see scores above   LONG TERM GOALS: Target date: 12/10/22  Patient will complete 1 small craft/week r/t her leisure interests to work on FMS daily. Baseline: Minimal involvement Goal status: INITIAL  2.  Patient will improve B coordination for increased typing speed/dexterity x 1-2 WPM. Baseline: TBD Goal status: INITIAL  3.  Patient will improve B UE coordination, strength and functional use to bake cookies with setup assistance and AE/modified techniques as appropriate.  Baseline: Not performed. Goal status: INITIAL  4.  Patient will report no more than moderate difficulty using a knife to foods such as chicken, small enough for oral intake using AE and strategies as needed. Baseline: Caregiver/spouse assist Goal status: INITIAL  5.  Patient will be able to use AE/modified positioning to complete 4 sentences with 100% legibility. Baseline: Subjective reports of difficulty with writing Goal status: INITIAL   ASSESSMENT:  CLINICAL IMPRESSION: Patient seen today for occupational therapy today to address pressure relief and positioning due  to issue with wound on her L buttock.   Pt will benefit from skilled OT services in the outpatient setting to work on impairments as noted below  to help pt return to highest level of independence with self care, work and leisure activities.     PERFORMANCE DEFICITS: in functional skills including ADLs, IADLs, coordination, dexterity, strength, muscle spasms, Fine motor control, Gross motor control, continence, skin integrity, and UE functional use,   IMPAIRMENTS: are limiting patient from ADLs, IADLs, work, and leisure.   CO-MORBIDITIES: has co-morbidities such as incontinence and wound  that affects occupational performance. Patient will benefit from skilled OT to address above impairments and improve overall function.  REHAB POTENTIAL: Fair due to chronicity of injury   PLAN:  OT FREQUENCY: 2x/week  OT DURATION: 8 weeks  PLANNED INTERVENTIONS: self care/ADL training, therapeutic exercise, therapeutic activity, neuromuscular re-education, manual therapy, passive range of motion, balance training, functional mobility training, splinting, patient/family education, energy conservation, coping strategies training, and DME and/or AE instructions  RECOMMENDED OTHER SERVICES: Patient was seen for PT evaluation today with treatment plans coordinated for 2x/week.  CONSULTED AND AGREED WITH PLAN OF CARE: Patient and family member/caregiver  PLAN FOR NEXT SESSION:  Review bed positioning success. Will review splints as patient.  Initiate ROM HEP.  Explore FM tasks and establish weight shifting instruction for pressure relief.    Victorino Sparrow, OT 10/20/2022, 5:27 PM

## 2022-10-21 ENCOUNTER — Ambulatory Visit: Payer: Medicare PPO | Admitting: Occupational Therapy

## 2022-10-21 ENCOUNTER — Ambulatory Visit: Payer: Medicare PPO | Admitting: Physical Therapy

## 2022-10-21 ENCOUNTER — Ambulatory Visit (HOSPITAL_BASED_OUTPATIENT_CLINIC_OR_DEPARTMENT_OTHER): Payer: Medicare PPO | Admitting: General Surgery

## 2022-10-21 DIAGNOSIS — R293 Abnormal posture: Secondary | ICD-10-CM

## 2022-10-21 DIAGNOSIS — G8254 Quadriplegia, C5-C7 incomplete: Secondary | ICD-10-CM | POA: Diagnosis not present

## 2022-10-21 DIAGNOSIS — R278 Other lack of coordination: Secondary | ICD-10-CM

## 2022-10-21 DIAGNOSIS — M6281 Muscle weakness (generalized): Secondary | ICD-10-CM

## 2022-10-21 DIAGNOSIS — R29818 Other symptoms and signs involving the nervous system: Secondary | ICD-10-CM | POA: Diagnosis not present

## 2022-10-21 DIAGNOSIS — R29898 Other symptoms and signs involving the musculoskeletal system: Secondary | ICD-10-CM

## 2022-10-21 DIAGNOSIS — M24542 Contracture, left hand: Secondary | ICD-10-CM | POA: Diagnosis not present

## 2022-10-21 DIAGNOSIS — G8253 Quadriplegia, C5-C7 complete: Secondary | ICD-10-CM | POA: Diagnosis not present

## 2022-10-21 DIAGNOSIS — M24541 Contracture, right hand: Secondary | ICD-10-CM | POA: Diagnosis not present

## 2022-10-21 NOTE — Therapy (Addendum)
OUTPATIENT OCCUPATIONAL THERAPY NEURO TREATMENT  Patient Name: [name] MRN: [id number] DOB:[birth year], 72 y.o., female Today's Date: 10/21/2022  PCP: Carmen Headings, MD  REFERRING PROVIDER: [name], MD  END OF SESSION:  OT End of Session - 10/21/22 0840     Visit Number 3 (P)     Number of Visits 17 (P)     Date for OT Re-Evaluation 12/10/22 (P)     Authorization Type [employer] - requires auth, [place] (P)     Progress Note Due on Visit 10 (P)     OT Start Time 0844 (P)     OT Stop Time 0930 (P)     OT Time Calculation (min) 46 min (P)     Equipment Utilized During Treatment Assessment materials, Patient in Power WC (P)     Activity Tolerance Patient tolerated treatment well (P)     Behavior During Therapy WFL for tasks assessed/performed (P)              Past Medical History:  Diagnosis Date   CERVICAL POLYP 03/11/2008   Qualifier: Diagnosis of  By: Carmen Sharp MD, [name]    Colon polyps 2005   on colonscopy Dr. Russella [name]   Fibroid 2004   Per Dr. Dareen [name]   History of shingles    face and mouth   Hx of skin cancer, basal cell    Rosacea    Sciatica of left side 09/28/2013   Scoliosis    noted on mri done for back pain   Past Surgical History:  Procedure Laterality Date   BUNIONECTOMY     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   Nerve pain 05/03/2022   Medication monitoring encounter 01/08/2022   Neurogenic bladder 10/11/2021   Urinary incontinence 10/11/2021   ESBL (extended spectrum beta-lactamase) producing bacteria infection 10/09/2021   Recurrent UTI 10/09/2021   Quadriplegia, C5-C7 incomplete (HCC) 01/16/2021   History of spinal fracture 01/16/2021   Suprapubic catheter (HCC) 01/16/2021   Encounter for routine gynecological examination 09/28/2013   Onychomycosis 09/28/2013   Foot deformity, acquired  03/26/2012   Encounter for preventive health examination 12/25/2010   ROSACEA 08/25/2009   Disturbance in sleep behavior 03/11/2008   SKIN CANCER, HX OF 03/11/2008   DYSURIA, HX OF 03/11/2008   Hyperlipidemia 02/10/2007   CERVICALGIA 02/10/2007    ONSET DATE: 07/28/2020  Date of Referral 09/28/22  REFERRING DIAG: G82.54 (ICD-10-CM) - Quadriplegia, C5-C7, incomplete (HCC)  THERAPY DIAG:  Other lack of coordination  Muscle weakness (generalized)  Abnormal posture  Quadriplegia, C5-C7 incomplete (HCC)  Other symptoms and signs involving the musculoskeletal system  Rationale for Evaluation and Treatment: Rehabilitation  SUBJECTIVE:   SUBJECTIVE STATEMENT: Patient and caregiver present today and remembered her splints.  Reports wearing some type of splint for 2+ years and currently wears these resting hand splints at night. Pt accompanied by:  Live in Caregiver - [name]   PERTINENT HISTORY: "Pt is a 73 yr old L handed female with hx of incomplete quadriplegia- 2/14 2022- fleeing the police in [place] on passenger 100 (high speed) miles/hour,  [name]; neurogenic bowel and bladder and spasticity; [name], [name] Here for f/u on Incomplete quadriplegia"  Referral from MD 09/28/22 states, "Please eval and treat for ADLs and higher level mobility."  PRECAUTIONS: Fall; suprapubic catheter (she wants to get  this removed meaning she needs to get to and from the toilet); she has had minor heat sensation when needing to complete her bowel program-possible AD?   WEIGHT BEARING RESTRICTIONS: No-pt was in stander at most recent therapy in [place] last week.  She will have a bone density done soon w/ Dr. Fabian [name].   PAIN:  Patient reports chronic pain 3/10 fingers to elbow bilaterally managed by medication and will inform therapy staff of any changes.  FALLS: Has patient fallen in last 6 months? No  LIVING ENVIRONMENT: Lives with: lives with their family - husband [name]  and with an adult companion s/p moving back up from [place] x10 months Lives in: House/apartment Stairs:  4 story town house with an Engineer, structural with threshold adjustments, roll in shower with transport chair Has following equipment at home: Wheelchair (power) - with seat height adjustments to access counters and reclining option, Wheelchair (manual), transport WC, shower chair, and Ramped entry, handheld showerhead with rails around toilet, had [name] but is no longer in need of it, has slide boards x3  PLOF: Requires assistive device for independence, Needs assistance with ADLs, Needs assistance with homemaking, Needs assistance with gait, and Needs assistance with transfers; 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  Used to like to knit, sew and bake.  PATIENT GOALS: Wants to be able to type - currently using advanced Dragon dictation at times but prefers to type at times.  She would like to be able to write better and is interested in resuming some leisure activities such as Archivist and baking.  She also wants to keep working on being able to cut her own food and on her UE strength.   OBJECTIVE:   HAND DOMINANCE: Left  ADLs: Overall ADLs: Patient has a live in caregiver  Transfers/ambulation related to ADLs: Mod assist with sliding board transfers (previously [employer] lift).  Eating: Has a rocker knife that she can use. Used to use adapted utensils but now uses regular utensils but still will get assistance to cut food ie) when eating out.  Grooming: can brush her own hair but unable to manage jewelry ie) earrings  UB Dressing: can zip/unzip after it has been started, unable to manage buttons herself, Caregiver assists but if she has extra time, she can put on her bra, and a loose fitting pullover shirt/t-shirt  LB Dressing: dependent for LB dressing in bed and with special sock donner for LE compression garments   Toileting: bladder trained with suprapubic catheter which she  clamps off.  Dependent for bowel incontinence care.  Bathing: Sponge bath with adult washclothes.  Can bath UB with back scrubber for most of her back.  Needs help with feet (mentioned she might need a separate brush for feet)   Tub Shower transfers: Min-mod assist with slide board  Equipment: Shower seat with back, Walk in shower, bed side commode, Reacher, Sock aid, Long handled sponge, and Feeding equipment  IADLs: --  Shopping: Assisted by caregiver  Light housekeeping: Has housekeeper that comes monthly  Meal Prep: previously enjoyed baking. Assisted by caregiver but described recent success at reheating a meal for herself after getting food out of the fridge/freezer from her WC.  Community mobility: Dependent  Medication management: Caregiver sorts them into pillbox but she is very aware of her medications   Financial management: Patient manages her own finances  Handwriting: Increased time and has a pen with a little grip  MOBILITY STATUS: Independent with power [employer]  POSTURE COMMENTS:  No Significant postural limitations and forward head Sitting balance: Supports self independently with both Ues  ACTIVITY TOLERANCE: Activity tolerance: Fair - MMT WFL but has limited sustained tolerance for ongoing use of UEs  FUNCTIONAL OUTCOME MEASURES: 10/20/22   UPPER EXTREMITY ROM:   AROM - WFL without obvious contractures, some digital flexion noted but PROM WNL   AROM Right (eval) Left (eval)  Shoulder flexion [facility]  Shoulder abduction [facility]  Elbow flexion [facility] WFL  Elbow extension [facility]  Wrist flexion [facility] WFL  Wrist extension WFL WFL  Ulnar deviation WFL Decreased ulnar  deviation past midline  Wrist pronation [facility] WFL  Wrist supination [facility]  Digit Composite Flexion Lacks full AROM:   1st digit - 5 cm   3rd digit -1 cm   4th digit - 2 cm  Lacks full AROM:   1st digit - 1.5 cm  5th digit - 3 cm   Digit Composite Extension [facility]  Digit Opposition  Opposition to index finger only Lacks to pinkie due to limited DIP pinkie flexion  (Blank rows = not tested)  UPPER EXTREMITY MMT:   Grossly WFL - Endurance limited R tricep strength > than L but L UE generally stronger than R UE  MMT Right (eval) Left (eval)  Shoulder flexion 4/5 4/5  Shoulder abduction 4/5 4/5  Elbow flexion 4/5 4/5  Elbow extension 4/5 4-/5  (Blank rows = not tested)  HAND FUNCTION: Grip strength: Right: 4.8 lbs; Left: 20.9 lbs  COORDINATION: Finger Nose Finger test: R generally WFL, L WNL Box and Blocks:  Right 28 blocks, Left 37 blocks R hand finger eventually cramps and dexterity worsens in the cold  SENSATION: Light touch: Impaired  - patient   EDEMA: NA for UEs but LE has poor lymph drainage with custom compression garments   MUSCLE TONE: Generally WFL but will assess further  COGNITION: Overall cognitive status: Within functional limits for tasks assessed  VISION: Subjective report: Patent wears progressive lens/glasses.  Denies diplopia or vision changes but has eye exam in the next couple of months. Baseline vision: Wears glasses all the time  VISION ASSESSMENT: WFL  OBSERVATIONS: Patient independent with power WC navigation within clinic.  Patient is well-kept with foley catheter in place.  She has slight limitations in full extension of digits but PROM is WNL and she has splints at home that she said she will bring for OT staff to assess.  She has functional ROM of B UEs to reach her head, behind her back and to cross midline to assist with ADLs.  She had fairly    TODAY'S TREATMENT:                                                                                                                                 Splint/Orthotic Management   Assessed previously fabricated thermoplastic resting hand splints with some assistance to improve  fit by replacing straps and demonstrating use of old strap to help elevated L digits and separate index/middle  finger.  Also assessed possible use of finger extension splint to help with L index finger extension but may benefit from ring finger splint for DIP joint to minimize interfering with long finger.    Self Care  Patient engaged in weight shifting at least 3 times during session as OTR set timer on her phone for 10-15 minutes and encouraged her to lift her bottom with good success 1-2 full pushes and encouragement to progress towards 5 WC pushups for good pressure relief off bottom and wound.  Therapeutic Exercises  Patient engaged in theraputty activities including squeezing, rolling, pinching, pinch + pulling, and pushing pegs into the putty.  Extra time needed to perform tip to tip pinch with R hand compared to L but eventually successful at pulling pink putty off to her right side.    Also worked on elbow extension with yellow resistance band held/wrapped on vertical post on the back of her WC.  Demonstrated a modified grasp with foam tubing over the band to protect webspace and some assistance provided to work on getting her trunk off the back of the [facility] for improved core strengthening with active ROM of BUE (even trying to reach 1 hand a time forward).   PATIENT EDUCATION: Education details: Bed positioning and pressure relief Person educated: Patient and Caregiver Live in caregiver - [name] Education method: Verbal cues Education comprehension: verbalized understanding and needs further education  HOME EXERCISE PROGRAM: None issued at evaluation - will explore needs as sessions progress   GOALS:   SHORT TERM GOALS: Target date: 11/09/22  Patient will be able to use AE/modified techniques to cut soft foods small enough for oral intake. Baseline: Caregiver/spouse assist Goal status: INITIAL  2.  Patient will be able to use AE/modified positioning to complete word search by drawing lines through words with 0 errors. Baseline: TBD Goal status: INITIAL  3.  Patient will verbalize  understanding of good pressure relief schedule (for 15 to 60 seconds every 15 to 60 minutes) to help with wound healing. Baseline: Patient did not change positioning in > 45 minutes of OT eval. Goal status: IN PROGRESS  4.  Patient will be assisted to explore modifications for leisure tasks (knit/sew/bake) with good return demonstration. Baseline: Minimal involvement Goal status: INITIAL  5.  Patient will demonstrate independence with HEP for UE strengthening, coordination and ROM to prevent contractures and maintain strength for transfers and ADLs. Baseline: Previous HEPs have been established but need to be reviewed and updated.  Goal status: INITIAL  6.  Patient will be assessed for typing speed/dexterity. Baseline: Patient reports difficulty with typing with all her fingers. Goal status: INITIAL  7. Patient will complete Quick Dash UE assessment. Baseline: TBD Goal Status: 10/20/22 Discontinued - see scores above   LONG TERM GOALS: Target date: 12/10/22  Patient will complete 1 small craft/week r/t her leisure interests to work on FMS daily. Baseline: Minimal involvement Goal status: INITIAL  2.  Patient will improve B coordination for increased typing speed/dexterity x 1-2 WPM. Baseline: TBD Goal status: INITIAL  3.  Patient will improve B UE coordination, strength and functional use to bake cookies with setup assistance and AE/modified techniques as appropriate.  Baseline: Not performed. Goal status: INITIAL  4.  Patient will report no more than moderate difficulty using a knife to foods such as chicken, small enough for oral intake using AE and  strategies as needed. Baseline: Caregiver/spouse assist Goal status: INITIAL  5.  Patient will be able to use AE/modified positioning to complete 4 sentences with 100% legibility. Baseline: Subjective reports of difficulty with writing Goal status: INITIAL   ASSESSMENT:  CLINICAL IMPRESSION: Patient seen today for  occupational therapy today to splinting needs, weight shifting and FM tasks.   Patient has good understanding of various activities presented to work on ROM, strength etc and asks appropriate questions to understand benefits of various activities.  She does reports hoping she can remember all the things to do and is reassured that printed info will be provided as sessions progress.  Pt will benefit from ongoing skilled OT services in the outpatient setting to work on impairments as noted below to help pt return to highest level of independence with self care, work and leisure activities.     PERFORMANCE DEFICITS: in functional skills including ADLs, IADLs, coordination, dexterity, strength, muscle spasms, Fine motor control, Gross motor control, continence, skin integrity, and UE functional use,   IMPAIRMENTS: are limiting patient from ADLs, IADLs, work, and leisure.   CO-MORBIDITIES: has co-morbidities such as incontinence and wound  that affects occupational performance. Patient will benefit from skilled OT to address above impairments and improve overall function.  REHAB POTENTIAL: Fair due to chronicity of injury   PLAN:  OT FREQUENCY: 2x/week  OT DURATION: 8 weeks  PLANNED INTERVENTIONS: self care/ADL training, therapeutic exercise, therapeutic activity, neuromuscular re-education, manual therapy, passive range of motion, balance training, functional mobility training, splinting, patient/family education, energy conservation, coping strategies training, and DME and/or AE instructions  RECOMMENDED OTHER SERVICES: Patient was seen for PT evaluation today with treatment plans coordinated for 2x/week.  CONSULTED AND AGREED WITH PLAN OF CARE: Patient and family member/caregiver  PLAN FOR NEXT SESSION:  Review bed positioning success. Provided information re: prefab soft splints/orthotist option.  Initiate written ROM/strengthening HEP including putty.  Continue FM tasks to improve typing and  emphasize weight shifting instruction for pressure relief.    Victorino Sparrow, OT 10/21/2022, 10:04 AM

## 2022-10-21 NOTE — Therapy (Signed)
OUTPATIENT PHYSICAL THERAPY NEURO TREATMENT   Patient Name: [name] MRN: [id number] DOB:1950-08-02, 72 y.o., female Today's Date: 10/21/2022   PCP: [name], MD REFERRING PROVIDER: [name], MD  END OF SESSION:  PT End of Session - 10/21/22 1531     Visit Number 3    Number of Visits 17   16 + eval   Date for PT Re-Evaluation 12/10/22    Authorization Type [employer] MEDICARE    Progress Note Due on Visit 10    PT Start Time 1530    PT Stop Time 1618    PT Time Calculation (min) 48 min    Equipment Utilized During Treatment Gait belt    Activity Tolerance Patient tolerated treatment well    Behavior During Therapy WFL for tasks assessed/performed               Past Medical History:  Diagnosis Date   CERVICAL POLYP 03/11/2008   Qualifier: Diagnosis of  By: [name] MD, [name]    Colon polyps 2005   on colonscopy [name]   Fibroid 2004   Per Dr. [name]   History of shingles    face and mouth   Hx of skin cancer, basal cell    Rosacea    Sciatica of left side 09/28/2013   Scoliosis    noted on mri done for back pain   Past Surgical History:  Procedure Laterality Date   BUNIONECTOMY     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   Nerve pain 05/03/2022   Medication monitoring encounter 01/08/2022   Neurogenic bladder 10/11/2021   Urinary incontinence 10/11/2021   ESBL (extended spectrum beta-lactamase) producing bacteria infection 10/09/2021   Recurrent UTI 10/09/2021   Quadriplegia, C5-C7 incomplete (HCC) 01/16/2021   History of spinal fracture 01/16/2021   Suprapubic catheter (HCC) 01/16/2021   Encounter for routine gynecological examination 09/28/2013   Onychomycosis 09/28/2013   Foot deformity, acquired 03/26/2012   Encounter for preventive health examination 12/25/2010   ROSACEA 08/25/2009    Disturbance in sleep behavior 03/11/2008   SKIN CANCER, HX OF 03/11/2008   DYSURIA, HX OF 03/11/2008   Hyperlipidemia 02/10/2007   CERVICALGIA 02/10/2007    ONSET DATE: 09/28/2022 (most recent referral)  REFERRING DIAG: G82.54 (ICD-10-CM) - Quadriplegia, C5-C7, incomplete (HCC)  THERAPY DIAG:  Other symptoms and signs involving the musculoskeletal system  Quadriplegia, C5-C7 complete (HCC)  Abnormal posture  Muscle weakness (generalized)  Rationale for Evaluation and Treatment: Rehabilitation  SUBJECTIVE:  SUBJECTIVE STATEMENT: Pt reports no complaints of pain other than the usual. No significant changes since seen yesterday.  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 Yes: NPRS scale: 3/10 Pain location: forearms to fingertips Pain description: constant, pinprick/tingling Aggravating factors: nighttime Relieving factors: nothing, sometimes medicines  PRECAUTIONS: Fall; suprapubic catheter (she wants to get this removed meaning she needs to get to and from the toilet); she has had minor heat sensation when needing to complete her bowel program-possible AD?  WEIGHT BEARING RESTRICTIONS: No-pt was in stander at most recent therapy in [place] last week.  She will have a bone density done soon w/ Dr. [name].  FALLS: Has patient fallen in last 6 months? No  LIVING ENVIRONMENT: Lives with: lives with their spouse and live-in nurse [name] Lives in: House/apartment-townhouse Stairs: No-level entry, but multi-level home 4 stories w/ elevator Has following equipment at home: Wheelchair (power), Wheelchair (manual), Grab bars, and standing frame, sliding board, transport  shower chair, handheld shower head, leg strap-pt reports she no longer finds this helpful  PLOF: Requires assistive device for independence, Needs assistance with ADLs, Needs assistance with homemaking, and Needs assistance with transfers  OCCUPATION:  Writer-uses Dragon to dictate  PATIENT GOALS: "Make my right leg work."  Stand and pivot so she can more easily access a toilet.  OBJECTIVE:   DIAGNOSTIC FINDINGS: No recent relevant imaging.  Bone density scheduled 10/15/2022.  COGNITION: Overall cognitive status: Within functional limits for tasks assessed   SENSATION: Light touch: Diminished ability to distinguish sharp and dull, but able to distinguish light touch from injury level down accurately  COORDINATION: Not formally assessed.  EDEMA:  Well managed w/ lymphatic massage and compression stockings.  MUSCLE TONE: Pt has intermittent clonus during transfers.  POSTURE: rounded shoulders, posterior pelvic tilt, and right thoracic scoliosis   LOWER EXTREMITY ROM:     Passive  Right 10/20/22 Left 10/20/22  Hip flexion [facility]  Hip extension    Hip abduction    Hip adduction    Hip internal rotation [facility] WFL  Hip external rotation [facility]  Knee flexion [facility] WFL  Knee extension [facility] WFL  Ankle dorsiflexion Slight PF contracture Slight PF contracture  Ankle plantarflexion    Ankle inversion    Ankle eversion     (Blank rows = not tested)    LOWER EXTREMITY MMT:    MMT Right 10/20/22 Left 10/20/22  Hip flexion 1 2+  Hip extension    Hip abduction    Hip adduction    Hip internal rotation    Hip external rotation    Knee flexion 2- 3  Knee extension 2- 3  Ankle dorsiflexion 0 3  Ankle plantarflexion    Ankle inversion    Ankle eversion     (Blank rows = not tested)   BED MOBILITY:  Sit to supine Mod A Supine to sit Mod A Rolling to Right Mod A Rolling to Left Mod A Undulating mattress for wound management on standard bed (elevated-so often doing uphill  sliding board transfers); she would like to continue working on sitting up independently, she has been working on rolling, needs less assistance w/ this when someone props her leg into hooklying; would like something to help her pull her left leg to her butt for stretching as well as bed mobility.  TRANSFERS: Pt performs lateral  scoot over to right then left wc <> mat minA for foot positioning only, pt is on bladder training so catheter clamped off during transfer but with therapist to maintain proximity.  Pt able to intermittently PF/push w/ LLE when positioned in mechanically advantageous position by therapist.  FUNCTIONAL TESTS:  None relevant to pt's current functional level and abilities.  PATIENT SURVEYS:  None completed due to time.  TODAY'S TREATMENT:       TherAct Pt received seated in PWC with her aide [name] present. [name] hands-on and assisting pt with transfers during session. Lateral scoot transfer PWC to mat table with min A needed for some repositioning of her BLE. Session focus on use of standing frame for weight-bearing through BLE for bone health, spasticity management, bowel/bladder health, stretching of BLE, and to improve upright tolerance. BP monitored in sitting and standing during time spent in standing frame, see below. Pt with good response to standing this date, able to stand x 15 min in standing frame with assist from sling. Pt does report getting "hot" at one point but no signs/symptoms of OH. Pt able to utilize her BUE pulling on tray of standing frame to achieve more upright posture with hip and trunk extension and is able to maintain this position for 2 x 1 min before needing a "rest break" by sitting back slightly in standing frame sling. Upon return to sitting on mat table pt is able to transfer back to her PWC with assist from SB needed due to fatigue, min A from aide for BLE management and placement of board. Pt left seated in PWC in care of her aide at end of  session.  Vitals during therapy session: Seated EOM: BP 131/66, HR 93 Initial standing in frame: 100/58, HR 98 Standing x 5 min: 102/50, HR 101 Standing x 10 min: 114/65, HR 98   PATIENT EDUCATION: Education details: PT POC and goals to be worked on during therapy sessions Person educated: Patient and Arts administrator Education method: Explanation Education comprehension: verbalized understanding  HOME EXERCISE PROGRAM: Will be established as needed as pt has done continuous therapy and is working towards functional tasks.  GOALS: Goals reviewed with patient? Yes  SHORT TERM GOALS: Target date: 11/12/2022  HEP to be established for stretching and strengthening as needed. Baseline:  To be established. Goal status: INITIAL  2.  Pt will be able to perform rolling L and R w/ no more than minA in order to improve functional mobility. Baseline: modA Goal status: INITIAL  3.  Pt will perform sit<>supine requiring no more than minA in order to improve functional mobility. Baseline: modA Goal status: INITIAL  4.  Pt will perform bilateral reach outside seated BOS with feet supported without LOB in order to improve safety with ADL performance. Baseline: lateral LOB bilaterally demonstrated in w/c Goal status: INITIAL  LONG TERM GOALS: Target date: 12/10/2022  Pt will perform squat pivot transfer w/ no more than modA in order to improve access to home environment for toilet transfers. Baseline: minA for lateral scoot transfer, pt able to clear bottom intermittently Goal status: INITIAL  2.  Pt will perform sit<>supine requiring no more than supervision in order to improve transfers in and out of bed. Baseline:  modA Goal status: INITIAL  3.  Pt will report wound healing in order to return to day program [facility]. Baseline:  Ischial wound Goal status: INITIAL  4.  Pt will be able to perform rolling L and R w/  no more than supervision in order to improve functional  mobility. Baseline: modA Goal status: INITIAL  5.  Pt will perform uphill sliding board transfer w/ no more than modA in order to improve transfer in home environment. Baseline:  To be assessed. Goal status: INITIAL  6.  Pt will perform partial STS in stander or from elevated power w/c to counter w/ no more than modA in order to improve capacity for higher level transfers and access to uneven surfaces. Baseline: To be assessed. Goal status: INITIAL  ASSESSMENT:  CLINICAL IMPRESSION: Emphasis of skilled PT session on working on use of standing frame for upright tolerance and weight bearing through BLE and monitoring of vitals during standing this session. Pt exhibits good tolerance for standing this date with initial drop in BP that recovers while standing with no signs/symptoms of OH. Pt also able to utilize her BUE for decreased reliance on sling for more upright posture in standing for short bouts of time. Pt is fatigued following standing x 15 min this session and does require increased assist for safe transfer back to her PWC at end of session. Pt continues to benefit from skilled therapy services to work towards LTGs. Continue POC.   OBJECTIVE IMPAIRMENTS: decreased balance, decreased coordination, decreased mobility, difficulty walking, decreased ROM, decreased strength, hypomobility, increased edema, impaired flexibility, impaired sensation, impaired tone, impaired UE functional use, improper body mechanics, postural dysfunction, and pain.   ACTIVITY LIMITATIONS: carrying, lifting, bending, sitting, standing, squatting, stairs, transfers, bed mobility, continence, bathing, toileting, dressing, reach over head, hygiene/grooming, locomotion level, and caring for others  PARTICIPATION LIMITATIONS: meal prep, cleaning, laundry, driving, and community activity  PERSONAL FACTORS: Age, Fitness, Past/current experiences, Time since onset of injury/illness/exacerbation, and 1-2 comorbidities:  intermittent AD, neurogenic bowel/bladder on active bladder training  are also affecting patient's functional outcome.   REHAB POTENTIAL: Good  CLINICAL DECISION MAKING: Evolving/moderate complexity  EVALUATION COMPLEXITY: Moderate  PLAN:  PT FREQUENCY: 2x/week  PT DURATION: 8 weeks  PLANNED INTERVENTIONS: Therapeutic exercises, Therapeutic activity, Neuromuscular re-education, Balance training, Patient/Family education, Self Care, DME instructions, Electrical stimulation, Wheelchair mobility training, Manual therapy, and Re-evaluation  PLAN FOR NEXT SESSION:  Further assess and address multilevel slide board transfers.  Education on pressure relief for wound healing.  Core strength, static sitting balance-perturbations.  Bed mobility.  Use stander to practice weight-bearing and core as tolerated--monitor BP due to history of OH, can work towards decreased sling support in standing, use of powderboard on mat table for RLE strengthening, wants to work on increasing independence with lateral scoot transfers without SB and also wants to be able to place SB independently     Peter Congo, PT, DPT, CSRS 10/21/2022, 4:20 PM

## 2022-10-25 ENCOUNTER — Encounter (HOSPITAL_BASED_OUTPATIENT_CLINIC_OR_DEPARTMENT_OTHER): Payer: Medicare PPO | Admitting: Internal Medicine

## 2022-10-25 DIAGNOSIS — T798XXA Other early complications of trauma, initial encounter: Secondary | ICD-10-CM | POA: Diagnosis not present

## 2022-10-25 DIAGNOSIS — G8250 Quadriplegia, unspecified: Secondary | ICD-10-CM

## 2022-10-25 DIAGNOSIS — L89323 Pressure ulcer of left buttock, stage 3: Secondary | ICD-10-CM | POA: Diagnosis not present

## 2022-10-27 ENCOUNTER — Encounter: Payer: Self-pay | Admitting: Physical Therapy

## 2022-10-27 ENCOUNTER — Ambulatory Visit: Payer: Medicare PPO | Admitting: Physical Therapy

## 2022-10-27 ENCOUNTER — Ambulatory Visit: Payer: Medicare PPO | Admitting: Occupational Therapy

## 2022-10-27 DIAGNOSIS — R29898 Other symptoms and signs involving the musculoskeletal system: Secondary | ICD-10-CM | POA: Diagnosis not present

## 2022-10-27 DIAGNOSIS — R29818 Other symptoms and signs involving the nervous system: Secondary | ICD-10-CM | POA: Diagnosis not present

## 2022-10-27 DIAGNOSIS — G8254 Quadriplegia, C5-C7 incomplete: Secondary | ICD-10-CM

## 2022-10-27 DIAGNOSIS — R278 Other lack of coordination: Secondary | ICD-10-CM

## 2022-10-27 DIAGNOSIS — M6281 Muscle weakness (generalized): Secondary | ICD-10-CM | POA: Diagnosis not present

## 2022-10-27 DIAGNOSIS — R293 Abnormal posture: Secondary | ICD-10-CM | POA: Diagnosis not present

## 2022-10-27 DIAGNOSIS — G8253 Quadriplegia, C5-C7 complete: Secondary | ICD-10-CM | POA: Diagnosis not present

## 2022-10-27 DIAGNOSIS — M24541 Contracture, right hand: Secondary | ICD-10-CM | POA: Diagnosis not present

## 2022-10-27 DIAGNOSIS — M24542 Contracture, left hand: Secondary | ICD-10-CM | POA: Diagnosis not present

## 2022-10-27 NOTE — Therapy (Signed)
OUTPATIENT PHYSICAL THERAPY NEURO TREATMENT   Patient Name: [name] MRN: [id number] DOB:Nov 24, 1950, 72 y.o., female Today's Date: 10/28/2022   PCP: [name], MD REFERRING PROVIDER: [name], MD  END OF SESSION:  PT End of Session - 10/27/22 1102     Visit Number 4    Number of Visits 17   16 + eval   Date for PT Re-Evaluation 12/10/22    Authorization Type [employer]ICARE    Progress Note Due on Visit 10    PT Start Time 1102   handoff from OT   PT Stop Time 1148    PT Time Calculation (min) 46 min    Equipment Utilized During Treatment Gait belt    Activity Tolerance Patient tolerated treatment well    Behavior During Therapy [facility] for tasks assessed/performed               Past Medical History:  Diagnosis Date   CERVICAL POLYP 03/11/2008   Qualifier: Diagnosis of  By: [name] MD, [name]    Colon polyps 2005   on colonscopy [name]   Fibroid 2004   Per Dr. [name]   History of shingles    face and mouth   Hx of skin cancer, basal cell    Rosacea    Sciatica of left side 09/28/2013   Scoliosis    noted on mri done for back pain   Past Surgical History:  Procedure Laterality Date   BUNIONECTOMY     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   Nerve pain 05/03/2022   Medication monitoring encounter 01/08/2022   Neurogenic bladder 10/11/2021   Urinary incontinence 10/11/2021   ESBL (extended spectrum beta-lactamase) producing bacteria infection 10/09/2021   Recurrent UTI 10/09/2021   Quadriplegia, C5-C7 incomplete (HCC) 01/16/2021   History of spinal fracture 01/16/2021   Suprapubic catheter (HCC) 01/16/2021   Encounter for routine gynecological examination 09/28/2013   Onychomycosis 09/28/2013   Foot deformity, acquired 03/26/2012   Encounter for preventive health examination 12/25/2010    ROSACEA 08/25/2009   Disturbance in sleep behavior 03/11/2008   SKIN CANCER, HX OF 03/11/2008   DYSURIA, HX OF 03/11/2008   Hyperlipidemia 02/10/2007   CERVICALGIA 02/10/2007    ONSET DATE: 09/28/2022 (most recent referral)  REFERRING DIAG: G82.54 (ICD-10-CM) - Quadriplegia, C5-C7, incomplete (HCC)  THERAPY DIAG:  Muscle weakness (generalized)  Other lack of coordination  Abnormal posture  Quadriplegia, C5-C7 incomplete (HCC)  Rationale for Evaluation and Treatment: Rehabilitation  SUBJECTIVE:  SUBJECTIVE STATEMENT: Pt reports no complaints of pain other than the usual. She denies light-headedness or BP related issues today.  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 Yes: NPRS scale: 3/10 Pain location: forearms to fingertips Pain description: constant, pinprick/tingling Aggravating factors: nighttime Relieving factors: nothing, sometimes medicines  PRECAUTIONS: Fall; suprapubic catheter (she wants to get this removed meaning she needs to get to and from the toilet); she has had minor heat sensation when needing to complete her bowel program-possible AD?  WEIGHT BEARING RESTRICTIONS: No-pt was in stander at most recent therapy in [place] last week.  She will have a bone density done soon w/ Dr. [name].  FALLS: Has patient fallen in last 6 months? No  LIVING ENVIRONMENT: Lives with: lives with their spouse and live-in nurse [name] Lives in: House/apartment-townhouse Stairs: No-level entry, but multi-level home 4 stories w/ elevator Has following equipment at home: Wheelchair (power), Wheelchair (manual), Grab bars, and standing frame, sliding board, transport  shower chair, handheld shower head, leg strap-pt reports she no longer finds this helpful  PLOF: Requires assistive device for independence, Needs assistance with ADLs, Needs assistance with homemaking, and Needs assistance with transfers  OCCUPATION:  Writer-uses Dragon to dictate  PATIENT GOALS: "Make my right leg work."  Stand and pivot so she can more easily access a toilet.  OBJECTIVE:   DIAGNOSTIC FINDINGS: No recent relevant imaging.  Bone density scheduled 10/15/2022.  COGNITION: Overall cognitive status: Within functional limits for tasks assessed   SENSATION: Light touch: Diminished ability to distinguish sharp and dull, but able to distinguish light touch from injury level down accurately  COORDINATION: Not formally assessed.  EDEMA:  Well managed w/ lymphatic massage and compression stockings.  MUSCLE TONE: Pt has intermittent clonus during transfers.  POSTURE: rounded shoulders, posterior pelvic tilt, and right thoracic scoliosis   LOWER EXTREMITY ROM:     Passive  Right 10/20/22 Left 10/20/22  Hip flexion [facility]  Hip extension    Hip abduction    Hip adduction    Hip internal rotation [facility] WFL  Hip external rotation [facility]  Knee flexion [facility] WFL  Knee extension [facility] WFL  Ankle dorsiflexion Slight PF contracture Slight PF contracture  Ankle plantarflexion    Ankle inversion    Ankle eversion     (Blank rows = not tested)    LOWER EXTREMITY MMT:    MMT Right 10/20/22 Left 10/20/22  Hip flexion 1 2+  Hip extension    Hip abduction    Hip adduction    Hip internal rotation    Hip external rotation    Knee flexion 2- 3  Knee extension 2- 3  Ankle dorsiflexion 0 3  Ankle plantarflexion    Ankle inversion    Ankle eversion     (Blank rows = not tested)   BED MOBILITY:  Sit to supine Mod A Supine to sit Mod A Rolling to Right Mod A Rolling to Left Mod A Undulating mattress for wound management on standard bed (elevated-so often doing uphill  sliding board transfers); she would like to continue working on sitting up independently, she has been working on rolling, needs less assistance w/ this when someone props her leg into hooklying; would like something to help her pull her left leg to her butt for stretching as well as bed mobility.  TRANSFERS: Pt  performs lateral scoot over to right then left wc <> mat minA for foot positioning only, pt is on bladder training so catheter clamped off during transfer but with therapist to maintain proximity.  Pt able to intermittently PF/push w/ LLE when positioned in mechanically advantageous position by therapist.  FUNCTIONAL TESTS:  None relevant to pt's current functional level and abilities.  PATIENT SURVEYS:  None completed due to time.  TODAY'S TREATMENT:       TherAct Pt received seated in PWC with her aide [name] present following handoff from OT. [name] hands-on and assisting pt with transfers during session again this visit.  Lateral scoot transfer PWC to mat table to left with min A needed for some repositioning of her BLE followed by [name] unclamping catheter to drain with pt in supported sitting on EOM prior to session.  Pt transitions to supine minA for LE management and then rolls to prone w/ minA initially for positioning of RLE to facilitate push with pt using UE and head momentum to swing over to side-lying w/ PT providing maxA to fully transition prone positioning trunk and then hips.  In prone pt performs prone press up on elbows and oscillatory push-up plus style exercise (several reps) w/ PT providing edu on importance of scapular stability as pt relies on shoulder function of upright function including transfers.  Pt progresses to full elbow extension pressups unilaterally for both UE strengthening and trunk rotation mobility w/ PT progressing from initial trunk support to SBA.  She performs several reps to fatigue each side then requires prolonged rest.  During pt active rest PT  provides bilateral quad stretch w/ IR/ER components and gentle DF stretches.  Pt rolls back to supine independently and then requires maxA to return to upright sitting EOM w/ PT first moving LE to EOM then providing trunk stability.  On EOM pt practices 4-way scooting x3 each direction w/ minA for LE placement to facilitate mostly LLE push.  Held attempts at bumping backwards onto 2" step platform after assessment of setup due to left wound being approximately same height from mat and wanting to avoid accidental shear force.  On EOM pt demonstrates unsupported sitting, forward reaching w/ alternating UE, and lateral reaching just to edge of BOS w/o LOB.  She is SBA for elbow taps to ipsilateral side with return upright (more difficulty on right, increased time no overt LOB).  PT provides challenge by having pt perform contralateral reach and elbow tap to mat for trunk stretch and compound active movement to return upright, performed x3 each side.  Pt performs bump x2 to right w/ feet on foot plates of wheelchair and then [name] places slide board and PT raises mat to mildly bias downhill as pt transfers to wheelchair at SBA level.  Once in wheelchair PT provides single TotalA adjustment at hips to promote upright and centered alignment.   PATIENT EDUCATION: Education details: Discussed pt wanting to switch to stander when fatigued/sore from recent session prior and encouraged pt to try prone pressup w/ elbow extension unilaterally as able. Person educated: Patient and Arts administrator Education method: Explanation Education comprehension: verbalized understanding  HOME EXERCISE PROGRAM: Will be established as needed as pt has done continuous therapy and is working towards functional tasks.  GOALS: Goals reviewed with patient? Yes  SHORT TERM GOALS: Target date: 11/12/2022  HEP to be established for stretching and strengthening as needed. Baseline:  To be established. Goal status: INITIAL  2.  Pt  will be able  to perform rolling L and R w/ no more than minA in order to improve functional mobility. Baseline: modA Goal status: INITIAL  3.  Pt will perform sit<>supine requiring no more than minA in order to improve functional mobility. Baseline: modA Goal status: INITIAL  4.  Pt will perform bilateral reach outside seated BOS with feet supported without LOB in order to improve safety with ADL performance. Baseline: lateral LOB bilaterally demonstrated in w/c Goal status: INITIAL  LONG TERM GOALS: Target date: 12/10/2022  Pt will perform squat pivot transfer w/ no more than modA in order to improve access to home environment for toilet transfers. Baseline: minA for lateral scoot transfer, pt able to clear bottom intermittently Goal status: INITIAL  2.  Pt will perform sit<>supine requiring no more than supervision in order to improve transfers in and out of bed. Baseline:  modA Goal status: INITIAL  3.  Pt will report wound healing in order to return to day program at [facility]. Baseline:  Ischial wound Goal status: INITIAL  4.  Pt will be able to perform rolling L and R w/ no more than supervision in order to improve functional mobility. Baseline: modA Goal status: INITIAL  5.  Pt will perform uphill sliding board transfer w/ no more than modA in order to improve transfer in home environment. Baseline:  To be assessed. Goal status: INITIAL  6.  Pt will perform partial STS in stander or from elevated power w/c to counter w/ no more than modA in order to improve capacity for higher level transfers and access to uneven surfaces. Baseline: To be assessed. Goal status: INITIAL  ASSESSMENT:  CLINICAL IMPRESSION: Focus of skilled session today on addressing functional transfers, core and UE strengthening.  Pt tolerates all positions, including prone, without issues.  She was able to perform pressup into full elbow extension w/ single arm at a time.  Progressed seated  challenge for core to contralateral reaching task.  She may benefit from use of stander next session due to likely ongoing muscular fatigue and soreness per pt request.  Will continue per POC as pt continues to be motivated and benefit from skilled therapy to address independence with functional mobility.  OBJECTIVE IMPAIRMENTS: decreased balance, decreased coordination, decreased mobility, difficulty walking, decreased ROM, decreased strength, hypomobility, increased edema, impaired flexibility, impaired sensation, impaired tone, impaired UE functional use, improper body mechanics, postural dysfunction, and pain.   ACTIVITY LIMITATIONS: carrying, lifting, bending, sitting, standing, squatting, stairs, transfers, bed mobility, continence, bathing, toileting, dressing, reach over head, hygiene/grooming, locomotion level, and caring for others  PARTICIPATION LIMITATIONS: meal prep, cleaning, laundry, driving, and community activity  PERSONAL FACTORS: Age, Fitness, Past/current experiences, Time since onset of injury/illness/exacerbation, and 1-2 comorbidities: intermittent AD, neurogenic bowel/bladder on active bladder training  are also affecting patient's functional outcome.   REHAB POTENTIAL: Good  CLINICAL DECISION MAKING: Evolving/moderate complexity  EVALUATION COMPLEXITY: Moderate  PLAN:  PT FREQUENCY: 2x/week  PT DURATION: 8 weeks  PLANNED INTERVENTIONS: Therapeutic exercises, Therapeutic activity, Neuromuscular re-education, Balance training, Patient/Family education, Self Care, DME instructions, Electrical stimulation, Wheelchair mobility training, Manual therapy, and Re-evaluation  PLAN FOR NEXT SESSION:  Further assess and address multilevel slide board transfers.  Education on pressure relief for wound healing.  Core strength, static sitting balance-perturbations.  Bed mobility.  Use stander to practice weight-bearing and core as tolerated--monitor BP due to history of OH, can work  towards decreased sling support in standing, use of powderboard on mat table for RLE strengthening, wants  to work on increasing independence with lateral scoot transfers without SB and also wants to be able to place SB independently, Roland Rack, PT, DPT 10/28/2022, 7:01 AM

## 2022-10-27 NOTE — Therapy (Addendum)
OUTPATIENT OCCUPATIONAL THERAPY NEURO TREATMENT  Patient Name: [name] MRN: [id number] DOB:[birth year], 72 y.o., female Today's Date: 10/27/2022  PCP: [name], MD  REFERRING PROVIDER: [name], MD  END OF SESSION:  OT End of Session - 10/27/22 1019     Visit Number 4    Number of Visits 17    Date for OT Re-Evaluation 12/10/22    Authorization Type [employer] - requires auth, [place]    Progress Note Due on Visit 10    OT Start Time 1020    OT Stop Time 1100    OT Time Calculation (min) 40 min    Equipment Utilized During Treatment Patient in Power WC, sliding board, putty    Activity Tolerance Patient tolerated treatment well    Behavior During Therapy [facility] for tasks assessed/performed             Past Medical History:  Diagnosis Date   CERVICAL POLYP 03/11/2008   Qualifier: Diagnosis of  By: [name] MD, [name]    Colon polyps 2005   on colonscopy [name]   Fibroid 2004   Per Dr. [name]   History of shingles    face and mouth   Hx of skin cancer, basal cell    Rosacea    Sciatica of left side 09/28/2013   Scoliosis    noted on mri done for back pain   Past Surgical History:  Procedure Laterality Date   BUNIONECTOMY     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   Nerve pain 05/03/2022   Medication monitoring encounter 01/08/2022   Neurogenic bladder 10/11/2021   Urinary incontinence 10/11/2021   ESBL (extended spectrum beta-lactamase) producing bacteria infection 10/09/2021   Recurrent UTI 10/09/2021   Quadriplegia, C5-C7 incomplete (HCC) 01/16/2021   History of spinal fracture 01/16/2021   Suprapubic catheter (HCC) 01/16/2021   Encounter for routine gynecological examination 09/28/2013   Onychomycosis 09/28/2013   Foot deformity, acquired 03/26/2012   Encounter for preventive health  examination 12/25/2010   ROSACEA 08/25/2009   Disturbance in sleep behavior 03/11/2008   SKIN CANCER, HX OF 03/11/2008   DYSURIA, HX OF 03/11/2008   Hyperlipidemia 02/10/2007   CERVICALGIA 02/10/2007    ONSET DATE: 07/28/2020  Date of Referral 09/28/22  REFERRING DIAG: G82.54 (ICD-10-CM) - Quadriplegia, C5-C7, incomplete (HCC)  THERAPY DIAG:  Quadriplegia, C5-C7 incomplete (HCC)  Other lack of coordination  Muscle weakness (generalized)  Rationale for Evaluation and Treatment: Rehabilitation  SUBJECTIVE:   SUBJECTIVE STATEMENT: Patient went to the wound doctor on Monday and a Wound Vac has been ordered for her.  The wound is 0.8 cm deep with 1.5 cm tunneling and is .9 cm x .5 cm.  Patient is worried about having a wound vac on due to the noise of the machine.   Patient also asked for information about wedges to help with bed positioning as she reports that they do not have appropriate sized pillows at home to help with pressure relief.  Pt accompanied by:  Live in Caregiver - [name]   PERTINENT HISTORY: "Pt is a 72 yr old L handed female with hx of incomplete quadriplegia- 2/14 2022- fleeing the police in [place] on passenger 100 (high speed) miles/hour,  [name]; neurogenic bowel and bladder and spasticity; [name], [name] Here for f/u on Incomplete quadriplegia"  Referral from MD 09/28/22 states, "Please eval and treat for ADLs and higher level mobility."  PRECAUTIONS: Fall; suprapubic catheter (she wants to get this removed meaning she needs to get to and from the toilet); she has had minor heat sensation when needing to complete her bowel program-possible AD?   WEIGHT BEARING RESTRICTIONS: No-pt was in stander at most recent therapy in [place] last week.  She will have a bone density done soon w/ Dr. [name].   PAIN:  Patient reports chronic pain 3/10 fingers to elbow bilaterally managed by medication and will inform therapy staff of any  changes.  FALLS: Has patient fallen in last 6 months? No  LIVING ENVIRONMENT: Lives with: lives with their family - husband [name] and with an adult companion s/p moving back up from [place] x10 months Lives in: House/apartment Stairs:  4 story town house with an Engineer, structural with threshold adjustments, roll in shower with transport chair Has following equipment at home: Wheelchair (power) - with seat height adjustments to access counters and reclining option, Wheelchair (manual), transport WC, shower chair, and Ramped entry, handheld showerhead with rails around toilet, had Michiel Sites but is no longer in need of it, has slide boards x3  PLOF: Requires assistive device for independence, Needs assistance with ADLs, Needs assistance with homemaking, Needs assistance with gait, and Needs assistance with transfers; 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  Used to like to knit, sew and bake.  PATIENT GOALS: Wants to be able to type - currently using advanced Dragon dictation at times but prefers to type at times.  She would like to be able to write better and is interested in resuming some leisure activities such as Archivist and baking.  She also wants to keep working on being able to cut her own food and on her UE strength.   OBJECTIVE:   HAND DOMINANCE: Left  ADLs: Overall ADLs: Patient has a live in caregiver  Transfers/ambulation related to ADLs: Mod assist with sliding board transfers (previously [employer] lift).  Eating: Has a rocker knife that she can use. Used to use adapted utensils but now uses regular utensils but still will get assistance to cut food ie) when eating out.  Grooming: can brush her own hair but unable to manage jewelry ie) earrings  UB Dressing: can zip/unzip after it has been started, unable to manage buttons herself, Caregiver assists but if she has extra time, she can put on her bra, and a loose fitting pullover shirt/t-shirt  LB Dressing: dependent for LB  dressing in bed and with special sock donner for LE compression garments   Toileting: bladder trained with suprapubic catheter which she clamps off.  Dependent for bowel incontinence care.  Bathing: Sponge bath with adult washclothes.  Can bath UB with back scrubber for most of her back.  Needs help with feet (mentioned she might need a separate brush for feet)   Tub Shower transfers: Min-mod assist with slide board  Equipment: Shower seat with back, Walk in shower, bed side commode, Reacher, Sock aid, Long handled sponge, and Feeding equipment  IADLs: --  Shopping: Assisted by caregiver  Light housekeeping: Has housekeeper that comes monthly  Meal Prep: previously enjoyed baking. Assisted by caregiver but described recent success at reheating a meal for herself after getting food out of the fridge/freezer from her WC.  Community mobility: Dependent  Medication management: Caregiver sorts them into pillbox but she is very aware of her medications   Financial management: Patient  manages her own finances  Handwriting: Increased time and has a pen with a little grip  MOBILITY STATUS: Independent with power mobiity  POSTURE COMMENTS:  No Significant postural limitations and forward head Sitting balance: Supports self independently with both Ues  ACTIVITY TOLERANCE: Activity tolerance: Fair - MMT WFL but has limited sustained tolerance for ongoing use of UEs  FUNCTIONAL OUTCOME MEASURES: 10/20/22   UPPER EXTREMITY ROM:   AROM - WFL without obvious contractures, some digital flexion noted but PROM WNL   AROM Right (eval) Left (eval)  Shoulder flexion [facility]  Shoulder abduction [facility]  Elbow flexion [facility] WFL  Elbow extension [facility]  Wrist flexion [facility] WFL  Wrist extension WFL WFL  Ulnar deviation WFL Decreased ulnar  deviation past midline  Wrist pronation [facility] WFL  Wrist supination [facility]  Digit Composite Flexion Lacks full AROM:   1st digit - 5 cm   3rd digit -1 cm    4th digit - 2 cm  Lacks full AROM:   1st digit - 1.5 cm  5th digit - 3 cm   Digit Composite Extension [facility]  Digit Opposition Opposition to index finger only Lacks to pinkie due to limited DIP pinkie flexion  (Blank rows = not tested)  UPPER EXTREMITY MMT:   Grossly WFL - Endurance limited R tricep strength > than L but L UE generally stronger than R UE  MMT Right (eval) Left (eval)  Shoulder flexion 4/5 4/5  Shoulder abduction 4/5 4/5  Elbow flexion 4/5 4/5  Elbow extension 4/5 4-/5  (Blank rows = not tested)  HAND FUNCTION: Grip strength: Right: 4.8 lbs; Left: 20.9 lbs  COORDINATION: Finger Nose Finger test: R generally WFL, L WNL Box and Blocks:  Right 28 blocks, Left 37 blocks R hand finger eventually cramps and dexterity worsens in the cold  SENSATION: Light touch: Impaired  - patient   EDEMA: NA for UEs but LE has poor lymph drainage with custom compression garments   MUSCLE TONE: Generally WFL but will assess further  COGNITION: Overall cognitive status: Within functional limits for tasks assessed  VISION: Subjective report: Patent wears progressive lens/glasses.  Denies diplopia or vision changes but has eye exam in the next couple of months. Baseline vision: Wears glasses all the time  VISION ASSESSMENT: WFL  OBSERVATIONS: Patient independent with power WC navigation within clinic.  Patient is well-kept with foley catheter in place.  She has slight limitations in full extension of digits but PROM is WNL and she has splints at home that she said she will bring for OT staff to assess.  She has functional ROM of B UEs to reach her head, behind her back and to cross midline to assist with ADLs.  She had fairly    TODAY'S TREATMENT:  Self Care  Patient engaged in weight shifting to assess repositioning her bottom with good  success leaning to her R and lifting L side or leaning forward to the table top to take pressure off her bottom.  Patient and caregiver are shown different options of pressure relieving wedges but are also encouraged to consider trying a neck pillow to see if it is successful at floating the wound area in the bed.  They are encouraged to work on positioning first thing upon entering the bed or when awake and laying in bed in the morning to monitor comfort and success with positioning. .  Therapeutic Exercises Patient education engaged in theraputty exercises to work on finger isolation for "typing"  - Putty Squeezes  -  Recommended frequency: 1 x daily - 2 sets - 10 reps Patient encouraged to squeeze ball of putty into logs over the repetitions.  - Finger Pinch and Pull with Putty  - 1 x daily - 2 sets - 10 reps Recommended frequency: 1 x daily - 2 sets - 10 reps Patient encouraged to pinch one end of the putty between your fingers and thumb and pull it away to stretch the putty and practiced isolating individual fingers to thumb as able with some good success with L pinkie and thumb today.   - Tip PUSH with Putty  (modified from Tip "pinch" with putty) Recommended frequency: 1 x daily - 2 sets - 10 reps  Recommended using the tips of her fingers to gently push into the putty. Patient also engaged and encouraged to try and push in small button or object like "keys on a keyboard" moving up/down and side to side.  Finally, patient worked on isolated finger movements for typing activities atop putty and table top.  Patient does not use a modified keyboard at home but is noted to have increased isolation of finger extension with wrist in neutral and elbows out to the side rather than wrist ulnarly deviated and directly in front of her.  She also did better with her forearms supported on wheelchair armrest therefore was engaged in simulated typing activities using her slide board across her WC armrests  for forearm support with increased success isolating finger extension for lifting digits off keys/tabletop.    PATIENT EDUCATION: Education details: Reviewed - Bed positioning and pressure relief; Issues Theraputty Exercises x 3 Person educated: Patient and Caregiver Live in caregiver - [name] Education method: Explanation, Demonstration, Tactile cues, Verbal cues, and Handouts Education comprehension: verbalized understanding, returned demonstration, and needs further education  HOME EXERCISE PROGRAM: 10/27/22 - Theraputty  Access Code: [email or web address] URL: [email or web address] Date: 10/27/2022 Prepared by: Amada Kingfisher   GOALS:   SHORT TERM GOALS: Target date: 11/09/22  Patient will be able to use AE/modified techniques to cut soft foods small enough for oral intake. Baseline: Caregiver/spouse assist Goal status: INITIAL  2.  Patient will be able to use AE/modified positioning to complete word search by drawing lines through words with 0 errors. Baseline: TBD Goal status: INITIAL  3.  Patient will verbalize understanding of good pressure relief schedule (for 15 to 60 seconds every 15 to 60 minutes) to help with wound healing. Baseline: Patient did not change positioning in > 45 minutes of OT eval. Goal status: IN PROGRESS  4.  Patient will be assisted to explore modifications for leisure tasks (knit/sew/bake) with good return demonstration. Baseline: Minimal involvement Goal status: INITIAL  5.  Patient will demonstrate independence with HEP for UE strengthening, coordination and ROM  to prevent contractures and maintain strength for transfers and ADLs. Baseline: Previous HEPs have been established but need to be reviewed and updated.  Goal status: IN PROGRESS  6.  Patient will be assessed for typing speed/dexterity. Baseline: Patient reports difficulty with typing with all her fingers. Goal status: IN PROGRESS  7. Patient will complete Quick Dash UE  assessment. Baseline: TBD Goal Status: 10/20/22 Discontinued - see scores above   LONG TERM GOALS: Target date: 12/10/22  Patient will complete 1 small craft/week r/t her leisure interests to work on FMS daily. Baseline: Minimal involvement Goal status: INITIAL  2.  Patient will improve B coordination for increased typing speed/dexterity x 1-2 WPM. Baseline: TBD Goal status: INITIAL  3.  Patient will improve B UE coordination, strength and functional use to bake cookies with setup assistance and AE/modified techniques as appropriate.  Baseline: Not performed. Goal status: INITIAL  4.  Patient will report no more than moderate difficulty using a knife to foods such as chicken, small enough for oral intake using AE and strategies as needed. Baseline: Caregiver/spouse assist Goal status: INITIAL  5.  Patient will be able to use AE/modified positioning to complete 4 sentences with 100% legibility. Baseline: Subjective reports of difficulty with writing Goal status: INITIAL   ASSESSMENT:  CLINICAL IMPRESSION: Patient seen today for occupational therapy today with review of weight shifting and bed postioning recommendations for pressure relief as well as progression of FM strength and tasks.   Patient has good understanding of various activities presented to work on ROM, strength etc and continues to ask appropriate questions to modify and progress activities.   Pt continues to benefit from ongoing skilled OT services in the outpatient setting to work on impairments in B UE ROM, strength and coordination to help pt return to highest level of independence with self care, work and leisure activities.     PERFORMANCE DEFICITS: in functional skills including ADLs, IADLs, coordination, dexterity, strength, muscle spasms, Fine motor control, Gross motor control, continence, skin integrity, and UE functional use,   IMPAIRMENTS: are limiting patient from ADLs, IADLs, work, and leisure.    CO-MORBIDITIES: has co-morbidities such as incontinence and wound  that affects occupational performance. Patient will benefit from skilled OT to address above impairments and improve overall function.  REHAB POTENTIAL: Fair due to chronicity of injury   PLAN:  OT FREQUENCY: 2x/week  OT DURATION: 8 weeks  PLANNED INTERVENTIONS: self care/ADL training, therapeutic exercise, therapeutic activity, neuromuscular re-education, manual therapy, passive range of motion, balance training, functional mobility training, splinting, patient/family education, energy conservation, coping strategies training, and DME and/or AE instructions  RECOMMENDED OTHER SERVICES: Patient was seen for PT evaluation today with treatment plans coordinated for 2x/week.  CONSULTED AND AGREED WITH PLAN OF CARE: Patient and family member/caregiver  PLAN FOR NEXT SESSION:  Review bed positioning success.  Progress written ROM/strengthening HEP including putty.  Need to look at OT goals for further activities also ie) Continue FM tasks to assess and improve typing and simple hobby/craft activities.   As needed - will provide information re: prefab soft splints/orthotist option.   Victorino Sparrow, OT 10/27/2022, 12:17 PM

## 2022-10-28 ENCOUNTER — Ambulatory Visit: Payer: Medicare PPO | Admitting: Physical Therapy

## 2022-10-28 ENCOUNTER — Ambulatory Visit: Payer: Medicare PPO | Admitting: Occupational Therapy

## 2022-10-28 DIAGNOSIS — R29898 Other symptoms and signs involving the musculoskeletal system: Secondary | ICD-10-CM | POA: Diagnosis not present

## 2022-10-28 DIAGNOSIS — R293 Abnormal posture: Secondary | ICD-10-CM

## 2022-10-28 DIAGNOSIS — G8254 Quadriplegia, C5-C7 incomplete: Secondary | ICD-10-CM

## 2022-10-28 DIAGNOSIS — M24542 Contracture, left hand: Secondary | ICD-10-CM | POA: Diagnosis not present

## 2022-10-28 DIAGNOSIS — M6281 Muscle weakness (generalized): Secondary | ICD-10-CM

## 2022-10-28 DIAGNOSIS — R278 Other lack of coordination: Secondary | ICD-10-CM | POA: Diagnosis not present

## 2022-10-28 DIAGNOSIS — G8253 Quadriplegia, C5-C7 complete: Secondary | ICD-10-CM | POA: Diagnosis not present

## 2022-10-28 DIAGNOSIS — M24541 Contracture, right hand: Secondary | ICD-10-CM | POA: Diagnosis not present

## 2022-10-28 DIAGNOSIS — R29818 Other symptoms and signs involving the nervous system: Secondary | ICD-10-CM | POA: Diagnosis not present

## 2022-10-28 NOTE — Therapy (Signed)
OUTPATIENT OCCUPATIONAL THERAPY NEURO TREATMENT  Patient Name: [name] MRN: [id number] DOB:1950/08/31, 72 y.o., female Today's Date: 10/28/2022  PCP: Carmen Headings, MD  REFERRING PROVIDER: [name], MD  END OF SESSION:  OT End of Session - 10/28/22 1054     Visit Number 5    OT Start Time 1100    OT Stop Time 1145    OT Time Calculation (min) 45 min    Activity Tolerance Patient tolerated treatment well    Behavior During Therapy [facility] for tasks assessed/performed             Past Medical History:  Diagnosis Date   CERVICAL POLYP 03/11/2008   Qualifier: Diagnosis of  By: Carmen Sharp MD, [name]    Colon polyps 2005   on colonscopy [name]   Fibroid 2004   Per [name]   History of shingles    face and mouth   Hx of skin cancer, basal cell    Rosacea    Sciatica of left side 09/28/2013   Scoliosis    noted on mri done for back pain   Past Surgical History:  Procedure Laterality Date   BUNIONECTOMY     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   Nerve pain 05/03/2022   Medication monitoring encounter 01/08/2022   Neurogenic bladder 10/11/2021   Urinary incontinence 10/11/2021   ESBL (extended spectrum beta-lactamase) producing bacteria infection 10/09/2021   Recurrent UTI 10/09/2021   Quadriplegia, C5-C7 incomplete (HCC) 01/16/2021   History of spinal fracture 01/16/2021   Suprapubic catheter (HCC) 01/16/2021   Encounter for routine gynecological examination 09/28/2013   Onychomycosis 09/28/2013   Foot deformity, acquired 03/26/2012   Encounter for preventive health examination 12/25/2010   ROSACEA 08/25/2009   Disturbance in sleep behavior 03/11/2008   SKIN CANCER, HX OF 03/11/2008   DYSURIA, HX OF 03/11/2008   Hyperlipidemia 02/10/2007   CERVICALGIA 02/10/2007    ONSET DATE: 07/28/2020  Date of  Referral 09/28/22  REFERRING DIAG: G82.54 (ICD-10-CM) - Quadriplegia, C5-C7, incomplete (HCC)  THERAPY DIAG:  Muscle weakness (generalized)  Other lack of coordination  Abnormal posture  Quadriplegia, C5-C7 incomplete (HCC)  Rationale for Evaluation and Treatment: Rehabilitation  SUBJECTIVE:   SUBJECTIVE STATEMENT: Patient reports ordering a couple neck pillows to help with pressure relief for wound.  They tried some pillows last night for a couple of hours, but she had to remove them to allow her to sleep without leg spasms.  Pt accompanied by:  Live in Caregiver - [name]   PERTINENT HISTORY: "Pt is a 72 yr old L handed female with hx of incomplete quadriplegia- 2/14 2022- fleeing the police in [place] on passenger 100 (high speed) miles/hour,  [name]; neurogenic bowel and bladder and spasticity; [name], [name] Here for f/u on Incomplete quadriplegia"  Referral from MD 09/28/22 states, "Please eval and treat for ADLs and higher level mobility."  PRECAUTIONS: Fall; suprapubic catheter (she wants to get this removed meaning she needs to get to and from the toilet); she has had minor heat sensation when needing to complete her bowel program-possible AD?   WEIGHT BEARING RESTRICTIONS: No-pt was in stander at most recent therapy in [place] last week.  She will have a bone density done soon w/ Dr. Fabian [name].   PAIN:  Patient reports chronic pain 3/10 fingers to  elbow bilaterally managed by medication and will inform therapy staff of any changes.  FALLS: Has patient fallen in last 6 months? No  LIVING ENVIRONMENT: Lives with: lives with their family - husband [name] and with an adult companion s/p moving back up from [place] x10 months Lives in: House/apartment Stairs:  4 story town house with an Engineer, structural with threshold adjustments, roll in shower with transport chair Has following equipment at home: Wheelchair (power) - with seat height adjustments to access  counters and reclining option, Wheelchair (manual), transport WC, shower chair, and Ramped entry, handheld showerhead with rails around toilet, had Michiel Sites but is no longer in need of it, has slide boards x3  PLOF: Requires assistive device for independence, Needs assistance with ADLs, Needs assistance with homemaking, Needs assistance with gait, and Needs assistance with transfers; 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  Used to like to knit, sew and bake.  PATIENT GOALS: Wants to be able to type - currently using advanced Dragon dictation at times but prefers to type at times.  She would like to be able to write better and is interested in resuming some leisure activities such as Archivist and baking.  She also wants to keep working on being able to cut her own food and on her UE strength.   OBJECTIVE:   HAND DOMINANCE: Left  ADLs: Overall ADLs: Patient has a live in caregiver  Transfers/ambulation related to ADLs: Mod assist with sliding board transfers (previously [employer] lift).  Eating: Has a rocker knife that she can use. Used to use adapted utensils but now uses regular utensils but still will get assistance to cut food ie) when eating out.  Grooming: can brush her own hair but unable to manage jewelry ie) earrings  UB Dressing: can zip/unzip after it has been started, unable to manage buttons herself, Caregiver assists but if she has extra time, she can put on her bra, and a loose fitting pullover shirt/t-shirt  LB Dressing: dependent for LB dressing in bed and with special sock donner for LE compression garments   Toileting: bladder trained with suprapubic catheter which she clamps off.  Dependent for bowel incontinence care.  Bathing: Sponge bath with adult washclothes.  Can bath UB with back scrubber for most of her back.  Needs help with feet (mentioned she might need a separate brush for feet)   Tub Shower transfers: Min-mod assist with slide board  Equipment:  Shower seat with back, Walk in shower, bed side commode, Reacher, Sock aid, Long handled sponge, and Feeding equipment  IADLs: --  Shopping: Assisted by caregiver  Light housekeeping: Has housekeeper that comes monthly  Meal Prep: previously enjoyed baking. Assisted by caregiver but described recent success at reheating a meal for herself after getting food out of the fridge/freezer from her WC.  Community mobility: Dependent  Medication management: Caregiver sorts them into pillbox but she is very aware of her medications   Financial management: Patient manages her own finances  Handwriting: Increased time and has a pen with a little grip  MOBILITY STATUS: Independent with power mobiity  POSTURE COMMENTS:  No Significant postural limitations and forward head Sitting balance: Supports self independently with both Ues  ACTIVITY TOLERANCE: Activity tolerance: Fair - MMT WFL but has limited sustained tolerance for ongoing use of UEs  FUNCTIONAL OUTCOME MEASURES: 10/20/22   UPPER EXTREMITY ROM:   AROM - WFL without obvious contractures, some digital flexion noted but PROM WNL   AROM Right (  eval) Left (eval)  Shoulder flexion [facility]  Shoulder abduction [facility]  Elbow flexion [facility] WFL  Elbow extension [facility]  Wrist flexion [facility] WFL  Wrist extension WFL WFL  Ulnar deviation WFL Decreased ulnar  deviation past midline  Wrist pronation [facility] WFL  Wrist supination [facility]  Digit Composite Flexion Lacks full AROM:   1st digit - 5 cm   3rd digit -1 cm   4th digit - 2 cm  Lacks full AROM:   1st digit - 1.5 cm  5th digit - 3 cm   Digit Composite Extension [facility]  Digit Opposition Opposition to index finger only Lacks to pinkie due to limited DIP pinkie flexion  (Blank rows = not tested)  UPPER EXTREMITY MMT:   Grossly WFL - Endurance limited R tricep strength > than L but L UE generally stronger than R UE  MMT Right (eval) Left (eval)  Shoulder flexion 4/5 4/5   Shoulder abduction 4/5 4/5  Elbow flexion 4/5 4/5  Elbow extension 4/5 4-/5  (Blank rows = not tested)  HAND FUNCTION: Grip strength: Right: 4.8 lbs; Left: 20.9 lbs  COORDINATION: Finger Nose Finger test: R generally WFL, L WNL Box and Blocks:  Right 28 blocks, Left 37 blocks R hand finger eventually cramps and dexterity worsens in the cold  SENSATION: Light touch: Impaired  - patient   EDEMA: NA for UEs but LE has poor lymph drainage with custom compression garments   MUSCLE TONE: Generally WFL but will assess further  COGNITION: Overall cognitive status: Within functional limits for tasks assessed  VISION: Subjective report: Patent wears progressive lens/glasses.  Denies diplopia or vision changes but has eye exam in the next couple of months. Baseline vision: Wears glasses all the time  VISION ASSESSMENT: WFL  OBSERVATIONS: Patient independent with power WC navigation within clinic.  Patient is well-kept with foley catheter in place.  She has slight limitations in full extension of digits but PROM is WNL and she has splints at home that she said she will bring for OT staff to assess.  She has functional ROM of B UEs to reach her head, behind her back and to cross midline to assist with ADLs.  She had fairly    TODAY'S TREATMENT:                                                                                                                                  Neuromuscular Reeducation Neuro re-education completed for duration as designed to help improve balance and muscle control needed for unsupported sitting, crossing midline and 1 hand engaged in activities. Pt edge of mat activities to reach cones to her L and R side (elevated surface on L side).   First time she reached across midline to obtain cone from beside her and then to stack it atop cone on the same side ... L hand (R to L) then R hand (  L to R)  Next she got a cone from the same side and moved it to the opposite  side to stack it.... L hand (L to R) and R hand (R to L).    The only time she needed modifications was moving R UE to elevated surface to stack cone.  She was able to remove cone form elevated surface but had more difficulty lifting it to elevated surface on her L side.   Patient requiring min verbal, visual and tactile cues for neuromuscular re-education to ensure proper form/movement. Pt education provided for how movements translate to ADLs, balance and posture. Pt receptive and accepting to hands-on guidance, visual feedback of mirror and verbal cues to improve symmetrical posture  Self Care Patient engaged in education re: further bed  positioning with recommendation for removal of various layers of pads between her and air mattress to avoid risk for wrinkles etc causing pressure areas. Patient engaged in simulated cutting activities with different rocker nights.  She reports using her knife at home last night but was unable to cut her steak.  Patient has a horizontal handle at home and practiced both horizontal and vertical handles to cut different resisted Theraputtys.  Patient actually performed better with the vertical handle in L hand and using R hand on top to provide additional push power during cutting motions.  Therapeutic Activities Patient worked on isolating finger movements for typing activities atop pennies on table top. She is engaged in working on isolation of finger flexion/extension and side to side motions (especially with index fingers to simulate keyboard) with pennies under individual digits ie)  sliding pennies back and forth,  moving fingers from one penny to another,  sliding pennies side to side and  working on stabilizing medial digits while isolating index finger.  She did better again with her forearms supported on tabletop and is encouraged to work on isolating finger motions for lifting digits off keys/tabletop.  Patient mentioned wanting to order a new keyboard  and was encouraged to check modified keyboard at [employer] or [employer] to see if she likes it before ordering one.  She is encouraged to have a wrist support for typing though.   PATIENT EDUCATION: Education details: Reviewed - Bed positioning and pressure relief; and updating FM tasks with penny activity completed today (no handouts given) Person educated: Patient and Caregiver Live in caregiver - [name] Education method: Explanation, Demonstration, Tactile cues, and Verbal cues Education comprehension: verbalized understanding, returned demonstration, and needs further education  HOME EXERCISE PROGRAM: 10/27/22 - Theraputty  Access Code: [email or web address] URL: [email or web address] Date: 10/27/2022 Prepared by: Amada Kingfisher   GOALS:   SHORT TERM GOALS: Target date: 11/09/22  Patient will be able to use AE/modified techniques to cut soft foods small enough for oral intake. Baseline: Caregiver/spouse assist Goal status: IN PROGRESS  2.  Patient will be able to use AE/modified positioning to complete word search by drawing lines through words with 0 errors. Baseline: TBD Goal status: INITIAL  3.  Patient will verbalize understanding of good pressure relief schedule (for 15 to 60 seconds every 15 to 60 minutes) to help with wound healing. Baseline: Patient did not change positioning in > 45 minutes of OT eval. Goal status: IN PROGRESS  4.  Patient will be assisted to explore modifications for leisure tasks (knit/sew/bake) with good return demonstration. Baseline: Minimal involvement Goal status: INITIAL  5.  Patient will demonstrate independence with HEP for UE strengthening, coordination and ROM to prevent contractures and  maintain strength for transfers and ADLs. Baseline: Previous HEPs have been established but need to be reviewed and updated.  Goal status: IN PROGRESS  6.  Patient will be assessed for typing speed/dexterity. Baseline: Patient reports difficulty with  typing with all her fingers. Goal status: IN PROGRESS  7. Patient will complete Quick Dash UE assessment. Baseline: TBD Goal Status: 10/20/22 Discontinued - see scores above   LONG TERM GOALS: Target date: 12/10/22  Patient will complete 1 small craft/week r/t her leisure interests to work on FMS daily. Baseline: Minimal involvement Goal status: INITIAL  2.  Patient will improve B coordination for increased typing speed/dexterity x 1-2 WPM. Baseline: TBD Goal status: IN PROGRESS  3.  Patient will improve B UE coordination, strength and functional use to bake cookies with setup assistance and AE/modified techniques as appropriate.  Baseline: Not performed. Goal status: IN PROGRESS  4.  Patient will report no more than moderate difficulty using a knife to foods such as chicken, small enough for oral intake using AE and strategies as needed. Baseline: Caregiver/spouse assist Goal status: IN PROGRESS  5.  Patient will be able to use AE/modified positioning to complete 4 sentences with 100% legibility. Baseline: Subjective reports of difficulty with writing Goal status: INITIAL   ASSESSMENT:  CLINICAL IMPRESSION: Patient seen today for occupational therapy today with review of weight shifting and bed postioning recommendations for pressure relief as well as progression of FM skills and isolated finger movements.   Patient worked well in unsupported sitting today for UE reaching, crossing midline and balance training for ADLs.  Patient is receptive to new activities to work on strength, coordination and motor control with encouraged to work on something everyday as her repertoire is extensive for HEP ideas. Pt continues to benefit from ongoing skilled OT services in the outpatient setting to work on impairments in B UE ROM, strength and coordination to help pt return to highest level of independence with self care, work and leisure activities.     PERFORMANCE DEFICITS: in functional  skills including ADLs, IADLs, coordination, dexterity, strength, muscle spasms, Fine motor control, Gross motor control, continence, skin integrity, and UE functional use,   IMPAIRMENTS: are limiting patient from ADLs, IADLs, work, and leisure.   CO-MORBIDITIES: has co-morbidities such as incontinence and wound  that affects occupational performance. Patient will benefit from skilled OT to address above impairments and improve overall function.  REHAB POTENTIAL: Fair due to chronicity of injury   PLAN:  OT FREQUENCY: 2x/week  OT DURATION: 8 weeks  PLANNED INTERVENTIONS: self care/ADL training, therapeutic exercise, therapeutic activity, neuromuscular re-education, manual therapy, passive range of motion, balance training, functional mobility training, splinting, patient/family education, energy conservation, coping strategies training, and DME and/or AE instructions  RECOMMENDED OTHER SERVICES: Patient was seen for PT evaluation today with treatment plans coordinated for 2x/week.  CONSULTED AND AGREED WITH PLAN OF CARE: Patient and family member/caregiver  PLAN FOR NEXT SESSION:  Review bed positioning success.  Progress written ROM/strengthening HEP including putty.    Need to look at OT goals for further activities also ie) Continue FM tasks to assess and improve typing and simple hobby/craft activities (ie baking, yarn activities).   As needed - will provide information re: prefab soft splints/orthotist option.   Victorino Sparrow, OT 10/28/2022, 12:44 PM

## 2022-10-28 NOTE — Therapy (Signed)
OUTPATIENT PHYSICAL THERAPY NEURO TREATMENT   Patient Name: [name] MRN: [id number] DOB:06/03/1951, 72 y.o., female Today's Date: 10/28/2022   PCP: [name], MD REFERRING PROVIDER: [name], MD  END OF SESSION:  PT End of Session - 10/28/22 1016     Visit Number 5    Number of Visits 17   16 + eval   Date for PT Re-Evaluation 12/10/22    Authorization Type [employer] MEDICARE    Progress Note Due on Visit 10    PT Start Time 1015    PT Stop Time 1057    PT Time Calculation (min) 42 min    Equipment Utilized During Treatment Gait belt    Activity Tolerance Patient tolerated treatment well    Behavior During Therapy WFL for tasks assessed/performed                Past Medical History:  Diagnosis Date   CERVICAL POLYP 03/11/2008   Qualifier: Diagnosis of  By: [name] MD, [name]    Colon polyps 2005   on colonscopy [name]   Fibroid 2004   Per Dr. [name]   History of shingles    face and mouth   Hx of skin cancer, basal cell    Rosacea    Sciatica of left side 09/28/2013   Scoliosis    noted on mri done for back pain   Past Surgical History:  Procedure Laterality Date   BUNIONECTOMY     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   Nerve pain 05/03/2022   Medication monitoring encounter 01/08/2022   Neurogenic bladder 10/11/2021   Urinary incontinence 10/11/2021   ESBL (extended spectrum beta-lactamase) producing bacteria infection 10/09/2021   Recurrent UTI 10/09/2021   Quadriplegia, C5-C7 incomplete (HCC) 01/16/2021   History of spinal fracture 01/16/2021   Suprapubic catheter (HCC) 01/16/2021   Encounter for routine gynecological examination 09/28/2013   Onychomycosis 09/28/2013   Foot deformity, acquired 03/26/2012   Encounter for preventive health examination 12/25/2010   ROSACEA 08/25/2009    Disturbance in sleep behavior 03/11/2008   SKIN CANCER, HX OF 03/11/2008   DYSURIA, HX OF 03/11/2008   Hyperlipidemia 02/10/2007   CERVICALGIA 02/10/2007    ONSET DATE: 09/28/2022 (most recent referral)  REFERRING DIAG: G82.54 (ICD-10-CM) - Quadriplegia, C5-C7, incomplete (HCC)  THERAPY DIAG:  Muscle weakness (generalized)  Other lack of coordination  Abnormal posture  Quadriplegia, C5-C7 incomplete (HCC)  Rationale for Evaluation and Treatment: Rehabilitation  SUBJECTIVE:  SUBJECTIVE STATEMENT: Pt with some arm soreness after yesterday's therapy sessions, no other complaints.  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 Yes: NPRS scale: 3/10 Pain location: forearms to fingertips Pain description: constant, pinprick/tingling Aggravating factors: nighttime Relieving factors: nothing, sometimes medicines  PRECAUTIONS: Fall; suprapubic catheter (she wants to get this removed meaning she needs to get to and from the toilet); she has had minor heat sensation when needing to complete her bowel program-possible AD?  WEIGHT BEARING RESTRICTIONS: No-pt was in stander at most recent therapy in [place] last week.  She will have a bone density done soon w/ Dr. [name].  FALLS: Has patient fallen in last 6 months? No  LIVING ENVIRONMENT: Lives with: lives with their spouse and live-in nurse [name] Lives in: House/apartment-townhouse Stairs: No-level entry, but multi-level home 4 stories w/ elevator Has following equipment at home: Wheelchair (power), Wheelchair (manual), Grab bars, and standing frame, sliding board, transport shower chair, handheld shower head, leg  strap-pt reports she no longer finds this helpful  PLOF: Requires assistive device for independence, Needs assistance with ADLs, Needs assistance with homemaking, and Needs assistance with transfers  OCCUPATION:  Writer-uses Dragon to dictate  PATIENT GOALS: "Make my right leg work."  Stand and pivot so she can more easily access a toilet.  OBJECTIVE:   DIAGNOSTIC FINDINGS: No recent relevant imaging.  Bone density scheduled 10/15/2022.  COGNITION: Overall cognitive status: Within functional limits for tasks assessed   SENSATION: Light touch: Diminished ability to distinguish sharp and dull, but able to distinguish light touch from injury level down accurately  COORDINATION: Not formally assessed.  EDEMA:  Well managed w/ lymphatic massage and compression stockings.  MUSCLE TONE: Pt has intermittent clonus during transfers.  POSTURE: rounded shoulders, posterior pelvic tilt, and right thoracic scoliosis   LOWER EXTREMITY ROM:     Passive  Right 10/20/22 Left 10/20/22  Hip flexion [facility]  Hip extension    Hip abduction    Hip adduction    Hip internal rotation [facility] WFL  Hip external rotation [facility]  Knee flexion [facility] WFL  Knee extension [facility] WFL  Ankle dorsiflexion Slight PF contracture Slight PF contracture  Ankle plantarflexion    Ankle inversion    Ankle eversion     (Blank rows = not tested)    LOWER EXTREMITY MMT:    MMT Right 10/20/22 Left 10/20/22  Hip flexion 1 2+  Hip extension    Hip abduction    Hip adduction    Hip internal rotation    Hip external rotation    Knee flexion 2- 3  Knee extension 2- 3  Ankle dorsiflexion 0 3  Ankle plantarflexion    Ankle inversion    Ankle eversion     (Blank rows = not tested)   BED MOBILITY:  Sit to supine Mod A Supine to sit Mod A Rolling to Right Mod A Rolling to Left Mod A Undulating mattress for wound management on standard bed (elevated-so often doing uphill sliding board transfers); she would like to  continue working on sitting up independently, she has been working on rolling, needs less assistance w/ this when someone props her leg into hooklying; would like something to help her pull her left leg to her butt for stretching as well as bed mobility.  TRANSFERS: Pt performs lateral scoot over to right  then left wc <> mat minA for foot positioning only, pt is on bladder training so catheter clamped off during transfer but with therapist to maintain proximity.  Pt able to intermittently PF/push w/ LLE when positioned in mechanically advantageous position by therapist.  FUNCTIONAL TESTS:  None relevant to pt's current functional level and abilities.  PATIENT SURVEYS:  None completed due to time.  TODAY'S TREATMENT:       TherAct Pt received seated in PWC with her aide [name] present. [name] hands-on and assisting pt with transfers during session. Lateral scoot transfer PWC to mat table with min A needed for some repositioning of her BLE. Session focus on use of standing frame for weight-bearing through BLE for bone health, spasticity management, bowel/bladder health, stretching of BLE, and to improve upright tolerance. BP monitored in sitting and standing during time spent in standing frame, see below. Pt with good response to standing this date, able to stand x 15 min in standing frame with assist from sling. Pt does have onset of a "touch of lightheadedness" after standing x 10 min. Loosened sling in standing and had pt work on activation of her glutes 3 x 10 reps for hip extension and upright posture. Pt with more L glute activation as compared to her R glute and has to initiate L glutes before initiation of R glutes. R glutes do fatigue more quickly than L as well, therapist palpating muscles during exercise with pt consent. Pt left seated EOM at end of session for hand-off to OT.   Vitals during therapy session: Seated EOM: BP 119/65 Initial standing in frame: 92/72, HR 95 Standing x 10 min:  98/62, HR 91 Standing x 15 min: 93/56, HR 88; Returned to sitting at this point due to drop in BP and pt having reaching 15 min goal.  PATIENT EDUCATION: Education details: BP management and purpose of standing Person educated: Patient and Arts administrator Education method: Explanation Education comprehension: verbalized understanding  HOME EXERCISE PROGRAM: Will be established as needed as pt has done continuous therapy and is working towards functional tasks.  GOALS: Goals reviewed with patient? Yes  SHORT TERM GOALS: Target date: 11/12/2022  HEP to be established for stretching and strengthening as needed. Baseline:  To be established. Goal status: INITIAL  2.  Pt will be able to perform rolling L and R w/ no more than minA in order to improve functional mobility. Baseline: modA Goal status: INITIAL  3.  Pt will perform sit<>supine requiring no more than minA in order to improve functional mobility. Baseline: modA Goal status: INITIAL  4.  Pt will perform bilateral reach outside seated BOS with feet supported without LOB in order to improve safety with ADL performance. Baseline: lateral LOB bilaterally demonstrated in w/c Goal status: INITIAL  LONG TERM GOALS: Target date: 12/10/2022  Pt will perform squat pivot transfer w/ no more than modA in order to improve access to home environment for toilet transfers. Baseline: minA for lateral scoot transfer, pt able to clear bottom intermittently Goal status: INITIAL  2.  Pt will perform sit<>supine requiring no more than supervision in order to improve transfers in and out of bed. Baseline:  modA Goal status: INITIAL  3.  Pt will report wound healing in order to return to [facility] program [facility]. Baseline:  Ischial wound Goal status: INITIAL  4.  Pt will be able to perform rolling L and R w/ no more than supervision in order to improve functional mobility. Baseline: modA  Goal status: INITIAL  5.  Pt will perform  uphill sliding board transfer w/ no more than modA in order to improve transfer in home environment. Baseline:  To be assessed. Goal status: INITIAL  6.  Pt will perform partial STS in stander or from elevated power w/c to counter w/ no more than modA in order to improve capacity for higher level transfers and access to uneven surfaces. Baseline: To be assessed. Goal status: INITIAL  ASSESSMENT:  CLINICAL IMPRESSION: Emphasis of skilled PT session on working on use of standing frame for upright tolerance and weight bearing through BLE and monitoring of vitals during standing this session. Pt exhibits good tolerance for standing this date but does have drop in BP and some symptoms of OH after standing x 10 min. Pt also able to utilize her BUE for decreased reliance on sling for more upright posture in standing as well as work on activating her glutes for hip extension. Pt is fatigued following standing x 15 min this session and does require increased assist for RLE management for repositioning of mat table at end of session. Pt also noted to have increased BLE clonus (R>L) this date. Pt continues to benefit from skilled therapy services to work towards LTGs. Continue POC.     OBJECTIVE IMPAIRMENTS: decreased balance, decreased coordination, decreased mobility, difficulty walking, decreased ROM, decreased strength, hypomobility, increased edema, impaired flexibility, impaired sensation, impaired tone, impaired UE functional use, improper body mechanics, postural dysfunction, and pain.   ACTIVITY LIMITATIONS: carrying, lifting, bending, sitting, standing, squatting, stairs, transfers, bed mobility, continence, bathing, toileting, dressing, reach over head, hygiene/grooming, locomotion level, and caring for others  PARTICIPATION LIMITATIONS: meal prep, cleaning, laundry, driving, and community activity  PERSONAL FACTORS: Age, Fitness, Past/current experiences, Time since onset of  injury/illness/exacerbation, and 1-2 comorbidities: intermittent AD, neurogenic bowel/bladder on active bladder training  are also affecting patient's functional outcome.   REHAB POTENTIAL: Good  CLINICAL DECISION MAKING: Evolving/moderate complexity  EVALUATION COMPLEXITY: Moderate  PLAN:  PT FREQUENCY: 2x/week  PT DURATION: 8 weeks  PLANNED INTERVENTIONS: Therapeutic exercises, Therapeutic activity, Neuromuscular re-education, Balance training, Patient/Family education, Self Care, DME instructions, Electrical stimulation, Wheelchair mobility training, Manual therapy, and Re-evaluation  PLAN FOR NEXT SESSION:  Further assess and address multilevel slide board transfers.  Education on pressure relief for wound healing.  Core strength, static sitting balance-perturbations.  Bed mobility.  Use stander to practice weight-bearing and core as tolerated--monitor BP due to history of OH, can work towards decreased sling support in standing, use of powderboard on mat table for RLE strengthening, wants to work on increasing independence with lateral scoot transfers without SB and also wants to be able to place SB independently and move her LE more independently  Peter Congo, PT, DPT, CSRS 10/28/2022, 10:58 AM

## 2022-11-01 ENCOUNTER — Encounter: Payer: Self-pay | Admitting: Internal Medicine

## 2022-11-01 ENCOUNTER — Other Ambulatory Visit: Payer: Self-pay | Admitting: Infectious Diseases

## 2022-11-01 ENCOUNTER — Encounter (HOSPITAL_BASED_OUTPATIENT_CLINIC_OR_DEPARTMENT_OTHER): Payer: Medicare PPO | Admitting: Internal Medicine

## 2022-11-01 DIAGNOSIS — T798XXA Other early complications of trauma, initial encounter: Secondary | ICD-10-CM

## 2022-11-01 DIAGNOSIS — G8250 Quadriplegia, unspecified: Secondary | ICD-10-CM

## 2022-11-01 DIAGNOSIS — G8254 Quadriplegia, C5-C7 incomplete: Secondary | ICD-10-CM

## 2022-11-01 DIAGNOSIS — L89323 Pressure ulcer of left buttock, stage 3: Secondary | ICD-10-CM | POA: Diagnosis not present

## 2022-11-01 DIAGNOSIS — Z993 Dependence on wheelchair: Secondary | ICD-10-CM

## 2022-11-01 DIAGNOSIS — M810 Age-related osteoporosis without current pathological fracture: Secondary | ICD-10-CM

## 2022-11-01 NOTE — Progress Notes (Signed)
Results   wbc slight low  not concerning at this time . Kidney function gfr is nl  elevated bum could be from dehydration or diet  slightly elevated  alt could be from medication  not alarming  a common finding .  Suggest  we repeat lft in 2-3 monhts  with hep c aby ,hep b ag and aby screen

## 2022-11-01 NOTE — Telephone Encounter (Signed)
Appt 5/21.

## 2022-11-02 ENCOUNTER — Encounter: Payer: Self-pay | Admitting: Infectious Diseases

## 2022-11-02 ENCOUNTER — Ambulatory Visit: Payer: Medicare PPO | Admitting: Infectious Diseases

## 2022-11-02 ENCOUNTER — Other Ambulatory Visit: Payer: Self-pay

## 2022-11-02 VITALS — BP 93/52 | HR 76 | Temp 97.9°F

## 2022-11-02 DIAGNOSIS — Z5181 Encounter for therapeutic drug level monitoring: Secondary | ICD-10-CM | POA: Diagnosis not present

## 2022-11-02 DIAGNOSIS — S31829A Unspecified open wound of left buttock, initial encounter: Secondary | ICD-10-CM

## 2022-11-02 DIAGNOSIS — N319 Neuromuscular dysfunction of bladder, unspecified: Secondary | ICD-10-CM

## 2022-11-02 DIAGNOSIS — G8250 Quadriplegia, unspecified: Secondary | ICD-10-CM | POA: Diagnosis not present

## 2022-11-02 DIAGNOSIS — R32 Unspecified urinary incontinence: Secondary | ICD-10-CM | POA: Diagnosis not present

## 2022-11-02 DIAGNOSIS — Z87440 Personal history of urinary (tract) infections: Secondary | ICD-10-CM

## 2022-11-02 DIAGNOSIS — N390 Urinary tract infection, site not specified: Secondary | ICD-10-CM

## 2022-11-02 LAB — CBC
HCT: 38.9 % (ref 35.0–45.0)
Hemoglobin: 12.8 g/dL (ref 11.7–15.5)
RBC: 4.11 10*6/uL (ref 3.80–5.10)
WBC: 6.7 10*3/uL (ref 3.8–10.8)

## 2022-11-02 LAB — SEDIMENTATION RATE: Sed Rate: 39 mm/h — ABNORMAL HIGH (ref 0–30)

## 2022-11-02 MED ORDER — METHENAMINE HIPPURATE 1 G PO TABS: 1.0000 g | ORAL_TABLET | Freq: Two times a day (BID) | ORAL | 5 refills | Status: DC

## 2022-11-02 MED ORDER — ASCORBIC ACID 1000 MG PO TABS: 1000.0000 mg | ORAL_TABLET | Freq: Four times a day (QID) | ORAL | 5 refills | Status: DC

## 2022-11-02 MED ORDER — AMOXICILLIN-POT CLAVULANATE 875-125 MG PO TABS: 1.0000 | ORAL_TABLET | Freq: Two times a day (BID) | ORAL | 0 refills | Status: DC

## 2022-11-02 NOTE — Progress Notes (Signed)
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   Nerve pain 05/03/2022   Medication monitoring encounter 01/08/2022   Neurogenic bladder 10/11/2021   Urinary incontinence 10/11/2021   ESBL (extended spectrum beta-lactamase) producing bacteria infection 10/09/2021   Recurrent UTI 10/09/2021   Quadriplegia, C5-C7 incomplete (HCC) 01/16/2021   History of spinal fracture 01/16/2021   Suprapubic catheter (HCC) 01/16/2021   Encounter for routine gynecological examination 09/28/2013   Onychomycosis 09/28/2013   Foot deformity, acquired 03/26/2012   Encounter for preventive health examination 12/25/2010   ROSACEA 08/25/2009   Disturbance in sleep behavior 03/11/2008   SKIN CANCER, HX OF 03/11/2008   DYSURIA, HX OF 03/11/2008   Hyperlipidemia 02/10/2007   CERVICALGIA 02/10/2007    Patient's Medications  New Prescriptions   No medications on file  Previous Medications   ASCORBIC ACID (VITAMIN C) 1000 MG TABLET    Take 1 tablet (1,000 mg total) by mouth in the morning, at noon, in the evening, and at bedtime.   ATORVASTATIN (LIPITOR) 20 MG TABLET    TAKE 1 TABLET BY MOUTH EVERY DAY   BACLOFEN (LIORESAL) 20 MG TABLET    TAKE 1&1/2 TABLETS BY MOUTH IN THE MORNING,EVERNING AND AT BEDTIME EVERY DAY   BISACODYL (DULCOLAX) 10 MG SUPPOSITORY    Place 10 mg rectally as needed for moderate constipation. Insert one suppository per rectum with each bowel program procedure.   CHOLECALCIFEROL (VITAMIN D3) 25 MCG (1000 UNIT) TABLET    Take 1,000 Units by mouth daily. 2000u   [employer] COENZYME Q-10 100 MG CAPSULE    Take 100 mg by mouth daily.    DOCUSATE SODIUM (DSS) 100 MG CAPS    Take by mouth.   DOXYCYCLINE (VIBRAMYCIN) 100 MG CAPSULE    Take 100 mg by mouth 2 (two) times daily.   FAMOTIDINE (PEPCID) 20 MG TABLET    Take 20 mg by mouth 2 (two) times daily.    FESOTERODINE (TOVIAZ) 8 MG TB24 TABLET    Take 1 tablet (8 mg total) by mouth daily.   GABAPENTIN (NEURONTIN) 600 MG TABLET    Take 2 tablets (1,200 mg total) by mouth 3 (three) times daily.   METHENAMINE (HIPREX) 1 G TABLET    Take 1 tablet (1 g total) by mouth 2 (two) times daily with a meal.   METRONIDAZOLE (METROGEL) 1 % GEL    APPLY TOPICALLY EVERY DAY   MIRABEGRON ER (MYRBETRIQ) 25 MG TB24 TABLET    Take 50 mg by mouth.   MULTIPLE VITAMINS-MINERALS (CENTRUM SILVER ULTRA WOMENS PO)    Take by mouth.   MUPIROCIN OINTMENT (BACTROBAN) 2 %    Apply 1 Application topically 2 (two) times daily.   NAPROXEN SODIUM (ALEVE) 220 MG TABLET    Take 220 mg by mouth. Tablet p.o per package directions as needed for pain   NORTRIPTYLINE (PAMELOR) 25 MG CAPSULE    TAKE 2 CAPSULES BY MOUTH DAILY.   SANTYL 250 UNIT/GM OINTMENT       SENNA CO    by Combination route. Sennosides 8.6mg  tab p.o per package directions daily as needed to promote bowel movement.   UNABLE TO FIND    Med Name: Saline Enema Per rectum per package directions as needed for relief of constipation.   ZOLPIDEM (AMBIEN) 10 MG TABLET    TAKE 1/2-1 TABLET BY MOUTH AT  BEDTIME AS NEEDED FOR SLEEP  Modified Medications   No medications on file  Discontinued Medications   No medications on file    Subjective: 72 YO Female with h/o Incomplete quadriplegia with neurogenic bladder s/p SPC with urinary incontinence who is here for fu in the setting of recurrent UTI. 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 Denies any UTIs so far. She has an aide who helps with [facility] exchange and follows up with Urology as needed.   Of note, she has been starting to follow up with wound care regarding left buttock wound that developed approx 5 months ago per wound care  notes when she was being transferred and hit a metal hinge. Seen by wound care, 4/29 visit " there is a punched-out  circular wound with increased depth but does not probe to bone. Granulation tissue present along with nonviable tissue. No signs of surrounding infection including increased warmth, erythema or purulent drainage. Rolled edges circumferentially."  She has a wound cx done on 5/20 that is growing GNR and was prescribed doxycycline yesterday. They report the cx was done from the discharge that came from inside but was told it does not go down to bone but has tunneling. Next fu with wound care is Monday. Aide she had completed a short course of augmentin approx a month ago for the buttock wound.   Review of Systems: all systems reviewed with pertinent positives and negatives as listed above.    Past Medical History:  Diagnosis Date   CERVICAL POLYP 03/11/2008   Qualifier: Diagnosis of  By: [name] MD, [name]    Colon polyps 2005   on colonscopy [name]   Fibroid 2004   Per Dr. [name]   History of shingles    face and mouth   Hx of skin cancer, basal cell    Rosacea    Sciatica of left side 09/28/2013   Scoliosis    noted on mri done for back pain   Past Surgical History:  Procedure Laterality Date   BUNIONECTOMY      Social History   Tobacco Use   Smoking status: Never   Smokeless tobacco: Never  Vaping Use   Vaping Use: Never used  Substance Use Topics   Alcohol use: Not Currently    Alcohol/week: 7.0 standard drinks of alcohol    Types: 7 Glasses of wine per week   Drug use: Yes    Comment: wine at night    Family History  Problem Relation Age of Onset   Hypertension Father    Osteoporosis Other    Breast cancer Neg Hx     No Known Allergies  Health Maintenance  Topic Date Due   COVID-19 Vaccine (7 - 2023-24 season) 11/24/2022   INFLUENZA VACCINE  01/13/2023   Medicare Annual Wellness (AWV)  03/03/2023   MAMMOGRAM  10/28/2023   Fecal DNA (Cologuard)  10/11/2024   DTaP/Tdap/Td (3 - Td or Tdap) 04/17/2032   Pneumonia Vaccine 40+ Years old  Completed   DEXA SCAN   Completed   Hepatitis C Screening  Completed   Zoster Vaccines- Shingrix  Completed   HPV VACCINES  Aged Out   COLONOSCOPY (Pts 45-94yrs Insurance coverage will need to be confirmed)  Discontinued    Objective: BP (!) 93/52   Pulse 76   Temp 97.9 F (36.6 C) (Oral)   SpO2 95%    Physical Exam  Constitutional:      Appearance: Normal appearance.  HENT:     Head: Normocephalic and atraumatic.      Mouth: Mucous membranes are moist.  Eyes:    Conjunctiva/sclera: Conjunctivae normal.     Pupils:  Cardiovascular:     Rate and Rhythm: Normal rate and regular rhythm.     Heart sounds:  Pulmonary:     Effort: Pulmonary effort is normal on RA    Breath sounds:   Abdominal:     General: Non distended     Palpations: soft. SPC with a urobag - OK with no concerns   Musculoskeletal:        General: sitting in a wheel chair   Skin:    General: Skin is warm and dry.     Comments: Left buttock wound with tunneling , no drainage or surrounding cellulitis, discharge, crepitus or fluctuance   Neurological:     General: quadriplegia     Mental Status: awake, alert and oriented to person, place, and time.   Psychiatric:        Mood and Affect: Mood normal.   Lab Results Lab Results  Component Value Date   WBC 3.6 (L) 10/14/2022   HGB 13.8 10/14/2022   HCT 40.5 10/14/2022   MCV 94.1 10/14/2022   PLT 215.0 10/14/2022    Lab Results  Component Value Date   CREATININE 0.40 10/14/2022   BUN 26 (H) 10/14/2022   NA 141 10/14/2022   K 4.6 10/14/2022   CL 102 10/14/2022   CO2 31 10/14/2022    Lab Results  Component Value Date   ALT 37 (H) 10/14/2022   AST 32 10/14/2022   ALKPHOS 68 10/14/2022   BILITOT 0.4 10/14/2022    Lab Results  Component Value Date   CHOL 136 10/14/2022   HDL 53.80 10/14/2022   LDLCALC 70 10/14/2022   LDLDIRECT 162.3 03/21/2012   TRIG 64.0 10/14/2022   CHOLHDL 3 10/14/2022   No results found for: "LABRPR", "RPRTITER" No results found for:  "HIV1RNAQUANT", "HIV1RNAVL", "CD4TABS"   Assessment/Plan # Recurrent UTI - continue methenamine and vitamin C as is. No UTIs since 10/09/2021 when she started taking methenamine and vit C - Medications refilled - CMP today ( 5/2 CMP ALT 37 but no abdominal complaints) - Fu in 5 months    # Incomplete Quadriplegia  # Neurogenic Bladder s/p SPC # Urinary Incontinence On Fesoterodine and Mirabegron Follows Urology   # Left buttock wound  - Following wound care, last seen 4/29 and not tunneling down to bone  - Wound cx 5/20 GNR, fu ID  - DC doxycycline and start po augmentin bid for 14 days  - Would defer wound care management to wound care currently and have discussed with patient to have a follow up scheduled with [place] if needed in future if any concerns/osteomyelitis    I have personally spent 45 minutes involved in face-to-face and non-face-to-face activities for this patient on the day of the visit. Professional time spent includes the following activities: Preparing to see the patient (review of tests), Obtaining and/or reviewing separately obtained history (admission/discharge record), Performing a medically appropriate examination and/or evaluation , Ordering medications/tests/procedures, referring and communicating with other health care professionals, Documenting clinical information in the EMR, Independently interpreting results (not separately reported), Communicating results to the patient/family/caregiver, Counseling and educating the patient/family/caregiver and Care coordination (not separately reported).   Victoriano Lain, MD [facility] 11/02/2022, 10:23  AM

## 2022-11-03 ENCOUNTER — Encounter: Payer: Self-pay | Admitting: Physical Therapy

## 2022-11-03 ENCOUNTER — Ambulatory Visit: Payer: Medicare PPO | Admitting: Occupational Therapy

## 2022-11-03 ENCOUNTER — Ambulatory Visit: Payer: Medicare PPO | Admitting: Physical Therapy

## 2022-11-03 ENCOUNTER — Encounter: Payer: Self-pay | Admitting: Infectious Diseases

## 2022-11-03 DIAGNOSIS — M6281 Muscle weakness (generalized): Secondary | ICD-10-CM

## 2022-11-03 DIAGNOSIS — G8253 Quadriplegia, C5-C7 complete: Secondary | ICD-10-CM | POA: Diagnosis not present

## 2022-11-03 DIAGNOSIS — G8254 Quadriplegia, C5-C7 incomplete: Secondary | ICD-10-CM | POA: Diagnosis not present

## 2022-11-03 DIAGNOSIS — R278 Other lack of coordination: Secondary | ICD-10-CM

## 2022-11-03 DIAGNOSIS — M24541 Contracture, right hand: Secondary | ICD-10-CM

## 2022-11-03 DIAGNOSIS — R293 Abnormal posture: Secondary | ICD-10-CM

## 2022-11-03 DIAGNOSIS — M24542 Contracture, left hand: Secondary | ICD-10-CM

## 2022-11-03 DIAGNOSIS — R29818 Other symptoms and signs involving the nervous system: Secondary | ICD-10-CM | POA: Diagnosis not present

## 2022-11-03 DIAGNOSIS — R29898 Other symptoms and signs involving the musculoskeletal system: Secondary | ICD-10-CM | POA: Diagnosis not present

## 2022-11-03 LAB — COMPREHENSIVE METABOLIC PANEL
AG Ratio: 1.6 (calc) (ref 1.0–2.5)
ALT: 29 U/L (ref 6–29)
AST: 25 U/L (ref 10–35)
Albumin: 3.9 g/dL (ref 3.6–5.1)
Alkaline phosphatase (APISO): 75 U/L (ref 37–153)
BUN/Creatinine Ratio: 78 (calc) — ABNORMAL HIGH (ref 6–22)
BUN: 28 mg/dL — ABNORMAL HIGH (ref 7–25)
CO2: 33 mmol/L — ABNORMAL HIGH (ref 20–32)
Calcium: 9.3 mg/dL (ref 8.6–10.4)
Chloride: 99 mmol/L (ref 98–110)
Creat: 0.36 mg/dL — ABNORMAL LOW (ref 0.60–1.00)
Globulin: 2.5 g/dL (calc) (ref 1.9–3.7)
Glucose, Bld: 83 mg/dL (ref 65–99)
Potassium: 4.1 mmol/L (ref 3.5–5.3)
Sodium: 138 mmol/L (ref 135–146)
Total Bilirubin: 0.3 mg/dL (ref 0.2–1.2)
Total Protein: 6.4 g/dL (ref 6.1–8.1)

## 2022-11-03 LAB — CBC
MCH: 31.1 pg (ref 27.0–33.0)
MCHC: 32.9 g/dL (ref 32.0–36.0)
MCV: 94.6 fL (ref 80.0–100.0)
MPV: 10.3 fL (ref 7.5–12.5)
Platelets: 235 10*3/uL (ref 140–400)
RDW: 12 % (ref 11.0–15.0)

## 2022-11-03 LAB — C-REACTIVE PROTEIN: CRP: 12.2 mg/L — ABNORMAL HIGH (ref <8.0)

## 2022-11-03 NOTE — Therapy (Signed)
OUTPATIENT PHYSICAL THERAPY NEURO TREATMENT   Patient Name: [name] MRN: [id number] DOB:1950-11-02, 72 y.o., female Today's Date: 11/05/2022   PCP: [name], MD REFERRING PROVIDER: [name], MD  END OF SESSION:    11/03/22 1103  PT Visits / Re-Eval  Visit Number 6  Number of Visits 17 (16 + eval)  Date for PT Re-Evaluation 12/10/22  Authorization  Authorization Type [employer]IC[employer]  Progress Note Due on Visit 10  PT Time Calculation  PT Start Time 1103  PT Stop Time 1146  PT Time Calculation (min) 43 min  PT - End of Session  Equipment Utilized During Treatment Gait belt  Activity Tolerance Patient tolerated treatment well  Behavior During Therapy WFL for tasks assessed/performed       Past Medical History:  Diagnosis Date   CERVICAL POLYP 03/11/2008   Qualifier: Diagnosis of  By: [name] MD, [name]    Colon polyps 2005   on colonscopy [name]   Fibroid 2004   Per Dr. [name]   History of shingles    face and mouth   Hx of skin cancer, basal cell    Rosacea    Sciatica of left side 09/28/2013   Scoliosis    noted on mri done for back pain   Past Surgical History:  Procedure Laterality Date   BUNIONECTOMY     Patient Active Problem List   Diagnosis Date Noted   Buttock wound, left, initial encounter 11/02/2022   Orthostatic hypotension 08/13/2022   Neurogenic bowel 05/03/2022   Spasticity 05/03/2022   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/03/2022   Nerve pain 05/03/2022   Medication monitoring encounter 01/08/2022   Neurogenic bladder 10/11/2021   Urinary incontinence 10/11/2021   ESBL (extended spectrum beta-lactamase) producing bacteria infection 10/09/2021   Recurrent UTI 10/09/2021   Quadriplegia, C5-C7 incomplete (HCC) 01/16/2021   History of spinal fracture 01/16/2021   Suprapubic catheter (HCC) 01/16/2021   Encounter for routine gynecological examination 09/28/2013   Onychomycosis 09/28/2013   Foot deformity, acquired  03/26/2012   Encounter for preventive health examination 12/25/2010   ROSACEA 08/25/2009   Disturbance in sleep behavior 03/11/2008   SKIN CANCER, HX OF 03/11/2008   DYSURIA, HX OF 03/11/2008   Hyperlipidemia 02/10/2007   CERVICALGIA 02/10/2007    ONSET DATE: 09/28/2022 (most recent referral)  REFERRING DIAG: G82.54 (ICD-10-CM) - Quadriplegia, C5-C7, incomplete (HCC)  THERAPY DIAG:  Muscle weakness (generalized)  Other lack of coordination  Abnormal posture  Rationale for Evaluation and Treatment: Rehabilitation  SUBJECTIVE:  SUBJECTIVE STATEMENT: Pt with some arm soreness after yesterday's therapy sessions, no other complaints.  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 Yes: NPRS scale: 3/10 Pain location: forearms to fingertips Pain description: constant, pinprick/tingling Aggravating factors: nighttime Relieving factors: nothing, sometimes medicines  PRECAUTIONS: Fall; suprapubic catheter (she wants to get this removed meaning she needs to get to and from the toilet); she has had minor heat sensation when needing to complete her bowel program-possible AD?  WEIGHT BEARING RESTRICTIONS: No-pt was in stander at most recent therapy in [place] last week.  She will have a bone density done soon w/ Dr. [name].  FALLS: Has patient fallen in last 6 months? No  LIVING ENVIRONMENT: Lives with: lives with their spouse and live-in nurse [name] Lives in: House/apartment-townhouse Stairs: No-level entry, but multi-level home 4 stories w/ elevator Has following equipment at home: Wheelchair (power), Wheelchair (manual), Grab bars, and standing frame, sliding board,  transport shower chair, handheld shower head, leg strap-pt reports she no longer finds this helpful  PLOF: Requires assistive device for independence, Needs assistance with ADLs, Needs assistance with homemaking, and Needs assistance with transfers  OCCUPATION:  Writer-uses Dragon to dictate  PATIENT GOALS: "Make my right leg work."  Stand and pivot so she can more easily access a toilet.  OBJECTIVE:   DIAGNOSTIC FINDINGS: No recent relevant imaging.  Bone density scheduled 10/15/2022.  COGNITION: Overall cognitive status: Within functional limits for tasks assessed   SENSATION: Light touch: Diminished ability to distinguish sharp and dull, but able to distinguish light touch from injury level down accurately  COORDINATION: Not formally assessed.  EDEMA:  Well managed w/ lymphatic massage and compression stockings.  MUSCLE TONE: Pt has intermittent clonus during transfers.  POSTURE: rounded shoulders, posterior pelvic tilt, and right thoracic scoliosis   LOWER EXTREMITY ROM:     Passive  Right 10/20/22 Left 10/20/22  Hip flexion [facility]  Hip extension    Hip abduction    Hip adduction    Hip internal rotation [facility] WFL  Hip external rotation [facility]  Knee flexion [facility] WFL  Knee extension [facility] WFL  Ankle dorsiflexion Slight PF contracture Slight PF contracture  Ankle plantarflexion    Ankle inversion    Ankle eversion     (Blank rows = not tested)    LOWER EXTREMITY MMT:    MMT Right 10/20/22 Left 10/20/22  Hip flexion 1 2+  Hip extension    Hip abduction    Hip adduction    Hip internal rotation    Hip external rotation    Knee flexion 2- 3  Knee extension 2- 3  Ankle dorsiflexion 0 3  Ankle plantarflexion    Ankle inversion    Ankle eversion     (Blank rows = not tested)   BED MOBILITY:  Sit to supine Mod A Supine to sit Mod A Rolling to Right Mod A Rolling to Left Mod A Undulating mattress for wound management on standard bed (elevated-so often doing  uphill sliding board transfers); she would like to continue working on sitting up independently, she has been working on rolling, needs less assistance w/ this when someone props her leg into hooklying; would like something to help her pull her left leg to her butt for stretching as well as bed mobility.  TRANSFERS: Pt performs lateral scoot over to right  then left wc <> mat minA for foot positioning only, pt is on bladder training so catheter clamped off during transfer but with therapist to maintain proximity.  Pt able to intermittently PF/push w/ LLE when positioned in mechanically advantageous position by therapist.    FUNCTIONAL TESTS:  None relevant to pt's current functional level and abilities.  PATIENT SURVEYS:  None completed due to time.  TODAY'S TREATMENT:       TherAct Pt received seated in PWC with her aide [name] present. [name] hands-on and assisting pt with transfers during session. Lateral scoot transfer PWC to mat table to left with min A needed for some repositioning of her BLE.  Repeated x4 lateral scoot/bump transfer left (uphill) and then right (downhill) progressing to 23" height to work towards pt goal of higher transfers.  On third lateral left transfer at highest height and due to fatigue pt required yoga block placement for pushoff midway through in order to obtain enough bottom clearance.  Pt fatigued following this practice so transitioned to prone modA for UE, trunk, and LE placement (until point of leverage for improved pt engagement to getting into prone).  In prone PT provides quad stretch 3x45 seconds each LE.  Continued with IR/ER/DF stretch x30 seconds each, pt being less tight with these.  Pt independent returning to supine w/ PT managing catheter bag.   She bridges x4 to return to EOM prior to sitting up w/ min-modA at hips and to place LE into hooklying.  PT provides modA for trunk assistance into sitting.  Slide board performed to right due to fatigue at end of  session w/ caregiver and pt.  PATIENT EDUCATION: Education details: Purpose of practicing elevated height bump transfers. Person educated: Patient and Arts administrator Education method: Explanation Education comprehension: verbalized understanding  HOME EXERCISE PROGRAM: Will be established as needed as pt has done continuous therapy and is working towards functional tasks.  GOALS: Goals reviewed with patient? Yes  SHORT TERM GOALS: Target date: 11/12/2022  HEP to be established for stretching and strengthening as needed. Baseline:  To be established. Goal status: INITIAL  2.  Pt will be able to perform rolling L and R w/ no more than minA in order to improve functional mobility. Baseline: modA Goal status: INITIAL  3.  Pt will perform sit<>supine requiring no more than minA in order to improve functional mobility. Baseline: modA Goal status: INITIAL  4.  Pt will perform bilateral reach outside seated BOS with feet supported without LOB in order to improve safety with ADL performance. Baseline: lateral LOB bilaterally demonstrated in w/c Goal status: INITIAL  LONG TERM GOALS: Target date: 12/10/2022  Pt will perform squat pivot transfer w/ no more than modA in order to improve access to home environment for toilet transfers. Baseline: minA for lateral scoot transfer, pt able to clear bottom intermittently Goal status: INITIAL  2.  Pt will perform sit<>supine requiring no more than supervision in order to improve transfers in and out of bed. Baseline:  modA Goal status: INITIAL  3.  Pt will report wound healing in order to return to day program at [facility]. Baseline:  Ischial wound Goal status: INITIAL  4.  Pt will be able to perform rolling L and R w/ no more than supervision in order to improve functional mobility. Baseline: modA Goal status: INITIAL  5.  Pt will perform uphill sliding board transfer w/ no more than modA in order to improve transfer in home  environment. Baseline:  To be assessed. Goal status: INITIAL  6.  Pt will perform partial STS in stander or from elevated power w/c to counter w/ no more than modA in order to improve capacity for higher level transfers and access to uneven surfaces. Baseline: To be assessed. Goal status: INITIAL  ASSESSMENT:  CLINICAL IMPRESSION: Focus of skilled PT session today on improving effectiveness and LE engagement to lateral transfers with elevated height.  Patient's max tolerated height today was 23" and she demonstrates good LLE push throughout.  Her quads were noticeably more tight during passive stretching this visit indicating good active engagement to prior tasks.  She continues to benefit from skilled PT to address deficits in mobility to progress independence in home setting and quality of life.  OBJECTIVE IMPAIRMENTS: decreased balance, decreased coordination, decreased mobility, difficulty walking, decreased ROM, decreased strength, hypomobility, increased edema, impaired flexibility, impaired sensation, impaired tone, impaired UE functional use, improper body mechanics, postural dysfunction, and pain.   ACTIVITY LIMITATIONS: carrying, lifting, bending, sitting, standing, squatting, stairs, transfers, bed mobility, continence, bathing, toileting, dressing, reach over head, hygiene/grooming, locomotion level, and caring for others  PARTICIPATION LIMITATIONS: meal prep, cleaning, laundry, driving, and community activity  PERSONAL FACTORS: Age, Fitness, Past/current experiences, Time since onset of injury/illness/exacerbation, and 1-2 comorbidities: intermittent AD, neurogenic bowel/bladder on active bladder training  are also affecting patient's functional outcome.   REHAB POTENTIAL: Good  CLINICAL DECISION MAKING: Evolving/moderate complexity  EVALUATION COMPLEXITY: Moderate  PLAN:  PT FREQUENCY: 2x/week  PT DURATION: 8 weeks  PLANNED INTERVENTIONS: Therapeutic exercises,  Therapeutic activity, Neuromuscular re-education, Balance training, Patient/Family education, Self Care, DME instructions, Electrical stimulation, Wheelchair mobility training, Manual therapy, and Re-evaluation  PLAN FOR NEXT SESSION:  STANDER!  Further assess and address multilevel slide board transfers.  Education on pressure relief for wound healing.  Core strength, static sitting balance-perturbations.  Bed mobility.  Use stander to practice weight-bearing and core as tolerated--monitor BP due to history of OH, can work towards decreased sling support in standing, use of powderboard on mat table for RLE strengthening, wants to work on increasing independence with lateral scoot transfers without SB and also wants to be able to place SB independently and move her LE more independently  Sadie Haber, PT, DPT 11/05/2022, 8:02 AM

## 2022-11-03 NOTE — Therapy (Signed)
OUTPATIENT OCCUPATIONAL THERAPY NEURO TREATMENT  Patient Name: [name] MRN: [id number] DOB:28-Jan-1951, 72 y.o., female Today's Date: 11/03/2022  PCP: [name], MD  REFERRING PROVIDER: [name], MD  END OF SESSION:  OT End of Session - 11/03/22 0932     Visit Number 6    Number of Visits 17    Date for OT Re-Evaluation 12/10/22    Authorization Type [employer] - requires auth, [place]    Progress Note Due on Visit 10    OT Start Time 0930    OT Stop Time 1015    OT Time Calculation (min) 45 min    Equipment Utilized During Treatment Patient in Power WC, splint/heat gun    Activity Tolerance Patient tolerated treatment well    Behavior During Therapy [facility] for tasks assessed/performed             Past Medical History:  Diagnosis Date   CERVICAL POLYP 03/11/2008   Qualifier: Diagnosis of  By: [name] MD, [name]    Colon polyps 2005   on colonscopy [name]   Fibroid 2004   Per Dr. [name]   History of shingles    face and mouth   Hx of skin cancer, basal cell    Rosacea    Sciatica of left side 09/28/2013   Scoliosis    noted on mri done for back pain   Past Surgical History:  Procedure Laterality Date   BUNIONECTOMY     Patient Active Problem List   Diagnosis Date Noted   Buttock wound, left, initial encounter 11/02/2022   Orthostatic hypotension 08/13/2022   Neurogenic bowel 05/03/2022   Spasticity 05/03/2022   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/03/2022   Nerve pain 05/03/2022   Medication monitoring encounter 01/08/2022   Neurogenic bladder 10/11/2021   Urinary incontinence 10/11/2021   ESBL (extended spectrum beta-lactamase) producing bacteria infection 10/09/2021   Recurrent UTI 10/09/2021   Quadriplegia, C5-C7 incomplete (HCC) 01/16/2021   History of spinal fracture 01/16/2021   Suprapubic catheter (HCC) 01/16/2021   Encounter for routine gynecological examination 09/28/2013   Onychomycosis 09/28/2013   Foot deformity, acquired  03/26/2012   Encounter for preventive health examination 12/25/2010   ROSACEA 08/25/2009   Disturbance in sleep behavior 03/11/2008   SKIN CANCER, HX OF 03/11/2008   DYSURIA, HX OF 03/11/2008   Hyperlipidemia 02/10/2007   CERVICALGIA 02/10/2007    ONSET DATE: 07/28/2020  Date of Referral 09/28/22  REFERRING DIAG: G82.54 (ICD-10-CM) - Quadriplegia, C5-C7, incomplete (HCC)  THERAPY DIAG:  Muscle weakness (generalized)  Other lack of coordination  Contracture of hand joint, left  Contracture of hand joint, right  Rationale for Evaluation and Treatment: Rehabilitation  SUBJECTIVE:   SUBJECTIVE STATEMENT: Patient reports wound vac was supposed to be fitted Monday at wound appt but an infection in wound was found and initially doxycycline was prescribed but changed to augmentin s/p ID appt yesterday.  She will return to the wound doctor next Monday for her weekly visit.  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  Reports receiving neck pillows but they were a little awkward with trials for pressure relief of wound in the bed.    Pt accompanied by:  Live in Caregiver - [name]   PERTINENT HISTORY: "Pt is a 72 yr old L handed female with hx of incomplete quadriplegia- 2/14 2022- fleeing  the police in [place] on passenger 100 (high speed) miles/hour,  [name]; neurogenic bowel and bladder and spasticity; [name], [name] Here for f/u on Incomplete quadriplegia"  Referral from MD 09/28/22 states, "Please eval and treat for ADLs and higher level mobility."  PRECAUTIONS: Fall; suprapubic catheter (she wants to get this removed meaning she needs to get to and from the toilet); she has had minor heat sensation when needing to complete her bowel program-possible AD?   WEIGHT BEARING RESTRICTIONS: No-pt was in stander at  most recent therapy in [place] last week.  She will have a bone density done soon w/ Dr. [name].   PAIN:  Patient reports chronic pain 3/10 fingers to elbow bilaterally managed by medication and will inform therapy staff of any changes.  FALLS: Has patient fallen in last 6 months? No  LIVING ENVIRONMENT: Lives with: lives with their family - husband [name] and with an adult companion s/p moving back up from [place] x10 months Lives in: House/apartment Stairs:  4 story town house with an Engineer, structural with threshold adjustments, roll in shower with transport chair Has following equipment at home: Wheelchair (power) - with seat height adjustments to access counters and reclining option, Wheelchair (manual), transport WC, shower chair, and Ramped entry, handheld showerhead with rails around toilet, had Michiel Sites but is no longer in need of it, has slide boards x3  PLOF: Requires assistive device for independence, Needs assistance with ADLs, Needs assistance with homemaking, Needs assistance with gait, and Needs assistance with transfers; 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  Used to like to knit, sew and bake.  PATIENT GOALS: Wants to be able to type - currently using advanced Dragon dictation at times but prefers to type at times.  She would like to be able to write better and is interested in resuming some leisure activities such as Archivist and baking.  She also wants to keep working on being able to cut her own food and on her UE strength.   OBJECTIVE:   HAND DOMINANCE: Left  ADLs: Overall ADLs: Patient has a live in caregiver  Transfers/ambulation related to ADLs: Mod assist with sliding board transfers (previously [employer] lift).  Eating: Has a rocker knife that she can use. Used to use adapted utensils but now uses regular utensils but still will get assistance to cut food ie) when eating out.  Grooming: can brush her own hair but unable to manage jewelry ie) earrings  UB  Dressing: can zip/unzip after it has been started, unable to manage buttons herself, Caregiver assists but if she has extra time, she can put on her bra, and a loose fitting pullover shirt/t-shirt  LB Dressing: dependent for LB dressing in bed and with special sock donner for LE compression garments   Toileting: bladder trained with suprapubic catheter which she clamps off.  Dependent for bowel incontinence care.  Bathing: Sponge bath with adult washclothes.  Can bath UB with back scrubber for most of her back.  Needs help with feet (mentioned she might need a separate brush for feet)   Tub Shower transfers: Min-mod assist with slide board  Equipment: Shower seat with back, Walk in shower, bed side commode, Reacher, Sock aid, Long handled sponge, and Feeding equipment  IADLs: --  Shopping: Assisted by caregiver  Light housekeeping: Has housekeeper that comes monthly  Meal Prep: previously enjoyed baking. Assisted by caregiver but described recent success at reheating a meal for herself after  getting food out of the fridge/freezer from her WC.  Community mobility: Dependent  Medication management: Caregiver sorts them into pillbox but she is very aware of her medications   Financial management: Patient manages her own finances  Handwriting: Increased time and has a pen with a little grip  MOBILITY STATUS: Independent with power mobiity  POSTURE COMMENTS:  No Significant postural limitations and forward head Sitting balance: Supports self independently with both Ues  ACTIVITY TOLERANCE: Activity tolerance: Fair - MMT WFL but has limited sustained tolerance for ongoing use of UEs  FUNCTIONAL OUTCOME MEASURES: 10/20/22 QuickDash 31.8 points   UPPER EXTREMITY ROM:   AROM - WFL without obvious contractures, some digital flexion noted but PROM WNL   AROM Right (eval) Left (eval)  Shoulder flexion [facility]  Shoulder abduction [facility]  Elbow flexion [facility] WFL  Elbow extension [facility]  Wrist flexion [facility] WFL  Wrist extension WFL WFL  Ulnar deviation WFL Decreased ulnar  deviation past midline  Wrist pronation [facility] WFL  Wrist supination [facility]  Digit Composite Flexion Lacks full AROM:   1st digit - 5 cm   3rd digit -1 cm   4th digit - 2 cm  Lacks full AROM:   1st digit - 1.5 cm  5th digit - 3 cm   Digit Composite Extension [facility]  Digit Opposition Opposition to index finger only Lacks to pinkie due to limited DIP pinkie flexion  (Blank rows = not tested)  UPPER EXTREMITY MMT:   Grossly WFL - Endurance limited R tricep strength > than L but L UE generally stronger than R UE  MMT Right (eval) Left (eval)  Shoulder flexion 4/5 4/5  Shoulder abduction 4/5 4/5  Elbow flexion 4/5 4/5  Elbow extension 4/5 4-/5  (Blank rows = not tested)  HAND FUNCTION: Grip strength: Right: 4.8 lbs; Left: 20.9 lbs  COORDINATION: Finger Nose Finger test: R generally WFL, L WNL Box and Blocks:  Right 28 blocks, Left 37 blocks R hand finger eventually cramps and dexterity worsens in the cold  SENSATION: Light touch: Impaired  - patient   EDEMA: NA for UEs but LE has poor lymph drainage with custom compression garments   MUSCLE TONE: Generally WFL but will assess further  COGNITION: Overall cognitive status: Within functional limits for tasks assessed  VISION: Subjective report: Patent wears progressive lens/glasses.  Denies diplopia or vision changes but has eye exam in the next couple of months. Baseline vision: Wears glasses all the time  VISION ASSESSMENT: WFL  OBSERVATIONS: Patient independent with power WC navigation within clinic.  Patient is well-kept with foley catheter in place.  She has slight limitations in full extension of digits but PROM is WNL and she has splints at home that she said she will bring for OT staff to assess.  She has functional ROM of B UEs to reach her head, behind her back and to cross midline to assist with ADLs.  She had fairly     TODAY'S TREATMENT:  Therapeutic Activities  Reviewed positioning ideas with neck pillow with possible use of infant neck pillow to float the area for different durations throughout her time in the bed, if the adult pillow is too big/uncomfortable .  Patient engaged in simulated sewing activities with large plastic needle and string for beads and then sewing a running stitch through shelf liner holes.  She was also given a finger tip silicone protector to trial for increased grip with no significant improvement reported but ongoing use throughout tasks.  She had some difficulty pulling large needle through some of the narrower holes in pegs and transitioned to "sewing" with shelf liner to simulate embroidery material.  She was frustrated after about 10 running stitches but had been able to change directions with thread and reposition material in her hand without help.  She reports she try to bring her knitting for a future session.   Patient brought her ipad/keyboard to assess finger movements for typing activities. She tends to use her L pinkie for touching her ipad screen, and one finger from each hand to touch letters for typing.  Attempts to use multiple fingers (with increased ability on L compared to R) her typing speed decreases.  She is encouraged to "practice" with other fingers but use her most efficient method with work tasks.  She did not always support forearms on WC arm rest and used some "arm movements" to help reach different letters/numbers on the keyboard.   Splinting/Orthotic  Patient mentioned issue with R fingers not being flat in her splints and B splints were checked.  R splint modified with some minor trimming along the ulnar edge of the finger pan to allow the straps to flatten her fingers in the finger pan better.  They had bought some moleskin and  modifications made to L thumb pan to minimize risk of pressure along inner IP joint of thumb.  PATIENT EDUCATION: Education details: Reviewed - Bed positioning and pressure relief; and updating FM tasks with penny activity completed today (no handouts given) Person educated: Patient and Caregiver Live in caregiver - [name] Education method: Explanation, Demonstration, Tactile cues, and Verbal cues Education comprehension: verbalized understanding, returned demonstration, and needs further education  HOME EXERCISE PROGRAM: 10/27/22 - Theraputty  Access Code: [email or web address] URL: [email or web address] Date: 10/27/2022 Prepared by: Amada Kingfisher   GOALS:   SHORT TERM GOALS: Target date: 11/09/22  Patient will be able to use AE/modified techniques to cut soft foods small enough for oral intake. Baseline: Caregiver/spouse assist Goal status: IN PROGRESS  2.  Patient will be able to use AE/modified positioning to complete word search by drawing lines through words with 0 errors. Baseline: TBD Goal status: INITIAL  3.  Patient will verbalize understanding of good pressure relief schedule (for 15 to 60 seconds every 15 to 60 minutes) to help with wound healing. Baseline: Patient did not change positioning in > 45 minutes of OT eval. Goal status: IN PROGRESS  4.  Patient will be assisted to explore modifications for leisure tasks (knit/sew/bake) with good return demonstration. Baseline: Minimal involvement Goal status: IN PROGRESS  5.  Patient will demonstrate independence with HEP for UE strengthening, coordination and ROM to prevent contractures and maintain strength for transfers and ADLs. Baseline: Previous HEPs have been established but need to be reviewed and updated.  Goal status: IN PROGRESS  6.  Patient will be assessed for typing speed/dexterity. Baseline: Patient reports difficulty with typing with all her fingers. Goal status: IN PROGRESS  7. Patient will  complete  Quick Dash UE assessment. Baseline: TBD Goal Status: 10/20/22 Discontinued - see scores above   LONG TERM GOALS: Target date: 12/10/22  Patient will complete 1 small craft/week r/t her leisure interests to work on FMS daily. Baseline: Minimal involvement Goal status: IN PROGRESS  2.  Patient will improve B coordination for increased typing speed/dexterity x 1-2 WPM. Baseline: TBD Goal status: IN PROGRESS  3.  Patient will improve B UE coordination, strength and functional use to bake cookies with setup assistance and AE/modified techniques as appropriate.  Baseline: Not performed. Goal status: IN PROGRESS  4.  Patient will report no more than moderate difficulty using a knife to foods such as chicken, small enough for oral intake using AE and strategies as needed. Baseline: Caregiver/spouse assist Goal status: IN PROGRESS  5.  Patient will be able to use AE/modified positioning to complete 4 sentences with 100% legibility. Baseline: Subjective reports of difficulty with writing Goal status: INITIAL   ASSESSMENT:  CLINICAL IMPRESSION: Patient seen today for occupational therapy today with attention to FM tasks for crafty activities and minor adjustments to splints for improved fit. Pt continues to benefit from ongoing skilled OT services in the outpatient setting to work on impairments in B UE ROM, strength and coordination to help pt return to highest level of independence with self care, work and leisure activities.     PERFORMANCE DEFICITS: in functional skills including ADLs, IADLs, coordination, dexterity, strength, muscle spasms, Fine motor control, Gross motor control, continence, skin integrity, and UE functional use,   IMPAIRMENTS: are limiting patient from ADLs, IADLs, work, and leisure.   CO-MORBIDITIES: has co-morbidities such as incontinence and wound  that affects occupational performance. Patient will benefit from skilled OT to address above impairments and improve  overall function.  REHAB POTENTIAL: Fair due to chronicity of injury   PLAN:  OT FREQUENCY: 2x/week  OT DURATION: 8 weeks  PLANNED INTERVENTIONS: self care/ADL training, therapeutic exercise, therapeutic activity, neuromuscular re-education, manual therapy, passive range of motion, balance training, functional mobility training, splinting, patient/family education, energy conservation, coping strategies training, and DME and/or AE instructions  RECOMMENDED OTHER SERVICES: Patient was seen for PT evaluation today with treatment plans coordinated for 2x/week.  CONSULTED AND AGREED WITH PLAN OF CARE: Patient and family member/caregiver  PLAN FOR NEXT SESSION:  Progress written ROM/coordination and strengthening HEP including putty.    Need to review OT goals for modifications and suggestions re: further activities also ie) Continue FM tasks to assess and improve typing and simple hobby/craft activities (ie baking, yarn activities).   As needed - will provide information re: prefab soft splints/orthotist option.   Victorino Sparrow, OT 11/03/2022, 4:04 PM

## 2022-11-04 NOTE — Therapy (Signed)
OUTPATIENT OCCUPATIONAL THERAPY NEURO TREATMENT  Patient Name: [name] MRN: [id number] DOB:09-05-1950, 72 y.o., female Today's Date: 11/05/2022  PCP: [name], MD  REFERRING PROVIDER: [name], MD  END OF SESSION:  OT End of Session - 11/05/22 1024     Visit Number 7    Number of Visits 17    Date for OT Re-Evaluation 12/10/22    Authorization Type [employer] - requires auth, [place]    Progress Note Due on Visit 10    OT Start Time 1024    OT Stop Time 1103    OT Time Calculation (min) 39 min    Equipment Utilized During Treatment Patient in Power WC, splint/heat gun    Activity Tolerance Patient tolerated treatment well    Behavior During Therapy [facility] for tasks assessed/performed              Past Medical History:  Diagnosis Date   CERVICAL POLYP 03/11/2008   Qualifier: Diagnosis of  By: [name] MD, [name]    Colon polyps 2005   on colonscopy [name]   Fibroid 2004   Per Dr. [name]   History of shingles    face and mouth   Hx of skin cancer, basal cell    Rosacea    Sciatica of left side 09/28/2013   Scoliosis    noted on mri done for back pain   Past Surgical History:  Procedure Laterality Date   BUNIONECTOMY     Patient Active Problem List   Diagnosis Date Noted   Buttock wound, left, initial encounter 11/02/2022   Orthostatic hypotension 08/13/2022   Neurogenic bowel 05/03/2022   Spasticity 05/03/2022   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/03/2022   Nerve pain 05/03/2022   Medication monitoring encounter 01/08/2022   Neurogenic bladder 10/11/2021   Urinary incontinence 10/11/2021   ESBL (extended spectrum beta-lactamase) producing bacteria infection 10/09/2021   Recurrent UTI 10/09/2021   Quadriplegia, C5-C7 incomplete (HCC) 01/16/2021   History of spinal fracture 01/16/2021   Suprapubic catheter (HCC) 01/16/2021   Encounter for routine gynecological examination 09/28/2013   Onychomycosis 09/28/2013   Foot deformity, acquired  03/26/2012   Encounter for preventive health examination 12/25/2010   ROSACEA 08/25/2009   Disturbance in sleep behavior 03/11/2008   SKIN CANCER, HX OF 03/11/2008   DYSURIA, HX OF 03/11/2008   Hyperlipidemia 02/10/2007   CERVICALGIA 02/10/2007    ONSET DATE: 07/28/2020  Date of Referral 09/28/22  REFERRING DIAG: G82.54 (ICD-10-CM) - Quadriplegia, C5-C7, incomplete (HCC)  THERAPY DIAG:  Muscle weakness (generalized)  Other lack of coordination  Abnormal posture  Contracture of hand joint, left  Contracture of hand joint, right  Quadriplegia, C5-C7 incomplete (HCC)  Other symptoms and signs involving the musculoskeletal system  Quadriplegia, C5-C7 complete (HCC)  Other symptoms and signs involving the nervous system  Other disturbances of skin sensation  Rationale for Evaluation and Treatment: Rehabilitation  SUBJECTIVE:   SUBJECTIVE STATEMENT: Patient reports she is little overwhelmed by all the exercises she has not just for occupational therapy about other disciplines on top of her daily responsibilities.  She feels that is not is impossible to complete her exercises as instructed.  She acknowledges she has not been utilizing putty.  She reports she has not noticed a big change in her hands since the start of therapy.  Pt accompanied by:  Live in Caregiver - [name]   PERTINENT HISTORY: "Pt is a 72 yr old L handed female with hx of incomplete quadriplegia- 2/14 2022-  fleeing the police in [place] on passenger 100 (high speed) miles/hour,  [name]; neurogenic bowel and bladder and spasticity; [name], [name] Here for f/u on Incomplete quadriplegia"  Referral from MD 09/28/22 states, "Please eval and treat for ADLs and higher level mobility."  PRECAUTIONS: Fall; suprapubic catheter (she wants to get this removed meaning she needs to get to and from the toilet); she has had minor heat sensation when needing to complete her bowel program-possible AD?    WEIGHT BEARING RESTRICTIONS: No-pt was in stander at most recent therapy in [place] last week.  She will have a bone density done soon w/ Dr. [name].   PAIN:  Patient reports chronic pain 3/10 fingers to elbow bilaterally managed by medication and will inform therapy staff of any changes.  FALLS: Has patient fallen in last 6 months? No  LIVING ENVIRONMENT: Lives with: lives with their family - husband [name] and with an adult companion s/p moving back up from [place] x10 months Lives in: House/apartment Stairs:  4 story town house with an Engineer, structural with threshold adjustments, roll in shower with transport chair Has following equipment at home: Wheelchair (power) - with seat height adjustments to access counters and reclining option, Wheelchair (manual), transport WC, shower chair, and Ramped entry, handheld showerhead with rails around toilet, had Michiel Sites but is no longer in need of it, has slide boards x3  PLOF: Requires assistive device for independence, Needs assistance with ADLs, Needs assistance with homemaking, Needs assistance with gait, and Needs assistance with transfers; 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  Used to like to knit, sew and bake.  PATIENT GOALS: Wants to be able to type - currently using advanced Dragon dictation at times but prefers to type at times.  She would like to be able to write better and is interested in resuming some leisure activities such as Archivist and baking.  She also wants to keep working on being able to cut her own food and on her UE strength.   OBJECTIVE:   HAND DOMINANCE: Left  ADLs: Overall ADLs: Patient has a live in caregiver  Transfers/ambulation related to ADLs: Mod assist with sliding board transfers (previously [employer] lift).  Eating: Has a rocker knife that she can use. Used to use adapted utensils but now uses regular utensils but still will get assistance to cut food ie) when eating out.  Grooming: can brush her own  hair but unable to manage jewelry ie) earrings  UB Dressing: can zip/unzip after it has been started, unable to manage buttons herself, Caregiver assists but if she has extra time, she can put on her bra, and a loose fitting pullover shirt/t-shirt  LB Dressing: dependent for LB dressing in bed and with special sock donner for LE compression garments   Toileting: bladder trained with suprapubic catheter which she clamps off.  Dependent for bowel incontinence care.  Bathing: Sponge bath with adult washclothes.  Can bath UB with back scrubber for most of her back.  Needs help with feet (mentioned she might need a separate brush for feet)   Tub Shower transfers: Min-mod assist with slide board  Equipment: Shower seat with back, Walk in shower, bed side commode, Reacher, Sock aid, Long handled sponge, and Feeding equipment  IADLs: --  Shopping: Assisted by caregiver  Light housekeeping: Has housekeeper that comes monthly  Meal Prep: previously enjoyed baking. Assisted by caregiver but described recent success at reheating a meal for herself  after getting food out of the fridge/freezer from her WC.  Community mobility: Dependent  Medication management: Caregiver sorts them into pillbox but she is very aware of her medications   Financial management: Patient manages her own finances  Handwriting: Increased time and has a pen with a little grip  MOBILITY STATUS: Independent with power mobiity  POSTURE COMMENTS:  No Significant postural limitations and forward head Sitting balance: Supports self independently with both Ues  ACTIVITY TOLERANCE: Activity tolerance: Fair - MMT WFL but has limited sustained tolerance for ongoing use of UEs  FUNCTIONAL OUTCOME MEASURES: 10/20/22 QuickDash 31.8 points   UPPER EXTREMITY ROM:   AROM - WFL without obvious contractures, some digital flexion noted but PROM WNL   AROM Right (eval) Left (eval)  Shoulder flexion [facility]  Shoulder abduction [facility]  Elbow flexion [facility] WFL  Elbow extension [facility]  Wrist flexion [facility] WFL  Wrist extension WFL WFL  Ulnar deviation WFL Decreased ulnar  deviation past midline  Wrist pronation [facility] WFL  Wrist supination [facility]  Digit Composite Flexion Lacks full AROM:   1st digit - 5 cm   3rd digit -1 cm   4th digit - 2 cm  Lacks full AROM:   1st digit - 1.5 cm  5th digit - 3 cm   Digit Composite Extension [facility]  Digit Opposition Opposition to index finger only Lacks to pinkie due to limited DIP pinkie flexion  (Blank rows = not tested)  UPPER EXTREMITY MMT:   Grossly WFL - Endurance limited R tricep strength > than L but L UE generally stronger than R UE  MMT Right (eval) Left (eval)  Shoulder flexion 4/5 4/5  Shoulder abduction 4/5 4/5  Elbow flexion 4/5 4/5  Elbow extension 4/5 4-/5  (Blank rows = not tested)  HAND FUNCTION: Grip strength: Right: 4.8 lbs; Left: 20.9 lbs  COORDINATION: Finger Nose Finger test: R generally WFL, L WNL Box and Blocks:  Right 28 blocks, Left 37 blocks R hand finger eventually cramps and dexterity worsens in the cold  SENSATION: Light touch: Impaired  - patient   EDEMA: NA for UEs but LE has poor lymph drainage with custom compression garments   MUSCLE TONE: Generally WFL but will assess further  COGNITION: Overall cognitive status: Within functional limits for tasks assessed  VISION: Subjective report: Patent wears progressive lens/glasses.  Denies diplopia or vision changes but has eye exam in the next couple of months. Baseline vision: Wears glasses all the time  VISION ASSESSMENT: WFL  OBSERVATIONS: Patient independent with power WC navigation within clinic.  Patient is well-kept with foley catheter in place.  She has slight limitations in full extension of digits but PROM is WNL and she has splints at home that she said she will bring for OT staff to assess.  She has functional ROM of B UEs to reach her head, behind her back and to  cross midline to assist with ADLs.  She had fairly    TODAY'S TREATMENT:                                                                                                                                  -  Therapeutic exercises completed for duration as noted below including: OT reprinted all HEP handouts organized, and provided to patient in 1 packet from this episode and previous therapy episode.  Reviewed all exercises with patient able to return demonstration with min to moderate cueing for proper execution to improve BUE ROM and strength.  PATIENT EDUCATION: Education details: Reviewed -BUE HEP  person educated: Patient and Caregiver Live in caregiver - [name] Education method: Explanation, Demonstration, Tactile cues, and Verbal cues, handouts Education comprehension: verbalized understanding, returned demonstration, and needs further education  HOME EXERCISE PROGRAM: 10/27/22 - Theraputty  Access Code: [email or web address] URL: [email or web address] Date: 10/27/2022 Prepared by: Amada Kingfisher   GOALS:   SHORT TERM GOALS: Target date: 11/09/22  Patient will be able to use AE/modified techniques to cut soft foods small enough for oral intake. Baseline: Caregiver/spouse assist Goal status: IN PROGRESS  2.  Patient will be able to use AE/modified positioning to complete word search by drawing lines through words with 0 errors. Baseline: TBD Goal status: INITIAL  3.  Patient will verbalize understanding of good pressure relief schedule (for 15 to 60 seconds every 15 to 60 minutes) to help with wound healing. Baseline: Patient did not change positioning in > 45 minutes of OT eval. Goal status: IN PROGRESS  4.  Patient will be assisted to explore modifications for leisure tasks (knit/sew/bake) with good return demonstration. Baseline: Minimal involvement Goal status: IN PROGRESS  5.  Patient will demonstrate independence with HEP for UE strengthening, coordination and ROM to  prevent contractures and maintain strength for transfers and ADLs. Baseline: Previous HEPs have been established but need to be reviewed and updated.  Goal status: IN PROGRESS  6.  Patient will be assessed for typing speed/dexterity. Baseline: Patient reports difficulty with typing with all her fingers. Goal status: IN PROGRESS  7. Patient will complete Quick Dash UE assessment. Baseline: TBD Goal Status: 10/20/22 Discontinued - see scores above   LONG TERM GOALS: Target date: 12/10/22  Patient will complete 1 small craft/week r/t her leisure interests to work on FMS daily. Baseline: Minimal involvement Goal status: IN PROGRESS  2.  Patient will improve B coordination for increased typing speed/dexterity x 1-2 WPM. Baseline: TBD Goal status: IN PROGRESS  3.  Patient will improve B UE coordination, strength and functional use to bake cookies with setup assistance and AE/modified techniques as appropriate.  Baseline: Not performed. Goal status: IN PROGRESS  4.  Patient will report no more than moderate difficulty using a knife to foods such as chicken, small enough for oral intake using AE and strategies as needed. Baseline: Caregiver/spouse assist Goal status: IN PROGRESS  5.  Patient will be able to use AE/modified positioning to complete 4 sentences with 100% legibility. Baseline: Subjective reports of difficulty with writing Goal status: INITIAL   ASSESSMENT:  CLINICAL IMPRESSION: Patient demonstrates sufficient understanding of HEP as needed for progression towards goals; however, she remains limited by lack of carryover outside of therapy clinic.  Patient would benefit from HEP calendar to ensure patient is completing all recommended exercises consistently.  PERFORMANCE DEFICITS: in functional skills including ADLs, IADLs, coordination, dexterity, strength, muscle spasms, Fine motor control, Gross motor control, continence, skin integrity, and UE functional use,    IMPAIRMENTS: are limiting patient from ADLs, IADLs, work, and leisure.   CO-MORBIDITIES: has co-morbidities such as incontinence and wound  that affects occupational performance. Patient will benefit from skilled OT to address above impairments and improve overall function.  REHAB POTENTIAL: Fair due  to chronicity of injury   PLAN:  OT FREQUENCY: 2x/week  OT DURATION: 8 weeks  PLANNED INTERVENTIONS: self care/ADL training, therapeutic exercise, therapeutic activity, neuromuscular re-education, manual therapy, passive range of motion, balance training, functional mobility training, splinting, patient/family education, energy conservation, coping strategies training, and DME and/or AE instructions  RECOMMENDED OTHER SERVICES: Patient was seen for PT evaluation today with treatment plans coordinated for 2x/week.  CONSULTED AND AGREED WITH PLAN OF CARE: Patient and family member/caregiver  PLAN FOR NEXT SESSION: Continue to review BUE HEP including administration of HEP calendar. Use (.pdexchart) for template  Need to review OT goals for modifications and suggestions re: further activities also ie) Continue FM tasks to assess and improve typing and simple hobby/craft activities (ie baking, yarn activities).   As needed - will provide information re: prefab soft splints/orthotist option.   Delana Meyer, OT 11/05/2022, 4:11 PM

## 2022-11-05 ENCOUNTER — Ambulatory Visit: Payer: Medicare PPO | Admitting: Occupational Therapy

## 2022-11-05 ENCOUNTER — Encounter: Payer: Self-pay | Admitting: Physical Therapy

## 2022-11-05 ENCOUNTER — Ambulatory Visit: Payer: Medicare PPO | Admitting: Physical Therapy

## 2022-11-05 DIAGNOSIS — R278 Other lack of coordination: Secondary | ICD-10-CM

## 2022-11-05 DIAGNOSIS — G8253 Quadriplegia, C5-C7 complete: Secondary | ICD-10-CM | POA: Diagnosis not present

## 2022-11-05 DIAGNOSIS — R293 Abnormal posture: Secondary | ICD-10-CM

## 2022-11-05 DIAGNOSIS — H353112 Nonexudative age-related macular degeneration, right eye, intermediate dry stage: Secondary | ICD-10-CM | POA: Diagnosis not present

## 2022-11-05 DIAGNOSIS — M6281 Muscle weakness (generalized): Secondary | ICD-10-CM | POA: Diagnosis not present

## 2022-11-05 DIAGNOSIS — M24541 Contracture, right hand: Secondary | ICD-10-CM | POA: Diagnosis not present

## 2022-11-05 DIAGNOSIS — M24542 Contracture, left hand: Secondary | ICD-10-CM | POA: Diagnosis not present

## 2022-11-05 DIAGNOSIS — R29898 Other symptoms and signs involving the musculoskeletal system: Secondary | ICD-10-CM

## 2022-11-05 DIAGNOSIS — R29818 Other symptoms and signs involving the nervous system: Secondary | ICD-10-CM

## 2022-11-05 DIAGNOSIS — G8254 Quadriplegia, C5-C7 incomplete: Secondary | ICD-10-CM

## 2022-11-05 DIAGNOSIS — R208 Other disturbances of skin sensation: Secondary | ICD-10-CM

## 2022-11-05 LAB — AEROBIC CULTURE W GRAM STAIN (SUPERFICIAL SPECIMEN)

## 2022-11-05 NOTE — Therapy (Unsigned)
OUTPATIENT PHYSICAL THERAPY NEURO TREATMENT   Patient Name: [name] MRN: [id number] DOB:May 14, 1951, 72 y.o., female Today's Date: 11/05/2022   PCP: [name], MD REFERRING PROVIDER: [name], MD  END OF SESSION:  PT End of Session - 11/05/22 0929     Visit Number 7    Number of Visits 17   16 + eval   Date for PT Re-Evaluation 12/10/22    Authorization Type [employer]ICARE    Progress Note Due on Visit 10    PT Start Time 0930    PT Stop Time 1020    PT Time Calculation (min) 50 min    Equipment Utilized During Treatment Gait belt    Activity Tolerance Patient tolerated treatment well;Other (comment)   BP trending low, within limits and pt asymptomatic   Behavior During Therapy [facility] for tasks assessed/performed             Past Medical History:  Diagnosis Date   CERVICAL POLYP 03/11/2008   Qualifier: Diagnosis of  By: [name] MD, [name]    Colon polyps 2005   on colonscopy [name]   Fibroid 2004   Per Dr. [name]   History of shingles    face and mouth   Hx of skin cancer, basal cell    Rosacea    Sciatica of left side 09/28/2013   Scoliosis    noted on mri done for back pain   Past Surgical History:  Procedure Laterality Date   BUNIONECTOMY     Patient Active Problem List   Diagnosis Date Noted   Buttock wound, left, initial encounter 11/02/2022   Orthostatic hypotension 08/13/2022   Neurogenic bowel 05/03/2022   Spasticity 05/03/2022   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/03/2022   Nerve pain 05/03/2022   Medication monitoring encounter 01/08/2022   Neurogenic bladder 10/11/2021   Urinary incontinence 10/11/2021   ESBL (extended spectrum beta-lactamase) producing bacteria infection 10/09/2021   Recurrent UTI 10/09/2021   Quadriplegia, C5-C7 incomplete (HCC) 01/16/2021   History of spinal fracture 01/16/2021   Suprapubic catheter (HCC) 01/16/2021   Encounter for routine gynecological examination 09/28/2013   Onychomycosis 09/28/2013    Foot deformity, acquired 03/26/2012   Encounter for preventive health examination 12/25/2010   ROSACEA 08/25/2009   Disturbance in sleep behavior 03/11/2008   SKIN CANCER, HX OF 03/11/2008   DYSURIA, HX OF 03/11/2008   Hyperlipidemia 02/10/2007   CERVICALGIA 02/10/2007    ONSET DATE: 09/28/2022 (most recent referral)  REFERRING DIAG: G82.54 (ICD-10-CM) - Quadriplegia, C5-C7, incomplete (HCC)  THERAPY DIAG:  Muscle weakness (generalized)  Other lack of coordination  Abnormal posture  Rationale for Evaluation and Treatment: Rehabilitation  SUBJECTIVE:  SUBJECTIVE STATEMENT: Pt reports arm fatigue from session prior.  Her BP is her usual low this morning and she was a little sweaty earlier.  She is not feeling great, but her temperature was 97.2 at home.  She did not sleep well last night.  Janet unclamps catheter in preparation for standing in stander.  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 Yes: NPRS scale: 3/10 Pain location: forearms to fingertips Pain description: constant, pinprick/tingling Aggravating factors: nighttime Relieving factors: nothing, sometimes medicines  PRECAUTIONS: Fall; suprapubic catheter (she wants to get this removed meaning she needs to get to and from the toilet); she has had minor heat sensation when needing to complete her bowel program-possible AD?  WEIGHT BEARING RESTRICTIONS: No-pt was in stander at most recent therapy in [place] last week.  She will have a bone density done soon w/ Dr. [name].  FALLS: Has patient fallen in last 6 months? No  LIVING ENVIRONMENT: Lives with: lives with their spouse and live-in nurse  [name] Lives in: House/apartment-townhouse Stairs: No-level entry, but multi-level home 4 stories w/ elevator Has following equipment at home: Wheelchair (power), Wheelchair (manual), Grab bars, and standing frame, sliding board, transport shower chair, handheld shower head, leg strap-pt reports she no longer finds this helpful  PLOF: Requires assistive device for independence, Needs assistance with ADLs, Needs assistance with homemaking, and Needs assistance with transfers  OCCUPATION:  Writer-uses Dragon to dictate  PATIENT GOALS: "Make my right leg work."  Stand and pivot so she can more easily access a toilet.  OBJECTIVE:   DIAGNOSTIC FINDINGS: No recent relevant imaging.  Bone density scheduled 10/15/2022.  COGNITION: Overall cognitive status: Within functional limits for tasks assessed   SENSATION: Light touch: Diminished ability to distinguish sharp and dull, but able to distinguish light touch from injury level down accurately  COORDINATION: Not formally assessed.  EDEMA:  Well managed w/ lymphatic massage and compression stockings.  MUSCLE TONE: Pt has intermittent clonus during transfers.  POSTURE: rounded shoulders, posterior pelvic tilt, and right thoracic scoliosis   LOWER EXTREMITY ROM:     Passive  Right 10/20/22 Left 10/20/22  Hip flexion [facility]  Hip extension    Hip abduction    Hip adduction    Hip internal rotation [facility] WFL  Hip external rotation [facility]  Knee flexion [facility] WFL  Knee extension [facility] WFL  Ankle dorsiflexion Slight PF contracture Slight PF contracture  Ankle plantarflexion    Ankle inversion    Ankle eversion     (Blank rows = not tested)    LOWER EXTREMITY MMT:    MMT Right 10/20/22 Left 10/20/22  Hip flexion 1 2+  Hip extension    Hip abduction    Hip adduction    Hip internal rotation    Hip external rotation    Knee flexion 2- 3  Knee extension 2- 3  Ankle dorsiflexion 0 3  Ankle plantarflexion    Ankle inversion    Ankle  eversion     (Blank rows = not tested)   BED MOBILITY:  Sit to supine Mod A Supine to sit Mod A Rolling to Right Mod A Rolling to Left Mod A Undulating mattress for wound management on standard bed (elevated-so often doing uphill sliding board transfers); she would like to continue working on sitting up independently, she has been working on  rolling, needs less assistance w/ this when someone props her leg into hooklying; would like something to help her pull her left leg to her butt for stretching as well as bed mobility.  TRANSFERS: Pt performs lateral scoot over to right then left wc <> mat minA for foot positioning only, pt is on bladder training so catheter clamped off during transfer but with therapist to maintain proximity.  Pt able to intermittently PF/push w/ LLE when positioned in mechanically advantageous position by therapist.  BP assessed on RUE prior to session: 84/52, HR 75bpm  Seated inclined crunches using compensatory muscular and head and periscapular muscles to go to end range of anterior lean stability in progressed depth  Reassessed R BP: 97/59, HR 77bpm  FUNCTIONAL TESTS:  None relevant to pt's current functional level and abilities.  PATIENT SURVEYS:  None completed due to time.  TODAY'S TREATMENT:       TherAct Pt received seated in PWC with her aide [name] present. [name] hands-on and assisting pt with transfers during session. Lateral scoot transfer PWC to mat table to left with min A needed for some repositioning of her BLE.  Repeated x4 lateral scoot/bump transfer left (uphill) and then right (downhill) progressing to 23" height to work towards pt goal of higher transfers.  On third lateral left transfer at highest height and due to fatigue pt required yoga block placement for pushoff midway through in order to obtain enough bottom clearance.  Pt fatigued following this practice so transitioned to prone modA for UE, trunk, and LE placement (until point of  leverage for improved pt engagement to getting into prone).  In prone PT provides quad stretch 3x45 seconds each LE.  Continued with IR/ER/DF stretch x30 seconds each, pt being less tight with these.  Pt independent returning to supine w/ PT managing catheter bag.   She bridges x4 to return to EOM prior to sitting up w/ min-modA at hips and to place LE into hooklying.  PT provides modA for trunk assistance into sitting.  Slide board performed to right due to fatigue at end of session w/ caregiver and pt.  PATIENT EDUCATION: Education details: Purpose of practicing elevated height bump transfers. Person educated: Patient and Arts administrator Education method: Explanation Education comprehension: verbalized understanding  HOME EXERCISE PROGRAM: Will be established as needed as pt has done continuous therapy and is working towards functional tasks.  GOALS: Goals reviewed with patient? Yes  SHORT TERM GOALS: Target date: 11/12/2022  HEP to be established for stretching and strengthening as needed. Baseline:  To be established. Goal status: INITIAL  2.  Pt will be able to perform rolling L and R w/ no more than minA in order to improve functional mobility. Baseline: modA Goal status: INITIAL  3.  Pt will perform sit<>supine requiring no more than minA in order to improve functional mobility. Baseline: modA Goal status: INITIAL  4.  Pt will perform bilateral reach outside seated BOS with feet supported without LOB in order to improve safety with ADL performance. Baseline: lateral LOB bilaterally demonstrated in w/c Goal status: INITIAL  LONG TERM GOALS: Target date: 12/10/2022  Pt will perform squat pivot transfer w/ no more than modA in order to improve access to home environment for toilet transfers. Baseline: minA for lateral scoot transfer, pt able to clear bottom intermittently Goal status: INITIAL  2.  Pt will perform sit<>supine requiring no more than supervision in order to  improve transfers in and out of bed. Baseline:  modA Goal status: INITIAL  3.  Pt will report wound healing in order to return to [facility] program [facility]. Baseline:  Ischial wound Goal status: INITIAL  4.  Pt will be able to perform rolling L and R w/ no more than supervision in order to improve functional mobility. Baseline: modA Goal status: INITIAL  5.  Pt will perform uphill sliding board transfer w/ no more than modA in order to improve transfer in home environment. Baseline:  To be assessed. Goal status: INITIAL  6.  Pt will perform partial STS in stander or from elevated power w/c to counter w/ no more than modA in order to improve capacity for higher level transfers and access to uneven surfaces. Baseline: To be assessed. Goal status: INITIAL  ASSESSMENT:  CLINICAL IMPRESSION: Focus of skilled PT session today on improving effectiveness and LE engagement to lateral transfers with elevated height.  Patient's max tolerated height today was 23" and she demonstrates good LLE push throughout.  Her quads were noticeably more tight during passive stretching this visit indicating good active engagement to prior tasks.  She continues to benefit from skilled PT to address deficits in mobility to progress independence in home setting and quality of life.  OBJECTIVE IMPAIRMENTS: decreased balance, decreased coordination, decreased mobility, difficulty walking, decreased ROM, decreased strength, hypomobility, increased edema, impaired flexibility, impaired sensation, impaired tone, impaired UE functional use, improper body mechanics, postural dysfunction, and pain.   ACTIVITY LIMITATIONS: carrying, lifting, bending, sitting, standing, squatting, stairs, transfers, bed mobility, continence, bathing, toileting, dressing, reach over head, hygiene/grooming, locomotion level, and caring for others  PARTICIPATION LIMITATIONS: meal prep, cleaning, laundry, driving, and community  activity  PERSONAL FACTORS: Age, Fitness, Past/current experiences, Time since onset of injury/illness/exacerbation, and 1-2 comorbidities: intermittent AD, neurogenic bowel/bladder on active bladder training  are also affecting patient's functional outcome.   REHAB POTENTIAL: Good  CLINICAL DECISION MAKING: Evolving/moderate complexity  EVALUATION COMPLEXITY: Moderate  PLAN:  PT FREQUENCY: 2x/week  PT DURATION: 8 weeks  PLANNED INTERVENTIONS: Therapeutic exercises, Therapeutic activity, Neuromuscular re-education, Balance training, Patient/Family education, Self Care, DME instructions, Electrical stimulation, Wheelchair mobility training, Manual therapy, and Re-evaluation  PLAN FOR NEXT SESSION:  STANDER!  Further assess and address multilevel slide board transfers.  Education on pressure relief for wound healing.  Core strength, static sitting balance-perturbations.  Bed mobility.  Use stander to practice weight-bearing and core as tolerated--monitor BP due to history of OH, can work towards decreased sling support in standing, use of powderboard on mat table for RLE strengthening, wants to work on increasing independence with lateral scoot transfers without SB and also wants to be able to place SB independently and move her LE more independently  Sadie Haber, PT, DPT 11/05/2022, 10:24 AM

## 2022-11-08 ENCOUNTER — Other Ambulatory Visit: Payer: Self-pay | Admitting: Physical Medicine and Rehabilitation

## 2022-11-09 ENCOUNTER — Ambulatory Visit: Payer: Medicare PPO | Admitting: Occupational Therapy

## 2022-11-09 ENCOUNTER — Ambulatory Visit: Payer: Medicare PPO | Admitting: Physical Therapy

## 2022-11-09 ENCOUNTER — Encounter (HOSPITAL_BASED_OUTPATIENT_CLINIC_OR_DEPARTMENT_OTHER): Payer: Medicare PPO | Admitting: Internal Medicine

## 2022-11-09 DIAGNOSIS — R29898 Other symptoms and signs involving the musculoskeletal system: Secondary | ICD-10-CM | POA: Diagnosis not present

## 2022-11-09 DIAGNOSIS — T798XXA Other early complications of trauma, initial encounter: Secondary | ICD-10-CM | POA: Diagnosis not present

## 2022-11-09 DIAGNOSIS — R29818 Other symptoms and signs involving the nervous system: Secondary | ICD-10-CM | POA: Diagnosis not present

## 2022-11-09 DIAGNOSIS — L89323 Pressure ulcer of left buttock, stage 3: Secondary | ICD-10-CM

## 2022-11-09 DIAGNOSIS — M24541 Contracture, right hand: Secondary | ICD-10-CM | POA: Diagnosis not present

## 2022-11-09 DIAGNOSIS — G8253 Quadriplegia, C5-C7 complete: Secondary | ICD-10-CM | POA: Diagnosis not present

## 2022-11-09 DIAGNOSIS — G8250 Quadriplegia, unspecified: Secondary | ICD-10-CM | POA: Diagnosis not present

## 2022-11-09 DIAGNOSIS — G8254 Quadriplegia, C5-C7 incomplete: Secondary | ICD-10-CM | POA: Diagnosis not present

## 2022-11-09 DIAGNOSIS — R278 Other lack of coordination: Secondary | ICD-10-CM

## 2022-11-09 DIAGNOSIS — M6281 Muscle weakness (generalized): Secondary | ICD-10-CM

## 2022-11-09 DIAGNOSIS — R293 Abnormal posture: Secondary | ICD-10-CM | POA: Diagnosis not present

## 2022-11-09 DIAGNOSIS — M24542 Contracture, left hand: Secondary | ICD-10-CM | POA: Diagnosis not present

## 2022-11-09 NOTE — Therapy (Signed)
OUTPATIENT OCCUPATIONAL THERAPY NEURO TREATMENT  Patient Name: [name] MRN: [id number] DOB:12-20-1950, 72 y.o., female Today's Date: 11/09/2022  PCP: Carmen Headings, MD  REFERRING PROVIDER: [name], MD  END OF SESSION:  OT End of Session - 11/09/22 1405     Visit Number 8    Number of Visits 17    Date for OT Re-Evaluation 12/10/22    Authorization Type [employer] - requires auth, [place]    Progress Note Due on Visit 10    OT Start Time 1405    OT Stop Time 1447    OT Time Calculation (min) 42 min    Equipment Utilized During Treatment Patient in Power [facility]    Activity Tolerance Patient tolerated treatment well    Behavior During Therapy [facility] for tasks assessed/performed              Past Medical History:  Diagnosis Date   CERVICAL POLYP 03/11/2008   Qualifier: Diagnosis of  By: Carmen Sharp MD, [name]    Colon polyps 2005   on colonscopy Dr. Russella [name]   Fibroid 2004   Per Dr. Dareen [name]   History of shingles    face and mouth   Hx of skin cancer, basal cell    Rosacea    Sciatica of left side 09/28/2013   Scoliosis    noted on mri done for back pain   Past Surgical History:  Procedure Laterality Date   BUNIONECTOMY     Patient Active Problem List   Diagnosis Date Noted   Buttock wound, left, initial encounter 11/02/2022   Orthostatic hypotension 08/13/2022   Neurogenic bowel 05/03/2022   Spasticity 05/03/2022   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/03/2022   Nerve pain 05/03/2022   Medication monitoring encounter 01/08/2022   Neurogenic bladder 10/11/2021   Urinary incontinence 10/11/2021   ESBL (extended spectrum beta-lactamase) producing bacteria infection 10/09/2021   Recurrent UTI 10/09/2021   Quadriplegia, C5-C7 incomplete (HCC) 01/16/2021   History of spinal fracture 01/16/2021   Suprapubic catheter (HCC) 01/16/2021   Encounter for routine gynecological examination 09/28/2013   Onychomycosis 09/28/2013   Foot deformity, acquired 03/26/2012    Encounter for preventive health examination 12/25/2010   ROSACEA 08/25/2009   Disturbance in sleep behavior 03/11/2008   SKIN CANCER, HX OF 03/11/2008   DYSURIA, HX OF 03/11/2008   Hyperlipidemia 02/10/2007   CERVICALGIA 02/10/2007    ONSET DATE: 07/28/2020  Date of Referral 09/28/22  REFERRING DIAG: G82.54 (ICD-10-CM) - Quadriplegia, C5-C7, incomplete (HCC)  THERAPY DIAG:  Other lack of coordination  Muscle weakness (generalized)  Quadriplegia, C5-C7 incomplete (HCC)  Rationale for Evaluation and Treatment: Rehabilitation  SUBJECTIVE:   SUBJECTIVE STATEMENT: Patient reports continuing to be overwhelmed by all the exercises and would like her activities to be categorized by specific areas etc.  And she reported wanting some more hands-on activities by therapist as well.   Pt accompanied by:  Live in Caregiver - [name]   PERTINENT HISTORY: "Pt is a 72 yr old L handed female with hx of incomplete quadriplegia- 2/14 2022- fleeing the police in [place] on passenger 100 (high speed) miles/hour,  [name]; neurogenic bowel and bladder and spasticity; [name], [name] Here for f/u on Incomplete quadriplegia"  Referral from MD 09/28/22 states, "Please eval and treat for ADLs and higher level mobility."  PRECAUTIONS: Fall; suprapubic catheter (she wants to get this removed meaning she needs to get to and from the toilet); she has had minor heat sensation  when needing to complete her bowel program-possible AD?   WEIGHT BEARING RESTRICTIONS: No-pt was in stander at most recent therapy in [place] last week.  She will have a bone density done soon w/ Dr. Fabian [name].   PAIN:  Patient reports chronic pain 3/10 fingers to elbow bilaterally managed by medication and will inform therapy staff of any changes.  FALLS: Has patient fallen in last 6 months? No  LIVING ENVIRONMENT: Lives with: lives with their family - husband [name] and with an adult companion s/p moving back up from  [place] x10 months Lives in: House/apartment Stairs:  4 story town house with an Engineer, structural with threshold adjustments, roll in shower with transport chair Has following equipment at home: Wheelchair (power) - with seat height adjustments to access counters and reclining option, Wheelchair (manual), transport WC, shower chair, and Ramped entry, handheld showerhead with rails around toilet, had Michiel Sites but is no longer in need of it, has slide boards x3  PLOF: Requires assistive device for independence, Needs assistance with ADLs, Needs assistance with homemaking, Needs assistance with gait, and Needs assistance with transfers; 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  Used to like to knit, sew and bake.  PATIENT GOALS: Wants to be able to type - currently using advanced Dragon dictation at times but prefers to type at times.  She would like to be able to write better and is interested in resuming some leisure activities such as Archivist and baking.  She also wants to keep working on being able to cut her own food and on her UE strength.   OBJECTIVE:   HAND DOMINANCE: Left  ADLs: Overall ADLs: Patient has a live in caregiver  Transfers/ambulation related to ADLs: Mod assist with sliding board transfers (previously [employer] lift).  Eating: Has a rocker knife that she can use. Used to use adapted utensils but now uses regular utensils but still will get assistance to cut food ie) when eating out.  Grooming: can brush her own hair but unable to manage jewelry ie) earrings  UB Dressing: can zip/unzip after it has been started, unable to manage buttons herself, Caregiver assists but if she has extra time, she can put on her bra, and a loose fitting pullover shirt/t-shirt  LB Dressing: dependent for LB dressing in bed and with special sock donner for LE compression garments   Toileting: bladder trained with suprapubic catheter which she clamps off.  Dependent for bowel incontinence  care.  Bathing: Sponge bath with adult washclothes.  Can bath UB with back scrubber for most of her back.  Needs help with feet (mentioned she might need a separate brush for feet)   Tub Shower transfers: Min-mod assist with slide board  Equipment: Shower seat with back, Walk in shower, bed side commode, Reacher, Sock aid, Long handled sponge, and Feeding equipment  IADLs: --  Shopping: Assisted by caregiver  Light housekeeping: Has housekeeper that comes monthly  Meal Prep: previously enjoyed baking. Assisted by caregiver but described recent success at reheating a meal for herself after getting food out of the fridge/freezer from her WC.  Community mobility: Dependent  Medication management: Caregiver sorts them into pillbox but she is very aware of her medications   Financial management: Patient manages her own finances  Handwriting: Increased time and has a pen with a little grip  MOBILITY STATUS: Independent with power mobiity  POSTURE COMMENTS:  No Significant postural limitations and forward head Sitting balance: Supports self independently with both Ues  ACTIVITY TOLERANCE: Activity tolerance: Fair - MMT WFL but has limited sustained tolerance for ongoing use of UEs  FUNCTIONAL OUTCOME MEASURES: 10/20/22 QuickDash 31.8 points   UPPER EXTREMITY ROM:   AROM - WFL without obvious contractures, some digital flexion noted but PROM WNL   AROM Right (eval) Left (eval)  Shoulder flexion [facility]  Shoulder abduction [facility]  Elbow flexion [facility] WFL  Elbow extension [facility]  Wrist flexion [facility] WFL  Wrist extension WFL WFL  Ulnar deviation WFL Decreased ulnar  deviation past midline  Wrist pronation [facility] WFL  Wrist supination [facility]  Digit Composite Flexion Lacks full AROM:   1st digit - 5 cm   3rd digit -1 cm   4th digit - 2 cm  Lacks full AROM:   1st digit - 1.5 cm  5th digit - 3 cm   Digit Composite Extension [facility]  Digit Opposition Opposition to index  finger only Lacks to pinkie due to limited DIP pinkie flexion  (Blank rows = not tested)  UPPER EXTREMITY MMT:   Grossly WFL - Endurance limited R tricep strength > than L but L UE generally stronger than R UE  MMT Right (eval) Left (eval)  Shoulder flexion 4/5 4/5  Shoulder abduction 4/5 4/5  Elbow flexion 4/5 4/5  Elbow extension 4/5 4-/5  (Blank rows = not tested)  HAND FUNCTION: Grip strength: Right: 4.8 lbs; Left: 20.9 lbs  COORDINATION: Finger Nose Finger test: R generally WFL, L WNL Box and Blocks:  Right 28 blocks, Left 37 blocks R hand finger eventually cramps and dexterity worsens in the cold  SENSATION: Light touch: Impaired  - patient   EDEMA: NA for UEs but LE has poor lymph drainage with custom compression garments   MUSCLE TONE: Generally WFL but will assess further  COGNITION: Overall cognitive status: Within functional limits for tasks assessed  VISION: Subjective report: Patent wears progressive lens/glasses.  Denies diplopia or vision changes but has eye exam in the next couple of months. Baseline vision: Wears glasses all the time  VISION ASSESSMENT: WFL  OBSERVATIONS: Patient independent with power WC navigation within clinic.  Patient is well-kept with foley catheter in place.  She has slight limitations in full extension of digits but PROM is WNL and she has splints at home that she said she will bring for OT staff to assess.  She has functional ROM of B UEs to reach her head, behind her back and to cross midline to assist with ADLs.  She had fairly    TODAY'S TREATMENT:                                                                                                                                  - Therapeutic exercises reviewed with patient desiring "Groupings" of tasks/exercises to improve areas such as: PINCH, GRIP, and TYPING (to name a few).  Patient engaged in gripping activities with fair  success with index/long fingers but less grip with  ring/pinky fingers.  She has decreased PIP flexion of joints and some tendon gliding activities were trialled to improve isolated finger joint flexion and extension. She was engaged in holding a brown foam handled pen and resisting removal from her hand with success pulling towards thumb side with L hand, some success with R side but increased difficulty when resisting pull out the ulnar side.  She is guided to flex fingers around the handle for improved sustained grip.  Self care -  Patient reported wanting to be able to tie her shoelaces and was engaged in working on removing and donning shoes.  She can lean forward and untie her shoelaces and then sit upright. She was able to remove L shoe by lifting her foot off the foot pedal.  She was encouraged to practice removing R shoe first as she is not able to get it off after removing L shoe.  With R leg/shoe over L ankle/foot, she removed her R shoe and then used the same technique as before to remove her L shoe ie) pulling it against the foot pedal.  She could lift her foot to get her L foot in to the shoe on the floor (not the WC foot pedal though).  She can make the initial cross and tie of shoelaces while leaning forward.    PATIENT EDUCATION: Education details: Reviewed UE activities to work on person educated: Patient and Software engineer in caregiver - [name] Education method: Explanation, Demonstration, Tactile cues, and Verbal cues,  Education comprehension: verbalized understanding, returned demonstration, and needs further education Patient desires specific list of exercises by groupings.  HOME EXERCISE PROGRAM: 10/27/22 - Thera[employer]ty  Access Code: [email or web address] URL: [email or web address] Date: 10/27/2022 Prepared by: Amada Kingfisher   GOALS:   SHORT TERM GOALS: Target date: 11/09/22  Patient will be able to use AE/modified techniques to cut soft foods small enough for oral intake. Baseline: Caregiver/spouse assist Goal status: IN  PROGRESS  2.  Patient will be able to use AE/modified positioning to complete word search by drawing lines through words with 0 errors. Baseline: TBD Goal status: INITIAL  3.  Patient will verbalize understanding of good pressure relief schedule (for 15 to 60 seconds every 15 to 60 minutes) to help with wound healing. Baseline: Patient did not change positioning in > 45 minutes of OT eval. Goal status: IN PROGRESS  4.  Patient will be assisted to explore modifications for leisure tasks (knit/sew/bake) with good return demonstration. Baseline: Minimal involvement Goal status: IN PROGRESS  5.  Patient will demonstrate independence with HEP for UE strengthening, coordination and ROM to prevent contractures and maintain strength for transfers and ADLs. Baseline: Previous HEPs have been established but need to be reviewed and updated.  Goal status: IN PROGRESS  6.  Patient will be assessed for typing speed/dexterity. Baseline: Patient reports difficulty with typing with all her fingers. Goal status: IN PROGRESS  7. Patient will complete Quick Dash UE assessment. Baseline: TBD Goal Status: 10/20/22 Discontinued - see scores above   LONG TERM GOALS: Target date: 12/10/22  Patient will complete 1 small craft/week r/t her leisure interests to work on FMS daily. Baseline: Minimal involvement Goal status: IN PROGRESS  2.  Patient will improve B coordination for increased typing speed/dexterity x 1-2 WPM. Baseline: TBD Goal status: IN PROGRESS  3.  Patient will improve B UE coordination, strength and functional use to bake cookies with setup assistance and AE/modified techniques as appropriate.  Baseline: Not performed. Goal status: IN PROGRESS  4.  Patient will report no more than moderate difficulty using a knife to foods such as chicken, small enough for oral intake using AE and strategies as needed. Baseline: Caregiver/spouse assist Goal status: IN PROGRESS  5.  Patient will be able  to use AE/modified positioning to complete 4 sentences with 100% legibility. Baseline: Subjective reports of difficulty with writing Goal status: INITIAL   ASSESSMENT:  CLINICAL IMPRESSION: Patient demonstrates understanding of HEP but desires and would benefit from increased organization of activities including how to use equipment she has, ideas for strength, pinch, typing etc.  Patient would benefit from HEP calendar, once she has complied various activities to assist patient with an increased consistent exercises schedule.  PERFORMANCE DEFICITS: in functional skills including ADLs, IADLs, coordination, dexterity, strength, muscle spasms, Fine motor control, Gross motor control, continence, skin integrity, and UE functional use,   IMPAIRMENTS: are limiting patient from ADLs, IADLs, work, and leisure.   CO-MORBIDITIES: has co-morbidities such as incontinence and wound  that affects occupational performance. Patient will benefit from skilled OT to address above impairments and improve overall function.  REHAB POTENTIAL: Fair due to chronicity of injury   PLAN:  OT FREQUENCY: 2x/week  OT DURATION: 8 weeks  PLANNED INTERVENTIONS: self care/ADL training, therapeutic exercise, therapeutic activity, neuromuscular re-education, manual therapy, passive range of motion, balance training, functional mobility training, splinting, patient/family education, energy conservation, coping strategies training, and DME and/or AE instructions  RECOMMENDED OTHER SERVICES: Patient was seen for PT evaluation today with treatment plans coordinated for 2x/week.  CONSULTED AND AGREED WITH PLAN OF CARE: Patient and family member/caregiver  PLAN FOR NEXT SESSION: Continue to review BUE HEP including administration of HEP calendar. Use (.pdexchart) for template  Need to review OT goals for modifications and suggestions re: further activities also ie) Continue FM tasks to assess and improve typing and simple  hobby/craft activities (ie baking, yarn activities).   As needed - will provide information re: prefab soft splints/orthotist option.   Victorino Sparrow, OT 11/09/2022, 5:30 PM   .pdex

## 2022-11-09 NOTE — Therapy (Signed)
OUTPATIENT PHYSICAL THERAPY NEURO TREATMENT   Patient Name: [name] MRN: [id number] DOB:[birth year], 72 y.o., female Today's Date: 11/09/2022   PCP: [name], MD REFERRING PROVIDER: [name], MD  END OF SESSION:  PT End of Session - 11/09/22 1335     Visit Number 8    Number of Visits 17   16 + eval   Date for PT Re-Evaluation 12/10/22    Authorization Type [employer] MEDICARE    Progress Note Due on Visit 10    PT Start Time 1315    PT Stop Time 1400    PT Time Calculation (min) 45 min    Equipment Utilized During Treatment Gait belt    Activity Tolerance Patient tolerated treatment well;Other (comment)   BP trending low, within limits and pt asymptomatic   Behavior During Therapy [facility] for tasks assessed/performed              Past Medical History:  Diagnosis Date   CERVICAL POLYP 03/11/2008   Qualifier: Diagnosis of  By: [name] MD, [name]    Colon polyps 2005   on colonscopy [name]   Fibroid 2004   Per Dr. [name]   History of shingles    face and mouth   Hx of skin cancer, basal cell    Rosacea    Sciatica of left side 09/28/2013   Scoliosis    noted on mri done for back pain   Past Surgical History:  Procedure Laterality Date   BUNIONECTOMY     Patient Active Problem List   Diagnosis Date Noted   Buttock wound, left, initial encounter 11/02/2022   Orthostatic hypotension 08/13/2022   Neurogenic bowel 05/03/2022   Spasticity 05/03/2022   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/03/2022   Nerve pain 05/03/2022   Medication monitoring encounter 01/08/2022   Neurogenic bladder 10/11/2021   Urinary incontinence 10/11/2021   ESBL (extended spectrum beta-lactamase) producing bacteria infection 10/09/2021   Recurrent UTI 10/09/2021   Quadriplegia, C5-C7 incomplete (HCC) 01/16/2021   History of spinal fracture 01/16/2021   Suprapubic catheter (HCC) 01/16/2021   Encounter for routine gynecological examination 09/28/2013   Onychomycosis 09/28/2013    Foot deformity, acquired 03/26/2012   Encounter for preventive health examination 12/25/2010   ROSACEA 08/25/2009   Disturbance in sleep behavior 03/11/2008   SKIN CANCER, HX OF 03/11/2008   DYSURIA, HX OF 03/11/2008   Hyperlipidemia 02/10/2007   CERVICALGIA 02/10/2007    ONSET DATE: 09/28/2022 (most recent referral)  REFERRING DIAG: G82.54 (ICD-10-CM) - Quadriplegia, C5-C7, incomplete (HCC)  THERAPY DIAG:  Muscle weakness (generalized)  Other lack of coordination  Abnormal posture  Quadriplegia, C5-C7 incomplete (HCC)  Rationale for Evaluation and Treatment: Rehabilitation  SUBJECTIVE:  SUBJECTIVE STATEMENT: Pt reports no significant changes since last visit, her wound is "not good" but just went to wound care earlier today. Pt also reports that she found a stander she can get locally, should get it Sunday.  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 Yes: NPRS scale: 3/10 Pain location: forearms to fingertips Pain description: constant, pinprick/tingling Aggravating factors: nighttime Relieving factors: nothing, sometimes medicines  PRECAUTIONS: Fall; suprapubic catheter (she wants to get this removed meaning she needs to get to and from the toilet); she has had minor heat sensation when needing to complete her bowel program-possible AD?  WEIGHT BEARING RESTRICTIONS: No-pt was in stander at most recent therapy in [place] last week.  She will have a bone density done soon w/ Dr. [name].  FALLS: Has patient fallen in last 6 months? No  LIVING ENVIRONMENT: Lives with: lives with their spouse and live-in nurse [name] Lives in: House/apartment-townhouse Stairs:  No-level entry, but multi-level home 4 stories w/ elevator Has following equipment at home: Wheelchair (power), Wheelchair (manual), Grab bars, and standing frame, sliding board, transport shower chair, handheld shower head, leg strap-pt reports she no longer finds this helpful  PLOF: Requires assistive device for independence, Needs assistance with ADLs, Needs assistance with homemaking, and Needs assistance with transfers  OCCUPATION:  Writer-uses Dragon to dictate  PATIENT GOALS: "Make my right leg work."  Stand and pivot so she can more easily access a toilet.  OBJECTIVE:   DIAGNOSTIC FINDINGS: No recent relevant imaging.  Bone density scheduled 10/15/2022.  COGNITION: Overall cognitive status: Within functional limits for tasks assessed   SENSATION: Light touch: Diminished ability to distinguish sharp and dull, but able to distinguish light touch from injury level down accurately  COORDINATION: Not formally assessed.  EDEMA:  Well managed w/ lymphatic massage and compression stockings.  MUSCLE TONE: Pt has intermittent clonus during transfers.  POSTURE: rounded shoulders, posterior pelvic tilt, and right thoracic scoliosis   LOWER EXTREMITY ROM:     Passive  Right 10/20/22 Left 10/20/22  Hip flexion [facility]  Hip extension    Hip abduction    Hip adduction    Hip internal rotation [facility] WFL  Hip external rotation [facility]  Knee flexion [facility] WFL  Knee extension [facility] WFL  Ankle dorsiflexion Slight PF contracture Slight PF contracture  Ankle plantarflexion    Ankle inversion    Ankle eversion     (Blank rows = not tested)    LOWER EXTREMITY MMT:    MMT Right 10/20/22 Left 10/20/22  Hip flexion 1 2+  Hip extension    Hip abduction    Hip adduction    Hip internal rotation    Hip external rotation    Knee flexion 2- 3  Knee extension 2- 3  Ankle dorsiflexion 0 3  Ankle plantarflexion    Ankle inversion    Ankle eversion     (Blank rows = not tested)   BED  MOBILITY:  Sit to supine Mod A Supine to sit Mod A Rolling to Right Mod A Rolling to Left Mod A Undulating mattress for wound management on standard bed (elevated-so often doing uphill sliding board transfers); she would like to continue working on sitting up independently, she has been working on rolling, needs less assistance w/ this when someone props her leg into hooklying; would like something to  help her pull her left leg to her butt for stretching as well as bed mobility.  FUNCTIONAL TESTS:  None relevant to pt's current functional level and abilities.  PATIENT SURVEYS:  None completed due to time.  TODAY'S TREATMENT:       TherAct Pt received seated in PWC. Lateral scoot transfer "bumping" from [facility] to level mat table to the L with some assist from [place] for LE placement. Supine to sit with mod A needed for BLE management. Supine to rolling onto R side with assist needed to bring LLE into hook lying position, pt able to roll independently once LLE placed. Pt able to roll to her L side in similar manner with assist needed to place R side in hooklying. While in L sidelying placed powder board. See NMR for exercises performed with powderboard this session with RLE. Transition from L sidelying to sitting EOM with some assist needed to bring LE to side of mat/off side of mat and assist needed to lift L shoulder off of mat table to initiate press-up from this position. Pt also utilizes arm of chair placed next to mat table to assist her with transfer. Pt able to complete remainder of transfer independently. Slide board transfer mat table to PWC to the R with assist needed to place board and for LE management. Pt left seated in PWC with caregiver [name] to wait for OT session.  NMR Sidelying on L with powderboard on mat table: Knee flexion/extension (pt exhibits good quad activation and knee extension, 1/5 HS with AAROM needed) Hip flexion/extension with AAROM (1/5 for each with quads helping with  extension) Ankle PF/DF x 5 reps with decreased DF as compared to PF and decrease in amplitude of movement with each repetition   PATIENT EDUCATION: Education details: PT POC with initiation of goal assessment Person educated: Patient and Arts administrator Education method: Explanation Education comprehension: verbalized understanding  HOME EXERCISE PROGRAM: Will be established as needed as pt has done continuous therapy and is working towards functional tasks.  GOALS: Goals reviewed with patient? Yes  SHORT TERM GOALS: Target date: 11/12/2022  HEP to be established for stretching and strengthening as needed. Baseline:  To be established. Goal status: INITIAL  2.  Pt will be able to perform rolling L and R w/ no more than minA in order to improve functional mobility. Baseline: modA, min A to setup LE into hookyling (5/28) Goal status: MET  3.  Pt will perform sit<>supine requiring no more than minA in order to improve functional mobility. Baseline: modA, mod A (5/28) Goal status: IN PROGRESS  4.  Pt will perform bilateral reach outside seated BOS with feet supported without LOB in order to improve safety with ADL performance. Baseline: lateral LOB bilaterally demonstrated in w/c Goal status: INITIAL  LONG TERM GOALS: Target date: 12/10/2022  Pt will perform squat pivot transfer w/ no more than modA in order to improve access to home environment for toilet transfers. Baseline: minA for lateral scoot transfer, pt able to clear bottom intermittently Goal status: INITIAL  2.  Pt will perform sit<>supine requiring no more than supervision in order to improve transfers in and out of bed. Baseline:  modA Goal status: INITIAL  3.  Pt will report wound healing in order to return to day program at [facility]. Baseline:  Ischial wound Goal status: INITIAL  4.  Pt will be able to perform rolling L and R w/ no more than supervision in order to improve functional  mobility. Baseline: modA Goal status: INITIAL  5.  Pt will perform uphill sliding board transfer w/ no more than modA in order to improve transfer in home environment. Baseline:  To be assessed. Goal status: INITIAL  6.  Pt will perform partial STS in stander or from elevated power w/c to counter w/ no more than modA in order to improve capacity for higher level transfers and access to uneven surfaces. Baseline: To be assessed. Goal status: INITIAL  ASSESSMENT:  CLINICAL IMPRESSION: Emphasis of skilled PT session on initiating STG assessment of rolling and supine to sit transfers. Pt does need some assist to place LE into hooklying position to prepare for rolling and needs assist for LE management and some trunk elevation during supine to sit. Pt open to trying use of leg loops/lifters in future sessions to increase her independence with LE management for bed mobility and transfers. Pt exhibits improved activation of RLE musculature in gravity-eliminated position on powderboard this session. Pt exhibits the most strength in her R quads with trace activation in her hamstrings, hip flexors, hip extensors (glutes) and ankle DF. Pt continues to benefit from skilled therapy services to work towards improving her RLE strength and increasing her independence with transfers and functional mobility. Continue POC.   OBJECTIVE IMPAIRMENTS: decreased balance, decreased coordination, decreased mobility, difficulty walking, decreased ROM, decreased strength, hypomobility, increased edema, impaired flexibility, impaired sensation, impaired tone, impaired UE functional use, improper body mechanics, postural dysfunction, and pain.   ACTIVITY LIMITATIONS: carrying, lifting, bending, sitting, standing, squatting, stairs, transfers, bed mobility, continence, bathing, toileting, dressing, reach over head, hygiene/grooming, locomotion level, and caring for others  PARTICIPATION LIMITATIONS: meal prep, cleaning,  laundry, driving, and community activity  PERSONAL FACTORS: Age, Fitness, Past/current experiences, Time since onset of injury/illness/exacerbation, and 1-2 comorbidities: intermittent AD, neurogenic bowel/bladder on active bladder training  are also affecting patient's functional outcome.   REHAB POTENTIAL: Good  CLINICAL DECISION MAKING: Evolving/moderate complexity  EVALUATION COMPLEXITY: Moderate  PLAN:  PT FREQUENCY: 2x/week  PT DURATION: 8 weeks  PLANNED INTERVENTIONS: Therapeutic exercises, Therapeutic activity, Neuromuscular re-education, Balance training, Patient/Family education, Self Care, DME instructions, Electrical stimulation, Wheelchair mobility training, Manual therapy, and Re-evaluation  PLAN FOR NEXT SESSION:  assess remainder of STG, Further assess and address multilevel slide board transfers.  Education on pressure relief for wound healing.  Core strength, static sitting balance-perturbations.  Bed mobility.  Use stander to practice weight-bearing and core as tolerated--monitor BP due to history of OH, can work towards decreased sling support in standing, use of powderboard on mat table for RLE strengthening, wants to work on increasing independence with lateral scoot transfers without SB and also wants to be able to place SB independently and move her LE more independently; use of leg loops/lifters to increase independence with LE management with transfers and bed mobility  Peter Congo, PT, DPT, CSRS 11/09/2022, 2:03 PM

## 2022-11-09 NOTE — Telephone Encounter (Signed)
Yes   your risk is high  another effect of  decrease mobility .  So besides optimizing vit d and making sure no other  causes.   Medications include the bisphosphonate in various forms ( first line usually)  prolia is na injection   not first like  ,  But these meds can aggravated any esophageal or reflux problems if not careful. There is an iv form.. How is your sore throat doing  Also  because of the very low t score  you may benefit  anabolic therapy first   Anabolic agents include teriparatide, abaloparatide, romosozumab.   Considering all of this I suggest  seeing an endocrinology specialist to discuss  Optimal regimen. Is it ok if we refer to endocrinology regarding opinion  for med intervention?

## 2022-11-10 ENCOUNTER — Ambulatory Visit: Payer: Medicare PPO

## 2022-11-11 ENCOUNTER — Ambulatory Visit: Payer: Medicare PPO | Admitting: Occupational Therapy

## 2022-11-11 ENCOUNTER — Ambulatory Visit: Payer: Medicare PPO | Admitting: Physical Therapy

## 2022-11-11 ENCOUNTER — Encounter: Payer: Self-pay | Admitting: Physical Therapy

## 2022-11-11 DIAGNOSIS — G8253 Quadriplegia, C5-C7 complete: Secondary | ICD-10-CM | POA: Diagnosis not present

## 2022-11-11 DIAGNOSIS — R278 Other lack of coordination: Secondary | ICD-10-CM | POA: Diagnosis not present

## 2022-11-11 DIAGNOSIS — M24542 Contracture, left hand: Secondary | ICD-10-CM | POA: Diagnosis not present

## 2022-11-11 DIAGNOSIS — M6281 Muscle weakness (generalized): Secondary | ICD-10-CM

## 2022-11-11 DIAGNOSIS — R293 Abnormal posture: Secondary | ICD-10-CM

## 2022-11-11 DIAGNOSIS — R29898 Other symptoms and signs involving the musculoskeletal system: Secondary | ICD-10-CM | POA: Diagnosis not present

## 2022-11-11 DIAGNOSIS — G8254 Quadriplegia, C5-C7 incomplete: Secondary | ICD-10-CM | POA: Diagnosis not present

## 2022-11-11 DIAGNOSIS — M24541 Contracture, right hand: Secondary | ICD-10-CM | POA: Diagnosis not present

## 2022-11-11 DIAGNOSIS — R29818 Other symptoms and signs involving the nervous system: Secondary | ICD-10-CM | POA: Diagnosis not present

## 2022-11-11 DIAGNOSIS — S31809A Unspecified open wound of unspecified buttock, initial encounter: Secondary | ICD-10-CM | POA: Diagnosis not present

## 2022-11-11 NOTE — Patient Instructions (Addendum)
Access Code: [email or web address] URL: [email or web address] Date: 11/11/2022 Prepared by: Amada Kingfisher  Exercises - Seated Elbow Extension with Self-Anchored Resistance  - 1 x daily - 2 sets - 10 reps - Supine Triceps Extension with Resistance  - 1 x daily - 2 sets - 10 reps - Seated Bilateral Elbow Extension  - 1 x daily - 2 sets - 10 reps  Access Code: [email or web address] URL: [email or web address] Date: 10/27/2022 Prepared by: Amada Kingfisher  Exercises - Putty Squeezes  - 1 x daily - 2 sets - 10 reps - Finger Pinch and Pull with Putty  - 1 x daily - 2 sets - 10 reps - Tip PUSH with Putty  - 1 x daily - 2 sets - 10 reps

## 2022-11-11 NOTE — Therapy (Signed)
OUTPATIENT PHYSICAL THERAPY NEURO TREATMENT-PROGRESS NOTE   Patient Name: [name] MRN: [id number] DOB:Nov 29, 1950, 72 y.o., female Today's Date: 11/11/2022   PCP: [name], MD REFERRING PROVIDER: [name], MD  PT progress note for [name].  Reporting period 10/12/2022 to 11/11/2022  See Note below for Objective Data and Assessment of Progress/Goals  Thank you for the referral of this patient. Camille Bal, PT, DPT   END OF SESSION:  11/11/22 1148  PT Visits / Re-Eval  Visit Number 9  Number of Visits 17 (16 + eval)  Date for PT Re-Evaluation 12/10/22  Authorization  Authorization Type [employer]  Progress Note Due on Visit 20  PT Time Calculation  PT Start Time 1148  PT Stop Time 1233  PT Time Calculation (min) 45 min  PT - End of Session  Equipment Utilized During Treatment Gait belt  Activity Tolerance Patient tolerated treatment well  Behavior During Therapy WFL for tasks assessed/performed     Past Medical History:  Diagnosis Date   CERVICAL POLYP 03/11/2008   Qualifier: Diagnosis of  By: [name] MD, [name]    Colon polyps 2005   on colonscopy [name]   Fibroid 2004   Per Dr. [name]   History of shingles    face and mouth   Hx of skin cancer, basal cell    Rosacea    Sciatica of left side 09/28/2013   Scoliosis    noted on mri done for back pain   Past Surgical History:  Procedure Laterality Date   BUNIONECTOMY     Patient Active Problem List   Diagnosis Date Noted   Buttock wound, left, initial encounter 11/02/2022   Orthostatic hypotension 08/13/2022   Neurogenic bowel 05/03/2022   Spasticity 05/03/2022   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/03/2022   Nerve pain 05/03/2022   Medication monitoring encounter 01/08/2022   Neurogenic bladder 10/11/2021   Urinary incontinence 10/11/2021   ESBL (extended spectrum beta-lactamase) producing bacteria infection 10/09/2021   Recurrent UTI 10/09/2021   Quadriplegia, C5-C7  incomplete (HCC) 01/16/2021   History of spinal fracture 01/16/2021   Suprapubic catheter (HCC) 01/16/2021   Encounter for routine gynecological examination 09/28/2013   Onychomycosis 09/28/2013   Foot deformity, acquired 03/26/2012   Encounter for preventive health examination 12/25/2010   ROSACEA 08/25/2009   Disturbance in sleep behavior 03/11/2008   SKIN CANCER, HX OF 03/11/2008   DYSURIA, HX OF 03/11/2008   Hyperlipidemia 02/10/2007   CERVICALGIA 02/10/2007    ONSET DATE: 09/28/2022 (most recent referral)  REFERRING DIAG: G82.54 (ICD-10-CM) - Quadriplegia, C5-C7, incomplete (HCC)  THERAPY DIAG:  Muscle weakness (generalized)  Other lack of coordination  Abnormal posture  Rationale for Evaluation and Treatment: Rehabilitation  SUBJECTIVE:  SUBJECTIVE STATEMENT: Pt reports no significant changes since last visit. She is still getting stander and it should still be here Sunday.  She also has a SciFit coming to her home w/ ramps being installed so she can get over the middle track of the seat part.  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 Yes: NPRS scale: 3/10 Pain location: forearms to fingertips Pain description: constant, pinprick/tingling Aggravating factors: nighttime Relieving factors: nothing, sometimes medicines  PRECAUTIONS: Fall; suprapubic catheter (she wants to get this removed meaning she needs to get to and from the toilet); she has had minor heat sensation when needing to complete her bowel program-possible AD?  WEIGHT BEARING RESTRICTIONS: No-pt was in stander at most recent therapy in [place] last week.  She will have a bone density done  soon w/ Dr. [name].  FALLS: Has patient fallen in last 6 months? No  LIVING ENVIRONMENT: Lives with: lives with their spouse and live-in nurse [name] Lives in: House/apartment-townhouse Stairs: No-level entry, but multi-level home 4 stories w/ elevator Has following equipment at home: Wheelchair (power), Wheelchair (manual), Grab bars, and standing frame, sliding board, transport shower chair, handheld shower head, leg strap-pt reports she no longer finds this helpful  PLOF: Requires assistive device for independence, Needs assistance with ADLs, Needs assistance with homemaking, and Needs assistance with transfers  OCCUPATION:  Writer-uses Dragon to dictate  PATIENT GOALS: "Make my right leg work."  Stand and pivot so she can more easily access a toilet.  OBJECTIVE:   DIAGNOSTIC FINDINGS: No recent relevant imaging.  Bone density scheduled 10/15/2022.  COGNITION: Overall cognitive status: Within functional limits for tasks assessed   SENSATION: Light touch: Diminished ability to distinguish sharp and dull, but able to distinguish light touch from injury level down accurately  COORDINATION: Not formally assessed.  EDEMA:  Well managed w/ lymphatic massage and compression stockings.  MUSCLE TONE: Pt has intermittent clonus during transfers.  POSTURE: rounded shoulders, posterior pelvic tilt, and right thoracic scoliosis   LOWER EXTREMITY ROM:     Passive  Right 10/20/22 Left 10/20/22  Hip flexion [facility]  Hip extension    Hip abduction    Hip adduction    Hip internal rotation [facility] WFL  Hip external rotation [facility]  Knee flexion [facility] WFL  Knee extension [facility] WFL  Ankle dorsiflexion Slight PF contracture Slight PF contracture  Ankle plantarflexion    Ankle inversion    Ankle eversion     (Blank rows = not tested)    LOWER EXTREMITY MMT:    MMT Right 10/20/22 Left 10/20/22  Hip flexion 1 2+  Hip extension    Hip abduction    Hip adduction    Hip internal rotation     Hip external rotation    Knee flexion 2- 3  Knee extension 2- 3  Ankle dorsiflexion 0 3  Ankle plantarflexion    Ankle inversion    Ankle eversion     (Blank rows = not tested)   BED MOBILITY:  Sit to supine Mod A Supine to sit Mod A Rolling to Right Mod A Rolling to Left Mod A Undulating mattress for wound management on standard bed (elevated-so often doing uphill sliding board transfers); she would like to continue working on sitting up independently, she has been working on rolling, needs less assistance w/ this when someone  props her leg into hooklying; would like something to help her pull her left leg to her butt for stretching as well as bed mobility.  FUNCTIONAL TESTS:  None relevant to pt's current functional level and abilities.  PATIENT SURVEYS:  None completed due to time.  TODAY'S TREATMENT:       TherAct -Pt states they plan to use the stander once received 3-4x a week and SciFit 3x/wk.  She does knee-to-chest, piriformis, hamstring, DF, and quad stretching at home.  Beyond this, PT is focusing on functional progress for transfers and pressure relief for wound healing as pt has established maintenance home routine.  This was discussed with pt with intention to modify or add to formal HEP should patient need this in future. -Seated in EOM w/ feet flat on floor and no back support pt reaches laterally outside BOS approximately 3 inches beyond full arm abduction at 90 degrees w/o LOB.  PT provides SBA for safety.  Pt has more difficulty recovering to midline from right lateral reach. PT positions pt in stander w/ intermittent caregiver assist.  Caregiver is well versed in safety precautions for stander use at home. 100/59, HR 78 bpm prior to stand Pt lifted halfway into standing for BP acclimation and remains asymptomatic in standing.  Able to obtain full upright w/ full LE extension and pt using forearms on tray to stabilize initially. Vitals reassessed:  108/53, HR 81 bpm  following 2 minutes in standing. In standing she performs forward alternating UE flexion > abduction > forward reach over tray bilaterally with return to upright to challenge posterior chain > arms relaxed at side w/ attempts at weight shifting laterally and anterior/posterior > scapular squeezes w/ cues to prevent shoulder shrug > shoulder shrugs > cervical retraction > education to promote postural exercises when using stander at home. Pt stands a total of 16 minutes and 6 seconds prior to return to EOM.   Vitals at end of session:  115/63, HR 83  PATIENT EDUCATION: Education details: Remaining goal assessment with progress noted.  Discussion of focus of future sessions and plan for HEP as needed.  Exercises to perform in stander for postural improvement. Person educated: Patient and Arts administrator Education method: Explanation Education comprehension: verbalized understanding  HOME EXERCISE PROGRAM: Will be established as needed as pt has done continuous therapy and is working towards functional tasks.  GOALS: Goals reviewed with patient? Yes  SHORT TERM GOALS: Target date: 11/12/2022  HEP to be established for stretching and strengthening as needed. Baseline:  Pt has established home routine and adequate caregiver assistance at this time (5/30)  Goal status: IN PROGRESS  2.  Pt will be able to perform rolling L and R w/ no more than minA in order to improve functional mobility. Baseline: modA, min A to setup LE into hookyling (5/28) Goal status: MET  3.  Pt will perform sit<>supine requiring no more than minA in order to improve functional mobility. Baseline: modA, mod A (5/28) Goal status: IN PROGRESS  4.  Pt will perform bilateral reach outside seated BOS with feet supported without LOB in order to improve safety with ADL performance. Baseline: lateral LOB bilaterally demonstrated in w/c; pt able to reach 2-3 inches outside BOS w/ moderate wobble (R worse than L - 5/30) Goal  status: MET  LONG TERM GOALS: Target date: 12/10/2022  Pt will perform squat pivot transfer w/ no more than modA in order to improve access to home environment for toilet transfers. Baseline: minA  for lateral scoot transfer, pt able to clear bottom intermittently Goal status: INITIAL  2.  Pt will perform sit<>supine requiring no more than supervision in order to improve transfers in and out of bed. Baseline:  modA Goal status: INITIAL  3.  Pt will report wound healing in order to return to [facility] program at [facility]. Baseline:  Ischial wound Goal status: INITIAL  4.  Pt will be able to perform rolling L and R w/ no more than supervision in order to improve functional mobility. Baseline: modA Goal status: INITIAL  5.  Pt will perform uphill sliding board transfer w/ no more than modA in order to improve transfer in home environment. Baseline:  To be assessed. Goal status: INITIAL  6.  Pt will perform partial STS in stander or from elevated power w/c to counter w/ no more than modA in order to improve capacity for higher level transfers and access to uneven surfaces. Baseline: To be assessed. Goal status: INITIAL  ASSESSMENT:  CLINICAL IMPRESSION: Patient met 2 of 4 short term goals showing improved lateral stability when reaching outside BOS in unsupported sitting and rolling with minimal assistance.  She continues to need moderate assistance with transferring upright to and from supine, but PT to focus more on this going forward as pt obtains stander and can return to working on erect tolerance more at home vs in clinic.  Focus of session today on completing remaining assessment and continuing stander style tasks to address weight-bearing and thoracic, periscapular and cervical musculature.  She is making progress towards personal and objective goals with PT to continue per POC.  OBJECTIVE IMPAIRMENTS: decreased balance, decreased coordination, decreased mobility, difficulty  walking, decreased ROM, decreased strength, hypomobility, increased edema, impaired flexibility, impaired sensation, impaired tone, impaired UE functional use, improper body mechanics, postural dysfunction, and pain.   ACTIVITY LIMITATIONS: carrying, lifting, bending, sitting, standing, squatting, stairs, transfers, bed mobility, continence, bathing, toileting, dressing, reach over head, hygiene/grooming, locomotion level, and caring for others  PARTICIPATION LIMITATIONS: meal prep, cleaning, laundry, driving, and community activity  PERSONAL FACTORS: Age, Fitness, Past/current experiences, Time since onset of injury/illness/exacerbation, and 1-2 comorbidities: intermittent AD, neurogenic bowel/bladder on active bladder training  are also affecting patient's functional outcome.   REHAB POTENTIAL: Good  CLINICAL DECISION MAKING: Evolving/moderate complexity  EVALUATION COMPLEXITY: Moderate  PLAN:  PT FREQUENCY: 2x/week  PT DURATION: 8 weeks  PLANNED INTERVENTIONS: Therapeutic exercises, Therapeutic activity, Neuromuscular re-education, Balance training, Patient/Family education, Self Care, DME instructions, Electrical stimulation, Wheelchair mobility training, Manual therapy, and Re-evaluation  PLAN FOR NEXT SESSION:  TENS to LE, leg puller, Further assess and address multilevel slide board transfers.  Education on pressure relief for wound healing.  Core strength, static sitting balance-perturbations.  Bed mobility.  Use stander to practice weight-bearing and core as tolerated--monitor BP due to history of OH, can work towards decreased sling support in standing, use of powderboard on mat table for RLE strengthening, wants to work on increasing independence with lateral scoot transfers without SB and also wants to be able to place SB independently and move her LE more independently; use of leg loops/lifters to increase independence with LE management with transfers and bed mobility  Sadie Haber, PT, DPT 11/11/2022, 11:49 AM

## 2022-11-11 NOTE — Therapy (Signed)
OUTPATIENT OCCUPATIONAL THERAPY NEURO TREATMENT  Patient Name: [name] MRN: [id number] DOB:[birth year], 72 y.o., female Today's Date: 11/11/2022  PCP: [name], MD  REFERRING PROVIDER: [name], MD  END OF SESSION:  OT End of Session - 11/11/22 1056     Visit Number 9    Number of Visits 17    Date for OT Re-Evaluation 12/10/22    Authorization Type [employer] - requires auth, [place]    Progress Note Due on Visit 10    OT Start Time 1100    OT Stop Time 1145    OT Time Calculation (min) 45 min    Equipment Utilized During Treatment Patient in [facility]    Activity Tolerance Patient tolerated treatment well    Behavior During Therapy [facility] for tasks assessed/performed              Past Medical History:  Diagnosis Date   CERVICAL POLYP 03/11/2008   Qualifier: Diagnosis of  By: [name] MD, [name]    Colon polyps 2005   on colonscopy [name]   Fibroid 2004   Per Dr. [name]   History of shingles    face and mouth   Hx of skin cancer, basal cell    Rosacea    Sciatica of left side 09/28/2013   Scoliosis    noted on mri done for back pain   Past Surgical History:  Procedure Laterality Date   BUNIONECTOMY     Patient Active Problem List   Diagnosis Date Noted   Buttock wound, left, initial encounter 11/02/2022   Orthostatic hypotension 08/13/2022   Neurogenic bowel 05/03/2022   Spasticity 05/03/2022   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/03/2022   Nerve pain 05/03/2022   Medication monitoring encounter 01/08/2022   Neurogenic bladder 10/11/2021   Urinary incontinence 10/11/2021   ESBL (extended spectrum beta-lactamase) producing bacteria infection 10/09/2021   Recurrent UTI 10/09/2021   Quadriplegia, C5-C7 incomplete (HCC) 01/16/2021   History of spinal fracture 01/16/2021   Suprapubic catheter (HCC) 01/16/2021   Encounter for routine gynecological examination 09/28/2013   Onychomycosis 09/28/2013   Foot deformity, acquired 03/26/2012    Encounter for preventive health examination 12/25/2010   ROSACEA 08/25/2009   Disturbance in sleep behavior 03/11/2008   SKIN CANCER, HX OF 03/11/2008   DYSURIA, HX OF 03/11/2008   Hyperlipidemia 02/10/2007   CERVICALGIA 02/10/2007    ONSET DATE: 07/28/2020  Date of Referral 09/28/22  REFERRING DIAG: G82.54 (ICD-10-CM) - Quadriplegia, C5-C7, incomplete (HCC)  THERAPY DIAG:  Muscle weakness (generalized)  Other lack of coordination  Rationale for Evaluation and Treatment: Rehabilitation  SUBJECTIVE:   SUBJECTIVE STATEMENT: Patient reports wound doctor will reassess wound next Tuesday to determine next course ie) wound vac or imaging based on drainage.  Pt and caregiver brought an extensive typed list of items they have at home for "therapy" including her own e-stim unit.  Patient asked for some additional settings and pad placements for her e-stim unit also to help with grip and pinch as she only used in in the "T-setting ie) MCP flexion with pad on forearm and in palm.   Pt accompanied by:  Live in Caregiver - [name]   PERTINENT HISTORY: "Pt is a 72 yr old L handed female with hx of incomplete quadriplegia- 2/14 2022- fleeing the police in [place] on passenger 100 (high speed) miles/hour,  [name]; neurogenic bowel and bladder and spasticity; [name], [name] Here for f/u on Incomplete quadriplegia"  Referral from MD 09/28/22 states, "Please eval and treat for ADLs and higher level mobility."  PRECAUTIONS: Fall; suprapubic catheter (she wants to get this removed meaning she needs to get to and from the toilet); she has had minor heat sensation when needing to complete her bowel program-possible AD?   WEIGHT BEARING RESTRICTIONS: No-pt was in stander at most recent therapy in [place] last week.  She will have a bone density done soon w/ Dr. [name].   PAIN:  Patient reports chronic pain 3/10 fingers to elbow bilaterally managed by medication and will inform  therapy staff of any changes.  FALLS: Has patient fallen in last 6 months? No  LIVING ENVIRONMENT: Lives with: lives with their family - husband [name] and with an adult companion s/p moving back up from [place] x10 months Lives in: House/apartment Stairs:  4 story town house with an Engineer, structural with threshold adjustments, roll in shower with transport chair Has following equipment at home: Wheelchair (power) - with seat height adjustments to access counters and reclining option, Wheelchair (manual), transport WC, shower chair, and Ramped entry, handheld showerhead with rails around toilet, had Michiel Sites but is no longer in need of it, has slide boards x3  PLOF: Requires assistive device for independence, Needs assistance with ADLs, Needs assistance with homemaking, Needs assistance with gait, and Needs assistance with transfers; 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  Used to like to knit, sew and bake.  PATIENT GOALS: Wants to be able to type - currently using advanced Dragon dictation at times but prefers to type at times.  She would like to be able to write better and is interested in resuming some leisure activities such as Archivist and baking.  She also wants to keep working on being able to cut her own food and on her UE strength.   OBJECTIVE:   HAND DOMINANCE: Left  ADLs: Overall ADLs: Patient has a live in caregiver  Transfers/ambulation related to ADLs: Mod assist with sliding board transfers (previously [employer] lift).  Eating: Has a rocker knife that she can use. Used to use adapted utensils but now uses regular utensils but still will get assistance to cut food ie) when eating out.  Grooming: can brush her own hair but unable to manage jewelry ie) earrings  UB Dressing: can zip/unzip after it has been started, unable to manage buttons herself, Caregiver assists but if she has extra time, she can put on her bra, and a loose fitting pullover shirt/t-shirt  LB Dressing:  dependent for LB dressing in bed and with special sock donner for LE compression garments   Toileting: bladder trained with suprapubic catheter which she clamps off.  Dependent for bowel incontinence care.  Bathing: Sponge bath with adult washclothes.  Can bath UB with back scrubber for most of her back.  Needs help with feet (mentioned she might need a separate brush for feet)   Tub Shower transfers: Min-mod assist with slide board  Equipment: Shower seat with back, Walk in shower, bed side commode, Reacher, Sock aid, Long handled sponge, and Feeding equipment  IADLs: --  Shopping: Assisted by caregiver  Light housekeeping: Has housekeeper that comes monthly  Meal Prep: previously enjoyed baking. Assisted by caregiver but described recent success at reheating a meal for herself after getting food out of the fridge/freezer from her WC.  Community mobility: Dependent  Medication management: Caregiver sorts them into pillbox but she is very aware of her medications   Financial management: Patient  manages her own finances  Handwriting: Increased time and has a pen with a little grip  MOBILITY STATUS: Independent with power mobiity  POSTURE COMMENTS:  No Significant postural limitations and forward head Sitting balance: Supports self independently with both Ues  ACTIVITY TOLERANCE: Activity tolerance: Fair - MMT WFL but has limited sustained tolerance for ongoing use of UEs  FUNCTIONAL OUTCOME MEASURES: 10/20/22 QuickDash 31.8 points   UPPER EXTREMITY ROM:   AROM - WFL without obvious contractures, some digital flexion noted but PROM WNL   AROM Right (eval) Left (eval)  Shoulder flexion [facility]  Shoulder abduction [facility]  Elbow flexion [facility] WFL  Elbow extension [facility]  Wrist flexion [facility] WFL  Wrist extension WFL WFL  Ulnar deviation WFL Decreased ulnar  deviation past midline  Wrist pronation [facility] WFL  Wrist supination [facility]  Digit Composite Flexion Lacks full AROM:    1st digit - 5 cm   3rd digit -1 cm   4th digit - 2 cm  Lacks full AROM:   1st digit - 1.5 cm  5th digit - 3 cm   Digit Composite Extension [facility]  Digit Opposition Opposition to index finger only Lacks to pinkie due to limited DIP pinkie flexion  (Blank rows = not tested)  UPPER EXTREMITY MMT:   Grossly WFL - Endurance limited R tricep strength > than L but L UE generally stronger than R UE  MMT Right (eval) Left (eval)  Shoulder flexion 4/5 4/5  Shoulder abduction 4/5 4/5  Elbow flexion 4/5 4/5  Elbow extension 4/5 4-/5  (Blank rows = not tested)  HAND FUNCTION: Grip strength: Right: 4.8 lbs; Left: 20.9 lbs  COORDINATION: Finger Nose Finger test: R generally WFL, L WNL Box and Blocks:  Right 28 blocks, Left 37 blocks R hand finger eventually cramps and dexterity worsens in the cold  SENSATION: Light touch: Impaired  - patient   EDEMA: NA for UEs but LE has poor lymph drainage with custom compression garments   MUSCLE TONE: Generally WFL but will assess further  COGNITION: Overall cognitive status: Within functional limits for tasks assessed  VISION: Subjective report: Patent wears progressive lens/glasses.  Denies diplopia or vision changes but has eye exam in the next couple of months. Baseline vision: Wears glasses all the time  VISION ASSESSMENT: WFL  OBSERVATIONS: Patient independent with power WC navigation within clinic.  Patient is well-kept with foley catheter in place.  She has slight limitations in full extension of digits but PROM is WNL and she has splints at home that she said she will bring for OT staff to assess.  She has functional ROM of B UEs to reach her head, behind her back and to cross midline to assist with ADLs.     TODAY'S TREATMENT:  Therapeutic activities -   Pt and caregiver brought an extensive typed  list of items they have at home for "therapy" including: Weights, active hand gloves, putty, Therabands, gripper with rubber bands, pinch clippers, 25 hold pegboard, nine-hole fine motor set, foam blocks, 1 kg weighted ball, connect 4 pieces, Thera ball, flat minute, hand massage vibrating ball, hand warmers, yellow Thera bar, coins and slider jar for them, playing cards, adapted knife, knitting needles and yarn as well as her own e-stim unit.   Therapist had prepared a list of categories to sort exercises and activities to review with patient ie)  Shoulders/Elbows (triceps) (WC pushups & Theraband - for strengthening these areas) And then  Hands - divided into different categories with list above reviewed and suggestions made for each area including   Grasp/Pinch (putty, pegboards, cards etc)  Typing  Fine Motor  Self care     Tying laces   Cutting food  Patient education provided on working on activities at convenient times ie) laying in bed, riding in vehicle etc so that functional tasks can be done as needed throughout the day ie) typing and knitting.  Patient encouraged to bring her knitting needles to assess modifications needed and activities that will help improve coordination for leisure tasks.  Therapeutic Exercises - While reviewing exercises, patient asked for help with strengthening her triceps.  She is encouraged to work on [employer] pushups multiple times/day including tracking how many she does/day.  Theraband Exercises demonstrated and printed for her to work on in bed or chair.   - Seated Elbow Extension with Self-Anchored Resistance - video reviewed & demonstrated - Supine Triceps Extension with Resistance - video reviewed & demonstrated - Seated Bilateral Elbow Extension  - video reviewed, demonstrated & then practiced with theraband held behind her shoulder    PATIENT EDUCATION: Education details: Reviewed UE activities to work on person educated: Patient and Software engineer in  caregiver - [name] Education method: Explanation, Demonstration, Tactile cues, and Verbal cues,  Education comprehension: verbalized understanding, returned demonstration, and needs further education Patient desires specific list of exercises by groupings.  HOME EXERCISE PROGRAM: [email or web address] 10/27/22 - Theraputty  Access Code: [email or web address]  11/11/22 - Elbow Extension Access Code: [email or web address]   GOALS:   SHORT TERM GOALS: Target date: 11/09/22  Patient will be able to use AE/modified techniques to cut soft foods small enough for oral intake. Baseline: Caregiver/spouse assist Goal status: IN PROGRESS  2.  Patient will be able to use AE/modified positioning to complete word search by drawing lines through words with 0 errors. Baseline: TBD Goal status: INITIAL  3.  Patient will verbalize understanding of good pressure relief schedule (for 15 to 60 seconds every 15 to 60 minutes) to help with wound healing. Baseline: Patient did not change positioning in > 45 minutes of OT eval. Goal status: IN PROGRESS  4.  Patient will be assisted to explore modifications for leisure tasks (knit/sew/bake) with good return demonstration. Baseline: Minimal involvement Goal status: IN PROGRESS  5.  Patient will demonstrate independence with HEP for UE strengthening, coordination and ROM to prevent contractures and maintain strength for transfers and ADLs. Baseline: Previous HEPs have been established but need to be reviewed and updated.  Goal status: IN PROGRESS  6.  Patient will be assessed for typing speed/dexterity. Baseline: Patient reports difficulty with typing with all her fingers. Goal status: IN PROGRESS  7. Patient will complete Quick Dash UE assessment. Baseline: TBD Goal Status: 10/20/22 Discontinued - see scores above  LONG TERM GOALS: Target date: 12/10/22  Patient will complete 1 small craft/week r/t her leisure interests to work on FMS daily. Baseline: Minimal  involvement Goal status: IN PROGRESS  2.  Patient will improve B coordination for increased typing speed/dexterity x 1-2 WPM. Baseline: TBD Goal status: IN PROGRESS  3.  Patient will improve B UE coordination, strength and functional use to bake cookies with setup assistance and AE/modified techniques as appropriate.  Baseline: Not performed. Goal status: IN PROGRESS  4.  Patient will report no more than moderate difficulty using a knife to foods such as chicken, small enough for oral intake using AE and strategies as needed. Baseline: Caregiver/spouse assist Goal status: IN PROGRESS  5.  Patient will be able to use AE/modified positioning to complete 4 sentences with 100% legibility. Baseline: Subjective reports of difficulty with writing Goal status: INITIAL   ASSESSMENT:  CLINICAL IMPRESSION: Patient demonstrates understanding of HEP but will benefit from organization, additional handouts and match the list of items she has to activities for her to do.  Patient also encouraged to work on "functional activities" ie) knitting with encouragement to bring items to therapy to assess. Patient is benefiting from skilled OT services in the outpatient setting to work on UE impairments to help pt return to higher level of function as able.     PERFORMANCE DEFICITS: in functional skills including ADLs, IADLs, coordination, dexterity, strength, muscle spasms, Fine motor control, Gross motor control, continence, skin integrity, and UE functional use,   IMPAIRMENTS: are limiting patient from ADLs, IADLs, work, and leisure.   CO-MORBIDITIES: has co-morbidities such as incontinence and wound  that affects occupational performance. Patient will benefit from skilled OT to address above impairments and improve overall function.  REHAB POTENTIAL: Fair due to chronicity of injury   PLAN:  OT FREQUENCY: 2x/week  OT DURATION: 8 weeks  PLANNED INTERVENTIONS: self care/ADL training, therapeutic  exercise, therapeutic activity, neuromuscular re-education, manual therapy, passive range of motion, balance training, functional mobility training, splinting, patient/family education, energy conservation, coping strategies training, and DME and/or AE instructions  RECOMMENDED OTHER SERVICES: Patient was seen for PT evaluation today with treatment plans coordinated for 2x/week.  CONSULTED AND AGREED WITH PLAN OF CARE: Patient and family member/caregiver  PLAN FOR NEXT SESSION:  *PROGRESS REPORT - Need to review OT goals for modifications and suggestions continued.   Continue to review BUE HEP (by category) including administration of HEP calendar. May use (.pdexchart) for template.    Continue FM tasks to assess and improve typing and simple hobby/craft activities (ie baking, yarn activities).    Victorino Sparrow, OT 11/11/2022, 3:55 PM

## 2022-11-15 ENCOUNTER — Ambulatory Visit: Payer: Medicare PPO | Admitting: Physical Therapy

## 2022-11-15 ENCOUNTER — Ambulatory Visit (HOSPITAL_BASED_OUTPATIENT_CLINIC_OR_DEPARTMENT_OTHER): Payer: Medicare PPO | Admitting: Internal Medicine

## 2022-11-15 ENCOUNTER — Ambulatory Visit: Payer: Medicare PPO | Admitting: Occupational Therapy

## 2022-11-15 NOTE — Telephone Encounter (Signed)
Yes please do referral for osteoporosis and quadriparesis  wheel chair bound

## 2022-11-16 ENCOUNTER — Encounter (HOSPITAL_BASED_OUTPATIENT_CLINIC_OR_DEPARTMENT_OTHER): Payer: Medicare PPO | Attending: Internal Medicine | Admitting: Internal Medicine

## 2022-11-16 DIAGNOSIS — L89323 Pressure ulcer of left buttock, stage 3: Secondary | ICD-10-CM | POA: Insufficient documentation

## 2022-11-16 DIAGNOSIS — G8250 Quadriplegia, unspecified: Secondary | ICD-10-CM | POA: Insufficient documentation

## 2022-11-17 ENCOUNTER — Ambulatory Visit
Admission: RE | Admit: 2022-11-17 | Discharge: 2022-11-17 | Disposition: A | Discharge status: Home / Self Care | Payer: Medicare PPO | Source: Ambulatory Visit | Attending: Internal Medicine | Admitting: Internal Medicine

## 2022-11-17 ENCOUNTER — Ambulatory Visit: Payer: Medicare PPO | Admitting: Physical Therapy

## 2022-11-17 ENCOUNTER — Encounter: Payer: Self-pay | Admitting: Podiatry

## 2022-11-17 ENCOUNTER — Ambulatory Visit: Payer: Medicare PPO | Admitting: Podiatry

## 2022-11-17 ENCOUNTER — Ambulatory Visit: Payer: Medicare PPO | Attending: Physical Medicine and Rehabilitation | Admitting: Occupational Therapy

## 2022-11-17 DIAGNOSIS — M24541 Contracture, right hand: Secondary | ICD-10-CM | POA: Diagnosis not present

## 2022-11-17 DIAGNOSIS — R29818 Other symptoms and signs involving the nervous system: Secondary | ICD-10-CM | POA: Insufficient documentation

## 2022-11-17 DIAGNOSIS — R600 Localized edema: Secondary | ICD-10-CM

## 2022-11-17 DIAGNOSIS — R208 Other disturbances of skin sensation: Secondary | ICD-10-CM | POA: Diagnosis present

## 2022-11-17 DIAGNOSIS — R2689 Other abnormalities of gait and mobility: Secondary | ICD-10-CM | POA: Insufficient documentation

## 2022-11-17 DIAGNOSIS — M24542 Contracture, left hand: Secondary | ICD-10-CM | POA: Diagnosis not present

## 2022-11-17 DIAGNOSIS — B351 Tinea unguium: Secondary | ICD-10-CM

## 2022-11-17 DIAGNOSIS — R293 Abnormal posture: Secondary | ICD-10-CM

## 2022-11-17 DIAGNOSIS — G8254 Quadriplegia, C5-C7 incomplete: Secondary | ICD-10-CM | POA: Diagnosis not present

## 2022-11-17 DIAGNOSIS — Z1231 Encounter for screening mammogram for malignant neoplasm of breast: Secondary | ICD-10-CM | POA: Diagnosis not present

## 2022-11-17 DIAGNOSIS — M79674 Pain in right toe(s): Secondary | ICD-10-CM | POA: Diagnosis not present

## 2022-11-17 DIAGNOSIS — M6281 Muscle weakness (generalized): Secondary | ICD-10-CM

## 2022-11-17 DIAGNOSIS — R278 Other lack of coordination: Secondary | ICD-10-CM | POA: Diagnosis not present

## 2022-11-17 DIAGNOSIS — G8253 Quadriplegia, C5-C7 complete: Secondary | ICD-10-CM

## 2022-11-17 DIAGNOSIS — L603 Nail dystrophy: Secondary | ICD-10-CM | POA: Diagnosis not present

## 2022-11-17 DIAGNOSIS — M79675 Pain in left toe(s): Secondary | ICD-10-CM | POA: Diagnosis not present

## 2022-11-17 DIAGNOSIS — R29898 Other symptoms and signs involving the musculoskeletal system: Secondary | ICD-10-CM | POA: Diagnosis present

## 2022-11-17 DIAGNOSIS — L89323 Pressure ulcer of left buttock, stage 3: Secondary | ICD-10-CM | POA: Diagnosis not present

## 2022-11-17 NOTE — Therapy (Signed)
OUTPATIENT PHYSICAL THERAPY NEURO TREATMENT   Patient Name: [name] MRN: [id number] DOB:1950-11-21, 72 y.o., female Today's Date: 11/17/2022   PCP: [name], MD REFERRING PROVIDER: [name], MD   END OF SESSION:  PT End of Session - 11/17/22 0848     Visit Number 10    Number of Visits 17   16 + eval   Date for PT Re-Evaluation 12/10/22    Authorization Type [employer] MEDICARE    Progress Note Due on Visit 20    PT Start Time 0845    PT Stop Time 0930    PT Time Calculation (min) 45 min    Equipment Utilized During Treatment Gait belt    Activity Tolerance Patient tolerated treatment well    Behavior During Therapy WFL for tasks assessed/performed              Past Medical History:  Diagnosis Date   CERVICAL POLYP 03/11/2008   Qualifier: Diagnosis of  By: [name] MD, [name]    Colon polyps 2005   on colonscopy [name]   Fibroid 2004   Per Dr. [name]   History of shingles    face and mouth   Hx of skin cancer, basal cell    Rosacea    Sciatica of left side 09/28/2013   Scoliosis    noted on mri done for back pain   Past Surgical History:  Procedure Laterality Date   BUNIONECTOMY     Patient Active Problem List   Diagnosis Date Noted   Buttock wound, left, initial encounter 11/02/2022   Orthostatic hypotension 08/13/2022   Neurogenic bowel 05/03/2022   Spasticity 05/03/2022   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/03/2022   Nerve pain 05/03/2022   Medication monitoring encounter 01/08/2022   Neurogenic bladder 10/11/2021   Urinary incontinence 10/11/2021   ESBL (extended spectrum beta-lactamase) producing bacteria infection 10/09/2021   Recurrent UTI 10/09/2021   Quadriplegia, C5-C7 incomplete (HCC) 01/16/2021   History of spinal fracture 01/16/2021   Suprapubic catheter (HCC) 01/16/2021   Encounter for routine gynecological examination 09/28/2013   Onychomycosis 09/28/2013   Foot deformity, acquired 03/26/2012   Encounter for preventive  health examination 12/25/2010   ROSACEA 08/25/2009   Disturbance in sleep behavior 03/11/2008   SKIN CANCER, HX OF 03/11/2008   DYSURIA, HX OF 03/11/2008   Hyperlipidemia 02/10/2007   CERVICALGIA 02/10/2007    ONSET DATE: 09/28/2022 (most recent referral)  REFERRING DIAG: G82.54 (ICD-10-CM) - Quadriplegia, C5-C7, incomplete (HCC)  THERAPY DIAG:  Muscle weakness (generalized)  Abnormal posture  Quadriplegia, C5-C7 incomplete (HCC)  Quadriplegia, C5-C7 complete (HCC)  Other abnormalities of gait and mobility  Rationale for Evaluation and Treatment: Rehabilitation  SUBJECTIVE:  SUBJECTIVE STATEMENT: Pt now with a wound vac to wound on her L glute and hip area. Pt reports she is getting her new power wheelchair today.  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 Yes: NPRS scale: 3/10 Pain location: forearms to fingertips Pain description: constant, pinprick/tingling Aggravating factors: nighttime Relieving factors: nothing, sometimes medicines  PRECAUTIONS: Fall; suprapubic catheter (she wants to get this removed meaning she needs to get to and from the toilet); she has had minor heat sensation when needing to complete her bowel program-possible AD?  WEIGHT BEARING RESTRICTIONS: No-pt was in stander at most recent therapy in [place] last week.  She will have a bone density done soon w/ Dr. [name].  FALLS: Has patient fallen in last 6 months? No  LIVING ENVIRONMENT: Lives with: lives with their spouse and live-in nurse [name] Lives in: House/apartment-townhouse Stairs: No-level entry, but multi-level home 4 stories w/ elevator Has following equipment at home:  Wheelchair (power), Wheelchair (manual), Grab bars, and standing frame, sliding board, transport shower chair, handheld shower head, leg strap-pt reports she no longer finds this helpful  PLOF: Requires assistive device for independence, Needs assistance with ADLs, Needs assistance with homemaking, and Needs assistance with transfers  OCCUPATION:  Writer-uses Dragon to dictate  PATIENT GOALS: "Make my right leg work."  Stand and pivot so she can more easily access a toilet.  OBJECTIVE:   DIAGNOSTIC FINDINGS: No recent relevant imaging.  Bone density scheduled 10/15/2022.  COGNITION: Overall cognitive status: Within functional limits for tasks assessed   SENSATION: Light touch: Diminished ability to distinguish sharp and dull, but able to distinguish light touch from injury level down accurately  COORDINATION: Not formally assessed.  EDEMA:  Well managed w/ lymphatic massage and compression stockings.  MUSCLE TONE: Pt has intermittent clonus during transfers.  POSTURE: rounded shoulders, posterior pelvic tilt, and right thoracic scoliosis   LOWER EXTREMITY ROM:     Passive  Right 10/20/22 Left 10/20/22  Hip flexion [facility]  Hip extension    Hip abduction    Hip adduction    Hip internal rotation [facility] WFL  Hip external rotation [facility]  Knee flexion [facility] WFL  Knee extension [facility] WFL  Ankle dorsiflexion Slight PF contracture Slight PF contracture  Ankle plantarflexion    Ankle inversion    Ankle eversion     (Blank rows = not tested)    LOWER EXTREMITY MMT:    MMT Right 10/20/22 Left 10/20/22  Hip flexion 1 2+  Hip extension    Hip abduction    Hip adduction    Hip internal rotation    Hip external rotation    Knee flexion 2- 3  Knee extension 2- 3  Ankle dorsiflexion 0 3  Ankle plantarflexion    Ankle inversion    Ankle eversion     (Blank rows = not tested)   BED MOBILITY:  Sit to supine Mod A Supine to sit Mod A Rolling to Right Mod A Rolling to Left Mod  A Undulating mattress for wound management on standard bed (elevated-so often doing uphill sliding board transfers); she would like to continue working on sitting up independently, she has been working on rolling, needs less assistance w/ this when someone props her leg into hooklying; would like something to help her pull her left leg to her butt for stretching as  well as bed mobility.  FUNCTIONAL TESTS:  None relevant to pt's current functional level and abilities.  PATIENT SURVEYS:  None completed due to time.  TODAY'S TREATMENT:       TherAct Pt presents seated in her PWC. Slide board transfer PWC to mat table with setup A for placement of slide board and min A for adjustment of LE during transfer. Pt asking this therapist to assess 2 different w/c cushions that she has (one for her manual w/c and one for her PWC) to determine which one pt would benefit from more. Pt presents with a J2 cushion and a J Fusion cushion. J2 cushion determined to be composed of solid foam whereas J Fusion is a less dense foam and is slightly more pliable, both cushions with gel portion under buttocks/sacarum. Discussed pros/cons of each cushion including stability for sitting balance and transfers, decreased pressure on LE with softer foam, etc. Decision left up to patient on which cushion she would prefer to use after pro/con discussion though she would likely be more comfortable with J Fusion cushion.  Remainder of session focused on supine to/from sit and rolling L/R with use of leg lifter (blue lifter) for LE management. Pt does require mod A for sit to supine for BLE management for time and energy conservation during session. Pt needs assist to place leg lifter on her foot due to length of device. Once leg lifter placed on LLE pt is able to bring her LLE into hooklying position and then roll onto her R side. Pt does have difficulty keeping L knee in flexed position during transfer as well as needs assist to  place/remove pillows under her head during various points of transfer. Discussed pros/cons of pt being able to perform these types of transfers independently due to assist still needed for placement of leg lifter and of pillows during transfer.  Supine to sit via log roll, assisted pt with rolling to her R in similar manner as noted above. Pt able to utilize her LLE to advance her RLE towards EOM but ultimately requires placement of leg lifter on her RLE and then she is able to utilize this to assist with bringing her LLE off edge of mat. Pt then able to utilize arm of sturdy chair to elevate her trunk and utilize pressing up on mat table with triceps to bring trunk into sitting position. Pt can benefit from continued practice of this type of transfer as she shows improvement. Pt transferred back to her PWC at end of session with assist from [place] in similar manner as noted above. Pt handed off to OT.  PATIENT EDUCATION: Education details: edu on pros/cons of J2 vs J Fusion cushions, education on how to utilize leg lifter to assist with LE management during bed mobility Person educated: Patient and Caregiver [name] Education method: Explanation Education comprehension: verbalized understanding  HOME EXERCISE PROGRAM: Will be established as needed as pt has done continuous therapy and is working towards functional tasks.  GOALS: Goals reviewed with patient? Yes  SHORT TERM GOALS: Target date: 11/12/2022  HEP to be established for stretching and strengthening as needed. Baseline:  Pt has established home routine and adequate caregiver assistance at this time (5/30)  Goal status: IN PROGRESS  2.  Pt will be able to perform rolling L and R w/ no more than minA in order to improve functional mobility. Baseline: modA, min A to setup LE into hookyling (5/28) Goal status: MET  3.  Pt will perform  sit<>supine requiring no more than minA in order to improve functional mobility. Baseline: modA, mod A  (5/28) Goal status: IN PROGRESS  4.  Pt will perform bilateral reach outside seated BOS with feet supported without LOB in order to improve safety with ADL performance. Baseline: lateral LOB bilaterally demonstrated in w/c; pt able to reach 2-3 inches outside BOS w/ moderate wobble (R worse than L - 5/30) Goal status: MET  LONG TERM GOALS: Target date: 12/10/2022  Pt will perform squat pivot transfer w/ no more than modA in order to improve access to home environment for toilet transfers. Baseline: minA for lateral scoot transfer, pt able to clear bottom intermittently Goal status: INITIAL  2.  Pt will perform sit<>supine requiring no more than supervision in order to improve transfers in and out of bed. Baseline:  modA Goal status: INITIAL  3.  Pt will report wound healing in order to return to day program at [facility]. Baseline:  Ischial wound Goal status: INITIAL  4.  Pt will be able to perform rolling L and R w/ no more than supervision in order to improve functional mobility. Baseline: modA Goal status: INITIAL  5.  Pt will perform uphill sliding board transfer w/ no more than modA in order to improve transfer in home environment. Baseline:  To be assessed. Goal status: INITIAL  6.  Pt will perform partial STS in stander or from elevated power w/c to counter w/ no more than modA in order to improve capacity for higher level transfers and access to uneven surfaces. Baseline: To be assessed. Goal status: INITIAL  ASSESSMENT:  CLINICAL IMPRESSION: Emphasis of skilled PT session on discussing 2 types of cushions with patient and her caregiver to determine which one patient will get the most benefit from as well as continuing to work on increasing independence with bed mobility with use of leg lifter this session. Pt exhibits improved ability to perform supine to sit transfer more independently this session with use of leg lifter, can benefit from continued practice of this.  Pt continues to benefit from skilled therapy services to work towards improving her safety and independence with functional mobility. Continue POC.   OBJECTIVE IMPAIRMENTS: decreased balance, decreased coordination, decreased mobility, difficulty walking, decreased ROM, decreased strength, hypomobility, increased edema, impaired flexibility, impaired sensation, impaired tone, impaired UE functional use, improper body mechanics, postural dysfunction, and pain.   ACTIVITY LIMITATIONS: carrying, lifting, bending, sitting, standing, squatting, stairs, transfers, bed mobility, continence, bathing, toileting, dressing, reach over head, hygiene/grooming, locomotion level, and caring for others  PARTICIPATION LIMITATIONS: meal prep, cleaning, laundry, driving, and community activity  PERSONAL FACTORS: Age, Fitness, Past/current experiences, Time since onset of injury/illness/exacerbation, and 1-2 comorbidities: intermittent AD, neurogenic bowel/bladder on active bladder training  are also affecting patient's functional outcome.   REHAB POTENTIAL: Good  CLINICAL DECISION MAKING: Evolving/moderate complexity  EVALUATION COMPLEXITY: Moderate  PLAN:  PT FREQUENCY: 2x/week  PT DURATION: 8 weeks  PLANNED INTERVENTIONS: Therapeutic exercises, Therapeutic activity, Neuromuscular re-education, Balance training, Patient/Family education, Self Care, DME instructions, Electrical stimulation, Wheelchair mobility training, Manual therapy, and Re-evaluation  PLAN FOR NEXT SESSION:  TENS or NMES to LE, leg puller for bed mobility, Further assess and address multilevel slide board transfers.  Education on pressure relief for wound healing.  Core strength, static sitting balance-perturbations.  Bed mobility.  Use stander to practice weight-bearing and core as tolerated--monitor BP due to history of OH, can work towards decreased sling support in standing, use of powderboard on mat  table for RLE strengthening, wants  to work on increasing independence with lateral scoot transfers without SB and also wants to be able to place SB independently and move her LE more independently; use of leg loops/lifters to increase independence with LE management with transfers and bed mobility  Peter Congo, PT, DPT, CSRS 11/17/2022, 9:34 AM

## 2022-11-17 NOTE — Patient Instructions (Signed)
More silicone pads can be purchased from:  [email or web address]  

## 2022-11-17 NOTE — Therapy (Signed)
OUTPATIENT OCCUPATIONAL THERAPY NEURO TREATMENT & PROGRESS NOTE  Patient Name: AHSLEE GUIDROZ MRN: [id number] DOB:1951-02-06, 72 y.o., female Today's Date: 11/17/2022  PCP: [name], MD  REFERRING PROVIDER: [name], MD  END OF SESSION:  OT End of Session - 11/17/22 0916     Visit Number 10    Number of Visits 17    Date for OT Re-Evaluation 12/10/22    Authorization Type [employer] - requires auth, [place]    Progress Note Due on Visit 10    OT Start Time 0930    OT Stop Time 1015    OT Time Calculation (min) 45 min    Equipment Utilized During Treatment Patient in Power [facility]    Activity Tolerance Patient tolerated treatment well    Behavior During Therapy [facility] for tasks assessed/performed              Past Medical History:  Diagnosis Date   CERVICAL POLYP 03/11/2008   Qualifier: Diagnosis of  By: [name] MD, [name]    Colon polyps 2005   on colonscopy [name]   Fibroid 2004   Per Dr. [name]   History of shingles    face and mouth   Hx of skin cancer, basal cell    Rosacea    Sciatica of left side 09/28/2013   Scoliosis    noted on mri done for back pain   Past Surgical History:  Procedure Laterality Date   BUNIONECTOMY     Patient Active Problem List   Diagnosis Date Noted   Buttock wound, left, initial encounter 11/02/2022   Orthostatic hypotension 08/13/2022   Neurogenic bowel 05/03/2022   Spasticity 05/03/2022   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/03/2022   Nerve pain 05/03/2022   Medication monitoring encounter 01/08/2022   Neurogenic bladder 10/11/2021   Urinary incontinence 10/11/2021   ESBL (extended spectrum beta-lactamase) producing bacteria infection 10/09/2021   Recurrent UTI 10/09/2021   Quadriplegia, C5-C7 incomplete (HCC) 01/16/2021   History of spinal fracture 01/16/2021   Suprapubic catheter (HCC) 01/16/2021   Encounter for routine gynecological examination 09/28/2013   Onychomycosis 09/28/2013   Foot deformity, acquired  03/26/2012   Encounter for preventive health examination 12/25/2010   ROSACEA 08/25/2009   Disturbance in sleep behavior 03/11/2008   SKIN CANCER, HX OF 03/11/2008   DYSURIA, HX OF 03/11/2008   Hyperlipidemia 02/10/2007   CERVICALGIA 02/10/2007    ONSET DATE: 07/28/2020  Date of Referral 09/28/22  REFERRING DIAG: G82.54 (ICD-10-CM) - Quadriplegia, C5-C7, incomplete (HCC)  THERAPY DIAG:  Muscle weakness (generalized)  Other lack of coordination  Rationale for Evaluation and Treatment: Rehabilitation  SUBJECTIVE:   SUBJECTIVE STATEMENT:  Patient reports wound doctor did apply the wound vac yesterday and will reassess wound next Tuesday.  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   Pt accompanied by:  Live in Caregiver - [name]   PERTINENT HISTORY: "Pt is a 72 yr old L handed female with hx of incomplete quadriplegia- 2/14 2022- fleeing the police in [place] on passenger 100 (high speed) miles/hour,  [name]; neurogenic bowel and bladder and spasticity; [name], [name] Here for f/u on Incomplete quadriplegia"  Referral from MD 09/28/22 states, "Please eval and treat for ADLs and higher level mobility."  PRECAUTIONS: Fall; suprapubic catheter (she wants to get this  removed meaning she needs to get to and from the toilet); she has had minor heat sensation when needing to complete her bowel program-possible AD?   WEIGHT BEARING RESTRICTIONS: No-pt was in stander at most recent therapy in [place] last week.  She will have a bone density done soon w/ Dr. [name].   PAIN:  Patient reports chronic pain 3/10 fingers to elbow bilaterally managed by medication and will inform therapy staff of any changes.  FALLS: Has patient fallen in last 6 months? No  LIVING ENVIRONMENT: Lives with: lives with their  family - husband [name] and with an adult companion s/p moving back up from [place] x10 months Lives in: House/apartment Stairs:  4 story town house with an Engineer, structural with threshold adjustments, roll in shower with transport chair Has following equipment at home: Wheelchair (power) - with seat height adjustments to access counters and reclining option, Wheelchair (manual), transport WC, shower chair, and Ramped entry, handheld showerhead with rails around toilet, had Michiel Sites but is no longer in need of it, has slide boards x3  PLOF: Requires assistive device for independence, Needs assistance with ADLs, Needs assistance with homemaking, Needs assistance with gait, and Needs assistance with transfers; 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  Used to like to knit, sew and bake.  PATIENT GOALS: Wants to be able to type - currently using advanced Dragon dictation at times but prefers to type at times.  She would like to be able to write better and is interested in resuming some leisure activities such as Archivist and baking.  She also wants to keep working on being able to cut her own food and on her UE strength.   OBJECTIVE:   HAND DOMINANCE: Left  ADLs: Overall ADLs: Patient has a live in caregiver  Transfers/ambulation related to ADLs: Mod assist with sliding board transfers (previously [employer] lift).  Eating: Has a rocker knife that she can use. Used to use adapted utensils but now uses regular utensils but still will get assistance to cut food ie) when eating out.  Grooming: can brush her own hair but unable to manage jewelry ie) earrings  UB Dressing: can zip/unzip after it has been started, unable to manage buttons herself, Caregiver assists but if she has extra time, she can put on her bra, and a loose fitting pullover shirt/t-shirt  LB Dressing: dependent for LB dressing in bed and with special sock donner for LE compression garments   Toileting: bladder trained with  suprapubic catheter which she clamps off.  Dependent for bowel incontinence care.  Bathing: Sponge bath with adult washclothes.  Can bath UB with back scrubber for most of her back.  Needs help with feet (mentioned she might need a separate brush for feet)   Tub Shower transfers: Min-mod assist with slide board  Equipment: Shower seat with back, Walk in shower, bed side commode, Reacher, Sock aid, Long handled sponge, and Feeding equipment  IADLs: --  Shopping: Assisted by caregiver  Light housekeeping: Has housekeeper that comes monthly  Meal Prep: previously enjoyed baking. Assisted by caregiver but described recent success at reheating a meal for herself after getting food out of the fridge/freezer from her WC.  Community mobility: Dependent  Medication management: Caregiver sorts them into pillbox but she is very aware of her medications   Financial management: Patient manages her own finances  Handwriting: Increased time and has a pen with a little grip  MOBILITY STATUS: Independent with power mobiity  POSTURE  COMMENTS:  No Significant postural limitations and forward head Sitting balance: Supports self independently with both Ues  ACTIVITY TOLERANCE: Activity tolerance: Fair - MMT WFL but has limited sustained tolerance for ongoing use of UEs  FUNCTIONAL OUTCOME MEASURES: 10/20/22 QuickDash 31.8 points   UPPER EXTREMITY ROM:   AROM - WFL without obvious contractures, some digital flexion noted but PROM WNL   AROM Right (eval) Left (eval)  Shoulder flexion [facility]  Shoulder abduction [facility]  Elbow flexion [facility] WFL  Elbow extension [facility]  Wrist flexion [facility] WFL  Wrist extension WFL WFL  Ulnar deviation WFL Decreased ulnar  deviation past midline  Wrist pronation [facility] WFL  Wrist supination [facility]  Digit Composite Flexion Lacks full AROM:   1st digit - 5 cm   3rd digit -1 cm   4th digit - 2 cm  Lacks full AROM:   1st digit - 1.5 cm  5th digit - 3 cm    Digit Composite Extension [facility]  Digit Opposition Opposition to index finger only Lacks to pinkie due to limited DIP pinkie flexion  (Blank rows = not tested)  UPPER EXTREMITY MMT:   Grossly WFL - Endurance limited R tricep strength > than L but L UE generally stronger than R UE  MMT Right (eval) Left (eval)  Shoulder flexion 4/5 4/5  Shoulder abduction 4/5 4/5  Elbow flexion 4/5 4/5  Elbow extension 4/5 4-/5  (Blank rows = not tested)  HAND FUNCTION: Grip strength: Right: 4.8 lbs; Left: 20.9 lbs 11/17/22 Right - 7.2 lbs  COORDINATION: Finger Nose Finger test: R generally WFL, L WNL Box and Blocks:  Right 28 blocks, Left 37 blocks R hand finger eventually cramps and dexterity worsens in the cold  SENSATION: Light touch: Impaired  - patient   EDEMA: NA for UEs but LE has poor lymph drainage with custom compression garments   MUSCLE TONE: Generally WFL but will assess further  COGNITION: Overall cognitive status: Within functional limits for tasks assessed  VISION: Subjective report: Patent wears progressive lens/glasses.  Denies diplopia or vision changes but has eye exam in the next couple of months. Baseline vision: Wears glasses all the time  VISION ASSESSMENT: WFL  OBSERVATIONS: Patient independent with power WC navigation within clinic.  Patient is well-kept with foley catheter in place.  She has slight limitations in full extension of digits but PROM is WNL and she has splints at home that she said she will bring for OT staff to assess.  She has functional ROM of B UEs to reach her head, behind her back and to cross midline to assist with ADLs.     TODAY'S TREATMENT:                                                                                                                                  Therapeutic activities -   Therapist reviewed OT POC and goals  for progress note today Checked goals and updated them with patient to help with progression of UE FM  skills.  No additional functional limitations identified. Objective measures assessed as noted in Goals section to determine progression towards goals. Right grip strength improved from 4.8 lbs at evaluation to 7.2 lbs.  In addition, reviewed HEP ideas and categories for organization with need to update daily HEP list for FM tasks, ROM etc. Daily WC pushups are being conducted and she has a gripper to work on strength with encouragement to work on daily writing - note taking at work or even Brewing technologist as well as functional FM tasks such as knitting.  Patient brought her knitting today and demonstrated how she knits with both hands moving the yarn with her R hand but having difficulty with stabilizing the needle on that side.  When working in her own, she found herself dropping stitches, repeating yarn loops and inconsistent wrapping of yarn.  When OT held the base of the knitting needle and stabilized it on her R thigh she had significant improved speed, comfort and ease of moving yarn around needle more accurately and consisently.  Explored [employer] for AE for use at home in order for patient to continue to work on daily "functional activities" as this task worked well on her pinch with both hands but will need further research and/or fabrication of necessary device.   PATIENT EDUCATION: Education details: Reviewed OT POC for progress note person educated: Patient and Caregiver Live in caregiver - [name] Education method: Explanation, Demonstration, Tactile cues, and Verbal cues,  Education comprehension: verbalized understanding, returned demonstration, and needs further education Patient desires specific list of exercises by groupings.  HOME EXERCISE PROGRAM: [email or web address] 10/27/22 - Theraputty  Access Code: [email or web address]  11/11/22 - Elbow Extension Access Code: [email or web address]   GOALS:   SHORT TERM GOALS: Target date: 11/09/22  Patient will be able to use AE/modified  techniques to cut soft foods small enough for oral intake. Baseline: Caregiver/spouse assist Goal status: IN PROGRESS  2.  Patient will be able to use AE/modified positioning to complete word search by drawing lines through words with 0 errors. Baseline: TBD Goal status: NOT MET - Goal discontinued as patient focusing on writing more than word search ie) note taking etc.  3.  Patient will verbalize understanding of good pressure relief schedule (for 15 to 60 seconds every 15 to 60 minutes) to help with wound healing. Baseline: Patient did not change positioning in > 45 minutes of OT eval. Goal status: MET  4.  Patient will be assisted to explore modifications for leisure tasks (knit/sew/bake) with good return demonstration. Baseline: Minimal involvement Goal status: IN PROGRESS  11/17/22 - trialled knitting   5.  Patient will demonstrate independence with HEP for UE strengthening, coordination and ROM to prevent contractures and maintain strength for transfers and ADLs. Baseline: Previous HEPs have been established but need to be reviewed and updated.  Goal status: IN PROGRESS  11/17/22 working on lists and categorizing activities  6.  Patient will be assessed for typing speed/dexterity. Baseline: Patient reports difficulty with typing with all her fingers. Goal status: IN PROGRESS  7. Patient will complete Quick Dash UE assessment. Baseline: TBD Goal Status: 10/20/22 Discontinued - see scores above   LONG TERM GOALS: Target date: 12/10/22  Patient will complete 1 small craft/week r/t her leisure interests to work on FMS daily. Baseline: Minimal involvement Goal status: IN PROGRESS  2.  Patient will improve B coordination for  increased typing speed/dexterity x 1-2 WPM. Baseline: TBD Goal status: IN PROGRESS  11/17/22 daily engagement with typing at home although modified use of digits for speed  3.  Patient will improve B UE coordination, strength and functional use to bake cookies  with setup assistance and AE/modified techniques as appropriate.  Baseline: Not performed. Goal status: IN PROGRESS  4.  Patient will report no more than moderate difficulty using a knife to foods such as chicken, small enough for oral intake using AE and strategies as needed. Baseline: Caregiver/spouse assist Goal status: IN PROGRESS  5.  Patient will be able to use AE/modified positioning to complete 4 sentences with 100% legibility. Baseline: Subjective reports of difficulty with writing Goal status: IN PROGRESS   ASSESSMENT:  CLINICAL IMPRESSION:  This 10th progress note is for dates: 10/12/22 to 11/17/2022. Pt has met 1/6 STGs with progress in all areas and is progressing in 5/5 LTGs.as well. Pt making progress towards goals as expected and continues to benefit from skilled OT services in the outpatient setting to work towards remaining goals or until max rehab potential is with UE use to help pt return to highest level of function as possible.      Patient demonstrated improved ease with knitting activity with R knitting needle stabilized on her R thigh.  Will explore AE for use at home in order for patient to continue to work on daily "functional activities" as this task worked well on her pinch with both hands.   PERFORMANCE DEFICITS: in functional skills including ADLs, IADLs, coordination, dexterity, strength, muscle spasms, Fine motor control, Gross motor control, continence, skin integrity, and UE functional use,   IMPAIRMENTS: are limiting patient from ADLs, IADLs, work, and leisure.   CO-MORBIDITIES: has co-morbidities such as incontinence and wound  that affects occupational performance. Patient will benefit from skilled OT to address above impairments and improve overall function.  REHAB POTENTIAL: Fair due to chronicity of injury   PLAN:  OT FREQUENCY: 2x/week  OT DURATION: 8 weeks  PLANNED INTERVENTIONS: self care/ADL training, therapeutic exercise, therapeutic  activity, neuromuscular re-education, manual therapy, passive range of motion, balance training, functional mobility training, splinting, patient/family education, energy conservation, coping strategies training, and DME and/or AE instructions  RECOMMENDED OTHER SERVICES: Patient was seen for PT evaluation today with treatment plans coordinated for 2x/week.  CONSULTED AND AGREED WITH PLAN OF CARE: Patient and family member/caregiver  PLAN FOR NEXT SESSION:   Continue to update BUE HEP (by category) including administration of HEP calendar. May use (.pdexchart) for template.    Continue FM tasks to assess and improve typing shoes and progressing simple hobby/craft activities (ie baking, yarn activities & baking).   E stim -- Patient asked for some additional settings and pad placements for her e-stim unit also to help with grip and pinch as she only used in in the "T-setting ie) MCP flexion with pad on forearm and in palm.  Patient is also asking for manual stretching of fingers to help with flexibility that her caregiver is not able to perform at this time.  Victorino Sparrow, OT 11/17/2022, 12:33 PM

## 2022-11-18 ENCOUNTER — Encounter: Payer: Self-pay | Admitting: Physical Therapy

## 2022-11-18 ENCOUNTER — Ambulatory Visit: Payer: Medicare PPO | Admitting: Physical Therapy

## 2022-11-18 ENCOUNTER — Ambulatory Visit: Payer: Medicare PPO | Admitting: Occupational Therapy

## 2022-11-18 DIAGNOSIS — R2689 Other abnormalities of gait and mobility: Secondary | ICD-10-CM | POA: Diagnosis not present

## 2022-11-18 DIAGNOSIS — R293 Abnormal posture: Secondary | ICD-10-CM | POA: Diagnosis not present

## 2022-11-18 DIAGNOSIS — M6281 Muscle weakness (generalized): Secondary | ICD-10-CM

## 2022-11-18 DIAGNOSIS — R278 Other lack of coordination: Secondary | ICD-10-CM

## 2022-11-18 DIAGNOSIS — M24541 Contracture, right hand: Secondary | ICD-10-CM | POA: Diagnosis not present

## 2022-11-18 DIAGNOSIS — G8254 Quadriplegia, C5-C7 incomplete: Secondary | ICD-10-CM

## 2022-11-18 DIAGNOSIS — G8253 Quadriplegia, C5-C7 complete: Secondary | ICD-10-CM | POA: Diagnosis not present

## 2022-11-18 DIAGNOSIS — R29818 Other symptoms and signs involving the nervous system: Secondary | ICD-10-CM | POA: Diagnosis not present

## 2022-11-18 DIAGNOSIS — M24542 Contracture, left hand: Secondary | ICD-10-CM | POA: Diagnosis not present

## 2022-11-18 NOTE — Therapy (Signed)
OUTPATIENT OCCUPATIONAL THERAPY NEURO TREATMENT  Patient Name: [name] MRN: [id number] DOB:18-Nov-1950, 72 y.o., female Today's Date: 11/18/2022  PCP: Carmen Headings, MD  REFERRING PROVIDER: [name], MD  END OF SESSION:  OT End of Session - 11/18/22 1009     Visit Number 11    Number of Visits 17    Date for OT Re-Evaluation 12/10/22    Authorization Type [employer] - requires auth, [place]    Progress Note Due on Visit 10    OT Start Time 1015    OT Stop Time 1059    OT Time Calculation (min) 44 min    Equipment Utilized During Treatment Patient in stander after PT x 15 minutes of session, then to power WC, therabar    Activity Tolerance Patient tolerated treatment well    Behavior During Therapy [facility] for tasks assessed/performed              Past Medical History:  Diagnosis Date   CERVICAL POLYP 03/11/2008   Qualifier: Diagnosis of  By: Carmen Sharp MD, [name]    Colon polyps 2005   on colonscopy Dr. Russella [name]   Fibroid 2004   Per Dr. Dareen [name]   History of shingles    face and mouth   Hx of skin cancer, basal cell    Rosacea    Sciatica of left side 09/28/2013   Scoliosis    noted on mri done for back pain   Past Surgical History:  Procedure Laterality Date   BUNIONECTOMY     Patient Active Problem List   Diagnosis Date Noted   Buttock wound, left, initial encounter 11/02/2022   Orthostatic hypotension 08/13/2022   Neurogenic bowel 05/03/2022   Spasticity 05/03/2022   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/03/2022   Nerve pain 05/03/2022   Medication monitoring encounter 01/08/2022   Neurogenic bladder 10/11/2021   Urinary incontinence 10/11/2021   ESBL (extended spectrum beta-lactamase) producing bacteria infection 10/09/2021   Recurrent UTI 10/09/2021   Quadriplegia, C5-C7 incomplete (HCC) 01/16/2021   History of spinal fracture 01/16/2021   Suprapubic catheter (HCC) 01/16/2021   Encounter for routine gynecological examination 09/28/2013   Onychomycosis  09/28/2013   Foot deformity, acquired 03/26/2012   Encounter for preventive health examination 12/25/2010   ROSACEA 08/25/2009   Disturbance in sleep behavior 03/11/2008   SKIN CANCER, HX OF 03/11/2008   DYSURIA, HX OF 03/11/2008   Hyperlipidemia 02/10/2007   CERVICALGIA 02/10/2007    ONSET DATE: 07/28/2020  Date of Referral 09/28/22  REFERRING DIAG: G82.54 (ICD-10-CM) - Quadriplegia, C5-C7, incomplete (HCC)  THERAPY DIAG:  Muscle weakness (generalized)  Other lack of coordination  Rationale for Evaluation and Treatment: Rehabilitation  SUBJECTIVE:   SUBJECTIVE STATEMENT:   Patient reports wound vac has been doing well - her caregiver will change the dressing tonight.  She has a dentist appt right after therapy today.  She got her new power WC yesterday but still needs the van attachment to be applied so she can get out in public in the chair.   Pt accompanied by:  Live in Caregiver - [name]   PERTINENT HISTORY: "Pt is a 72 yr old L handed female with hx of incomplete quadriplegia- 2/14 2022- fleeing the police in [place] on passenger 100 (high speed) miles/hour,  [name]; neurogenic bowel and bladder and spasticity; [name], [name] Here for f/u on Incomplete quadriplegia"  Referral from MD 09/28/22 states, "Please eval and treat for ADLs and higher level mobility."  PRECAUTIONS: Fall; suprapubic catheter (she wants to get this removed meaning she needs to get to and from the toilet); she has had minor heat sensation when needing to complete her bowel program-possible AD?   WEIGHT BEARING RESTRICTIONS: No-pt was in stander at most recent therapy in [place] last week.  She will have a bone density done soon w/ Dr. Fabian [name].   PAIN:  Patient reports chronic pain 3/10 fingers to elbow bilaterally managed by medication and will inform therapy staff of any changes.  FALLS: Has patient fallen in last 6 months? No  LIVING ENVIRONMENT: Lives with: lives with their  family - husband [name] and with an adult companion s/p moving back up from [place] x10 months Lives in: House/apartment Stairs:  4 story town house with an Engineer, structural with threshold adjustments, roll in shower with transport chair Has following equipment at home: Wheelchair (power) - with seat height adjustments to access counters and reclining option, Wheelchair (manual), transport WC, shower chair, and Ramped entry, handheld showerhead with rails around toilet, had Michiel Sites but is no longer in need of it, has slide boards x3  PLOF: Requires assistive device for independence, Needs assistance with ADLs, Needs assistance with homemaking, Needs assistance with gait, and Needs assistance with transfers; 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  Used to like to knit, sew and bake.  PATIENT GOALS: Wants to be able to type - currently using advanced Dragon dictation at times but prefers to type at times.  She would like to be able to write better and is interested in resuming some leisure activities such as Archivist and baking.  She also wants to keep working on being able to cut her own food and on her UE strength.   OBJECTIVE:   HAND DOMINANCE: Left  ADLs: Overall ADLs: Patient has a live in caregiver  Transfers/ambulation related to ADLs: Mod assist with sliding board transfers (previously [employer] lift).  Eating: Has a rocker knife that she can use. Used to use adapted utensils but now uses regular utensils but still will get assistance to cut food ie) when eating out.  Grooming: can brush her own hair but unable to manage jewelry ie) earrings  UB Dressing: can zip/unzip after it has been started, unable to manage buttons herself, Caregiver assists but if she has extra time, she can put on her bra, and a loose fitting pullover shirt/t-shirt  LB Dressing: dependent for LB dressing in bed and with special sock donner for LE compression garments   Toileting: bladder trained with  suprapubic catheter which she clamps off.  Dependent for bowel incontinence care.  Bathing: Sponge bath with adult washclothes.  Can bath UB with back scrubber for most of her back.  Needs help with feet (mentioned she might need a separate brush for feet)   Tub Shower transfers: Min-mod assist with slide board  Equipment: Shower seat with back, Walk in shower, bed side commode, Reacher, Sock aid, Long handled sponge, and Feeding equipment  IADLs: --  Shopping: Assisted by caregiver  Light housekeeping: Has housekeeper that comes monthly  Meal Prep: previously enjoyed baking. Assisted by caregiver but described recent success at reheating a meal for herself after getting food out of the fridge/freezer from her WC.  Community mobility: Dependent  Medication management: Caregiver sorts them into pillbox but she is very aware of her medications   Financial management: Patient manages her own finances  Handwriting: Increased time and has a pen with a little grip  MOBILITY STATUS: Independent with power mobiity  POSTURE COMMENTS:  No Significant postural limitations and forward head Sitting balance: Supports self independently with both Ues  ACTIVITY TOLERANCE: Activity tolerance: Fair - MMT WFL but has limited sustained tolerance for ongoing use of UEs  FUNCTIONAL OUTCOME MEASURES: 10/20/22 QuickDash 31.8 points   UPPER EXTREMITY ROM:   AROM - WFL without obvious contractures, some digital flexion noted but PROM WNL   AROM Right (eval) Left (eval)  Shoulder flexion [facility]  Shoulder abduction [facility]  Elbow flexion [facility] WFL  Elbow extension [facility]  Wrist flexion [facility] WFL  Wrist extension WFL WFL  Ulnar deviation WFL Decreased ulnar  deviation past midline  Wrist pronation [facility] WFL  Wrist supination [facility]  Digit Composite Flexion Lacks full AROM:   1st digit - 5 cm   3rd digit -1 cm   4th digit - 2 cm  Lacks full AROM:   1st digit - 1.5 cm  5th digit - 3 cm    Digit Composite Extension [facility]  Digit Opposition Opposition to index finger only Lacks to pinkie due to limited DIP pinkie flexion  (Blank rows = not tested)  UPPER EXTREMITY MMT:   Grossly WFL - Endurance limited R tricep strength > than L but L UE generally stronger than R UE  MMT Right (eval) Left (eval)  Shoulder flexion 4/5 4/5  Shoulder abduction 4/5 4/5  Elbow flexion 4/5 4/5  Elbow extension 4/5 4-/5  (Blank rows = not tested)  HAND FUNCTION: Grip strength: Right: 4.8 lbs; Left: 20.9 lbs 11/17/22 Right - 7.2 lbs  COORDINATION: Finger Nose Finger test: R generally WFL, L WNL Box and Blocks:  Right 28 blocks, Left 37 blocks R hand finger eventually cramps and dexterity worsens in the cold  SENSATION: Light touch: Impaired  - patient   EDEMA: NA for UEs but LE has poor lymph drainage with custom compression garments   MUSCLE TONE: Generally WFL but will assess further  COGNITION: Overall cognitive status: Within functional limits for tasks assessed  VISION: Subjective report: Patent wears progressive lens/glasses.  Denies diplopia or vision changes but has eye exam in the next couple of months. Baseline vision: Wears glasses all the time  VISION ASSESSMENT: WFL  OBSERVATIONS: Patient independent with power WC navigation within clinic.  Patient is well-kept with foley catheter in place.  She has slight limitations in full extension of digits but PROM is WNL and she has splints at home that she said she will bring for OT staff to assess.  She has functional ROM of B UEs to reach her head, behind her back and to cross midline to assist with ADLs.     TODAY'S TREATMENT:                                                                                                                                  VITALS: In standing  frame x 15 minutes BP 89/54 to begin and 82/55 at end but no complaints, dizziness or difficulties.   Self Care:  While standing in the standing  frame, patient was engaged in tying shoelaces a shoe placed on the tray in front of her. She was able to tie laces each time as long as they were at least 12" on each side. Demonstration provided re: difficulty with shorter laces with education to ensure the laces aren't too short for her as she has trouble with the FM aspect of pinching laces.   Patient worked on [employer] upon return to [facility] from standing frame to remove pad from her bottom, adjust seat pad and practice weight shifting/pressure relief.  Therapeutic Exercises:  Further categorization of daily HEP list for strength, ROM and fine motor/coordination tasks with  review of printed HEP ideas and identification of need for additional printing exercises ie) shoulder overhead/forward, use of weight bar and therabar as well as individual finger extension (at MCP joint) as this is the motion she lacks for during typing with all of her fingers.  Worked on therabar - yellow (which she has at home) and beige (which would be an increased challenge) with education to hold bar in controlled manner in and out when bending it with review of available MedBridge exercises to print for HEP binder.  Patient also interested in weighted bar    PATIENT EDUCATION: Education details: Reviewed OT POC for progress note person educated: Patient and Caregiver Live in caregiver - [name] Education method: Explanation, Demonstration, Tactile cues, and Verbal cues,  Education comprehension: verbalized understanding, returned demonstration, and needs further education Patient desires specific list of exercises by groupings.  HOME EXERCISE PROGRAM: [email or web address] 10/27/[email or web address] - [email or web address]  Access Code: [email or web address]  11/11/22 - Elbow Extension Access Code: [email or web address]  11/18/22 Wheelchair Push up Access Code: [email or web address]   GOALS:   SHORT TERM GOALS: Target date: 11/09/22  Patient will be able to use AE/modified techniques to cut soft foods small enough  for oral intake. Baseline: Caregiver/spouse assist Goal status: IN PROGRESS  2.  Patient will be able to use AE/modified positioning to complete word search by drawing lines through words with 0 errors. Baseline: TBD Goal status: NOT MET - Goal discontinued as patient focusing on writing more than word search ie) note taking etc.  3.  Patient will verbalize understanding of good pressure relief schedule (for 15 to 60 seconds every 15 to 60 minutes) to help with wound healing. Baseline: Patient did not change positioning in > 45 minutes of OT eval. Goal status: MET  4.  Patient will be assisted to explore modifications for leisure tasks (knit/sew/bake) with good return demonstration. Baseline: Minimal involvement Goal status: IN PROGRESS  11/17/22 - trialled knitting   5.  Patient will demonstrate independence with HEP for UE strengthening, coordination and ROM to prevent contractures and maintain strength for transfers and ADLs. Baseline: Previous HEPs have been established but need to be reviewed and updated.  Goal status: IN PROGRESS  11/17/22 working on lists and categorizing activities  6.  Patient will be assessed for typing speed/dexterity. Baseline: Patient reports difficulty with typing with all her fingers. Goal status: IN PROGRESS  7. Patient will complete Quick Dash UE assessment. Baseline: TBD Goal Status: 10/20/22 Discontinued - see scores above   LONG TERM GOALS: Target date: 12/10/22  Patient will complete 1 small craft/week r/t her leisure interests to work on FMS daily. Baseline: Minimal involvement Goal status: IN PROGRESS  2.  Patient will improve B coordination for increased typing speed/dexterity x 1-2 WPM. Baseline: TBD Goal status: IN PROGRESS  11/17/22 daily engagement with typing at home although modified use of digits for speed  3.  Patient will improve B UE coordination, strength and functional use to bake cookies with setup assistance and AE/modified  techniques as appropriate.  Baseline: Not performed. Goal status: IN PROGRESS  4.  Patient will report no more than moderate difficulty using a knife to foods such as chicken, small enough for oral intake using AE and strategies as needed. Baseline: Caregiver/spouse assist Goal status: IN PROGRESS  5.  Patient will be able to use AE/modified positioning to complete 4 sentences with 100% legibility. Baseline: Subjective reports of difficulty with writing Goal status: IN PROGRESS   ASSESSMENT:  CLINICAL IMPRESSION:   Patient demonstrated fairly good success with tying shoelaces today while standing in standing frame s/p PT.  It is appropriate to ensure length of laces is adequate for improved manipulation with coordination deficits. Patient presents with functional deficits and impairments as a result of SCI (incomplete quad) and continues to benefit from skilled OT services in the outpatient setting to work on strength, ROM, coordination and functional ADLs, leisure tasks and to maximize as carryover with HEP.        PERFORMANCE DEFICITS: in functional skills including ADLs, IADLs, coordination, dexterity, strength, muscle spasms, Fine motor control, Gross motor control, continence, skin integrity, and UE functional use,   IMPAIRMENTS: are limiting patient from ADLs, IADLs, work, and leisure.   CO-MORBIDITIES: has co-morbidities such as incontinence and wound  that affects occupational performance. Patient will benefit from skilled OT to address above impairments and improve overall function.  REHAB POTENTIAL: Fair due to chronicity of injury   PLAN:  OT FREQUENCY: 2x/week  OT DURATION: 8 weeks  PLANNED INTERVENTIONS: self care/ADL training, therapeutic exercise, therapeutic activity, neuromuscular re-education, manual therapy, passive range of motion, balance training, functional mobility training, splinting, patient/family education, energy conservation, coping strategies  training, and DME and/or AE instructions  RECOMMENDED OTHER SERVICES: Patient was seen for PT evaluation today with treatment plans coordinated for 2x/week.  CONSULTED AND AGREED WITH PLAN OF CARE: Patient and family member/caregiver  PLAN FOR NEXT SESSION:   Continue to update BUE HEP (by category) including administration of HEP calendar. May use (.pdexchart) for template.  Also -- combine all MedBridge exercises to 1 program.  Continue FM tasks to improve typing shoes and progressing simple hobby/craft activities (ie baking, yarn activities & baking).   E stim -- Patient asked for some additional settings and pad placements for her e-stim unit also to help with grip and pinch as she only used in in the "T-setting ie) MCP flexion with pad on forearm and in palm.  Patient is also asking for manual stretching of fingers to help with flexibility that her caregiver is not able to perform at this time.  Victorino Sparrow, OT 11/18/2022, 12:11 PM

## 2022-11-18 NOTE — Therapy (Signed)
OUTPATIENT OCCUPATIONAL THERAPY NEURO TREATMENT  Patient Name: [name] MRN: [id number] DOB:[birth year], 72 y.o., female Today's Date: 11/22/2022  PCP: [name], MD  REFERRING PROVIDER: [name], MD  END OF SESSION:  OT End of Session - 11/22/22 0933     Visit Number 12    OT Start Time 0933    OT Stop Time 1016    OT Time Calculation (min) 43 min    Equipment Utilized During Treatment Splint strapping material    Activity Tolerance Patient tolerated treatment well    Behavior During Therapy [facility] for tasks assessed/performed               Past Medical History:  Diagnosis Date   CERVICAL POLYP 03/11/2008   Qualifier: Diagnosis of  By: [name] MD, [name]    Colon polyps 2005   on colonscopy [name]   Fibroid 2004   Per Dr. [name]   History of shingles    face and mouth   Hx of skin cancer, basal cell    Rosacea    Sciatica of left side 09/28/2013   Scoliosis    noted on mri done for back pain   Past Surgical History:  Procedure Laterality Date   BUNIONECTOMY     Patient Active Problem List   Diagnosis Date Noted   Buttock wound, left, initial encounter 11/02/2022   Orthostatic hypotension 08/13/2022   Neurogenic bowel 05/03/2022   Spasticity 05/03/2022   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/03/2022   Nerve pain 05/03/2022   Medication monitoring encounter 01/08/2022   Neurogenic bladder 10/11/2021   Urinary incontinence 10/11/2021   ESBL (extended spectrum beta-lactamase) producing bacteria infection 10/09/2021   Recurrent UTI 10/09/2021   Quadriplegia, C5-C7 incomplete (HCC) 01/16/2021   History of spinal fracture 01/16/2021   Suprapubic catheter (HCC) 01/16/2021   Encounter for routine gynecological examination 09/28/2013   Onychomycosis 09/28/2013   Foot deformity, acquired 03/26/2012   Encounter for preventive health examination 12/25/2010   ROSACEA 08/25/2009   Disturbance in sleep behavior 03/11/2008   SKIN CANCER, HX OF 03/11/2008    DYSURIA, HX OF 03/11/2008   Hyperlipidemia 02/10/2007   CERVICALGIA 02/10/2007    ONSET DATE: 07/28/2020  Date of Referral 09/28/22  REFERRING DIAG: G82.54 (ICD-10-CM) - Quadriplegia, C5-C7, incomplete (HCC)  THERAPY DIAG:  Other lack of coordination  Muscle weakness (generalized)  Rationale for Evaluation and Treatment: Rehabilitation  SUBJECTIVE:   SUBJECTIVE STATEMENT:   Patient reports spending some time on her own this weekend while the caregiver had her day off with her mom, but she and her caregiver had setup food options etc which she was able to access on her own.   Caregiver changed the wound vac Thursday afternoon and Saturday morning and the wound seemed to have closed up a lot on Saturday. She had the vac off on Sunday & today due to difficulty with sealing the device but she returns to wound care clinic this afternoon.   Pt accompanied by:  Live in Caregiver - [name]   PERTINENT HISTORY: "Pt is a 72 yr old L handed female with hx of incomplete quadriplegia- 2/14 2022- fleeing the police in [place] on passenger 100 (high speed) miles/hour,  [name]; neurogenic bowel and bladder and spasticity; [name], [name] Here for f/u on Incomplete quadriplegia"  Referral from MD 09/28/22 states, "Please eval and treat for ADLs and higher level mobility."  PRECAUTIONS: Fall; suprapubic catheter (she wants to get this removed meaning  she needs to get to and from the toilet); she has had minor heat sensation when needing to complete her bowel program-possible AD?   WEIGHT BEARING RESTRICTIONS: No-pt was in stander at most recent therapy in [place] last week.  She will have a bone density done soon w/ Dr. [name].   PAIN:  Patient reports chronic pain 3/10 fingers to elbow bilaterally managed by medication and will inform therapy staff of any changes.  FALLS: Has patient fallen in last 6 months? No  LIVING ENVIRONMENT: Lives with: lives with their family -  husband [name] and with an adult companion s/p moving back up from [place] x10 months Lives in: House/apartment Stairs:  4 story town house with an Engineer, structural with threshold adjustments, roll in shower with transport chair Has following equipment at home: Wheelchair (power) - with seat height adjustments to access counters and reclining option, Wheelchair (manual), transport WC, shower chair, and Ramped entry, handheld showerhead with rails around toilet, had Michiel Sites but is no longer in need of it, has slide boards x3  PLOF: Requires assistive device for independence, Needs assistance with ADLs, Needs assistance with homemaking, Needs assistance with gait, and Needs assistance with transfers; 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  Used to like to knit, sew and bake.  PATIENT GOALS: Wants to be able to type - currently using advanced Dragon dictation at times but prefers to type at times.  She would like to be able to write better and is interested in resuming some leisure activities such as Archivist and baking.  She also wants to keep working on being able to cut her own food and on her UE strength.   OBJECTIVE:   HAND DOMINANCE: Left  ADLs: Overall ADLs: Patient has a live in caregiver  Transfers/ambulation related to ADLs: Mod assist with sliding board transfers (previously [employer] lift).  Eating: Has a rocker knife that she can use. Used to use adapted utensils but now uses regular utensils but still will get assistance to cut food ie) when eating out.  Grooming: can brush her own hair but unable to manage jewelry ie) earrings  UB Dressing: can zip/unzip after it has been started, unable to manage buttons herself, Caregiver assists but if she has extra time, she can put on her bra, and a loose fitting pullover shirt/t-shirt  LB Dressing: dependent for LB dressing in bed and with special sock donner for LE compression garments   Toileting: bladder trained with suprapubic  catheter which she clamps off.  Dependent for bowel incontinence care.  Bathing: Sponge bath with adult washclothes.  Can bath UB with back scrubber for most of her back.  Needs help with feet (mentioned she might need a separate brush for feet)   Tub Shower transfers: Min-mod assist with slide board  Equipment: Shower seat with back, Walk in shower, bed side commode, Reacher, Sock aid, Long handled sponge, and Feeding equipment  IADLs: --  Shopping: Assisted by caregiver  Light housekeeping: Has housekeeper that comes monthly  Meal Prep: previously enjoyed baking. Assisted by caregiver but described recent success at reheating a meal for herself after getting food out of the fridge/freezer from her WC.  Community mobility: Dependent  Medication management: Caregiver sorts them into pillbox but she is very aware of her medications   Financial management: Patient manages her own finances  Handwriting: Increased time and has a pen with a little grip  MOBILITY STATUS: Independent with power mobiity  POSTURE COMMENTS:  No Significant postural limitations and forward head Sitting balance: Supports self independently with both Ues  ACTIVITY TOLERANCE: Activity tolerance: Fair - MMT WFL but has limited sustained tolerance for ongoing use of UEs  FUNCTIONAL OUTCOME MEASURES: 10/20/22 QuickDash 31.8 points   UPPER EXTREMITY ROM:   AROM - WFL without obvious contractures, some digital flexion noted but PROM WNL   AROM Right (eval) Left (eval)  Shoulder flexion [facility]  Shoulder abduction [facility]  Elbow flexion [facility] WFL  Elbow extension [facility]  Wrist flexion [facility] WFL  Wrist extension WFL WFL  Ulnar deviation WFL Decreased ulnar  deviation past midline  Wrist pronation [facility] WFL  Wrist supination [facility]  Digit Composite Flexion Lacks full AROM:   1st digit - 5 cm   3rd digit -1 cm   4th digit - 2 cm  Lacks full AROM:   1st digit - 1.5 cm  5th digit - 3 cm   Digit  Composite Extension [facility]  Digit Opposition Opposition to index finger only Lacks to pinkie due to limited DIP pinkie flexion  (Blank rows = not tested)  UPPER EXTREMITY MMT:   Grossly WFL - Endurance limited R tricep strength > than L but L UE generally stronger than R UE  MMT Right (eval) Left (eval)  Shoulder flexion 4/5 4/5  Shoulder abduction 4/5 4/5  Elbow flexion 4/5 4/5  Elbow extension 4/5 4-/5  (Blank rows = not tested)  HAND FUNCTION: Grip strength: Right: 4.8 lbs; Left: 20.9 lbs 11/17/22 Right - 7.2 lbs  COORDINATION: Finger Nose Finger test: R generally WFL, L WNL Box and Blocks:  Right 28 blocks, Left 37 blocks R hand finger eventually cramps and dexterity worsens in the cold  SENSATION: Light touch: Impaired  - patient   EDEMA: NA for UEs but LE has poor lymph drainage with custom compression garments   MUSCLE TONE: Generally WFL but will assess further  COGNITION: Overall cognitive status: Within functional limits for tasks assessed  VISION: Subjective report: Patent wears progressive lens/glasses.  Denies diplopia or vision changes but has eye exam in the next couple of months. Baseline vision: Wears glasses all the time  VISION ASSESSMENT: WFL  EVALUATION OBSERVATIONS: Patient independent with power WC navigation within clinic.  Patient is well-kept with foley catheter in place.  She has slight limitations in full extension of digits but PROM is WNL and she has splints at home that she said she will bring for OT staff to assess.  She has functional ROM of B UEs to reach her head, behind her back and to cross midline to assist with ADLs.     TODAY'S TREATMENT:                                                                                                                                  Therapeutic Activities:  Patient was engaged in activities to improve bilateral upper  extremity coordination with increased focus on right hand and ability to achieve  finger opposition.    Patient is able to bring her right thumb and index finger together on her own but requires positioning of the thumb to facilitate opposition to long finger and ring finger.  She tends to get to the side of her long finger and needs extra time to get to the ring or even pinky finger through some hand over hand positioning by therapist.    Therapist is able to hold patient's hand in a curved position to allow her to complete finger opposition and used splint strapping to make a modified thumb positioner.  The soft strap is wrapped around her wrist and then the base of her thumb below the [facility] joint to pull the thumb towards midline.    Patient then worked on active motions to perform finger opposition.  Patient was encouraged to work on pinch to hold pieces of tissue.  She is able to sustain a pinch hard enough with index finger to rip a piece of tissue being pulled out of her fingers.  Patient is able to put some resistance between pinch of thumb and long finger and thumb and ring finger but not with the pinky finger.    Patient is encouraged to work on task bilaterally i.e. hold tissue with her left hand and try and rip it through sustained grip with right pinch.    Caregiver is shown how to apply soft favela foam wrap to help with position of thumb.  In addition patient had therapist take a picture of positioning option with her phone camera.  PATIENT EDUCATION: Education details: Finger opposition and positioning R thumb person educated: Patient and Caregiver Live in caregiver - [name] Education method: Explanation, Demonstration, Tactile cues, and Verbal cues,  Education comprehension: verbalized understanding, returned demonstration, and needs further education Patient desires specific list of exercises by groupings.  HOME EXERCISE PROGRAM: [email or web address] [email or web address]/[email or web address]  Access Code: [email or web address]  11/11/22 - Elbow Extension Access Code:  [email or web address]  11/18/22 Wheelchair Push up Access Code: [email or web address]   GOALS:   SHORT TERM GOALS: Target date: 11/09/22  Patient will be able to use AE/modified techniques to cut soft foods small enough for oral intake. Baseline: Caregiver/spouse assist Goal status: IN PROGRESS  2.  Patient will be able to use AE/modified positioning to complete word search by drawing lines through words with 0 errors. Baseline: TBD Goal status: NOT MET - Goal discontinued as patient focusing on writing more than word search ie) note taking etc.  3.  Patient will verbalize understanding of good pressure relief schedule (for 15 to 60 seconds every 15 to 60 minutes) to help with wound healing. Baseline: Patient did not change positioning in > 45 minutes of OT eval. Goal status: MET  4.  Patient will be assisted to explore modifications for leisure tasks (knit/sew/bake) with good return demonstration. Baseline: Minimal involvement Goal status: IN PROGRESS  11/17/22 - trialled knitting   5.  Patient will demonstrate independence with HEP for UE strengthening, coordination and ROM to prevent contractures and maintain strength for transfers and ADLs. Baseline: Previous HEPs have been established but need to be reviewed and updated.  Goal status: IN PROGRESS  11/17/22 working on lists and categorizing activities  6.  Patient will be assessed for typing speed/dexterity. Baseline: Patient reports difficulty with typing with all her fingers. Goal status: IN PROGRESS  7. Patient will complete Quick Dash UE assessment. Baseline: TBD Goal Status:  10/20/22 Discontinued - see scores above   LONG TERM GOALS: Target date: 12/10/22  Patient will complete 1 small craft/week r/t her leisure interests to work on FMS daily. Baseline: Minimal involvement Goal status: IN PROGRESS  2.  Patient will improve B coordination for increased typing speed/dexterity x 1-2 WPM. Baseline: TBD Goal status: IN PROGRESS  11/17/22 daily  engagement with typing at home although modified use of digits for speed  3.  Patient will improve B UE coordination, strength and functional use to bake cookies with setup assistance and AE/modified techniques as appropriate.  Baseline: Not performed. Goal status: IN PROGRESS  4.  Patient will report no more than moderate difficulty using a knife to foods such as chicken, small enough for oral intake using AE and strategies as needed. Baseline: Caregiver/spouse assist Goal status: IN PROGRESS  5.  Patient will be able to use AE/modified positioning to complete 4 sentences with 100% legibility. Baseline: Subjective reports of difficulty with writing Goal status: IN PROGRESS   ASSESSMENT:  CLINICAL IMPRESSION:   Patient demonstrated some increased finger opposition with hand overhand guidance and trial of positioning strap on her right thumb.  Patient continues to benefit from cues and guidance to improve fine motor tasks through positioning, modifications and activity suggestions to improve or compensate for functional deficits and impairments as a result of SCI (incomplete quad).  Skilled OT services in the outpatient setting is recommended to work on strength, ROM, coordination and functional ADLs, leisure tasks and to maximize as carryover with HEP.        PERFORMANCE DEFICITS: in functional skills including ADLs, IADLs, coordination, dexterity, strength, muscle spasms, Fine motor control, Gross motor control, continence, skin integrity, and UE functional use,   IMPAIRMENTS: are limiting patient from ADLs, IADLs, work, and leisure.   CO-MORBIDITIES: has co-morbidities such as incontinence and wound  that affects occupational performance. Patient will benefit from skilled OT to address above impairments and improve overall function.  REHAB POTENTIAL: Fair due to chronicity of injury   PLAN:  OT FREQUENCY: 2x/week  OT DURATION: 8 weeks  PLANNED INTERVENTIONS: self care/ADL  training, therapeutic exercise, therapeutic activity, neuromuscular re-education, manual therapy, passive range of motion, balance training, functional mobility training, splinting, patient/family education, energy conservation, coping strategies training, and DME and/or AE instructions  RECOMMENDED OTHER SERVICES: Patient was seen for PT evaluation today with treatment plans coordinated for 2x/week.  CONSULTED AND AGREED WITH PLAN OF CARE: Patient and family member/caregiver  PLAN FOR NEXT SESSION:   Review positioning of R hand to help with finger opposition and isolation of finger joints.  Continue to update BUE HEP (by category) including administration of HEP calendar. May use (.pdexchart) for template.  Also -- combine all MedBridge exercises to 1 program.  Continue FM tasks to improve typing shoes and progressing simple hobby/craft activities (ie baking, yarn activities & baking).   E stim -- Patient asked for some additional settings and pad placements for her e-stim unit also to help with grip and pinch as she only used in in the "T-setting ie) MCP flexion with pad on forearm and in palm.  Victorino Sparrow, OT 11/22/2022, 2:04 PM

## 2022-11-18 NOTE — Therapy (Signed)
OUTPATIENT PHYSICAL THERAPY NEURO TREATMENT   Patient Name: [name] MRN: [id number] DOB:Oct 16, 1950, 72 y.o., female Today's Date: 11/18/2022   PCP: [name], MD REFERRING PROVIDER: [name], MD   END OF SESSION:  PT End of Session - 11/18/22 0932     Visit Number 11    Number of Visits 17   16 + eval   Date for PT Re-Evaluation 12/10/22    Authorization Type [employer]ICARE    Progress Note Due on Visit 20    PT Start Time 0932    PT Stop Time 1018    PT Time Calculation (min) 46 min    Equipment Utilized During Treatment Gait belt    Activity Tolerance Patient tolerated treatment well    Behavior During Therapy WFL for tasks assessed/performed              Past Medical History:  Diagnosis Date   CERVICAL POLYP 03/11/2008   Qualifier: Diagnosis of  By: [name] MD, [name]    Colon polyps 2005   on colonscopy [name]   Fibroid 2004   Per Dr. [name]   History of shingles    face and mouth   Hx of skin cancer, basal cell    Rosacea    Sciatica of left side 09/28/2013   Scoliosis    noted on mri done for back pain   Past Surgical History:  Procedure Laterality Date   BUNIONECTOMY     Patient Active Problem List   Diagnosis Date Noted   Buttock wound, left, initial encounter 11/02/2022   Orthostatic hypotension 08/13/2022   Neurogenic bowel 05/03/2022   Spasticity 05/03/2022   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/03/2022   Nerve pain 05/03/2022   Medication monitoring encounter 01/08/2022   Neurogenic bladder 10/11/2021   Urinary incontinence 10/11/2021   ESBL (extended spectrum beta-lactamase) producing bacteria infection 10/09/2021   Recurrent UTI 10/09/2021   Quadriplegia, C5-C7 incomplete (HCC) 01/16/2021   History of spinal fracture 01/16/2021   Suprapubic catheter (HCC) 01/16/2021   Encounter for routine gynecological examination 09/28/2013   Onychomycosis 09/28/2013   Foot deformity, acquired 03/26/2012   Encounter for preventive  health examination 12/25/2010   ROSACEA 08/25/2009   Disturbance in sleep behavior 03/11/2008   SKIN CANCER, HX OF 03/11/2008   DYSURIA, HX OF 03/11/2008   Hyperlipidemia 02/10/2007   CERVICALGIA 02/10/2007    ONSET DATE: 09/28/2022 (most recent referral)  REFERRING DIAG: G82.54 (ICD-10-CM) - Quadriplegia, C5-C7, incomplete (HCC)  THERAPY DIAG:  Muscle weakness (generalized)  Abnormal posture  Other abnormalities of gait and mobility  Other lack of coordination  Quadriplegia, C5-C7 incomplete (HCC)  Rationale for Evaluation and Treatment: Rehabilitation  SUBJECTIVE:  SUBJECTIVE STATEMENT: Pt presents with a wound vac to wound on her L glute and hip area. Pt reports she received her new power wheelchair.  She has a busy day today with several appointments.  She is feeling well today.  Pt and caregiver realize they are missing her personal slide board that she needs to access dental chair today so [name] leaves after initial transfer to mat table to pick this up from home.  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 Yes: NPRS scale: 3/10 Pain location: forearms to fingertips Pain description: constant, pinprick/tingling Aggravating factors: nighttime Relieving factors: nothing, sometimes medicines  PRECAUTIONS: Fall; suprapubic catheter (she wants to get this removed meaning she needs to get to and from the toilet); she has had minor heat sensation when needing to complete her bowel program-possible AD?  WEIGHT BEARING RESTRICTIONS: No-pt was in stander at most recent therapy in [place] last week.  She will have a bone density done soon w/ Dr. [name].  FALLS: Has  patient fallen in last 6 months? No  LIVING ENVIRONMENT: Lives with: lives with their spouse and live-in nurse [name] Lives in: House/apartment-townhouse Stairs: No-level entry, but multi-level home 4 stories w/ elevator Has following equipment at home: Wheelchair (power), Wheelchair (manual), Grab bars, and standing frame, sliding board, transport shower chair, handheld shower head, leg strap-pt reports she no longer finds this helpful  PLOF: Requires assistive device for independence, Needs assistance with ADLs, Needs assistance with homemaking, and Needs assistance with transfers  OCCUPATION:  Writer-uses Dragon to dictate  PATIENT GOALS: "Make my right leg work."  Stand and pivot so she can more easily access a toilet.  OBJECTIVE:   DIAGNOSTIC FINDINGS: No recent relevant imaging.  Bone density scheduled 10/15/2022.  COGNITION: Overall cognitive status: Within functional limits for tasks assessed   SENSATION: Light touch: Diminished ability to distinguish sharp and dull, but able to distinguish light touch from injury level down accurately  COORDINATION: Not formally assessed.  EDEMA:  Well managed w/ lymphatic massage and compression stockings.  MUSCLE TONE: Pt has intermittent clonus during transfers.  POSTURE: rounded shoulders, posterior pelvic tilt, and right thoracic scoliosis   LOWER EXTREMITY ROM:     Passive  Right 10/20/22 Left 10/20/22  Hip flexion [facility]  Hip extension    Hip abduction    Hip adduction    Hip internal rotation [facility]  Hip external rotation [facility]  Knee flexion [facility] WFL  Knee extension [facility] WFL  Ankle dorsiflexion Slight PF contracture Slight PF contracture  Ankle plantarflexion    Ankle inversion    Ankle eversion     (Blank rows = not tested)    LOWER EXTREMITY MMT:    MMT Right 10/20/22 Left 10/20/22  Hip flexion 1 2+  Hip extension    Hip abduction    Hip adduction    Hip internal rotation    Hip external rotation    Knee  flexion 2- 3  Knee extension 2- 3  Ankle dorsiflexion 0 3  Ankle plantarflexion    Ankle inversion    Ankle eversion     (Blank rows = not tested)   BED MOBILITY:  Sit to supine Mod A Supine to sit Mod A Rolling to Right Mod A Rolling to Left Mod A Undulating mattress for wound management on standard bed (elevated-so often  doing uphill sliding board transfers); she would like to continue working on sitting up independently, she has been working on rolling, needs less assistance w/ this when someone props her leg into hooklying; would like something to help her pull her left leg to her butt for stretching as well as bed mobility.  FUNCTIONAL TESTS:  None relevant to pt's current functional level and abilities.  PATIENT SURVEYS:  None completed due to time.  TODAY'S TREATMENT:       TherAct Pt presents seated in her PWC. Pt performs lateral bump transfer left to right from PWC to mat table after PT levels the mat w/ chair cushion.  MinA for LE positioning for mechanical advantage of transfer.  PT positions stander and pt feet and catheter bag then assesses BP in sitting:  95/54, 81 bpm, pt asymptomatic.  Placed sling (under bottom only, no trunk support) and performed partial stand w/ sling sliding so returned to sitting for adjustment.  Pt partially standing x1 minute for acclimation to upright then full standing x2 minutes with re-assessment of BP:  79/51, 87bpm, remains asymptomatic and requesting to remain standing x2 more minutes for re-assessment:  83/50, 85 bpm.  Pt pelvis not as anterior and supportive as desired so returned to sitting EOM for tightening of sling and then return to standing using partial stand initially for acclimation before full upright.  Pt appears more straight and supported at posterior hip region (assessment to limit pressure on wound dressing).  After standing x2 minutes BP re-assessed:  85/50, 87 bpm, pt asymptomatic.  Remains standing for several rounds of large  cone moving using crossbody reach just slightly outside BOS.  Alternated task 2 rounds each of alternating UE bean bag toss, pt has increased difficulty w/ RUE.  Used target for tosses in center and slightly left of center to challenge core engagement.  Pt stands a total of 17 minutes and 3 seconds before handoff to OT with pt remaining upright in stander.  PATIENT EDUCATION: Education details:  BP assessment. Person educated: Patient and Arts administrator Education method: Explanation Education comprehension: verbalized understanding  HOME EXERCISE PROGRAM: Will be established as needed as pt has done continuous therapy and is working towards functional tasks.  GOALS: Goals reviewed with patient? Yes  SHORT TERM GOALS: Target date: 11/12/2022  HEP to be established for stretching and strengthening as needed. Baseline:  Pt has established home routine and adequate caregiver assistance at this time (5/30)  Goal status: IN PROGRESS  2.  Pt will be able to perform rolling L and R w/ no more than minA in order to improve functional mobility. Baseline: modA, min A to setup LE into hookyling (5/28) Goal status: MET  3.  Pt will perform sit<>supine requiring no more than minA in order to improve functional mobility. Baseline: modA, mod A (5/28) Goal status: IN PROGRESS  4.  Pt will perform bilateral reach outside seated BOS with feet supported without LOB in order to improve safety with ADL performance. Baseline: lateral LOB bilaterally demonstrated in w/c; pt able to reach 2-3 inches outside BOS w/ moderate wobble (R worse than L - 5/30) Goal status: MET  LONG TERM GOALS: Target date: 12/10/2022  Pt will perform squat pivot transfer w/ no more than modA in order to improve access to home environment for toilet transfers. Baseline: minA for lateral scoot transfer, pt able to clear bottom intermittently Goal status: INITIAL  2.  Pt will perform sit<>supine requiring no more than supervision  in order to improve transfers in and out of bed. Baseline:  modA Goal status: INITIAL  3.  Pt will report wound healing in order to return to day program at [facility]. Baseline:  Ischial wound Goal status: INITIAL  4.  Pt will be able to perform rolling L and R w/ no more than supervision in order to improve functional mobility. Baseline: modA Goal status: INITIAL  5.  Pt will perform uphill sliding board transfer w/ no more than modA in order to improve transfer in home environment. Baseline:  To be assessed. Goal status: INITIAL  6.  Pt will perform partial STS in stander or from elevated power w/c to counter w/ no more than modA in order to improve capacity for higher level transfers and access to uneven surfaces. Baseline: To be assessed. Goal status: INITIAL  ASSESSMENT:  CLINICAL IMPRESSION: Focus of skilled session on utilizing stander for weight bearing and trunk engagement with decreased UE reliance.  Pt does use some Y ligament support in unsupported standing, but otherwise demonstrates good upright tolerance today and improvement of trunk engagement to all UE tasks.  Her BP did remain on the lower side with the systolic ranging from 79-95, but the diastolic remaining in the 50s.  PT to continue working towards goals as patient remains motivated and continues to show benefit from interventions used in clinic.  OBJECTIVE IMPAIRMENTS: decreased balance, decreased coordination, decreased mobility, difficulty walking, decreased ROM, decreased strength, hypomobility, increased edema, impaired flexibility, impaired sensation, impaired tone, impaired UE functional use, improper body mechanics, postural dysfunction, and pain.   ACTIVITY LIMITATIONS: carrying, lifting, bending, sitting, standing, squatting, stairs, transfers, bed mobility, continence, bathing, toileting, dressing, reach over head, hygiene/grooming, locomotion level, and caring for others  PARTICIPATION  LIMITATIONS: meal prep, cleaning, laundry, driving, and community activity  PERSONAL FACTORS: Age, Fitness, Past/current experiences, Time since onset of injury/illness/exacerbation, and 1-2 comorbidities: intermittent AD, neurogenic bowel/bladder on active bladder training  are also affecting patient's functional outcome.   REHAB POTENTIAL: Good  CLINICAL DECISION MAKING: Evolving/moderate complexity  EVALUATION COMPLEXITY: Moderate  PLAN:  PT FREQUENCY: 2x/week  PT DURATION: 8 weeks  PLANNED INTERVENTIONS: Therapeutic exercises, Therapeutic activity, Neuromuscular re-education, Balance training, Patient/Family education, Self Care, DME instructions, Electrical stimulation, Wheelchair mobility training, Manual therapy, and Re-evaluation  PLAN FOR NEXT SESSION:  TENS or NMES to LE, leg puller for bed mobility, Further assess and address multilevel slide board transfers.  Education on pressure relief for wound healing.  Core strength, static sitting balance-perturbations.  Bed mobility.  Use stander to practice weight-bearing and core as tolerated--monitor BP due to history of OH, can work towards decreased sling support in standing, use of powderboard on mat table for RLE strengthening, wants to work on increasing independence with lateral scoot transfers without SB and also wants to be able to place SB independently and move her LE more independently; use of leg loops/lifters to increase independence with LE management with transfers and bed mobility  Sadie Haber, PT, DPT 11/18/2022, 11:00 AM

## 2022-11-21 ENCOUNTER — Encounter: Payer: Self-pay | Admitting: Podiatry

## 2022-11-21 NOTE — Progress Notes (Signed)
  Subjective:  Patient ID: [name], female    DOB: 08-02-1950,  MRN: [id number]  Chief Complaint  Patient presents with   Nail Problem    "Do my toenails.  Look at the red spots on my feet. Analyze my shoes and see if they may be contributing to my black spots on my feet and red spots on my toes.  I have toenail problems."    72 y.o. female presents with the above complaint. History confirmed with patient.  Has not had any recurrence of wounds but there are pressure spots that seem to be getting pressure as well as thickening and elongation of the nails with yellow discoloration they are unable to cut them effectively  Objective:  Physical Exam: Edema has improved quite a bit.  Still some cyanosis and pulses are palpable.  Yellowed elongated thickened nails subungual debris and dystrophy x 10.  No active ulceration or signs of infection Assessment:   1. Pain due to onychomycosis of toenails of both feet   2. Peripheral edema       Plan:  Patient was evaluated and treated and all questions answered.  Discussed the etiology and treatment options for the condition in detail with the patient. E Recommended debridement of the nails today. Sharp and mechanical debridement performed of all painful and mycotic nails today. Nails debrided in length and thickness using a nail nipper to level of comfort. Discussed treatment options including appropriate shoe gear. Follow up as needed for painful nails.  Culture of nail plate taken we discussed possible etiologies of this including fungal infection and we will discuss further treatment options pending results of this.  Discussed appropriate shoes and utilizing her compression stockings to reduce edema and prevent ulceration.   No follow-ups on file.

## 2022-11-22 ENCOUNTER — Ambulatory Visit: Payer: Medicare PPO | Admitting: Physical Therapy

## 2022-11-22 ENCOUNTER — Ambulatory Visit: Payer: Medicare PPO | Admitting: Occupational Therapy

## 2022-11-22 ENCOUNTER — Encounter (HOSPITAL_BASED_OUTPATIENT_CLINIC_OR_DEPARTMENT_OTHER): Payer: Medicare PPO | Admitting: Internal Medicine

## 2022-11-22 ENCOUNTER — Encounter: Payer: Self-pay | Admitting: Physical Therapy

## 2022-11-22 DIAGNOSIS — L89323 Pressure ulcer of left buttock, stage 3: Secondary | ICD-10-CM | POA: Diagnosis not present

## 2022-11-22 DIAGNOSIS — T798XXA Other early complications of trauma, initial encounter: Secondary | ICD-10-CM

## 2022-11-22 DIAGNOSIS — R278 Other lack of coordination: Secondary | ICD-10-CM

## 2022-11-22 DIAGNOSIS — G8250 Quadriplegia, unspecified: Secondary | ICD-10-CM

## 2022-11-22 DIAGNOSIS — R2689 Other abnormalities of gait and mobility: Secondary | ICD-10-CM | POA: Diagnosis not present

## 2022-11-22 DIAGNOSIS — R29818 Other symptoms and signs involving the nervous system: Secondary | ICD-10-CM | POA: Diagnosis not present

## 2022-11-22 DIAGNOSIS — M6281 Muscle weakness (generalized): Secondary | ICD-10-CM

## 2022-11-22 DIAGNOSIS — R293 Abnormal posture: Secondary | ICD-10-CM | POA: Diagnosis not present

## 2022-11-22 DIAGNOSIS — M24542 Contracture, left hand: Secondary | ICD-10-CM | POA: Diagnosis not present

## 2022-11-22 DIAGNOSIS — G8254 Quadriplegia, C5-C7 incomplete: Secondary | ICD-10-CM | POA: Diagnosis not present

## 2022-11-22 DIAGNOSIS — M24541 Contracture, right hand: Secondary | ICD-10-CM | POA: Diagnosis not present

## 2022-11-22 DIAGNOSIS — G8253 Quadriplegia, C5-C7 complete: Secondary | ICD-10-CM | POA: Diagnosis not present

## 2022-11-22 NOTE — Therapy (Unsigned)
OUTPATIENT PHYSICAL THERAPY NEURO TREATMENT   Patient Name: [name] MRN: [id number] DOB:September 18, 1950, 72 y.o., female Today's Date: 11/23/2022   PCP: [name], MD REFERRING PROVIDER: [name], MD   END OF SESSION:  PT End of Session - 11/22/22 1020     Visit Number 12    Number of Visits 17   16 + eval   Date for PT Re-Evaluation 12/10/22    Authorization Type [employer]ICARE    Progress Note Due on Visit 20    PT Start Time 1017    PT Stop Time 1110    PT Time Calculation (min) 53 min    Equipment Utilized During Treatment Gait belt    Activity Tolerance Patient tolerated treatment well    Behavior During Therapy WFL for tasks assessed/performed              Past Medical History:  Diagnosis Date   CERVICAL POLYP 03/11/2008   Qualifier: Diagnosis of  By: [name] MD, [name]    Colon polyps 2005   on colonscopy [name]   Fibroid 2004   Per Dr. [name]   History of shingles    face and mouth   Hx of skin cancer, basal cell    Rosacea    Sciatica of left side 09/28/2013   Scoliosis    noted on mri done for back pain   Past Surgical History:  Procedure Laterality Date   BUNIONECTOMY     Patient Active Problem List   Diagnosis Date Noted   Buttock wound, left, initial encounter 11/02/2022   Orthostatic hypotension 08/13/2022   Neurogenic bowel 05/03/2022   Spasticity 05/03/2022   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/03/2022   Nerve pain 05/03/2022   Medication monitoring encounter 01/08/2022   Neurogenic bladder 10/11/2021   Urinary incontinence 10/11/2021   ESBL (extended spectrum beta-lactamase) producing bacteria infection 10/09/2021   Recurrent UTI 10/09/2021   Quadriplegia, C5-C7 incomplete (HCC) 01/16/2021   History of spinal fracture 01/16/2021   Suprapubic catheter (HCC) 01/16/2021   Encounter for routine gynecological examination 09/28/2013   Onychomycosis 09/28/2013   Foot deformity, acquired 03/26/2012   Encounter for preventive  health examination 12/25/2010   ROSACEA 08/25/2009   Disturbance in sleep behavior 03/11/2008   SKIN CANCER, HX OF 03/11/2008   DYSURIA, HX OF 03/11/2008   Hyperlipidemia 02/10/2007   CERVICALGIA 02/10/2007    ONSET DATE: 09/28/2022 (most recent referral)  REFERRING DIAG: G82.54 (ICD-10-CM) - Quadriplegia, C5-C7, incomplete (HCC)  THERAPY DIAG:  Muscle weakness (generalized)  Other lack of coordination  Abnormal posture  Other abnormalities of gait and mobility  Quadriplegia, C5-C7 incomplete (HCC)  Rationale for Evaluation and Treatment: Rehabilitation  SUBJECTIVE:  SUBJECTIVE STATEMENT: Pt presents without wound vac to wound on her L glute and hip area due to technical difficulties.  She denies other acute changes.    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 Yes: NPRS scale: 3/10 Pain location: forearms to fingertips Pain description: constant, pinprick/tingling Aggravating factors: nighttime Relieving factors: nothing, sometimes medicines  PRECAUTIONS: Fall; suprapubic catheter (she wants to get this removed meaning she needs to get to and from the toilet); she has had minor heat sensation when needing to complete her bowel program-possible AD?  WEIGHT BEARING RESTRICTIONS: No-pt was in stander at most recent therapy in [place] last week.  She will have a bone density done soon w/ Dr. [name].  FALLS: Has patient fallen in last 6 months? No  LIVING ENVIRONMENT: Lives with: lives with their spouse and live-in nurse [name] Lives in: House/apartment-townhouse Stairs: No-level entry, but multi-level home 4 stories w/ elevator Has following equipment at  home: Wheelchair (power), Wheelchair (manual), Grab bars, and standing frame, sliding board, transport shower chair, handheld shower head, leg strap-pt reports she no longer finds this helpful  PLOF: Requires assistive device for independence, Needs assistance with ADLs, Needs assistance with homemaking, and Needs assistance with transfers  OCCUPATION:  Writer-uses Dragon to dictate  PATIENT GOALS: "Make my right leg work."  Stand and pivot so she can more easily access a toilet.  OBJECTIVE:   DIAGNOSTIC FINDINGS: No recent relevant imaging.  Bone density scheduled 10/15/2022.  COGNITION: Overall cognitive status: Within functional limits for tasks assessed   SENSATION: Light touch: Diminished ability to distinguish sharp and dull, but able to distinguish light touch from injury level down accurately  COORDINATION: Not formally assessed.  EDEMA:  Well managed w/ lymphatic massage and compression stockings.  MUSCLE TONE: Pt has intermittent clonus during transfers.  POSTURE: rounded shoulders, posterior pelvic tilt, and right thoracic scoliosis   LOWER EXTREMITY ROM:     Passive  Right 10/20/22 Left 10/20/22  Hip flexion [facility]  Hip extension    Hip abduction    Hip adduction    Hip internal rotation [facility] WFL  Hip external rotation [facility]  Knee flexion [facility] WFL  Knee extension [facility] WFL  Ankle dorsiflexion Slight PF contracture Slight PF contracture  Ankle plantarflexion    Ankle inversion    Ankle eversion     (Blank rows = not tested)    LOWER EXTREMITY MMT:    MMT Right 10/20/22 Left 10/20/22  Hip flexion 1 2+  Hip extension    Hip abduction    Hip adduction    Hip internal rotation    Hip external rotation    Knee flexion 2- 3  Knee extension 2- 3  Ankle dorsiflexion 0 3  Ankle plantarflexion    Ankle inversion    Ankle eversion     (Blank rows = not tested)   BED MOBILITY:  Sit to supine Mod A Supine to sit Mod A Rolling to Right Mod A Rolling to Left  Mod A Undulating mattress for wound management on standard bed (elevated-so often doing uphill sliding board transfers); she would like to continue working on sitting up independently, she has been working on rolling, needs less assistance w/ this when someone props her leg into hooklying; would like something to help her pull her left leg to her butt for stretching  as well as bed mobility.  FUNCTIONAL TESTS:  None relevant to pt's current functional level and abilities.  PATIENT SURVEYS:  None completed due to time.  TODAY'S TREATMENT:       TherAct Pt presents seated in her PWC. Pt performs lateral bump transfer minA for foot adjustment right to left from PWC to mat table after PT levels the mat w/ chair cushion.  PT helps pt transition to supine with modA for management of LE and pivoting in supine with management of catheter bag.  In supine PT places large rectangular E-Stim pads on left thigh with comparison of pt personal TENS/NMES unit to clinic available NMES unit for assessment of appropriate setup.  Pt BLE placed over blue bolster for mild knee flexion positioning.  Once setup established using personal unit and preset for thigh NMES selected unit adjusted to 44 for initial 5 minutes to assess pt tolerance and contraction elicited.  Education provided on spacing of electrodes and depth of wavelength, presets based on booklet (P1 vs P2 - used P1 today for 12 seconds on time and 30 seconds off time).  Discussed benefits of off time for motor recruitment at next on period.  Added pads to right thigh and began a second cycle (each cycle x5 minutes) on BLE in supine.  Palpation for contraction with pt tolerance allowing for increase of current to 47 on each channel.  Pt transitions to left side-lying with powder board placed between LE.  Shoes were removed for ease of active movement and frictionless surface.  RLE NMES conducted x5 minutes with pt performing AAROM for knee flexion and hip extension  during off period.  PT providing assist to get pt into bent position in order to allow for increased anterior chain ROM during on period.  RLE more limited than LLE.  Repeated in right side-lying on LLE with improved active motion during off period.  Once completed pt returns to supine independently while PT removes powderboard and adjusts setup for safety.  Pads removed with mild redness noted under each pad possibly due to adhesive, education for monitoring to patient and caregiver.  Discussed electrical burn appearance vs normal redness from adhesive vs other potential risk for skin breakdown.  Educated that redness should disappear within 20 minutes and if it does not to continue monitoring for rash or wound with communication to PCP.  PT and caregiver swing pt into short sitting EOM due to wheelchair proximity to mat and pt.  Once sitting, pt is able to transfer using slide board to right minA for LE placement.  Pt elected to transfer without shoes due to ease of transfer with slideboard.  Shoes replaced once in wheelchair.  Caregiver assists pt in adjustment in wheelchair as needed prior to leaving clinic.  PATIENT EDUCATION: Education details:  NMES vs TENS.  Safety and skin check. Person educated: Patient and Arts administrator Education method: Explanation Education comprehension: verbalized understanding  HOME EXERCISE PROGRAM: Will be established as needed as pt has done continuous therapy and is working towards functional tasks.  GOALS: Goals reviewed with patient? Yes  SHORT TERM GOALS: Target date: 11/12/2022  HEP to be established for stretching and strengthening as needed. Baseline:  Pt has established home routine and adequate caregiver assistance at this time (5/30)  Goal status: IN PROGRESS  2.  Pt will be able to perform rolling L and R w/ no more than minA in order to improve functional mobility. Baseline: modA, min A to setup LE into  hookyling (5/28) Goal status: MET  3.  Pt  will perform sit<>supine requiring no more than minA in order to improve functional mobility. Baseline: modA, mod A (5/28) Goal status: IN PROGRESS  4.  Pt will perform bilateral reach outside seated BOS with feet supported without LOB in order to improve safety with ADL performance. Baseline: lateral LOB bilaterally demonstrated in w/c; pt able to reach 2-3 inches outside BOS w/ moderate wobble (R worse than L - 5/30) Goal status: MET  LONG TERM GOALS: Target date: 12/10/2022  Pt will perform squat pivot transfer w/ no more than modA in order to improve access to home environment for toilet transfers. Baseline: minA for lateral scoot transfer, pt able to clear bottom intermittently Goal status: INITIAL  2.  Pt will perform sit<>supine requiring no more than supervision in order to improve transfers in and out of bed. Baseline:  modA Goal status: INITIAL  3.  Pt will report wound healing in order to return to day program at [facility]. Baseline:  Ischial wound Goal status: INITIAL  4.  Pt will be able to perform rolling L and R w/ no more than supervision in order to improve functional mobility. Baseline: modA Goal status: INITIAL  5.  Pt will perform uphill sliding board transfer w/ no more than modA in order to improve transfer in home environment. Baseline:  To be assessed. Goal status: INITIAL  6.  Pt will perform partial STS in stander or from elevated power w/c to counter w/ no more than modA in order to improve capacity for higher level transfers and access to uneven surfaces. Baseline: To be assessed. Goal status: INITIAL  ASSESSMENT:  CLINICAL IMPRESSION: Session today dedicated to addressing NMES setup for LE at home with use for session to maximize ROM and strength benefits.  Used powderboard in gravity eliminated position to allow pt opportunity to engage quads, hip flexors, hamstrings, and glutes to active assisted ROM as able.  Her RLE is significantly weaker  than her left, but she would likely benefit from further NMES use in the future.  Using NMES in the stander or at least addressing setup is also something that pt and caregiver are interested in.  Patient continues to benefit from skilled PT to address functional mobility.  OBJECTIVE IMPAIRMENTS: decreased balance, decreased coordination, decreased mobility, difficulty walking, decreased ROM, decreased strength, hypomobility, increased edema, impaired flexibility, impaired sensation, impaired tone, impaired UE functional use, improper body mechanics, postural dysfunction, and pain.   ACTIVITY LIMITATIONS: carrying, lifting, bending, sitting, standing, squatting, stairs, transfers, bed mobility, continence, bathing, toileting, dressing, reach over head, hygiene/grooming, locomotion level, and caring for others  PARTICIPATION LIMITATIONS: meal prep, cleaning, laundry, driving, and community activity  PERSONAL FACTORS: Age, Fitness, Past/current experiences, Time since onset of injury/illness/exacerbation, and 1-2 comorbidities: intermittent AD, neurogenic bowel/bladder on active bladder training  are also affecting patient's functional outcome.   REHAB POTENTIAL: Good  CLINICAL DECISION MAKING: Evolving/moderate complexity  EVALUATION COMPLEXITY: Moderate  PLAN:  PT FREQUENCY: 2x/week  PT DURATION: 8 weeks  PLANNED INTERVENTIONS: Therapeutic exercises, Therapeutic activity, Neuromuscular re-education, Balance training, Patient/Family education, Self Care, DME instructions, Electrical stimulation, Wheelchair mobility training, Manual therapy, and Re-evaluation  PLAN FOR NEXT SESSION:  TENS or NMES to LE, leg puller for bed mobility, Further assess and address multilevel slide board transfers.  Education on pressure relief for wound healing.  Core strength, static sitting balance-perturbations.  Bed mobility.  Use stander to practice weight-bearing and core as tolerated--monitor BP due  to history  of OH, can work towards decreased sling support in standing, use of powderboard on mat table for RLE strengthening, wants to work on increasing independence with lateral scoot transfers without SB and also wants to be able to place SB independently and move her LE more independently; use of leg loops/lifters to increase independence with LE management with transfers and bed mobility  Sadie Haber, PT, DPT 11/23/2022, 6:58 AM

## 2022-11-23 NOTE — Progress Notes (Signed)
Carmen, [name] [name] [id number] Page 1 of 8 Visit Report for 11/01/2022 Arrival Information Details Patient Name: Date of Service: Carmen NDO [place], [place] [name]. 11/01/2022 9:30 A M Medical Record Number: [id number] Patient Account Number: [id number] Date of Birth/Sex: Treating RN: 03/15/1951 (72 y.o. [name] Primary Care [name]: [name] Other Clinician: Referring [name]: Treating [name]/Extender: [name] in Treatment: 3 Visit Information History Since Last Visit All ordered tests and consults were completed: Yes Patient Arrived: Wheel Chair Added or deleted any medications: No Arrival Time: 11:34 Any new allergies or adverse reactions: No Accompanied By: caregiver Had a fall or experienced change in No Transfer Assistance: Transfer Board activities of daily living that may affect Patient Identification Verified: Yes risk of falls: Secondary Verification Process Completed: Yes Signs or symptoms of abuse/neglect since last visito No Patient Requires Transmission-Based Precautions: No Hospitalized since last visit: No Patient Has Alerts: No Implantable device outside of the clinic excluding No cellular tissue based products placed in the center since last visit: Has Dressing in Place as Prescribed: Yes Pain Present Now: No Electronic Signature(s) Signed: 11/23/2022 7:50:28 AM By: [name] Entered By: [name] on 11/01/2022 11:35:31 -------------------------------------------------------------------------------- Clinic Level of Care Assessment Details Patient Name: Date of Service: [name], [place] [name]. 11/01/2022 9:30 A M Medical Record Number: [id number] Patient Account Number: [id number] Date of Birth/Sex: Treating RN: Oct 22, 1950 (72 y.o. [name] Primary Care [name]: [name] Other Clinician: Referring Lovelace [name]: Treating [name]/Extender: [name] in Treatment: 3 Clinic Level of Care Assessment Items TOOL 4 Quantity Score X- 1 0 Use when only an EandM is performed on FOLLOW-UP visit ASSESSMENTS - Nursing Assessment / Reassessment X- 1 10 Reassessment of Co-morbidities (includes updates in patient status) X- 1 5 Reassessment of Adherence to Treatment Plan ASSESSMENTS - Wound and Skin A ssessment / Reassessment X - Simple Wound Assessment / Reassessment - one wound 1 5 []  - 0 Complex Wound Assessment / Reassessment - multiple wounds []  - 0 Dermatologic / Skin Assessment (not related to wound area) ASSESSMENTS - Focused Assessment X- 1 5 Circumferential Edema Measurements - multi extremities []  - 0 Nutritional Assessment / Counseling / Intervention []  - 0 Lower Extremity Assessment (monofilament, tuning fork, pulses) []  - 0 Peripheral Arterial Disease Assessment (using hand held doppler) ASSESSMENTS - Ostomy and/or Continence Assessment and Care []  - 0 Incontinence Assessment and Management []  - 0 Ostomy Care Assessment and Management (repouching, etc.) PROCESS - Coordination of Care Carmen, [name] ([id number]) [id number] Page 2 of 8 X- 1 15 Simple Patient / Family Education for ongoing care []  - 0 Complex (extensive) Patient / Family Education for ongoing care X- 1 10 Staff obtains Chiropractor, Records, T Results / Process Orders est []  - 0 Staff telephones HHA, Nursing Homes / Clarify orders / etc []  - 0 Routine Transfer to another Facility (non-emergent condition) []  - 0 Routine Hospital Admission (non-emergent condition) []  - 0 New Admissions / Manufacturing engineer / Ordering NPWT Apligraf, etc. , []  - 0 Emergency Hospital Admission (emergent condition) X- 1 10 Simple Discharge Coordination []  - 0 Complex (extensive) Discharge Coordination PROCESS - Special Needs []  - 0 Pediatric / Minor Patient Management []  - 0 Isolation Patient Management []  - 0 Hearing  / Language / Visual special needs []  - 0 Assessment of Community assistance (transportation, D/C planning, etc.) []  - 0 Additional assistance / Altered mentation []  - 0 Support Surface(s) Assessment (bed, cushion,  seat, etc.) INTERVENTIONS - Wound Cleansing / Measurement X - Simple Wound Cleansing - one wound 1 5 []  - 0 Complex Wound Cleansing - multiple wounds X- 1 5 Wound Imaging (photographs - any number of wounds) []  - 0 Wound Tracing (instead of photographs) X- 1 5 Simple Wound Measurement - one wound []  - 0 Complex Wound Measurement - multiple wounds INTERVENTIONS - Wound Dressings X - Small Wound Dressing one or multiple wounds 1 10 []  - 0 Medium Wound Dressing one or multiple wounds []  - 0 Large Wound Dressing one or multiple wounds X- 1 5 Application of Medications - topical []  - 0 Application of Medications - injection INTERVENTIONS - Miscellaneous []  - 0 External ear exam X- 1 5 Specimen Collection (cultures, biopsies, blood, body fluids, etc.) X- 1 5 Specimen(s) / Culture(s) sent or taken to Lab for analysis []  - 0 Patient Transfer (multiple staff / Nurse, adult / Similar devices) []  - 0 Simple Staple / Suture removal (25 or less) []  - 0 Complex Staple / Suture removal (26 or more) []  - 0 Hypo / Hyperglycemic Management (close monitor of Blood Glucose) []  - 0 Ankle / Brachial Index (ABI) - do not check if billed separately X- 1 5 Vital Signs Has the patient been seen at the hospital within the last three years: Yes Total Score: 105 Level Of Care: New/Established - Level 3 Electronic Signature(s) Signed: 11/23/2022 7:50:28 AM By: [name], [name] [id number] Page 3 of 8 Entered By: [name] on 11/01/2022 10:26:19 -------------------------------------------------------------------------------- Encounter Discharge Information Details Patient Name: Date of Service: Carmen NDO [place], [place] [name].  11/01/2022 9:30 A M Medical Record Number: [id number] Patient Account Number: [id number] Date of Birth/Sex: Treating RN: 1950-08-07 (72 y.o. [name] Primary Care [name]: [name] Other Clinician: Referring Syna Gad: Treating Nyashia Raney/Extender: [name] in Treatment: 3 Encounter Discharge Information Items Discharge Condition: Stable Ambulatory Status: Wheelchair Discharge Destination: Home Transportation: Private Auto Accompanied By: self Schedule Follow-up Appointment: Yes Clinical Summary of Care: Patient Declined Electronic Signature(s) Signed: 11/23/2022 7:50:28 AM By: [name] Entered By: [name] on 11/01/2022 10:28:43 -------------------------------------------------------------------------------- Lower Extremity Assessment Details Patient Name: Date of Service: Carmen NDO N, [place] [name]. 11/01/2022 9:30 A M Medical Record Number: [id number] Patient Account Number: [id number] Date of Birth/Sex: Treating RN: 1951/05/26 (72 y.o. [name] Primary Care Vinisha Faxon: [name] Other Clinician: Referring Ardine Iacovelli: Treating Staceyann Knouff/Extender: [name] in Treatment: 3 Electronic Signature(s) Signed: 11/23/2022 7:50:28 AM By: [name] Entered By: [name] on 11/01/2022 09:50:57 -------------------------------------------------------------------------------- Multi Wound Chart Details Patient Name: Date of Service: Carmen NDO N, [place] [name]. 11/01/2022 9:30 A M Medical Record Number: [id number] Patient Account Number: [id number] Date of Birth/Sex: Treating RN: October 12, 1950 (72 y.o. [name]) Primary Care Jeweline Reif: [name] Other Clinician: Referring Savan Ruta: Treating Niko Penson/Extender: [name] in Treatment: 3 Vital Signs Height(in): 64 Pulse(bpm): 79 Weight(lbs): 115 Blood Pressure(mmHg): 98/62 Body Mass Index(BMI): 19.7 Temperature([name]): 98.2 Respiratory  Rate(breaths/min): 18 [1:Photos:] [[name]/A:[name]/A] Left Gluteus [name]/A [name]/A Wound Location: Pressure Injury [name]/A [name]/A Wounding Event: Pressure Ulcer [name]/A [name]/A Primary Etiology: Cataracts, Lymphedema, [name]/A [name]/A Comorbid History: Hypotension, Quadriplegia 07/15/2022 [name]/A [name]/A Date Acquired: 3 [name]/A [name]/A Weeks of Treatment: Open [name]/A [name]/A Wound Status: No [name]/A [name]/A Wound Recurrence: 0.5x0.9x2.6 [name]/A [name]/A Measurements L x W x D (cm) 0.353 [name]/A [name]/A A (cm) : rea 0.919 [name]/A [name]/A Volume (cm) : 6.40% [name]/A [name]/A % Reduction in A rea: -87.60%  [name]/A [name]/A % Reduction in Volume: 3 Position 1 (o'clock): 2.6 Maximum Distance 1 (cm): Yes [name]/A [name]/A Tunneling: Category/Stage III [name]/A [name]/A Classification: Medium [name]/A [name]/A Exudate A mount: Serosanguineous [name]/A [name]/A Exudate Type: red, brown [name]/A [name]/A Exudate Color: Distinct, outline attached [name]/A [name]/A Wound Margin: Medium (34-66%) [name]/A [name]/A Granulation A mount: Red, Pink [name]/A [name]/A Granulation Quality: Medium (34-66%) [name]/A [name]/A Necrotic A mount: Fat Layer (Subcutaneous Tissue): Yes [name]/A [name]/A Exposed Structures: Fascia: No Tendon: No Muscle: No Joint: No Bone: No Small (1-33%) [name]/A [name]/A Epithelialization: Excoriation: No [name]/A [name]/A Periwound Skin Texture: Induration: No Callus: No Crepitus: No Rash: No Scarring: No Maceration: No [name]/A [name]/A Periwound Skin Moisture: Dry/Scaly: No Atrophie Blanche: No [name]/A [name]/A Periwound Skin Color: Cyanosis: No Ecchymosis: No Erythema: No Hemosiderin Staining: No Mottled: No Pallor: No Rubor: No Treatment Notes Electronic Signature(s) Signed: 11/01/2022 2:02:21 PM By: Geralyn Corwin DO Entered By: Geralyn Corwin on 11/01/2022 10:11:01 -------------------------------------------------------------------------------- Multi-Disciplinary Care Plan Details Patient Name: Date of Service: Carmen NDO N, MA [name]. 11/01/2022 9:30 A M Medical Record Number: [id number] Patient Account Number: [id number] Date of Birth/Sex: Treating RN: 01-28-1951 (72  y.o. [name] Primary Care Elieser Tetrick: [name] Other Clinician: Referring Lorijean Husser: Treating Alan Riles/Extender: [name] in Treatment: 3 Active Inactive Nutrition Nursing Diagnoses: Potential for alteratiion in Nutrition/Potential for imbalanced nutrition Goals: Patient/caregiver agrees to and verbalizes understanding of need to obtain nutritional consultation Date Initiated: 10/11/2022 Target Resolution Date: 10/21/2022 Goal Status: Active ASHLEIGH, SCHOENECK ([id number]) [phone].pdf Page 5 of 8 Interventions: Provide education on elevated blood sugars and impact on wound healing Provide education on nutrition Treatment Activities: Patient referred to Primary Care Physician for further nutritional evaluation : 10/11/2022 Notes: Orientation to the Wound Care Program Nursing Diagnoses: Knowledge deficit related to the wound healing center program Goals: Patient/caregiver will verbalize understanding of the Wound Healing Center Program Date Initiated: 10/11/2022 Target Resolution Date: 10/22/2022 Goal Status: Active Interventions: Provide education on orientation to the wound center Notes: Pressure Nursing Diagnoses: Potential for impaired tissue integrity related to pressure, friction, moisture, and shear Goals: Patient will remain free from development of additional pressure ulcers Date Initiated: 10/11/2022 Target Resolution Date: 10/22/2022 Goal Status: Active Patient/caregiver will verbalize understanding of pressure ulcer management Date Initiated: 10/11/2022 Target Resolution Date: 10/22/2022 Goal Status: Active Interventions: Assess: immobility, friction, shearing, incontinence upon admission and as needed Assess offloading mechanisms upon admission and as needed Provide education on pressure ulcers Notes: Wound/Skin Impairment Nursing Diagnoses: Knowledge deficit related to ulceration/compromised skin  integrity Goals: Patient/caregiver will verbalize understanding of skin care regimen Date Initiated: 10/11/2022 Target Resolution Date: 10/22/2022 Goal Status: Active Interventions: Assess patient/caregiver ability to perform ulcer/skin care regimen upon admission and as needed Assess ulceration(s) every visit Provide education on ulcer and skin care Treatment Activities: Skin care regimen initiated : 10/11/2022 Topical wound management initiated : 10/11/2022 Notes: Electronic Signature(s) Signed: 11/23/2022 7:50:28 AM By: [name] Entered By: [name] on 11/01/2022 09:56:04 -------------------------------------------------------------------------------- Pain Assessment Details Patient Name: Date of Service: Carmen NDO N, [place] [name]. 11/01/2022 9:30 A M Medical Record Number: [id number] Patient Account Number: [id number] SISSIE, MARTUS ([id number]) [id number].pdf Page 6 of 8 Date of Birth/Sex: Treating RN: 08-May-1951 (72 y.o. [name] Primary Care Laylonie Marzec: [name] Other Clinician: Referring Suetta Hoffmeister: Treating Amadi Frady/Extender: [name] in Treatment: 3 Active Problems Location of Pain Severity and Description of Pain Patient Has Paino No Site Locations Pain Management and Medication Current Pain Management: Electronic Signature(s)  Signed: 11/23/2022 7:50:28 AM By: [name] Entered By: [name] on 11/01/2022 09:50:48 -------------------------------------------------------------------------------- Patient/Caregiver Education Details Patient Name: Date of Service: Carmen NDO N, MA Orlinda Blalock 5/20/2024andnbsp9:30 A M Medical Record Number: [id number] Patient Account Number: [id number] Date of Birth/Gender: Treating RN: Jun 21, 1950 (72 y.o. [name] Primary Care Physician: [name] Other Clinician: Referring Physician: Treating Physician/Extender: [name]  in Treatment: 3 Education Assessment Education Provided To: Patient Education Topics Provided Wound/Skin Impairment: Methods: Explain/Verbal Responses: State content correctly Electronic Signature(s) Signed: 11/23/2022 7:50:28 AM By: [name] Entered By: [name] on 11/01/2022 09:56:23 -------------------------------------------------------------------------------- Wound Assessment Details Patient Name: Date of Service: Carmen NDO N, [place] [name]. 11/01/2022 9:30 A M Medical Record Number: [id number] Patient Account Number: [id number] Date of Birth/Sex: Treating RN: 1951/02/01 (72 y.o. [name] Primary Care Chetan Mehring: [name] Other Clinician: [name] ([id number]) [id number] Page 7 of 8 Referring Dianara Smullen: Treating Shelaine Frie/Extender: [name] in Treatment: 3 Wound Status Wound Number: 1 Primary Etiology: Pressure Ulcer Wound Location: Left Gluteus Wound Status: Open Wounding Event: Pressure Injury Comorbid History: Cataracts, Lymphedema, Hypotension, Quadriplegia Date Acquired: 07/15/2022 Weeks Of Treatment: 3 Clustered Wound: No Photos Wound Measurements Length: (cm) 0.5 Width: (cm) 0.9 Depth: (cm) 2.6 Area: (cm) 0.353 Volume: (cm) 0.919 % Reduction in Area: 6.4% % Reduction in Volume: -87.6% Epithelialization: Small (1-33%) Tunneling: Yes Position (o'clock): 3 Maximum Distance: (cm) 2.6 Undermining: No Wound Description Classification: Category/Stage III Wound Margin: Distinct, outline attached Exudate Amount: Medium Exudate Type: Serosanguineous Exudate Color: red, brown Foul Odor After Cleansing: No Slough/Fibrino Yes Wound Bed Granulation Amount: Medium (34-66%) Exposed Structure Granulation Quality: Red, Pink Fascia Exposed: No Necrotic Amount: Medium (34-66%) Fat Layer (Subcutaneous Tissue) Exposed: Yes Necrotic Quality: Adherent Slough Tendon Exposed: No Muscle  Exposed: No Joint Exposed: No Bone Exposed: No Periwound Skin Texture Texture Color No Abnormalities Noted: No No Abnormalities Noted: No Callus: No Atrophie Blanche: No Crepitus: No Cyanosis: No Excoriation: No Ecchymosis: No Induration: No Erythema: No Rash: No Hemosiderin Staining: No Scarring: No Mottled: No Pallor: No Moisture Rubor: No No Abnormalities Noted: No Dry / Scaly: No Maceration: No Electronic Signature(s) Signed: 11/23/2022 7:50:28 AM By: [name] Entered By: [name] on 11/01/2022 09:54:07 [name] ([id number]) [id number] Page 8 of 8 -------------------------------------------------------------------------------- Vitals Details Patient Name: Date of Service: [name], [place] [name]. 11/01/2022 9:30 A M Medical Record Number: [id number] Patient Account Number: [id number] Date of Birth/Sex: Treating RN: 1951/03/31 (72 y.o. [name] Primary Care Kataleya Zaugg: [name] Other Clinician: Referring Minda Faas: Treating Abagael Kramm/Extender: [name] in Treatment: 3 Vital Signs Time Taken: 09:40 Temperature ([name]): 98.2 Height (in): 64 Pulse (bpm): 79 Weight (lbs): 115 Respiratory Rate (breaths/min): 18 Body Mass Index (BMI): 19.7 Blood Pressure (mmHg): 98/62 Reference Range: 80 - 120 mg / dl Electronic Signature(s) Signed: 11/23/2022 7:50:28 AM By: [name] Entered By: [name] on 11/01/2022 09:50:39

## 2022-11-23 NOTE — Progress Notes (Signed)
[name], [name] [id number] Page 1 of 7 Visit Report for 11/22/2022 Arrival Information Details Patient Name: Date of Service: Carmen NDO [place], [place] [name]. 11/22/2022 12:30 PM Medical Record Number: [id number] Patient Account Number: [id number] Date of Birth/Sex: Treating RN: February 09, 1951 (72 y.o. [name] Primary Care Sabeen Piechocki: Berniece Andreas Other Clinician: Referring Elias Dennington: Treating Sanjna Haskew/Extender: Skeet Latch in Treatment: 6 Visit Information History Since Last Visit All ordered tests and consults were completed: Yes Patient Arrived: Wheel Chair Added or deleted any medications: No Arrival Time: 12:37 Any new allergies or adverse reactions: No Accompanied By: caregiver Had a fall or experienced change in No Transfer Assistance: Transfer Board activities of daily living that may affect Patient Identification Verified: Yes risk of falls: Secondary Verification Process Completed: Yes Signs or symptoms of abuse/neglect since last visito No Patient Requires Transmission-Based Precautions: No Hospitalized since last visit: No Patient Has Alerts: No Implantable device outside of the clinic excluding No cellular tissue based products placed in the center since last visit: Has Dressing in Place as Prescribed: Yes Pain Present Now: No Electronic Signature(s) Signed: 11/23/2022 7:50:28 AM By: Brenton Grills Entered By: Brenton Grills on 11/22/2022 12:38:13 -------------------------------------------------------------------------------- Encounter Discharge Information Details Patient Name: Date of Service: [name], [name]. 11/22/2022 12:30 PM Medical Record Number: [id number] Patient Account Number: [id number] Date of Birth/Sex: Treating RN: December 01, 1950 (72 y.o. [name] Primary Care Micha Dosanjh: Berniece Andreas Other Clinician: Referring Kenyonna Micek: Treating Keayra Graham/Extender: Skeet Latch in Treatment: 6 Encounter Discharge Information Items Discharge Condition: Stable Ambulatory Status: Wheelchair Discharge Destination: Home Transportation: Private Auto Accompanied By: nurse Schedule Follow-up Appointment: Yes Clinical Summary of Care: Patient Declined Electronic Signature(s) Signed: 11/23/2022 7:50:28 AM By: Brenton Grills Entered By: Brenton Grills on 11/22/2022 13:23:42 -------------------------------------------------------------------------------- Lower Extremity Assessment Details Patient Name: Date of Service: [name], [name]. 11/22/2022 12:30 PM Medical Record Number: [id number] Patient Account Number: [id number] Date of Birth/Sex: Treating RN: 17-Feb-1951 (72 y.o. [name] Primary Care Trent Gabler: Berniece Andreas Other Clinician: Referring Jhony Antrim: Treating Dillan Lunden/Extender: Skeet Latch in Treatment: 6 Electronic Signature(s) Signed: 11/23/2022 7:50:28 AM By: Steffanie Rainwater, Mare Loan ([id number]) By: Brenton Grills [phone].pdf Page 2 of 7 Signed: 11/23/2022 7:50:28 AM Entered By: Brenton Grills on 11/22/2022 12:50:13 -------------------------------------------------------------------------------- Multi Wound Chart Details Patient Name: Date of Service: [name], [name]. 11/22/2022 12:30 PM Medical Record Number: [id number] Patient Account Number: [id number] Date of Birth/Sex: Treating RN: [birth year] (72 y.o. [name]) Primary Care Nate Perri: Berniece Andreas Other Clinician: Referring Kitara Hebb: Treating Dianelly Ferran/Extender: Skeet Latch in Treatment: 6 Vital Signs Height(in): 64 Pulse(bpm): 105 Weight(lbs): 115 Blood Pressure(mmHg): 98/59 Body Mass Index(BMI): 19.7 Temperature([name]): 98.4 Respiratory Rate(breaths/min): 18 [1:Photos:] [[name]/A:[name]/A] Left Gluteus [name]/A [name]/A Wound Location: Pressure Injury [name]/A [name]/A Wounding Event: Pressure Ulcer [name]/A [name]/A Primary  Etiology: Cataracts, Lymphedema, [name]/A [name]/A Comorbid History: Hypotension, Quadriplegia 07/15/2022 [name]/A [name]/A Date Acquired: 6 [name]/A [name]/A Weeks of Treatment: Open [name]/A [name]/A Wound Status: No [name]/A [name]/A Wound Recurrence: 0.3x0.4x0.5 [name]/A [name]/A Measurements L x W x D (cm) 0.094 [name]/A [name]/A A (cm) : rea 0.047 [name]/A [name]/A Volume (cm) : 75.10% [name]/A [name]/A % Reduction in A rea: 90.40% [name]/A [name]/A % Reduction in Volume: 1 Position 1 (o'clock): 4.2 Maximum Distance 1 (cm): Yes [name]/A [name]/A Tunneling: Category/Stage III [name]/A [name]/A Classification: Medium [name]/A [name]/A Exudate A mount: Serosanguineous [name]/A [name]/A Exudate Type: red, brown [name]/A [name]/A Exudate Color: Distinct, outline attached [name]/A [name]/A  Wound Margin: Medium (34-66%) [name]/A [name]/A Granulation A mount: Red, Pink [name]/A [name]/A Granulation Quality: Medium (34-66%) [name]/A [name]/A Necrotic A mount: Fat Layer (Subcutaneous Tissue): Yes [name]/A [name]/A Exposed Structures: Fascia: No Tendon: No Muscle: No Joint: No Bone: No Small (1-33%) [name]/A [name]/A Epithelialization: Excoriation: No [name]/A [name]/A Periwound Skin Texture: Induration: No Callus: No Crepitus: No Rash: No Scarring: No Maceration: No [name]/A [name]/A Periwound Skin Moisture: Dry/Scaly: No Atrophie Blanche: No [name]/A [name]/A Periwound Skin Color: Cyanosis: No Ecchymosis: No Erythema: No Hemosiderin Staining: No Mottled: No Pallor: No Rubor: No [name], [name] ([id number]) [id number] Page 3 of 7 Treatment Notes Electronic Signature(s) Signed: 11/22/2022 4:48:17 PM By: Geralyn Corwin DO Entered By: Geralyn Corwin on 11/22/2022 13:00:46 -------------------------------------------------------------------------------- Multi-Disciplinary Care Plan Details Patient Name: Date of Service: [name], [name]. 11/22/2022 12:30 PM Medical Record Number: [id number] Patient Account Number: [id number] Date of Birth/Sex: Treating RN: Oct 01, 1950 (72 y.o. [name] Primary Care Smayan Hackbart: Berniece Andreas Other  Clinician: Referring Dimitrious Micciche: Treating Gissel Keilman/Extender: Skeet Latch in Treatment: 6 Active Inactive Nutrition Nursing Diagnoses: Potential for alteratiion in Nutrition/Potential for imbalanced nutrition Goals: Patient/caregiver agrees to and verbalizes understanding of need to obtain nutritional consultation Date Initiated: 10/11/2022 Target Resolution Date: 10/21/2022 Goal Status: Active Interventions: Provide education on elevated blood sugars and impact on wound healing Provide education on nutrition Treatment Activities: Patient referred to Primary Care Physician for further nutritional evaluation : 10/11/2022 Notes: Orientation to the Wound Care Program Nursing Diagnoses: Knowledge deficit related to the wound healing center program Goals: Patient/caregiver will verbalize understanding of the Wound Healing Center Program Date Initiated: 10/11/2022 Target Resolution Date: 10/22/2022 Goal Status: Active Interventions: Provide education on orientation to the wound center Notes: Pressure Nursing Diagnoses: Potential for impaired tissue integrity related to pressure, friction, moisture, and shear Goals: Patient will remain free from development of additional pressure ulcers Date Initiated: 10/11/2022 Target Resolution Date: 10/22/2022 Goal Status: Active Patient/caregiver will verbalize understanding of pressure ulcer management Date Initiated: 10/11/2022 Target Resolution Date: 10/22/2022 Goal Status: Active Interventions: Assess: immobility, friction, shearing, incontinence upon admission and as needed Assess offloading mechanisms upon admission and as needed Provide education on pressure ulcers Notes: [name], [name] ([id number]) [phone].pdf Page 4 of 7 Wound/Skin Impairment Nursing Diagnoses: Knowledge deficit related to ulceration/compromised skin integrity Goals: Patient/caregiver will verbalize understanding of  skin care regimen Date Initiated: 10/11/2022 Target Resolution Date: 10/22/2022 Goal Status: Active Interventions: Assess patient/caregiver ability to perform ulcer/skin care regimen upon admission and as needed Assess ulceration(s) every visit Provide education on ulcer and skin care Treatment Activities: Skin care regimen initiated : 10/11/2022 Topical wound management initiated : 10/11/2022 Notes: Electronic Signature(s) Signed: 11/23/2022 7:50:28 AM By: Brenton Grills Entered By: Brenton Grills on 11/22/2022 12:59:00 -------------------------------------------------------------------------------- Negative Pressure Wound Therapy Maintenance (NPWT) Details Patient Name: Date of Service: [name], [name]. 11/22/2022 12:30 PM Medical Record Number: [id number] Patient Account Number: [id number] Date of Birth/Sex: Treating RN: [birth year] (73 y.o. [name] Primary Care Romel Dumond: Berniece Andreas Other Clinician: Referring Gene Colee: Treating Jylan Loeza/Extender: Skeet Latch in Treatment: 6 NPWT Maintenance Performed for: Wound #1 Left Gluteus Performed By: Brenton Grills, RN Type: VAC System Coverage Size (sq cm): 0.12 Pressure Type: Constant Pressure Setting: 125 mmHG Drain Type: None Primary Contact: None Sponge/Dressing Type: Foam- Black Date Initiated: 11/16/2022 Dressing Removed: Yes Quantity of Sponges/Gauze Removed: 2 Canister Changed: No Canister Exudate Volume: 20 Dressing Reapplied: Yes Quantity of Sponges/Gauze Inserted: 1 Days On NPWT : 7  Post Procedure Diagnosis Same as Pre-procedure Notes Scribed for Dr Mikey Bussing by Brenton Grills RN. Electronic Signature(s) Signed: 11/23/2022 7:50:28 AM By: Brenton Grills Entered By: Brenton Grills on 11/22/2022 13:22:05 -------------------------------------------------------------------------------- Pain Assessment Details Patient Name: Date of Service: [name], [name]. 11/22/2022 12:30  PM Medical Record Number: [id number] Patient Account Number: [id number] Date of Birth/Sex: Treating RN: 01-19-1951 (72 y.o. [name] Primary Care Warren Lindahl: Berniece Andreas Other Clinician: Referring Kenneth Lax: Treating Aleister Lady/Extender: [name], [name], Mare Loan ([id number]) [id number] Page 5 of 7 Weeks in Treatment: 6 Active Problems Location of Pain Severity and Description of Pain Patient Has Paino No Site Locations Pain Management and Medication Current Pain Management: Electronic Signature(s) Signed: 11/23/2022 7:50:28 AM By: Brenton Grills Entered By: Brenton Grills on 11/22/2022 12:50:06 -------------------------------------------------------------------------------- Patient/Caregiver Education Details Patient Name: Date of Service: [name], [name]. 6/10/2024andnbsp12:30 PM Medical Record Number: [id number] Patient Account Number: [id number] Date of Birth/Gender: Treating RN: 05/24/1951 (72 y.o. [name] Primary Care Physician: Berniece Andreas Other Clinician: Referring Physician: Treating Physician/Extender: Skeet Latch in Treatment: 6 Education Assessment Education Provided To: Patient and Caregiver Education Topics Provided Wound/Skin Impairment: Methods: Explain/Verbal Responses: State content correctly Electronic Signature(s) Signed: 11/23/2022 7:50:28 AM By: Brenton Grills Entered By: Brenton Grills on 11/22/2022 12:49:23 -------------------------------------------------------------------------------- Wound Assessment Details Patient Name: Date of Service: [name], [name]. 11/22/2022 12:30 PM Medical Record Number: [id number] Patient Account Number: [id number] Date of Birth/Sex: Treating RN: 10/03/1950 (72 y.o. [name] Primary Care Bralin Garry: Berniece Andreas Other Clinician: Referring Doreatha Offer: Treating Ikeya Brockel/Extender: Skeet Latch in Treatment: 6 [name], [name] ([id number]) [id number] Page 6 of 7 Wound Status Wound Number: 1 Primary Etiology: Pressure Ulcer Wound Location: Left Gluteus Wound Status: Open Wounding Event: Pressure Injury Comorbid History: Cataracts, Lymphedema, Hypotension, Quadriplegia Date Acquired: 07/15/2022 Weeks Of Treatment: 6 Clustered Wound: No Photos Wound Measurements Length: (cm) 0.3 Width: (cm) 0.4 Depth: (cm) 0.5 Area: (cm) 0.094 Volume: (cm) 0.047 % Reduction in Area: 75.1% % Reduction in Volume: 90.4% Epithelialization: Small (1-33%) Tunneling: Yes Position (o'clock): 1 Maximum Distance: (cm) 4.2 Undermining: No Wound Description Classification: Category/Stage III Wound Margin: Distinct, outline attached Exudate Amount: Medium Exudate Type: Serosanguineous Exudate Color: red, brown Foul Odor After Cleansing: No Slough/Fibrino Yes Wound Bed Granulation Amount: Medium (34-66%) Exposed Structure Granulation Quality: Red, Pink Fascia Exposed: No Necrotic Amount: Medium (34-66%) Fat Layer (Subcutaneous Tissue) Exposed: Yes Necrotic Quality: Adherent Slough Tendon Exposed: No Muscle Exposed: No Joint Exposed: No Bone Exposed: No Periwound Skin Texture Texture Color No Abnormalities Noted: No No Abnormalities Noted: No Callus: No Atrophie Blanche: No Crepitus: No Cyanosis: No Excoriation: No Ecchymosis: No Induration: No Erythema: No Rash: No Hemosiderin Staining: No Scarring: No Mottled: No Pallor: No Moisture Rubor: No No Abnormalities Noted: No Dry / Scaly: No Maceration: No Treatment Notes Wound #1 (Gluteus) Wound Laterality: Left Cleanser Vashe 5.8 (oz) Discharge Instruction: Cleanse the wound with Vashe prior to applying a clean dressing using gauze sponges, not tissue or cotton balls. Peri-Wound Care Topical [name], [name] ([id number]) [id number] Page 7 of 7 Primary  Dressing Hydrofera Blue Classic Foam Rope Dressing, 9x6 (mm/in) Discharge Instruction: Moisten with saline prior to packing Secondary Dressing Bordered Gauze, 4x4 in Discharge Instruction: Apply over primary dressing as directed. Secured With 57M Medipore H Soft Cloth Surgical T ape, 4 x 10 (in/yd) Discharge Instruction: Secure with tape as directed. Compression Wrap Compression Stockings Add-Ons Electronic Signature(s) Signed: 11/23/2022  7:50:28 AM By: Brenton Grills Entered By: Brenton Grills on 11/22/2022 12:47:46 -------------------------------------------------------------------------------- Vitals Details Patient Name: Date of Service: [name], [name]. 11/22/2022 12:30 PM Medical Record Number: [id number] Patient Account Number: [id number] Date of Birth/Sex: Treating RN: Oct 30, 1950 (72 y.o. [name] Primary Care Milyn Stapleton: Berniece Andreas Other Clinician: Referring Dewitt Judice: Treating Aeryn Medici/Extender: Skeet Latch in Treatment: 6 Vital Signs Time Taken: 12:49 Temperature ([name]): 98.4 Height (in): 64 Pulse (bpm): 105 Weight (lbs): 115 Respiratory Rate (breaths/min): 18 Body Mass Index (BMI): 19.7 Blood Pressure (mmHg): 98/59 Reference Range: 80 - 120 mg / dl Electronic Signature(s) Signed: 11/23/2022 7:50:28 AM By: Brenton Grills Entered By: Brenton Grills on 11/22/2022 12:49:57

## 2022-11-23 NOTE — Progress Notes (Signed)
[name], [name] [id number] Page 1 of 7 Visit Report for 10/25/2022 Arrival Information Details Patient Name: Date of Service: Carmen NDO [place], [place] [name]. 10/25/2022 3:30 PM Medical Record Number: [id number] Patient Account Number: [id number] Date of Birth/Sex: Treating RN: [birth year] (72 y.o. [name]) Primary Care Jaslin Novitski: Berniece Andreas Other Clinician: Referring Deaisha Welborn: Treating Fritzi Scripter/Extender: Skeet Latch in Treatment: 2 Visit Information History Since Last Visit All ordered tests and consults were completed: No Patient Arrived: Wheel Chair Added or deleted any medications: No Arrival Time: 15:35 Any new allergies or adverse reactions: No Accompanied By: caregiver Had a fall or experienced change in No Transfer Assistance: Transfer Board activities of daily living that may affect Patient Identification Verified: Yes risk of falls: Secondary Verification Process Completed: Yes Signs or symptoms of abuse/neglect since last visito No Patient Requires Transmission-Based Precautions: No Hospitalized since last visit: No Patient Has Alerts: No Implantable device outside of the clinic excluding No cellular tissue based products placed in the center since last visit: Pain Present Now: No Electronic Signature(s) Signed: 10/26/2022 11:08:52 AM By: Dayton Scrape Entered By: Dayton Scrape on 10/25/2022 15:35:37 -------------------------------------------------------------------------------- Clinic Level of Care Assessment Details Patient Name: Date of Service: [name], [place] [name]. 10/25/2022 3:30 PM Medical Record Number: [id number] Patient Account Number: [id number] Date of Birth/Sex: Treating RN: Jun 13, 1951 (72 y.o. [name]) Primary Care Seraphim Affinito: Berniece Andreas Other Clinician: Referring Omie Ferger: Treating Salem Lembke/Extender: Skeet Latch in Treatment: 2 Clinic Level of Care Assessment Items TOOL 1 Quantity  Score X- 1 0 Use when EandM and Procedure is performed on INITIAL visit ASSESSMENTS - Nursing Assessment / Reassessment X- 1 20 General Physical Exam (combine w/ comprehensive assessment (listed just below) when performed on new pt. evals) X- 1 25 Comprehensive Assessment (HX, ROS, Risk Assessments, Wounds Hx, etc.) ASSESSMENTS - Wound and Skin Assessment / Reassessment X- 1 10 Dermatologic / Skin Assessment (not related to wound area) ASSESSMENTS - Ostomy and/or Continence Assessment and Care []  - 0 Incontinence Assessment and Management []  - 0 Ostomy Care Assessment and Management (repouching, etc.) PROCESS - Coordination of Care X - Simple Patient / Family Education for ongoing care 1 15 []  - 0 Complex (extensive) Patient / Family Education for ongoing care X- 1 10 Staff obtains Chiropractor, Records, T Results / Process Orders est []  - 0 Staff telephones HHA, Nursing Homes / Clarify orders / etc []  - 0 Routine Transfer to another Facility (non-emergent condition) []  - 0 Routine Hospital Admission (non-emergent condition) []  - 0 New Admissions / Insurance Authorizations / Ordering NPWT Apligraf, etcDELMY, [name] ([id number]) [id number] Page 2 of 7 []  - 0 Emergency Hospital Admission (emergent condition) PROCESS - Special Needs []  - 0 Pediatric / Minor Patient Management []  - 0 Isolation Patient Management []  - 0 Hearing / Language / Visual special needs []  - 0 Assessment of Community assistance (transportation, D/C planning, etc.) []  - 0 Additional assistance / Altered mentation []  - 0 Support Surface(s) Assessment (bed, cushion, seat, etc.) INTERVENTIONS - Miscellaneous []  - 0 External ear exam []  - 0 Patient Transfer (multiple staff / Nurse, adult / Similar devices) []  - 0 Simple Staple / Suture removal (25 or less) []  - 0 Complex Staple / Suture removal (26 or more) []  - 0 Hypo/Hyperglycemic Management (do not check if  billed separately) []  - 0 Ankle / Brachial Index (ABI) - do not check if billed separately Has the patient been seen at the hospital within  the last three years: Yes Total Score: 80 Level Of Care: New/Established - Level 3 Electronic Signature(s) Signed: 10/26/2022 11:08:52 AM By: Dayton Scrape Entered By: Dayton Scrape on 10/25/2022 16:02:19 -------------------------------------------------------------------------------- Encounter Discharge Information Details Patient Name: Date of Service: Carmen NDO N, [place] [name]. 10/25/2022 3:30 PM Medical Record Number: [id number] Patient Account Number: [id number] Date of Birth/Sex: Treating RN: 1950-10-22 (72 y.o. [name]) Primary Care Shanieka Blea: Berniece Andreas Other Clinician: Referring Michaelangelo Mittelman: Treating Samuel Rittenhouse/Extender: Skeet Latch in Treatment: 2 Encounter Discharge Information Items Discharge Condition: Stable Ambulatory Status: Wheelchair Discharge Destination: Home Transportation: Private Auto Accompanied By: caregiver Schedule Follow-up Appointment: Yes Clinical Summary of Care: Patient Declined Electronic Signature(s) Signed: 10/26/2022 11:08:52 AM By: Dayton Scrape Entered By: Dayton Scrape on 10/25/2022 16:15:20 -------------------------------------------------------------------------------- Lower Extremity Assessment Details Patient Name: Date of Service: Carmen NDO N, MA [name]. 10/25/2022 3:30 PM Medical Record Number: [id number] Patient Account Number: [id number] Date of Birth/Sex: Treating RN: 06/30/1950 (72 y.o. [name]) Primary Care Katelyn Broadnax: Berniece Andreas Other Clinician: Referring Netra Postlethwait: Treating Daisie Haft/Extender: Skeet Latch in Treatment: 2 Electronic Signature(s) Signed: 10/26/2022 11:08:52 AM By: Dayton Scrape Entered By: Dayton Scrape on 10/25/2022 15:47:18 [name] ([id number]) [id number] Page 3 of  7 -------------------------------------------------------------------------------- Multi Wound Chart Details Patient Name: Date of Service: [name], [place] [name]. 10/25/2022 3:30 PM Medical Record Number: [id number] Patient Account Number: [id number] Date of Birth/Sex: Treating RN: 11/12/1950 (72 y.o. [name]) Primary Care Deyana Wnuk: Berniece Andreas Other Clinician: Referring Mckaylah Bettendorf: Treating Atreus Hasz/Extender: Skeet Latch in Treatment: 2 Vital Signs Height(in): 64 Pulse(bpm): 83 Weight(lbs): 115 Blood Pressure(mmHg): 98/62 Body Mass Index(BMI): 19.7 Temperature([name]): 98.2 Respiratory Rate(breaths/min): 18 [1:Photos:] [[name]/A:[name]/A] Left Gluteus [name]/A [name]/A Wound Location: Pressure Injury [name]/A [name]/A Wounding Event: Pressure Ulcer [name]/A [name]/A Primary Etiology: Cataracts, Lymphedema, [name]/A [name]/A Comorbid History: Hypotension, Quadriplegia 07/15/2022 [name]/A [name]/A Date Acquired: 2 [name]/A [name]/A Weeks of Treatment: Open [name]/A [name]/A Wound Status: No [name]/A [name]/A Wound Recurrence: 0.5x0.9x0.8 [name]/A [name]/A Measurements L x W x D (cm) 0.353 [name]/A [name]/A A (cm) : rea 0.283 [name]/A [name]/A Volume (cm) : 6.40% [name]/A [name]/A % Reduction in A rea: 42.20% [name]/A [name]/A % Reduction in Volume: 3 Position 1 (o'clock): 1.5 Maximum Distance 1 (cm): Yes [name]/A [name]/A Tunneling: Category/Stage III [name]/A [name]/A Classification: Medium [name]/A [name]/A Exudate A mount: Serosanguineous [name]/A [name]/A Exudate Type: red, brown [name]/A [name]/A Exudate Color: Distinct, outline attached [name]/A [name]/A Wound Margin: Medium (34-66%) [name]/A [name]/A Granulation A mount: Red, Pink [name]/A [name]/A Granulation Quality: Medium (34-66%) [name]/A [name]/A Necrotic A mount: Fat Layer (Subcutaneous Tissue): Yes [name]/A [name]/A Exposed Structures: Fascia: No Tendon: No Muscle: No Joint: No Bone: No Small (1-33%) [name]/A [name]/A Epithelialization: Excoriation: No [name]/A [name]/A Periwound Skin Texture: Induration: No Callus: No Crepitus: No Rash: No Scarring: No Maceration: No [name]/A [name]/A Periwound Skin  Moisture: Dry/Scaly: No Atrophie Blanche: No [name]/A [name]/A Periwound Skin Color: Cyanosis: No Ecchymosis: No Erythema: No Hemosiderin Staining: No Mottled: No Pallor: No Rubor: No [name], [name] ([id number]) [id number] Page 4 of 7 Treatment Notes Electronic Signature(s) Signed: 10/25/2022 4:07:16 PM By: Geralyn Corwin DO Entered By: Geralyn Corwin on 10/25/2022 16:03:51 -------------------------------------------------------------------------------- Multi-Disciplinary Care Plan Details Patient Name: Date of Service: Carmen NDO N, MA [name]. 10/25/2022 3:30 PM Medical Record Number: [id number] Patient Account Number: [id number] Date of Birth/Sex: Treating RN: 04-21-1951 (72 y.o. [name]) Primary Care Ervan Heber: Berniece Andreas Other Clinician: Referring Aixa Corsello: Treating Graceann Boileau/Extender: Skeet Latch in Treatment: 2 Active Inactive Nutrition Nursing Diagnoses: Potential for  alteratiion in Nutrition/Potential for imbalanced nutrition Goals: Patient/caregiver agrees to and verbalizes understanding of need to obtain nutritional consultation Date Initiated: 10/11/2022 Target Resolution Date: 10/21/2022 Goal Status: Active Interventions: Provide education on elevated blood sugars and impact on wound healing Provide education on nutrition Treatment Activities: Patient referred to Primary Care Physician for further nutritional evaluation : 10/11/2022 Notes: Orientation to the Wound Care Program Nursing Diagnoses: Knowledge deficit related to the wound healing center program Goals: Patient/caregiver will verbalize understanding of the Wound Healing Center Program Date Initiated: 10/11/2022 Target Resolution Date: 10/22/2022 Goal Status: Active Interventions: Provide education on orientation to the wound center Notes: Pressure Nursing Diagnoses: Potential for impaired tissue integrity related to pressure, friction, moisture, and  shear Goals: Patient will remain free from development of additional pressure ulcers Date Initiated: 10/11/2022 Target Resolution Date: 10/22/2022 Goal Status: Active Patient/caregiver will verbalize understanding of pressure ulcer management Date Initiated: 10/11/2022 Target Resolution Date: 10/22/2022 Goal Status: Active Interventions: Assess: immobility, friction, shearing, incontinence upon admission and as needed Assess offloading mechanisms upon admission and as needed Provide education on pressure ulcers Notes: [name], [name] ([id number]) [phone].pdf Page 5 of 7 Wound/Skin Impairment Nursing Diagnoses: Knowledge deficit related to ulceration/compromised skin integrity Goals: Patient/caregiver will verbalize understanding of skin care regimen Date Initiated: 10/11/2022 Target Resolution Date: 10/22/2022 Goal Status: Active Interventions: Assess patient/caregiver ability to perform ulcer/skin care regimen upon admission and as needed Assess ulceration(s) every visit Provide education on ulcer and skin care Treatment Activities: Skin care regimen initiated : 10/11/2022 Topical wound management initiated : 10/11/2022 Notes: Electronic Signature(s) Signed: 10/26/2022 11:08:52 AM By: Dayton Scrape Entered By: Dayton Scrape on 10/25/2022 15:47:59 -------------------------------------------------------------------------------- Pain Assessment Details Patient Name: Date of Service: Carmen NDO N, MA [name]. 10/25/2022 3:30 PM Medical Record Number: [id number] Patient Account Number: [id number] Date of Birth/Sex: Treating RN: 11-09-1950 (72 y.o. [name]) Primary Care Ethanael Veith: Berniece Andreas Other Clinician: Referring Glenisha Gundry: Treating Eames Dibiasio/Extender: Skeet Latch in Treatment: 2 Active Problems Location of Pain Severity and Description of Pain Patient Has Paino Yes Site Locations Rate the pain. Current Pain Level: 6 Worst Pain Level:  10 Least Pain Level: 2 Pain Management and Medication Current Pain Management: Electronic Signature(s) Signed: 10/26/2022 11:08:52 AM By: Dayton Scrape Entered By: Dayton Scrape on 10/25/2022 15:36:39 -------------------------------------------------------------------------------- Patient/Caregiver Education Details Patient Name: Date of Service: Carmen NDO N, MA [name]. 5/13/2024andnbsp3:30 PM [name] ([id number]) [id number] Page 6 of 7 Medical Record Number: [id number] Patient Account Number: [id number] Date of Birth/Gender: Treating RN: Oct 23, 1950 (72 y.o. [name]) Primary Care Physician: Berniece Andreas Other Clinician: Referring Physician: Treating Physician/Extender: Skeet Latch in Treatment: 2 Education Assessment Education Provided To: Patient and Caregiver Education Topics Provided Wound/Skin Impairment: Methods: Explain/Verbal Responses: State content correctly Electronic Signature(s) Signed: 10/26/2022 11:08:52 AM By: Dayton Scrape Entered By: Dayton Scrape on 10/25/2022 15:48:33 -------------------------------------------------------------------------------- Wound Assessment Details Patient Name: Date of Service: Carmen NDO N, MA [name]. 10/25/2022 3:30 PM Medical Record Number: [id number] Patient Account Number: [id number] Date of Birth/Sex: Treating RN: [birth year] (72 y.o. [name]) Primary Care Leilan Bochenek: Berniece Andreas Other Clinician: Referring Ford Peddie: Treating Rosalee Tolley/Extender: Skeet Latch in Treatment: 2 Wound Status Wound Number: 1 Primary Etiology: Pressure Ulcer Wound Location: Left Gluteus Wound Status: Open Wounding Event: Pressure Injury Comorbid History: Cataracts, Lymphedema, Hypotension, Quadriplegia Date Acquired: 07/15/2022 Weeks Of Treatment: 2 Clustered Wound: No Photos Wound Measurements Length: (cm) 0.5 Width: (cm) 0.9 Depth: (cm) 0.8 Area: (cm) 0.353 Volume: (cm) 0.283 %  Reduction  in Area: 6.4% % Reduction in Volume: 42.2% Epithelialization: Small (1-33%) Tunneling: Yes Position (o'clock): 3 Maximum Distance: (cm) 1.5 Undermining: No Wound Description Classification: Category/Stage III Wound Margin: Distinct, outline attached Exudate Amount: Medium Exudate Type: Serosanguineous Exudate Color: red, brown [name], [name] ([id number]) Wound Bed Granulation Amount: Medium (34-66%) Granulation Quality: Red, Pink Necrotic Amount: Medium (34-66%) Necrotic Quality: Adherent Slough Foul Odor After Cleansing: No Slough/Fibrino Yes [phone].pdf Page 7 of 7 Exposed Structure Fascia Exposed: No Fat Layer (Subcutaneous Tissue) Exposed: Yes Tendon Exposed: No Muscle Exposed: No Joint Exposed: No Bone Exposed: No Periwound Skin Texture Texture Color No Abnormalities Noted: No No Abnormalities Noted: No Callus: No Atrophie Blanche: No Crepitus: No Cyanosis: No Excoriation: No Ecchymosis: No Induration: No Erythema: No Rash: No Hemosiderin Staining: No Scarring: No Mottled: No Pallor: No Moisture Rubor: No No Abnormalities Noted: No Dry / Scaly: No Maceration: No Electronic Signature(s) Signed: 11/23/2022 7:49:05 AM By: Brenton Grills Entered By: Brenton Grills on 10/25/2022 15:45:50 -------------------------------------------------------------------------------- Vitals Details Patient Name: Date of Service: Carmen NDO N, MA [name]. 10/25/2022 3:30 PM Medical Record Number: [id number] Patient Account Number: [id number] Date of Birth/Sex: Treating RN: [birth year] (72 y.o. [name]) Primary Care Marshon Bangs: Berniece Andreas Other Clinician: Referring Normon Pettijohn: Treating Chiniqua Kilcrease/Extender: Skeet Latch in Treatment: 2 Vital Signs Time Taken: 03:35 Temperature ([name]): 98.2 Height (in): 64 Pulse (bpm): 83 Weight (lbs): 115 Respiratory Rate (breaths/min): 18 Body Mass Index (BMI): 19.7 Blood Pressure  (mmHg): 98/62 Reference Range: 80 - 120 mg / dl Electronic Signature(s) Signed: 10/26/2022 11:08:52 AM By: Dayton Scrape Entered By: Dayton Scrape on 10/25/2022 15:36:03
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
## 2022-11-23 NOTE — Progress Notes (Signed)
[name], MASCI F ([id number]) [id number] Page 1 of 8 Visit Report for 10/19/2022 Arrival Information Details Patient Name: Date of Service: BRA NDO [place], [name] 10/19/2022 9:30 A M Medical Record Number: [id number] Patient Account Number: [id number] Date of Birth/Sex: Treating RN: [birth year] (72 y.o. Gevena [name] Primary Care Monalisa Bayless: Berniece Andreas Other Clinician: Referring [name]: Treating [name]/Extender: Tanja Port in Treatment: 1 Visit Information History Since Last Visit All ordered tests and consults were completed: Yes Patient Arrived: Wheel Chair Added or deleted any medications: No Arrival Time: 09:26 Any new allergies or adverse reactions: No Accompanied By: caregiver Had a fall or experienced change in No Transfer Assistance: Transfer Board activities of daily living that may affect Patient Identification Verified: Yes risk of falls: Secondary Verification Process Completed: Yes Signs or symptoms of abuse/neglect since last visito No Patient Requires Transmission-Based Precautions: No Hospitalized since last visit: No Patient Has Alerts: No Implantable device outside of the clinic excluding No cellular tissue based products placed in the center since last visit: Has Dressing in Place as Prescribed: Yes Pain Present Now: No Electronic Signature(s) Signed: 11/23/2022 7:49:05 AM By: Brenton Grills Entered By: Brenton Grills on 10/19/2022 09:34:14 -------------------------------------------------------------------------------- Clinic Level of Care Assessment Details Patient Name: Date of Service: [name], MA Carmen F. 10/19/2022 9:30 A M Medical Record Number: [id number] Patient Account Number: [id number] Date of Birth/Sex: Treating RN: [birth year] (72 y.o. Gevena [name] Primary Care Shihab States: Berniece Andreas Other Clinician: Referring [name]: Treating [name]/Extender: Tanja Port in Treatment: 1 Clinic Level of Care Assessment Items TOOL 4 Quantity Score X- 1 0 Use when only an EandM is performed on FOLLOW-UP visit ASSESSMENTS - Nursing Assessment / Reassessment X- 1 10 Reassessment of Co-morbidities (includes updates in patient status) X- 1 5 Reassessment of Adherence to Treatment Plan ASSESSMENTS - Wound and Skin A ssessment / Reassessment X - Simple Wound Assessment / Reassessment - one wound 1 5 []  - 0 Complex Wound Assessment / Reassessment - multiple wounds []  - 0 Dermatologic / Skin Assessment (not related to wound area) ASSESSMENTS - Focused Assessment []  - 0 Circumferential Edema Measurements - multi extremities []  - 0 Nutritional Assessment / Counseling / Intervention []  - 0 Lower Extremity Assessment (monofilament, tuning fork, pulses) []  - 0 Peripheral Arterial Disease Assessment (using hand held doppler) ASSESSMENTS - Ostomy and/or Continence Assessment and Care []  - 0 Incontinence Assessment and Management []  - 0 Ostomy Care Assessment and Management (repouching, etc.) PROCESS - Coordination of Care JAMIRACLE, AVANTS ([id number]) [id number] Page 2 of 8 X- 1 15 Simple Patient / Family Education for ongoing care []  - 0 Complex (extensive) Patient / Family Education for ongoing care X- 1 10 Staff obtains Chiropractor, Records, T Results / Process Orders est []  - 0 Staff telephones HHA, Nursing Homes / Clarify orders / etc []  - 0 Routine Transfer to another Facility (non-emergent condition) []  - 0 Routine Hospital Admission (non-emergent condition) []  - 0 New Admissions / Manufacturing engineer / Ordering NPWT Apligraf, etc. , []  - 0 Emergency Hospital Admission (emergent condition) X- 1 10 Simple Discharge Coordination []  - 0 Complex (extensive) Discharge Coordination PROCESS - Special Needs []  - 0 Pediatric / Minor Patient Management []  - 0 Isolation Patient Management []  - 0 Hearing  / Language / Visual special needs []  - 0 Assessment of Community assistance (transportation, D/C planning, etc.) []  - 0 Additional assistance / Altered mentation []  - 0 Support Surface(s) Assessment (bed, cushion,  seat, etc.) INTERVENTIONS - Wound Cleansing / Measurement X - Simple Wound Cleansing - one wound 1 5 []  - 0 Complex Wound Cleansing - multiple wounds []  - 0 Wound Imaging (photographs - any number of wounds) X- 1 5 Wound Tracing (instead of photographs) X- 1 5 Simple Wound Measurement - one wound []  - 0 Complex Wound Measurement - multiple wounds INTERVENTIONS - Wound Dressings X - Small Wound Dressing one or multiple wounds 1 10 []  - 0 Medium Wound Dressing one or multiple wounds []  - 0 Large Wound Dressing one or multiple wounds []  - 0 Application of Medications - topical []  - 0 Application of Medications - injection INTERVENTIONS - Miscellaneous []  - 0 External ear exam []  - 0 Specimen Collection (cultures, biopsies, blood, body fluids, etc.) []  - 0 Specimen(s) / Culture(s) sent or taken to Lab for analysis []  - 0 Patient Transfer (multiple staff / Nurse, adult / Similar devices) []  - 0 Simple Staple / Suture removal (25 or less) []  - 0 Complex Staple / Suture removal (26 or more) []  - 0 Hypo / Hyperglycemic Management (close monitor of Blood Glucose) []  - 0 Ankle / Brachial Index (ABI) - do not check if billed separately X- 1 5 Vital Signs Has the patient been seen at the hospital within the last three years: Yes Total Score: 85 Level Of Care: New/Established - Level 3 Electronic Signature(s) Signed: 11/23/2022 7:49:05 AM By: Steffanie Rainwater, Jola Babinski F ([id number]) [id number] Page 3 of 8 Entered By: Brenton Grills on 10/19/2022 10:20:14 -------------------------------------------------------------------------------- Encounter Discharge Information Details Patient Name: Date of Service: [name], Carmen Carmen F.  10/19/2022 9:30 A M Medical Record Number: [id number] Patient Account Number: [id number] Date of Birth/Sex: Treating RN: [birth year] (72 y.o. Gevena [name] Primary Care Alieu Finnigan: Berniece Andreas Other Clinician: Referring [name]: Treating Dayvin Aber/Extender: Tanja Port in Treatment: 1 Encounter Discharge Information Items Discharge Condition: Stable Ambulatory Status: Wheelchair Discharge Destination: Home Transportation: Private Auto Accompanied By: caregiver Schedule Follow-up Appointment: Yes Clinical Summary of Care: Patient Declined Electronic Signature(s) Signed: 11/23/2022 7:49:05 AM By: Brenton Grills Entered By: Brenton Grills on 10/19/2022 10:22:07 -------------------------------------------------------------------------------- Lower Extremity Assessment Details Patient Name: Date of Service: [name], MA Carmen F. 10/19/2022 9:30 A M Medical Record Number: [id number] Patient Account Number: [id number] Date of Birth/Sex: Treating RN: 27-Sep-1950 (72 y.o. Gevena [name] Primary Care [name]: Berniece Andreas Other Clinician: Referring Vedder Brittian: Treating Secily Walthour/Extender: Tanja Port in Treatment: 1 Electronic Signature(s) Signed: 11/23/2022 7:49:05 AM By: Brenton Grills Entered By: Brenton Grills on 10/19/2022 09:36:59 -------------------------------------------------------------------------------- Multi Wound Chart Details Patient Name: Date of Service: [name], MA Carmen F. 10/19/2022 9:30 A M Medical Record Number: [id number] Patient Account Number: [id number] Date of Birth/Sex: Treating RN: Apr 03, 1951 (72 y.o. F) Primary Care Sander Remedios: Berniece Andreas Other Clinician: Referring Samanda Buske: Treating Yuki Purves/Extender: Tanja Port in Treatment: 1 Vital Signs Height(in): 64 Pulse(bpm): 87 Weight(lbs): 115 Blood Pressure(mmHg): 106/57 Body Mass Index(BMI): 19.7 Temperature(F): 98.2 Respiratory  Rate(breaths/min): 18 [1:Photos:] [N/A:N/A] Left Gluteus N/A N/A Wound Location: Pressure Injury N/A N/A Wounding Event: Pressure Ulcer N/A N/A Primary Etiology: Cataracts, Lymphedema, N/A N/A Comorbid History: Hypotension, Quadriplegia 07/15/2022 N/A N/A Date Acquired: 1 N/A N/A Weeks of Treatment: Open N/A N/A Wound Status: No N/A N/A Wound Recurrence: 0.6x0.7x0.7 N/A N/A Measurements L x W x D (cm) 0.33 N/A N/A A (cm) : rea 0.231 N/A N/A Volume (cm) : 12.50% N/A N/A % Reduction in A rea: 52.90%  N/A N/A % Reduction in Volume: 12 Starting Position 1 (o'clock): 5 Ending Position 1 (o'clock): 1.8 Maximum Distance 1 (cm): Yes N/A N/A Undermining: Category/Stage III N/A N/A Classification: Medium N/A N/A Exudate A mount: Serosanguineous N/A N/A Exudate Type: red, brown N/A N/A Exudate Color: Distinct, outline attached N/A N/A Wound Margin: Medium (34-66%) N/A N/A Granulation A mount: Red, Pink N/A N/A Granulation Quality: Medium (34-66%) N/A N/A Necrotic A mount: Fat Layer (Subcutaneous Tissue): Yes N/A N/A Exposed Structures: Fascia: No Tendon: No Muscle: No Joint: No Bone: No Small (1-33%) N/A N/A Epithelialization: Excoriation: No N/A N/A Periwound Skin Texture: Induration: No Callus: No Crepitus: No Rash: No Scarring: No Maceration: No N/A N/A Periwound Skin Moisture: Dry/Scaly: No Atrophie Blanche: No N/A N/A Periwound Skin Color: Cyanosis: No Ecchymosis: No Erythema: No Hemosiderin Staining: No Mottled: No Pallor: No Rubor: No Treatment Notes Wound #1 (Gluteus) Wound Laterality: Left Cleanser Vashe 5.8 (oz) Discharge Instruction: Cleanse the wound with Vashe prior to applying a clean dressing using gauze sponges, not tissue or cotton balls. Peri-Wound Care Skin Prep Discharge Instruction: Use skin prep as directed Topical Primary Dressing Hydrofera Blue Classic Foam Rope Dressing, 9x6 (mm/in) Discharge Instruction: Moisten  with saline prior to packing Secondary Dressing Bordered Gauze, 4x4 in Discharge Instruction: Apply over primary dressing as directed. Secured With [employer] Compression Stockings Add-Ons Electronic Signature(s) KYANDRA, LOA ([id number]) [id number] Page 5 of 8 Signed: 10/19/2022 3:53:26 PM By: Baltazar Najjar MD Entered By: Baltazar Najjar on 10/19/2022 10:31:09 -------------------------------------------------------------------------------- Multi-Disciplinary Care Plan Details Patient Name: Date of Service: [name], MA Carmen F. 10/19/2022 9:30 A M Medical Record Number: [id number] Patient Account Number: [id number] Date of Birth/Sex: Treating RN: 01-Mar-1951 (72 y.o. Gevena [name] Primary Care Sidonia Nutter: Berniece Andreas Other Clinician: Referring Atom Solivan: Treating Celestia Duva/Extender: Tanja Port in Treatment: 1 Active Inactive Nutrition Nursing Diagnoses: Potential for alteratiion in Nutrition/Potential for imbalanced nutrition Goals: Patient/caregiver agrees to and verbalizes understanding of need to obtain nutritional consultation Date Initiated: 10/11/2022 Target Resolution Date: 10/21/2022 Goal Status: Active Interventions: Provide education on elevated blood sugars and impact on wound healing Provide education on nutrition Treatment Activities: Patient referred to Primary Care Physician for further nutritional evaluation : 10/11/2022 Notes: Orientation to the Wound Care Program Nursing Diagnoses: Knowledge deficit related to the wound healing center program Goals: Patient/caregiver will verbalize understanding of the Wound Healing Center Program Date Initiated: 10/11/2022 Target Resolution Date: 10/22/2022 Goal Status: Active Interventions: Provide education on orientation to the wound center Notes: Pressure Nursing Diagnoses: Potential for impaired tissue integrity related to pressure, friction, moisture,  and shear Goals: Patient will remain free from development of additional pressure ulcers Date Initiated: 10/11/2022 Target Resolution Date: 10/22/2022 Goal Status: Active Patient/caregiver will verbalize understanding of pressure ulcer management Date Initiated: 10/11/2022 Target Resolution Date: 10/22/2022 Goal Status: Active Interventions: Assess: immobility, friction, shearing, incontinence upon admission and as needed Assess offloading mechanisms upon admission and as needed Provide education on pressure ulcers Notes: Wound/Skin Impairment Nursing Diagnoses: Knowledge deficit related to ulceration/compromised skin integrity MERISHA, HORGEN ([id number]) [phone].pdf Page 6 of 8 Goals: Patient/caregiver will verbalize understanding of skin care regimen Date Initiated: 10/11/2022 Target Resolution Date: 10/22/2022 Goal Status: Active Interventions: Assess patient/caregiver ability to perform ulcer/skin care regimen upon admission and as needed Assess ulceration(s) every visit Provide education on ulcer and skin care Treatment Activities: Skin care regimen initiated : 10/11/2022 Topical wound management initiated : 10/11/2022 Notes: Electronic Signature(s) Signed: 11/23/2022 7:49:05 AM By: Brenton Grills Entered  By: Brenton Grills on 10/19/2022 09:41:44 -------------------------------------------------------------------------------- Pain Assessment Details Patient Name: Date of Service: [name], Carmen Carmen F. 10/19/2022 9:30 A M Medical Record Number: [id number] Patient Account Number: [id number] Date of Birth/Sex: Treating RN: 03/09/1951 (72 y.o. Gevena [name] Primary Care Jensine Luz: Berniece Andreas Other Clinician: Referring Offie Pickron: Treating Athanasius Kesling/Extender: Tanja Port in Treatment: 1 Active Problems Location of Pain Severity and Description of Pain Patient Has Paino No Site Locations Pain Management and  Medication Current Pain Management: Electronic Signature(s) Signed: 11/23/2022 7:49:05 AM By: Brenton Grills Entered By: Brenton Grills on 10/19/2022 09:36:51 -------------------------------------------------------------------------------- Patient/Caregiver Education Details Patient Name: Date of Service: [name], MA [name] 5/7/2024andnbsp9:30 A M Medical Record Number: [id number] Patient Account Number: [id number] Date of Birth/Gender: Treating RN: 18-Feb-1951 (72 y.o. Gevena [name] Primary Care Physician: Berniece Andreas Other Clinician: Referring Physician: Treating Physician/Extender: Ahmiracle, [place], [name] ([id number]) [id number] Page 7 of 8 Weeks in Treatment: 1 Education Assessment Education Provided To: Patient and Caregiver Education Topics Provided Wound/Skin Impairment: Methods: Explain/Verbal Responses: State content correctly Electronic Signature(s) Signed: 11/23/2022 7:49:05 AM By: Brenton Grills Entered By: Brenton Grills on 10/19/2022 09:42:12 -------------------------------------------------------------------------------- Wound Assessment Details Patient Name: Date of Service: [name], MA Carmen F. 10/19/2022 9:30 A M Medical Record Number: [id number] Patient Account Number: [id number] Date of Birth/Sex: Treating RN: October 01, 1950 (72 y.o. Gevena [name] Primary Care Zachery Niswander: Berniece Andreas Other Clinician: Referring Masaki Rothbauer: Treating Toriann Spadoni/Extender: Tanja Port in Treatment: 1 Wound Status Wound Number: 1 Primary Etiology: Pressure Ulcer Wound Location: Left Gluteus Wound Status: Open Wounding Event: Pressure Injury Comorbid History: Cataracts, Lymphedema, Hypotension, Quadriplegia Date Acquired: 07/15/2022 Weeks Of Treatment: 1 Clustered Wound: No Photos Wound Measurements Length: (cm) 0.6 Width: (cm) 0.7 Depth: (cm) 0.7 Area: (cm) 0.33 Volume: (cm) 0.231 % Reduction  in Area: 12.5% % Reduction in Volume: 52.9% Epithelialization: Small (1-33%) Tunneling: No Undermining: Yes Starting Position (o'clock): 12 Ending Position (o'clock): 5 Maximum Distance: (cm) 1.8 Wound Description Classification: Category/Stage III Wound Margin: Distinct, outline attached Exudate Amount: Medium Exudate Type: Serosanguineous Exudate Color: red, brown Foul Odor After Cleansing: No Slough/Fibrino Yes Wound Bed Granulation Amount: Medium (34-66%) Exposed JULYE, GORRA ([id number]) [id number] Page 8 of 8 Granulation Quality: Red, Pink Fascia Exposed: No Necrotic Amount: Medium (34-66%) Fat Layer (Subcutaneous Tissue) Exposed: Yes Necrotic Quality: Adherent Slough Tendon Exposed: No Muscle Exposed: No Joint Exposed: No Bone Exposed: No Periwound Skin Texture Texture Color No Abnormalities Noted: No No Abnormalities Noted: No Callus: No Atrophie Blanche: No Crepitus: No Cyanosis: No Excoriation: No Ecchymosis: No Induration: No Erythema: No Rash: No Hemosiderin Staining: No Scarring: No Mottled: No Pallor: No Moisture Rubor: No No Abnormalities Noted: No Dry / Scaly: No Maceration: No Electronic Signature(s) Signed: 11/23/2022 7:49:05 AM By: Brenton Grills Entered By: Brenton Grills on 10/19/2022 10:01:28 -------------------------------------------------------------------------------- Vitals Details Patient Name: Date of Service: [name], MA Carmen F. 10/19/2022 9:30 A M Medical Record Number: [id number] Patient Account Number: [id number] Date of Birth/Sex: Treating RN: 1950-12-15 (72 y.o. Gevena [name] Primary Care Shavone Nevers: Berniece Andreas Other Clinician: Referring Helios Kohlmann: Treating Emmerson Taddei/Extender: Tanja Port in Treatment: 1 Vital Signs Time Taken: 09:34 Temperature (F): 98.2 Height (in): 64 Pulse (bpm): 87 Weight (lbs): 115 Respiratory Rate (breaths/min): 18 Body  Mass Index (BMI): 19.7 Blood Pressure (mmHg): 106/57 Reference Range: 80 - 120 mg / dl Electronic Signature(s) Signed: 11/23/2022 7:49:05 AM By: Brenton Grills Entered By: Brenton Grills on  10/19/2022 09:36:44 
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
## 2022-11-25 ENCOUNTER — Ambulatory Visit: Payer: Medicare PPO | Admitting: Occupational Therapy

## 2022-11-25 ENCOUNTER — Ambulatory Visit: Payer: Medicare PPO | Admitting: Physical Therapy

## 2022-11-25 DIAGNOSIS — M6281 Muscle weakness (generalized): Secondary | ICD-10-CM

## 2022-11-25 DIAGNOSIS — R293 Abnormal posture: Secondary | ICD-10-CM | POA: Diagnosis not present

## 2022-11-25 DIAGNOSIS — R29818 Other symptoms and signs involving the nervous system: Secondary | ICD-10-CM | POA: Diagnosis not present

## 2022-11-25 DIAGNOSIS — R29898 Other symptoms and signs involving the musculoskeletal system: Secondary | ICD-10-CM

## 2022-11-25 DIAGNOSIS — R278 Other lack of coordination: Secondary | ICD-10-CM | POA: Diagnosis not present

## 2022-11-25 DIAGNOSIS — G8254 Quadriplegia, C5-C7 incomplete: Secondary | ICD-10-CM

## 2022-11-25 DIAGNOSIS — R2689 Other abnormalities of gait and mobility: Secondary | ICD-10-CM

## 2022-11-25 DIAGNOSIS — M24541 Contracture, right hand: Secondary | ICD-10-CM

## 2022-11-25 DIAGNOSIS — M24542 Contracture, left hand: Secondary | ICD-10-CM | POA: Diagnosis not present

## 2022-11-25 DIAGNOSIS — G8253 Quadriplegia, C5-C7 complete: Secondary | ICD-10-CM | POA: Diagnosis not present

## 2022-11-25 NOTE — Therapy (Signed)
OUTPATIENT OCCUPATIONAL THERAPY NEURO TREATMENT  Patient Name: [name] MRN: [id number] DOB:[birth year], 72 y.o., female Today's Date: 11/25/2022  PCP: Carmen Headings, MD  REFERRING PROVIDER: [name], MD  END OF SESSION:  OT End of Session - 11/25/22 1441     Visit Number 13    Number of Visits 17    Authorization Type [employer] - requires auth, [place]    Authorization Time Period 10/12/22 - 12/10/22    Authorization - Visit Number 13    Authorization - Number of Visits 17    Progress Note Due on Visit 10    OT Start Time 0933    OT Stop Time 1014    OT Time Calculation (min) 41 min    Equipment Utilized During Treatment Splint strapping material    Activity Tolerance Patient tolerated treatment well    Behavior During Therapy WFL for tasks assessed/performed               Past Medical History:  Diagnosis Date   CERVICAL POLYP 03/11/2008   Qualifier: Diagnosis of  By: Carmen Sharp MD, [name]    Colon polyps 2005   on colonscopy Dr. Russella [name]   Fibroid 2004   Per Dr. Dareen [name]   History of shingles    face and mouth   Hx of skin cancer, basal cell    Rosacea    Sciatica of left side 09/28/2013   Scoliosis    noted on mri done for back pain   Past Surgical History:  Procedure Laterality Date   BUNIONECTOMY     Patient Active Problem List   Diagnosis Date Noted   Buttock wound, left, initial encounter 11/02/2022   Orthostatic hypotension 08/13/2022   Neurogenic bowel 05/03/2022   Spasticity 05/03/2022   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/03/2022   Nerve pain 05/03/2022   Medication monitoring encounter 01/08/2022   Neurogenic bladder 10/11/2021   Urinary incontinence 10/11/2021   ESBL (extended spectrum beta-lactamase) producing bacteria infection 10/09/2021   Recurrent UTI 10/09/2021   Quadriplegia, C5-C7 incomplete (HCC) 01/16/2021   History of spinal fracture 01/16/2021   Suprapubic catheter (HCC) 01/16/2021   Encounter for routine gynecological  examination 09/28/2013   Onychomycosis 09/28/2013   Foot deformity, acquired 03/26/2012   Encounter for preventive health examination 12/25/2010   ROSACEA 08/25/2009   Disturbance in sleep behavior 03/11/2008   SKIN CANCER, HX OF 03/11/2008   DYSURIA, HX OF 03/11/2008   Hyperlipidemia 02/10/2007   CERVICALGIA 02/10/2007    ONSET DATE: 07/28/2020  Date of Referral 09/28/22  REFERRING DIAG: G82.54 (ICD-10-CM) - Quadriplegia, C5-C7, incomplete (HCC)  THERAPY DIAG:  Muscle weakness (generalized)  Other lack of coordination  Contracture of hand joint, left  Contracture of hand joint, right  Other symptoms and signs involving the musculoskeletal system  Rationale for Evaluation and Treatment: Rehabilitation  SUBJECTIVE:   SUBJECTIVE STATEMENT:   Pt reports she would like to learn more applications for NMES home use.   Pt accompanied by:  Live in Caregiver - [name]   PERTINENT HISTORY: "Pt is a 72 yr old L handed female with hx of incomplete quadriplegia- 2/14 2022- fleeing the police in [name] on passenger 100 (high speed) miles/hour,  [name]; neurogenic bowel and bladder and spasticity; [name], [name] Here for f/u on Incomplete quadriplegia"  Referral from MD 09/28/22 states, "Please eval and treat for ADLs and higher level mobility."  PRECAUTIONS: Fall; suprapubic catheter (she wants to get this removed meaning she  needs to get to and from the toilet); she has had minor heat sensation when needing to complete her bowel program-possible AD?   WEIGHT BEARING RESTRICTIONS: No-pt was in stander at most recent therapy in [place] last week.  She will have a bone density done soon w/ Dr. Fabian [name].   PAIN:  Patient reports chronic pain 3/10 fingers to elbow bilaterally managed by medication and will inform therapy staff of any changes.  FALLS: Has patient fallen in last 6 months? No  LIVING ENVIRONMENT: Lives with: lives with their family - husband [name] and  with an adult companion s/p moving back up from [place] x10 months Lives in: House/apartment Stairs:  4 story town house with an Engineer, structural with threshold adjustments, roll in shower with transport chair Has following equipment at home: Wheelchair (power) - with seat height adjustments to access counters and reclining option, Wheelchair (manual), transport WC, shower chair, and Ramped entry, handheld showerhead with rails around toilet, had Michiel Sites but is no longer in need of it, has slide boards x3  PLOF: Requires assistive device for independence, Needs assistance with ADLs, Needs assistance with homemaking, Needs assistance with gait, and Needs assistance with transfers; 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  Used to like to knit, sew and bake.  PATIENT GOALS: Wants to be able to type - currently using advanced Dragon dictation at times but prefers to type at times.  She would like to be able to write better and is interested in resuming some leisure activities such as Archivist and baking.  She also wants to keep working on being able to cut her own food and on her UE strength.   OBJECTIVE:   HAND DOMINANCE: Left  ADLs: Overall ADLs: Patient has a live in caregiver  Transfers/ambulation related to ADLs: Mod assist with sliding board transfers (previously [employer] lift).  Eating: Has a rocker knife that she can use. Used to use adapted utensils but now uses regular utensils but still will get assistance to cut food ie) when eating out.  Grooming: can brush her own hair but unable to manage jewelry ie) earrings  UB Dressing: can zip/unzip after it has been started, unable to manage buttons herself, Caregiver assists but if she has extra time, she can put on her bra, and a loose fitting pullover shirt/t-shirt  LB Dressing: dependent for LB dressing in bed and with special sock donner for LE compression garments   Toileting: bladder trained with suprapubic catheter which she clamps  off.  Dependent for bowel incontinence care.  Bathing: Sponge bath with adult washclothes.  Can bath UB with back scrubber for most of her back.  Needs help with feet (mentioned she might need a separate brush for feet)   Tub Shower transfers: Min-mod assist with slide board  Equipment: Shower seat with back, Walk in shower, bed side commode, Reacher, Sock aid, Long handled sponge, and Feeding equipment  IADLs: --  Shopping: Assisted by caregiver  Light housekeeping: Has housekeeper that comes monthly  Meal Prep: previously enjoyed baking. Assisted by caregiver but described recent success at reheating a meal for herself after getting food out of the fridge/freezer from her WC.  Community mobility: Dependent  Medication management: Caregiver sorts them into pillbox but she is very aware of her medications   Financial management: Patient manages her own finances  Handwriting: Increased time and has a pen with a little grip  MOBILITY STATUS: Independent with power mobiity  POSTURE COMMENTS:  No  Significant postural limitations and forward head Sitting balance: Supports self independently with both Ues  ACTIVITY TOLERANCE: Activity tolerance: Fair - MMT WFL but has limited sustained tolerance for ongoing use of UEs  FUNCTIONAL OUTCOME MEASURES: 10/20/22 QuickDash 31.8 points   UPPER EXTREMITY ROM:   AROM - WFL without obvious contractures, some digital flexion noted but PROM WNL   AROM Right (eval) Left (eval)  Shoulder flexion [facility]  Shoulder abduction [facility]  Elbow flexion [facility] WFL  Elbow extension [facility]  Wrist flexion [facility] WFL  Wrist extension WFL WFL  Ulnar deviation WFL Decreased ulnar  deviation past midline  Wrist pronation [facility] WFL  Wrist supination [facility]  Digit Composite Flexion Lacks full AROM:   1st digit - 5 cm   3rd digit -1 cm   4th digit - 2 cm  Lacks full AROM:   1st digit - 1.5 cm  5th digit - 3 cm   Digit Composite Extension [facility]   Digit Opposition Opposition to index finger only Lacks to pinkie due to limited DIP pinkie flexion  (Blank rows = not tested)  UPPER EXTREMITY MMT:   Grossly WFL - Endurance limited R tricep strength > than L but L UE generally stronger than R UE  MMT Right (eval) Left (eval)  Shoulder flexion 4/5 4/5  Shoulder abduction 4/5 4/5  Elbow flexion 4/5 4/5  Elbow extension 4/5 4-/5  (Blank rows = not tested)  HAND FUNCTION: Grip strength: Right: 4.8 lbs; Left: 20.9 lbs 11/17/22 Right - 7.2 lbs  COORDINATION: Finger Nose Finger test: R generally WFL, L WNL Box and Blocks:  Right 28 blocks, Left 37 blocks R hand finger eventually cramps and dexterity worsens in the cold  SENSATION: Light touch: Impaired  - patient   EDEMA: NA for UEs but LE has poor lymph drainage with custom compression garments   MUSCLE TONE: Generally WFL but will assess further  COGNITION: Overall cognitive status: Within functional limits for tasks assessed  VISION: Subjective report: Patent wears progressive lens/glasses.  Denies diplopia or vision changes but has eye exam in the next couple of months. Baseline vision: Wears glasses all the time  VISION ASSESSMENT: WFL  EVALUATION OBSERVATIONS: Patient independent with power WC navigation within clinic.  Patient is well-kept with foley catheter in place.  She has slight limitations in full extension of digits but PROM is WNL and she has splints at home that she said she will bring for OT staff to assess.  She has functional ROM of B UEs to reach her head, behind her back and to cross midline to assist with ADLs.     TODAY'S TREATMENT:                                                                                                                                 - Neuro re-education completed for duration as noted below including: OT educated patient on  use of NMES unit as noted in patient instructions. Therapist educated patient on application for R wrist  and digit extension, digit flexion, thumb abduction, and opposition. Patient able to teach back information provided and tolerated supervised application to promote improved AROM and pain management. Therapist assisting patient with understanding electrode placement on arm to help with carryover outside of therapy.  Modalities Skin Integrity Assessment:  NMES utilized: No redness, irritation or skin integrity concerns were noted before, during and/or after use. Patient was informed of risks and benefits to treatment today. Skin integrity prior to treatment: Intact. Skin integrity after treatment: Intact.  PATIENT EDUCATION: Education details: NMES use person educated: Patient and Software engineer in caregiver - [name] Education method: Explanation, Demonstration, Tactile cues, and Verbal cues,  Education comprehension: verbalized understanding, returned demonstration, and needs further education Patient desires specific list of exercises by groupings.  HOME EXERCISE PROGRAM: [email or web address] 10/27/22 - Theraputty  Access Code: [email or web address]  11/11/22 - Elbow Extension Access Code: [email or web address]  11/18/22 Wheelchair Push up Access Code: [email or web address] 11/25/2022: NMES use  GOALS:   SHORT TERM GOALS: Target date: 11/09/22  Patient will be able to use AE/modified techniques to cut soft foods small enough for oral intake. Baseline: Caregiver/spouse assist Goal status: IN PROGRESS  2.  Patient will be able to use AE/modified positioning to complete word search by drawing lines through words with 0 errors. Baseline: TBD Goal status: NOT MET - Goal discontinued as patient focusing on writing more than word search ie) note taking etc.  3.  Patient will verbalize understanding of good pressure relief schedule (for 15 to 60 seconds every 15 to 60 minutes) to help with wound healing. Baseline: Patient did not change positioning in > 45 minutes of OT eval. Goal status: MET  4.  Patient will be  assisted to explore modifications for leisure tasks (knit/sew/bake) with good return demonstration. Baseline: Minimal involvement Goal status: IN PROGRESS  11/17/22 - trialled knitting   5.  Patient will demonstrate independence with HEP for UE strengthening, coordination and ROM to prevent contractures and maintain strength for transfers and ADLs. Baseline: Previous HEPs have been established but need to be reviewed and updated.  Goal status: IN PROGRESS  11/17/22 working on lists and categorizing activities  6.  Patient will be assessed for typing speed/dexterity. Baseline: Patient reports difficulty with typing with all her fingers. Goal status: IN PROGRESS  7. Patient will complete Quick Dash UE assessment. Baseline: TBD Goal Status: 10/20/22 Discontinued - see scores above   LONG TERM GOALS: Target date: 12/10/22  Patient will complete 1 small craft/week r/t her leisure interests to work on FMS daily. Baseline: Minimal involvement Goal status: IN PROGRESS  2.  Patient will improve B coordination for increased typing speed/dexterity x 1-2 WPM. Baseline: TBD Goal status: IN PROGRESS  11/17/22 daily engagement with typing at home although modified use of digits for speed  3.  Patient will improve B UE coordination, strength and functional use to bake cookies with setup assistance and AE/modified techniques as appropriate.  Baseline: Not performed. Goal status: IN PROGRESS  4.  Patient will report no more than moderate difficulty using a knife to foods such as chicken, small enough for oral intake using AE and strategies as needed. Baseline: Caregiver/spouse assist Goal status: IN PROGRESS  5.  Patient will be able to use AE/modified positioning to complete 4 sentences with 100% legibility. Baseline: Subjective reports of difficulty with writing Goal status: IN PROGRESS   ASSESSMENT:  CLINICAL IMPRESSION:  Patient demonstrated good understanding and response to NMES use as  needed to promote improved ROM at home.    PERFORMANCE DEFICITS: in functional skills including ADLs, IADLs, coordination, dexterity, strength, muscle spasms, Fine motor control, Gross motor control, continence, skin integrity, and UE functional use,   IMPAIRMENTS: are limiting patient from ADLs, IADLs, work, and leisure.   CO-MORBIDITIES: has co-morbidities such as incontinence and wound  that affects occupational performance. Patient will benefit from skilled OT to address above impairments and improve overall function.  REHAB POTENTIAL: Fair due to chronicity of injury   PLAN:  OT FREQUENCY: 2x/week  OT DURATION: 8 weeks  PLANNED INTERVENTIONS: self care/ADL training, therapeutic exercise, therapeutic activity, neuromuscular re-education, manual therapy, passive range of motion, balance training, functional mobility training, splinting, patient/family education, energy conservation, coping strategies training, and DME and/or AE instructions  RECOMMENDED OTHER SERVICES: Patient was seen for PT evaluation today with treatment plans coordinated for 2x/week.  CONSULTED AND AGREED WITH PLAN OF CARE: Patient and family member/caregiver  PLAN FOR NEXT SESSION: Review NMES (reciprical?)  Review positioning of R hand to help with finger opposition and isolation of finger joints.  Continue to update BUE HEP (by category) including administration of HEP calendar. May use (.pdexchart) for template.  Also -- combine all MedBridge exercises to 1 program.  Continue FM tasks to improve typing shoes and progressing simple hobby/craft activities (ie baking, yarn activities & baking).   Delana Meyer, OT 11/25/2022, 2:43 PM

## 2022-11-25 NOTE — Therapy (Signed)
OUTPATIENT PHYSICAL THERAPY NEURO TREATMENT   Patient Name: [name] MRN: [id number] DOB:[birth year], 72 y.o., female Today's Date: 11/25/2022   PCP: [name], MD REFERRING PROVIDER: [name], MD   END OF SESSION:  PT End of Session - 11/25/22 1041     Visit Number 13    Number of Visits 17   16 + eval   Date for PT Re-Evaluation 12/10/22    Authorization Type [employer]ICARE    Progress Note Due on Visit 20    PT Start Time 1017   from OT session   PT Stop Time 1102    PT Time Calculation (min) 45 min    Equipment Utilized During Treatment Gait belt    Activity Tolerance Patient tolerated treatment well    Behavior During Therapy [facility] for tasks assessed/performed               Past Medical History:  Diagnosis Date   CERVICAL POLYP 03/11/2008   Qualifier: Diagnosis of  By: [name] MD, [name]    Colon polyps 2005   on colonscopy [name]   Fibroid 2004   Per Dr. [name]   History of shingles    face and mouth   Hx of skin cancer, basal cell    Rosacea    Sciatica of left side 09/28/2013   Scoliosis    noted on mri done for back pain   Past Surgical History:  Procedure Laterality Date   BUNIONECTOMY     Patient Active Problem List   Diagnosis Date Noted   Buttock wound, left, initial encounter 11/02/2022   Orthostatic hypotension 08/13/2022   Neurogenic bowel 05/03/2022   Spasticity 05/03/2022   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/03/2022   Nerve pain 05/03/2022   Medication monitoring encounter 01/08/2022   Neurogenic bladder 10/11/2021   Urinary incontinence 10/11/2021   ESBL (extended spectrum beta-lactamase) producing bacteria infection 10/09/2021   Recurrent UTI 10/09/2021   Quadriplegia, C5-C7 incomplete (HCC) 01/16/2021   History of spinal fracture 01/16/2021   Suprapubic catheter (HCC) 01/16/2021   Encounter for routine gynecological examination 09/28/2013   Onychomycosis 09/28/2013   Foot deformity, acquired 03/26/2012    Encounter for preventive health examination 12/25/2010   ROSACEA 08/25/2009   Disturbance in sleep behavior 03/11/2008   SKIN CANCER, HX OF 03/11/2008   DYSURIA, HX OF 03/11/2008   Hyperlipidemia 02/10/2007   CERVICALGIA 02/10/2007    ONSET DATE: 09/28/2022 (most recent referral)  REFERRING DIAG: G82.54 (ICD-10-CM) - Quadriplegia, C5-C7, incomplete (HCC)  THERAPY DIAG:  Muscle weakness (generalized)  Abnormal posture  Other abnormalities of gait and mobility  Quadriplegia, C5-C7 incomplete (HCC)  Other symptoms and signs involving the nervous system  Rationale for Evaluation and Treatment: Rehabilitation  SUBJECTIVE:  SUBJECTIVE STATEMENT: Pt received seated in her PWC from OT session. Pt wearing her wound vac again and [name] reports that her wound is healing well.  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 Yes: NPRS scale: 3/10 Pain location: forearms to fingertips Pain description: constant, pinprick/tingling Aggravating factors: nighttime Relieving factors: nothing, sometimes medicines  PRECAUTIONS: Fall; suprapubic catheter (she wants to get this removed meaning she needs to get to and from the toilet); she has had minor heat sensation when needing to complete her bowel program-possible AD?  WEIGHT BEARING RESTRICTIONS: No-pt was in stander at most recent therapy in [place] last week.  She will have a bone density done soon w/ Dr. [name].  FALLS: Has patient fallen in last 6 months? No  LIVING ENVIRONMENT: Lives with: lives with their spouse and live-in nurse [name] Lives in: House/apartment-townhouse Stairs: No-level entry, but multi-level home 4  stories w/ elevator Has following equipment at home: Wheelchair (power), Wheelchair (manual), Grab bars, and standing frame, sliding board, transport shower chair, handheld shower head, leg strap-pt reports she no longer finds this helpful  PLOF: Requires assistive device for independence, Needs assistance with ADLs, Needs assistance with homemaking, and Needs assistance with transfers  OCCUPATION:  Writer-uses Dragon to dictate  PATIENT GOALS: "Make my right leg work."  Stand and pivot so she can more easily access a toilet.  OBJECTIVE:   DIAGNOSTIC FINDINGS: No recent relevant imaging.  Bone density scheduled 10/15/2022.  COGNITION: Overall cognitive status: Within functional limits for tasks assessed   SENSATION: Light touch: Diminished ability to distinguish sharp and dull, but able to distinguish light touch from injury level down accurately  COORDINATION: Not formally assessed.  EDEMA:  Well managed w/ lymphatic massage and compression stockings.  MUSCLE TONE: Pt has intermittent clonus during transfers.  POSTURE: rounded shoulders, posterior pelvic tilt, and right thoracic scoliosis   LOWER EXTREMITY ROM:     Passive  Right 10/20/22 Left 10/20/22  Hip flexion [facility]  Hip extension    Hip abduction    Hip adduction    Hip internal rotation [facility] WFL  Hip external rotation [facility]  Knee flexion [facility] WFL  Knee extension [facility] WFL  Ankle dorsiflexion Slight PF contracture Slight PF contracture  Ankle plantarflexion    Ankle inversion    Ankle eversion     (Blank rows = not tested)    LOWER EXTREMITY MMT:    MMT Right 10/20/22 Left 10/20/22  Hip flexion 1 2+  Hip extension    Hip abduction    Hip adduction    Hip internal rotation    Hip external rotation    Knee flexion 2- 3  Knee extension 2- 3  Ankle dorsiflexion 0 3  Ankle plantarflexion    Ankle inversion    Ankle eversion     (Blank rows = not tested)   BED MOBILITY:  Sit to supine Mod A Supine to  sit Mod A Rolling to Right Mod A Rolling to Left Mod A Undulating mattress for wound management on standard bed (elevated-so often doing uphill sliding board transfers); she would like to continue working on sitting up independently, she has been working on rolling, needs less assistance w/ this when someone props her leg into hooklying; would like something to help her pull her left leg to her butt for stretching as well  as bed mobility.  FUNCTIONAL TESTS:  None relevant to pt's current functional level and abilities.  PATIENT SURVEYS:  None completed due to time.  TODAY'S TREATMENT:        TherAct/NMR: Pt received seated in PWC. Pop-over transfer w/c to mat table with min A needed for some LE repositioning. Session focus on use of NMES to B quads while in standing frame for functional stimulation of muscles. BP not assessed during session but pt remains asymptomatic with standing this session. Performed NMES level 50 to B quads via use of pt's NMES/TENs machine during session. Inspected skin after electrode removal with mild redness noted, encouraged pt and [name] to monitor skin for changes. Pt able to tolerate NMES 2 x 10 min to B quads in standing, also tolerates standing x 10 min. During standing decreased assist from standing frame sling and then had pt focus on glute and quad activation with assist from NMES to quads to go from semi-squat position to full upright position. Pt requires min A to achieve upright posture without assist from sling. Utilized standing frame to transfer patient back to PWC at end of session. Pt left seated in PWC with [name] at end of session.   PATIENT EDUCATION: Education details:  NMES vs TENS and plans for NMES in future sessions to glutes pending wound healing.  Safety and skin check. Person educated: Patient and Arts administrator Education method: Explanation Education comprehension: verbalized understanding  HOME EXERCISE PROGRAM: Will be established as  needed as pt has done continuous therapy and is working towards functional tasks.  GOALS: Goals reviewed with patient? Yes  SHORT TERM GOALS: Target date: 11/12/2022  HEP to be established for stretching and strengthening as needed. Baseline:  Pt has established home routine and adequate caregiver assistance at this time (5/30)  Goal status: IN PROGRESS  2.  Pt will be able to perform rolling L and R w/ no more than minA in order to improve functional mobility. Baseline: modA, min A to setup LE into hookyling (5/28) Goal status: MET  3.  Pt will perform sit<>supine requiring no more than minA in order to improve functional mobility. Baseline: modA, mod A (5/28) Goal status: IN PROGRESS  4.  Pt will perform bilateral reach outside seated BOS with feet supported without LOB in order to improve safety with ADL performance. Baseline: lateral LOB bilaterally demonstrated in w/c; pt able to reach 2-3 inches outside BOS w/ moderate wobble (R worse than L - 5/30) Goal status: MET  LONG TERM GOALS: Target date: 12/10/2022  Pt will perform squat pivot transfer w/ no more than modA in order to improve access to home environment for toilet transfers. Baseline: minA for lateral scoot transfer, pt able to clear bottom intermittently Goal status: INITIAL  2.  Pt will perform sit<>supine requiring no more than supervision in order to improve transfers in and out of bed. Baseline:  modA Goal status: INITIAL  3.  Pt will report wound healing in order to return to day program at [facility]. Baseline:  Ischial wound Goal status: INITIAL  4.  Pt will be able to perform rolling L and R w/ no more than supervision in order to improve functional mobility. Baseline: modA Goal status: INITIAL  5.  Pt will perform uphill sliding board transfer w/ no more than modA in order to improve transfer in home environment. Baseline:  To be assessed. Goal status: INITIAL  6.  Pt will perform partial STS in  stander or  from elevated power w/c to counter w/ no more than modA in order to improve capacity for higher level transfers and access to uneven surfaces. Baseline: To be assessed. Goal status: INITIAL  ASSESSMENT:  CLINICAL IMPRESSION: Emphasis of skilled PT session on performing NMES to B quads in conjunction with standing in standing frame for functional estim of musculature. Pt exhibits good contraction of her quads with use of NMES this session, worked on mini sit to stands during on period of NMES to increase motor recruitment. Pt exhibits good tolerance for standing this date with no signs/symptoms of OH. Pt continues to benefit from skilled therapy services to work towards her LTGs. Continue POC.   OBJECTIVE IMPAIRMENTS: decreased balance, decreased coordination, decreased mobility, difficulty walking, decreased ROM, decreased strength, hypomobility, increased edema, impaired flexibility, impaired sensation, impaired tone, impaired UE functional use, improper body mechanics, postural dysfunction, and pain.   ACTIVITY LIMITATIONS: carrying, lifting, bending, sitting, standing, squatting, stairs, transfers, bed mobility, continence, bathing, toileting, dressing, reach over head, hygiene/grooming, locomotion level, and caring for others  PARTICIPATION LIMITATIONS: meal prep, cleaning, laundry, driving, and community activity  PERSONAL FACTORS: Age, Fitness, Past/current experiences, Time since onset of injury/illness/exacerbation, and 1-2 comorbidities: intermittent AD, neurogenic bowel/bladder on active bladder training  are also affecting patient's functional outcome.   REHAB POTENTIAL: Good  CLINICAL DECISION MAKING: Evolving/moderate complexity  EVALUATION COMPLEXITY: Moderate  PLAN:  PT FREQUENCY: 2x/week  PT DURATION: 8 weeks  PLANNED INTERVENTIONS: Therapeutic exercises, Therapeutic activity, Neuromuscular re-education, Balance training, Patient/Family education, Self Care,  DME instructions, Electrical stimulation, Wheelchair mobility training, Manual therapy, and Re-evaluation  PLAN FOR NEXT SESSION:  TENS or NMES to LE, leg puller for bed mobility, Further assess and address multilevel slide board transfers.  Education on pressure relief for wound healing.  Core strength, static sitting balance-perturbations.  Bed mobility.  Use stander to practice weight-bearing and core as tolerated--monitor BP due to history of OH, can work towards decreased sling support in standing, use of powderboard on mat table for RLE strengthening, wants to work on increasing independence with lateral scoot transfers without SB and also wants to be able to place SB independently and move her LE more independently; use of leg loops/lifters to increase independence with LE management with transfers and bed mobility, pt asking about scheduling more appointments  Peter Congo, PT, DPT, CSRS 11/25/2022, 11:03 AM

## 2022-11-26 ENCOUNTER — Other Ambulatory Visit: Payer: Self-pay | Admitting: Family

## 2022-11-26 ENCOUNTER — Encounter: Payer: Self-pay | Admitting: Internal Medicine

## 2022-11-26 MED ORDER — NORTRIPTYLINE HCL 50 MG PO CAPS: 50.0000 mg | ORAL_CAPSULE | Freq: Every day | ORAL | 1 refills | Status: DC

## 2022-11-29 ENCOUNTER — Ambulatory Visit: Payer: Medicare PPO | Admitting: Physical Therapy

## 2022-11-29 ENCOUNTER — Ambulatory Visit: Payer: Medicare PPO | Admitting: Occupational Therapy

## 2022-11-29 ENCOUNTER — Encounter (HOSPITAL_BASED_OUTPATIENT_CLINIC_OR_DEPARTMENT_OTHER): Payer: Medicare PPO | Admitting: Internal Medicine

## 2022-11-29 DIAGNOSIS — R208 Other disturbances of skin sensation: Secondary | ICD-10-CM

## 2022-11-29 DIAGNOSIS — R2689 Other abnormalities of gait and mobility: Secondary | ICD-10-CM

## 2022-11-29 DIAGNOSIS — R29818 Other symptoms and signs involving the nervous system: Secondary | ICD-10-CM

## 2022-11-29 DIAGNOSIS — G8254 Quadriplegia, C5-C7 incomplete: Secondary | ICD-10-CM

## 2022-11-29 DIAGNOSIS — M6281 Muscle weakness (generalized): Secondary | ICD-10-CM | POA: Diagnosis not present

## 2022-11-29 DIAGNOSIS — R293 Abnormal posture: Secondary | ICD-10-CM

## 2022-11-29 DIAGNOSIS — M24542 Contracture, left hand: Secondary | ICD-10-CM | POA: Diagnosis not present

## 2022-11-29 DIAGNOSIS — R278 Other lack of coordination: Secondary | ICD-10-CM

## 2022-11-29 DIAGNOSIS — L89323 Pressure ulcer of left buttock, stage 3: Secondary | ICD-10-CM

## 2022-11-29 DIAGNOSIS — G8253 Quadriplegia, C5-C7 complete: Secondary | ICD-10-CM

## 2022-11-29 DIAGNOSIS — G8250 Quadriplegia, unspecified: Secondary | ICD-10-CM | POA: Diagnosis not present

## 2022-11-29 DIAGNOSIS — M24541 Contracture, right hand: Secondary | ICD-10-CM

## 2022-11-29 DIAGNOSIS — T798XXA Other early complications of trauma, initial encounter: Secondary | ICD-10-CM

## 2022-11-29 DIAGNOSIS — R29898 Other symptoms and signs involving the musculoskeletal system: Secondary | ICD-10-CM

## 2022-11-29 NOTE — Therapy (Signed)
OUTPATIENT OCCUPATIONAL THERAPY NEURO TREATMENT  Patient Name: [name] MRN: [id number] DOB:11/09/1950, 72 y.o., female Today's Date: 11/29/2022  PCP: [name], MD  REFERRING PROVIDER: [name], MD  END OF SESSION:  OT End of Session - 11/29/22 1038     Visit Number 14    Number of Visits 17    Authorization Type [employer] - requires auth, [place]    Authorization Time Period 10/12/22 - 12/10/22    Authorization - Visit Number 14    Authorization - Number of Visits 17    Progress Note Due on Visit 10    OT Start Time 1024   late arrival from PT   OT Stop Time 1100    OT Time Calculation (min) 36 min    Equipment Utilized During Treatment Splint strapping material    Activity Tolerance Patient tolerated treatment well    Behavior During Therapy [facility] for tasks assessed/performed               Past Medical History:  Diagnosis Date   CERVICAL POLYP 03/11/2008   Qualifier: Diagnosis of  By: [name] MD, [name]    Colon polyps 2005   on colonscopy [name]   Fibroid 2004   Per Dr. [name]   History of shingles    face and mouth   Hx of skin cancer, basal cell    Rosacea    Sciatica of left side 09/28/2013   Scoliosis    noted on mri done for back pain   Past Surgical History:  Procedure Laterality Date   BUNIONECTOMY     Patient Active Problem List   Diagnosis Date Noted   Buttock wound, left, initial encounter 11/02/2022   Orthostatic hypotension 08/13/2022   Neurogenic bowel 05/03/2022   Spasticity 05/03/2022   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/03/2022   Nerve pain 05/03/2022   Medication monitoring encounter 01/08/2022   Neurogenic bladder 10/11/2021   Urinary incontinence 10/11/2021   ESBL (extended spectrum beta-lactamase) producing bacteria infection 10/09/2021   Recurrent UTI 10/09/2021   Quadriplegia, C5-C7 incomplete (HCC) 01/16/2021   History of spinal fracture 01/16/2021   Suprapubic catheter (HCC) 01/16/2021   Encounter for  routine gynecological examination 09/28/2013   Onychomycosis 09/28/2013   Foot deformity, acquired 03/26/2012   Encounter for preventive health examination 12/25/2010   ROSACEA 08/25/2009   Disturbance in sleep behavior 03/11/2008   SKIN CANCER, HX OF 03/11/2008   DYSURIA, HX OF 03/11/2008   Hyperlipidemia 02/10/2007   CERVICALGIA 02/10/2007    ONSET DATE: 07/28/2020  Date of Referral 09/28/22  REFERRING DIAG: G82.54 (ICD-10-CM) - Quadriplegia, C5-C7, incomplete (HCC)  THERAPY DIAG:  Muscle weakness (generalized)  Other lack of coordination  Contracture of hand joint, left  Contracture of hand joint, right  Other abnormalities of gait and mobility  Quadriplegia, C5-C7 incomplete (HCC)  Other symptoms and signs involving the nervous system  Quadriplegia, C5-C7 complete (HCC)  Other disturbances of skin sensation  Other symptoms and signs involving the musculoskeletal system  Abnormal posture  Rationale for Evaluation and Treatment: Rehabilitation  SUBJECTIVE:   SUBJECTIVE STATEMENT:   Pt questioning if she should get a new NMES with reciprocal stimulation capabilities.  Pt accompanied by:  Live in Caregiver - [name]   PERTINENT HISTORY: "Pt is a 72 yr old L handed female with hx of incomplete quadriplegia- 2/14 2022- fleeing the police in [place] on passenger 100 (high speed) miles/hour,  [name]; neurogenic bowel and bladder and spasticity; [name], has  low BP and HLD. Here for f/u on Incomplete quadriplegia"  Referral from MD 09/28/22 states, "Please eval and treat for ADLs and higher level mobility."  PRECAUTIONS: Fall; suprapubic catheter (she wants to get this removed meaning she needs to get to and from the toilet); she has had minor heat sensation when needing to complete her bowel program-possible AD?   WEIGHT BEARING RESTRICTIONS: No-pt was in stander at most recent therapy in [place] last week.  She will have a bone density done soon w/ Dr.  [name].   PAIN:  Patient reports chronic pain 3/10 fingers to elbow bilaterally managed by medication and will inform therapy staff of any changes.  FALLS: Has patient fallen in last 6 months? No  LIVING ENVIRONMENT: Lives with: lives with their family - husband [name] and with an adult companion s/p moving back up from [place] x10 months Lives in: House/apartment Stairs:  4 story town house with an Engineer, structural with threshold adjustments, roll in shower with transport chair Has following equipment at home: Wheelchair (power) - with seat height adjustments to access counters and reclining option, Wheelchair (manual), transport WC, shower chair, and Ramped entry, handheld showerhead with rails around toilet, had Michiel Sites but is no longer in need of it, has slide boards x3  PLOF: Requires assistive device for independence, Needs assistance with ADLs, Needs assistance with homemaking, Needs assistance with gait, and Needs assistance with transfers; 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  Used to like to knit, sew and bake.  PATIENT GOALS: Wants to be able to type - currently using advanced Dragon dictation at times but prefers to type at times.  She would like to be able to write better and is interested in resuming some leisure activities such as Archivist and baking.  She also wants to keep working on being able to cut her own food and on her UE strength.   OBJECTIVE:   HAND DOMINANCE: Left  ADLs: Overall ADLs: Patient has a live in caregiver  Transfers/ambulation related to ADLs: Mod assist with sliding board transfers (previously [employer] lift).  Eating: Has a rocker knife that she can use. Used to use adapted utensils but now uses regular utensils but still will get assistance to cut food ie) when eating out.  Grooming: can brush her own hair but unable to manage jewelry ie) earrings  UB Dressing: can zip/unzip after it has been started, unable to manage buttons herself, Caregiver  assists but if she has extra time, she can put on her bra, and a loose fitting pullover shirt/t-shirt  LB Dressing: dependent for LB dressing in bed and with special sock donner for LE compression garments   Toileting: bladder trained with suprapubic catheter which she clamps off.  Dependent for bowel incontinence care.  Bathing: Sponge bath with adult washclothes.  Can bath UB with back scrubber for most of her back.  Needs help with feet (mentioned she might need a separate brush for feet)   Tub Shower transfers: Min-mod assist with slide board  Equipment: Shower seat with back, Walk in shower, bed side commode, Reacher, Sock aid, Long handled sponge, and Feeding equipment  IADLs: --  Shopping: Assisted by caregiver  Light housekeeping: Has housekeeper that comes monthly  Meal Prep: previously enjoyed baking. Assisted by caregiver but described recent success at reheating a meal for herself after getting food out of the fridge/freezer from her WC.  Community mobility: Dependent  Medication management: Caregiver sorts them into pillbox but she  is very aware of her medications   Financial management: Patient manages her own finances  Handwriting: Increased time and has a pen with a little grip  MOBILITY STATUS: Independent with power mobiity  POSTURE COMMENTS:  No Significant postural limitations and forward head Sitting balance: Supports self independently with both Ues  ACTIVITY TOLERANCE: Activity tolerance: Fair - MMT WFL but has limited sustained tolerance for ongoing use of UEs  FUNCTIONAL OUTCOME MEASURES: 10/20/22 QuickDash 31.8 points   UPPER EXTREMITY ROM:   AROM - WFL without obvious contractures, some digital flexion noted but PROM WNL   AROM Right (eval) Left (eval)  Shoulder flexion [facility]  Shoulder abduction [facility]  Elbow flexion [facility] WFL  Elbow extension [facility]  Wrist flexion [facility] WFL  Wrist extension WFL WFL  Ulnar deviation WFL Decreased ulnar   deviation past midline  Wrist pronation [facility] WFL  Wrist supination [facility]  Digit Composite Flexion Lacks full AROM:   1st digit - 5 cm   3rd digit -1 cm   4th digit - 2 cm  Lacks full AROM:   1st digit - 1.5 cm  5th digit - 3 cm   Digit Composite Extension [facility]  Digit Opposition Opposition to index finger only Lacks to pinkie due to limited DIP pinkie flexion  (Blank rows = not tested)  UPPER EXTREMITY MMT:   Grossly WFL - Endurance limited R tricep strength > than L but L UE generally stronger than R UE  MMT Right (eval) Left (eval)  Shoulder flexion 4/5 4/5  Shoulder abduction 4/5 4/5  Elbow flexion 4/5 4/5  Elbow extension 4/5 4-/5  (Blank rows = not tested)  HAND FUNCTION: Grip strength: Right: 4.8 lbs; Left: 20.9 lbs 11/17/22 Right - 7.2 lbs  COORDINATION: Finger Nose Finger test: R generally WFL, L WNL Box and Blocks:  Right 28 blocks, Left 37 blocks R hand finger eventually cramps and dexterity worsens in the cold  SENSATION: Light touch: Impaired  - patient   EDEMA: NA for UEs but LE has poor lymph drainage with custom compression garments   MUSCLE TONE: Generally WFL but will assess further  COGNITION: Overall cognitive status: Within functional limits for tasks assessed  VISION: Subjective report: Patent wears progressive lens/glasses.  Denies diplopia or vision changes but has eye exam in the next couple of months. Baseline vision: Wears glasses all the time  VISION ASSESSMENT: WFL  EVALUATION OBSERVATIONS: Patient independent with power WC navigation within clinic.  Patient is well-kept with foley catheter in place.  She has slight limitations in full extension of digits but PROM is WNL and she has splints at home that she said she will bring for OT staff to assess.  She has functional ROM of B UEs to reach her head, behind her back and to cross midline to assist with ADLs.     TODAY'S TREATMENT:                                                                                                                                  -  Neuro re-education completed for duration as noted below including: OT provided additional education to patient on use of NMES unit on how to adjust on and off cycle durations. Therapist then applied and completed digit flexion and opposition with adjusted settings. Patient able to teach back information provided and tolerated supervised application to promote improved AROM and pain management. Therapist assisting patient with understanding electrode placement on arm to help with carryover outside of therapy.Pt completing functional grasp with completion.   Modalities Skin Integrity Assessment:  NMES utilized: No redness, irritation or skin integrity concerns were noted before, during and/or after use. Patient was informed of risks and benefits to treatment today. Skin integrity prior to treatment: Intact. Skin integrity after treatment: Intact.  PATIENT EDUCATION: Education details: NMES use person educated: Patient and Software engineer in caregiver - [name] Education method: Explanation, Demonstration, Tactile cues, and Verbal cues,  Education comprehension: verbalized understanding, returned demonstration, and needs further education Patient desires specific list of exercises by groupings.  HOME EXERCISE PROGRAM: [email or web address] 10/27/22 - Theraputty  Access Code: [email or web address]  11/11/22 - Elbow Extension Access Code: [email or web address]  11/18/22 Wheelchair Push up Access Code: [email or web address] 11/25/2022: NMES use  GOALS:   SHORT TERM GOALS: Target date: 11/09/22  Patient will be able to use AE/modified techniques to cut soft foods small enough for oral intake. Baseline: Caregiver/spouse assist Goal status: IN PROGRESS  2.  Patient will be able to use AE/modified positioning to complete word search by drawing lines through words with 0 errors. Baseline: TBD Goal status: NOT MET - Goal discontinued as patient  focusing on writing more than word search ie) note taking etc.  3.  Patient will verbalize understanding of good pressure relief schedule (for 15 to 60 seconds every 15 to 60 minutes) to help with wound healing. Baseline: Patient did not change positioning in > 45 minutes of OT eval. Goal status: MET  4.  Patient will be assisted to explore modifications for leisure tasks (knit/sew/bake) with good return demonstration. Baseline: Minimal involvement Goal status: IN PROGRESS  11/17/22 - trialled knitting   5.  Patient will demonstrate independence with HEP for UE strengthening, coordination and ROM to prevent contractures and maintain strength for transfers and ADLs. Baseline: Previous HEPs have been established but need to be reviewed and updated.  Goal status: IN PROGRESS  11/17/22 working on lists and categorizing activities  6.  Patient will be assessed for typing speed/dexterity. Baseline: Patient reports difficulty with typing with all her fingers. Goal status: IN PROGRESS  7. Patient will complete Quick Dash UE assessment. Baseline: TBD Goal Status: 10/20/22 Discontinued - see scores above   LONG TERM GOALS: Target date: 12/10/22  Patient will complete 1 small craft/week r/t her leisure interests to work on FMS daily. Baseline: Minimal involvement Goal status: IN PROGRESS  2.  Patient will improve B coordination for increased typing speed/dexterity x 1-2 WPM. Baseline: TBD Goal status: IN PROGRESS  11/17/22 daily engagement with typing at home although modified use of digits for speed  3.  Patient will improve B UE coordination, strength and functional use to bake cookies with setup assistance and AE/modified techniques as appropriate.  Baseline: Not performed. Goal status: IN PROGRESS  4.  Patient will report no more than moderate difficulty using a knife to foods such as chicken, small enough for oral intake using AE and strategies as needed. Baseline: Caregiver/spouse  assist Goal status: IN PROGRESS  5.  Patient will be able to use AE/modified positioning to complete 4  sentences with 100% legibility. Baseline: Subjective reports of difficulty with writing Goal status: IN PROGRESS   ASSESSMENT:  CLINICAL IMPRESSION:   Patient demonstrated good understanding and response to NMES use as needed to promote improved ROM at home. She should not require additional NMES education. Proceed to remaining goals.    PERFORMANCE DEFICITS: in functional skills including ADLs, IADLs, coordination, dexterity, strength, muscle spasms, Fine motor control, Gross motor control, continence, skin integrity, and UE functional use,   IMPAIRMENTS: are limiting patient from ADLs, IADLs, work, and leisure.   CO-MORBIDITIES: has co-morbidities such as incontinence and wound  that affects occupational performance. Patient will benefit from skilled OT to address above impairments and improve overall function.  REHAB POTENTIAL: Fair due to chronicity of injury   PLAN:  OT FREQUENCY: 2x/week  OT DURATION: 8 weeks  PLANNED INTERVENTIONS: self care/ADL training, therapeutic exercise, therapeutic activity, neuromuscular re-education, manual therapy, passive range of motion, balance training, functional mobility training, splinting, patient/family education, energy conservation, coping strategies training, and DME and/or AE instructions  RECOMMENDED OTHER SERVICES: Patient was seen for PT evaluation today with treatment plans coordinated for 2x/week.  CONSULTED AND AGREED WITH PLAN OF CARE: Patient and family member/caregiver  PLAN FOR NEXT SESSION: DISCUSS EXTENSION INTO JULY or NO  Review positioning of R hand to help with finger opposition and isolation of finger joints.  Continue to update BUE HEP (by category) including administration of HEP calendar. May use (.pdexchart) for template.  Also -- combine all MedBridge exercises to 1 program.  Continue FM tasks to improve  typing shoes and progressing simple hobby/craft activities (ie baking, yarn activities & baking).   Delana Meyer, OT 11/29/2022, 5:53 PM

## 2022-12-01 ENCOUNTER — Encounter: Payer: Self-pay | Admitting: Physical Therapy

## 2022-12-01 NOTE — Therapy (Signed)
OUTPATIENT PHYSICAL THERAPY NEURO TREATMENT   Patient Name: [name] MRN: [id number] DOB:Apr 05, 1951, 72 y.o., female Today's Date: 12/01/2022   PCP: [name], MD REFERRING PROVIDER: [name], MD   END OF SESSION:   11/29/22 0936  PT Visits / Re-Eval  Visit Number 14  Number of Visits 17 (16 + eval)  Date for PT Re-Evaluation 12/10/22  Authorization  Authorization Type [employer]IC[employer]  Progress Note Due on Visit 20  PT Time Calculation  PT Start Time 0936  PT Stop Time 1019  PT Time Calculation (min) 43 min  PT - End of Session  Equipment Utilized During Treatment Gait belt  Activity Tolerance Patient tolerated treatment well  Behavior During Therapy WFL for tasks assessed/performed   Past Medical History:  Diagnosis Date   CERVICAL POLYP 03/11/2008   Qualifier: Diagnosis of  By: [name] MD, [name]    Colon polyps 2005   on colonscopy [name]   Fibroid 2004   Per Dr. [name]   History of shingles    face and mouth   Hx of skin cancer, basal cell    Rosacea    Sciatica of left side 09/28/2013   Scoliosis    noted on mri done for back pain   Past Surgical History:  Procedure Laterality Date   BUNIONECTOMY     Patient Active Problem List   Diagnosis Date Noted   Buttock wound, left, initial encounter 11/02/2022   Orthostatic hypotension 08/13/2022   Neurogenic bowel 05/03/2022   Spasticity 05/03/2022   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/03/2022   Nerve pain 05/03/2022   Medication monitoring encounter 01/08/2022   Neurogenic bladder 10/11/2021   Urinary incontinence 10/11/2021   ESBL (extended spectrum beta-lactamase) producing bacteria infection 10/09/2021   Recurrent UTI 10/09/2021   Quadriplegia, C5-C7 incomplete (HCC) 01/16/2021   History of spinal fracture 01/16/2021   Suprapubic catheter (HCC) 01/16/2021   Encounter for routine gynecological examination 09/28/2013   Onychomycosis 09/28/2013   Foot deformity, acquired 03/26/2012    Encounter for preventive health examination 12/25/2010   ROSACEA 08/25/2009   Disturbance in sleep behavior 03/11/2008   SKIN CANCER, HX OF 03/11/2008   DYSURIA, HX OF 03/11/2008   Hyperlipidemia 02/10/2007   CERVICALGIA 02/10/2007    ONSET DATE: 09/28/2022 (most recent referral)  REFERRING DIAG: G82.54 (ICD-10-CM) - Quadriplegia, C5-C7, incomplete (HCC)  THERAPY DIAG:  Muscle weakness (generalized)  Other abnormalities of gait and mobility  Other symptoms and signs involving the nervous system  Abnormal posture  Rationale for Evaluation and Treatment: Rehabilitation  SUBJECTIVE:  SUBJECTIVE STATEMENT: Pt received seated in her PWC. Pt wearing her wound vac again.  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 Yes: NPRS scale: 3/10 Pain location: forearms to fingertips Pain description: constant, pinprick/tingling Aggravating factors: nighttime Relieving factors: nothing, sometimes medicines  PRECAUTIONS: Fall; suprapubic catheter (she wants to get this removed meaning she needs to get to and from the toilet); she has had minor heat sensation when needing to complete her bowel program-possible AD?  WEIGHT BEARING RESTRICTIONS: No-pt was in stander at most recent therapy in [place] last week.  She will have a bone density done soon w/ Dr. [name].  FALLS: Has patient fallen in last 6 months? No  LIVING ENVIRONMENT: Lives with: lives with their spouse and live-in nurse [name] Lives in: House/apartment-townhouse Stairs: No-level entry, but multi-level home 4 stories w/ elevator Has following equipment at home: Wheelchair (power), Wheelchair (manual), Grab bars, and  standing frame, sliding board, transport shower chair, handheld shower head, leg strap-pt reports she no longer finds this helpful  PLOF: Requires assistive device for independence, Needs assistance with ADLs, Needs assistance with homemaking, and Needs assistance with transfers  OCCUPATION:  Writer-uses Dragon to dictate  PATIENT GOALS: "Make my right leg work."  Stand and pivot so she can more easily access a toilet.  OBJECTIVE:   DIAGNOSTIC FINDINGS: No recent relevant imaging.  Bone density scheduled 10/15/2022.  COGNITION: Overall cognitive status: Within functional limits for tasks assessed   SENSATION: Light touch: Diminished ability to distinguish sharp and dull, but able to distinguish light touch from injury level down accurately  COORDINATION: Not formally assessed.  EDEMA:  Well managed w/ lymphatic massage and compression stockings.  MUSCLE TONE: Pt has intermittent clonus during transfers.  POSTURE: rounded shoulders, posterior pelvic tilt, and right thoracic scoliosis   LOWER EXTREMITY ROM:     Passive  Right 10/20/22 Left 10/20/22  Hip flexion [facility]  Hip extension    Hip abduction    Hip adduction    Hip internal rotation [facility] WFL  Hip external rotation [facility]  Knee flexion [facility] WFL  Knee extension [facility] WFL  Ankle dorsiflexion Slight PF contracture Slight PF contracture  Ankle plantarflexion    Ankle inversion    Ankle eversion     (Blank rows = not tested)    LOWER EXTREMITY MMT:    MMT Right 10/20/22 Left 10/20/22  Hip flexion 1 2+  Hip extension    Hip abduction    Hip adduction    Hip internal rotation    Hip external rotation    Knee flexion 2- 3  Knee extension 2- 3  Ankle dorsiflexion 0 3  Ankle plantarflexion    Ankle inversion    Ankle eversion     (Blank rows = not tested)   BED MOBILITY:  Sit to supine Mod A Supine to sit Mod A Rolling to Right Mod A Rolling to Left Mod A Undulating mattress for wound management on standard  bed (elevated-so often doing uphill sliding board transfers); she would like to continue working on sitting up independently, she has been working on rolling, needs less assistance w/ this when someone props her leg into hooklying; would like something to help her pull her left leg to her butt for stretching as well as bed mobility.  FUNCTIONAL TESTS:  None relevant to pt's current  functional level and abilities.  PATIENT SURVEYS:  None completed due to time.  TODAY'S TREATMENT:        TherAct/NMR: Pt received seated in PWC.  She transfers to the left x4 lateral scoot/squat pivot w/ minA for LE repositioning on foot plate for improved mechanical advantage and catheter line management for safety.  Caregiver positions wound vac and line out of the way.  PT provides modA to transition to supine managing BLE.  In supine working with leg lifter PT had pt trial both firm and flexible ring with management of LE mostly for wrist comfort and best mechanical advantage for available UE function of patient.  Ultimately, using leg lifter the traditional way was most comfortable, but pt prefers BUE to prevent flexible strap from creating pressure on wrists.  Even with stiff loop around foot she requires minA for placement partially due to length of lifter.  Practiced heel slides x10 on each LE w/ PT providing knee unlocking primarily on RLE, shoes off for frictionless surface.  Education provided on using leg lifter with strap on lateral aspect of leg to abduct the leg towards the edge of the bed and inside the leg to help adduct the leg when getting in the bed.  Pt returned demo of this several time with the left LE.  She is best able to control this movement with her LE in a flexed position.  For improved core engagement, sequencing, and independence to rolling supine to side-lying, boom whacker w/ yellow theraband tied to each end placed around patient's shoulder blade region w/ BUE performing press-up to assess  length and resistance.  Progressed to pt using some momentum and resistive feedback to swing into partial > full side-lying over roughly 8 reps, repeated 3x.  Pt requires minA to place and maintain necessary LE into hook-lying prior to attempts with cuing to engage shoulder to initial part of transfer to clear shoulder blade.  Repeated bilaterally with pt having more difficulty achieving left side-lying.  Progressed task to red theraband with repeat of activity, due to resistance pt required active hand cover on RUE to maintain functional grip in elbow extension position.  Transferred back to supine independently for activity cleanup and modA to transition to sitting EOM for LE management and using PT as anchor for sit up per pt preference due to fatigue.  Returned to [facility] to right via slideboard transfer w/ minA provided by caregiver for foot placement.  Handoff to OT.  PATIENT EDUCATION: Education details:  Purpose of theraband task with functional mobility (see above).  Best use of leg lifter for patient specific needs. Person educated: Patient and Arts administrator Education method: Explanation Education comprehension: verbalized understanding  HOME EXERCISE PROGRAM: Will be established as needed as pt has done continuous therapy and is working towards functional tasks.  GOALS: Goals reviewed with patient? Yes  SHORT TERM GOALS: Target date: 11/12/2022  HEP to be established for stretching and strengthening as needed. Baseline:  Pt has established home routine and adequate caregiver assistance at this time (5/30)  Goal status: IN PROGRESS  2.  Pt will be able to perform rolling L and R w/ no more than minA in order to improve functional mobility. Baseline: modA, min A to setup LE into hookyling (5/28) Goal status: MET  3.  Pt will perform sit<>supine requiring no more than minA in order to improve functional mobility. Baseline: modA, mod A (5/28) Goal status: IN PROGRESS  4.  Pt will  perform bilateral  reach outside seated BOS with feet supported without LOB in order to improve safety with ADL performance. Baseline: lateral LOB bilaterally demonstrated in w/c; pt able to reach 2-3 inches outside BOS w/ moderate wobble (R worse than L - 5/30) Goal status: MET  LONG TERM GOALS: Target date: 12/10/2022  Pt will perform squat pivot transfer w/ no more than modA in order to improve access to home environment for toilet transfers. Baseline: minA for lateral scoot transfer, pt able to clear bottom intermittently Goal status: INITIAL  2.  Pt will perform sit<>supine requiring no more than supervision in order to improve transfers in and out of bed. Baseline:  modA Goal status: INITIAL  3.  Pt will report wound healing in order to return to day program at [facility]. Baseline:  Ischial wound Goal status: INITIAL  4.  Pt will be able to perform rolling L and R w/ no more than supervision in order to improve functional mobility. Baseline: modA Goal status: INITIAL  5.  Pt will perform uphill sliding board transfer w/ no more than modA in order to improve transfer in home environment. Baseline:  To be assessed. Goal status: INITIAL  6.  Pt will perform partial STS in stander or from elevated power w/c to counter w/ no more than modA in order to improve capacity for higher level transfers and access to uneven surfaces. Baseline: To be assessed. Goal status: INITIAL  ASSESSMENT:  CLINICAL IMPRESSION: Performed supine tasks today focused on progression of trunk strengthening to assist pt with rolling and preparation for sitting EOB.  Also addressed further use of leg lifter to help pt achieve more independence with bed mobility.  Did discuss leg loops and while pt has these she does not like having to wear the leg loops prior to all transfers using them.  PT may incorporate them as needed in the future to further improve independent mobility as pt would likely be able to  position LE better during slide board or pop-over transfers with them.  Will continue skilled PT to address deficits outlined in ongoing POC and progress towards functional goals.   OBJECTIVE IMPAIRMENTS: decreased balance, decreased coordination, decreased mobility, difficulty walking, decreased ROM, decreased strength, hypomobility, increased edema, impaired flexibility, impaired sensation, impaired tone, impaired UE functional use, improper body mechanics, postural dysfunction, and pain.   ACTIVITY LIMITATIONS: carrying, lifting, bending, sitting, standing, squatting, stairs, transfers, bed mobility, continence, bathing, toileting, dressing, reach over head, hygiene/grooming, locomotion level, and caring for others  PARTICIPATION LIMITATIONS: meal prep, cleaning, laundry, driving, and community activity  PERSONAL FACTORS: Age, Fitness, Past/current experiences, Time since onset of injury/illness/exacerbation, and 1-2 comorbidities: intermittent AD, neurogenic bowel/bladder on active bladder training  are also affecting patient's functional outcome.   REHAB POTENTIAL: Good  CLINICAL DECISION MAKING: Evolving/moderate complexity  EVALUATION COMPLEXITY: Moderate  PLAN:  PT FREQUENCY: 2x/week  PT DURATION: 8 weeks  PLANNED INTERVENTIONS: Therapeutic exercises, Therapeutic activity, Neuromuscular re-education, Balance training, Patient/Family education, Self Care, DME instructions, Electrical stimulation, Wheelchair mobility training, Manual therapy, and Re-evaluation  PLAN FOR NEXT SESSION:  TENS or NMES to LE, leg puller for bed mobility, Further assess and address multilevel slide board transfers.  Education on pressure relief for wound healing.  Core strength, static sitting balance-perturbations.  Bed mobility.  Use stander to practice weight-bearing and core as tolerated--monitor BP due to history of OH, can work towards decreased sling support in standing, use of powderboard on mat  table for RLE strengthening, wants to work on  increasing independence with lateral scoot transfers without SB and also wants to be able to place SB independently and move her LE more independently; use of leg loops/lifters to increase independence with LE management with transfers and bed mobility, pt asking about scheduling more appointments-can have her schedule 2x/ for 4 more weeks passed current cert in preparation for re-cert!, squat vs stand pivot transfers - left leg likely more successful for stand pivot at current.  Sadie Haber, PT, DPT 12/01/2022, 6:35 AM

## 2022-12-02 ENCOUNTER — Ambulatory Visit: Payer: Medicare PPO | Admitting: Physical Therapy

## 2022-12-02 ENCOUNTER — Ambulatory Visit: Payer: Medicare PPO | Admitting: Occupational Therapy

## 2022-12-02 DIAGNOSIS — R29818 Other symptoms and signs involving the nervous system: Secondary | ICD-10-CM | POA: Diagnosis not present

## 2022-12-02 DIAGNOSIS — G8253 Quadriplegia, C5-C7 complete: Secondary | ICD-10-CM | POA: Diagnosis not present

## 2022-12-02 DIAGNOSIS — R293 Abnormal posture: Secondary | ICD-10-CM | POA: Diagnosis not present

## 2022-12-02 DIAGNOSIS — R2689 Other abnormalities of gait and mobility: Secondary | ICD-10-CM

## 2022-12-02 DIAGNOSIS — G8254 Quadriplegia, C5-C7 incomplete: Secondary | ICD-10-CM | POA: Diagnosis not present

## 2022-12-02 DIAGNOSIS — M6281 Muscle weakness (generalized): Secondary | ICD-10-CM | POA: Diagnosis not present

## 2022-12-02 DIAGNOSIS — M24541 Contracture, right hand: Secondary | ICD-10-CM | POA: Diagnosis not present

## 2022-12-02 DIAGNOSIS — R278 Other lack of coordination: Secondary | ICD-10-CM | POA: Diagnosis not present

## 2022-12-02 DIAGNOSIS — M24542 Contracture, left hand: Secondary | ICD-10-CM | POA: Diagnosis not present

## 2022-12-02 NOTE — Therapy (Signed)
OUTPATIENT OCCUPATIONAL THERAPY NEURO TREATMENT  Patient Name: [name] MRN: [id number] DOB:06-28-1950, 72 y.o., female Today's Date: 12/02/2022  PCP: [name], MD  REFERRING PROVIDER: [name], MD  END OF SESSION:  OT End of Session - 12/02/22 0932     Visit Number 15    Number of Visits 17    Authorization Type [employer] - requires auth, [place]    Authorization Time Period 10/12/22 - 12/10/22    Authorization - Number of Visits 17    Progress Note Due on Visit 10    OT Start Time 0933    OT Stop Time 1015    OT Time Calculation (min) 42 min    Activity Tolerance Patient tolerated treatment well    Behavior During Therapy [facility] for tasks assessed/performed                Past Medical History:  Diagnosis Date   CERVICAL POLYP 03/11/2008   Qualifier: Diagnosis of  By: [name] MD, [name]    Colon polyps 2005   on colonscopy [name]   Fibroid 2004   Per Dr. [name]   History of shingles    face and mouth   Hx of skin cancer, basal cell    Rosacea    Sciatica of left side 09/28/2013   Scoliosis    noted on mri done for back pain   Past Surgical History:  Procedure Laterality Date   BUNIONECTOMY     Patient Active Problem List   Diagnosis Date Noted   Buttock wound, left, initial encounter 11/02/2022   Orthostatic hypotension 08/13/2022   Neurogenic bowel 05/03/2022   Spasticity 05/03/2022   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/03/2022   Nerve pain 05/03/2022   Medication monitoring encounter 01/08/2022   Neurogenic bladder 10/11/2021   Urinary incontinence 10/11/2021   ESBL (extended spectrum beta-lactamase) producing bacteria infection 10/09/2021   Recurrent UTI 10/09/2021   Quadriplegia, C5-C7 incomplete (HCC) 01/16/2021   History of spinal fracture 01/16/2021   Suprapubic catheter (HCC) 01/16/2021   Encounter for routine gynecological examination 09/28/2013   Onychomycosis 09/28/2013   Foot deformity, acquired 03/26/2012   Encounter for  preventive health examination 12/25/2010   ROSACEA 08/25/2009   Disturbance in sleep behavior 03/11/2008   SKIN CANCER, HX OF 03/11/2008   DYSURIA, HX OF 03/11/2008   Hyperlipidemia 02/10/2007   CERVICALGIA 02/10/2007    ONSET DATE: 07/28/2020  Date of Referral 09/28/22  REFERRING DIAG: G82.54 (ICD-10-CM) - Quadriplegia, C5-C7, incomplete (HCC)  THERAPY DIAG:  Muscle weakness (generalized)  Other lack of coordination  Abnormal posture  Rationale for Evaluation and Treatment: Rehabilitation  SUBJECTIVE:   SUBJECTIVE STATEMENT:   Pt continues to frequently ask for the best activity or position or options for her deficits when it comes to exercises and activities.  During discussion regarding toileting activities patient reports that she has her BMs on her shower chair at home.  Patient requires use of sliding board to transition to and from the shower chair though.  Pt accompanied by:  Live in Caregiver - [name]   PERTINENT HISTORY: "Pt is a 72 yr old L handed female with hx of incomplete quadriplegia- 2/14 2022- fleeing the police in [place] on passenger 100 (high speed) miles/hour,  [name]; neurogenic bowel and bladder and spasticity; [name], [name] Here for f/u on Incomplete quadriplegia"  Referral from MD 09/28/22 states, "Please eval and treat for ADLs and higher level mobility."  PRECAUTIONS: Fall; suprapubic  catheter (she wants to get this removed meaning she needs to get to and from the toilet); she has had minor heat sensation when needing to complete her bowel program-possible AD?   WEIGHT BEARING RESTRICTIONS: No-pt was in stander at most recent therapy in [place] last week.  She will have a bone density done soon w/ Dr. [name].   PAIN:  Patient reports chronic pain 3/10 fingers to elbow bilaterally managed by medication and will inform therapy staff of any changes.  FALLS: Has patient fallen in last 6 months? No  LIVING ENVIRONMENT: Lives  with: lives with their family - husband [name] and with an adult companion s/p moving back up from [place] x10 months Lives in: House/apartment Stairs:  4 story town house with an Engineer, structural with threshold adjustments, roll in shower with transport chair Has following equipment at home: Wheelchair (power) - with seat height adjustments to access counters and reclining option, Wheelchair (manual), transport WC, shower chair, and Ramped entry, handheld showerhead with rails around toilet, had Michiel Sites but is no longer in need of it, has slide boards x3  PLOF: Requires assistive device for independence, Needs assistance with ADLs, Needs assistance with homemaking, Needs assistance with gait, and Needs assistance with transfers; 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  Used to like to knit, sew and bake.  PATIENT GOALS: Wants to be able to type - currently using advanced Dragon dictation at times but prefers to type at times.  She would like to be able to write better and is interested in resuming some leisure activities such as Archivist and baking.  She also wants to keep working on being able to cut her own food and on her UE strength.   OBJECTIVE:   HAND DOMINANCE: Left  ADLs: Overall ADLs: Patient has a live in caregiver  Transfers/ambulation related to ADLs: Mod assist with sliding board transfers (previously [employer] lift).  Eating: Has a rocker knife that she can use. Used to use adapted utensils but now uses regular utensils but still will get assistance to cut food ie) when eating out.  Grooming: can brush her own hair but unable to manage jewelry ie) earrings  UB Dressing: can zip/unzip after it has been started, unable to manage buttons herself, Caregiver assists but if she has extra time, she can put on her bra, and a loose fitting pullover shirt/t-shirt  LB Dressing: dependent for LB dressing in bed and with special sock donner for LE compression garments   Toileting: bladder  trained with suprapubic catheter which she clamps off.  Dependent for bowel incontinence care.  Bathing: Sponge bath with adult washclothes.  Can bath UB with back scrubber for most of her back.  Needs help with feet (mentioned she might need a separate brush for feet)   Tub Shower transfers: Min-mod assist with slide board  Equipment: Shower seat with back, Walk in shower, bed side commode, Reacher, Sock aid, Long handled sponge, and Feeding equipment  IADLs: --  Shopping: Assisted by caregiver  Light housekeeping: Has housekeeper that comes monthly  Meal Prep: previously enjoyed baking. Assisted by caregiver but described recent success at reheating a meal for herself after getting food out of the fridge/freezer from her WC.  Community mobility: Dependent  Medication management: Caregiver sorts them into pillbox but she is very aware of her medications   Financial management: Patient manages her own finances  Handwriting: Increased time and has a pen with a little grip  MOBILITY STATUS:  Independent with power mobiity  POSTURE COMMENTS:  No Significant postural limitations and forward head Sitting balance: Supports self independently with both Ues  ACTIVITY TOLERANCE: Activity tolerance: Fair - MMT WFL but has limited sustained tolerance for ongoing use of UEs  FUNCTIONAL OUTCOME MEASURES: 10/20/22 QuickDash 31.8 points   UPPER EXTREMITY ROM:   AROM - WFL without obvious contractures, some digital flexion noted but PROM WNL   AROM Right (eval) Left (eval)  Shoulder flexion [facility]  Shoulder abduction [facility]  Elbow flexion [facility] WFL  Elbow extension [facility]  Wrist flexion [facility] WFL  Wrist extension WFL WFL  Ulnar deviation WFL Decreased ulnar  deviation past midline  Wrist pronation [facility] WFL  Wrist supination [facility]  Digit Composite Flexion Lacks full AROM:   1st digit - 5 cm   3rd digit -1 cm   4th digit - 2 cm  Lacks full AROM:   1st digit - 1.5 cm  5th digit  - 3 cm   Digit Composite Extension [facility]  Digit Opposition Opposition to index finger only Lacks to pinkie due to limited DIP pinkie flexion  (Blank rows = not tested)  UPPER EXTREMITY MMT:   Grossly WFL - Endurance limited R tricep strength > than L but L UE generally stronger than R UE  MMT Right (eval) Left (eval)  Shoulder flexion 4/5 4/5  Shoulder abduction 4/5 4/5  Elbow flexion 4/5 4/5  Elbow extension 4/5 4-/5  (Blank rows = not tested)  HAND FUNCTION: Grip strength: Right: 4.8 lbs; Left: 20.9 lbs 11/17/22 Right - 7.2 lbs  COORDINATION: Finger Nose Finger test: R generally WFL, L WNL Box and Blocks:  Right 28 blocks, Left 37 blocks R hand finger eventually cramps and dexterity worsens in the cold  SENSATION: Light touch: Impaired  - patient   EDEMA: NA for UEs but LE has poor lymph drainage with custom compression garments   MUSCLE TONE: Generally WFL but will assess further  COGNITION: Overall cognitive status: Within functional limits for tasks assessed  VISION: Subjective report: Patent wears progressive lens/glasses.  Denies diplopia or vision changes but has eye exam in the next couple of months. Baseline vision: Wears glasses all the time  VISION ASSESSMENT: WFL  EVALUATION OBSERVATIONS: Patient independent with power WC navigation within clinic.  Patient is well-kept with foley catheter in place.  She has slight limitations in full extension of digits but PROM is WNL and she has splints at home that she said she will bring for OT staff to assess.  She has functional ROM of B UEs to reach her head, behind her back and to cross midline to assist with ADLs.     TODAY'S TREATMENT:                                                                                                                                  Therapeutic Activities  Therapist  had been working on ideas for adapting hold of knitting needle for patient.  A sample idea was presented to patient  today in which a tennis ball with a small slit for the needle [as demonstrated through a pencil was held in a "PVC DWV Spigot Hub Flush Bushing" as determined is available through [employer] and was being used in therapy office to safely cut tennis balls for walker legs.  Patient plans to bring her knitting next time to assess effectiveness of this option as the ball rotates in the circular space of the PVC product which is also slightly weighted and stable enough to sit on her leg. Updated new Therapy Binder with HEP ideas from previous interventions (as well as addition of therabar activities) and combined all MedBridge exercises into 1 program. URL: [email or web address]   Access Code: [email or web address] List if images added to binder and reviewed with patient and caregiver include: ROM - Thumb AROM Opposition To All Fingers  - 1 x daily - 10 reps - Seated Thumb Composite Flexion AROM  - 1 x daily - 10 reps - Seated Thumb IP Flexion AROM  - 1 x daily - 10 reps - Seated Thumb Composite Extension AROM  - 1 x daily - 10 reps WC Pushups - Wheelchair Pressure Relief  - 5-8 x daily - 3-5 reps Theraband - Seated Elbow Extension with Self-Anchored Resistance  - 1 x daily - 10 reps - Supine Triceps Extension with Resistance  - 1 x daily - 10 reps - Seated Bilateral Elbow Extension  - 1 x daily - 10 reps Putty - Putty Squeezes  - 1 x daily - 10 reps - Rolling Putty on Table  - 1 x daily - 10 reps - Tip PUSH with Putty  - 1 x daily - 10 reps - Finger Pinch and Pull with Putty  - 1 x daily - 10 reps - Seated Finger MP Flexion with Putty  - 1 x daily - 10 reps - Thumb Opposition with Putty  - 1 x daily - 10 reps Therabar - these are new images but familiar activities to patient as she has her own bar at home - Wrist Flexion and Extension with Resistance Bar  - 1 x daily - 10 reps - Shoulder Adduction with Resistance Bar  - 1 x daily - 10 reps - Stat[place] of [place] with Flex Bar Oscillations  - 1 x  daily - 10 reps Various lists of Coordination/Fine Motor activities are provided as part of Patient Education with 2 new handouts added to her education binder. Upon reviewing exercises and activities for placement in her education binder, patient asks for specific information about the "best" activities to work on.  Education provided to patient regarding importance of a variety of activities, different positions for activities i.e. to work on different muscle groups and how everything she does can be considered exercise.  She is also encouraged to work on some unsupported sitting activities i.e. with trunk upright and lifting her hands off her knees 1 or 2 at a time.  Finally, regarding HEP, patient was less interested in using a daily schedule for specific activities to keep up with when to do them, rather a list of the activities she could check off daily over the course of the week. PATIENT EDUCATION: Education details: Public house manager of exercises by groupings per her request.  person educated: Patient and Caregiver Live in caregiver - [name] Education method: Explanation, Demonstration, Tactile cues, and Verbal  cues,  Education comprehension: verbalized understanding, returned demonstration, and needs further education   HOME EXERCISE PROGRAM: 11/25/2022: NMES use  12/02/22: All previous HEPs combined to 1 complete List through MedBridge Access Code: [email or web address] URL: [email or web address] Date: 12/02/2022 Prepared by: Amada Kingfisher   GOALS:   SHORT TERM GOALS: Target date: 11/09/22  Patient will be able to use AE/modified techniques to cut soft foods small enough for oral intake. Baseline: Caregiver/spouse assist Goal status: IN PROGRESS  2.  Patient will be able to use AE/modified positioning to complete word search by drawing lines through words with 0 errors. Baseline: TBD Goal status: NOT MET - Goal discontinued as patient focusing on writing more  than word search ie) note taking etc.  3.  Patient will verbalize understanding of good pressure relief schedule (for 15 to 60 seconds every 15 to 60 minutes) to help with wound healing. Baseline: Patient did not change positioning in > 45 minutes of OT eval. Goal status: MET  4.  Patient will be assisted to explore modifications for leisure tasks (knit/sew/bake) with good return demonstration. Baseline: Minimal involvement Goal status: IN PROGRESS  11/17/22 - trialled knitting  12/02/22 - continued AE exploration  5.  Patient will demonstrate independence with HEP for UE strengthening, coordination and ROM to prevent contractures and maintain strength for transfers and ADLs. Baseline: Previous HEPs have been established but need to be reviewed and updated.  Goal status: IN PROGRESS  11/17/22 working on lists and categorizing activities 12/02/22 Comprehensive MedBridge list compiled  6.  Patient will be assessed for typing speed/dexterity. Baseline: Patient reports difficulty with typing with all her fingers. Goal status: IN PROGRESS  7. Patient will complete Quick Dash UE assessment. Baseline: TBD Goal Status: 10/20/22 Discontinued - see scores above   LONG TERM GOALS: Target date: 12/10/22  Patient will complete 1 small craft/week r/t her leisure interests to work on FMS daily. Baseline: Minimal involvement Goal status: IN PROGRESS  2.  Patient will improve B coordination for increased typing speed/dexterity x 1-2 WPM. Baseline: TBD Goal status: IN PROGRESS  11/17/22 daily engagement with typing at home although modified use of digits for speed  3.  Patient will improve B UE coordination, strength and functional use to bake cookies with setup assistance and AE/modified techniques as appropriate.  Baseline: Not performed. Goal status: IN PROGRESS  4.  Patient will report no more than moderate difficulty using a knife to foods such as chicken, small enough for oral intake using AE and  strategies as needed. Baseline: Caregiver/spouse assist Goal status: IN PROGRESS  5.  Patient will be able to use AE/modified positioning to complete 4 sentences with 100% legibility. Baseline: Subjective reports of difficulty with writing Goal status: IN PROGRESS   ASSESSMENT:  CLINICAL IMPRESSION:   Patient patient brought in education binder from home with plastic sleeves to insert handouts in.  Therapist had combined all of her previous exercise programs into 1 complete program she can access on MedBridge for range of motion strengthening and fine motor coordination.  Various categories are grouped together, additional exercises were added and multiple suggestions of fine motor tasks are listed as well.  Therapist will work on a list of these activities that she can check off on a regular basis.  Patient continues to request input on the best activities to work on however is encouraged to consider a wide variety of tasks to work on balance, coordination and functional tasks.  Skilled Occupational Therapy services are recommended to continue  to maximize participation in functional tasks at home while giving her the opportunity to progress home exercise ideas.  PERFORMANCE DEFICITS: in functional skills including ADLs, IADLs, coordination, dexterity, strength, muscle spasms, Fine motor control, Gross motor control, continence, skin integrity, and UE functional use,   IMPAIRMENTS: are limiting patient from ADLs, IADLs, work, and leisure.   CO-MORBIDITIES: has co-morbidities such as incontinence and wound  that affects occupational performance. Patient will benefit from skilled OT to address above impairments and improve overall function.  REHAB POTENTIAL: Fair due to chronicity of injury   PLAN:  OT FREQUENCY: 2x/week  OT DURATION: 8 weeks  PLANNED INTERVENTIONS: self care/ADL training, therapeutic exercise, therapeutic activity, neuromuscular re-education, manual therapy, passive  range of motion, balance training, functional mobility training, splinting, patient/family education, energy conservation, coping strategies training, and DME and/or AE instructions  RECOMMENDED OTHER SERVICES: Patient was seen for PT evaluation today with treatment plans coordinated for 2x/week.  CONSULTED AND AGREED WITH PLAN OF CARE: Patient and family member/caregiver  PLAN FOR NEXT SESSION: PROGRESS NOTE with update to goals  Functional kitchen activities.  Reviewed knitting modifications introduced if patient brings activity from home.  Review positioning of R hand to help with finger opposition and isolation of finger joints.  Continue FM tasks to improve typing shoes and progressing simple hobby/craft activities (ie baking, yarn activities & baking).   Victorino Sparrow, OT 12/02/2022, 12:05 PM

## 2022-12-02 NOTE — Patient Instructions (Signed)
Comprehensive List of Exercises and Activities  -ROM -Putty -Bands -Therabar -FM/Coordination  Access Code: [email or web address] URL: [email or web address] Date: 12/02/2022 Prepared by: Amada Kingfisher  Exercises - Thumb AROM Opposition To All Fingers  - 1 x daily - 10 reps - Seated Thumb Composite Flexion AROM  - 1 x daily - 10 reps - Seated Thumb IP Flexion AROM  - 1 x daily - 10 reps - Seated Thumb Composite Extension AROM  - 1 x daily - 10 reps - Wheelchair Pressure Relief  - 5-8 x daily - 3-5 reps - Seated Elbow Extension with Self-Anchored Resistance  - 1 x daily - 10 reps - Supine Triceps Extension with Resistance  - 1 x daily - 10 reps - Seated Bilateral Elbow Extension  - 1 x daily - 10 reps - Putty Squeezes  - 1 x daily - 10 reps - Rolling Putty on Table  - 1 x daily - 10 reps - Tip PUSH with Putty  - 1 x daily - 10 reps - Finger Pinch and Pull with Putty  - 1 x daily - 10 reps - Seated Finger MP Flexion with Putty  - 1 x daily - 10 reps - Thumb Opposition with Putty  - 1 x daily - 10 reps - Wrist Flexion and Extension with Resistance Bar  - 1 x daily - 10 reps - Shoulder Adduction with Resistance Bar  - 1 x daily - 10 reps - Stat[place] of [place] with Flex Bar Oscillations  - 1 x daily - 10 reps  Patient Education - Fine Motor Exercise - English - Fine Motor Task Ideas

## 2022-12-02 NOTE — Therapy (Signed)
OUTPATIENT PHYSICAL THERAPY NEURO TREATMENT   Patient Name: [name] MRN: [id number] DOB:Jan 13, 1951, 72 y.o., female Today's Date: 12/02/2022   PCP: [name], MD REFERRING PROVIDER: [name], MD   END OF SESSION:  PT End of Session - 12/02/22 1019     Visit Number 15    Number of Visits 17   16 + eval   Date for PT Re-Evaluation 12/10/22    Authorization Type [employer]    Progress Note Due on Visit 20    PT Start Time 1020   from OT session   PT Stop Time 1103    PT Time Calculation (min) 43 min    Equipment Utilized During Treatment Gait belt    Activity Tolerance Patient tolerated treatment well    Behavior During Therapy WFL for tasks assessed/performed              PT End of Session - 12/02/22 1019     Visit Number 15    Number of Visits 17   16 + eval   Date for PT Re-Evaluation 12/10/22    Authorization Type [employer]    Progress Note Due on Visit 20    PT Start Time 1020   from OT session   PT Stop Time 1103    PT Time Calculation (min) 43 min    Equipment Utilized During Treatment Gait belt    Activity Tolerance Patient tolerated treatment well    Behavior During Therapy WFL for tasks assessed/performed             Past Medical History:  Diagnosis Date   CERVICAL POLYP 03/11/2008   Qualifier: Diagnosis of  By: [name] MD, [name]    Colon polyps 2005   on colonscopy [name]   Fibroid 2004   Per Dr. [name]   History of shingles    face and mouth   Hx of skin cancer, basal cell    Rosacea    Sciatica of left side 09/28/2013   Scoliosis    noted on mri done for back pain   Past Surgical History:  Procedure Laterality Date   BUNIONECTOMY     Patient Active Problem List   Diagnosis Date Noted   Buttock wound, left, initial encounter 11/02/2022   Orthostatic hypotension 08/13/2022   Neurogenic bowel 05/03/2022   Spasticity 05/03/2022   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/03/2022   Nerve pain 05/03/2022    Medication monitoring encounter 01/08/2022   Neurogenic bladder 10/11/2021   Urinary incontinence 10/11/2021   ESBL (extended spectrum beta-lactamase) producing bacteria infection 10/09/2021   Recurrent UTI 10/09/2021   Quadriplegia, C5-C7 incomplete (HCC) 01/16/2021   History of spinal fracture 01/16/2021   Suprapubic catheter (HCC) 01/16/2021   Encounter for routine gynecological examination 09/28/2013   Onychomycosis 09/28/2013   Foot deformity, acquired 03/26/2012   Encounter for preventive health examination 12/25/2010   ROSACEA 08/25/2009   Disturbance in sleep behavior 03/11/2008   SKIN CANCER, HX OF 03/11/2008   DYSURIA, HX OF 03/11/2008   Hyperlipidemia 02/10/2007   CERVICALGIA 02/10/2007    ONSET DATE: 09/28/2022 (most recent referral)  REFERRING DIAG: G82.54 (ICD-10-CM) - Quadriplegia, C5-C7, incomplete (HCC)  THERAPY DIAG:  Muscle weakness (generalized)  Other abnormalities of gait and mobility  Abnormal posture  Quadriplegia, C5-C7 incomplete (HCC)  Rationale for Evaluation and Treatment: Rehabilitation  SUBJECTIVE:  SUBJECTIVE STATEMENT: Pt reports she is doing well, no changes since last session. Pt's husband has purchased a plane and pt wanting to travel on it next Friday, asking for ideas about how to get/in of the plane safely.  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 Yes: NPRS scale: 3/10 Pain location: forearms to fingertips Pain description: constant, pinprick/tingling Aggravating factors: nighttime Relieving factors: nothing, sometimes medicines  PRECAUTIONS: Fall; suprapubic catheter (she wants to get this removed meaning she  needs to get to and from the toilet); she has had minor heat sensation when needing to complete her bowel program-possible AD?  WEIGHT BEARING RESTRICTIONS: No-pt was in stander at most recent therapy in [place] last week.  She will have a bone density done soon w/ Dr. [name].  FALLS: Has patient fallen in last 6 months? No  LIVING ENVIRONMENT: Lives with: lives with their spouse and live-in nurse [name] Lives in: House/apartment-townhouse Stairs: No-level entry, but multi-level home 4 stories w/ elevator Has following equipment at home: Wheelchair (power), Wheelchair (manual), Grab bars, and standing frame, sliding board, transport shower chair, handheld shower head, leg strap-pt reports she no longer finds this helpful  PLOF: Requires assistive device for independence, Needs assistance with ADLs, Needs assistance with homemaking, and Needs assistance with transfers  OCCUPATION:  Writer-uses Dragon to dictate  PATIENT GOALS: "Make my right leg work."  Stand and pivot so she can more easily access a toilet.  OBJECTIVE:   DIAGNOSTIC FINDINGS: No recent relevant imaging.  Bone density scheduled 10/15/2022.  COGNITION: Overall cognitive status: Within functional limits for tasks assessed   SENSATION: Light touch: Diminished ability to distinguish sharp and dull, but able to distinguish light touch from injury level down accurately  COORDINATION: Not formally assessed.  EDEMA:  Well managed w/ lymphatic massage and compression stockings.  MUSCLE TONE: Pt has intermittent clonus during transfers.  POSTURE: rounded shoulders, posterior pelvic tilt, and right thoracic scoliosis   LOWER EXTREMITY ROM:     Passive  Right 10/20/22 Left 10/20/22  Hip flexion [facility]  Hip extension    Hip abduction    Hip adduction    Hip internal rotation [facility] WFL  Hip external rotation [facility]  Knee flexion [facility] WFL  Knee extension [facility] WFL  Ankle dorsiflexion Slight PF contracture Slight PF  contracture  Ankle plantarflexion    Ankle inversion    Ankle eversion     (Blank rows = not tested)    LOWER EXTREMITY MMT:    MMT Right 10/20/22 Left 10/20/22  Hip flexion 1 2+  Hip extension    Hip abduction    Hip adduction    Hip internal rotation    Hip external rotation    Knee flexion 2- 3  Knee extension 2- 3  Ankle dorsiflexion 0 3  Ankle plantarflexion    Ankle inversion    Ankle eversion     (Blank rows = not tested)   BED MOBILITY:  Sit to supine Mod A Supine to sit Mod A Rolling to Right Mod A Rolling to Left Mod A Undulating mattress for wound management on standard bed (elevated-so often doing uphill sliding board transfers); she would like to continue working on sitting up independently, she has been working on rolling, needs less assistance w/ this when someone props her leg into hooklying; would like something to  help her pull her left leg to her butt for stretching as well as bed mobility.  FUNCTIONAL TESTS:  None relevant to pt's current functional level and abilities.  PATIENT SURVEYS:  None completed due to time.  TODAY'S TREATMENT:        TherAct/NMR: Pt received seated in her PWC from OT session. Bump transfer PWC to mat table with min A for some LE management. Session focus on use of stedy for standing for decreased support as compared to with use of standing frame in previous sessions. Pt is min A to stand to stedy initially from elevated mat table. Pt is able to tolerate standing x 30 sec at most in stedy with heavy UE reliance. Pt does exhibit good upright posture and trunk extension in standing, difficulty extending her knees with reliance on shin guard on device to block LE. Pt does exhibit activation of her quads and her glutes in standing but muscles fatigue quickly. Pt is able to stand x 5 reps from perched position on stedy seat.  While working on standing discussed pt's goal of being able to transfer on/off of her private plane. Encouraged  her and her caregiver [name] to photograph the entrance to the plane as well as the seat she would be transferring to once on the plane so that therapy can assist with problem-solving safe ways for her to be able to do this in future sessions. Also discussed pt's LTG of being able to perform a stand-pivot transfer. Progression from standing frame to stedy is a good step towards reaching this goal. Discussed that future sessions can work towards standing in // bars to assess LE strength and control in a less supported environment. Also discussed possible use of KAFOs and/or knee immobilizers in the future in order to lock out knees in standing. Pt transferred back to her PWC from mat table at end of session via bumping transfer, mod A for some LE management as well as minor assistance provided at hips due to pt fatigue level and gap between PWC and mat table.  Discussed medial block to prevent R hip adduction while seated in PWC. Pt reports that [employer] is coming to her house tomorrow to work on her PWC so she can ask then about a medial thigh guide vs any other recommendations to prevent excessive R hip adductions. Discussed long-term effects of limb being positioned in adductive including muscle lengthening, muscle shortening, and possible pressure sore development on medial knee.  PATIENT EDUCATION: Education details:  purpose of standing progression with stedy this session, see above Person educated: Patient and Arts administrator Education method: Explanation Education comprehension: verbalized understanding  HOME EXERCISE PROGRAM: Will be established as needed as pt has done continuous therapy and is working towards functional tasks.  GOALS: Goals reviewed with patient? Yes  SHORT TERM GOALS: Target date: 11/12/2022  HEP to be established for stretching and strengthening as needed. Baseline:  Pt has established home routine and adequate caregiver assistance at this time (5/30)  Goal status: IN  PROGRESS  2.  Pt will be able to perform rolling L and R w/ no more than minA in order to improve functional mobility. Baseline: modA, min A to setup LE into hookyling (5/28) Goal status: MET  3.  Pt will perform sit<>supine requiring no more than minA in order to improve functional mobility. Baseline: modA, mod A (5/28) Goal status: IN PROGRESS  4.  Pt will perform bilateral reach outside seated BOS with feet supported without LOB  in order to improve safety with ADL performance. Baseline: lateral LOB bilaterally demonstrated in w/c; pt able to reach 2-3 inches outside BOS w/ moderate wobble (R worse than L - 5/30) Goal status: MET  LONG TERM GOALS: Target date: 12/10/2022  Pt will perform squat pivot transfer w/ no more than modA in order to improve access to home environment for toilet transfers. Baseline: minA for lateral scoot transfer, pt able to clear bottom intermittently Goal status: INITIAL  2.  Pt will perform sit<>supine requiring no more than supervision in order to improve transfers in and out of bed. Baseline:  modA Goal status: INITIAL  3.  Pt will report wound healing in order to return to day program at [facility]. Baseline:  Ischial wound Goal status: INITIAL  4.  Pt will be able to perform rolling L and R w/ no more than supervision in order to improve functional mobility. Baseline: modA Goal status: INITIAL  5.  Pt will perform uphill sliding board transfer w/ no more than modA in order to improve transfer in home environment. Baseline:  To be assessed. Goal status: INITIAL  6.  Pt will perform partial STS in stander or from elevated power w/c to counter w/ no more than modA in order to improve capacity for higher level transfers and access to uneven surfaces. Baseline: To be assessed. Goal status: INITIAL  ASSESSMENT:  CLINICAL IMPRESSION: Emphasis of skilled PT session on progressing from use of standing frame for standing to use of stedy this  session. Pt exhibits good core control in standing with good ability to maintain upright posture with min A. Pt does fatigue very quickly in standing in stedy due to decreased assist from device as compared to with standing frame. Pt with no signs/symptoms of OH during standing this session. Pt can benefit from continued skilled therapy services to work towards progressing her tolerance and independence with standing to work towards LTG of being able to perform a stand pivot transfer as well as other LTGs. Continue POC.    OBJECTIVE IMPAIRMENTS: decreased balance, decreased coordination, decreased mobility, difficulty walking, decreased ROM, decreased strength, hypomobility, increased edema, impaired flexibility, impaired sensation, impaired tone, impaired UE functional use, improper body mechanics, postural dysfunction, and pain.   ACTIVITY LIMITATIONS: carrying, lifting, bending, sitting, standing, squatting, stairs, transfers, bed mobility, continence, bathing, toileting, dressing, reach over head, hygiene/grooming, locomotion level, and caring for others  PARTICIPATION LIMITATIONS: meal prep, cleaning, laundry, driving, and community activity  PERSONAL FACTORS: Age, Fitness, Past/current experiences, Time since onset of injury/illness/exacerbation, and 1-2 comorbidities: intermittent AD, neurogenic bowel/bladder on active bladder training  are also affecting patient's functional outcome.   REHAB POTENTIAL: Good  CLINICAL DECISION MAKING: Evolving/moderate complexity  EVALUATION COMPLEXITY: Moderate  PLAN:  PT FREQUENCY: 2x/week  PT DURATION: 8 weeks  PLANNED INTERVENTIONS: Therapeutic exercises, Therapeutic activity, Neuromuscular re-education, Balance training, Patient/Family education, Self Care, DME instructions, Electrical stimulation, Wheelchair mobility training, Manual therapy, and Re-evaluation  PLAN FOR NEXT SESSION:  TENS or NMES to LE, leg puller for bed mobility, Further  assess and address multilevel slide board transfers.  Education on pressure relief for wound healing.  Core strength, static sitting balance-perturbations.  Bed mobility.  Use stander to practice weight-bearing and core as tolerated--monitor BP due to history of OH, can work towards decreased sling support in standing, use of powderboard on mat table for RLE strengthening, wants to work on increasing independence with lateral scoot transfers without SB and also wants to be able to  place SB independently and move her LE more independently; use of leg loops/lifters to increase independence with LE management with transfers and bed mobility, standing in // bars with knees blocked or knee immobilizers to assess standing without stedy, pt and [name] to bring in pics of plane to problem solve getting in/out of actual plane as well as transferring on/off seat [place] wants to be able to fly next Friday 6/28)  Peter Congo, PT Peter Congo, PT, DPT, CSRS  12/02/2022, 11:03 AM

## 2022-12-03 NOTE — Progress Notes (Signed)
[name] [name] ([id number]) [id number] Page 1 of 8 Visit Report for 11/29/2022 Arrival Information Details Patient Name: Date of Service: [name], [name]. 11/29/2022 12:30 PM Medical Record Number: [id number] Patient Account Number: [id number] Date of Birth/Sex: Treating RN: 1951/02/21 (72 y.o. [name] Primary Care [name]: [name] Other Clinician: Referring [name]: Treating [name]/Extender: [name] in Treatment: 7 Visit Information History Since Last Visit All ordered tests and consults were completed: Yes Patient Arrived: Wheel Chair Added or deleted any medications: No Arrival Time: 12:34 Any new allergies or adverse reactions: No Accompanied By: caregiver Had a fall or experienced change in No Transfer Assistance: Transfer Board activities of daily living that may affect Patient Requires Transmission-Based Precautions: No risk of falls: Patient Has Alerts: No Signs or symptoms of abuse/neglect since last visito No Hospitalized since last visit: No Implantable device outside of the clinic excluding No cellular tissue based products placed in the center since last visit: Has Dressing in Place as Prescribed: Yes Pain Present Now: No Electronic Signature(s) Signed: 12/03/2022 10:19:19 AM By: [name] Entered By: [name] on 11/29/2022 12:50:39 -------------------------------------------------------------------------------- Encounter Discharge Information Details Patient Name: Date of Service: Carmen [name] [name]. 11/29/2022 12:30 PM Medical Record Number: [id number] Patient Account Number: [id number] Date of Birth/Sex: Treating RN: March 04, 1951 (72 y.o. [name] Primary Care Harlee Pursifull: [name] Other Clinician: Referring [name]: Treating [name]/Extender: [name] in Treatment: 7 Encounter Discharge Information Items Discharge  Condition: Stable Ambulatory Status: Wheelchair Discharge Destination: Home Transportation: Private Auto Accompanied By: nurse Schedule Follow-up Appointment: Yes Clinical Summary of Care: Patient Declined Electronic Signature(s) Signed: 12/03/2022 10:19:19 AM By: [name] Entered By: [name] on 11/29/2022 13:34:32 [name] ([id number]) [id number] Page 2 of 8 -------------------------------------------------------------------------------- Lower Extremity Assessment Details Patient Name: Date of Service: Carmen [name] [name]. 11/29/2022 12:30 PM Medical Record Number: [id number] Patient Account Number: [id number] Date of Birth/Sex: Treating RN: [birth year] (72 y.o. [name] Primary Care Angelice Piech: [name] Other Clinician: Referring Krishauna Schatzman: Treating [name]/Extender: [name] in Treatment: 7 Electronic Signature(s) Signed: 12/03/2022 10:19:19 AM By: [name] Entered By: [name] on 11/29/2022 12:56:16 -------------------------------------------------------------------------------- Multi Wound Chart Details Patient Name: Date of Service: Carmen [name] [name]. 11/29/2022 12:30 PM Medical Record Number: [id number] Patient Account Number: [id number] Date of Birth/Sex: Treating RN: 1950/08/15 (72 y.o. [name]) Primary Care Calandria Mullings: [name] Other Clinician: Referring [name]: Treating [name]/Extender: [name] in Treatment: 7 Vital Signs Height(in): 64 Pulse(bpm): 86 Weight(lbs): 115 Blood Pressure(mmHg): 84/49 Body Mass Index(BMI): 19.7 Temperature([name]): 99.1 Respiratory Rate(breaths/min): 18 [1:Photos:] [N/A:N/A] Left Gluteus N/A N/A Wound Location: Pressure Injury N/A N/A Wounding Event: Pressure Ulcer N/A N/A Primary Etiology: Cataracts, Lymphedema, N/A N/A Comorbid History: Hypotension, Quadriplegia 07/15/2022 N/A N/A Date Acquired: 7  N/A N/A Weeks of Treatment: Open N/A N/A Wound Status: No N/A N/A Wound Recurrence: 0.3x0.3x0.7 N/A N/A Measurements L x W x D (cm) 0.071 N/A N/A A (cm) : rea 0.049 N/A N/A Volume (cm) : 81.20% N/A N/A % Reduction in A rea: 90.00% N/A N/A % Reduction in Volume: 12 Position 1 (o'clock): 2.8 Maximum Distance 1 (cm): Yes N/A N/A Tunneling: Category/Stage III N/A N/A Classification: Medium N/A N/A Exudate A mount: Serosanguineous N/A N/A Exudate Type: red, brown N/A N/A Exudate Color: Distinct, outline attached N/A N/A Wound MarginRASHAWN, [name] ([id number]) [id number] Page 3 of 8 Medium (34-66%) N/A N/A Granulation  Amount: Red, Pink N/A N/A Granulation Quality: Medium (34-66%) N/A N/A Necrotic Amount: Fat Layer (Subcutaneous Tissue): Yes N/A N/A Exposed Structures: Fascia: No Tendon: No Muscle: No Joint: No Bone: No Small (1-33%) N/A N/A Epithelialization: Excoriation: No N/A N/A Periwound Skin Texture: Induration: No Callus: No Crepitus: No Rash: No Scarring: No Maceration: No N/A N/A Periwound Skin Moisture: Dry/Scaly: No Atrophie Blanche: No N/A N/A Periwound Skin Color: Cyanosis: No Ecchymosis: No Erythema: No Hemosiderin Staining: No Mottled: No Pallor: No Rubor: No Negative Pressure Wound Therapy N/A N/A Procedures Performed: Maintenance (NPWT) Treatment Notes Electronic Signature(s) Signed: 11/29/2022 4:37:01 PM By: Carmen Corwin DO Entered By: [name] on 11/29/2022 13:27:06 -------------------------------------------------------------------------------- Multi-Disciplinary Care Plan Details Patient Name: Date of Service: Carmen [name] [name]. 11/29/2022 12:30 PM Medical Record Number: [id number] Patient Account Number: [id number] Date of Birth/Sex: Treating RN: 10/16/1950 (72 y.o. [name] Primary Care [name]: [name] Other Clinician: Referring [name]: Treating  [name]/Extender: [name] in Treatment: 7 Active Inactive Nutrition Nursing Diagnoses: Potential for alteratiion in Nutrition/Potential for imbalanced nutrition Goals: Patient/caregiver agrees to and verbalizes understanding of need to obtain nutritional consultation Date Initiated: 10/11/2022 Target Resolution Date: 10/21/2022 Goal Status: Active Interventions: Provide education on elevated blood sugars and impact on wound healing Provide education on nutrition Treatment Activities: Patient referred to Primary Care Physician for further nutritional evaluation : 10/11/2022 Notes: Orientation to the Wound Care Program Nursing Diagnoses: Knowledge deficit related to the wound healing center program [name], [name] ([id number]) [phone].pdf Page 4 of 8 Goals: Patient/caregiver will verbalize understanding of the Wound Healing Center Program Date Initiated: 10/11/2022 Target Resolution Date: 10/22/2022 Goal Status: Active Interventions: Provide education on orientation to the wound center Notes: Pressure Nursing Diagnoses: Potential for impaired tissue integrity related to pressure, friction, moisture, and shear Goals: Patient will remain free from development of additional pressure ulcers Date Initiated: 10/11/2022 Target Resolution Date: 10/22/2022 Goal Status: Active Patient/caregiver will verbalize understanding of pressure ulcer management Date Initiated: 10/11/2022 Target Resolution Date: 10/22/2022 Goal Status: Active Interventions: Assess: immobility, friction, shearing, incontinence upon admission and as needed Assess offloading mechanisms upon admission and as needed Provide education on pressure ulcers Notes: Wound/Skin Impairment Nursing Diagnoses: Knowledge deficit related to ulceration/compromised skin integrity Goals: Patient/caregiver will verbalize understanding of skin care regimen Date Initiated:  10/11/2022 Target Resolution Date: 10/22/2022 Goal Status: Active Interventions: Assess patient/caregiver ability to perform ulcer/skin care regimen upon admission and as needed Assess ulceration(s) every visit Provide education on ulcer and skin care Treatment Activities: Skin care regimen initiated : 10/11/2022 Topical wound management initiated : 10/11/2022 Notes: Electronic Signature(s) Signed: 12/03/2022 10:19:19 AM By: [name] Entered By: [name] on 11/29/2022 12:56:59 -------------------------------------------------------------------------------- Negative Pressure Wound Therapy Maintenance (NPWT) Details Patient Name: Date of Service: Carmen [name] [name]. 11/29/2022 12:30 PM Medical Record Number: [id number] Patient Account Number: [id number] Date of Birth/Sex: Treating RN: 11/20/1950 (72 y.o. [name] Primary Care Annaleese Guier: [name] Other Clinician: Referring Oaklee Sunga: Treating Daelen Belvedere/Extender: [name] in Treatment: 7 NPWT Maintenance Performed for: Wound #1 Left Gluteus Performed By: Carmen Grills, RN Type: VAC System Coverage Size (sq cm): 0.09 Pressure Type: Constant [name], [name] ([id number]) [id number] Page 5 of 8 Pressure Setting: 125 mmHG Drain Type: None Primary Contact: None Sponge/Dressing Type: Foam- Black Date Initiated: 11/16/2022 Dressing Removed: Yes Quantity of Sponges/Gauze Removed: 1 Canister Changed: Yes Canister Exudate Volume: 1 Dressing Reapplied: Yes Quantity of Sponges/Gauze Inserted: 1 Days On NPWT :  14 Post Procedure Diagnosis Same as Pre-procedure Notes Scribed for Dr Mikey Bussing by Carmen Grills RN Electronic Signature(s) Signed: 12/03/2022 10:19:19 AM By: [name] Entered By: [name] on 11/29/2022 13:11:50 -------------------------------------------------------------------------------- Pain Assessment Details Patient Name: Date of  Service: Carmen [name] [name]. 11/29/2022 12:30 PM Medical Record Number: [id number] Patient Account Number: [id number] Date of Birth/Sex: Treating RN: 1950-06-30 (72 y.o. [name] Primary Care Halee Glynn: [name] Other Clinician: Referring Ival Basquez: Treating Shadee Montoya/Extender: [name] in Treatment: 7 Active Problems Location of Pain Severity and Description of Pain Patient Has Paino No Site Locations Pain Management and Medication Current Pain Management: Electronic Signature(s) Signed: 12/03/2022 10:19:19 AM By: [name] Entered By: [name] on 11/29/2022 12:56:08 [name] ([id number]) [id number] Page 6 of 8 -------------------------------------------------------------------------------- Patient/Caregiver Education Details Patient Name: Date of Service: Carmen Carmen [place], [name] 6/17/2024andnbsp12:30 PM Medical Record Number: [id number] Patient Account Number: [id number] Date of Birth/Gender: Treating RN: 1950-11-29 (72 y.o. [name] Primary Care Physician: [name] Other Clinician: Referring Physician: Treating Physician/Extender: [name] in Treatment: 7 Education Assessment Education Provided To: Patient and Caregiver Education Topics Provided Wound/Skin Impairment: Methods: Explain/Verbal Responses: State content correctly Electronic Signature(s) Signed: 12/03/2022 10:19:19 AM By: [name] Entered By: [name] on 11/29/2022 13:09:32 -------------------------------------------------------------------------------- Wound Assessment Details Patient Name: Date of Service: Carmen [name] [name]. 11/29/2022 12:30 PM Medical Record Number: [id number] Patient Account Number: [id number] Date of Birth/Sex: Treating RN: [birth year] (72 y.o. [name] Primary Care Sintia Mckissic: [name] Other Clinician: Referring Avry Monteleone: Treating  Zahmir Lalla/Extender: [name] in Treatment: 7 Wound Status Wound Number: 1 Primary Etiology: Pressure Ulcer Wound Location: Left Gluteus Wound Status: Open Wounding Event: Pressure Injury Comorbid History: Cataracts, Lymphedema, Hypotension, Quadriplegia Date Acquired: 07/15/2022 Weeks Of Treatment: 7 Clustered Wound: No Photos Wound Measurements Length: (cm) 0.3 Width: (cm) 0.3 [name], [name] ([id number]) Depth: (cm) 0.7 Area: (cm) 0.071 Volume: (cm) 0.049 % Reduction in Area: 81.2% % Reduction in Volume: 90% [id number] Page 7 of 8 Epithelialization: Small (1-33%) Tunneling: Yes Position (o'clock): 12 Maximum Distance: (cm) 2.8 Undermining: No Wound Description Classification: Category/Stage III Wound Margin: Distinct, outline attached Exudate Amount: Medium Exudate Type: Serosanguineous Exudate Color: red, brown Foul Odor After Cleansing: No Slough/Fibrino Yes Wound Bed Granulation Amount: Medium (34-66%) Exposed Structure Granulation Quality: Red, Pink Fascia Exposed: No Necrotic Amount: Medium (34-66%) Fat Layer (Subcutaneous Tissue) Exposed: Yes Necrotic Quality: Adherent Slough Tendon Exposed: No Muscle Exposed: No Joint Exposed: No Bone Exposed: No Periwound Skin Texture Texture Color No Abnormalities Noted: No No Abnormalities Noted: No Callus: No Atrophie Blanche: No Crepitus: No Cyanosis: No Excoriation: No Ecchymosis: No Induration: No Erythema: No Rash: No Hemosiderin Staining: No Scarring: No Mottled: No Pallor: No Moisture Rubor: No No Abnormalities Noted: No Dry / Scaly: No Maceration: No Treatment Notes Wound #1 (Gluteus) Wound Laterality: Left Cleanser Vashe 5.8 (oz) Discharge Instruction: Cleanse the wound with Vashe prior to applying a clean dressing using gauze sponges, not tissue or cotton balls. Peri-Wound Care Topical Primary Dressing Hydrofera Blue Classic Foam  Rope Dressing, 9x6 (mm/in) Discharge Instruction: Moisten with saline prior to packing Secondary Dressing Bordered Gauze, 4x4 in Discharge Instruction: Apply over primary dressing as directed. Secured With 81M Medipore H Soft Cloth Surgical T ape, 4 x 10 (in/yd) Discharge Instruction: Secure with tape as directed. Compression Wrap Compression Stockings Add-Ons Electronic Signature(s) Signed: 12/03/2022 10:19:19 AM By: [name] Entered By: Pincus Large,  Lupita Leash on 11/29/2022 13:05:36 [name] ([id number]) [id number] Page 8 of 8 -------------------------------------------------------------------------------- Vitals Details Patient Name: Date of Service: Carmen [name] [name]. 11/29/2022 12:30 PM Medical Record Number: [id number] Patient Account Number: [id number] Date of Birth/Sex: Treating RN: [birth year] (72 y.o. [name] Primary Care Julita Ozbun: [name] Other Clinician: Referring Dynver Clemson: Treating Adisa Vigeant/Extender: [name] in Treatment: 7 Vital Signs Time Taken: 12:55 Temperature ([name]): 99.1 Height (in): 64 Pulse (bpm): 86 Weight (lbs): 115 Respiratory Rate (breaths/min): 18 Body Mass Index (BMI): 19.7 Blood Pressure (mmHg): 84/49 Reference Range: 80 - 120 mg / dl Electronic Signature(s) Signed: 12/03/2022 10:19:19 AM By: [name] Entered By: [name] on 11/29/2022 12:55:47
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
## 2022-12-06 ENCOUNTER — Encounter (HOSPITAL_BASED_OUTPATIENT_CLINIC_OR_DEPARTMENT_OTHER): Payer: Medicare PPO | Admitting: Internal Medicine

## 2022-12-06 ENCOUNTER — Ambulatory Visit: Payer: Medicare PPO | Admitting: Occupational Therapy

## 2022-12-06 ENCOUNTER — Ambulatory Visit: Payer: Medicare PPO | Admitting: Physical Therapy

## 2022-12-06 ENCOUNTER — Encounter: Payer: Self-pay | Admitting: Physical Therapy

## 2022-12-06 DIAGNOSIS — R29818 Other symptoms and signs involving the nervous system: Secondary | ICD-10-CM | POA: Diagnosis not present

## 2022-12-06 DIAGNOSIS — G8250 Quadriplegia, unspecified: Secondary | ICD-10-CM

## 2022-12-06 DIAGNOSIS — T798XXA Other early complications of trauma, initial encounter: Secondary | ICD-10-CM

## 2022-12-06 DIAGNOSIS — R29898 Other symptoms and signs involving the musculoskeletal system: Secondary | ICD-10-CM

## 2022-12-06 DIAGNOSIS — M24542 Contracture, left hand: Secondary | ICD-10-CM

## 2022-12-06 DIAGNOSIS — R278 Other lack of coordination: Secondary | ICD-10-CM

## 2022-12-06 DIAGNOSIS — R293 Abnormal posture: Secondary | ICD-10-CM

## 2022-12-06 DIAGNOSIS — G8253 Quadriplegia, C5-C7 complete: Secondary | ICD-10-CM

## 2022-12-06 DIAGNOSIS — L89323 Pressure ulcer of left buttock, stage 3: Secondary | ICD-10-CM

## 2022-12-06 DIAGNOSIS — M6281 Muscle weakness (generalized): Secondary | ICD-10-CM

## 2022-12-06 DIAGNOSIS — M24541 Contracture, right hand: Secondary | ICD-10-CM | POA: Diagnosis not present

## 2022-12-06 DIAGNOSIS — R2689 Other abnormalities of gait and mobility: Secondary | ICD-10-CM | POA: Diagnosis not present

## 2022-12-06 DIAGNOSIS — G8254 Quadriplegia, C5-C7 incomplete: Secondary | ICD-10-CM | POA: Diagnosis not present

## 2022-12-06 DIAGNOSIS — R208 Other disturbances of skin sensation: Secondary | ICD-10-CM

## 2022-12-06 NOTE — Therapy (Signed)
OUTPATIENT OCCUPATIONAL THERAPY NEURO PROGRESS AND TREATMENT NOTE Patient Name: [name] MRN: [id number] DOB:12/24/1950, 72 y.o., female Today's Date: 12/06/2022  PCP: [name], MD  REFERRING PROVIDER: [name], MD  END OF SESSION:  OT End of Session - 12/06/22 1019     Visit Number 16    Number of Visits 25    Date for OT Re-Evaluation 01/07/23    Authorization Type [employer] - requires auth, [place] (additional auth requested)    Authorization Time Period --    Authorization - Visit Number 15    Authorization - Number of Visits 17    Progress Note Due on Visit 26    OT Start Time 1021    OT Stop Time 1100    OT Time Calculation (min) 39 min    Activity Tolerance Patient tolerated treatment well    Behavior During Therapy [facility] for tasks assessed/performed             Past Medical History:  Diagnosis Date   CERVICAL POLYP 03/11/2008   Qualifier: Diagnosis of  By: [name] MD, [name]    Colon polyps 2005   on colonscopy [name]   Fibroid 2004   Per Dr. [name]   History of shingles    face and mouth   Hx of skin cancer, basal cell    Rosacea    Sciatica of left side 09/28/2013   Scoliosis    noted on mri done for back pain   Past Surgical History:  Procedure Laterality Date   BUNIONECTOMY     Patient Active Problem List   Diagnosis Date Noted   Buttock wound, left, initial encounter 11/02/2022   Orthostatic hypotension 08/13/2022   Neurogenic bowel 05/03/2022   Spasticity 05/03/2022   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/03/2022   Nerve pain 05/03/2022   Medication monitoring encounter 01/08/2022   Neurogenic bladder 10/11/2021   Urinary incontinence 10/11/2021   ESBL (extended spectrum beta-lactamase) producing bacteria infection 10/09/2021   Recurrent UTI 10/09/2021   Quadriplegia, C5-C7 incomplete (HCC) 01/16/2021   History of spinal fracture 01/16/2021   Suprapubic catheter (HCC) 01/16/2021   Encounter for routine gynecological  examination 09/28/2013   Onychomycosis 09/28/2013   Foot deformity, acquired 03/26/2012   Encounter for preventive health examination 12/25/2010   ROSACEA 08/25/2009   Disturbance in sleep behavior 03/11/2008   SKIN CANCER, HX OF 03/11/2008   DYSURIA, HX OF 03/11/2008   Hyperlipidemia 02/10/2007   CERVICALGIA 02/10/2007    ONSET DATE: 07/28/2020  Date of Referral 09/28/22  REFERRING DIAG: G82.54 (ICD-10-CM) - Quadriplegia, C5-C7, incomplete (HCC)  THERAPY DIAG:  Muscle weakness (generalized)  Other lack of coordination  Quadriplegia, C5-C7 incomplete (HCC)  Other symptoms and signs involving the nervous system  Contracture of hand joint, left  Contracture of hand joint, right  Quadriplegia, C5-C7 complete (HCC)  Other disturbances of skin sensation  Other symptoms and signs involving the musculoskeletal system  Rationale for Evaluation and Treatment: Rehabilitation  SUBJECTIVE:   SUBJECTIVE STATEMENT:   Pt reports she is able to lift her bottom up off of her wheelchair using BUE now. She has not been able to cut food at home. She does not have adaptive eating utensils.   Pt accompanied by:  Live in Caregiver - [name]   PERTINENT HISTORY: "Pt is a 72 yr old L handed female with hx of incomplete quadriplegia- 2/14 2022- fleeing the police in [place] on passenger 100 (high speed) miles/hour,  [name]; neurogenic bowel and  bladder and spasticity; [name], [name] Here for f/u on Incomplete quadriplegia"  Referral from MD 09/28/22 states, "Please eval and treat for ADLs and higher level mobility."  PRECAUTIONS: Fall; suprapubic catheter (she wants to get this removed meaning she needs to get to and from the toilet); she has had minor heat sensation when needing to complete her bowel program-possible AD?   WEIGHT BEARING RESTRICTIONS: No-pt was in stander at most recent therapy in [place] last week.  She will have a bone density done soon w/ Dr. [name].    PAIN:  Patient reports chronic pain 3/10 fingers to elbow bilaterally managed by medication and will inform therapy staff of any changes.  FALLS: Has patient fallen in last 6 months? No  LIVING ENVIRONMENT: Lives with: lives with their family - husband [name] and with an adult companion s/p moving back up from [place] x10 months Lives in: House/apartment Stairs:  4 story town house with an Engineer, structural with threshold adjustments, roll in shower with transport chair Has following equipment at home: Wheelchair (power) - with seat height adjustments to access counters and reclining option, Wheelchair (manual), transport WC, shower chair, and Ramped entry, handheld showerhead with rails around toilet, had Michiel Sites but is no longer in need of it, has slide boards x3  PLOF: Requires assistive device for independence, Needs assistance with ADLs, Needs assistance with homemaking, Needs assistance with gait, and Needs assistance with transfers; 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  Used to like to knit, sew and bake.  PATIENT GOALS: Wants to be able to type - currently using advanced Dragon dictation at times but prefers to type at times.  She would like to be able to write better and is interested in resuming some leisure activities such as Archivist and baking.  She also wants to keep working on being able to cut her own food and on her UE strength.   OBJECTIVE:   HAND DOMINANCE: Left  ADLs: Overall ADLs: Patient has a live in caregiver  Transfers/ambulation related to ADLs: Mod assist with sliding board transfers (previously [employer] lift).  Eating: Has a rocker knife that she can use. Used to use adapted utensils but now uses regular utensils but still will get assistance to cut food ie) when eating out.  Grooming: can brush her own hair but unable to manage jewelry ie) earrings  UB Dressing: can zip/unzip after it has been started, unable to manage buttons herself, Caregiver assists  but if she has extra time, she can put on her bra, and a loose fitting pullover shirt/t-shirt  LB Dressing: dependent for LB dressing in bed and with special sock donner for LE compression garments   Toileting: bladder trained with suprapubic catheter which she clamps off.  Dependent for bowel incontinence care.  Bathing: Sponge bath with adult washclothes.  Can bath UB with back scrubber for most of her back.  Needs help with feet (mentioned she might need a separate brush for feet)   Tub Shower transfers: Min-mod assist with slide board  Equipment: Shower seat with back, Walk in shower, bed side commode, Reacher, Sock aid, Long handled sponge, and Feeding equipment  IADLs: --  Shopping: Assisted by caregiver  Light housekeeping: Has housekeeper that comes monthly  Meal Prep: previously enjoyed baking. Assisted by caregiver but described recent success at reheating a meal for herself after getting food out of the fridge/freezer from her WC.  Community mobility: Dependent  Medication management: Caregiver  sorts them into pillbox but she is very aware of her medications   Financial management: Patient manages her own finances  Handwriting: Increased time and has a pen with a little grip  MOBILITY STATUS: Independent with power mobiity  POSTURE COMMENTS:  No Significant postural limitations and forward head Sitting balance: Supports self independently with both Ues  ACTIVITY TOLERANCE: Activity tolerance: Fair - MMT WFL but has limited sustained tolerance for ongoing use of UEs  FUNCTIONAL OUTCOME MEASURES: 10/20/22 QuickDash 31.8 points   UPPER EXTREMITY ROM:   AROM - WFL without obvious contractures, some digital flexion noted but PROM WNL   AROM Right (eval) Left (eval)  Shoulder flexion [facility]  Shoulder abduction [facility]  Elbow flexion [facility] WFL  Elbow extension [facility]  Wrist flexion [facility] WFL  Wrist extension WFL WFL  Ulnar deviation WFL Decreased ulnar  deviation  past midline  Wrist pronation [facility] WFL  Wrist supination [facility]  Digit Composite Flexion Lacks full AROM:   1st digit - 5 cm   3rd digit -1 cm   4th digit - 2 cm  Lacks full AROM:   1st digit - 1.5 cm  5th digit - 3 cm   Digit Composite Extension [facility]  Digit Opposition Opposition to index finger only Lacks to pinkie due to limited DIP pinkie flexion  (Blank rows = not tested)  UPPER EXTREMITY MMT:   Grossly WFL - Endurance limited R tricep strength > than L but L UE generally stronger than R UE  MMT Right (eval) Left (eval)  Shoulder flexion 4/5 4/5  Shoulder abduction 4/5 4/5  Elbow flexion 4/5 4/5  Elbow extension 4/5 4-/5  (Blank rows = not tested)  HAND FUNCTION: Grip strength: Right: 4.8 lbs; Left: 20.9 lbs 11/17/22 Right - 7.2 lbs  COORDINATION: Finger Nose Finger test: R generally WFL, L WNL Box and Blocks:  Right 28 blocks, Left 37 blocks R hand finger eventually cramps and dexterity worsens in the cold  SENSATION: Light touch: Impaired  - patient   EDEMA: NA for UEs but LE has poor lymph drainage with custom compression garments   MUSCLE TONE: Generally WFL but will assess further  COGNITION: Overall cognitive status: Within functional limits for tasks assessed  VISION: Subjective report: Patent wears progressive lens/glasses.  Denies diplopia or vision changes but has eye exam in the next couple of months. Baseline vision: Wears glasses all the time  VISION ASSESSMENT: WFL  EVALUATION OBSERVATIONS: Patient independent with power WC navigation within clinic.  Patient is well-kept with foley catheter in place.  She has slight limitations in full extension of digits but PROM is WNL and she has splints at home that she said she will bring for OT staff to assess.  She has functional ROM of B UEs to reach her head, behind her back and to cross midline to assist with ADLs.     TODAY'S TREATMENT:                                                                                                                                  -  Self-care/home management completed for duration as noted below including: OT educated pt on use of adaptive eating utensils including use of foam tubing/built up handles, rocker knife, rolling pizza cutter wheel, and mezzaluna (one handle) style cutter of medium size for more application and use. She trialed use of various adapted utensils with use of red theraputty to simulate self feeding. PATIENT EDUCATION: Education details: eating utensils/cutting person educated: Patient and Caregiver Live in caregiver - [name] Education method: Explanation, Demonstration, Tactile cues, and Verbal cues,  Education comprehension: verbalized understanding, returned demonstration, and needs further education   HOME EXERCISE PROGRAM: 11/25/2022: NMES use  12/02/22: All previous HEPs combined to 1 complete List through MedBridge Access Code: [email or web address] URL: [email or web address] Date: 12/02/2022 Prepared by: Amada Kingfisher   GOALS:   SHORT TERM GOALS: Target date: 11/09/22  Patient will be able to use AE/modified techniques to cut soft foods small enough for oral intake. Baseline: Caregiver/spouse assist Goal status: IN PROGRESS  2.  Patient will be able to use AE/modified positioning to complete word search by drawing lines through words with 0 errors. Baseline: TBD Goal status: NOT MET - Goal discontinued as patient focusing on writing more than word search ie) note taking etc.  3.  Patient will verbalize understanding of good pressure relief schedule (for 15 to 60 seconds every 15 to 60 minutes) to help with wound healing. Baseline: Patient did not change positioning in > 45 minutes of OT eval. Goal status: MET  4.  Patient will be assisted to explore modifications for leisure tasks (knit/sew/bake) with good return demonstration. Baseline: Minimal involvement Goal status: IN PROGRESS  11/17/22 - trialled knitting   12/02/22 - continued AE exploration  5.  Patient will demonstrate independence with HEP for UE strengthening, coordination and ROM to prevent contractures and maintain strength for transfers and ADLs. Baseline: Previous HEPs have been established but need to be reviewed and updated.  Goal status: IN PROGRESS  11/17/22 working on lists and categorizing activities 12/02/22 Comprehensive [employer] list compiled  6.  Patient will be assessed for typing speed/dexterity. Baseline: Patient reports difficulty with typing with all her fingers. Goal status: IN PROGRESS  7. Patient will complete Quick Dash UE assessment. Baseline: TBD Goal Status: 10/20/22 Discontinued - see scores above   LONG TERM GOALS: Target date: 01/07/23  Patient will complete 1 small craft/week r/t her leisure interests to work on FMS daily. Baseline: Minimal involvement Goal status: IN PROGRESS  2.  Patient will improve B coordination for increased typing speed/dexterity x 1-2 WPM. Baseline: TBD Goal status: IN PROGRESS  11/17/22 daily engagement with typing at home although modified use of digits for speed  3.  Patient will improve B UE coordination, strength and functional use to bake cookies with setup assistance and AE/modified techniques as appropriate.  Baseline: Not performed. Goal status: IN PROGRESS  4.  Patient will report no more than moderate difficulty using a knife to foods such as chicken, small enough for oral intake using AE and strategies as needed. Baseline: Caregiver/spouse assist Goal status: IN PROGRESS  5.  Patient will be able to use AE/modified positioning to complete 4 sentences with 100% legibility. Baseline: Subjective reports of difficulty with writing Goal status: IN PROGRESS   ASSESSMENT:  CLINICAL IMPRESSION:   This 16th progress note is for dates: 11/17/2022 to 12/07/2022.  Pt has met 1/6 STGs with progress in all areas and is progressing in 5/5 LTGs as well. Pt making progress  towards goals as expected and continues  to benefit from skilled OT services in the outpatient setting to work towards remaining goals or until max rehab potential is met. Pt continues to benefit from skilled OT services for adaptions as needed for more safe and independent ADL and IADL completion. Pt will require follow up with DME recommendations to assure proper use and maximize benefit of use.   PERFORMANCE DEFICITS: in functional skills including ADLs, IADLs, coordination, dexterity, strength, muscle spasms, Fine motor control, Gross motor control, continence, skin integrity, and UE functional use,   IMPAIRMENTS: are limiting patient from ADLs, IADLs, work, and leisure.   CO-MORBIDITIES: has co-morbidities such as incontinence and wound  that affects occupational performance. Patient will benefit from skilled OT to address above impairments and improve overall function.  REHAB POTENTIAL: Fair due to chronicity of injury   PLAN:  OT FREQUENCY: 2x/week  OT DURATION: additional 4 weeks  PLANNED INTERVENTIONS: self care/ADL training, therapeutic exercise, therapeutic activity, neuromuscular re-education, manual therapy, passive range of motion, balance training, functional mobility training, splinting, patient/family education, energy conservation, coping strategies training, and DME and/or AE instructions  RECOMMENDED OTHER SERVICES: None at this time  CONSULTED AND AGREED WITH PLAN OF CARE: Patient and family member/caregiver  PLAN FOR NEXT SESSION: pizza wheel cutter   Functional kitchen activities.  Review knitting modifications introduced if patient brings activity from home.  Review positioning of R hand to help with finger opposition and isolation of finger joints.  Continue FM tasks to improve typing shoes and progressing simple hobby/craft activities (ie baking, yarn activities & baking).   Delana Meyer, OT 12/06/2022, 2:14 PM

## 2022-12-06 NOTE — Therapy (Signed)
OUTPATIENT PHYSICAL THERAPY NEURO TREATMENT   Patient Name: [name] MRN: [id number] DOB:Dec 22, 1950, 72 y.o., female Today's Date: 12/06/2022   PCP: [name], MD REFERRING PROVIDER: [name], MD   END OF SESSION:  12/06/22 0932  PT Visits / Re-Eval  Visit Number 16  Number of Visits 17 (16 + eval)  Date for PT Re-Evaluation 12/10/22  Authorization  Authorization Type [employer] MEDICARE  Progress Note Due on Visit 20  PT Time Calculation  PT Start Time 0932  PT Stop Time 1014  PT Time Calculation (min) 42 min  PT - End of Session  Equipment Utilized During Treatment Gait belt  Activity Tolerance Patient tolerated treatment well  Behavior During Therapy WFL for tasks assessed/performed      Past Medical History:  Diagnosis Date   CERVICAL POLYP 03/11/2008   Qualifier: Diagnosis of  By: [name] MD, [name]    Colon polyps 2005   on colonscopy [name]   Fibroid 2004   Per Dr. [name]   History of shingles    face and mouth   Hx of skin cancer, basal cell    Rosacea    Sciatica of left side 09/28/2013   Scoliosis    noted on mri done for back pain   Past Surgical History:  Procedure Laterality Date   BUNIONECTOMY     Patient Active Problem List   Diagnosis Date Noted   Buttock wound, left, initial encounter 11/02/2022   Orthostatic hypotension 08/13/2022   Neurogenic bowel 05/03/2022   Spasticity 05/03/2022   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/03/2022   Nerve pain 05/03/2022   Medication monitoring encounter 01/08/2022   Neurogenic bladder 10/11/2021   Urinary incontinence 10/11/2021   ESBL (extended spectrum beta-lactamase) producing bacteria infection 10/09/2021   Recurrent UTI 10/09/2021   Quadriplegia, C5-C7 incomplete (HCC) 01/16/2021   History of spinal fracture 01/16/2021   Suprapubic catheter (HCC) 01/16/2021   Encounter for routine gynecological examination 09/28/2013   Onychomycosis 09/28/2013   Foot deformity, acquired 03/26/2012    Encounter for preventive health examination 12/25/2010   ROSACEA 08/25/2009   Disturbance in sleep behavior 03/11/2008   SKIN CANCER, HX OF 03/11/2008   DYSURIA, HX OF 03/11/2008   Hyperlipidemia 02/10/2007   CERVICALGIA 02/10/2007    ONSET DATE: 09/28/2022 (most recent referral)  REFERRING DIAG: G82.54 (ICD-10-CM) - Quadriplegia, C5-C7, incomplete (HCC)  THERAPY DIAG:  Muscle weakness (generalized)  Other lack of coordination  Abnormal posture  Other abnormalities of gait and mobility  Rationale for Evaluation and Treatment: Rehabilitation  SUBJECTIVE:  SUBJECTIVE STATEMENT: Pt denies acute changes.  She is still waiting to have PWC speed adjusted to be slower.  She states they bought a partial noodle to protect her bottom on the lip of the airplane door which sits up roughly 3 feet off the ground.  They are thinking of using a lazy susan style turntable device to transfer pt via stand pivot w/ caregiver.  The device is Vive patient turner and has knee guards and a handle at the top.  She is traveling in a [employer] a[employer] airplane this Wednesday and request suggestions for transitioning safely into and out of the plane.  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 Yes: NPRS scale: 3/10 Pain location: forearms to fingertips Pain description: constant, pinprick/tingling Aggravating factors: nighttime Relieving factors: nothing, sometimes medicines  PRECAUTIONS: Fall; suprapubic catheter (she wants to get this removed meaning she needs to get to and from the toilet); she has had minor heat sensation when needing to complete her bowel program-possible AD?  WEIGHT  BEARING RESTRICTIONS: No-pt was in stander at most recent therapy in [place] last week.  She will have a bone density done soon w/ Dr. [name].  FALLS: Has patient fallen in last 6 months? No  LIVING ENVIRONMENT: Lives with: lives with their spouse and live-in nurse [name] Lives in: House/apartment-townhouse Stairs: No-level entry, but multi-level home 4 stories w/ elevator Has following equipment at home: Wheelchair (power), Wheelchair (manual), Grab bars, and standing frame, sliding board, transport shower chair, handheld shower head, leg strap-pt reports she no longer finds this helpful  PLOF: Requires assistive device for independence, Needs assistance with ADLs, Needs assistance with homemaking, and Needs assistance with transfers  OCCUPATION:  Writer-uses Dragon to dictate  PATIENT GOALS: "Make my right leg work."  Stand and pivot so she can more easily access a toilet.  OBJECTIVE:   DIAGNOSTIC FINDINGS: No recent relevant imaging.  Bone density scheduled 10/15/2022.  COGNITION: Overall cognitive status: Within functional limits for tasks assessed   SENSATION: Light touch: Diminished ability to distinguish sharp and dull, but able to distinguish light touch from injury level down accurately  COORDINATION: Not formally assessed.  EDEMA:  Well managed w/ lymphatic massage and compression stockings.  MUSCLE TONE: Pt has intermittent clonus during transfers.  POSTURE: rounded shoulders, posterior pelvic tilt, and right thoracic scoliosis   LOWER EXTREMITY ROM:     Passive  Right 10/20/22 Left 10/20/22  Hip flexion [facility]  Hip extension    Hip abduction    Hip adduction    Hip internal rotation [facility]  Hip external rotation [facility]  Knee flexion [facility]  Knee extension [facility]  Ankle dorsiflexion Slight PF contracture Slight PF contracture  Ankle plantarflexion    Ankle inversion    Ankle eversion     (Blank rows = not tested)    LOWER EXTREMITY MMT:    MMT  Right 10/20/22 Left 10/20/22  Hip flexion 1 2+  Hip extension    Hip abduction    Hip adduction    Hip internal rotation    Hip external rotation    Knee flexion 2- 3  Knee extension 2- 3  Ankle dorsiflexion 0 3  Ankle plantarflexion    Ankle inversion    Ankle eversion     (Blank rows = not tested)   BED MOBILITY:  Sit to supine Mod A Supine to sit Mod A Rolling to Right Mod A Rolling to Left Mod A Undulating mattress for wound management on standard bed (elevated-so often doing uphill sliding board transfers); she would like to continue working on sitting up independently, she has been working on rolling, needs less assistance w/ this when someone props her leg into hooklying; would like something to help her pull her left leg to her butt for stretching as well as bed mobility.  FUNCTIONAL TESTS:  None relevant to pt's current functional level and abilities.  PATIENT SURVEYS:  None completed due to time.  TODAY'S TREATMENT:        TherAct/NMR: Patient and caregiver [name] received from lobby with patient in [facility].  They present needing to problem solve transfer on and off of a [employer] a36 private plane as patient has to travel roughly 2 days after visit today.  Upon looking up pictures with patient and caregiver verification (they did not have pictures of her personal plane) patient will have to transfer roughly 2-3 feet into a wide double door opening into the rear of the plane where one set of seats faces the front and then there is a rear facing set of seats.  The front facing seats are removable which is recommended for safest transfer.  Patient is planning to use a half cut pool noodle over threshold to protect bottom for partial transfer.  Patient is likely to have to use a draw sheet with +2 to lift into floorboard of plane and then into the rear facing seats.  She does confirm that husband and additional person will be available to help with this.  The wing is non-weight bearing  and there is a step pedal that sticks down and out directly in the middle of the opening preventing further seated independence transferring into the plane aside from the height of the entrance.  Caregiver emphasizes a stand pivot would be helpful with PT affirming that patient is not safe for that type of transfer at this time.  Upon discussing progression towards practice of this transfer patient shows PT Vive patient turner picture.  She is debating purchasing this device, PT affirms this could be safe and beneficial based on prior performance using Stedy stander and progress with core control.  Did encourage patient to have caregiver bring this device to PT if able and if they decide to purchase so PT can work with and promote safe transfers with this.  Discussed plan for future re-cert and using future visits to progress to standing in parallel bars with knee immobilizers vs other techniques to further address personal and objective goals.  Patient and caregiver requesting to spend remainder of visit stretching vs assessing goals or standing in stander due to concern for patient fatigue with ramp up in activity at home in preparation for trip.  PT provides modA to transfer to prone for passive stretching into hip IR/ER, quad stretch w/ prevention of pelvic rocking off mat table.  Left hip flexor tighter than right today.  Repeated prone hip flexor stretch x3 each side.  Pt returned to upright modA and returns to [facility] via sliding board w/ minA for LE adjustment.  Handoff to OT.  PATIENT EDUCATION: Education details:  See above. Person educated: Patient and Arts administrator Education method: Explanation Education comprehension: verbalized understanding  HOME EXERCISE PROGRAM: Will be established as needed as pt has done continuous therapy and is working towards functional tasks.  GOALS: Goals reviewed with  patient? Yes  SHORT TERM GOALS: Target date: 11/12/2022  HEP to be established for stretching  and strengthening as needed. Baseline:  Pt has established home routine and adequate caregiver assistance at this time (5/30)  Goal status: IN PROGRESS  2.  Pt will be able to perform rolling L and R w/ no more than minA in order to improve functional mobility. Baseline: modA, min A to setup LE into hookyling (5/28) Goal status: MET  3.  Pt will perform sit<>supine requiring no more than minA in order to improve functional mobility. Baseline: modA, mod A (5/28) Goal status: IN PROGRESS  4.  Pt will perform bilateral reach outside seated BOS with feet supported without LOB in order to improve safety with ADL performance. Baseline: lateral LOB bilaterally demonstrated in w/c; pt able to reach 2-3 inches outside BOS w/ moderate wobble (R worse than L - 5/30) Goal status: MET  LONG TERM GOALS: Target date: 12/10/2022  Pt will perform squat pivot transfer w/ no more than modA in order to improve access to home environment for toilet transfers. Baseline: minA for lateral scoot transfer, pt able to clear bottom intermittently Goal status: INITIAL  2.  Pt will perform sit<>supine requiring no more than supervision in order to improve transfers in and out of bed. Baseline:  modA Goal status: INITIAL  3.  Pt will report wound healing in order to return to day program at [facility]. Baseline:  Ischial wound Goal status: INITIAL  4.  Pt will be able to perform rolling L and R w/ no more than supervision in order to improve functional mobility. Baseline: modA Goal status: INITIAL  5.  Pt will perform uphill sliding board transfer w/ no more than modA in order to improve transfer in home environment. Baseline:  To be assessed. Goal status: INITIAL  6.  Pt will perform partial STS in stander or from elevated power w/c to counter w/ no more than modA in order to improve capacity for higher level transfers and access to uneven surfaces. Baseline: To be assessed. Goal status:  INITIAL  ASSESSMENT:  CLINICAL IMPRESSION: Unable to complete re-cert this visit due to patient needing to conserve energy for transfers and tasks at home per caregiver.  Time spent assessing and problem solving patient access to recently purchased personal plane for quickly upcoming travel this week.  Patient will likely have to use draw sheet for boost x2 for this transfer until PT can further address stand to Vive patient turner and eventually using just a stand pivot with modifications as able.  Remainder of time spent in prone with PT providing stretching to patient for pain management and muscular balance.  Will assess LTGs at next visit for re-cert.  OBJECTIVE IMPAIRMENTS: decreased balance, decreased coordination, decreased mobility, difficulty walking, decreased ROM, decreased strength, hypomobility, increased edema, impaired flexibility, impaired sensation, impaired tone, impaired UE functional use, improper body mechanics, postural dysfunction, and pain.   ACTIVITY LIMITATIONS: carrying, lifting, bending, sitting, standing, squatting, stairs, transfers, bed mobility, continence, bathing, toileting, dressing, reach over head, hygiene/grooming, locomotion level, and caring for others  PARTICIPATION LIMITATIONS: meal prep, cleaning, laundry, driving, and community activity  PERSONAL FACTORS: Age, Fitness, Past/current experiences, Time since onset of injury/illness/exacerbation, and 1-2 comorbidities: intermittent AD, neurogenic bowel/bladder on active bladder training  are also affecting patient's functional outcome.   REHAB POTENTIAL: Good  CLINICAL DECISION MAKING: Evolving/moderate complexity  EVALUATION COMPLEXITY: Moderate  PLAN:  PT FREQUENCY: 2x/week  PT DURATION: 8 weeks  PLANNED  INTERVENTIONS: Therapeutic exercises, Therapeutic activity, Neuromuscular re-education, Balance training, Patient/Family education, Self Care, DME instructions, Electrical stimulation, Wheelchair  mobility training, Manual therapy, and Re-evaluation  PLAN FOR NEXT SESSION:  ASSESS LTGS-re-cert for 2x/wk for 4 weeks at a time-[employer] FORM!  NMES to LE, leg puller for bed mobility, Further assess and address multilevel slide board transfers.  Education on pressure relief for wound healing.  Core strength, static sitting balance-perturbations.  Bed mobility.  Use stander to practice weight-bearing and core as tolerated--monitor BP due to history of OH, can work towards decreased sling support in standing, use of powderboard on mat table for RLE strengthening, wants to work on increasing independence with lateral scoot transfers without SB and also wants to be able to place SB independently and move her LE more independently; use of leg loops/lifters to increase independence with LE management with transfers and bed mobility, standing in // bars with knees blocked or knee immobilizers to assess standing without stedy, did she purchase Vive patient turner?  Sadie Haber, PT, DPT  12/06/2022, 9:33 AM
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
## 2022-12-07 NOTE — Progress Notes (Signed)
[name], [name] [id number] Page 1 of 8 Visit Report for 12/06/2022 Arrival Information Details Patient Name: Date of Service: [name], [place] [name]. 12/06/2022 12:30 PM Medical Record Number: [id number] Patient Account Number: [id number] Date of Birth/Sex: Treating RN: 03-Jan-1951 (72 y.o. [name] Primary Care [name]: [name] Other Clinician: Referring [name]: Treating [name]/Extender: [name] in Treatment: 8 Visit Information History Since Last Visit All ordered tests and consults were completed: Yes Patient Arrived: Wheel Chair Added or deleted any medications: No Arrival Time: 12:35 Any new allergies or adverse reactions: No Accompanied By: nurse Had a fall or experienced change in No Transfer Assistance: Transfer Board activities of daily living that may affect Patient Identification Verified: Yes risk of falls: Secondary Verification Process Completed: Yes Signs or symptoms of abuse/neglect since last visito No Patient Requires Transmission-Based Precautions: No Hospitalized since last visit: No Patient Has Alerts: No Implantable device outside of the clinic excluding No cellular tissue based products placed in the center since last visit: Has Dressing in Place as Prescribed: Yes Pain Present Now: No Electronic Signature(s) Signed: 12/07/2022 4:52:02 PM By: [name] Entered By: [name] on 12/06/2022 12:36:21 -------------------------------------------------------------------------------- Encounter Discharge Information Details Patient Name: Date of Service: [name], [name]. 12/06/2022 12:30 PM Medical Record Number: [id number] Patient Account Number: [id number] Date of Birth/Sex: Treating RN: 06-21-1950 (72 y.o. [name] Primary Care Emilea Goga: [name] Other Clinician: Referring Belma Dyches: Treating Ken Bonn/Extender: [name] in Treatment: 8 Encounter Discharge Information Items Discharge Condition: Stable Ambulatory Status: Wheelchair Discharge Destination: Home Transportation: Private Auto Accompanied By: nurse Schedule Follow-up Appointment: Yes Clinical Summary of Care: Patient Declined Electronic Signature(s) Signed: 12/07/2022 4:52:02 PM By: [name] Entered By: [name] on 12/06/2022 13:28:48 [name] ([id number]) [id number] Page 2 of 8 -------------------------------------------------------------------------------- Lower Extremity Assessment Details Patient Name: Date of Service: [name], [name]. 12/06/2022 12:30 PM Medical Record Number: [id number] Patient Account Number: [id number] Date of Birth/Sex: Treating RN: 1951/05/05 (72 y.o. [name] Primary Care [name]: [name] Other Clinician: Referring [name]: Treating [name]/Extender: [name] in Treatment: 8 Electronic Signature(s) Signed: 12/07/2022 4:52:02 PM By: [name] Entered By: [name] on 12/06/2022 12:37:05 -------------------------------------------------------------------------------- Multi Wound Chart Details Patient Name: Date of Service: [name], [name]. 12/06/2022 12:30 PM Medical Record Number: [id number] Patient Account Number: [id number] Date of Birth/Sex: Treating RN: [birth year] (72 y.o. [name]) Primary Care [name]: [name] Other Clinician: Referring [name]: Treating [name]/Extender: [name] in Treatment: 8 Vital Signs Height(in): 64 Pulse(bpm): 86 Weight(lbs): 115 Blood Pressure(mmHg): 105/63 Body Mass Index(BMI): 19.7 Temperature([name]): 98.6 Respiratory Rate(breaths/min): 18 [1:Photos:] [[name]/A:[name]/A] Left Gluteus [name]/A [name]/A Wound Location: Pressure Injury [name]/A [name]/A Wounding Event: Pressure Ulcer [name]/A [name]/A Primary Etiology: Cataracts, Lymphedema, [name]/A  [name]/A Comorbid History: Hypotension, Quadriplegia 07/15/2022 [name]/A [name]/A Date Acquired: 8 [name]/A [name]/A Weeks of Treatment: Open [name]/A [name]/A Wound Status: No [name]/A [name]/A Wound Recurrence: 0.4x0.5x0.6 [name]/A [name]/A Measurements L x W x D (cm) 0.157 [name]/A [name]/A A (cm) : rea 0.094 [name]/A [name]/A Volume (cm) : 58.40% [name]/A [name]/A % Reduction in A rea: 80.80% [name]/A [name]/A % Reduction in Volume: 12 Position 1 (o'clock): 2.3 Maximum Distance 1 (cm): Yes [name]/A [name]/A Tunneling: Category/Stage III [name]/A [name]/A Classification: Medium [name]/A [name]/A Exudate A mount: Serosanguineous [name]/A [name]/A Exudate Type: red, brown [name]/A [name]/A Exudate Color: Distinct, outline attached [name]/A [name]/A Wound MarginBETZABETH, [name] ([id number]) [id number]  Page 3 of 8 Medium (34-66%) [name]/A [name]/A Granulation Amount: Red, Pink [name]/A [name]/A Granulation Quality: Medium (34-66%) [name]/A [name]/A Necrotic Amount: Fat Layer (Subcutaneous Tissue): Yes [name]/A [name]/A Exposed Structures: Fascia: No Tendon: No Muscle: No Joint: No Bone: No Small (1-33%) [name]/A [name]/A Epithelialization: Excoriation: No [name]/A [name]/A Periwound Skin Texture: Induration: No Callus: No Crepitus: No Rash: No Scarring: No Maceration: No [name]/A [name]/A Periwound Skin Moisture: Dry/Scaly: No Atrophie Blanche: No [name]/A [name]/A Periwound Skin Color: Cyanosis: No Ecchymosis: No Erythema: No Hemosiderin Staining: No Mottled: No Pallor: No Rubor: No Treatment Notes Electronic Signature(s) Signed: 12/06/2022 4:29:34 PM By: Carmen Corwin DO Entered By: [name] on 12/06/2022 13:25:27 -------------------------------------------------------------------------------- Multi-Disciplinary Care Plan Details Patient Name: Date of Service: [name], [name]. 12/06/2022 12:30 PM Medical Record Number: [id number] Patient Account Number: [id number] Date of Birth/Sex: Treating RN: April 11, 1951 (72 y.o. [name] Primary Care Dennison Mcdaid: [name] Other Clinician: Referring Amedio Bowlby: Treating  Frayda Egley/Extender: [name] in Treatment: 8 Active Inactive Nutrition Nursing Diagnoses: Potential for alteratiion in Nutrition/Potential for imbalanced nutrition Goals: Patient/caregiver agrees to and verbalizes understanding of need to obtain nutritional consultation Date Initiated: 10/11/2022 Target Resolution Date: 10/21/2022 Goal Status: Active Interventions: Provide education on elevated blood sugars and impact on wound healing Provide education on nutrition Treatment Activities: Patient referred to Primary Care Physician for further nutritional evaluation : 10/11/2022 Notes: Orientation to the Wound Care Program Nursing Diagnoses: Knowledge deficit related to the wound healing center program Goals: SERI, KIMMER ([id number]) [id number].pdf Page 4 of 8 Patient/caregiver will verbalize understanding of the Wound Healing Center Program Date Initiated: 10/11/2022 Target Resolution Date: 10/22/2022 Goal Status: Active Interventions: Provide education on orientation to the wound center Notes: Pressure Nursing Diagnoses: Potential for impaired tissue integrity related to pressure, friction, moisture, and shear Goals: Patient will remain free from development of additional pressure ulcers Date Initiated: 10/11/2022 Target Resolution Date: 10/22/2022 Goal Status: Active Patient/caregiver will verbalize understanding of pressure ulcer management Date Initiated: 10/11/2022 Target Resolution Date: 10/22/2022 Goal Status: Active Interventions: Assess: immobility, friction, shearing, incontinence upon admission and as needed Assess offloading mechanisms upon admission and as needed Provide education on pressure ulcers Notes: Wound/Skin Impairment Nursing Diagnoses: Knowledge deficit related to ulceration/compromised skin integrity Goals: Patient/caregiver will verbalize understanding of skin care regimen Date Initiated:  10/11/2022 Target Resolution Date: 10/22/2022 Goal Status: Active Interventions: Assess patient/caregiver ability to perform ulcer/skin care regimen upon admission and as needed Assess ulceration(s) every visit Provide education on ulcer and skin care Treatment Activities: Skin care regimen initiated : 10/11/2022 Topical wound management initiated : 10/11/2022 Notes: Electronic Signature(s) Signed: 12/07/2022 4:52:02 PM By: [name] Entered By: [name] on 12/06/2022 12:50:44 -------------------------------------------------------------------------------- Negative Pressure Wound Therapy Maintenance (NPWT) Details Patient Name: Date of Service: [name], [name]. 12/06/2022 12:30 PM Medical Record Number: [id number] Patient Account Number: [id number] Date of Birth/Sex: Treating RN: 1951-01-07 (72 y.o. [name] Primary Care Maelie Chriswell: [name] Other Clinician: Referring Zayin Valadez: Treating Cielo Arias/Extender: [name] in Treatment: 8 NPWT Maintenance Performed for: Wound #1 Left Gluteus Performed By: Carmen Grills, RN Type: VAC System Coverage Size (sq cm): 0.2 Pressure Type: Constant Pressure Setting: 125 mmHG Drain Type: None [name], [name] ([id number]) [id number] Page 5 of 8 Primary Contact: None Sponge/Dressing Type: Foam- Black Date Initiated: 11/16/2022 Dressing Removed: Yes Quantity of Sponges/Gauze Removed: 1 Canister Changed: No Canister Exudate Volume: 25 Dressing Reapplied: Yes Quantity of Sponges/Gauze Inserted: x1 with black foam bridged to  hip. Respones T Treatment: o tolerated well Days On NPWT : 21 Post Procedure Diagnosis Same as Pre-procedure Notes Duoderm to periwound Electronic Signature(s) Signed: 12/07/2022 4:52:02 PM By: [name] Entered By: [name] on 12/06/2022 13:27:47 -------------------------------------------------------------------------------- Pain  Assessment Details Patient Name: Date of Service: [name], [name]. 12/06/2022 12:30 PM Medical Record Number: [id number] Patient Account Number: [id number] Date of Birth/Sex: Treating RN: October 11, 1950 (72 y.o. [name] Primary Care Kayonna Lawniczak: [name] Other Clinician: Referring Vinetta Brach: Treating Correne Lalani/Extender: [name] in Treatment: 8 Active Problems Location of Pain Severity and Description of Pain Patient Has Paino No Site Locations Pain Management and Medication Current Pain Management: Electronic Signature(s) Signed: 12/07/2022 4:52:02 PM By: [name] Entered By: [name] on 12/06/2022 12:36:56 [name] ([id number]) [id number] Page 6 of 8 -------------------------------------------------------------------------------- Patient/Caregiver Education Details Patient Name: Date of Service: [name] [place], [name] 6/24/2024andnbsp12:30 PM Medical Record Number: [id number] Patient Account Number: [id number] Date of Birth/Gender: Treating RN: [birth year] (72 y.o. [name] Primary Care Physician: [name] Other Clinician: Referring Physician: Treating Physician/Extender: [name] in Treatment: 8 Education Assessment Education Provided To: Patient and Caregiver Education Topics Provided Wound/Skin Impairment: Methods: Explain/Verbal Responses: State content correctly Electronic Signature(s) Signed: 12/07/2022 4:52:02 PM By: [name] Entered By: [name] on 12/06/2022 12:52:15 -------------------------------------------------------------------------------- Wound Assessment Details Patient Name: Date of Service: [name], [name]. 12/06/2022 12:30 PM Medical Record Number: [id number] Patient Account Number: [id number] Date of Birth/Sex: Treating RN: [birth year] (72 y.o. [name] Primary Care Malvina Schadler: [name] Other  Clinician: Referring Azhar Knope: Treating Markale Birdsell/Extender: [name] in Treatment: 8 Wound Status Wound Number: 1 Primary Etiology: Pressure Ulcer Wound Location: Left Gluteus Wound Status: Open Wounding Event: Pressure Injury Comorbid History: Cataracts, Lymphedema, Hypotension, Quadriplegia Date Acquired: 07/15/2022 Weeks Of Treatment: 8 Clustered Wound: No Photos Wound Measurements Length: (cm) 0.4 Width: (cm) 0.5 [name], [name] ([id number]) Depth: (cm) 0 Area: (cm) Volume: (cm) % Reduction in Area: 58.4% % Reduction in Volume: 80.8% [id number] Page 7 of 8 .6 Epithelialization: Small (1-33%) 0.157 Tunneling: Yes 0.094 Position (o'clock): 12 Maximum Distance: (cm) 2.3 Undermining: No Wound Description Classification: Category/Stage III Wound Margin: Distinct, outline attached Exudate Amount: Medium Exudate Type: Serosanguineous Exudate Color: red, brown Foul Odor After Cleansing: No Slough/Fibrino Yes Wound Bed Granulation Amount: Medium (34-66%) Exposed Structure Granulation Quality: Red, Pink Fascia Exposed: No Necrotic Amount: Medium (34-66%) Fat Layer (Subcutaneous Tissue) Exposed: Yes Necrotic Quality: Adherent Slough Tendon Exposed: No Muscle Exposed: No Joint Exposed: No Bone Exposed: No Periwound Skin Texture Texture Color No Abnormalities Noted: No No Abnormalities Noted: No Callus: No Atrophie Blanche: No Crepitus: No Cyanosis: No Excoriation: No Ecchymosis: No Induration: No Erythema: No Rash: No Hemosiderin Staining: No Scarring: No Mottled: No Pallor: No Moisture Rubor: No No Abnormalities Noted: No Dry / Scaly: No Maceration: No Treatment Notes Wound #1 (Gluteus) Wound Laterality: Left Cleanser Vashe 5.8 (oz) Discharge Instruction: Cleanse the wound with Vashe prior to applying a clean dressing using gauze sponges, not tissue or cotton balls. Peri-Wound  Care Topical Primary Dressing Hydrofera Blue Classic Foam Rope Dressing, 9x6 (mm/in) Discharge Instruction: Moisten with saline prior to packing Secondary Dressing Bordered Gauze, 4x4 in Discharge Instruction: Apply over primary dressing as directed. Secured With 11M Medipore H Soft Cloth Surgical T ape, 4 x 10 (in/yd) Discharge Instruction: Secure with tape as directed. Compression Wrap Compression Stockings Add-Ons Electronic Signature(s) Signed: 12/07/2022 4:52:02  PM By: [name] Entered By: [name] on 12/06/2022 12:54:50 [name] ([id number]) [id number] Page 8 of 8 -------------------------------------------------------------------------------- Vitals Details Patient Name: Date of Service: [name], [name]. 12/06/2022 12:30 PM Medical Record Number: [id number] Patient Account Number: [id number] Date of Birth/Sex: Treating RN: 1950/12/04 (72 y.o. [name] Primary Care Neaveh Belanger: [name] Other Clinician: Referring Jordell Outten: Treating Retal Tonkinson/Extender: [name] in Treatment: 8 Vital Signs Time Taken: 12:36 Temperature ([name]): 98.6 Height (in): 64 Pulse (bpm): 86 Weight (lbs): 115 Respiratory Rate (breaths/min): 18 Body Mass Index (BMI): 19.7 Blood Pressure (mmHg): 105/63 Reference Range: 80 - 120 mg / dl Electronic Signature(s) Signed: 12/07/2022 4:52:02 PM By: [name] Entered By: [name] on 12/06/2022 12:36:48

## 2022-12-08 ENCOUNTER — Ambulatory Visit: Payer: Medicare PPO | Admitting: Physical Therapy

## 2022-12-08 ENCOUNTER — Ambulatory Visit: Payer: Medicare PPO | Admitting: Occupational Therapy

## 2022-12-09 ENCOUNTER — Ambulatory Visit: Payer: Medicare PPO | Admitting: Physical Therapy

## 2022-12-09 ENCOUNTER — Encounter: Payer: Medicare PPO | Admitting: Occupational Therapy

## 2022-12-13 ENCOUNTER — Encounter (HOSPITAL_BASED_OUTPATIENT_CLINIC_OR_DEPARTMENT_OTHER): Payer: Medicare PPO | Admitting: Internal Medicine

## 2022-12-14 ENCOUNTER — Ambulatory Visit: Payer: Medicare PPO | Attending: Physical Medicine and Rehabilitation | Admitting: Physical Therapy

## 2022-12-14 ENCOUNTER — Ambulatory Visit (HOSPITAL_BASED_OUTPATIENT_CLINIC_OR_DEPARTMENT_OTHER): Payer: Medicare PPO | Admitting: Internal Medicine

## 2022-12-14 ENCOUNTER — Ambulatory Visit: Payer: Medicare PPO | Admitting: Occupational Therapy

## 2022-12-14 DIAGNOSIS — G8253 Quadriplegia, C5-C7 complete: Secondary | ICD-10-CM | POA: Insufficient documentation

## 2022-12-14 DIAGNOSIS — R208 Other disturbances of skin sensation: Secondary | ICD-10-CM

## 2022-12-14 DIAGNOSIS — M24541 Contracture, right hand: Secondary | ICD-10-CM | POA: Insufficient documentation

## 2022-12-14 DIAGNOSIS — R29818 Other symptoms and signs involving the nervous system: Secondary | ICD-10-CM | POA: Diagnosis present

## 2022-12-14 DIAGNOSIS — R2689 Other abnormalities of gait and mobility: Secondary | ICD-10-CM

## 2022-12-14 DIAGNOSIS — L89323 Pressure ulcer of left buttock, stage 3: Secondary | ICD-10-CM | POA: Diagnosis not present

## 2022-12-14 DIAGNOSIS — M24542 Contracture, left hand: Secondary | ICD-10-CM | POA: Diagnosis not present

## 2022-12-14 DIAGNOSIS — G8254 Quadriplegia, C5-C7 incomplete: Secondary | ICD-10-CM

## 2022-12-14 DIAGNOSIS — M6281 Muscle weakness (generalized): Secondary | ICD-10-CM | POA: Diagnosis present

## 2022-12-14 DIAGNOSIS — N319 Neuromuscular dysfunction of bladder, unspecified: Secondary | ICD-10-CM | POA: Diagnosis not present

## 2022-12-14 DIAGNOSIS — R29898 Other symptoms and signs involving the musculoskeletal system: Secondary | ICD-10-CM | POA: Insufficient documentation

## 2022-12-14 DIAGNOSIS — R278 Other lack of coordination: Secondary | ICD-10-CM | POA: Insufficient documentation

## 2022-12-14 DIAGNOSIS — R293 Abnormal posture: Secondary | ICD-10-CM | POA: Insufficient documentation

## 2022-12-14 DIAGNOSIS — R338 Other retention of urine: Secondary | ICD-10-CM | POA: Diagnosis not present

## 2022-12-14 NOTE — Therapy (Signed)
OUTPATIENT PHYSICAL THERAPY NEURO TREATMENT-RECERTIFICATION   Patient Name: [name] MRN: [id number] DOB:September 14, 1950, 72 y.o., female Today's Date: 12/15/2022   PCP: [name], MD REFERRING PROVIDER: [name], MD   END OF SESSION:  PT End of Session - 12/14/22 1536     Visit Number 17    Number of Visits 25   recert   Date for PT Re-Evaluation 01/26/23   to allow for scheduling delays   Authorization Type [employer] MEDICARE    Progress Note Due on Visit 20    PT Start Time 1532    PT Stop Time 1615    PT Time Calculation (min) 43 min    Equipment Utilized During Treatment Gait belt    Activity Tolerance Patient tolerated treatment well    Behavior During Therapy [facility] for tasks assessed/performed                Past Medical History:  Diagnosis Date   CERVICAL POLYP 03/11/2008   Qualifier: Diagnosis of  By: [name] MD, [name]    Colon polyps 2005   on colonscopy [name]   Fibroid 2004   Per Dr. [name]   History of shingles    face and mouth   Hx of skin cancer, basal cell    Rosacea    Sciatica of left side 09/28/2013   Scoliosis    noted on mri done for back pain   Past Surgical History:  Procedure Laterality Date   BUNIONECTOMY     Patient Active Problem List   Diagnosis Date Noted   Buttock wound, left, initial encounter 11/02/2022   Orthostatic hypotension 08/13/2022   Neurogenic bowel 05/03/2022   Spasticity 05/03/2022   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/03/2022   Nerve pain 05/03/2022   Medication monitoring encounter 01/08/2022   Neurogenic bladder 10/11/2021   Urinary incontinence 10/11/2021   ESBL (extended spectrum beta-lactamase) producing bacteria infection 10/09/2021   Recurrent UTI 10/09/2021   Quadriplegia, C5-C7 incomplete (HCC) 01/16/2021   History of spinal fracture 01/16/2021   Suprapubic catheter (HCC) 01/16/2021   Encounter for routine gynecological examination 09/28/2013   Onychomycosis 09/28/2013   Foot deformity,  acquired 03/26/2012   Encounter for preventive health examination 12/25/2010   ROSACEA 08/25/2009   Disturbance in sleep behavior 03/11/2008   SKIN CANCER, HX OF 03/11/2008   DYSURIA, HX OF 03/11/2008   Hyperlipidemia 02/10/2007   CERVICALGIA 02/10/2007    ONSET DATE: 09/28/2022 (most recent referral)  REFERRING DIAG: G82.54 (ICD-10-CM) - Quadriplegia, C5-C7, incomplete (HCC)  THERAPY DIAG:  Muscle weakness (generalized)  Abnormal posture  Quadriplegia, C5-C7 incomplete (HCC)  Other symptoms and signs involving the nervous system  Rationale for Evaluation and Treatment: Rehabilitation  SUBJECTIVE:  SUBJECTIVE STATEMENT: Pt reports that her trip in the plane went well, she had her husband help lift her from her manual wheelchair to the floor of the plane on a 4-6" mat then to the seat of the plane from there. When she was in [place] there was a nurse at the airport who helped them with the transfer back onto the plane. Pt still wants to work towards standing to make this transfer easier on caregivers. Pt reports that her wound appeared to be healing but does have some tunneling, continues to use a wound vac.  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 Yes: NPRS scale: 3/10 Pain location: forearms to fingertips Pain description: constant, pinprick/tingling Aggravating factors: nighttime Relieving factors: nothing, sometimes medicines  PRECAUTIONS: Fall; suprapubic catheter (she wants to get this removed meaning she needs to get to and from the toilet); she has had minor heat sensation when needing to complete her bowel program-possible AD?  WEIGHT BEARING  RESTRICTIONS: No-pt was in stander at most recent therapy in [place] last week.  She will have a bone density done soon w/ Dr. [name].  FALLS: Has patient fallen in last 6 months? No  LIVING ENVIRONMENT: Lives with: lives with their spouse and live-in nurse [name] Lives in: House/apartment-townhouse Stairs: No-level entry, but multi-level home 4 stories w/ elevator Has following equipment at home: Wheelchair (power), Wheelchair (manual), Grab bars, and standing frame, sliding board, transport shower chair, handheld shower head, leg strap-pt reports she no longer finds this helpful  PLOF: Requires assistive device for independence, Needs assistance with ADLs, Needs assistance with homemaking, and Needs assistance with transfers  OCCUPATION:  Writer-uses Dragon to dictate  PATIENT GOALS: "Make my right leg work."  Stand and pivot so she can more easily access a toilet.  OBJECTIVE:   DIAGNOSTIC FINDINGS: No recent relevant imaging.  Bone density scheduled 10/15/2022.  COGNITION: Overall cognitive status: Within functional limits for tasks assessed   SENSATION: Light touch: Diminished ability to distinguish sharp and dull, but able to distinguish light touch from injury level down accurately  COORDINATION: Not formally assessed.  EDEMA:  Well managed w/ lymphatic massage and compression stockings.  MUSCLE TONE: Pt has intermittent clonus during transfers.  POSTURE: rounded shoulders, posterior pelvic tilt, and right thoracic scoliosis   LOWER EXTREMITY ROM:     Passive  Right 10/20/22 Left 10/20/22  Hip flexion [facility]  Hip extension    Hip abduction    Hip adduction    Hip internal rotation [facility]  Hip external rotation [facility]  Knee flexion [facility] WFL  Knee extension [facility] WFL  Ankle dorsiflexion Slight PF contracture Slight PF contracture  Ankle plantarflexion    Ankle inversion    Ankle eversion     (Blank rows = not tested)    LOWER EXTREMITY MMT:    MMT  Right 10/20/22 Left 10/20/22  Hip flexion 1 2+  Hip extension    Hip abduction    Hip adduction    Hip internal rotation    Hip external rotation    Knee flexion 2- 3  Knee extension 2- 3  Ankle dorsiflexion 0 3  Ankle plantarflexion    Ankle inversion    Ankle eversion     (Blank rows = not tested)   BED MOBILITY:  Sit to supine Mod A Supine to sit  Mod A Rolling to Right Mod A Rolling to Left Mod A Undulating mattress for wound management on standard bed (elevated-so often doing uphill sliding board transfers); she would like to continue working on sitting up independently, she has been working on rolling, needs less assistance w/ this when someone props her leg into hooklying; would like something to help her pull her left leg to her butt for stretching as well as bed mobility.  FUNCTIONAL TESTS:  None relevant to pt's current functional level and abilities.  PATIENT SURVEYS:  None completed due to time.  TODAY'S TREATMENT:        TherAct/NMR: Session focus on assessment of LTG. Pt received seated in her PWC. Bump transfer PWC to mat table with min A for some LE management. Sit to supine mod A needed for BLE management. Rolling L/R with min A for setup of LE for transfer, otherwise pt able to perform rolling independently on mat table. Supine to sitting EOM with min A initially for RLE management via logroll technique. Pt returned to supine with mod A again for BLE management. Pt attempts to return to sitting EOM but ultimately needs use of chair as bedrail to perform transfer more independently with min A. Discussed use of leg loops/leg lifter to manage BLE more independently but pt not interested in using this AE currently. Also recommended that she could try to utilize grabbing onto pant legs to move LE but pt tends to wear loose, stretchy pants in order to increase ease of transfers and clothing management which are not an ideal material to use to aid with transfers.  Remainder  of session focused on performing unlevel SB transfers going uphill PWC to/from mat table. Pt only requires assistance for placement of slideboard and min A for management of her LE during transfer, otherwise she performs the majority of these transfers independently.  Pt left seated in PWC with caregiver [name] at end of session.   PATIENT EDUCATION: Education details:  See above Person educated: Patient and Arts administrator Education method: Explanation Education comprehension: verbalized understanding  HOME EXERCISE PROGRAM: Will be established as needed as pt has done continuous therapy and is working towards functional tasks.  GOALS: Goals reviewed with patient? Yes  SHORT TERM GOALS: Target date: 11/12/2022  HEP to be established for stretching and strengthening as needed. Baseline:  Pt has established home routine and adequate caregiver assistance at this time (5/30)  Goal status: IN PROGRESS  2.  Pt will be able to perform rolling L and R w/ no more than minA in order to improve functional mobility. Baseline: modA, min A to setup LE into hookyling (5/28) Goal status: MET  3.  Pt will perform sit<>supine requiring no more than minA in order to improve functional mobility. Baseline: modA, mod A (5/28) Goal status: IN PROGRESS  4.  Pt will perform bilateral reach outside seated BOS with feet supported without LOB in order to improve safety with ADL performance. Baseline: lateral LOB bilaterally demonstrated in w/c; pt able to reach 2-3 inches outside BOS w/ moderate wobble (R worse than L - 5/30) Goal status: MET  LONG TERM GOALS: Target date: 12/10/2022  Pt will perform squat pivot transfer w/ no more than modA in order to improve access to home environment for toilet transfers. Baseline: minA for lateral scoot transfer, pt able to clear bottom intermittently; min A for "bump" transfer with assist just needed for LE management (7/2) Goal status: IN PROGRESS  2.  Pt will  perform sit<>supine requiring no more than supervision in order to improve transfers in and out of bed. Baseline:  modA, min A (7/2) Goal status: IN PROGRESS  3.  Pt will report wound healing in order to return to day program at [facility]. Baseline:  Ischial wound, pt reports wound is healing but does have tunneling so she continues to utilize a wound vac and wound care services (7/2) Goal status: IN PROGRESS  4.  Pt will be able to perform rolling L and R w/ no more than supervision in order to improve functional mobility. Baseline: modA, min A for setup of LE due to pt declining use of AE (7/2) Goal status: IN PROGRESS  5.  Pt will perform uphill sliding board transfer w/ no more than modA in order to improve transfer in home environment. Baseline:  To be assessed., min A for LE management otherwise setup A (7/2) Goal status: MET  6.  Pt will perform partial STS in stander or from elevated power w/c to counter w/ no more than modA in order to improve capacity for higher level transfers and access to uneven surfaces. Baseline: To be assessed., max A to stand in stedy, goal to be further assessed with available skilled +2 on 12/17/22 (7/2) Goal status: IN PROGRESS   NEW SHORT TERM GOALS=LONG TERM GOALS due to length of POC   NEW LONG TERM GOALS:  Target date: 01/12/2023  Pt will perform squat pivot transfer w/ no more than modA in order to improve access to home environment for toilet transfers. Baseline: minA for lateral scoot transfer, pt able to clear bottom intermittently; min A for "bump" transfer with assist just needed for LE management (7/2) Goal status: IN PROGRESS  2.  Pt will report wound healing in order to return to day program at [facility]. Baseline:  Ischial wound, pt reports wound is healing but does have tunneling so she continues to utilize a wound vac and wound care services (7/2) Goal status: IN PROGRESS  3.  Pt will perform sit to stand transfer with no  more than mod A with LRAD to demonstrate improved capacity for higher level transfers and access to uneven surfaces Baseline: max A to stand to stedy as of 7/2 Goal status: IN PROGRESS  4.  Pt will demonstrate ability to place slide board independently in preparation for slide board transfers Baseline: dependent for placement (7/2) Goal status: INITIAL  5.  Pt and caregiver will demonstrate independence with use of patient's personal NMES device for safe muscle activation and strengthening in her home environment Baseline: min cueing for safe setup (7/2) Goal status: INITIAL    ASSESSMENT:  CLINICAL IMPRESSION: Emphasis of skilled PT session on assessing LTG and creating new LTG in preparation for recertification of PT services this date. Pt has met 1/6 LTG but is making progress towards 6/6 LTG. Pt is able to perform bump transfers with min A needed for management of her feet at times, unable to complete a full squat pivot currently. Pt is also able to perform unlevel SB transfers with setup A needed for placement of board and min A needed for management of her feet. Additionally, pt continues to require up to mod A for sit to/from supine transfers for BLE management. She is able to perform rolling R/L with min A needed for one LE management. Pt could be more independent with rolling and with transfers if she utilized AE for LE management, however pt is not currently interested in  use of leg lifters, leg loops etc and her clothing is not conducive to assisting her with moving her LE with use of UE. Pt has progressed from utilizing the standing frame for standing to being able to stand in the stedy with good core control noted. However, pt does fatigue very quickly in stedy due to decreased support from device and increased demand on her body structures to hold herself upright. Future sessions plan to assess pt's ability to stand in // bars with skilled assist x 2 to continue to progress towards  patient's goal of performing a stand pivot transfer. Pt continues to benefit from skilled therapy services to work towards increasing her safety and independence with functional mobility and towards her LTGs. Continue POC.   OBJECTIVE IMPAIRMENTS: decreased balance, decreased coordination, decreased mobility, difficulty walking, decreased ROM, decreased strength, hypomobility, increased edema, impaired flexibility, impaired sensation, impaired tone, impaired UE functional use, improper body mechanics, postural dysfunction, and pain.   ACTIVITY LIMITATIONS: carrying, lifting, bending, sitting, standing, squatting, stairs, transfers, bed mobility, continence, bathing, toileting, dressing, reach over head, hygiene/grooming, locomotion level, and caring for others  PARTICIPATION LIMITATIONS: meal prep, cleaning, laundry, driving, and community activity  PERSONAL FACTORS: Age, Fitness, Past/current experiences, Time since onset of injury/illness/exacerbation, and 1-2 comorbidities: intermittent AD, neurogenic bowel/bladder on active bladder training  are also affecting patient's functional outcome.   REHAB POTENTIAL: Good  CLINICAL DECISION MAKING: Evolving/moderate complexity  EVALUATION COMPLEXITY: Moderate  PLAN:  PT FREQUENCY: 2x/week  PT DURATION: 8 weeks + 4 weeks (recert)  PLANNED INTERVENTIONS: Therapeutic exercises, Therapeutic activity, Neuromuscular re-education, Balance training, Patient/Family education, Self Care, DME instructions, Electrical stimulation, Wheelchair mobility training, Manual therapy, and Re-evaluation  PLAN FOR NEXT SESSION:  NMES to LE (making sure pt and [name] know how to set it up, where to place electrodes, etc), Core strength, static sitting balance-perturbations.  Least restrictive standing--can progress to stedy vs in // bars if skilled +2 available, use of powderboard on mat table for RLE strengthening, wants to work on increasing independence with lateral  scoot transfers without SB and also wants to be able to place SB independently and move her LE more independently; use of leg loops/lifters to increase independence with LE management with transfers and bed mobility if pt is willing to use AE, standing in // bars with knees blocked or knee immobilizers to assess standing without stedy, did she purchase Vive patient turner?  Peter Congo, PT Peter Congo, PT, DPT, CSRS   12/15/2022, 9:10 AM

## 2022-12-14 NOTE — Therapy (Signed)
OUTPATIENT OCCUPATIONAL THERAPY NEURO TREATMENT NOTE Patient Name: [name] MRN: [id number] DOB:09/02/1950, 72 y.o., female Today's Date: 12/14/2022  PCP: [name], MD  REFERRING PROVIDER: [name], MD  END OF SESSION:  OT End of Session - 12/14/22 1455     Visit Number 17    Number of Visits 25    Date for OT Re-Evaluation 01/07/23    Authorization Type [employer] - requires auth, [place] (additional auth requested)    Authorization Time Period 10/12/22 - 12/10/22    Authorization - Number of Visits 17    Progress Note Due on Visit 26    OT Start Time 1452    OT Stop Time 1531    OT Time Calculation (min) 39 min    Activity Tolerance Patient tolerated treatment well    Behavior During Therapy [facility] for tasks assessed/performed              Past Medical History:  Diagnosis Date   CERVICAL POLYP 03/11/2008   Qualifier: Diagnosis of  By: [name] MD, [name]    Colon polyps 2005   on colonscopy [name]   Fibroid 2004   Per Dr. [name]   History of shingles    face and mouth   Hx of skin cancer, basal cell    Rosacea    Sciatica of left side 09/28/2013   Scoliosis    noted on mri done for back pain   Past Surgical History:  Procedure Laterality Date   BUNIONECTOMY     Patient Active Problem List   Diagnosis Date Noted   Buttock wound, left, initial encounter 11/02/2022   Orthostatic hypotension 08/13/2022   Neurogenic bowel 05/03/2022   Spasticity 05/03/2022   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/03/2022   Nerve pain 05/03/2022   Medication monitoring encounter 01/08/2022   Neurogenic bladder 10/11/2021   Urinary incontinence 10/11/2021   ESBL (extended spectrum beta-lactamase) producing bacteria infection 10/09/2021   Recurrent UTI 10/09/2021   Quadriplegia, C5-C7 incomplete (HCC) 01/16/2021   History of spinal fracture 01/16/2021   Suprapubic catheter (HCC) 01/16/2021   Encounter for routine gynecological examination 09/28/2013   Onychomycosis  09/28/2013   Foot deformity, acquired 03/26/2012   Encounter for preventive health examination 12/25/2010   ROSACEA 08/25/2009   Disturbance in sleep behavior 03/11/2008   SKIN CANCER, HX OF 03/11/2008   DYSURIA, HX OF 03/11/2008   Hyperlipidemia 02/10/2007   CERVICALGIA 02/10/2007    ONSET DATE: 07/28/2020  Date of Referral 09/28/22  REFERRING DIAG: G82.54 (ICD-10-CM) - Quadriplegia, C5-C7, incomplete (HCC)  THERAPY DIAG:  Muscle weakness (generalized)  Other lack of coordination  Abnormal posture  Other abnormalities of gait and mobility  Quadriplegia, C5-C7 incomplete (HCC)  Other symptoms and signs involving the nervous system  Contracture of hand joint, left  Contracture of hand joint, right  Other disturbances of skin sensation  Other symptoms and signs involving the musculoskeletal system  Rationale for Evaluation and Treatment: Rehabilitation  SUBJECTIVE:   SUBJECTIVE STATEMENT:   Pt reports she has a yellow flexbar at home and does 10 reps of supination and pronation.   Pt accompanied by:  Live in Caregiver - [name]   PERTINENT HISTORY: "Pt is a 72 yr old L handed female with hx of incomplete quadriplegia- 2/14 2022- fleeing the police in [place] on passenger 100 (high speed) miles/hour,  [name]; neurogenic bowel and bladder and spasticity; [name], [name] Here for f/u on Incomplete quadriplegia"  Referral from  MD 09/28/22 states, "Please eval and treat for ADLs and higher level mobility."  PRECAUTIONS: Fall; suprapubic catheter (she wants to get this removed meaning she needs to get to and from the toilet); she has had minor heat sensation when needing to complete her bowel program-possible AD?   WEIGHT BEARING RESTRICTIONS: No-pt was in stander at most recent therapy in [place] last week.  She will have a bone density done soon w/ Dr. [name].   PAIN:  Patient reports chronic pain 3/10 fingers to elbow bilaterally managed by  medication and will inform therapy staff of any changes.  FALLS: Has patient fallen in last 6 months? No  LIVING ENVIRONMENT: Lives with: lives with their family - husband [name] and with an adult companion s/p moving back up from [place] x10 months Lives in: House/apartment Stairs:  4 story town house with an Engineer, structural with threshold adjustments, roll in shower with transport chair Has following equipment at home: Wheelchair (power) - with seat height adjustments to access counters and reclining option, Wheelchair (manual), transport WC, shower chair, and Ramped entry, handheld showerhead with rails around toilet, had Michiel Sites but is no longer in need of it, has slide boards x3  PLOF: Requires assistive device for independence, Needs assistance with ADLs, Needs assistance with homemaking, Needs assistance with gait, and Needs assistance with transfers; 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  Used to like to knit, sew and bake.  PATIENT GOALS: Wants to be able to type - currently using advanced Dragon dictation at times but prefers to type at times.  She would like to be able to write better and is interested in resuming some leisure activities such as Archivist and baking.  She also wants to keep working on being able to cut her own food and on her UE strength.   OBJECTIVE:   HAND DOMINANCE: Left  ADLs: Overall ADLs: Patient has a live in caregiver  Transfers/ambulation related to ADLs: Mod assist with sliding board transfers (previously [employer] lift).  Eating: Has a rocker knife that she can use. Used to use adapted utensils but now uses regular utensils but still will get assistance to cut food ie) when eating out.  Grooming: can brush her own hair but unable to manage jewelry ie) earrings  UB Dressing: can zip/unzip after it has been started, unable to manage buttons herself, Caregiver assists but if she has extra time, she can put on her bra, and a loose fitting pullover  shirt/t-shirt  LB Dressing: dependent for LB dressing in bed and with special sock donner for LE compression garments   Toileting: bladder trained with suprapubic catheter which she clamps off.  Dependent for bowel incontinence care.  Bathing: Sponge bath with adult washclothes.  Can bath UB with back scrubber for most of her back.  Needs help with feet (mentioned she might need a separate brush for feet)   Tub Shower transfers: Min-mod assist with slide board  Equipment: Shower seat with back, Walk in shower, bed side commode, Reacher, Sock aid, Long handled sponge, and Feeding equipment  IADLs: --  Shopping: Assisted by caregiver  Light housekeeping: Has housekeeper that comes monthly  Meal Prep: previously enjoyed baking. Assisted by caregiver but described recent success at reheating a meal for herself after getting food out of the fridge/freezer from her WC.  Community mobility: Dependent  Medication management: Caregiver sorts them into pillbox but she is very aware of her medications   Financial management: Patient manages her  own finances  Handwriting: Increased time and has a pen with a little grip  MOBILITY STATUS: Independent with power mobiity  POSTURE COMMENTS:  No Significant postural limitations and forward head Sitting balance: Supports self independently with both Ues  ACTIVITY TOLERANCE: Activity tolerance: Fair - MMT WFL but has limited sustained tolerance for ongoing use of UEs  FUNCTIONAL OUTCOME MEASURES: 10/20/22 QuickDash 31.8 points   UPPER EXTREMITY ROM:   AROM - WFL without obvious contractures, some digital flexion noted but PROM WNL   AROM Right (eval) Left (eval)  Shoulder flexion [facility]  Shoulder abduction [facility]  Elbow flexion [facility] WFL  Elbow extension [facility]  Wrist flexion [facility] WFL  Wrist extension WFL WFL  Ulnar deviation WFL Decreased ulnar  deviation past midline  Wrist pronation [facility] WFL  Wrist supination [facility]  Digit  Composite Flexion Lacks full AROM:   1st digit - 5 cm   3rd digit -1 cm   4th digit - 2 cm  Lacks full AROM:   1st digit - 1.5 cm  5th digit - 3 cm   Digit Composite Extension [facility]  Digit Opposition Opposition to index finger only Lacks to pinkie due to limited DIP pinkie flexion  (Blank rows = not tested)  UPPER EXTREMITY MMT:   Grossly WFL - Endurance limited R tricep strength > than L but L UE generally stronger than R UE  MMT Right (eval) Left (eval)  Shoulder flexion 4/5 4/5  Shoulder abduction 4/5 4/5  Elbow flexion 4/5 4/5  Elbow extension 4/5 4-/5  (Blank rows = not tested)  HAND FUNCTION: Grip strength: Right: 4.8 lbs; Left: 20.9 lbs 11/17/22 Right - 7.2 lbs  COORDINATION: Finger Nose Finger test: R generally WFL, L WNL Box and Blocks:  Right 28 blocks, Left 37 blocks R hand finger eventually cramps and dexterity worsens in the cold  SENSATION: Light touch: Impaired  - patient   EDEMA: NA for UEs but LE has poor lymph drainage with custom compression garments   MUSCLE TONE: Generally WFL but will assess further  COGNITION: Overall cognitive status: Within functional limits for tasks assessed  VISION: Subjective report: Patent wears progressive lens/glasses.  Denies diplopia or vision changes but has eye exam in the next couple of months. Baseline vision: Wears glasses all the time  VISION ASSESSMENT: WFL  EVALUATION OBSERVATIONS: Patient independent with power WC navigation within clinic.  Patient is well-kept with foley catheter in place.  She has slight limitations in full extension of digits but PROM is WNL and she has splints at home that she said she will bring for OT staff to assess.  She has functional ROM of B UEs to reach her head, behind her back and to cross midline to assist with ADLs.     TODAY'S TREATMENT:                                                                                                                                 -  Therapeutic activities completed for duration as noted below including: Adapted knitting activity using cut tennis ball an use of Dycem in lap. Trialed use of collared base for support; however, pt reporting preference for placement of ball in between legs. She also trialed use of dycem in lap for extra stability. OT educated pt on use of yarn tension ring to help with progression of yarn for each knit.  - Therapeutic exercises completed for duration as noted below including:  Pt completed supination and pronation roll with yellow flex bar in LUE x 2 min for ROM and endurance of affected extremity Pt then completed both directions with RUE for 1 min each   Supination with yellow flex bar x 2 min for strength and endurance of affected extremity  Pronation with yellow flex bar x 2 min for strength and endurance of affected extremity   PATIENT EDUCATION: Education details: Archivist; strengthening person educated: Patient and Caregiver Live in caregiver - [name] Education method: Explanation, Demonstration, Tactile cues, and Verbal cues,  Education comprehension: verbalized understanding, returned demonstration, and needs further education   HOME EXERCISE PROGRAM: 11/25/2022: NMES use  12/02/22: All previous HEPs combined to 1 complete List through MedBridge Access Code: [email or web address] URL: [email or web address] Date: 12/02/2022 Prepared by: Amada Kingfisher   GOALS:   SHORT TERM GOALS: Target date: 11/09/22  Patient will be able to use AE/modified techniques to cut soft foods small enough for oral intake. Baseline: Caregiver/spouse assist Goal status: IN PROGRESS  2.  Patient will be able to use AE/modified positioning to complete word search by drawing lines through words with 0 errors. Baseline: TBD Goal status: NOT MET - Goal discontinued as patient focusing on writing more than word search ie) note taking etc.  3.  Patient will verbalize understanding of good pressure relief  schedule (for 15 to 60 seconds every 15 to 60 minutes) to help with wound healing. Baseline: Patient did not change positioning in > 45 minutes of OT eval. Goal status: MET  4.  Patient will be assisted to explore modifications for leisure tasks (knit/sew/bake) with good return demonstration. Baseline: Minimal involvement Goal status: IN PROGRESS  11/17/22 - trialled knitting  12/02/22 - continued AE exploration  5.  Patient will demonstrate independence with HEP for UE strengthening, coordination and ROM to prevent contractures and maintain strength for transfers and ADLs. Baseline: Previous HEPs have been established but need to be reviewed and updated.  Goal status: IN PROGRESS  11/17/22 working on lists and categorizing activities 12/02/22 Comprehensive Med[employer] list compiled  6.  Patient will be assessed for typing speed/dexterity. Baseline: Patient reports difficulty with typing with all her fingers. Goal status: IN PROGRESS  7. Patient will complete Quick Dash UE assessment. Baseline: TBD Goal Status: 10/20/22 Discontinued - see scores above   LONG TERM GOALS: Target date: 01/07/23  Patient will complete 1 small craft/week r/t her leisure interests to work on FMS daily. Baseline: Minimal involvement Goal status: IN PROGRESS  2.  Patient will improve B coordination for increased typing speed/dexterity x 1-2 WPM. Baseline: TBD Goal status: IN PROGRESS  11/17/22 daily engagement with typing at home although modified use of digits for speed  3.  Patient will improve B UE coordination, strength and functional use to bake cookies with setup assistance and AE/modified techniques as appropriate.  Baseline: Not performed. Goal status: IN PROGRESS  4.  Patient will report no more than moderate difficulty using a knife to foods such as chicken, small enough for oral intake using  AE and strategies as needed. Baseline: Caregiver/spouse assist Goal status: IN PROGRESS  5.  Patient will be  able to use AE/modified positioning to complete 4 sentences with 100% legibility. Baseline: Subjective reports of difficulty with writing Goal status: IN PROGRESS   ASSESSMENT:  CLINICAL IMPRESSION:   Pt demonstrating good understanding with respect to DME use. Would benefit from endurance training.   PERFORMANCE DEFICITS: in functional skills including ADLs, IADLs, coordination, dexterity, strength, muscle spasms, Fine motor control, Gross motor control, continence, skin integrity, and UE functional use,   IMPAIRMENTS: are limiting patient from ADLs, IADLs, work, and leisure.   CO-MORBIDITIES: has co-morbidities such as incontinence and wound  that affects occupational performance. Patient will benefit from skilled OT to address above impairments and improve overall function.  REHAB POTENTIAL: Fair due to chronicity of injury   PLAN:  OT FREQUENCY: 2x/week  OT DURATION: additional 4 weeks  PLANNED INTERVENTIONS: self care/ADL training, therapeutic exercise, therapeutic activity, neuromuscular re-education, manual therapy, passive range of motion, balance training, functional mobility training, splinting, patient/family education, energy conservation, coping strategies training, and DME and/or AE instructions  RECOMMENDED OTHER SERVICES: None at this time  CONSULTED AND AGREED WITH PLAN OF CARE: Patient and family member/caregiver  PLAN FOR NEXT SESSION: pizza wheel cutter   Functional kitchen activities.  Review knitting modifications introduced if patient brings activity from home.  Review positioning of R hand to help with finger opposition and isolation of finger joints.  Continue FM tasks to improve typing shoes and progressing simple hobby/craft activities (ie baking, yarn activities & baking).   Delana Meyer, OT 12/14/2022, 4:47 PM

## 2022-12-15 ENCOUNTER — Other Ambulatory Visit: Payer: Self-pay | Admitting: Physical Medicine and Rehabilitation

## 2022-12-15 ENCOUNTER — Other Ambulatory Visit: Payer: Self-pay | Admitting: Internal Medicine

## 2022-12-16 DIAGNOSIS — L89323 Pressure ulcer of left buttock, stage 3: Secondary | ICD-10-CM | POA: Diagnosis not present

## 2022-12-17 ENCOUNTER — Ambulatory Visit: Payer: Medicare PPO | Admitting: Physical Therapy

## 2022-12-17 ENCOUNTER — Ambulatory Visit: Payer: Medicare PPO | Admitting: Occupational Therapy

## 2022-12-17 DIAGNOSIS — R2689 Other abnormalities of gait and mobility: Secondary | ICD-10-CM

## 2022-12-17 DIAGNOSIS — M6281 Muscle weakness (generalized): Secondary | ICD-10-CM

## 2022-12-17 DIAGNOSIS — R293 Abnormal posture: Secondary | ICD-10-CM | POA: Diagnosis not present

## 2022-12-17 DIAGNOSIS — G8254 Quadriplegia, C5-C7 incomplete: Secondary | ICD-10-CM

## 2022-12-17 DIAGNOSIS — M24541 Contracture, right hand: Secondary | ICD-10-CM | POA: Diagnosis not present

## 2022-12-17 DIAGNOSIS — R278 Other lack of coordination: Secondary | ICD-10-CM

## 2022-12-17 DIAGNOSIS — M24542 Contracture, left hand: Secondary | ICD-10-CM | POA: Diagnosis not present

## 2022-12-17 DIAGNOSIS — R29898 Other symptoms and signs involving the musculoskeletal system: Secondary | ICD-10-CM | POA: Diagnosis not present

## 2022-12-17 DIAGNOSIS — R29818 Other symptoms and signs involving the nervous system: Secondary | ICD-10-CM | POA: Diagnosis not present

## 2022-12-17 NOTE — Therapy (Signed)
OUTPATIENT OCCUPATIONAL THERAPY NEURO TREATMENT NOTE Patient Name: [name] MRN: [id number] DOB:16-Aug-1950, 72 y.o., female Today's Date: 12/17/2022  PCP: [name], MD  REFERRING PROVIDER: [name], MD  END OF SESSION:  OT End of Session - 12/17/22 1141     Visit Number 18    Number of Visits 25    Date for OT Re-Evaluation 01/07/23    Authorization Type [employer] - requires auth, [place] (additional auth requested)    Authorization Time Period 10/12/22 - 12/10/22    Authorization - Number of Visits 17    Progress Note Due on Visit 26    OT Start Time 1142    OT Stop Time 1227    OT Time Calculation (min) 45 min    Equipment Utilized During Treatment Kitchen Tools    Activity Tolerance Patient tolerated treatment well    Behavior During Therapy [facility] for tasks assessed/performed              Past Medical History:  Diagnosis Date   CERVICAL POLYP 03/11/2008   Qualifier: Diagnosis of  By: [name] MD, [name]    Colon polyps 2005   on colonscopy [name]   Fibroid 2004   Per Dr. [name]   History of shingles    face and mouth   Hx of skin cancer, basal cell    Rosacea    Sciatica of left side 09/28/2013   Scoliosis    noted on mri done for back pain   Past Surgical History:  Procedure Laterality Date   BUNIONECTOMY     Patient Active Problem List   Diagnosis Date Noted   Buttock wound, left, initial encounter 11/02/2022   Orthostatic hypotension 08/13/2022   Neurogenic bowel 05/03/2022   Spasticity 05/03/2022   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/03/2022   Nerve pain 05/03/2022   Medication monitoring encounter 01/08/2022   Neurogenic bladder 10/11/2021   Urinary incontinence 10/11/2021   ESBL (extended spectrum beta-lactamase) producing bacteria infection 10/09/2021   Recurrent UTI 10/09/2021   Quadriplegia, C5-C7 incomplete (HCC) 01/16/2021   History of spinal fracture 01/16/2021   Suprapubic catheter (HCC) 01/16/2021   Encounter for routine  gynecological examination 09/28/2013   Onychomycosis 09/28/2013   Foot deformity, acquired 03/26/2012   Encounter for preventive health examination 12/25/2010   ROSACEA 08/25/2009   Disturbance in sleep behavior 03/11/2008   SKIN CANCER, HX OF 03/11/2008   DYSURIA, HX OF 03/11/2008   Hyperlipidemia 02/10/2007   CERVICALGIA 02/10/2007    ONSET DATE: 07/28/2020  Date of Referral 09/28/22  REFERRING DIAG: G82.54 (ICD-10-CM) - Quadriplegia, C5-C7, incomplete (HCC)  THERAPY DIAG:  Other lack of coordination  Muscle weakness (generalized)  Abnormal posture  Rationale for Evaluation and Treatment: Rehabilitation  SUBJECTIVE:   SUBJECTIVE STATEMENT:   Pt reported that they had a good trip and had several people help her along the way.  She also reports that she still needs some adjustments to her power WC.   Pt accompanied by:  Live in Caregiver - [name]   PERTINENT HISTORY: "Pt is a 72 yr old L handed female with hx of incomplete quadriplegia- 2/14 2022- fleeing the police in [place] on passenger 100 (high speed) miles/hour,  [name]; neurogenic bowel and bladder and spasticity; [name], [name] Here for f/u on Incomplete quadriplegia"  Referral from MD 09/28/22 states, "Please eval and treat for ADLs and higher level mobility."  PRECAUTIONS: Fall; suprapubic catheter (she wants to get this removed meaning  she needs to get to and from the toilet); she has had minor heat sensation when needing to complete her bowel program-possible AD?   WEIGHT BEARING RESTRICTIONS: No-pt was in stander at most recent therapy in [place] last week.  She will have a bone density done soon w/ Dr. [name].   PAIN:  Patient reports chronic pain 3/10 fingers to elbow bilaterally managed by medication and will inform therapy staff of any changes.  FALLS: Has patient fallen in last 6 months? No  LIVING ENVIRONMENT: Lives with: lives with their family - husband [name] and with an adult  companion s/p moving back up from [place] x10 months Lives in: House/apartment Stairs:  4 story town house with an Engineer, structural with threshold adjustments, roll in shower with transport chair Has following equipment at home: Wheelchair (power) - with seat height adjustments to access counters and reclining option, Wheelchair (manual), transport WC, shower chair, and Ramped entry, handheld showerhead with rails around toilet, had Michiel Sites but is no longer in need of it, has slide boards x3  PLOF: Requires assistive device for independence, Needs assistance with ADLs, Needs assistance with homemaking, Needs assistance with gait, and Needs assistance with transfers; 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  Used to like to knit, sew and bake.  PATIENT GOALS: Wants to be able to type - currently using advanced Dragon dictation at times but prefers to type at times.  She would like to be able to write better and is interested in resuming some leisure activities such as Archivist and baking.  She also wants to keep working on being able to cut her own food and on her UE strength.   OBJECTIVE:   HAND DOMINANCE: Left  ADLs: Overall ADLs: Patient has a live in caregiver  Transfers/ambulation related to ADLs: Mod assist with sliding board transfers (previously [employer] lift).  Eating: Has a rocker knife that she can use. Used to use adapted utensils but now uses regular utensils but still will get assistance to cut food ie) when eating out.  Grooming: can brush her own hair but unable to manage jewelry ie) earrings  UB Dressing: can zip/unzip after it has been started, unable to manage buttons herself, Caregiver assists but if she has extra time, she can put on her bra, and a loose fitting pullover shirt/t-shirt  LB Dressing: dependent for LB dressing in bed and with special sock donner for LE compression garments   Toileting: bladder trained with suprapubic catheter which she clamps off.   Dependent for bowel incontinence care.  Bathing: Sponge bath with adult washclothes.  Can bath UB with back scrubber for most of her back.  Needs help with feet (mentioned she might need a separate brush for feet)   Tub Shower transfers: Min-mod assist with slide board  Equipment: Shower seat with back, Walk in shower, bed side commode, Reacher, Sock aid, Long handled sponge, and Feeding equipment  IADLs: --  Shopping: Assisted by caregiver  Light housekeeping: Has housekeeper that comes monthly  Meal Prep: previously enjoyed baking. Assisted by caregiver but described recent success at reheating a meal for herself after getting food out of the fridge/freezer from her WC.  Community mobility: Dependent  Medication management: Caregiver sorts them into pillbox but she is very aware of her medications   Financial management: Patient manages her own finances  Handwriting: Increased time and has a pen with a little grip  MOBILITY STATUS: Independent with power mobiity  POSTURE COMMENTS:  No Significant postural limitations and forward head Sitting balance: Supports self independently with both Ues  ACTIVITY TOLERANCE: Activity tolerance: Fair - MMT WFL but has limited sustained tolerance for ongoing use of UEs  FUNCTIONAL OUTCOME MEASURES: 10/20/22 QuickDash 31.8 points   UPPER EXTREMITY ROM:   AROM - WFL without obvious contractures, some digital flexion noted but PROM WNL   AROM Right (eval) Left (eval)  Shoulder flexion [facility]  Shoulder abduction [facility]  Elbow flexion [facility] WFL  Elbow extension [facility]  Wrist flexion [facility] WFL  Wrist extension WFL WFL  Ulnar deviation WFL Decreased ulnar  deviation past midline  Wrist pronation [facility] WFL  Wrist supination [facility]  Digit Composite Flexion Lacks full AROM:   1st digit - 5 cm   3rd digit -1 cm   4th digit - 2 cm  Lacks full AROM:   1st digit - 1.5 cm  5th digit - 3 cm   Digit Composite Extension [facility]  Digit  Opposition Opposition to index finger only Lacks to pinkie due to limited DIP pinkie flexion  (Blank rows = not tested)  UPPER EXTREMITY MMT:   Grossly WFL - Endurance limited R tricep strength > than L but L UE generally stronger than R UE  MMT Right (eval) Left (eval)  Shoulder flexion 4/5 4/5  Shoulder abduction 4/5 4/5  Elbow flexion 4/5 4/5  Elbow extension 4/5 4-/5  (Blank rows = not tested)  HAND FUNCTION: Grip strength: Right: 4.8 lbs; Left: 20.9 lbs 11/17/22 Right - 7.2 lbs  COORDINATION: Finger Nose Finger test: R generally WFL, L WNL Box and Blocks:  Right 28 blocks, Left 37 blocks R hand finger eventually cramps and dexterity worsens in the cold  SENSATION: Light touch: Impaired  - patient   EDEMA: NA for UEs but LE has poor lymph drainage with custom compression garments   MUSCLE TONE: Generally WFL but will assess further  COGNITION: Overall cognitive status: Within functional limits for tasks assessed  VISION: Subjective report: Patent wears progressive lens/glasses.  Denies diplopia or vision changes but has eye exam in the next couple of months. Baseline vision: Wears glasses all the time  VISION ASSESSMENT: WFL  EVALUATION OBSERVATIONS: Patient independent with power WC navigation within clinic.  Patient is well-kept with foley catheter in place.  She has slight limitations in full extension of digits but PROM is WNL and she has splints at home that she said she will bring for OT staff to assess.  She has functional ROM of B UEs to reach her head, behind her back and to cross midline to assist with ADLs.     TODAY'S TREATMENT:                                                                                                                                  - WC management: OTR assisted with some WC assessment for modifications needed.  Patient encouraged to scoot back in her seat to promote improved upright trunk and possibly to consider a lumbar roll to  improve upright posture.  She felt like her armrests are a bit low and when she is sitting upright, they do appear to need to be raised to promote upright trunk.  Also, she is having difficulty with the leg guides (as they get in the way of her joystick).  Pt and caregiver to consider whether leg guide as are needed as they don't touch her legs at this time and hip abduction/external rotation does not seem to be an issue. - Self Care: Patient engaged in problem solving and practicing management of tools in the therapy kitchen today.  Patient encourage to consider translating exercises into functional skills in the kitchen ie) pinches into holding measuring spoons for making banana bread, therapy bar exercises into sustained grasp of handheld mixer (or even hair dryer).  Patient able to hold mixer in her L hand and encouraged to use a stable bowl (ie. slightly heavier bowl) to maintain stability on table, counter or large cutting board across her WC armrests,with possible non-slip surface as needed.  Pt encouraged to space out activity as needed ie) collect and set out ingredients (ie to soften butter); squish bananas etc.  She is also encouraged to consider squishing bananas in ziploc bag to save them for later in case she got tired from task.  Pt and caregiver to use this as her OT homework over the weekend with patient encouraged to count this on her list of daily/weekly activities.   PATIENT EDUCATION: Education details: Setup for cooing activities person educated: Patient and Caregiver Live in caregiver - [name] Education method: Explanation, Demonstration, and Verbal cues,  Education comprehension: verbalized understanding and needs further education   HOME EXERCISE PROGRAM: 11/25/2022: NMES use  12/02/22: All previous HEPs combined to 1 complete List through MedBridge Access Code: [email or web address] URL: [email or web address] Date: 12/02/2022 Prepared by: Amada Kingfisher   GOALS:   SHORT  TERM GOALS: Target date: 11/09/22  Patient will be able to use AE/modified techniques to cut soft foods small enough for oral intake. Baseline: Caregiver/spouse assist Goal status: IN PROGRESS  2.  Patient will be able to use AE/modified positioning to complete word search by drawing lines through words with 0 errors. Baseline: TBD Goal status: NOT MET - Goal discontinued as patient focusing on writing more than word search ie) note taking etc.  3.  Patient will verbalize understanding of good pressure relief schedule (for 15 to 60 seconds every 15 to 60 minutes) to help with wound healing. Baseline: Patient did not change positioning in > 45 minutes of OT eval. Goal status: MET  4.  Patient will be assisted to explore modifications for leisure tasks (knit/sew/bake) with good return demonstration. Baseline: Minimal involvement Goal status: IN PROGRESS  11/17/22 - trialled knitting  12/02/22 - continued AE exploration  5.  Patient will demonstrate independence with HEP for UE strengthening, coordination and ROM to prevent contractures and maintain strength for transfers and ADLs. Baseline: Previous HEPs have been established but need to be reviewed and updated.  Goal status: IN PROGRESS  11/17/22 working on lists and categorizing activities 12/02/22 Comprehensive [employer] list compiled  6.  Patient will be assessed for typing speed/dexterity. Baseline: Patient reports difficulty with typing with all her fingers. Goal status: IN PROGRESS  7. Patient will complete Quick Dash UE assessment. Baseline: TBD Goal Status: 10/20/22 Discontinued - see scores above   LONG  TERM GOALS: Target date: 01/07/23  Patient will complete 1 small craft/week r/t her leisure interests to work on FMS daily. Baseline: Minimal involvement Goal status: IN PROGRESS  2.  Patient will improve B coordination for increased typing speed/dexterity x 1-2 WPM. Baseline: TBD Goal status: IN PROGRESS  11/17/22 daily  engagement with typing at home although modified use of digits for speed  3.  Patient will improve B UE coordination, strength and functional use to bake cookies with setup assistance and AE/modified techniques as appropriate.  Baseline: Not performed. Goal status: IN PROGRESS  4.  Patient will report no more than moderate difficulty using a knife to foods such as chicken, small enough for oral intake using AE and strategies as needed. Baseline: Caregiver/spouse assist Goal status: IN PROGRESS  5.  Patient will be able to use AE/modified positioning to complete 4 sentences with 100% legibility. Baseline: Subjective reports of difficulty with writing Goal status: IN PROGRESS   ASSESSMENT:  CLINICAL IMPRESSION:   Patient is seen today for occupational therapy evaluation for UE deficits due to incomplete quadriplegia. Patient continues to be encouraged to carryover HEP/TE activities in functional tasks ie) cooking/baking, blow drying her hair etc as she remains limited with AROM, coordination and strength for functional daily activities but has an expansive HEP list. Pt continues to benefit from skilled OT services in the outpatient setting to work on impairments and progress functional activities.  PERFORMANCE DEFICITS: in functional skills including ADLs, IADLs, coordination, dexterity, strength, muscle spasms, Fine motor control, Gross motor control, continence, skin integrity, and UE functional use,   IMPAIRMENTS: are limiting patient from ADLs, IADLs, work, and leisure.   CO-MORBIDITIES: has co-morbidities such as incontinence and wound  that affects occupational performance. Patient will benefit from skilled OT to address above impairments and improve overall function.  REHAB POTENTIAL: Fair due to chronicity of injury   PLAN:  OT FREQUENCY: 2x/week  OT DURATION: additional 4 weeks  PLANNED INTERVENTIONS: self care/ADL training, therapeutic exercise, therapeutic activity,  neuromuscular re-education, manual therapy, passive range of motion, balance training, functional mobility training, splinting, patient/family education, energy conservation, coping strategies training, and DME and/or AE instructions  RECOMMENDED OTHER SERVICES: None at this time  CONSULTED AND AGREED WITH PLAN OF CARE: Patient and family member/caregiver  PLAN FOR NEXT SESSION:   Functional kitchen activities.  pizza wheel cutter   Review knitting modifications introduced if patient brings activity from home.  Review positioning of R hand to help with finger opposition and isolation of finger joints.  Continue FM tasks to improve progression of ADLs/IADLS hobby/craft activities (ie baking, yarn activities & baking).   Victorino Sparrow, OT 12/17/2022, 2:25 PM

## 2022-12-17 NOTE — Therapy (Signed)
OUTPATIENT PHYSICAL THERAPY NEURO TREATMENT   Patient Name: MARALYNN PEACHES MRN: [id number] DOB:1951/03/04, 72 y.o., female Today's Date: 12/17/2022   PCP: [name], MD REFERRING PROVIDER: [name], MD   END OF SESSION:  PT End of Session - 12/17/22 1101     Visit Number 18    Number of Visits 25   recert   Date for PT Re-Evaluation 01/26/23   to allow for scheduling delays   Authorization Type [employer] MEDICARE    Progress Note Due on Visit 20    PT Start Time 1100    PT Stop Time 1142    PT Time Calculation (min) 42 min    Equipment Utilized During Treatment Gait belt    Activity Tolerance Patient tolerated treatment well    Behavior During Therapy WFL for tasks assessed/performed                 Past Medical History:  Diagnosis Date   CERVICAL POLYP 03/11/2008   Qualifier: Diagnosis of  By: [name] MD, [name]    Colon polyps 2005   on colonscopy [name]   Fibroid 2004   Per Dr. [name]   History of shingles    face and mouth   Hx of skin cancer, basal cell    Rosacea    Sciatica of left side 09/28/2013   Scoliosis    noted on mri done for back pain   Past Surgical History:  Procedure Laterality Date   BUNIONECTOMY     Patient Active Problem List   Diagnosis Date Noted   Buttock wound, left, initial encounter 11/02/2022   Orthostatic hypotension 08/13/2022   Neurogenic bowel 05/03/2022   Spasticity 05/03/2022   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/03/2022   Nerve pain 05/03/2022   Medication monitoring encounter 01/08/2022   Neurogenic bladder 10/11/2021   Urinary incontinence 10/11/2021   ESBL (extended spectrum beta-lactamase) producing bacteria infection 10/09/2021   Recurrent UTI 10/09/2021   Quadriplegia, C5-C7 incomplete (HCC) 01/16/2021   History of spinal fracture 01/16/2021   Suprapubic catheter (HCC) 01/16/2021   Encounter for routine gynecological examination 09/28/2013   Onychomycosis 09/28/2013   Foot deformity, acquired  03/26/2012   Encounter for preventive health examination 12/25/2010   ROSACEA 08/25/2009   Disturbance in sleep behavior 03/11/2008   SKIN CANCER, HX OF 03/11/2008   DYSURIA, HX OF 03/11/2008   Hyperlipidemia 02/10/2007   CERVICALGIA 02/10/2007    ONSET DATE: 09/28/2022 (most recent referral)  REFERRING DIAG: G82.54 (ICD-10-CM) - Quadriplegia, C5-C7, incomplete (HCC)  THERAPY DIAG:  Muscle weakness (generalized)  Abnormal posture  Other abnormalities of gait and mobility  Quadriplegia, C5-C7 incomplete (HCC)  Rationale for Evaluation and Treatment: Rehabilitation  SUBJECTIVE:  SUBJECTIVE STATEMENT: Pt reports no acute changes since last visit, excited to try standing in // bars today.  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 Yes: NPRS scale: 3/10 Pain location: forearms to fingertips Pain description: constant, pinprick/tingling Aggravating factors: nighttime Relieving factors: nothing, sometimes medicines  PRECAUTIONS: Fall; suprapubic catheter (she wants to get this removed meaning she needs to get to and from the toilet); she has had minor heat sensation when needing to complete her bowel program-possible AD?  WEIGHT BEARING RESTRICTIONS: No-pt was in stander at most recent therapy in [place] last week.  She will have a bone density done soon w/ Dr. [name].  FALLS: Has patient fallen in last 6 months? No  LIVING ENVIRONMENT: Lives with: lives with their spouse and live-in nurse [name] Lives in: House/apartment-townhouse Stairs: No-level entry, but multi-level home 4 stories w/ elevator Has following equipment at home: Wheelchair (power), Wheelchair  (manual), Grab bars, and standing frame, sliding board, transport shower chair, handheld shower head, leg strap-pt reports she no longer finds this helpful  PLOF: Requires assistive device for independence, Needs assistance with ADLs, Needs assistance with homemaking, and Needs assistance with transfers  OCCUPATION:  Writer-uses Dragon to dictate  PATIENT GOALS: "Make my right leg work."  Stand and pivot so she can more easily access a toilet.  OBJECTIVE:   DIAGNOSTIC FINDINGS: No recent relevant imaging.  Bone density scheduled 10/15/2022.  COGNITION: Overall cognitive status: Within functional limits for tasks assessed   SENSATION: Light touch: Diminished ability to distinguish sharp and dull, but able to distinguish light touch from injury level down accurately  COORDINATION: Not formally assessed.  EDEMA:  Well managed w/ lymphatic massage and compression stockings.  MUSCLE TONE: Pt has intermittent clonus during transfers.  POSTURE: rounded shoulders, posterior pelvic tilt, and right thoracic scoliosis   LOWER EXTREMITY ROM:     Passive  Right 10/20/22 Left 10/20/22  Hip flexion [facility]  Hip extension    Hip abduction    Hip adduction    Hip internal rotation [facility] WFL  Hip external rotation [facility]  Knee flexion [facility] WFL  Knee extension [facility] WFL  Ankle dorsiflexion Slight PF contracture Slight PF contracture  Ankle plantarflexion    Ankle inversion    Ankle eversion     (Blank rows = not tested)    LOWER EXTREMITY MMT:    MMT Right 10/20/22 Left 10/20/22  Hip flexion 1 2+  Hip extension    Hip abduction    Hip adduction    Hip internal rotation    Hip external rotation    Knee flexion 2- 3  Knee extension 2- 3  Ankle dorsiflexion 0 3  Ankle plantarflexion    Ankle inversion    Ankle eversion     (Blank rows = not tested)   BED MOBILITY:  Sit to supine Mod A Supine to sit Mod A Rolling to Right Mod A Rolling to Left Mod A Undulating mattress for wound  management on standard bed (elevated-so often doing uphill sliding board transfers); she would like to continue working on sitting up independently, she has been working on rolling, needs less assistance w/ this when someone props her leg into hooklying; would like something to help her pull her left leg to her butt for stretching as well as bed mobility.  FUNCTIONAL TESTS:  None  relevant to pt's current functional level and abilities.  PATIENT SURVEYS:  None completed due to time.  TODAY'S TREATMENT:        NMR: Pt received seated in PWC, session focus on standing in // bars with skilled +2. Assisted pt with donning B knee immobilizers to aid in LE control in standing. Pt performs sit to stand x 3 reps (initially with min A x 2 for first repetition, min A x 1 for 2nd, and min to mod A x 2 for 3rd). Pt requires up to min A x 2 for static standing balance in // bars with heavy UE reliance. Pt is able to progress to just needing CGA in standing. Pt able to tolerate standing x 2 min each repetition. Pt performs lateral weight shifting L/R in standing with some assist needed to extend R knee due to weakness, tactile cues for glute activation. Pt does feel slightly lighteaded after 3rd stand but it resolves with seated rest break, no other issues or symptoms of OH during session. Pt dependent to move LE back under her in standing to have weight directly over her lower limbs in standing. Pt returned to sitting in PWC at end of session.   PATIENT EDUCATION: Education details: benefits of standing, progression of standing from performance this session Person educated: Patient and Caregiver [name] Education method: Explanation Education comprehension: verbalized understanding  HOME EXERCISE PROGRAM: Will be established as needed as pt has done continuous therapy and is working towards functional tasks.  GOALS: Goals reviewed with patient? Yes   NEW SHORT TERM GOALS=LONG TERM GOALS due to length of  POC   NEW LONG TERM GOALS:  Target date: 01/12/2023  Pt will perform squat pivot transfer w/ no more than modA in order to improve access to home environment for toilet transfers. Baseline: minA for lateral scoot transfer, pt able to clear bottom intermittently; min A for "bump" transfer with assist just needed for LE management (7/2) Goal status: IN PROGRESS  2.  Pt will report wound healing in order to return to day program at [facility]. Baseline:  Ischial wound, pt reports wound is healing but does have tunneling so she continues to utilize a wound vac and wound care services (7/2) Goal status: IN PROGRESS  3.  Pt will perform sit to stand transfer with no more than mod A with LRAD to demonstrate improved capacity for higher level transfers and access to uneven surfaces Baseline: max A to stand to stedy as of 7/2 Goal status: IN PROGRESS  4.  Pt will demonstrate ability to place slide board independently in preparation for slide board transfers Baseline: dependent for placement (7/2) Goal status: INITIAL  5.  Pt and caregiver will demonstrate independence with use of patient's personal NMES device for safe muscle activation and strengthening in her home environment Baseline: min cueing for safe setup (7/2) Goal status: INITIAL    ASSESSMENT:  CLINICAL IMPRESSION: Emphasis of skilled PT session on initiating standing in // bars with skilled +2 and use of B knee immobilizers. Pt exhibits good core control in standing and can stand with up to CGA x 2 at times. Pt does utilize heavy UE reliance on // bars in standing and needs R knee blocked due to buckling and difficulty performing quad activation. Pt can benefit from continued trials of standing in // bars during therapy sessions to work towards increasing her tolerance for standing and increasing her independence with this to work towards her LTG of performing a  stand pivot transfer. Continue POC.   OBJECTIVE IMPAIRMENTS:  decreased balance, decreased coordination, decreased mobility, difficulty walking, decreased ROM, decreased strength, hypomobility, increased edema, impaired flexibility, impaired sensation, impaired tone, impaired UE functional use, improper body mechanics, postural dysfunction, and pain.   ACTIVITY LIMITATIONS: carrying, lifting, bending, sitting, standing, squatting, stairs, transfers, bed mobility, continence, bathing, toileting, dressing, reach over head, hygiene/grooming, locomotion level, and caring for others  PARTICIPATION LIMITATIONS: meal prep, cleaning, laundry, driving, and community activity  PERSONAL FACTORS: Age, Fitness, Past/current experiences, Time since onset of injury/illness/exacerbation, and 1-2 comorbidities: intermittent AD, neurogenic bowel/bladder on active bladder training  are also affecting patient's functional outcome.   REHAB POTENTIAL: Good  CLINICAL DECISION MAKING: Evolving/moderate complexity  EVALUATION COMPLEXITY: Moderate  PLAN:  PT FREQUENCY: 2x/week  PT DURATION: 8 weeks + 4 weeks (recert)  PLANNED INTERVENTIONS: Therapeutic exercises, Therapeutic activity, Neuromuscular re-education, Balance training, Patient/Family education, Self Care, DME instructions, Electrical stimulation, Wheelchair mobility training, Manual therapy, and Re-evaluation  PLAN FOR NEXT SESSION:  NMES to LE (making sure pt and [name] know how to set it up, where to place electrodes, etc), Core strength, static sitting balance-perturbations.  Least restrictive standing--can progress to stedy vs in // bars if skilled +2 available, use of powderboard on mat table for RLE strengthening, wants to work on increasing independence with lateral scoot transfers without SB and also wants to be able to place SB independently and move her LE more independently; use of leg loops/lifters to increase independence with LE management with transfers and bed mobility if pt is willing to use AE, standing  in // bars with knees blocked or knee immobilizers to assess standing without stedy, did she purchase Vive patient turner?, work towards discussion of PT POC  Peter Congo, PT Peter Congo, PT, DPT, CSRS   12/17/2022, 11:43 AM

## 2022-12-20 ENCOUNTER — Encounter (HOSPITAL_BASED_OUTPATIENT_CLINIC_OR_DEPARTMENT_OTHER): Payer: Medicare PPO | Attending: Internal Medicine | Admitting: Internal Medicine

## 2022-12-20 ENCOUNTER — Ambulatory Visit: Payer: Medicare PPO | Admitting: Occupational Therapy

## 2022-12-20 ENCOUNTER — Encounter: Payer: Self-pay | Admitting: Physical Therapy

## 2022-12-20 ENCOUNTER — Ambulatory Visit: Payer: Medicare PPO | Admitting: Physical Therapy

## 2022-12-20 DIAGNOSIS — R2689 Other abnormalities of gait and mobility: Secondary | ICD-10-CM | POA: Diagnosis not present

## 2022-12-20 DIAGNOSIS — R293 Abnormal posture: Secondary | ICD-10-CM

## 2022-12-20 DIAGNOSIS — R278 Other lack of coordination: Secondary | ICD-10-CM | POA: Diagnosis not present

## 2022-12-20 DIAGNOSIS — T798XXA Other early complications of trauma, initial encounter: Secondary | ICD-10-CM | POA: Diagnosis not present

## 2022-12-20 DIAGNOSIS — M6281 Muscle weakness (generalized): Secondary | ICD-10-CM

## 2022-12-20 DIAGNOSIS — G8250 Quadriplegia, unspecified: Secondary | ICD-10-CM | POA: Diagnosis not present

## 2022-12-20 DIAGNOSIS — L89323 Pressure ulcer of left buttock, stage 3: Secondary | ICD-10-CM | POA: Insufficient documentation

## 2022-12-20 DIAGNOSIS — M24541 Contracture, right hand: Secondary | ICD-10-CM | POA: Diagnosis not present

## 2022-12-20 DIAGNOSIS — M24542 Contracture, left hand: Secondary | ICD-10-CM | POA: Diagnosis not present

## 2022-12-20 DIAGNOSIS — G8254 Quadriplegia, C5-C7 incomplete: Secondary | ICD-10-CM | POA: Diagnosis not present

## 2022-12-20 DIAGNOSIS — R29818 Other symptoms and signs involving the nervous system: Secondary | ICD-10-CM | POA: Diagnosis not present

## 2022-12-20 DIAGNOSIS — R29898 Other symptoms and signs involving the musculoskeletal system: Secondary | ICD-10-CM | POA: Diagnosis not present

## 2022-12-20 NOTE — Therapy (Signed)
OUTPATIENT OCCUPATIONAL THERAPY NEURO TREATMENT NOTE Patient Name: [name] MRN: [id number] DOB:11-10-1950, 72 y.o., female Today's Date: 12/20/2022  PCP: [name], MD  REFERRING PROVIDER: [name], MD  END OF SESSION:  OT End of Session - 12/20/22 1535     Visit Number 19    Number of Visits 25    Date for OT Re-Evaluation 01/07/23    Authorization Type [employer] - requires auth, [place] (additional auth requested)    Authorization Time Period 10/12/22 - 12/10/22    Authorization - Number of Visits 17    Progress Note Due on Visit 26    OT Start Time 1534    OT Stop Time 1617    OT Time Calculation (min) 43 min    Equipment Utilized During Treatment Kitchen Tools    Activity Tolerance Patient tolerated treatment well    Behavior During Therapy [facility] for tasks assessed/performed              Past Medical History:  Diagnosis Date   CERVICAL POLYP 03/11/2008   Qualifier: Diagnosis of  By: [name] MD, [name]    Colon polyps 2005   on colonscopy [name]   Fibroid 2004   Per Dr. [name]   History of shingles    face and mouth   Hx of skin cancer, basal cell    Rosacea    Sciatica of left side 09/28/2013   Scoliosis    noted on mri done for back pain   Past Surgical History:  Procedure Laterality Date   BUNIONECTOMY     Patient Active Problem List   Diagnosis Date Noted   Buttock wound, left, initial encounter 11/02/2022   Orthostatic hypotension 08/13/2022   Neurogenic bowel 05/03/2022   Spasticity 05/03/2022   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/03/2022   Nerve pain 05/03/2022   Medication monitoring encounter 01/08/2022   Neurogenic bladder 10/11/2021   Urinary incontinence 10/11/2021   ESBL (extended spectrum beta-lactamase) producing bacteria infection 10/09/2021   Recurrent UTI 10/09/2021   Quadriplegia, C5-C7 incomplete (HCC) 01/16/2021   History of spinal fracture 01/16/2021   Suprapubic catheter (HCC) 01/16/2021   Encounter for routine  gynecological examination 09/28/2013   Onychomycosis 09/28/2013   Foot deformity, acquired 03/26/2012   Encounter for preventive health examination 12/25/2010   ROSACEA 08/25/2009   Disturbance in sleep behavior 03/11/2008   SKIN CANCER, HX OF 03/11/2008   DYSURIA, HX OF 03/11/2008   Hyperlipidemia 02/10/2007   CERVICALGIA 02/10/2007    ONSET DATE: 07/28/2020  Date of Referral 09/28/22  REFERRING DIAG: G82.54 (ICD-10-CM) - Quadriplegia, C5-C7, incomplete (HCC)  THERAPY DIAG:  Other lack of coordination  Muscle weakness (generalized)  Rationale for Evaluation and Treatment: Rehabilitation  SUBJECTIVE:   SUBJECTIVE STATEMENT:   Patient reports that her wound vac on pause s/p MD appt earlier today.  They are not definitely getting rid of it yet but it is off until she returns to the doctor for her next follow up.   Pt accompanied by:  Live in Caregiver - [name]   PERTINENT HISTORY: "Pt is a 72 yr old L handed female with hx of incomplete quadriplegia- 2/14 2022- fleeing the police in [place] on passenger 100 (high speed) miles/hour,  [name]; neurogenic bowel and bladder and spasticity; [name], [name] Here for f/u on Incomplete quadriplegia"  Referral from MD 09/28/22 states, "Please eval and treat for ADLs and higher level mobility."  PRECAUTIONS: Fall; suprapubic catheter (she wants to  get this removed meaning she needs to get to and from the toilet); she has had minor heat sensation when needing to complete her bowel program-possible AD?   WEIGHT BEARING RESTRICTIONS: No-pt was in stander at most recent therapy in [place] last week.  She will have a bone density done soon w/ Dr. [name].   PAIN:  Patient reports chronic pain 3/10 fingers to elbow bilaterally managed by medication and will inform therapy staff of any changes.  FALLS: Has patient fallen in last 6 months? No  LIVING ENVIRONMENT: Lives with: lives with their family - husband [name] and with  an adult companion s/p moving back up from [place] x10 months Lives in: House/apartment Stairs:  4 story town house with an Engineer, structural with threshold adjustments, roll in shower with transport chair Has following equipment at home: Wheelchair (power) - with seat height adjustments to access counters and reclining option, Wheelchair (manual), transport WC, shower chair, and Ramped entry, handheld showerhead with rails around toilet, had Michiel Sites but is no longer in need of it, has slide boards x3  PLOF: Requires assistive device for independence, Needs assistance with ADLs, Needs assistance with homemaking, Needs assistance with gait, and Needs assistance with transfers; 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  Used to like to knit, sew and bake.  PATIENT GOALS: Wants to be able to type - currently using advanced Dragon dictation at times but prefers to type at times.  She would like to be able to write better and is interested in resuming some leisure activities such as Archivist and baking.  She also wants to keep working on being able to cut her own food and on her UE strength.   OBJECTIVE:   HAND DOMINANCE: Left  ADLs: Overall ADLs: Patient has a live in caregiver  Transfers/ambulation related to ADLs: Mod assist with sliding board transfers (previously [employer] lift).  Eating: Has a rocker knife that she can use. Used to use adapted utensils but now uses regular utensils but still will get assistance to cut food ie) when eating out.  Grooming: can brush her own hair but unable to manage jewelry ie) earrings  UB Dressing: can zip/unzip after it has been started, unable to manage buttons herself, Caregiver assists but if she has extra time, she can put on her bra, and a loose fitting pullover shirt/t-shirt  LB Dressing: dependent for LB dressing in bed and with special sock donner for LE compression garments   Toileting: bladder trained with suprapubic catheter which she clamps off.   Dependent for bowel incontinence care.  Bathing: Sponge bath with adult washclothes.  Can bath UB with back scrubber for most of her back.  Needs help with feet (mentioned she might need a separate brush for feet)   Tub Shower transfers: Min-mod assist with slide board  Equipment: Shower seat with back, Walk in shower, bed side commode, Reacher, Sock aid, Long handled sponge, and Feeding equipment  IADLs: --  Shopping: Assisted by caregiver  Light housekeeping: Has housekeeper that comes monthly  Meal Prep: previously enjoyed baking. Assisted by caregiver but described recent success at reheating a meal for herself after getting food out of the fridge/freezer from her WC.  Community mobility: Dependent  Medication management: Caregiver sorts them into pillbox but she is very aware of her medications   Financial management: Patient manages her own finances  Handwriting: Increased time and has a pen with a little grip  MOBILITY STATUS: Independent with power [employer]  POSTURE COMMENTS:  No Significant postural limitations and forward head Sitting balance: Supports self independently with both Ues  ACTIVITY TOLERANCE: Activity tolerance: Fair - MMT WFL but has limited sustained tolerance for ongoing use of UEs  FUNCTIONAL OUTCOME MEASURES: 10/20/22 QuickDash 31.8 points   UPPER EXTREMITY ROM:   AROM - WFL without obvious contractures, some digital flexion noted but PROM WNL   AROM Right (eval) Left (eval)  Shoulder flexion [facility]  Shoulder abduction [facility]  Elbow flexion [facility] WFL  Elbow extension [facility]  Wrist flexion [facility] WFL  Wrist extension WFL WFL  Ulnar deviation WFL Decreased ulnar  deviation past midline  Wrist pronation [facility] WFL  Wrist supination [facility]  Digit Composite Flexion Lacks full AROM:   1st digit - 5 cm   3rd digit -1 cm   4th digit - 2 cm  Lacks full AROM:   1st digit - 1.5 cm  5th digit - 3 cm   Digit Composite Extension [facility]  Digit  Opposition Opposition to index finger only Lacks to pinkie due to limited DIP pinkie flexion  (Blank rows = not tested)  UPPER EXTREMITY MMT:   Grossly WFL - Endurance limited R tricep strength > than L but L UE generally stronger than R UE  MMT Right (eval) Left (eval)  Shoulder flexion 4/5 4/5  Shoulder abduction 4/5 4/5  Elbow flexion 4/5 4/5  Elbow extension 4/5 4-/5  (Blank rows = not tested)  HAND FUNCTION: Grip strength: Right: 4.8 lbs; Left: 20.9 lbs 11/17/22 Right - 7.2 lbs  COORDINATION: Finger Nose Finger test: R generally WFL, L WNL Box and Blocks:  Right 28 blocks, Left 37 blocks R hand finger eventually cramps and dexterity worsens in the cold  SENSATION: Light touch: Impaired  - patient   EDEMA: NA for UEs but LE has poor lymph drainage with custom compression garments   MUSCLE TONE: Generally WFL but will assess further  COGNITION: Overall cognitive status: Within functional limits for tasks assessed  VISION: Subjective report: Patent wears progressive lens/glasses.  Denies diplopia or vision changes but has eye exam in the next couple of months. Baseline vision: Wears glasses all the time  VISION ASSESSMENT: WFL  EVALUATION OBSERVATIONS: Patient independent with power WC navigation within clinic.  Patient is well-kept with foley catheter in place.  She has slight limitations in full extension of digits but PROM is WNL and she has splints at home that she said she will bring for OT staff to assess.  She has functional ROM of B UEs to reach her head, behind her back and to cross midline to assist with ADLs.     TODAY'S TREATMENT:                                                                                                                           Therapeutic Activities: Patient engaged in activities to assess cutting with plastic pizza wheel cutter with solid  handle.  Patient is able to use putty to cut putty greater than 20 times including 15 times in a  row with left upper extremity.  She is also able to complete task with right upper extremity placing her left hand on top to stabilize.  Patient did like the device trialed as it is plastic and decreases her worry of injury.  Remainder of session focused on modified techniques for knitting.  Patient got a yarn ring and various options were trialed including positioning of the ring to the side or back of her finger, positioning of the yarn through different aspects of the looping system, loosening and tightening aspects of the ring for improved fit and stability on her finger, as well has stabilization of her R hand knitting needle.  Patient continues to benefit from stabilization of knitting needle in her lap and is able to position tennis ball (holding the end of the knitting needle) between her legs.  Patient does require assistance to move the knitting needle from her left hand into the ball when she finishes her row.  Patient would require assistance from caregiver to do the same as the tennis ball does hold it firmly in place.  With respect to the tension on the yarn, she found occasional success with use of yarn ring but not consistent success as the ring would move, the yarn might get caught and she still needed to pinch the yarn with her L fingers at times to loop it around the knitting needle.   PATIENT EDUCATION: Education details: Modifications for knitting person educated: Patient and Caregiver Live in caregiver - [name] Education method: Explanation, Demonstration, and Verbal cues,  Education comprehension: verbalized understanding and needs further education   HOME EXERCISE PROGRAM: 11/25/2022: NMES use  12/02/22: All previous HEPs combined to 1 complete List through MedBridge Access Code: [email or web address] URL: [email or web address] Date: 12/02/2022 Prepared by: Amada Kingfisher   GOALS:   SHORT TERM GOALS: Target date: 11/09/22  Patient will be able to use AE/modified techniques  to cut soft foods small enough for oral intake. Baseline: Caregiver/spouse assist Goal status: IN PROGRESS  2.  Patient will be able to use AE/modified positioning to complete word search by drawing lines through words with 0 errors. Baseline: TBD Goal status: NOT MET - Goal discontinued as patient focusing on writing more than word search ie) note taking etc.  3.  Patient will verbalize understanding of good pressure relief schedule (for 15 to 60 seconds every 15 to 60 minutes) to help with wound healing. Baseline: Patient did not change positioning in > 45 minutes of OT eval. Goal status: MET  4.  Patient will be assisted to explore modifications for leisure tasks (knit/sew/bake) with good return demonstration. Baseline: Minimal involvement Goal status: IN PROGRESS  11/17/22 - trialled knitting  12/02/22 & 12/20/22 - continued AE exploration  5.  Patient will demonstrate independence with HEP for UE strengthening, coordination and ROM to prevent contractures and maintain strength for transfers and ADLs. Baseline: Previous HEPs have been established but need to be reviewed and updated.  Goal status: IN PROGRESS  11/17/22 working on lists and categorizing activities 12/02/22 Comprehensive Med[employer] list compiled  6.  Patient will be assessed for typing speed/dexterity. Baseline: Patient reports difficulty with typing with all her fingers. Goal status: IN PROGRESS  7. Patient will complete Quick Dash UE assessment. Baseline: TBD Goal Status: 10/20/22 Discontinued - see scores above   LONG TERM GOALS: Target date: 01/07/23  Patient will complete 1  small craft/week r/t her leisure interests to work on FMS daily. Baseline: Minimal involvement Goal status: IN PROGRESS  2.  Patient will improve B coordination for increased typing speed/dexterity x 1-2 WPM. Baseline: TBD Goal status: IN PROGRESS  11/17/22 daily engagement with typing at home although modified use of digits for speed  3.   Patient will improve B UE coordination, strength and functional use to bake cookies with setup assistance and AE/modified techniques as appropriate.  Baseline: Not performed. Goal status: IN PROGRESS  4.  Patient will report no more than moderate difficulty using a knife to foods such as chicken, small enough for oral intake using AE and strategies as needed. Baseline: Caregiver/spouse assist Goal status: IN PROGRESS  5.  Patient will be able to use AE/modified positioning to complete 4 sentences with 100% legibility. Baseline: Subjective reports of difficulty with writing Goal status: IN PROGRESS   ASSESSMENT:  CLINICAL IMPRESSION:   Patient continues to be seen for occupational therapy evaluation for UE deficits due to incomplete quadriplegia. Patient continues to be assisted to problem solve solutions to IADLS and hobby activities with AE to maximize functional abilities in light of ongoing limitations with AROM, coordination and strength for functional daily activities. Pt continues to benefit from skilled OT services in the outpatient setting to work on impairments and progress functional activities.  PERFORMANCE DEFICITS: in functional skills including ADLs, IADLs, coordination, dexterity, strength, muscle spasms, Fine motor control, Gross motor control, continence, skin integrity, and UE functional use,   IMPAIRMENTS: are limiting patient from ADLs, IADLs, work, and leisure.   CO-MORBIDITIES: has co-morbidities such as incontinence and wound  that affects occupational performance. Patient will benefit from skilled OT to address above impairments and improve overall function.  REHAB POTENTIAL: Fair due to chronicity of injury   PLAN:  OT FREQUENCY: 2x/week  OT DURATION: additional 4 weeks  PLANNED INTERVENTIONS: self care/ADL training, therapeutic exercise, therapeutic activity, neuromuscular re-education, manual therapy, passive range of motion, balance training, functional  mobility training, splinting, patient/family education, energy conservation, coping strategies training, and DME and/or AE instructions  RECOMMENDED OTHER SERVICES: None at this time  CONSULTED AND AGREED WITH PLAN OF CARE: Patient and family member/caregiver  PLAN FOR NEXT SESSION:   Review knitting modifications with yarn ring as patient brings activity from home.  Review positioning of R hand to help with finger opposition and isolation of finger joints.  Continue FM tasks to improve progression of ADLs/IADLS hobby/craft activities (ie baking, yarn activities & baking).   [employer] options for plastic pizza wheel cutter.  Victorino Sparrow, OT 12/20/2022, 4:47 PM
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
## 2022-12-20 NOTE — Therapy (Signed)
OUTPATIENT PHYSICAL THERAPY NEURO TREATMENT   Patient Name: [name] MRN: [id number] DOB:October 10, 1950, 72 y.o., female Today's Date: 12/20/2022   PCP: [name], MD REFERRING PROVIDER: [name], MD   END OF SESSION:  PT End of Session - 12/20/22 1613     Visit Number 19    Number of Visits 25   recert   Date for PT Re-Evaluation 01/26/23   to allow for scheduling delays   Authorization Type [employer] MEDICARE    Progress Note Due on Visit 20    PT Start Time 1621    PT Stop Time 1710    PT Time Calculation (min) 49 min    Equipment Utilized During Treatment Gait belt    Activity Tolerance Patient tolerated treatment well;Patient limited by fatigue    Behavior During Therapy [facility] for tasks assessed/performed                 Past Medical History:  Diagnosis Date   CERVICAL POLYP 03/11/2008   Qualifier: Diagnosis of  By: [name] MD, [name]    Colon polyps 2005   on colonscopy [name]   Fibroid 2004   Per Dr. [name]   History of shingles    face and mouth   Hx of skin cancer, basal cell    Rosacea    Sciatica of left side 09/28/2013   Scoliosis    noted on mri done for back pain   Past Surgical History:  Procedure Laterality Date   BUNIONECTOMY     Patient Active Problem List   Diagnosis Date Noted   Buttock wound, left, initial encounter 11/02/2022   Orthostatic hypotension 08/13/2022   Neurogenic bowel 05/03/2022   Spasticity 05/03/2022   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/03/2022   Nerve pain 05/03/2022   Medication monitoring encounter 01/08/2022   Neurogenic bladder 10/11/2021   Urinary incontinence 10/11/2021   ESBL (extended spectrum beta-lactamase) producing bacteria infection 10/09/2021   Recurrent UTI 10/09/2021   Quadriplegia, C5-C7 incomplete (HCC) 01/16/2021   History of spinal fracture 01/16/2021   Suprapubic catheter (HCC) 01/16/2021   Encounter for routine gynecological examination 09/28/2013   Onychomycosis 09/28/2013   Foot  deformity, acquired 03/26/2012   Encounter for preventive health examination 12/25/2010   ROSACEA 08/25/2009   Disturbance in sleep behavior 03/11/2008   SKIN CANCER, HX OF 03/11/2008   DYSURIA, HX OF 03/11/2008   Hyperlipidemia 02/10/2007   CERVICALGIA 02/10/2007    ONSET DATE: 09/28/2022 (most recent referral)  REFERRING DIAG: G82.54 (ICD-10-CM) - Quadriplegia, C5-C7, incomplete (HCC)  THERAPY DIAG:  Other lack of coordination  Muscle weakness (generalized)  Abnormal posture  Other abnormalities of gait and mobility  Rationale for Evaluation and Treatment: Rehabilitation  SUBJECTIVE:  SUBJECTIVE STATEMENT: Pt reports no acute changes since last visit.  She was pretty tired after last session, but did get in the stander for 45 minutes over the weekend and tolerated this well.  She is getting [employer] to come back out and raise the arm rests on her new wheelchair when they come to pick up old chair.  The tunnel of her wound is closed and she has granulation tissue over the top of the wound.  She is done with the wound vac and has to have new dressings every day.  She is going to communicate with [facility] regarding wound and wait until completely healed before heading to them.  She may need a new referral for them per report so she will call Dr. Berline Chough.  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 Yes: NPRS scale: 3/10 Pain location: forearms to fingertips Pain description: constant, pinprick/tingling Aggravating factors: nighttime Relieving factors: nothing, sometimes medicines  PRECAUTIONS: Fall; suprapubic catheter (she wants to get this removed meaning she  needs to get to and from the toilet); she has had minor heat sensation when needing to complete her bowel program-possible AD?  WEIGHT BEARING RESTRICTIONS: No-pt was in stander at most recent therapy in [place] last week.  She will have a bone density done soon w/ Dr. [name].  FALLS: Has patient fallen in last 6 months? No  LIVING ENVIRONMENT: Lives with: lives with their spouse and live-in nurse [name] Lives in: House/apartment-townhouse Stairs: No-level entry, but multi-level home 4 stories w/ elevator Has following equipment at home: Wheelchair (power), Wheelchair (manual), Grab bars, and standing frame, sliding board, transport shower chair, handheld shower head, leg strap-pt reports she no longer finds this helpful  PLOF: Requires assistive device for independence, Needs assistance with ADLs, Needs assistance with homemaking, and Needs assistance with transfers  OCCUPATION:  Writer-uses Dragon to dictate  PATIENT GOALS: "Make my right leg work."  Stand and pivot so she can more easily access a toilet.  OBJECTIVE:   DIAGNOSTIC FINDINGS: No recent relevant imaging.  Bone density scheduled 10/15/2022.  COGNITION: Overall cognitive status: Within functional limits for tasks assessed   SENSATION: Light touch: Diminished ability to distinguish sharp and dull, but able to distinguish light touch from injury level down accurately  COORDINATION: Not formally assessed.  EDEMA:  Well managed w/ lymphatic massage and compression stockings.  MUSCLE TONE: Pt has intermittent clonus during transfers.  POSTURE: rounded shoulders, posterior pelvic tilt, and right thoracic scoliosis   LOWER EXTREMITY ROM:     Passive  Right 10/20/22 Left 10/20/22  Hip flexion [facility]  Hip extension    Hip abduction    Hip adduction    Hip internal rotation [facility]  Hip external rotation [facility]  Knee flexion [facility]  Knee extension [facility]  Ankle dorsiflexion Slight PF contracture Slight PF  contracture  Ankle plantarflexion    Ankle inversion    Ankle eversion     (Blank rows = not tested)    LOWER EXTREMITY MMT:    MMT Right 10/20/22 Left 10/20/22  Hip flexion 1 2+  Hip extension    Hip abduction    Hip adduction    Hip internal rotation    Hip external rotation    Knee flexion 2- 3  Knee extension 2- 3  Ankle dorsiflexion 0 3  Ankle plantarflexion    Ankle inversion    Ankle eversion     (Blank rows = not tested)   BED MOBILITY:  Sit to supine Mod A Supine to sit Mod A Rolling to Right Mod A Rolling to Left Mod A Undulating mattress for wound management on standard bed (elevated-so often doing uphill sliding board transfers); she would like to continue working on sitting up independently, she has been working on rolling, needs less assistance w/ this when someone props her leg into hooklying; would like something to help her pull her left leg to her butt for stretching as well as bed mobility.  FUNCTIONAL TESTS:  None relevant to pt's current functional level and abilities.  PATIENT SURVEYS:  None completed due to time.  TODAY'S TREATMENT:        BP assessed w/ pt sitting EOM.  Initial reading was inaccurate so retaken on LUE w/ reading of 108/56, HR 80 bpm.  Attempted standing w/ pull-to-stand in Stedy x2.  On initial stand pt feeling positioning was not erect enough so PT had pt sit and corrected knee position in stander.  Patient also required x2 airex pads under bottom using partial stand to place due to mat table being unable to accommodate height needed to correct hip positioning.  She is able to stand once from this setup using pull-to-stand, but lacking hip extension noted when in EZ stander.  She reports excess fatigue today due to later visit time than normal so regressed to supine with minA for LE management.  PT performs PROM and gentle stretching to BLE including hamstrings, gastroc, and hip IR/ER and gluts, careful not to overstretch which is aided  today by notable increased tone (flexor tone in left hamstring and RLE clonus).  She demonstrates some improvement in hypertonicity by end of task.  Returning to [facility] via slideboard to right w/ minA for LE adjustment midway through.  PATIENT EDUCATION: Education details: Discussed trying stander at earlier morning appts. Person educated: Patient and Arts administrator Education method: Explanation Education comprehension: verbalized understanding  HOME EXERCISE PROGRAM: Will be established as needed as pt has done continuous therapy and is working towards functional tasks.  GOALS: Goals reviewed with patient? Yes   NEW SHORT TERM GOALS=LONG TERM GOALS due to length of POC   NEW LONG TERM GOALS:  Target date: 01/12/2023  Pt will perform squat pivot transfer w/ no more than modA in order to improve access to home environment for toilet transfers. Baseline: minA for lateral scoot transfer, pt able to clear bottom intermittently; min A for "bump" transfer with assist just needed for LE management (7/2) Goal status: IN PROGRESS  2.  Pt will report wound healing in order to return to day program at [facility]. Baseline:  Ischial wound, pt reports wound is healing but does have tunneling so she continues to utilize a wound vac and wound care services (7/2) Goal status: IN PROGRESS  3.  Pt will perform sit to stand transfer with no more than mod A with LRAD to demonstrate improved capacity for higher level transfers and access to uneven surfaces Baseline: max A to stand to stedy as of 7/2 Goal status: IN PROGRESS  4.  Pt will demonstrate ability to place slide board independently in preparation for slide board transfers Baseline: dependent for placement (7/2) Goal status: INITIAL  5.  Pt and caregiver will demonstrate independence with use of patient's personal NMES device for safe muscle activation and strengthening in her home  environment Baseline: min cueing for safe setup (7/2) Goal  status: INITIAL    ASSESSMENT:  CLINICAL IMPRESSION: Attempted stander today without great success due to fatigue.  Regressed to PROM and stretching to manage hypertonicity likely due to additional fatigue and time of day for patient.  Will attempt stander again to further facilitate weight bearing and core engagement as appropriate at next session.  OBJECTIVE IMPAIRMENTS: decreased balance, decreased coordination, decreased mobility, difficulty walking, decreased ROM, decreased strength, hypomobility, increased edema, impaired flexibility, impaired sensation, impaired tone, impaired UE functional use, improper body mechanics, postural dysfunction, and pain.   ACTIVITY LIMITATIONS: carrying, lifting, bending, sitting, standing, squatting, stairs, transfers, bed mobility, continence, bathing, toileting, dressing, reach over head, hygiene/grooming, locomotion level, and caring for others  PARTICIPATION LIMITATIONS: meal prep, cleaning, laundry, driving, and community activity  PERSONAL FACTORS: Age, Fitness, Past/current experiences, Time since onset of injury/illness/exacerbation, and 1-2 comorbidities: intermittent AD, neurogenic bowel/bladder on active bladder training  are also affecting patient's functional outcome.   REHAB POTENTIAL: Good  CLINICAL DECISION MAKING: Evolving/moderate complexity  EVALUATION COMPLEXITY: Moderate  PLAN:  PT FREQUENCY: 2x/week  PT DURATION: 8 weeks + 4 weeks (recert)  PLANNED INTERVENTIONS: Therapeutic exercises, Therapeutic activity, Neuromuscular re-education, Balance training, Patient/Family education, Self Care, DME instructions, Electrical stimulation, Wheelchair mobility training, Manual therapy, and Re-evaluation  PLAN FOR NEXT SESSION:  PROGRESS NOTE!  NMES to LE (making sure pt and [name] know how to set it up, where to place electrodes, etc), Core strength, static sitting balance-perturbations.  Least restrictive standing--can progress to stedy  vs in // bars if skilled +2 available, use of powderboard on mat table for RLE strengthening, wants to work on increasing independence with lateral scoot transfers without SB and also wants to be able to place SB independently and move her LE more independently; use of leg loops/lifters to increase independence with LE management with transfers and bed mobility if pt is willing to use AE, standing in // bars with knees blocked or knee immobilizers to assess standing without stedy, did she purchase Vive patient turner?, work towards discussion of PT POC  Sadie Haber, PT, DPT  12/20/2022, 5:20 PM

## 2022-12-21 DIAGNOSIS — L578 Other skin changes due to chronic exposure to nonionizing radiation: Secondary | ICD-10-CM | POA: Diagnosis not present

## 2022-12-21 DIAGNOSIS — L821 Other seborrheic keratosis: Secondary | ICD-10-CM | POA: Diagnosis not present

## 2022-12-21 DIAGNOSIS — D1801 Hemangioma of skin and subcutaneous tissue: Secondary | ICD-10-CM | POA: Diagnosis not present

## 2022-12-21 DIAGNOSIS — L814 Other melanin hyperpigmentation: Secondary | ICD-10-CM | POA: Diagnosis not present

## 2022-12-21 DIAGNOSIS — D229 Melanocytic nevi, unspecified: Secondary | ICD-10-CM | POA: Diagnosis not present

## 2022-12-22 NOTE — Progress Notes (Signed)
[name], [name] [id number] Page 1 of 8 Visit Report for 12/20/2022 Arrival Information Details Patient Name: Date of Service: Carmen NDO [place], [name] 12/20/2022 12:30 PM Medical Record Number: [id number] Patient Account Number: [id number] Date of Birth/Sex: Treating RN: Oct 13, 1950 (72 y.o. [name] Primary Care Natassja Ollis: Berniece Andreas Other Clinician: Referring Raad Clayson: Treating Gunnar Hereford/Extender: Skeet Latch in Treatment: 10 Visit Information History Since Last Visit All ordered tests and consults were completed: Yes Patient Arrived: Wheel Chair Added or deleted any medications: No Arrival Time: 12:36 Any new allergies or adverse reactions: No Accompanied By: caregiver Had a fall or experienced change in No Transfer Assistance: Transfer Board activities of daily living that may affect Patient Identification Verified: Yes risk of falls: Secondary Verification Process Completed: Yes Signs or symptoms of abuse/neglect since last visito No Patient Requires Transmission-Based Precautions: No Hospitalized since last visit: No Patient Has Alerts: No Implantable device outside of the clinic excluding No cellular tissue based products placed in the center since last visit: Has Dressing in Place as Prescribed: Yes Pain Present Now: No Electronic Signature(s) Signed: 12/22/2022 4:28:57 PM By: Brenton Grills Entered By: Brenton Grills on 12/20/2022 12:37:05 -------------------------------------------------------------------------------- Clinic Level of Care Assessment Details Patient Name: Date of Service: Carmen NDO [name], Carmen [name] 12/20/2022 12:30 PM Medical Record Number: [id number] Patient Account Number: [id number] Date of Birth/Sex: Treating RN: Sep 02, 1950 (72 y.o. [name] Primary Care Raysean Graumann: Berniece Andreas Other Clinician: Referring Demon Volante: Treating Gillian Meeuwsen/Extender: Skeet Latch in Treatment: 10 Clinic Level of Care Assessment Items TOOL 1 Quantity Score X- 1 0 Use when EandM and Procedure is performed on INITIAL visit ASSESSMENTS - Nursing Assessment / Reassessment X- 1 20 General Physical Exam (combine w/ comprehensive assessment (listed just below) when performed on new pt. evals) X- 1 25 Comprehensive Assessment (HX, ROS, Risk Assessments, Wounds Hx, etc.) ASSESSMENTS - Wound and Skin Assessment / Reassessment X- 1 10 Dermatologic / Skin Assessment (not related to wound area) ASSESSMENTS - Ostomy and/or Continence Assessment and Care []  - 0 Incontinence Assessment and Management []  - 0 Ostomy Care Assessment and Management (repouching, etc.) PROCESS - Coordination of Care X - Simple Patient / Family Education for ongoing care 1 15 [place], [place] F ([id number]) [id number] Page 2 of 8 []  - 0 Complex (extensive) Patient / Family Education for ongoing care X- 1 10 Staff obtains Consents, Records, T Results / Process Orders est X- 1 10 Staff telephones HHA, Nursing Homes / Clarify orders / etc []  - 0 Routine Transfer to another Facility (non-emergent condition) []  - 0 Routine Hospital Admission (non-emergent condition) []  - 0 New Admissions / Manufacturing engineer / Ordering NPWT Apligraf, etc. , []  - 0 Emergency Hospital Admission (emergent condition) PROCESS - Special Needs []  - 0 Pediatric / Minor Patient Management []  - 0 Isolation Patient Management []  - 0 Hearing / Language / Visual special needs []  - 0 Assessment of Community assistance (transportation, D/C planning, etc.) []  - 0 Additional assistance / Altered mentation []  - 0 Support Surface(s) Assessment (bed, cushion, seat, etc.) INTERVENTIONS - Miscellaneous []  - 0 External ear exam []  - 0 Patient Transfer (multiple staff / Nurse, adult / Similar devices) []  - 0 Simple Staple / Suture removal (25 or less) []  - 0 Complex Staple /  Suture removal (26 or more) []  - 0 Hypo/Hyperglycemic Management (do not check if billed separately) []  - 0 Ankle / Brachial Index (ABI) - do not check if  billed separately Has the patient been seen at the hospital within the last three years: Yes Total Score: 90 Level Of Care: New/Established - Level 3 Electronic Signature(s) Signed: 12/22/2022 4:28:57 PM By: Brenton Grills Entered By: Brenton Grills on 12/20/2022 13:12:01 -------------------------------------------------------------------------------- Encounter Discharge Information Details Patient Name: Date of Service: Carmen NDO [name], Carmen [name] 12/20/2022 12:30 PM Medical Record Number: [id number] Patient Account Number: [id number] Date of Birth/Sex: Treating RN: 05-31-1951 (72 y.o. [name] Primary Care Pihu Basil: Berniece Andreas Other Clinician: Referring Markeesha Char: Treating Rodnesha Elie/Extender: Skeet Latch in Treatment: 10 Encounter Discharge Information Items Discharge Condition: Stable Ambulatory Status: Wheelchair Discharge Destination: Home Transportation: Private Auto Accompanied By: caregiver Schedule Follow-up Appointment: Yes Clinical Summary of Care: Patient Declined Electronic Signature(s) Signed: 12/22/2022 4:28:57 PM By: Brenton Grills Entered By: Brenton Grills on 12/20/2022 13:14:30 [name] ([id number]) [id number] Page 3 of 8 -------------------------------------------------------------------------------- Lower Extremity Assessment Details Patient Name: Date of Service: [name], [place] [name] 12/20/2022 12:30 PM Medical Record Number: [id number] Patient Account Number: [id number] Date of Birth/Sex: Treating RN: 12-07-1950 (72 y.o. [name] Primary Care Carl Butner: Berniece Andreas Other Clinician: Referring Bard Haupert: Treating Lessie Manigo/Extender: Skeet Latch in Treatment: 10 Electronic Signature(s) Signed: 12/22/2022  4:28:57 PM By: Brenton Grills Entered By: Brenton Grills on 12/20/2022 12:38:21 -------------------------------------------------------------------------------- Multi Wound Chart Details Patient Name: Date of Service: Carmen NDO [name], Carmen [name] 12/20/2022 12:30 PM Medical Record Number: [id number] Patient Account Number: [id number] Date of Birth/Sex: Treating RN: January 23, 1951 (72 y.o. F) Primary Care Mandrell Vangilder: Berniece Andreas Other Clinician: Referring Mikaele Stecher: Treating Brityn Mastrogiovanni/Extender: Skeet Latch in Treatment: 10 Vital Signs Height(in): 64 Pulse(bpm): 79 Weight(lbs): 115 Blood Pressure(mmHg): 101/63 Body Mass Index(BMI): 19.7 Temperature(F): 98.6 Respiratory Rate(breaths/min): 18 [1:Photos:] [[name]/A:[name]/A] Left Gluteus [name]/A [name]/A Wound Location: Pressure Injury [name]/A [name]/A Wounding Event: Pressure Ulcer [name]/A [name]/A Primary Etiology: Cataracts, Lymphedema, [name]/A [name]/A Comorbid History: Hypotension, Quadriplegia 07/15/2022 [name]/A [name]/A Date Acquired: 10 [name]/A [name]/A Weeks of Treatment: Open [name]/A [name]/A Wound Status: No [name]/A [name]/A Wound Recurrence: 0.6x0.2x0.3 [name]/A [name]/A Measurements L x W x D (cm) 0.094 [name]/A [name]/A A (cm) : rea 0.028 [name]/A [name]/A Volume (cm) : 75.10% [name]/A [name]/A % Reduction in A rea: 94.30% [name]/A [name]/A % Reduction in Volume: Category/Stage III [name]/A [name]/A Classification: Medium [name]/A [name]/A Exudate A mount: Serosanguineous [name]/A [name]/A Exudate Type: red, brown [name]/A [name]/A Exudate Color: Distinct, outline attached [name]/A [name]/A Wound Margin: Medium (34-66%) [name]/A [name]/A Granulation A mount: Red, Pink [name]/A [name]/A Granulation QualityKAILEA, [name] ([id number]) [id number] Page 4 of 8 Medium (34-66%) [name]/A [name]/A Necrotic Amount: Fat Layer (Subcutaneous Tissue): Yes [name]/A [name]/A Exposed Structures: Fascia: No Tendon: No Muscle: No Joint: No Bone: No Small (1-33%) [name]/A [name]/A Epithelialization: Excoriation: No [name]/A [name]/A Periwound Skin Texture: Induration: No Callus: No Crepitus:  No Rash: No Scarring: No Maceration: No [name]/A [name]/A Periwound Skin Moisture: Dry/Scaly: No Atrophie Blanche: No [name]/A [name]/A Periwound Skin Color: Cyanosis: No Ecchymosis: No Erythema: No Hemosiderin Staining: No Mottled: No Pallor: No Rubor: No Treatment Notes Wound #1 (Gluteus) Wound Laterality: Left Cleanser Vashe 5.8 (oz) Discharge Instruction: Cleanse the wound with Vashe prior to applying a clean dressing using gauze sponges, not tissue or cotton balls. Peri-Wound Care Topical Primary Dressing Hydrofera Blue Classic Foam Rope Dressing, 9x6 (mm/in) Discharge Instruction: Moisten with saline prior to packing Secondary Dressing Bordered Gauze, 4x4 in Discharge Instruction: Apply over primary dressing as directed. Secured With 81M Medipore H Soft Cloth Surgical T ape, 4 x  10 (in/yd) Discharge Instruction: Secure with tape as directed. Compression Wrap Compression Stockings Add-Ons Electronic Signature(s) Signed: 12/20/2022 4:36:37 PM By: Geralyn Corwin DO Entered By: Geralyn Corwin on 12/20/2022 13:16:03 -------------------------------------------------------------------------------- Multi-Disciplinary Care Plan Details Patient Name: Date of Service: Carmen NDO [name], Carmen [name] 12/20/2022 12:30 PM Medical Record Number: [id number] Patient Account Number: [id number] Date of Birth/Sex: Treating RN: 14-Nov-1950 (72 y.o. [name] Primary Care Hargun Spurling: Berniece Andreas Other Clinician: Referring Taksh Hjort: Treating Shabria Egley/Extender: Skeet Latch in Treatment: [address] [name], Carmen ([id number]) [id number] Page 5 of 8 Nutrition Nursing Diagnoses: Potential for alteratiion in Nutrition/Potential for imbalanced nutrition Goals: Patient/caregiver agrees to and verbalizes understanding of need to obtain nutritional consultation Date Initiated: 10/11/2022 Target Resolution Date: 10/21/2022 Goal Status:  Active Interventions: Provide education on elevated blood sugars and impact on wound healing Provide education on nutrition Treatment Activities: Patient referred to Primary Care Physician for further nutritional evaluation : 10/11/2022 Notes: Orientation to the Wound Care Program Nursing Diagnoses: Knowledge deficit related to the wound healing center program Goals: Patient/caregiver will verbalize understanding of the Wound Healing Center Program Date Initiated: 10/11/2022 Target Resolution Date: 10/22/2022 Goal Status: Active Interventions: Provide education on orientation to the wound center Notes: Pressure Nursing Diagnoses: Potential for impaired tissue integrity related to pressure, friction, moisture, and shear Goals: Patient will remain free from development of additional pressure ulcers Date Initiated: 10/11/2022 Target Resolution Date: 10/22/2022 Goal Status: Active Patient/caregiver will verbalize understanding of pressure ulcer management Date Initiated: 10/11/2022 Target Resolution Date: 10/22/2022 Goal Status: Active Interventions: Assess: immobility, friction, shearing, incontinence upon admission and as needed Assess offloading mechanisms upon admission and as needed Provide education on pressure ulcers Notes: Wound/Skin Impairment Nursing Diagnoses: Knowledge deficit related to ulceration/compromised skin integrity Goals: Patient/caregiver will verbalize understanding of skin care regimen Date Initiated: 10/11/2022 Target Resolution Date: 10/22/2022 Goal Status: Active Interventions: Assess patient/caregiver ability to perform ulcer/skin care regimen upon admission and as needed Assess ulceration(s) every visit Provide education on ulcer and skin care Treatment Activities: Skin care regimen initiated : 10/11/2022 Topical wound management initiated : 10/11/2022 Notes: Electronic Signature(s) Signed: 12/22/2022 4:28:57 PM By: Steffanie Rainwater, [name]  ([id number]) By: Brenton Grills [phone].pdf Page 6 of 8 Signed: 12/22/2022 4:28:57 PM Entered By: Brenton Grills on 12/20/2022 12:54:01 -------------------------------------------------------------------------------- Pain Assessment Details Patient Name: Date of Service: [name], [place] [name] 12/20/2022 12:30 PM Medical Record Number: [id number] Patient Account Number: [id number] Date of Birth/Sex: Treating RN: 22-Sep-1950 (72 y.o. [name] Primary Care Macey Wurtz: Berniece Andreas Other Clinician: Referring Marvon Shillingburg: Treating Nery Kalisz/Extender: Skeet Latch in Treatment: 10 Active Problems Location of Pain Severity and Description of Pain Patient Has Paino No Site Locations Pain Management and Medication Current Pain Management: Electronic Signature(s) Signed: 12/22/2022 4:28:57 PM By: Brenton Grills Entered By: Brenton Grills on 12/20/2022 12:38:02 -------------------------------------------------------------------------------- Patient/Caregiver Education Details Patient Name: Date of Service: Carmen NDO [name], Carmen [name] 7/8/2024andnbsp12:30 PM Medical Record Number: [id number] Patient Account Number: [id number] Date of Birth/Gender: Treating RN: 02/01/1951 (72 y.o. [name] Primary Care Physician: Berniece Andreas Other Clinician: Referring Physician: Treating Physician/Extender: Skeet Latch in Treatment: 10 Education Assessment Education Provided To: Patient and Caregiver Education Topics Provided [name], [name] ([id number]) [id number] Page 7 of 8 Wound/Skin Impairment: Methods: Explain/Verbal Responses: State content correctly Electronic Signature(s) Signed: 12/22/2022 4:28:57 PM By: Brenton Grills Entered By: Brenton Grills on 12/20/2022 12:54:23 -------------------------------------------------------------------------------- Wound Assessment Details Patient Name:  Date of Service: [name]  NDO [name], Carmen [name] 12/20/2022 12:30 PM Medical Record Number: [id number] Patient Account Number: [id number] Date of Birth/Sex: Treating RN: 25-Feb-1951 (72 y.o. [name] Primary Care Persis Graffius: Berniece Andreas Other Clinician: Referring Bronislaus Verdell: Treating Jaelon Gatley/Extender: Skeet Latch in Treatment: 10 Wound Status Wound Number: 1 Primary Etiology: Pressure Ulcer Wound Location: Left Gluteus Wound Status: Open Wounding Event: Pressure Injury Comorbid History: Cataracts, Lymphedema, Hypotension, Quadriplegia Date Acquired: 07/15/2022 Weeks Of Treatment: 10 Clustered Wound: No Photos Wound Measurements Length: (cm) 0.6 Width: (cm) 0.2 Depth: (cm) 0.3 Area: (cm) 0.094 Volume: (cm) 0.028 % Reduction in Area: 75.1% % Reduction in Volume: 94.3% Epithelialization: Small (1-33%) Tunneling: No Undermining: No Wound Description Classification: Category/Stage III Wound Margin: Distinct, outline attached Exudate Amount: Medium Exudate Type: Serosanguineous Exudate Color: red, brown Foul Odor After Cleansing: No Slough/Fibrino Yes Wound Bed Granulation Amount: Medium (34-66%) Exposed Structure Granulation Quality: Red, Pink Fascia Exposed: No Necrotic Amount: Medium (34-66%) Fat Layer (Subcutaneous Tissue) Exposed: Yes Necrotic Quality: Adherent Slough Tendon Exposed: No Muscle Exposed: No Joint Exposed: No Bone Exposed: No [address] [name], [name] ([id number]) [id number] Page 8 of 8 No Abnormalities Noted: No No Abnormalities Noted: No Callus: No Atrophie Blanche: No Crepitus: No Cyanosis: No Excoriation: No Ecchymosis: No Induration: No Erythema: No Rash: No Hemosiderin Staining: No Scarring: No Mottled: No Pallor: No Moisture Rubor: No No Abnormalities Noted: No Dry / Scaly: No Maceration: No Treatment Notes Wound #1 (Gluteus) Wound Laterality:  Left Cleanser Vashe 5.8 (oz) Discharge Instruction: Cleanse the wound with Vashe prior to applying a clean dressing using gauze sponges, not tissue or cotton balls. Peri-Wound Care Topical Primary Dressing Hydrofera Blue Classic Foam Rope Dressing, 9x6 (mm/in) Discharge Instruction: Moisten with saline prior to packing Secondary Dressing Bordered Gauze, 4x4 in Discharge Instruction: Apply over primary dressing as directed. Secured With 37M Medipore H Soft Cloth Surgical T ape, 4 x 10 (in/yd) Discharge Instruction: Secure with tape as directed. Compression Wrap Compression Stockings Add-Ons Electronic Signature(s) Signed: 12/22/2022 4:28:57 PM By: Brenton Grills Entered By: Brenton Grills on 12/20/2022 12:51:03 -------------------------------------------------------------------------------- Vitals Details Patient Name: Date of Service: Carmen NDO [name], Carmen [name] 12/20/2022 12:30 PM Medical Record Number: [id number] Patient Account Number: [id number] Date of Birth/Sex: Treating RN: 1950/09/07 (72 y.o. [name] Primary Care Rayneisha Bouza: Berniece Andreas Other Clinician: Referring Zuma Hust: Treating Kaiana Marion/Extender: Skeet Latch in Treatment: 10 Vital Signs Time Taken: 12:37 Temperature (F): 98.6 Height (in): 64 Pulse (bpm): 79 Weight (lbs): 115 Respiratory Rate (breaths/min): 18 Body Mass Index (BMI): 19.7 Blood Pressure (mmHg): 101/63 Reference Range: 80 - 120 mg / dl Electronic Signature(s) Signed: 12/22/2022 4:28:57 PM By: Brenton Grills Entered By: Brenton Grills on 12/20/2022 12:37:54

## 2022-12-24 ENCOUNTER — Other Ambulatory Visit: Payer: Self-pay | Admitting: Internal Medicine

## 2022-12-24 ENCOUNTER — Ambulatory Visit: Payer: Medicare PPO | Admitting: Occupational Therapy

## 2022-12-24 ENCOUNTER — Encounter: Payer: Self-pay | Admitting: Physical Therapy

## 2022-12-24 ENCOUNTER — Ambulatory Visit: Payer: Medicare PPO | Admitting: Physical Therapy

## 2022-12-24 DIAGNOSIS — R2689 Other abnormalities of gait and mobility: Secondary | ICD-10-CM

## 2022-12-24 DIAGNOSIS — R293 Abnormal posture: Secondary | ICD-10-CM

## 2022-12-24 DIAGNOSIS — M6281 Muscle weakness (generalized): Secondary | ICD-10-CM | POA: Diagnosis not present

## 2022-12-24 DIAGNOSIS — G8254 Quadriplegia, C5-C7 incomplete: Secondary | ICD-10-CM

## 2022-12-24 DIAGNOSIS — R278 Other lack of coordination: Secondary | ICD-10-CM

## 2022-12-24 DIAGNOSIS — M24541 Contracture, right hand: Secondary | ICD-10-CM

## 2022-12-24 DIAGNOSIS — M24542 Contracture, left hand: Secondary | ICD-10-CM

## 2022-12-24 DIAGNOSIS — R208 Other disturbances of skin sensation: Secondary | ICD-10-CM

## 2022-12-24 DIAGNOSIS — R29898 Other symptoms and signs involving the musculoskeletal system: Secondary | ICD-10-CM | POA: Diagnosis not present

## 2022-12-24 DIAGNOSIS — R29818 Other symptoms and signs involving the nervous system: Secondary | ICD-10-CM | POA: Diagnosis not present

## 2022-12-24 DIAGNOSIS — G8253 Quadriplegia, C5-C7 complete: Secondary | ICD-10-CM

## 2022-12-24 NOTE — Therapy (Signed)
OUTPATIENT PHYSICAL THERAPY NEURO TREATMENT-10th VISIT PROGRESS NOTE   Patient Name: [name] MRN: [id number] DOB:10/12/1950, 72 y.o., female Today's Date: 12/24/2022   PCP: [name], MD REFERRING PROVIDER: [name], MD  PT progress note for [name].  Reporting period 11/18/2022 to 12/24/2022  See Note below for Objective Data and Assessment of Progress/Goals  Thank you for the referral of this patient. [name], PT, DPT  END OF SESSION:  PT End of Session - 12/24/22 1104     Visit Number 20    Number of Visits 25   recert   Date for PT Re-Evaluation 01/26/23   to allow for scheduling delays   Authorization Type [employer]IC[employer]    Progress Note Due on Visit 30    PT Start Time 1104   PT waiting for working Hi-Lo mat table to be free.   PT Stop Time 1150    PT Time Calculation (min) 46 min    Equipment Utilized During Treatment Gait belt    Activity Tolerance Patient tolerated treatment well    Behavior During Therapy WFL for tasks assessed/performed                 Past Medical History:  Diagnosis Date   CERVICAL POLYP 03/11/2008   Qualifier: Diagnosis of  By: [name] MD, [name]    Colon polyps 2005   on colonscopy [name]   Fibroid 2004   Per Dr. [name]   History of shingles    face and mouth   Hx of skin cancer, basal cell    Rosacea    Sciatica of left side 09/28/2013   Scoliosis    noted on mri done for back pain   Past Surgical History:  Procedure Laterality Date   BUNIONECTOMY     Patient Active Problem List   Diagnosis Date Noted   Buttock wound, left, initial encounter 11/02/2022   Orthostatic hypotension 08/13/2022   Neurogenic bowel 05/03/2022   Spasticity 05/03/2022   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/03/2022   Nerve pain 05/03/2022   Medication monitoring encounter 01/08/2022   Neurogenic bladder 10/11/2021   Urinary incontinence 10/11/2021   ESBL (extended spectrum beta-lactamase) producing bacteria  infection 10/09/2021   Recurrent UTI 10/09/2021   Quadriplegia, C5-C7 incomplete (HCC) 01/16/2021   History of spinal fracture 01/16/2021   Suprapubic catheter (HCC) 01/16/2021   Encounter for routine gynecological examination 09/28/2013   Onychomycosis 09/28/2013   Foot deformity, acquired 03/26/2012   Encounter for preventive health examination 12/25/2010   ROSACEA 08/25/2009   Disturbance in sleep behavior 03/11/2008   SKIN CANCER, HX OF 03/11/2008   DYSURIA, HX OF 03/11/2008   Hyperlipidemia 02/10/2007   CERVICALGIA 02/10/2007    ONSET DATE: 09/28/2022 (most recent referral)  REFERRING DIAG: G82.54 (ICD-10-CM) - Quadriplegia, C5-C7, incomplete (HCC)  THERAPY DIAG:  Other lack of coordination  Muscle weakness (generalized)  Abnormal posture  Other abnormalities of gait and mobility  Rationale for Evaluation and Treatment: Rehabilitation  SUBJECTIVE:  SUBJECTIVE STATEMENT: Pt reports no acute changes since last visit.  She continues to not have wound vac as tunneling of wound has reportedly closed.  She is going to communicate with [facility] regarding wound likely next week if all continues to go well.    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 Yes: NPRS scale: 3/10 Pain location: forearms to fingertips Pain description: constant, pinprick/tingling Aggravating factors: nighttime Relieving factors: nothing, sometimes medicines  PRECAUTIONS: Fall; suprapubic catheter (she wants to get this removed meaning she needs to get to and from the toilet); she has had minor heat sensation when needing to complete her bowel program-possible AD?  WEIGHT  BEARING RESTRICTIONS: No-pt was in stander at most recent therapy in [place] last week.  She will have a bone density done soon w/ Dr. [name].  FALLS: Has patient fallen in last 6 months? No  LIVING ENVIRONMENT: Lives with: lives with their spouse and live-in nurse [name] Lives in: House/apartment-townhouse Stairs: No-level entry, but multi-level home 4 stories w/ elevator Has following equipment at home: Wheelchair (power), Wheelchair (manual), Grab bars, and standing frame, sliding board, transport shower chair, handheld shower head, leg strap-pt reports she no longer finds this helpful  PLOF: Requires assistive device for independence, Needs assistance with ADLs, Needs assistance with homemaking, and Needs assistance with transfers  OCCUPATION:  Writer-uses Dragon to dictate  PATIENT GOALS: "Make my right leg work."  Stand and pivot so she can more easily access a toilet.  OBJECTIVE:   DIAGNOSTIC FINDINGS: No recent relevant imaging.  Bone density scheduled 10/15/2022.  COGNITION: Overall cognitive status: Within functional limits for tasks assessed   SENSATION: Light touch: Diminished ability to distinguish sharp and dull, but able to distinguish light touch from injury level down accurately  COORDINATION: Not formally assessed.  EDEMA:  Well managed w/ lymphatic massage and compression stockings.  MUSCLE TONE: Pt has intermittent clonus during transfers.  POSTURE: rounded shoulders, posterior pelvic tilt, and right thoracic scoliosis   LOWER EXTREMITY ROM:     Passive  Right 10/20/22 Left 10/20/22  Hip flexion [facility]  Hip extension    Hip abduction    Hip adduction    Hip internal rotation [facility] WFL  Hip external rotation [facility]  Knee flexion [facility] WFL  Knee extension [facility] WFL  Ankle dorsiflexion Slight PF contracture Slight PF contracture  Ankle plantarflexion    Ankle inversion    Ankle eversion     (Blank rows = not tested)    LOWER EXTREMITY MMT:    MMT  Right 10/20/22 Left 10/20/22  Hip flexion 1 2+  Hip extension    Hip abduction    Hip adduction    Hip internal rotation    Hip external rotation    Knee flexion 2- 3  Knee extension 2- 3  Ankle dorsiflexion 0 3  Ankle plantarflexion    Ankle inversion    Ankle eversion     (Blank rows = not tested)   BED MOBILITY:  Sit to supine Mod A Supine to sit Mod A Rolling to Right Mod A Rolling to Left Mod A Undulating mattress for wound management on standard bed (elevated-so often doing uphill sliding board transfers); she would like to continue working on sitting up independently, she has been working on rolling, needs less assistance w/ this when someone props  her leg into hooklying; would like something to help her pull her left leg to her butt for stretching as well as bed mobility.  FUNCTIONAL TESTS:  None relevant to pt's current functional level and abilities.  PATIENT SURVEYS:  None completed due to time.  TODAY'S TREATMENT:        BP not assessed today as patient feeling good at onset of session and reporting low fatigue.  Using Stedy stander progressing from 6.4" to ~24 (x4 pull-to-stands completed) and dropped by 1 inch each stand.  She is very fatigued following last stand with longest stand today being 20-25 seconds long w/ great upper body and trunk control noted.    In sitting patient first performs balloon taps (3 rounds) then semi-circle gumdrop reaching task starting with feet on floor progressing to feet on airex and height of mat elevated to place more weight into BLE and challenge core stability.  Added heavier flexbar for increased core engagement and perturbation with targets pushed farther away due to long-lever arm.  Had patient perform right rotation to right anterolateral oriented dot due to most challenge using CGA due to repeated forward LOB.  Repeated to find limits of stability and challenge at end range.    PATIENT EDUCATION: Education details: Research scientist (life sciences) at home.   Person educated: Patient and Arts administrator Education method: Explanation Education comprehension: verbalized understanding  HOME EXERCISE PROGRAM: Will be established as needed as pt has done continuous therapy and is working towards functional tasks.  GOALS: Goals reviewed with patient? Yes   NEW SHORT TERM GOALS=LONG TERM GOALS due to length of POC   NEW LONG TERM GOALS:  Target date: 01/12/2023  Pt will perform squat pivot transfer w/ no more than modA in order to improve access to home environment for toilet transfers. Baseline: minA for lateral scoot transfer, pt able to clear bottom intermittently; min A for "bump" transfer with assist just needed for LE management (7/2) Goal status: IN PROGRESS  2.  Pt will report wound healing in order to return to day program at [facility] [place]. Baseline:  Ischial wound, pt reports wound is healing but does have tunneling so she continues to utilize a wound vac and wound care services (7/2) Goal status: IN PROGRESS  3.  Pt will perform sit to stand transfer with no more than mod A with LRAD to demonstrate improved capacity for higher level transfers and access to uneven surfaces Baseline: max A to stand to stedy as of 7/2 Goal status: IN PROGRESS  4.  Pt will demonstrate ability to place slide board independently in preparation for slide board transfers Baseline: dependent for placement (7/2) Goal status: INITIAL  5.  Pt and caregiver will demonstrate independence with use of patient's personal NMES device for safe muscle activation and strengthening in her home environment Baseline: min cueing for safe setup (7/2) Goal status: INITIAL    ASSESSMENT:  CLINICAL IMPRESSION: Patient doing well with stander task today able to progress to standing from a height of roughly 24 inches.  Returned to sitting upon fatigue for core work and seated dynamic stability using varied sitting height and foot surface placement to  vary challenge level.  She continues to benefit from skilled PT to further progress towards LTGs as safely able.  OBJECTIVE IMPAIRMENTS: decreased balance, decreased coordination, decreased mobility, difficulty walking, decreased ROM, decreased strength, hypomobility, increased edema, impaired flexibility, impaired sensation, impaired tone, impaired UE functional use, improper body mechanics, postural dysfunction, and pain.  ACTIVITY LIMITATIONS: carrying, lifting, bending, sitting, standing, squatting, stairs, transfers, bed mobility, continence, bathing, toileting, dressing, reach over head, hygiene/grooming, locomotion level, and caring for others  PARTICIPATION LIMITATIONS: meal prep, cleaning, laundry, driving, and community activity  PERSONAL FACTORS: Age, Fitness, Past/current experiences, Time since onset of injury/illness/exacerbation, and 1-2 comorbidities: intermittent AD, neurogenic bowel/bladder on active bladder training  are also affecting patient's functional outcome.   REHAB POTENTIAL: Good  CLINICAL DECISION MAKING: Evolving/moderate complexity  EVALUATION COMPLEXITY: Moderate  PLAN:  PT FREQUENCY: 2x/week  PT DURATION: 8 weeks + 4 weeks (recert)  PLANNED INTERVENTIONS: Therapeutic exercises, Therapeutic activity, Neuromuscular re-education, Balance training, Patient/Family education, Self Care, DME instructions, Electrical stimulation, Wheelchair mobility training, Manual therapy, and Re-evaluation  PLAN FOR NEXT SESSION:  NMES to LE (making sure pt and [name] know how to set it up, where to place electrodes, etc), Core strength, static sitting balance-perturbations.  Least restrictive standing--can progress to stedy vs in // bars if skilled +2 available, use of powderboard on mat table for RLE strengthening, wants to work on increasing independence with lateral scoot transfers without SB and also wants to be able to place SB independently and move her LE more  independently; use of leg loops/lifters to increase independence with LE management with transfers and bed mobility if pt is willing to use AE, standing in // bars with knees blocked or knee immobilizers to assess standing without stedy, did she purchase Vive patient turner?, work towards discussion of PT POC; placement strategies/problem solving independence with slideboard placement-enough grip and core stability for decreased assistance?  Sadie Haber, PT, DPT  12/24/2022, 12:25 PM

## 2022-12-24 NOTE — Therapy (Signed)
OUTPATIENT OCCUPATIONAL THERAPY NEURO TREATMENT NOTE Patient Name: [name] MRN: [id number] DOB:1951-06-06, 72 y.o., female Today's Date: 12/24/2022  PCP: [name], MD  REFERRING PROVIDER: [name], MD  END OF SESSION:  OT End of Session - 12/24/22 1151     Visit Number 20    Number of Visits 25    Date for OT Re-Evaluation 01/07/23    Authorization Type [employer] - requires auth, [place] (additional auth requested)    Authorization Time Period 10/12/22 - 12/10/22    Authorization - Number of Visits 17    Progress Note Due on Visit 26    OT Start Time 1151    OT Stop Time 1230    OT Time Calculation (min) 39 min    Activity Tolerance Patient tolerated treatment well    Behavior During Therapy [facility] for tasks assessed/performed              Past Medical History:  Diagnosis Date   CERVICAL POLYP 03/11/2008   Qualifier: Diagnosis of  By: [name] MD, [name]    Colon polyps 2005   on colonscopy [name]   Fibroid 2004   Per Dr. [name]   History of shingles    face and mouth   Hx of skin cancer, basal cell    Rosacea    Sciatica of left side 09/28/2013   Scoliosis    noted on mri done for back pain   Past Surgical History:  Procedure Laterality Date   BUNIONECTOMY     Patient Active Problem List   Diagnosis Date Noted   Buttock wound, left, initial encounter 11/02/2022   Orthostatic hypotension 08/13/2022   Neurogenic bowel 05/03/2022   Spasticity 05/03/2022   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/03/2022   Nerve pain 05/03/2022   Medication monitoring encounter 01/08/2022   Neurogenic bladder 10/11/2021   Urinary incontinence 10/11/2021   ESBL (extended spectrum beta-lactamase) producing bacteria infection 10/09/2021   Recurrent UTI 10/09/2021   Quadriplegia, C5-C7 incomplete (HCC) 01/16/2021   History of spinal fracture 01/16/2021   Suprapubic catheter (HCC) 01/16/2021   Encounter for routine gynecological examination 09/28/2013   Onychomycosis  09/28/2013   Foot deformity, acquired 03/26/2012   Encounter for preventive health examination 12/25/2010   ROSACEA 08/25/2009   Disturbance in sleep behavior 03/11/2008   SKIN CANCER, HX OF 03/11/2008   DYSURIA, HX OF 03/11/2008   Hyperlipidemia 02/10/2007   CERVICALGIA 02/10/2007    ONSET DATE: 07/28/2020  Date of Referral 09/28/22  REFERRING DIAG: G82.54 (ICD-10-CM) - Quadriplegia, C5-C7, incomplete (HCC)  THERAPY DIAG:  Other lack of coordination  Muscle weakness (generalized)  Quadriplegia, C5-C7 incomplete (HCC)  Other abnormalities of gait and mobility  Contracture of hand joint, left  Contracture of hand joint, right  Other disturbances of skin sensation  Quadriplegia, C5-C7 complete (HCC)  Other symptoms and signs involving the musculoskeletal system  Rationale for Evaluation and Treatment: Rehabilitation  SUBJECTIVE:   SUBJECTIVE STATEMENT:   She was unable to get the ring to work correctly for knitting.   Pt accompanied by:  Live in Caregiver - [name]   PERTINENT HISTORY: "[name],  [name]; neurogenic bowel and bladder and spasticity; [name], [name] Here for f/u on Incomplete quadriplegia"  Referral from MD 09/28/22 states, "Please eval and treat for ADLs and higher level  mobility."  PRECAUTIONS: Fall; suprapubic catheter (she wants to get this removed meaning she needs to get to and from the toilet); she has had minor heat sensation when needing to complete her bowel program-possible AD?   WEIGHT BEARING RESTRICTIONS: No-pt was in stander at most recent therapy in [place] last week.  She will have a bone density done soon w/ Dr. [name].   PAIN:  Patient reports chronic pain 3/10 fingers to elbow bilaterally managed by medication and will inform therapy staff of any changes.  FALLS: Has  patient fallen in last 6 months? No  LIVING ENVIRONMENT: Lives with: lives with their family - husband [name] and with an adult companion s/p moving back up from [place] x10 months Lives in: House/apartment Stairs:  4 story town house with an Engineer, structural with threshold adjustments, roll in shower with transport chair Has following equipment at home: Wheelchair (power) - with seat height adjustments to access counters and reclining option, Wheelchair (manual), transport WC, shower chair, and Ramped entry, handheld showerhead with rails around toilet, had Michiel Sites but is no longer in need of it, has slide boards x3  PLOF: Requires assistive device for independence, Needs assistance with ADLs, Needs assistance with homemaking, Needs assistance with gait, and Needs assistance with transfers; 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  Used to like to knit, sew and bake.  PATIENT GOALS: Wants to be able to type - currently using advanced Dragon dictation at times but prefers to type at times.  She would like to be able to write better and is interested in resuming some leisure activities such as Archivist and baking.  She also wants to keep working on being able to cut her own food and on her UE strength.   OBJECTIVE:   HAND DOMINANCE: Left  ADLs: Overall ADLs: Patient has a live in caregiver  Transfers/ambulation related to ADLs: Mod assist with sliding board transfers (previously [employer] lift).  Eating: Has a rocker knife that she can use. Used to use adapted utensils but now uses regular utensils but still will get assistance to cut food ie) when eating out.  Grooming: can brush her own hair but unable to manage jewelry ie) earrings  UB Dressing: can zip/unzip after it has been started, unable to manage buttons herself, Caregiver assists but if she has extra time, she can put on her bra, and a loose fitting pullover shirt/t-shirt  LB Dressing: dependent for LB dressing in bed and with  special sock donner for LE compression garments   Toileting: bladder trained with suprapubic catheter which she clamps off.  Dependent for bowel incontinence care.  Bathing: Sponge bath with adult washclothes.  Can bath UB with back scrubber for most of her back.  Needs help with feet (mentioned she might need a separate brush for feet)   Tub Shower transfers: Min-mod assist with slide board  Equipment: Shower seat with back, Walk in shower, bed side commode, Reacher, Sock aid, Long handled sponge, and Feeding equipment  IADLs: --  Shopping: Assisted by caregiver  Light housekeeping: Has housekeeper that comes monthly  Meal Prep: previously enjoyed baking. Assisted by caregiver but described recent success at reheating a meal for herself after getting food out of the fridge/freezer from her WC.  Community mobility: Dependent  Medication management: Caregiver sorts them into pillbox but she is very aware of her medications   Financial management: Patient manages her own finances  Handwriting: Increased time and has a pen with a  little grip  MOBILITY STATUS: Independent with power mobiity  POSTURE COMMENTS:  No Significant postural limitations and forward head Sitting balance: Supports self independently with both Ues  ACTIVITY TOLERANCE: Activity tolerance: Fair - MMT WFL but has limited sustained tolerance for ongoing use of UEs  FUNCTIONAL OUTCOME MEASURES: 10/20/22 QuickDash 31.8 points   UPPER EXTREMITY ROM:   AROM - WFL without obvious contractures, some digital flexion noted but PROM WNL   AROM Right (eval) Left (eval)  Shoulder flexion [facility]  Shoulder abduction [facility]  Elbow flexion [facility] WFL  Elbow extension [facility]  Wrist flexion [facility] WFL  Wrist extension WFL WFL  Ulnar deviation WFL Decreased ulnar  deviation past midline  Wrist pronation [facility] WFL  Wrist supination [facility]  Digit Composite Flexion Lacks full AROM:   1st digit - 5 cm   3rd digit -1 cm    4th digit - 2 cm  Lacks full AROM:   1st digit - 1.5 cm  5th digit - 3 cm   Digit Composite Extension [facility]  Digit Opposition Opposition to index finger only Lacks to pinkie due to limited DIP pinkie flexion  (Blank rows = not tested)  UPPER EXTREMITY MMT:   Grossly WFL - Endurance limited R tricep strength > than L but L UE generally stronger than R UE  MMT Right (eval) Left (eval)  Shoulder flexion 4/5 4/5  Shoulder abduction 4/5 4/5  Elbow flexion 4/5 4/5  Elbow extension 4/5 4-/5  (Blank rows = not tested)  HAND FUNCTION: Grip strength: Right: 4.8 lbs; Left: 20.9 lbs 11/17/22 Right - 7.2 lbs  COORDINATION: Finger Nose Finger test: R generally WFL, L WNL Box and Blocks:  Right 28 blocks, Left 37 blocks R hand finger eventually cramps and dexterity worsens in the cold  SENSATION: Light touch: Impaired  - patient   EDEMA: NA for UEs but LE has poor lymph drainage with custom compression garments   MUSCLE TONE: Generally WFL but will assess further  COGNITION: Overall cognitive status: Within functional limits for tasks assessed  VISION: Subjective report: Patent wears progressive lens/glasses.  Denies diplopia or vision changes but has eye exam in the next couple of months. Baseline vision: Wears glasses all the time  VISION ASSESSMENT: WFL  EVALUATION OBSERVATIONS: Patient independent with power WC navigation within clinic.  Patient is well-kept with foley catheter in place.  She has slight limitations in full extension of digits but PROM is WNL and she has splints at home that she said she will bring for OT staff to assess.  She has functional ROM of B UEs to reach her head, behind her back and to cross midline to assist with ADLs.     TODAY'S TREATMENT:                                                                                                                           Therapeutic Activities:  OT adjusted yarn  ring for knitting and educated pt on how  to position yarn within ring with respect to knitting. Pt encouraged to utilize rolled up towel under L elbow for better positioning.   - Self-care/home management completed for duration as noted below including:  OT educated pt on use of hairdryer holder, use of towel for support of UE, or hairdryer brush for more independence.   PATIENT EDUCATION: Education details: Modifications for knitting person educated: Patient and Caregiver Live in caregiver - [name] Education method: Explanation, Demonstration, and Verbal cues,  Education comprehension: verbalized understanding and needs further education   HOME EXERCISE PROGRAM: 11/25/2022: NMES use  12/02/22: All previous HEPs combined to 1 complete List through MedBridge Access Code: [email or web address] URL: [email or web address] Date: 12/02/2022 Prepared by: Amada Kingfisher   GOALS:   SHORT TERM GOALS: Target date: 11/09/22  Patient will be able to use AE/modified techniques to cut soft foods small enough for oral intake. Baseline: Caregiver/spouse assist Goal status: IN PROGRESS  2.  Patient will be able to use AE/modified positioning to complete word search by drawing lines through words with 0 errors. Baseline: TBD Goal status: NOT MET - Goal discontinued as patient focusing on writing more than word search ie) note taking etc.  3.  Patient will verbalize understanding of good pressure relief schedule (for 15 to 60 seconds every 15 to 60 minutes) to help with wound healing. Baseline: Patient did not change positioning in > 45 minutes of OT eval. Goal status: MET  4.  Patient will be assisted to explore modifications for leisure tasks (knit/sew/bake) with good return demonstration. Baseline: Minimal involvement Goal status: IN PROGRESS  11/17/22 - trialled knitting  12/02/22 & 12/20/22 - continued AE exploration  5.  Patient will demonstrate independence with HEP for UE strengthening, coordination and ROM to prevent contractures and  maintain strength for transfers and ADLs. Baseline: Previous HEPs have been established but need to be reviewed and updated.  Goal status: IN PROGRESS  11/17/22 working on lists and categorizing activities 12/02/22 Comprehensive Med[employer] list compiled  6.  Patient will be assessed for typing speed/dexterity. Baseline: Patient reports difficulty with typing with all her fingers. Goal status: IN PROGRESS  7. Patient will complete Quick Dash UE assessment. Baseline: TBD Goal Status: 10/20/22 Discontinued - see scores above   LONG TERM GOALS: Target date: 01/07/23  Patient will complete 1 small craft/week r/t her leisure interests to work on FMS daily. Baseline: Minimal involvement Goal status: IN PROGRESS  2.  Patient will improve B coordination for increased typing speed/dexterity x 1-2 WPM. Baseline: TBD Goal status: IN PROGRESS  11/17/22 daily engagement with typing at home although modified use of digits for speed  3.  Patient will improve B UE coordination, strength and functional use to bake cookies with setup assistance and AE/modified techniques as appropriate.  Baseline: Not performed. Goal status: IN PROGRESS  4.  Patient will report no more than moderate difficulty using a knife to foods such as chicken, small enough for oral intake using AE and strategies as needed. Baseline: Caregiver/spouse assist Goal status: IN PROGRESS  5.  Patient will be able to use AE/modified positioning to complete 4 sentences with 100% legibility. Baseline: Subjective reports of difficulty with writing Goal status: IN PROGRESS   ASSESSMENT:  CLINICAL IMPRESSION:   Pt demonstrating good response to incorporation of adaptive equipment and strategies as needed to improve independence and safety with ADLs and IADLs.   PERFORMANCE DEFICITS: in functional skills including ADLs, IADLs, coordination, dexterity,  strength, muscle spasms, Fine motor control, Gross motor control, continence, skin  integrity, and UE functional use,   IMPAIRMENTS: are limiting patient from ADLs, IADLs, work, and leisure.   CO-MORBIDITIES: has co-morbidities such as incontinence and wound  that affects occupational performance. Patient will benefit from skilled OT to address above impairments and improve overall function.  REHAB POTENTIAL: Fair due to chronicity of injury   PLAN:  OT FREQUENCY: 2x/week  OT DURATION: additional 4 weeks  PLANNED INTERVENTIONS: self care/ADL training, therapeutic exercise, therapeutic activity, neuromuscular re-education, manual therapy, passive range of motion, balance training, functional mobility training, splinting, patient/family education, energy conservation, coping strategies training, and DME and/or AE instructions  RECOMMENDED OTHER SERVICES: None at this time  CONSULTED AND AGREED WITH PLAN OF CARE: Patient and family member/caregiver  PLAN FOR NEXT SESSION: hair drying and knitting (how are they going)  Review positioning of R hand to help with finger opposition and isolation of finger joints.  Continue FM tasks to improve progression of ADLs/IADLS hobby/craft activities (ie baking, yarn activities & baking).   Delana Meyer, OT 12/24/2022, 2:17 PM

## 2022-12-27 ENCOUNTER — Encounter (HOSPITAL_BASED_OUTPATIENT_CLINIC_OR_DEPARTMENT_OTHER): Payer: Medicare PPO | Admitting: Internal Medicine

## 2022-12-27 ENCOUNTER — Other Ambulatory Visit: Payer: Self-pay | Admitting: Family

## 2022-12-27 ENCOUNTER — Ambulatory Visit: Payer: Medicare PPO | Admitting: Occupational Therapy

## 2022-12-27 ENCOUNTER — Ambulatory Visit: Payer: Medicare PPO | Admitting: Physical Therapy

## 2022-12-27 DIAGNOSIS — L89323 Pressure ulcer of left buttock, stage 3: Secondary | ICD-10-CM

## 2022-12-27 DIAGNOSIS — R278 Other lack of coordination: Secondary | ICD-10-CM

## 2022-12-27 DIAGNOSIS — R293 Abnormal posture: Secondary | ICD-10-CM

## 2022-12-27 DIAGNOSIS — M6281 Muscle weakness (generalized): Secondary | ICD-10-CM

## 2022-12-27 DIAGNOSIS — T798XXA Other early complications of trauma, initial encounter: Secondary | ICD-10-CM

## 2022-12-27 DIAGNOSIS — M24542 Contracture, left hand: Secondary | ICD-10-CM | POA: Diagnosis not present

## 2022-12-27 DIAGNOSIS — G8250 Quadriplegia, unspecified: Secondary | ICD-10-CM

## 2022-12-27 DIAGNOSIS — R29818 Other symptoms and signs involving the nervous system: Secondary | ICD-10-CM | POA: Diagnosis not present

## 2022-12-27 DIAGNOSIS — R29898 Other symptoms and signs involving the musculoskeletal system: Secondary | ICD-10-CM | POA: Diagnosis not present

## 2022-12-27 DIAGNOSIS — G8254 Quadriplegia, C5-C7 incomplete: Secondary | ICD-10-CM

## 2022-12-27 DIAGNOSIS — M24541 Contracture, right hand: Secondary | ICD-10-CM | POA: Diagnosis not present

## 2022-12-27 DIAGNOSIS — R2689 Other abnormalities of gait and mobility: Secondary | ICD-10-CM | POA: Diagnosis not present

## 2022-12-27 NOTE — Therapy (Signed)
OUTPATIENT PHYSICAL THERAPY NEURO TREATMENT   Patient Name: [name] MRN: [id number] DOB:May 29, 1951, 72 y.o., female Today's Date: 12/27/2022   PCP: [name], MD REFERRING PROVIDER: [name], MD   END OF SESSION:  PT End of Session - 12/27/22 1444     Visit Number 21    Number of Visits 25   recert   Date for PT Re-Evaluation 01/26/23   to allow for scheduling delays   Authorization Type [employer] MEDICARE    Progress Note Due on Visit 30    PT Start Time 1442    PT Stop Time 1530    PT Time Calculation (min) 48 min    Equipment Utilized During Treatment Gait belt    Activity Tolerance Patient tolerated treatment well    Behavior During Therapy [facility] for tasks assessed/performed                  Past Medical History:  Diagnosis Date   CERVICAL POLYP 03/11/2008   Qualifier: Diagnosis of  By: [name] MD, [name]    Colon polyps 2005   on colonscopy [name]   Fibroid 2004   Per Dr. [name]   History of shingles    face and mouth   Hx of skin cancer, basal cell    Rosacea    Sciatica of left side 09/28/2013   Scoliosis    noted on mri done for back pain   Past Surgical History:  Procedure Laterality Date   BUNIONECTOMY     Patient Active Problem List   Diagnosis Date Noted   Buttock wound, left, initial encounter 11/02/2022   Orthostatic hypotension 08/13/2022   Neurogenic bowel 05/03/2022   Spasticity 05/03/2022   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/03/2022   Nerve pain 05/03/2022   Medication monitoring encounter 01/08/2022   Neurogenic bladder 10/11/2021   Urinary incontinence 10/11/2021   ESBL (extended spectrum beta-lactamase) producing bacteria infection 10/09/2021   Recurrent UTI 10/09/2021   Quadriplegia, C5-C7 incomplete (HCC) 01/16/2021   History of spinal fracture 01/16/2021   Suprapubic catheter (HCC) 01/16/2021   Encounter for routine gynecological examination 09/28/2013   Onychomycosis 09/28/2013   Foot deformity, acquired  03/26/2012   Encounter for preventive health examination 12/25/2010   ROSACEA 08/25/2009   Disturbance in sleep behavior 03/11/2008   SKIN CANCER, HX OF 03/11/2008   DYSURIA, HX OF 03/11/2008   Hyperlipidemia 02/10/2007   CERVICALGIA 02/10/2007    ONSET DATE: 09/28/2022 (most recent referral)  REFERRING DIAG: G82.54 (ICD-10-CM) - Quadriplegia, C5-C7, incomplete (HCC)  THERAPY DIAG:  Other lack of coordination  Muscle weakness (generalized)  Abnormal posture  Other abnormalities of gait and mobility  Quadriplegia, C5-C7 incomplete (HCC)  Rationale for Evaluation and Treatment: Rehabilitation  SUBJECTIVE:  SUBJECTIVE STATEMENT: Pt did see wound care today and her wound is mostly healed, is going back in 10 days to see if wound 100% healed so that she can return to [place]. Pt no longer using wound vac.  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 Yes: NPRS scale: 3/10 Pain location: forearms to fingertips Pain description: constant, pinprick/tingling Aggravating factors: nighttime Relieving factors: nothing, sometimes medicines  PRECAUTIONS: Fall; suprapubic catheter (she wants to get this removed meaning she needs to get to and from the toilet); she has had minor heat sensation when needing to complete her bowel program-possible AD?  WEIGHT BEARING RESTRICTIONS: No-pt was in stander at most recent therapy in [place] last week.  She will have a bone density done soon w/ Dr. [name].  FALLS: Has patient fallen in last 6 months? No  LIVING ENVIRONMENT: Lives with: lives with their spouse and live-in nurse [name] Lives in: House/apartment-townhouse Stairs:  No-level entry, but multi-level home 4 stories w/ elevator Has following equipment at home: Wheelchair (power), Wheelchair (manual), Grab bars, and standing frame, sliding board, transport shower chair, handheld shower head, leg strap-pt reports she no longer finds this helpful  PLOF: Requires assistive device for independence, Needs assistance with ADLs, Needs assistance with homemaking, and Needs assistance with transfers  OCCUPATION:  Writer-uses Dragon to dictate  PATIENT GOALS: "Make my right leg work."  Stand and pivot so she can more easily access a toilet.  OBJECTIVE:   DIAGNOSTIC FINDINGS: No recent relevant imaging.  Bone density scheduled 10/15/2022.  COGNITION: Overall cognitive status: Within functional limits for tasks assessed   SENSATION: Light touch: Diminished ability to distinguish sharp and dull, but able to distinguish light touch from injury level down accurately  COORDINATION: Not formally assessed.  EDEMA:  Well managed w/ lymphatic massage and compression stockings.  MUSCLE TONE: Pt has intermittent clonus during transfers.  POSTURE: rounded shoulders, posterior pelvic tilt, and right thoracic scoliosis   LOWER EXTREMITY ROM:     Passive  Right 10/20/22 Left 10/20/22  Hip flexion [facility]  Hip extension    Hip abduction    Hip adduction    Hip internal rotation [facility] WFL  Hip external rotation [facility]  Knee flexion [facility] WFL  Knee extension [facility] WFL  Ankle dorsiflexion Slight PF contracture Slight PF contracture  Ankle plantarflexion    Ankle inversion    Ankle eversion     (Blank rows = not tested)    LOWER EXTREMITY MMT:    MMT Right 10/20/22 Left 10/20/22  Hip flexion 1 2+  Hip extension    Hip abduction    Hip adduction    Hip internal rotation    Hip external rotation    Knee flexion 2- 3  Knee extension 2- 3  Ankle dorsiflexion 0 3  Ankle plantarflexion    Ankle inversion    Ankle eversion     (Blank rows = not tested)   BED  MOBILITY:  Sit to supine Mod A Supine to sit Mod A Rolling to Right Mod A Rolling to Left Mod A Undulating mattress for wound management on standard bed (elevated-so often doing uphill sliding board transfers); she would like to continue working on sitting up independently, she has been working on rolling, needs less assistance w/ this when someone props her leg into hooklying; would like something to  help her pull her left leg to her butt for stretching as well as bed mobility.  FUNCTIONAL TESTS:  None relevant to pt's current functional level and abilities.  PATIENT SURVEYS:  None completed due to time.  TODAY'S TREATMENT:        Slide board transfer PWC to mat table with 25" SB, pt able to place SB independently and perform transfer with min A for LLE management.  Sit to stand to stedy from lower height mat table with min A. Pt tolerates standing about 3 min in stedy with min A, x 30 sec, x 1 min with physical assist needed to lock L knee by final repetition.  Sit to stands x 4 reps with mod A x 2 reps and min A x 2 reps to work on activation of musculature needed to initiate standing as well as anterior weight shift, did slightly elevate mat table for this. Pt with no signs/symptoms of OH during standing this session so BP not assessed.  Slide board transfers mat table to [facility] with pt placing board at SBA level. Pt able to perform a bump transfer PWC uphill to mat table with just some assist needed for management of BLE, focus on head/hip relationship during transfer. Pt does have difficulty performing this transfer and clearing her buttocks, can benefit from continued practice.  PATIENT EDUCATION: Education details: Insurance account manager at home.   Person educated: Patient and Arts administrator Education method: Explanation Education comprehension: verbalized understanding  HOME EXERCISE PROGRAM: Will be established as needed as pt has done continuous therapy and is working towards  functional tasks.  GOALS: Goals reviewed with patient? Yes   NEW SHORT TERM GOALS=LONG TERM GOALS due to length of POC   NEW LONG TERM GOALS:  Target date: 01/12/2023  Pt will perform squat pivot transfer w/ no more than modA in order to improve access to home environment for toilet transfers. Baseline: minA for lateral scoot transfer, pt able to clear bottom intermittently; min A for "bump" transfer with assist just needed for LE management (7/2) Goal status: IN PROGRESS  2.  Pt will report wound healing in order to return to day program at [facility]. Baseline:  Ischial wound, pt reports wound is healing but does have tunneling so she continues to utilize a wound vac and wound care services (7/2) Goal status: IN PROGRESS  3.  Pt will perform sit to stand transfer with no more than mod A with LRAD to demonstrate improved capacity for higher level transfers and access to uneven surfaces Baseline: max A to stand to stedy as of 7/2 Goal status: IN PROGRESS  4.  Pt will demonstrate ability to place slide board independently in preparation for slide board transfers Baseline: dependent for placement (7/2) Goal status: INITIAL  5.  Pt and caregiver will demonstrate independence with use of patient's personal NMES device for safe muscle activation and strengthening in her home environment Baseline: min cueing for safe setup (7/2) Goal status: INITIAL    ASSESSMENT:  CLINICAL IMPRESSION: Emphasis of skilled PT session on working on increasing independence with slide board transfers and placement of board for transfers. Pt is able to place board independently when seated on mat table, does have increased difficulty when seated in her PWC due to inability to lean as far laterally. Pt exhibits good ability to maneuver the shorter SB as well with only one LOB posteriorly while seated on mat table. Pt continues to progress with her tolerance for standing and exhibits  improved LE strength  and increased time she is able to stand due to improved endurance of LE and core musculature. Pt continues to benefit from skilled therapy services to work towards LTGs. Continue POC.   OBJECTIVE IMPAIRMENTS: decreased balance, decreased coordination, decreased mobility, difficulty walking, decreased ROM, decreased strength, hypomobility, increased edema, impaired flexibility, impaired sensation, impaired tone, impaired UE functional use, improper body mechanics, postural dysfunction, and pain.   ACTIVITY LIMITATIONS: carrying, lifting, bending, sitting, standing, squatting, stairs, transfers, bed mobility, continence, bathing, toileting, dressing, reach over head, hygiene/grooming, locomotion level, and caring for others  PARTICIPATION LIMITATIONS: meal prep, cleaning, laundry, driving, and community activity  PERSONAL FACTORS: Age, Fitness, Past/current experiences, Time since onset of injury/illness/exacerbation, and 1-2 comorbidities: intermittent AD, neurogenic bowel/bladder on active bladder training  are also affecting patient's functional outcome.   REHAB POTENTIAL: Good  CLINICAL DECISION MAKING: Evolving/moderate complexity  EVALUATION COMPLEXITY: Moderate  PLAN:  PT FREQUENCY: 2x/week  PT DURATION: 8 weeks + 4 weeks (recert)  PLANNED INTERVENTIONS: Therapeutic exercises, Therapeutic activity, Neuromuscular re-education, Balance training, Patient/Family education, Self Care, DME instructions, Electrical stimulation, Wheelchair mobility training, Manual therapy, and Re-evaluation  PLAN FOR NEXT SESSION:  NMES to LE (making sure pt and [name] know how to set it up, where to place electrodes, etc), Core strength, static sitting balance-perturbations.  Least restrictive standing--can progress to stedy vs in // bars if skilled +2 available, use of powderboard on mat table for RLE strengthening, wants to work on increasing independence with lateral scoot transfers without SB and also  wants to be able to place SB independently and move her LE more independently; use of leg loops/lifters to increase independence with LE management with transfers and bed mobility if pt is willing to use AE, standing in // bars with knees blocked or knee immobilizers to assess standing without stedy, did she purchase Vive patient turner?, work towards discussion of PT POC; placement strategies/problem solving independence with slideboard placement-enough grip and core stability for decreased assistance? bumping uphill, placing SB while seated in PWC   Peter Congo, PT, DPT, CSRS   12/27/2022, 3:33 PM

## 2022-12-27 NOTE — Therapy (Unsigned)
OUTPATIENT OCCUPATIONAL THERAPY NEURO TREATMENT NOTE Patient Name: FREYA ZOBRIST MRN: [id number] DOB:25-Aug-1950, 72 y.o., female Today's Date: 12/27/2022  PCP: [name], MD  REFERRING PROVIDER: [name], MD  END OF SESSION:  OT End of Session - 12/27/22 1530     Visit Number 21    Number of Visits 25    Date for OT Re-Evaluation 01/07/23    Authorization Type [employer] - requires auth, [place] (additional auth requested)    Authorization Time Period 10/12/22 - 12/10/22    Authorization - Number of Visits 17    Progress Note Due on Visit 26    OT Start Time 1534    OT Stop Time 1616    OT Time Calculation (min) 42 min    Activity Tolerance Patient tolerated treatment well    Behavior During Therapy [facility] for tasks assessed/performed              Past Medical History:  Diagnosis Date   CERVICAL POLYP 03/11/2008   Qualifier: Diagnosis of  By: [name] MD, [name]    Colon polyps 2005   on colonscopy [name]   Fibroid 2004   Per Dr. [name]   History of shingles    face and mouth   Hx of skin cancer, basal cell    Rosacea    Sciatica of left side 09/28/2013   Scoliosis    noted on mri done for back pain   Past Surgical History:  Procedure Laterality Date   BUNIONECTOMY     Patient Active Problem List   Diagnosis Date Noted   Buttock wound, left, initial encounter 11/02/2022   Orthostatic hypotension 08/13/2022   Neurogenic bowel 05/03/2022   Spasticity 05/03/2022   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/03/2022   Nerve pain 05/03/2022   Medication monitoring encounter 01/08/2022   Neurogenic bladder 10/11/2021   Urinary incontinence 10/11/2021   ESBL (extended spectrum beta-lactamase) producing bacteria infection 10/09/2021   Recurrent UTI 10/09/2021   Quadriplegia, C5-C7 incomplete (HCC) 01/16/2021   History of spinal fracture 01/16/2021   Suprapubic catheter (HCC) 01/16/2021   Encounter for routine gynecological examination 09/28/2013   Onychomycosis  09/28/2013   Foot deformity, acquired 03/26/2012   Encounter for preventive health examination 12/25/2010   ROSACEA 08/25/2009   Disturbance in sleep behavior 03/11/2008   SKIN CANCER, HX OF 03/11/2008   DYSURIA, HX OF 03/11/2008   Hyperlipidemia 02/10/2007   CERVICALGIA 02/10/2007    ONSET DATE: 07/28/2020  Date of Referral 09/28/22  REFERRING DIAG: G82.54 (ICD-10-CM) - Quadriplegia, C5-C7, incomplete (HCC)  THERAPY DIAG:  Muscle weakness (generalized)  Other lack of coordination  Abnormal posture  Rationale for Evaluation and Treatment: Rehabilitation  SUBJECTIVE:   SUBJECTIVE STATEMENT:   Patient believes she is good to go with the knitting yarn ring s/p trials last week.  She reports being off the wound vac and returning to MD in 10 days for hopefully the final visit.   Pt accompanied by:  Live in Caregiver - [name]   PERTINENT HISTORY: "Pt is a 72 yr old L handed female with hx of incomplete quadriplegia- 2/14 2022- fleeing the police in [place] on passenger 100 (high speed) miles/hour,  [name]; neurogenic bowel and bladder and spasticity; [name], [name] Here for f/u on Incomplete quadriplegia"  Referral from MD 09/28/22 states, "Please eval and treat for ADLs and higher level mobility."  PRECAUTIONS: Fall; suprapubic catheter (she wants to get this removed meaning she needs to get  to and from the toilet); she has had minor heat sensation when needing to complete her bowel program-possible AD?   WEIGHT BEARING RESTRICTIONS: No-pt was in stander at most recent therapy in [place] last week.  She will have a bone density done soon w/ Dr. [name].   PAIN:  Patient reports chronic pain 3/10 fingers to elbow bilaterally managed by medication and will inform therapy staff of any changes.  FALLS: Has patient fallen in last 6 months? No  LIVING ENVIRONMENT: Lives with: lives with their family - husband [name] and with an adult companion s/p moving back  up from [place] x10 months Lives in: House/apartment Stairs:  4 story town house with an Engineer, structural with threshold adjustments, roll in shower with transport chair Has following equipment at home: Wheelchair (power) - with seat height adjustments to access counters and reclining option, Wheelchair (manual), transport WC, shower chair, and Ramped entry, handheld showerhead with rails around toilet, had Michiel Sites but is no longer in need of it, has slide boards x3  PLOF: Requires assistive device for independence, Needs assistance with ADLs, Needs assistance with homemaking, Needs assistance with gait, and Needs assistance with transfers; 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  Used to like to knit, sew and bake.  PATIENT GOALS: Wants to be able to type - currently using advanced Dragon dictation at times but prefers to type at times.  She would like to be able to write better and is interested in resuming some leisure activities such as Archivist and baking.  She also wants to keep working on being able to cut her own food and on her UE strength.   OBJECTIVE:   HAND DOMINANCE: Left  ADLs: Overall ADLs: Patient has a live in caregiver  Transfers/ambulation related to ADLs: Mod assist with sliding board transfers (previously [employer] lift).  Eating: Has a rocker knife that she can use. Used to use adapted utensils but now uses regular utensils but still will get assistance to cut food ie) when eating out.  Grooming: can brush her own hair but unable to manage jewelry ie) earrings  UB Dressing: can zip/unzip after it has been started, unable to manage buttons herself, Caregiver assists but if she has extra time, she can put on her bra, and a loose fitting pullover shirt/t-shirt  LB Dressing: dependent for LB dressing in bed and with special sock donner for LE compression garments   Toileting: bladder trained with suprapubic catheter which she clamps off.  Dependent for bowel incontinence  care.  Bathing: Sponge bath with adult washclothes.  Can bath UB with back scrubber for most of her back.  Needs help with feet (mentioned she might need a separate brush for feet)   Tub Shower transfers: Min-mod assist with slide board  Equipment: Shower seat with back, Walk in shower, bed side commode, Reacher, Sock aid, Long handled sponge, and Feeding equipment  IADLs: --  Shopping: Assisted by caregiver  Light housekeeping: Has housekeeper that comes monthly  Meal Prep: previously enjoyed baking. Assisted by caregiver but described recent success at reheating a meal for herself after getting food out of the fridge/freezer from her WC.  Community mobility: Dependent  Medication management: Caregiver sorts them into pillbox but she is very aware of her medications   Financial management: Patient manages her own finances  Handwriting: Increased time and has a pen with a little grip  MOBILITY STATUS: Independent with power mobiity  POSTURE COMMENTS:  No Significant postural limitations  and forward head Sitting balance: Supports self independently with both Ues  ACTIVITY TOLERANCE: Activity tolerance: Fair - MMT WFL but has limited sustained tolerance for ongoing use of UEs  FUNCTIONAL OUTCOME MEASURES: 10/20/22 QuickDash 31.8 points   UPPER EXTREMITY ROM:   AROM - WFL without obvious contractures, some digital flexion noted but PROM WNL   AROM Right (eval) Left (eval)  Shoulder flexion [facility]  Shoulder abduction [facility]  Elbow flexion [facility] WFL  Elbow extension [facility]  Wrist flexion [facility] WFL  Wrist extension WFL WFL  Ulnar deviation WFL Decreased ulnar  deviation past midline  Wrist pronation [facility] WFL  Wrist supination [facility]  Digit Composite Flexion Lacks full AROM:   1st digit - 5 cm   3rd digit -1 cm   4th digit - 2 cm  Lacks full AROM:   1st digit - 1.5 cm  5th digit - 3 cm   Digit Composite Extension [facility]  Digit Opposition Opposition to index  finger only Lacks to pinkie due to limited DIP pinkie flexion  (Blank rows = not tested)  UPPER EXTREMITY MMT:   Grossly WFL - Endurance limited R tricep strength > than L but L UE generally stronger than R UE  MMT Right (eval) Left (eval)  Shoulder flexion 4/5 4/5  Shoulder abduction 4/5 4/5  Elbow flexion 4/5 4/5  Elbow extension 4/5 4-/5  (Blank rows = not tested)  HAND FUNCTION: Grip strength: Right: 4.8 lbs; Left: 20.9 lbs 11/17/22 Right - 7.2 lbs  COORDINATION: Finger Nose Finger test: R generally WFL, L WNL Box and Blocks:  Right 28 blocks, Left 37 blocks R hand finger eventually cramps and dexterity worsens in the cold  SENSATION: Light touch: Impaired  - patient   EDEMA: NA for UEs but LE has poor lymph drainage with custom compression garments   MUSCLE TONE: Generally WFL but will assess further  COGNITION: Overall cognitive status: Within functional limits for tasks assessed  VISION: Subjective report: Patent wears progressive lens/glasses.  Denies diplopia or vision changes but has eye exam in the next couple of months. Baseline vision: Wears glasses all the time  VISION ASSESSMENT: WFL  EVALUATION OBSERVATIONS: Patient independent with power WC navigation within clinic.  Patient is well-kept with foley catheter in place.  She has slight limitations in full extension of digits but PROM is WNL and she has splints at home that she said she will bring for OT staff to assess.  She has functional ROM of B UEs to reach her head, behind her back and to cross midline to assist with ADLs.     TODAY'S TREATMENT:                                                                                                                           -Therapeutic Activities:  WC pushups and repositioning to improve trunk position with resident performing excellent WC push up with both hands simultaneously.  -  Therapeutic Exercises UE finger ROM with thumb stabilization and positioning  to improve finger opposition, isolated finger movement and  Isolated figner movmeents completed over open plastic container where she can stabilize finger son the edge of the box and move individual fingers up and down and side to side as able in the container.   Trial 1-- L UE Grip strength: Forearm pronated 18.5, Neutral 18.0, Forearm supinated 21.1 Trial 2 -- Forearm pronated 16.9 Neutral 20.5 Forearm supinated 18.7  PATIENT EDUCATION: Education details: Modifications for knitting person educated: Patient and Caregiver Live in caregiver - [name] Education method: Explanation, Demonstration, and Verbal cues,  Education comprehension: verbalized understanding and needs further education   HOME EXERCISE PROGRAM: 11/25/2022: NMES use  12/02/22: All previous HEPs combined to 1 complete List through MedBridge Access Code: [email or web address] URL: [email or web address] Date: 12/02/2022 Prepared by: Amada Kingfisher   GOALS:   SHORT TERM GOALS: Target date: 11/09/22  Patient will be able to use AE/modified techniques to cut soft foods small enough for oral intake. Baseline: Caregiver/spouse assist Goal status: IN PROGRESS  2.  Patient will be able to use AE/modified positioning to complete word search by drawing lines through words with 0 errors. Baseline: TBD Goal status: NOT MET - Goal discontinued as patient focusing on writing more than word search ie) note taking etc.  3.  Patient will verbalize understanding of good pressure relief schedule (for 15 to 60 seconds every 15 to 60 minutes) to help with wound healing. Baseline: Patient did not change positioning in > 45 minutes of OT eval. Goal status: MET  4.  Patient will be assisted to explore modifications for leisure tasks (knit/sew/bake) with good return demonstration. Baseline: Minimal involvement Goal status: IN PROGRESS  11/17/22 - trialled knitting  12/02/22 & 12/20/22 - continued AE exploration  5.  Patient will demonstrate  independence with HEP for UE strengthening, coordination and ROM to prevent contractures and maintain strength for transfers and ADLs. Baseline: Previous HEPs have been established but need to be reviewed and updated.  Goal status: IN PROGRESS  11/17/22 working on lists and categorizing activities 12/02/22 Comprehensive Med[employer] list compiled  6.  Patient will be assessed for typing speed/dexterity. Baseline: Patient reports difficulty with typing with all her fingers. Goal status: IN PROGRESS  7. Patient will complete Quick Dash UE assessment. Baseline: TBD Goal Status: 10/20/22 Discontinued - see scores above   LONG TERM GOALS: Target date: 01/07/23  Patient will complete 1 small craft/week r/t her leisure interests to work on FMS daily. Baseline: Minimal involvement Goal status: IN PROGRESS  2.  Patient will improve B coordination for increased typing speed/dexterity x 1-2 WPM. Baseline: TBD Goal status: IN PROGRESS  11/17/22 daily engagement with typing at home although modified use of digits for speed  3.  Patient will improve B UE coordination, strength and functional use to bake cookies with setup assistance and AE/modified techniques as appropriate.  Baseline: Not performed. Goal status: IN PROGRESS  4.  Patient will report no more than moderate difficulty using a knife to foods such as chicken, small enough for oral intake using AE and strategies as needed. Baseline: Caregiver/spouse assist Goal status: IN PROGRESS  5.  Patient will be able to use AE/modified positioning to complete 4 sentences with 100% legibility. Baseline: Subjective reports of difficulty with writing Goal status: IN PROGRESS   ASSESSMENT:  CLINICAL IMPRESSION:   Pt demonstrating good response to incorporation of adaptive equipment and strategies as needed to improve independence and safety  with ADLs and IADLs.   PERFORMANCE DEFICITS: in functional skills including ADLs, IADLs, coordination,  dexterity, strength, muscle spasms, Fine motor control, Gross motor control, continence, skin integrity, and UE functional use,   IMPAIRMENTS: are limiting patient from ADLs, IADLs, work, and leisure.   CO-MORBIDITIES: has co-morbidities such as incontinence and wound  that affects occupational performance. Patient will benefit from skilled OT to address above impairments and improve overall function.  REHAB POTENTIAL: Fair due to chronicity of injury   PLAN:  OT FREQUENCY: 2x/week  OT DURATION: additional 4 weeks  PLANNED INTERVENTIONS: self care/ADL training, therapeutic exercise, therapeutic activity, neuromuscular re-education, manual therapy, passive range of motion, balance training, functional mobility training, splinting, patient/family education, energy conservation, coping strategies training, and DME and/or AE instructions  RECOMMENDED OTHER SERVICES: None at this time  CONSULTED AND AGREED WITH PLAN OF CARE: Patient and family member/caregiver  PLAN FOR NEXT SESSION: hair drying and knitting (how are they going)  Review positioning of R hand to help with finger opposition and isolation of finger joints.  Continue FM tasks to improve progression of ADLs/IADLS hobby/craft activities (ie baking, yarn activities & baking).   Victorino Sparrow, OT 12/27/2022, 5:30 PM
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
## 2022-12-28 NOTE — Progress Notes (Signed)
[name], FLEIG ([id number]) [id number] Page 1 of 8 Visit Report for 12/27/2022 Arrival Information Details Patient Name: Date of Service: [name], [name] 12/27/2022 12:30 PM Medical Record Number: [id number] Patient Account Number: [id number] Date of Birth/Sex: Treating RN: 1950-07-28 (72 y.o. F) Primary Care Trishna Cwik: Berniece Andreas Other Clinician: Referring Wynnie Pacetti: Treating Adali Pennings/Extender: Skeet Latch in Treatment: 11 Visit Information History Since Last Visit Added or deleted any medications: No Patient Arrived: Wheel Chair Any new allergies or adverse reactions: No Arrival Time: 12:44 Had a fall or experienced change in No Accompanied By: caregiver activities of daily living that may affect Transfer Assistance: Transfer Board risk of falls: Patient Identification Verified: Yes Signs or symptoms of abuse/neglect since last visito No Secondary Verification Process Completed: Yes Hospitalized since last visit: No Patient Requires Transmission-Based Precautions: No Implantable device outside of the clinic excluding No Patient Has Alerts: No cellular tissue based products placed in the center since last visit: Has Dressing in Place as Prescribed: Yes Pain Present Now: No Electronic Signature(s) Signed: 12/27/2022 4:10:29 PM By: Thayer Dallas Entered By: Thayer Dallas on 12/27/2022 12:45:23 -------------------------------------------------------------------------------- Clinic Level of Care Assessment Details Patient Name: Date of Service: Carmen NDO N, [name] 12/27/2022 12:30 PM Medical Record Number: [id number] Patient Account Number: [id number] Date of Birth/Sex: Treating RN: 19-Jul-1950 (72 y.o. [name] Primary Care Harjit Leider: Berniece Andreas Other Clinician: Referring Gaylon Bentz: Treating Deshawn Skelley/Extender: Skeet Latch in Treatment: 11 Clinic Level of Care Assessment  Items TOOL 4 Quantity Score X- 1 0 Use when only an EandM is performed on FOLLOW-UP visit ASSESSMENTS - Nursing Assessment / Reassessment []  - 0 Reassessment of Co-morbidities (includes updates in patient status) X- 1 5 Reassessment of Adherence to Treatment Plan ASSESSMENTS - Wound and Skin A ssessment / Reassessment X - Simple Wound Assessment / Reassessment - one wound 1 5 []  - 0 Complex Wound Assessment / Reassessment - multiple wounds []  - 0 Dermatologic / Skin Assessment (not related to wound area) ASSESSMENTS - Focused Assessment []  - 0 Circumferential Edema Measurements - multi extremities []  - 0 Nutritional Assessment / Counseling / Intervention KIMBER, FRITTS ([id number]) [id number] Page 2 of 8 []  - 0 Lower Extremity Assessment (monofilament, tuning fork, pulses) []  - 0 Peripheral Arterial Disease Assessment (using hand held doppler) ASSESSMENTS - Ostomy and/or Continence Assessment and Care []  - 0 Incontinence Assessment and Management []  - 0 Ostomy Care Assessment and Management (repouching, etc.) PROCESS - Coordination of Care X - Simple Patient / Family Education for ongoing care 1 15 []  - 0 Complex (extensive) Patient / Family Education for ongoing care X- 1 10 Staff obtains Chiropractor, Records, T Results / Process Orders est []  - 0 Staff telephones HHA, Nursing Homes / Clarify orders / etc []  - 0 Routine Transfer to another Facility (non-emergent condition) []  - 0 Routine Hospital Admission (non-emergent condition) []  - 0 New Admissions / Manufacturing engineer / Ordering NPWT Apligraf, etc. , []  - 0 Emergency Hospital Admission (emergent condition) X- 1 10 Simple Discharge Coordination []  - 0 Complex (extensive) Discharge Coordination PROCESS - Special Needs []  - 0 Pediatric / Minor Patient Management []  - 0 Isolation Patient Management []  - 0 Hearing / Language / Visual special needs []  - 0 Assessment of  Community assistance (transportation, D/C planning, etc.) []  - 0 Additional assistance / Altered mentation []  - 0 Support Surface(s) Assessment (bed, cushion, seat, etc.) INTERVENTIONS - Wound Cleansing / Measurement X - Simple  Wound Cleansing - one wound 1 5 []  - 0 Complex Wound Cleansing - multiple wounds X- 1 5 Wound Imaging (photographs - any number of wounds) []  - 0 Wound Tracing (instead of photographs) X- 1 5 Simple Wound Measurement - one wound []  - 0 Complex Wound Measurement - multiple wounds INTERVENTIONS - Wound Dressings X - Small Wound Dressing one or multiple wounds 1 10 []  - 0 Medium Wound Dressing one or multiple wounds []  - 0 Large Wound Dressing one or multiple wounds []  - 0 Application of Medications - topical []  - 0 Application of Medications - injection INTERVENTIONS - Miscellaneous []  - 0 External ear exam []  - 0 Specimen Collection (cultures, biopsies, blood, body fluids, etc.) []  - 0 Specimen(s) / Culture(s) sent or taken to Lab for analysis []  - 0 Patient Transfer (multiple staff / Nurse, adult / Similar devices) []  - 0 Simple Staple / Suture removal (25 or less) []  - 0 Complex Staple / Suture removal (26 or more) []  - 0 Hypo / Hyperglycemic Management (close monitor of Blood Glucose) [name], [name] ([id number]) [id number] Page 3 of 8 []  - 0 Ankle / Brachial Index (ABI) - do not check if billed separately X- 1 5 Vital Signs Has the patient been seen at the hospital within the last three years: Yes Total Score: 75 Level Of Care: New/Established - Level 2 Electronic Signature(s) Signed: 12/27/2022 4:41:34 PM By: Samuella Bruin Entered By: Samuella Bruin on 12/27/2022 14:48:00 -------------------------------------------------------------------------------- Encounter Discharge Information Details Patient Name: Date of Service: Carmen NDO N, [name] 12/27/2022 12:30 PM Medical Record Number:  [id number] Patient Account Number: [id number] Date of Birth/Sex: Treating RN: 30-Dec-1950 (72 y.o. [name] Primary Care [name]: Berniece Andreas Other Clinician: Referring [name]: Treating Emrie Gayle/Extender: Skeet Latch in Treatment: 11 Encounter Discharge Information Items Discharge Condition: Stable Ambulatory Status: Wheelchair Discharge Destination: Home Transportation: Private Auto Accompanied By: caregiver Schedule Follow-up Appointment: Yes Clinical Summary of Care: Patient Declined Electronic Signature(s) Signed: 12/27/2022 4:41:34 PM By: Samuella Bruin Entered By: Samuella Bruin on 12/27/2022 14:48:27 -------------------------------------------------------------------------------- Lower Extremity Assessment Details Patient Name: Date of Service: Carmen NDO N, [name] 12/27/2022 12:30 PM Medical Record Number: [id number] Patient Account Number: [id number] Date of Birth/Sex: Treating RN: 11-Apr-1951 (72 y.o. F) Primary Care Haruka Kowaleski: Berniece Andreas Other Clinician: Referring Siennah Barrasso: Treating Johntavius Shepard/Extender: Skeet Latch in Treatment: 11 Electronic Signature(s) Signed: 12/27/2022 4:10:29 PM By: Thayer Dallas Entered By: Thayer Dallas on 12/27/2022 12:45:57 -------------------------------------------------------------------------------- Multi Wound Chart Details Patient Name: Date of Service: Carmen NDO N, [name] 12/27/2022 12:30 PM Medical Record Number: [id number] Patient Account Number: [id number] VALOREE, AGENT ([id number]) [phone].pdf Page 4 of 8 Date of Birth/Sex: Treating RN: May 22, 1951 (72 y.o. F) Primary Care Talen Poser: Berniece Andreas Other Clinician: Referring Dmya Long: Treating Yancey Pedley/Extender: Skeet Latch in Treatment: 11 Vital Signs Height(in): 64 Pulse(bpm): 76 Weight(lbs): 115 Blood Pressure(mmHg): 85/56 Body Mass  Index(BMI): 19.7 Temperature(F): 98.4 Respiratory Rate(breaths/min): 18 [1:Photos:] [N/A:N/A] Left Gluteus N/A N/A Wound Location: Pressure Injury N/A N/A Wounding Event: Pressure Ulcer N/A N/A Primary Etiology: Cataracts, Lymphedema, N/A N/A Comorbid History: Hypotension, Quadriplegia 07/15/2022 N/A N/A Date Acquired: 11 N/A N/A Weeks of Treatment: Open N/A N/A Wound Status: No N/A N/A Wound Recurrence: 0.1x0.4x0.3 N/A N/A Measurements L x W x D (cm) 0.031 N/A N/A A (cm) : rea 0.009 N/A N/A Volume (cm) : 91.80% N/A N/A % Reduction in A rea: 98.20% N/A N/A % Reduction in Volume: Category/Stage  III N/A N/A Classification: Medium N/A N/A Exudate A mount: Serosanguineous N/A N/A Exudate Type: red, brown N/A N/A Exudate Color: Distinct, outline attached N/A N/A Wound Margin: Large (67-100%) N/A N/A Granulation A mount: Red, Pink N/A N/A Granulation Quality: None Present (0%) N/A N/A Necrotic A mount: Fat Layer (Subcutaneous Tissue): Yes N/A N/A Exposed Structures: Fascia: No Tendon: No Muscle: No Joint: No Bone: No Small (1-33%) N/A N/A Epithelialization: Excoriation: No N/A N/A Periwound Skin Texture: Induration: No Callus: No Crepitus: No Rash: No Scarring: No Maceration: No N/A N/A Periwound Skin Moisture: Dry/Scaly: No Atrophie Blanche: No N/A N/A Periwound Skin Color: Cyanosis: No Ecchymosis: No Erythema: No Hemosiderin Staining: No Mottled: No Pallor: No Rubor: No Treatment Notes Electronic Signature(s) Signed: 12/28/2022 4:51:26 PM By: Geralyn Corwin DO Entered By: Geralyn Corwin on 12/27/2022 13:08:44 [name] ([id number]) [id number] Page 5 of 8 -------------------------------------------------------------------------------- Multi-Disciplinary Care Plan Details Patient Name: Date of Service: Carmen NDO N, [place] [name] 12/27/2022 12:30 PM Medical Record Number: [id number] Patient Account Number:  [id number] Date of Birth/Sex: Treating RN: 01-20-1951 (72 y.o. [name] Primary Care Tinamarie Przybylski: Berniece Andreas Other Clinician: Referring Keeshia Sanderlin: Treating Zeev Deakins/Extender: Skeet Latch in Treatment: 11 Active Inactive Nutrition Nursing Diagnoses: Potential for alteratiion in Nutrition/Potential for imbalanced nutrition Goals: Patient/caregiver agrees to and verbalizes understanding of need to obtain nutritional consultation Date Initiated: 10/11/2022 Target Resolution Date: 01/14/2023 Goal Status: Active Interventions: Provide education on elevated blood sugars and impact on wound healing Provide education on nutrition Treatment Activities: Patient referred to Primary Care Physician for further nutritional evaluation : 10/11/2022 Notes: Pressure Nursing Diagnoses: Potential for impaired tissue integrity related to pressure, friction, moisture, and shear Goals: Patient will remain free from development of additional pressure ulcers Date Initiated: 10/11/2022 Target Resolution Date: 01/14/2023 Goal Status: Active Patient/caregiver will verbalize understanding of pressure ulcer management Date Initiated: 10/11/2022 Date Inactivated: 12/27/2022 Target Resolution Date: 10/22/2022 Goal Status: Met Interventions: Assess: immobility, friction, shearing, incontinence upon admission and as needed Assess offloading mechanisms upon admission and as needed Provide education on pressure ulcers Notes: Electronic Signature(s) Signed: 12/27/2022 4:41:34 PM By: Samuella Bruin Entered By: Samuella Bruin on 12/27/2022 13:07:44 -------------------------------------------------------------------------------- Pain Assessment Details Patient Name: Date of Service: Carmen NDO N, [name] 12/27/2022 12:30 PM Medical Record Number: [id number] Patient Account Number: [id number] Date of Birth/Sex: Treating RN: November 27, 1950 (72 y.o. F) Primary Care Lizann Edelman: Berniece Andreas Other Clinician: TREANA, LACOUR ([id number]) [id number] Page 6 of 8 Referring Conswella Bruney: Treating Kaylena Pacifico/Extender: Skeet Latch in Treatment: 11 Active Problems Location of Pain Severity and Description of Pain Patient Has Paino No Site Locations Pain Management and Medication Current Pain Management: Electronic Signature(s) Signed: 12/27/2022 4:10:29 PM By: Thayer Dallas Entered By: Thayer Dallas on 12/27/2022 12:45:50 -------------------------------------------------------------------------------- Patient/Caregiver Education Details Patient Name: Date of Service: Carmen NDO N, [name] 7/15/2024andnbsp12:30 PM Medical Record Number: [id number] Patient Account Number: [id number] Date of Birth/Gender: Treating RN: [birth year] (72 y.o. [name] Primary Care Physician: Berniece Andreas Other Clinician: Referring Physician: Treating Physician/Extender: Skeet Latch in Treatment: 11 Education Assessment Education Provided To: Patient Education Topics Provided Safety: Methods: Explain/Verbal Responses: Reinforcements needed, State content correctly Electronic Signature(s) Signed: 12/27/2022 4:41:34 PM By: Samuella Bruin Entered By: Samuella Bruin on 12/27/2022 13:07:05 [name] ([id number]) [id number] Page 7 of 8 -------------------------------------------------------------------------------- Wound Assessment Details Patient Name: Date of Service: Carmen NDO N, [place] [name] 12/27/2022 12:30 PM Medical Record Number: [id number] Patient Account Number: [id number]  Date of Birth/Sex: Treating RN: September 02, 1950 (72 y.o. F) Primary Care Denzell Colasanti: Berniece Andreas Other Clinician: Referring Lealand Elting: Treating Khamiyah Grefe/Extender: Skeet Latch in Treatment: 11 Wound Status Wound Number: 1 Primary Etiology: Pressure Ulcer Wound  Location: Left Gluteus Wound Status: Open Wounding Event: Pressure Injury Comorbid History: Cataracts, Lymphedema, Hypotension, Quadriplegia Date Acquired: 07/15/2022 Weeks Of Treatment: 11 Clustered Wound: No Photos Wound Measurements Length: (cm) 0 Width: (cm) 0 Depth: (cm) 0 Area: (cm) Volume: (cm) .1 % Reduction in Area: 91.8% .4 % Reduction in Volume: 98.2% .3 Epithelialization: Small (1-33%) 0.031 Tunneling: No 0.009 Undermining: No Wound Description Classification: Category/Stage III Wound Margin: Distinct, outline attached Exudate Amount: Medium Exudate Type: Serosanguineous Exudate Color: red, brown Foul Odor After Cleansing: No Slough/Fibrino Yes Wound Bed Granulation Amount: Large (67-100%) Exposed Structure Granulation Quality: Red, Pink Fascia Exposed: No Necrotic Amount: None Present (0%) Fat Layer (Subcutaneous Tissue) Exposed: Yes Tendon Exposed: No Muscle Exposed: No Joint Exposed: No Bone Exposed: No Periwound Skin Texture Texture Color No Abnormalities Noted: No No Abnormalities Noted: No Callus: No Atrophie Blanche: No Crepitus: No Cyanosis: No Excoriation: No Ecchymosis: No Induration: No Erythema: No Rash: No Hemosiderin Staining: No Scarring: No Mottled: No Pallor: No Moisture Rubor: No No Abnormalities Noted: No Dry / Scaly: No Maceration: No ANVITHA, HUTMACHER ([id number]) [id number] Page 8 of 8 Treatment Notes Wound #1 (Gluteus) Wound Laterality: Left Cleanser Vashe 5.8 (oz) Discharge Instruction: Cleanse the wound with Vashe prior to applying a clean dressing using gauze sponges, not tissue or cotton balls. Peri-Wound Care Topical Mupirocin Ointment Discharge Instruction: Apply Mupirocin (Bactroban) as instructed Primary Dressing Secondary Dressing Bordered Gauze, 4x4 in Discharge Instruction: Apply over primary dressing as directed. Secured With 44M Medipore H Soft Cloth Surgical T ape, 4 x 10  (in/yd) Discharge Instruction: Secure with tape as directed. Compression Wrap Compression Stockings Add-Ons Electronic Signature(s) Signed: 12/27/2022 4:10:29 PM By: Thayer Dallas Entered By: Thayer Dallas on 12/27/2022 12:58:39 -------------------------------------------------------------------------------- Vitals Details Patient Name: Date of Service: Carmen NDO N, [name] 12/27/2022 12:30 PM Medical Record Number: [id number] Patient Account Number: [id number] Date of Birth/Sex: Treating RN: [birth year] (72 y.o. F) Primary Care Josette Shimabukuro: Berniece Andreas Other Clinician: Referring Chayse Gracey: Treating Yailine Ballard/Extender: Skeet Latch in Treatment: 11 Vital Signs Time Taken: 12:45 Temperature (F): 98.4 Height (in): 64 Pulse (bpm): 76 Weight (lbs): 115 Respiratory Rate (breaths/min): 18 Body Mass Index (BMI): 19.7 Blood Pressure (mmHg): 85/56 Reference Range: 80 - 120 mg / dl Electronic Signature(s) Signed: 12/27/2022 4:10:29 PM By: Thayer Dallas Entered By: Thayer Dallas on 12/27/2022 12:45:44

## 2022-12-30 ENCOUNTER — Ambulatory Visit: Payer: Medicare PPO | Admitting: Occupational Therapy

## 2022-12-30 ENCOUNTER — Ambulatory Visit: Payer: Medicare PPO | Admitting: Physical Therapy

## 2022-12-30 DIAGNOSIS — R29898 Other symptoms and signs involving the musculoskeletal system: Secondary | ICD-10-CM | POA: Diagnosis not present

## 2022-12-30 DIAGNOSIS — M24542 Contracture, left hand: Secondary | ICD-10-CM | POA: Diagnosis not present

## 2022-12-30 DIAGNOSIS — G8254 Quadriplegia, C5-C7 incomplete: Secondary | ICD-10-CM | POA: Diagnosis not present

## 2022-12-30 DIAGNOSIS — M24541 Contracture, right hand: Secondary | ICD-10-CM | POA: Diagnosis not present

## 2022-12-30 DIAGNOSIS — M6281 Muscle weakness (generalized): Secondary | ICD-10-CM

## 2022-12-30 DIAGNOSIS — R29818 Other symptoms and signs involving the nervous system: Secondary | ICD-10-CM | POA: Diagnosis not present

## 2022-12-30 DIAGNOSIS — R278 Other lack of coordination: Secondary | ICD-10-CM | POA: Diagnosis not present

## 2022-12-30 DIAGNOSIS — R293 Abnormal posture: Secondary | ICD-10-CM

## 2022-12-30 DIAGNOSIS — R2689 Other abnormalities of gait and mobility: Secondary | ICD-10-CM | POA: Diagnosis not present

## 2022-12-30 NOTE — Therapy (Signed)
OUTPATIENT OCCUPATIONAL THERAPY NEURO TREATMENT NOTE Patient Name: [name] MRN: [id number] DOB:03-Apr-1951, 72 y.o., female Today's Date: 12/30/2022  PCP: [name], MD  REFERRING PROVIDER: [name], MD  END OF SESSION:  OT End of Session - 12/30/22 1102     Visit Number 22    Number of Visits 25    Date for OT Re-Evaluation 01/07/23    Authorization Type [employer] - requires auth, [place] (additional auth requested)    Progress Note Due on Visit 26    OT Start Time 1103    OT Stop Time 1145    OT Time Calculation (min) 42 min    Equipment Utilized During Treatment New hair dryer    Activity Tolerance Patient tolerated treatment well    Behavior During Therapy WFL for tasks assessed/performed               Past Medical History:  Diagnosis Date   CERVICAL POLYP 03/11/2008   Qualifier: Diagnosis of  By: [name] MD, [name]    Colon polyps 2005   on colonscopy [name]   Fibroid 2004   Per Dr. [name]   History of shingles    face and mouth   Hx of skin cancer, basal cell    Rosacea    Sciatica of left side 09/28/2013   Scoliosis    noted on mri done for back pain   Past Surgical History:  Procedure Laterality Date   BUNIONECTOMY     Patient Active Problem List   Diagnosis Date Noted   Buttock wound, left, initial encounter 11/02/2022   Orthostatic hypotension 08/13/2022   Neurogenic bowel 05/03/2022   Spasticity 05/03/2022   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/03/2022   Nerve pain 05/03/2022   Medication monitoring encounter 01/08/2022   Neurogenic bladder 10/11/2021   Urinary incontinence 10/11/2021   ESBL (extended spectrum beta-lactamase) producing bacteria infection 10/09/2021   Recurrent UTI 10/09/2021   Quadriplegia, C5-C7 incomplete (HCC) 01/16/2021   History of spinal fracture 01/16/2021   Suprapubic catheter (HCC) 01/16/2021   Encounter for routine gynecological examination 09/28/2013   Onychomycosis 09/28/2013   Foot deformity,  acquired 03/26/2012   Encounter for preventive health examination 12/25/2010   ROSACEA 08/25/2009   Disturbance in sleep behavior 03/11/2008   SKIN CANCER, HX OF 03/11/2008   DYSURIA, HX OF 03/11/2008   Hyperlipidemia 02/10/2007   CERVICALGIA 02/10/2007    ONSET DATE: 07/28/2020  Date of Referral 09/28/22  REFERRING DIAG: G82.54 (ICD-10-CM) - Quadriplegia, C5-C7, incomplete (HCC)  THERAPY DIAG:  Muscle weakness (generalized)  Other lack of coordination  Rationale for Evaluation and Treatment: Rehabilitation  SUBJECTIVE:   SUBJECTIVE STATEMENT:   Patient brought the new hair dryer she get s/p suggestion of other OT.   Pt accompanied by:  Live in Caregiver - [name]   PERTINENT HISTORY: "Pt is a 72 yr old L handed female with hx of incomplete quadriplegia- 2/14 2022- fleeing the police in [place] on passenger 100 (high speed) miles/hour,  [name]; neurogenic bowel and bladder and spasticity; [name], [name] Here for f/u on Incomplete quadriplegia"  Referral from MD 09/28/22 states, "Please eval and treat for ADLs and higher level mobility."  PRECAUTIONS: Fall; suprapubic catheter (she wants to get this removed meaning she needs to get to and from the toilet); she has had minor heat sensation when needing to complete her bowel program-possible AD?   WEIGHT BEARING RESTRICTIONS: No-pt was in stander at most recent therapy in  [place] last week.  She will have a bone density done soon w/ Dr. [name].   PAIN:  Patient reports chronic pain 3/10 fingers to elbow bilaterally managed by medication and will inform therapy staff of any changes.  FALLS: Has patient fallen in last 6 months? No  LIVING ENVIRONMENT: Lives with: lives with their family - husband [name] and with an adult companion s/p moving back up from [place] x10 months Lives in: House/apartment Stairs:  4 story town house with an Engineer, structural with threshold adjustments, roll in shower with transport  chair Has following equipment at home: Wheelchair (power) - with seat height adjustments to access counters and reclining option, Wheelchair (manual), transport WC, shower chair, and Ramped entry, handheld showerhead with rails around toilet, had Michiel Sites but is no longer in need of it, has slide boards x3  PLOF: Requires assistive device for independence, Needs assistance with ADLs, Needs assistance with homemaking, Needs assistance with gait, and Needs assistance with transfers; 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  Used to like to knit, sew and bake.  PATIENT GOALS: Wants to be able to type - currently using advanced Dragon dictation at times but prefers to type at times.  She would like to be able to write better and is interested in resuming some leisure activities such as Archivist and baking.  She also wants to keep working on being able to cut her own food and on her UE strength.   OBJECTIVE:   HAND DOMINANCE: Left  ADLs: Overall ADLs: Patient has a live in caregiver  Transfers/ambulation related to ADLs: Mod assist with sliding board transfers (previously [employer] lift).  Eating: Has a rocker knife that she can use. Used to use adapted utensils but now uses regular utensils but still will get assistance to cut food ie) when eating out.  Grooming: can brush her own hair but unable to manage jewelry ie) earrings  UB Dressing: can zip/unzip after it has been started, unable to manage buttons herself, Caregiver assists but if she has extra time, she can put on her bra, and a loose fitting pullover shirt/t-shirt  LB Dressing: dependent for LB dressing in bed and with special sock donner for LE compression garments   Toileting: bladder trained with suprapubic catheter which she clamps off.  Dependent for bowel incontinence care.  Bathing: Sponge bath with adult washclothes.  Can bath UB with back scrubber for most of her back.  Needs help with feet (mentioned she might need a  separate brush for feet)   Tub Shower transfers: Min-mod assist with slide board  Equipment: Shower seat with back, Walk in shower, bed side commode, Reacher, Sock aid, Long handled sponge, and Feeding equipment  IADLs: --  Shopping: Assisted by caregiver  Light housekeeping: Has housekeeper that comes monthly  Meal Prep: previously enjoyed baking. Assisted by caregiver but described recent success at reheating a meal for herself after getting food out of the fridge/freezer from her WC.  Community mobility: Dependent  Medication management: Caregiver sorts them into pillbox but she is very aware of her medications   Financial management: Patient manages her own finances  Handwriting: Increased time and has a pen with a little grip  MOBILITY STATUS: Independent with power mobiity  POSTURE COMMENTS:  No Significant postural limitations and forward head Sitting balance: Supports self independently with both Ues  ACTIVITY TOLERANCE: Activity tolerance: Fair - MMT WFL but has limited sustained tolerance for ongoing use of UEs  FUNCTIONAL OUTCOME  MEASURES: 10/20/22 QuickDash 31.8 points   UPPER EXTREMITY ROM:   AROM - WFL without obvious contractures, some digital flexion noted but PROM WNL   AROM Right (eval) Left (eval)  Shoulder flexion [facility]  Shoulder abduction [facility]  Elbow flexion [facility] WFL  Elbow extension [facility]  Wrist flexion [facility] WFL  Wrist extension WFL WFL  Ulnar deviation WFL Decreased ulnar  deviation past midline  Wrist pronation [facility] WFL  Wrist supination [facility]  Digit Composite Flexion Lacks full AROM:   1st digit - 5 cm   3rd digit -1 cm   4th digit - 2 cm  Lacks full AROM:   1st digit - 1.5 cm  5th digit - 3 cm   Digit Composite Extension [facility]  Digit Opposition Opposition to index finger only Lacks to pinkie due to limited DIP pinkie flexion  (Blank rows = not tested)  UPPER EXTREMITY MMT:   Grossly WFL - Endurance limited R tricep  strength > than L but L UE generally stronger than R UE  MMT Right (eval) Left (eval)  Shoulder flexion 4/5 4/5  Shoulder abduction 4/5 4/5  Elbow flexion 4/5 4/5  Elbow extension 4/5 4-/5  (Blank rows = not tested)  HAND FUNCTION: Grip strength: Right: 4.8 lbs; Left: 20.9 lbs 11/17/22 Right - 7.2 lbs  COORDINATION: Finger Nose Finger test: R generally WFL, L WNL Box and Blocks:  Right 28 blocks, Left 37 blocks R hand finger eventually cramps and dexterity worsens in the cold  SENSATION: Light touch: Impaired  - patient   EDEMA: NA for UEs but LE has poor lymph drainage with custom compression garments   MUSCLE TONE: Generally WFL but will assess further  COGNITION: Overall cognitive status: Within functional limits for tasks assessed  VISION: Subjective report: Patent wears progressive lens/glasses.  Denies diplopia or vision changes but has eye exam in the next couple of months. Baseline vision: Wears glasses all the time  VISION ASSESSMENT: WFL  EVALUATION OBSERVATIONS: Patient independent with power WC navigation within clinic.  Patient is well-kept with foley catheter in place.  She has slight limitations in full extension of digits but PROM is WNL and she has splints at home that she said she will bring for OT staff to assess.  She has functional ROM of B UEs to reach her head, behind her back and to cross midline to assist with ADLs.     TODAY'S TREATMENT:                                                                                                                            - Self Care  Patient engaged in activities to assist and improve ability to participate in hair drawing activities with no new hairdryer/brush combo.  Different size brushes for trials and different positions explored.  Patient is encouraged to use the recline in her wheelchair to sustained overhead reaching activities.  Patient engaged in practicing  with device in both left and right upper  extremity.  She is able to use the hairbrush to scoop her hair into waves while brushing it on the sides and at her bangs.  She is encouraged to work on increasing tolerance to overhead reaching for task in place of dowel rod lifts etc ie) replace some exercises with functional motions.   - Therapeutic Exercises UE finger/thumb ROM activities conducted with some hands on guidance from OTR to improve thumb/finger stabilization to isolate finger movement and improve flexion/extension as well as add/abduction. Isolated finger movements completed over open plastic container including her cup holder form her WC on the table top on a non-slip surface for stability.  She is assisted stabilize fingers on the edge of the cup/box and engage in moving individual fingers up and down and side to side for isolated AROM in the center of the container. Patient also engaged in working on individual finger movements on table top with difficulty extending L middle finger. PATIENT EDUCATION: Education details: Finger ROM person educated: Patient and Caregiver Live in caregiver - [name] Education method: Explanation, Demonstration, and Verbal cues,  Education comprehension: verbalized understanding and needs further education   HOME EXERCISE PROGRAM: 11/25/2022: NMES use  12/02/22: All previous HEPs combined to 1 complete List through MedBridge Access Code: [email or web address] URL: [email or web address] Date: 12/02/2022 Prepared by: Amada Kingfisher   GOALS:   SHORT TERM GOALS: Target date: 11/09/22  Patient will be able to use AE/modified techniques to cut soft foods small enough for oral intake. Baseline: Caregiver/spouse assist Goal status: IN PROGRESS  2.  Patient will be able to use AE/modified positioning to complete word search by drawing lines through words with 0 errors. Baseline: TBD Goal status: NOT MET - Goal discontinued as patient focusing on writing more than word search ie) note taking etc.  3.   Patient will verbalize understanding of good pressure relief schedule (for 15 to 60 seconds every 15 to 60 minutes) to help with wound healing. Baseline: Patient did not change positioning in > 45 minutes of OT eval. Goal status: MET  4.  Patient will be assisted to explore modifications for leisure tasks (knit/sew/bake) with good return demonstration. Baseline: Minimal involvement Goal status: IN PROGRESS  11/17/22 - trialled knitting  12/02/22 & 12/20/22 - continued AE exploration  5.  Patient will demonstrate independence with HEP for UE strengthening, coordination and ROM to prevent contractures and maintain strength for transfers and ADLs. Baseline: Previous HEPs have been established but need to be reviewed and updated.  Goal status: IN PROGRESS  11/17/22 working on lists and categorizing activities 12/02/22 Comprehensive [employer] list compiled  6.  Patient will be assessed for typing speed/dexterity. Baseline: Patient reports difficulty with typing with all her fingers. Goal status: IN PROGRESS  7. Patient will complete Quick Dash UE assessment. Baseline: TBD Goal Status: 10/20/22 Discontinued - see scores above   LONG TERM GOALS: Target date: 01/07/23  Patient will complete 1 small craft/week r/t her leisure interests to work on FMS daily. Baseline: Minimal involvement Goal status: IN PROGRESS  2.  Patient will improve B coordination for increased typing speed/dexterity x 1-2 WPM. Baseline: TBD Goal status: IN PROGRESS  11/17/22 daily engagement with typing at home although modified use of digits for speed  3.  Patient will improve B UE coordination, strength and functional use to bake cookies with setup assistance and AE/modified techniques as appropriate.  Baseline: Not performed. Goal status: IN PROGRESS  4.  Patient will  report no more than moderate difficulty using a knife to foods such as chicken, small enough for oral intake using AE and strategies as needed. Baseline:  Caregiver/spouse assist Goal status: IN PROGRESS  5.  Patient will be able to use AE/modified positioning to complete 4 sentences with 100% legibility. Baseline: Subjective reports of difficulty with writing Goal status: IN PROGRESS   ASSESSMENT:  CLINICAL IMPRESSION:   Pt continues to seek feedback on incorporation of adaptive equipment and strategies to improve independence and safety with ADLs and IADLs. Pt benefiting from skilled OT services in the outpatient setting to work on UE impairments and provide progression of HEP ideas, explore AE and to help pt return to PLOF as able.     PERFORMANCE DEFICITS: in functional skills including ADLs, IADLs, coordination, dexterity, strength, muscle spasms, Fine motor control, Gross motor control, continence, skin integrity, and UE functional use,   IMPAIRMENTS: are limiting patient from ADLs, IADLs, work, and leisure.   CO-MORBIDITIES: has co-morbidities such as incontinence and wound  that affects occupational performance. Patient will benefit from skilled OT to address above impairments and improve overall function.  REHAB POTENTIAL: Fair due to chronicity of injury   PLAN:  OT FREQUENCY: 2x/week  OT DURATION: additional 4 weeks  PLANNED INTERVENTIONS: self care/ADL training, therapeutic exercise, therapeutic activity, neuromuscular re-education, manual therapy, passive range of motion, balance training, functional mobility training, splinting, patient/family education, energy conservation, coping strategies training, and DME and/or AE instructions  RECOMMENDED OTHER SERVICES: None at this time  CONSULTED AND AGREED WITH PLAN OF CARE: Patient and family member/caregiver  PLAN FOR NEXT SESSION:   Review and update all GOALS for progress note for continued therapy consideration with discussion re: specific goals and frequency.  Review positioning of R hand to help with finger opposition and isolation of finger joints.  Continue FM  tasks to improve progression of ADLs/IADLS hobby/craft activities (ie baking, yarn activities & baking).    Victorino Sparrow, OT 12/30/2022, 2:43 PM

## 2022-12-30 NOTE — Therapy (Signed)
OUTPATIENT PHYSICAL THERAPY NEURO TREATMENT   Patient Name: [name] MRN: [id number] DOB:28-Apr-1951, 71 y.o., female Today's Date: 12/30/2022   PCP: [name], MD REFERRING PROVIDER: [name], MD   END OF SESSION:  PT End of Session - 12/30/22 1152     Visit Number 22    Number of Visits 25   recert   Date for PT Re-Evaluation 01/26/23   to allow for scheduling delays   Authorization Type [employer] MEDICARE    Progress Note Due on Visit 30    PT Start Time 1150   from OT session   PT Stop Time 1230    PT Time Calculation (min) 40 min    Equipment Utilized During Treatment Gait belt    Activity Tolerance Patient tolerated treatment well    Behavior During Therapy [facility] for tasks assessed/performed                   Past Medical History:  Diagnosis Date   CERVICAL POLYP 03/11/2008   Qualifier: Diagnosis of  By: [name] MD, [name]    Colon polyps 2005   on colonscopy [name]   Fibroid 2004   Per Dr. [name]   History of shingles    face and mouth   Hx of skin cancer, basal cell    Rosacea    Sciatica of left side 09/28/2013   Scoliosis    noted on mri done for back pain   Past Surgical History:  Procedure Laterality Date   BUNIONECTOMY     Patient Active Problem List   Diagnosis Date Noted   Buttock wound, left, initial encounter 11/02/2022   Orthostatic hypotension 08/13/2022   Neurogenic bowel 05/03/2022   Spasticity 05/03/2022   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/03/2022   Nerve pain 05/03/2022   Medication monitoring encounter 01/08/2022   Neurogenic bladder 10/11/2021   Urinary incontinence 10/11/2021   ESBL (extended spectrum beta-lactamase) producing bacteria infection 10/09/2021   Recurrent UTI 10/09/2021   Quadriplegia, C5-C7 incomplete (HCC) 01/16/2021   History of spinal fracture 01/16/2021   Suprapubic catheter (HCC) 01/16/2021   Encounter for routine gynecological examination 09/28/2013   Onychomycosis 09/28/2013   Foot  deformity, acquired 03/26/2012   Encounter for preventive health examination 12/25/2010   ROSACEA 08/25/2009   Disturbance in sleep behavior 03/11/2008   SKIN CANCER, HX OF 03/11/2008   DYSURIA, HX OF 03/11/2008   Hyperlipidemia 02/10/2007   CERVICALGIA 02/10/2007    ONSET DATE: 09/28/2022 (most recent referral)  REFERRING DIAG: G82.54 (ICD-10-CM) - Quadriplegia, C5-C7, incomplete (HCC)  THERAPY DIAG:  Muscle weakness (generalized)  Abnormal posture  Quadriplegia, C5-C7 incomplete (HCC)  Rationale for Evaluation and Treatment: Rehabilitation  SUBJECTIVE:  SUBJECTIVE STATEMENT: Pt asking about PT plan going forwards. No acute changes since last visit.  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 Yes: NPRS scale: 3/10 Pain location: forearms to fingertips Pain description: constant, pinprick/tingling Aggravating factors: nighttime Relieving factors: nothing, sometimes medicines  PRECAUTIONS: Fall; suprapubic catheter (she wants to get this removed meaning she needs to get to and from the toilet); she has had minor heat sensation when needing to complete her bowel program-possible AD?  WEIGHT BEARING RESTRICTIONS: No-pt was in stander at most recent therapy in [place] last week.  She will have a bone density done soon w/ Dr. [name].  FALLS: Has patient fallen in last 6 months? No  LIVING ENVIRONMENT: Lives with: lives with their spouse and live-in nurse [name] Lives in: House/apartment-townhouse Stairs: No-level entry, but multi-level home 4 stories w/ elevator Has following equipment at home: Wheelchair (power), Wheelchair (manual), Grab bars, and standing  frame, sliding board, transport shower chair, handheld shower head, leg strap-pt reports she no longer finds this helpful  PLOF: Requires assistive device for independence, Needs assistance with ADLs, Needs assistance with homemaking, and Needs assistance with transfers  OCCUPATION:  Writer-uses Dragon to dictate  PATIENT GOALS: "Make my right leg work."  Stand and pivot so she can more easily access a toilet.  OBJECTIVE:   DIAGNOSTIC FINDINGS: No recent relevant imaging.  Bone density scheduled 10/15/2022.  COGNITION: Overall cognitive status: Within functional limits for tasks assessed   SENSATION: Light touch: Diminished ability to distinguish sharp and dull, but able to distinguish light touch from injury level down accurately  COORDINATION: Not formally assessed.  EDEMA:  Well managed w/ lymphatic massage and compression stockings.  MUSCLE TONE: Pt has intermittent clonus during transfers.  POSTURE: rounded shoulders, posterior pelvic tilt, and right thoracic scoliosis   LOWER EXTREMITY ROM:     Passive  Right 10/20/22 Left 10/20/22  Hip flexion [facility]  Hip extension    Hip abduction    Hip adduction    Hip internal rotation [facility] WFL  Hip external rotation [facility]  Knee flexion [facility] WFL  Knee extension [facility] WFL  Ankle dorsiflexion Slight PF contracture Slight PF contracture  Ankle plantarflexion    Ankle inversion    Ankle eversion     (Blank rows = not tested)    LOWER EXTREMITY MMT:    MMT Right 10/20/22 Left 10/20/22  Hip flexion 1 2+  Hip extension    Hip abduction    Hip adduction    Hip internal rotation    Hip external rotation    Knee flexion 2- 3  Knee extension 2- 3  Ankle dorsiflexion 0 3  Ankle plantarflexion    Ankle inversion    Ankle eversion     (Blank rows = not tested)   BED MOBILITY:  Sit to supine Mod A Supine to sit Mod A Rolling to Right Mod A Rolling to Left Mod A Undulating mattress for wound management on standard bed  (elevated-so often doing uphill sliding board transfers); she would like to continue working on sitting up independently, she has been working on rolling, needs less assistance w/ this when someone props her leg into hooklying; would like something to help her pull her left leg to her butt for stretching as well as bed mobility.  FUNCTIONAL TESTS:  None relevant to pt's  current functional level and abilities.  PATIENT SURVEYS:  None completed due to time.  TODAY'S TREATMENT:        Pt received seated in PWC, bump transfer PWC to mat table with min A needed due to difficulty with head/hips relationship this session. Sit to supine with mod A for BLE management. Supine to prone with mod A needed for some LE management and trunk control. Pt with difficulty maneuvering RUE out from under her body and up overhead to increase ease of transfer. Once in prone pt able to perform prone press-ups x 15 reps. Pt able to fully return to prone on mat table and then get into press-up position via lateral shifting of her trunk and scooting UE back under her one at a time. Pt with difficulty maneuvering her RUE under her due to fatigue/pain in that limb this date. Prone to supine with max A for trunk and LE management. Once in supine pt performed BLE AAROM hip flex/ext, knee flex/ext and hip abd/add in hook-lying position. Pt exhibits most strength in RLE hip add and in LLE quads, LLE strength > RLE. Pt returns to sitting EOM with max A needed for BLE management and trunk elevation. Slide board transfer back to [facility] with setup A. Pt left seated in PWC in care of [place] at end of session.   PATIENT EDUCATION: Education details: Insurance account manager at home, will discuss PT POC with team Person educated: Patient and Caregiver [name] Education method: Explanation Education comprehension: verbalized understanding  HOME EXERCISE PROGRAM: Will be established as needed as pt has done continuous therapy and is working towards  functional tasks.  GOALS: Goals reviewed with patient? Yes   NEW SHORT TERM GOALS=LONG TERM GOALS due to length of POC   NEW LONG TERM GOALS:  Target date: 01/12/2023  Pt will perform squat pivot transfer w/ no more than modA in order to improve access to home environment for toilet transfers. Baseline: minA for lateral scoot transfer, pt able to clear bottom intermittently; min A for "bump" transfer with assist just needed for LE management (7/2) Goal status: IN PROGRESS  2.  Pt will report wound healing in order to return to [facility] program at [facility]. Baseline:  Ischial wound, pt reports wound is healing but does have tunneling so she continues to utilize a wound vac and wound care services (7/2) Goal status: IN PROGRESS  3.  Pt will perform sit to stand transfer with no more than mod A with LRAD to demonstrate improved capacity for higher level transfers and access to uneven surfaces Baseline: max A to stand to stedy as of 7/2 Goal status: IN PROGRESS  4.  Pt will demonstrate ability to place slide board independently in preparation for slide board transfers Baseline: dependent for placement (7/2) Goal status: INITIAL  5.  Pt and caregiver will demonstrate independence with use of patient's personal NMES device for safe muscle activation and strengthening in her home environment Baseline: min cueing for safe setup (7/2) Goal status: INITIAL    ASSESSMENT:  CLINICAL IMPRESSION: Emphasis of skilled PT session on working on bed mobility rolling to/from prone and supine positions on mat table, stretching of hip flexor muscles in prone position, UB strengthening in prone position, and LB strengthening with AAROM in supine position. Pt continues to exhibit R hemibody weakness > L hemibody weakness. Pt continues to benefit from skilled therapy services to work towards increasing her safety and independence with functional mobility. Continue POC.   OBJECTIVE IMPAIRMENTS:  decreased balance, decreased coordination, decreased mobility, difficulty walking, decreased ROM, decreased strength, hypomobility, increased edema, impaired flexibility, impaired sensation, impaired tone, impaired UE functional use, improper body mechanics, postural dysfunction, and pain.   ACTIVITY LIMITATIONS: carrying, lifting, bending, sitting, standing, squatting, stairs, transfers, bed mobility, continence, bathing, toileting, dressing, reach over head, hygiene/grooming, locomotion level, and caring for others  PARTICIPATION LIMITATIONS: meal prep, cleaning, laundry, driving, and community activity  PERSONAL FACTORS: Age, Fitness, Past/current experiences, Time since onset of injury/illness/exacerbation, and 1-2 comorbidities: intermittent AD, neurogenic bowel/bladder on active bladder training  are also affecting patient's functional outcome.   REHAB POTENTIAL: Good  CLINICAL DECISION MAKING: Evolving/moderate complexity  EVALUATION COMPLEXITY: Moderate  PLAN:  PT FREQUENCY: 2x/week  PT DURATION: 8 weeks + 4 weeks (recert)  PLANNED INTERVENTIONS: Therapeutic exercises, Therapeutic activity, Neuromuscular re-education, Balance training, Patient/Family education, Self Care, DME instructions, Electrical stimulation, Wheelchair mobility training, Manual therapy, and Re-evaluation  PLAN FOR NEXT SESSION:  NMES to LE (making sure pt and [name] know how to set it up, where to place electrodes, etc), Core strength, static sitting balance-perturbations.  Least restrictive standing--can progress to stedy vs in // bars if skilled +2 available, use of powderboard on mat table for RLE strengthening, wants to work on increasing independence with lateral scoot transfers without SB and also wants to be able to place SB independently and move her LE more independently; use of leg loops/lifters to increase independence with LE management with transfers and bed mobility if pt is willing to use AE, standing  in // bars with knees blocked or knee immobilizers to assess standing without stedy, did she purchase [employer] patient turner?, work towards discussion of PT POC; placement strategies/problem solving independence with slideboard placement-enough grip and core stability for decreased assistance? bumping uphill, placing SB while seated in PWC, if +2 available could work on prone over peanut ball  Ask team about PT POC since pt may not start at [facility] right away, pt asking about scheduling for another 4 weeks (1-2x?)   Peter Congo, PT, DPT, CSRS   12/30/2022, 12:32 PM

## 2023-01-03 ENCOUNTER — Ambulatory Visit (HOSPITAL_BASED_OUTPATIENT_CLINIC_OR_DEPARTMENT_OTHER): Payer: Medicare PPO | Admitting: Internal Medicine

## 2023-01-03 ENCOUNTER — Ambulatory Visit: Payer: Medicare PPO | Admitting: Physical Therapy

## 2023-01-03 ENCOUNTER — Ambulatory Visit: Payer: Medicare PPO | Admitting: Occupational Therapy

## 2023-01-03 DIAGNOSIS — M24541 Contracture, right hand: Secondary | ICD-10-CM | POA: Diagnosis not present

## 2023-01-03 DIAGNOSIS — R293 Abnormal posture: Secondary | ICD-10-CM | POA: Diagnosis not present

## 2023-01-03 DIAGNOSIS — R278 Other lack of coordination: Secondary | ICD-10-CM

## 2023-01-03 DIAGNOSIS — M6281 Muscle weakness (generalized): Secondary | ICD-10-CM | POA: Diagnosis not present

## 2023-01-03 DIAGNOSIS — R29818 Other symptoms and signs involving the nervous system: Secondary | ICD-10-CM | POA: Diagnosis not present

## 2023-01-03 DIAGNOSIS — R29898 Other symptoms and signs involving the musculoskeletal system: Secondary | ICD-10-CM | POA: Diagnosis not present

## 2023-01-03 DIAGNOSIS — G8254 Quadriplegia, C5-C7 incomplete: Secondary | ICD-10-CM | POA: Diagnosis not present

## 2023-01-03 DIAGNOSIS — R2689 Other abnormalities of gait and mobility: Secondary | ICD-10-CM | POA: Diagnosis not present

## 2023-01-03 DIAGNOSIS — M24542 Contracture, left hand: Secondary | ICD-10-CM | POA: Diagnosis not present

## 2023-01-03 NOTE — Therapy (Unsigned)
OUTPATIENT OCCUPATIONAL THERAPY NEURO TREATMENT NOTE Patient Name: [name] MRN: [id number] DOB:[birth year], 72 y.o., female Today's Date: 01/03/2023  PCP: [name], MD  REFERRING PROVIDER: [name], MD  END OF SESSION:  OT End of Session - 01/03/23 1539     Visit Number 23    Number of Visits 25    Date for OT Re-Evaluation 01/07/23    Authorization Type [employer] - requires auth, [place] (additional auth requested)    Progress Note Due on Visit 26    OT Start Time 1533    OT Stop Time 1620    OT Time Calculation (min) 47 min    Equipment Utilized During Treatment power WC    Activity Tolerance Patient tolerated treatment well    Behavior During Therapy [facility] for tasks assessed/performed               Past Medical History:  Diagnosis Date   CERVICAL POLYP 03/11/2008   Qualifier: Diagnosis of  By: [name] MD, [name]    Colon polyps 2005   on colonscopy [name]   Fibroid 2004   Per Dr. [name]   History of shingles    face and mouth   Hx of skin cancer, basal cell    Rosacea    Sciatica of left side 09/28/2013   Scoliosis    noted on mri done for back pain   Past Surgical History:  Procedure Laterality Date   BUNIONECTOMY     Patient Active Problem List   Diagnosis Date Noted   Buttock wound, left, initial encounter 11/02/2022   Orthostatic hypotension 08/13/2022   Neurogenic bowel 05/03/2022   Spasticity 05/03/2022   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/03/2022   Nerve pain 05/03/2022   Medication monitoring encounter 01/08/2022   Neurogenic bladder 10/11/2021   Urinary incontinence 10/11/2021   ESBL (extended spectrum beta-lactamase) producing bacteria infection 10/09/2021   Recurrent UTI 10/09/2021   Quadriplegia, C5-C7 incomplete (HCC) 01/16/2021   History of spinal fracture 01/16/2021   Suprapubic catheter (HCC) 01/16/2021   Encounter for routine gynecological examination 09/28/2013   Onychomycosis 09/28/2013   Foot deformity, acquired  03/26/2012   Encounter for preventive health examination 12/25/2010   ROSACEA 08/25/2009   Disturbance in sleep behavior 03/11/2008   SKIN CANCER, HX OF 03/11/2008   DYSURIA, HX OF 03/11/2008   Hyperlipidemia 02/10/2007   CERVICALGIA 02/10/2007    ONSET DATE: 07/28/2020  Date of Referral 09/28/22  REFERRING DIAG: G82.54 (ICD-10-CM) - Quadriplegia, C5-C7, incomplete (HCC)  THERAPY DIAG:  Abnormal posture  Other lack of coordination  Muscle weakness (generalized)  Rationale for Evaluation and Treatment: Rehabilitation  SUBJECTIVE:   SUBJECTIVE STATEMENT:   Patient wants to continue to work on her core strength.  Pt accompanied by:  Live in Caregiver - [name]   PERTINENT HISTORY: "Pt is a 72 yr old L handed female with hx of incomplete quadriplegia- 2/14 2022- fleeing the police in [place] on passenger 100 (high speed) miles/hour,  [name]; neurogenic bowel and bladder and spasticity; [name], [name] Here for f/u on Incomplete quadriplegia"  Referral from MD 09/28/22 states, "Please eval and treat for ADLs and higher level mobility."  PRECAUTIONS: Fall; suprapubic catheter (she wants to get this removed meaning she needs to get to and from the toilet); she has had minor heat sensation when needing to complete her bowel program-possible AD?   WEIGHT BEARING RESTRICTIONS: No-pt was in stander at most recent therapy in [place] last  week.  She will have a bone density done soon w/ Dr. [name].   PAIN:  Patient reports chronic pain 3/10 fingers to elbow bilaterally managed by medication and will inform therapy staff of any changes.  FALLS: Has patient fallen in last 6 months? No  LIVING ENVIRONMENT: Lives with: lives with their family - husband [name] and with an adult companion s/p moving back up from [place] x10 months Lives in: House/apartment Stairs:  4 story town house with an Engineer, structural with threshold adjustments, roll in shower with transport chair Has  following equipment at home: Wheelchair (power) - with seat height adjustments to access counters and reclining option, Wheelchair (manual), transport WC, shower chair, and Ramped entry, handheld showerhead with rails around toilet, had Michiel Sites but is no longer in need of it, has slide boards x3  PLOF: Requires assistive device for independence, Needs assistance with ADLs, Needs assistance with homemaking, Needs assistance with gait, and Needs assistance with transfers; 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  Used to like to knit, sew and bake.  PATIENT GOALS: Wants to be able to type - currently using advanced Dragon dictation at times but prefers to type at times.  She would like to be able to write better and is interested in resuming some leisure activities such as Archivist and baking.  She also wants to keep working on being able to cut her own food and on her UE strength.   OBJECTIVE:   HAND DOMINANCE: Left  ADLs: Overall ADLs: Patient has a live in caregiver  Transfers/ambulation related to ADLs: Mod assist with sliding board transfers (previously [employer] lift).  Eating: Has a rocker knife that she can use. Used to use adapted utensils but now uses regular utensils but still will get assistance to cut food ie) when eating out.  Grooming: can brush her own hair but unable to manage jewelry ie) earrings  UB Dressing: can zip/unzip after it has been started, unable to manage buttons herself, Caregiver assists but if she has extra time, she can put on her bra, and a loose fitting pullover shirt/t-shirt  LB Dressing: dependent for LB dressing in bed and with special sock donner for LE compression garments   Toileting: bladder trained with suprapubic catheter which she clamps off.  Dependent for bowel incontinence care.  Bathing: Sponge bath with adult washclothes.  Can bath UB with back scrubber for most of her back.  Needs help with feet (mentioned she might need a separate  brush for feet)   Tub Shower transfers: Min-mod assist with slide board  Equipment: Shower seat with back, Walk in shower, bed side commode, Reacher, Sock aid, Long handled sponge, and Feeding equipment  IADLs: --  Shopping: Assisted by caregiver  Light housekeeping: Has housekeeper that comes monthly  Meal Prep: previously enjoyed baking. Assisted by caregiver but described recent success at reheating a meal for herself after getting food out of the fridge/freezer from her WC.  Community mobility: Dependent  Medication management: Caregiver sorts them into pillbox but she is very aware of her medications   Financial management: Patient manages her own finances  Handwriting: Increased time and has a pen with a little grip  MOBILITY STATUS: Independent with power mobiity  POSTURE COMMENTS:  No Significant postural limitations and forward head Sitting balance: Supports self independently with both Ues  ACTIVITY TOLERANCE: Activity tolerance: Fair - MMT WFL but has limited sustained tolerance for ongoing use of UEs  FUNCTIONAL OUTCOME MEASURES: 10/20/22  QuickDash 31.8 points   UPPER EXTREMITY ROM:   AROM - WFL without obvious contractures, some digital flexion noted but PROM WNL   AROM Right (eval) Left (eval)  Shoulder flexion [name]  Shoulder abduction [facility]  Elbow flexion [facility] WFL  Elbow extension [facility]  Wrist flexion [facility] WFL  Wrist extension WFL WFL  Ulnar deviation WFL Decreased ulnar  deviation past midline  Wrist pronation [facility] WFL  Wrist supination [facility]  Digit Composite Flexion Lacks full AROM:   1st digit - 5 cm   3rd digit -1 cm   4th digit - 2 cm  Lacks full AROM:   1st digit - 1.5 cm  5th digit - 3 cm   Digit Composite Extension [facility]  Digit Opposition Opposition to index finger only Lacks to pinkie due to limited DIP pinkie flexion  (Blank rows = not tested)  UPPER EXTREMITY MMT:   Grossly WFL - Endurance limited R tricep strength > than  L but L UE generally stronger than R UE  MMT Right (eval) Left (eval)  Shoulder flexion 4/5 4/5  Shoulder abduction 4/5 4/5  Elbow flexion 4/5 4/5  Elbow extension 4/5 4-/5  (Blank rows = not tested)  HAND FUNCTION: Grip strength: Right: 4.8 lbs; Left: 20.9 lbs 11/17/22 Right - 7.2 lbs  COORDINATION: Finger Nose Finger test: R generally WFL, L WNL Box and Blocks:  Right 28 blocks, Left 37 blocks R hand finger eventually cramps and dexterity worsens in the cold  SENSATION: Light touch: Impaired  - patient   EDEMA: NA for UEs but LE has poor lymph drainage with custom compression garments   MUSCLE TONE: Generally WFL but will assess further  COGNITION: Overall cognitive status: Within functional limits for tasks assessed  VISION: Subjective report: Patent wears progressive lens/glasses.  Denies diplopia or vision changes but has eye exam in the next couple of months. Baseline vision: Wears glasses all the time  VISION ASSESSMENT: WFL  EVALUATION OBSERVATIONS: Patient independent with power WC navigation within clinic.  Patient is well-kept with foley catheter in place.  She has slight limitations in full extension of digits but PROM is WNL and she has splints at home that she said she will bring for OT staff to assess.  She has functional ROM of B UEs to reach her head, behind her back and to cross midline to assist with ADLs.     TODAY'S TREATMENT:                                                                                                                            - Self Care  Patient engaged in activities to assist and improve ability to participate in hair drawing activities with no new hairdryer/brush combo.  Different size brushes for trials and different positions explored.  Patient is encouraged to use the recline in her wheelchair to sustained overhead reaching activities.  Patient engaged in practicing with device  in both left and right upper extremity.  She is  able to use the hairbrush to scoop her hair into waves while brushing it on the sides and at her bangs.  She is encouraged to work on increasing tolerance to overhead reaching for task in place of dowel rod lifts etc ie) replace some exercises with functional motions.   - Therapeutic Exercises UE finger/thumb ROM activities conducted with some hands on guidance from OTR to improve thumb/finger stabilization to isolate finger movement and improve flexion/extension as well as add/abduction. Isolated finger movements completed over open plastic container including her cup holder form her WC on the table top on a non-slip surface for stability.  She is assisted stabilize fingers on the edge of the cup/box and engage in moving individual fingers up and down and side to side for isolated AROM in the center of the container. Patient also engaged in working on individual finger movements on table top with difficulty extending L middle finger. PATIENT EDUCATION: Education details: Finger ROM person educated: Patient and Caregiver Live in caregiver - [name] Education method: Explanation, Demonstration, and Verbal cues,  Education comprehension: verbalized understanding and needs further education   HOME EXERCISE PROGRAM: 11/25/2022: NMES use  12/02/22: All previous HEPs combined to 1 complete List through MedBridge Access Code: [email or web address] URL: [email or web address] Date: 12/02/2022 Prepared by: Amada Kingfisher   GOALS:   SHORT TERM GOALS: Target date: 11/09/22  Patient will be able to use AE/modified techniques to cut soft foods small enough for oral intake. Baseline: Caregiver/spouse assist Goal status: MET  2.  Patient will be able to use AE/modified positioning to complete word search by drawing lines through words with 0 errors. Baseline: TBD Goal status: NOT MET - Goal discontinued as patient focusing on writing more than word search ie) note taking etc.    3.  Patient will verbalize  understanding of good pressure relief schedule (for 15 to 60 seconds every 15 to 60 minutes) to help with wound healing. Baseline: Patient did not change positioning in > 45 minutes of OT eval. Goal status: MET  4.  Patient will be assisted to explore modifications for leisure tasks (knit/sew/bake) with good return demonstration. Baseline: Minimal involvement Goal status: MET  11/17/22 - trialled knitting  12/02/22 & 12/20/22 - continued AE exploration 01/03/23 Baking is incomplete (see long term goal)  5.  Patient will demonstrate independence with HEP for UE strengthening, coordination and ROM to prevent contractures and maintain strength for transfers and ADLs. Baseline: Previous HEPs have been established but need to be reviewed and updated.  Goal status: MET  11/17/22 working on lists and categorizing activities 12/02/22 Comprehensive [employer] list compiled  6.  Patient will be assessed for typing speed/dexterity. Baseline: Patient reports difficulty with typing with all her fingers. Goal status: REVISED - see long term goals 01/03/23 - needs to work on typing assessment and keyboard evaluation (regular keyboard verus Surface keyboard) Maximum use of Dragon assessment (create a table ie) how to move divder to change ie) can't click and drag table columns without finger slipping) due to revising entire version of book) 300+ pages - needs 15% changes to be a new version   LONG TERM GOALS: Target date: 01/07/23  Patient will complete 1 small craft/week r/t her leisure interests to work on FMS daily. Baseline: Minimal involvement Goal status: IN PROGRESS  2.  Patient will improve B coordination for increased typing speed/dexterity x 1-2 WPM. Baseline: TBD Goal status: IN PROGRESS  11/17/22 daily  engagement with typing at home although modified use of digits for speed  3.  Patient will improve B UE coordination, strength and functional use to bake cookies with setup assistance and AE/modified  techniques as appropriate.  Baseline: Not performed. Goal status: IN PROGRESS  4.  Patient will report no more than moderate difficulty using a knife to foods such as chicken, small enough for oral intake using AE and strategies as needed. Baseline: Caregiver/spouse assist Goal status: IN PROGRESS 01/03/23 Wants to hold food better with fork with R hand to cut with L hand better so food does not slide across the table.  5.  Patient will be able to use AE/modified positioning to complete handwritten list 100% legibility. Baseline: Subjective reports of difficulty with writing Goal status: REVISED   ASSESSMENT:  CLINICAL IMPRESSION:   Pt continues to seek feedback on incorporation of adaptive equipment and strategies to improve independence and safety with ADLs and IADLs. Pt benefiting from skilled OT services in the outpatient setting to work on UE impairments and provide progression of HEP ideas, explore AE and to help pt return to PLOF as able.     PERFORMANCE DEFICITS: in functional skills including ADLs, IADLs, coordination, dexterity, strength, muscle spasms, Fine motor control, Gross motor control, continence, skin integrity, and UE functional use,   IMPAIRMENTS: are limiting patient from ADLs, IADLs, work, and leisure.   CO-MORBIDITIES: has co-morbidities such as incontinence and wound  that affects occupational performance. Patient will benefit from skilled OT to address above impairments and improve overall function.  REHAB POTENTIAL: Fair due to chronicity of injury   PLAN:  OT FREQUENCY: 2x/week  OT DURATION: additional 4 weeks  PLANNED INTERVENTIONS: self care/ADL training, therapeutic exercise, therapeutic activity, neuromuscular re-education, manual therapy, passive range of motion, balance training, functional mobility training, splinting, patient/family education, energy conservation, coping strategies training, and DME and/or AE instructions  RECOMMENDED OTHER  SERVICES: None at this time  CONSULTED AND AGREED WITH PLAN OF CARE: Patient and family member/caregiver  PLAN FOR NEXT SESSION:   Review and update all GOALS for progress note for continued therapy consideration with discussion re: specific goals and frequency.  Review positioning of R hand to help with finger opposition and isolation of finger joints.  Continue FM tasks to improve progression of ADLs/IADLS hobby/craft activities (ie baking, yarn activities & baking).    Victorino Sparrow, OT 01/03/2023, 4:30 PM

## 2023-01-03 NOTE — Therapy (Signed)
OUTPATIENT PHYSICAL THERAPY NEURO TREATMENT   Patient Name: [name] MRN: [id number] DOB:05/27/1951, 72 y.o., female Today's Date: 01/03/2023   PCP: [name], MD REFERRING PROVIDER: [name], MD   END OF SESSION:  PT End of Session - 01/03/23 1449     Visit Number 23    Number of Visits 25   recert   Date for PT Re-Evaluation 01/26/23   to allow for scheduling delays   Authorization Type [employer] MEDICARE    Progress Note Due on Visit 30    PT Start Time 1445    PT Stop Time 1530    PT Time Calculation (min) 45 min    Equipment Utilized During Treatment Gait belt    Activity Tolerance Patient tolerated treatment well    Behavior During Therapy [facility] for tasks assessed/performed                    Past Medical History:  Diagnosis Date   CERVICAL POLYP 03/11/2008   Qualifier: Diagnosis of  By: [name] MD, [name]    Colon polyps 2005   on colonscopy [name]   Fibroid 2004   Per Dr. [name]   History of shingles    face and mouth   Hx of skin cancer, basal cell    Rosacea    Sciatica of left side 09/28/2013   Scoliosis    noted on mri done for back pain   Past Surgical History:  Procedure Laterality Date   BUNIONECTOMY     Patient Active Problem List   Diagnosis Date Noted   Buttock wound, left, initial encounter 11/02/2022   Orthostatic hypotension 08/13/2022   Neurogenic bowel 05/03/2022   Spasticity 05/03/2022   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/03/2022   Nerve pain 05/03/2022   Medication monitoring encounter 01/08/2022   Neurogenic bladder 10/11/2021   Urinary incontinence 10/11/2021   ESBL (extended spectrum beta-lactamase) producing bacteria infection 10/09/2021   Recurrent UTI 10/09/2021   Quadriplegia, C5-C7 incomplete (HCC) 01/16/2021   History of spinal fracture 01/16/2021   Suprapubic catheter (HCC) 01/16/2021   Encounter for routine gynecological examination 09/28/2013   Onychomycosis 09/28/2013   Foot deformity, acquired  03/26/2012   Encounter for preventive health examination 12/25/2010   ROSACEA 08/25/2009   Disturbance in sleep behavior 03/11/2008   SKIN CANCER, HX OF 03/11/2008   DYSURIA, HX OF 03/11/2008   Hyperlipidemia 02/10/2007   CERVICALGIA 02/10/2007    ONSET DATE: 09/28/2022 (most recent referral)  REFERRING DIAG: G82.54 (ICD-10-CM) - Quadriplegia, C5-C7, incomplete (HCC)  THERAPY DIAG:  Muscle weakness (generalized)  Abnormal posture  Quadriplegia, C5-C7 incomplete (HCC)  Rationale for Evaluation and Treatment: Rehabilitation  SUBJECTIVE:  SUBJECTIVE STATEMENT: Denies any acute changes or pain.  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 Yes: NPRS scale: 3/10 Pain location: forearms to fingertips Pain description: constant, pinprick/tingling Aggravating factors: nighttime Relieving factors: nothing, sometimes medicines  PRECAUTIONS: Fall; suprapubic catheter (she wants to get this removed meaning she needs to get to and from the toilet); she has had minor heat sensation when needing to complete her bowel program-possible AD?  WEIGHT BEARING RESTRICTIONS: No-pt was in stander at most recent therapy in [place] last week.  She will have a bone density done soon w/ Dr. [name].  FALLS: Has patient fallen in last 6 months? No  LIVING ENVIRONMENT: Lives with: lives with their spouse and live-in nurse [name] Lives in: House/apartment-townhouse Stairs: No-level entry, but multi-level home 4 stories w/ elevator Has following equipment at home: Wheelchair (power), Wheelchair (manual), Grab bars, and standing frame, sliding board, transport shower chair, handheld shower  head, leg strap-pt reports she no longer finds this helpful  PLOF: Requires assistive device for independence, Needs assistance with ADLs, Needs assistance with homemaking, and Needs assistance with transfers  OCCUPATION:  Writer-uses Dragon to dictate  PATIENT GOALS: "Make my right leg work."  Stand and pivot so she can more easily access a toilet.  OBJECTIVE:   DIAGNOSTIC FINDINGS: No recent relevant imaging.  Bone density scheduled 10/15/2022.  COGNITION: Overall cognitive status: Within functional limits for tasks assessed   SENSATION: Light touch: Diminished ability to distinguish sharp and dull, but able to distinguish light touch from injury level down accurately  COORDINATION: Not formally assessed.  EDEMA:  Well managed w/ lymphatic massage and compression stockings.  MUSCLE TONE: Pt has intermittent clonus during transfers.  POSTURE: rounded shoulders, posterior pelvic tilt, and right thoracic scoliosis   LOWER EXTREMITY ROM:     Passive  Right 10/20/22 Left 10/20/22  Hip flexion [facility]  Hip extension    Hip abduction    Hip adduction    Hip internal rotation [facility] WFL  Hip external rotation [facility]  Knee flexion [facility] WFL  Knee extension [facility] WFL  Ankle dorsiflexion Slight PF contracture Slight PF contracture  Ankle plantarflexion    Ankle inversion    Ankle eversion     (Blank rows = not tested)    LOWER EXTREMITY MMT:    MMT Right 10/20/22 Left 10/20/22  Hip flexion 1 2+  Hip extension    Hip abduction    Hip adduction    Hip internal rotation    Hip external rotation    Knee flexion 2- 3  Knee extension 2- 3  Ankle dorsiflexion 0 3  Ankle plantarflexion    Ankle inversion    Ankle eversion     (Blank rows = not tested)   BED MOBILITY:  Sit to supine Mod A Supine to sit Mod A Rolling to Right Mod A Rolling to Left Mod A Undulating mattress for wound management on standard bed (elevated-so often doing uphill sliding board transfers); she would  like to continue working on sitting up independently, she has been working on rolling, needs less assistance w/ this when someone props her leg into hooklying; would like something to help her pull her left leg to her butt for stretching as well as bed mobility.  FUNCTIONAL TESTS:  None relevant to pt's current functional level and abilities.  PATIENT  SURVEYS:  None completed due to time.  TODAY'S TREATMENT:        Pt received seated in PWC, performs slide board transfer to mat table with assist needed for management of BLE, places short board independently. Sit to stand on stedy with min A from elevated mat table, CGA from perched position on stedy. Pt tolerates standing in stedy x 1 min, 2 x 20 sec, x 1 min, x 30 sec with CGA to min A needed. Pt exhibits activation of quads and glutes in standing (L/R) though they do fatigue quickly. Pt also reports heavy UE reliance on stedy handle in standing.  Scooting L/R along front edge of mat table from slightly elevated mat with focus on increasing pt independence with management of BLE. Pt is able to scoot x 5 ft to the R and 2 x 5 ft to the L independently and maneuver her BLE using hooking with her UE. Pt with ongoing clonus in BLE (R>L). Pt even able to partially maneuver RLE back onto footrest of PWC to prepare for transfer back to chair, able to maneuver LLE independently. Slide board transfer back to [facility] with min A needed for RLE management at times. Pt left seated in PWC handed off to OT.   PATIENT EDUCATION: Education details: Insurance account manager at home, PT POC Person educated: Patient and Arts administrator Education method: Explanation Education comprehension: verbalized understanding  HOME EXERCISE PROGRAM: Will be established as needed as pt has done continuous therapy and is working towards functional tasks.  GOALS: Goals reviewed with patient? Yes   NEW SHORT TERM GOALS=LONG TERM GOALS due to length of POC   NEW LONG TERM GOALS:   Target date: 01/12/2023  Pt will perform squat pivot transfer w/ no more than modA in order to improve access to home environment for toilet transfers. Baseline: minA for lateral scoot transfer, pt able to clear bottom intermittently; min A for "bump" transfer with assist just needed for LE management (7/2) Goal status: IN PROGRESS  2.  Pt will report wound healing in order to return to day program at [facility]. Baseline:  Ischial wound, pt reports wound is healing but does have tunneling so she continues to utilize a wound vac and wound care services (7/2) Goal status: IN PROGRESS  3.  Pt will perform sit to stand transfer with no more than mod A with LRAD to demonstrate improved capacity for higher level transfers and access to uneven surfaces Baseline: max A to stand to stedy as of 7/2 Goal status: IN PROGRESS  4.  Pt will demonstrate ability to place slide board independently in preparation for slide board transfers Baseline: dependent for placement (7/2) Goal status: INITIAL  5.  Pt and caregiver will demonstrate independence with use of patient's personal NMES device for safe muscle activation and strengthening in her home environment Baseline: min cueing for safe setup (7/2) Goal status: INITIAL    ASSESSMENT:  CLINICAL IMPRESSION: Emphasis of skilled PT session on continuing to work on BLE NMR via use of standing in stedy as well as increasing independence with management of BLE during transfers and mobility. Pt has progressed to only needing min A to stand to stedy and can tolerate standing up to 1 min with BUE support and CGA. Pt also exhibits increased independence with management of BLE during transfers this date by hooking them with her UE. Pt continues to benefit from skilled therapy services to continue to work towards increasing her safety and independence with  functional mobility. Continue POC.   OBJECTIVE IMPAIRMENTS: decreased balance, decreased coordination,  decreased mobility, difficulty walking, decreased ROM, decreased strength, hypomobility, increased edema, impaired flexibility, impaired sensation, impaired tone, impaired UE functional use, improper body mechanics, postural dysfunction, and pain.   ACTIVITY LIMITATIONS: carrying, lifting, bending, sitting, standing, squatting, stairs, transfers, bed mobility, continence, bathing, toileting, dressing, reach over head, hygiene/grooming, locomotion level, and caring for others  PARTICIPATION LIMITATIONS: meal prep, cleaning, laundry, driving, and community activity  PERSONAL FACTORS: Age, Fitness, Past/current experiences, Time since onset of injury/illness/exacerbation, and 1-2 comorbidities: intermittent AD, neurogenic bowel/bladder on active bladder training  are also affecting patient's functional outcome.   REHAB POTENTIAL: Good  CLINICAL DECISION MAKING: Evolving/moderate complexity  EVALUATION COMPLEXITY: Moderate  PLAN:  PT FREQUENCY: 2x/week  PT DURATION: 8 weeks + 4 weeks (recert)  PLANNED INTERVENTIONS: Therapeutic exercises, Therapeutic activity, Neuromuscular re-education, Balance training, Patient/Family education, Self Care, DME instructions, Electrical stimulation, Wheelchair mobility training, Manual therapy, and Re-evaluation  PLAN FOR NEXT SESSION:  NMES to LE (making sure pt and [name] know how to set it up, where to place electrodes, etc), Core strength, static sitting balance-perturbations.  Least restrictive standing--can progress to stedy vs in // bars if skilled +2 available, use of powderboard on mat table for RLE strengthening, wants to work on increasing independence with lateral scoot transfers without SB and also wants to be able to place SB independently and move her LE more independently; use of leg loops/lifters to increase independence with LE management with transfers and bed mobility if pt is willing to use AE, standing in // bars with knees blocked or knee  immobilizers to assess standing without stedy, did she purchase Vive patient turner?, work towards discussion of PT POC; placement strategies/problem solving independence with slideboard placement-enough grip and core stability for decreased assistance? bumping uphill, placing SB while seated in PWC, if +2 available could work on prone over peanut ball  Recert for 2x/week for 4 weeks, did she schedule and do we have +2?   Peter Congo, PT, DPT, CSRS   01/03/2023, 3:32 PM

## 2023-01-06 ENCOUNTER — Ambulatory Visit: Payer: Medicare PPO | Admitting: Occupational Therapy

## 2023-01-06 ENCOUNTER — Ambulatory Visit: Payer: Medicare PPO | Admitting: Physical Therapy

## 2023-01-06 ENCOUNTER — Encounter (HOSPITAL_BASED_OUTPATIENT_CLINIC_OR_DEPARTMENT_OTHER): Payer: Medicare PPO | Admitting: Internal Medicine

## 2023-01-06 DIAGNOSIS — M24541 Contracture, right hand: Secondary | ICD-10-CM

## 2023-01-06 DIAGNOSIS — L89323 Pressure ulcer of left buttock, stage 3: Secondary | ICD-10-CM

## 2023-01-06 DIAGNOSIS — T798XXA Other early complications of trauma, initial encounter: Secondary | ICD-10-CM

## 2023-01-06 DIAGNOSIS — G8250 Quadriplegia, unspecified: Secondary | ICD-10-CM | POA: Diagnosis not present

## 2023-01-06 DIAGNOSIS — R29818 Other symptoms and signs involving the nervous system: Secondary | ICD-10-CM | POA: Diagnosis not present

## 2023-01-06 DIAGNOSIS — R293 Abnormal posture: Secondary | ICD-10-CM

## 2023-01-06 DIAGNOSIS — M6281 Muscle weakness (generalized): Secondary | ICD-10-CM

## 2023-01-06 DIAGNOSIS — G8254 Quadriplegia, C5-C7 incomplete: Secondary | ICD-10-CM

## 2023-01-06 DIAGNOSIS — R278 Other lack of coordination: Secondary | ICD-10-CM | POA: Diagnosis not present

## 2023-01-06 DIAGNOSIS — M24542 Contracture, left hand: Secondary | ICD-10-CM | POA: Diagnosis not present

## 2023-01-06 DIAGNOSIS — R2689 Other abnormalities of gait and mobility: Secondary | ICD-10-CM

## 2023-01-06 DIAGNOSIS — R29898 Other symptoms and signs involving the musculoskeletal system: Secondary | ICD-10-CM | POA: Diagnosis not present
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
## 2023-01-06 NOTE — Progress Notes (Signed)
[name] ([id number]) [id number] Page 1 of 7 Visit Report for 01/06/2023 Arrival Information Details Patient Name: Date of Service: [name]. 01/06/2023 8:45 A M Medical Record Number: [id number] Patient Account Number: [id number] Date of Birth/Sex: Treating RN: 08/03/1950 (72 y.o. [name]) Primary Care [name]: Berniece Andreas Other Clinician: Referring [name]: Treating [name]/Extender: Skeet Latch in Treatment: 12 Visit Information History Since Last Visit Added or deleted any medications: No Patient Arrived: Wheel Chair Any new allergies or adverse reactions: No Arrival Time: 09:14 Had a fall or experienced change in No Accompanied By: caregiver activities of daily living that may affect Transfer Assistance: Transfer Board risk of falls: Patient Identification Verified: Yes Signs or symptoms of abuse/neglect since last visito No Secondary Verification Process Completed: Yes Hospitalized since last visit: No Patient Requires Transmission-Based Precautions: No Implantable device outside of the clinic excluding No Patient Has Alerts: No cellular tissue based products placed in the center since last visit: Has Dressing in Place as Prescribed: Yes Pain Present Now: No Electronic Signature(s) Signed: 01/06/2023 4:36:58 PM By: Thayer Dallas Entered By: Thayer Dallas on 01/06/2023 09:16:41 -------------------------------------------------------------------------------- Clinic Level of Care Assessment Details Patient Name: Date of Service: Carmen NDO N, MA [name]. 01/06/2023 8:45 A M Medical Record Number: [id number] Patient Account Number: [id number] Date of Birth/Sex: Treating RN: 22-Oct-1950 (72 y.o. [name] Primary Care [name]: Berniece Andreas Other Clinician: Referring [name]: Treating [name]/Extender: Skeet Latch in Treatment: 12 Clinic Level of Care Assessment  Items TOOL 4 Quantity Score X- 1 0 Use when only an EandM is performed on FOLLOW-UP visit ASSESSMENTS - Nursing Assessment / Reassessment X- 1 10 Reassessment of Co-morbidities (includes updates in patient status) X- 1 5 Reassessment of Adherence to Treatment Plan ASSESSMENTS - Wound and Skin A ssessment / Reassessment X - Simple Wound Assessment / Reassessment - one wound 1 5 []  - 0 Complex Wound Assessment / Reassessment - multiple wounds []  - 0 Dermatologic / Skin Assessment (not related to wound area) ASSESSMENTS - Focused Assessment []  - 0 Circumferential Edema Measurements - multi extremities []  - 0 Nutritional Assessment / Counseling / Intervention [name], [name] ([id number]) [id number] Page 2 of 7 []  - 0 Lower Extremity Assessment (monofilament, tuning fork, pulses) []  - 0 Peripheral Arterial Disease Assessment (using hand held doppler) ASSESSMENTS - Ostomy and/or Continence Assessment and Care []  - 0 Incontinence Assessment and Management []  - 0 Ostomy Care Assessment and Management (repouching, etc.) PROCESS - Coordination of Care []  - 0 Simple Patient / Family Education for ongoing care []  - 0 Complex (extensive) Patient / Family Education for ongoing care X- 1 10 Staff obtains Chiropractor, Records, T Results / Process Orders est []  - 0 Staff telephones HHA, Nursing Homes / Clarify orders / etc []  - 0 Routine Transfer to another Facility (non-emergent condition) []  - 0 Routine Hospital Admission (non-emergent condition) []  - 0 New Admissions / Manufacturing engineer / Ordering NPWT Apligraf, etc. , []  - 0 Emergency Hospital Admission (emergent condition) X- 1 10 Simple Discharge Coordination []  - 0 Complex (extensive) Discharge Coordination PROCESS - Special Needs []  - 0 Pediatric / Minor Patient Management []  - 0 Isolation Patient Management []  - 0 Hearing / Language / Visual special needs []  - 0 Assessment of  Community assistance (transportation, D/C planning, etc.) []  - 0 Additional assistance / Altered mentation []  - 0 Support Surface(s) Assessment (bed, cushion, seat, etc.) INTERVENTIONS - Wound Cleansing / Measurement X -  Simple Wound Cleansing - one wound 1 5 []  - 0 Complex Wound Cleansing - multiple wounds X- 1 5 Wound Imaging (photographs - any number of wounds) []  - 0 Wound Tracing (instead of photographs) X- 1 5 Simple Wound Measurement - one wound []  - 0 Complex Wound Measurement - multiple wounds INTERVENTIONS - Wound Dressings X - Small Wound Dressing one or multiple wounds 1 10 []  - 0 Medium Wound Dressing one or multiple wounds []  - 0 Large Wound Dressing one or multiple wounds []  - 0 Application of Medications - topical []  - 0 Application of Medications - injection INTERVENTIONS - Miscellaneous []  - 0 External ear exam []  - 0 Specimen Collection (cultures, biopsies, blood, body fluids, etc.) []  - 0 Specimen(s) / Culture(s) sent or taken to Lab for analysis []  - 0 Patient Transfer (multiple staff / Nurse, adult / Similar devices) []  - 0 Simple Staple / Suture removal (25 or less) []  - 0 Complex Staple / Suture removal (26 or more) []  - 0 Hypo / Hyperglycemic Management (close monitor of Blood Glucose) [name] ([id number]) [id number] Page 3 of 7 []  - 0 Ankle / Brachial Index (ABI) - do not check if billed separately X- 1 5 Vital Signs Has the patient been seen at the hospital within the last three years: Yes Total Score: 70 Level Of Care: New/Established - Level 2 Electronic Signature(s) Signed: 01/06/2023 4:00:15 PM By: Redmond Pulling RN, BSN Entered By: Redmond Pulling on 01/06/2023 13:41:16 -------------------------------------------------------------------------------- Encounter Discharge Information Details Patient Name: Date of Service: Carmen NDO N, MA [name]. 01/06/2023 8:45 A M Medical Record Number:  [id number] Patient Account Number: [id number] Date of Birth/Sex: Treating RN: January 23, 1951 (72 y.o. [name] Primary Care Jerilee Space: Berniece Andreas Other Clinician: Referring Trent Theisen: Treating Brienna Bass/Extender: Skeet Latch in Treatment: 12 Encounter Discharge Information Items Discharge Condition: Stable Ambulatory Status: Wheelchair Discharge Destination: Home Transportation: Private Auto Accompanied By: caregiver Schedule Follow-up Appointment: Yes Clinical Summary of Care: Patient Declined Electronic Signature(s) Signed: 01/06/2023 4:00:15 PM By: Redmond Pulling RN, BSN Entered By: Redmond Pulling on 01/06/2023 11:18:17 -------------------------------------------------------------------------------- Lower Extremity Assessment Details Patient Name: Date of Service: Carmen NDO N, MA [name]. 01/06/2023 8:45 A M Medical Record Number: [id number] Patient Account Number: [id number] Date of Birth/Sex: Treating RN: 11-19-1950 (72 y.o. [name]) Primary Care Yarithza Mink: Berniece Andreas Other Clinician: Referring Oma Alpert: Treating Sofhia Ulibarri/Extender: Skeet Latch in Treatment: 12 Electronic Signature(s) Signed: 01/06/2023 4:36:58 PM By: Thayer Dallas Entered By: Thayer Dallas on 01/06/2023 09:18:27 -------------------------------------------------------------------------------- Multi Wound Chart Details Patient Name: Date of Service: Carmen NDO N, MA [name]. 01/06/2023 8:45 A M Medical Record Number: [id number] Patient Account Number: [id number] [name], [name] ([id number]) [phone].pdf Page 4 of 7 Date of Birth/Sex: Treating RN: [birth year] (72 y.o. [name]) Primary Care Devron Cohick: Berniece Andreas Other Clinician: Referring Yesika Rispoli: Treating Garreth Burnsworth/Extender: Skeet Latch in Treatment: 12 Vital Signs Height(in): 64 Pulse(bpm): 76 Weight(lbs): 115 Blood Pressure(mmHg): 87/56 Body Mass Index(BMI):  19.7 Temperature([name]): 97.9 Respiratory Rate(breaths/min): 18 [1:Photos:] [N/A:N/A] Left Gluteus N/A N/A Wound Location: Pressure Injury N/A N/A Wounding Event: Pressure Ulcer N/A N/A Primary Etiology: Cataracts, Lymphedema, N/A N/A Comorbid History: Hypotension, Quadriplegia 07/15/2022 N/A N/A Date Acquired: 12 N/A N/A Weeks of Treatment: Healed - Epithelialized N/A N/A Wound Status: No N/A N/A Wound Recurrence: 0x0x0 N/A N/A Measurements L x W x D (cm) 0 N/A N/A A (cm) : rea 0 N/A N/A Volume (cm) : 100.00% N/A N/A % Reduction in  A rea: 100.00% N/A N/A % Reduction in Volume: Category/Stage III N/A N/A Classification: None Present N/A N/A Exudate A mount: Distinct, outline attached N/A N/A Wound Margin: None Present (0%) N/A N/A Granulation A mount: None Present (0%) N/A N/A Necrotic A mount: Fascia: No N/A N/A Exposed Structures: Fat Layer (Subcutaneous Tissue): No Tendon: No Muscle: No Joint: No Bone: No Large (67-100%) N/A N/A Epithelialization: Excoriation: No N/A N/A Periwound Skin Texture: Induration: No Callus: No Crepitus: No Rash: No Scarring: No Maceration: No N/A N/A Periwound Skin Moisture: Dry/Scaly: No Atrophie Blanche: No N/A N/A Periwound Skin Color: Cyanosis: No Ecchymosis: No Erythema: No Hemosiderin Staining: No Mottled: No Pallor: No Rubor: No Treatment Notes Electronic Signature(s) Signed: 01/06/2023 12:37:31 PM By: Geralyn Corwin DO Entered By: Geralyn Corwin on 01/06/2023 09:51:32 [name] ([id number]) [id number] Page 5 of 7 -------------------------------------------------------------------------------- Multi-Disciplinary Care Plan Details Patient Name: Date of Service: Carmen NDO N, [place] [name]. 01/06/2023 8:45 A M Medical Record Number: [id number] Patient Account Number: [id number] Date of Birth/Sex: Treating RN: 1950-10-05 (72 y.o. [name] Primary Care Kaiyah Eber: Berniece Andreas Other Clinician: Referring Steen Bisig: Treating Elley Harp/Extender: Skeet Latch in Treatment: 12 Active Inactive Electronic Signature(s) Signed: 01/06/2023 4:00:15 PM By: Redmond Pulling RN, BSN Entered By: Redmond Pulling on 01/06/2023 11:18:40 -------------------------------------------------------------------------------- Pain Assessment Details Patient Name: Date of Service: Carmen NDO N, MA [name]. 01/06/2023 8:45 A M Medical Record Number: [id number] Patient Account Number: [id number] Date of Birth/Sex: Treating RN: 10/04/1950 (72 y.o. [name]) Primary Care Talisa Petrak: Berniece Andreas Other Clinician: Referring Margaret Staggs: Treating Aerion Bagdasarian/Extender: Skeet Latch in Treatment: 12 Active Problems Location of Pain Severity and Description of Pain Patient Has Paino No Site Locations Pain Management and Medication Current Pain Management: Electronic Signature(s) Signed: 01/06/2023 4:36:58 PM By: Thayer Dallas Entered By: Thayer Dallas on 01/06/2023 09:18:02 [name] ([id number]) [id number] Page 6 of 7 -------------------------------------------------------------------------------- Patient/Caregiver Education Details Patient Name: Date of Service: Carmen NDO [place], [place] Orlinda Blalock 7/25/2024andnbsp8:45 A M Medical Record Number: [id number] Patient Account Number: [id number] Date of Birth/Gender: Treating RN: 01/25/1951 (72 y.o. [name] Primary Care Physician: Berniece Andreas Other Clinician: Referring Physician: Treating Physician/Extender: Skeet Latch in Treatment: 12 Education Assessment Education Provided To: Patient Education Topics Provided Wound/Skin Impairment: Methods: Explain/Verbal Responses: State content correctly [name]) Signed: 01/06/2023 4:00:15 PM By: Redmond Pulling RN, BSN Entered By: Redmond Pulling on 01/06/2023  09:28:15 -------------------------------------------------------------------------------- Wound Assessment Details Patient Name: Date of Service: Carmen NDO N, MA [name]. 01/06/2023 8:45 A M Medical Record Number: [id number] Patient Account Number: [id number] Date of Birth/Sex: Treating RN: 07-23-1950 (72 y.o. [name]) Primary Care Haniel Fix: Berniece Andreas Other Clinician: Referring Zyen Triggs: Treating Vergil Burby/Extender: Skeet Latch in Treatment: 12 Wound Status Wound Number: 1 Primary Etiology: Pressure Ulcer Wound Location: Left Gluteus Wound Status: Healed - Epithelialized Wounding Event: Pressure Injury Comorbid History: Cataracts, Lymphedema, Hypotension, Quadriplegia Date Acquired: 07/15/2022 Weeks Of Treatment: 12 Clustered Wound: No Photos Wound Measurements Length: (cm) Width: (cm) [name], [name] ([id number]) Depth: (cm) Area: (cm) Volume: (cm) 0 % Reduction in Area: 100% 0 % Reduction in Volume: 100% [phone].pdf Page 7 of 7 0 Epithelialization: Large (67-100%) 0 Tunneling: No 0 Undermining: No Wound Description Classification: Category/Stage III Wound Margin: Distinct, outline attached Exudate Amount: None Present Foul Odor After Cleansing: No Slough/Fibrino No Wound Bed Granulation Amount: None Present (0%) Exposed Structure Necrotic Amount: None Present (0%) Fascia Exposed: No Fat Layer (Subcutaneous Tissue) Exposed: No  Tendon Exposed: No Muscle Exposed: No Joint Exposed: No Bone Exposed: No Periwound Skin Texture Texture Color No Abnormalities Noted: No No Abnormalities Noted: No Callus: No Atrophie Blanche: No Crepitus: No Cyanosis: No Excoriation: No Ecchymosis: No Induration: No Erythema: No Rash: No Hemosiderin Staining: No Scarring: No Mottled: No Pallor: No Moisture Rubor: No No Abnormalities Noted: No Dry / Scaly: No Maceration: No Electronic Signature(s) Signed: 01/06/2023 4:00:15 PM  By: Redmond Pulling RN, BSN Entered By: Redmond Pulling on 01/06/2023 09:35:39 -------------------------------------------------------------------------------- Vitals Details Patient Name: Date of Service: Carmen NDO N, MA [name]. 01/06/2023 8:45 A M Medical Record Number: [id number] Patient Account Number: [id number] Date of Birth/Sex: Treating RN: [birth year] (72 y.o. [name]) Primary Care Fredrica Capano: Berniece Andreas Other Clinician: Referring Rashae Rother: Treating Hally Colella/Extender: Skeet Latch in Treatment: 12 Vital Signs Time Taken: 09:16 Temperature ([name]): 97.9 Height (in): 64 Pulse (bpm): 76 Weight (lbs): 115 Respiratory Rate (breaths/min): 18 Body Mass Index (BMI): 19.7 Blood Pressure (mmHg): 87/56 Reference Range: 80 - 120 mg / dl Electronic Signature(s) Signed: 01/06/2023 4:36:58 PM By: Thayer Dallas Entered By: Thayer Dallas on 01/06/2023 09:17:43

## 2023-01-06 NOTE — Therapy (Signed)
OUTPATIENT OCCUPATIONAL THERAPY NEURO TREATMENT Patient Name: [name] MRN: [id number] DOB:April 23, 1951, 72 y.o., female Today's Date: 01/06/2023  PCP: [name], MD  REFERRING PROVIDER: [name], MD  END OF SESSION:  OT End of Session - 01/06/23 1107     Visit Number 24    Number of Visits 36    Date for OT Re-Evaluation 02/18/23    Authorization Type [employer] - requires auth, [place] (additional auth requested)    Progress Note Due on Visit 36    OT Start Time 1105    OT Stop Time 1145    OT Time Calculation (min) 40 min    Equipment Utilized During Treatment power WC, computer    Activity Tolerance Patient tolerated treatment well    Behavior During Therapy [facility] for tasks assessed/performed               Past Medical History:  Diagnosis Date   CERVICAL POLYP 03/11/2008   Qualifier: Diagnosis of  By: [name] MD, [name]    Colon polyps 2005   on colonscopy [name]   Fibroid 2004   Per Dr. [name]   History of shingles    face and mouth   Hx of skin cancer, basal cell    Rosacea    Sciatica of left side 09/28/2013   Scoliosis    noted on mri done for back pain   Past Surgical History:  Procedure Laterality Date   BUNIONECTOMY     Patient Active Problem List   Diagnosis Date Noted   Buttock wound, left, initial encounter 11/02/2022   Orthostatic hypotension 08/13/2022   Neurogenic bowel 05/03/2022   Spasticity 05/03/2022   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/03/2022   Nerve pain 05/03/2022   Medication monitoring encounter 01/08/2022   Neurogenic bladder 10/11/2021   Urinary incontinence 10/11/2021   ESBL (extended spectrum beta-lactamase) producing bacteria infection 10/09/2021   Recurrent UTI 10/09/2021   Quadriplegia, C5-C7 incomplete (HCC) 01/16/2021   History of spinal fracture 01/16/2021   Suprapubic catheter (HCC) 01/16/2021   Encounter for routine gynecological examination 09/28/2013   Onychomycosis 09/28/2013   Foot deformity,  acquired 03/26/2012   Encounter for preventive health examination 12/25/2010   ROSACEA 08/25/2009   Disturbance in sleep behavior 03/11/2008   SKIN CANCER, HX OF 03/11/2008   DYSURIA, HX OF 03/11/2008   Hyperlipidemia 02/10/2007   CERVICALGIA 02/10/2007    ONSET DATE: 07/28/2020  Date of Referral 09/28/22  REFERRING DIAG: G82.54 (ICD-10-CM) - Quadriplegia, C5-C7, incomplete (HCC)  THERAPY DIAG:  Abnormal posture  Other lack of coordination  Muscle weakness (generalized)  Contracture of hand joint, left  Contracture of hand joint, right  Rationale for Evaluation and Treatment: Rehabilitation  SUBJECTIVE:   SUBJECTIVE STATEMENT:   Patient asked for help again with her joystick positioning in order to stabilize the joystick when moved to the side.  Patient and caregiver report that she had her last wound care appointment this morning and she is able to shower again and will be reaching out to [facility] again.   Pt accompanied by:  Live in Caregiver - [name]   PERTINENT HISTORY: "Pt is a 72 yr old L handed female with hx of incomplete quadriplegia- 2/14 2022- fleeing the police in [place] on passenger 100 (high speed) miles/hour,  [name]; neurogenic bowel and bladder and spasticity; [name], [name] Here for f/u on Incomplete quadriplegia"  Referral from MD 09/28/22 states, "Please eval and treat for  ADLs and higher level mobility."  PRECAUTIONS: Fall; suprapubic catheter (she wants to get this removed meaning she needs to get to and from the toilet); she has had minor heat sensation when needing to complete her bowel program-possible AD?   WEIGHT BEARING RESTRICTIONS: No-pt was in stander at most recent therapy in [place] last week.  She will have a bone density done soon w/ Dr. [name].   PAIN:  Patient reports chronic pain 3/10 fingers to elbow bilaterally managed by medication and will inform therapy staff of any changes.  FALLS:  Has patient fallen in last 6 months? No  LIVING ENVIRONMENT: Lives with: lives with their family - husband [name] and with an adult companion s/p moving back up from [place] x10 months Lives in: House/apartment Stairs:  4 story town house with an Engineer, structural with threshold adjustments, roll in shower with transport chair Has following equipment at home: Wheelchair (power) - with seat height adjustments to access counters and reclining option, Wheelchair (manual), transport WC, shower chair, and Ramped entry, handheld showerhead with rails around toilet, had Michiel Sites but is no longer in need of it, has slide boards x3  PLOF: Requires assistive device for independence, Needs assistance with ADLs, Needs assistance with homemaking, Needs assistance with gait, and Needs assistance with transfers; 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  Used to like to knit, sew and bake.  PATIENT GOALS: Wants to be able to type - currently using advanced Dragon dictation at times but prefers to type at times.  She would like to be able to write better and is interested in resuming some leisure activities such as Archivist and baking.  She also wants to keep working on being able to cut her own food and on her UE strength.   OBJECTIVE:   HAND DOMINANCE: Left  ADLs: Overall ADLs: Patient has a live in caregiver  Transfers/ambulation related to ADLs: Mod assist with sliding board transfers (previously [employer] lift).  Eating: Has a rocker knife that she can use. Used to use adapted utensils but now uses regular utensils but still will get assistance to cut food ie) when eating out.  Grooming: can brush her own hair but unable to manage jewelry ie) earrings  UB Dressing: can zip/unzip after it has been started, unable to manage buttons herself, Caregiver assists but if she has extra time, she can put on her bra, and a loose fitting pullover shirt/t-shirt  LB Dressing: dependent for LB dressing in bed and with  special sock donner for LE compression garments   Toileting: bladder trained with suprapubic catheter which she clamps off.  Dependent for bowel incontinence care.  Bathing: Sponge bath with adult washclothes.  Can bath UB with back scrubber for most of her back.  Needs help with feet (mentioned she might need a separate brush for feet)   Tub Shower transfers: Min-mod assist with slide board  Equipment: Shower seat with back, Walk in shower, bed side commode, Reacher, Sock aid, Long handled sponge, and Feeding equipment  IADLs: --  Shopping: Assisted by caregiver  Light housekeeping: Has housekeeper that comes monthly  Meal Prep: previously enjoyed baking. Assisted by caregiver but described recent success at reheating a meal for herself after getting food out of the fridge/freezer from her WC.  Community mobility: Dependent  Medication management: Caregiver sorts them into pillbox but she is very aware of her medications   Financial management: Patient manages her own finances  Handwriting: Increased time and has  a pen with a little grip  MOBILITY STATUS: Independent with power mobiity  POSTURE COMMENTS:  No Significant postural limitations and forward head Sitting balance: Supports self independently with both Ues  ACTIVITY TOLERANCE: Activity tolerance: Fair - MMT WFL but has limited sustained tolerance for ongoing use of UEs  FUNCTIONAL OUTCOME MEASURES: 10/20/22 QuickDash 31.8 points   UPPER EXTREMITY ROM:   AROM - WFL without obvious contractures, some digital flexion noted but PROM WNL   AROM Right (eval) Left (eval)  Shoulder flexion [facility]  Shoulder abduction [facility]  Elbow flexion [facility] WFL  Elbow extension [facility]  Wrist flexion [facility] WFL  Wrist extension WFL WFL  Ulnar deviation WFL Decreased ulnar  deviation past midline  Wrist pronation [facility] WFL  Wrist supination [facility]  Digit Composite Flexion Lacks full AROM:   1st digit - 5 cm   3rd digit -1 cm    4th digit - 2 cm  Lacks full AROM:   1st digit - 1.5 cm  5th digit - 3 cm   Digit Composite Extension [facility]  Digit Opposition Opposition to index finger only Lacks to pinkie due to limited DIP pinkie flexion  (Blank rows = not tested)  UPPER EXTREMITY MMT:   Grossly WFL - Endurance limited R tricep strength > than L but L UE generally stronger than R UE  MMT Right (eval) Left (eval)  Shoulder flexion 4/5 4/5  Shoulder abduction 4/5 4/5  Elbow flexion 4/5 4/5  Elbow extension 4/5 4-/5  (Blank rows = not tested)  HAND FUNCTION: Grip strength: Right: 4.8 lbs; Left: 20.9 lbs 11/17/22 Right - 7.2 lbs  COORDINATION: Finger Nose Finger test: R generally WFL, L WNL Box and Blocks:  Right 28 blocks, Left 37 blocks R hand finger eventually cramps and dexterity worsens in the cold  SENSATION: Light touch: Impaired  - patient   EDEMA: NA for UEs but LE has poor lymph drainage with custom compression garments   MUSCLE TONE: Generally WFL but will assess further  COGNITION: Overall cognitive status: Within functional limits for tasks assessed  VISION: Subjective report: Patent wears progressive lens/glasses.  Denies diplopia or vision changes but has eye exam in the next couple of months. Baseline vision: Wears glasses all the time  VISION ASSESSMENT: WFL  EVALUATION OBSERVATIONS: Patient independent with power WC navigation within clinic.  Patient is well-kept with foley catheter in place.  She has slight limitations in full extension of digits but PROM is WNL and she has splints at home that she said she will bring for OT staff to assess.  She has functional ROM of B UEs to reach her head, behind her back and to cross midline to assist with ADLs.     TODAY'S TREATMENT:                                                                                                                            -  Wheelchair Management  Patient assisted with modifications to joystick to  increased stability when the joystick is swung away from forward position.  OTR attempted adjustments with Allen wrenches from patient but stability not as successfully obtained as it was earlier this week.  They are encouraged to reach out to vendor for further assistance.   - Therapeutic Activities  Typing speed/dexterity assessed with standard keyboard. Keyboard positioned on her slide board in her lap as this was most comfortable position and she was able to complete two separate 1 minute typing assessments with screen words written out for her to look at on table top.   Trial 1 - 15 WPM with 1 error Trial 2 - 18 WPM with 3 errors She is noted to use her middle fingers to most for typing and then her index fingers with limited use of ring/pinkie finger when typing for speed.  Errors were r/t double tapping a letter or accidentally typing a letter with her ring finger.  Considerations given to isolate index/middle fingers and Oval 8 finger splints trialed on ring fingers to keep finger out of the way.  She is willing to try this in future sessions and plans to bring her own keyboards etc for trial. PATIENT EDUCATION: Education details: Keyboarding considerations person educated: Patient and Caregiver Live in caregiver - [name] Education method: Explanation and Verbal cues,  Education comprehension: verbalized understanding and needs further education   HOME EXERCISE PROGRAM: 11/25/2022: NMES use  12/02/22: All previous HEPs combined to 1 complete List through MedBridge Access Code: [email or web address] URL: [email or web address] Date: 12/02/2022 Prepared by: Amada Kingfisher   GOALS:   SHORT TERM GOALS:   Patient will be able to use AE/modified techniques to cut soft foods small enough for oral intake. Baseline: Caregiver/spouse assist Goal status: MET  2.  Patient will be able to use AE/modified positioning to complete word search by drawing lines through words with 0 errors. Baseline:  TBD Goal status: NOT MET - Goal discontinued as patient focusing on writing more than word search ie) note taking etc.    3.  Patient will verbalize understanding of good pressure relief schedule (for 15 to 60 seconds every 15 to 60 minutes) to help with wound healing. Baseline: Patient did not change positioning in > 45 minutes of OT eval. Goal status: MET  4.  Patient will be assisted to explore modifications for leisure tasks (knit/sew/bake) with good return demonstration. Baseline: Minimal involvement Goal status: MET  11/17/22 - trialled knitting  12/02/22 & 12/20/22 - continued AE exploration 01/03/23 Baking is incomplete (see long term goal)  5.  Patient will demonstrate independence with HEP for UE strengthening, coordination and ROM to prevent contractures and maintain strength for transfers and ADLs. Baseline: Previous HEPs have been established but need to be reviewed and updated.  Goal status: MET  11/17/22 working on lists and categorizing activities 12/02/22 Comprehensive Med[employer] list compiled  6.  Patient will be assessed for typing speed/dexterity. Baseline: Patient reports difficulty with typing with all her fingers. Goal status: REVISED - see long term goals   LONG TERM GOALS: Target date: 02/18/23  Patient will complete 1 small craft/baking etc week r/t her leisure interests to work on [facility] daily. Baseline: Minimal involvement Goal status: REVISED  2.  Patient will improve B coordination for increased typing speed/dexterity x 1-2 WPM. Baseline: formal assessment needed Goal status: IN PROGRESS  11/17/22 daily engagement with typing at home although modified use of digits for speed  3.  Patient  will improve B UE coordination, strength and functional use to bake cookies with setup assistance and AE/modified techniques as appropriate.  Baseline: Not performed. Goal status: IN PROGRESS  4.  Patient will report no more than moderate difficulty using a knife to foods such as  chicken, small enough for oral intake using AE and strategies as needed. Baseline: Caregiver/spouse assist Goal status: IN PROGRESS   5.  Patient will be able to use AE/modified positioning to complete handwritten list 100% legibility. Baseline: Subjective reports of difficulty with writing Goal status: REVISED   ASSESSMENT:  CLINICAL IMPRESSION:   Pt continues to benefit from adaptive equipment recommendations and problem solving strategies to improve participation in ADLs and IADLs. Pt benefiting from skilled OT services in the outpatient setting to work on UE impairments and provide progression of HEP ideas, explore AE and to help pt return to PLOF as able.     PERFORMANCE DEFICITS: in functional skills including ADLs, IADLs, coordination, dexterity, strength, muscle spasms, Fine motor control, Gross motor control, continence, skin integrity, and UE functional use,   IMPAIRMENTS: are limiting patient from ADLs, IADLs, work, and leisure.   CO-MORBIDITIES: has co-morbidities such as incontinence and wound  that affects occupational performance. Patient will benefit from skilled OT to address above impairments and improve overall function.  REHAB POTENTIAL: Fair due to chronicity of injury   PLAN:  OT FREQUENCY: 1-2x/week  OT DURATION: additional 6 weeks  PLANNED INTERVENTIONS: self care/ADL training, therapeutic exercise, therapeutic activity, neuromuscular re-education, manual therapy, passive range of motion, balance training, functional mobility training, splinting, patient/family education, energy conservation, coping strategies training, and DME and/or AE instructions  RECOMMENDED OTHER SERVICES: None at this time  CONSULTED AND AGREED WITH PLAN OF CARE: Patient and family member/caregiver  PLAN FOR NEXT SESSION:   Typing assessment with Oval 8 splints  Review positioning of R hand to help with finger opposition and isolation of finger joints.  Review positioning of L  hand to help with writing/printing activities.  Continue FM tasks to improve progression of ADLs/IADLS hobby/craft activities (ie baking, yarn activities & baking).    Victorino Sparrow, OT 01/06/2023, 2:32 PM

## 2023-01-06 NOTE — Therapy (Signed)
OUTPATIENT PHYSICAL THERAPY NEURO TREATMENT - RECERTIFICATION   Patient Name: LYNNELLE MESMER MRN: [id number] DOB:[birth year], 72 y.o., female Today's Date: 01/06/2023   PCP: [name], MD REFERRING PROVIDER: [name], MD   END OF SESSION:  PT End of Session - 01/06/23 1150     Visit Number 24    Number of Visits 32   recert   Date for PT Re-Evaluation 02/04/23   to allow for scheduling delays   Authorization Type [employer] MEDICARE    Progress Note Due on Visit 30    PT Start Time 1150   from OT session   PT Stop Time 1230    PT Time Calculation (min) 40 min    Equipment Utilized During Treatment Gait belt    Activity Tolerance Patient tolerated treatment well    Behavior During Therapy [facility] for tasks assessed/performed                     Past Medical History:  Diagnosis Date   CERVICAL POLYP 03/11/2008   Qualifier: Diagnosis of  By: [name] MD, [name]    Colon polyps 2005   on colonscopy [name]   Fibroid 2004   Per Dr. [name]   History of shingles    face and mouth   Hx of skin cancer, basal cell    Rosacea    Sciatica of left side 09/28/2013   Scoliosis    noted on mri done for back pain   Past Surgical History:  Procedure Laterality Date   BUNIONECTOMY     Patient Active Problem List   Diagnosis Date Noted   Buttock wound, left, initial encounter 11/02/2022   Orthostatic hypotension 08/13/2022   Neurogenic bowel 05/03/2022   Spasticity 05/03/2022   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/03/2022   Nerve pain 05/03/2022   Medication monitoring encounter 01/08/2022   Neurogenic bladder 10/11/2021   Urinary incontinence 10/11/2021   ESBL (extended spectrum beta-lactamase) producing bacteria infection 10/09/2021   Recurrent UTI 10/09/2021   Quadriplegia, C5-C7 incomplete (HCC) 01/16/2021   History of spinal fracture 01/16/2021   Suprapubic catheter (HCC) 01/16/2021   Encounter for routine gynecological examination 09/28/2013   Onychomycosis  09/28/2013   Foot deformity, acquired 03/26/2012   Encounter for preventive health examination 12/25/2010   ROSACEA 08/25/2009   Disturbance in sleep behavior 03/11/2008   SKIN CANCER, HX OF 03/11/2008   DYSURIA, HX OF 03/11/2008   Hyperlipidemia 02/10/2007   CERVICALGIA 02/10/2007    ONSET DATE: 09/28/2022 (most recent referral)  REFERRING DIAG: G82.54 (ICD-10-CM) - Quadriplegia, C5-C7, incomplete (HCC)  THERAPY DIAG:  Abnormal posture  Muscle weakness (generalized)  Quadriplegia, C5-C7 incomplete (HCC)  Other abnormalities of gait and mobility  Rationale for Evaluation and Treatment: Rehabilitation  SUBJECTIVE:  SUBJECTIVE STATEMENT: Pt and [name] report great news today, wound is healed and doctor will send note to [facility] center!  [name] also reports that patient's bed at home is 24" tall (very tall).  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 Yes: NPRS scale: 3/10 Pain location: forearms to fingertips Pain description: constant, pinprick/tingling Aggravating factors: nighttime Relieving factors: nothing, sometimes medicines  PRECAUTIONS: Fall; suprapubic catheter (she wants to get this removed meaning she needs to get to and from the toilet); she has had minor heat sensation when needing to complete her bowel program-possible AD?  WEIGHT BEARING RESTRICTIONS: No-pt was in stander at most recent therapy in [place] last week.  She will have a bone density done soon w/ Dr. [name].  FALLS: Has patient fallen in last 6 months? No  LIVING ENVIRONMENT: Lives with: lives with their spouse and live-in nurse [name] Lives in: House/apartment-townhouse Stairs:  No-level entry, but multi-level home 4 stories w/ elevator Has following equipment at home: Wheelchair (power), Wheelchair (manual), Grab bars, and standing frame, sliding board, transport shower chair, handheld shower head, leg strap-pt reports she no longer finds this helpful  PLOF: Requires assistive device for independence, Needs assistance with ADLs, Needs assistance with homemaking, and Needs assistance with transfers  OCCUPATION:  Writer-uses Dragon to dictate  PATIENT GOALS: "Make my right leg work."  Stand and pivot so she can more easily access a toilet.  OBJECTIVE:   DIAGNOSTIC FINDINGS: No recent relevant imaging.  Bone density scheduled 10/15/2022.  COGNITION: Overall cognitive status: Within functional limits for tasks assessed   SENSATION: Light touch: Diminished ability to distinguish sharp and dull, but able to distinguish light touch from injury level down accurately  COORDINATION: Not formally assessed.  EDEMA:  Well managed w/ lymphatic massage and compression stockings.  MUSCLE TONE: Pt has intermittent clonus during transfers.  POSTURE: rounded shoulders, posterior pelvic tilt, and right thoracic scoliosis   LOWER EXTREMITY ROM:     Passive  Right 10/20/22 Left 10/20/22  Hip flexion [facility]  Hip extension    Hip abduction    Hip adduction    Hip internal rotation [facility] WFL  Hip external rotation [facility]  Knee flexion [facility] WFL  Knee extension [facility] WFL  Ankle dorsiflexion Slight PF contracture Slight PF contracture  Ankle plantarflexion    Ankle inversion    Ankle eversion     (Blank rows = not tested)    LOWER EXTREMITY MMT:    MMT Right 10/20/22 Left 10/20/22  Hip flexion 1 2+  Hip extension    Hip abduction    Hip adduction    Hip internal rotation    Hip external rotation    Knee flexion 2- 3  Knee extension 2- 3  Ankle dorsiflexion 0 3  Ankle plantarflexion    Ankle inversion    Ankle eversion     (Blank rows = not tested)   BED  MOBILITY:  Sit to supine Mod A Supine to sit Mod A Rolling to Right Mod A Rolling to Left Mod A Undulating mattress for wound management on standard bed (elevated-so often doing uphill sliding board transfers); she would like to continue working on sitting up independently, she has been working on rolling, needs less assistance w/ this when someone props her leg into hooklying; would like something to help her pull her left  leg to her butt for stretching as well as bed mobility.  FUNCTIONAL TESTS:  None relevant to pt's current functional level and abilities.  PATIENT SURVEYS:  None completed due to time.  TODAY'S TREATMENT:        Pt received seated in PWC. Bump transfer PWC to level mat table. Sit to stand x 3 reps from elevated mat table with max A to perform stand with therapist standing in front of patient, R knee blocked by therapist's knees, pt places her BUE on therapist's shoulders. Pt able to stand in this manner x 3 reps. Pt does require max A to perform each stand but then once in standing she only needs min to mod A to maintain her standing balance. Pt able to stand for less than 30 sec for each repetition.  Sit to supine on mat table with mod A needed for BLE management. Supine to prone with min A needed for RUE management. Prone press-ups 2 x 10 reps. Prone on mat to press-up position x 5 reps to fatigue. Prone to supine with mod A needed when rolling onto L side due to difficulty maneuvering LUE up overhead. Supine to prone rolling onto R side with CGA. Prone to supine rolling onto R side with CGA. Supine RLE AAROM hip flexion and knee flex/ext x 5 reps to fatigue. Supine to sidelying with setup A for RLE. Sidelying to sitting EOB with assist x 2 needed for BLE management and trunk control due to onset of pain in L periscapular region that resolves at rest. Slide board transfer mat table to PWC. Pt left seated in PWC with [name] at end of session.   PATIENT EDUCATION: Education  details: Insurance account manager at home, PT POC Person educated: Patient and Arts administrator Education method: Explanation Education comprehension: verbalized understanding  HOME EXERCISE PROGRAM: Will be established as needed as pt has done continuous therapy and is working towards functional tasks.  GOALS: Goals reviewed with patient? Yes   NEW SHORT TERM GOALS=LONG TERM GOALS due to length of POC   NEW LONG TERM GOALS:  Target date: 01/12/2023  Pt will perform squat pivot transfer w/ no more than modA in order to improve access to home environment for toilet transfers. Baseline: minA for lateral scoot transfer, pt able to clear bottom intermittently; min A for "bump" transfer with assist just needed for LE management (7/2), min A to SBA for SB transfer (7/23) Goal status: IN PROGRESS  2.  Pt will report wound healing in order to return to day program at [facility]. Baseline:  Ischial wound, pt reports wound is healing but does have tunneling so she continues to utilize a wound vac and wound care services (7/2), pt reports wound has healed-is going to reach out to [facility] about returning to day program (7/25) Goal status: MET  3.  Pt will perform sit to stand transfer with no more than mod A with LRAD to demonstrate improved capacity for higher level transfers and access to uneven surfaces Baseline: max A to stand to stedy as of 7/2, min A to stand to stedy as of 7/23 Goal status: MET  4.  Pt will demonstrate ability to place slide board independently in preparation for slide board transfers Baseline: dependent for placement (7/2), pt can place SB independently (7/23) Goal status: MET  5.  Pt and caregiver will demonstrate independence with use of patient's personal NMES device for safe muscle activation and strengthening in her home environment Baseline: min cueing  for safe setup (7/2), pt and caregiver report ability to perform NMES independently at home (7/25) Goal  status: MET  NEW NEW SHORT TERM GOALS=LONG TERM GOALS:  due to length of POC   NEW NEW LONG TERM GOALS:  Target date: 02/04/2023  Patient will be able to tolerate standing x 5 min via least restrictive method to demonstrate increased LE strength and upright tolerance Baseline: 1 min in stedy (7/23) Goal status: INITIAL  2.  Patient will demonstrate ability to transition into tall-kneeling and/or quadruped positions with +1 assistance on mat table to work on functional strengthening Baseline:  Goal status: INITIAL  3.  Pt will perform squat pivot transfer w/ no more than modA in order to improve access to home environment for toilet transfers. Baseline: minA for lateral scoot transfer, pt able to clear bottom intermittently; min A for "bump" transfer with assist just needed for LE management (7/2), min A to SBA for SB transfer (7/23) Goal status: IN PROGRESS    ASSESSMENT:  CLINICAL IMPRESSION: Emphasis of skilled PT session on reassessing LTG and creating new LTG in preparation for recertification of PT services as well as continuing to work on standing via least restrictive method, prone positioning, and transitions supine to/from prone. Pt has met 4/5 LTG due to demonstrating improved ability to stand with decreased physical assistance, progressing to being able to place her SB independently for transfers, demonstrating wound healing so that she can safely transition to next avenue of care, and being independent with setup and use of her NMES. Pt continues to benefit from skilled therapy services to continue to work towards increased safety and independence with functional transfers, standing, bed mobility, and ability to progress to performing tall-kneeling and/or quadruped positioning on mat table for functional isolation of LE musculature for strengthening. Continue POC.   OBJECTIVE IMPAIRMENTS: decreased balance, decreased coordination, decreased mobility, difficulty walking,  decreased ROM, decreased strength, hypomobility, increased edema, impaired flexibility, impaired sensation, impaired tone, impaired UE functional use, improper body mechanics, postural dysfunction, and pain.   ACTIVITY LIMITATIONS: carrying, lifting, bending, sitting, standing, squatting, stairs, transfers, bed mobility, continence, bathing, toileting, dressing, reach over head, hygiene/grooming, locomotion level, and caring for others  PARTICIPATION LIMITATIONS: meal prep, cleaning, laundry, driving, and community activity  PERSONAL FACTORS: Age, Fitness, Past/current experiences, Time since onset of injury/illness/exacerbation, and 1-2 comorbidities: intermittent AD, neurogenic bowel/bladder on active bladder training  are also affecting patient's functional outcome.   REHAB POTENTIAL: Good  CLINICAL DECISION MAKING: Evolving/moderate complexity  EVALUATION COMPLEXITY: Moderate  PLAN:  PT FREQUENCY: 2x/week  PT DURATION: 8 weeks + 4 weeks (recert) + 4 weeks (recert)  PLANNED INTERVENTIONS: Therapeutic exercises, Therapeutic activity, Neuromuscular re-education, Balance training, Patient/Family education, Self Care, DME instructions, Electrical stimulation, Wheelchair mobility training, Manual therapy, and Re-evaluation  PLAN FOR NEXT SESSION:  Least restrictive standing--can progress to stedy vs in // bars if skilled +2 available with knee immobilizers OR +2 from EOM via 3 musketeers, use of powderboard on mat table for RLE strengthening, use of leg loops/lifters to increase independence with LE management with transfers and bed mobility if pt is willing to use AE, standing in // bars with knees blocked or knee immobilizers to assess standing without stedy, bumping uphill, placing SB while seated in PWC, if +2 available could work on prone over peanut ball or tall-kneeling with bench on mat table   Peter Congo, PT, DPT, CSRS   01/06/2023, 1:45 PM

## 2023-01-10 ENCOUNTER — Ambulatory Visit (HOSPITAL_BASED_OUTPATIENT_CLINIC_OR_DEPARTMENT_OTHER): Payer: Medicare PPO | Admitting: General Surgery

## 2023-01-10 ENCOUNTER — Ambulatory Visit: Payer: Medicare PPO | Admitting: Occupational Therapy

## 2023-01-10 DIAGNOSIS — M6281 Muscle weakness (generalized): Secondary | ICD-10-CM | POA: Diagnosis not present

## 2023-01-10 DIAGNOSIS — M24541 Contracture, right hand: Secondary | ICD-10-CM | POA: Diagnosis not present

## 2023-01-10 DIAGNOSIS — R29898 Other symptoms and signs involving the musculoskeletal system: Secondary | ICD-10-CM

## 2023-01-10 DIAGNOSIS — R278 Other lack of coordination: Secondary | ICD-10-CM | POA: Diagnosis not present

## 2023-01-10 DIAGNOSIS — M24542 Contracture, left hand: Secondary | ICD-10-CM | POA: Diagnosis not present

## 2023-01-10 DIAGNOSIS — R2689 Other abnormalities of gait and mobility: Secondary | ICD-10-CM | POA: Diagnosis not present

## 2023-01-10 DIAGNOSIS — G8254 Quadriplegia, C5-C7 incomplete: Secondary | ICD-10-CM | POA: Diagnosis not present

## 2023-01-10 DIAGNOSIS — R29818 Other symptoms and signs involving the nervous system: Secondary | ICD-10-CM | POA: Diagnosis not present

## 2023-01-10 DIAGNOSIS — R293 Abnormal posture: Secondary | ICD-10-CM | POA: Diagnosis not present

## 2023-01-10 NOTE — Therapy (Unsigned)
OUTPATIENT OCCUPATIONAL THERAPY NEURO TREATMENT Patient Name: [name] MRN: [id number] DOB:June 07, 1951, 72 y.o., female Today's Date: 01/10/2023  PCP: Carmen Headings, [name]  REFERRING PROVIDER: [name], [name]  END OF SESSION:  OT End of Session - 01/10/23 1231     Visit Number 25    Number of Visits 36    Date for OT Re-Evaluation 02/18/23    Authorization Type [employer] - requires auth, [place] (additional auth requested)    Progress Note Due on Visit 36    OT Start Time 1231    OT Stop Time 1315    OT Time Calculation (min) 44 min    Equipment Utilized During Treatment power WC, computer    Activity Tolerance Patient tolerated treatment well    Behavior During Therapy [name] for tasks assessed/performed               Past Medical History:  Diagnosis Date   CERVICAL POLYP 03/11/2008   Qualifier: Diagnosis of  By: Carmen Sharp [name], [name]    Colon polyps 2005   on colonscopy Dr. Russella [name]   Fibroid 2004   Per Dr. Dareen [name]   History of shingles    face and mouth   Hx of skin cancer, basal cell    Rosacea    Sciatica of left side 09/28/2013   Scoliosis    noted on mri done for back pain   Past Surgical History:  Procedure Laterality Date   BUNIONECTOMY     Patient Active Problem List   Diagnosis Date Noted   Buttock wound, left, initial encounter 11/02/2022   Orthostatic hypotension 08/13/2022   Neurogenic bowel 05/03/2022   Spasticity 05/03/2022   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/03/2022   Nerve pain 05/03/2022   Medication monitoring encounter 01/08/2022   Neurogenic bladder 10/11/2021   Urinary incontinence 10/11/2021   ESBL (extended spectrum beta-lactamase) producing bacteria infection 10/09/2021   Recurrent UTI 10/09/2021   Quadriplegia, C5-C7 incomplete (HCC) 01/16/2021   History of spinal fracture 01/16/2021   Suprapubic catheter (HCC) 01/16/2021   Encounter for routine gynecological examination 09/28/2013   Onychomycosis 09/28/2013   Foot deformity,  acquired 03/26/2012   Encounter for preventive health examination 12/25/2010   ROSACEA 08/25/2009   Disturbance in sleep behavior 03/11/2008   SKIN CANCER, HX OF 03/11/2008   DYSURIA, HX OF 03/11/2008   Hyperlipidemia 02/10/2007   CERVICALGIA 02/10/2007    ONSET DATE: 07/28/2020  Date of Referral 09/28/22  REFERRING DIAG: G82.54 (ICD-10-CM) - Quadriplegia, C5-C7, incomplete (HCC)  THERAPY DIAG:  Other lack of coordination  Other symptoms and signs involving the nervous system  Other symptoms and signs involving the musculoskeletal system  Rationale for Evaluation and Treatment: Rehabilitation  SUBJECTIVE:   SUBJECTIVE STATEMENT:   Patient brought her new computer (Surface with attached keyboard) for practice today.  She reports still waiting for [name] vendor to contact her about tightening her joystick for her.   Pt accompanied by:  Live in Caregiver - [name]   PERTINENT HISTORY: "Pt is a 72 yr old L handed female with hx of incomplete quadriplegia- 2/14 2022- fleeing the police in [place] on passenger 100 (high speed) miles/hour,  [name]; neurogenic bowel and bladder and spasticity; [name], [name] Here for f/u on Incomplete quadriplegia"  Referral from [name] 09/28/22 states, "Please eval and treat for ADLs and higher level mobility."  PRECAUTIONS: Fall; suprapubic catheter (she wants to get this removed meaning she needs to get to and from the  toilet); she has had minor heat sensation when needing to complete her bowel program-possible AD?   WEIGHT BEARING RESTRICTIONS: No-pt was in stander at most recent therapy in [place] last week.  She will have a bone density done soon w/ Dr. Fabian [name].   PAIN:  Patient reports chronic pain 3/10 fingers to elbow bilaterally managed by medication and will inform therapy staff of any changes.  FALLS: Has patient fallen in last 6 months? No  LIVING ENVIRONMENT: Lives with: lives with their family - husband [name] and with an  adult companion s/p moving back up from [place] x10 months Lives in: House/apartment Stairs:  4 story town house with an Engineer, structural with threshold adjustments, roll in shower with transport chair Has following equipment at home: Wheelchair (power) - with seat height adjustments to access counters and reclining option, Wheelchair (manual), transport WC, shower chair, and Ramped entry, handheld showerhead with rails around toilet, had Michiel Sites but is no longer in need of it, has slide boards x3  PLOF: Requires assistive device for independence, Needs assistance with ADLs, Needs assistance with homemaking, Needs assistance with gait, and Needs assistance with transfers; 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  Used to like to knit, sew and bake.  PATIENT GOALS: Wants to be able to type - currently using advanced Dragon dictation at times but prefers to type at times.  She would like to be able to write better and is interested in resuming some leisure activities such as Archivist and baking.  She also wants to keep working on being able to cut her own food and on her UE strength.   OBJECTIVE:   HAND DOMINANCE: Left  ADLs: Overall ADLs: Patient has a live in caregiver  Transfers/ambulation related to ADLs: Mod assist with sliding board transfers (previously [employer] lift).  Eating: Has a rocker knife that she can use. Used to use adapted utensils but now uses regular utensils but still will get assistance to cut food ie) when eating out.  Grooming: can brush her own hair but unable to manage jewelry ie) earrings  UB Dressing: can zip/unzip after it has been started, unable to manage buttons herself, Caregiver assists but if she has extra time, she can put on her bra, and a loose fitting pullover shirt/t-shirt  LB Dressing: dependent for LB dressing in bed and with special sock donner for LE compression garments   Toileting: bladder trained with suprapubic catheter which she clamps off.   Dependent for bowel incontinence care.  Bathing: Sponge bath with adult washclothes.  Can bath UB with back scrubber for most of her back.  Needs help with feet (mentioned she might need a separate brush for feet)   Tub Shower transfers: Min-mod assist with slide board  Equipment: Shower seat with back, Walk in shower, bed side commode, Reacher, Sock aid, Long handled sponge, and Feeding equipment  IADLs: --  Shopping: Assisted by caregiver  Light housekeeping: Has housekeeper that comes monthly  Meal Prep: previously enjoyed baking. Assisted by caregiver but described recent success at reheating a meal for herself after getting food out of the fridge/freezer from her WC.  Community mobility: Dependent  Medication management: Caregiver sorts them into pillbox but she is very aware of her medications   Financial management: Patient manages her own finances  Handwriting: Increased time and has a pen with a little grip  MOBILITY STATUS: Independent with power mobiity  POSTURE COMMENTS:  No Significant postural limitations and forward head Sitting  balance: Supports self independently with both Ues  ACTIVITY TOLERANCE: Activity tolerance: Fair - MMT WFL but has limited sustained tolerance for ongoing use of UEs  FUNCTIONAL OUTCOME MEASURES: 10/20/22 QuickDash 31.8 points   UPPER EXTREMITY ROM:   AROM - WFL without obvious contractures, some digital flexion noted but PROM WNL   AROM Right (eval) Left (eval)  Shoulder flexion [name]  Shoulder abduction [name]  Elbow flexion [name] WFL  Elbow extension [name]  Wrist flexion [name] WFL  Wrist extension WFL WFL  Ulnar deviation WFL Decreased ulnar  deviation past midline  Wrist pronation [facility] WFL  Wrist supination [name]  Digit Composite Flexion Lacks full AROM:   1st digit - 5 cm   3rd digit -1 cm   4th digit - 2 cm  Lacks full AROM:   1st digit - 1.5 cm  5th digit - 3 cm   Digit Composite Extension [name]  Digit  Opposition Opposition to index finger only Lacks to pinkie due to limited DIP pinkie flexion  (Blank rows = not tested)  UPPER EXTREMITY MMT:   Grossly WFL - Endurance limited R tricep strength > than L but L UE generally stronger than R UE  MMT Right (eval) Left (eval)  Shoulder flexion 4/5 4/5  Shoulder abduction 4/5 4/5  Elbow flexion 4/5 4/5  Elbow extension 4/5 4-/5  (Blank rows = not tested)  HAND FUNCTION: Grip strength: Right: 4.8 lbs; Left: 20.9 lbs 11/17/22 Right - 7.2 lbs  COORDINATION: Finger Nose Finger test: R generally WFL, L WNL Box and Blocks:  Right 28 blocks, Left 37 blocks R hand finger eventually cramps and dexterity worsens in the cold  SENSATION: Light touch: Impaired  - patient   EDEMA: NA for UEs but LE has poor lymph drainage with custom compression garments   MUSCLE TONE: Generally WFL but will assess further  COGNITION: Overall cognitive status: Within functional limits for tasks assessed  VISION: Subjective report: Patent wears progressive lens/glasses.  Denies diplopia or vision changes but has eye exam in the next couple of months. Baseline vision: Wears glasses all the time  VISION ASSESSMENT: WFL  EVALUATION OBSERVATIONS: Patient independent with power WC navigation within clinic.  Patient is well-kept with foley catheter in place.  She has slight limitations in full extension of digits but PROM is WNL and she has splints at home that she said she will bring for OT staff to assess.  She has functional ROM of B UEs to reach her head, behind her back and to cross midline to assist with ADLs.     TODAY'S TREATMENT:                                                                                                                           - Therapeutic Activities  Typing speed/dexterity assessed with her personal keyboard. Keyboard positioned on table top with WC up to the edge of the table and Surface  propped in front of her.  She keeps her  elbows propped on her each wheelchair armrests, with B hands resting or floating above the tabletop. Patient engaged in typing sentence covering all the letters of the alphabet to assess finger use.  Patient mostly uses her middle fingers for typing with occasional use of her ring finger or index finger and although she does not consistently use her pinky fingers she is seen to use them when reaching out to the furthest edges of the keyboard.  Patient does make attempts to practice using other fingers which slows down her typing still at this time. Errors r/t double tapping a letter or accidentally typing a letter with her ring finger were not observed when using her personal keyboard and isolating ring finger was not trialled today.   Explore/trialled use of voice commands and/or Dragon to speak information and commands. Email sent to patient with Dragon speech recognition Command cheat sheet [email or web address] Some difficulty noted getting Dragon to appropriately respond to spelling today with phonetic alphabet as found in command cheat sheet and further trials needed. PATIENT EDUCATION: Education details: Keyboarding considerations person educated: Patient and Caregiver Live in caregiver - [name] Education method: Explanation and Verbal cues,  Education comprehension: verbalized understanding and needs further education   HOME EXERCISE PROGRAM: 11/25/2022: NMES use  12/02/22: All previous HEPs combined to 1 complete List through MedBridge Access Code: [email or web address] URL: [email or web address] Date: 12/02/2022 Prepared by: Amada Kingfisher   GOALS:   SHORT TERM GOALS:   Patient will be able to use AE/modified techniques to cut soft foods small enough for oral intake. Baseline: Caregiver/spouse assist Goal status: MET  2.  Patient will be able to use AE/modified positioning to complete word  search by drawing lines through words with 0 errors. Baseline: TBD Goal status: NOT MET - Goal discontinued as patient focusing on writing more than word search ie) note taking etc.    3.  Patient will verbalize understanding of good pressure relief schedule (for 15 to 60 seconds every 15 to 60 minutes) to help with wound healing. Baseline: Patient did not change positioning in > 45 minutes of OT eval. Goal status: MET  4.  Patient will be assisted to explore modifications for leisure tasks (knit/sew/bake) with good return demonstration. Baseline: Minimal involvement Goal status: MET  11/17/22 - trialled knitting  12/02/22 & 12/20/22 - continued AE exploration 01/03/23 Baking is incomplete (see long term goal)  5.  Patient will demonstrate independence with HEP for UE strengthening, coordination and ROM to prevent contractures and maintain strength for transfers and ADLs. Baseline: Previous HEPs have been established but need to be reviewed and updated.  Goal status: MET  11/17/22 working on lists and categorizing activities 12/02/22 Comprehensive MedBridge list compiled  6.  Patient will be assessed for typing speed/dexterity. Baseline: Patient reports difficulty with typing with all her fingers. Goal status: REVISED - see long term goals   LONG TERM GOALS: Target date: 02/18/23  Patient will complete 1 small craft/baking etc week r/t her leisure interests to work on [facility] daily. Baseline: Minimal involvement Goal status: REVISED  2.  Patient will improve B coordination for increased typing speed/dexterity x 1-2 WPM. Baseline: formal assessment needed Goal status: IN PROGRESS  11/17/22 daily engagement with typing at home although modified use of digits for speed  3.  Patient will improve B UE coordination, strength and functional use to bake cookies with setup assistance and AE/modified techniques as appropriate.  Baseline: Not performed. Goal  status: IN PROGRESS  4.  Patient will report  no more than moderate difficulty using a knife to foods such as chicken, small enough for oral intake using AE and strategies as needed. Baseline: Caregiver/spouse assist Goal status: IN PROGRESS   5.  Patient will be able to use AE/modified positioning to complete handwritten list 100% legibility. Baseline: Subjective reports of difficulty with writing Goal status: REVISED   ASSESSMENT:  CLINICAL IMPRESSION:   Pt continues to benefit from adaptive equipment and modified technique recommendations and problem solving strategies to improve participation in daily activities related to ADLs and IADLs. Pt benefiting from skilled OT services in the outpatient setting to work on UE impairments and provide progression of HEP ideas, explore AE and to help pt return to PLOF as able.  Patient also interested in more information about potential benefit of paraffin treatments for home use.   PERFORMANCE DEFICITS: in functional skills including ADLs, IADLs, coordination, dexterity, strength, muscle spasms, Fine motor control, Gross motor control, continence, skin integrity, and UE functional use,   IMPAIRMENTS: are limiting patient from ADLs, IADLs, work, and leisure.   CO-MORBIDITIES: has co-morbidities such as incontinence and wound  that affects occupational performance. Patient will benefit from skilled OT to address above impairments and improve overall function.  REHAB POTENTIAL: Fair due to chronicity of injury   PLAN:  OT FREQUENCY: 1-2x/week  OT DURATION: additional 6 weeks  PLANNED INTERVENTIONS: self care/ADL training, therapeutic exercise, therapeutic activity, neuromuscular re-education, manual therapy, passive range of motion, balance training, functional mobility training, splinting, patient/family education, energy conservation, coping strategies training, and DME and/or AE instructions  RECOMMENDED OTHER SERVICES: None at this time  CONSULTED AND AGREED WITH PLAN OF CARE:  Patient and family member/caregiver  PLAN FOR NEXT SESSION:   [employer] - research  Typing assessment with Oval 8 splints  Review positioning of R hand to help with finger opposition and isolation of finger joints.  Review positioning of L hand to help with writing/printing activities.  Continue FM tasks to improve progression of ADLs/IADLS hobby/craft activities (ie baking, yarn activities & baking).    Victorino Sparrow, OT 01/10/2023, 4:40 PM

## 2023-01-11 NOTE — Patient Instructions (Signed)
Email sent to patient with Dragon speech recognition Command cheat shee  [email or web address]

## 2023-01-12 ENCOUNTER — Telehealth: Payer: Self-pay | Admitting: *Deleted

## 2023-01-12 NOTE — Telephone Encounter (Signed)
[name] LPN called from [facility] to let Dr Berline Chough know that Mrs Wiggers continues to loose weight. She is now down to 111 lb. She would ask that Dr Berline Chough tactfully address this at her upcoming appointment on Friday 8/2 without mentioning it came from [facility] as the patient is very sensitive about this information. The [facility] nurse suspects that Mrs Reveal may have a possible eating disorder.

## 2023-01-14 ENCOUNTER — Ambulatory Visit: Payer: Medicare PPO | Attending: Physical Medicine and Rehabilitation | Admitting: Physical Therapy

## 2023-01-14 ENCOUNTER — Encounter: Payer: Medicare PPO | Attending: Physical Medicine and Rehabilitation | Admitting: Physical Medicine and Rehabilitation

## 2023-01-14 ENCOUNTER — Encounter: Payer: Self-pay | Admitting: Physical Medicine and Rehabilitation

## 2023-01-14 VITALS — BP 90/57 | HR 85 | Ht 64.0 in | Wt 111.0 lb

## 2023-01-14 DIAGNOSIS — K592 Neurogenic bowel, not elsewhere classified: Secondary | ICD-10-CM | POA: Insufficient documentation

## 2023-01-14 DIAGNOSIS — R252 Cramp and spasm: Secondary | ICD-10-CM | POA: Diagnosis not present

## 2023-01-14 DIAGNOSIS — M792 Neuralgia and neuritis, unspecified: Secondary | ICD-10-CM | POA: Insufficient documentation

## 2023-01-14 DIAGNOSIS — N319 Neuromuscular dysfunction of bladder, unspecified: Secondary | ICD-10-CM | POA: Insufficient documentation

## 2023-01-14 DIAGNOSIS — Z993 Dependence on wheelchair: Secondary | ICD-10-CM | POA: Diagnosis not present

## 2023-01-14 DIAGNOSIS — G8254 Quadriplegia, C5-C7 incomplete: Secondary | ICD-10-CM | POA: Diagnosis not present

## 2023-01-14 DIAGNOSIS — R293 Abnormal posture: Secondary | ICD-10-CM | POA: Diagnosis not present

## 2023-01-14 DIAGNOSIS — R2689 Other abnormalities of gait and mobility: Secondary | ICD-10-CM | POA: Diagnosis not present

## 2023-01-14 DIAGNOSIS — I951 Orthostatic hypotension: Secondary | ICD-10-CM | POA: Diagnosis not present

## 2023-01-14 DIAGNOSIS — Z9359 Other cystostomy status: Secondary | ICD-10-CM | POA: Insufficient documentation

## 2023-01-14 DIAGNOSIS — M6281 Muscle weakness (generalized): Secondary | ICD-10-CM | POA: Insufficient documentation

## 2023-01-14 DIAGNOSIS — R278 Other lack of coordination: Secondary | ICD-10-CM | POA: Insufficient documentation

## 2023-01-14 DIAGNOSIS — R29818 Other symptoms and signs involving the nervous system: Secondary | ICD-10-CM | POA: Diagnosis not present

## 2023-01-14 MED ORDER — BACLOFEN 20 MG PO TABS: 40.0000 mg | ORAL_TABLET | Freq: Three times a day (TID) | ORAL | 3 refills | Status: DC

## 2023-01-14 NOTE — Patient Instructions (Signed)
Pt is a 72 yr old L handed female with hx of incomplete quadriplegia- 2/14 2022- fleeing the police in [place] on passenger 100 (high speed) miles/hour,  [name]; neurogenic bowel and bladder and spasticity; [name], [name] Here for f/u on Incomplete quadriplegia   We discussed nerve pain at length- discussed additional nerve pain meds- 2. Decided to try Gabapentin 1200/600/600/1200-  we also discussed 1 extra 600 mg tab if needed. Let me know.   3.  Discussed Low dose naltrexone- and went over side effects.   4.  Lay on stomach 5-10 minutes  2x/day- usually best with waking and with bedtime.  - can also push foot into plantarflexion- for sustained period, can stop clonus.   5. Discussed compression socks- vs compression stockings- 6 for $20- to gradually reduce pressure- goal to not have dizziness/lightheadedness- not worried about actual BP.   6.  Discussed  bowels- program vs  trying without- and try to not do bowel program- and then do every OTHER time- and then wean off-    7. Con't Myrbtetriq- but doesn't need refill right now.   8.  Trying to eat more- and feels likes she's too low in weight- has added calories to diet- and will see-   9. Would rather you be 5 lbs over then 5 lbs underweight.   10. F/U in 3 months- double appt SCI

## 2023-01-14 NOTE — Therapy (Signed)
OUTPATIENT PHYSICAL THERAPY NEURO TREATMENT   Patient Name: [name] MRN: [id number] DOB:[birth year], 72 y.o., female Today's Date: 01/14/2023   PCP: [name], MD REFERRING PROVIDER: [name], MD   END OF SESSION:  PT End of Session - 01/14/23 1449     Visit Number 25    Number of Visits 32   recert   Date for PT Re-Evaluation 02/04/23   to allow for scheduling delays   Authorization Type [employer] MEDICARE    Progress Note Due on Visit 30    PT Start Time 1447    PT Stop Time 1530    PT Time Calculation (min) 43 min    Equipment Utilized During Treatment Gait belt    Activity Tolerance Patient tolerated treatment well    Behavior During Therapy [facility] for tasks assessed/performed                      Past Medical History:  Diagnosis Date   CERVICAL POLYP 03/11/2008   Qualifier: Diagnosis of  By: [name] MD, [name]    Colon polyps 2005   on colonscopy [name]   Fibroid 2004   Per Dr. [name]   History of shingles    face and mouth   Hx of skin cancer, basal cell    Rosacea    Sciatica of left side 09/28/2013   Scoliosis    noted on mri done for back pain   Past Surgical History:  Procedure Laterality Date   BUNIONECTOMY     Patient Active Problem List   Diagnosis Date Noted   Buttock wound, left, initial encounter 11/02/2022   Orthostatic hypotension 08/13/2022   Neurogenic bowel 05/03/2022   Spasticity 05/03/2022   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/03/2022   Nerve pain 05/03/2022   Medication monitoring encounter 01/08/2022   Neurogenic bladder 10/11/2021   Urinary incontinence 10/11/2021   ESBL (extended spectrum beta-lactamase) producing bacteria infection 10/09/2021   Recurrent UTI 10/09/2021   Quadriplegia, C5-C7 incomplete (HCC) 01/16/2021   History of spinal fracture 01/16/2021   Suprapubic catheter (HCC) 01/16/2021   Encounter for routine gynecological examination 09/28/2013   Onychomycosis 09/28/2013   Foot deformity,  acquired 03/26/2012   Encounter for preventive health examination 12/25/2010   ROSACEA 08/25/2009   Disturbance in sleep behavior 03/11/2008   SKIN CANCER, HX OF 03/11/2008   DYSURIA, HX OF 03/11/2008   Hyperlipidemia 02/10/2007   CERVICALGIA 02/10/2007    ONSET DATE: 09/28/2022 (most recent referral)  REFERRING DIAG: G82.54 (ICD-10-CM) - Quadriplegia, C5-C7, incomplete (HCC)  THERAPY DIAG:  Abnormal posture  Muscle weakness (generalized)  Quadriplegia, C5-C7 incomplete (HCC)  Other abnormalities of gait and mobility  Rationale for Evaluation and Treatment: Rehabilitation  SUBJECTIVE:  SUBJECTIVE STATEMENT: Pt reports no acute changes since last visit, should hear back from [place] [facility] in about 2 weeks. Pt more fatigued this PM.  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 Yes: NPRS scale: 3/10 Pain location: forearms to fingertips Pain description: constant, pinprick/tingling Aggravating factors: nighttime Relieving factors: nothing, sometimes medicines  PRECAUTIONS: Fall; suprapubic catheter (she wants to get this removed meaning she needs to get to and from the toilet); she has had minor heat sensation when needing to complete her bowel program-possible AD?  WEIGHT BEARING RESTRICTIONS: No-pt was in stander at most recent therapy in [place] last week.  She will have a bone density done soon w/ Dr. [name].  FALLS: Has patient fallen in last 6 months? No  LIVING ENVIRONMENT: Lives with: lives with their spouse and live-in nurse [name] Lives in: House/apartment-townhouse Stairs: No-level entry, but multi-level home 4 stories w/ elevator Has following  equipment at home: Wheelchair (power), Wheelchair (manual), Grab bars, and standing frame, sliding board, transport shower chair, handheld shower head, leg strap-pt reports she no longer finds this helpful  PLOF: Requires assistive device for independence, Needs assistance with ADLs, Needs assistance with homemaking, and Needs assistance with transfers  OCCUPATION:  Writer-uses Dragon to dictate  PATIENT GOALS: "Make my right leg work."  Stand and pivot so she can more easily access a toilet.  OBJECTIVE:   DIAGNOSTIC FINDINGS: No recent relevant imaging.  Bone density scheduled 10/15/2022.  COGNITION: Overall cognitive status: Within functional limits for tasks assessed   SENSATION: Light touch: Diminished ability to distinguish sharp and dull, but able to distinguish light touch from injury level down accurately  COORDINATION: Not formally assessed.  EDEMA:  Well managed w/ lymphatic massage and compression stockings.  MUSCLE TONE: Pt has intermittent clonus during transfers.  POSTURE: rounded shoulders, posterior pelvic tilt, and right thoracic scoliosis   LOWER EXTREMITY ROM:     Passive  Right 10/20/22 Left 10/20/22  Hip flexion [facility]  Hip extension    Hip abduction    Hip adduction    Hip internal rotation [facility] WFL  Hip external rotation [facility]  Knee flexion [facility] WFL  Knee extension [facility] WFL  Ankle dorsiflexion Slight PF contracture Slight PF contracture  Ankle plantarflexion    Ankle inversion    Ankle eversion     (Blank rows = not tested)    LOWER EXTREMITY MMT:    MMT Right 10/20/22 Left 10/20/22  Hip flexion 1 2+  Hip extension    Hip abduction    Hip adduction    Hip internal rotation    Hip external rotation    Knee flexion 2- 3  Knee extension 2- 3  Ankle dorsiflexion 0 3  Ankle plantarflexion    Ankle inversion    Ankle eversion     (Blank rows = not tested)   BED MOBILITY:  Sit to supine Mod A Supine to sit Mod A Rolling to Right Mod  A Rolling to Left Mod A Undulating mattress for wound management on standard bed (elevated-so often doing uphill sliding board transfers); she would like to continue working on sitting up independently, she has been working on rolling, needs less assistance w/ this when someone props her leg into hooklying; would like something to help her pull her left leg to her butt for stretching as well as  bed mobility.  FUNCTIONAL TESTS:  None relevant to pt's current functional level and abilities.  PATIENT SURVEYS:  None completed due to time.  TODAY'S TREATMENT:        Lateral scoot transfer PWC to mat table with min A for BLE management (increased difficulty this PM due to fatigue). Sit to stand x 3 reps in stedy (mod to max A, difficulty staying up longer than a few sec due to fatigue).  Seated balance EOM with upside down chair and 2 pillows placed behind patient: Dowel rod chest press x 10 reps Dowel rod OH lift 3 x 10 reps Dowel rod volleyball 3 x 10 reps  Seated core stability sitting EOM no back support or UE support 3 x 30 sec each  Lateral scoot transfer back to PWC at end of session with min A for LE management. Pt with increased difficulty performing transfer back to PWC due to fatigue.   PATIENT EDUCATION: Education details: Insurance account manager at home, PT POC Person educated: Patient and Arts administrator Education method: Explanation Education comprehension: verbalized understanding  HOME EXERCISE PROGRAM: Will be established as needed as pt has done continuous therapy and is working towards functional tasks.  GOALS: Goals reviewed with patient? Yes   NEW NEW SHORT TERM GOALS=LONG TERM GOALS:  due to length of POC   NEW NEW LONG TERM GOALS:  Target date: 02/04/2023  Patient will be able to tolerate standing x 5 min via least restrictive method to demonstrate increased LE strength and upright tolerance Baseline: 1 min in stedy (7/23) Goal status: INITIAL  2.  Patient will  demonstrate ability to transition into tall-kneeling and/or quadruped positions with +1 assistance on mat table to work on functional strengthening Baseline:  Goal status: INITIAL  3.  Pt will perform squat pivot transfer w/ no more than modA in order to improve access to home environment for toilet transfers. Baseline: minA for lateral scoot transfer, pt able to clear bottom intermittently; min A for "bump" transfer with assist just needed for LE management (7/2), min A to SBA for SB transfer (7/23) Goal status: IN PROGRESS    ASSESSMENT:  CLINICAL IMPRESSION: Emphasis of skilled PT session on working on standing in stedy as tolerated as well as core strengthening and stability. Pt with increased fatigue this date making transfers more difficult, leading to increased physical assist needed with sit to stands, and decreased tolerance for standing. Pt continues to benefit from skilled therapy services to work towards increased safety and independence with functional mobility. Continue POC.   OBJECTIVE IMPAIRMENTS: decreased balance, decreased coordination, decreased mobility, difficulty walking, decreased ROM, decreased strength, hypomobility, increased edema, impaired flexibility, impaired sensation, impaired tone, impaired UE functional use, improper body mechanics, postural dysfunction, and pain.   ACTIVITY LIMITATIONS: carrying, lifting, bending, sitting, standing, squatting, stairs, transfers, bed mobility, continence, bathing, toileting, dressing, reach over head, hygiene/grooming, locomotion level, and caring for others  PARTICIPATION LIMITATIONS: meal prep, cleaning, laundry, driving, and community activity  PERSONAL FACTORS: Age, Fitness, Past/current experiences, Time since onset of injury/illness/exacerbation, and 1-2 comorbidities: intermittent AD, neurogenic bowel/bladder on active bladder training  are also affecting patient's functional outcome.   REHAB POTENTIAL:  Good  CLINICAL DECISION MAKING: Evolving/moderate complexity  EVALUATION COMPLEXITY: Moderate  PLAN:  PT FREQUENCY: 2x/week  PT DURATION: 8 weeks + 4 weeks (recert) + 4 weeks (recert)  PLANNED INTERVENTIONS: Therapeutic exercises, Therapeutic activity, Neuromuscular re-education, Balance training, Patient/Family education, Self Care, DME instructions, Electrical stimulation, Wheelchair mobility training, Manual therapy, and  Re-evaluation  PLAN FOR NEXT SESSION:  Least restrictive standing--can progress to stedy vs in // bars if skilled +2 available with knee immobilizers OR +2 from EOM via 3 musketeers, use of powderboard on mat table for RLE strengthening, use of leg loops/lifters to increase independence with LE management with transfers and bed mobility if pt is willing to use AE, standing in // bars with knees blocked or knee immobilizers to assess standing without stedy, bumping uphill, placing SB while seated in PWC, if +2 available could work on prone over peanut ball or tall-kneeling with bench on mat table   Peter Congo, PT, DPT, CSRS   01/14/2023, 3:30 PM

## 2023-01-14 NOTE — Progress Notes (Signed)
Subjective:    Patient ID: [name], female    DOB: [birth year], 72 y.o.   MRN: [id number]  HPI  Pt is a 72 yr old L handed female with hx of incomplete quadriplegia- 2/14 2022- fleeing the police in [place] on passenger 100 (high speed) miles/hour,  [name]; neurogenic bowel and bladder and spasticity; [name], [name] Here for f/u on Incomplete quadriplegia    Aide: [name]   Things "leveling off".   Has gabapentin 2 in AM, 1 in mid-afternoon, and 3 at night-   Pain in arms- burning- very much ramps up 4-5 pm  Has tried Cymbalta- didn't tolerate- grogginess-  Has tried Lyrica- at [place]- doesn't remember why didn't work.  Never tried Keppra or Trileptal.   We discussed also low dose natrexone.    Having a lot more clonus than ever had.   Mainly uncomfortable and hard to stop.   Has tempurpedic mattress- really squishy. And air mattress overlay- works great to prevent pressure ulcers.    Asking about compression socks-  Usually 86-90's/50's   In bladder control holding pattern- clamps off- and at 3-3.5 hours- or gets on shower chair- voids like "real person"-  but sometimes needs to unclamp to see what's going on.   Bowels- " bowel function coming back? Can tell when needs to have BM-  Hasn't had accident lately.  Rectum is full when does bowel program.  Hasn't done dig stim for months- usually just suppository.  No oral bowel meds.    Hasn't gone to [place]- will hear from them this month to see if can go- pressure ulcer healed.     Pain Inventory Average Pain 3 Pain Right Now 4 My pain is burning  In the last 24 hours, has pain interfered with the following? General activity 0 Relation with others 0 Enjoyment of life 0 What TIME of day is your pain at its worst? evening Sleep (in general) Fair  Pain is worse with:  . Pain improves with: medication Relief from Meds: 8  Family History  Problem Relation Age of Onset    Hypertension Father    Osteoporosis Other    Breast cancer Neg Hx    Social History   Socioeconomic History   Marital status: Married    Spouse name: Not on file   Number of children: Not on file   Years of education: Not on file   Highest education level: Doctorate  Occupational History   Not on file  Tobacco Use   Smoking status: Never   Smokeless tobacco: Never  Vaping Use   Vaping status: Never Used  Substance and Sexual Activity   Alcohol use: Not Currently    Alcohol/week: 7.0 standard drinks of alcohol    Types: 7 Glasses of wine per week   Drug use: Yes    Comment: wine at night   Sexual activity: Not on file  Other Topics Concern   Not on file  Social History Narrative   Married   Spouse had CABG    [employer] professor PhD   [employer] [place]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   Had moved to [place]   hh of 2    Quadripareisis from [employer] injury     No current pets       Social Determinants of Health   Financial Resource Strain: Low Risk  (10/14/2022)   Overall Financial Resource Strain (CARDIA)    Difficulty of Paying Living Expenses: Not  hard at all  Food Insecurity: No Food Insecurity (10/14/2022)   Hunger Vital Sign    Worried About Running Out of Food in the Last Year: Never true    Ran Out of Food in the Last Year: Never true  Transportation Needs: No Transportation Needs (10/14/2022)   PRAPARE - Administrator, Civil Service (Medical): No    Lack of Transportation (Non-Medical): No  Physical Activity: Insufficiently Active (10/14/2022)   Exercise Vital Sign    Days of Exercise per Week: 2 days    Minutes of Exercise per Session: 60 min  Stress: No Stress Concern Present (10/14/2022)   [employer] of Occupational Health - Occupational Stress Questionnaire    Feeling of Stress : Not at all  Social Connections: Moderately Integrated (10/14/2022)   Social Connection and Isolation Panel [NHANES]    Frequency of Communication with Friends and Family: More than three times a week     Frequency of Social Gatherings with Friends and Family: Once a week    Attends Religious Services: Never    Database administrator or Organizations: Yes    Attends Engineer, structural: More than 4 times per year    Marital Status: Married   Past Surgical History:  Procedure Laterality Date   BUNIONECTOMY     Past Surgical History:  Procedure Laterality Date   BUNIONECTOMY     Past Medical History:  Diagnosis Date   CERVICAL POLYP 03/11/2008   Qualifier: Diagnosis of  By: [name] MD, [name]    Colon polyps 2005   on colonscopy [name]   Fibroid 2004   Per Dr. [name]   History of shingles    face and mouth   Hx of skin cancer, basal cell    Rosacea    Sciatica of left side 09/28/2013   Scoliosis    noted on mri done for back pain   BP (!) 90/57   Pulse 85   Ht 5\' 4"  (1.626 m) Comment: pt states, in wheelchair  Wt 111 lb (50.3 kg) Comment: pt states, in wheelchair  SpO2 94%   BMI 19.05 kg/m   Opioid Risk Score:   Fall Risk Score:  `1  Depression screen [facility] 2/9     10/13/2022    9:40 AM 08/13/2022   12:36 PM 03/02/2022   12:05 PM 10/09/2021    1:51 PM 02/24/2021    2:39 PM 12/10/2019    2:20 PM 04/14/2017    8:20 AM  Depression screen PHQ 2/9  Decreased Interest 0 0 0 0 0 0 0  Down, Depressed, Hopeless 0 0 0 0 0 0 0  PHQ - 2 Score 0 0 0 0 0 0 0  Altered sleeping      0   Tired, decreased energy      0   Change in appetite      0   Feeling bad or failure about yourself       0   Trouble concentrating      0   Moving slowly or fidgety/restless      0   Suicidal thoughts      0   PHQ-9 Score      0   Difficult doing work/chores      Not difficult at all      Review of Systems  Musculoskeletal:        Bilateral lower arm pain  All other systems reviewed and are negative.  Objective:   Physical Exam  Awake, alert, appropriate, frail appearing;  Hoffman's on L>R Sustained clonus B/L- but normal tone, not really increased in arms and legs On  baclofen In power w/c; joystick on L; accompanied by caregiver [name],  In new w/c- from [place] [employer].       Assessment & Plan:   Pt is a 72 yr old L handed female with hx of incomplete quadriplegia- 2/14 2022- fleeing the police in [place] on passenger 100 (high speed) miles/hour,  [name]; neurogenic bowel and bladder and spasticity; [name], [name] Here for f/u on Incomplete quadriplegia   We discussed nerve pain at length- discussed additional nerve pain meds- 2. Decided to try Gabapentin 1200/600/600/1200-  we also discussed 1 extra 600 mg tab if needed. Let me know.   3.  Discussed Low dose naltrexone- and went over side effects.   4.  Lay on stomach 5-10 minutes  2x/day- usually best with waking and with bedtime.  - can also push foot into plantarflexion- for sustained period, can stop clonus.   5. Discussed compression socks- vs compression stockings- 6 for $20- to gradually reduce pressure- goal to not have dizziness/lightheadedness- not worried about actual BP.   6.  Discussed  bowels- program vs  trying without- and try to not do bowel program- and then do every OTHER time- and then wean off-    7. Con't Myrbtetriq- but doesn't need refill right now.   8.  Trying to eat more- and feels likes she's too low in weight- has added calories to diet- and will see-   9. Would rather you be 5 lbs over then 5 lbs underweight.   10. F/U in 3 months- double appt SCI   I spent a total of 43   minutes on total care today- >50% coordination of care- due to  D/w about nerve pain, and meds; bladder and bowel and weight as detailed above- and orthostatic hypotension.

## 2023-01-18 ENCOUNTER — Ambulatory Visit: Payer: Medicare PPO | Admitting: Occupational Therapy

## 2023-01-18 ENCOUNTER — Ambulatory Visit: Payer: Medicare PPO | Admitting: Physical Therapy

## 2023-01-20 ENCOUNTER — Ambulatory Visit: Payer: Medicare PPO | Admitting: Physical Therapy

## 2023-01-24 ENCOUNTER — Ambulatory Visit: Payer: Medicare PPO | Admitting: Physical Therapy

## 2023-01-24 ENCOUNTER — Encounter: Payer: Self-pay | Admitting: Physical Therapy

## 2023-01-24 DIAGNOSIS — M6281 Muscle weakness (generalized): Secondary | ICD-10-CM | POA: Diagnosis not present

## 2023-01-24 DIAGNOSIS — G8254 Quadriplegia, C5-C7 incomplete: Secondary | ICD-10-CM

## 2023-01-24 DIAGNOSIS — R278 Other lack of coordination: Secondary | ICD-10-CM

## 2023-01-24 DIAGNOSIS — R293 Abnormal posture: Secondary | ICD-10-CM | POA: Diagnosis not present

## 2023-01-24 DIAGNOSIS — R2689 Other abnormalities of gait and mobility: Secondary | ICD-10-CM

## 2023-01-24 DIAGNOSIS — R29818 Other symptoms and signs involving the nervous system: Secondary | ICD-10-CM | POA: Diagnosis not present

## 2023-01-24 NOTE — Therapy (Signed)
OUTPATIENT PHYSICAL THERAPY NEURO TREATMENT   Patient Name: [name] MRN: [id number] DOB:Apr 05, 1951, 72 y.o., female Today's Date: 01/24/2023   PCP: [name], MD REFERRING PROVIDER: [name], MD   END OF SESSION:  PT End of Session - 01/24/23 1101     Visit Number 26    Number of Visits 32   recert   Date for PT Re-Evaluation 02/04/23   to allow for scheduling delays   Authorization Type [employer]ICARE    Progress Note Due on Visit 30    PT Start Time 1101    PT Stop Time 1147    PT Time Calculation (min) 46 min    Equipment Utilized During Treatment Gait belt    Activity Tolerance Patient tolerated treatment well    Behavior During Therapy WFL for tasks assessed/performed                      Past Medical History:  Diagnosis Date   CERVICAL POLYP 03/11/2008   Qualifier: Diagnosis of  By: [name] MD, [name]    Colon polyps 2005   on colonscopy [name]   Fibroid 2004   Per Dr. [name]   History of shingles    face and mouth   Hx of skin cancer, basal cell    Rosacea    Sciatica of left side 09/28/2013   Scoliosis    noted on mri done for back pain   Past Surgical History:  Procedure Laterality Date   BUNIONECTOMY     Patient Active Problem List   Diagnosis Date Noted   Buttock wound, left, initial encounter 11/02/2022   Orthostatic hypotension 08/13/2022   Neurogenic bowel 05/03/2022   Spasticity 05/03/2022   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/03/2022   Nerve pain 05/03/2022   Medication monitoring encounter 01/08/2022   Neurogenic bladder 10/11/2021   Urinary incontinence 10/11/2021   ESBL (extended spectrum beta-lactamase) producing bacteria infection 10/09/2021   Recurrent UTI 10/09/2021   Quadriplegia, C5-C7 incomplete (HCC) 01/16/2021   History of spinal fracture 01/16/2021   Suprapubic catheter (HCC) 01/16/2021   Encounter for routine gynecological examination 09/28/2013   Onychomycosis 09/28/2013   Foot deformity,  acquired 03/26/2012   Encounter for preventive health examination 12/25/2010   ROSACEA 08/25/2009   Disturbance in sleep behavior 03/11/2008   SKIN CANCER, HX OF 03/11/2008   DYSURIA, HX OF 03/11/2008   Hyperlipidemia 02/10/2007   CERVICALGIA 02/10/2007    ONSET DATE: 09/28/2022 (most recent referral)  REFERRING DIAG: G82.54 (ICD-10-CM) - Quadriplegia, C5-C7, incomplete (HCC)  THERAPY DIAG:  Abnormal posture  Muscle weakness (generalized)  Quadriplegia, C5-C7 incomplete (HCC)  Other abnormalities of gait and mobility  Other lack of coordination  Other symptoms and signs involving the nervous system  Rationale for Evaluation and Treatment: Rehabilitation  SUBJECTIVE:  SUBJECTIVE STATEMENT: Pt should hear from [facility] about census this Friday.  She states wound is healed.  She has been recovering well from recent bout of Covid.  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 Yes: NPRS scale: 3/10 Pain location: forearms to fingertips Pain description: constant, pinprick/tingling Aggravating factors: nighttime Relieving factors: nothing, sometimes medicines  PRECAUTIONS: Fall; suprapubic catheter (she wants to get this removed meaning she needs to get to and from the toilet); she has had minor heat sensation when needing to complete her bowel program-possible AD?  WEIGHT BEARING RESTRICTIONS: No-pt was in stander at most recent therapy in [place] last week.  She will have a bone density done soon w/ Dr. [name].  FALLS: Has patient fallen in last 6 months? No  LIVING ENVIRONMENT: Lives with: lives with their spouse and live-in nurse [name] Lives in:  House/apartment-townhouse Stairs: No-level entry, but multi-level home 4 stories w/ elevator Has following equipment at home: Wheelchair (power), Wheelchair (manual), Grab bars, and standing frame, sliding board, transport shower chair, handheld shower head, leg strap-pt reports she no longer finds this helpful  PLOF: Requires assistive device for independence, Needs assistance with ADLs, Needs assistance with homemaking, and Needs assistance with transfers  OCCUPATION:  Writer-uses Dragon to dictate  PATIENT GOALS: "Make my right leg work."  Stand and pivot so she can more easily access a toilet.  OBJECTIVE:   DIAGNOSTIC FINDINGS: No recent relevant imaging.  Bone density scheduled 10/15/2022.  COGNITION: Overall cognitive status: Within functional limits for tasks assessed   SENSATION: Light touch: Diminished ability to distinguish sharp and dull, but able to distinguish light touch from injury level down accurately  COORDINATION: Not formally assessed.  EDEMA:  Well managed w/ lymphatic massage and compression stockings.  MUSCLE TONE: Pt has intermittent clonus during transfers.  POSTURE: rounded shoulders, posterior pelvic tilt, and right thoracic scoliosis   LOWER EXTREMITY ROM:     Passive  Right 10/20/22 Left 10/20/22  Hip flexion [facility]  Hip extension    Hip abduction    Hip adduction    Hip internal rotation [facility] WFL  Hip external rotation [facility]  Knee flexion [facility] WFL  Knee extension [facility] WFL  Ankle dorsiflexion Slight PF contracture Slight PF contracture  Ankle plantarflexion    Ankle inversion    Ankle eversion     (Blank rows = not tested)    LOWER EXTREMITY MMT:    MMT Right 10/20/22 Left 10/20/22  Hip flexion 1 2+  Hip extension    Hip abduction    Hip adduction    Hip internal rotation    Hip external rotation    Knee flexion 2- 3  Knee extension 2- 3  Ankle dorsiflexion 0 3  Ankle plantarflexion    Ankle inversion    Ankle eversion      (Blank rows = not tested)   BED MOBILITY:  Sit to supine Mod A Supine to sit Mod A Rolling to Right Mod A Rolling to Left Mod A Undulating mattress for wound management on standard bed (elevated-so often doing uphill sliding board transfers); she would like to continue working on sitting up independently, she has been working on rolling, needs less assistance w/ this when someone props her leg into hooklying; would like something to help her pull her left leg to her butt for  stretching as well as bed mobility.  FUNCTIONAL TESTS:  None relevant to pt's current functional level and abilities.  PATIENT SURVEYS:  None completed due to time.  TODAY'S TREATMENT:        -Lateral scoot transfer PWC to mat table to left with min A for BLE management/foot placement.   More clonus noted in RLE during initial transfer.  From elevated mat table patient perform x3 STS using PT shoulders for stability and left knee hyperextension in standing "locked" position.  PT blocks R knee to prevent collapse and provides min-modA into stand and minA to remain standing facilitating left weight shift at pelvis and grading knee block to allow for attempts at volitional knee control.  (Stand 1:  just over 1 minute, stand 2: 1.5 minutes, stand 3: just over 2 minutes).  Patient able to unlock left knee w/ modA to prevent plop onto mat.  Mat lowered between reps to allow for rest in 90-90 LE position to unweight legs.  -Patient positioned prone w/ minA for rolling to right and positioning LE and performing elbow prop to completely turn over.  In prone PT provides passive stretch to quads, hip flexors, and DF w/ notable tone in left worse than right LE. -Had patient practice a bimanual press up and hold w/ good symmetry of trunk and shoulder blades and patient reporting no pain.  Would be appropriate to attempt quadruped in future sessions and work from there.  -Lateral scoot transfer back to [facility] to right via slideboard at  end of session with min A for LE management.  PT adjusting LE clothing and caregiver taking over from here.     PATIENT EDUCATION: Education details: Insurance account manager at home Person educated: Patient and Arts administrator Education method: Explanation Education comprehension: verbalized understanding  HOME EXERCISE PROGRAM: Will be established as needed as pt has done continuous therapy and is working towards functional tasks.  GOALS: Goals reviewed with patient? Yes   NEW NEW SHORT TERM GOALS=LONG TERM GOALS:  due to length of POC   NEW NEW LONG TERM GOALS:  Target date: 02/04/2023  Patient will be able to tolerate standing x 5 min via least restrictive method to demonstrate increased LE strength and upright tolerance Baseline: 1 min in stedy (7/23) Goal status: INITIAL  2.  Patient will demonstrate ability to transition into tall-kneeling and/or quadruped positions with +1 assistance on mat table to work on functional strengthening Baseline:  Goal status: INITIAL  3.  Pt will perform squat pivot transfer w/ no more than modA in order to improve access to home environment for toilet transfers. Baseline: minA for lateral scoot transfer, pt able to clear bottom intermittently; min A for "bump" transfer with assist just needed for LE management (7/2), min A to SBA for SB transfer (7/23) Goal status: IN PROGRESS    ASSESSMENT:  CLINICAL IMPRESSION: Ongoing work on standing tolerance and improved trunk and hip control without additional outside support such as stander.  She continues to do well with this task and is appropriate for attempted progression to left stand pivot tasks.  Likely would need a +2 for safety on initial attempts due to reliance on hyperextension of left knee for maximum stability.  Will continue to address this per POC.   OBJECTIVE IMPAIRMENTS: decreased balance, decreased coordination, decreased mobility, difficulty walking, decreased ROM, decreased strength,  hypomobility, increased edema, impaired flexibility, impaired sensation, impaired tone, impaired UE functional use, improper body mechanics, postural dysfunction, and pain.   ACTIVITY  LIMITATIONS: carrying, lifting, bending, sitting, standing, squatting, stairs, transfers, bed mobility, continence, bathing, toileting, dressing, reach over head, hygiene/grooming, locomotion level, and caring for others  PARTICIPATION LIMITATIONS: meal prep, cleaning, laundry, driving, and community activity  PERSONAL FACTORS: Age, Fitness, Past/current experiences, Time since onset of injury/illness/exacerbation, and 1-2 comorbidities: intermittent AD, neurogenic bowel/bladder on active bladder training  are also affecting patient's functional outcome.   REHAB POTENTIAL: Good  CLINICAL DECISION MAKING: Evolving/moderate complexity  EVALUATION COMPLEXITY: Moderate  PLAN:  PT FREQUENCY: 2x/week  PT DURATION: 8 weeks + 4 weeks (recert) + 4 weeks (recert)  PLANNED INTERVENTIONS: Therapeutic exercises, Therapeutic activity, Neuromuscular re-education, Balance training, Patient/Family education, Self Care, DME instructions, Electrical stimulation, Wheelchair mobility training, Manual therapy, and Re-evaluation  PLAN FOR NEXT SESSION:  Least restrictive standing--can progress to stedy vs in // bars if skilled +2 available with knee immobilizers OR +2 from EOM via 3 musketeers, use of powderboard on mat table for RLE strengthening, use of leg loops/lifters to increase independence with LE management with transfers and bed mobility if pt is willing to use AE, standing in // bars with knees blocked or knee immobilizers to assess standing without stedy, bumping uphill, placing SB while seated in PWC, if +2 available could work on prone over peanut ball or tall-kneeling with bench on mat table, can she hold quadruped?    Camille Bal, PT, DPT  01/24/2023, 6:19 PM

## 2023-01-25 ENCOUNTER — Ambulatory Visit: Payer: Medicare PPO | Admitting: Physical Therapy

## 2023-01-25 ENCOUNTER — Encounter: Payer: Medicare PPO | Admitting: Occupational Therapy

## 2023-01-26 ENCOUNTER — Ambulatory Visit: Payer: Medicare PPO | Admitting: Physical Therapy

## 2023-01-26 ENCOUNTER — Ambulatory Visit: Payer: Medicare PPO | Admitting: Occupational Therapy

## 2023-01-27 ENCOUNTER — Encounter: Payer: Medicare PPO | Admitting: Occupational Therapy

## 2023-01-27 ENCOUNTER — Ambulatory Visit: Payer: Medicare PPO | Admitting: Physical Therapy

## 2023-01-27 DIAGNOSIS — Z435 Encounter for attention to cystostomy: Secondary | ICD-10-CM | POA: Diagnosis not present

## 2023-01-27 DIAGNOSIS — G8250 Quadriplegia, unspecified: Secondary | ICD-10-CM | POA: Diagnosis not present

## 2023-01-27 DIAGNOSIS — Z1330 Encounter for screening examination for mental health and behavioral disorders, unspecified: Secondary | ICD-10-CM | POA: Diagnosis not present

## 2023-01-27 DIAGNOSIS — N319 Neuromuscular dysfunction of bladder, unspecified: Secondary | ICD-10-CM | POA: Diagnosis not present

## 2023-01-30 ENCOUNTER — Encounter: Payer: Self-pay | Admitting: Internal Medicine

## 2023-01-31 ENCOUNTER — Encounter: Payer: Medicare PPO | Admitting: Occupational Therapy

## 2023-01-31 ENCOUNTER — Ambulatory Visit: Payer: Medicare PPO | Admitting: Physical Therapy

## 2023-01-31 MED ORDER — ATORVASTATIN CALCIUM 20 MG PO TABS: 20.0000 mg | ORAL_TABLET | Freq: Every day | ORAL | 1 refills | Status: DC

## 2023-01-31 MED ORDER — FESOTERODINE FUMARATE ER 8 MG PO TB24: 8.0000 mg | ORAL_TABLET | Freq: Every day | ORAL | 1 refills | Status: DC

## 2023-02-01 ENCOUNTER — Encounter: Payer: Self-pay | Admitting: Physical Therapy

## 2023-02-01 ENCOUNTER — Ambulatory Visit: Payer: Medicare PPO | Admitting: Occupational Therapy

## 2023-02-01 ENCOUNTER — Ambulatory Visit: Payer: Medicare PPO | Admitting: Physical Therapy

## 2023-02-01 DIAGNOSIS — M6281 Muscle weakness (generalized): Secondary | ICD-10-CM | POA: Diagnosis not present

## 2023-02-01 DIAGNOSIS — R29818 Other symptoms and signs involving the nervous system: Secondary | ICD-10-CM | POA: Diagnosis not present

## 2023-02-01 DIAGNOSIS — R293 Abnormal posture: Secondary | ICD-10-CM

## 2023-02-01 DIAGNOSIS — R2689 Other abnormalities of gait and mobility: Secondary | ICD-10-CM

## 2023-02-01 DIAGNOSIS — R278 Other lack of coordination: Secondary | ICD-10-CM

## 2023-02-01 DIAGNOSIS — G8254 Quadriplegia, C5-C7 incomplete: Secondary | ICD-10-CM

## 2023-02-01 NOTE — Therapy (Signed)
OUTPATIENT PHYSICAL THERAPY NEURO TREATMENT   Patient Name: [name] MRN: [id number] DOB:24-Mar-1951, 72 y.o., female Today's Date: 02/01/2023   PCP: [name], MD REFERRING PROVIDER: [name], MD   END OF SESSION:  PT End of Session - 02/01/23 1145     Visit Number 27    Number of Visits 32   recert   Date for PT Re-Evaluation 02/04/23   to allow for scheduling delays   Authorization Type [employer]IC[employer]    Progress Note Due on Visit 30    PT Start Time 1145    PT Stop Time 1230    PT Time Calculation (min) 45 min    Equipment Utilized During Treatment Gait belt    Activity Tolerance Patient tolerated treatment well    Behavior During Therapy [facility] for tasks assessed/performed                      Past Medical History:  Diagnosis Date   CERVICAL POLYP 03/11/2008   Qualifier: Diagnosis of  By: [name] MD, [name]    Colon polyps 2005   on colonscopy [name]   Fibroid 2004   Per Dr. [name]   History of shingles    face and mouth   Hx of skin cancer, basal cell    Rosacea    Sciatica of left side 09/28/2013   Scoliosis    noted on mri done for back pain   Past Surgical History:  Procedure Laterality Date   BUNIONECTOMY     Patient Active Problem List   Diagnosis Date Noted   Buttock wound, left, initial encounter 11/02/2022   Orthostatic hypotension 08/13/2022   Neurogenic bowel 05/03/2022   Spasticity 05/03/2022   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/03/2022   Nerve pain 05/03/2022   Medication monitoring encounter 01/08/2022   Neurogenic bladder 10/11/2021   Urinary incontinence 10/11/2021   ESBL (extended spectrum beta-lactamase) producing bacteria infection 10/09/2021   Recurrent UTI 10/09/2021   Quadriplegia, C5-C7 incomplete (HCC) 01/16/2021   History of spinal fracture 01/16/2021   Suprapubic catheter (HCC) 01/16/2021   Encounter for routine gynecological examination 09/28/2013   Onychomycosis 09/28/2013   Foot deformity,  acquired 03/26/2012   Encounter for preventive health examination 12/25/2010   ROSACEA 08/25/2009   Disturbance in sleep behavior 03/11/2008   SKIN CANCER, HX OF 03/11/2008   DYSURIA, HX OF 03/11/2008   Hyperlipidemia 02/10/2007   CERVICALGIA 02/10/2007    ONSET DATE: 09/28/2022 (most recent referral)  REFERRING DIAG: G82.54 (ICD-10-CM) - Quadriplegia, C5-C7, incomplete (HCC)  THERAPY DIAG:  Abnormal posture  Muscle weakness (generalized)  Quadriplegia, C5-C7 incomplete (HCC)  Other abnormalities of gait and mobility  Other lack of coordination  Other symptoms and signs involving the nervous system  Rationale for Evaluation and Treatment: Rehabilitation  SUBJECTIVE:  SUBJECTIVE STATEMENT: Pt is going to [facility] Saturday to begin her program on Monday.  She will know how long she will be there after her first full week.  She has a red place on left ankle, not open, wrapped in gauze out of abundance of precaution and has left compression stockings off due to this.  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 Yes: NPRS scale: 3/10 Pain location: forearms to fingertips Pain description: constant, pinprick/tingling Aggravating factors: nighttime Relieving factors: nothing, sometimes medicines  PRECAUTIONS: Fall; suprapubic catheter (she wants to get this removed meaning she needs to get to and from the toilet); she has had minor heat sensation when needing to complete her bowel program-possible AD?  WEIGHT BEARING RESTRICTIONS: No-pt was in stander at most recent therapy in [place] last week.  She will have a bone density done soon w/ Dr. [name].  FALLS:  Has patient fallen in last 6 months? No  LIVING ENVIRONMENT: Lives with: lives with their spouse and live-in nurse [name] Lives in: House/apartment-townhouse Stairs: No-level entry, but multi-level home 4 stories w/ elevator Has following equipment at home: Wheelchair (power), Wheelchair (manual), Grab bars, and standing frame, sliding board, transport shower chair, handheld shower head, leg strap-pt reports she no longer finds this helpful  PLOF: Requires assistive device for independence, Needs assistance with ADLs, Needs assistance with homemaking, and Needs assistance with transfers  OCCUPATION:  Writer-uses Dragon to dictate  PATIENT GOALS: "Make my right leg work."  Stand and pivot so she can more easily access a toilet.  OBJECTIVE:   DIAGNOSTIC FINDINGS: No recent relevant imaging.  Bone density scheduled 10/15/2022.  COGNITION: Overall cognitive status: Within functional limits for tasks assessed   SENSATION: Light touch: Diminished ability to distinguish sharp and dull, but able to distinguish light touch from injury level down accurately  COORDINATION: Not formally assessed.  EDEMA:  Well managed w/ lymphatic massage and compression stockings.  MUSCLE TONE: Pt has intermittent clonus during transfers.  POSTURE: rounded shoulders, posterior pelvic tilt, and right thoracic scoliosis   LOWER EXTREMITY ROM:     Passive  Right 10/20/22 Left 10/20/22  Hip flexion [facility]  Hip extension    Hip abduction    Hip adduction    Hip internal rotation [facility] WFL  Hip external rotation [facility]  Knee flexion [facility] WFL  Knee extension [facility] WFL  Ankle dorsiflexion Slight PF contracture Slight PF contracture  Ankle plantarflexion    Ankle inversion    Ankle eversion     (Blank rows = not tested)    LOWER EXTREMITY MMT:    MMT Right 10/20/22 Left 10/20/22  Hip flexion 1 2+  Hip extension    Hip abduction    Hip adduction    Hip internal rotation    Hip external rotation     Knee flexion 2- 3  Knee extension 2- 3  Ankle dorsiflexion 0 3  Ankle plantarflexion    Ankle inversion    Ankle eversion     (Blank rows = not tested)   BED MOBILITY:  Sit to supine Mod A Supine to sit Mod A Rolling to Right Mod A Rolling to Left Mod A Undulating mattress for wound management on standard bed (elevated-so often doing uphill sliding board transfers); she would like to continue working on sitting up independently, she has been  working on rolling, needs less assistance w/ this when someone props her leg into hooklying; would like something to help her pull her left leg to her butt for stretching as well as bed mobility.  FUNCTIONAL TESTS:  None relevant to pt's current functional level and abilities.  PATIENT SURVEYS:  None completed due to time.  TODAY'S TREATMENT:        -Lateral scoot transfer PWC to mat table to left with min A for BLE management/foot placement.  From elevated mat table patient performs STS using PT shoulders for stability and left knee hyperextension in standing "locked" position.  PT blocks R knee to prevent collapse and provides min-modA into stand and minA to remain standing just over 3 minutes.  During sitting, pt has more right lateral collapse resulting in being crooked on mat.  Time spent adjusting posture at pelvis and with BLE using scoot method to adjust posture prior to second stand.  During second stand of just over 6 minutes, PT adjust pelvic posture to maintain COM b/w BLE.  Performed 2 rounds of alternating UE lateral raises, good core and hip contraction noted to maintain trunk control with unilateral UE support.  Progressed to lateral and anterior-posterior weight shifts to challenge trunk control and progress LE stability and approximation.  Better return to sit from second stand with improved symmetry.  Mat lowered between reps to allow for rest in 90-90 LE position to unweight legs.  -Right stand pivot back to PWC using +2 at end of  session with second person providing Total A for RLE adjustment mid pivot and first therapist providing modA to support hips during swivel (unable to safety use therapists LE to adjust patient LE as pt relies on UE support on therapist shoulders in sitting for maintained trunk control).   PATIENT EDUCATION: Education details: Discussion of transfer of tasks practiced today to functional tasks in future like stand pivot to access bathroom toilet as pt plans to try voiding on standard commode on return from [place]. Person educated: Patient and Arts administrator Education method: Explanation Education comprehension: verbalized understanding  HOME EXERCISE PROGRAM: Will be established as needed as pt has done continuous therapy and is working towards functional tasks.  GOALS: Goals reviewed with patient? Yes   NEW NEW SHORT TERM GOALS=LONG TERM GOALS:  due to length of POC   NEW NEW LONG TERM GOALS:  Target date: 02/04/2023  Patient will be able to tolerate standing x 5 min via least restrictive method to demonstrate increased LE strength and upright tolerance Baseline: 1 min in stedy (7/23) Goal status: INITIAL  2.  Patient will demonstrate ability to transition into tall-kneeling and/or quadruped positions with +1 assistance on mat table to work on functional strengthening Baseline:  Goal status: INITIAL  3.  Pt will perform squat pivot transfer w/ no more than modA in order to improve access to home environment for toilet transfers. Baseline: minA for lateral scoot transfer, pt able to clear bottom intermittently; min A for "bump" transfer with assist just needed for LE management (7/2), min A to SBA for SB transfer (7/23) Goal status: IN PROGRESS    ASSESSMENT:  CLINICAL IMPRESSION: Excellent session today as pt was able to decrease UE support in standing with therapist providing modA for right knee protection and hip extension.  She progressed to a right stand pivot using +2  assistance today with second therapist serving to adjust right foot positioning for safety.  This likely could be done with one person  in the future, but may take some additional trials adjusting foot placement prior to transfer so as to not sacrifice pt independence with standing portion but improve pivot portion.  Will plan to assess goals and discharge patient at next session as she is set to attend [facility] day program for unknown length of time starting next week.  OBJECTIVE IMPAIRMENTS: decreased balance, decreased coordination, decreased mobility, difficulty walking, decreased ROM, decreased strength, hypomobility, increased edema, impaired flexibility, impaired sensation, impaired tone, impaired UE functional use, improper body mechanics, postural dysfunction, and pain.   ACTIVITY LIMITATIONS: carrying, lifting, bending, sitting, standing, squatting, stairs, transfers, bed mobility, continence, bathing, toileting, dressing, reach over head, hygiene/grooming, locomotion level, and caring for others  PARTICIPATION LIMITATIONS: meal prep, cleaning, laundry, driving, and community activity  PERSONAL FACTORS: Age, Fitness, Past/current experiences, Time since onset of injury/illness/exacerbation, and 1-2 comorbidities: intermittent AD, neurogenic bowel/bladder on active bladder training  are also affecting patient's functional outcome.   REHAB POTENTIAL: Good  CLINICAL DECISION MAKING: Evolving/moderate complexity  EVALUATION COMPLEXITY: Moderate  PLAN:  PT FREQUENCY: 2x/week  PT DURATION: 8 weeks + 4 weeks (recert) + 4 weeks (recert)  PLANNED INTERVENTIONS: Therapeutic exercises, Therapeutic activity, Neuromuscular re-education, Balance training, Patient/Family education, Self Care, DME instructions, Electrical stimulation, Wheelchair mobility training, Manual therapy, and Re-evaluation  PLAN FOR NEXT SESSION:  ASSESS LTGs - discharge!  Least restrictive standing +2 from EOM via 3  musketeers (could she stand to a walker setup of some kind or hold onto grab bar or parallel bar if right knee blocked?), use of powderboard on mat table for RLE strengthening, use of leg loops/lifters to increase independence with LE management with transfers and bed mobility if pt is willing to use AE, bumping uphill, placing SB while seated in PWC, if +2 available could work on prone over peanut ball or tall-kneeling with bench on mat table, can she hold quadruped?    Camille Bal, PT, DPT  02/01/2023, 12:37 PM

## 2023-02-01 NOTE — Therapy (Signed)
OUTPATIENT OCCUPATIONAL THERAPY NEURO TREATMENT & DISCHARGE NOTE  Patient Name: [name] MRN: [id number] DOB:Apr 07, 1951, 72 y.o., female Today's Date: 02/01/2023  PCP: [name], MD  REFERRING PROVIDER: [name], MD  END OF SESSION:  OT End of Session - 02/01/23 1218     Visit Number 26    Number of Visits 36    Date for OT Re-Evaluation 02/18/23    Authorization Type [employer] - requires auth, [place] (additional auth requested)    Progress Note Due on Visit 36    OT Start Time 1234    OT Stop Time 1315    OT Time Calculation (min) 41 min    Equipment Utilized During Treatment power WC, computer    Activity Tolerance Patient tolerated treatment well    Behavior During Therapy [facility] for tasks assessed/performed               Past Medical History:  Diagnosis Date   CERVICAL POLYP 03/11/2008   Qualifier: Diagnosis of  By: [name] MD, [name]    Colon polyps 2005   on colonscopy [name]   Fibroid 2004   Per Dr. [name]   History of shingles    face and mouth   Hx of skin cancer, basal cell    Rosacea    Sciatica of left side 09/28/2013   Scoliosis    noted on mri done for back pain   Past Surgical History:  Procedure Laterality Date   BUNIONECTOMY     Patient Active Problem List   Diagnosis Date Noted   Buttock wound, left, initial encounter 11/02/2022   Orthostatic hypotension 08/13/2022   Neurogenic bowel 05/03/2022   Spasticity 05/03/2022   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/03/2022   Nerve pain 05/03/2022   Medication monitoring encounter 01/08/2022   Neurogenic bladder 10/11/2021   Urinary incontinence 10/11/2021   ESBL (extended spectrum beta-lactamase) producing bacteria infection 10/09/2021   Recurrent UTI 10/09/2021   Quadriplegia, C5-C7 incomplete (HCC) 01/16/2021   History of spinal fracture 01/16/2021   Suprapubic catheter (HCC) 01/16/2021   Encounter for routine gynecological examination 09/28/2013   Onychomycosis 09/28/2013    Foot deformity, acquired 03/26/2012   Encounter for preventive health examination 12/25/2010   ROSACEA 08/25/2009   Disturbance in sleep behavior 03/11/2008   SKIN CANCER, HX OF 03/11/2008   DYSURIA, HX OF 03/11/2008   Hyperlipidemia 02/10/2007   CERVICALGIA 02/10/2007    ONSET DATE: 07/28/2020  Date of Referral 09/28/22  REFERRING DIAG: G82.54 (ICD-10-CM) - Quadriplegia, C5-C7, incomplete (HCC)  THERAPY DIAG:  Other lack of coordination  Muscle weakness (generalized)  Other symptoms and signs involving the nervous system  Rationale for Evaluation and Treatment: Rehabilitation  SUBJECTIVE:   SUBJECTIVE STATEMENT:   Patient reports she is going to [place] this weekend to start with evals and daily treatment starting next Monday morning.  She will be staying on campus and getting daily OT/PT and has reached out to them about a possible new estim treatment for UE use.  Pt accompanied by:  Live in Caregiver - [name]   PERTINENT HISTORY: "Pt is a 72 yr old L handed female with hx of incomplete quadriplegia- 2/14 2022- fleeing the police in [place] on passenger 100 (high speed) miles/hour,  [name]; neurogenic bowel and bladder and spasticity; [name], [name] Here for f/u on Incomplete quadriplegia"  Referral from MD 09/28/22 states, "Please eval and treat for ADLs and higher level mobility."  PRECAUTIONS: Fall; suprapubic catheter (  she wants to get this removed meaning she needs to get to and from the toilet); she has had minor heat sensation when needing to complete her bowel program-possible AD?   WEIGHT BEARING RESTRICTIONS: No-pt was in stander at most recent therapy in [place] last week.  She will have a bone density done soon w/ Dr. [name].   PAIN:  Patient reports chronic pain 3/10 fingers to elbow bilaterally managed by medication and will inform therapy staff of any changes.  FALLS: Has patient fallen in last 6 months? No  LIVING ENVIRONMENT: Lives  with: lives with their family - husband [name] and with an adult companion s/p moving back up from [place] x10 months Lives in: House/apartment Stairs:  4 story town house with an Engineer, structural with threshold adjustments, roll in shower with transport chair Has following equipment at home: Wheelchair (power) - with seat height adjustments to access counters and reclining option, Wheelchair (manual), transport WC, shower chair, and Ramped entry, handheld showerhead with rails around toilet, had Michiel Sites but is no longer in need of it, has slide boards x3  PLOF: Requires assistive device for independence, Needs assistance with ADLs, Needs assistance with homemaking, Needs assistance with gait, and Needs assistance with transfers; 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  Used to like to knit, sew and bake.  PATIENT GOALS: Wants to be able to type - currently using advanced Dragon dictation at times but prefers to type at times.  She would like to be able to write better and is interested in resuming some leisure activities such as Archivist and baking.  She also wants to keep working on being able to cut her own food and on her UE strength.   OBJECTIVE:   HAND DOMINANCE: Left  ADLs: Overall ADLs: Patient has a live in caregiver  Transfers/ambulation related to ADLs: Mod assist with sliding board transfers (previously [employer] lift).  Eating: Has a rocker knife that she can use. Used to use adapted utensils but now uses regular utensils but still will get assistance to cut food ie) when eating out.  Grooming: can brush her own hair but unable to manage jewelry ie) earrings  UB Dressing: can zip/unzip after it has been started, unable to manage buttons herself, Caregiver assists but if she has extra time, she can put on her bra, and a loose fitting pullover shirt/t-shirt  LB Dressing: dependent for LB dressing in bed and with special sock donner for LE compression garments   Toileting: bladder  trained with suprapubic catheter which she clamps off.  Dependent for bowel incontinence care.  Bathing: Sponge bath with adult washclothes.  Can bath UB with back scrubber for most of her back.  Needs help with feet (mentioned she might need a separate brush for feet)   Tub Shower transfers: Min-mod assist with slide board  Equipment: Shower seat with back, Walk in shower, bed side commode, Reacher, Sock aid, Long handled sponge, and Feeding equipment  IADLs: --  Shopping: Assisted by caregiver  Light housekeeping: Has housekeeper that comes monthly  Meal Prep: previously enjoyed baking. Assisted by caregiver but described recent success at reheating a meal for herself after getting food out of the fridge/freezer from her WC.  Community mobility: Dependent  Medication management: Caregiver sorts them into pillbox but she is very aware of her medications   Financial management: Patient manages her own finances  Handwriting: Increased time and has a pen with a little grip  MOBILITY STATUS: Independent  with power mobiity  POSTURE COMMENTS:  No Significant postural limitations and forward head Sitting balance: Supports self independently with both Ues  ACTIVITY TOLERANCE: Activity tolerance: Fair - MMT WFL but has limited sustained tolerance for ongoing use of UEs  FUNCTIONAL OUTCOME MEASURES: 10/20/22 QuickDash 31.8 points   UPPER EXTREMITY ROM:   AROM - WFL without obvious contractures, some digital flexion noted but PROM WNL   AROM Right (eval) Left (eval)  Shoulder flexion [facility]  Shoulder abduction [facility]  Elbow flexion [facility] WFL  Elbow extension [facility]  Wrist flexion [facility] WFL  Wrist extension WFL WFL  Ulnar deviation WFL Decreased ulnar  deviation past midline  Wrist pronation [facility] WFL  Wrist supination [facility]  Digit Composite Flexion Lacks full AROM:   1st digit - 5 cm   3rd digit -1 cm   4th digit - 2 cm  Lacks full AROM:   1st digit - 1.5 cm  5th digit  - 3 cm   Digit Composite Extension [facility]  Digit Opposition Opposition to index finger only Lacks to pinkie due to limited DIP pinkie flexion  (Blank rows = not tested)  UPPER EXTREMITY MMT:   Grossly WFL - Endurance limited R tricep strength > than L but L UE generally stronger than R UE  MMT Right (eval) Left (eval)  Shoulder flexion 4/5 4/5  Shoulder abduction 4/5 4/5  Elbow flexion 4/5 4/5  Elbow extension 4/5 4-/5  (Blank rows = not tested)  HAND FUNCTION: Grip strength: Right: 4.8 lbs; Left: 20.9 lbs 11/17/22 Right - 7.2 lbs  COORDINATION: Finger Nose Finger test: R generally WFL, L WNL Box and Blocks:  Right 28 blocks, Left 37 blocks R hand finger eventually cramps and dexterity worsens in the cold  SENSATION: Light touch: Impaired  - patient   EDEMA: NA for UEs but LE has poor lymph drainage with custom compression garments   MUSCLE TONE: Generally WFL but will assess further  COGNITION: Overall cognitive status: Within functional limits for tasks assessed  VISION: Subjective report: Patent wears progressive lens/glasses.  Denies diplopia or vision changes but has eye exam in the next couple of months. Baseline vision: Wears glasses all the time  VISION ASSESSMENT: WFL  EVALUATION OBSERVATIONS: Patient independent with power WC navigation within clinic.  Patient is well-kept with foley catheter in place.  She has slight limitations in full extension of digits but PROM is WNL and she has splints at home that she said she will bring for OT staff to assess.  She has functional ROM of B UEs to reach her head, behind her back and to cross midline to assist with ADLs.     TODAY'S TREATMENT:                                                                                                                           - Therapeutic Activities  Typing speed/dexterity assessed with her facility keyboard and  online program to assess compared to previous assessment with at least  1WPM improvement to 17WPM from 16WPM several weeks ago.  Patient encouraged to conduct typing test on her personal keyboard also as she feels as she needs to push down extra hard on the keys of this keyboard.  No errors r/t double tapping letters today but still 2 errors noted. Reviewed goals for discharge noted today with good improvements in areas including pressure relief with good wound healing, leisure activities, and comprehensive exercises program.  She is not baking or consistently using an adaptive knife. PATIENT EDUCATION: Education details: Discharge instructions person educated: Patient and Caregiver Live in caregiver - [name] Education method: Explanation and Verbal cues,  Education comprehension: verbalized understanding   HOME EXERCISE PROGRAM: 11/25/2022: NMES use  12/02/22: All previous HEPs combined to 1 complete List through Med[employer] Access Code: [email or web address] URL: [email or web address] Date: 12/02/2022 Prepared by: Amada Kingfisher   GOALS:   SHORT TERM GOALS:   Patient will be able to use AE/modified techniques to cut soft foods small enough for oral intake. Baseline: Caregiver/spouse assist Goal status: MET  3.  Patient will verbalize understanding of good pressure relief schedule (for 15 to 60 seconds every 15 to 60 minutes) to help with wound healing. Baseline: Patient did not change positioning in > 45 minutes of OT eval. Goal status: MET  4.  Patient will be assisted to explore modifications for leisure tasks (knit/sew/bake) with good return demonstration. Baseline: Minimal involvement Goal status: MET  11/17/22 - trialled knitting  12/02/22 & 12/20/22 - continued AE exploration 01/03/23 Baking is incomplete (see long term goal)  5.  Patient will demonstrate independence with HEP for UE strengthening, coordination and ROM to prevent contractures and maintain strength for transfers and ADLs. Baseline: Previous HEPs have been established but need to be  reviewed and updated.  Goal status: MET  11/17/22 working on lists and categorizing activities 12/02/22 Comprehensive [employer] list compiled  6.  Patient will be assessed for typing speed/dexterity. Baseline: Patient reports difficulty with typing with all her fingers. Goal status: REVISED - see long term goals   LONG TERM GOALS: Target date: 02/18/23  Patient will complete 1 small craft/baking etc week r/t her leisure interests to work on [employer] daily. Baseline: Minimal involvement Goal status: MET 02/01/23 - Patient is participating in knitting weekly  2.  Patient will improve B coordination for increased typing speed/dexterity x 1-2 WPM. Baseline: formal assessment needed Goal status: MET  11/17/22 daily engagement with typing at home although modified use of digits for speed 02/01/23 - 16 WPM with 2 errors   3.  Patient will improve B UE coordination, strength and functional use to bake cookies with setup assistance and AE/modified techniques as appropriate.  Baseline: Not performed. Goal status: NOT MET 02/01/23 - Patient has moved 'baking' down her list of priorities.  4.  Patient will report no more than moderate difficulty using a knife to foods such as chicken, small enough for oral intake using AE and strategies as needed. Baseline: Caregiver/spouse assist Goal status: NOT MET 02/01/23 - Continues to get assistance from caregivers  5.  Patient will be able to use AE/modified positioning to complete handwritten list 100% legibility. Baseline: Subjective reports of difficulty with writing Goal status: MET 02/01/23 - Patient regularly writes lists, signs checks etc   ASSESSMENT:  CLINICAL IMPRESSION:   Pt benefited from outpatient OT services to address UE coordination deficits, adaptive equipment recommendations, modified technique considerations and problem solving strategies to  improve participation in daily activities related to ADLs and IADLs. Pt is being discharged from  skilled OT at this time and will benefit from skilled OT services in the inpatient setting at [facility], to work on UE impairments and provide progression of HEP ideas, explore AE and to help pt return to PLOF as able.    PERFORMANCE DEFICITS: in functional skills including ADLs, IADLs, coordination, dexterity, strength, muscle spasms, Fine motor control, Gross motor control, continence, skin integrity, and UE functional use,   IMPAIRMENTS: are limiting patient from ADLs, IADLs, work, and leisure.   CO-MORBIDITIES: has co-morbidities such as incontinence and wound  that affects occupational performance. Patient will benefit from skilled OT to address above impairments and improve overall function.  REHAB POTENTIAL: Fair due to chronicity of injury   PLAN:  OCCUPATIONAL THERAPY DISCHARGE SUMMARY  Visits from Start of Care: 26 including evaluation  Current functional level related to goals / functional outcomes: Pt has met or progressed in all goals to satisfactory levels and is pleased with outcomes.  She is scheduled to begin more intensive inpatient Rehab at the [facility] next week.  She has met 5/5 STGS and 3/5 LTGs.  Remaining deficits: Pt has ongoing functional deficits as a result of quadriparesis but has the opportunity for an inpatient rehab stay to address these deficits in a comprehensive therapy setting.  Education / Equipment: Pt has all needed materials and education. Pt understands how to continue on with self-management. See tx notes for more details.   Patient agrees to discharge due to max benefits received from outpatient occupational therapy / hand therapy at this time.   Victorino Sparrow, OT 02/01/2023, 1:18 PM

## 2023-02-03 ENCOUNTER — Encounter: Payer: Medicare PPO | Admitting: Occupational Therapy

## 2023-02-03 ENCOUNTER — Ambulatory Visit: Payer: Medicare PPO | Admitting: Physical Therapy

## 2023-02-03 DIAGNOSIS — R293 Abnormal posture: Secondary | ICD-10-CM | POA: Diagnosis not present

## 2023-02-03 DIAGNOSIS — M6281 Muscle weakness (generalized): Secondary | ICD-10-CM | POA: Diagnosis not present

## 2023-02-03 DIAGNOSIS — R2689 Other abnormalities of gait and mobility: Secondary | ICD-10-CM | POA: Diagnosis not present

## 2023-02-03 DIAGNOSIS — R278 Other lack of coordination: Secondary | ICD-10-CM | POA: Diagnosis not present

## 2023-02-03 DIAGNOSIS — G8254 Quadriplegia, C5-C7 incomplete: Secondary | ICD-10-CM

## 2023-02-03 DIAGNOSIS — R29818 Other symptoms and signs involving the nervous system: Secondary | ICD-10-CM | POA: Diagnosis not present

## 2023-02-03 NOTE — Therapy (Signed)
OUTPATIENT PHYSICAL THERAPY NEURO TREATMENT - DISCHARGE NOTE   Patient Name: [name] MRN: [id number] DOB:December 17, 1950, 72 y.o., female Today's Date: 02/03/2023   PCP: [name], MD REFERRING PROVIDER: [name], MD   PHYSICAL THERAPY DISCHARGE SUMMARY  Visits from Start of Care: 49  Current functional level related to goals / functional outcomes: Mod I at [facility] level, min A for squat pivot or SB transfers   Remaining deficits: Decreased BUE and BLE strength and control, impaired core control   Education / Equipment: Continue with HEP as previously provided   Patient agrees to discharge. Patient goals were partially met. Patient is being discharged due to  plan to transition to day program at [facility].    END OF SESSION:  PT End of Session - 02/03/23 1532     Visit Number 28    Number of Visits 32   recert   Date for PT Re-Evaluation 02/04/23   to allow for scheduling delays   Authorization Type [employer] MEDICARE    Progress Note Due on Visit 30    PT Start Time 1530    PT Stop Time 1614    PT Time Calculation (min) 44 min    Equipment Utilized During Treatment Gait belt    Activity Tolerance Patient tolerated treatment well    Behavior During Therapy WFL for tasks assessed/performed                       Past Medical History:  Diagnosis Date   CERVICAL POLYP 03/11/2008   Qualifier: Diagnosis of  By: [name] MD, [name]    Colon polyps 2005   on colonscopy [name]   Fibroid 2004   Per Dr. [name]   History of shingles    face and mouth   Hx of skin cancer, basal cell    Rosacea    Sciatica of left side 09/28/2013   Scoliosis    noted on mri done for back pain   Past Surgical History:  Procedure Laterality Date   BUNIONECTOMY     Patient Active Problem List   Diagnosis Date Noted   Buttock wound, left, initial encounter 11/02/2022   Orthostatic hypotension 08/13/2022   Neurogenic bowel 05/03/2022   Spasticity  05/03/2022   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/03/2022   Nerve pain 05/03/2022   Medication monitoring encounter 01/08/2022   Neurogenic bladder 10/11/2021   Urinary incontinence 10/11/2021   ESBL (extended spectrum beta-lactamase) producing bacteria infection 10/09/2021   Recurrent UTI 10/09/2021   Quadriplegia, C5-C7 incomplete (HCC) 01/16/2021   History of spinal fracture 01/16/2021   Suprapubic catheter (HCC) 01/16/2021   Encounter for routine gynecological examination 09/28/2013   Onychomycosis 09/28/2013   Foot deformity, acquired 03/26/2012   Encounter for preventive health examination 12/25/2010   ROSACEA 08/25/2009   Disturbance in sleep behavior 03/11/2008   SKIN CANCER, HX OF 03/11/2008   DYSURIA, HX OF 03/11/2008   Hyperlipidemia 02/10/2007   CERVICALGIA 02/10/2007    ONSET DATE: 09/28/2022 (most recent referral)  REFERRING DIAG: G82.54 (ICD-10-CM) - Quadriplegia, C5-C7, incomplete (HCC)  THERAPY DIAG:  Other lack of coordination  Muscle weakness (generalized)  Quadriplegia, C5-C7 incomplete (HCC)  Rationale for Evaluation and Treatment: Rehabilitation  SUBJECTIVE:  SUBJECTIVE STATEMENT: Pt reports no significant changes since last visit. Pt plans to start at [facility] next week.  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 Yes: NPRS scale: 3/10 Pain location: forearms to fingertips Pain description: constant, pinprick/tingling Aggravating factors: nighttime Relieving factors: nothing, sometimes medicines  PRECAUTIONS: Fall; suprapubic catheter (she wants to get this removed meaning she needs to get to and from the toilet); she has had  minor heat sensation when needing to complete her bowel program-possible AD?  WEIGHT BEARING RESTRICTIONS: No-pt was in stander at most recent therapy in [place] last week.  She will have a bone density done soon w/ Dr. [name].  FALLS: Has patient fallen in last 6 months? No  LIVING ENVIRONMENT: Lives with: lives with their spouse and live-in nurse [name] Lives in: House/apartment-townhouse Stairs: No-level entry, but multi-level home 4 stories w/ elevator Has following equipment at home: Wheelchair (power), Wheelchair (manual), Grab bars, and standing frame, sliding board, transport shower chair, handheld shower head, leg strap-pt reports she no longer finds this helpful  PLOF: Requires assistive device for independence, Needs assistance with ADLs, Needs assistance with homemaking, and Needs assistance with transfers  OCCUPATION:  Writer-uses Dragon to dictate  PATIENT GOALS: "Make my right leg work."  Stand and pivot so she can more easily access a toilet.  OBJECTIVE:   DIAGNOSTIC FINDINGS: No recent relevant imaging.  Bone density scheduled 10/15/2022.  COGNITION: Overall cognitive status: Within functional limits for tasks assessed   SENSATION: Light touch: Diminished ability to distinguish sharp and dull, but able to distinguish light touch from injury level down accurately  COORDINATION: Not formally assessed.  EDEMA:  Well managed w/ lymphatic massage and compression stockings.  MUSCLE TONE: Pt has intermittent clonus during transfers.  POSTURE: rounded shoulders, posterior pelvic tilt, and right thoracic scoliosis   LOWER EXTREMITY ROM:     Passive  Right 10/20/22 Left 10/20/22  Hip flexion [facility]  Hip extension    Hip abduction    Hip adduction    Hip internal rotation [facility] WFL  Hip external rotation [facility]  Knee flexion [facility] WFL  Knee extension [facility] WFL  Ankle dorsiflexion Slight PF contracture Slight PF contracture  Ankle plantarflexion    Ankle inversion     Ankle eversion     (Blank rows = not tested)    LOWER EXTREMITY MMT:    MMT Right 10/20/22 Left 10/20/22  Hip flexion 1 2+  Hip extension    Hip abduction    Hip adduction    Hip internal rotation    Hip external rotation    Knee flexion 2- 3  Knee extension 2- 3  Ankle dorsiflexion 0 3  Ankle plantarflexion    Ankle inversion    Ankle eversion     (Blank rows = not tested)   BED MOBILITY:  Sit to supine Mod A Supine to sit Mod A Rolling to Right Mod A Rolling to Left Mod A Undulating mattress for wound management on standard bed (elevated-so often doing uphill sliding board transfers); she would like to continue working on sitting up independently, she has been working on rolling, needs less assistance w/ this when someone props her leg into hooklying; would like something to help her pull her left leg to her butt for stretching as well as bed mobility.  FUNCTIONAL TESTS:  None relevant to pt's current functional level and abilities.  PATIENT SURVEYS:  None completed due to time.  TODAY'S TREATMENT:        Pt received seated in her PWC. Bump transfer PWC to mat table with min A for RLE management. Sit to supine mod A for BLE management. Supine to prone with min A for placement of LLE. Pt does require some assistance positioning her BUE once in prone position. Session focus on transition from prone to quadruped to tall-kneeling. Trialed various devices under patient for support in quadruped position. Initially tried blue tumbleform and blue wedge placed backwards. Pt did best with use of blue wedge with higher portion of wedge under her hips. Pt able to utilize BUE strength to bring herself into quadruped position from prone on wedge with assist to block her feet from sliding and stabilization at her hips (assist x 2). Pt then able to transition to tall-kneeling position with use of therapy bench, assist x 3 needed. Pt able to maintain tall-kneeling position with just SBA for  about 1 min before onset of fatigue, able to rest draped over therapy bench. Pt exhibits good ability to maintain upright trunk in tall-kneeling position this date, can benefit from further practice of this functional position in future sessions. Pt returned to prone on mat then supine then sitting EOM. Bump transfer back to [facility] at end of session.   PATIENT EDUCATION: Education details: PT POC with plan to d/c this date for patient to transition to day program at [facility] Person educated: Patient and Caregiver [name] Education method: Explanation Education comprehension: verbalized understanding  HOME EXERCISE PROGRAM: Will be established as needed as pt has done continuous therapy and is working towards functional tasks.  GOALS: Goals reviewed with patient? Yes   NEW NEW SHORT TERM GOALS=LONG TERM GOALS:  due to length of POC   NEW NEW LONG TERM GOALS:  Target date: 02/04/2023  Patient will be able to tolerate standing x 5 min via least restrictive method to demonstrate increased LE strength and upright tolerance Baseline: 1 min in stedy (7/23), 6 min with +1 therapist assist (8/20) Goal status: MET  2.  Patient will demonstrate ability to transition into tall-kneeling and/or quadruped positions with +1 assistance on mat table to work on functional strengthening Baseline: +3 assist (8/22) Goal status: NOT MET  3.  Pt will perform squat pivot transfer w/ no more than modA in order to improve access to home environment for toilet transfers. Baseline: minA for lateral scoot transfer, pt able to clear bottom intermittently; min A for "bump" transfer with assist just needed for LE management (7/2), min A to SBA for SB transfer (7/23), +2 for stand pivot transfer (8/22) Goal status: MET    ASSESSMENT:  CLINICAL IMPRESSION: Emphasis of skilled PT session reassessing LTG in preparation for d/c from OPPT services this date as well as working on quadruped and tall-kneeling on mat  table. Pt met 2/3 LTG due to being able to tolerate standing x 6 min with no AD just assistance from one skilled therapist and being able to perform squat pivot transfers with min A. Pt did trial quadruped and tall-kneeling positions on mat table but required +3 assistance in order to get safely into these positions. Pt could benefit from further skilled therapy in the future to work on increased independence in these positions. Additionally, pt did also work towards stand pivot transfers and was able to perform this one time with assist x 2,  again could benefit from more practice with these types of transfers when she returns to this facility.  OBJECTIVE IMPAIRMENTS: decreased balance, decreased coordination, decreased mobility, difficulty walking, decreased ROM, decreased strength, hypomobility, increased edema, impaired flexibility, impaired sensation, impaired tone, impaired UE functional use, improper body mechanics, postural dysfunction, and pain.   ACTIVITY LIMITATIONS: carrying, lifting, bending, sitting, standing, squatting, stairs, transfers, bed mobility, continence, bathing, toileting, dressing, reach over head, hygiene/grooming, locomotion level, and caring for others  PARTICIPATION LIMITATIONS: meal prep, cleaning, laundry, driving, and community activity  PERSONAL FACTORS: Age, Fitness, Past/current experiences, Time since onset of injury/illness/exacerbation, and 1-2 comorbidities: intermittent AD, neurogenic bowel/bladder on active bladder training  are also affecting patient's functional outcome.   REHAB POTENTIAL: Good  CLINICAL DECISION MAKING: Evolving/moderate complexity  EVALUATION COMPLEXITY: Moderate     Peter Congo, PT, DPT, CSRS   02/03/2023, 4:15 PM

## 2023-02-07 ENCOUNTER — Encounter: Payer: Medicare PPO | Admitting: Occupational Therapy

## 2023-02-07 ENCOUNTER — Ambulatory Visit: Payer: Medicare PPO | Admitting: Physical Therapy

## 2023-02-07 DIAGNOSIS — G8254 Quadriplegia, C5-C7 incomplete: Secondary | ICD-10-CM | POA: Diagnosis not present

## 2023-02-07 DIAGNOSIS — R269 Unspecified abnormalities of gait and mobility: Secondary | ICD-10-CM | POA: Diagnosis not present

## 2023-02-07 DIAGNOSIS — I890 Lymphedema, not elsewhere classified: Secondary | ICD-10-CM | POA: Diagnosis not present

## 2023-02-07 DIAGNOSIS — R262 Difficulty in walking, not elsewhere classified: Secondary | ICD-10-CM | POA: Diagnosis not present

## 2023-02-07 DIAGNOSIS — R2689 Other abnormalities of gait and mobility: Secondary | ICD-10-CM | POA: Diagnosis not present

## 2023-02-07 DIAGNOSIS — R29898 Other symptoms and signs involving the musculoskeletal system: Secondary | ICD-10-CM | POA: Diagnosis not present

## 2023-02-07 DIAGNOSIS — R0989 Other specified symptoms and signs involving the circulatory and respiratory systems: Secondary | ICD-10-CM | POA: Diagnosis not present

## 2023-02-07 DIAGNOSIS — M6250 Muscle wasting and atrophy, not elsewhere classified, unspecified site: Secondary | ICD-10-CM | POA: Diagnosis not present

## 2023-02-07 DIAGNOSIS — R2991 Unspecified symptoms and signs involving the musculoskeletal system: Secondary | ICD-10-CM | POA: Diagnosis not present

## 2023-02-08 DIAGNOSIS — R29898 Other symptoms and signs involving the musculoskeletal system: Secondary | ICD-10-CM | POA: Diagnosis not present

## 2023-02-08 DIAGNOSIS — R262 Difficulty in walking, not elsewhere classified: Secondary | ICD-10-CM | POA: Diagnosis not present

## 2023-02-08 DIAGNOSIS — M6250 Muscle wasting and atrophy, not elsewhere classified, unspecified site: Secondary | ICD-10-CM | POA: Diagnosis not present

## 2023-02-08 DIAGNOSIS — R2991 Unspecified symptoms and signs involving the musculoskeletal system: Secondary | ICD-10-CM | POA: Diagnosis not present

## 2023-02-08 DIAGNOSIS — R269 Unspecified abnormalities of gait and mobility: Secondary | ICD-10-CM | POA: Diagnosis not present

## 2023-02-08 DIAGNOSIS — G8254 Quadriplegia, C5-C7 incomplete: Secondary | ICD-10-CM | POA: Diagnosis not present

## 2023-02-08 DIAGNOSIS — R2689 Other abnormalities of gait and mobility: Secondary | ICD-10-CM | POA: Diagnosis not present

## 2023-02-08 DIAGNOSIS — I890 Lymphedema, not elsewhere classified: Secondary | ICD-10-CM | POA: Diagnosis not present

## 2023-02-08 DIAGNOSIS — R0989 Other specified symptoms and signs involving the circulatory and respiratory systems: Secondary | ICD-10-CM | POA: Diagnosis not present

## 2023-02-09 DIAGNOSIS — M6250 Muscle wasting and atrophy, not elsewhere classified, unspecified site: Secondary | ICD-10-CM | POA: Diagnosis not present

## 2023-02-09 DIAGNOSIS — R2991 Unspecified symptoms and signs involving the musculoskeletal system: Secondary | ICD-10-CM | POA: Diagnosis not present

## 2023-02-09 DIAGNOSIS — R0989 Other specified symptoms and signs involving the circulatory and respiratory systems: Secondary | ICD-10-CM | POA: Diagnosis not present

## 2023-02-09 DIAGNOSIS — I890 Lymphedema, not elsewhere classified: Secondary | ICD-10-CM | POA: Diagnosis not present

## 2023-02-09 DIAGNOSIS — R262 Difficulty in walking, not elsewhere classified: Secondary | ICD-10-CM | POA: Diagnosis not present

## 2023-02-09 DIAGNOSIS — G8254 Quadriplegia, C5-C7 incomplete: Secondary | ICD-10-CM | POA: Diagnosis not present

## 2023-02-09 DIAGNOSIS — R269 Unspecified abnormalities of gait and mobility: Secondary | ICD-10-CM | POA: Diagnosis not present

## 2023-02-09 DIAGNOSIS — R29898 Other symptoms and signs involving the musculoskeletal system: Secondary | ICD-10-CM | POA: Diagnosis not present

## 2023-02-09 DIAGNOSIS — R2689 Other abnormalities of gait and mobility: Secondary | ICD-10-CM | POA: Diagnosis not present

## 2023-02-10 ENCOUNTER — Encounter: Payer: Medicare PPO | Admitting: Occupational Therapy

## 2023-02-10 ENCOUNTER — Telehealth: Payer: Self-pay | Admitting: "Endocrinology

## 2023-02-10 ENCOUNTER — Ambulatory Visit: Payer: Medicare PPO | Admitting: Physical Therapy

## 2023-02-10 DIAGNOSIS — R262 Difficulty in walking, not elsewhere classified: Secondary | ICD-10-CM | POA: Diagnosis not present

## 2023-02-10 DIAGNOSIS — R269 Unspecified abnormalities of gait and mobility: Secondary | ICD-10-CM | POA: Diagnosis not present

## 2023-02-10 DIAGNOSIS — R200 Anesthesia of skin: Secondary | ICD-10-CM | POA: Diagnosis not present

## 2023-02-10 DIAGNOSIS — K592 Neurogenic bowel, not elsewhere classified: Secondary | ICD-10-CM | POA: Diagnosis not present

## 2023-02-10 DIAGNOSIS — G8929 Other chronic pain: Secondary | ICD-10-CM | POA: Diagnosis not present

## 2023-02-10 DIAGNOSIS — M6250 Muscle wasting and atrophy, not elsewhere classified, unspecified site: Secondary | ICD-10-CM | POA: Diagnosis not present

## 2023-02-10 DIAGNOSIS — R2689 Other abnormalities of gait and mobility: Secondary | ICD-10-CM | POA: Diagnosis not present

## 2023-02-10 DIAGNOSIS — M62838 Other muscle spasm: Secondary | ICD-10-CM | POA: Diagnosis not present

## 2023-02-10 DIAGNOSIS — R29898 Other symptoms and signs involving the musculoskeletal system: Secondary | ICD-10-CM | POA: Diagnosis not present

## 2023-02-10 DIAGNOSIS — N319 Neuromuscular dysfunction of bladder, unspecified: Secondary | ICD-10-CM | POA: Diagnosis not present

## 2023-02-10 DIAGNOSIS — R2991 Unspecified symptoms and signs involving the musculoskeletal system: Secondary | ICD-10-CM | POA: Diagnosis not present

## 2023-02-10 DIAGNOSIS — R0989 Other specified symptoms and signs involving the circulatory and respiratory systems: Secondary | ICD-10-CM | POA: Diagnosis not present

## 2023-02-10 DIAGNOSIS — I890 Lymphedema, not elsewhere classified: Secondary | ICD-10-CM | POA: Diagnosis not present

## 2023-02-10 DIAGNOSIS — M792 Neuralgia and neuritis, unspecified: Secondary | ICD-10-CM | POA: Diagnosis not present

## 2023-02-10 DIAGNOSIS — G8254 Quadriplegia, C5-C7 incomplete: Secondary | ICD-10-CM | POA: Diagnosis not present

## 2023-02-10 NOTE — Telephone Encounter (Signed)
Patient is calling saying that she will be out of town prior to her office visit with Dr. Roosevelt Locks and wants to know if the lab orders can be faxed to her.  Her fax number is [phone].

## 2023-02-11 ENCOUNTER — Encounter: Payer: Self-pay | Admitting: "Endocrinology

## 2023-02-11 DIAGNOSIS — I890 Lymphedema, not elsewhere classified: Secondary | ICD-10-CM | POA: Diagnosis not present

## 2023-02-11 DIAGNOSIS — M6250 Muscle wasting and atrophy, not elsewhere classified, unspecified site: Secondary | ICD-10-CM | POA: Diagnosis not present

## 2023-02-11 DIAGNOSIS — R262 Difficulty in walking, not elsewhere classified: Secondary | ICD-10-CM | POA: Diagnosis not present

## 2023-02-11 DIAGNOSIS — R2689 Other abnormalities of gait and mobility: Secondary | ICD-10-CM | POA: Diagnosis not present

## 2023-02-11 DIAGNOSIS — R0989 Other specified symptoms and signs involving the circulatory and respiratory systems: Secondary | ICD-10-CM | POA: Diagnosis not present

## 2023-02-11 DIAGNOSIS — G8254 Quadriplegia, C5-C7 incomplete: Secondary | ICD-10-CM | POA: Diagnosis not present

## 2023-02-11 DIAGNOSIS — R29898 Other symptoms and signs involving the musculoskeletal system: Secondary | ICD-10-CM | POA: Diagnosis not present

## 2023-02-11 DIAGNOSIS — R269 Unspecified abnormalities of gait and mobility: Secondary | ICD-10-CM | POA: Diagnosis not present

## 2023-02-11 DIAGNOSIS — R2991 Unspecified symptoms and signs involving the musculoskeletal system: Secondary | ICD-10-CM | POA: Diagnosis not present

## 2023-02-15 ENCOUNTER — Other Ambulatory Visit: Payer: Self-pay

## 2023-02-15 DIAGNOSIS — R0989 Other specified symptoms and signs involving the circulatory and respiratory systems: Secondary | ICD-10-CM | POA: Diagnosis not present

## 2023-02-15 DIAGNOSIS — R29898 Other symptoms and signs involving the musculoskeletal system: Secondary | ICD-10-CM | POA: Diagnosis not present

## 2023-02-15 DIAGNOSIS — R262 Difficulty in walking, not elsewhere classified: Secondary | ICD-10-CM | POA: Diagnosis not present

## 2023-02-15 DIAGNOSIS — R2991 Unspecified symptoms and signs involving the musculoskeletal system: Secondary | ICD-10-CM | POA: Diagnosis not present

## 2023-02-15 DIAGNOSIS — R2689 Other abnormalities of gait and mobility: Secondary | ICD-10-CM | POA: Diagnosis not present

## 2023-02-15 DIAGNOSIS — I890 Lymphedema, not elsewhere classified: Secondary | ICD-10-CM | POA: Diagnosis not present

## 2023-02-15 DIAGNOSIS — G8254 Quadriplegia, C5-C7 incomplete: Secondary | ICD-10-CM | POA: Diagnosis not present

## 2023-02-15 DIAGNOSIS — R269 Unspecified abnormalities of gait and mobility: Secondary | ICD-10-CM | POA: Diagnosis not present

## 2023-02-15 DIAGNOSIS — M6250 Muscle wasting and atrophy, not elsewhere classified, unspecified site: Secondary | ICD-10-CM | POA: Diagnosis not present

## 2023-02-15 DIAGNOSIS — G8250 Quadriplegia, unspecified: Secondary | ICD-10-CM

## 2023-02-15 DIAGNOSIS — M810 Age-related osteoporosis without current pathological fracture: Secondary | ICD-10-CM

## 2023-02-15 NOTE — Telephone Encounter (Signed)
Lab orders have been faxed

## 2023-02-16 DIAGNOSIS — R2991 Unspecified symptoms and signs involving the musculoskeletal system: Secondary | ICD-10-CM | POA: Diagnosis not present

## 2023-02-16 DIAGNOSIS — M6250 Muscle wasting and atrophy, not elsewhere classified, unspecified site: Secondary | ICD-10-CM | POA: Diagnosis not present

## 2023-02-16 DIAGNOSIS — R29898 Other symptoms and signs involving the musculoskeletal system: Secondary | ICD-10-CM | POA: Diagnosis not present

## 2023-02-16 DIAGNOSIS — R262 Difficulty in walking, not elsewhere classified: Secondary | ICD-10-CM | POA: Diagnosis not present

## 2023-02-16 DIAGNOSIS — R2689 Other abnormalities of gait and mobility: Secondary | ICD-10-CM | POA: Diagnosis not present

## 2023-02-16 DIAGNOSIS — G8254 Quadriplegia, C5-C7 incomplete: Secondary | ICD-10-CM | POA: Diagnosis not present

## 2023-02-16 DIAGNOSIS — R269 Unspecified abnormalities of gait and mobility: Secondary | ICD-10-CM | POA: Diagnosis not present

## 2023-02-16 DIAGNOSIS — I890 Lymphedema, not elsewhere classified: Secondary | ICD-10-CM | POA: Diagnosis not present

## 2023-02-16 DIAGNOSIS — R0989 Other specified symptoms and signs involving the circulatory and respiratory systems: Secondary | ICD-10-CM | POA: Diagnosis not present

## 2023-02-17 DIAGNOSIS — G8254 Quadriplegia, C5-C7 incomplete: Secondary | ICD-10-CM | POA: Diagnosis not present

## 2023-02-17 DIAGNOSIS — R0989 Other specified symptoms and signs involving the circulatory and respiratory systems: Secondary | ICD-10-CM | POA: Diagnosis not present

## 2023-02-17 DIAGNOSIS — R29898 Other symptoms and signs involving the musculoskeletal system: Secondary | ICD-10-CM | POA: Diagnosis not present

## 2023-02-17 DIAGNOSIS — R2991 Unspecified symptoms and signs involving the musculoskeletal system: Secondary | ICD-10-CM | POA: Diagnosis not present

## 2023-02-17 DIAGNOSIS — K592 Neurogenic bowel, not elsewhere classified: Secondary | ICD-10-CM | POA: Diagnosis not present

## 2023-02-17 DIAGNOSIS — M6250 Muscle wasting and atrophy, not elsewhere classified, unspecified site: Secondary | ICD-10-CM | POA: Diagnosis not present

## 2023-02-17 DIAGNOSIS — M792 Neuralgia and neuritis, unspecified: Secondary | ICD-10-CM | POA: Diagnosis not present

## 2023-02-17 DIAGNOSIS — M62838 Other muscle spasm: Secondary | ICD-10-CM | POA: Diagnosis not present

## 2023-02-17 DIAGNOSIS — N319 Neuromuscular dysfunction of bladder, unspecified: Secondary | ICD-10-CM | POA: Diagnosis not present

## 2023-02-17 DIAGNOSIS — R269 Unspecified abnormalities of gait and mobility: Secondary | ICD-10-CM | POA: Diagnosis not present

## 2023-02-17 DIAGNOSIS — R2689 Other abnormalities of gait and mobility: Secondary | ICD-10-CM | POA: Diagnosis not present

## 2023-02-17 DIAGNOSIS — R262 Difficulty in walking, not elsewhere classified: Secondary | ICD-10-CM | POA: Diagnosis not present

## 2023-02-17 DIAGNOSIS — G8929 Other chronic pain: Secondary | ICD-10-CM | POA: Diagnosis not present

## 2023-02-17 DIAGNOSIS — I890 Lymphedema, not elsewhere classified: Secondary | ICD-10-CM | POA: Diagnosis not present

## 2023-02-17 DIAGNOSIS — R200 Anesthesia of skin: Secondary | ICD-10-CM | POA: Diagnosis not present

## 2023-02-18 DIAGNOSIS — G8254 Quadriplegia, C5-C7 incomplete: Secondary | ICD-10-CM | POA: Diagnosis not present

## 2023-02-18 DIAGNOSIS — R2689 Other abnormalities of gait and mobility: Secondary | ICD-10-CM | POA: Diagnosis not present

## 2023-02-18 DIAGNOSIS — R269 Unspecified abnormalities of gait and mobility: Secondary | ICD-10-CM | POA: Diagnosis not present

## 2023-02-18 DIAGNOSIS — R2991 Unspecified symptoms and signs involving the musculoskeletal system: Secondary | ICD-10-CM | POA: Diagnosis not present

## 2023-02-18 DIAGNOSIS — R262 Difficulty in walking, not elsewhere classified: Secondary | ICD-10-CM | POA: Diagnosis not present

## 2023-02-18 DIAGNOSIS — I890 Lymphedema, not elsewhere classified: Secondary | ICD-10-CM | POA: Diagnosis not present

## 2023-02-18 DIAGNOSIS — R0989 Other specified symptoms and signs involving the circulatory and respiratory systems: Secondary | ICD-10-CM | POA: Diagnosis not present

## 2023-02-18 DIAGNOSIS — M6250 Muscle wasting and atrophy, not elsewhere classified, unspecified site: Secondary | ICD-10-CM | POA: Diagnosis not present

## 2023-02-18 DIAGNOSIS — R29898 Other symptoms and signs involving the musculoskeletal system: Secondary | ICD-10-CM | POA: Diagnosis not present

## 2023-02-21 DIAGNOSIS — M6250 Muscle wasting and atrophy, not elsewhere classified, unspecified site: Secondary | ICD-10-CM | POA: Diagnosis not present

## 2023-02-21 DIAGNOSIS — R2689 Other abnormalities of gait and mobility: Secondary | ICD-10-CM | POA: Diagnosis not present

## 2023-02-21 DIAGNOSIS — R0989 Other specified symptoms and signs involving the circulatory and respiratory systems: Secondary | ICD-10-CM | POA: Diagnosis not present

## 2023-02-21 DIAGNOSIS — R262 Difficulty in walking, not elsewhere classified: Secondary | ICD-10-CM | POA: Diagnosis not present

## 2023-02-21 DIAGNOSIS — R269 Unspecified abnormalities of gait and mobility: Secondary | ICD-10-CM | POA: Diagnosis not present

## 2023-02-21 DIAGNOSIS — G8254 Quadriplegia, C5-C7 incomplete: Secondary | ICD-10-CM | POA: Diagnosis not present

## 2023-02-21 DIAGNOSIS — I890 Lymphedema, not elsewhere classified: Secondary | ICD-10-CM | POA: Diagnosis not present

## 2023-02-21 DIAGNOSIS — R2991 Unspecified symptoms and signs involving the musculoskeletal system: Secondary | ICD-10-CM | POA: Diagnosis not present

## 2023-02-21 DIAGNOSIS — R29898 Other symptoms and signs involving the musculoskeletal system: Secondary | ICD-10-CM | POA: Diagnosis not present

## 2023-02-22 DIAGNOSIS — R2689 Other abnormalities of gait and mobility: Secondary | ICD-10-CM | POA: Diagnosis not present

## 2023-02-22 DIAGNOSIS — R2991 Unspecified symptoms and signs involving the musculoskeletal system: Secondary | ICD-10-CM | POA: Diagnosis not present

## 2023-02-22 DIAGNOSIS — R269 Unspecified abnormalities of gait and mobility: Secondary | ICD-10-CM | POA: Diagnosis not present

## 2023-02-22 DIAGNOSIS — R29898 Other symptoms and signs involving the musculoskeletal system: Secondary | ICD-10-CM | POA: Diagnosis not present

## 2023-02-22 DIAGNOSIS — M6250 Muscle wasting and atrophy, not elsewhere classified, unspecified site: Secondary | ICD-10-CM | POA: Diagnosis not present

## 2023-02-22 DIAGNOSIS — I890 Lymphedema, not elsewhere classified: Secondary | ICD-10-CM | POA: Diagnosis not present

## 2023-02-22 DIAGNOSIS — R262 Difficulty in walking, not elsewhere classified: Secondary | ICD-10-CM | POA: Diagnosis not present

## 2023-02-22 DIAGNOSIS — R0989 Other specified symptoms and signs involving the circulatory and respiratory systems: Secondary | ICD-10-CM | POA: Diagnosis not present

## 2023-02-22 DIAGNOSIS — G8254 Quadriplegia, C5-C7 incomplete: Secondary | ICD-10-CM | POA: Diagnosis not present

## 2023-02-23 DIAGNOSIS — R29898 Other symptoms and signs involving the musculoskeletal system: Secondary | ICD-10-CM | POA: Diagnosis not present

## 2023-02-23 DIAGNOSIS — R269 Unspecified abnormalities of gait and mobility: Secondary | ICD-10-CM | POA: Diagnosis not present

## 2023-02-23 DIAGNOSIS — R2689 Other abnormalities of gait and mobility: Secondary | ICD-10-CM | POA: Diagnosis not present

## 2023-02-23 DIAGNOSIS — R0989 Other specified symptoms and signs involving the circulatory and respiratory systems: Secondary | ICD-10-CM | POA: Diagnosis not present

## 2023-02-23 DIAGNOSIS — I890 Lymphedema, not elsewhere classified: Secondary | ICD-10-CM | POA: Diagnosis not present

## 2023-02-23 DIAGNOSIS — R2991 Unspecified symptoms and signs involving the musculoskeletal system: Secondary | ICD-10-CM | POA: Diagnosis not present

## 2023-02-23 DIAGNOSIS — M6250 Muscle wasting and atrophy, not elsewhere classified, unspecified site: Secondary | ICD-10-CM | POA: Diagnosis not present

## 2023-02-23 DIAGNOSIS — R262 Difficulty in walking, not elsewhere classified: Secondary | ICD-10-CM | POA: Diagnosis not present

## 2023-02-23 DIAGNOSIS — G8254 Quadriplegia, C5-C7 incomplete: Secondary | ICD-10-CM | POA: Diagnosis not present

## 2023-02-24 DIAGNOSIS — R2991 Unspecified symptoms and signs involving the musculoskeletal system: Secondary | ICD-10-CM | POA: Diagnosis not present

## 2023-02-24 DIAGNOSIS — R0989 Other specified symptoms and signs involving the circulatory and respiratory systems: Secondary | ICD-10-CM | POA: Diagnosis not present

## 2023-02-24 DIAGNOSIS — R29898 Other symptoms and signs involving the musculoskeletal system: Secondary | ICD-10-CM | POA: Diagnosis not present

## 2023-02-24 DIAGNOSIS — M6250 Muscle wasting and atrophy, not elsewhere classified, unspecified site: Secondary | ICD-10-CM | POA: Diagnosis not present

## 2023-02-24 DIAGNOSIS — G8254 Quadriplegia, C5-C7 incomplete: Secondary | ICD-10-CM | POA: Diagnosis not present

## 2023-02-24 DIAGNOSIS — R2689 Other abnormalities of gait and mobility: Secondary | ICD-10-CM | POA: Diagnosis not present

## 2023-02-24 DIAGNOSIS — R262 Difficulty in walking, not elsewhere classified: Secondary | ICD-10-CM | POA: Diagnosis not present

## 2023-02-24 DIAGNOSIS — R269 Unspecified abnormalities of gait and mobility: Secondary | ICD-10-CM | POA: Diagnosis not present

## 2023-02-24 DIAGNOSIS — I890 Lymphedema, not elsewhere classified: Secondary | ICD-10-CM | POA: Diagnosis not present

## 2023-02-25 ENCOUNTER — Telehealth: Payer: Self-pay | Admitting: Internal Medicine

## 2023-02-25 DIAGNOSIS — R269 Unspecified abnormalities of gait and mobility: Secondary | ICD-10-CM | POA: Diagnosis not present

## 2023-02-25 DIAGNOSIS — I890 Lymphedema, not elsewhere classified: Secondary | ICD-10-CM | POA: Diagnosis not present

## 2023-02-25 DIAGNOSIS — R2689 Other abnormalities of gait and mobility: Secondary | ICD-10-CM | POA: Diagnosis not present

## 2023-02-25 DIAGNOSIS — R29898 Other symptoms and signs involving the musculoskeletal system: Secondary | ICD-10-CM | POA: Diagnosis not present

## 2023-02-25 DIAGNOSIS — G8254 Quadriplegia, C5-C7 incomplete: Secondary | ICD-10-CM | POA: Diagnosis not present

## 2023-02-25 DIAGNOSIS — M6250 Muscle wasting and atrophy, not elsewhere classified, unspecified site: Secondary | ICD-10-CM | POA: Diagnosis not present

## 2023-02-25 DIAGNOSIS — R0989 Other specified symptoms and signs involving the circulatory and respiratory systems: Secondary | ICD-10-CM | POA: Diagnosis not present

## 2023-02-25 DIAGNOSIS — R2991 Unspecified symptoms and signs involving the musculoskeletal system: Secondary | ICD-10-CM | POA: Diagnosis not present

## 2023-02-25 DIAGNOSIS — R262 Difficulty in walking, not elsewhere classified: Secondary | ICD-10-CM | POA: Diagnosis not present

## 2023-02-25 NOTE — Telephone Encounter (Signed)
[name] Practical nurse call and stated that pt is not eating much and is down to 110 also if need to call [name] her  # is [phone].

## 2023-02-28 DIAGNOSIS — G8254 Quadriplegia, C5-C7 incomplete: Secondary | ICD-10-CM | POA: Diagnosis not present

## 2023-02-28 DIAGNOSIS — R0989 Other specified symptoms and signs involving the circulatory and respiratory systems: Secondary | ICD-10-CM | POA: Diagnosis not present

## 2023-02-28 DIAGNOSIS — R2689 Other abnormalities of gait and mobility: Secondary | ICD-10-CM | POA: Diagnosis not present

## 2023-02-28 DIAGNOSIS — M6250 Muscle wasting and atrophy, not elsewhere classified, unspecified site: Secondary | ICD-10-CM | POA: Diagnosis not present

## 2023-02-28 DIAGNOSIS — R262 Difficulty in walking, not elsewhere classified: Secondary | ICD-10-CM | POA: Diagnosis not present

## 2023-02-28 DIAGNOSIS — R2991 Unspecified symptoms and signs involving the musculoskeletal system: Secondary | ICD-10-CM | POA: Diagnosis not present

## 2023-02-28 DIAGNOSIS — I890 Lymphedema, not elsewhere classified: Secondary | ICD-10-CM | POA: Diagnosis not present

## 2023-02-28 DIAGNOSIS — R269 Unspecified abnormalities of gait and mobility: Secondary | ICD-10-CM | POA: Diagnosis not present

## 2023-02-28 DIAGNOSIS — R29898 Other symptoms and signs involving the musculoskeletal system: Secondary | ICD-10-CM | POA: Diagnosis not present

## 2023-03-01 DIAGNOSIS — G8929 Other chronic pain: Secondary | ICD-10-CM | POA: Diagnosis not present

## 2023-03-01 DIAGNOSIS — R269 Unspecified abnormalities of gait and mobility: Secondary | ICD-10-CM | POA: Diagnosis not present

## 2023-03-01 DIAGNOSIS — I890 Lymphedema, not elsewhere classified: Secondary | ICD-10-CM | POA: Diagnosis not present

## 2023-03-01 DIAGNOSIS — R262 Difficulty in walking, not elsewhere classified: Secondary | ICD-10-CM | POA: Diagnosis not present

## 2023-03-01 DIAGNOSIS — R2991 Unspecified symptoms and signs involving the musculoskeletal system: Secondary | ICD-10-CM | POA: Diagnosis not present

## 2023-03-01 DIAGNOSIS — M62838 Other muscle spasm: Secondary | ICD-10-CM | POA: Diagnosis not present

## 2023-03-01 DIAGNOSIS — R29898 Other symptoms and signs involving the musculoskeletal system: Secondary | ICD-10-CM | POA: Diagnosis not present

## 2023-03-01 DIAGNOSIS — R200 Anesthesia of skin: Secondary | ICD-10-CM | POA: Diagnosis not present

## 2023-03-01 DIAGNOSIS — N319 Neuromuscular dysfunction of bladder, unspecified: Secondary | ICD-10-CM | POA: Diagnosis not present

## 2023-03-01 DIAGNOSIS — R0989 Other specified symptoms and signs involving the circulatory and respiratory systems: Secondary | ICD-10-CM | POA: Diagnosis not present

## 2023-03-01 DIAGNOSIS — M6250 Muscle wasting and atrophy, not elsewhere classified, unspecified site: Secondary | ICD-10-CM | POA: Diagnosis not present

## 2023-03-01 DIAGNOSIS — K592 Neurogenic bowel, not elsewhere classified: Secondary | ICD-10-CM | POA: Diagnosis not present

## 2023-03-01 DIAGNOSIS — R2689 Other abnormalities of gait and mobility: Secondary | ICD-10-CM | POA: Diagnosis not present

## 2023-03-01 DIAGNOSIS — M792 Neuralgia and neuritis, unspecified: Secondary | ICD-10-CM | POA: Diagnosis not present

## 2023-03-01 DIAGNOSIS — G8254 Quadriplegia, C5-C7 incomplete: Secondary | ICD-10-CM | POA: Diagnosis not present

## 2023-03-02 DIAGNOSIS — R0989 Other specified symptoms and signs involving the circulatory and respiratory systems: Secondary | ICD-10-CM | POA: Diagnosis not present

## 2023-03-02 DIAGNOSIS — I890 Lymphedema, not elsewhere classified: Secondary | ICD-10-CM | POA: Diagnosis not present

## 2023-03-02 DIAGNOSIS — R2991 Unspecified symptoms and signs involving the musculoskeletal system: Secondary | ICD-10-CM | POA: Diagnosis not present

## 2023-03-02 DIAGNOSIS — R29898 Other symptoms and signs involving the musculoskeletal system: Secondary | ICD-10-CM | POA: Diagnosis not present

## 2023-03-02 DIAGNOSIS — R262 Difficulty in walking, not elsewhere classified: Secondary | ICD-10-CM | POA: Diagnosis not present

## 2023-03-02 DIAGNOSIS — R2689 Other abnormalities of gait and mobility: Secondary | ICD-10-CM | POA: Diagnosis not present

## 2023-03-02 DIAGNOSIS — G8254 Quadriplegia, C5-C7 incomplete: Secondary | ICD-10-CM | POA: Diagnosis not present

## 2023-03-02 DIAGNOSIS — R269 Unspecified abnormalities of gait and mobility: Secondary | ICD-10-CM | POA: Diagnosis not present

## 2023-03-02 DIAGNOSIS — M6250 Muscle wasting and atrophy, not elsewhere classified, unspecified site: Secondary | ICD-10-CM | POA: Diagnosis not present

## 2023-03-03 DIAGNOSIS — R2689 Other abnormalities of gait and mobility: Secondary | ICD-10-CM | POA: Diagnosis not present

## 2023-03-03 DIAGNOSIS — M6250 Muscle wasting and atrophy, not elsewhere classified, unspecified site: Secondary | ICD-10-CM | POA: Diagnosis not present

## 2023-03-03 DIAGNOSIS — R262 Difficulty in walking, not elsewhere classified: Secondary | ICD-10-CM | POA: Diagnosis not present

## 2023-03-03 DIAGNOSIS — R29898 Other symptoms and signs involving the musculoskeletal system: Secondary | ICD-10-CM | POA: Diagnosis not present

## 2023-03-03 DIAGNOSIS — R0989 Other specified symptoms and signs involving the circulatory and respiratory systems: Secondary | ICD-10-CM | POA: Diagnosis not present

## 2023-03-03 DIAGNOSIS — G8254 Quadriplegia, C5-C7 incomplete: Secondary | ICD-10-CM | POA: Diagnosis not present

## 2023-03-03 DIAGNOSIS — R269 Unspecified abnormalities of gait and mobility: Secondary | ICD-10-CM | POA: Diagnosis not present

## 2023-03-03 DIAGNOSIS — I890 Lymphedema, not elsewhere classified: Secondary | ICD-10-CM | POA: Diagnosis not present

## 2023-03-03 DIAGNOSIS — R2991 Unspecified symptoms and signs involving the musculoskeletal system: Secondary | ICD-10-CM | POA: Diagnosis not present

## 2023-03-04 DIAGNOSIS — R0989 Other specified symptoms and signs involving the circulatory and respiratory systems: Secondary | ICD-10-CM | POA: Diagnosis not present

## 2023-03-04 DIAGNOSIS — R2689 Other abnormalities of gait and mobility: Secondary | ICD-10-CM | POA: Diagnosis not present

## 2023-03-04 DIAGNOSIS — M6250 Muscle wasting and atrophy, not elsewhere classified, unspecified site: Secondary | ICD-10-CM | POA: Diagnosis not present

## 2023-03-04 DIAGNOSIS — R262 Difficulty in walking, not elsewhere classified: Secondary | ICD-10-CM | POA: Diagnosis not present

## 2023-03-04 DIAGNOSIS — R29898 Other symptoms and signs involving the musculoskeletal system: Secondary | ICD-10-CM | POA: Diagnosis not present

## 2023-03-04 DIAGNOSIS — I890 Lymphedema, not elsewhere classified: Secondary | ICD-10-CM | POA: Diagnosis not present

## 2023-03-04 DIAGNOSIS — R2991 Unspecified symptoms and signs involving the musculoskeletal system: Secondary | ICD-10-CM | POA: Diagnosis not present

## 2023-03-04 DIAGNOSIS — G8254 Quadriplegia, C5-C7 incomplete: Secondary | ICD-10-CM | POA: Diagnosis not present

## 2023-03-04 DIAGNOSIS — R269 Unspecified abnormalities of gait and mobility: Secondary | ICD-10-CM | POA: Diagnosis not present

## 2023-03-06 DIAGNOSIS — K219 Gastro-esophageal reflux disease without esophagitis: Secondary | ICD-10-CM | POA: Diagnosis not present

## 2023-03-06 DIAGNOSIS — R7401 Elevation of levels of liver transaminase levels: Secondary | ICD-10-CM | POA: Diagnosis not present

## 2023-03-06 DIAGNOSIS — A419 Sepsis, unspecified organism: Secondary | ICD-10-CM | POA: Diagnosis not present

## 2023-03-06 DIAGNOSIS — E871 Hypo-osmolality and hyponatremia: Secondary | ICD-10-CM | POA: Diagnosis not present

## 2023-03-06 DIAGNOSIS — B349 Viral infection, unspecified: Secondary | ICD-10-CM | POA: Diagnosis not present

## 2023-03-06 DIAGNOSIS — J189 Pneumonia, unspecified organism: Secondary | ICD-10-CM | POA: Diagnosis not present

## 2023-03-06 DIAGNOSIS — E785 Hyperlipidemia, unspecified: Secondary | ICD-10-CM | POA: Diagnosis not present

## 2023-03-06 DIAGNOSIS — Z681 Body mass index (BMI) 19 or less, adult: Secondary | ICD-10-CM | POA: Diagnosis not present

## 2023-03-06 DIAGNOSIS — G9349 Other encephalopathy: Secondary | ICD-10-CM | POA: Diagnosis not present

## 2023-03-06 DIAGNOSIS — B348 Other viral infections of unspecified site: Secondary | ICD-10-CM | POA: Diagnosis not present

## 2023-03-06 DIAGNOSIS — E441 Mild protein-calorie malnutrition: Secondary | ICD-10-CM | POA: Diagnosis not present

## 2023-03-06 DIAGNOSIS — J209 Acute bronchitis, unspecified: Secondary | ICD-10-CM | POA: Diagnosis not present

## 2023-03-06 DIAGNOSIS — R9389 Abnormal findings on diagnostic imaging of other specified body structures: Secondary | ICD-10-CM | POA: Diagnosis not present

## 2023-03-06 DIAGNOSIS — B9789 Other viral agents as the cause of diseases classified elsewhere: Secondary | ICD-10-CM | POA: Diagnosis not present

## 2023-03-06 DIAGNOSIS — R42 Dizziness and giddiness: Secondary | ICD-10-CM | POA: Diagnosis not present

## 2023-03-06 DIAGNOSIS — G8220 Paraplegia, unspecified: Secondary | ICD-10-CM | POA: Diagnosis not present

## 2023-03-06 DIAGNOSIS — G8250 Quadriplegia, unspecified: Secondary | ICD-10-CM | POA: Diagnosis not present

## 2023-03-06 DIAGNOSIS — R1310 Dysphagia, unspecified: Secondary | ICD-10-CM | POA: Diagnosis not present

## 2023-03-06 DIAGNOSIS — N390 Urinary tract infection, site not specified: Secondary | ICD-10-CM | POA: Diagnosis not present

## 2023-03-06 DIAGNOSIS — J470 Bronchiectasis with acute lower respiratory infection: Secondary | ICD-10-CM | POA: Diagnosis not present

## 2023-03-06 DIAGNOSIS — J181 Lobar pneumonia, unspecified organism: Secondary | ICD-10-CM | POA: Diagnosis not present

## 2023-03-07 ENCOUNTER — Telehealth: Payer: Self-pay | Admitting: Internal Medicine

## 2023-03-07 NOTE — Telephone Encounter (Signed)
Hospitalized yesterday, possible aspiration pneumonia and rhinovirus, at [facility] in [place].

## 2023-03-07 NOTE — Telephone Encounter (Signed)
Noted  some notes are on care everywhere.

## 2023-03-09 ENCOUNTER — Telehealth: Payer: Self-pay | Admitting: Internal Medicine

## 2023-03-09 NOTE — Telephone Encounter (Signed)
Would advise  if needed fu with another provider unless she feels can wait until   october 22 can use afternoon virtual appts .Marland Kitchen  I see that she has appt with ID  on October 1 which  would be a most important  visit

## 2023-03-09 NOTE — Telephone Encounter (Signed)
Pt needs hospital f/u, no availability until the end of October. Seeking guidance, should patient be scheduled with another provider or can provider accommodate patient.

## 2023-03-10 DIAGNOSIS — N319 Neuromuscular dysfunction of bladder, unspecified: Secondary | ICD-10-CM | POA: Diagnosis not present

## 2023-03-10 DIAGNOSIS — M62838 Other muscle spasm: Secondary | ICD-10-CM | POA: Diagnosis not present

## 2023-03-10 DIAGNOSIS — R269 Unspecified abnormalities of gait and mobility: Secondary | ICD-10-CM | POA: Diagnosis not present

## 2023-03-10 DIAGNOSIS — R200 Anesthesia of skin: Secondary | ICD-10-CM | POA: Diagnosis not present

## 2023-03-10 DIAGNOSIS — G8254 Quadriplegia, C5-C7 incomplete: Secondary | ICD-10-CM | POA: Diagnosis not present

## 2023-03-10 DIAGNOSIS — K592 Neurogenic bowel, not elsewhere classified: Secondary | ICD-10-CM | POA: Diagnosis not present

## 2023-03-10 DIAGNOSIS — R2991 Unspecified symptoms and signs involving the musculoskeletal system: Secondary | ICD-10-CM | POA: Diagnosis not present

## 2023-03-10 DIAGNOSIS — M792 Neuralgia and neuritis, unspecified: Secondary | ICD-10-CM | POA: Diagnosis not present

## 2023-03-10 DIAGNOSIS — I890 Lymphedema, not elsewhere classified: Secondary | ICD-10-CM | POA: Diagnosis not present

## 2023-03-10 DIAGNOSIS — R2689 Other abnormalities of gait and mobility: Secondary | ICD-10-CM | POA: Diagnosis not present

## 2023-03-10 DIAGNOSIS — R0989 Other specified symptoms and signs involving the circulatory and respiratory systems: Secondary | ICD-10-CM | POA: Diagnosis not present

## 2023-03-10 DIAGNOSIS — R29898 Other symptoms and signs involving the musculoskeletal system: Secondary | ICD-10-CM | POA: Diagnosis not present

## 2023-03-10 DIAGNOSIS — R262 Difficulty in walking, not elsewhere classified: Secondary | ICD-10-CM | POA: Diagnosis not present

## 2023-03-10 DIAGNOSIS — M6250 Muscle wasting and atrophy, not elsewhere classified, unspecified site: Secondary | ICD-10-CM | POA: Diagnosis not present

## 2023-03-10 DIAGNOSIS — G8929 Other chronic pain: Secondary | ICD-10-CM | POA: Diagnosis not present

## 2023-03-11 ENCOUNTER — Other Ambulatory Visit: Payer: Medicare PPO

## 2023-03-12 ENCOUNTER — Other Ambulatory Visit: Payer: Self-pay | Admitting: Internal Medicine

## 2023-03-12 ENCOUNTER — Other Ambulatory Visit: Payer: Self-pay | Admitting: Infectious Diseases

## 2023-03-14 ENCOUNTER — Other Ambulatory Visit: Payer: Medicare PPO

## 2023-03-14 NOTE — Telephone Encounter (Signed)
Spoke to pt. Inform pt of provider's message.   Pt states she will have her hospital follow up with Infectious Disease provider tomorrow.  If need a follow up with PCP, she will call to schedule an appointment.

## 2023-03-15 ENCOUNTER — Other Ambulatory Visit: Payer: Self-pay

## 2023-03-15 ENCOUNTER — Encounter: Payer: Self-pay | Admitting: Infectious Diseases

## 2023-03-15 ENCOUNTER — Ambulatory Visit: Payer: Medicare PPO | Admitting: Infectious Diseases

## 2023-03-15 VITALS — BP 108/71 | HR 98 | Resp 16 | Ht 64.0 in | Wt 111.0 lb

## 2023-03-15 DIAGNOSIS — Z5181 Encounter for therapeutic drug level monitoring: Secondary | ICD-10-CM

## 2023-03-15 DIAGNOSIS — N390 Urinary tract infection, site not specified: Secondary | ICD-10-CM | POA: Diagnosis not present

## 2023-03-15 DIAGNOSIS — J471 Bronchiectasis with (acute) exacerbation: Secondary | ICD-10-CM | POA: Insufficient documentation

## 2023-03-15 DIAGNOSIS — S31829A Unspecified open wound of left buttock, initial encounter: Secondary | ICD-10-CM

## 2023-03-15 DIAGNOSIS — S31829D Unspecified open wound of left buttock, subsequent encounter: Secondary | ICD-10-CM

## 2023-03-15 NOTE — Progress Notes (Unsigned)
Patient Active Problem List   Diagnosis Date Noted  . Buttock wound, left, initial encounter 11/02/2022  . Orthostatic hypotension 08/13/2022  . Neurogenic bowel 05/03/2022  . Spasticity 05/03/2022  . 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/03/2022  . Nerve pain 05/03/2022  . Medication monitoring encounter 01/08/2022  . Neurogenic bladder 10/11/2021  . Urinary incontinence 10/11/2021  . ESBL (extended spectrum beta-lactamase) producing bacteria infection 10/09/2021  . Recurrent UTI 10/09/2021  . Quadriplegia, C5-C7 incomplete (HCC) 01/16/2021  . History of spinal fracture 01/16/2021  . Suprapubic catheter (HCC) 01/16/2021  . Encounter for routine gynecological examination 09/28/2013  . Onychomycosis 09/28/2013  . Foot deformity, acquired 03/26/2012  . Encounter for preventive health examination 12/25/2010  . ROSACEA 08/25/2009  . Disturbance in sleep behavior 03/11/2008  . SKIN CANCER, HX OF 03/11/2008  . DYSURIA, HX OF 03/11/2008  . Hyperlipidemia 02/10/2007  . CERVICALGIA 02/10/2007    Patient's Medications  New Prescriptions   No medications on file  Previous Medications   AMOXICILLIN-CLAVULANATE (AUGMENTIN) 875-125 MG TABLET    Take 1 tablet by mouth 2 (two) times daily.   ASCORBIC ACID (VITAMIN C) 1000 MG TABLET    Take 1 tablet (1,000 mg total) by mouth in the morning, at noon, in the evening, and at bedtime.   ATORVASTATIN (LIPITOR) 20 MG TABLET    Take 1 tablet (20 mg total) by mouth daily.   BACLOFEN (LIORESAL) 20 MG TABLET    Take 2 tablets (40 mg total) by mouth 3 (three) times daily. For spasticity   BISACODYL (DULCOLAX) 10 MG SUPPOSITORY    Place 10 mg rectally as needed for moderate constipation. Insert one suppository per rectum with each bowel program procedure.   CHOLECALCIFEROL (VITAMIN D3) 25 MCG (1000 UNIT) TABLET    Take 1,000 Units by mouth daily. 2000u   [employer] COENZYME Q-10 100 MG CAPSULE    Take 100 mg by mouth daily.    DOCUSATE SODIUM (DSS) 100 MG CAPS     Take by mouth.   FAMOTIDINE (PEPCID) 20 MG TABLET    Take 20 mg by mouth 2 (two) times daily.   FESOTERODINE (TOVIAZ) 8 MG TB24 TABLET    Take 1 tablet (8 mg total) by mouth daily.   GABAPENTIN (NEURONTIN) 600 MG TABLET    TAKE 2 TABLETS (1,200 MG TOTAL) BY MOUTH 3 (THREE) TIMES DAILY.   METHENAMINE (HIPREX) 1 G TABLET    TAKE 1 TABLET BY MOUTH TWICE A DAY WITH MEALS   METRONIDAZOLE (METROGEL) 1 % GEL    APPLY TOPICALLY EVERY DAY   MIRABEGRON ER (MYRBETRIQ) 25 MG TB24 TABLET    Take 50 mg by mouth.   MULTIPLE VITAMINS-MINERALS (CENTRUM SILVER ULTRA WOMENS PO)    Take by mouth.   MUPIROCIN OINTMENT (BACTROBAN) 2 %    Apply 1 Application topically 2 (two) times daily.   MYRBETRIQ 50 MG TB24 TABLET    TAKE 1 TABLET BY MOUTH EVERY DAY   NAPROXEN SODIUM (ALEVE) 220 MG TABLET    Take 220 mg by mouth. Tablet p.o per package directions as needed for pain   NORTRIPTYLINE (PAMELOR) 50 MG CAPSULE    Take 1 capsule (50 mg total) by mouth at bedtime.   SANTYL 250 UNIT/GM OINTMENT       SENNA CO    by Combination route. Sennosides 8.6mg  tab p.o per package directions daily as needed to promote bowel movement.   UNABLE TO FIND    Med Name: Saline Enema  Per rectum per package directions as needed for relief of constipation.   ZOLPIDEM (AMBIEN) 10 MG TABLET    TAKE 1/2-1 TABLET BY MOUTH AT BEDTIME AS NEEDED FOR SLEEP  Modified Medications   No medications on file  Discontinued Medications   No medications on file    Subjective: 72 YO Female with h/o Incomplete quadriplegia with neurogenic bladder s/p SPC with urinary incontinence who is here for fu in the setting of recurrent UTI. 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 Denies any UTIs so far. She has an aide who helps with [facility] exchange and follows up with Urology as needed.   Of note, she has been starting to follow up with wound care regarding left buttock wound that  developed approx 5 months ago per wound care  notes when she was being transferred and hit a metal hinge. Seen by wound care, 4/29 visit " there is a punched-out circular wound with increased depth but does not probe to bone. Granulation tissue present along with nonviable tissue. No signs of surrounding infection including increased warmth, erythema or purulent drainage. Rolled edges circumferentially."  She has a wound cx done on 5/20 that is growing GNR and was prescribed doxycycline yesterday. They report the cx was done from the discharge that came from inside but was told it does not go down to bone but has tunneling. Next fu with wound care is Monday. Aide she had completed a short course of augmentin approx a month ago for the buttock wound.   03/15/23 Accompanied by ger caretaker. Admitted 9/22 - 9/25 for CAP/Bronchiectasis. Received Iv meropenem/azithromycin in the hospital which was switched to PO augmentin on discharge to complete 7 days. Last day of po augmentin is tomorrow. Shortness of breath and chest pain has improved but still has some dry cough. She feels significantly better. Denies fevers, chills. Not taking methenamine due to being on abtx since hospitalized and plan to resume tomorrow evening, She is taking Vit c however. No UTIs since last seen. Went to [facility] recently for inpatient intensive PT and vefy pleased with the therapy. No refills needed and she knows to call clinic in case she runs out of pills.   Review of Systems: all systems reviewed with pertinent positives and negatives as listed above.    Past Medical History:  Diagnosis Date  . CERVICAL POLYP 03/11/2008   Qualifier: Diagnosis of  By: [name] MD, [name]   . Colon polyps 2005   on colonscopy [name]  . Fibroid 2004   Per Dr. [name]  . History of shingles    face and mouth  . Hx of skin cancer, basal cell   . Rosacea   . Sciatica of left side 09/28/2013  . Scoliosis    noted on mri done for back  pain   Past Surgical History:  Procedure Laterality Date  . BUNIONECTOMY      Social History   Tobacco Use  . Smoking status: Never  . Smokeless tobacco: Never  Vaping Use  . Vaping status: Never Used  Substance Use Topics  . Alcohol use: Not Currently    Alcohol/week: 7.0 standard drinks of alcohol    Types: 7 Glasses of wine per week  . Drug use: Yes    Comment: wine at night    Family History  Problem Relation Age of Onset  . Hypertension Father   . Osteoporosis  Other   . Breast cancer Neg Hx     No Known Allergies  Health Maintenance  Topic Date Due  . INFLUENZA VACCINE  01/13/2023  . COVID-19 Vaccine (11 - 2023-24 season) 02/13/2023  . Medicare Annual Wellness (AWV)  03/03/2023  . Fecal DNA (Cologuard)  10/11/2024  . MAMMOGRAM  11/16/2024  . DTaP/Tdap/Td (3 - Td or Tdap) 04/17/2032  . Pneumonia Vaccine 46+ Years old  Completed  . DEXA SCAN  Completed  . Hepatitis C Screening  Completed  . Zoster Vaccines- Shingrix  Completed  . HPV VACCINES  Aged Out  . Colonoscopy  Discontinued    Objective: Resp 16   Ht 5\' 4"  (1.626 m)   BMI 19.05 kg/m    Physical Exam Constitutional:      Appearance: Normal appearance.  HENT:     Head: Normocephalic and atraumatic.      Mouth: Mucous membranes are moist.  Eyes:    Conjunctiva/sclera: Conjunctivae normal.     Pupils:  Cardiovascular:     Rate and Rhythm: Normal rate and regular rhythm.     Heart sounds:  Pulmonary:     Effort: Pulmonary effort is normal on RA    Breath sounds:   Abdominal:     General: Non distended     Palpations: soft. SPC with a urobag - OK with no concerns   Musculoskeletal:        General: sitting in a wheel chair   Skin:    General: Skin is warm and dry.     Comments:  Neurological:     General: quadriplegia     Mental Status: awake, alert and oriented to person, place, and time.   Psychiatric:        Mood and Affect: Mood normal.   Lab Results Lab Results   Component Value Date   WBC 6.7 11/02/2022   HGB 12.8 11/02/2022   HCT 38.9 11/02/2022   MCV 94.6 11/02/2022   PLT 235 11/02/2022    Lab Results  Component Value Date   CREATININE 0.36 (L) 11/02/2022   BUN 28 (H) 11/02/2022   NA 138 11/02/2022   K 4.1 11/02/2022   CL 99 11/02/2022   CO2 33 (H) 11/02/2022    Lab Results  Component Value Date   ALT 29 11/02/2022   AST 25 11/02/2022   ALKPHOS 68 10/14/2022   BILITOT 0.3 11/02/2022    Lab Results  Component Value Date   CHOL 136 10/14/2022   HDL 53.80 10/14/2022   LDLCALC 70 10/14/2022   LDLDIRECT 162.3 03/21/2012   TRIG 64.0 10/14/2022   CHOLHDL 3 10/14/2022   No results found for: "LABRPR", "RPRTITER" No results found for: "HIV1RNAQUANT", "HIV1RNAVL", "CD4TABS"   CT chest 03/06/23 1. Extensive mucoid impaction within the right lower lobe with  associated cylindrical bronchiectasis, right lower lobe greater than  right middle lobe tree-in-bud nodularity and centrilobular groundglass  nodules, and right lower lobe dependent consolidation with small pleural  effusion. Constellation of imaging findings is most in keeping with an  infectious/inflammatory etiology, including however not limited to  atypical mycobacterial infection. Recommend correlation with  bronchoscopy. Chest CT follow-up within 1-3 months recommended.  2. Sub-5 mm nodular foci within the trachea may represent mucous/debris  however small tracheal polyps not excluded. This could be best  characterized with bronchoscopy.  3. Moderately advanced coronary artery calcification. Consider  correlation with dedicated coronary calcium scoring CT for risk  stratification as clinically warranted.  4. Sequela  of remote granulomatous disease.   Unless the patient's specific circumstances suggest otherwise, any liver  lesion 0.5 cm or less, any cystic kidney lesion less than 1.0 cm, and/or  any adrenal lesion 1.0 cm or less not otherwise characterized in this   report as possessing suspicious or indeterminate imaging features is/are  most likely to be benign and do not require follow-up imaging or biopsy.   Assessment/Plan # Recurrent UTI - continue methenamine and vitamin C as is. No UTIs since 10/09/2021 when she started taking methenamine and vit c - CBC and CMP from recent hospitalization in sept 2024 reviewed, no need for labs  - Fu in 5 months    # Incomplete Quadriplegia  # Neurogenic Bladder s/p SPC # Urinary Incontinence On Fesoterodine and Mirabegron Follows Urology   # Left buttock wound  - reports it is closed and healed   # bronchiectasis/multiple abnormal findings in CT chest  - needs to fu with Pulmonology    I have personally spent 44  minutes involved in face-to-face and non-face-to-face activities for this patient on the day of the visit. Professional time spent includes the following activities: Preparing to see the patient (review of tests), Obtaining and/or reviewing separately obtained history (admission/discharge record), Performing a medically appropriate examination and/or evaluation , Ordering medications/tests/procedures, referring and communicating with other health care professionals, Documenting clinical information in the EMR, Independently interpreting results (not separately reported), Communicating results to the patient/family/caregiver, Counseling and educating the patient/family/caregiver and Care coordination (not separately reported).   Victoriano Lain, MD [facility] 03/15/2023, 3:28 PM

## 2023-03-16 ENCOUNTER — Encounter: Payer: Self-pay | Admitting: "Endocrinology

## 2023-03-16 ENCOUNTER — Ambulatory Visit: Payer: Medicare PPO | Admitting: "Endocrinology

## 2023-03-16 VITALS — BP 103/63 | HR 89 | Ht 64.0 in | Wt 106.0 lb

## 2023-03-16 DIAGNOSIS — M810 Age-related osteoporosis without current pathological fracture: Secondary | ICD-10-CM | POA: Diagnosis not present

## 2023-03-16 DIAGNOSIS — S31829D Unspecified open wound of left buttock, subsequent encounter: Secondary | ICD-10-CM | POA: Insufficient documentation

## 2023-03-16 MED ORDER — TERIPARATIDE 600 MCG/2.4ML ~~LOC~~ SOPN: 20.0000 ug | PEN_INJECTOR | Freq: Every day | SUBCUTANEOUS | 11 refills | Status: DC
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
## 2023-03-18 ENCOUNTER — Telehealth: Payer: Self-pay

## 2023-03-18 ENCOUNTER — Telehealth: Payer: Self-pay | Admitting: Internal Medicine

## 2023-03-18 NOTE — Telephone Encounter (Signed)
Pt was in hospital with pneumonia was treated with IV antibiotics, completed 7 days of augmenten, still has fevers, 101.1 last night, Wednesday had episode of severe fatigue and weakness, seeking guidance on what can be done. Aware that provider is OOO until Tuesday and asks that someone else assist if possible

## 2023-03-18 NOTE — Telephone Encounter (Signed)
Carmen Ghee, LPN called stating patient is feeling very fatigue and weak. Patient had a fever last night 99.3 and this morning 100.1.  [name] advised to take patient to the ED.  Tully Burgo T Pricilla Loveless

## 2023-03-20 ENCOUNTER — Other Ambulatory Visit: Payer: Self-pay | Admitting: Family

## 2023-03-21 ENCOUNTER — Encounter: Payer: Self-pay | Admitting: Internal Medicine

## 2023-03-21 ENCOUNTER — Other Ambulatory Visit: Payer: Self-pay | Admitting: Family

## 2023-03-21 DIAGNOSIS — G8254 Quadriplegia, C5-C7 incomplete: Secondary | ICD-10-CM | POA: Diagnosis not present

## 2023-03-21 DIAGNOSIS — N319 Neuromuscular dysfunction of bladder, unspecified: Secondary | ICD-10-CM | POA: Diagnosis not present

## 2023-03-21 DIAGNOSIS — R338 Other retention of urine: Secondary | ICD-10-CM | POA: Diagnosis not present

## 2023-03-24 ENCOUNTER — Encounter: Payer: Self-pay | Admitting: "Endocrinology

## 2023-03-24 ENCOUNTER — Other Ambulatory Visit: Payer: Self-pay

## 2023-03-24 DIAGNOSIS — M810 Age-related osteoporosis without current pathological fracture: Secondary | ICD-10-CM

## 2023-03-24 MED ORDER — TERIPARATIDE 600 MCG/2.4ML ~~LOC~~ SOPN
20.0000 ug | PEN_INJECTOR | Freq: Every day | SUBCUTANEOUS | 11 refills | Status: DC
Start: 2023-03-24 — End: 2023-03-28

## 2023-03-25 NOTE — Telephone Encounter (Signed)
Reach out to pharmacy yesterday and spoke to [place]. She didn't clarify which dose is the correct one pt is taking. She stated 5mg  was last filled by Dr. Aneta Mins back in January and 10 mg in May.    Spoke to pt. Pt reports 5mg  is the correct dose for her and she is taking 1/2 tab of 5mg . Continues that 10 mg is too strong her and she can't quartered the pill.   Please advise.

## 2023-03-26 ENCOUNTER — Other Ambulatory Visit: Payer: Self-pay | Admitting: Family

## 2023-03-26 MED ORDER — ZOLPIDEM TARTRATE 5 MG PO TABS: 2.5000 mg | ORAL_TABLET | Freq: Every evening | ORAL | 0 refills | Status: DC | PRN

## 2023-03-28 ENCOUNTER — Encounter: Payer: Self-pay | Admitting: Podiatry

## 2023-03-28 ENCOUNTER — Other Ambulatory Visit: Payer: Self-pay | Admitting: "Endocrinology

## 2023-03-28 MED ORDER — TERIPARATIDE 600 MCG/2.4ML ~~LOC~~ SOPN: 20.0000 ug | PEN_INJECTOR | Freq: Every day | SUBCUTANEOUS | 5 refills | Status: DC

## 2023-03-31 ENCOUNTER — Other Ambulatory Visit: Payer: Self-pay

## 2023-03-31 DIAGNOSIS — M810 Age-related osteoporosis without current pathological fracture: Secondary | ICD-10-CM

## 2023-03-31 MED ORDER — TERIPARATIDE 600 MCG/2.4ML ~~LOC~~ SOPN: 20.0000 ug | PEN_INJECTOR | Freq: Every day | SUBCUTANEOUS | 5 refills | Status: DC

## 2023-04-04 ENCOUNTER — Ambulatory Visit: Payer: Medicare PPO | Attending: Physical Medicine and Rehabilitation | Admitting: Physical Therapy

## 2023-04-04 ENCOUNTER — Ambulatory Visit: Payer: Medicare PPO | Admitting: Occupational Therapy

## 2023-04-04 ENCOUNTER — Encounter: Payer: Self-pay | Admitting: Physical Therapy

## 2023-04-04 ENCOUNTER — Other Ambulatory Visit: Payer: Self-pay

## 2023-04-04 ENCOUNTER — Encounter: Payer: Self-pay | Admitting: Occupational Therapy

## 2023-04-04 DIAGNOSIS — R293 Abnormal posture: Secondary | ICD-10-CM | POA: Insufficient documentation

## 2023-04-04 DIAGNOSIS — M24542 Contracture, left hand: Secondary | ICD-10-CM

## 2023-04-04 DIAGNOSIS — R2689 Other abnormalities of gait and mobility: Secondary | ICD-10-CM | POA: Diagnosis not present

## 2023-04-04 DIAGNOSIS — G8254 Quadriplegia, C5-C7 incomplete: Secondary | ICD-10-CM | POA: Diagnosis not present

## 2023-04-04 DIAGNOSIS — R208 Other disturbances of skin sensation: Secondary | ICD-10-CM | POA: Diagnosis present

## 2023-04-04 DIAGNOSIS — G8253 Quadriplegia, C5-C7 complete: Secondary | ICD-10-CM | POA: Diagnosis not present

## 2023-04-04 DIAGNOSIS — M6281 Muscle weakness (generalized): Secondary | ICD-10-CM | POA: Insufficient documentation

## 2023-04-04 DIAGNOSIS — R29818 Other symptoms and signs involving the nervous system: Secondary | ICD-10-CM | POA: Diagnosis not present

## 2023-04-04 DIAGNOSIS — M24541 Contracture, right hand: Secondary | ICD-10-CM | POA: Insufficient documentation

## 2023-04-04 DIAGNOSIS — R29898 Other symptoms and signs involving the musculoskeletal system: Secondary | ICD-10-CM | POA: Diagnosis not present

## 2023-04-04 DIAGNOSIS — R278 Other lack of coordination: Secondary | ICD-10-CM | POA: Diagnosis not present
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
## 2023-04-05 ENCOUNTER — Other Ambulatory Visit: Payer: Self-pay

## 2023-04-05 ENCOUNTER — Ambulatory Visit: Payer: Medicare PPO | Admitting: Podiatry

## 2023-04-05 ENCOUNTER — Ambulatory Visit: Payer: Medicare PPO | Admitting: Infectious Diseases

## 2023-04-05 DIAGNOSIS — B351 Tinea unguium: Secondary | ICD-10-CM

## 2023-04-05 DIAGNOSIS — M79674 Pain in right toe(s): Secondary | ICD-10-CM | POA: Diagnosis not present

## 2023-04-05 DIAGNOSIS — M810 Age-related osteoporosis without current pathological fracture: Secondary | ICD-10-CM

## 2023-04-05 DIAGNOSIS — M79675 Pain in left toe(s): Secondary | ICD-10-CM

## 2023-04-05 MED ORDER — TERIPARATIDE 600 MCG/2.4ML ~~LOC~~ SOPN: 20.0000 ug | PEN_INJECTOR | Freq: Every day | SUBCUTANEOUS | 5 refills | Status: DC

## 2023-04-05 NOTE — Progress Notes (Signed)
  Subjective:  Patient ID: [name], female    DOB: 08-07-1950,  MRN: [id number]  No chief complaint on file.   72 y.o. female presents with the above complaint. History confirmed with patient.  Has not had any recurrence of wounds but there are pressure spots that seem to be getting pressure mostly around the heel.  She has as well thickening and elongation of the nails with yellow discoloration they are unable to cut them effectively, the left great toenail is improving in alignment and thickening and prior debridements have been helpful  Objective:  Physical Exam: Edema has improved quite a bit.  Still some cyanosis and pulses are palpable.  Yellowed elongated thickened nails subungual debris and dystrophy x 10.  No active ulceration or signs of infection Assessment:   1. Pain due to onychomycosis of toenails of both feet        Plan:  Patient was evaluated and treated and all questions answered.  Discussed the etiology and treatment options for the condition in detail with the patient. Recommended debridement of the nails today. Sharp and mechanical debridement performed of all painful and mycotic nails today. Nails debrided in length and thickness using a nail nipper to level of comfort. Discussed treatment options including appropriate shoe gear. Follow up as needed for painful nails.  Remainder of foot is well-perfused and healing well she does have chronic skin temperature and color changes with a Raynaud's type picture but macrovascular circulation is adequate and they are doing an excellent job of offloading her pressure points to prevent and reduce ulceration they will continue to monitor and let me know if there are any issues.  Left hallux medial border had a slight incurvated portion it did have some bleeding on debridement, triple antibiotic ointment and gauze bandage was applied should heal uneventfully, they will notify me if it does not  Return if symptoms worsen  or fail to improve.

## 2023-04-08 NOTE — Telephone Encounter (Signed)
Wasn't aware of this message.   Spoke to pt today to follow up.   Pt states she didn't go to the urgent care then. She was doing fine and they had monitor her sx closely.

## 2023-04-11 ENCOUNTER — Other Ambulatory Visit: Payer: Self-pay

## 2023-04-11 DIAGNOSIS — M810 Age-related osteoporosis without current pathological fracture: Secondary | ICD-10-CM

## 2023-04-11 MED ORDER — TERIPARATIDE 600 MCG/2.4ML ~~LOC~~ SOPN: 20.0000 ug | PEN_INJECTOR | Freq: Every day | SUBCUTANEOUS | 5 refills | Status: AC

## 2023-04-13 ENCOUNTER — Ambulatory Visit: Payer: Medicare PPO | Admitting: Physical Therapy

## 2023-04-13 ENCOUNTER — Ambulatory Visit: Payer: Medicare PPO | Admitting: Occupational Therapy

## 2023-04-13 DIAGNOSIS — M24542 Contracture, left hand: Secondary | ICD-10-CM | POA: Diagnosis not present

## 2023-04-13 DIAGNOSIS — R2689 Other abnormalities of gait and mobility: Secondary | ICD-10-CM | POA: Diagnosis not present

## 2023-04-13 DIAGNOSIS — G8254 Quadriplegia, C5-C7 incomplete: Secondary | ICD-10-CM

## 2023-04-13 DIAGNOSIS — R278 Other lack of coordination: Secondary | ICD-10-CM | POA: Diagnosis not present

## 2023-04-13 DIAGNOSIS — M6281 Muscle weakness (generalized): Secondary | ICD-10-CM

## 2023-04-13 DIAGNOSIS — R29898 Other symptoms and signs involving the musculoskeletal system: Secondary | ICD-10-CM | POA: Diagnosis not present

## 2023-04-13 DIAGNOSIS — R293 Abnormal posture: Secondary | ICD-10-CM

## 2023-04-13 DIAGNOSIS — R29818 Other symptoms and signs involving the nervous system: Secondary | ICD-10-CM | POA: Diagnosis not present

## 2023-04-13 DIAGNOSIS — G8253 Quadriplegia, C5-C7 complete: Secondary | ICD-10-CM | POA: Diagnosis not present
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
## 2023-04-13 NOTE — Therapy (Signed)
OUTPATIENT PHYSICAL THERAPY NEURO TREATMENT   Patient Name: [name] MRN: [id number] DOB:[birth year], 72 y.o., female Today's Date: 04/13/2023   PCP: [name], MD REFERRING PROVIDER: Maryjane Hurter, MD  END OF SESSION:   PT End of Session - 04/13/23 1109     Visit Number 2    Number of Visits 17   16 + eval   Date for PT Re-Evaluation 06/10/23   to allow for scheduling delays   Authorization Type [employer]ICARE    Progress Note Due on Visit 10    PT Start Time 1105   from OT session   PT Stop Time 1148    PT Time Calculation (min) 43 min    Equipment Utilized During Treatment Gait belt    Activity Tolerance Patient tolerated treatment well    Behavior During Therapy [facility] for tasks assessed/performed              Past Medical History:  Diagnosis Date   CERVICAL POLYP 03/11/2008   Qualifier: Diagnosis of  By: [name] MD, [name]    Colon polyps 2005   on colonscopy [name]   Fibroid 2004   Per Dr. [name]   History of shingles    face and mouth   Hx of skin cancer, basal cell    Rosacea    Sciatica of left side 09/28/2013   Scoliosis    noted on mri done for back pain   Past Surgical History:  Procedure Laterality Date   BUNIONECTOMY     Patient Active Problem List   Diagnosis Date Noted   Buttock wound, left, subsequent encounter 03/16/2023   Bronchiectasis with acute exacerbation (HCC) 03/15/2023   Buttock wound, left, initial encounter 11/02/2022   Orthostatic hypotension 08/13/2022   Neurogenic bowel 05/03/2022   Spasticity 05/03/2022   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/03/2022   Nerve pain 05/03/2022   Medication monitoring encounter 01/08/2022   Neurogenic bladder 10/11/2021   Urinary incontinence 10/11/2021   ESBL (extended spectrum beta-lactamase) producing bacteria infection 10/09/2021   Recurrent UTI 10/09/2021   Quadriplegia, C5-C7 incomplete (HCC) 01/16/2021   History of spinal fracture 01/16/2021   Suprapubic catheter (HCC)  01/16/2021   Encounter for routine gynecological examination 09/28/2013   Onychomycosis 09/28/2013   Foot deformity, acquired 03/26/2012   Encounter for preventive health examination 12/25/2010   ROSACEA 08/25/2009   Disturbance in sleep behavior 03/11/2008   SKIN CANCER, HX OF 03/11/2008   DYSURIA, HX OF 03/11/2008   Hyperlipidemia 02/10/2007   CERVICALGIA 02/10/2007    ONSET DATE: 03/11/2023 (most recent referral)  REFERRING DIAG: G82.54 (ICD-10-CM) - Quadriplegia, C5-C7 incomplete  THERAPY DIAG:  Muscle weakness (generalized)  Quadriplegia, C5-C7 incomplete (HCC)  Other symptoms and signs involving the nervous system  Abnormal posture  Rationale for Evaluation and Treatment: Rehabilitation  SUBJECTIVE:  SUBJECTIVE STATEMENT: Pt denies any acute changes since last visit, still working on building her strength back after having pneumonia. Pt reports she is wearing an abdominal binder now per her SLP and PT at [facility] for improved breath support.  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 Yes: NPRS scale: 3/10 Pain location: forearms to fingertips Pain description: constant, pinprick/tingling Aggravating factors: nighttime Relieving factors: nothing, sometimes medicines  PRECAUTIONS: Fall; suprapubic catheter (she wants to get this removed meaning she needs to get to and from the toilet); she has had minor heat sensation when needing to complete her bowel program-possible AD?; pt has recently returned to wearing binder since pneumonia due to breathing and core stability.  Pt/caregiver performs bladder clamping prior to transfers as pt is still doing  bladder training.  WEIGHT BEARING RESTRICTIONS: No-Follow fall precautions, adequate dairy in diet and exercises (aerobic, balancing and weight bearing) as tolerated. - most recent endocrinologist note.  FALLS: Has patient fallen in last 6 months? No  LIVING ENVIRONMENT: Lives with: lives with their spouse and live-in nurse [name] Lives in: House/apartment-townhouse Stairs: No-level entry, but multi-level home 4 stories w/ elevator Has following equipment at home: Wheelchair (power), Wheelchair (manual), Grab bars, and standing frame, sliding board, transport shower chair, handheld shower head, leg strap-pt reports she no longer finds this helpful  PLOF: Requires assistive device for independence, Needs assistance with ADLs, Needs assistance with homemaking, and Needs assistance with transfers  OCCUPATION:  Writer-uses Dragon to dictate  PATIENT GOALS: "Make my right leg work."  Stand and pivot so she can more easily access a toilet.  OBJECTIVE:   DIAGNOSTIC FINDINGS:  ASSESSMENT: The BMD measured at Forearm Radius 33% is 0.506 g/cm2 with a T-score of -4.2. This patient is considered osteoporotic according to World Health Organization [facility]) criteria.  COGNITION: Overall cognitive status: Within functional limits for tasks assessed   SENSATION: Light touch: Diminished ability to distinguish sharp and dull, but able to distinguish light touch from injury level down accurately  COORDINATION: Not formally assessed.  EDEMA:  Well managed w/ lymphatic massage and compression stockings.  MUSCLE TONE: Pt has intermittent clonus during transfers.  POSTURE: rounded shoulders, posterior pelvic tilt, and right thoracic scoliosis  LOWER EXTREMITY ROM:     Passive  Right Eval Left Eval  Hip flexion See below.  Hip extension   Hip abduction   Hip adduction   Hip internal rotation   Hip external rotation   Knee flexion   Knee extension   Ankle dorsiflexion   Ankle  plantarflexion   Ankle inversion   Ankle eversion    (Blank rows = not tested)  LOWER EXTREMITY MMT:    MMT Right Eval Left Eval  Hip flexion Left knee extension almost to neutral.  Left DF just passed neutral.  Hip extension   Hip abduction   Hip adduction   Hip internal rotation   Hip external rotation   Knee flexion   Knee extension   Ankle dorsiflexion   Ankle plantarflexion   Ankle inversion   Ankle eversion   (Blank rows = not tested)  BED MOBILITY:  Sit to supine Mod A Supine to sit Mod A Rolling to Right Mod A Rolling to Left Mod A Undulating mattress for wound management on standard bed (elevated-so often doing uphill sliding board transfers); she would like to  continue working on sitting up independently, she has been working on rolling, needs less assistance w/ this when someone props her leg into hooklying; would like something to help her pull her left leg to her butt for stretching as well as bed mobility.  TRANSFERS: Pt continues using combination of bump over, slide board, and depression (squat pivot) transfers.   FUNCTIONAL TESTS:  None relevant to pt's current functional level and abilities.  PATIENT SURVEYS:  None completed due to time.  TODAY'S TREATMENT:                                                                                                                               NMR Pt received seated in PWC. "Bump"/scapular depression transfer PWC to/from mat table with min to mod A with assist provided at posterior thighs to assist with lifting.  Sit to stand to stedy x 4 reps (one from elevated mat, 3 from perched stedy seat), focus on glute and quad activation for upright posture. Pt does fatigue quickly, needs min to mod A to stand from stedy seat, max A from mat table. Pt able to tolerate less than 30 sec each round due to fatigue.  To work on dynamic sitting balance, reaching outside BOS, and core stabilization: 4 Blaze pods on random setting  while pt seated EOM with 2 pods on her L and 2 on her R on mat table. Performed on 1 minute intervals with 30 rest periods.  Pt requires min A guarding. Round 1:  25 hits. Round 2:  26 hits. Round 3:  29 hits. Notable errors/deficits:  one posterior and to the L LOB during 2nd round, requires max A to recover and return to sitting upright 6 blaze pods (2 on each side, 2 anteriorly on table) 6 Blaze pods on random setting as noted above with 2 additional pods placed anteriorly to patient on high/low table, min A for trunk control Round 1:  6 hits. Round 2:  29 hits. Round 3:  34 hits. 6 Blaze pods on random setting as noted above with 2 pods placed at a diagonal on chairs on her R and L side, min-mod A for trunk control Round 1:  20 hits. Round 2:  32 hits. Round 3:  29 hits. Pt does have to brace herself with LUE on table anteriorly to reach diagonally to the R outside BOS due to weak core stability, increased physical assist needed for this configuration    PATIENT EDUCATION: Education details: PT POC and continue to work on standing frame at home Person educated: Patient and Arts administrator Education method: Explanation Education comprehension: verbalized understanding  HOME EXERCISE PROGRAM: Will be established as needed as pt has done continuous therapy and is working towards functional tasks.  GOALS: Goals reviewed with patient? Yes  SHORT TERM GOALS: Target date: 05/06/2023  HEP to be established for stretching and strengthening as needed. Baseline:  To be established. Goal status:  INITIAL  2.  Pt will be able to perform rolling L and R w/ no more than minA in order to improve functional mobility. Baseline: modA Goal status: INITIAL  3.  Pt will perform sit<>supine requiring no more than minA in order to improve functional mobility. Baseline: modA Goal status: INITIAL  4.  Pt will perform bilateral reach outside seated BOS with feet supported without LOB in order to  improve safety with ADL performance. Baseline: lateral LOB bilaterally demonstrated in w/c Goal status: INITIAL  LONG TERM GOALS: Target date: 06/03/2023  Pt will perform squat pivot transfer w/ no more than modA in order to improve access to home environment for toilet transfers. Baseline: pt reports recent practice with depression transfers Goal status: INITIAL  2.  Pt will perform sit<>supine requiring no more than supervision in order to improve transfers in and out of bed. Baseline:  modA Goal status: INITIAL  3.  Aquatic HEP to be established with caregiver assistance as appropriate. Baseline:  To be established. Goal status: INITIAL  4.  Pt will be able to perform rolling L and R w/ no more than supervision in order to improve functional mobility. Baseline: modA Goal status: INITIAL  5.  Pt will perform stand pivot transfer w/ no more than modA in order to improve toilet transfer for ongoing bladder training. Baseline:  To be assessed. Goal status: INITIAL  6.  Pt will stand from no more than 20 inch seat height with no more than modA to initiate transfer with maintained stand for >/=5 minutes in order to improve trunk and LE strength and stability for functional mobility and transfers. Baseline: To be assessed. Goal status: INITIAL  ASSESSMENT:  CLINICAL IMPRESSION: Emphasis of skilled PT session on working on assessing strength and endurance for standing in stedy as well as working on core strengthening/stability with focus on reaching outside BOS with decreased UE support. Pt exhibits improved ability to stand upright as task progresses with increased glute and quad activation. Pt does remain limited by fatigue and only tolerates standing less than 30 sec each round. Pt is able to reach laterally outside BOS with minimal UE support and only CGA to min A needed for safety, with increase in challenge to reaching anteriorly and anteriolaterally outside BOS pt does require UE  support and increased physical assist to min to mod A for stability. Pt continues to benefit from skilled therapy services to work towards LTGs and increasing safety and independence with functional mobility. Continue POC.   OBJECTIVE IMPAIRMENTS: decreased balance, decreased coordination, decreased mobility, difficulty walking, decreased ROM, decreased strength, hypomobility, increased edema, impaired flexibility, impaired sensation, impaired tone, impaired UE functional use, improper body mechanics, postural dysfunction, and pain.   ACTIVITY LIMITATIONS: carrying, lifting, bending, sitting, standing, squatting, stairs, transfers, bed mobility, continence, bathing, toileting, dressing, reach over head, hygiene/grooming, locomotion level, and caring for others  PARTICIPATION LIMITATIONS: meal prep, cleaning, laundry, driving, and community activity  PERSONAL FACTORS: Age, Fitness, Past/current experiences, Time since onset of injury/illness/exacerbation, and 1-2 comorbidities: intermittent AD, neurogenic bowel/bladder on active bladder training, osteoporosis  are also affecting patient's functional outcome.   REHAB POTENTIAL: Good  CLINICAL DECISION MAKING: Evolving/moderate complexity  EVALUATION COMPLEXITY: Moderate  PLAN:  PT FREQUENCY: 2x/week (1x/wk land and 1x/wk in pool)  PT DURATION: 8 weeks  PLANNED INTERVENTIONS: [id number]- PT Re-evaluation, 97110-Therapeutic exercises, 97530- Therapeutic activity, [id number]- Neuromuscular re-education, 97535- Self Care, [id number]- Manual therapy, [id number]- Gait training, [phone]- Orthotic Fit/training, [id number]- Aquatic Therapy, [phone]- Electrical stimulation (  unattended), [phone]- Electrical stimulation (manual), Patient/Family education, Balance training, Dry Needling, DME instructions, Wheelchair mobility training, Moist heat, Therapeutic exercises, Therapeutic activity, Neuromuscular re-education, Gait training, and Self Care  PLAN FOR NEXT SESSION:  Multilevel  slide board transfers vs stand pivots.  Core strength, seated reaching/trunk righting.  Bed mobility.  Use stander to practice weight-bearing and core as tolerated.  Practice stands from progressively lowered surface. Standing with LRAD.  Left shoulder and periscapular TPDN?  Pt was discussing this at [facility]. Pt will switch to leg bag for aquatics as she used at [place]. Pt may be going to [place] for prolonged period in November (probably not until Thanksgiving)  Peter Congo, PT Peter Congo, PT, DPT, CSRS  04/13/2023, 11:48 AM

## 2023-04-15 ENCOUNTER — Ambulatory Visit: Payer: Medicare PPO | Attending: Internal Medicine | Admitting: Physical Therapy

## 2023-04-15 ENCOUNTER — Ambulatory Visit: Payer: Medicare PPO | Admitting: Occupational Therapy

## 2023-04-15 DIAGNOSIS — G8253 Quadriplegia, C5-C7 complete: Secondary | ICD-10-CM | POA: Insufficient documentation

## 2023-04-15 DIAGNOSIS — R278 Other lack of coordination: Secondary | ICD-10-CM | POA: Insufficient documentation

## 2023-04-15 DIAGNOSIS — R293 Abnormal posture: Secondary | ICD-10-CM | POA: Diagnosis not present

## 2023-04-15 DIAGNOSIS — G8254 Quadriplegia, C5-C7 incomplete: Secondary | ICD-10-CM | POA: Diagnosis not present

## 2023-04-15 DIAGNOSIS — R29818 Other symptoms and signs involving the nervous system: Secondary | ICD-10-CM | POA: Insufficient documentation

## 2023-04-15 DIAGNOSIS — R208 Other disturbances of skin sensation: Secondary | ICD-10-CM | POA: Insufficient documentation

## 2023-04-15 DIAGNOSIS — R2689 Other abnormalities of gait and mobility: Secondary | ICD-10-CM | POA: Insufficient documentation

## 2023-04-15 DIAGNOSIS — M6281 Muscle weakness (generalized): Secondary | ICD-10-CM | POA: Insufficient documentation

## 2023-04-15 DIAGNOSIS — M24541 Contracture, right hand: Secondary | ICD-10-CM | POA: Diagnosis not present

## 2023-04-15 DIAGNOSIS — M24542 Contracture, left hand: Secondary | ICD-10-CM | POA: Diagnosis present

## 2023-04-15 DIAGNOSIS — R29898 Other symptoms and signs involving the musculoskeletal system: Secondary | ICD-10-CM | POA: Diagnosis not present

## 2023-04-15 NOTE — Therapy (Signed)
OUTPATIENT OCCUPATIONAL THERAPY NEURO TREATMENT  Patient Name: [name] MRN: [id number] DOB:March 28, 1951, 72 y.o., female Today's Date: 04/15/2023  PCP: [name], MD  REFERRING PROVIDER: Maryjane Hurter, MD   END OF SESSION:  OT End of Session - 04/15/23 1250     Visit Number 3    Number of Visits 17    Date for OT Re-Evaluation 06/10/23    Authorization Type [employer] - requires auth, [place]    Progress Note Due on Visit 10    OT Start Time 1103    OT Stop Time 1145    OT Time Calculation (min) 42 min    Equipment Utilized During Treatment Personal Estim unit    Activity Tolerance Patient tolerated treatment well    Behavior During Therapy [facility] for tasks assessed/performed              Past Medical History:  Diagnosis Date   CERVICAL POLYP 03/11/2008   Qualifier: Diagnosis of  By: [name] MD, [name]    Colon polyps 2005   on colonscopy [name]   Fibroid 2004   Per Dr. [name]   History of shingles    face and mouth   Hx of skin cancer, basal cell    Rosacea    Sciatica of left side 09/28/2013   Scoliosis    noted on mri done for back pain   Past Surgical History:  Procedure Laterality Date   BUNIONECTOMY     Patient Active Problem List   Diagnosis Date Noted   Buttock wound, left, subsequent encounter 03/16/2023   Bronchiectasis with acute exacerbation (HCC) 03/15/2023   Buttock wound, left, initial encounter 11/02/2022   Orthostatic hypotension 08/13/2022   Neurogenic bowel 05/03/2022   Spasticity 05/03/2022   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/03/2022   Nerve pain 05/03/2022   Medication monitoring encounter 01/08/2022   Neurogenic bladder 10/11/2021   Urinary incontinence 10/11/2021   ESBL (extended spectrum beta-lactamase) producing bacteria infection 10/09/2021   Recurrent UTI 10/09/2021   Quadriplegia, C5-C7 incomplete (HCC) 01/16/2021   History of spinal fracture 01/16/2021   Suprapubic catheter (HCC) 01/16/2021   Encounter for  routine gynecological examination 09/28/2013   Onychomycosis 09/28/2013   Foot deformity, acquired 03/26/2012   Encounter for preventive health examination 12/25/2010   ROSACEA 08/25/2009   Disturbance in sleep behavior 03/11/2008   SKIN CANCER, HX OF 03/11/2008   DYSURIA, HX OF 03/11/2008   Hyperlipidemia 02/10/2007   CERVICALGIA 02/10/2007    ONSET DATE: 07/28/2020  Date of Referral 03/23/2023  REFERRING DIAG: G82.54 (ICD-10-CM) - Quadriplegia, C5-C7, incomplete (HCC)  THERAPY DIAG:  Other lack of coordination  Muscle weakness (generalized)  Rationale for Evaluation and Treatment: Rehabilitation  SUBJECTIVE:   SUBJECTIVE STATEMENT: At beginning of session, [name] applied electrodes for pt's personal e-stim device to improve UE use for FM tasks. Pt reports wearing splints every night though sometimes they fall off. Pt reports splints feel comfortable with some adjustments of how tight splint is over knuckles. Pt reports difficulty using rocker knife for cooking tasks to cut tougher foods, such as steak.  Pt accompanied by:  Live in Caregiver - [name]   PERTINENT HISTORY: "Pt is a 72 yr old L handed female with hx of incomplete quadriplegia- 2/14 2022- fleeing the police in [place] on passenger 100 (high speed) miles/hour,  [name]; neurogenic bowel and bladder and spasticity; [name], [name] Here for f/u on Incomplete quadriplegia"  PRECAUTIONS: Fall; suprapubic catheter (she wants  to get this removed meaning she needs to get to and from the toilet); she has had minor heat sensation when needing to complete her bowel program-possible AD?   WEIGHT BEARING RESTRICTIONS: No  PAIN: - reports average pain as noted below. Will notify therapist if there are changes in her pain.  Are you having pain? Yes: NPRS scale: 3/10 Pain location: fingers to elbow bilaterally Pain description: constant Aggravating factors: it can increased over time ie) is worse at the end  of the day.  Also cold affects cramps and function. Relieving factors: gabapentin and baclofen for spasms, nightly stretching  FALLS: Has patient fallen in last 6 months? No  LIVING ENVIRONMENT: Lives with: lives with their family - husband [name] and with an adult companion s/p moving back up from [place] x10 months Lives in: House/apartment Stairs:  4 story town house with an Engineer, structural with threshold adjustments, roll in shower with transport chair Has following equipment at home: Wheelchair (power) - with seat height adjustments to access counters and reclining option, Wheelchair (manual), transport WC, shower chair, and Ramped entry, handheld showerhead with rails around toilet, had Michiel Sites but is no longer in need of it, has slide boards x3  PLOF: Requires assistive device for independence, Needs assistance with ADLs, Needs assistance with homemaking, Needs assistance with gait, and Needs assistance with transfers; 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  Used to like to knit, sew and bake.  PATIENT GOALS: improve L shoulder pain, ROM, and strength; core strength  OBJECTIVE:   HAND DOMINANCE: Left  ADLs: Overall ADLs: Patient has a live in caregiver  Transfers/ambulation related to ADLs: min assist with sliding board transfers.  Eating: Has a rocker knife that she can use. Used to use adapted utensils but now uses regular utensils but still will get assistance to cut food ie) when eating out.  Grooming: can brush her own hair with LUE only; unable to manage jewelry ie) earrings  UB Dressing: can zip/unzip after it has been started, unable to manage buttons herself, Caregiver assists but if she has extra time, she can put on her bra, and a loose fitting pullover shirt/t-shirt  LB Dressing: dependent for LB dressing in bed and with special sock donner for LE compression garments   Toileting: bladder trained with suprapubic catheter which she clamps off.  Dependent for  bowel incontinence care.  Bathing: Sponge bath with adult washclothes.  Can bathe UB with back scrubber for most of her back.  Needs help with feet (mentioned she might need a separate brush for feet)   Tub Shower transfers: Min assist with slide board  Equipment: Shower seat with back, Walk in shower, bed side commode, Reacher, Sock aid, Long handled sponge, and Feeding equipment  IADLs: --  Shopping: Assisted by caregiver  Light housekeeping: Has housekeeper that comes monthly  Meal Prep: previously enjoyed baking. Assisted by caregiver but has reheated a meal for herself after getting food out of the fridge/freezer from her WC.  Community mobility: Dependent  Medication management: Caregiver sorts them into pillbox but she is very aware of her medications   Financial management: Patient manages her own finances  Handwriting: Increased time and has a pen with a little grip  MOBILITY STATUS: Independent with power mobility  ACTIVITY TOLERANCE: Activity tolerance: good to Fair - MMT WFL but has limited sustained tolerance for ongoing use of Ues with poor trunk control  FUNCTIONAL OUTCOME MEASURES:   Total score = sum of  the activity scores/number of activities Minimum detectable change (90%CI) for average score = 2 points Minimum detectable change (90%CI) for single activity score = 3 points   UPPER EXTREMITY ROM:   AROM - WFL without obvious contractures, some digital flexion noted but PROM WNL   UPPER EXTREMITY MMT:   Grossly WFL - Endurance limited R tricep strength > than L but L UE generally stronger than R UE  MMT Right (eval) Left (eval)  Shoulder flexion 4/5 4/5  Shoulder abduction 4/5 4/5  Elbow flexion 4/5 4/5  Elbow extension 4/5 4-/5  (Blank rows = not tested)  HAND FUNCTION: Grip strength: Right: 5.2 lbs; Left: 15.6 lbs  COORDINATION: 9 Hole Peg test: Right: able to place all 9 and remove 4 pegs in 3 minutes sec; Left: 84 sec R hand finger eventually  cramps and dexterity worsens in the cold  SENSATION: Light touch: Impaired  - patient   EDEMA: NA for UEs but LE has poor lymph drainage with custom compression garments   MUSCLE TONE: Generally WFL   COGNITION: Overall cognitive status: Within functional limits for tasks assessed  VISION: Subjective report: Patent wears progressive lens/glasses.  Denies diplopia or vision changes. Baseline vision: Wears glasses all the time  VISION ASSESSMENT: WFL  OBSERVATIONS: Patient independent with power WC navigation within clinic.  Patient is well-kept with foley catheter in place.  She has slight limitations in full extension of digits but PROM is WNL.    TODAY'S TREATMENT:                                                                                        Self-care Pt engaged in all OT tasks today while she had her personal TENS unit applied per pre-programmed settings from [facility].  Caregiver [name]) applied electrodes per her education at [employer] and pt able to increased the intensity on her own.  Device is set to Synchronous and pt reports pulse width needs to be between 29-50 [place].  She tolerated up to 30 for approx. 35 minutes during self-care/FM coordination tasks.  Adaptive equipment for feeding - to increase ind for ADL tasks, to improve FM coordination of BUE for functional tasks - OT educated pt on adaptive knife options to increase ind with feeding tasks. Pt verbalized understanding and trialed using a pizza cutter and rocker knife with RUE to cut progressively higher-resistance theraputty. OT educated pt on body mechanics, ergonomics, maintaining neutral wrist position when cutting, positioning knife above food as able for better leverage, moving food or plate to improve body mechanics when cutting, using non-slip placement, and supporting RUE under elbow with pillow or towel PRN to increase stability. Pt demo'd understanding of all. OT educated pt to have guarding device on  knife handle to increase safety and protect pt's skin integrity when cutting with adaptive knives. Pt verbalized understanding. Pt verbalized intent to bring personal adaptive knives next visit to practice cutting foods.  OT educated pt on Eazyhold Silicone strap universal cuff for stability and foam tubing to build up handle - to improve ind and stability when using fork for eating or hairbrush for grooming. Pt verbalized understanding and  trialed using Eazyhold silicone strap with built up handle of fork (foam tubing around handle). Pt stated that she felt improved ability to maintain grasp of fork with LUE and improved ability to stabilize food.   OT briefly educated pt on weight shift, such as shoulder flex towel slides on table or moving side to side to decrease pressure at pressure points and prevent skin breakdown. Pt verbalized understanding.  PATIENT EDUCATION: Education details: see today's treatment above Person educated: Patient and Caregiver Live in caregiver - [name] Education method: Explanation, Demonstration, and Verbal cues Education comprehension: verbalized understanding, returned demonstration, and needs further education  HOME EXERCISE PROGRAM: TBD  GOALS:   SHORT TERM GOALS: Target date: 05/06/2023    Patient will be able to use AE/modified techniques to cut soft foods small enough for oral intake. Baseline: Caregiver/spouse assist Goal status: INITIAL  2.  Pt will be independent with BUE braces/splints as needed to prevent contracture and improve functional use of hands.  Baseline: Caregiver/spouse assist; pt lacks carryover from [facility] Goal status: IN progress  3.  Pt will be independent with use of ARCEX device as needed for carryover and improvement of BUE function.  Baseline: Caregiver/spouse assist; unable to independently recall protocol for use Goal status: IN progress   LONG TERM GOALS: Target date: 06/10/23   Patient will demonstrate  independence with updated HEP for UE strengthening, coordination and ROM to prevent contractures and maintain strength for transfers and ADLs. Baseline: Previous HEPs have been established but need to be reviewed and updated.  Goal status: INITIAL  2.  Patient will report at least two-point increase in average PSFS score or at least three-point increase in a single activity score indicating functionally significant improvement given minimum detectable change.  Baseline: 0.5 total score (See above for individual activity scores)   3.  Pt will demonstrate ability to place 5lb item bimanually on table at least 1' in front of her without using support of elbows, without leaning against back of chair, and with good control.  Baseline: unable Goal status: INITIAL  4.  Pt will report increased ability to push up through LUE with completion of transfers. Baseline: unable Goal status: INITIAL   ASSESSMENT:  CLINICAL IMPRESSION: Pt tolerated tasks well and participated in discussions to find best ways to improve ind at home during functional eating tasks. Patient tolerated estim protocol well during continuous UE coordination activities. Pt will benefit from daily schedule of HEP ideas with estim, splints and casts with skilled OT services in the outpatient setting to work on impairments and progress HEP to progress pt to highest particiaption in self care, work and leisure activities.     PERFORMANCE DEFICITS: in functional skills including ADLs, IADLs, coordination, dexterity, strength, muscle spasms, Fine motor control, Gross motor control, continence, skin integrity, and UE functional use,   IMPAIRMENTS: are limiting patient from ADLs, IADLs, work, and leisure.   CO-MORBIDITIES: has co-morbidities such as incontinence and wound  that affects occupational performance. Patient will benefit from skilled OT to address above impairments and improve overall function.  REHAB POTENTIAL: Fair due to  chronicity of injury  PLAN:  OT FREQUENCY: 2x/week  OT DURATION: 8 weeks  PLANNED INTERVENTIONS: self care/ADL training, therapeutic exercise, therapeutic activity, neuromuscular re-education, manual therapy, passive range of motion, balance training, functional mobility training, splinting, patient/family education, energy conservation, coping strategies training, and DME and/or AE instructions  RECOMMENDED OTHER SERVICES: Patient was seen for PT evaluation today with treatment plans coordinated for  2x/week.  CONSULTED AND AGREED WITH PLAN OF CARE: Patient and family member/caregiver  PLAN FOR NEXT SESSION:  Establish weight shifting instruction for pressure relief - provide handout  Practice BUE positioning when using pt's personal rocker knife and adaptive knife for feeding tasks - pt to bring equipment  Will review splints/splint schedule with patient.   Initiate ROM HEP.   Explore FM tasks  Review EazyHold (silicone strap) for hair brush PRN  Wynetta Emery, OT 04/15/2023, 1:06 PM

## 2023-04-15 NOTE — Therapy (Signed)
OUTPATIENT PHYSICAL THERAPY NEURO TREATMENT   Patient Name: [name] MRN: [id number] DOB:[birth year], 72 y.o., female Today's Date: 04/15/2023   PCP: [name], MD REFERRING PROVIDER: Maryjane Hurter, MD  END OF SESSION:   PT End of Session - 04/15/23 1148     Visit Number 3    Number of Visits 17   16 + eval   Date for PT Re-Evaluation 06/10/23   to allow for scheduling delays   Authorization Type [employer] MEDICARE    Progress Note Due on Visit 10    PT Start Time 1149    PT Stop Time 1233    PT Time Calculation (min) 44 min    Equipment Utilized During Treatment Gait belt    Activity Tolerance Patient tolerated treatment well    Behavior During Therapy WFL for tasks assessed/performed               Past Medical History:  Diagnosis Date   CERVICAL POLYP 03/11/2008   Qualifier: Diagnosis of  By: [name] MD, [name]    Colon polyps 2005   on colonscopy [name]   Fibroid 2004   Per Dr. [name]   History of shingles    face and mouth   Hx of skin cancer, basal cell    Rosacea    Sciatica of left side 09/28/2013   Scoliosis    noted on mri done for back pain   Past Surgical History:  Procedure Laterality Date   BUNIONECTOMY     Patient Active Problem List   Diagnosis Date Noted   Buttock wound, left, subsequent encounter 03/16/2023   Bronchiectasis with acute exacerbation (HCC) 03/15/2023   Buttock wound, left, initial encounter 11/02/2022   Orthostatic hypotension 08/13/2022   Neurogenic bowel 05/03/2022   Spasticity 05/03/2022   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/03/2022   Nerve pain 05/03/2022   Medication monitoring encounter 01/08/2022   Neurogenic bladder 10/11/2021   Urinary incontinence 10/11/2021   ESBL (extended spectrum beta-lactamase) producing bacteria infection 10/09/2021   Recurrent UTI 10/09/2021   Quadriplegia, C5-C7 incomplete (HCC) 01/16/2021   History of spinal fracture 01/16/2021   Suprapubic catheter (HCC) 01/16/2021    Encounter for routine gynecological examination 09/28/2013   Onychomycosis 09/28/2013   Foot deformity, acquired 03/26/2012   Encounter for preventive health examination 12/25/2010   ROSACEA 08/25/2009   Disturbance in sleep behavior 03/11/2008   SKIN CANCER, HX OF 03/11/2008   DYSURIA, HX OF 03/11/2008   Hyperlipidemia 02/10/2007   CERVICALGIA 02/10/2007    ONSET DATE: 03/11/2023 (most recent referral)  REFERRING DIAG: G82.54 (ICD-10-CM) - Quadriplegia, C5-C7 incomplete  THERAPY DIAG:  Muscle weakness (generalized)  Quadriplegia, C5-C7 incomplete (HCC)  Abnormal posture  Rationale for Evaluation and Treatment: Rehabilitation  SUBJECTIVE:  SUBJECTIVE STATEMENT: Pt received seated in PWC from OT session, no acute changes since last visit.  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 Yes: NPRS scale: 3/10 Pain location: forearms to fingertips Pain description: constant, pinprick/tingling Aggravating factors: nighttime Relieving factors: nothing, sometimes medicines  PRECAUTIONS: Fall; suprapubic catheter (she wants to get this removed meaning she needs to get to and from the toilet); she has had minor heat sensation when needing to complete her bowel program-possible AD?; pt has recently returned to wearing binder since pneumonia due to breathing and core stability.  Pt/caregiver performs bladder clamping prior to transfers as pt is still doing bladder training.  WEIGHT BEARING RESTRICTIONS: No-Follow fall precautions, adequate dairy in diet and exercises (aerobic, balancing and weight bearing) as tolerated. - most recent endocrinologist note.  FALLS: Has patient  fallen in last 6 months? No  LIVING ENVIRONMENT: Lives with: lives with their spouse and live-in nurse [name] Lives in: House/apartment-townhouse Stairs: No-level entry, but multi-level home 4 stories w/ elevator Has following equipment at home: Wheelchair (power), Wheelchair (manual), Grab bars, and standing frame, sliding board, transport shower chair, handheld shower head, leg strap-pt reports she no longer finds this helpful  PLOF: Requires assistive device for independence, Needs assistance with ADLs, Needs assistance with homemaking, and Needs assistance with transfers  OCCUPATION:  Writer-uses Dragon to dictate  PATIENT GOALS: "Make my right leg work."  Stand and pivot so she can more easily access a toilet.  OBJECTIVE:   DIAGNOSTIC FINDINGS:  ASSESSMENT: The BMD measured at Forearm Radius 33% is 0.506 g/cm2 with a T-score of -4.2. This patient is considered osteoporotic according to World Health Organization [facility]) criteria.  COGNITION: Overall cognitive status: Within functional limits for tasks assessed   SENSATION: Light touch: Diminished ability to distinguish sharp and dull, but able to distinguish light touch from injury level down accurately  COORDINATION: Not formally assessed.  EDEMA:  Well managed w/ lymphatic massage and compression stockings.  MUSCLE TONE: Pt has intermittent clonus during transfers.  POSTURE: rounded shoulders, posterior pelvic tilt, and right thoracic scoliosis  LOWER EXTREMITY ROM:     Passive  Right Eval Left Eval  Hip flexion See below.  Hip extension   Hip abduction   Hip adduction   Hip internal rotation   Hip external rotation   Knee flexion   Knee extension   Ankle dorsiflexion   Ankle plantarflexion   Ankle inversion   Ankle eversion    (Blank rows = not tested)  LOWER EXTREMITY MMT:    MMT Right Eval Left Eval  Hip flexion Left knee extension almost to neutral.  Left DF just passed neutral.  Hip extension    Hip abduction   Hip adduction   Hip internal rotation   Hip external rotation   Knee flexion   Knee extension   Ankle dorsiflexion   Ankle plantarflexion   Ankle inversion   Ankle eversion   (Blank rows = not tested)  BED MOBILITY:  Sit to supine Mod A Supine to sit Mod A Rolling to Right Mod A Rolling to Left Mod A Undulating mattress for wound management on standard bed (elevated-so often doing uphill sliding board transfers); she would like to continue working on sitting up independently, she has been working on rolling, needs less assistance w/ this when someone props her leg into hooklying; would  like something to help her pull her left leg to her butt for stretching as well as bed mobility.  TRANSFERS: Pt continues using combination of bump over, slide board, and depression (squat pivot) transfers.   FUNCTIONAL TESTS:  None relevant to pt's current functional level and abilities.  PATIENT SURVEYS:  None completed due to time.  TODAY'S TREATMENT:                                                                                                                               NMR Pt received seated in PWC. "Bump"/scapular depression transfer PWC to/from mat table with min to mod A with assist provided at posterior thighs to assist with lifting. Sit to supine mod A needed for BLE management. Supine to prone with mod A needed for LLE management and RUE management. Prone press-ups 2 x 10 reps with focus on scapular strengthening. Prone to quadruped to tall-kneel over bench with assist x 3 (one SPT assisting at shoulders, one PT assisting to pull hips back, and [name] able to place bench under patient). Pt able to work on performing mini-squats from tall-kneel position x 5 reps with max A at hips. Pt able to maintain tall-kneeling position with heavy BUE support on bench and min to mod A at hips to maintain midline. Pt returns to quadruped then to prone with assist x 3 similar to as noted  above. Prone to supine with min A for LLE management. Supine to sit with max A for BLE management and trunk elevation. Pt left seated in PWC at end of session.   TherAct Trigger Point Dry-Needling  Treatment instructions: Expect mild to moderate muscle soreness. S/S of pneumothorax if dry needled over a lung field, and to seek immediate medical attention should they occur. Patient verbalized understanding of these instructions and education.  Patient Consent Given: Yes Education handout provided: No Muscles treated: L upper trap, L rhomboids Treatment response/outcome: deep ache/muscle cramp; muscle twitch detected     PATIENT EDUCATION: Education details: PT POC and continue to work on standing frame at home Person educated: Patient and Arts administrator Education method: Explanation Education comprehension: verbalized understanding  HOME EXERCISE PROGRAM: Will be established as needed as pt has done continuous therapy and is working towards functional tasks.  GOALS: Goals reviewed with patient? Yes  SHORT TERM GOALS: Target date: 05/06/2023  HEP to be established for stretching and strengthening as needed. Baseline:  To be established. Goal status: INITIAL  2.  Pt will be able to perform rolling L and R w/ no more than minA in order to improve functional mobility. Baseline: modA Goal status: INITIAL  3.  Pt will perform sit<>supine requiring no more than minA in order to improve functional mobility. Baseline: modA Goal status: INITIAL  4.  Pt will perform bilateral reach outside seated BOS with feet supported without LOB in order to improve safety with ADL performance. Baseline: lateral LOB bilaterally demonstrated in  w/c Goal status: INITIAL  LONG TERM GOALS: Target date: 06/03/2023  Pt will perform squat pivot transfer w/ no more than modA in order to improve access to home environment for toilet transfers. Baseline: pt reports recent practice with depression  transfers Goal status: INITIAL  2.  Pt will perform sit<>supine requiring no more than supervision in order to improve transfers in and out of bed. Baseline:  modA Goal status: INITIAL  3.  Aquatic HEP to be established with caregiver assistance as appropriate. Baseline:  To be established. Goal status: INITIAL  4.  Pt will be able to perform rolling L and R w/ no more than supervision in order to improve functional mobility. Baseline: modA Goal status: INITIAL  5.  Pt will perform stand pivot transfer w/ no more than modA in order to improve toilet transfer for ongoing bladder training. Baseline:  To be assessed. Goal status: INITIAL  6.  Pt will stand from no more than 20 inch seat height with no more than modA to initiate transfer with maintained stand for >/=5 minutes in order to improve trunk and LE strength and stability for functional mobility and transfers. Baseline: To be assessed. Goal status: INITIAL  ASSESSMENT:  CLINICAL IMPRESSION: Emphasis of skilled PT session on working on scapular strengthening in prone position, functional strengthening in tall-kneeling position, and bed mobility supine to/from sit and supine to/from prone. Pt exhibits good use of body mechanics to perform bed mobility and transfers. Pt can continue to benefit from skilled therapy services to work towards increased safety and independence with functional mobility. Continue POC.   OBJECTIVE IMPAIRMENTS: decreased balance, decreased coordination, decreased mobility, difficulty walking, decreased ROM, decreased strength, hypomobility, increased edema, impaired flexibility, impaired sensation, impaired tone, impaired UE functional use, improper body mechanics, postural dysfunction, and pain.   ACTIVITY LIMITATIONS: carrying, lifting, bending, sitting, standing, squatting, stairs, transfers, bed mobility, continence, bathing, toileting, dressing, reach over head, hygiene/grooming, locomotion level, and  caring for others  PARTICIPATION LIMITATIONS: meal prep, cleaning, laundry, driving, and community activity  PERSONAL FACTORS: Age, Fitness, Past/current experiences, Time since onset of injury/illness/exacerbation, and 1-2 comorbidities: intermittent AD, neurogenic bowel/bladder on active bladder training, osteoporosis  are also affecting patient's functional outcome.   REHAB POTENTIAL: Good  CLINICAL DECISION MAKING: Evolving/moderate complexity  EVALUATION COMPLEXITY: Moderate  PLAN:  PT FREQUENCY: 2x/week (1x/wk land and 1x/wk in pool)  PT DURATION: 8 weeks  PLANNED INTERVENTIONS: 16109- PT Re-evaluation, 97110-Therapeutic exercises, 97530- Therapeutic activity, 97112- Neuromuscular re-education, 97535- Self Care, 60454- Manual therapy, [phone]- Gait training, [phone]- Orthotic Fit/training, [phone]- Aquatic Therapy, 97014- Electrical stimulation (unattended), [phone]- Electrical stimulation (manual), Patient/Family education, Balance training, Dry Needling, DME instructions, Wheelchair mobility training, Moist heat, Therapeutic exercises, Therapeutic activity, Neuromuscular re-education, Gait training, and Self Care  PLAN FOR NEXT SESSION:  Multilevel slide board transfers vs stand pivots.  Core strength, seated reaching/trunk righting.  Bed mobility.  Use stander to practice weight-bearing and core as tolerated.  Practice stands from progressively lowered surface. Standing with LRAD.  Left shoulder and periscapular TPDN?  Pt was discussing this at [facility]. Pt will switch to leg bag for aquatics as she used at [place]. Pt may be going to [place] for prolonged period in November (probably not until Thanksgiving)  Peter Congo, PT Peter Congo, PT, DPT, CSRS  04/15/2023, 12:34 PM

## 2023-04-16 ENCOUNTER — Other Ambulatory Visit: Payer: Self-pay

## 2023-04-16 ENCOUNTER — Emergency Department (HOSPITAL_COMMUNITY): Payer: Medicare PPO

## 2023-04-16 ENCOUNTER — Encounter (HOSPITAL_COMMUNITY): Payer: Self-pay

## 2023-04-16 ENCOUNTER — Emergency Department (HOSPITAL_COMMUNITY)
Admission: EM | Admit: 2023-04-16 | Discharge: 2023-04-16 | Disposition: A | Discharge status: Home / Self Care | Payer: Medicare PPO | Attending: Emergency Medicine | Admitting: Emergency Medicine

## 2023-04-16 DIAGNOSIS — D71 Functional disorders of polymorphonuclear neutrophils: Secondary | ICD-10-CM | POA: Diagnosis not present

## 2023-04-16 DIAGNOSIS — D649 Anemia, unspecified: Secondary | ICD-10-CM | POA: Diagnosis not present

## 2023-04-16 DIAGNOSIS — R600 Localized edema: Secondary | ICD-10-CM | POA: Diagnosis not present

## 2023-04-16 DIAGNOSIS — R635 Abnormal weight gain: Secondary | ICD-10-CM | POA: Diagnosis not present

## 2023-04-16 DIAGNOSIS — D72819 Decreased white blood cell count, unspecified: Secondary | ICD-10-CM | POA: Diagnosis not present

## 2023-04-16 DIAGNOSIS — R140 Abdominal distension (gaseous): Secondary | ICD-10-CM | POA: Insufficient documentation

## 2023-04-16 LAB — CBC WITH DIFFERENTIAL/PLATELET
Abs Immature Granulocytes: 0.01 10*3/uL (ref 0.00–0.07)
Basophils Absolute: 0.1 10*3/uL (ref 0.0–0.1)
Basophils Relative: 1 %
Eosinophils Absolute: 0.1 10*3/uL (ref 0.0–0.5)
Eosinophils Relative: 3 %
HCT: 35.5 % — ABNORMAL LOW (ref 36.0–46.0)
Hemoglobin: 11.4 g/dL — ABNORMAL LOW (ref 12.0–15.0)
Immature Granulocytes: 0 %
Lymphocytes Relative: 30 %
Lymphs Abs: 1.2 10*3/uL (ref 0.7–4.0)
MCH: 30.2 pg (ref 26.0–34.0)
MCHC: 32.1 g/dL (ref 30.0–36.0)
MCV: 94.2 fL (ref 80.0–100.0)
Monocytes Absolute: 0.3 10*3/uL (ref 0.1–1.0)
Monocytes Relative: 6 %
Neutro Abs: 2.3 10*3/uL (ref 1.7–7.7)
Neutrophils Relative %: 60 %
Platelets: 200 10*3/uL (ref 150–400)
RBC: 3.77 MIL/uL — ABNORMAL LOW (ref 3.87–5.11)
RDW: 15.7 % — ABNORMAL HIGH (ref 11.5–15.5)
WBC: 3.9 10*3/uL — ABNORMAL LOW (ref 4.0–10.5)
nRBC: 0 % (ref 0.0–0.2)

## 2023-04-16 LAB — COMPREHENSIVE METABOLIC PANEL
ALT: 33 U/L (ref 0–44)
AST: 32 U/L (ref 15–41)
Albumin: 3.1 g/dL — ABNORMAL LOW (ref 3.5–5.0)
Alkaline Phosphatase: 76 U/L (ref 38–126)
Anion gap: 7 (ref 5–15)
BUN: 9 mg/dL (ref 8–23)
CO2: 30 mmol/L (ref 22–32)
Calcium: 9 mg/dL (ref 8.9–10.3)
Chloride: 101 mmol/L (ref 98–111)
Creatinine, Ser: 0.35 mg/dL — ABNORMAL LOW (ref 0.44–1.00)
GFR, Estimated: >60 mL/min (ref >60)
Glucose, Bld: 87 mg/dL (ref 70–99)
Potassium: 3.8 mmol/L (ref 3.5–5.1)
Sodium: 138 mmol/L (ref 135–145)
Total Bilirubin: 0.6 mg/dL (ref 0.3–1.2)
Total Protein: 6.5 g/dL (ref 6.5–8.1)

## 2023-04-16 LAB — BRAIN NATRIURETIC PEPTIDE: B Natriuretic Peptide: 45.7 pg/mL (ref 0.0–100.0)

## 2023-04-16 NOTE — Discharge Instructions (Signed)
You are seen the emergency department today with concerns of weight gain.  Your labs appear to show no signs of any kidney, liver, or other organ impairment.  There is no obvious signs of fluid overload as your BNP level is normal.  Your urinary catheter was evaluated and appears to be flowing properly with no obvious sediment or other abnormalities.  I would advise following up with your primary care provider to ensure that your symptoms are improving.  If you continue to have worsening in symptoms, return the emergency department.

## 2023-04-16 NOTE — ED Notes (Signed)
Flushed catheter with 50ml of normal saline.

## 2023-04-16 NOTE — ED Provider Notes (Signed)
[name] EMERGENCY DEPARTMENT AT [facility] Provider Note   CSN: [id number] Arrival date & time: 04/16/23  0941     History Chief Complaint  Patient presents with   Weight Gain    [name] is a 72 y.o. female.  Patient past history significant for quadriplegia, C5-C7 incomplete, suprapubic catheter, recurrent UTIs, recent pneumonia presents emergency department with weight gain.  Reports that she was recently admitted in the hospital in [facility] for community-acquired pneumonia and was on IV vancomycin.  States that since she was discharged about 2 weeks ago, she has noted a 7 pound weight gain with decreased urine output.  Also worsening some mild leg swelling however she is currently wearing compression stockings.  Patient typically moves with use of a motorized wheelchair with husband and a nurse as caregivers for her at home.  She denies any other acute concerns at this time such as chest pain, shortness of breath, increased phlegm or sputum production, fevers, weakness, nausea, vomiting, diarrhea.  Primarily concerned about notable weight gain over the last several weeks however per patient's nurse, she reports that patient had some weight loss prior to being admitted to the hospital so unsure patient is back to baseline weight or not.  HPI     Home Medications Prior to Admission medications   Medication Sig Start Date End Date Taking? Authorizing Provider  amoxicillin-clavulanate (AUGMENTIN) 875-125 MG tablet Take 1 tablet by mouth 2 (two) times daily. Patient not taking: Reported on 03/16/2023 11/02/22   Odette Fraction, MD  ascorbic acid (VITAMIN C) 1000 MG tablet Take 1 tablet (1,000 mg total) by mouth in the morning, at noon, in the evening, and at bedtime. 11/02/22   Odette Fraction, MD  atorvastatin (LIPITOR) 20 MG tablet Take 1 tablet (20 mg total) by mouth daily. 01/31/23   Panosh, [name], MD  baclofen (LIORESAL) 20 MG tablet Take 2 tablets (40 mg total)  by mouth 3 (three) times daily. For spasticity 01/14/23   Lovorn, Aundra Millet, MD  bisacodyl (DULCOLAX) 10 MG suppository Place 10 mg rectally as needed for moderate constipation. Insert one suppository per rectum with each bowel program procedure.    [provider]  cholecalciferol (VITAMIN D3) 25 MCG (1000 UNIT) tablet Take 1,000 Units by mouth daily. 2000u    [provider]  [employer] COENZYME Q-10 100 MG capsule Take 100 mg by mouth daily.  10/08/19   [provider]  Docusate Sodium (DSS) 100 MG CAPS Take by mouth. Patient not taking: Reported on 03/16/2023 10/14/20   [provider]  famotidine (PEPCID) 20 MG tablet Take 20 mg by mouth 2 (two) times daily.    [provider]  fesoterodine (TOVIAZ) 8 MG TB24 tablet Take 1 tablet (8 mg total) by mouth daily. 01/31/23   Panosh, [name], MD  gabapentin (NEURONTIN) 600 MG tablet TAKE 2 TABLETS (1,200 MG TOTAL) BY MOUTH 3 (THREE) TIMES DAILY. 03/12/23   Worthy Rancher B, FNP  methenamine (HIPREX) 1 g tablet TAKE 1 TABLET BY MOUTH TWICE A DAY WITH MEALS 03/14/23   Odette Fraction, MD  metroNIDAZOLE (METROGEL) 1 % gel APPLY TOPICALLY EVERY DAY 12/13/19   Panosh, [name], MD  mirabegron ER (MYRBETRIQ) 25 MG TB24 tablet Take 50 mg by mouth. 11/24/20   [provider]  Multiple Vitamins-Minerals (CENTRUM SILVER ULTRA WOMENS PO) Take by mouth.    [provider]  mupirocin ointment (BACTROBAN) 2 % Apply 1 Application topically 2 (two) times daily. Patient  not taking: Reported on 03/16/2023 12/03/21   Candelaria Stagers, DPM  MYRBETRIQ 50 MG TB24 tablet TAKE 1 TABLET BY MOUTH EVERY DAY 11/09/22   Lovorn, Aundra Millet, MD  naproxen sodium (ALEVE) 220 MG tablet Take 220 mg by mouth. Tablet p.o per package directions as needed for pain    [provider]  nortriptyline (PAMELOR) 50 MG capsule Take 1 capsule (50 mg total) by mouth at bedtime. 11/26/22   Eulis Foster, FNP  SANTYL 250 UNIT/GM ointment  08/08/22   [provider]  SENNA CO by Combination route. Sennosides 8.6mg  tab p.o per package directions daily as needed to promote bowel movement. Patient not taking: Reported on 03/16/2023    [provider]  Teriparatide (FORTEO) 600 MCG/2.4ML SOPN Inject 20 mcg into the skin daily in the afternoon. FORTEO 04/11/23 05/11/23  Altamese Finger, MD  UNABLE TO FIND Med Name: Saline Enema Per rectum per package directions as needed for relief of constipation.    [provider]  zolpidem (AMBIEN) 5 MG tablet Take 0.5 tablets (2.5 mg total) by mouth at bedtime as needed for sleep. 03/26/23   Eulis Foster, FNP      Allergies    Patient has no known allergies.    Review of Systems   Review of Systems  Constitutional:  Positive for unexpected weight change.  All other systems reviewed and are negative.   Physical Exam Updated Vital Signs BP 120/70 (BP Location: Left Arm)   Pulse 72   Temp 98.4 F (36.9 C) (Oral)   Resp 19   Ht 5\' 4"  (1.626 m)   Wt 52.2 kg   SpO2 100%   BMI 19.74 kg/m  Physical Exam Vitals and nursing note reviewed.  Constitutional:      General: She is not in acute distress.    Appearance: She is well-developed.  HENT:     Head: Normocephalic and atraumatic.  Eyes:     Conjunctiva/sclera: Conjunctivae normal.  Cardiovascular:     Rate and Rhythm: Normal rate and regular rhythm.     Heart sounds: No murmur heard. Pulmonary:     Effort: Pulmonary effort is normal. No respiratory distress.     Breath sounds: Normal breath sounds. No wheezing.  Abdominal:     General: There is distension.     Palpations: Abdomen is soft.     Tenderness: There is no abdominal tenderness.     Comments: Some distension in the suprapubic space where catheter appears to be well positioned and draining.  Musculoskeletal:        General: No swelling.     Cervical back: Neck supple.     Comments: Trace edema in bilateral lower legs, however, patient wearing compressions  stockings prior to legs being assessed  Skin:    General: Skin is warm and dry.     Capillary Refill: Capillary refill takes less than 2 seconds.  Neurological:     Mental Status: She is alert.  Psychiatric:        Mood and Affect: Mood normal.     ED Results / Procedures / Treatments   Labs (all labs ordered are listed, but only abnormal results are displayed) Labs Reviewed  COMPREHENSIVE METABOLIC PANEL - Abnormal; Notable for the following components:      Result Value   Creatinine, Ser 0.35 (*)    Albumin 3.1 (*)    All other components within normal limits  CBC WITH DIFFERENTIAL/PLATELET - Abnormal; Notable for the  following components:   WBC 3.9 (*)    RBC 3.77 (*)    Hemoglobin 11.4 (*)    HCT 35.5 (*)    RDW 15.7 (*)    All other components within normal limits  BRAIN NATRIURETIC PEPTIDE    EKG None  Radiology DG Chest Portable 1 View  Result Date: 04/16/2023 CLINICAL DATA:  Recent pneumonia EXAM: PORTABLE CHEST 1 VIEW COMPARISON:  None Available. FINDINGS: Calcified right hilar lymph nodes as well as likely calcified right lung base nodules consistent with old granulomatous disease. Hyperinflation. No consolidation, pneumothorax or effusion. No edema. Normal cardiopericardial silhouette. Curvature and degenerative changes of the spine. Fixation hardware along the lower cervical spine at the edge of the imaging field. IMPRESSION: Hyperinflation.  Evidence of old granulomatous disease. Electronically Signed   By: Karen Kays M.D.   On: 04/16/2023 11:54    Procedures Procedures   Medications Ordered in ED Medications - No data to display  ED Course/ Medical Decision Making/ A&P                               Medical Decision Making Amount and/or Complexity of Data Reviewed Labs: ordered. Radiology: ordered.   This patient presents to the ED for concern of weight gain.  Differential diagnosis includes urinary retention, fluid overload, mass, normal weight  gain   Lab Tests:  I Ordered, and personally interpreted labs.  The pertinent results include: CBC with baseline anemia noted, slightly decreased white blood count at 3.9, CMP with no evidence of renal impairment or liver abnormality, no obvious electrolyte disturbance, BMP is unremarkable   Imaging Studies ordered:  I ordered imaging studies including chest x-ray I independently visualized and interpreted imaging which showed no acute cardiopulmonary process I agree with the radiologist interpretation   Problem List / ED Course:  Patient presents to the emergency department with concerns of weight gain.  Was recently admitted in [name] for when he acquired pneumonia and was on IV antibiotics.  She is concerned if the vancomycin that she received potentially cause some sort of renal impairment that is now causing her to hold onto fluid.  Patient at baseline has suprapubic catheter in place that she is a quadriplegic.  She also has a home health nurse which is present at all times and has not noted any obvious changes in urine output although there has been some decreased urine output.  Patient states that she is eating and drinking close to her baseline although she does not endorse that she was dehydrated around the time that she is admitted to the hospital in [name].  Nurse suspects that she was dehydrated at the time of discharge from hospital.  Given history, workup with basic labs, CBC, CMP BNP as well as chest x-ray and EKG.  Doubt fluid overload as there is no acute signs of significant edema in the lower extremities or in the abdomen.  There are some suprapubic distention but mild in nature.  Workup is reassuring with no acute findings suggesting renal impairment or other signs of fluid overload with a BNP that is only 45.7.  Bladder scan shows patient had approximately 50 to 75 mL of urine in bladder.  Suprapubic catheter was flushed with 50 cc of saline with good return.  Appears to be  that there is no obstruction of the catheter either.  Unsure at this time what exactly is causing patient's weight gain but suspect  may be due to recent hospitalization that patient may be normalizing returning back to baseline weight.  Patient was previously approximate 160 pounds and had notable weight loss intentionally but has now had some slow return in weight over the last 2 weeks.  Advised patient to follow-up with a primary care provider for further evaluation as well as urologist if symptoms persist.  Given no other acute findings on workup, do not believe the patient requires further evaluation or admission to the hospital at this time.  Patient was in agreement with current plan verbalized understanding return precautions.  Patient discharged home in stable condition.  Final Clinical Impression(s) / ED Diagnoses Final diagnoses:  Weight gain    Rx / DC Orders ED Discharge Orders     None         Smitty Knudsen, PA-C 04/16/23 2024    Anders Simmonds T, DO 04/16/23 2042

## 2023-04-16 NOTE — ED Triage Notes (Signed)
Patient reports she was in the hospital in [place] and recently discharged and had IV vancomycin.  Reports now has had 7lb weight gain since and decreased urine output with mild leg swelling.  Patient comes in motorized wheelchair, husband and caregiver.  Patient denies any other complaints other than the decreased output, weight gain and leg swelling. Patient has suprapubic cathter in place

## 2023-04-18 ENCOUNTER — Ambulatory Visit: Payer: Medicare PPO | Admitting: Occupational Therapy

## 2023-04-18 ENCOUNTER — Ambulatory Visit: Payer: Medicare PPO | Admitting: Physical Therapy

## 2023-04-18 DIAGNOSIS — R2689 Other abnormalities of gait and mobility: Secondary | ICD-10-CM | POA: Diagnosis not present

## 2023-04-18 DIAGNOSIS — R208 Other disturbances of skin sensation: Secondary | ICD-10-CM | POA: Diagnosis not present

## 2023-04-18 DIAGNOSIS — G8254 Quadriplegia, C5-C7 incomplete: Secondary | ICD-10-CM | POA: Diagnosis not present

## 2023-04-18 DIAGNOSIS — R293 Abnormal posture: Secondary | ICD-10-CM | POA: Diagnosis not present

## 2023-04-18 DIAGNOSIS — M24541 Contracture, right hand: Secondary | ICD-10-CM

## 2023-04-18 DIAGNOSIS — G8253 Quadriplegia, C5-C7 complete: Secondary | ICD-10-CM

## 2023-04-18 DIAGNOSIS — M6281 Muscle weakness (generalized): Secondary | ICD-10-CM | POA: Diagnosis not present

## 2023-04-18 DIAGNOSIS — R29818 Other symptoms and signs involving the nervous system: Secondary | ICD-10-CM | POA: Diagnosis not present

## 2023-04-18 DIAGNOSIS — R278 Other lack of coordination: Secondary | ICD-10-CM

## 2023-04-18 DIAGNOSIS — R29898 Other symptoms and signs involving the musculoskeletal system: Secondary | ICD-10-CM | POA: Diagnosis not present

## 2023-04-18 DIAGNOSIS — M24542 Contracture, left hand: Secondary | ICD-10-CM

## 2023-04-18 NOTE — Therapy (Signed)
OUTPATIENT OCCUPATIONAL THERAPY NEURO TREATMENT  Patient Name: [name] MRN: [id number] DOB:1950-07-06, 73 y.o., female Today's Date: 04/18/2023  PCP: [name], MD  REFERRING PROVIDER: Maryjane Hurter, MD   END OF SESSION:  OT End of Session - 04/18/23 0935     Visit Number 4    Number of Visits 17    Date for OT Re-Evaluation 06/10/23    Authorization Type [employer] - requires auth, [place]    Progress Note Due on Visit 10    OT Start Time 0934    Equipment Utilized During Treatment Personal Estim unit    Activity Tolerance Patient tolerated treatment well    Behavior During Therapy [facility] for tasks assessed/performed              Past Medical History:  Diagnosis Date   CERVICAL POLYP 03/11/2008   Qualifier: Diagnosis of  By: [name] MD, [name]    Colon polyps 2005   on colonscopy [name]   Fibroid 2004   Per Dr. [name]   History of shingles    face and mouth   Hx of skin cancer, basal cell    Rosacea    Sciatica of left side 09/28/2013   Scoliosis    noted on mri done for back pain   Past Surgical History:  Procedure Laterality Date   BUNIONECTOMY     Patient Active Problem List   Diagnosis Date Noted   Buttock wound, left, subsequent encounter 03/16/2023   Bronchiectasis with acute exacerbation (HCC) 03/15/2023   Buttock wound, left, initial encounter 11/02/2022   Orthostatic hypotension 08/13/2022   Neurogenic bowel 05/03/2022   Spasticity 05/03/2022   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/03/2022   Nerve pain 05/03/2022   Medication monitoring encounter 01/08/2022   Neurogenic bladder 10/11/2021   Urinary incontinence 10/11/2021   ESBL (extended spectrum beta-lactamase) producing bacteria infection 10/09/2021   Recurrent UTI 10/09/2021   Quadriplegia, C5-C7 incomplete (HCC) 01/16/2021   History of spinal fracture 01/16/2021   Suprapubic catheter (HCC) 01/16/2021   Encounter for routine gynecological examination 09/28/2013   Onychomycosis  09/28/2013   Foot deformity, acquired 03/26/2012   Encounter for preventive health examination 12/25/2010   ROSACEA 08/25/2009   Disturbance in sleep behavior 03/11/2008   SKIN CANCER, HX OF 03/11/2008   DYSURIA, HX OF 03/11/2008   Hyperlipidemia 02/10/2007   CERVICALGIA 02/10/2007    ONSET DATE: 07/28/2020  Date of Referral 03/23/2023  REFERRING DIAG: G82.54 (ICD-10-CM) - Quadriplegia, C5-C7, incomplete (HCC)  THERAPY DIAG:  Other lack of coordination  Muscle weakness (generalized)  Other symptoms and signs involving the nervous system  Contracture of hand joint, right  Contracture of hand joint, left  Other symptoms and signs involving the musculoskeletal system  Other disturbances of skin sensation  Quadriplegia, C5-C7 complete (HCC)  Rationale for Evaluation and Treatment: Rehabilitation  SUBJECTIVE:   SUBJECTIVE STATEMENT: Pt reports she has been using variation of ArcEx more frequently. Over the weekend, she went to ED as she had gained weight quickly and was concerned she was going into renal failure especially as her volume output went down.   Pt accompanied by:  Live in Caregiver - [name]   PERTINENT HISTORY: "Pt is a 72 yr old L handed female with hx of incomplete quadriplegia- 2/14 2022- [place] the police in [place] on passenger 100 (high speed) miles/hour,  [name]; neurogenic bowel and bladder and spasticity; [name], [name] Here for f/u on Incomplete quadriplegia"  PRECAUTIONS:  Fall; suprapubic catheter (she wants to get this removed meaning she needs to get to and from the toilet); she has had minor heat sensation when needing to complete her bowel program-possible AD?   WEIGHT BEARING RESTRICTIONS: No  PAIN: - reports average pain as noted below. Will notify therapist if there are changes in her pain.  Are you having pain? Yes: NPRS scale: 3/10 Pain location: fingers to elbow bilaterally Pain description: constant Aggravating  factors: it can increased over time ie) is worse at the end of the day.  Also cold affects cramps and function. Relieving factors: gabapentin and baclofen for spasms, nightly stretching  FALLS: Has patient fallen in last 6 months? No  LIVING ENVIRONMENT: Lives with: lives with their family - husband [name] and with an adult companion s/p moving back up from [place] x10 months Lives in: House/apartment Stairs:  4 story town house with an Engineer, structural with threshold adjustments, roll in shower with transport chair Has following equipment at home: Wheelchair (power) - with seat height adjustments to access counters and reclining option, Wheelchair (manual), transport WC, shower chair, and Ramped entry, handheld showerhead with rails around toilet, had Michiel Sites but is no longer in need of it, has slide boards x3  PLOF: Requires assistive device for independence, Needs assistance with ADLs, Needs assistance with homemaking, Needs assistance with gait, and Needs assistance with transfers; 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  Used to like to knit, sew and bake.  PATIENT GOALS: improve L shoulder pain, ROM, and strength; core strength  OBJECTIVE:   HAND DOMINANCE: Left  ADLs: Overall ADLs: Patient has a live in caregiver  Transfers/ambulation related to ADLs: min assist with sliding board transfers.  Eating: Has a rocker knife that she can use. Used to use adapted utensils but now uses regular utensils but still will get assistance to cut food ie) when eating out.  Grooming: can brush her own hair with LUE only; unable to manage jewelry ie) earrings  UB Dressing: can zip/unzip after it has been started, unable to manage buttons herself, Caregiver assists but if she has extra time, she can put on her bra, and a loose fitting pullover shirt/t-shirt  LB Dressing: dependent for LB dressing in bed and with special sock donner for LE compression garments   Toileting: bladder trained with  suprapubic catheter which she clamps off.  Dependent for bowel incontinence care.  Bathing: Sponge bath with adult washclothes.  Can bathe UB with back scrubber for most of her back.  Needs help with feet (mentioned she might need a separate brush for feet)   Tub Shower transfers: Min assist with slide board  Equipment: Shower seat with back, Walk in shower, bed side commode, Reacher, Sock aid, Long handled sponge, and Feeding equipment  IADLs: --  Shopping: Assisted by caregiver  Light housekeeping: Has housekeeper that comes monthly  Meal Prep: previously enjoyed baking. Assisted by caregiver but has reheated a meal for herself after getting food out of the fridge/freezer from her WC.  Community mobility: Dependent  Medication management: Caregiver sorts them into pillbox but she is very aware of her medications   Financial management: Patient manages her own finances  Handwriting: Increased time and has a pen with a little grip  MOBILITY STATUS: Independent with power mobility  ACTIVITY TOLERANCE: Activity tolerance: good to Fair - MMT WFL but has limited sustained tolerance for ongoing use of Ues with poor trunk control  FUNCTIONAL OUTCOME MEASURES:  Total score = sum of the activity scores/number of activities Minimum detectable change (90%CI) for average score = 2 points Minimum detectable change (90%CI) for single activity score = 3 points   UPPER EXTREMITY ROM:   AROM - WFL without obvious contractures, some digital flexion noted but PROM WNL   UPPER EXTREMITY MMT:   Grossly WFL - Endurance limited R tricep strength > than L but L UE generally stronger than R UE  MMT Right (eval) Left (eval)  Shoulder flexion 4/5 4/5  Shoulder abduction 4/5 4/5  Elbow flexion 4/5 4/5  Elbow extension 4/5 4-/5  (Blank rows = not tested)  HAND FUNCTION: Grip strength: Right: 5.2 lbs; Left: 15.6 lbs  COORDINATION: 9 Hole Peg test: Right: able to place all 9 and remove 4 pegs  in 3 minutes sec; Left: 84 sec R hand finger eventually cramps and dexterity worsens in the cold  SENSATION: Light touch: Impaired  - patient   EDEMA: NA for UEs but LE has poor lymph drainage with custom compression garments   MUSCLE TONE: Generally WFL   COGNITION: Overall cognitive status: Within functional limits for tasks assessed  VISION: Subjective report: Patent wears progressive lens/glasses.  Denies diplopia or vision changes. Baseline vision: Wears glasses all the time  VISION ASSESSMENT: WFL  OBSERVATIONS: Patient independent with power WC navigation within clinic.  Patient is well-kept with foley catheter in place.  She has slight limitations in full extension of digits but PROM is WNL.    TODAY'S TREATMENT:                                                                                        With use of R and L, pt placed and then removed colored, large pegs into corresponding hole with use of pattern sheets for ROM, coordination, and strength of affected extremity. Removal 2 at a time for challenge to in hand manipulation. Bimanual placement of pegs for challenge to core.   Placement and removal of yellow and red resistive clips with use of Each hand 3 point pinch for strengthening of affected extremity.  Pt completed bar slides at table bimanually for core strength as needed for balance with functional mobility.   PATIENT EDUCATION: Education details: see today's treatment above Person educated: Patient and Caregiver Live in caregiver - [name] Education method: Explanation, Demonstration, and Verbal cues Education comprehension: verbalized understanding, returned demonstration, and needs further education  HOME EXERCISE PROGRAM: TBD  GOALS:   SHORT TERM GOALS: Target date: 05/06/2023    Patient will be able to use AE/modified techniques to cut soft foods small enough for oral intake. Baseline: Caregiver/spouse assist Goal status: INITIAL  2.  Pt will be  independent with BUE braces/splints as needed to prevent contracture and improve functional use of hands.  Baseline: Caregiver/spouse assist; pt lacks carryover from [facility] Goal status: IN progress  3.  Pt will be independent with use of ARCEX device as needed for carryover and improvement of BUE function.  Baseline: Caregiver/spouse assist; unable to independently recall protocol for use Goal status: IN progress   LONG TERM GOALS: Target date: 06/10/23   Patient will  demonstrate independence with updated HEP for UE strengthening, coordination and ROM to prevent contractures and maintain strength for transfers and ADLs. Baseline: Previous HEPs have been established but need to be reviewed and updated.  Goal status: INITIAL  2.  Patient will report at least two-point increase in average PSFS score or at least three-point increase in a single activity score indicating functionally significant improvement given minimum detectable change.  Baseline: 0.5 total score (See above for individual activity scores)   3.  Pt will demonstrate ability to place 5lb item bimanually on table at least 1' in front of her without using support of elbows, without leaning against back of chair, and with good control.  Baseline: unable Goal status: INITIAL  4.  Pt will report increased ability to push up through LUE with completion of transfers. Baseline: unable Goal status: INITIAL   ASSESSMENT:  CLINICAL IMPRESSION: Pt tolerated exercises well as needed to improve strength and fine motor coordination as needed to progress towards goals. Recommend continued core and fine motor coordination work with modified ArcEx.   PERFORMANCE DEFICITS: in functional skills including ADLs, IADLs, coordination, dexterity, strength, muscle spasms, Fine motor control, Gross motor control, continence, skin integrity, and UE functional use,   IMPAIRMENTS: are limiting patient from ADLs, IADLs, work, and leisure.    CO-MORBIDITIES: has co-morbidities such as incontinence and wound  that affects occupational performance. Patient will benefit from skilled OT to address above impairments and improve overall function.  REHAB POTENTIAL: Fair due to chronicity of injury  PLAN:  OT FREQUENCY: 2x/week  OT DURATION: 8 weeks  PLANNED INTERVENTIONS: self care/ADL training, therapeutic exercise, therapeutic activity, neuromuscular re-education, manual therapy, passive range of motion, balance training, functional mobility training, splinting, patient/family education, energy conservation, coping strategies training, and DME and/or AE instructions  RECOMMENDED OTHER SERVICES: Patient was seen for PT evaluation today with treatment plans coordinated for 2x/week.  CONSULTED AND AGREED WITH PLAN OF CARE: Patient and family member/caregiver  PLAN FOR NEXT SESSION: Bimanual blaze pods  Establish weight shifting instruction for pressure relief - provide handout  Practice BUE positioning when using pt's personal rocker knife and adaptive knife for feeding tasks - pt to bring equipment  Will review splints/splint schedule with patient.   Initiate ROM HEP.   Explore FM tasks  Review EazyHold (silicone strap) for hair brush PRN  Delana Meyer, OT 04/18/2023, 9:36 AM

## 2023-04-18 NOTE — Therapy (Signed)
OUTPATIENT PHYSICAL THERAPY NEURO TREATMENT   Patient Name: [name] MRN: [id number] DOB:May 21, 1951, 72 y.o., female Today's Date: 04/18/2023   PCP: [name], MD REFERRING PROVIDER: Maryjane Hurter, MD  END OF SESSION:   PT End of Session - 04/18/23 1018     Visit Number 4    Number of Visits 17   16 + eval   Date for PT Re-Evaluation 06/10/23   to allow for scheduling delays   Authorization Type [employer] MEDICARE    Progress Note Due on Visit 10    PT Start Time 1018   from OT session   PT Stop Time 1102    PT Time Calculation (min) 44 min    Equipment Utilized During Treatment Gait belt    Activity Tolerance Patient tolerated treatment well    Behavior During Therapy [facility] for tasks assessed/performed                Past Medical History:  Diagnosis Date   CERVICAL POLYP 03/11/2008   Qualifier: Diagnosis of  By: [name] MD, [name]    Colon polyps 2005   on colonscopy [name]   Fibroid 2004   Per Dr. [name]   History of shingles    face and mouth   Hx of skin cancer, basal cell    Rosacea    Sciatica of left side 09/28/2013   Scoliosis    noted on mri done for back pain   Past Surgical History:  Procedure Laterality Date   BUNIONECTOMY     Patient Active Problem List   Diagnosis Date Noted   Buttock wound, left, subsequent encounter 03/16/2023   Bronchiectasis with acute exacerbation (HCC) 03/15/2023   Buttock wound, left, initial encounter 11/02/2022   Orthostatic hypotension 08/13/2022   Neurogenic bowel 05/03/2022   Spasticity 05/03/2022   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/03/2022   Nerve pain 05/03/2022   Medication monitoring encounter 01/08/2022   Neurogenic bladder 10/11/2021   Urinary incontinence 10/11/2021   ESBL (extended spectrum beta-lactamase) producing bacteria infection 10/09/2021   Recurrent UTI 10/09/2021   Quadriplegia, C5-C7 incomplete (HCC) 01/16/2021   History of spinal fracture 01/16/2021   Suprapubic catheter (HCC)  01/16/2021   Encounter for routine gynecological examination 09/28/2013   Onychomycosis 09/28/2013   Foot deformity, acquired 03/26/2012   Encounter for preventive health examination 12/25/2010   ROSACEA 08/25/2009   Disturbance in sleep behavior 03/11/2008   SKIN CANCER, HX OF 03/11/2008   DYSURIA, HX OF 03/11/2008   Hyperlipidemia 02/10/2007   CERVICALGIA 02/10/2007    ONSET DATE: 03/11/2023 (most recent referral)  REFERRING DIAG: G82.54 (ICD-10-CM) - Quadriplegia, C5-C7 incomplete  THERAPY DIAG:  Muscle weakness (generalized)  Quadriplegia, C5-C7 complete (HCC)  Abnormal posture  Rationale for Evaluation and Treatment: Rehabilitation  SUBJECTIVE:  SUBJECTIVE STATEMENT: Pt received seated in PWC from OT session.  Pt went to ED over the weekend due to 7.5 lb increase over the past 2 weeks with decreased urine output. Following testing it was determined that pt does not have CHF and no renal issues, was dehydrated so body was trying to put weight back on. Pt reports having "no pain" today and following dry needling last session.  Pt reports she has been using her standing frame at home x 1 hour, works on fine motor tasks with UE while standing.  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 No  PRECAUTIONS: Fall; suprapubic catheter (she wants to get this removed meaning she needs to get to and from the toilet); she has had minor heat sensation when needing to complete her bowel program-possible AD?; pt has recently returned to wearing binder since pneumonia due to breathing and core stability.  Pt/caregiver performs bladder clamping prior to transfers as pt is still  doing bladder training.  WEIGHT BEARING RESTRICTIONS: No-Follow fall precautions, adequate dairy in diet and exercises (aerobic, balancing and weight bearing) as tolerated. - most recent endocrinologist note.  FALLS: Has patient fallen in last 6 months? No  LIVING ENVIRONMENT: Lives with: lives with their spouse and live-in nurse [name] Lives in: House/apartment-townhouse Stairs: No-level entry, but multi-level home 4 stories w/ elevator Has following equipment at home: Wheelchair (power), Wheelchair (manual), Grab bars, and standing frame, sliding board, transport shower chair, handheld shower head, leg strap-pt reports she no longer finds this helpful  PLOF: Requires assistive device for independence, Needs assistance with ADLs, Needs assistance with homemaking, and Needs assistance with transfers  OCCUPATION:  Writer-uses Dragon to dictate  PATIENT GOALS: "Make my right leg work."  Stand and pivot so she can more easily access a toilet.  OBJECTIVE:   DIAGNOSTIC FINDINGS:  ASSESSMENT: The BMD measured at Forearm Radius 33% is 0.506 g/cm2 with a T-score of -4.2. This patient is considered osteoporotic according to World Health Organization [address] [employer]) criteria.  COGNITION: Overall cognitive status: Within functional limits for tasks assessed   SENSATION: Light touch: Diminished ability to distinguish sharp and dull, but able to distinguish light touch from injury level down accurately  COORDINATION: Not formally assessed.  EDEMA:  Well managed w/ lymphatic massage and compression stockings.  MUSCLE TONE: Pt has intermittent clonus during transfers.  POSTURE: rounded shoulders, posterior pelvic tilt, and right thoracic scoliosis  LOWER EXTREMITY ROM:     Passive  Right Eval Left Eval  Hip flexion See below.  Hip extension   Hip abduction   Hip adduction   Hip internal rotation   Hip external rotation   Knee flexion   Knee extension   Ankle dorsiflexion   Ankle  plantarflexion   Ankle inversion   Ankle eversion    (Blank rows = not tested)  LOWER EXTREMITY MMT:    MMT Right Eval Left Eval  Hip flexion Left knee extension almost to neutral.  Left DF just passed neutral.  Hip extension   Hip abduction   Hip adduction   Hip internal rotation   Hip external rotation   Knee flexion   Knee extension   Ankle dorsiflexion   Ankle plantarflexion   Ankle inversion   Ankle eversion   (Blank rows = not tested)  BED MOBILITY:  Sit to supine Mod A Supine  to sit Mod A Rolling to Right Mod A Rolling to Left Mod A Undulating mattress for wound management on standard bed (elevated-so often doing uphill sliding board transfers); she would like to continue working on sitting up independently, she has been working on rolling, needs less assistance w/ this when someone props her leg into hooklying; would like something to help her pull her left leg to her butt for stretching as well as bed mobility.  TRANSFERS: Pt continues using combination of bump over, slide board, and depression (squat pivot) transfers.   FUNCTIONAL TESTS:  None relevant to pt's current functional level and abilities.  PATIENT SURVEYS:  None completed due to time.  TODAY'S TREATMENT:                                                                                                                               NMR Pt received seated in PWC. "Bump"/scapular depression transfer PWC to/from mat table with min to mod A with assist provided at posterior thighs to assist with lifting. Pt does well directing SPT to assist her with transfer with SBA from PT for safety.  Sit to stand from elevated to mat table to stedy with mod A from elevated mat table. Pt able to stand x 1 rep from mat table and x 2 reps from perched position on stedy, min A to stand from perched position. Pt able to stand about 30 sec during first 2 stands, over 1 min during last stand. Utilized [employer] for visual feedback  for midline as pt tends to shift her weight to the R through her hips, improved ability to maintain midline with visual feedback and mod manual assist needed during last stand due to fatigue. Pt also exhibits improved ability to maintain upright stance with quad and glute activation noted, heavy UE support ongoing due to level of incomplete SCI.  Remainder of session focus on core strengthening and dynamic sitting balance with close SBA to CGA for safety: Unweighted ball trunk rotations 2 x 10 reps B Unweighted ball punch-outs 2 x 10 reps Unweighted ball L/R diagonals 2 x 10 reps B Unweighted ball passes with alt L/R rotation, increased difficulty reaching outside BOS to the R and anteriorly, needs one UE bracing on LE or mat table to prevent a fall  Pt left seated in PWC at end of session.   PATIENT EDUCATION: Education details: PT POC and continue to work on standing frame at home, aquatic PT next session Person educated: Patient and Arts administrator Education method: Explanation Education comprehension: verbalized understanding  HOME EXERCISE PROGRAM: Will be established as needed as pt has done continuous therapy and is working towards functional tasks.  GOALS: Goals reviewed with patient? Yes  SHORT TERM GOALS: Target date: 05/06/2023  HEP to be established for stretching and strengthening as needed. Baseline:  To be established. Goal status: INITIAL  2.  Pt will be able to perform rolling L and R  w/ no more than minA in order to improve functional mobility. Baseline: modA Goal status: INITIAL  3.  Pt will perform sit<>supine requiring no more than minA in order to improve functional mobility. Baseline: modA Goal status: INITIAL  4.  Pt will perform bilateral reach outside seated BOS with feet supported without LOB in order to improve safety with ADL performance. Baseline: lateral LOB bilaterally demonstrated in w/c Goal status: INITIAL  LONG TERM GOALS: Target date:  06/03/2023  Pt will perform squat pivot transfer w/ no more than modA in order to improve access to home environment for toilet transfers. Baseline: pt reports recent practice with depression transfers Goal status: INITIAL  2.  Pt will perform sit<>supine requiring no more than supervision in order to improve transfers in and out of bed. Baseline:  modA Goal status: INITIAL  3.  Aquatic HEP to be established with caregiver assistance as appropriate. Baseline:  To be established. Goal status: INITIAL  4.  Pt will be able to perform rolling L and R w/ no more than supervision in order to improve functional mobility. Baseline: modA Goal status: INITIAL  5.  Pt will perform stand pivot transfer w/ no more than modA in order to improve toilet transfer for ongoing bladder training. Baseline:  To be assessed. Goal status: INITIAL  6.  Pt will stand from no more than 20 inch seat height with no more than modA to initiate transfer with maintained stand for >/=5 minutes in order to improve trunk and LE strength and stability for functional mobility and transfers. Baseline: To be assessed. Goal status: INITIAL  ASSESSMENT:  CLINICAL IMPRESSION: Emphasis of skilled PT session on working on standing tolerance and upright posture in standing as well as core strengthening/stability and dynamic sitting balance. Pt exhibits improved ability to maintain upright posture in standing this date as well as increased tolerance for standing with increased time spent maintaining stance this date. Pt does continue to exhibit impaired core strength due to her level of incomplete SCI and exhibits some difficulty reaching outside BOS without UE support or bracing on either LE or mat table. Pt also continues to do well directing her care with SPT being more hands-on with her this session. Pt can continue to benefit from skilled therapy services to work towards increased safety and independence with functional  mobility. Continue POC.   OBJECTIVE IMPAIRMENTS: decreased balance, decreased coordination, decreased mobility, difficulty walking, decreased ROM, decreased strength, hypomobility, increased edema, impaired flexibility, impaired sensation, impaired tone, impaired UE functional use, improper body mechanics, postural dysfunction, and pain.   ACTIVITY LIMITATIONS: carrying, lifting, bending, sitting, standing, squatting, stairs, transfers, bed mobility, continence, bathing, toileting, dressing, reach over head, hygiene/grooming, locomotion level, and caring for others  PARTICIPATION LIMITATIONS: meal prep, cleaning, laundry, driving, and community activity  PERSONAL FACTORS: Age, Fitness, Past/current experiences, Time since onset of injury/illness/exacerbation, and 1-2 comorbidities: intermittent AD, neurogenic bowel/bladder on active bladder training, osteoporosis  are also affecting patient's functional outcome.   REHAB POTENTIAL: Good  CLINICAL DECISION MAKING: Evolving/moderate complexity  EVALUATION COMPLEXITY: Moderate  PLAN:  PT FREQUENCY: 2x/week (1x/wk land and 1x/wk in pool)  PT DURATION: 8 weeks  PLANNED INTERVENTIONS: 97164- PT Re-evaluation, 97110-Therapeutic exercises, 97530- Therapeutic activity, 97112- Neuromuscular re-education, 97535- Self Care, [id number]- Manual therapy, [phone]- Gait training, [phone]- Orthotic Fit/training, [phone]- Aquatic Therapy, 97014- Electrical stimulation (unattended), [phone]- Electrical stimulation (manual), Patient/Family education, Balance training, Dry Needling, DME instructions, Wheelchair mobility training, Moist heat, Therapeutic exercises, Therapeutic activity, Neuromuscular re-education, Gait training,  and Self Care  PLAN FOR NEXT SESSION:  Multilevel slide board transfers vs stand pivots.  Core strength, seated reaching/trunk righting.  Bed mobility.  Use stander or stedy to practice weight-bearing and core as tolerated.  Practice stands from  progressively lowered surface. Standing with LRAD.  Pt will switch to leg bag for aquatics as she used at [place]. Pt may be going to [place] for prolonged period in November (probably not until Thanksgiving)  Peter Congo, PT Peter Congo, PT, DPT, CSRS  04/18/2023, 11:02 AM

## 2023-04-21 ENCOUNTER — Encounter: Payer: Self-pay | Admitting: Physical Therapy

## 2023-04-21 ENCOUNTER — Ambulatory Visit: Payer: Medicare PPO | Admitting: Physical Therapy

## 2023-04-21 DIAGNOSIS — R278 Other lack of coordination: Secondary | ICD-10-CM

## 2023-04-21 DIAGNOSIS — G8254 Quadriplegia, C5-C7 incomplete: Secondary | ICD-10-CM | POA: Diagnosis not present

## 2023-04-21 DIAGNOSIS — M6281 Muscle weakness (generalized): Secondary | ICD-10-CM

## 2023-04-21 DIAGNOSIS — R29898 Other symptoms and signs involving the musculoskeletal system: Secondary | ICD-10-CM | POA: Diagnosis not present

## 2023-04-21 DIAGNOSIS — R208 Other disturbances of skin sensation: Secondary | ICD-10-CM | POA: Diagnosis not present

## 2023-04-21 DIAGNOSIS — R29818 Other symptoms and signs involving the nervous system: Secondary | ICD-10-CM

## 2023-04-21 DIAGNOSIS — M24541 Contracture, right hand: Secondary | ICD-10-CM | POA: Diagnosis not present

## 2023-04-21 DIAGNOSIS — G8253 Quadriplegia, C5-C7 complete: Secondary | ICD-10-CM

## 2023-04-21 DIAGNOSIS — R293 Abnormal posture: Secondary | ICD-10-CM | POA: Diagnosis not present

## 2023-04-21 DIAGNOSIS — R2689 Other abnormalities of gait and mobility: Secondary | ICD-10-CM | POA: Diagnosis not present

## 2023-04-21 NOTE — Therapy (Signed)
OUTPATIENT PHYSICAL THERAPY NEURO TREATMENT   Patient Name: CUBIE HARGROVE MRN: [id number] DOB:1950/07/21, 72 y.o., female Today's Date: 04/21/2023   PCP: [name], MD REFERRING PROVIDER: Maryjane Hurter, MD  END OF SESSION:   PT End of Session - 04/21/23 1127     Visit Number 5    Number of Visits 17   16 + eval   Date for PT Re-Evaluation 06/10/23   to allow for scheduling delays   Authorization Type [employer] MEDICARE    Progress Note Due on Visit 10    PT Start Time 0926    PT Stop Time 1022    PT Time Calculation (min) 56 min    Equipment Utilized During Treatment Gait belt;Other (comment)   Water slide board, chair lift, bench, 2.5 lb ankle weights   Activity Tolerance Patient tolerated treatment well    Behavior During Therapy [facility] for tasks assessed/performed                Past Medical History:  Diagnosis Date   CERVICAL POLYP 03/11/2008   Qualifier: Diagnosis of  By: [name] MD, [name]    Colon polyps 2005   on colonscopy [name]   Fibroid 2004   Per Dr. [name]   History of shingles    face and mouth   Hx of skin cancer, basal cell    Rosacea    Sciatica of left side 09/28/2013   Scoliosis    noted on mri done for back pain   Past Surgical History:  Procedure Laterality Date   BUNIONECTOMY     Patient Active Problem List   Diagnosis Date Noted   Buttock wound, left, subsequent encounter 03/16/2023   Bronchiectasis with acute exacerbation (HCC) 03/15/2023   Buttock wound, left, initial encounter 11/02/2022   Orthostatic hypotension 08/13/2022   Neurogenic bowel 05/03/2022   Spasticity 05/03/2022   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/03/2022   Nerve pain 05/03/2022   Medication monitoring encounter 01/08/2022   Neurogenic bladder 10/11/2021   Urinary incontinence 10/11/2021   ESBL (extended spectrum beta-lactamase) producing bacteria infection 10/09/2021   Recurrent UTI 10/09/2021   Quadriplegia, C5-C7 incomplete (HCC) 01/16/2021   History  of spinal fracture 01/16/2021   Suprapubic catheter (HCC) 01/16/2021   Encounter for routine gynecological examination 09/28/2013   Onychomycosis 09/28/2013   Foot deformity, acquired 03/26/2012   Encounter for preventive health examination 12/25/2010   ROSACEA 08/25/2009   Disturbance in sleep behavior 03/11/2008   SKIN CANCER, HX OF 03/11/2008   DYSURIA, HX OF 03/11/2008   Hyperlipidemia 02/10/2007   CERVICALGIA 02/10/2007    ONSET DATE: 03/11/2023 (most recent referral)  REFERRING DIAG: G82.54 (ICD-10-CM) - Quadriplegia, C5-C7 incomplete  THERAPY DIAG:  Muscle weakness (generalized)  Abnormal posture  Other lack of coordination  Other symptoms and signs involving the nervous system  Quadriplegia, C5-C7 complete (HCC)  Rationale for Evaluation and Treatment: Rehabilitation  SUBJECTIVE:  SUBJECTIVE STATEMENT: Pt received seated in [facility] w/ caregiver present and ready for pool.  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 No  PRECAUTIONS: Fall; suprapubic catheter (she wants to get this removed meaning she needs to get to and from the toilet); she has had minor heat sensation when needing to complete her bowel program-possible AD?; pt has recently returned to wearing binder since pneumonia due to breathing and core stability.  Pt/caregiver performs bladder clamping prior to transfers as pt is still doing bladder training.  WEIGHT BEARING RESTRICTIONS: No-Follow fall precautions, adequate dairy in diet and exercises (aerobic, balancing and weight bearing) as tolerated. - most recent endocrinologist note.  FALLS: Has patient fallen in last 6 months? No  LIVING  ENVIRONMENT: Lives with: lives with their spouse and live-in nurse [name] Lives in: House/apartment-townhouse Stairs: No-level entry, but multi-level home 4 stories w/ elevator Has following equipment at home: Wheelchair (power), Wheelchair (manual), Grab bars, and standing frame, sliding board, transport shower chair, handheld shower head, leg strap-pt reports she no longer finds this helpful  PLOF: Requires assistive device for independence, Needs assistance with ADLs, Needs assistance with homemaking, and Needs assistance with transfers  OCCUPATION:  Writer-uses Dragon to dictate  PATIENT GOALS: "Make my right leg work."  Stand and pivot so she can more easily access a toilet.  OBJECTIVE:   DIAGNOSTIC FINDINGS:  ASSESSMENT: The BMD measured at Forearm Radius 33% is 0.506 g/cm2 with a T-score of -4.2. This patient is considered osteoporotic according to World Health Organization [facility]) criteria.  COGNITION: Overall cognitive status: Within functional limits for tasks assessed   SENSATION: Light touch: Diminished ability to distinguish sharp and dull, but able to distinguish light touch from injury level down accurately  COORDINATION: Not formally assessed.  EDEMA:  Well managed w/ lymphatic massage and compression stockings.  MUSCLE TONE: Pt has intermittent clonus during transfers.  POSTURE: rounded shoulders, posterior pelvic tilt, and right thoracic scoliosis  LOWER EXTREMITY ROM:     Passive  Right Eval Left Eval  Hip flexion See below.  Hip extension   Hip abduction   Hip adduction   Hip internal rotation   Hip external rotation   Knee flexion   Knee extension   Ankle dorsiflexion   Ankle plantarflexion   Ankle inversion   Ankle eversion    (Blank rows = not tested)  LOWER EXTREMITY MMT:    MMT Right Eval Left Eval  Hip flexion Left knee extension almost to neutral.  Left DF just passed neutral.  Hip extension   Hip abduction   Hip adduction   Hip  internal rotation   Hip external rotation   Knee flexion   Knee extension   Ankle dorsiflexion   Ankle plantarflexion   Ankle inversion   Ankle eversion   (Blank rows = not tested)  BED MOBILITY:  Sit to supine Mod A Supine to sit Mod A Rolling to Right Mod A Rolling to Left Mod A Undulating mattress for wound management on standard bed (elevated-so often doing uphill sliding board transfers); she would like to continue working on sitting up independently, she has been working on rolling, needs less assistance w/ this when someone props her leg into hooklying; would like something to help her pull her left leg to her butt for stretching as well as bed mobility.  TRANSFERS: Pt  continues using combination of bump over, slide board, and depression (squat pivot) transfers.   FUNCTIONAL TESTS:  None relevant to pt's current functional level and abilities.  PATIENT SURVEYS:  None completed due to time.  TODAY'S TREATMENT:                                                                                                                               Aquatic therapy at [facility] - pool temperature 90 degrees   Patient seen for aquatic therapy today.  Treatment took place in water 3.6-4.0 feet deep depending upon activity.  Patient entered and exited the pool via chair lift (slide board to transfer at onset and squat pivot to shower chair at end of session) at CGA-modA level (fatigued at end of session requiring more assist).   Exercises: -Passive BLE stretching all planes w/ pt supporting upright in chair lift; 2.5 lb bilateral ankles weights placed on pt to assist in maintained upright -Initial stand from chair lift with modA due to poor approximation to ground, with improved positioning pt able to maintain standing x 40 seconds CGA x3 reps into squatted position with return to upright using forearms/hands on therapist upper traps, goal of gradually decreased blocking of R knee (pt able to  stand without left knee protected), by end of third rep she is able to hold right knee into extension for several seconds independently -PT cradles patient and floats her to bench in shallow water, time spent backwards bumping onto bench for stability and practicing postioning for seated balance, unable to progress away from hand support as pt tips forward requiring modA to slowly return to upright (PT cautious of hand positioning on ribs) -Pt stands repeatedly from bench w/ progressively decreased support to CGA around posterior pelvis.  PT allowed pt to attempt improved management of R knee control in standing w/ mild knee flexion (with increased time and concentration in standing pt can better engage quads). -In standing pt performs left hip abduction step out and in x2 rounds of variable reps to fatigue w/ PT blocking right knee > forward and retro step out and in to fatigue before return to seated rest -At wall PT supports patient posteriorly and pt stands to wall to attempt RLE stepping w/ PT providing maxA to laterally step, pt fatigued so regressed to seated w/ PT posterior support as pt practices RLE abduction -Standing forward and overhead reaching for improved static balance and core control w/ min-modA posteriorly to maintain upright  Patient requires buoyancy of the water for support for reduced fall risk with gait training and balance exercises with CGA-maxA support. Exercises able to be performed safely in water without the risk of fall compared to those same exercises performed on land; viscosity of water needed for resistance for strengthening. Current of water provides perturbations for challenging static and dynamic balance.     PATIENT EDUCATION: Education details: Goal of aquatic session today, modifications to some tasks  compared to [place] due to setup variances, recommended aquatic shoes to protect toes with transfers and standing mobility. Person educated: Patient and Patent attorney Education method: Explanation Education comprehension: verbalized understanding  HOME EXERCISE PROGRAM: Will be established as needed as pt has done continuous therapy and is working towards functional tasks.  GOALS: Goals reviewed with patient? Yes  SHORT TERM GOALS: Target date: 05/06/2023  HEP to be established for stretching and strengthening as needed. Baseline:  To be established. Goal status: INITIAL  2.  Pt will be able to perform rolling L and R w/ no more than minA in order to improve functional mobility. Baseline: modA Goal status: INITIAL  3.  Pt will perform sit<>supine requiring no more than minA in order to improve functional mobility. Baseline: modA Goal status: INITIAL  4.  Pt will perform bilateral reach outside seated BOS with feet supported without LOB in order to improve safety with ADL performance. Baseline: lateral LOB bilaterally demonstrated in w/c Goal status: INITIAL  LONG TERM GOALS: Target date: 06/03/2023  Pt will perform squat pivot transfer w/ no more than modA in order to improve access to home environment for toilet transfers. Baseline: pt reports recent practice with depression transfers Goal status: INITIAL  2.  Pt will perform sit<>supine requiring no more than supervision in order to improve transfers in and out of bed. Baseline:  modA Goal status: INITIAL  3.  Aquatic HEP to be established with caregiver assistance as appropriate. Baseline:  To be established. Goal status: INITIAL  4.  Pt will be able to perform rolling L and R w/ no more than supervision in order to improve functional mobility. Baseline: modA Goal status: INITIAL  5.  Pt will perform stand pivot transfer w/ no more than modA in order to improve toilet transfer for ongoing bladder training. Baseline:  To be assessed. Goal status: INITIAL  6.  Pt will stand from no more than 20 inch seat height with no more than modA to initiate transfer with maintained  stand for >/=5 minutes in order to improve trunk and LE strength and stability for functional mobility and transfers. Baseline: To be assessed. Goal status: INITIAL  ASSESSMENT:  CLINICAL IMPRESSION: Aquatic session today focused on patient mobility and tolerance to water compared to land.  Patient was a bit hesitant with aquatics this visit primarily due to change in setup from prior facility which altered mechanics of standing for her.  She did quite well with practice standing, stepping, reaching, and working on RLE motor control.  She demonstrates improved seated mobility compared to that on land.  Her RLE volitional movement remains challenging, but PT to continue addressing as pt showed good potential today.  Will continue per POC.   OBJECTIVE IMPAIRMENTS: decreased balance, decreased coordination, decreased mobility, difficulty walking, decreased ROM, decreased strength, hypomobility, increased edema, impaired flexibility, impaired sensation, impaired tone, impaired UE functional use, improper body mechanics, postural dysfunction, and pain.   ACTIVITY LIMITATIONS: carrying, lifting, bending, sitting, standing, squatting, stairs, transfers, bed mobility, continence, bathing, toileting, dressing, reach over head, hygiene/grooming, locomotion level, and caring for others  PARTICIPATION LIMITATIONS: meal prep, cleaning, laundry, driving, and community activity  PERSONAL FACTORS: Age, Fitness, Past/current experiences, Time since onset of injury/illness/exacerbation, and 1-2 comorbidities: intermittent AD, neurogenic bowel/bladder on active bladder training, osteoporosis  are also affecting patient's functional outcome.   REHAB POTENTIAL: Good  CLINICAL DECISION MAKING: Evolving/moderate complexity  EVALUATION COMPLEXITY: Moderate  PLAN:  PT FREQUENCY: 2x/week (1x/wk land and 1x/wk  in pool)  PT DURATION: 8 weeks  PLANNED INTERVENTIONS: 97164- PT Re-evaluation, 97110-Therapeutic  exercises, 97530- Therapeutic activity, 97112- Neuromuscular re-education, [phone]- Self Care, 10932- Manual therapy, [phone]- Gait training, [phone]- Orthotic Fit/training, [phone]- Aquatic Therapy, 97014- Electrical stimulation (unattended), [phone]- Electrical stimulation (manual), Patient/Family education, Balance training, Dry Needling, DME instructions, Wheelchair mobility training, Moist heat, Therapeutic exercises, Therapeutic activity, Neuromuscular re-education, Gait training, and Self Care  PLAN FOR NEXT SESSION:  Multilevel slide board transfers vs stand pivots.  Core strength, seated reaching/trunk righting.  Bed mobility.  Use stander or stedy to practice weight-bearing and core as tolerated.  Practice stands from progressively lowered surface. Standing with LRAD.  Pt will switch to leg bag for aquatics as she used at [place]. - She has been cleared by Shanda Bumps for use of leg bag in pool and informed of need to clean well after pool due to possible irritation.  [name] (caregiver) is willing to get in pool to assist for more involved activities like tall kneel, etc. Pt may be going to [place] for prolonged period in November (probably not until Thanksgiving)  Everley Evora B Zacherie Honeyman, PT, DPT  04/21/2023, 11:41 AM

## 2023-04-22 ENCOUNTER — Ambulatory Visit: Payer: Medicare PPO | Admitting: Physical Therapy

## 2023-04-22 ENCOUNTER — Encounter: Payer: Medicare PPO | Admitting: Occupational Therapy

## 2023-04-25 ENCOUNTER — Encounter: Payer: Self-pay | Admitting: Physical Therapy

## 2023-04-25 ENCOUNTER — Ambulatory Visit: Payer: Medicare PPO | Admitting: Occupational Therapy

## 2023-04-25 ENCOUNTER — Encounter: Payer: Medicare PPO | Attending: Physical Medicine and Rehabilitation | Admitting: Physical Medicine and Rehabilitation

## 2023-04-25 ENCOUNTER — Other Ambulatory Visit: Payer: Self-pay

## 2023-04-25 ENCOUNTER — Telehealth: Payer: Self-pay

## 2023-04-25 ENCOUNTER — Encounter: Payer: Self-pay | Admitting: Physical Medicine and Rehabilitation

## 2023-04-25 ENCOUNTER — Ambulatory Visit: Payer: Medicare PPO | Admitting: Physical Therapy

## 2023-04-25 VITALS — BP 106/67 | HR 76 | Ht 64.0 in

## 2023-04-25 DIAGNOSIS — M6281 Muscle weakness (generalized): Secondary | ICD-10-CM

## 2023-04-25 DIAGNOSIS — R208 Other disturbances of skin sensation: Secondary | ICD-10-CM | POA: Diagnosis not present

## 2023-04-25 DIAGNOSIS — M24541 Contracture, right hand: Secondary | ICD-10-CM | POA: Diagnosis not present

## 2023-04-25 DIAGNOSIS — S31829A Unspecified open wound of left buttock, initial encounter: Secondary | ICD-10-CM

## 2023-04-25 DIAGNOSIS — G8254 Quadriplegia, C5-C7 incomplete: Secondary | ICD-10-CM

## 2023-04-25 DIAGNOSIS — R293 Abnormal posture: Secondary | ICD-10-CM | POA: Diagnosis not present

## 2023-04-25 DIAGNOSIS — R29818 Other symptoms and signs involving the nervous system: Secondary | ICD-10-CM

## 2023-04-25 DIAGNOSIS — Z9359 Other cystostomy status: Secondary | ICD-10-CM | POA: Diagnosis not present

## 2023-04-25 DIAGNOSIS — R278 Other lack of coordination: Secondary | ICD-10-CM

## 2023-04-25 DIAGNOSIS — R252 Cramp and spasm: Secondary | ICD-10-CM | POA: Diagnosis not present

## 2023-04-25 DIAGNOSIS — R29898 Other symptoms and signs involving the musculoskeletal system: Secondary | ICD-10-CM

## 2023-04-25 DIAGNOSIS — M24542 Contracture, left hand: Secondary | ICD-10-CM

## 2023-04-25 DIAGNOSIS — R2689 Other abnormalities of gait and mobility: Secondary | ICD-10-CM | POA: Diagnosis not present

## 2023-04-25 DIAGNOSIS — Z993 Dependence on wheelchair: Secondary | ICD-10-CM

## 2023-04-25 NOTE — Therapy (Signed)
OUTPATIENT PHYSICAL THERAPY NEURO TREATMENT   Patient Name: [name] MRN: [id number] DOB:1950-12-12, 72 y.o., female Today's Date: 04/25/2023   PCP: Carmen Headings, MD REFERRING PROVIDER: Maryjane Hurter, MD  END OF SESSION:   PT End of Session - 04/25/23 1634     Visit Number 6    Number of Visits 17   16 + eval   Date for PT Re-Evaluation 06/10/23   to allow for scheduling delays   Authorization Type [employer] MEDICARE    Progress Note Due on Visit 10    PT Start Time 1405    PT Stop Time 1450    PT Time Calculation (min) 45 min    Equipment Utilized During Treatment Gait belt    Activity Tolerance Patient tolerated treatment well    Behavior During Therapy [facility] for tasks assessed/performed                Past Medical History:  Diagnosis Date   CERVICAL POLYP 03/11/2008   Qualifier: Diagnosis of  By: Carmen Sharp MD, [name]    Colon polyps 2005   on colonscopy [name]   Fibroid 2004   Per Dr. Dareen [name]   History of shingles    face and mouth   Hx of skin cancer, basal cell    Rosacea    Sciatica of left side 09/28/2013   Scoliosis    noted on mri done for back pain   Past Surgical History:  Procedure Laterality Date   BUNIONECTOMY     Patient Active Problem List   Diagnosis Date Noted   Buttock wound, left, subsequent encounter 03/16/2023   Bronchiectasis with acute exacerbation (HCC) 03/15/2023   Buttock wound, left, initial encounter 11/02/2022   Orthostatic hypotension 08/13/2022   Neurogenic bowel 05/03/2022   Spasticity 05/03/2022   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/03/2022   Nerve pain 05/03/2022   Medication monitoring encounter 01/08/2022   Neurogenic bladder 10/11/2021   Urinary incontinence 10/11/2021   ESBL (extended spectrum beta-lactamase) producing bacteria infection 10/09/2021   Recurrent UTI 10/09/2021   Quadriplegia, C5-C7 incomplete (HCC) 01/16/2021   History of spinal fracture 01/16/2021   Suprapubic catheter (HCC) 01/16/2021    Encounter for routine gynecological examination 09/28/2013   Onychomycosis 09/28/2013   Foot deformity, acquired 03/26/2012   Encounter for preventive health examination 12/25/2010   ROSACEA 08/25/2009   Disturbance in sleep behavior 03/11/2008   SKIN CANCER, HX OF 03/11/2008   DYSURIA, HX OF 03/11/2008   Hyperlipidemia 02/10/2007   CERVICALGIA 02/10/2007    ONSET DATE: 03/11/2023 (most recent referral)  REFERRING DIAG: G82.54 (ICD-10-CM) - Quadriplegia, C5-C7 incomplete  THERAPY DIAG:  Muscle weakness (generalized)  Abnormal posture  Other lack of coordination  Other symptoms and signs involving the nervous system  Rationale for Evaluation and Treatment: Rehabilitation  SUBJECTIVE:  SUBJECTIVE STATEMENT: Pt received seated in [facility] w/ caregiver present.  They deny recent falls.  Pt states she recently saw physiatrist and was informed to expect most progress in core musculature over time vs extremities so she would like to continue focusing on that.  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 No - same as in her forearms at baseline  PRECAUTIONS: Fall; suprapubic catheter (she wants to get this removed meaning she needs to get to and from the toilet); she has had minor heat sensation when needing to complete her bowel program-possible AD?; pt has recently returned to wearing binder since pneumonia due to breathing and core stability.  Pt/caregiver performs bladder clamping prior to transfers as pt is still doing bladder training.  WEIGHT BEARING RESTRICTIONS: No-Follow fall precautions, adequate dairy in diet and exercises (aerobic, balancing and weight bearing) as  tolerated. - most recent endocrinologist note.  FALLS: Has patient fallen in last 6 months? No  LIVING ENVIRONMENT: Lives with: lives with their spouse and live-in nurse [name] Lives in: House/apartment-townhouse Stairs: No-level entry, but multi-level home 4 stories w/ elevator Has following equipment at home: Wheelchair (power), Wheelchair (manual), Grab bars, and standing frame, sliding board, transport shower chair, handheld shower head, leg strap-pt reports she no longer finds this helpful  PLOF: Requires assistive device for independence, Needs assistance with ADLs, Needs assistance with homemaking, and Needs assistance with transfers  OCCUPATION:  Writer-uses Dragon to dictate  PATIENT GOALS: "Make my right leg work."  Stand and pivot so she can more easily access a toilet.  OBJECTIVE:   DIAGNOSTIC FINDINGS:  ASSESSMENT: The BMD measured at Forearm Radius 33% is 0.506 g/cm2 with a T-score of -4.2. This patient is considered osteoporotic according to World Health Organization [facility]) criteria.  COGNITION: Overall cognitive status: Within functional limits for tasks assessed   SENSATION: Light touch: Diminished ability to distinguish [name] and dull, but able to distinguish light touch from injury level down accurately  COORDINATION: Not formally assessed.  EDEMA:  Well managed w/ lymphatic massage and compression stockings.  MUSCLE TONE: Pt has intermittent clonus during transfers.  POSTURE: rounded shoulders, posterior pelvic tilt, and right thoracic scoliosis  LOWER EXTREMITY ROM:     Passive  Right Eval Left Eval  Hip flexion See below.  Hip extension   Hip abduction   Hip adduction   Hip internal rotation   Hip external rotation   Knee flexion   Knee extension   Ankle dorsiflexion   Ankle plantarflexion   Ankle inversion   Ankle eversion    (Blank rows = not tested)  LOWER EXTREMITY MMT:    MMT Right Eval Left Eval  Hip flexion Left knee extension  almost to neutral.  Left DF just passed neutral.  Hip extension   Hip abduction   Hip adduction   Hip internal rotation   Hip external rotation   Knee flexion   Knee extension   Ankle dorsiflexion   Ankle plantarflexion   Ankle inversion   Ankle eversion   (Blank rows = not tested)  BED MOBILITY:  Sit to supine Mod A Supine to sit Mod A Rolling to Right Mod A Rolling to Left Mod A Undulating mattress for wound management on standard bed (elevated-so often doing uphill sliding board transfers); she would like to continue working on sitting up independently, she has  been working on rolling, needs less assistance w/ this when someone props her leg into hooklying; would like something to help her pull her left leg to her butt for stretching as well as bed mobility.  TRANSFERS: Pt continues using combination of bump over, slide board, and depression (squat pivot) transfers.   FUNCTIONAL TESTS:  None relevant to pt's current functional level and abilities.  PATIENT SURVEYS:  None completed due to time.  TODAY'S TREATMENT:                                                                                                                               Pt performs scapular depression transfer to left x3 w/ PT min-modA supporting upper thighs per pt preference.  Once sitting EOM 2" step placed under feet to improve sitting balance and LE alignment. -Caregiver assists in lift from front of patient w/ PT placing green dynadisc under patient bottom, pt also supporting herself and assisting w/ lift from EOM > practice progressing to unassisted sitting balance w/ PT seated behind patient providing support at edge of stability > progressed to bean bag and bean bag ball toss to crossbody target w/ gradual integration of RUE to task (pt has less dexterity in RUE so more difficulty gripping for toss and some pronounced weakness in left trunk worse than right so right lean making RUE use more difficult),  several rounds performed > same lift and assist provided to remove dynadisc -Pt seated on mat w/ caregiver providing SBA at front of patient and PT guarding and providing resistance posteriorly > pt performs anterior and lateral leans w/ green loop band for trunk engagement, able to progress to no UE support and external target progression anteriorly, but pt needs BUE support for left lean with increased rotary compensation noted due to functional weakness, tolerates right leans well, but is fatigued with task progression -Left stand pivot modA (w/ step removed from under feet) to PWC at end of session using hands on therapist shoulder w/ posterior pelvic support, caregiver assist and chair feature use to position after transfer, OT providing SBA during transfer for safety due to fatigue and handoff to assess patient's LUE engagement.  PATIENT EDUCATION: Education details: Goals of session today and deficits noted. Person educated: Patient and Arts administrator Education method: Explanation Education comprehension: verbalized understanding  HOME EXERCISE PROGRAM: Will be established as needed as pt has done continuous therapy and is working towards functional tasks.  GOALS: Goals reviewed with patient? Yes  SHORT TERM GOALS: Target date: 05/06/2023  HEP to be established for stretching and strengthening as needed. Baseline:  To be established. Goal status: INITIAL  2.  Pt will be able to perform rolling L and R w/ no more than minA in order to improve functional mobility. Baseline: modA Goal status: INITIAL  3.  Pt will perform sit<>supine requiring no more than minA in order to improve functional mobility. Baseline: modA Goal status: INITIAL  4.  Pt will perform bilateral reach outside seated BOS with feet supported without LOB in order to improve safety with ADL performance. Baseline: lateral LOB bilaterally demonstrated in w/c Goal status: INITIAL  LONG TERM GOALS: Target date:  06/03/2023  Pt will perform squat pivot transfer w/ no more than modA in order to improve access to home environment for toilet transfers. Baseline: pt reports recent practice with depression transfers Goal status: INITIAL  2.  Pt will perform sit<>supine requiring no more than supervision in order to improve transfers in and out of bed. Baseline:  modA Goal status: INITIAL  3.  Aquatic HEP to be established with caregiver assistance as appropriate. Baseline:  To be established. Goal status: INITIAL  4.  Pt will be able to perform rolling L and R w/ no more than supervision in order to improve functional mobility. Baseline: modA Goal status: INITIAL  5.  Pt will perform stand pivot transfer w/ no more than modA in order to improve toilet transfer for ongoing bladder training. Baseline:  To be assessed. Goal status: INITIAL  6.  Pt will stand from no more than 20 inch seat height with no more than modA to initiate transfer with maintained stand for >/=5 minutes in order to improve trunk and LE strength and stability for functional mobility and transfers. Baseline: To be assessed. Goal status: INITIAL  ASSESSMENT:  CLINICAL IMPRESSION: Primary focus of skilled PT session today on core engagement for improved balance and coordination of UE use during transfers and other functional mobility.  Pt was fatigued by end of session due to dynamic nature of task on dynadisc.  She would benefit from further practice with lateral core engagement and stand pivot transfers as stand pivot performed at end of session was not accurate reflection of pt potential due to fatigue.  Will continue per POC.   OBJECTIVE IMPAIRMENTS: decreased balance, decreased coordination, decreased mobility, difficulty walking, decreased ROM, decreased strength, hypomobility, increased edema, impaired flexibility, impaired sensation, impaired tone, impaired UE functional use, improper body mechanics, postural dysfunction,  and pain.   ACTIVITY LIMITATIONS: carrying, lifting, bending, sitting, standing, squatting, stairs, transfers, bed mobility, continence, bathing, toileting, dressing, reach over head, hygiene/grooming, locomotion level, and caring for others  PARTICIPATION LIMITATIONS: meal prep, cleaning, laundry, driving, and community activity  PERSONAL FACTORS: Age, Fitness, Past/current experiences, Time since onset of injury/illness/exacerbation, and 1-2 comorbidities: intermittent AD, neurogenic bowel/bladder on active bladder training, osteoporosis  are also affecting patient's functional outcome.   REHAB POTENTIAL: Good  CLINICAL DECISION MAKING: Evolving/moderate complexity  EVALUATION COMPLEXITY: Moderate  PLAN:  PT FREQUENCY: 2x/week (1x/wk land and 1x/wk in pool)  PT DURATION: 8 weeks  PLANNED INTERVENTIONS: 44034- PT Re-evaluation, 97110-Therapeutic exercises, 97530- Therapeutic activity, 97112- Neuromuscular re-education, 97535- Self Care, 74259- Manual therapy, [phone]- Gait training, [phone]- Orthotic Fit/training, [phone]- Aquatic Therapy, 97014- Electrical stimulation (unattended), [phone]- Electrical stimulation (manual), Patient/Family education, Balance training, Dry Needling, DME instructions, Wheelchair mobility training, Moist heat, Therapeutic exercises, Therapeutic activity, Neuromuscular re-education, Gait training, and Self Care  PLAN FOR NEXT SESSION:  Multilevel slide board transfers vs stand pivots.  Core strength, seated reaching/trunk righting.  Bed mobility.  Use stander or stedy to practice weight-bearing and core as tolerated.  Practice stands from progressively lowered surface. Standing with LRAD.  Supine/side-lying lateral trunk engagement/strengthening.  Pt will switch to leg bag for aquatics as she used at [place]. - She has been cleared by Shanda Bumps for use of leg bag in pool and informed of need to  clean well after pool due to possible irritation.  [name] (caregiver) is  willing to get in pool to assist for more involved activities like tall kneel, etc. Pt may be going to [place] for prolonged period in November (probably not until Thanksgiving)  Cristi Gwynn B Myan Suit, PT, DPT  04/25/2023, 4:35 PM

## 2023-04-25 NOTE — Telephone Encounter (Signed)
Teriparatide (FORTEO) 600 MCG/2.4ML SOPN needs a prior auth , please advise thanks

## 2023-04-25 NOTE — Progress Notes (Signed)
Subjective:    Patient ID: [name], female    DOB: 12/02/1950, 72 y.o.   MRN: [id number]  HPI  Pt is a 72 yr old L handed female with hx of incomplete quadriplegia- 2/14 2022- fleeing the police in [place] on passenger 100 (high speed) miles/hour,  [name]; neurogenic bowel and bladder and spasticity; [name], [name] Here for f/u on Incomplete quadriplegia   Aide; [name]   Things somewhat complicated lately. [name] in August- very mild-     Went back to [place] for September and got Pneumonia- in hospital last week   Last week, gained 7 lbs in 2 weeks- and reduced urine output.  Took to ED- to look for CHF/Renal failure- wasn't either.  Their theory, had low weight- 106 lbs- in September- rehydrating and recovery from starvation.   Lost 2 of the lbs in last week. Concerned about decreased urine output. Usually 1200-1400cc- intake is the same- output is 500-700 cc last few weeks.   Has been a full month having decreased urine outut.   Yesterday voided a little through [facility]-  This Am passing blood clots- tiny- smaller than pnecil eraser but ~ 10 of them this AM.    Trying to get in [place] for osteoporosis. Started on [place]-    Urology- is Dr Katrinka Blazing- hasn't called Dr Katrinka Blazing yet.   Backside- on L ischial- a cyst or boil- looks like coming to a head last evening, but picture from this AM, looks a lot better.   Has continued with PT and OT-  2 -3 days/week- hasn't stopped since was in [place] initially.  And then intensive therapy when went back to [place].   Hadn't changed a lot in strength from 2nd to 3rd [facility] visit, but more coordinated.  Aqua therapy went really well.  Doing here since got back ~ 1x/week. And has PT on dry land 1x/week.    Planning on selling place in [place].  Very hard to get back and forth and upkeep hard to keep it. Needs to simplify.    Research are ARC-X therapy electrode on neck and on clavicles- for  ground, got high powered unit- 72-74% improvements in arm and finger dexterity.  Doesn't have FDA approval.     Pelvic floor training. Got at [facility]- needs some guidance.  Could help bowel and bladder and posture.   Having spasticity- about the same- usually the same- rare spasms during day- but some in AM when wakes up.    Pain Inventory Average Pain 4 Pain Right Now 4 My pain is burning  LOCATION OF PAIN  elbow,wrist, hand, fingers  BOWEL Number of stools per week: 2-3 Oral laxative use Yes  Type of laxative bisacodyl/suppository Enema or suppository use Yes   BLADDER Suprapubic Passing blood clots today   Mobility use a wheelchair needs help with transfers Do you have any goals in this area?  yes  Function employed # of hrs/week 30 I need assistance with the following:  dressing, bathing, toileting, meal prep, household duties, and shopping Do you have any goals in this area?  yes  Neuro/Psych bladder control problems bowel control problems weakness trouble walking spasms  Prior Studies Any changes since last visit?  no  Physicians involved in your care Any changes since last visit?  no   Family History  Problem Relation Age of Onset   Hypertension Father    Osteoporosis Other    Breast cancer  Neg Hx    Social History   Socioeconomic History   Marital status: Married    Spouse name: Not on file   Number of children: Not on file   Years of education: Not on file   Highest education level: Doctorate  Occupational History   Not on file  Tobacco Use   Smoking status: Never   Smokeless tobacco: Never  Vaping Use   Vaping status: Never Used  Substance and Sexual Activity   Alcohol use: Not Currently    Alcohol/week: 7.0 standard drinks of alcohol    Types: 7 Glasses of wine per week   Drug use: Yes    Comment: wine at night   Sexual activity: Not on file  Other Topics Concern   Not on file  Social History Narrative   Married   Spouse  had CABG    [employer] professor PhD   [place] [place]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   Had moved to [place]   hh of 2    Quadripareisis from [employer] injury     No current pets       Social Determinants of Corporate investment banker Strain: Low Risk  (03/07/2023)   Received from [employer]   Overall Financial Resource Strain (CARDIA)    Difficulty of Paying Living Expenses: Not hard at all  Food Insecurity: No Food Insecurity (03/07/2023)   Received from [employer] Vital Sign    Worried About Running Out of Food in the Last Year: Never true    Ran Out of Food in the Last Year: Never true  Transportation Needs: No Transportation Needs (03/07/2023)   Received from [employer] - Administrator, Civil Service (Medical): No    Lack of Transportation (Non-Medical): No  Physical Activity: Insufficiently Active (10/14/2022)   Exercise Vital Sign    Days of Exercise per Week: 2 days    Minutes of Exercise per Session: 60 min  Stress: No Stress Concern Present (03/07/2023)   Received from [employer] of Occupational Health - Occupational Stress Questionnaire    Feeling of Stress : Not at all  Social Connections: Moderately Integrated (10/14/2022)   Social Connection and Isolation Panel [NHANES]    Frequency of Communication with Friends and Family: More than three times a week    Frequency of Social Gatherings with Friends and Family: Once a week    Attends Religious Services: Never    Database administrator or Organizations: Yes    Attends Engineer, structural: More than 4 times per year    Marital Status: Married   Past Surgical History:  Procedure Laterality Date   BUNIONECTOMY     Past Medical History:  Diagnosis Date   CERVICAL POLYP 03/11/2008   Qualifier: Diagnosis of  By: [name] MD, [name]    Colon polyps 2005   on colonscopy [name]   Fibroid 2004   Per Dr. [name]   History of shingles    face and mouth   Hx  of skin cancer, basal cell    Rosacea    Sciatica of left side 09/28/2013   Scoliosis    noted on mri done for back pain   BP 106/67   Pulse 76   Ht 5\' 4"  (1.626 m)   SpO2 96%   BMI 19.74 kg/m   Opioid Risk Score:   Fall Risk Score:  `1  Depression screen PHQ  2/9     04/25/2023   10:14 AM 03/15/2023    3:19 PM 10/13/2022    9:40 AM 08/13/2022   12:36 PM 03/02/2022   12:05 PM 10/09/2021    1:51 PM 02/24/2021    2:39 PM  Depression screen PHQ 2/9  Decreased Interest 0 0 0 0 0 0 0  Down, Depressed, Hopeless 0 0 0 0 0 0 0  PHQ - 2 Score 0 0 0 0 0 0 0      Review of Systems  Musculoskeletal:  Positive for gait problem.       Elbow, Wrist, Hand, Fingers  All other systems reviewed and are negative.     Objective:   Physical Exam  Awake, alert, appropriate, wrapped up because cold; accompanied by [name]; in power w/c; NAD Looked at pictures of backside- L ischial area- less than dime sized- mild to moderate erythema- surrounding what looks like boil/large pimple area.  Picture of urine this AM- had ~ 10 tiny blood clots- in light amber urine.      Assessment & Plan:   Pt is a 72 yr old L handed female with hx of incomplete quadriplegia- 2/14 2022- fleeing the police in [place] on passenger 100 (high speed) miles/hour,  [name]; neurogenic bowel and bladder and spasticity; [name], [name] Here for f/u on Incomplete quadriplegia    1. Place Muciprocin BID/2x/day- on boil appearing lesion on L ischial area. Foam over top and monitor closely- can use warm compresses vs hand heating pads- to avoid the wet area from warm compresses. Do while on side.    2.   Suggest getting Urinalysis and Urine Culture- and call Dr Katrinka Blazing about small blood clots- but ~ 10 of them this AM.    3.  PCP -call them about drop in Hemoglobin- try to have them check for microscopic blood in stool. Might need Cologuard (last one was negative 1 year ago)   Last colonoscopy- at least 10  years ago- doesn't think had polyps- was tortuous- but no polyps. -   4. Plans on taking a small break from PT/OT- when in [place].    5. Continue ARC-X electrotherapy.    6. Will refer to [facility] for pelvic floor therapy/training- waiting to see where she's at/and how to place referral.   7.  F/U in in [place] when comes back double visit- SCI  8. Baclofen 40 mg and Myrbetriq - has refills.    I spent a total of  35  minutes on total care today- >50% coordination of care- due to d/w pt about meds; blood clots in urine and referral to Pelvic floor therapy.

## 2023-04-25 NOTE — Patient Instructions (Signed)
Pt is a 72 yr old L handed female with hx of incomplete quadriplegia- 2/14 2022- fleeing the police in [place] on passenger 100 (high speed) miles/hour,  [name]; neurogenic bowel and bladder and spasticity; [name], [name] Here for f/u on Incomplete quadriplegia    1. Place Muciprocin BID/2x/day- on boil appearing lesion on L ischial area. Foam over top and monitor closely- can use warm compresses vs hand heating pads- to avoid the wet area from warm compresses. Do while on side.    2.   Suggest getting Urinalysis and Urine Culture- and call Dr Katrinka Blazing about small blood clots- but ~ 10 of them this AM.    3.  PCP -call them about drop in Hemoglobin- try to have them check for microscopic blood in stool. Might need Cologuard (last one was negative 1 year ago)   Last colonoscopy- at least 10 years ago- doesn't think had polyps- was tortuous- but no polyps. -   4. Plans on taking a small break from PT/OT- when in [place].    5. Continue ARC-X electrotherapy.    6. Will refer to [facility] for pelvic floor therapy/training- waiting to see where she's at/and how to place referral.   7.  F/U in in [place] when comes back double visit- SCI

## 2023-04-25 NOTE — Patient Instructions (Addendum)
  Hold 5-10 seconds each, complete as tolerated if in same position for awhile, side-to-side more frequent

## 2023-04-25 NOTE — Therapy (Signed)
OUTPATIENT OCCUPATIONAL THERAPY NEURO TREATMENT  Patient Name: [name] MRN: [id number] DOB:[birth year], 72 y.o., female Today's Date: 04/25/2023  PCP: [name], MD  REFERRING PROVIDER: Maryjane Hurter, MD   END OF SESSION:  OT End of Session - 04/25/23 1537     Visit Number 5    Number of Visits 17    Date for OT Re-Evaluation 06/10/23    Authorization Type [employer] - requires auth, [place]    Progress Note Due on Visit 10    OT Start Time 0252    OT Stop Time 0330    OT Time Calculation (min) 38 min    Activity Tolerance Patient tolerated treatment well    Behavior During Therapy [facility] for tasks assessed/performed               Past Medical History:  Diagnosis Date   CERVICAL POLYP 03/11/2008   Qualifier: Diagnosis of  By: [name] MD, [name]    Colon polyps 2005   on colonscopy [name]   Fibroid 2004   Per Dr. [name]   History of shingles    face and mouth   Hx of skin cancer, basal cell    Rosacea    Sciatica of left side 09/28/2013   Scoliosis    noted on mri done for back pain   Past Surgical History:  Procedure Laterality Date   BUNIONECTOMY     Patient Active Problem List   Diagnosis Date Noted   Buttock wound, left, subsequent encounter 03/16/2023   Bronchiectasis with acute exacerbation (HCC) 03/15/2023   Buttock wound, left, initial encounter 11/02/2022   Orthostatic hypotension 08/13/2022   Neurogenic bowel 05/03/2022   Spasticity 05/03/2022   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/03/2022   Nerve pain 05/03/2022   Medication monitoring encounter 01/08/2022   Neurogenic bladder 10/11/2021   Urinary incontinence 10/11/2021   ESBL (extended spectrum beta-lactamase) producing bacteria infection 10/09/2021   Recurrent UTI 10/09/2021   Quadriplegia, C5-C7 incomplete (HCC) 01/16/2021   History of spinal fracture 01/16/2021   Suprapubic catheter (HCC) 01/16/2021   Encounter for routine gynecological examination 09/28/2013   Onychomycosis  09/28/2013   Foot deformity, acquired 03/26/2012   Encounter for preventive health examination 12/25/2010   ROSACEA 08/25/2009   Disturbance in sleep behavior 03/11/2008   SKIN CANCER, HX OF 03/11/2008   DYSURIA, HX OF 03/11/2008   Hyperlipidemia 02/10/2007   CERVICALGIA 02/10/2007    ONSET DATE: 07/28/2020  Date of Referral 03/23/2023  REFERRING DIAG: G82.54 (ICD-10-CM) - Quadriplegia, C5-C7, incomplete (HCC)  THERAPY DIAG:  Other lack of coordination  Muscle weakness (generalized)  Other symptoms and signs involving the nervous system  Contracture of hand joint, right  Contracture of hand joint, left  Other symptoms and signs involving the musculoskeletal system  Other disturbances of skin sensation  Quadriplegia, C5-C7 incomplete (HCC)  Rationale for Evaluation and Treatment: Rehabilitation  SUBJECTIVE:   SUBJECTIVE STATEMENT: Pt reports things are going alright. Pt reports completing aquatic therapy with PT last week, which went very well. Pt reports significant fatigue after PT session today. Pt reports typing is going well, using hunt-and-peck (requires more time) and dictation (requires more editing).   Pt reports continuing to "play around" with e-stim unit which caused a burning sensation at pt's neck recently with pt confirming no burns occurred. Pt and [name] reported looking for newest electrodes to prevent burns because the electrodes are more likely to burn as the electrodes get older.  Pt reported no e-stim unit today  because pt and caregiver accidentally forgot to bring the e-stim unit to OT session. Pt confirmed intent to bring e-stim unit at next OT visit.   Pt accompanied by:  Live in Caregiver - [name]   PERTINENT HISTORY: "Pt is a 71 yr old L handed female with hx of incomplete quadriplegia- 2/14 2022- fleeing the police in [place] on passenger 100 (high speed) miles/hour,  [name]; neurogenic bowel and bladder and spasticity; [name], has low BP  and HLD. Here for f/u on Incomplete quadriplegia"  PRECAUTIONS: Fall; suprapubic catheter (she wants to get this removed meaning she needs to get to and from the toilet); she has had minor heat sensation when needing to complete her bowel program-possible AD?   WEIGHT BEARING RESTRICTIONS: No  PAIN: - reports average pain as noted below. Will notify therapist if there are changes in her pain.  Are you having pain? Yes: NPRS scale: 3/10 Pain location: fingers to elbow bilaterally Pain description: constant Aggravating factors: it can increased over time ie) is worse at the end of the day.  Also cold affects cramps and function. Relieving factors: gabapentin and baclofen for spasms, nightly stretching  FALLS: Has patient fallen in last 6 months? No  LIVING ENVIRONMENT: Lives with: lives with their family - husband [name] and with an adult companion s/p moving back up from [place] x10 months Lives in: House/apartment Stairs:  4 story town house with an Engineer, structural with threshold adjustments, roll in shower with transport chair Has following equipment at home: Wheelchair (power) - with seat height adjustments to access counters and reclining option, Wheelchair (manual), transport WC, shower chair, and Ramped entry, handheld showerhead with rails around toilet, had Michiel Sites but is no longer in need of it, has slide boards x3  PLOF: Requires assistive device for independence, Needs assistance with ADLs, Needs assistance with homemaking, Needs assistance with gait, and Needs assistance with transfers; 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  Used to like to knit, sew and bake.  PATIENT GOALS: improve L shoulder pain, ROM, and strength; core strength  OBJECTIVE:   HAND DOMINANCE: Left  ADLs: Overall ADLs: Patient has a live in caregiver  Transfers/ambulation related to ADLs: min assist with sliding board transfers.  Eating: Has a rocker knife that she can use. Used to use adapted  utensils but now uses regular utensils but still will get assistance to cut food ie) when eating out.  Grooming: can brush her own hair with LUE only; unable to manage jewelry ie) earrings  UB Dressing: can zip/unzip after it has been started, unable to manage buttons herself, Caregiver assists but if she has extra time, she can put on her bra, and a loose fitting pullover shirt/t-shirt  LB Dressing: dependent for LB dressing in bed and with special sock donner for LE compression garments   Toileting: bladder trained with suprapubic catheter which she clamps off.  Dependent for bowel incontinence care.  Bathing: Sponge bath with adult washclothes.  Can bathe UB with back scrubber for most of her back.  Needs help with feet (mentioned she might need a separate brush for feet)   Tub Shower transfers: Min assist with slide board  Equipment: Shower seat with back, Walk in shower, bed side commode, Reacher, Sock aid, Long handled sponge, and Feeding equipment  IADLs: --  Shopping: Assisted by caregiver  Light housekeeping: Has housekeeper that comes monthly  Meal Prep: previously enjoyed baking. Assisted by caregiver but has reheated a  meal for herself after getting food out of the fridge/freezer from her WC.  Community mobility: Dependent  Medication management: Caregiver sorts them into pillbox but she is very aware of her medications   Financial management: Patient manages her own finances  Handwriting: Increased time and has a pen with a little grip  MOBILITY STATUS: Independent with power mobility  ACTIVITY TOLERANCE: Activity tolerance: good to Fair - MMT WFL but has limited sustained tolerance for ongoing use of Ues with poor trunk control  FUNCTIONAL OUTCOME MEASURES:   Total score = sum of the activity scores/number of activities Minimum detectable change (90%CI) for average score = 2 points Minimum detectable change (90%CI) for single activity score = 3 points   UPPER  EXTREMITY ROM:   AROM - WFL without obvious contractures, some digital flexion noted but PROM WNL   UPPER EXTREMITY MMT:   Grossly WFL - Endurance limited R tricep strength > than L but L UE generally stronger than R UE  MMT Right (eval) Left (eval)  Shoulder flexion 4/5 4/5  Shoulder abduction 4/5 4/5  Elbow flexion 4/5 4/5  Elbow extension 4/5 4-/5  (Blank rows = not tested)  HAND FUNCTION: Grip strength: Right: 5.2 lbs; Left: 15.6 lbs  COORDINATION: 9 Hole Peg test: Right: able to place all 9 and remove 4 pegs in 3 minutes sec; Left: 84 sec R hand finger eventually cramps and dexterity worsens in the cold  SENSATION: Light touch: Impaired  - patient   EDEMA: NA for UEs but LE has poor lymph drainage with custom compression garments   MUSCLE TONE: Generally WFL   COGNITION: Overall cognitive status: Within functional limits for tasks assessed  VISION: Subjective report: Patent wears progressive lens/glasses.  Denies diplopia or vision changes. Baseline vision: Wears glasses all the time  VISION ASSESSMENT: WFL  OBSERVATIONS: Patient independent with power WC navigation within clinic.  Patient is well-kept with foley catheter in place.  She has slight limitations in full extension of digits but PROM is WNL.    TODAY'S TREATMENT:                                                                                         Therapeutic Activities  OT briefly assisted with sit-to-stand transfer from mat to w/c at end of pt's PT session. Pt then transitioned to table for OT session in power w/c.  OT educated pt on weight-shifting and pressure relief in w/c (handout provided, see pt instructions). - for pressure relief, to prevent skin breakdown, to increase BUE strengthening in preparation for transfers -  Pt demo'd understanding. Pt reported increased fatigue with pushing up from w/c with hands. OT educated pt to only complete forward reach and push-up pressure relief with  caregiver or family member present for safety. Pt confirmed understanding.  Blaze pods - OT placed 6 BlazePods in front of patient at tabletop level and had patient tap pods bimanually as pods lit up using OT distraction and scanning mode for improved processing, scanning and locating of items, reaction time, upper extremity range of motion, gross motor coordination, functional reach with BUE, weight shifting during functional tasks,  fine motor coordination, and bimanual coordination/trunk control. Results as follows: Trial 1 LUE (fingertips) - 71 hits, 1.5 second reaction time Trial 2 LUE (lateral side of hand) - 101 hits, .993 seconds reaction time  Trial 1 RUE - (proximal palm) - 84 hits, 1.2 second reaction time Trial 2 - RUE - (lateral side of hand) - 79 hits, 1.3 second reaction time   Placing small pegs on pegboard with LUE and removing with RUE - to improve FM coordination, dexterity, precision, grasping. - Pt copied 24 peg 2-color design on pegboard ind.  PATIENT EDUCATION: Education details: see today's treatment above Person educated: Patient and Caregiver Live in caregiver - [name] Education method: Explanation, Demonstration, Verbal cues, and Handouts Education comprehension: verbalized understanding, returned demonstration, and needs further education  HOME EXERCISE PROGRAM: 04/25/23 - Weight-shifting (handout provided, see pt instructions)  GOALS:   SHORT TERM GOALS: Target date: 05/06/2023    Patient will be able to use AE/modified techniques to cut soft foods small enough for oral intake. Baseline: Caregiver/spouse assist Goal status: INITIAL  2.  Pt will be independent with BUE braces/splints as needed to prevent contracture and improve functional use of hands.  Baseline: Caregiver/spouse assist; pt lacks carryover from [facility] Goal status: IN progress  3.  Pt will be independent with use of ARCEX device as needed for carryover and improvement of BUE  function.  Baseline: Caregiver/spouse assist; unable to independently recall protocol for use Goal status: IN progress   LONG TERM GOALS: Target date: 06/10/23   Patient will demonstrate independence with updated HEP for UE strengthening, coordination and ROM to prevent contractures and maintain strength for transfers and ADLs. Baseline: Previous HEPs have been established but need to be reviewed and updated.  Goal status: INITIAL  2.  Patient will report at least two-point increase in average PSFS score or at least three-point increase in a single activity score indicating functionally significant improvement given minimum detectable change.  Baseline: 0.5 total score (See above for individual activity scores)   3.  Pt will demonstrate ability to place 5lb item bimanually on table at least 1' in front of her without using support of elbows, without leaning against back of chair, and with good control.  Baseline: unable Goal status: INITIAL  4.  Pt will report increased ability to push up through LUE with completion of transfers. Baseline: unable Goal status: INITIAL   ASSESSMENT:  CLINICAL IMPRESSION: Pt tolerated tasks well today though demo'd increased fatigue today following back-to-back PT and OT sessions. Recommend continued core and fine motor coordination work with modified ArcEx. Pt would benefit from skilled OT services to address deficits and increase overall independence.    PERFORMANCE DEFICITS: in functional skills including ADLs, IADLs, coordination, dexterity, strength, muscle spasms, Fine motor control, Gross motor control, continence, skin integrity, and UE functional use,   IMPAIRMENTS: are limiting patient from ADLs, IADLs, work, and leisure.   CO-MORBIDITIES: has co-morbidities such as incontinence and wound  that affects occupational performance. Patient will benefit from skilled OT to address above impairments and improve overall function.  REHAB POTENTIAL:  Fair due to chronicity of injury  PLAN:  OT FREQUENCY: 2x/week  OT DURATION: 8 weeks  PLANNED INTERVENTIONS: self care/ADL training, therapeutic exercise, therapeutic activity, neuromuscular re-education, manual therapy, passive range of motion, balance training, functional mobility training, splinting, patient/family education, energy conservation, coping strategies training, and DME and/or AE instructions  RECOMMENDED OTHER SERVICES: Patient was seen for PT evaluation today with treatment plans coordinated  for 2x/week.  CONSULTED AND AGREED WITH PLAN OF CARE: Patient and family member/caregiver  PLAN FOR NEXT SESSION:   Typing tasks at computer - dictation vs. Hunt-and-peck method Will review splints/splint schedule with patient.   Initiate ROM HEP.   Explore FM tasks  Review EazyHold (silicone strap) for hair brush PRN  Wynetta Emery, OT 04/25/2023, 3:45 PM

## 2023-04-26 ENCOUNTER — Telehealth: Payer: Self-pay | Admitting: Internal Medicine

## 2023-04-26 ENCOUNTER — Other Ambulatory Visit (HOSPITAL_COMMUNITY): Payer: Self-pay

## 2023-04-26 ENCOUNTER — Other Ambulatory Visit (INDEPENDENT_AMBULATORY_CARE_PROVIDER_SITE_OTHER): Payer: Medicare PPO

## 2023-04-26 DIAGNOSIS — R8290 Unspecified abnormal findings in urine: Secondary | ICD-10-CM | POA: Diagnosis not present

## 2023-04-26 LAB — URINALYSIS, ROUTINE W REFLEX MICROSCOPIC
Bilirubin Urine: NEGATIVE
Ketones, ur: NEGATIVE
Nitrite: NEGATIVE
Specific Gravity, Urine: 1.015 (ref 1.000–1.030)
Urine Glucose: NEGATIVE
Urobilinogen, UA: 0.2 (ref 0.0–1.0)
pH: 7 (ref 5.0–8.0)

## 2023-04-26 NOTE — Telephone Encounter (Signed)
Pharmacy Patient Advocate Encounter   Received notification from Pt Calls Messages that prior authorization for Forteo is required/requested.   Insurance verification completed.   The patient is insured through [facility] [employer] .   Per test claim:  Alendronate is preferred by the insurance.  If suggested medication is appropriate, Please send in a new RX and discontinue this one. If not, please advise as to why it's not appropriate so that we may request a Prior Authorization.

## 2023-04-26 NOTE — Telephone Encounter (Signed)
Carmen Lund lpn live in nurse is calling and does not want to bring pt in and would like order for ua/culture urine sensitivity . Pt was passing blood clot in urine yesterday. Pt has catheter and janet will get a sample to bring in for test if ok please put I order

## 2023-04-26 NOTE — Telephone Encounter (Signed)
Relay message to Dr. [name].   Dr. [name] gave a verbal order to okay to ordering lab. Advise pt to follow up with urology if haven't.    Contacted pt and spoke to [name], pt's nurse on DPR. Inform of above message. She reports pt was last seen with urology this past August and states they will follow up with pt's urology.   [name] reports pt is having cloudy urine and sediment possibly from kidney stone. She states doesn't know if pt has an urgency of urinating, or urination frequently due to pt has catheter.   Lab order is placed. Advise to bring in urine sample. Lap appt is scheduled.

## 2023-04-27 NOTE — Progress Notes (Signed)
Blood and inflammation cells  in urine like a uti   but not  a lot of bacteria  . Awaiting the  culture report . Or  if urology advises specific  treatment

## 2023-04-28 LAB — URINE CULTURE
MICRO NUMBER:: 15718716
SPECIMEN QUALITY:: ADEQUATE

## 2023-04-29 ENCOUNTER — Ambulatory Visit: Payer: Medicare PPO | Admitting: Occupational Therapy

## 2023-04-29 ENCOUNTER — Encounter: Payer: Self-pay | Admitting: Physical Therapy

## 2023-04-29 ENCOUNTER — Ambulatory Visit: Payer: Medicare PPO | Admitting: Physical Therapy

## 2023-04-29 DIAGNOSIS — R29898 Other symptoms and signs involving the musculoskeletal system: Secondary | ICD-10-CM | POA: Diagnosis not present

## 2023-04-29 DIAGNOSIS — R293 Abnormal posture: Secondary | ICD-10-CM

## 2023-04-29 DIAGNOSIS — R29818 Other symptoms and signs involving the nervous system: Secondary | ICD-10-CM | POA: Diagnosis not present

## 2023-04-29 DIAGNOSIS — M24542 Contracture, left hand: Secondary | ICD-10-CM

## 2023-04-29 DIAGNOSIS — R278 Other lack of coordination: Secondary | ICD-10-CM

## 2023-04-29 DIAGNOSIS — M6281 Muscle weakness (generalized): Secondary | ICD-10-CM

## 2023-04-29 DIAGNOSIS — R2689 Other abnormalities of gait and mobility: Secondary | ICD-10-CM | POA: Diagnosis not present

## 2023-04-29 DIAGNOSIS — M24541 Contracture, right hand: Secondary | ICD-10-CM

## 2023-04-29 DIAGNOSIS — R208 Other disturbances of skin sensation: Secondary | ICD-10-CM

## 2023-04-29 DIAGNOSIS — G8254 Quadriplegia, C5-C7 incomplete: Secondary | ICD-10-CM

## 2023-04-29 NOTE — Therapy (Signed)
OUTPATIENT PHYSICAL THERAPY NEURO TREATMENT   Patient Name: BREONICA GAUNA MRN: [id number] DOB:07/28/1950, 72 y.o., female Today's Date: 04/29/2023   PCP: [name], MD REFERRING PROVIDER: Maryjane Hurter, MD  END OF SESSION:   PT End of Session - 04/29/23 1330     Visit Number 7    Number of Visits 17   16 + eval   Date for PT Re-Evaluation 06/10/23   to allow for scheduling delays   Authorization Type [employer]ICARE    Progress Note Due on Visit 10    PT Start Time 1016    PT Stop Time 1104    PT Time Calculation (min) 48 min    Equipment Utilized During Treatment Gait belt    Activity Tolerance Patient tolerated treatment well    Behavior During Therapy WFL for tasks assessed/performed                Past Medical History:  Diagnosis Date   CERVICAL POLYP 03/11/2008   Qualifier: Diagnosis of  By: [name] MD, [name]    Colon polyps 2005   on colonscopy [name]   Fibroid 2004   Per Dr. [name]   History of shingles    face and mouth   Hx of skin cancer, basal cell    Rosacea    Sciatica of left side 09/28/2013   Scoliosis    noted on mri done for back pain   Past Surgical History:  Procedure Laterality Date   BUNIONECTOMY     Patient Active Problem List   Diagnosis Date Noted   Buttock wound, left, subsequent encounter 03/16/2023   Bronchiectasis with acute exacerbation (HCC) 03/15/2023   Buttock wound, left, initial encounter 11/02/2022   Orthostatic hypotension 08/13/2022   Neurogenic bowel 05/03/2022   Spasticity 05/03/2022   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/03/2022   Nerve pain 05/03/2022   Medication monitoring encounter 01/08/2022   Neurogenic bladder 10/11/2021   Urinary incontinence 10/11/2021   ESBL (extended spectrum beta-lactamase) producing bacteria infection 10/09/2021   Recurrent UTI 10/09/2021   Quadriplegia, C5-C7 incomplete (HCC) 01/16/2021   History of spinal fracture 01/16/2021   Suprapubic catheter (HCC) 01/16/2021    Encounter for routine gynecological examination 09/28/2013   Onychomycosis 09/28/2013   Foot deformity, acquired 03/26/2012   Encounter for preventive health examination 12/25/2010   ROSACEA 08/25/2009   Disturbance in sleep behavior 03/11/2008   SKIN CANCER, HX OF 03/11/2008   DYSURIA, HX OF 03/11/2008   Hyperlipidemia 02/10/2007   CERVICALGIA 02/10/2007    ONSET DATE: 03/11/2023 (most recent referral)  REFERRING DIAG: G82.54 (ICD-10-CM) - Quadriplegia, C5-C7 incomplete  THERAPY DIAG:  Other lack of coordination  Muscle weakness (generalized)  Other symptoms and signs involving the nervous system  Abnormal posture  Rationale for Evaluation and Treatment: Rehabilitation  SUBJECTIVE:  SUBJECTIVE STATEMENT: Pt received seated in [facility] w/ caregiver present.  They deny recent falls.  She is feeling fine today.  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 No - same as in her forearms at baseline  PRECAUTIONS: Fall; suprapubic catheter (she wants to get this removed meaning she needs to get to and from the toilet); she has had minor heat sensation when needing to complete her bowel program-possible AD?; pt has recently returned to wearing binder since pneumonia due to breathing and core stability.  Pt/caregiver performs bladder clamping prior to transfers as pt is still doing bladder training.  WEIGHT BEARING RESTRICTIONS: No-Follow fall precautions, adequate dairy in diet and exercises (aerobic, balancing and weight bearing) as tolerated. - most recent endocrinologist note.  FALLS: Has patient fallen in last 6 months? No  LIVING ENVIRONMENT: Lives with: lives with  their spouse and live-in nurse [name] Lives in: House/apartment-townhouse Stairs: No-level entry, but multi-level home 4 stories w/ elevator Has following equipment at home: Wheelchair (power), Wheelchair (manual), Grab bars, and standing frame, sliding board, transport shower chair, handheld shower head, leg strap-pt reports she no longer finds this helpful  PLOF: Requires assistive device for independence, Needs assistance with ADLs, Needs assistance with homemaking, and Needs assistance with transfers  OCCUPATION:  Writer-uses Dragon to dictate  PATIENT GOALS: "Make my right leg work."  Stand and pivot so she can more easily access a toilet.  OBJECTIVE:   DIAGNOSTIC FINDINGS:  ASSESSMENT: The BMD measured at Forearm Radius 33% is 0.506 g/cm2 with a T-score of -4.2. This patient is considered osteoporotic according to World Health Organization [facility]) criteria.  COGNITION: Overall cognitive status: Within functional limits for tasks assessed   SENSATION: Light touch: Diminished ability to distinguish sharp and dull, but able to distinguish light touch from injury level down accurately  COORDINATION: Not formally assessed.  EDEMA:  Well managed w/ lymphatic massage and compression stockings.  MUSCLE TONE: Pt has intermittent clonus during transfers.  POSTURE: rounded shoulders, posterior pelvic tilt, and right thoracic scoliosis  LOWER EXTREMITY ROM:     Passive  Right Eval Left Eval  Hip flexion See below.  Hip extension   Hip abduction   Hip adduction   Hip internal rotation   Hip external rotation   Knee flexion   Knee extension   Ankle dorsiflexion   Ankle plantarflexion   Ankle inversion   Ankle eversion    (Blank rows = not tested)  LOWER EXTREMITY MMT:    MMT Right Eval Left Eval  Hip flexion Left knee extension almost to neutral.  Left DF just passed neutral.  Hip extension   Hip abduction   Hip adduction   Hip internal rotation   Hip external  rotation   Knee flexion   Knee extension   Ankle dorsiflexion   Ankle plantarflexion   Ankle inversion   Ankle eversion   (Blank rows = not tested)  BED MOBILITY:  Sit to supine Mod A Supine to sit Mod A Rolling to Right Mod A Rolling to Left Mod A Undulating mattress for wound management on standard bed (elevated-so often doing uphill sliding board transfers); she would like to continue working on sitting up independently, she has been working on rolling, needs less assistance w/ this when someone props her leg into hooklying; would like something to help her pull her  left leg to her butt for stretching as well as bed mobility.  TRANSFERS: Pt continues using combination of bump over, slide board, and depression (squat pivot) transfers.   FUNCTIONAL TESTS:  None relevant to pt's current functional level and abilities.  PATIENT SURVEYS:  None completed due to time.  TODAY'S TREATMENT:                                                                                                                               Pt performs scapular depression transfer to left x3 w/ PT min-modA supporting upper thighs per pt preference.   -PT provides modA to swing LE into supine from EOM w/ pt laying backwards and PT positioning LE/hips and then assisting in upper trunk adjustment for alignment -In supine: x10 reps of should protraction and core flexion to attempt clearing scapula from mat (~1/3-1/2 of shoulder blade cleared) > left shoulder to right hip x10 > right shoulder to left hip x10 > using 4" step on each side of pt to elevate blaze pod to field of view and tolerated rotation point pt performed 2 rounds of contralateral raised reach and taps resulting in 30 and 36 taps alternating trunk rotation > bean bag toss to end of mat using scapular lift technique for core engagement, pt did well with rolled bean bags for improved dexterity -ModA to obtain side-lying elbow prop using top UE to toss bean bags  and encouraged trunk momentum to get increased rotation ROM to tolerance each side -In supine: PT provides passive ROM to BLE -Returned to sitting EOM using mod-maxA to pull-to-sit -Left stand pivot maxA due to pt fatigue to PWC at end of session using hands on therapist shoulder w/ posterior pelvic support, caregiver assist and chair feature use to position after transfer  PATIENT EDUCATION: Education details: Goals of session today and deficits noted. Person educated: Patient and Arts administrator Education method: Explanation Education comprehension: verbalized understanding  HOME EXERCISE PROGRAM: Will be established as needed as pt has done continuous therapy and is working towards functional tasks.  GOALS: Goals reviewed with patient? Yes  SHORT TERM GOALS: Target date: 05/06/2023  HEP to be established for stretching and strengthening as needed. Baseline:  To be established. Goal status: INITIAL  2.  Pt will be able to perform rolling L and R w/ no more than minA in order to improve functional mobility. Baseline: modA Goal status: INITIAL  3.  Pt will perform sit<>supine requiring no more than minA in order to improve functional mobility. Baseline: modA Goal status: INITIAL  4.  Pt will perform bilateral reach outside seated BOS with feet supported without LOB in order to improve safety with ADL performance. Baseline: lateral LOB bilaterally demonstrated in w/c Goal status: INITIAL  LONG TERM GOALS: Target date: 06/03/2023  Pt will perform squat pivot transfer w/ no more than modA in order to improve access to home environment for toilet transfers. Baseline: pt reports  recent practice with depression transfers Goal status: INITIAL  2.  Pt will perform sit<>supine requiring no more than supervision in order to improve transfers in and out of bed. Baseline:  modA Goal status: INITIAL  3.  Aquatic HEP to be established with caregiver assistance as  appropriate. Baseline:  To be established. Goal status: INITIAL  4.  Pt will be able to perform rolling L and R w/ no more than supervision in order to improve functional mobility. Baseline: modA Goal status: INITIAL  5.  Pt will perform stand pivot transfer w/ no more than modA in order to improve toilet transfer for ongoing bladder training. Baseline:  To be assessed. Goal status: INITIAL  6.  Pt will stand from no more than 20 inch seat height with no more than modA to initiate transfer with maintained stand for >/=5 minutes in order to improve trunk and LE strength and stability for functional mobility and transfers. Baseline: To be assessed. Goal status: INITIAL  ASSESSMENT:  CLINICAL IMPRESSION: Focus of skilled session on core engagement and trunk rotation active mobility.  She was challenged even in limited range of motion today though she tolerates all well.  She was fatigued at end of session affecting transfer ability with stand pivot requiring increased assistance today.  Will continue to work on core stability training and other outlined deficits per POC.  OBJECTIVE IMPAIRMENTS: decreased balance, decreased coordination, decreased mobility, difficulty walking, decreased ROM, decreased strength, hypomobility, increased edema, impaired flexibility, impaired sensation, impaired tone, impaired UE functional use, improper body mechanics, postural dysfunction, and pain.   ACTIVITY LIMITATIONS: carrying, lifting, bending, sitting, standing, squatting, stairs, transfers, bed mobility, continence, bathing, toileting, dressing, reach over head, hygiene/grooming, locomotion level, and caring for others  PARTICIPATION LIMITATIONS: meal prep, cleaning, laundry, driving, and community activity  PERSONAL FACTORS: Age, Fitness, Past/current experiences, Time since onset of injury/illness/exacerbation, and 1-2 comorbidities: intermittent AD, neurogenic bowel/bladder on active bladder training,  osteoporosis  are also affecting patient's functional outcome.   REHAB POTENTIAL: Good  CLINICAL DECISION MAKING: Evolving/moderate complexity  EVALUATION COMPLEXITY: Moderate  PLAN:  PT FREQUENCY: 2x/week (1x/wk land and 1x/wk in pool)  PT DURATION: 8 weeks  PLANNED INTERVENTIONS: 16109- PT Re-evaluation, 97110-Therapeutic exercises, 97530- Therapeutic activity, 97112- Neuromuscular re-education, 97535- Self Care, 60454- Manual therapy, [phone]- Gait training, [phone]- Orthotic Fit/training, [phone]- Aquatic Therapy, 97014- Electrical stimulation (unattended), [phone]- Electrical stimulation (manual), Patient/Family education, Balance training, Dry Needling, DME instructions, Wheelchair mobility training, Moist heat, Therapeutic exercises, Therapeutic activity, Neuromuscular re-education, Gait training, and Self Care  PLAN FOR NEXT SESSION:  Multilevel slide board transfers vs stand pivots.  Core strength, seated reaching/trunk righting.  Bed mobility.  Use stander or stedy to practice weight-bearing and core as tolerated.  Practice stands from progressively lowered surface. Standing with LRAD.  Supine/side-lying lateral trunk engagement/strengthening.  Pt will switch to leg bag for aquatics as she used at [place]. - She has been cleared by Shanda Bumps for use of leg bag in pool and informed of need to clean well after pool due to possible irritation.  [name] (caregiver) is willing to get in pool to assist for more involved activities like tall kneel, etc. Pt may be going to [place] for prolonged period in November (probably not until Thanksgiving)  Khadeja Abt B Amauria Younts, PT, DPT  04/29/2023, 1:58 PM

## 2023-04-29 NOTE — Therapy (Signed)
OUTPATIENT OCCUPATIONAL THERAPY NEURO TREATMENT  Patient Name: ADARA HAUTH MRN: [id number] DOB:16-Dec-1950, 72 y.o., female Today's Date: 04/29/2023  PCP: [name], MD  REFERRING PROVIDER: Maryjane Hurter, MD   END OF SESSION:  OT End of Session - 04/29/23 1252     Visit Number 6    Number of Visits 17    Date for OT Re-Evaluation 06/10/23    Authorization Type [employer] - requires auth, [place]    Progress Note Due on Visit 10    OT Start Time 1106    OT Stop Time 1146    OT Time Calculation (min) 40 min    Equipment Utilized During Treatment personal estim unit    Activity Tolerance Patient tolerated treatment well    Behavior During Therapy [facility] for tasks assessed/performed                Past Medical History:  Diagnosis Date   CERVICAL POLYP 03/11/2008   Qualifier: Diagnosis of  By: [name] MD, [name]    Colon polyps 2005   on colonscopy [name]   Fibroid 2004   Per Dr. [name]   History of shingles    face and mouth   Hx of skin cancer, basal cell    Rosacea    Sciatica of left side 09/28/2013   Scoliosis    noted on mri done for back pain   Past Surgical History:  Procedure Laterality Date   BUNIONECTOMY     Patient Active Problem List   Diagnosis Date Noted   Buttock wound, left, subsequent encounter 03/16/2023   Bronchiectasis with acute exacerbation (HCC) 03/15/2023   Buttock wound, left, initial encounter 11/02/2022   Orthostatic hypotension 08/13/2022   Neurogenic bowel 05/03/2022   Spasticity 05/03/2022   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/03/2022   Nerve pain 05/03/2022   Medication monitoring encounter 01/08/2022   Neurogenic bladder 10/11/2021   Urinary incontinence 10/11/2021   ESBL (extended spectrum beta-lactamase) producing bacteria infection 10/09/2021   Recurrent UTI 10/09/2021   Quadriplegia, C5-C7 incomplete (HCC) 01/16/2021   History of spinal fracture 01/16/2021   Suprapubic catheter (HCC) 01/16/2021   Encounter for  routine gynecological examination 09/28/2013   Onychomycosis 09/28/2013   Foot deformity, acquired 03/26/2012   Encounter for preventive health examination 12/25/2010   ROSACEA 08/25/2009   Disturbance in sleep behavior 03/11/2008   SKIN CANCER, HX OF 03/11/2008   DYSURIA, HX OF 03/11/2008   Hyperlipidemia 02/10/2007   CERVICALGIA 02/10/2007    ONSET DATE: 07/28/2020  Date of Referral 03/23/2023  REFERRING DIAG: G82.54 (ICD-10-CM) - Quadriplegia, C5-C7, incomplete (HCC)  THERAPY DIAG:  Other lack of coordination  Muscle weakness (generalized)  Other symptoms and signs involving the nervous system  Contracture of hand joint, right  Contracture of hand joint, left  Other symptoms and signs involving the musculoskeletal system  Other disturbances of skin sensation  Quadriplegia, C5-C7 incomplete (HCC)  Rationale for Evaluation and Treatment: Rehabilitation  SUBJECTIVE:   SUBJECTIVE STATEMENT: Pt reports rushing today and therefore forgetting to bring personal laptop. Pt reports things are going well and PT session went well. Pt brought personal e-stim today.  Pt accompanied by:  Live in Caregiver - [name]   PERTINENT HISTORY: "Pt is a 72 yr old L handed female with hx of incomplete quadriplegia- 2/14 2022- fleeing the police in [place] on passenger 100 (high speed) miles/hour,  [name]; neurogenic bowel and bladder and spasticity; [name], [name] Here for f/u on  Incomplete quadriplegia"  PRECAUTIONS: Fall; suprapubic catheter (she wants to get this removed meaning she needs to get to and from the toilet); she has had minor heat sensation when needing to complete her bowel program-possible AD?   WEIGHT BEARING RESTRICTIONS: No  PAIN: - reports average pain as noted below. Will notify therapist if there are changes in her pain.  Are you having pain? Yes: NPRS scale: 3/10 Pain location: fingers to elbow bilaterally Pain description:  constant Aggravating factors: it can increased over time ie) is worse at the end of the day.  Also cold affects cramps and function. Relieving factors: gabapentin and baclofen for spasms, nightly stretching  FALLS: Has patient fallen in last 6 months? No  LIVING ENVIRONMENT: Lives with: lives with their family - husband [name] and with an adult companion s/p moving back up from [place] x10 months Lives in: House/apartment Stairs:  4 story town house with an Engineer, structural with threshold adjustments, roll in shower with transport chair Has following equipment at home: Wheelchair (power) - with seat height adjustments to access counters and reclining option, Wheelchair (manual), transport WC, shower chair, and Ramped entry, handheld showerhead with rails around toilet, had Michiel Sites but is no longer in need of it, has slide boards x3  PLOF: Requires assistive device for independence, Needs assistance with ADLs, Needs assistance with homemaking, Needs assistance with gait, and Needs assistance with transfers; 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  Used to like to knit, sew and bake.  PATIENT GOALS: improve L shoulder pain, ROM, and strength; core strength  OBJECTIVE:   HAND DOMINANCE: Left  ADLs: Overall ADLs: Patient has a live in caregiver  Transfers/ambulation related to ADLs: min assist with sliding board transfers.  Eating: Has a rocker knife that she can use. Used to use adapted utensils but now uses regular utensils but still will get assistance to cut food ie) when eating out.  Grooming: can brush her own hair with LUE only; unable to manage jewelry ie) earrings  UB Dressing: can zip/unzip after it has been started, unable to manage buttons herself, Caregiver assists but if she has extra time, she can put on her bra, and a loose fitting pullover shirt/t-shirt  LB Dressing: dependent for LB dressing in bed and with special sock donner for LE compression garments    Toileting: bladder trained with suprapubic catheter which she clamps off.  Dependent for bowel incontinence care.  Bathing: Sponge bath with adult washclothes.  Can bathe UB with back scrubber for most of her back.  Needs help with feet (mentioned she might need a separate brush for feet)   Tub Shower transfers: Min assist with slide board  Equipment: Shower seat with back, Walk in shower, bed side commode, Reacher, Sock aid, Long handled sponge, and Feeding equipment  IADLs: --  Shopping: Assisted by caregiver  Light housekeeping: Has housekeeper that comes monthly  Meal Prep: previously enjoyed baking. Assisted by caregiver but has reheated a meal for herself after getting food out of the fridge/freezer from her WC.  Community mobility: Dependent  Medication management: Caregiver sorts them into pillbox but she is very aware of her medications   Financial management: Patient manages her own finances  Handwriting: Increased time and has a pen with a little grip  MOBILITY STATUS: Independent with power mobility  ACTIVITY TOLERANCE: Activity tolerance: good to Fair - MMT WFL but has limited sustained tolerance for ongoing use of Ues with poor trunk control  FUNCTIONAL  OUTCOME MEASURES:   Total score = sum of the activity scores/number of activities Minimum detectable change (90%CI) for average score = 2 points Minimum detectable change (90%CI) for single activity score = 3 points   UPPER EXTREMITY ROM:   AROM - WFL without obvious contractures, some digital flexion noted but PROM WNL   UPPER EXTREMITY MMT:   Grossly WFL - Endurance limited R tricep strength > than L but L UE generally stronger than R UE  MMT Right (eval) Left (eval)  Shoulder flexion 4/5 4/5  Shoulder abduction 4/5 4/5  Elbow flexion 4/5 4/5  Elbow extension 4/5 4-/5  (Blank rows = not tested)  HAND FUNCTION: Grip strength: Right: 5.2 lbs; Left: 15.6 lbs  COORDINATION: 9 Hole Peg test: Right:  able to place all 9 and remove 4 pegs in 3 minutes sec; Left: 84 sec R hand finger eventually cramps and dexterity worsens in the cold  SENSATION: Light touch: Impaired  - patient   EDEMA: NA for UEs but LE has poor lymph drainage with custom compression garments   MUSCLE TONE: Generally WFL   COGNITION: Overall cognitive status: Within functional limits for tasks assessed  VISION: Subjective report: Patent wears progressive lens/glasses.  Denies diplopia or vision changes. Baseline vision: Wears glasses all the time  VISION ASSESSMENT: WFL  OBSERVATIONS: Patient independent with power WC navigation within clinic.  Patient is well-kept with foley catheter in place.  She has slight limitations in full extension of digits but PROM is WNL.    TODAY'S TREATMENT:                                                                                        Therapeutic Activities  Pt reports requiring setup assistance for placing e-stim electrodes for though pt ind activates electrodes and adjusts settings. Today, pt set e-stim unit to 30 minutes, pt tolerated max of 33. Pt reported she is unable to apply electrodes for setup and will continue to require assistance d/t safety concerns and to ensure correct placement of electrodes. ARCEX goal assessed and updated, see below.  Typing with active personal e-stim unit - to improve BUE FM coordination and dexterity for functional typing tasks. 3-minute typing task - Trial 1: 17 wpm with 96% accuracy, Trial 2: 19 wpm with 98% accuracy.  Pt demo'd hunt-and-peck method of typing and reported some difficulty d/t accidentally hitting space bar. Pt trialed using external mouse. Pt reported and demo'd difficulty with mouse d/t high sensitivity of mouse. Pt reported preference for using trackpad to move cursor on personal laptop. OT educated pt on and showed some examples of ergonomic keyboard options online with space bar separated. Pt verbalized understanding.     After electrodes were removed, pt had red spot at back of neck where an electrode was placed. Pt also c/o itching at site of electrode during typing tasks. OT recommended to pt to decrease electrode power settings during tasks to preserve skin integrity and prevent redness. Pt and caregiver verbalized understanding.   Reviewed splint schedule - Pt reported wearing splints at night for BUE to prevent flexor contractures. OT recommended to continue wearing splints at  night and to participate in AROM digit ext during the day to prevent contractures. Pt verbalized understanding.  TherEx: OT initiated Shoulder AROM - to improve BUE strengthening, BUE AROM,  upright seated posture. Pt benefited from v/c for seated posture and avoiding posterior tilt of pelvis.  Exercises - Seated Shoulder Shrug  - 3 x daily - 10 reps - 2 hold - Seated Scapular Retraction  - 3 x daily - 10 reps - Seated Shoulder Rolls  - 3 x daily - 10 reps  PATIENT EDUCATION: Education details: see today's treatment above Person educated: Patient and Caregiver Live in caregiver - [name] Education method: Explanation, Demonstration, Verbal cues, and Handouts Education comprehension: verbalized understanding, returned demonstration, and needs further education  HOME EXERCISE PROGRAM: 04/25/23 - Weight-shifting (handout provided, see pt instructions) 04/29/23 - Shoulder AROM (Medbridge, Access Code: [email or web address]), online typing tasks and ergonomic keyboard name (handout)   GOALS:   SHORT TERM GOALS: Target date: 05/06/2023    Patient will be able to use AE/modified techniques to cut soft foods small enough for oral intake. Baseline: Caregiver/spouse assist Goal status: INITIAL  2.  Pt will be independent with BUE braces/splints as needed to prevent contracture and improve functional use of hands.  Baseline: Caregiver/spouse assist; pt lacks carryover from [facility] Goal status: IN progress  3.  Pt will be independent  with use of ARCEX device as needed for carryover and improvement of BUE function.  Baseline: Caregiver/spouse assist; unable to independently recall protocol for use Goal status: 04/29/23 - MET. Pt reports as ind as possible with device. Pt requires setup assistance to place electrodes from caregiver/spouse though pt adjusts settings of e-stim unit ind.   LONG TERM GOALS: Target date: 06/10/23   Patient will demonstrate independence with updated HEP for UE strengthening, coordination and ROM to prevent contractures and maintain strength for transfers and ADLs. Baseline: Previous HEPs have been established but need to be reviewed and updated.  Goal status: INITIAL  2.  Patient will report at least two-point increase in average PSFS score or at least three-point increase in a single activity score indicating functionally significant improvement given minimum detectable change.  Baseline: 0.5 total score (See above for individual activity scores)   3.  Pt will demonstrate ability to place 5lb item bimanually on table at least 1' in front of her without using support of elbows, without leaning against back of chair, and with good control.  Baseline: unable Goal status: INITIAL  4.  Pt will report increased ability to push up through LUE with completion of transfers. Baseline: unable Goal status: INITIAL   ASSESSMENT:  CLINICAL IMPRESSION: Pt tolerated typing tasks well though may benefit from alternate keyboard options to prevent accidentally hitting other keys during typing tasks. Pt benefited from v/c for posture during shoulder AROM. Recommend continued core and fine motor coordination work with modified ArcEx. Pt would benefit from skilled OT services to address deficits and increase overall independence.    PERFORMANCE DEFICITS: in functional skills including ADLs, IADLs, coordination, dexterity, strength, muscle spasms, Fine motor control, Gross motor control, continence, skin  integrity, and UE functional use,   IMPAIRMENTS: are limiting patient from ADLs, IADLs, work, and leisure.   CO-MORBIDITIES: has co-morbidities such as incontinence and wound  that affects occupational performance. Patient will benefit from skilled OT to address above impairments and improve overall function.  REHAB POTENTIAL: Fair due to chronicity of injury  PLAN:  OT FREQUENCY: 2x/week  OT DURATION: 8 weeks  PLANNED  INTERVENTIONS: self care/ADL training, therapeutic exercise, therapeutic activity, neuromuscular re-education, manual therapy, passive range of motion, balance training, functional mobility training, splinting, patient/family education, energy conservation, coping strategies training, and DME and/or AE instructions  RECOMMENDED OTHER SERVICES: Patient was seen for PT evaluation today with treatment plans coordinated for 2x/week.  CONSULTED AND AGREED WITH PLAN OF CARE: Patient and family member/caregiver  PLAN FOR NEXT SESSION:   Initiate additional AROM HEP - shoulder, scapular ROM Assess progress towards STG Explore FM tasks  Typing tasks at computer - if pt brings personal laptop Will review splints/splint schedule with patient PRN Review EazyHold (silicone strap) for hair brush PRN  Wynetta Emery, OT 04/29/2023, 1:05 PM

## 2023-05-02 ENCOUNTER — Telehealth: Payer: Self-pay

## 2023-05-02 ENCOUNTER — Other Ambulatory Visit (HOSPITAL_COMMUNITY): Payer: Self-pay

## 2023-05-02 NOTE — Telephone Encounter (Signed)
Pharmacy Patient Advocate Encounter   Received notification from CoverMyMeds that prior authorization for Forteo is required/requested.   Insurance verification completed.   The patient is insured through [employer] .   Per test claim: PA required; PA submitted to above mentioned insurance via CoverMyMeds Key/confirmation #/EOC [id number] Status is pending

## 2023-05-02 NOTE — Progress Notes (Signed)
Culture shows enterococcus  s to amoxicillin . If not already on antibiotic please order   Send in amoxicillin 500 mg 1 po tid for 5 days disp 15

## 2023-05-03 ENCOUNTER — Other Ambulatory Visit: Payer: Self-pay | Admitting: Internal Medicine

## 2023-05-03 ENCOUNTER — Other Ambulatory Visit (HOSPITAL_COMMUNITY): Payer: Self-pay

## 2023-05-03 ENCOUNTER — Encounter: Payer: Self-pay | Admitting: Rehabilitation

## 2023-05-03 ENCOUNTER — Ambulatory Visit: Payer: Medicare PPO | Admitting: Rehabilitation

## 2023-05-03 ENCOUNTER — Other Ambulatory Visit: Payer: Self-pay

## 2023-05-03 ENCOUNTER — Encounter: Payer: Self-pay | Admitting: Internal Medicine

## 2023-05-03 DIAGNOSIS — R29818 Other symptoms and signs involving the nervous system: Secondary | ICD-10-CM

## 2023-05-03 DIAGNOSIS — R2689 Other abnormalities of gait and mobility: Secondary | ICD-10-CM | POA: Diagnosis not present

## 2023-05-03 DIAGNOSIS — G8254 Quadriplegia, C5-C7 incomplete: Secondary | ICD-10-CM | POA: Diagnosis not present

## 2023-05-03 DIAGNOSIS — R278 Other lack of coordination: Secondary | ICD-10-CM | POA: Diagnosis not present

## 2023-05-03 DIAGNOSIS — M6281 Muscle weakness (generalized): Secondary | ICD-10-CM | POA: Diagnosis not present

## 2023-05-03 DIAGNOSIS — R208 Other disturbances of skin sensation: Secondary | ICD-10-CM | POA: Diagnosis not present

## 2023-05-03 DIAGNOSIS — M24541 Contracture, right hand: Secondary | ICD-10-CM | POA: Diagnosis not present

## 2023-05-03 DIAGNOSIS — R29898 Other symptoms and signs involving the musculoskeletal system: Secondary | ICD-10-CM | POA: Diagnosis not present

## 2023-05-03 DIAGNOSIS — R293 Abnormal posture: Secondary | ICD-10-CM | POA: Diagnosis not present

## 2023-05-03 MED ORDER — AMOXICILLIN 500 MG PO CAPS: 500.0000 mg | ORAL_CAPSULE | Freq: Three times a day (TID) | ORAL | 0 refills | Status: DC

## 2023-05-03 NOTE — Therapy (Signed)
OUTPATIENT PHYSICAL THERAPY NEURO TREATMENT   Patient Name: [name] MRN: [id number] DOB:12-28-1950, 72 y.o., female Today's Date: 05/03/2023   PCP: [name], MD REFERRING PROVIDER: Maryjane Hurter, MD  END OF SESSION:   PT End of Session - 05/03/23 0815     Visit Number 8    Number of Visits 17   16 + eval   Date for PT Re-Evaluation 06/10/23   to allow for scheduling delays   Authorization Type [employer] MEDICARE    Progress Note Due on Visit 10    PT Start Time 1145    PT Stop Time 1235    PT Time Calculation (min) 50 min    Equipment Utilized During Treatment Gait belt;Other (comment)   floatation devices as needed for safety   Activity Tolerance Patient tolerated treatment well    Behavior During Therapy [facility] for tasks assessed/performed                Past Medical History:  Diagnosis Date   CERVICAL POLYP 03/11/2008   Qualifier: Diagnosis of  By: [name] MD, [name]    Colon polyps 2005   on colonscopy [name]   Fibroid 2004   Per Dr. [name]   History of shingles    face and mouth   Hx of skin cancer, basal cell    Rosacea    Sciatica of left side 09/28/2013   Scoliosis    noted on mri done for back pain   Past Surgical History:  Procedure Laterality Date   BUNIONECTOMY     Patient Active Problem List   Diagnosis Date Noted   Buttock wound, left, subsequent encounter 03/16/2023   Bronchiectasis with acute exacerbation (HCC) 03/15/2023   Buttock wound, left, initial encounter 11/02/2022   Orthostatic hypotension 08/13/2022   Neurogenic bowel 05/03/2022   Spasticity 05/03/2022   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/03/2022   Nerve pain 05/03/2022   Medication monitoring encounter 01/08/2022   Neurogenic bladder 10/11/2021   Urinary incontinence 10/11/2021   ESBL (extended spectrum beta-lactamase) producing bacteria infection 10/09/2021   Recurrent UTI 10/09/2021   Quadriplegia, C5-C7 incomplete (HCC) 01/16/2021   History of spinal fracture  01/16/2021   Suprapubic catheter (HCC) 01/16/2021   Encounter for routine gynecological examination 09/28/2013   Onychomycosis 09/28/2013   Foot deformity, acquired 03/26/2012   Encounter for preventive health examination 12/25/2010   ROSACEA 08/25/2009   Disturbance in sleep behavior 03/11/2008   SKIN CANCER, HX OF 03/11/2008   DYSURIA, HX OF 03/11/2008   Hyperlipidemia 02/10/2007   CERVICALGIA 02/10/2007    ONSET DATE: 03/11/2023 (most recent referral)  REFERRING DIAG: G82.54 (ICD-10-CM) - Quadriplegia, C5-C7 incomplete  THERAPY DIAG:  Muscle weakness (generalized)  Other symptoms and signs involving the nervous system  Abnormal posture  Other abnormalities of gait and mobility  Rationale for Evaluation and Treatment: Rehabilitation  SUBJECTIVE:  SUBJECTIVE STATEMENT: Pt received seated in [facility] w/ caregiver present and ready for pool.  Did not bring shoes today, but plans to next visit.  Also reports she does not want to get hair wet today as she has a professional call later today.    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 No  PRECAUTIONS: Fall; suprapubic catheter (she wants to get this removed meaning she needs to get to and from the toilet); she has had minor heat sensation when needing to complete her bowel program-possible AD?; pt has recently returned to wearing binder since pneumonia due to breathing and core stability.  Pt/caregiver performs bladder clamping prior to transfers as pt is still doing bladder training.  WEIGHT BEARING RESTRICTIONS: No-Follow fall precautions, adequate dairy in diet and exercises (aerobic, balancing and weight bearing) as  tolerated. - most recent endocrinologist note.  FALLS: Has patient fallen in last 6 months? No  LIVING ENVIRONMENT: Lives with: lives with their spouse and live-in nurse [name] Lives in: House/apartment-townhouse Stairs: No-level entry, but multi-level home 4 stories w/ elevator Has following equipment at home: Wheelchair (power), Wheelchair (manual), Grab bars, and standing frame, sliding board, transport shower chair, handheld shower head, leg strap-pt reports she no longer finds this helpful  PLOF: Requires assistive device for independence, Needs assistance with ADLs, Needs assistance with homemaking, and Needs assistance with transfers  OCCUPATION:  Writer-uses Dragon to dictate  PATIENT GOALS: "Make my right leg work."  Stand and pivot so she can more easily access a toilet.  OBJECTIVE:   DIAGNOSTIC FINDINGS:  ASSESSMENT: The BMD measured at Forearm Radius 33% is 0.506 g/cm2 with a T-score of -4.2. This patient is considered osteoporotic according to World Health Organization [facility]) criteria.  COGNITION: Overall cognitive status: Within functional limits for tasks assessed   SENSATION: Light touch: Diminished ability to distinguish sharp and dull, but able to distinguish light touch from injury level down accurately  COORDINATION: Not formally assessed.  EDEMA:  Well managed w/ lymphatic massage and compression stockings.  MUSCLE TONE: Pt has intermittent clonus during transfers.  POSTURE: rounded shoulders, posterior pelvic tilt, and right thoracic scoliosis  LOWER EXTREMITY ROM:     Passive  Right Eval Left Eval  Hip flexion See below.  Hip extension   Hip abduction   Hip adduction   Hip internal rotation   Hip external rotation   Knee flexion   Knee extension   Ankle dorsiflexion   Ankle plantarflexion   Ankle inversion   Ankle eversion    (Blank rows = not tested)  LOWER EXTREMITY MMT:    MMT Right Eval Left Eval  Hip flexion Left knee extension  almost to neutral.  Left DF just passed neutral.  Hip extension   Hip abduction   Hip adduction   Hip internal rotation   Hip external rotation   Knee flexion   Knee extension   Ankle dorsiflexion   Ankle plantarflexion   Ankle inversion   Ankle eversion   (Blank rows = not tested)  BED MOBILITY:  Sit to supine Mod A Supine to sit Mod A Rolling to Right Mod A Rolling to Left Mod A Undulating mattress for wound management on standard bed (elevated-so often doing uphill sliding board transfers); she would like to continue working on sitting up independently, she has been working on rolling, needs less  assistance w/ this when someone props her leg into hooklying; would like something to help her pull her left leg to her butt for stretching as well as bed mobility.  TRANSFERS: Pt continues using combination of bump over, slide board, and depression (squat pivot) transfers.   FUNCTIONAL TESTS:  None relevant to pt's current functional level and abilities.  PATIENT SURVEYS:  None completed due to time.  TODAY'S TREATMENT:                                                                                                                               Aquatic therapy at [facility] - pool temperature 90 degrees   Patient seen for aquatic therapy today.  Treatment took place in water 3.6-4.0 feet deep depending upon activity.  Patient entered and exited the pool via chair lift using slideboard both directions at [facility] going to chair lift but more mod/maxA level (fatigued at end of session requiring more assist). Also providing cues for forward/lateral weight shift for better head/hip relationship.   Assisted over to pool bench to perform the following- Exercises: -Passive BLE stretching all planes w/ pt supported upright on bench;  -Following seated stretching, pts caregiver present in pool to assist therefore assisted into tall kneel onto 5" step with bench in front of her.  Once in position,  pt able to support with BUEs on bench, PT and caregiver ensuring LE placement by light pressure on calf.  Performed mini squats x 20 reps with cues for decreased UE support and more hip extension.  PT stabilized RLE while caregiver guided (to avoid scrapping toes) hip abd x 10 reps, hip ext x 10 reps.  Switched sides so that PT could stabilize LLE while RLE performed hip abd and ext x 10 reps with caregiver guiding as before.  Tactile and verbal cues for improved weight shift and stance side hip extension.   -Assisted back to seated position with feet supported on step and PT/caregiver ensuring feet remained in contact with step while performing chest press with smaller barbells x 10 reps, alt UE flex x 10 reps and lateral trunk leans/reaches with emphasis on trunk shortening and lengthening x 8 reps each direction with up to mod A at times due to LOB.   -Donned B ankle weights (3.5lbs) prior to standing/gait.   -Performed "Three muskateer style" gait with PT and caregiver on either side of pt with PT demonstrating how to stabilize leg in stance and how to assist with swing.  Ambulated across pool x 18' with light assist initially but pt fatiguing towards the end needing more assist for RLE swing, stance and weight shifting.    Assisted back to chair lift and transferred to shower chair as stated above.  Patient requires buoyancy of the water for support for reduced fall risk with gait training and balance exercises with CGA-maxA support. Exercises able to be performed safely in water without the risk of fall compared  to those same exercises performed on land; viscosity of water needed for resistance for strengthening. Current of water provides perturbations for challenging static and dynamic balance.     PATIENT EDUCATION: Education details: aquatic rationale Person educated: Patient and Arts administrator Education method: Explanation Education comprehension: verbalized understanding  HOME EXERCISE  PROGRAM: Will be established as needed as pt has done continuous therapy and is working towards functional tasks.  GOALS: Goals reviewed with patient? Yes  SHORT TERM GOALS: Target date: 05/06/2023  HEP to be established for stretching and strengthening as needed. Baseline:  To be established. Goal status: INITIAL  2.  Pt will be able to perform rolling L and R w/ no more than minA in order to improve functional mobility. Baseline: modA Goal status: INITIAL  3.  Pt will perform sit<>supine requiring no more than minA in order to improve functional mobility. Baseline: modA Goal status: INITIAL  4.  Pt will perform bilateral reach outside seated BOS with feet supported without LOB in order to improve safety with ADL performance. Baseline: lateral LOB bilaterally demonstrated in w/c Goal status: INITIAL  LONG TERM GOALS: Target date: 06/03/2023  Pt will perform squat pivot transfer w/ no more than modA in order to improve access to home environment for toilet transfers. Baseline: pt reports recent practice with depression transfers Goal status: INITIAL  2.  Pt will perform sit<>supine requiring no more than supervision in order to improve transfers in and out of bed. Baseline:  modA Goal status: INITIAL  3.  Aquatic HEP to be established with caregiver assistance as appropriate. Baseline:  To be established. Goal status: INITIAL  4.  Pt will be able to perform rolling L and R w/ no more than supervision in order to improve functional mobility. Baseline: modA Goal status: INITIAL  5.  Pt will perform stand pivot transfer w/ no more than modA in order to improve toilet transfer for ongoing bladder training. Baseline:  To be assessed. Goal status: INITIAL  6.  Pt will stand from no more than 20 inch seat height with no more than modA to initiate transfer with maintained stand for >/=5 minutes in order to improve trunk and LE strength and stability for functional mobility and  transfers. Baseline: To be assessed. Goal status: INITIAL  ASSESSMENT:  CLINICAL IMPRESSION: Aquatic session continues to focus on LE active movement and core strengthening.  Pts caregiver in pool today so we were able to perform tall kneel tasks, sitting balance and gait across pool.  Pts RLE continues to fatigue quickly during tasks, needing more assist with increased repetition, but does well with core and postural tasks today.     OBJECTIVE IMPAIRMENTS: decreased balance, decreased coordination, decreased mobility, difficulty walking, decreased ROM, decreased strength, hypomobility, increased edema, impaired flexibility, impaired sensation, impaired tone, impaired UE functional use, improper body mechanics, postural dysfunction, and pain.   ACTIVITY LIMITATIONS: carrying, lifting, bending, sitting, standing, squatting, stairs, transfers, bed mobility, continence, bathing, toileting, dressing, reach over head, hygiene/grooming, locomotion level, and caring for others  PARTICIPATION LIMITATIONS: meal prep, cleaning, laundry, driving, and community activity  PERSONAL FACTORS: Age, Fitness, Past/current experiences, Time since onset of injury/illness/exacerbation, and 1-2 comorbidities: intermittent AD, neurogenic bowel/bladder on active bladder training, osteoporosis  are also affecting patient's functional outcome.   REHAB POTENTIAL: Good  CLINICAL DECISION MAKING: Evolving/moderate complexity  EVALUATION COMPLEXITY: Moderate  PLAN:  PT FREQUENCY: 2x/week (1x/wk land and 1x/wk in pool)  PT DURATION: 8 weeks  PLANNED INTERVENTIONS: 16109- PT Re-evaluation,  97110-Therapeutic exercises, 97530- Therapeutic activity, [id number]- Neuromuscular re-education, [phone]- Self Care, 56213- Manual therapy, [phone]- Gait training, [phone]- Orthotic Fit/training, [phone]- Aquatic Therapy, 97014- Electrical stimulation (unattended), [phone]- Electrical stimulation (manual), Patient/Family education, Balance  training, Dry Needling, DME instructions, Wheelchair mobility training, Moist heat, Therapeutic exercises, Therapeutic activity, Neuromuscular re-education, Gait training, and Self Care  PLAN FOR NEXT SESSION:  Multilevel slide board transfers vs stand pivots.  Core strength, seated reaching/trunk righting.  Bed mobility.  Use stander or stedy to practice weight-bearing and core as tolerated.  Practice stands from progressively lowered surface. Standing with LRAD.  Pt will switch to leg bag for aquatics as she used at [place]. - She has been cleared by Shanda Bumps for use of leg bag in pool and informed of need to clean well after pool due to possible irritation.  [name] (caregiver) is willing to get in pool to assist for more involved activities like tall kneel, etc. Pt may be going to [place] for prolonged period in November (probably not until Thanksgiving)  Harriet Butte, PT, MPT [facility] [address] [place], [place], [address] Phone: [phone]   Fax:  [phone] 05/03/23, 1:01 PM

## 2023-05-03 NOTE — Telephone Encounter (Signed)
Thank you. PA has been submitted and approved. Pharmacy has already filled it, so I do not know the cost.

## 2023-05-03 NOTE — Telephone Encounter (Signed)
Pharmacy Patient Advocate Encounter  Received notification from [facility] that Prior Authorization for [employer] has been APPROVED from 06/14/22 to 06/13/24  Filled today.   PA #/Case ID/Reference #: PA Case ID #: [id number]

## 2023-05-04 ENCOUNTER — Encounter: Payer: Self-pay | Admitting: Internal Medicine

## 2023-05-04 NOTE — Telephone Encounter (Signed)
I dont think there is a med interaction  of amox and methenamine but I would have your urology team  opine on this  sometimes we stop this med while during active treatment

## 2023-05-04 NOTE — Progress Notes (Unsigned)
No chief complaint on file.   HPI: [name] 72 y.o. come in for fu  mild anemia noted  at visit   Had hematuria  and pos ucx fo enterococcus and recently given  amox ROS: See pertinent positives and negatives per HPI.  Past Medical History:  Diagnosis Date   CERVICAL POLYP 03/11/2008   Qualifier: Diagnosis of  By: [name] MD, [name]    Colon polyps 2005   on colonscopy [name]   Fibroid 2004   Per Dr. [name]   History of shingles    face and mouth   Hx of skin cancer, basal cell    Rosacea    Sciatica of left side 09/28/2013   Scoliosis    noted on mri done for back pain    Family History  Problem Relation Age of Onset   Hypertension Father    Osteoporosis Other    Breast cancer Neg Hx     Social History   Socioeconomic History   Marital status: Married    Spouse name: Not on file   Number of children: Not on file   Years of education: Not on file   Highest education level: Doctorate  Occupational History   Not on file  Tobacco Use   Smoking status: Never   Smokeless tobacco: Never  Vaping Use   Vaping status: Never Used  Substance and Sexual Activity   Alcohol use: Not Currently    Alcohol/week: 7.0 standard drinks of alcohol    Types: 7 Glasses of wine per week   Drug use: Yes    Comment: wine at night   Sexual activity: Not on file  Other Topics Concern   Not on file  Social History Narrative   Married   Spouse had CABG    [employer] professor PhD   Ple[employer]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   Had moved to [place]   hh of 2    Quadripareisis from [employer] injury     No current pets       Social Determinants of Corporate investment banker Strain: Low Risk  (05/01/2023)   Overall Financial Resource Strain (CARDIA)    Difficulty of Paying Living Expenses: Not hard at all  Food Insecurity: No Food Insecurity (05/01/2023)   Hunger Vital Sign    Worried About Running Out of Food in the Last Year: Never true    Ran Out of Food in the Last Year: Never true   Transportation Needs: No Transportation Needs (05/01/2023)   PRAPARE - Administrator, Civil Service (Medical): No    Lack of Transportation (Non-Medical): No  Physical Activity: Inactive (05/01/2023)   Exercise Vital Sign    Days of Exercise per Week: 0 days    Minutes of Exercise per Session: 60 min  Stress: No Stress Concern Present (05/01/2023)   [employer] of Occupational Health - Occupational Stress Questionnaire    Feeling of Stress : Only a little  Social Connections: Moderately Integrated (05/01/2023)   Social Connection and Isolation Panel [NHANES]    Frequency of Communication with Friends and Family: More than three times a week    Frequency of Social Gatherings with Friends and Family: Once a week    Attends Religious Services: Never    Database administrator or Organizations: Yes    Attends Engineer, structural: More than 4 times per year    Marital Status: Married    Outpatient Medications Prior to  Visit  Medication Sig Dispense Refill   amoxicillin (AMOXIL) 500 MG capsule Take 1 capsule (500 mg total) by mouth 3 (three) times daily. 15 capsule 0   amoxicillin-clavulanate (AUGMENTIN) 875-125 MG tablet Take 1 tablet by mouth 2 (two) times daily. 28 tablet 0   ascorbic acid (VITAMIN C) 1000 MG tablet Take 1 tablet (1,000 mg total) by mouth in the morning, at noon, in the evening, and at bedtime. 120 tablet 5   atorvastatin (LIPITOR) 20 MG tablet TAKE 1 TABLET BY MOUTH EVERY DAY 90 tablet 1   baclofen (LIORESAL) 20 MG tablet Take 2 tablets (40 mg total) by mouth 3 (three) times daily. For spasticity 540 tablet 3   bisacodyl (DULCOLAX) 10 MG suppository Place 10 mg rectally as needed for moderate constipation. Insert one suppository per rectum with each bowel program procedure.     cholecalciferol (VITAMIN D3) 25 MCG (1000 UNIT) tablet Take 1,000 Units by mouth daily. 2000u     [employer] COENZYME Q-10 100 MG capsule Take 100 mg by mouth daily.       Docusate Sodium (DSS) 100 MG CAPS Take by mouth.     famotidine (PEPCID) 20 MG tablet Take 20 mg by mouth 2 (two) times daily.     fesoterodine (TOVIAZ) 8 MG TB24 tablet Take 1 tablet (8 mg total) by mouth daily. 90 tablet 1   gabapentin (NEURONTIN) 600 MG tablet TAKE 2 TABLETS (1,200 MG TOTAL) BY MOUTH 3 (THREE) TIMES DAILY. 270 tablet 1   methenamine (HIPREX) 1 g tablet TAKE 1 TABLET BY MOUTH TWICE A DAY WITH MEALS 60 tablet 5   metroNIDAZOLE (METROGEL) 1 % gel APPLY TOPICALLY EVERY DAY 60 g 10   mirabegron ER (MYRBETRIQ) 25 MG TB24 tablet Take 50 mg by mouth.     Multiple Vitamins-Minerals (CENTRUM SILVER ULTRA WOMENS PO) Take by mouth.     mupirocin ointment (BACTROBAN) 2 % Apply 1 Application topically 2 (two) times daily. 30 g 2   MYRBETRIQ 50 MG TB24 tablet TAKE 1 TABLET BY MOUTH EVERY DAY 30 tablet 10   naproxen sodium (ALEVE) 220 MG tablet Take 220 mg by mouth. Tablet p.o per package directions as needed for pain     nortriptyline (PAMELOR) 50 MG capsule Take 1 capsule (50 mg total) by mouth at bedtime. 90 capsule 1   SANTYL 250 UNIT/GM ointment      SENNA CO by Combination route. Sennosides 8.6mg  tab p.o per package directions daily as needed to promote bowel movement.     Teriparatide (FORTEO) 600 MCG/2.4ML SOPN Inject 20 mcg into the skin daily in the afternoon. FORTEO 2.4 mL 5   UNABLE TO FIND Med Name: Saline Enema Per rectum per package directions as needed for relief of constipation.     zolpidem (AMBIEN) 5 MG tablet Take 0.5 tablets (2.5 mg total) by mouth at bedtime as needed for sleep. 45 tablet 0   No facility-administered medications prior to visit.     EXAM:  There were no vitals taken for this visit.  There is no height or weight on file to calculate BMI.  GENERAL: vitals reviewed and listed above, alert, oriented, appears well hydrated and in no acute distress HEENT: atraumatic, conjunctiva  clear, no obvious abnormalities on inspection of external nose and ears  OP : no lesion edema or exudate  NECK: no obvious masses on inspection palpation  LUNGS: clear to auscultation bilaterally, no wheezes, rales or rhonchi, good air movement CV: HRRR, no clubbing cyanosis  or  peripheral edema nl cap refill  MS: moves all extremities without noticeable focal  abnormality PSYCH: pleasant and cooperative, no obvious depression or anxiety Lab Results  Component Value Date   WBC 3.9 (L) 04/16/2023   HGB 11.4 (L) 04/16/2023   HCT 35.5 (L) 04/16/2023   PLT 200 04/16/2023   GLUCOSE 87 04/16/2023   CHOL 136 10/14/2022   TRIG 64.0 10/14/2022   HDL 53.80 10/14/2022   LDLDIRECT 162.3 03/21/2012   LDLCALC 70 10/14/2022   ALT 33 04/16/2023   AST 32 04/16/2023   NA 138 04/16/2023   K 3.8 04/16/2023   CL 101 04/16/2023   CREATININE 0.35 (L) 04/16/2023   BUN 9 04/16/2023   CO2 30 04/16/2023   TSH 1.72 10/14/2022   BP Readings from Last 3 Encounters:  04/25/23 106/67  04/16/23 120/70  03/16/23 103/63    ASSESSMENT AND PLAN:  Discussed the following assessment and plan:  No diagnosis found.  -Patient advised to return or notify health care team  if  new concerns arise.  There are no Patient Instructions on file for this visit.   [name]. Fender Herder M.D.

## 2023-05-05 ENCOUNTER — Ambulatory Visit: Payer: Medicare PPO | Admitting: Physical Therapy

## 2023-05-05 ENCOUNTER — Ambulatory Visit: Payer: Medicare PPO | Admitting: Internal Medicine

## 2023-05-05 ENCOUNTER — Encounter: Payer: Self-pay | Admitting: Internal Medicine

## 2023-05-05 ENCOUNTER — Ambulatory Visit: Payer: Medicare PPO | Admitting: Occupational Therapy

## 2023-05-05 VITALS — BP 99/68 | HR 77 | Temp 99.0°F | Ht 64.0 in | Wt 113.0 lb

## 2023-05-05 DIAGNOSIS — G8254 Quadriplegia, C5-C7 incomplete: Secondary | ICD-10-CM

## 2023-05-05 DIAGNOSIS — M24541 Contracture, right hand: Secondary | ICD-10-CM | POA: Diagnosis not present

## 2023-05-05 DIAGNOSIS — R208 Other disturbances of skin sensation: Secondary | ICD-10-CM

## 2023-05-05 DIAGNOSIS — R29818 Other symptoms and signs involving the nervous system: Secondary | ICD-10-CM

## 2023-05-05 DIAGNOSIS — M6281 Muscle weakness (generalized): Secondary | ICD-10-CM | POA: Diagnosis not present

## 2023-05-05 DIAGNOSIS — R278 Other lack of coordination: Secondary | ICD-10-CM | POA: Diagnosis not present

## 2023-05-05 DIAGNOSIS — R29898 Other symptoms and signs involving the musculoskeletal system: Secondary | ICD-10-CM

## 2023-05-05 DIAGNOSIS — Z79899 Other long term (current) drug therapy: Secondary | ICD-10-CM

## 2023-05-05 DIAGNOSIS — R293 Abnormal posture: Secondary | ICD-10-CM | POA: Diagnosis not present

## 2023-05-05 DIAGNOSIS — N390 Urinary tract infection, site not specified: Secondary | ICD-10-CM | POA: Diagnosis not present

## 2023-05-05 DIAGNOSIS — M24542 Contracture, left hand: Secondary | ICD-10-CM

## 2023-05-05 DIAGNOSIS — D649 Anemia, unspecified: Secondary | ICD-10-CM

## 2023-05-05 DIAGNOSIS — R319 Hematuria, unspecified: Secondary | ICD-10-CM

## 2023-05-05 DIAGNOSIS — R2689 Other abnormalities of gait and mobility: Secondary | ICD-10-CM | POA: Diagnosis not present

## 2023-05-05 LAB — CBC WITH DIFFERENTIAL/PLATELET
Basophils Absolute: 0 10*3/uL (ref 0.0–0.1)
Basophils Relative: 0.8 % (ref 0.0–3.0)
Eosinophils Absolute: 0.1 10*3/uL (ref 0.0–0.7)
Eosinophils Relative: 2.3 % (ref 0.0–5.0)
HCT: 37 % (ref 36.0–46.0)
Hemoglobin: 12.4 g/dL (ref 12.0–15.0)
Lymphocytes Relative: 33.3 % (ref 12.0–46.0)
Lymphs Abs: 1.4 10*3/uL (ref 0.7–4.0)
MCHC: 33.5 g/dL (ref 30.0–36.0)
MCV: 94 fL (ref 78.0–100.0)
Monocytes Absolute: 0.3 10*3/uL (ref 0.1–1.0)
Monocytes Relative: 7.3 % (ref 3.0–12.0)
Neutro Abs: 2.4 10*3/uL (ref 1.4–7.7)
Neutrophils Relative %: 56.3 % (ref 43.0–77.0)
Platelets: 203 10*3/uL (ref 150.0–400.0)
RBC: 3.94 Mil/uL (ref 3.87–5.11)
RDW: 15.2 % (ref 11.5–15.5)
WBC: 4.3 10*3/uL (ref 4.0–10.5)

## 2023-05-05 LAB — VITAMIN B12: Vitamin B-12: >1537 pg/mL — ABNORMAL HIGH (ref 211–911)

## 2023-05-05 LAB — IBC + FERRITIN
Ferritin: 81.7 ng/mL (ref 10.0–291.0)
Iron: 54 ug/dL (ref 42–145)
Saturation Ratios: 18.6 % — ABNORMAL LOW (ref 20.0–50.0)
TIBC: 289.8 ug/dL (ref 250.0–450.0)
Transferrin: 207 mg/dL — ABNORMAL LOW (ref 212.0–360.0)

## 2023-05-05 MED ORDER — AMOXICILLIN 500 MG PO CAPS: 500.0000 mg | ORAL_CAPSULE | Freq: Three times a day (TID) | ORAL | 0 refills | Status: DC

## 2023-05-05 NOTE — Therapy (Addendum)
OUTPATIENT PHYSICAL THERAPY NEURO TREATMENT   Patient Name: [name] MRN: [id number] DOB:11-05-1950, 72 y.o., female Today's Date: 05/06/2023   PCP: [name], MD REFERRING PROVIDER: Maryjane Hurter, MD  END OF SESSION:   PT End of Session - 05/05/23 1453     Visit Number 9    Number of Visits 17   16 + eval   Date for PT Re-Evaluation 06/10/23   to allow for scheduling delays   Authorization Type [employer] MEDICARE    Progress Note Due on Visit 10    PT Start Time 1450    PT Stop Time 1530    PT Time Calculation (min) 40 min    Equipment Utilized During Treatment Gait belt;Other (comment)   floatation devices as needed for safety   Activity Tolerance Patient tolerated treatment well    Behavior During Therapy [facility] for tasks assessed/performed                 Past Medical History:  Diagnosis Date   CERVICAL POLYP 03/11/2008   Qualifier: Diagnosis of  By: [name] MD, [name]    Colon polyps 2005   on colonscopy [name]   Fibroid 2004   Per Dr. [name]   History of shingles    face and mouth   Hx of skin cancer, basal cell    Rosacea    Sciatica of left side 09/28/2013   Scoliosis    noted on mri done for back pain   Past Surgical History:  Procedure Laterality Date   BUNIONECTOMY     Patient Active Problem List   Diagnosis Date Noted   Buttock wound, left, subsequent encounter 03/16/2023   Bronchiectasis with acute exacerbation (HCC) 03/15/2023   Buttock wound, left, initial encounter 11/02/2022   Orthostatic hypotension 08/13/2022   Neurogenic bowel 05/03/2022   Spasticity 05/03/2022   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/03/2022   Nerve pain 05/03/2022   Medication monitoring encounter 01/08/2022   Neurogenic bladder 10/11/2021   Urinary incontinence 10/11/2021   ESBL (extended spectrum beta-lactamase) producing bacteria infection 10/09/2021   Recurrent UTI 10/09/2021   Quadriplegia, C5-C7 incomplete (HCC) 01/16/2021   History of spinal  fracture 01/16/2021   Suprapubic catheter (HCC) 01/16/2021   Encounter for routine gynecological examination 09/28/2013   Onychomycosis 09/28/2013   Foot deformity, acquired 03/26/2012   Encounter for preventive health examination 12/25/2010   ROSACEA 08/25/2009   Disturbance in sleep behavior 03/11/2008   SKIN CANCER, HX OF 03/11/2008   DYSURIA, HX OF 03/11/2008   Hyperlipidemia 02/10/2007   CERVICALGIA 02/10/2007    ONSET DATE: 03/11/2023 (most recent referral)  REFERRING DIAG: G82.54 (ICD-10-CM) - Quadriplegia, C5-C7 incomplete  THERAPY DIAG:  Muscle weakness (generalized)  Other symptoms and signs involving the nervous system  Abnormal posture  Quadriplegia, C5-C7 incomplete (HCC)  Other lack of coordination  Rationale for Evaluation and Treatment: Rehabilitation  SUBJECTIVE:  SUBJECTIVE STATEMENT:  Pt received seated in [facility] w/ caregiver present. Pt reports no acute changes.   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 No  PRECAUTIONS: Fall; suprapubic catheter (she wants to get this removed meaning she needs to get to and from the toilet); she has had minor heat sensation when needing to complete her bowel program-possible AD?; pt has recently returned to wearing binder since pneumonia due to breathing and core stability.  Pt/caregiver performs bladder clamping prior to transfers as pt is still doing bladder training.  WEIGHT BEARING RESTRICTIONS: No-Follow fall precautions, adequate dairy in diet and exercises (aerobic, balancing and weight bearing) as tolerated. - most recent endocrinologist note.  FALLS: Has patient fallen in last 6 months?  No  LIVING ENVIRONMENT: Lives with: lives with their spouse and live-in nurse [name] Lives in: House/apartment-townhouse Stairs: No-level entry, but multi-level home 4 stories w/ elevator Has following equipment at home: Wheelchair (power), Wheelchair (manual), Grab bars, and standing frame, sliding board, transport shower chair, handheld shower head, leg strap-pt reports she no longer finds this helpful  PLOF: Requires assistive device for independence, Needs assistance with ADLs, Needs assistance with homemaking, and Needs assistance with transfers  OCCUPATION:  Writer-uses Dragon to dictate  PATIENT GOALS: "Make my right leg work."  Stand and pivot so she can more easily access a toilet.  OBJECTIVE:   DIAGNOSTIC FINDINGS:  ASSESSMENT: The BMD measured at Forearm Radius 33% is 0.506 g/cm2 with a T-score of -4.2. This patient is considered osteoporotic according to World Health [facility]) criteria.  COGNITION: Overall cognitive status: Within functional limits for tasks assessed   SENSATION: Light touch: Diminished ability to distinguish sharp and dull, but able to distinguish light touch from injury level down accurately  COORDINATION: Not formally assessed.  EDEMA:  Well managed w/ lymphatic massage and compression stockings.  MUSCLE TONE: Pt has intermittent clonus during transfers.  POSTURE: rounded shoulders, posterior pelvic tilt, and right thoracic scoliosis  LOWER EXTREMITY ROM:     Passive  Right Eval Left Eval  Hip flexion See below.  Hip extension   Hip abduction   Hip adduction   Hip internal rotation   Hip external rotation   Knee flexion   Knee extension   Ankle dorsiflexion   Ankle plantarflexion   Ankle inversion   Ankle eversion    (Blank rows = not tested)  LOWER EXTREMITY MMT:    MMT Right Eval Left Eval  Hip flexion Left knee extension almost to neutral.  Left DF just passed neutral.  Hip extension   Hip abduction   Hip  adduction   Hip internal rotation   Hip external rotation   Knee flexion   Knee extension   Ankle dorsiflexion   Ankle plantarflexion   Ankle inversion   Ankle eversion   (Blank rows = not tested)  BED MOBILITY:  Sit to supine Mod A Supine to sit Mod A Rolling to Right Mod A Rolling to Left Mod A Undulating mattress for wound management on standard bed (elevated-so often doing uphill sliding board transfers); she would like to continue working on sitting up independently, she has been working on rolling, needs less assistance w/ this when someone props her leg into hooklying; would like something to help her pull her left leg to her butt for stretching as well as bed mobility.  TRANSFERS: Pt continues using combination of bump over, slide board, and depression (squat pivot) transfers.   FUNCTIONAL TESTS:  None relevant to pt's current functional level and abilities.  PATIENT SURVEYS:  None completed due to time.  TODAY'S TREATMENT:                                                                                                                              TherAct Pt received seated in PWC. Scapular depression transfer x4 to L from PWC to mat table w/ min to mod A provided at posterior thighs for lifting.   Sit to stand from elevated mat table to Stedy standing for 18.56 seconds w/ min A to guide hips; 2nd standing time for 28.06 seconds w/ CGA; 3rd standing time for 38.78 seconds w/ CGA, at completion sit to mat table; pt demonstrates good upright posture this date with hips forwards and equal weightbearing through LEs "that's me trying to be intentional". Pt does strongly rely on UE support due to level of incomplete SCI.  Seated EOM Boxing w/ B feet on floor: Estate agent: R index finger DIP remaining curled despite pressure 1st set ipsilateral punches until fatigue to various heights and distances for increased core activation w/ CGA throughout for safety, 1 instance of Min  A to recover from L forwards reach out of BOS 2nd and 3rd sets contralateral punches until fatigue to various heights and distances for increased core activation, CGA throughout for safety Seated rest between sets of boxing punches  Scapular depression transfer x2 to R from elevated mat table to [facility] w/ min to mod A provided at posterior thighs for lifting.  Pt left seated in PWC at end of session w/ caregiver, going to OT.  PATIENT EDUCATION:  Education details: POC Person educated: Patient and Arts administrator Education method: Explanation Education comprehension: verbalized understanding  HOME EXERCISE PROGRAM:  Will be established as needed as pt has done continuous therapy and is working towards functional tasks.  GOALS: Goals reviewed with patient? Yes  SHORT TERM GOALS: Target date: 05/06/2023  HEP to be established for stretching and strengthening as needed. Baseline:  To be established. Goal status: INITIAL  2.  Pt will be able to perform rolling L and R w/ no more than minA in order to improve functional mobility. Baseline: modA Goal status: INITIAL  3.  Pt will perform sit<>supine requiring no more than minA in order to improve functional mobility. Baseline: modA Goal status: INITIAL  4.  Pt will perform bilateral reach outside seated BOS with feet supported without LOB in order to improve safety with ADL performance. Baseline: lateral LOB bilaterally demonstrated in w/c Goal status: INITIAL  LONG TERM GOALS: Target date: 06/03/2023  Pt will perform squat pivot transfer w/ no more than modA in order to improve access to home environment for toilet transfers. Baseline: pt reports recent practice with depression transfers Goal status: INITIAL  2.  Pt will  perform sit<>supine requiring no more than supervision in order to improve transfers in and out of bed. Baseline:  modA Goal status: INITIAL  3.  Aquatic HEP to be established with caregiver assistance as  appropriate. Baseline:  To be established. Goal status: INITIAL  4.  Pt will be able to perform rolling L and R w/ no more than supervision in order to improve functional mobility. Baseline: modA Goal status: INITIAL  5.  Pt will perform stand pivot transfer w/ no more than modA in order to improve toilet transfer for ongoing bladder training. Baseline:  To be assessed. Goal status: INITIAL  6.  Pt will stand from no more than 20 inch seat height with no more than modA to initiate transfer with maintained stand for >/=5 minutes in order to improve trunk and LE strength and stability for functional mobility and transfers. Baseline: To be assessed. Goal status: INITIAL  ASSESSMENT:  CLINICAL IMPRESSION:  Emphasis of skilled PT session on standing to stedy with good form and seated EOM balance activities. Pt demonstrates decreased standing time this date but improved form with forwards hips and equal weightbearing in BLEs. Pt w/ improving seated balance this date, notable improvements in reaching anterior and inferior out of BOS in comparison to prior sessions. Continue POC.   OBJECTIVE IMPAIRMENTS: decreased balance, decreased coordination, decreased mobility, difficulty walking, decreased ROM, decreased strength, hypomobility, increased edema, impaired flexibility, impaired sensation, impaired tone, impaired UE functional use, improper body mechanics, postural dysfunction, and pain.   ACTIVITY LIMITATIONS: carrying, lifting, bending, sitting, standing, squatting, stairs, transfers, bed mobility, continence, bathing, toileting, dressing, reach over head, hygiene/grooming, locomotion level, and caring for others  PARTICIPATION LIMITATIONS: meal prep, cleaning, laundry, driving, and community activity  PERSONAL FACTORS: Age, Fitness, Past/current experiences, Time since onset of injury/illness/exacerbation, and 1-2 comorbidities: intermittent AD, neurogenic bowel/bladder on active bladder  training, osteoporosis  are also affecting patient's functional outcome.   REHAB POTENTIAL: Good  CLINICAL DECISION MAKING: Evolving/moderate complexity  EVALUATION COMPLEXITY: Moderate  PLAN:  PT FREQUENCY: 2x/week (1x/wk land and 1x/wk in pool)  PT DURATION: 8 weeks  PLANNED INTERVENTIONS: 16109- PT Re-evaluation, 97110-Therapeutic exercises, 97530- Therapeutic activity, 97112- Neuromuscular re-education, 97535- Self Care, 60454- Manual therapy, [phone]- Gait training, [phone]- Orthotic Fit/training, [phone]- Aquatic Therapy, 97014- Electrical stimulation (unattended), [phone]- Electrical stimulation (manual), Patient/Family education, Balance training, Dry Needling, DME instructions, Wheelchair mobility training, Moist heat, Therapeutic exercises, Therapeutic activity, Neuromuscular re-education, Gait training, and Self Care  PLAN FOR NEXT SESSION:  10th visit PN and d/c, Multilevel slide board transfers vs stand pivots.  Core strength, seated reaching/trunk righting.  Bed mobility.  Use stander or stedy to practice weight-bearing and core as tolerated.  Practice stands from progressively lowered surface. Standing with LRAD.  Pt will switch to leg bag for aquatics as she used at [place]. - She has been cleared by Shanda Bumps for use of leg bag in pool and informed of need to clean well after pool due to possible irritation.  [name] (caregiver) is willing to get in pool to assist for more involved activities like tall kneel, etc. Pt may be going to [place] for prolonged period in November (probably not until Thanksgiving)  Beverely Low, SPT  [facility] [address] [place], [place], [address] Phone: [phone]   Fax:  [phone] 05/06/23, 12:11 PM

## 2023-05-05 NOTE — Patient Instructions (Signed)
Will refill the amoxicillin  and take 7 days   or as directed by Urology Will send info to Dr Lonn [place] office. Check took for blood as we discussed.  Lab today. For anemia check

## 2023-05-05 NOTE — Therapy (Addendum)
OUTPATIENT OCCUPATIONAL THERAPY NEURO TREATMENT  Patient Name: [name] MRN: [id number] DOB:04-20-1951, 72 y.o., female Today's Date: 05/05/2023  PCP: [name], MD  REFERRING PROVIDER: Maryjane Hurter, MD   END OF SESSION:  OT End of Session - 05/05/23 1633     Visit Number 7    Number of Visits 17    Date for OT Re-Evaluation 06/10/23    Authorization Type [employer] - requires auth, [place]    Progress Note Due on Visit 10    OT Start Time 0340    OT Stop Time 0420    OT Time Calculation (min) 40 min    Activity Tolerance Patient tolerated treatment well    Behavior During Therapy [facility] for tasks assessed/performed                 Past Medical History:  Diagnosis Date   CERVICAL POLYP 03/11/2008   Qualifier: Diagnosis of  By: [name] MD, [name]    Colon polyps 2005   on colonscopy [name]   Fibroid 2004   Per Dr. [name]   History of shingles    face and mouth   Hx of skin cancer, basal cell    Rosacea    Sciatica of left side 09/28/2013   Scoliosis    noted on mri done for back pain   Past Surgical History:  Procedure Laterality Date   BUNIONECTOMY     Patient Active Problem List   Diagnosis Date Noted   Buttock wound, left, subsequent encounter 03/16/2023   Bronchiectasis with acute exacerbation (HCC) 03/15/2023   Buttock wound, left, initial encounter 11/02/2022   Orthostatic hypotension 08/13/2022   Neurogenic bowel 05/03/2022   Spasticity 05/03/2022   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/03/2022   Nerve pain 05/03/2022   Medication monitoring encounter 01/08/2022   Neurogenic bladder 10/11/2021   Urinary incontinence 10/11/2021   ESBL (extended spectrum beta-lactamase) producing bacteria infection 10/09/2021   Recurrent UTI 10/09/2021   Quadriplegia, C5-C7 incomplete (HCC) 01/16/2021   History of spinal fracture 01/16/2021   Suprapubic catheter (HCC) 01/16/2021   Encounter for routine gynecological examination 09/28/2013    Onychomycosis 09/28/2013   Foot deformity, acquired 03/26/2012   Encounter for preventive health examination 12/25/2010   ROSACEA 08/25/2009   Disturbance in sleep behavior 03/11/2008   SKIN CANCER, HX OF 03/11/2008   DYSURIA, HX OF 03/11/2008   Hyperlipidemia 02/10/2007   CERVICALGIA 02/10/2007    ONSET DATE: 07/28/2020  Date of Referral 03/23/2023  REFERRING DIAG: G82.54 (ICD-10-CM) - Quadriplegia, C5-C7, incomplete (HCC)  THERAPY DIAG:  Other lack of coordination  Muscle weakness (generalized)  Other symptoms and signs involving the musculoskeletal system  Other symptoms and signs involving the nervous system  Contracture of hand joint, right  Contracture of hand joint, left  Quadriplegia, C5-C7 incomplete (HCC)  Other disturbances of skin sensation  Rationale for Evaluation and Treatment: Rehabilitation  SUBJECTIVE:   SUBJECTIVE STATEMENT: Pt requested to work on donning earrings. Pt reports things are going okay and pt brought personal laptop/keyboard today. Pt reports shoulder exercises are working well within pt's routine.  Pt reported upcoming trip to [place] for the next 3 months.  Pt accompanied by:  Live in Caregiver - [name]   PERTINENT HISTORY: "Pt is a 72 yr old L handed female with hx of incomplete quadriplegia- 2/14 2022- fleeing the police in [place] on passenger 100 (high speed) miles/hour,  [name]; neurogenic bowel and bladder and spasticity; [name], has low BP and  HLD. Here for f/u on Incomplete quadriplegia"  PRECAUTIONS: Fall; suprapubic catheter (she wants to get this removed meaning she needs to get to and from the toilet); she has had minor heat sensation when needing to complete her bowel program-possible AD?   WEIGHT BEARING RESTRICTIONS: No  PAIN: - reports average pain as noted below. Will notify therapist if there are changes in her pain.  Are you having pain? Yes: NPRS scale: 3/10 Pain location: fingers to elbow  bilaterally Pain description: constant Aggravating factors: it can increased over time ie) is worse at the end of the day.  Also cold affects cramps and function. Relieving factors: gabapentin and baclofen for spasms, nightly stretching  FALLS: Has patient fallen in last 6 months? No  LIVING ENVIRONMENT: Lives with: lives with their family - husband [name] and with an adult companion s/p moving back up from [place] x10 months Lives in: House/apartment Stairs:  4 story town house with an Engineer, structural with threshold adjustments, roll in shower with transport chair Has following equipment at home: Wheelchair (power) - with seat height adjustments to access counters and reclining option, Wheelchair (manual), transport WC, shower chair, and Ramped entry, handheld showerhead with rails around toilet, had Michiel Sites but is no longer in need of it, has slide boards x3  PLOF: Requires assistive device for independence, Needs assistance with ADLs, Needs assistance with homemaking, Needs assistance with gait, and Needs assistance with transfers; 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  Used to like to knit, sew and bake.  PATIENT GOALS: improve L shoulder pain, ROM, and strength; core strength  OBJECTIVE:   HAND DOMINANCE: Left  ADLs: Overall ADLs: Patient has a live in caregiver  Transfers/ambulation related to ADLs: min assist with sliding board transfers.  Eating: Has a rocker knife that she can use. Used to use adapted utensils but now uses regular utensils but still will get assistance to cut food ie) when eating out.  Grooming: can brush her own hair with LUE only; unable to manage jewelry ie) earrings  UB Dressing: can zip/unzip after it has been started, unable to manage buttons herself, Caregiver assists but if she has extra time, she can put on her bra, and a loose fitting pullover shirt/t-shirt  LB Dressing: dependent for LB dressing in bed and with special sock donner for LE  compression garments   Toileting: bladder trained with suprapubic catheter which she clamps off.  Dependent for bowel incontinence care.  Bathing: Sponge bath with adult washclothes.  Can bathe UB with back scrubber for most of her back.  Needs help with feet (mentioned she might need a separate brush for feet)   Tub Shower transfers: Min assist with slide board  Equipment: Shower seat with back, Walk in shower, bed side commode, Reacher, Sock aid, Long handled sponge, and Feeding equipment  IADLs: --  Shopping: Assisted by caregiver  Light housekeeping: Has housekeeper that comes monthly  Meal Prep: previously enjoyed baking. Assisted by caregiver but has reheated a meal for herself after getting food out of the fridge/freezer from her WC.  Community mobility: Dependent  Medication management: Caregiver sorts them into pillbox but she is very aware of her medications   Financial management: Patient manages her own finances  Handwriting: Increased time and has a pen with a little grip  MOBILITY STATUS: Independent with power mobility  ACTIVITY TOLERANCE: Activity tolerance: good to Fair - MMT WFL but has limited sustained tolerance for ongoing use of Ues with  poor trunk control  FUNCTIONAL OUTCOME MEASURES:   Total score = sum of the activity scores/number of activities Minimum detectable change (90%CI) for average score = 2 points Minimum detectable change (90%CI) for single activity score = 3 points   UPPER EXTREMITY ROM:   AROM - WFL without obvious contractures, some digital flexion noted but PROM WNL   UPPER EXTREMITY MMT:   Grossly WFL - Endurance limited R tricep strength > than L but L UE generally stronger than R UE  MMT Right (eval) Left (eval)  Shoulder flexion 4/5 4/5  Shoulder abduction 4/5 4/5  Elbow flexion 4/5 4/5  Elbow extension 4/5 4-/5  (Blank rows = not tested)  HAND FUNCTION: Grip strength: Right: 5.2 lbs; Left: 15.6 lbs  COORDINATION: 9  Hole Peg test: Right: able to place all 9 and remove 4 pegs in 3 minutes sec; Left: 84 sec R hand finger eventually cramps and dexterity worsens in the cold  SENSATION: Light touch: Impaired  - patient   EDEMA: NA for UEs but LE has poor lymph drainage with custom compression garments   MUSCLE TONE: Generally WFL   COGNITION: Overall cognitive status: Within functional limits for tasks assessed  VISION: Subjective report: Patent wears progressive lens/glasses.  Denies diplopia or vision changes. Baseline vision: Wears glasses all the time  VISION ASSESSMENT: WFL  OBSERVATIONS: Patient independent with power WC navigation within clinic.  Patient is well-kept with foley catheter in place.  She has slight limitations in full extension of digits but PROM is WNL.    TODAY'S TREATMENT:                                                                                        TherAct:  No ARCEX e-stim unit used today during OT session d/t pt wishing to attempt self-care tasks without requiring ARCEX setup in order to simulate home setup.  Typing task with pt's personal computer/keyboard - to improve ind for typing tasks, to improve FM coordination and dexterity, to improve understanding of ergonomics/body mechanics during typing tasks. OT evaluated pt's personal computer setup. Pt's keyboard had space between keys with flat keys which improved pt's dexterity for typing. Pt demo'd good accuracy and consistent speed with hunt-and-peck method of typing using personal laptop computer. OT noted pt's computer had predictive text. OT and pt discussed predictive text options and keyboard shortcuts for commonly used words. Pt acknowledged understanding. OT provided education on ergonomic setup options, including elevating monitor and placing firm stiff cushion under laptop keyboard for increased surface area of workspace and to improve wrist support. Pt verbalized and demo'd understanding of setup  options.  Self-Care  Earrings - to increase ind with donning earrings functional task, to improve FM coordination and dexterity - Pt demo'd difficulty with donning/doffing earrings without assistance despite v/c, extra time, and visual cues of an upright mirror. Therefore, OT educated pt on options for adaptive earrings, such as clip-on earrings, using larger back for earrings, and earrings with magnetic backs. Pt verbalized understanding.  PATIENT EDUCATION: Education details: see today's treatment above Person educated: Patient and Caregiver Live in caregiver - [name] Education method: Explanation, Demonstration,  Verbal cues, and Handouts Education comprehension: verbalized understanding, returned demonstration, and needs further education  HOME EXERCISE PROGRAM: 04/25/23 - Weight-shifting (handout provided, see pt instructions) 04/29/23 - Shoulder AROM ([employer], Access Code: [email or web address]), online typing tasks and ergonomic keyboard name (handout)   GOALS:   SHORT TERM GOALS: Target date: 05/06/2023    Patient will be able to use AE/modified techniques to cut soft foods small enough for oral intake. Baseline: Caregiver/spouse assist Goal status: INITIAL  2.  Pt will be independent with BUE braces/splints as needed to prevent contracture and improve functional use of hands.  Baseline: Caregiver/spouse assist; pt lacks carryover from [facility] Goal status: IN progress  3.  Pt will be independent with use of ARCEX device as needed for carryover and improvement of BUE function.  Baseline: Caregiver/spouse assist; unable to independently recall protocol for use Goal status: 04/29/23 - MET. Pt reports as ind as possible with device. Pt requires setup assistance to place electrodes from caregiver/spouse though pt adjusts settings of e-stim unit ind.   LONG TERM GOALS: Target date: 06/10/23   Patient will demonstrate independence with updated HEP for UE strengthening, coordination  and ROM to prevent contractures and maintain strength for transfers and ADLs. Baseline: Previous HEPs have been established but need to be reviewed and updated.  Goal status: INITIAL  2.  Patient will report at least two-point increase in average PSFS score or at least three-point increase in a single activity score indicating functionally significant improvement given minimum detectable change.  Baseline: 0.5 total score (See above for individual activity scores)   3.  Pt will demonstrate ability to place 5lb item bimanually on table at least 1' in front of her without using support of elbows, without leaning against back of chair, and with good control.  Baseline: unable Goal status: INITIAL  4.  Pt will report increased ability to push up through LUE with completion of transfers. Baseline: unable Goal status: INITIAL   ASSESSMENT:  CLINICAL IMPRESSION: Pt tolerated typing tasks well and verbalized understanding of adaptive strategies and A/E to improve ergonomics and efficiency with typing. Pt demo'd difficulty with BUE FM coordination for donning earrings, therefore it is recommended to further discuss adaptive earrings options with pt PRN. Pt would benefit from skilled OT services to address deficits and increase overall independence.    PERFORMANCE DEFICITS: in functional skills including ADLs, IADLs, coordination, dexterity, strength, muscle spasms, Fine motor control, Gross motor control, continence, skin integrity, and UE functional use,   IMPAIRMENTS: are limiting patient from ADLs, IADLs, work, and leisure.   CO-MORBIDITIES: has co-morbidities such as incontinence and wound  that affects occupational performance. Patient will benefit from skilled OT to address above impairments and improve overall function.  REHAB POTENTIAL: Fair due to chronicity of injury  PLAN:  OT FREQUENCY: 2x/week  OT DURATION: 8 weeks  PLANNED INTERVENTIONS: self care/ADL training, therapeutic  exercise, therapeutic activity, neuromuscular re-education, manual therapy, passive range of motion, balance training, functional mobility training, splinting, patient/family education, energy conservation, coping strategies training, and DME and/or AE instructions  RECOMMENDED OTHER SERVICES: Patient was seen for PT evaluation today with treatment plans coordinated for 2x/week.  CONSULTED AND AGREED WITH PLAN OF CARE: Patient and family member/caregiver  PLAN FOR NEXT SESSION:  Assess progress towards goals D/C from OT next visit d/t pt's upcoming trip to [place] Provide examples of adaptive earrings options - magnets, larger earring backings, clip-on earrings Review LB dressing strategies if time allows  Wynetta Emery, OT  05/05/2023, 4:45 PM

## 2023-05-09 ENCOUNTER — Ambulatory Visit: Payer: Medicare PPO | Admitting: Physical Therapy

## 2023-05-09 ENCOUNTER — Ambulatory Visit: Payer: Medicare PPO | Admitting: Occupational Therapy

## 2023-05-09 ENCOUNTER — Encounter: Payer: Self-pay | Admitting: Physical Medicine and Rehabilitation

## 2023-05-09 DIAGNOSIS — G8254 Quadriplegia, C5-C7 incomplete: Secondary | ICD-10-CM | POA: Diagnosis not present

## 2023-05-09 DIAGNOSIS — R293 Abnormal posture: Secondary | ICD-10-CM

## 2023-05-09 DIAGNOSIS — R29898 Other symptoms and signs involving the musculoskeletal system: Secondary | ICD-10-CM | POA: Diagnosis not present

## 2023-05-09 DIAGNOSIS — M24541 Contracture, right hand: Secondary | ICD-10-CM

## 2023-05-09 DIAGNOSIS — R208 Other disturbances of skin sensation: Secondary | ICD-10-CM | POA: Diagnosis not present

## 2023-05-09 DIAGNOSIS — R2689 Other abnormalities of gait and mobility: Secondary | ICD-10-CM

## 2023-05-09 DIAGNOSIS — M6281 Muscle weakness (generalized): Secondary | ICD-10-CM

## 2023-05-09 DIAGNOSIS — R278 Other lack of coordination: Secondary | ICD-10-CM | POA: Diagnosis not present

## 2023-05-09 DIAGNOSIS — G8250 Quadriplegia, unspecified: Secondary | ICD-10-CM

## 2023-05-09 DIAGNOSIS — R29818 Other symptoms and signs involving the nervous system: Secondary | ICD-10-CM

## 2023-05-09 NOTE — Therapy (Signed)
OUTPATIENT OCCUPATIONAL THERAPY NEURO TREATMENT / DISCHARGE SUMMARY  Patient Name: [name] MRN: [id number] DOB:[birth year], 72 y.o., female Today's Date: 05/09/2023  PCP: [name], MD  REFERRING PROVIDER: Maryjane Hurter, MD   OCCUPATIONAL THERAPY DISCHARGE SUMMARY  Visits from Start of Care: 8  Current functional level related to goals / functional outcomes: Pt has met 100% of STG and LTG to satisfactory levels.  Remaining deficits: Decreased strength and coordination secondary to SCI.  Education / Equipment: Pt has needed materials and education as needed prior to upcoming out-of-state trip. Pt understands how to continue on with self-management. See tx notes for more details.    Patient goals were met. Patient is being discharged due to meeting stated rehab goals and the patient's request d/t upcoming out-of-state trip lasting 3 months.      END OF SESSION:  OT End of Session - 05/09/23 1434     Visit Number 8    Number of Visits 17    Date for OT Re-Evaluation 06/10/23    Authorization Type [employer] - requires auth, [place]    Progress Note Due on Visit 10    OT Start Time 0245    OT Stop Time 0329    OT Time Calculation (min) 44 min    Activity Tolerance Patient tolerated treatment well    Behavior During Therapy [facility] for tasks assessed/performed                 Past Medical History:  Diagnosis Date   CERVICAL POLYP 03/11/2008   Qualifier: Diagnosis of  By: [name] MD, [name]    Colon polyps 2005   on colonscopy [name]   Fibroid 2004   Per Dr. [name]   History of shingles    face and mouth   Hx of skin cancer, basal cell    Rosacea    Sciatica of left side 09/28/2013   Scoliosis    noted on mri done for back pain   Past Surgical History:  Procedure Laterality Date   BUNIONECTOMY     Patient Active Problem List   Diagnosis Date Noted   Buttock wound, left, subsequent encounter 03/16/2023   Bronchiectasis with acute  exacerbation (HCC) 03/15/2023   Buttock wound, left, initial encounter 11/02/2022   Orthostatic hypotension 08/13/2022   Neurogenic bowel 05/03/2022   Spasticity 05/03/2022   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/03/2022   Nerve pain 05/03/2022   Medication monitoring encounter 01/08/2022   Neurogenic bladder 10/11/2021   Urinary incontinence 10/11/2021   ESBL (extended spectrum beta-lactamase) producing bacteria infection 10/09/2021   Recurrent UTI 10/09/2021   Quadriplegia, C5-C7 incomplete (HCC) 01/16/2021   History of spinal fracture 01/16/2021   Suprapubic catheter (HCC) 01/16/2021   Encounter for routine gynecological examination 09/28/2013   Onychomycosis 09/28/2013   Foot deformity, acquired 03/26/2012   Encounter for preventive health examination 12/25/2010   ROSACEA 08/25/2009   Disturbance in sleep behavior 03/11/2008   SKIN CANCER, HX OF 03/11/2008   DYSURIA, HX OF 03/11/2008   Hyperlipidemia 02/10/2007   CERVICALGIA 02/10/2007    ONSET DATE: 07/28/2020  Date of Referral 03/23/2023  REFERRING DIAG: G82.54 (ICD-10-CM) - Quadriplegia, C5-C7, incomplete (HCC)  THERAPY DIAG:  Other lack of coordination  Muscle weakness (generalized)  Other symptoms and signs involving the musculoskeletal system  Other symptoms and signs involving the nervous system  Contracture of hand joint, right  Quadriplegia, C5-C7 incomplete (HCC)  Other disturbances of skin sensation  Rationale for Evaluation and Treatment: Rehabilitation  SUBJECTIVE:   SUBJECTIVE STATEMENT: Pt reports preparing to go to [place] and that things are "good."  Pt accompanied by:  Live in Caregiver - [name]   PERTINENT HISTORY: "Pt is a 72 yr old L handed female with hx of incomplete quadriplegia- 2/14 2022- fleeing the police in [place] on passenger 100 (high speed) miles/hour,  [name]; neurogenic bowel and bladder and spasticity; [name], [name] Here for f/u on Incomplete  quadriplegia"  PRECAUTIONS: Fall; suprapubic catheter (she wants to get this removed meaning she needs to get to and from the toilet); she has had minor heat sensation when needing to complete her bowel program-possible AD?   WEIGHT BEARING RESTRICTIONS: No  PAIN: - reports average pain as noted below. Will notify therapist if there are changes in her pain.  Are you having pain? Yes: NPRS scale: 4/10 Pain location: fingers to elbow bilaterally Pain description: constant Aggravating factors: it can increased over time ie) is worse at the end of the day.  Also cold affects cramps and function. Relieving factors: gabapentin and baclofen for spasms, nightly stretching  FALLS: Has patient fallen in last 6 months? No  LIVING ENVIRONMENT: Lives with: lives with their family - husband [name] and with an adult companion s/p moving back up from [place] x10 months Lives in: House/apartment Stairs:  4 story town house with an Engineer, structural with threshold adjustments, roll in shower with transport chair Has following equipment at home: Wheelchair (power) - with seat height adjustments to access counters and reclining option, Wheelchair (manual), transport WC, shower chair, and Ramped entry, handheld showerhead with rails around toilet, had Michiel Sites but is no longer in need of it, has slide boards x3  PLOF: Requires assistive device for independence, Needs assistance with ADLs, Needs assistance with homemaking, Needs assistance with gait, and Needs assistance with transfers; 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  Used to like to knit, sew and bake.  PATIENT GOALS: improve L shoulder pain, ROM, and strength; core strength  OBJECTIVE:   HAND DOMINANCE: Left  ADLs: Overall ADLs: Patient has a live in caregiver  Transfers/ambulation related to ADLs: min assist with sliding board transfers.  Eating: Has a rocker knife that she can use. Used to use adapted utensils but now uses regular  utensils but still will get assistance to cut food ie) when eating out.  Grooming: can brush her own hair with LUE only; unable to manage jewelry ie) earrings  UB Dressing: can zip/unzip after it has been started, unable to manage buttons herself, Caregiver assists but if she has extra time, she can put on her bra, and a loose fitting pullover shirt/t-shirt  LB Dressing: dependent for LB dressing in bed and with special sock donner for LE compression garments   Toileting: bladder trained with suprapubic catheter which she clamps off.  Dependent for bowel incontinence care.  Bathing: Sponge bath with adult washclothes.  Can bathe UB with back scrubber for most of her back.  Needs help with feet (mentioned she might need a separate brush for feet)   Tub Shower transfers: Min assist with slide board  Equipment: Shower seat with back, Walk in shower, bed side commode, Reacher, Sock aid, Long handled sponge, and Feeding equipment  IADLs: --  Shopping: Assisted by caregiver  Light housekeeping: Has housekeeper that comes monthly  Meal Prep: previously enjoyed baking. Assisted by caregiver but has reheated a meal for herself after getting food out of the  fridge/freezer from her WC.  Community mobility: Dependent  Medication management: Caregiver sorts them into pillbox but she is very aware of her medications   Financial management: Patient manages her own finances  Handwriting: Increased time and has a pen with a little grip  MOBILITY STATUS: Independent with power mobility  ACTIVITY TOLERANCE: Activity tolerance: good to Fair - MMT WFL but has limited sustained tolerance for ongoing use of Ues with poor trunk control  FUNCTIONAL OUTCOME MEASURES:   Total score = sum of the activity scores/number of activities Minimum detectable change (90%CI) for average score = 2 points Minimum detectable change (90%CI) for single activity score = 3 points    05/09/23 update:  PSFS -  6.5  *Pt reports greater success with push-button flip top water bottles and using bottle opener device for opening bottles with ring seal.   UPPER EXTREMITY ROM:   AROM - WFL without obvious contractures, some digital flexion noted but PROM WNL   UPPER EXTREMITY MMT:   Grossly WFL - Endurance limited R tricep strength > than L but L UE generally stronger than R UE  MMT Right (eval) Left (eval)  Shoulder flexion 4/5 4/5  Shoulder abduction 4/5 4/5  Elbow flexion 4/5 4/5  Elbow extension 4/5 4-/5  (Blank rows = not tested)  HAND FUNCTION: Grip strength: Right: 5.2 lbs; Left: 15.6 lbs  COORDINATION: 9 Hole Peg test: Right: able to place all 9 and remove 4 pegs in 3 minutes sec; Left: 84 sec R hand finger eventually cramps and dexterity worsens in the cold  SENSATION: Light touch: Impaired  - patient   EDEMA: NA for UEs but LE has poor lymph drainage with custom compression garments   MUSCLE TONE: Generally WFL   COGNITION: Overall cognitive status: Within functional limits for tasks assessed  VISION: Subjective report: Patent wears progressive lens/glasses.  Denies diplopia or vision changes. Baseline vision: Wears glasses all the time  VISION ASSESSMENT: WFL  OBSERVATIONS: Patient independent with power WC navigation within clinic.  Patient is well-kept with foley catheter in place.  She has slight limitations in full extension of digits but PROM is WNL.    TODAY'S TREATMENT:                                                                                        TherAct: OT educated pt and pt's caregiver on adaptive options for earrings, including magnetic earrings, clip-on earrings. Handout with examples provided, see pt instructions. Pt acknowledged understanding.  OT assessed pt's progress towards goals, see below for updates and objective measures above for specific activities of PSFS.   TherEx: Pt requested additional exercises to work on ROM of BUE. Therefore,  OT updated HEP and provided education regarding AAROM, PROM, and AROM movements of BUE. Pt returned demonstration of exercises. Updated HEP to improve BUE ROM and to improve BUE FM coordination and dexterity. Access Code: [email or web address] URL: [email or web address] Date: 05/09/2023 Prepared by: Carilyn Goodpasture  Exercises - Thumb Opposition  - 1 x daily - 1 sets - 10 reps - Seated Composite Thumb Flexion AROM  - 1 x daily - 1 sets - 10 reps -  Thumb AROM Radial Abduction and Adduction  - 1 x daily - 1 sets - 10 reps  - Handwritten notes on pt's handout and verbal instructions: Primarily for R thumb d/t limited AROM for additional R thumb movements.  Pt may complete exercises with L thumb per pt preference. Complete movements with palm down on tabletop, with lateral hand resting on tabletop, and/or with hand in air. - Seated Wrist Prayer Stretch  - 1 x daily - 1 sets - 10 reps - 10 hold - Handwritten notes on pt's handout: 1) Focus on straightening L middle finger. 2) Focus on straightening L ring finger. 3) Focus on lining up B little fingers. - Finger Spreading  - 1 x daily - 1 sets - 10 reps - Hand PROM Finger Extension  - 1 x daily - 1 sets - 10 reps - V/c and handwritten notes on pt's handout: Gentle stretch using opposite hand. Cut fingernails to preserve skin integrity.    PATIENT EDUCATION: Education details: see today's treatment above, UE anatomy, OT D/C, updated HEP, skin integrity, adaptive options and A/E for earrings Person educated: Patient and Caregiver Live in caregiver - [name] Education method: Explanation, Demonstration, Verbal cues, and Handouts Education comprehension: verbalized understanding, returned demonstration, and needs further education  HOME EXERCISE PROGRAM: 04/25/23 - Weight-shifting (handout provided, see pt instructions) 04/29/23 - Shoulder AROM (Medbridge, Access Code: [email or web address]), online typing tasks and ergonomic keyboard name (handout)  05/09/23 - Adaptive  earrings options (handout, see pt instructions), BUE ROM and coordination HEP (Medbridge Access Code: [email or web address])  GOALS:   SHORT TERM GOALS: Target date: 05/06/2023    Patient will be able to use AE/modified techniques to cut soft foods small enough for oral intake. Baseline: Caregiver/spouse assist 05/09/23 - Pt reports ind cutting soft foods often using fork. Pt continues to require assistance for more difficult to cut foods, such as steak. Goal status: 05/09/23 - MET  2.  Pt will be independent with BUE braces/splints as needed to prevent contracture and improve functional use of hands.  Baseline: Caregiver/spouse assist; pt lacks carryover from [facility] 05/09/23 - Pt requires assistance from caregiver to don braces and to position strap of braces. Pt verbalized understanding of use of brace and reported wearing braces every night. Goal status: 05/09/23 - MET  3.  Pt will be independent with use of ARCEX device as needed for carryover and improvement of BUE function.  Baseline: Caregiver/spouse assist; unable to independently recall protocol for use Goal status: 04/29/23 - MET. Pt reports as ind as possible with device. Pt requires setup assistance to place electrodes from caregiver/spouse though pt adjusts settings of e-stim unit ind.   LONG TERM GOALS: Target date: 06/10/23   Patient will demonstrate independence with updated HEP for UE strengthening, coordination and ROM to prevent contractures and maintain strength for transfers and ADLs. Baseline: Previous HEPs have been established but need to be reviewed and updated.  05/09/23 - Pt ind demo'd understanding of weight-shifting exercises and shoulder AROM exercises.  Goal status: 05/09/23 - MET  2.  Patient will report at least two-point increase in average PSFS score or at least three-point increase in a single activity score indicating functionally significant improvement given minimum detectable change.  Baseline: 0.5  total score (See above for individual activity scores)  05/09/23 - PSFS - 6.5 using A/E and adaptive strategies PRN (see objective measures above for individual activity scores and details) Goal status: 05/09/23 - MET  3.  Pt will demonstrate ability to  place 5lb item bimanually on table at least 1' in front of her without using support of elbows, without leaning against back of chair, and with good control.  Baseline: unable 05/09/23 - Pt moved 5 lb kettle bell several inches with each hand separately and bimanually with supinated grasp. Goal status: 05/09/23 - MET  4.  Pt will report increased ability to push up through LUE with completion of transfers. Baseline: unable 05/09/23 - Pt demo'd ability to push up on L side using LUE for support and pt reports feeling confident to complete weight-shifting motion for daily tasks (toileting, transfers, weight-shifting.) Goal status: 05/09/23 - MET   ASSESSMENT:  CLINICAL IMPRESSION: Pt met 100% of stated rehab goals today. Patient is being discharged from OT today due to meeting stated rehab goals and the patient's request d/t upcoming out-of-state trip lasting 3 months.    PERFORMANCE DEFICITS: in functional skills including ADLs, IADLs, coordination, dexterity, strength, muscle spasms, Fine motor control, Gross motor control, continence, skin integrity, and UE functional use,   IMPAIRMENTS: are limiting patient from ADLs, IADLs, work, and leisure.   CO-MORBIDITIES: has co-morbidities such as incontinence and wound  that affects occupational performance. Patient will benefit from skilled OT to address above impairments and improve overall function.  REHAB POTENTIAL: Fair due to chronicity of injury  PLAN:  OT FREQUENCY: 2x/week  OT DURATION: 8 weeks  PLANNED INTERVENTIONS: self care/ADL training, therapeutic exercise, therapeutic activity, neuromuscular re-education, manual therapy, passive range of motion, balance training, functional  mobility training, splinting, patient/family education, energy conservation, coping strategies training, and DME and/or AE instructions  RECOMMENDED OTHER SERVICES: Patient was seen for PT evaluation today with treatment plans coordinated for 2x/week.  CONSULTED AND AGREED WITH PLAN OF CARE: Patient and family member/caregiver  PLAN FOR NEXT SESSION:  N/A - Pt D/C from OT today  Wynetta Emery, OT 05/09/2023, 3:51 PM

## 2023-05-09 NOTE — Therapy (Cosign Needed)
OUTPATIENT PHYSICAL THERAPY NEURO DISCHARGE +10th VISIT PROGRESS NOTE   Patient Name: [name] MRN: [id number] DOB:[birth year], 72 y.o., female Today's Date: 05/09/2023  PHYSICAL THERAPY DISCHARGE SUMMARY  Visits from Start of Care: 10  Current functional level related to goals / functional outcomes: Mod I- Max A   Remaining deficits: Core strength, balance, functional mobility   Education / Equipment: HEP   Patient agrees to discharge. Patient goals were partially met. Patient is being discharged due to the patient's request.   PCP: [name], MD REFERRING PROVIDER: Maryjane Hurter, MD  END OF SESSION:   PT End of Session - 05/09/23 1538     Visit Number 10    Number of Visits 17   16 + eval   Date for PT Re-Evaluation 06/10/23   to allow for scheduling delays   Authorization Type [employer]IC[employer]    Progress Note Due on Visit 10    PT Start Time 1534    PT Stop Time 1615    PT Time Calculation (min) 41 min    Equipment Utilized During Treatment Gait belt;Other (comment)   floatation devices as needed for safety   Activity Tolerance Patient tolerated treatment well    Behavior During Therapy [facility] for tasks assessed/performed                  Past Medical History:  Diagnosis Date   CERVICAL POLYP 03/11/2008   Qualifier: Diagnosis of  By: [name] MD, [name]    Colon polyps 2005   on colonscopy [name]   Fibroid 2004   Per Dr. [name]   History of shingles    face and mouth   Hx of skin cancer, basal cell    Rosacea    Sciatica of left side 09/28/2013   Scoliosis    noted on mri done for back pain   Past Surgical History:  Procedure Laterality Date   BUNIONECTOMY     Patient Active Problem List   Diagnosis Date Noted   Buttock wound, left, subsequent encounter 03/16/2023   Bronchiectasis with acute exacerbation (HCC) 03/15/2023   Buttock wound, left, initial encounter 11/02/2022   Orthostatic hypotension 08/13/2022   Neurogenic  bowel 05/03/2022   Spasticity 05/03/2022   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/03/2022   Nerve pain 05/03/2022   Medication monitoring encounter 01/08/2022   Neurogenic bladder 10/11/2021   Urinary incontinence 10/11/2021   ESBL (extended spectrum beta-lactamase) producing bacteria infection 10/09/2021   Recurrent UTI 10/09/2021   Quadriplegia, C5-C7 incomplete (HCC) 01/16/2021   History of spinal fracture 01/16/2021   Suprapubic catheter (HCC) 01/16/2021   Encounter for routine gynecological examination 09/28/2013   Onychomycosis 09/28/2013   Foot deformity, acquired 03/26/2012   Encounter for preventive health examination 12/25/2010   ROSACEA 08/25/2009   Disturbance in sleep behavior 03/11/2008   SKIN CANCER, HX OF 03/11/2008   DYSURIA, HX OF 03/11/2008   Hyperlipidemia 02/10/2007   CERVICALGIA 02/10/2007    ONSET DATE: 03/11/2023 (most recent referral)  REFERRING DIAG: G82.54 (ICD-10-CM) - Quadriplegia, C5-C7 incomplete  THERAPY DIAG:  Muscle weakness (generalized)  Quadriplegia, C5-C7 incomplete (HCC)  Other symptoms and signs involving the nervous system  Other symptoms and signs involving the musculoskeletal system  Abnormal posture  Other abnormalities of gait and mobility  Rationale for Evaluation and Treatment: Rehabilitation  SUBJECTIVE:  SUBJECTIVE STATEMENT:  Pt received seated in [facility] w/ caregiver present. Pt reports no acute changes. Pt will be back in [place] in around February/March.   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 No  PRECAUTIONS: Fall; suprapubic catheter (she wants to get this removed meaning she needs to get to and from the toilet);  she has had minor heat sensation when needing to complete her bowel program-possible AD?; pt has recently returned to wearing binder since pneumonia due to breathing and core stability.  Pt/caregiver performs bladder clamping prior to transfers as pt is still doing bladder training.  WEIGHT BEARING RESTRICTIONS: No-Follow fall precautions, adequate dairy in diet and exercises (aerobic, balancing and weight bearing) as tolerated. - most recent endocrinologist note.  FALLS: Has patient fallen in last 6 months? No  LIVING ENVIRONMENT: Lives with: lives with their spouse and live-in nurse [name] Lives in: House/apartment-townhouse Stairs: No-level entry, but multi-level home 4 stories w/ elevator Has following equipment at home: Wheelchair (power), Wheelchair (manual), Grab bars, and standing frame, sliding board, transport shower chair, handheld shower head, leg strap-pt reports she no longer finds this helpful  PLOF: Requires assistive device for independence, Needs assistance with ADLs, Needs assistance with homemaking, and Needs assistance with transfers  OCCUPATION:  Writer-uses Dragon to dictate  PATIENT GOALS: "Make my right leg work."  Stand and pivot so she can more easily access a toilet.  OBJECTIVE:   DIAGNOSTIC FINDINGS:  ASSESSMENT: The BMD measured at Forearm Radius 33% is 0.506 g/cm2 with a T-score of -4.2. This patient is considered osteoporotic according to World Health Organization [facility]) criteria.  COGNITION: Overall cognitive status: Within functional limits for tasks assessed   SENSATION: Light touch: Diminished ability to distinguish sharp and dull, but able to distinguish light touch from injury level down accurately  COORDINATION: Not formally assessed.  EDEMA:  Well managed w/ lymphatic massage and compression stockings.  MUSCLE TONE: Pt has intermittent clonus during transfers.  POSTURE: rounded shoulders, posterior pelvic tilt, and right thoracic  scoliosis  LOWER EXTREMITY ROM:     Passive  Right Eval Left Eval  Hip flexion See below.  Hip extension   Hip abduction   Hip adduction   Hip internal rotation   Hip external rotation   Knee flexion   Knee extension   Ankle dorsiflexion   Ankle plantarflexion   Ankle inversion   Ankle eversion    (Blank rows = not tested)  LOWER EXTREMITY MMT:    MMT Right Eval Left Eval  Hip flexion Left knee extension almost to neutral.  Left DF just passed neutral.  Hip extension   Hip abduction   Hip adduction   Hip internal rotation   Hip external rotation   Knee flexion   Knee extension   Ankle dorsiflexion   Ankle plantarflexion   Ankle inversion   Ankle eversion   (Blank rows = not tested)  BED MOBILITY:  Sit to supine Mod A Supine to sit Mod A Rolling to Right Mod A Rolling to Left Mod A Undulating mattress for wound management on standard bed (elevated-so often doing uphill sliding board transfers); she would like to continue working on sitting up independently, she has been working on rolling, needs less assistance w/ this when someone props her leg into hooklying; would like something to help her pull her left leg to  her butt for stretching as well as bed mobility.  TRANSFERS: Pt continues using combination of bump over, slide board, and depression (squat pivot) transfers.   FUNCTIONAL TESTS:  None relevant to pt's current functional level and abilities.  PATIENT SURVEYS:  None completed due to time.  TODAY'S TREATMENT:                                                                                                                              TherAct Pt received seated in PWC. Scapular depression transfer x3 to L from PWC to mat table w/ min to mod A provided at posterior thighs for lifting.   Sit to supine w/ Mod A for BLE movement Roll to R w/ Min A for LLE placement R sidelying to supine w/ min A for LLE placement/ uncrossing at completion  Bridging and  scooting to R initially mod a for BLE assistance, then add lifting and shifting assistance at hips  Roll to L w/ Min A for RLE placement L sidelying to Supine mod I  Bridging scooting to L, attempted w/o hip assistance initially, then add lifting and shifting assistance at hips Pt able to perform bridge this date, difficulty w/ side translation  Supine to sit w/ Max A for BLE placement and core control Sit to supine w/ Mod A for BLE movement Sidelying to sit w/ Max A for BLE placement and core control   Stand pivot transfer to R w/ Max A to stand, Mod A to pivot from mat table to w/c- visible contraction of LLE  Stand pivot transfer to L w/ Max A to stand, Mod A to pivot from w/c to mat table Stand pivot transfer to R w/ Max A to stand, Mod A to pivot from mat table to w/c   Pt left seated in PWC at end of session w/ caregiver.   PATIENT EDUCATION:  Education details: POC, potential things to work on in [place] w/ PT Person educated: Patient and Arts administrator Education method: Explanation Education comprehension: verbalized understanding  HOME EXERCISE PROGRAM:  Will be established as needed as pt has done continuous therapy and is working towards functional tasks.  GOALS: Goals reviewed with patient? Yes  SHORT TERM GOALS: Target date: 05/06/2023  HEP to be established for stretching and strengthening as needed. Baseline:  To be established. Goal status: INITIAL  2.  Pt will be able to perform rolling L and R w/ no more than minA in order to improve functional mobility. Baseline: modA Goal status: INITIAL  3.  Pt will perform sit<>supine requiring no more than minA in order to improve functional mobility. Baseline: modA Goal status: INITIAL  4.  Pt will perform bilateral reach outside seated BOS with feet supported without LOB in order to improve safety with ADL performance. Baseline: lateral LOB bilaterally demonstrated in w/c Goal status: INITIAL  LONG TERM  GOALS: Target date: 06/03/2023   Pt will perform squat pivot  transfer w/ no more than modA in order to improve access to home environment for toilet transfers. Baseline: pt reports recent practice with depression transfers, mod A (05/09/23) Goal status: MET  2.  Pt will perform sit<>supine requiring no more than supervision in order to improve transfers in and out of bed. Baseline:  modA, mod A (05/09/23) Goal status: NOT MET  3.  Aquatic HEP to be established with caregiver assistance as appropriate. Baseline:  To be established; pt only able to perform a few sessions of aquatic therapy before discharge Goal status: NOT MET  4.  Pt will be able to perform rolling L and R w/ no more than supervision in order to improve functional mobility. Baseline: modA, min A (05/09/23) Goal status: NOT MET  5.  Pt will perform stand pivot transfer w/ no more than modA in order to improve toilet transfer for ongoing bladder training. Baseline:  Max A to stand, Mod A to pivot (05/09/23) Goal status: NOT MET  6.  Pt will stand from no more than 20 inch seat height with no more than modA to initiate transfer with maintained stand for >/=5 minutes in order to improve trunk and LE strength and stability for functional mobility and transfers. Baseline: pt stood to stedy 38.78 seconds w/ CGA (05/05/23) Goal status: NOT MET  ASSESSMENT:  CLINICAL IMPRESSION:  Emphasis of skilled PT session on assessing LTGs for discharge from this POC. Pt has met 1/5 LTGs and not met 4/5 LTGs assessed this date. Pt requires Mod A for squat-pivot, Max A to stand and Mod A to pivot for stand-pivot transfers, Mod A for sit<>supine, and Min A to roll L and R. Pt is discharging due to leaving the state for an extended period of time, intends to return around February or March for continued PT for functional mobility gains.   OBJECTIVE IMPAIRMENTS: decreased balance, decreased coordination, decreased mobility, difficulty walking,  decreased ROM, decreased strength, hypomobility, increased edema, impaired flexibility, impaired sensation, impaired tone, impaired UE functional use, improper body mechanics, postural dysfunction, and pain.   ACTIVITY LIMITATIONS: carrying, lifting, bending, sitting, standing, squatting, stairs, transfers, bed mobility, continence, bathing, toileting, dressing, reach over head, hygiene/grooming, locomotion level, and caring for others  PARTICIPATION LIMITATIONS: meal prep, cleaning, laundry, driving, and community activity  PERSONAL FACTORS: Age, Fitness, Past/current experiences, Time since onset of injury/illness/exacerbation, and 1-2 comorbidities: intermittent AD, neurogenic bowel/bladder on active bladder training, osteoporosis  are also affecting patient's functional outcome.   REHAB POTENTIAL: Good  CLINICAL DECISION MAKING: Evolving/moderate complexity  EVALUATION COMPLEXITY: Moderate   Beverely Low, SPT  [facility] [address] [place], [place], [address] Phone: [phone]   Fax:  [phone] 05/09/23, 5:51 PM

## 2023-05-09 NOTE — Patient Instructions (Addendum)
     Seated wrist prayer stretch: 1) Focus on straightening L middle finger. 2) Focus on straightening L ring finger. 3) Focus on lining up B little fingers.

## 2023-05-10 ENCOUNTER — Telehealth: Payer: Self-pay

## 2023-05-10 NOTE — Progress Notes (Signed)
Blood count is better  now normal ....no anemia but some  mild iron deficiencies    b12 is high if on supplementation for b12 can decrease dose by 1/2

## 2023-05-10 NOTE — Telephone Encounter (Signed)
[name] her nurse is calling asking How much Vitamin D and Calcium should she be taking with the Forteo, please advise?

## 2023-05-10 NOTE — Telephone Encounter (Signed)
I spoke to [place] and she is aware

## 2023-05-16 NOTE — Telephone Encounter (Signed)
Here's the referral I just told you about- thanks- ML

## 2023-05-19 ENCOUNTER — Telehealth: Payer: Self-pay | Admitting: Internal Medicine

## 2023-05-19 DIAGNOSIS — N390 Urinary tract infection, site not specified: Secondary | ICD-10-CM | POA: Diagnosis not present

## 2023-05-19 NOTE — Telephone Encounter (Signed)
Sensitivity and culture results from last urine culture loaded to mychart for provider in [place] to read. They're treating her for UTI

## 2023-05-23 ENCOUNTER — Telehealth: Payer: Self-pay

## 2023-05-23 NOTE — Telephone Encounter (Signed)
[name] is calling stating that she was taking Forteo for two weeks straight and then she all of a sudden started with dizzness and Vertigo she stopped the Forteo and now the dizziness has subsided, could the Forteo be causing this kind of reaction, please advise ?

## 2023-05-24 ENCOUNTER — Other Ambulatory Visit: Payer: Self-pay | Admitting: "Endocrinology

## 2023-05-24 ENCOUNTER — Telehealth: Payer: Self-pay

## 2023-05-24 MED ORDER — TERIPARATIDE 600 MCG/2.4ML ~~LOC~~ SOPN: 20.0000 ug | PEN_INJECTOR | Freq: Every day | SUBCUTANEOUS | 3 refills | Status: DC

## 2023-05-24 NOTE — Telephone Encounter (Signed)
After I gave your advice of the forteo should not have taken two weeks to cause the vertigo , [name] is stating that the on call provider stopped the Forteo over the weekend so they need an order to restart it if you want her to continue taking it

## 2023-05-25 ENCOUNTER — Other Ambulatory Visit: Payer: Self-pay

## 2023-05-25 MED ORDER — NORTRIPTYLINE HCL 50 MG PO CAPS: 50.0000 mg | ORAL_CAPSULE | Freq: Every day | ORAL | 1 refills | Status: DC

## 2023-05-25 NOTE — Telephone Encounter (Signed)
Caregiver is aware 

## 2023-05-30 ENCOUNTER — Telehealth: Payer: Self-pay | Admitting: Internal Medicine

## 2023-05-30 ENCOUNTER — Encounter: Payer: Self-pay | Admitting: Internal Medicine

## 2023-05-30 NOTE — Telephone Encounter (Signed)
[name] call and stated pt had a Secal Occult blood test that was positive and pt is also passing mucus for her stool.[name] 's # is [phone].

## 2023-05-31 ENCOUNTER — Telehealth: Payer: Self-pay

## 2023-05-31 MED ORDER — BACLOFEN 20 MG PO TABS: ORAL_TABLET | ORAL | 3 refills | Status: DC

## 2023-05-31 NOTE — Telephone Encounter (Signed)
Fax from pharmacy states new rx. Patient states Baclofen is taken 1-1/2 tabs in am and evening and 3 tabs at bedtime. Need new RX to reflect the directions.

## 2023-05-31 NOTE — Telephone Encounter (Signed)
So not sure this is blood  if positive the  test becomes  positive( blue)  immediately .  ? Eating any foods that are dark red ?  Beets , red gelatin type  food coloring

## 2023-06-01 NOTE — Telephone Encounter (Signed)
Pt is out of state and stated she will get with her dr in [place] and will check with dr.Panosh when she get back.

## 2023-06-01 NOTE — Telephone Encounter (Signed)
If she is passing blood she should see  provider in [place] and not wait.

## 2023-06-01 NOTE — Telephone Encounter (Signed)
Contacted pt and relay message to her. Pt states she received the message and will pursue.

## 2023-06-04 DIAGNOSIS — S7291XA Unspecified fracture of right femur, initial encounter for closed fracture: Secondary | ICD-10-CM | POA: Diagnosis not present

## 2023-06-04 DIAGNOSIS — T1490XA Injury, unspecified, initial encounter: Secondary | ICD-10-CM | POA: Diagnosis not present

## 2023-06-04 DIAGNOSIS — K922 Gastrointestinal hemorrhage, unspecified: Secondary | ICD-10-CM | POA: Diagnosis not present

## 2023-06-04 DIAGNOSIS — M4185 Other forms of scoliosis, thoracolumbar region: Secondary | ICD-10-CM | POA: Diagnosis not present

## 2023-06-04 DIAGNOSIS — N3289 Other specified disorders of bladder: Secondary | ICD-10-CM | POA: Diagnosis not present

## 2023-06-04 DIAGNOSIS — D696 Thrombocytopenia, unspecified: Secondary | ICD-10-CM | POA: Diagnosis not present

## 2023-06-04 DIAGNOSIS — G8220 Paraplegia, unspecified: Secondary | ICD-10-CM | POA: Diagnosis not present

## 2023-06-04 DIAGNOSIS — Z043 Encounter for examination and observation following other accident: Secondary | ICD-10-CM | POA: Diagnosis not present

## 2023-06-04 DIAGNOSIS — Z01810 Encounter for preprocedural cardiovascular examination: Secondary | ICD-10-CM | POA: Diagnosis not present

## 2023-06-04 DIAGNOSIS — E785 Hyperlipidemia, unspecified: Secondary | ICD-10-CM | POA: Diagnosis not present

## 2023-06-04 DIAGNOSIS — M810 Age-related osteoporosis without current pathological fracture: Secondary | ICD-10-CM | POA: Diagnosis not present

## 2023-06-04 DIAGNOSIS — Z743 Need for continuous supervision: Secondary | ICD-10-CM | POA: Diagnosis not present

## 2023-06-04 DIAGNOSIS — S72452A Displaced supracondylar fracture without intracondylar extension of lower end of left femur, initial encounter for closed fracture: Secondary | ICD-10-CM | POA: Diagnosis not present

## 2023-06-04 DIAGNOSIS — M7989 Other specified soft tissue disorders: Secondary | ICD-10-CM | POA: Diagnosis not present

## 2023-06-04 DIAGNOSIS — K449 Diaphragmatic hernia without obstruction or gangrene: Secondary | ICD-10-CM | POA: Diagnosis not present

## 2023-06-04 DIAGNOSIS — T83510A Infection and inflammatory reaction due to cystostomy catheter, initial encounter: Secondary | ICD-10-CM | POA: Diagnosis not present

## 2023-06-04 DIAGNOSIS — N3000 Acute cystitis without hematuria: Secondary | ICD-10-CM | POA: Diagnosis not present

## 2023-06-04 DIAGNOSIS — S7292XA Unspecified fracture of left femur, initial encounter for closed fracture: Secondary | ICD-10-CM | POA: Diagnosis not present

## 2023-06-04 DIAGNOSIS — D649 Anemia, unspecified: Secondary | ICD-10-CM | POA: Diagnosis not present

## 2023-06-04 DIAGNOSIS — S72301A Unspecified fracture of shaft of right femur, initial encounter for closed fracture: Secondary | ICD-10-CM | POA: Diagnosis not present

## 2023-06-04 DIAGNOSIS — S72451A Displaced supracondylar fracture without intracondylar extension of lower end of right femur, initial encounter for closed fracture: Secondary | ICD-10-CM | POA: Diagnosis not present

## 2023-06-04 DIAGNOSIS — K2970 Gastritis, unspecified, without bleeding: Secondary | ICD-10-CM | POA: Diagnosis not present

## 2023-06-04 DIAGNOSIS — D509 Iron deficiency anemia, unspecified: Secondary | ICD-10-CM | POA: Diagnosis not present

## 2023-06-04 DIAGNOSIS — T83511A Infection and inflammatory reaction due to indwelling urethral catheter, initial encounter: Secondary | ICD-10-CM | POA: Diagnosis not present

## 2023-06-04 DIAGNOSIS — E46 Unspecified protein-calorie malnutrition: Secondary | ICD-10-CM | POA: Diagnosis not present

## 2023-06-13 ENCOUNTER — Telehealth: Payer: Self-pay
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
## 2023-06-16 DIAGNOSIS — R059 Cough, unspecified: Secondary | ICD-10-CM | POA: Diagnosis not present

## 2023-06-16 DIAGNOSIS — R5381 Other malaise: Secondary | ICD-10-CM | POA: Diagnosis not present

## 2023-06-16 DIAGNOSIS — D649 Anemia, unspecified: Secondary | ICD-10-CM | POA: Diagnosis not present

## 2023-06-16 DIAGNOSIS — R079 Chest pain, unspecified: Secondary | ICD-10-CM | POA: Diagnosis not present

## 2023-06-16 DIAGNOSIS — R5383 Other fatigue: Secondary | ICD-10-CM | POA: Diagnosis not present

## 2023-06-16 DIAGNOSIS — Z20822 Contact with and (suspected) exposure to covid-19: Secondary | ICD-10-CM | POA: Diagnosis not present

## 2023-06-16 DIAGNOSIS — N390 Urinary tract infection, site not specified: Secondary | ICD-10-CM | POA: Diagnosis not present

## 2023-06-17 DIAGNOSIS — M80051D Age-related osteoporosis with current pathological fracture, right femur, subsequent encounter for fracture with routine healing: Secondary | ICD-10-CM | POA: Diagnosis not present

## 2023-06-17 DIAGNOSIS — N390 Urinary tract infection, site not specified: Secondary | ICD-10-CM | POA: Diagnosis not present

## 2023-06-17 DIAGNOSIS — D539 Nutritional anemia, unspecified: Secondary | ICD-10-CM | POA: Diagnosis not present

## 2023-06-17 DIAGNOSIS — B952 Enterococcus as the cause of diseases classified elsewhere: Secondary | ICD-10-CM | POA: Diagnosis not present

## 2023-06-17 DIAGNOSIS — E46 Unspecified protein-calorie malnutrition: Secondary | ICD-10-CM | POA: Diagnosis not present

## 2023-06-17 DIAGNOSIS — M80052D Age-related osteoporosis with current pathological fracture, left femur, subsequent encounter for fracture with routine healing: Secondary | ICD-10-CM | POA: Diagnosis not present

## 2023-06-17 DIAGNOSIS — S14109D Unspecified injury at unspecified level of cervical spinal cord, subsequent encounter: Secondary | ICD-10-CM | POA: Diagnosis not present

## 2023-06-17 DIAGNOSIS — Z435 Encounter for attention to cystostomy: Secondary | ICD-10-CM | POA: Diagnosis not present

## 2023-06-17 DIAGNOSIS — N319 Neuromuscular dysfunction of bladder, unspecified: Secondary | ICD-10-CM | POA: Diagnosis not present

## 2023-06-20 DIAGNOSIS — N319 Neuromuscular dysfunction of bladder, unspecified: Secondary | ICD-10-CM | POA: Diagnosis not present

## 2023-06-20 DIAGNOSIS — B952 Enterococcus as the cause of diseases classified elsewhere: Secondary | ICD-10-CM | POA: Diagnosis not present

## 2023-06-20 DIAGNOSIS — S14109D Unspecified injury at unspecified level of cervical spinal cord, subsequent encounter: Secondary | ICD-10-CM | POA: Diagnosis not present

## 2023-06-20 DIAGNOSIS — D539 Nutritional anemia, unspecified: Secondary | ICD-10-CM | POA: Diagnosis not present

## 2023-06-20 DIAGNOSIS — M80052D Age-related osteoporosis with current pathological fracture, left femur, subsequent encounter for fracture with routine healing: Secondary | ICD-10-CM | POA: Diagnosis not present

## 2023-06-20 DIAGNOSIS — E46 Unspecified protein-calorie malnutrition: Secondary | ICD-10-CM | POA: Diagnosis not present

## 2023-06-20 DIAGNOSIS — Z435 Encounter for attention to cystostomy: Secondary | ICD-10-CM | POA: Diagnosis not present

## 2023-06-20 DIAGNOSIS — N390 Urinary tract infection, site not specified: Secondary | ICD-10-CM | POA: Diagnosis not present

## 2023-06-20 DIAGNOSIS — M80051D Age-related osteoporosis with current pathological fracture, right femur, subsequent encounter for fracture with routine healing: Secondary | ICD-10-CM | POA: Diagnosis not present

## 2023-06-21 DIAGNOSIS — M79604 Pain in right leg: Secondary | ICD-10-CM | POA: Diagnosis not present

## 2023-06-21 DIAGNOSIS — S7291XD Unspecified fracture of right femur, subsequent encounter for closed fracture with routine healing: Secondary | ICD-10-CM | POA: Diagnosis not present

## 2023-06-21 DIAGNOSIS — M79605 Pain in left leg: Secondary | ICD-10-CM | POA: Diagnosis not present

## 2023-06-22 ENCOUNTER — Telehealth: Payer: Medicare PPO | Admitting: "Endocrinology

## 2023-06-22 DIAGNOSIS — N319 Neuromuscular dysfunction of bladder, unspecified: Secondary | ICD-10-CM | POA: Diagnosis not present

## 2023-06-22 DIAGNOSIS — M80052D Age-related osteoporosis with current pathological fracture, left femur, subsequent encounter for fracture with routine healing: Secondary | ICD-10-CM | POA: Diagnosis not present

## 2023-06-22 DIAGNOSIS — N390 Urinary tract infection, site not specified: Secondary | ICD-10-CM | POA: Diagnosis not present

## 2023-06-22 DIAGNOSIS — M80051D Age-related osteoporosis with current pathological fracture, right femur, subsequent encounter for fracture with routine healing: Secondary | ICD-10-CM | POA: Diagnosis not present

## 2023-06-22 DIAGNOSIS — Z435 Encounter for attention to cystostomy: Secondary | ICD-10-CM | POA: Diagnosis not present

## 2023-06-22 DIAGNOSIS — S14109D Unspecified injury at unspecified level of cervical spinal cord, subsequent encounter: Secondary | ICD-10-CM | POA: Diagnosis not present

## 2023-06-22 DIAGNOSIS — E46 Unspecified protein-calorie malnutrition: Secondary | ICD-10-CM | POA: Diagnosis not present

## 2023-06-22 DIAGNOSIS — B952 Enterococcus as the cause of diseases classified elsewhere: Secondary | ICD-10-CM | POA: Diagnosis not present

## 2023-06-22 DIAGNOSIS — D539 Nutritional anemia, unspecified: Secondary | ICD-10-CM | POA: Diagnosis not present

## 2023-06-24 DIAGNOSIS — N319 Neuromuscular dysfunction of bladder, unspecified: Secondary | ICD-10-CM | POA: Diagnosis not present

## 2023-06-24 DIAGNOSIS — E46 Unspecified protein-calorie malnutrition: Secondary | ICD-10-CM | POA: Diagnosis not present

## 2023-06-24 DIAGNOSIS — N390 Urinary tract infection, site not specified: Secondary | ICD-10-CM | POA: Diagnosis not present

## 2023-06-24 DIAGNOSIS — M80052D Age-related osteoporosis with current pathological fracture, left femur, subsequent encounter for fracture with routine healing: Secondary | ICD-10-CM | POA: Diagnosis not present

## 2023-06-24 DIAGNOSIS — M80051D Age-related osteoporosis with current pathological fracture, right femur, subsequent encounter for fracture with routine healing: Secondary | ICD-10-CM | POA: Diagnosis not present

## 2023-06-24 DIAGNOSIS — B952 Enterococcus as the cause of diseases classified elsewhere: Secondary | ICD-10-CM | POA: Diagnosis not present

## 2023-06-24 DIAGNOSIS — D539 Nutritional anemia, unspecified: Secondary | ICD-10-CM | POA: Diagnosis not present

## 2023-06-24 DIAGNOSIS — Z435 Encounter for attention to cystostomy: Secondary | ICD-10-CM | POA: Diagnosis not present

## 2023-06-24 DIAGNOSIS — S14109D Unspecified injury at unspecified level of cervical spinal cord, subsequent encounter: Secondary | ICD-10-CM | POA: Diagnosis not present

## 2023-06-27 DIAGNOSIS — E46 Unspecified protein-calorie malnutrition: Secondary | ICD-10-CM | POA: Diagnosis not present

## 2023-06-27 DIAGNOSIS — M80052D Age-related osteoporosis with current pathological fracture, left femur, subsequent encounter for fracture with routine healing: Secondary | ICD-10-CM | POA: Diagnosis not present

## 2023-06-27 DIAGNOSIS — B952 Enterococcus as the cause of diseases classified elsewhere: Secondary | ICD-10-CM | POA: Diagnosis not present

## 2023-06-27 DIAGNOSIS — S14109D Unspecified injury at unspecified level of cervical spinal cord, subsequent encounter: Secondary | ICD-10-CM | POA: Diagnosis not present

## 2023-06-27 DIAGNOSIS — N390 Urinary tract infection, site not specified: Secondary | ICD-10-CM | POA: Diagnosis not present

## 2023-06-27 DIAGNOSIS — Z435 Encounter for attention to cystostomy: Secondary | ICD-10-CM | POA: Diagnosis not present

## 2023-06-27 DIAGNOSIS — D539 Nutritional anemia, unspecified: Secondary | ICD-10-CM | POA: Diagnosis not present

## 2023-06-27 DIAGNOSIS — N319 Neuromuscular dysfunction of bladder, unspecified: Secondary | ICD-10-CM | POA: Diagnosis not present

## 2023-06-27 DIAGNOSIS — M80051D Age-related osteoporosis with current pathological fracture, right femur, subsequent encounter for fracture with routine healing: Secondary | ICD-10-CM | POA: Diagnosis not present

## 2023-06-30 DIAGNOSIS — M80051D Age-related osteoporosis with current pathological fracture, right femur, subsequent encounter for fracture with routine healing: Secondary | ICD-10-CM | POA: Diagnosis not present

## 2023-06-30 DIAGNOSIS — M80052D Age-related osteoporosis with current pathological fracture, left femur, subsequent encounter for fracture with routine healing: Secondary | ICD-10-CM | POA: Diagnosis not present

## 2023-06-30 DIAGNOSIS — D539 Nutritional anemia, unspecified: Secondary | ICD-10-CM | POA: Diagnosis not present

## 2023-06-30 DIAGNOSIS — Z435 Encounter for attention to cystostomy: Secondary | ICD-10-CM | POA: Diagnosis not present

## 2023-06-30 DIAGNOSIS — E46 Unspecified protein-calorie malnutrition: Secondary | ICD-10-CM | POA: Diagnosis not present

## 2023-06-30 DIAGNOSIS — B952 Enterococcus as the cause of diseases classified elsewhere: Secondary | ICD-10-CM | POA: Diagnosis not present

## 2023-06-30 DIAGNOSIS — N390 Urinary tract infection, site not specified: Secondary | ICD-10-CM | POA: Diagnosis not present

## 2023-06-30 DIAGNOSIS — S14109D Unspecified injury at unspecified level of cervical spinal cord, subsequent encounter: Secondary | ICD-10-CM | POA: Diagnosis not present

## 2023-06-30 DIAGNOSIS — N319 Neuromuscular dysfunction of bladder, unspecified: Secondary | ICD-10-CM | POA: Diagnosis not present

## 2023-07-05 ENCOUNTER — Encounter: Payer: Self-pay | Admitting: Physical Medicine and Rehabilitation

## 2023-07-05 DIAGNOSIS — B952 Enterococcus as the cause of diseases classified elsewhere: Secondary | ICD-10-CM | POA: Diagnosis not present

## 2023-07-05 DIAGNOSIS — N319 Neuromuscular dysfunction of bladder, unspecified: Secondary | ICD-10-CM | POA: Diagnosis not present

## 2023-07-05 DIAGNOSIS — N390 Urinary tract infection, site not specified: Secondary | ICD-10-CM | POA: Diagnosis not present

## 2023-07-05 DIAGNOSIS — E46 Unspecified protein-calorie malnutrition: Secondary | ICD-10-CM | POA: Diagnosis not present

## 2023-07-05 DIAGNOSIS — M80052D Age-related osteoporosis with current pathological fracture, left femur, subsequent encounter for fracture with routine healing: Secondary | ICD-10-CM | POA: Diagnosis not present

## 2023-07-05 DIAGNOSIS — D539 Nutritional anemia, unspecified: Secondary | ICD-10-CM | POA: Diagnosis not present

## 2023-07-05 DIAGNOSIS — M80051D Age-related osteoporosis with current pathological fracture, right femur, subsequent encounter for fracture with routine healing: Secondary | ICD-10-CM | POA: Diagnosis not present

## 2023-07-05 DIAGNOSIS — Z435 Encounter for attention to cystostomy: Secondary | ICD-10-CM | POA: Diagnosis not present

## 2023-07-05 DIAGNOSIS — S14109D Unspecified injury at unspecified level of cervical spinal cord, subsequent encounter: Secondary | ICD-10-CM | POA: Diagnosis not present

## 2023-07-07 DIAGNOSIS — E46 Unspecified protein-calorie malnutrition: Secondary | ICD-10-CM | POA: Diagnosis not present

## 2023-07-07 DIAGNOSIS — B952 Enterococcus as the cause of diseases classified elsewhere: Secondary | ICD-10-CM | POA: Diagnosis not present

## 2023-07-07 DIAGNOSIS — N390 Urinary tract infection, site not specified: Secondary | ICD-10-CM | POA: Diagnosis not present

## 2023-07-07 DIAGNOSIS — N319 Neuromuscular dysfunction of bladder, unspecified: Secondary | ICD-10-CM | POA: Diagnosis not present

## 2023-07-07 DIAGNOSIS — M80051D Age-related osteoporosis with current pathological fracture, right femur, subsequent encounter for fracture with routine healing: Secondary | ICD-10-CM | POA: Diagnosis not present

## 2023-07-07 DIAGNOSIS — Z435 Encounter for attention to cystostomy: Secondary | ICD-10-CM | POA: Diagnosis not present

## 2023-07-07 DIAGNOSIS — S14109D Unspecified injury at unspecified level of cervical spinal cord, subsequent encounter: Secondary | ICD-10-CM | POA: Diagnosis not present

## 2023-07-07 DIAGNOSIS — D539 Nutritional anemia, unspecified: Secondary | ICD-10-CM | POA: Diagnosis not present

## 2023-07-07 DIAGNOSIS — M80052D Age-related osteoporosis with current pathological fracture, left femur, subsequent encounter for fracture with routine healing: Secondary | ICD-10-CM | POA: Diagnosis not present

## 2023-07-07 MED ORDER — BACLOFEN 20 MG PO TABS: ORAL_TABLET | ORAL | 3 refills | Status: DC

## 2023-07-07 MED ORDER — TIZANIDINE HCL 4 MG PO TABS: 2.0000 mg | ORAL_TABLET | Freq: Two times a day (BID) | ORAL | 5 refills | Status: DC | PRN

## 2023-07-07 NOTE — Telephone Encounter (Signed)
  Increase baclofen to 160 mg/day-   Can spread out how you want, but no more than 60 mg at a time.   Have increased the dose at pharmacy for now.  After 4-8 weeks decrease by 20 mg/day and see how it goes for at least 1 week, before you go back to prior dose.  If you have increased spasticity- then go back up on the dose.     Wil add Zanaflex/tizanidine 2-4 mg up to 2x/day as needed- for additional spasticity- to take WITH baclofen.

## 2023-07-08 ENCOUNTER — Telehealth: Payer: Self-pay | Admitting: Physical Medicine and Rehabilitation

## 2023-07-08 MED ORDER — BACLOFEN 20 MG PO TABS: ORAL_TABLET | ORAL | 3 refills | Status: DC

## 2023-07-08 NOTE — Telephone Encounter (Signed)
Pt needed Rx modified so insurance and pharmacy would fill- sent in for her- thanks- ML

## 2023-07-12 ENCOUNTER — Other Ambulatory Visit: Payer: Self-pay | Admitting: Internal Medicine

## 2023-07-13 DIAGNOSIS — M80052D Age-related osteoporosis with current pathological fracture, left femur, subsequent encounter for fracture with routine healing: Secondary | ICD-10-CM | POA: Diagnosis not present

## 2023-07-13 DIAGNOSIS — S72451D Displaced supracondylar fracture without intracondylar extension of lower end of right femur, subsequent encounter for closed fracture with routine healing: Secondary | ICD-10-CM | POA: Diagnosis not present

## 2023-07-13 DIAGNOSIS — B952 Enterococcus as the cause of diseases classified elsewhere: Secondary | ICD-10-CM | POA: Diagnosis not present

## 2023-07-13 DIAGNOSIS — S72401A Unspecified fracture of lower end of right femur, initial encounter for closed fracture: Secondary | ICD-10-CM | POA: Diagnosis not present

## 2023-07-13 DIAGNOSIS — S72402A Unspecified fracture of lower end of left femur, initial encounter for closed fracture: Secondary | ICD-10-CM | POA: Diagnosis not present

## 2023-07-13 DIAGNOSIS — D539 Nutritional anemia, unspecified: Secondary | ICD-10-CM | POA: Diagnosis not present

## 2023-07-13 DIAGNOSIS — N390 Urinary tract infection, site not specified: Secondary | ICD-10-CM | POA: Diagnosis not present

## 2023-07-13 DIAGNOSIS — S72452D Displaced supracondylar fracture without intracondylar extension of lower end of left femur, subsequent encounter for closed fracture with routine healing: Secondary | ICD-10-CM | POA: Diagnosis not present

## 2023-07-13 DIAGNOSIS — S14109D Unspecified injury at unspecified level of cervical spinal cord, subsequent encounter: Secondary | ICD-10-CM | POA: Diagnosis not present

## 2023-07-13 DIAGNOSIS — N319 Neuromuscular dysfunction of bladder, unspecified: Secondary | ICD-10-CM | POA: Diagnosis not present

## 2023-07-13 DIAGNOSIS — Z435 Encounter for attention to cystostomy: Secondary | ICD-10-CM | POA: Diagnosis not present

## 2023-07-13 DIAGNOSIS — E46 Unspecified protein-calorie malnutrition: Secondary | ICD-10-CM | POA: Diagnosis not present

## 2023-07-13 DIAGNOSIS — M80051D Age-related osteoporosis with current pathological fracture, right femur, subsequent encounter for fracture with routine healing: Secondary | ICD-10-CM | POA: Diagnosis not present

## 2023-07-14 DIAGNOSIS — Z435 Encounter for attention to cystostomy: Secondary | ICD-10-CM | POA: Diagnosis not present

## 2023-07-14 DIAGNOSIS — E46 Unspecified protein-calorie malnutrition: Secondary | ICD-10-CM | POA: Diagnosis not present

## 2023-07-14 DIAGNOSIS — N390 Urinary tract infection, site not specified: Secondary | ICD-10-CM | POA: Diagnosis not present

## 2023-07-14 DIAGNOSIS — N319 Neuromuscular dysfunction of bladder, unspecified: Secondary | ICD-10-CM | POA: Diagnosis not present

## 2023-07-14 DIAGNOSIS — B952 Enterococcus as the cause of diseases classified elsewhere: Secondary | ICD-10-CM | POA: Diagnosis not present

## 2023-07-14 DIAGNOSIS — S14109D Unspecified injury at unspecified level of cervical spinal cord, subsequent encounter: Secondary | ICD-10-CM | POA: Diagnosis not present

## 2023-07-14 DIAGNOSIS — D539 Nutritional anemia, unspecified: Secondary | ICD-10-CM | POA: Diagnosis not present

## 2023-07-14 DIAGNOSIS — M80052D Age-related osteoporosis with current pathological fracture, left femur, subsequent encounter for fracture with routine healing: Secondary | ICD-10-CM | POA: Diagnosis not present

## 2023-07-14 DIAGNOSIS — M80051D Age-related osteoporosis with current pathological fracture, right femur, subsequent encounter for fracture with routine healing: Secondary | ICD-10-CM | POA: Diagnosis not present

## 2023-07-18 DIAGNOSIS — S7291XD Unspecified fracture of right femur, subsequent encounter for closed fracture with routine healing: Secondary | ICD-10-CM | POA: Diagnosis not present

## 2023-07-18 DIAGNOSIS — R338 Other retention of urine: Secondary | ICD-10-CM | POA: Diagnosis not present

## 2023-07-18 DIAGNOSIS — G8254 Quadriplegia, C5-C7 incomplete: Secondary | ICD-10-CM | POA: Diagnosis not present

## 2023-07-18 DIAGNOSIS — D509 Iron deficiency anemia, unspecified: Secondary | ICD-10-CM | POA: Diagnosis not present

## 2023-07-18 DIAGNOSIS — N319 Neuromuscular dysfunction of bladder, unspecified: Secondary | ICD-10-CM | POA: Diagnosis not present

## 2023-07-27 DIAGNOSIS — M79662 Pain in left lower leg: Secondary | ICD-10-CM | POA: Diagnosis not present

## 2023-07-29 ENCOUNTER — Encounter: Payer: Self-pay | Admitting: Internal Medicine

## 2023-07-29 MED ORDER — BACLOFEN 20 MG PO TABS: ORAL_TABLET | ORAL | 3 refills | Status: DC

## 2023-07-30 ENCOUNTER — Other Ambulatory Visit: Payer: Self-pay | Admitting: Family

## 2023-07-30 MED ORDER — FESOTERODINE FUMARATE ER 8 MG PO TB24: 8.0000 mg | ORAL_TABLET | Freq: Every day | ORAL | 1 refills | Status: DC

## 2023-08-02 DIAGNOSIS — L89322 Pressure ulcer of left buttock, stage 2: Secondary | ICD-10-CM | POA: Diagnosis not present

## 2023-08-02 DIAGNOSIS — L89323 Pressure ulcer of left buttock, stage 3: Secondary | ICD-10-CM | POA: Diagnosis not present

## 2023-08-03 DIAGNOSIS — S31809A Unspecified open wound of unspecified buttock, initial encounter: Secondary | ICD-10-CM | POA: Diagnosis not present

## 2023-08-09 ENCOUNTER — Encounter: Payer: Self-pay | Admitting: "Endocrinology

## 2023-08-09 DIAGNOSIS — L89323 Pressure ulcer of left buttock, stage 3: Secondary | ICD-10-CM | POA: Diagnosis not present

## 2023-08-09 DIAGNOSIS — L89322 Pressure ulcer of left buttock, stage 2: Secondary | ICD-10-CM | POA: Diagnosis not present

## 2023-08-15 ENCOUNTER — Ambulatory Visit: Payer: Medicare PPO | Admitting: Infectious Diseases

## 2023-08-15 DIAGNOSIS — R278 Other lack of coordination: Secondary | ICD-10-CM | POA: Diagnosis not present

## 2023-08-15 DIAGNOSIS — R609 Edema, unspecified: Secondary | ICD-10-CM | POA: Diagnosis not present

## 2023-08-15 DIAGNOSIS — M6281 Muscle weakness (generalized): Secondary | ICD-10-CM | POA: Diagnosis not present

## 2023-08-15 DIAGNOSIS — G8250 Quadriplegia, unspecified: Secondary | ICD-10-CM | POA: Diagnosis not present

## 2023-08-16 DIAGNOSIS — L89322 Pressure ulcer of left buttock, stage 2: Secondary | ICD-10-CM | POA: Diagnosis not present

## 2023-08-16 DIAGNOSIS — L89323 Pressure ulcer of left buttock, stage 3: Secondary | ICD-10-CM | POA: Diagnosis not present

## 2023-08-17 ENCOUNTER — Ambulatory Visit: Payer: Medicare PPO | Admitting: Internal Medicine

## 2023-08-17 DIAGNOSIS — R278 Other lack of coordination: Secondary | ICD-10-CM | POA: Diagnosis not present

## 2023-08-17 DIAGNOSIS — M6281 Muscle weakness (generalized): Secondary | ICD-10-CM | POA: Diagnosis not present

## 2023-08-17 DIAGNOSIS — R609 Edema, unspecified: Secondary | ICD-10-CM | POA: Diagnosis not present

## 2023-08-17 DIAGNOSIS — G8250 Quadriplegia, unspecified: Secondary | ICD-10-CM | POA: Diagnosis not present

## 2023-08-17 DIAGNOSIS — S72451D Displaced supracondylar fracture without intracondylar extension of lower end of right femur, subsequent encounter for closed fracture with routine healing: Secondary | ICD-10-CM | POA: Diagnosis not present

## 2023-08-17 DIAGNOSIS — S72452D Displaced supracondylar fracture without intracondylar extension of lower end of left femur, subsequent encounter for closed fracture with routine healing: Secondary | ICD-10-CM | POA: Diagnosis not present

## 2023-08-18 ENCOUNTER — Other Ambulatory Visit: Payer: Self-pay | Admitting: Infectious Diseases

## 2023-08-18 NOTE — Telephone Encounter (Signed)
 90 day supply dispensed 07/07/23. Additional refills to be discussed at 3/18 appt.

## 2023-08-22 DIAGNOSIS — L89323 Pressure ulcer of left buttock, stage 3: Secondary | ICD-10-CM | POA: Diagnosis not present

## 2023-08-22 DIAGNOSIS — R609 Edema, unspecified: Secondary | ICD-10-CM | POA: Diagnosis not present

## 2023-08-22 DIAGNOSIS — G8250 Quadriplegia, unspecified: Secondary | ICD-10-CM | POA: Diagnosis not present

## 2023-08-22 DIAGNOSIS — R278 Other lack of coordination: Secondary | ICD-10-CM | POA: Diagnosis not present

## 2023-08-22 DIAGNOSIS — M6281 Muscle weakness (generalized): Secondary | ICD-10-CM | POA: Diagnosis not present

## 2023-08-22 DIAGNOSIS — L89322 Pressure ulcer of left buttock, stage 2: Secondary | ICD-10-CM | POA: Diagnosis not present

## 2023-08-28 ENCOUNTER — Encounter: Payer: Self-pay | Admitting: Physical Medicine and Rehabilitation

## 2023-08-28 DIAGNOSIS — G8254 Quadriplegia, C5-C7 incomplete: Secondary | ICD-10-CM

## 2023-08-30 ENCOUNTER — Telehealth (INDEPENDENT_AMBULATORY_CARE_PROVIDER_SITE_OTHER): Payer: Medicare PPO | Admitting: Infectious Diseases

## 2023-08-30 ENCOUNTER — Other Ambulatory Visit: Payer: Self-pay

## 2023-08-30 ENCOUNTER — Encounter: Payer: Self-pay | Admitting: Infectious Diseases

## 2023-08-30 DIAGNOSIS — Z5181 Encounter for therapeutic drug level monitoring: Secondary | ICD-10-CM

## 2023-08-30 DIAGNOSIS — R32 Unspecified urinary incontinence: Secondary | ICD-10-CM

## 2023-08-30 DIAGNOSIS — N390 Urinary tract infection, site not specified: Secondary | ICD-10-CM

## 2023-08-30 MED ORDER — METHENAMINE HIPPURATE 1 G PO TABS: 1.0000 g | ORAL_TABLET | Freq: Two times a day (BID) | ORAL | 5 refills | Status: DC

## 2023-08-30 MED ORDER — ASCORBIC ACID 1000 MG PO TABS: 1000.0000 mg | ORAL_TABLET | Freq: Four times a day (QID) | ORAL | 5 refills | Status: AC

## 2023-08-30 NOTE — Progress Notes (Signed)
 Virtual Visit via Video Note  I connected withNAME@ on 08/30/23 at  2:45 PM EDT by a video enabled telemedicine application and verified that I am speaking with the correct person using two identifiers.  Location: Patient: Home  Provider: [facility]   I discussed the limitations of evaluation and management by telemedicine and the availability of in person appointments. The patient expressed understanding and agreed to proceed.  [facility]  Patient Active Problem List   Diagnosis Date Noted   Buttock wound, left, subsequent encounter 03/16/2023   Bronchiectasis with acute exacerbation (HCC) 03/15/2023   Buttock wound, left, initial encounter 11/02/2022   Orthostatic hypotension 08/13/2022   Neurogenic bowel 05/03/2022   Spasticity 05/03/2022   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/03/2022   Nerve pain 05/03/2022   Medication monitoring encounter 01/08/2022   Neurogenic bladder 10/11/2021   Urinary incontinence 10/11/2021   ESBL (extended spectrum beta-lactamase) producing bacteria infection 10/09/2021   Recurrent UTI 10/09/2021   Quadriplegia, C5-C7 incomplete (HCC) 01/16/2021   History of spinal fracture 01/16/2021   Suprapubic catheter (HCC) 01/16/2021   Encounter for routine gynecological examination 09/28/2013   Onychomycosis 09/28/2013   Foot deformity, acquired 03/26/2012   Encounter for preventive health examination 12/25/2010   ROSACEA 08/25/2009   Disturbance in sleep behavior 03/11/2008   SKIN CANCER, HX OF 03/11/2008   DYSURIA, HX OF 03/11/2008   Hyperlipidemia 02/10/2007   CERVICALGIA 02/10/2007   Current Outpatient Medications on File Prior to Visit  Medication Sig Dispense Refill   atorvastatin (LIPITOR) 20 MG tablet TAKE 1 TABLET BY MOUTH EVERY DAY 90 tablet 1   baclofen (LIORESAL) 20 MG tablet Increasing baclofen to 40 mg 4x/day- after leg fractures- for spasticity- 720 tablet 3   bisacodyl (DULCOLAX) 10 MG suppository Place 10 mg rectally as  needed for moderate constipation. Insert one suppository per rectum with each bowel program procedure.     cholecalciferol (VITAMIN D3) 25 MCG (1000 UNIT) tablet Take 1,000 Units by mouth daily. 2000u     [employer] COENZYME Q-10 100 MG capsule Take 100 mg by mouth daily.      Docusate Sodium (DSS) 100 MG CAPS Take by mouth.     famotidine (PEPCID) 20 MG tablet Take 20 mg by mouth 2 (two) times daily.     fesoterodine (TOVIAZ) 8 MG TB24 tablet Take 1 tablet (8 mg total) by mouth daily. 90 tablet 1   gabapentin (NEURONTIN) 600 MG tablet TAKE 2 TABLETS (1,200 MG TOTAL) BY MOUTH 3 (THREE) TIMES DAILY. 270 tablet 1   metroNIDAZOLE (METROGEL) 1 % gel APPLY TOPICALLY EVERY DAY 60 g 10   Multiple Vitamins-Minerals (CENTRUM SILVER ULTRA WOMENS PO) Take by mouth.     mupirocin ointment (BACTROBAN) 2 % Apply 1 Application topically 2 (two) times daily. 30 g 2   MYRBETRIQ 50 MG TB24 tablet TAKE 1 TABLET BY MOUTH EVERY DAY 30 tablet 10   naproxen sodium (ALEVE) 220 MG tablet Take 220 mg by mouth. Tablet p.o per package directions as needed for pain     nortriptyline (PAMELOR) 50 MG capsule Take 1 capsule (50 mg total) by mouth at bedtime. 90 capsule 1   SENNA CO by Combination route. Sennosides 8.6mg  tab p.o per package directions daily as needed to promote bowel movement.     Teriparatide (FORTEO) 600 MCG/2.4ML SOPN Inject 20 mcg into the skin daily in the afternoon. 2.4 mL 3   tiZANidine (ZANAFLEX) 4 MG tablet Take 0.5-1 tablets (2-4 mg total) by mouth 2 (two)  times daily as needed for muscle spasms. To take with baclofen- for increasing spasticity in SCI patient- needs BOTH meds- 60 tablet 5   UNABLE TO FIND Med Name: Saline Enema Per rectum per package directions as needed for relief of constipation.     zolpidem (AMBIEN) 5 MG tablet Take 0.5 tablets (2.5 mg total) by mouth at bedtime as needed for sleep. 45 tablet 0   No current facility-administered medications on file prior to visit.   History of Present  Illness: 73 YO Female with h/o Incomplete quadriplegia with neurogenic bladder s/p SPC with urinary incontinence who is here for fu in the setting of recurrent UTI. 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 Denies any UTIs so far. She has an aide who helps with [facility] exchange and follows up with Urology as needed.    03/15/23 Accompanied by ger caretaker. Admitted 9/22 -9/25 for CAP/Bronchiectasis. Received Iv meropenem/azithromycin in the hospital which was switched to PO augmentin on discharge to complete 7 days. Last day of po augmentin is tomorrow. Shortness of breath and chest pain has improved but still has some dry cough. She feels significantly better. Denies fevers, chills. Not taking methenamine due to being on abtx since hospitalized and plan to resume tomorrow evening, She is taking Vit c however. No UTIs since last seen. [facility] for inpatient intensive PT and vefy pleased with the therapy. No refills needed and she knows to call clinic in case she runs out of pills. Reports her left buttock wound has healed/closed.   08/30/23 She had an episode of E faecalis UTI in Nov 2024 that was treated with a course of PO amoxicillin. Denies having any other episodes of UTI. She reports compliance with Vitamin C and methenamine without any concerns.  She reports having surgery for her broken bilateral femurs before dec 2024 and was followed by Orthopedics and has recovered well. No GU concerns and needs refills. She has a PCP appointment in 2 weeks and wants to do CBC and CMP that day.   ROS - all systems reviewed including GU with pertinent positives and negatives as listed above.  Past Medical History:  Diagnosis Date   CERVICAL POLYP 03/11/2008   Qualifier: Diagnosis of  By: [name] MD, [name]    Colon polyps 2005   on colonscopy [name]   Fibroid 2004   Per Dr. [name]   History of  shingles    face and mouth   Hx of skin cancer, basal cell    Rosacea    Sciatica of left side 09/28/2013   Scoliosis    noted on mri done for back pain   Past Surgical History:  Procedure Laterality Date   BUNIONECTOMY      Social History   Tobacco Use   Smoking status: Never   Smokeless tobacco: Never  Vaping Use   Vaping status: Never Used  Substance Use Topics   Alcohol use: Not Currently    Alcohol/week: 7.0 standard drinks of alcohol    Types: 7 Glasses of wine per week   Drug use: Yes    Comment: wine at night    Family History  Problem Relation Age of Onset   Hypertension Father    Osteoporosis Other    Breast cancer Neg Hx     No Known Allergies  Health Maintenance  Topic Date Due   Medicare Annual Wellness (  AWV)  03/03/2023   COVID-19 Vaccine (12 - Moderna risk 2024-25 season) 10/16/2023   Fecal DNA (Cologuard)  10/11/2024   MAMMOGRAM  11/16/2024   DTaP/Tdap/Td (3 - Td or Tdap) 04/17/2032   Pneumonia Vaccine 69+ Years old  Completed   INFLUENZA VACCINE  Completed   DEXA SCAN  Completed   Hepatitis C Screening  Completed   Zoster Vaccines- Shingrix  Completed   HPV VACCINES  Aged Out   Colonoscopy  Discontinued    Observations/Objective:   Assessment and Plan: # Recurrent UTI  - continue methenamine and vitamin C as is. Medication refilled per patient request  - CBC and CMP, she wants to do it with her PCP in 2 weeks and fax labs to [place] - Fu in 4 months    # Incomplete Quadriplegia  # Neurogenic Bladder s/p SPC # Urinary Incontinence - On Fesoterodine and Mirabegron - Follows Urology   # Bronchiectasis/multiple abnormal findings in CT chest  - needs to fu with Pulmonology   Follow Up Instructions: 4 months    I discussed the assessment and treatment plan with the patient. The patient was provided an opportunity to ask questions and all were answered. The patient agreed with the plan and demonstrated an understanding of the  instructions.   The patient was advised to call back or seek an in-person evaluation if the symptoms worsen or if the condition fails to improve as anticipated.  I provided 30 minutes of non-face-to-face time during this encounter.  Of note, portions of this note may have been created with voice recognition software. While this note has been edited for accuracy, occasional wrong-word or 'sound-a-like' substitutions may have occurred due to the inherent limitations of voice recognition software.   Victoriano Lain, MD [facility] [phone] pager   [phone] cell 08/30/2023, 2:49 PM

## 2023-08-31 ENCOUNTER — Ambulatory Visit: Payer: Medicare PPO | Admitting: Physical Medicine and Rehabilitation

## 2023-09-01 ENCOUNTER — Ambulatory Visit: Admitting: Podiatry

## 2023-09-02 ENCOUNTER — Encounter: Payer: Medicare PPO | Attending: Physical Medicine and Rehabilitation | Admitting: Physical Medicine and Rehabilitation

## 2023-09-02 ENCOUNTER — Encounter: Payer: Self-pay | Admitting: Physical Medicine and Rehabilitation

## 2023-09-02 VITALS — BP 122/72 | HR 71 | Temp 98.1°F | Ht 64.0 in

## 2023-09-02 DIAGNOSIS — Z993 Dependence on wheelchair: Secondary | ICD-10-CM | POA: Insufficient documentation

## 2023-09-02 DIAGNOSIS — G8254 Quadriplegia, C5-C7 incomplete: Secondary | ICD-10-CM | POA: Diagnosis not present

## 2023-09-02 DIAGNOSIS — R252 Cramp and spasm: Secondary | ICD-10-CM | POA: Insufficient documentation

## 2023-09-02 DIAGNOSIS — M792 Neuralgia and neuritis, unspecified: Secondary | ICD-10-CM | POA: Diagnosis not present

## 2023-09-02 DIAGNOSIS — S31829D Unspecified open wound of left buttock, subsequent encounter: Secondary | ICD-10-CM | POA: Insufficient documentation

## 2023-09-02 NOTE — Patient Instructions (Signed)
 Pt is a 73 yr old L handed female with hx of incomplete quadriplegia- 2/14 2022- fleeing the police in [place] on passenger 100 (high speed) miles/hour,  [name]; neurogenic bowel and bladder and spasticity; [name], [name] Here for f/u on Incomplete quadriplegia   Aide; [name]   Suggest adding Tizanidine 2 mg in Am- so doing BID until spasms/spasticity better controlled. Since Rx already written for 4 mg BID prn- can increase without changing med dose.    Pressure relief- every 15-20 minutes-  needs to lay back- and will help heal wound faster and reason needs to do to help wounds heal   3.  Can get into salt water but not chlorine pool- when has wound.  And afterwards, try and use Salt water.    4. Can try compression stockings- or socks- use the minimum that needs of compression because can set off AD.  Give a try with compression garments.   5.  We discussed Botox for hands if need be.  Will think about it and will have appt with her to discuss Botox     6.  Con't Baclofen and Tizanidine- filled January and February.    7.  PCP refilling Myrbetriq- doesn't need refill today.    8. F/U in 3 months double appt- SCI

## 2023-09-02 NOTE — Progress Notes (Signed)
 Subjective:    Patient ID: [name], female    DOB: 14-Apr-1951, 73 y.o.   MRN: [id number]  HPI Pt is a 73 yr old L handed female with hx of incomplete quadriplegia- 2/14 2022- fleeing the police in [place] on passenger 100 (high speed) miles/hour,  [name]; neurogenic bowel and bladder and spasticity; [name], [name] Here for f/u on Incomplete quadriplegia   Aide; [name]    Had surgery and rods inserted for B/L femur  fx's.  December 22/2024- is when it was done.  No restrictions now- and had gone back to PT/OT in [place] and using [facility] again.   Hasn't talked to PT and OT up here yet.   Still has a quite a bit of swelling in leg-was told 4-6 months.  Esp around knees.    Spasms have changed- since fx's.  Afraid it's permanent, but much more spasms- much more intensive and full body- and legs "twist together' and will go up spine and affect her arms if big one.   Has been 1 improvement- usually first thing in AM now.  Or goes over really rough terrain- like pavers, but is better in w/c now.   Worried will be permanent.   Ortho wants to her to go on Skelaxin.  Taking Baclofen 140 mg/day- 160 mg/day made her zombie/zonked out- lasted 1 day Doing 140 mg/day- now.  It sticks when she's swallowing it.   Taking 2 mg at bedtime of Zanaflex.   Since surgery, has more tone in L hand- has to fight to get fingers to get straightened out.   Hasn't done Botox    Wound on  L gluteal fold- Stage II- 2x2.5 cm- very superficial depth.  Last saw wound care 3/10- in [facility]- had been debriding it.  Has appt 4/23-  here- Using a foam and Hydroferra Blue-  for wound care.  Puracol plus- collagen on wound bed.   Doing pressure relief- when thinks of it.  Will lift and then will rock back- 1x/hour max.   Used a aquatic pool- and had severe UTI after that usage- was chlorine pool.     Pain Inventory Average Pain 4 Pain Right Now 4 My pain is constant, burning,  and tingling  LOCATION OF PAIN  both elbows down to finger tips  BOWEL Number of stools per week: 2 Oral laxative use No  Type of laxative None Enema or suppository use Yes  History of colostomy No  Incontinent No   BLADDER Suprapubic    Mobility ability to climb steps?  no do you drive?  no use a wheelchair needs help with transfers Do you have any goals in this area?  yes  Function retired I need assistance with the following:  dressing, bathing, toileting, meal prep, household duties, and shopping Do you have any goals in this area?  yes  Neuro/Psych weakness numbness tremor trouble walking spasms  Prior Studies Any changes since last visit?  yes x-rays X rays of both legs in [place]   Physicians involved in your care Any changes since last visit?  yes [facility]   Family History  Problem Relation Age of Onset   Hypertension Father    Osteoporosis Other    Breast cancer Neg Hx    Social History   Socioeconomic History   Marital status: Married    Spouse name: Not on file   Number of children: Not on file  Years of education: Not on file   Highest education level: Doctorate  Occupational History   Not on file  Tobacco Use   Smoking status: Never   Smokeless tobacco: Never  Vaping Use   Vaping status: Never Used  Substance and Sexual Activity   Alcohol use: Not Currently    Alcohol/week: 7.0 standard drinks of alcohol    Types: 7 Glasses of wine per week   Drug use: Yes    Comment: wine at night   Sexual activity: Not on file  Other Topics Concern   Not on file  Social History Narrative   Married   Spouse had CABG    [employer] professor PhD   [employer]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   Had moved to [place]   hh of 2    Quadripareisis from [employer] injury     No current pets       Social Drivers of Corporate investment banker Strain: Low Risk  (05/01/2023)   Overall Financial Resource Strain (CARDIA)    Difficulty of Paying  Living Expenses: Not hard at all  Food Insecurity: No Food Insecurity (05/01/2023)   Hunger Vital Sign    Worried About Running Out of Food in the Last Year: Never true    Ran Out of Food in the Last Year: Never true  Transportation Needs: No Transportation Needs (05/01/2023)   PRAPARE - Administrator, Civil Service (Medical): No    Lack of Transportation (Non-Medical): No  Physical Activity: Inactive (05/01/2023)   Exercise Vital Sign    Days of Exercise per Week: 0 days    Minutes of Exercise per Session: 60 min  Stress: No Stress Concern Present (05/01/2023)   [employer] of Occupational Health - Occupational Stress Questionnaire    Feeling of Stress : Only a little  Social Connections: Moderately Integrated (05/01/2023)   Social Connection and Isolation Panel [NHANES]    Frequency of Communication with Friends and Family: More than three times a week    Frequency of Social Gatherings with Friends and Family: Once a week    Attends Religious Services: Never    Database administrator or Organizations: Yes    Attends Engineer, structural: More than 4 times per year    Marital Status: Married   Past Surgical History:  Procedure Laterality Date   BUNIONECTOMY     Past Medical History:  Diagnosis Date   CERVICAL POLYP 03/11/2008   Qualifier: Diagnosis of  By: [name] MD, [name]    Colon polyps 2005   on colonscopy [name]   Fibroid 2004   Per Dr. [name]   History of shingles    face and mouth   Hx of skin cancer, basal cell    Rosacea    Sciatica of left side 09/28/2013   Scoliosis    noted on mri done for back pain   There were no vitals taken for this visit.  Opioid Risk Score:   Fall Risk Score:  `1  Depression screen PHQ 2/9     04/25/2023   10:14 AM 03/15/2023    3:19 PM 10/13/2022    9:40 AM 08/13/2022   12:36 PM 03/02/2022   12:05 PM 10/09/2021    1:51 PM 02/24/2021    2:39 PM  Depression screen PHQ 2/9  Decreased Interest 0 0 0  0 0 0 0  Down, Depressed, Hopeless 0 0 0 0 0 0 0  PHQ - 2  Score 0 0 0 0 0 0 0    Review of Systems  Musculoskeletal:  Positive for gait problem.       Spasms  Neurological:  Positive for weakness and numbness.       Tingling  All other systems reviewed and are negative.      Objective:   Physical Exam  Awake, alert, appropriate, in power w/c; accompanied by husband and Caregiver, NAD Has full ROM of PIPs and MCP's on L and R- however MAS of 1+ in her PIPS, more than MCP's-  2+ LE edema- worse in knees than ankles B/L Healed incision/scars from femur fx's Neuro: 10 beats clonus on RLE and 5 beats LLE MAS of 1+ in hips, and knees and ankles B/L      Assessment & Plan:   Pt is a 73 yr old L handed female with hx of incomplete quadriplegia- 2/14 2022- fleeing the police in [place] on passenger 100 (high speed) miles/hour,  [name]; neurogenic bowel and bladder and spasticity; [name], [name] Here for f/u on Incomplete quadriplegia   Aide; [name]   Suggest adding Tizanidine 2 mg in Am- so doing BID until spasms/spasticity better controlled. Since Rx already written for 4 mg BID prn- can increase without changing med dose.    Pressure relief- every 15-20 minutes-  needs to lay back- and will help heal wound faster and reason needs to do to help wounds heal   3.  Can get into salt water but not chlorine pool- when has wound.  And afterwards, try and use Salt water.    4. Can try compression stockings- or socks- use the minimum that needs of compression because can set off AD.  Give a try with compression garments.   5.  We discussed Botox for hands if need be.  Will think about it and will have appt with her to discuss Botox     6.  Con't Baclofen and Tizanidine- filled January and February.    7.  PCP refilling Myrbetriq- doesn't need refill today.    8. F/U in 3 months double appt- SCI  9. We discussed maybe taking a break from therapy- will think  about it.     I spent a total of  43  minutes on total care today- >50% coordination of care- due to   d/w pt about Botox- refills; as well as salt water pool vs chlorline; chang ein spasticity meds- and pressure relief-

## 2023-09-06 NOTE — Telephone Encounter (Signed)
 Fesoterodine 8mg  was sent on 07/30/2023 by Worthy Rancher, NP.   Follow up with with [facility] and spoke to [place]. She confirm they did received and for pt to log into the account then placed the order. She also states she has sent email out to pt.   Follow up with pt. Inform her of information above. Pt states she will check her email and try to log in and place the order. If needs anything else, pt states she will reach out to [place].
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
## 2023-09-09 ENCOUNTER — Ambulatory Visit: Admitting: Occupational Therapy

## 2023-09-09 ENCOUNTER — Encounter: Payer: Self-pay | Admitting: Occupational Therapy

## 2023-09-09 ENCOUNTER — Ambulatory Visit: Attending: Physical Medicine and Rehabilitation | Admitting: Physical Therapy

## 2023-09-09 DIAGNOSIS — M24541 Contracture, right hand: Secondary | ICD-10-CM | POA: Insufficient documentation

## 2023-09-09 DIAGNOSIS — G8254 Quadriplegia, C5-C7 incomplete: Secondary | ICD-10-CM | POA: Diagnosis not present

## 2023-09-09 DIAGNOSIS — R29818 Other symptoms and signs involving the nervous system: Secondary | ICD-10-CM | POA: Insufficient documentation

## 2023-09-09 DIAGNOSIS — M24542 Contracture, left hand: Secondary | ICD-10-CM | POA: Diagnosis present

## 2023-09-09 DIAGNOSIS — R208 Other disturbances of skin sensation: Secondary | ICD-10-CM | POA: Insufficient documentation

## 2023-09-09 DIAGNOSIS — R2689 Other abnormalities of gait and mobility: Secondary | ICD-10-CM | POA: Diagnosis not present

## 2023-09-09 DIAGNOSIS — R293 Abnormal posture: Secondary | ICD-10-CM | POA: Diagnosis not present

## 2023-09-09 DIAGNOSIS — R29898 Other symptoms and signs involving the musculoskeletal system: Secondary | ICD-10-CM | POA: Diagnosis not present

## 2023-09-09 DIAGNOSIS — M6281 Muscle weakness (generalized): Secondary | ICD-10-CM | POA: Insufficient documentation

## 2023-09-09 DIAGNOSIS — R278 Other lack of coordination: Secondary | ICD-10-CM | POA: Diagnosis not present

## 2023-09-09 NOTE — Therapy (Signed)
 OUTPATIENT PHYSICAL THERAPY NEURO EVALUATION   Patient Name: [name] MRN: [id number] DOB:1950-12-25, 73 y.o., female Today's Date: 09/09/2023   PCP: Carmen Headings, MD REFERRING PROVIDER: [name], MD  END OF SESSION:  PT End of Session - 09/09/23 1151     Visit Number 1    Number of Visits 17   with eval   Date for PT Re-Evaluation 11/18/23   to allow for scheduling delays   Authorization Type [employer] MEDICARE    PT Start Time 1150   pt arrived late   PT Stop Time 1230    PT Time Calculation (min) 40 min    Activity Tolerance Patient tolerated treatment well    Behavior During Therapy [facility] for tasks assessed/performed             Past Medical History:  Diagnosis Date   CERVICAL POLYP 03/11/2008   Qualifier: Diagnosis of  By: Carmen Sharp MD, [name]    Colon polyps 2005   on colonscopy [name]   Fibroid 2004   Per Dr. Dareen [name]   History of shingles    face and mouth   Hx of skin cancer, basal cell    Rosacea    Sciatica of left side 09/28/2013   Scoliosis    noted on mri done for back pain   Past Surgical History:  Procedure Laterality Date   BUNIONECTOMY     Patient Active Problem List   Diagnosis Date Noted   Buttock wound, left, subsequent encounter 03/16/2023   Bronchiectasis with acute exacerbation (HCC) 03/15/2023   Buttock wound, left, initial encounter 11/02/2022   Orthostatic hypotension 08/13/2022   Neurogenic bowel 05/03/2022   Spasticity 05/03/2022   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/03/2022   Nerve pain 05/03/2022   Medication monitoring encounter 01/08/2022   Neurogenic bladder 10/11/2021   Urinary incontinence 10/11/2021   ESBL (extended spectrum beta-lactamase) producing bacteria infection 10/09/2021   Recurrent UTI 10/09/2021   Quadriplegia, C5-C7 incomplete (HCC) 01/16/2021   History of spinal fracture 01/16/2021   Suprapubic catheter (HCC) 01/16/2021   Encounter for routine gynecological examination 09/28/2013   Onychomycosis  09/28/2013   Foot deformity, acquired 03/26/2012   Encounter for preventive health examination 12/25/2010   ROSACEA 08/25/2009   Disturbance in sleep behavior 03/11/2008   SKIN CANCER, HX OF 03/11/2008   DYSURIA, HX OF 03/11/2008   Hyperlipidemia 02/10/2007   CERVICALGIA 02/10/2007    ONSET DATE: 08/29/2023 (referral date)  REFERRING DIAG: G82.54 (ICD-10-CM) - Incomplete quadriplegia at C5-C8 level (HCC)  THERAPY DIAG:  Muscle weakness (generalized)  Other symptoms and signs involving the nervous system  Other symptoms and signs involving the musculoskeletal system  Quadriplegia, C5-C7 incomplete (HCC)  Abnormal posture  Other abnormalities of gait and mobility  Rationale for Evaluation and Treatment: Rehabilitation  SUBJECTIVE:  SUBJECTIVE STATEMENT: Pt familiar to this clinic, last seen Oct-Nov 2024 but has been seen for multiple POCs since her initial injury in 2022. Pt returns to this clinic after being in [place] for the past few months. While in [place] in December 2024 patient had a fall where she slid forwards out of her wheelchair onto the floor, ended up fracturing both of her femurs. Pt was hospitalized for 10-11 days and had surgical repair of her femurs. Pt reports she has been cleared of all restrictions since surgery, does have ongoing swelling in both legs and worsened spasticity. Pt reports that the spasticity has slightly improved since it initially started, was told by [name] the swelling may not resolve for 6-8 months (it has been 3 months) and not sure if spasticity will resolve but patient is hopeful that if her swelling improves her spasticity will improve as well.  Pt asking about other options to help manage the swelling her legs, has tried variable compression  stockings (knee high) and her PTs in [place] recommended tight shapewear. Pt reports that the knee-high compression stockings led to increased swelling in her knees and the shapewear did not help her swelling. Encouraged patient to get thigh-high compression stockings and elevate her LE in PWC. Pt has ordered an articulating bed that will get here next Monday (3/31) so she can better elevate her legs when in bed.  Pt is also concerned about decreased ROM in her legs, [name] works on stretching her legs frequently but she feels she has more motion in her LLE as compared to RLE and may have mild foot drop on her R side. Pt does have PRAFOs to wear at night. Pt has worked up to standing in her standing frame x 30-40 min at a time. Pt also worked on standing with her PT and in // bars in [place]. Pt also reports with her injuries she lost the ability to lock/unlock her R knee but that it is getting better. She reports she lost a lot of stamina during her hospital stay as well.  Pt also has a new wound since last seen in this clinic in her L gluteal fold, shearing injury. Pt was seeing wound care in [place] and is scheduled to see wound care with [facility] early April (was not able to schedule with [facility] wound care until late April).  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 Yes: NPRS scale: 3-4 Pain location: elbows to fingertips on both arms Pain description: nerve pain Aggravating factors: not stated Relieving factors: not stated  PRECAUTIONS: Fall and Other: osteoporosis  RED FLAGS: None   WEIGHT BEARING RESTRICTIONS: No  FALLS: Has patient fallen in last 6 months? Yes. Number of falls 1 fall in [place] that resulted in B femur fractures  LIVING ENVIRONMENT: Lives with: lives with their spouse and and with full-time caregiver [name] Lives in:  House/apartment Home is power wheelchair accessible Has following equipment at home: Wheelchair (power), Wheelchair (manual), Grab bars, Ramped entry, and standing frame, slide board  PLOF: Independent with household mobility with device, Independent with community mobility with device, Requires assistive device for independence, Needs assistance with ADLs, and Needs assistance with transfers  PATIENT GOALS: "still working on stand and pivot with goal to pivot to commode or to a chair" "work on core-will help me with standing" "improve my stamina -  being in the hospital I lost strength/endurance"   OBJECTIVE:  Note: Objective measures were completed at Evaluation unless otherwise noted.  DIAGNOSTIC FINDINGS: None update/relevant to this POC  COGNITION: Overall cognitive status: Within functional limits for tasks assessed   SENSATION: Decreased sensation in BUE and BLE secondary to incomplete quadriplegia Decreased sensation in proximal LLE as compared to distal LE  EDEMA:  Circumferential: R knee: 17"; L knee: 17.5" and Figure 8: R ankle 21", L ankle 21.5"  MUSCLE TONE: increased spasticity in BLE   POSTURE: rounded shoulders and forward head  LOWER EXTREMITY ROM:     Passive  Right Eval Left Eval  Hip flexion Tight hip flexors Tight hip flexors  Hip extension    Hip abduction    Hip adduction    Hip internal rotation    Hip external rotation    Knee flexion Tight HS Tight HS  Knee extension    Ankle dorsiflexion Decreased, tight gastroc Decreased, tight gastroc  Ankle plantarflexion    Ankle inversion    Ankle eversion     (Blank rows = not tested)  LOWER EXTREMITY MMT:    MMT Right Eval Left Eval  Hip flexion 1 2-  Hip extension    Hip abduction    Hip adduction    Hip internal rotation    Hip external rotation    Knee flexion 0 3  Knee extension 2- 2-  Ankle dorsiflexion 2- 3  Ankle plantarflexion    Ankle inversion    Ankle eversion    (Blank rows =  not tested)  BED MOBILITY:  From previous POC: Sit to supine Mod A Supine to sit Mod A Rolling to Right Mod A Rolling to Left Mod A Undulating mattress for wound management on standard bed (elevated-so often doing uphill sliding board transfers); she would like to continue working on sitting up independently, she has been working on rolling, needs less assistance w/ this when someone props her leg into hooklying; would like something to help her pull her left leg to her butt for stretching as well as bed mobility.  TRANSFERS: From previous POC: Pt continues using combination of bump over, slide board, and depression (squat pivot) transfers.                                                                                                                              TREATMENT: PT Evaluation    PATIENT EDUCATION: Education details: Eval findings, PT POC Person educated: Patient and Arts administrator Education method: Explanation Education comprehension: verbalized understanding  HOME EXERCISE PROGRAM: Will be established as needed as pt has done continuous therapy and is working towards functional tasks.   GOALS: Goals reviewed with patient? Yes  SHORT TERM GOALS: Target date: 10/07/2023  HEP to be established for stretching and strengthening as appropriate. Baseline: not established at initial eval Goal status: INITIAL  2.  Sit to stand transfer to be assessed and STG  set Baseline:  Goal status: INITIAL  3.  Pt to tolerate standing x 5 min with LRAD to demonstrate improved endurance Baseline:  Goal status: INITIAL  4.  Pt to demonstrate reduced edema in BLE with a reduction in circumference/figure 8 measurement by 0.5" from initial evaluation. Baseline: Circumferential: R knee: 17"; L knee: 17.5" and Figure 8: R ankle 21", L ankle 21.5" Goal status: INITIAL   LONG TERM GOALS: Target date: 11/04/2023    Patient and her caregiver to be independent with performance of HEP  for stretching and strengthening as appropriate. Baseline: not established at initial eval Goal status: INITIAL  2.  Sit to stand transfer to be assessed and LTG set Baseline:  Goal status: INITIAL  3.  Pt to tolerate standing x 10 min with LRAD to demonstrate improved endurance Baseline:  Goal status: INITIAL  4.  Pt to demonstrate reduced edema in BLE with a reduction in circumference/figure 8 measurement by 1" from initial evaluation. Baseline: Circumferential: R knee: 17"; L knee: 17.5" and Figure 8: R ankle 21", L ankle 21.5" Goal status: INITIAL    ASSESSMENT:  CLINICAL IMPRESSION: Patient is a 73 year old female referred to Neuro OPPT for incomplete quadriplegia.   Pt's PMH is significant for: C7 ASIA C- incomplete quad w/ neurogenic bladder and bowel, HLD, Hx of skin cancer. The following deficits were present during the exam: decreased BLE strength and ROM, increased LE edema, sensory impairments, and impaired endurance. Based on her fall history and above-mentioned deficits and impairments, pt is an increased risk for falls. Pt would benefit from skilled PT to address these impairments and functional limitations to maximize functional mobility independence.   OBJECTIVE IMPAIRMENTS: decreased balance, decreased endurance, decreased mobility, difficulty walking, decreased ROM, decreased strength, increased edema, impaired perceived functional ability, increased muscle spasms, impaired flexibility, impaired sensation, impaired tone, impaired UE functional use, postural dysfunction, and pain.   ACTIVITY LIMITATIONS: carrying, lifting, bending, standing, stairs, transfers, bed mobility, continence, bathing, toileting, dressing, reach over head, and hygiene/grooming  PARTICIPATION LIMITATIONS: meal prep, cleaning, laundry, driving, shopping, community activity, and occupation  PERSONAL FACTORS: Age, Sex, Time since onset of injury/illness/exacerbation, and 1-2 comorbidities:    C7  ASIA C- incomplete quad w/ neurogenic bladder and bowel, HLD, Hx of skin cancerare also affecting patient's functional outcome.   REHAB POTENTIAL: Good  CLINICAL DECISION MAKING: Stable/uncomplicated  EVALUATION COMPLEXITY: High  PLAN:  PT FREQUENCY: 2x/week  PT DURATION: 8 weeks  PLANNED INTERVENTIONS: 97164- PT Re-evaluation, 97110-Therapeutic exercises, 97530- Therapeutic activity, 97112- Neuromuscular re-education, 97535- Self Care, 16109- Manual therapy, [phone]- Gait training, [phone]- Orthotic Fit/training, [phone]- Electrical stimulation (manual), Patient/Family education, Balance training, Stair training, Taping, Dry Needling, Joint mobilization, Scar mobilization, Compression bandaging, DME instructions, Wheelchair mobility training, Cryotherapy, and Moist heat  PLAN FOR NEXT SESSION: assess sit to stand transfers and set STG/LTG, stretching and hands-on training with [name] regarding home stretching program, sit to stands, standing tolerance with stedy or possibly with RW, core strengthening/stability, endurance; wants to have conversation about taking a break (3-6 months) after this POC near end of this POC  Peter Congo, PT Peter Congo, PT, DPT, CSRS  09/09/2023, 2:21 PM

## 2023-09-09 NOTE — Patient Instructions (Signed)
 Best Electrode Placement for Arm and Hand Stroke Rehabilitation Electrical stimulation, also referred to as e-stim, NMES, or FES, can be an effective tool in reducing the symptoms of stroke, such as increasing strength and function. The success of one's recovery using electrical stimulation will rely heavily on proper electrode placement.  Listed below are some key video examples of upper limb electrode positioning by Axelgaard. Click on the thumbnail below to visit the video link. Electrode Positions for Common UE Stroke Muscle Groups 1. SHOULDER Retraction - shoulder blade moves back toward spine  2. SHOULDER Flexion (black in front, red in back) - arm comes forward 3. SHOULDER Abduction (red in front, black in back) - arm comes away from body and out to side       4. ELBOW Flexion - elbow bends  5. ELBOW Extension - elbow straightens             6. WRIST/FINGER Extension - wrist comes up and fingers straighten  7. FINGER Flexion - fingers bend             8. THUMB Abduction - thumb moves away from palm  9. THUMB Opposition - thumb moves toward pad of index finger   Information obtained from https://[email or web address].com/electrode-placement/. Please visit this website for videos and additional information as needed.

## 2023-09-13 ENCOUNTER — Ambulatory Visit (INDEPENDENT_AMBULATORY_CARE_PROVIDER_SITE_OTHER): Admitting: Podiatry

## 2023-09-13 ENCOUNTER — Encounter: Payer: Self-pay | Admitting: Podiatry

## 2023-09-13 ENCOUNTER — Encounter: Payer: Self-pay | Admitting: Internal Medicine

## 2023-09-13 DIAGNOSIS — M79675 Pain in left toe(s): Secondary | ICD-10-CM | POA: Diagnosis not present

## 2023-09-13 DIAGNOSIS — M79674 Pain in right toe(s): Secondary | ICD-10-CM

## 2023-09-13 DIAGNOSIS — B351 Tinea unguium: Secondary | ICD-10-CM | POA: Diagnosis not present

## 2023-09-13 NOTE — Progress Notes (Unsigned)
 No chief complaint on file.   HPI: [name] 73 y.o. come in for Chronic disease management  Had fx femur earlier this year  back on forteo  ROS: See pertinent positives and negatives per HPI.  Past Medical History:  Diagnosis Date   CERVICAL POLYP 03/11/2008   Qualifier: Diagnosis of  By: [name] MD, [name]    Colon polyps 2005   on colonscopy [name]   Fibroid 2004   Per Dr. [name]   History of shingles    face and mouth   Hx of skin cancer, basal cell    Rosacea    Sciatica of left side 09/28/2013   Scoliosis    noted on mri done for back pain    Family History  Problem Relation Age of Onset   Hypertension Father    Osteoporosis Other    Breast cancer Neg Hx     Social History   Socioeconomic History   Marital status: Married    Spouse name: Not on file   Number of children: Not on file   Years of education: Not on file   Highest education level: Doctorate  Occupational History   Not on file  Tobacco Use   Smoking status: Never   Smokeless tobacco: Never  Vaping Use   Vaping status: Never Used  Substance and Sexual Activity   Alcohol use: Not Currently    Alcohol/week: 7.0 standard drinks of alcohol    Types: 7 Glasses of wine per week   Drug use: Yes    Comment: wine at night   Sexual activity: Not on file  Other Topics Concern   Not on file  Social History Narrative   Married   Spouse had CABG    [employer] professor PhD   [employer]le[employer]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   Had moved to [place]   hh of 2    Quadripareisis from [employer] injury     No current pets       Social Drivers of Corporate investment banker Strain: Low Risk  (09/11/2023)   Received from [facility]   Overall Financial Resource Strain (CARDIA)    Difficulty of Paying Living Expenses: Not hard at all  Food Insecurity: No Food Insecurity (09/11/2023)   Received [facility]   Hunger Vital Sign    Worried About Running Out of Food in the Last Year: Never true    Ran Out of Food in the  Last Year: Never true  Transportation Needs: No Transportation Needs (09/11/2023)   Received from [facility] - Transportation    Lack of Transportation (Medical): No    Lack of Transportation (Non-Medical): No  Physical Activity: Insufficiently Active (09/11/2023)   [facility] from [facility]   Exercise Vital Sign    Days of Exercise per Week: 3 days    Minutes of Exercise per Session: 20 min  Stress: No Stress Concern Present (09/11/2023)   Received from [facility] Occupational [facility] - Occupational Stress Questionnaire    Feeling of Stress : Not at all  Social Connections: Socially Integrated (09/11/2023)   Received from [facility]   Social Network    How would you rate your social network (family, work, friends)?: Good participation with social networks    Outpatient Medications Prior to Visit  Medication Sig Dispense Refill   ascorbic acid (VITAMIN C) 1000 MG tablet Take 1 tablet (1,000 mg total) by mouth in  the morning, at noon, in the evening, and at bedtime. 120 tablet 5   atorvastatin (LIPITOR) 20 MG tablet TAKE 1 TABLET BY MOUTH EVERY DAY 90 tablet 1   baclofen (LIORESAL) 20 MG tablet Increasing baclofen to 40 mg 4x/day- after leg fractures- for spasticity- 720 tablet 3   bisacodyl (DULCOLAX) 10 MG suppository Place 10 mg rectally as needed for moderate constipation. Insert one suppository per rectum with each bowel program procedure.     cholecalciferol (VITAMIN D3) 25 MCG (1000 UNIT) tablet Take 1,000 Units by mouth daily. 2000u     [employer] COENZYME Q-10 100 MG capsule Take 100 mg by mouth daily.      Docusate Sodium (DSS) 100 MG CAPS Take by mouth.     famotidine (PEPCID) 20 MG tablet Take 20 mg by mouth 2 (two) times daily.     fesoterodine (TOVIAZ) 8 MG TB24 tablet Take 1 tablet (8 mg total) by mouth daily. 90 tablet 1   gabapentin (NEURONTIN) 600 MG tablet TAKE 2 TABLETS (1,200 MG TOTAL) BY MOUTH 3 (THREE) TIMES DAILY. 270 tablet 1    methenamine (HIPREX) 1 g tablet Take 1 tablet (1 g total) by mouth 2 (two) times daily with a meal. 60 tablet 5   metroNIDAZOLE (METROGEL) 1 % gel APPLY TOPICALLY EVERY DAY 60 g 10   Multiple Vitamins-Minerals (CENTRUM SILVER ULTRA WOMENS PO) Take by mouth.     mupirocin ointment (BACTROBAN) 2 % Apply 1 Application topically 2 (two) times daily. 30 g 2   MYRBETRIQ 50 MG TB24 tablet TAKE 1 TABLET BY MOUTH EVERY DAY 30 tablet 10   naproxen sodium (ALEVE) 220 MG tablet Take 220 mg by mouth. Tablet p.o per package directions as needed for pain     nortriptyline (PAMELOR) 50 MG capsule Take 1 capsule (50 mg total) by mouth at bedtime. 90 capsule 1   SENNA CO by Combination route. Sennosides 8.6mg  tab p.o per package directions daily as needed to promote bowel movement.     Teriparatide (FORTEO) 600 MCG/2.4ML SOPN Inject 20 mcg into the skin daily in the afternoon. 2.4 mL 3   tiZANidine (ZANAFLEX) 4 MG tablet Take 0.5-1 tablets (2-4 mg total) by mouth 2 (two) times daily as needed for muscle spasms. To take with baclofen- for increasing spasticity in SCI patient- needs BOTH meds- 60 tablet 5   UNABLE TO FIND Med Name: Saline Enema Per rectum per package directions as needed for relief of constipation.     zolpidem (AMBIEN) 5 MG tablet Take 0.5 tablets (2.5 mg total) by mouth at bedtime as needed for sleep. 45 tablet 0   No facility-administered medications prior to visit.     EXAM:  There were no vitals taken for this visit.  There is no height or weight on file to calculate BMI.  GENERAL: vitals reviewed and listed above, alert, oriented, appears well hydrated and in no acute distress HEENT: atraumatic, conjunctiva  clear, no obvious abnormalities on inspection of external nose and ears OP : no lesion edema or exudate  NECK: no obvious masses on inspection palpation  LUNGS: clear to auscultation bilaterally, no wheezes, rales or rhonchi, good air movement CV: HRRR, no clubbing cyanosis or   peripheral edema nl cap refill  MS: moves all extremities without noticeable focal  abnormality PSYCH: pleasant and cooperative, no obvious depression or anxiety Lab Results  Component Value Date   WBC 4.3 05/05/2023   HGB 12.4 05/05/2023   HCT 37.0 05/05/2023  PLT 203.0 05/05/2023   GLUCOSE 87 04/16/2023   CHOL 136 10/14/2022   TRIG 64.0 10/14/2022   HDL 53.80 10/14/2022   LDLDIRECT 162.3 03/21/2012   LDLCALC 70 10/14/2022   ALT 33 04/16/2023   AST 32 04/16/2023   NA 138 04/16/2023   K 3.8 04/16/2023   CL 101 04/16/2023   CREATININE 0.35 (L) 04/16/2023   BUN 9 04/16/2023   CO2 30 04/16/2023   TSH 1.72 10/14/2022   BP Readings from Last 3 Encounters:  09/02/23 122/72  05/05/23 99/68  04/25/23 106/67    ASSESSMENT AND PLAN:  Discussed the following assessment and plan:  No diagnosis found.  -Patient advised to return or notify health care team  if  new concerns arise.  There are no Patient Instructions on file for this visit.

## 2023-09-14 ENCOUNTER — Encounter: Payer: Self-pay | Admitting: Podiatry

## 2023-09-14 ENCOUNTER — Ambulatory Visit: Admitting: Internal Medicine

## 2023-09-14 ENCOUNTER — Encounter: Payer: Self-pay | Admitting: Internal Medicine

## 2023-09-14 VITALS — BP 106/68 | HR 67 | Temp 97.8°F | Ht 64.0 in | Wt 113.0 lb

## 2023-09-14 DIAGNOSIS — D649 Anemia, unspecified: Secondary | ICD-10-CM

## 2023-09-14 DIAGNOSIS — G8254 Quadriplegia, C5-C7 incomplete: Secondary | ICD-10-CM

## 2023-09-14 DIAGNOSIS — Z993 Dependence on wheelchair: Secondary | ICD-10-CM

## 2023-09-14 DIAGNOSIS — M810 Age-related osteoporosis without current pathological fracture: Secondary | ICD-10-CM

## 2023-09-14 DIAGNOSIS — Z960 Presence of urogenital implants: Secondary | ICD-10-CM

## 2023-09-14 DIAGNOSIS — R8290 Unspecified abnormal findings in urine: Secondary | ICD-10-CM

## 2023-09-14 DIAGNOSIS — Z79899 Other long term (current) drug therapy: Secondary | ICD-10-CM

## 2023-09-14 DIAGNOSIS — N319 Neuromuscular dysfunction of bladder, unspecified: Secondary | ICD-10-CM | POA: Diagnosis not present

## 2023-09-14 DIAGNOSIS — H6121 Impacted cerumen, right ear: Secondary | ICD-10-CM

## 2023-09-14 LAB — CBC WITH DIFFERENTIAL/PLATELET
Basophils Absolute: 0 10*3/uL (ref 0.0–0.1)
Basophils Relative: 0.9 % (ref 0.0–3.0)
Eosinophils Absolute: 0.3 10*3/uL (ref 0.0–0.7)
Eosinophils Relative: 4.6 % (ref 0.0–5.0)
HCT: 38.2 % (ref 36.0–46.0)
Hemoglobin: 12.8 g/dL (ref 12.0–15.0)
Lymphocytes Relative: 16.3 % (ref 12.0–46.0)
Lymphs Abs: 0.9 10*3/uL (ref 0.7–4.0)
MCHC: 33.6 g/dL (ref 30.0–36.0)
MCV: 96.1 fl (ref 78.0–100.0)
Monocytes Absolute: 0.4 10*3/uL (ref 0.1–1.0)
Monocytes Relative: 7.7 % (ref 3.0–12.0)
Neutro Abs: 3.9 10*3/uL (ref 1.4–7.7)
Neutrophils Relative %: 70.5 % (ref 43.0–77.0)
Platelets: 199 10*3/uL (ref 150.0–400.0)
RBC: 3.98 Mil/uL (ref 3.87–5.11)
RDW: 13.7 % (ref 11.5–15.5)
WBC: 5.5 10*3/uL (ref 4.0–10.5)

## 2023-09-14 LAB — COMPREHENSIVE METABOLIC PANEL WITH GFR
ALT: 47 U/L — ABNORMAL HIGH (ref 0–35)
AST: 38 U/L — ABNORMAL HIGH (ref 0–37)
Albumin: 4.2 g/dL (ref 3.5–5.2)
Alkaline Phosphatase: 116 U/L (ref 39–117)
BUN: 17 mg/dL (ref 6–23)
CO2: 33 meq/L — ABNORMAL HIGH (ref 19–32)
Calcium: 9.5 mg/dL (ref 8.4–10.5)
Chloride: 98 meq/L (ref 96–112)
Creatinine, Ser: 0.36 mg/dL — ABNORMAL LOW (ref 0.40–1.20)
GFR: 101.58 mL/min (ref >60.00)
Glucose, Bld: 80 mg/dL (ref 70–99)
Potassium: 4.3 meq/L (ref 3.5–5.1)
Sodium: 138 meq/L (ref 135–145)
Total Bilirubin: 0.5 mg/dL (ref 0.2–1.2)
Total Protein: 7.6 g/dL (ref 6.0–8.3)

## 2023-09-14 LAB — VITAMIN D 25 HYDROXY (VIT D DEFICIENCY, FRACTURES): VITD: 49.29 ng/mL (ref 30.00–100.00)

## 2023-09-14 MED ORDER — ZOLPIDEM TARTRATE 5 MG PO TABS: 2.5000 mg | ORAL_TABLET | Freq: Every evening | ORAL | 0 refills | Status: DC | PRN

## 2023-09-14 NOTE — Progress Notes (Signed)
  Subjective:  Patient ID: [name], female    DOB: [birth year],  MRN: [id number]  Chief Complaint  Patient presents with   Nail Problem    Patient is here to get her toe nails trimmed and bilateral feet looked at. Patient states she has some swelling to bilateral feet and it will go away in 6 months     73 y.o. female presents with the above complaint. History confirmed with patient.  Doing okay has not noticed recurrence of any ulcerations.  She is planning to move back to the area from [place] on the long-term.  She has as well thickening and elongation of the nails with yellow discoloration they are unable to cut them effectively, t prior debridements have been helpful  Objective:  Physical Exam: Edema has improved quite a bit.  Skin temperature and texture is normal, pulses are palpable.  Yellowed elongated thickened nails subungual debris and dystrophy x 10.  No active ulceration or signs of infection Assessment:   1. Pain due to onychomycosis of toenails of both feet        Plan:  Patient was evaluated and treated and all questions answered.  Discussed the etiology and treatment options for the condition in detail with the patient. Recommended debridement of the nails today. Sharp and mechanical debridement performed of all painful and mycotic nails today. Nails debrided in length and thickness using a nail nipper to level of comfort. Discussed treatment options including appropriate shoe gear. Follow up as needed for painful nails.  No ulcerations.  Continue daily foot inspections.  Return in about 12 weeks (around 12/06/2023) for at risk foot care.

## 2023-09-14 NOTE — Patient Instructions (Signed)
 Good to see you today . Updated labs.  Future orders for urine and culture if needed. ( Call ahead )   Good to keep [place] updated.

## 2023-09-14 NOTE — Telephone Encounter (Signed)
 Pt was seen today and had her Right ear cleaned.

## 2023-09-15 ENCOUNTER — Other Ambulatory Visit: Payer: Self-pay

## 2023-09-15 ENCOUNTER — Ambulatory Visit: Attending: Physical Medicine and Rehabilitation | Admitting: Physical Therapy

## 2023-09-15 ENCOUNTER — Ambulatory Visit: Admitting: Occupational Therapy

## 2023-09-15 ENCOUNTER — Encounter: Payer: Self-pay | Admitting: Internal Medicine

## 2023-09-15 DIAGNOSIS — G8253 Quadriplegia, C5-C7 complete: Secondary | ICD-10-CM | POA: Diagnosis present

## 2023-09-15 DIAGNOSIS — R29818 Other symptoms and signs involving the nervous system: Secondary | ICD-10-CM | POA: Insufficient documentation

## 2023-09-15 DIAGNOSIS — R7989 Other specified abnormal findings of blood chemistry: Secondary | ICD-10-CM

## 2023-09-15 DIAGNOSIS — R29898 Other symptoms and signs involving the musculoskeletal system: Secondary | ICD-10-CM | POA: Diagnosis not present

## 2023-09-15 DIAGNOSIS — R278 Other lack of coordination: Secondary | ICD-10-CM | POA: Insufficient documentation

## 2023-09-15 DIAGNOSIS — G8254 Quadriplegia, C5-C7 incomplete: Secondary | ICD-10-CM | POA: Diagnosis not present

## 2023-09-15 DIAGNOSIS — M25641 Stiffness of right hand, not elsewhere classified: Secondary | ICD-10-CM | POA: Insufficient documentation

## 2023-09-15 DIAGNOSIS — M24542 Contracture, left hand: Secondary | ICD-10-CM | POA: Diagnosis not present

## 2023-09-15 DIAGNOSIS — R2689 Other abnormalities of gait and mobility: Secondary | ICD-10-CM | POA: Diagnosis not present

## 2023-09-15 DIAGNOSIS — M6281 Muscle weakness (generalized): Secondary | ICD-10-CM | POA: Diagnosis not present

## 2023-09-15 DIAGNOSIS — R208 Other disturbances of skin sensation: Secondary | ICD-10-CM | POA: Diagnosis present

## 2023-09-15 DIAGNOSIS — M24541 Contracture, right hand: Secondary | ICD-10-CM | POA: Insufficient documentation

## 2023-09-15 DIAGNOSIS — R293 Abnormal posture: Secondary | ICD-10-CM | POA: Diagnosis not present

## 2023-09-15 DIAGNOSIS — G8250 Quadriplegia, unspecified: Secondary | ICD-10-CM | POA: Diagnosis not present

## 2023-09-15 DIAGNOSIS — L89323 Pressure ulcer of left buttock, stage 3: Secondary | ICD-10-CM | POA: Diagnosis not present

## 2023-09-15 LAB — IRON,TIBC AND FERRITIN PANEL
%SAT: 17 % (ref 16–45)
Ferritin: 531 ng/mL — ABNORMAL HIGH (ref 16–288)
Iron: 41 ug/dL — ABNORMAL LOW (ref 45–160)
TIBC: 241 ug/dL — ABNORMAL LOW (ref 250–450)

## 2023-09-15 NOTE — Therapy (Signed)
 OUTPATIENT PHYSICAL THERAPY NEURO TREATMENT Patient Name: [name] MRN: [id number] DOB:1951/05/13, 73 y.o., female Today's Date: 09/15/2023   PCP: Carmen Headings, MD REFERRING PROVIDER: [name], MD  END OF SESSION:  PT End of Session - 09/15/23 0932     Visit Number 2    Number of Visits 17   with eval   Date for PT Re-Evaluation 11/18/23   to allow for scheduling delays   Authorization Type [employer] MEDICARE    PT Start Time 0932   from OT session   PT Stop Time 1015    PT Time Calculation (min) 43 min    Equipment Utilized During Treatment Gait belt    Activity Tolerance Patient tolerated treatment well    Behavior During Therapy [facility] for tasks assessed/performed              Past Medical History:  Diagnosis Date   CERVICAL POLYP 03/11/2008   Qualifier: Diagnosis of  By: Carmen Sharp MD, [name]    Colon polyps 2005   on colonscopy [name]   Fibroid 2004   Per Dr. Dareen [name]   History of shingles    face and mouth   Hx of skin cancer, basal cell    Rosacea    Sciatica of left side 09/28/2013   Scoliosis    noted on mri done for back pain   Past Surgical History:  Procedure Laterality Date   BUNIONECTOMY     Patient Active Problem List   Diagnosis Date Noted   Buttock wound, left, subsequent encounter 03/16/2023   Bronchiectasis with acute exacerbation (HCC) 03/15/2023   Buttock wound, left, initial encounter 11/02/2022   Orthostatic hypotension 08/13/2022   Neurogenic bowel 05/03/2022   Spasticity 05/03/2022   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/03/2022   Nerve pain 05/03/2022   Medication monitoring encounter 01/08/2022   Neurogenic bladder 10/11/2021   Urinary incontinence 10/11/2021   ESBL (extended spectrum beta-lactamase) producing bacteria infection 10/09/2021   Recurrent UTI 10/09/2021   Quadriplegia, C5-C7 incomplete (HCC) 01/16/2021   History of spinal fracture 01/16/2021   Suprapubic catheter (HCC) 01/16/2021   Encounter for routine  gynecological examination 09/28/2013   Onychomycosis 09/28/2013   Foot deformity, acquired 03/26/2012   Encounter for preventive health examination 12/25/2010   ROSACEA 08/25/2009   Disturbance in sleep behavior 03/11/2008   SKIN CANCER, HX OF 03/11/2008   DYSURIA, HX OF 03/11/2008   Hyperlipidemia 02/10/2007   CERVICALGIA 02/10/2007    ONSET DATE: 08/29/2023 (referral date)  REFERRING DIAG: G82.54 (ICD-10-CM) - Incomplete quadriplegia at C5-C8 level (HCC)  THERAPY DIAG:  Muscle weakness (generalized)  Other symptoms and signs involving the musculoskeletal system  Other symptoms and signs involving the nervous system  Quadriplegia, C5-C7 incomplete (HCC)  Abnormal posture  Other abnormalities of gait and mobility  Rationale for Evaluation and Treatment: Rehabilitation  SUBJECTIVE:  SUBJECTIVE STATEMENT: Pt received seated in her PWC from OT session. No acute changes since last visit.  From initial eval: Pt familiar to this clinic, last seen Oct-Nov 2024 but has been seen for multiple POCs since her initial injury in 2022. Pt returns to this clinic after being in [place] for the past few months. While in [place] in December 2024 patient had a fall where she slid forwards out of her wheelchair onto the floor, ended up fracturing both of her femurs. Pt was hospitalized for 10-11 days and had surgical repair of her femurs. Pt reports she has been cleared of all restrictions since surgery, does have ongoing swelling in both legs and worsened spasticity. Pt reports that the spasticity has slightly improved since it initially started, was told by [name] the swelling may not resolve for 6-8 months (it has been 3 months) and not sure if spasticity will resolve but patient is hopeful that if her  swelling improves her spasticity will improve as well.  Pt asking about other options to help manage the swelling her legs, has tried variable compression stockings (knee high) and her PTs in [place] recommended tight shapewear. Pt reports that the knee-high compression stockings led to increased swelling in her knees and the shapewear did not help her swelling. Encouraged patient to get thigh-high compression stockings and elevate her LE in PWC. Pt has ordered an articulating bed that will get here next Monday (3/31) so she can better elevate her legs when in bed.  Pt is also concerned about decreased ROM in her legs, [name] works on stretching her legs frequently but she feels she has more motion in her LLE as compared to RLE and may have mild foot drop on her R side. Pt does have PRAFOs to wear at night. Pt has worked up to standing in her standing frame x 30-40 min at a time. Pt also worked on standing with her PT and in // bars in [place]. Pt also reports with her injuries she lost the ability to lock/unlock her R knee but that it is getting better. She reports she lost a lot of stamina during her hospital stay as well.  Pt also has a new wound since last seen in this clinic in her L gluteal fold, shearing injury. Pt was seeing wound care in [place] and is scheduled to see wound care with [facility] early April (was not able to schedule with [facility] wound care until late April).  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 Yes: NPRS scale: 3-4 Pain location: elbows to fingertips on both arms Pain description: nerve pain Aggravating factors: not stated Relieving factors: not stated  PRECAUTIONS: Fall and Other: osteoporosis  RED FLAGS: None   WEIGHT BEARING RESTRICTIONS: No  FALLS: Has patient fallen in last 6 months? Yes. Number of falls 1 fall in [place] that  resulted in B femur fractures  LIVING ENVIRONMENT: Lives with: lives with their spouse and and with full-time caregiver [name] Lives in: House/apartment Home is power wheelchair accessible Has following equipment at home: Wheelchair (power), Wheelchair (manual), Grab bars, Ramped entry, and standing frame, slide board  PLOF: Independent with household mobility with device, Independent with community mobility with device, Requires assistive device for independence, Needs assistance with ADLs, and Needs assistance with transfers  PATIENT GOALS: "still working on stand and pivot with goal  to pivot to commode or to a chair" "work on core-will help me with standing" "improve my stamina - being in the hospital I lost strength/endurance"   OBJECTIVE:  Note: Objective measures were completed at Evaluation unless otherwise noted.  DIAGNOSTIC FINDINGS: None update/relevant to this POC  COGNITION: Overall cognitive status: Within functional limits for tasks assessed   SENSATION: Decreased sensation in BUE and BLE secondary to incomplete quadriplegia Decreased sensation in proximal LLE as compared to distal LE  EDEMA:  Circumferential: R knee: 17"; L knee: 17.5" and Figure 8: R ankle 21", L ankle 21.5"  MUSCLE TONE: increased spasticity in BLE   POSTURE: rounded shoulders and forward head  LOWER EXTREMITY ROM:     Passive  Right Eval Left Eval  Hip flexion Tight hip flexors Tight hip flexors  Hip extension    Hip abduction    Hip adduction    Hip internal rotation    Hip external rotation    Knee flexion Tight HS Tight HS  Knee extension    Ankle dorsiflexion Decreased, tight gastroc Decreased, tight gastroc  Ankle plantarflexion    Ankle inversion    Ankle eversion     (Blank rows = not tested)  LOWER EXTREMITY MMT:    MMT Right Eval Left Eval  Hip flexion 1 2-  Hip extension    Hip abduction    Hip adduction    Hip internal rotation    Hip external rotation    Knee  flexion 0 3  Knee extension 2- 2-  Ankle dorsiflexion 2- 3  Ankle plantarflexion    Ankle inversion    Ankle eversion    (Blank rows = not tested)  BED MOBILITY:  From previous POC: Sit to supine Mod A Supine to sit Mod A Rolling to Right Mod A Rolling to Left Mod A Undulating mattress for wound management on standard bed (elevated-so often doing uphill sliding board transfers); she would like to continue working on sitting up independently, she has been working on rolling, needs less assistance w/ this when someone props her leg into hooklying; would like something to help her pull her left leg to her butt for stretching as well as bed mobility.  TRANSFERS: From previous POC: Pt continues using combination of bump over, slide board, and depression (squat pivot) transfers.                                                                                                                              TREATMENT:   TherEx Pt and caregiver requesting to review PROM of BLE. SB transfer PWC to level mat table with min A. Sit to supine mod A for BLE management. Review of BLE PROM: LLE Hip flexion WFL Hip IR/ER tight, discussed how to safely stretch and progress ROM Knee flex/ext WFL, hamstrings with good mobility Ankle DF/PF with focus on gastroc stretch Ankle inversion/eversion demonstration Butterfly stretch with gentle overpressure RLE Hip flexion [facility]  Hip IR/ER tight Knee extension WFL with good mobility in hamstrings Knee flex slightly limited due to tightness in quads Ankle DF/PF with tightness in gastroc Ankle inversion/eversion Butterfly stretch with gentle overpressure, tighter than L side  Overall patient exhibits decreased PROM in her RLE as compared to her LLE, [name] (caregiver) with a good understanding of how to safely progress patient's PROM in BLE following session.    NMR To work on functional strengthening of BLE and assessment of sit to stand: Sit to stand in  stedy from elevated mat table, max A Sit to stand from perched stedy seat, min A Pt initially requires up to mod A for hip extension but progresses to min A Pt able to stand x 24.75 sec at the most before needing a seated rest break   PATIENT EDUCATION: Education details: PROM in BLE (see above) Person educated: Patient and Arts administrator Education method: Explanation Education comprehension: verbalized understanding  HOME EXERCISE PROGRAM: Will be established as needed as pt has done continuous therapy and is working towards functional tasks.   GOALS: Goals reviewed with patient? Yes  SHORT TERM GOALS: Target date: 10/07/2023  HEP to be established for stretching and strengthening as appropriate. Baseline: not established at initial eval Goal status: INITIAL  2.  Pt will perform sit to stand transfer with LRAD with mod A Baseline: max A to stedy (4/4) Goal status: INITIAL  3.  Pt to tolerate standing x 5 min with LRAD to demonstrate improved endurance Baseline:  Goal status: INITIAL  4.  Pt to demonstrate reduced edema in BLE with a reduction in circumference/figure 8 measurement by 0.5" from initial evaluation. Baseline: Circumferential: R knee: 17"; L knee: 17.5" and Figure 8: R ankle 21", L ankle 21.5" Goal status: INITIAL   LONG TERM GOALS: Target date: 11/04/2023    Patient and her caregiver to be independent with performance of HEP for stretching and strengthening as appropriate. Baseline: not established at initial eval Goal status: INITIAL  2.  Pt will perform sit to stand transfer with LRAD with min A Baseline: max A to stedy (4/4) Goal status: INITIAL  3.  Pt to tolerate standing x 10 min with LRAD to demonstrate improved endurance Baseline:  Goal status: INITIAL  4.  Pt to demonstrate reduced edema in BLE with a reduction in circumference/figure 8 measurement by 1" from initial evaluation. Baseline: Circumferential: R knee: 17"; L knee: 17.5" and  Figure 8: R ankle 21", L ankle 21.5" Goal status: INITIAL    ASSESSMENT:  CLINICAL IMPRESSION: Emphasis of skilled PT session on reviewing PROM of BLE as patient and her caregiver are requesting a review of this. Patient noted to have decreased ROM in her RLE as compared to her LLE, reviewed how to safely stretch hip, knee, and ankle joints of her BLE. Remainder of session focused on assessing standing to stedy, pt requires up to max A to stand from elevated mat table to stedy. Patient continues to benefit from skilled PT services to work towards LTGs. Continue POC.    OBJECTIVE IMPAIRMENTS: decreased balance, decreased endurance, decreased mobility, difficulty walking, decreased ROM, decreased strength, increased edema, impaired perceived functional ability, increased muscle spasms, impaired flexibility, impaired sensation, impaired tone, impaired UE functional use, postural dysfunction, and pain.   ACTIVITY LIMITATIONS: carrying, lifting, bending, standing, stairs, transfers, bed mobility, continence, bathing, toileting, dressing, reach over head, and hygiene/grooming  PARTICIPATION LIMITATIONS: meal prep, cleaning, laundry, driving, shopping, community activity, and occupation  PERSONAL FACTORS: Age, Sex,  Time since onset of injury/illness/exacerbation, and 1-2 comorbidities:    C7 ASIA C- incomplete quad w/ neurogenic bladder and bowel, HLD, Hx of skin cancerare also affecting patient's functional outcome.   REHAB POTENTIAL: Good  CLINICAL DECISION MAKING: Stable/uncomplicated  EVALUATION COMPLEXITY: High  PLAN:  PT FREQUENCY: 2x/week  PT DURATION: 8 weeks  PLANNED INTERVENTIONS: 97164- PT Re-evaluation, 97110-Therapeutic exercises, 97530- Therapeutic activity, 97112- Neuromuscular re-education, 97535- Self Care, 13086- Manual therapy, [phone]- Gait training, [phone]- Orthotic Fit/training, [phone]- Electrical stimulation (manual), Patient/Family education, Balance training, Stair  training, Taping, Dry Needling, Joint mobilization, Scar mobilization, Compression bandaging, DME instructions, Wheelchair mobility training, Cryotherapy, and Moist heat  PLAN FOR NEXT SESSION: any questions over PROM/stretching? sit to stands, standing tolerance with stedy or possibly with RW, core strengthening/stability, endurance; wants to have conversation about taking a break (3-6 months) after this POC near end of this POC  Peter Congo, PT Peter Congo, PT, DPT, CSRS  09/15/2023, 10:15 AM
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
## 2023-09-15 NOTE — Progress Notes (Signed)
 Cbc normal no anemia  iron saturation low normal ( ferritin can be up from inflammation)  but  can be followed   kidney function gfr nl .  The only  result that needs follow is the ast alt ( liver enzymes) that are borderline up. Can be medication such as tylenol advil like products , or temporary   illness excess iron  supplements but often resolve   on its own .   Advise lfts panel in 1-2 months  with ferritin ibc panel  ( dont have to fast ) and go from there  . (Dx abn lfts)

## 2023-09-17 IMAGING — MG MM DIGITAL SCREENING BILAT W/ TOMO AND CAD
8 series · 8 of 24 positions shown · non-contrast
Comparison: Previous exam(s).

CLINICAL DATA: Screening.

EXAM:
DIGITAL SCREENING BILATERAL MAMMOGRAM WITH TOMOSYNTHESIS AND CAD
TECHNIQUE: Bilateral screening digital craniocaudal and mediolateral oblique
mammograms were obtained. Bilateral screening digital breast
tomosynthesis was performed. The images were evaluated with
computer-aided detection.

[R MLO synth-2D]
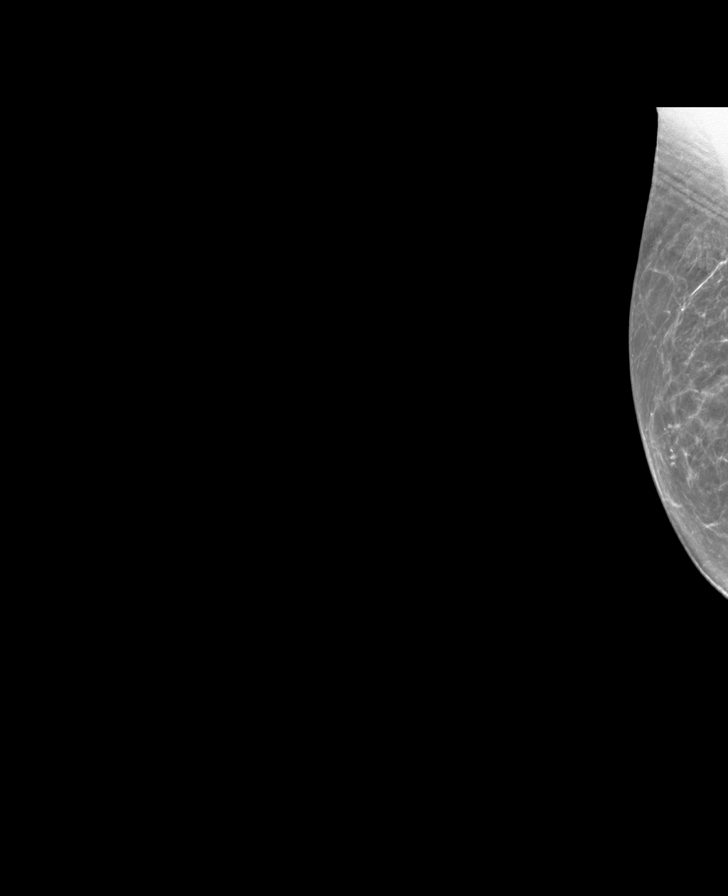

[L CC synth-2D]
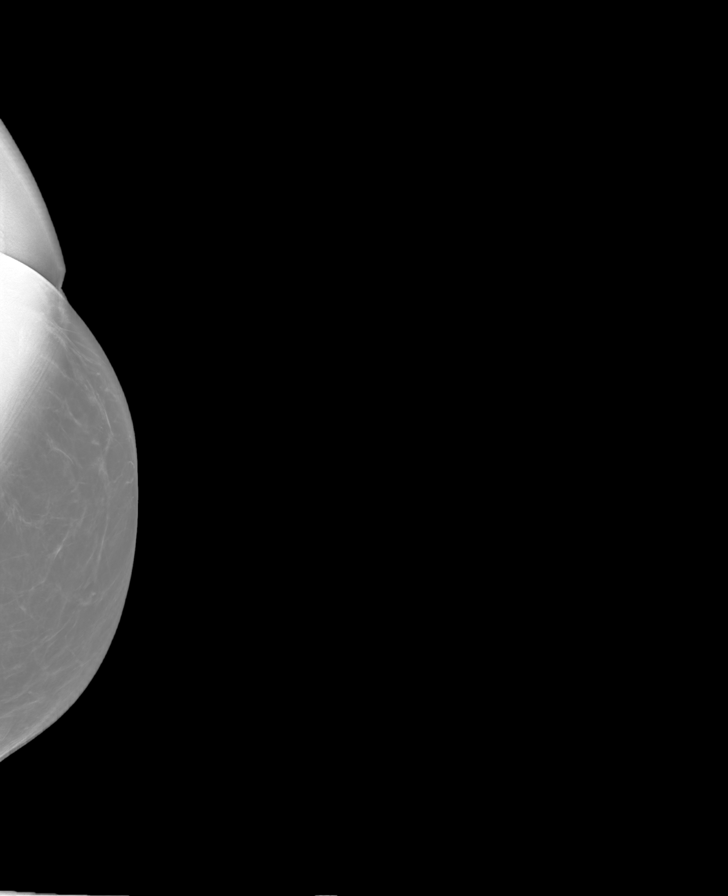

[L MLO synth-2D]
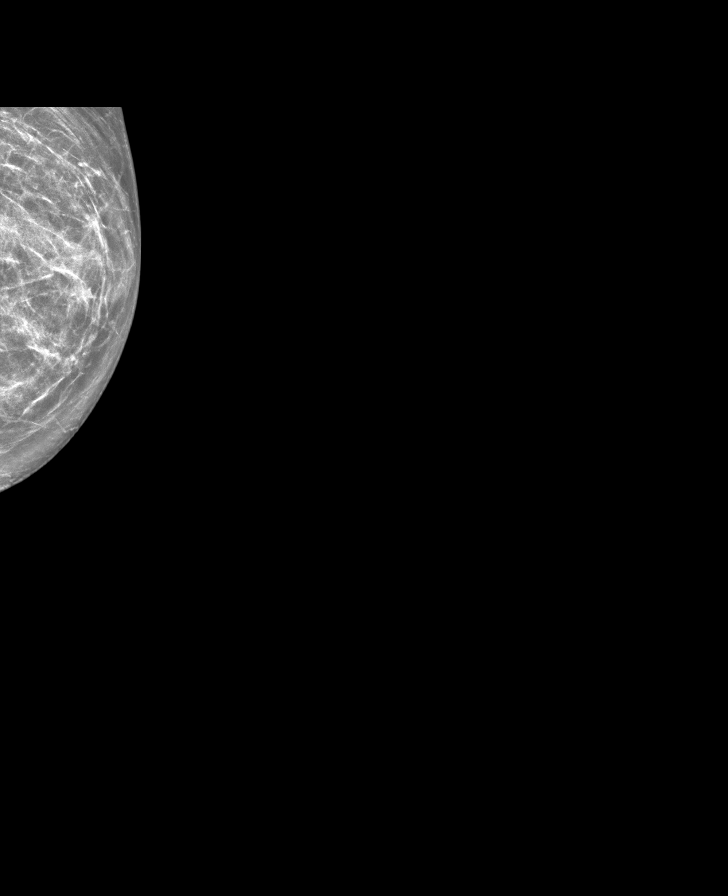

[R CC synth-2D]
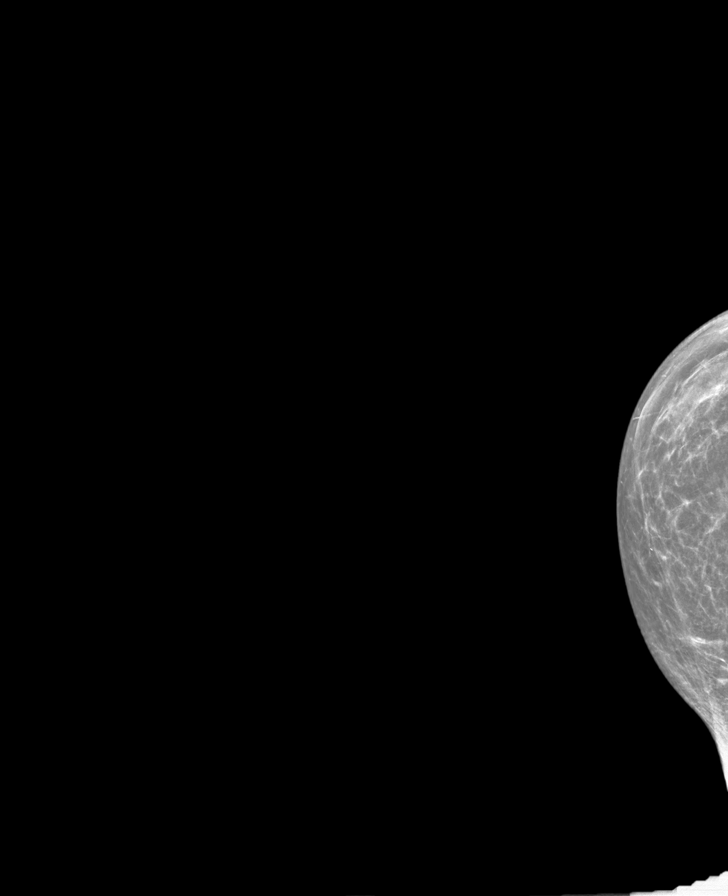

[R MLO tomo · tomo slice 25/49.0]
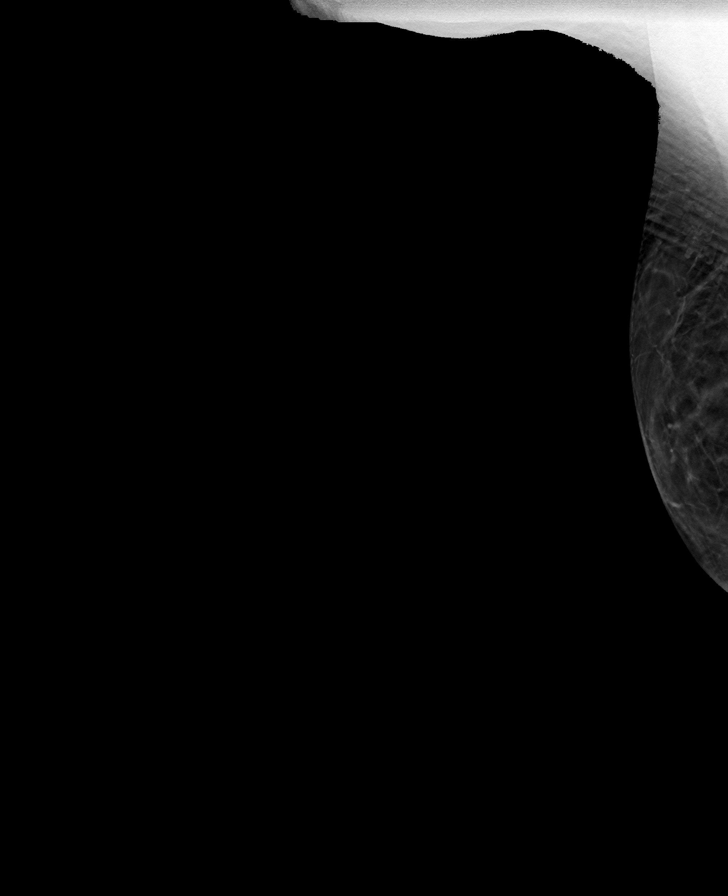

[L CC tomo · tomo slice 29/57.0]
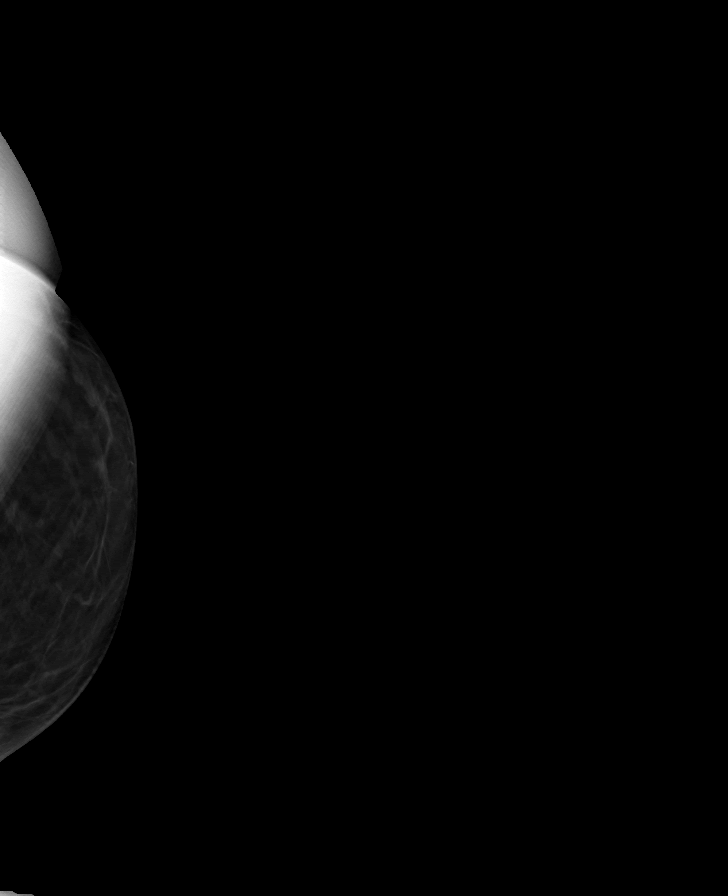

[L MLO tomo · tomo slice 31/62.0]
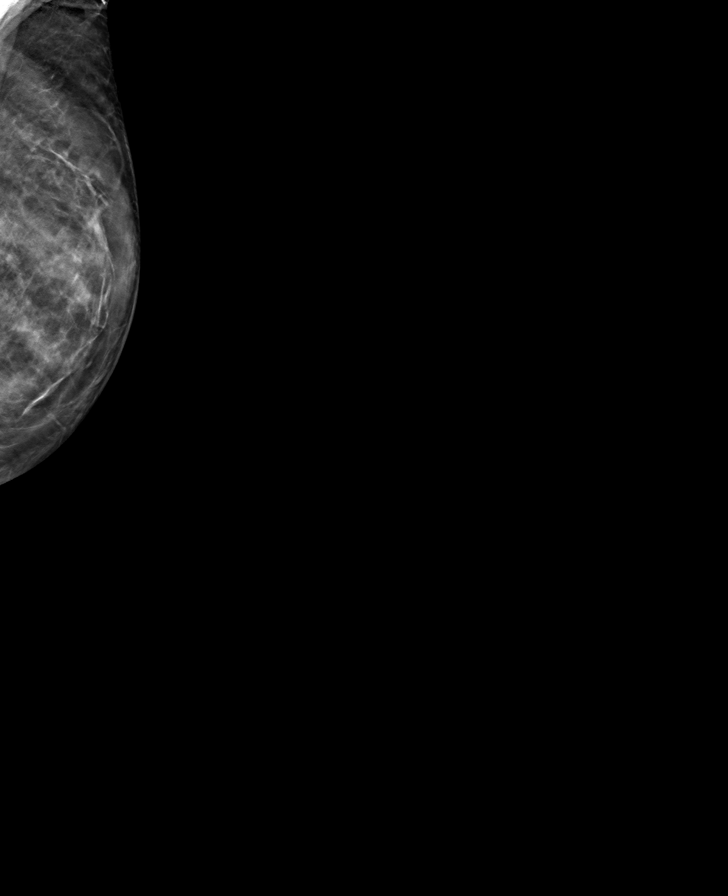

[R CC tomo · tomo slice 29/57.0]
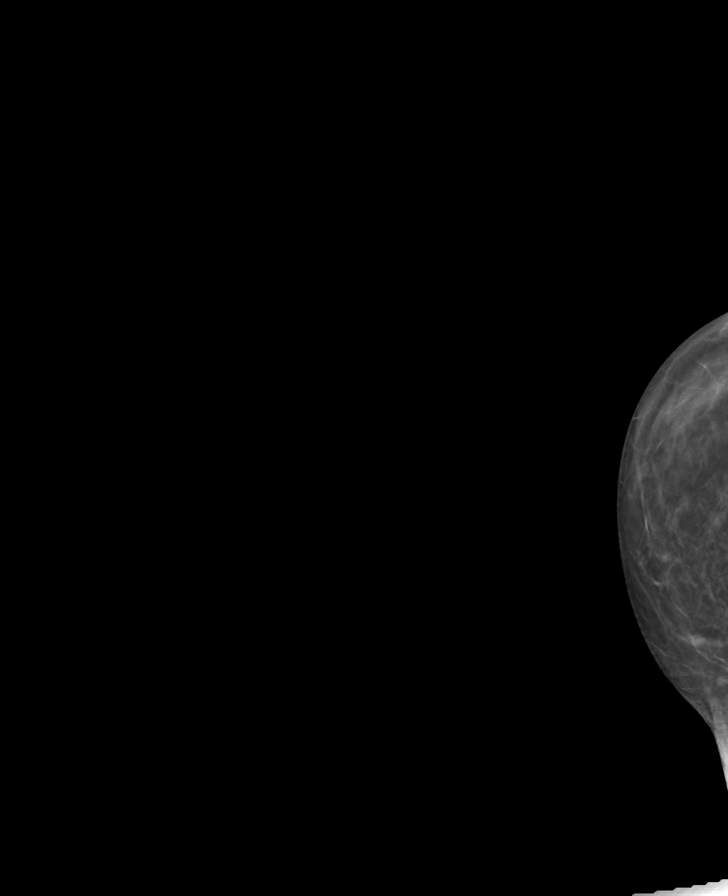

[8 of 24 positions shown; findings below may reference images not displayed]

ACR Breast Density Category b: There are scattered areas of
fibroglandular density.
FINDINGS: There are no findings suspicious for malignancy.
IMPRESSION: No mammographic evidence of malignancy. A result letter of this
screening mammogram will be mailed directly to the patient.

RECOMMENDATION:
Screening mammogram in one year. (Code:51-O-LD2)

BI-RADS CATEGORY  1: Negative.

## 2023-09-19 ENCOUNTER — Encounter: Payer: Self-pay | Admitting: Physical Medicine and Rehabilitation

## 2023-09-19 ENCOUNTER — Other Ambulatory Visit: Payer: Self-pay | Admitting: Emergency Medicine

## 2023-09-19 ENCOUNTER — Encounter: Attending: Physical Medicine and Rehabilitation | Admitting: Physical Medicine and Rehabilitation

## 2023-09-19 ENCOUNTER — Other Ambulatory Visit (INDEPENDENT_AMBULATORY_CARE_PROVIDER_SITE_OTHER)

## 2023-09-19 ENCOUNTER — Other Ambulatory Visit: Payer: Self-pay

## 2023-09-19 VITALS — BP 117/72 | HR 68 | Temp 98.9°F | Ht 64.0 in

## 2023-09-19 DIAGNOSIS — R252 Cramp and spasm: Secondary | ICD-10-CM | POA: Diagnosis not present

## 2023-09-19 DIAGNOSIS — G8254 Quadriplegia, C5-C7 incomplete: Secondary | ICD-10-CM | POA: Diagnosis not present

## 2023-09-19 DIAGNOSIS — Z960 Presence of urogenital implants: Secondary | ICD-10-CM

## 2023-09-19 DIAGNOSIS — R8290 Unspecified abnormal findings in urine: Secondary | ICD-10-CM

## 2023-09-19 LAB — URINALYSIS, ROUTINE W REFLEX MICROSCOPIC
Bilirubin Urine: NEGATIVE
Hgb urine dipstick: NEGATIVE
Ketones, ur: NEGATIVE
Nitrite: POSITIVE — AB
RBC / HPF: NONE SEEN (ref 0–?)
Specific Gravity, Urine: 1.015 (ref 1.000–1.030)
Total Protein, Urine: NEGATIVE
Urine Glucose: NEGATIVE
Urobilinogen, UA: 0.2 (ref 0.0–1.0)
pH: 8 (ref 5.0–8.0)

## 2023-09-19 NOTE — Patient Instructions (Signed)
 Schedule appointment for botox into your bilateral upper extremities un 2 weeks; go to therapy and follow up for future injection adjustment/planning 6-8 weeks later
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
## 2023-09-20 ENCOUNTER — Ambulatory Visit: Admitting: Occupational Therapy

## 2023-09-20 ENCOUNTER — Ambulatory Visit: Admitting: Physical Therapy

## 2023-09-20 DIAGNOSIS — R29898 Other symptoms and signs involving the musculoskeletal system: Secondary | ICD-10-CM

## 2023-09-20 DIAGNOSIS — R278 Other lack of coordination: Secondary | ICD-10-CM

## 2023-09-20 DIAGNOSIS — M6281 Muscle weakness (generalized): Secondary | ICD-10-CM

## 2023-09-20 DIAGNOSIS — G8254 Quadriplegia, C5-C7 incomplete: Secondary | ICD-10-CM | POA: Diagnosis not present

## 2023-09-20 DIAGNOSIS — M24541 Contracture, right hand: Secondary | ICD-10-CM | POA: Diagnosis not present

## 2023-09-20 DIAGNOSIS — R29818 Other symptoms and signs involving the nervous system: Secondary | ICD-10-CM

## 2023-09-20 DIAGNOSIS — M24542 Contracture, left hand: Secondary | ICD-10-CM | POA: Diagnosis not present

## 2023-09-20 DIAGNOSIS — R2689 Other abnormalities of gait and mobility: Secondary | ICD-10-CM | POA: Diagnosis not present

## 2023-09-20 DIAGNOSIS — R293 Abnormal posture: Secondary | ICD-10-CM

## 2023-09-20 NOTE — Therapy (Signed)
 OUTPATIENT OCCUPATIONAL THERAPY NEURO TREATMENT  Patient Name: [name] MRN: [id number] DOB:July 04, 1950, 73 y.o., female Today's Date: 09/20/2023  PCP: [name], MD  REFERRING PROVIDER: [name], MD  END OF SESSION:  OT End of Session - 09/20/23 0930     Visit Number 3    Number of Visits 17    Date for OT Re-Evaluation 11/04/23    Authorization Type [employer] - requires auth    OT Start Time [id number]    OT Stop Time 1015    OT Time Calculation (min) 44 min    Activity Tolerance Patient tolerated treatment well    Behavior During Therapy [facility] for tasks assessed/performed            Past Medical History:  Diagnosis Date   CERVICAL POLYP 03/11/2008   Qualifier: Diagnosis of  By: [name] MD, [name]    Colon polyps 2005   on colonscopy [name]   Fibroid 2004   Per Dr. [name]   History of shingles    face and mouth   Hx of skin cancer, basal cell    Rosacea    Sciatica of left side 09/28/2013   Scoliosis    noted on mri done for back pain   Past Surgical History:  Procedure Laterality Date   BUNIONECTOMY     Patient Active Problem List   Diagnosis Date Noted   Buttock wound, left, subsequent encounter 03/16/2023   Bronchiectasis with acute exacerbation (HCC) 03/15/2023   Buttock wound, left, initial encounter 11/02/2022   Orthostatic hypotension 08/13/2022   Neurogenic bowel 05/03/2022   Spasticity 05/03/2022   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/03/2022   Nerve pain 05/03/2022   Medication monitoring encounter 01/08/2022   Neurogenic bladder 10/11/2021   Urinary incontinence 10/11/2021   ESBL (extended spectrum beta-lactamase) producing bacteria infection 10/09/2021   Recurrent UTI 10/09/2021   Quadriplegia, C5-C7 incomplete (HCC) 01/16/2021   History of spinal fracture 01/16/2021   Suprapubic catheter (HCC) 01/16/2021   Encounter for routine gynecological examination 09/28/2013   Onychomycosis 09/28/2013   Foot deformity, acquired 03/26/2012    Encounter for preventive health examination 12/25/2010   ROSACEA 08/25/2009   Disturbance in sleep behavior 03/11/2008   SKIN CANCER, HX OF 03/11/2008   DYSURIA, HX OF 03/11/2008   Hyperlipidemia 02/10/2007   CERVICALGIA 02/10/2007    ONSET DATE: 07/28/2020  Date of Referral 08/29/2023   REFERRING DIAG: G82.54 (ICD-10-CM) - Quadriplegia, C5-C8, incomplete  THERAPY DIAG:  Muscle weakness (generalized)  Other lack of coordination  Other symptoms and signs involving the musculoskeletal system  Other symptoms and signs involving the nervous system  Rationale for Evaluation and Treatment: Rehabilitation  SUBJECTIVE:   SUBJECTIVE STATEMENT: Pt had her Botox consultation and it was recommended that she have 3 months of therapy following her injections.   Pt did bring her knife and B splints today for adjustments.    Pt accompanied by:  Live in Caregiver - [name]   PERTINENT HISTORY: "Pt is a 73 yr old L handed female with hx of incomplete quadriplegia- 2/14 2022- fleeing the police in [place] on passenger 100 (high speed) miles/hour,  [name]; neurogenic bowel and bladder and spasticity; [name], [name] Here for f/u on Incomplete quadriplegia"  B femur fractures December, 2024.  PRECAUTIONS: Fall; suprapubic catheter (she wants to get this removed meaning she needs to get to and from the toilet); she has had minor heat sensation when needing to complete her bowel  program-possible AD?   WEIGHT BEARING RESTRICTIONS: No  PAIN: - reports average pain as noted below. Will notify therapist if there are changes in her pain.  Are you having pain? Yes: NPRS scale: 3/10 Pain location: fingers to elbow bilaterally Pain description: constant Aggravating factors: it can increased over time ie) is worse at the end of the day.  Also cold affects cramps and function. Relieving factors: gabapentin and baclofen for spasms, nightly stretching  FALLS: Has patient fallen  in last 6 months? Yes. Number of falls 1  LIVING ENVIRONMENT: Lives with: lives with their family - husband [name] and with an adult companion s/p moving back up from [place] x10 months Lives in: House/apartment Stairs:  4 story town house with an Engineer, structural with threshold adjustments, roll in shower with transport chair Has following equipment at home: Wheelchair (power) - with seat height adjustments to access counters and reclining option, Wheelchair (manual), transport WC, shower chair, and Ramped entry, handheld showerhead with rails around toilet, had Michiel Sites but is no longer in need of it, has slide boards x3  PLOF: Requires assistive device for independence, Needs assistance with ADLs, Needs assistance with homemaking, Needs assistance with gait, and Needs assistance with transfers; 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  Used to like to knit, sew and bake.  PATIENT GOALS: improve spasticity and use of hands  OBJECTIVE:   HAND DOMINANCE: Left  ADLs: Overall ADLs: Patient has a live in caregiver  Transfers/ambulation related to ADLs: min assist with sliding board transfers.  Eating: Has a rocker knife that she can use. Used to use adapted utensils but now uses regular utensils but still will get assistance to cut food ie) when eating out.  Grooming: can brush her own hair with LUE only; unable to manage jewelry ie) earrings  UB Dressing: can zip/unzip after it has been started, unable to manage buttons herself, Caregiver assists but if she has extra time, she can put on her bra, and a loose fitting pullover shirt/t-shirt  LB Dressing: dependent for LB dressing in bed and with special sock donner for LE compression garments   Toileting: bladder trained with suprapubic catheter which she clamps off.  Dependent for bowel incontinence care.  Bathing: Sponge bath with adult washclothes.  Can bathe UB with back scrubber for most of her back.  Needs help with feet (mentioned  she might need a separate brush for feet)   Tub Shower transfers: Min assist with slide board to wheel in shower chair  Equipment: Shower seat with back, Walk in shower, bed side commode, Reacher, Sock aid, Long handled sponge, and Feeding equipment  IADLs: --  Shopping: Assisted by caregiver  Light housekeeping: Has housekeeper that comes monthly  Meal Prep: previously enjoyed baking. Assisted by caregiver but has reheated a meal for herself after getting food out of the fridge/freezer from her WC.  Community mobility: Dependent  Medication management: Caregiver sorts them into pillbox but she is very aware of her medications   Financial management: Patient manages her own finances  Handwriting: Increased time and has a pen with a little grip  MOBILITY STATUS: Independent with power mobility  ACTIVITY TOLERANCE: Activity tolerance: good to Fair - MMT WFL but has limited sustained tolerance for ongoing use of Ues with poor trunk control  FUNCTIONAL OUTCOME MEASURES:  PSFS: 3.3 total score   Total score = sum of the activity scores/number of activities Minimum detectable change (90%CI) for average score = 2 points  Minimum detectable change (90%CI) for single activity score = 3 points   UPPER EXTREMITY ROM:   AROM - WFL without obvious contractures, some digital flexion noted but PROM WNL   UPPER EXTREMITY MMT:   Grossly WFL - Endurance limited R tricep strength > than L but L UE generally stronger than R UE  MMT Right (eval) Left (eval)  Shoulder flexion 4/5 4/5  Shoulder abduction 4/5 4/5  Elbow flexion 4/5 4/5  Elbow extension 4/5 4/5  (Blank rows = not tested)  HAND FUNCTION: Grip strength: Right: 4.4 lbs (decline) ; Left: 18 lbs (slight improvement)  COORDINATION: To be assessed  SENSATION: Light touch: Impaired  - patient   EDEMA: NA for UEs but LE has poor lymph drainage with custom compression garments   MUSCLE TONE: Generally WFL    COGNITION: Overall cognitive status: Within functional limits for tasks assessed  VISION: Subjective report: Patent wears progressive lens/glasses.  Denies diplopia or vision changes. Baseline vision: Wears glasses all the time  VISION ASSESSMENT: WFL  OBSERVATIONS: Patient independent with power WC navigation within clinic.  Patient is well-kept with foley catheter in place.  She has slight limitations in full extension of digits but PROM is WNL.    TODAY'S TREATMENT:                                                                                            Self Care:  OT applied silicone wrap around rocker knife handle and instructed pt on more gross grasp for simulated cutting with red putty.   OT replaced pt's L resting hand splint straps, velcro, and padding and cleaned inside of splint out. My need to adjust during upcoming visits.   PATIENT EDUCATION: Education details: Youth worker and splint adjustment Person educated: Patient and Software engineer in caregiver - Programmer, systems Education method: Explanation, Demonstration, and Verbal cues Education comprehension: verbalized understanding, returned demonstration, verbal cues required, and needs further education  HOME EXERCISE PROGRAM: None issued at evaluation   GOALS:   SHORT TERM GOALS: Target date: 10/07/2023   1. Patient will verbalize understanding of AE/modified techniques to improve independence and safety with ADL and IADL completion. Baseline: Caregiver/spouse assist Goal status: IN PROGRESS  2.  Pt will be independent with BUE braces/splints as needed to prevent contracture and improve functional use of hands.  Baseline: Caregiver/spouse assist Goal status: IN Progress  LONG TERM GOALS: Target date: 11/04/2023  Patient will demonstrate independence with updated HEP for UE strengthening, coordination and ROM to prevent contractures and maintain strength for transfers and ADLs. Baseline: Previous HEPs have been  established but need to be reviewed and updated.  Goal status: IN Progress  2.  Patient will report at least two-point increase in average PSFS score or at least three-point increase in a single activity score indicating functionally significant improvement given minimum detectable change. Baseline: 3.3 total score (See above for individual activity scores)  Goal status: IN Progress  3.  Patient will demonstrate at least 10 lbs R grip strength as needed to open jars and other containers. Baseline: 4.4 lbs Goal status: IN PROGRESS   ASSESSMENT:  CLINICAL IMPRESSION: Patient will require further assessment following Botox injections to determine if POC needs to be adjusted. Good understanding of adaptive strategies and adjustments of L resting hand splint.   PERFORMANCE DEFICITS: in functional skills including ADLs, IADLs, coordination, dexterity, strength, muscle spasms, Fine motor control, Gross motor control, continence, skin integrity, and UE functional use,   IMPAIRMENTS: are limiting patient from ADLs, IADLs, work, and leisure.   CO-MORBIDITIES: has co-morbidities such as incontinence and wound  that affects occupational performance. Patient will benefit from skilled OT to address above impairments and improve overall function.  REHAB POTENTIAL: Fair due to chronicity of injury   PLAN:  OT FREQUENCY: 1-2x/week (pt agreeable to starting off at 2xweek then tapering with the idea that she will take a break from therapy following this therapy episode.)  OT DURATION: 8 weeks  PLANNED INTERVENTIONS: self care/ADL training, therapeutic exercise, therapeutic activity, neuromuscular re-education, manual therapy, passive range of motion, balance training, functional mobility training, splinting, patient/family education, energy conservation, coping strategies training, and DME and/or AE instructions  RECOMMENDED OTHER SERVICES: Patient was seen for PT evaluation today with treatment plans  coordinated for 2x/week.  CONSULTED AND AGREED WITH PLAN OF CARE: Patient and family member/caregiver  PLAN FOR NEXT SESSION:  Adjust L hand splint as needed and revamp  R hand splint Review HEP Explore FM tasks and establish weight shifting instruction for pressure relief.   Magnetic zipper jacket and other ADL AE  Delana Meyer, OT 09/20/2023, 11:21 AM

## 2023-09-20 NOTE — Therapy (Signed)
 OUTPATIENT PHYSICAL THERAPY NEURO TREATMENT Patient Name: [name] MRN: [id number] DOB:13-Jun-1951, 73 y.o., female Today's Date: 09/20/2023   PCP: [name], MD REFERRING PROVIDER: [name], MD  END OF SESSION:  PT End of Session - 09/20/23 0847     Visit Number 3    Number of Visits 17   with eval   Date for PT Re-Evaluation 11/18/23   to allow for scheduling delays   Authorization Type [employer] MEDICARE    PT Start Time 0845    PT Stop Time 0929    PT Time Calculation (min) 44 min    Equipment Utilized During Treatment Gait belt    Activity Tolerance Patient tolerated treatment well    Behavior During Therapy [facility] for tasks assessed/performed               Past Medical History:  Diagnosis Date   CERVICAL POLYP 03/11/2008   Qualifier: Diagnosis of  By: [name] MD, [name]    Colon polyps 2005   on colonscopy [name]   Fibroid 2004   Per Dr. [name]   History of shingles    face and mouth   Hx of skin cancer, basal cell    Rosacea    Sciatica of left side 09/28/2013   Scoliosis    noted on mri done for back pain   Past Surgical History:  Procedure Laterality Date   BUNIONECTOMY     Patient Active Problem List   Diagnosis Date Noted   Buttock wound, left, subsequent encounter 03/16/2023   Bronchiectasis with acute exacerbation (HCC) 03/15/2023   Buttock wound, left, initial encounter 11/02/2022   Orthostatic hypotension 08/13/2022   Neurogenic bowel 05/03/2022   Spasticity 05/03/2022   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/03/2022   Nerve pain 05/03/2022   Medication monitoring encounter 01/08/2022   Neurogenic bladder 10/11/2021   Urinary incontinence 10/11/2021   ESBL (extended spectrum beta-lactamase) producing bacteria infection 10/09/2021   Recurrent UTI 10/09/2021   Quadriplegia, C5-C7 incomplete (HCC) 01/16/2021   History of spinal fracture 01/16/2021   Suprapubic catheter (HCC) 01/16/2021   Encounter for routine gynecological examination  09/28/2013   Onychomycosis 09/28/2013   Foot deformity, acquired 03/26/2012   Encounter for preventive health examination 12/25/2010   ROSACEA 08/25/2009   Disturbance in sleep behavior 03/11/2008   SKIN CANCER, HX OF 03/11/2008   DYSURIA, HX OF 03/11/2008   Hyperlipidemia 02/10/2007   CERVICALGIA 02/10/2007    ONSET DATE: 08/29/2023 (referral date)  REFERRING DIAG: G82.54 (ICD-10-CM) - Incomplete quadriplegia at C5-C8 level (HCC)  THERAPY DIAG:  Muscle weakness (generalized)  Other lack of coordination  Other symptoms and signs involving the musculoskeletal system  Other symptoms and signs involving the nervous system  Abnormal posture  Rationale for Evaluation and Treatment: Rehabilitation  SUBJECTIVE:  SUBJECTIVE STATEMENT: Pt received seated in her PWC. Pt did have an appointment with wound care since last visit, had mild debridement performed and only has to be seen every 3 weeks since things are healing well. Pt also went to see Dr. Shearon Stalls since last visit, going to get botox in her hands before she tries her legs (scheduled 4/16).   From initial eval: Pt familiar to this clinic, last seen Oct-Nov 2024 but has been seen for multiple POCs since her initial injury in 2022. Pt returns to this clinic after being in [place] for the past few months. While in [place] in December 2024 patient had a fall where she slid forwards out of her wheelchair onto the floor, ended up fracturing both of her femurs. Pt was hospitalized for 10-11 days and had surgical repair of her femurs. Pt reports she has been cleared of all restrictions since surgery, does have ongoing swelling in both legs and worsened spasticity. Pt reports that the spasticity has slightly improved since it initially started, was told by  Dr. Berline Chough the swelling may not resolve for 6-8 months (it has been 3 months) and not sure if spasticity will resolve but patient is hopeful that if her swelling improves her spasticity will improve as well.  Pt asking about other options to help manage the swelling her legs, has tried variable compression stockings (knee high) and her PTs in [place] recommended tight shapewear. Pt reports that the knee-high compression stockings led to increased swelling in her knees and the shapewear did not help her swelling. Encouraged patient to get thigh-high compression stockings and elevate her LE in PWC. Pt has ordered an articulating bed that will get here next Monday (3/31) so she can better elevate her legs when in bed.  Pt is also concerned about decreased ROM in her legs, [name] works on stretching her legs frequently but she feels she has more motion in her LLE as compared to RLE and may have mild foot drop on her R side. Pt does have PRAFOs to wear at night. Pt has worked up to standing in her standing frame x 30-40 min at a time. Pt also worked on standing with her PT and in // bars in [place]. Pt also reports with her injuries she lost the ability to lock/unlock her R knee but that it is getting better. She reports she lost a lot of stamina during her hospital stay as well.  Pt also has a new wound since last seen in this clinic in her L gluteal fold, shearing injury. Pt was seeing wound care in [place] and is scheduled to see wound care with [facility] early April (was not able to schedule with [facility] wound care until late April).  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 Yes: NPRS scale: 3-4 Pain location: elbows to fingertips on both arms Pain description: nerve pain Aggravating factors: not stated Relieving factors: not stated  PRECAUTIONS: Fall and Other:  osteoporosis  RED FLAGS: None   WEIGHT BEARING RESTRICTIONS: No  FALLS: Has patient fallen in last 6 months? Yes. Number of falls 1 fall in [place] that resulted in B femur fractures  LIVING ENVIRONMENT: Lives with: lives with their spouse and and with full-time caregiver [name] Lives in: House/apartment Home is power wheelchair accessible Has following equipment at home: Wheelchair (power), Wheelchair (manual), Grab bars, [employer]  entry, and standing frame, slide board  PLOF: Independent with household mobility with device, Independent with community mobility with device, Requires assistive device for independence, Needs assistance with ADLs, and Needs assistance with transfers  PATIENT GOALS: "still working on stand and pivot with goal to pivot to commode or to a chair" "work on core-will help me with standing" "improve my stamina - being in the hospital I lost strength/endurance"   OBJECTIVE:  Note: Objective measures were completed at Evaluation unless otherwise noted.  DIAGNOSTIC FINDINGS: None update/relevant to this POC  COGNITION: Overall cognitive status: Within functional limits for tasks assessed   SENSATION: Decreased sensation in BUE and BLE secondary to incomplete quadriplegia Decreased sensation in proximal LLE as compared to distal LE  EDEMA:  Circumferential: R knee: 17"; L knee: 17.5" and Figure 8: R ankle 21", L ankle 21.5"  MUSCLE TONE: increased spasticity in BLE   POSTURE: rounded shoulders and forward head  LOWER EXTREMITY ROM:     Passive  Right Eval Left Eval  Hip flexion Tight hip flexors Tight hip flexors  Hip extension    Hip abduction    Hip adduction    Hip internal rotation    Hip external rotation    Knee flexion Tight HS Tight HS  Knee extension    Ankle dorsiflexion Decreased, tight gastroc Decreased, tight gastroc  Ankle plantarflexion    Ankle inversion    Ankle eversion     (Blank rows = not tested)  LOWER EXTREMITY MMT:     MMT Right Eval Left Eval  Hip flexion 1 2-  Hip extension    Hip abduction    Hip adduction    Hip internal rotation    Hip external rotation    Knee flexion 0 3  Knee extension 2- 2-  Ankle dorsiflexion 2- 3  Ankle plantarflexion    Ankle inversion    Ankle eversion    (Blank rows = not tested)  BED MOBILITY:  From previous POC: Sit to supine Mod A Supine to sit Mod A Rolling to Right Mod A Rolling to Left Mod A Undulating mattress for wound management on standard bed (elevated-so often doing uphill sliding board transfers); she would like to continue working on sitting up independently, she has been working on rolling, needs less assistance w/ this when someone props her leg into hooklying; would like something to help her pull her left leg to her butt for stretching as well as bed mobility.  TRANSFERS: From previous POC: Pt continues using combination of bump over, slide board, and depression (squat pivot) transfers.                                                                                                                              TREATMENT:   NMR To work on functional strengthening of BLE and assessment of sit to stand with decreased external support in // bars: Sit to stand from elevated wheelchair seat  with max A x 3 reps Pt requires most assist at hips for anterior weight shift and upright posture, once in standing she is able to maintain upright posture with significant BUE reliance on // bars and knees blocked for safety Pt stands x 3 reps Initial rep not timed, x 90 sec, x 43 sec Decreased R knee control with onset of fatigue V/c for B quad and glute activation  To work on sitting balance EOM: Unsupported sitting performing BUE strengthening and core activation: 2# weighted dowel chest press 2 x 10 reps 2# weighted dowel bicep curls 2 x 10 reps 2# weighted dowel UUE elbow extension 2 x 10 reps B Progression from one UE support on mat table during  exercise to no UE support Sunshine ball [place] lifts 2 x 10 reps Progression from several LOB posteriorly to just one LOB posteriorly during 2nd set   PATIENT EDUCATION: Education details: continue PROM at home Person educated: Patient and Caregiver [name] Education method: Explanation Education comprehension: verbalized understanding  HOME EXERCISE PROGRAM: Will be established as needed as pt has done continuous therapy and is working towards functional tasks.   GOALS: Goals reviewed with patient? Yes  SHORT TERM GOALS: Target date: 10/07/2023  HEP to be established for stretching and strengthening as appropriate. Baseline: not established at initial eval Goal status: INITIAL  2.  Pt will perform sit to stand transfer with LRAD with mod A Baseline: max A to stedy (4/4) Goal status: INITIAL  3.  Pt to tolerate standing x 5 min with LRAD to demonstrate improved endurance Baseline:  Goal status: INITIAL  4.  Pt to demonstrate reduced edema in BLE with a reduction in circumference/figure 8 measurement by 0.5" from initial evaluation. Baseline: Circumferential: R knee: 17"; L knee: 17.5" and Figure 8: R ankle 21", L ankle 21.5" Goal status: INITIAL   LONG TERM GOALS: Target date: 11/04/2023    Patient and her caregiver to be independent with performance of HEP for stretching and strengthening as appropriate. Baseline: not established at initial eval Goal status: INITIAL  2.  Pt will perform sit to stand transfer with LRAD with min A Baseline: max A to stedy (4/4) Goal status: INITIAL  3.  Pt to tolerate standing x 10 min with LRAD to demonstrate improved endurance Baseline:  Goal status: INITIAL  4.  Pt to demonstrate reduced edema in BLE with a reduction in circumference/figure 8 measurement by 1" from initial evaluation. Baseline: Circumferential: R knee: 17"; L knee: 17.5" and Figure 8: R ankle 21", L ankle 21.5" Goal status: INITIAL    ASSESSMENT:  CLINICAL  IMPRESSION: Emphasis of skilled PT session on assessing sit to stands in // bars and working on sitting balance/core stabilization EOM. Pt does need max A to stand but once in standing is able to maintain standing balance with CGA in // bars with knees blocked for safety. Pt does require increased physical assist in standing with onset of fatigue particularly at her R knee. Pt unable to stand a 4th time so transitioned to work on seated balance. Pt exhibit improved control and sitting balance during 2nd repetitions of exercises with decreased UE support needed for balance and decreased instances of LOB. Patient continues to benefit from skilled PT services to work towards LTGs. Continue POC.    OBJECTIVE IMPAIRMENTS: decreased balance, decreased endurance, decreased mobility, difficulty walking, decreased ROM, decreased strength, increased edema, impaired perceived functional ability, increased muscle spasms, impaired flexibility, impaired sensation, impaired tone, impaired UE functional  use, postural dysfunction, and pain.   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   REHAB POTENTIAL: Good  CLINICAL DECISION MAKING: Stable/uncomplicated  EVALUATION COMPLEXITY: High  PLAN:  PT FREQUENCY: 2x/week  PT DURATION: 8 weeks  PLANNED INTERVENTIONS: 97164- PT Re-evaluation, 97110-Therapeutic exercises, 97530- Therapeutic activity, 97112- Neuromuscular re-education, 97535- Self Care, [id number]- Manual therapy, [phone]- Gait training,  [phone]- Orthotic Fit/training, [phone]- Electrical stimulation (manual), Patient/Family education, Balance training, Stair training, Taping, Dry Needling, Joint mobilization, Scar mobilization, Compression bandaging, DME instructions, Wheelchair mobility training, Cryotherapy, and Moist heat  PLAN FOR NEXT SESSION: any questions over PROM/stretching? sit to stands, standing tolerance with stedy, in // bars, possibly with RW, core strengthening/stability, endurance; wants to have conversation about taking a break (3-6 months) after this POC near end of this POC, standing lateral weight shifts, mini-squats in standing?  Peter Congo, PT Peter Congo, PT, DPT, CSRS  09/20/2023, 9:30 AM

## 2023-09-20 NOTE — Progress Notes (Signed)
 Urine seems infected   where is the culture result ?  it looks like the orders I had placed were not  released or done. Please advise  if there is  urine culture  results  if not I guess need a new specimen .. ( Please see future lab orders)

## 2023-09-21 ENCOUNTER — Encounter: Payer: Self-pay | Admitting: Internal Medicine

## 2023-09-21 ENCOUNTER — Ambulatory Visit: Admitting: Occupational Therapy

## 2023-09-21 ENCOUNTER — Ambulatory Visit: Admitting: Physical Therapy

## 2023-09-21 DIAGNOSIS — R293 Abnormal posture: Secondary | ICD-10-CM

## 2023-09-21 DIAGNOSIS — M25641 Stiffness of right hand, not elsewhere classified: Secondary | ICD-10-CM

## 2023-09-21 DIAGNOSIS — R29818 Other symptoms and signs involving the nervous system: Secondary | ICD-10-CM | POA: Diagnosis not present

## 2023-09-21 DIAGNOSIS — R2689 Other abnormalities of gait and mobility: Secondary | ICD-10-CM | POA: Diagnosis not present

## 2023-09-21 DIAGNOSIS — R29898 Other symptoms and signs involving the musculoskeletal system: Secondary | ICD-10-CM | POA: Diagnosis not present

## 2023-09-21 DIAGNOSIS — G8254 Quadriplegia, C5-C7 incomplete: Secondary | ICD-10-CM

## 2023-09-21 DIAGNOSIS — M6281 Muscle weakness (generalized): Secondary | ICD-10-CM | POA: Diagnosis not present

## 2023-09-21 DIAGNOSIS — M24542 Contracture, left hand: Secondary | ICD-10-CM | POA: Diagnosis not present

## 2023-09-21 DIAGNOSIS — M24541 Contracture, right hand: Secondary | ICD-10-CM | POA: Diagnosis not present

## 2023-09-21 DIAGNOSIS — R278 Other lack of coordination: Secondary | ICD-10-CM | POA: Diagnosis not present

## 2023-09-21 LAB — URINE CULTURE
MICRO NUMBER:: 16296961
SPECIMEN QUALITY:: ADEQUATE

## 2023-09-21 NOTE — Therapy (Signed)
 OUTPATIENT OCCUPATIONAL THERAPY NEURO TREATMENT  Patient Name: [name] MRN: [id number] DOB:January 06, 1951, 73 y.o., female Today's Date: 09/21/2023  PCP: [name], MD  REFERRING PROVIDER: [name], MD  END OF SESSION:  OT End of Session - 09/21/23 0848     Visit Number 4    Number of Visits 17    Date for OT Re-Evaluation 11/04/23    Authorization Type [employer] - requires auth    OT Start Time [phone]    OT Stop Time 0930    OT Time Calculation (min) 43 min    Equipment Utilized During Treatment custom fab thermoplastic splint    Activity Tolerance Patient tolerated treatment well    Behavior During Therapy [facility] for tasks assessed/performed            Past Medical History:  Diagnosis Date   CERVICAL POLYP 03/11/2008   Qualifier: Diagnosis of  By: [name] MD, [name]    Colon polyps 2005   on colonscopy [name]   Fibroid 2004   Per Dr. [name]   History of shingles    face and mouth   Hx of skin cancer, basal cell    Rosacea    Sciatica of left side 09/28/2013   Scoliosis    noted on mri done for back pain   Past Surgical History:  Procedure Laterality Date   BUNIONECTOMY     Patient Active Problem List   Diagnosis Date Noted   Buttock wound, left, subsequent encounter 03/16/2023   Bronchiectasis with acute exacerbation (HCC) 03/15/2023   Buttock wound, left, initial encounter 11/02/2022   Orthostatic hypotension 08/13/2022   Neurogenic bowel 05/03/2022   Spasticity 05/03/2022   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/03/2022   Nerve pain 05/03/2022   Medication monitoring encounter 01/08/2022   Neurogenic bladder 10/11/2021   Urinary incontinence 10/11/2021   ESBL (extended spectrum beta-lactamase) producing bacteria infection 10/09/2021   Recurrent UTI 10/09/2021   Quadriplegia, C5-C7 incomplete (HCC) 01/16/2021   History of spinal fracture 01/16/2021   Suprapubic catheter (HCC) 01/16/2021   Encounter for routine gynecological examination  09/28/2013   Onychomycosis 09/28/2013   Foot deformity, acquired 03/26/2012   Encounter for preventive health examination 12/25/2010   ROSACEA 08/25/2009   Disturbance in sleep behavior 03/11/2008   SKIN CANCER, HX OF 03/11/2008   DYSURIA, HX OF 03/11/2008   Hyperlipidemia 02/10/2007   CERVICALGIA 02/10/2007    ONSET DATE: 07/28/2020  Date of Referral 08/29/2023   REFERRING DIAG: G82.54 (ICD-10-CM) - Quadriplegia, C5-C8, incomplete  THERAPY DIAG:  Stiffness of right hand, not elsewhere classified  Contracture of hand joint, right  Quadriplegia, C5-C7 incomplete (HCC)  Rationale for Evaluation and Treatment: Rehabilitation  SUBJECTIVE:   SUBJECTIVE STATEMENT: Pt had her Botox consultation and will have it next Wed, April 16th.  She reported it was recommended that she have 3 months of therapy following her injections.   Pt brought her splints again today for adjustments.    Pt accompanied by:  Live in Caregiver - [name]   PERTINENT HISTORY: "Pt is a 73 yr old L handed female with hx of incomplete quadriplegia- 2/14 2022- fleeing the police in [place] on passenger 100 (high speed) miles/hour,  [name]; neurogenic bowel and bladder and spasticity; [name], [name] Here for f/u on Incomplete quadriplegia"  B femur fractures December, 2024.  PRECAUTIONS: Fall; suprapubic catheter (she wants to get this removed meaning she needs to get to and from the toilet); she has  had minor heat sensation when needing to complete her bowel program-possible AD?   WEIGHT BEARING RESTRICTIONS: No  PAIN: - reports average pain as noted below. Will notify therapist if there are changes in her pain.  Are you having pain? Yes: NPRS scale: 3/10 Pain location: fingers to elbow bilaterally Pain description: constant Aggravating factors: it can increased over time ie) is worse at the end of the day.  Also cold affects cramps and function. Relieving factors: gabapentin and  baclofen for spasms, nightly stretching  FALLS: Has patient fallen in last 6 months? Yes. Number of falls 1  LIVING ENVIRONMENT: Lives with: lives with their family - husband [name] and with an adult companion s/p moving back up from [place] x10 months Lives in: House/apartment Stairs:  4 story town house with an Engineer, structural with threshold adjustments, roll in shower with transport chair Has following equipment at home: Wheelchair (power) - with seat height adjustments to access counters and reclining option, Wheelchair (manual), transport WC, shower chair, and Ramped entry, handheld showerhead with rails around toilet, had Michiel Sites but is no longer in need of it, has slide boards x3  PLOF: Requires assistive device for independence, Needs assistance with ADLs, Needs assistance with homemaking, Needs assistance with gait, and Needs assistance with transfers; 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  Used to like to knit, sew and bake.  PATIENT GOALS: improve spasticity and use of hands  OBJECTIVE:   HAND DOMINANCE: Left  ADLs: Overall ADLs: Patient has a live in caregiver  Transfers/ambulation related to ADLs: min assist with sliding board transfers.  Eating: Has a rocker knife that she can use. Used to use adapted utensils but now uses regular utensils but still will get assistance to cut food ie) when eating out.  Grooming: can brush her own hair with LUE only; unable to manage jewelry ie) earrings  UB Dressing: can zip/unzip after it has been started, unable to manage buttons herself, Caregiver assists but if she has extra time, she can put on her bra, and a loose fitting pullover shirt/t-shirt  LB Dressing: dependent for LB dressing in bed and with special sock donner for LE compression garments   Toileting: bladder trained with suprapubic catheter which she clamps off.  Dependent for bowel incontinence care.  Bathing: Sponge bath with adult washclothes.  Can bathe UB with  back scrubber for most of her back.  Needs help with feet (mentioned she might need a separate brush for feet)   Tub Shower transfers: Min assist with slide board to wheel in shower chair  Equipment: Shower seat with back, Walk in shower, bed side commode, Reacher, Sock aid, Long handled sponge, and Feeding equipment  IADLs: --  Shopping: Assisted by caregiver  Light housekeeping: Has housekeeper that comes monthly  Meal Prep: previously enjoyed baking. Assisted by caregiver but has reheated a meal for herself after getting food out of the fridge/freezer from her WC.  Community mobility: Dependent  Medication management: Caregiver sorts them into pillbox but she is very aware of her medications   Financial management: Patient manages her own finances  Handwriting: Increased time and has a pen with a little grip  MOBILITY STATUS: Independent with power mobility  ACTIVITY TOLERANCE: Activity tolerance: good to Fair - MMT WFL but has limited sustained tolerance for ongoing use of Ues with poor trunk control  FUNCTIONAL OUTCOME MEASURES:  PSFS: 3.3 total score   Total score = sum of the activity scores/number of activities  Minimum detectable change (90%CI) for average score = 2 points Minimum detectable change (90%CI) for single activity score = 3 points   UPPER EXTREMITY ROM:   AROM - WFL without obvious contractures, some digital flexion noted but PROM WNL   UPPER EXTREMITY MMT:   Grossly WFL - Endurance limited R tricep strength > than L but L UE generally stronger than R UE  MMT Right (eval) Left (eval)  Shoulder flexion 4/5 4/5  Shoulder abduction 4/5 4/5  Elbow flexion 4/5 4/5  Elbow extension 4/5 4/5  (Blank rows = not tested)  HAND FUNCTION: Grip strength: Right: 4.4 lbs (decline) ; Left: 18 lbs (slight improvement)  COORDINATION: To be assessed  SENSATION: Light touch: Impaired  - patient   EDEMA: NA for UEs but LE has poor lymph drainage with custom  compression garments   MUSCLE TONE: Generally WFL   COGNITION: Overall cognitive status: Within functional limits for tasks assessed  VISION: Subjective report: Patent wears progressive lens/glasses.  Denies diplopia or vision changes. Baseline vision: Wears glasses all the time  VISION ASSESSMENT: WFL  OBSERVATIONS: Patient independent with power WC navigation within clinic.  Patient is well-kept with foley catheter in place.  She has slight limitations in full extension of digits but PROM is WNL.    TODAY'S TREATMENT:                                                                                            Orthotic Management:  OTR replaced pt's R resting hand splint straps, velcro, and padding and cleaned inside of splint out. Additional strap added to back of hand to keep it from slipping back off the hand pan during the night.  Straps were trimmed to minimize catching on material and pulling off velcro hook and additional padding added to PIP joint strap to stretch fingers down and then additional moleskin added to webspace of R hand for skin protection.  Pt/caregiver are familiar with splint wear and care and satisfied with changes made for improved appearance, fit and comfort.   Moist heat applied to B hands during splint modifications to promote pain reduction/muscle relaxation with simultaneous passive stretching working to increase ROM for splint application.    PATIENT EDUCATION: Education details: Splint modifications Person educated: Patient and Caregiver Live in caregiver - [name] Education method: Explanation, Demonstration, and Verbal cues Education comprehension: verbalized understanding, returned demonstration, verbal cues required, and needs further education  HOME EXERCISE PROGRAM: None issued at evaluation   GOALS:   SHORT TERM GOALS: Target date: 10/07/2023   1. Patient will verbalize understanding of AE/modified techniques to improve independence and safety  with ADL and IADL completion. Baseline: Caregiver/spouse assist Goal status: IN PROGRESS  2.  Pt will be independent with BUE braces/splints as needed to prevent contracture and improve functional use of hands.  Baseline: Caregiver/spouse assist Goal status: IN Progress  LONG TERM GOALS: Target date: 11/04/2023  Patient will demonstrate independence with updated HEP for UE strengthening, coordination and ROM to prevent contractures and maintain strength for transfers and ADLs. Baseline: Previous HEPs have been established but need to be reviewed and  updated.  Goal status: IN Progress  2.  Patient will report at least two-point increase in average PSFS score or at least three-point increase in a single activity score indicating functionally significant improvement given minimum detectable change. Baseline: 3.3 total score (See above for individual activity scores)  Goal status: IN Progress  3.  Patient will demonstrate at least 10 lbs R grip strength as needed to open jars and other containers. Baseline: 4.4 lbs Goal status: IN PROGRESS   ASSESSMENT:  CLINICAL IMPRESSION: Patient will require further assessment following Botox injections to determine if POC needs to be adjusted. Good understanding of adaptive strategies and adjustments of to both resting hand splints.   PERFORMANCE DEFICITS: in functional skills including ADLs, IADLs, coordination, dexterity, strength, muscle spasms, Fine motor control, Gross motor control, continence, skin integrity, and UE functional use,   IMPAIRMENTS: are limiting patient from ADLs, IADLs, work, and leisure.   CO-MORBIDITIES: has co-morbidities such as incontinence and wound  that affects occupational performance. Patient will benefit from skilled OT to address above impairments and improve overall function.  REHAB POTENTIAL: Fair due to chronicity of injury   PLAN:  OT FREQUENCY: 1-2x/week (pt agreeable to starting off at 2xweek then  tapering with the idea that she will take a break from therapy following this therapy episode.)  OT DURATION: 8 weeks  PLANNED INTERVENTIONS: self care/ADL training, therapeutic exercise, therapeutic activity, neuromuscular re-education, manual therapy, passive range of motion, balance training, functional mobility training, splinting, patient/family education, energy conservation, coping strategies training, and DME and/or AE instructions  RECOMMENDED OTHER SERVICES: Patient was seen for PT evaluation today with treatment plans coordinated for 2x/week.  CONSULTED AND AGREED WITH PLAN OF CARE: Patient and family member/caregiver  PLAN FOR NEXT SESSION:  Check splints Review HEPs Explore FM tasks and establish weight shifting instruction for pressure relief.   Magnetic zipper jacket and other ADL AE  Victorino Sparrow, OT 06/19/1094, 9:52 AM

## 2023-09-21 NOTE — Progress Notes (Signed)
 Multi resistant staph species  not a usually uti germ but if having symptoms   then can take and order macrobid 100 1 po bid for   7 days  disp 14

## 2023-09-21 NOTE — Therapy (Signed)
 OUTPATIENT PHYSICAL THERAPY NEURO TREATMENT Patient Name: [name] MRN: [id number] DOB:1951/02/12, 73 y.o., female Today's Date: 09/21/2023   PCP: Carmen Headings, MD REFERRING PROVIDER: [name], MD  END OF SESSION:  PT End of Session - 09/21/23 0932     Visit Number 4    Number of Visits 17   with eval   Date for PT Re-Evaluation 11/18/23   to allow for scheduling delays   Authorization Type [employer] MEDICARE    PT Start Time 0932   from OT   PT Stop Time 1015    PT Time Calculation (min) 43 min    Equipment Utilized During Treatment Gait belt    Activity Tolerance Patient tolerated treatment well    Behavior During Therapy [facility] for tasks assessed/performed                Past Medical History:  Diagnosis Date   CERVICAL POLYP 03/11/2008   Qualifier: Diagnosis of  By: Carmen Sharp MD, [name]    Colon polyps 2005   on colonscopy Dr. Russella [name]   Fibroid 2004   Per Dr. Dareen [name]   History of shingles    face and mouth   Hx of skin cancer, basal cell    Rosacea    Sciatica of left side 09/28/2013   Scoliosis    noted on mri done for back pain   Past Surgical History:  Procedure Laterality Date   BUNIONECTOMY     Patient Active Problem List   Diagnosis Date Noted   Buttock wound, left, subsequent encounter 03/16/2023   Bronchiectasis with acute exacerbation (HCC) 03/15/2023   Buttock wound, left, initial encounter 11/02/2022   Orthostatic hypotension 08/13/2022   Neurogenic bowel 05/03/2022   Spasticity 05/03/2022   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/03/2022   Nerve pain 05/03/2022   Medication monitoring encounter 01/08/2022   Neurogenic bladder 10/11/2021   Urinary incontinence 10/11/2021   ESBL (extended spectrum beta-lactamase) producing bacteria infection 10/09/2021   Recurrent UTI 10/09/2021   Quadriplegia, C5-C7 incomplete (HCC) 01/16/2021   History of spinal fracture 01/16/2021   Suprapubic catheter (HCC) 01/16/2021   Encounter for routine gynecological  examination 09/28/2013   Onychomycosis 09/28/2013   Foot deformity, acquired 03/26/2012   Encounter for preventive health examination 12/25/2010   ROSACEA 08/25/2009   Disturbance in sleep behavior 03/11/2008   SKIN CANCER, HX OF 03/11/2008   DYSURIA, HX OF 03/11/2008   Hyperlipidemia 02/10/2007   CERVICALGIA 02/10/2007    ONSET DATE: 08/29/2023 (referral date)  REFERRING DIAG: G82.54 (ICD-10-CM) - Incomplete quadriplegia at C5-C8 level (HCC)  THERAPY DIAG:  Muscle weakness (generalized)  Other symptoms and signs involving the musculoskeletal system  Other symptoms and signs involving the nervous system  Abnormal posture  Rationale for Evaluation and Treatment: Rehabilitation  SUBJECTIVE:  SUBJECTIVE STATEMENT: Pt received seated in her PWC from OT session. No acute changes since last visit. Pt reports that she is being worked up for a possible UTI, still waiting on culture to come back. Pt reports that the UTI is causing her LLE to act weird, crampy, shaky? - not spasms.   From initial eval: Pt familiar to this clinic, last seen Oct-Nov 2024 but has been seen for multiple POCs since her initial injury in 2022. Pt returns to this clinic after being in [place] for the past few months. While in [place] in December 2024 patient had a fall where she slid forwards out of her wheelchair onto the floor, ended up fracturing both of her femurs. Pt was hospitalized for 10-11 days and had surgical repair of her femurs. Pt reports she has been cleared of all restrictions since surgery, does have ongoing swelling in both legs and worsened spasticity. Pt reports that the spasticity has slightly improved since it initially started, was told by [name] the swelling may not resolve for 6-8 months (it has been 3  months) and not sure if spasticity will resolve but patient is hopeful that if her swelling improves her spasticity will improve as well.  Pt asking about other options to help manage the swelling her legs, has tried variable compression stockings (knee high) and her PTs in [place] recommended tight shapewear. Pt reports that the knee-high compression stockings led to increased swelling in her knees and the shapewear did not help her swelling. Encouraged patient to get thigh-high compression stockings and elevate her LE in PWC. Pt has ordered an articulating bed that will get here next Monday (3/31) so she can better elevate her legs when in bed.  Pt is also concerned about decreased ROM in her legs, [name] works on stretching her legs frequently but she feels she has more motion in her LLE as compared to RLE and may have mild foot drop on her R side. Pt does have PRAFOs to wear at night. Pt has worked up to standing in her standing frame x 30-40 min at a time. Pt also worked on standing with her PT and in // bars in [place]. Pt also reports with her injuries she lost the ability to lock/unlock her R knee but that it is getting better. She reports she lost a lot of stamina during her hospital stay as well.  Pt also has a new wound since last seen in this clinic in her L gluteal fold, shearing injury. Pt was seeing wound care in [place] and is scheduled to see wound care with [facility] early April (was not able to schedule with [facility] wound care until late April).  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 Yes: NPRS scale: 3-4 Pain location: elbows to fingertips on both arms Pain description: nerve pain Aggravating factors: not stated Relieving factors: not stated  PRECAUTIONS: Fall and Other: osteoporosis  RED FLAGS: None   WEIGHT BEARING RESTRICTIONS: No  FALLS:  Has patient fallen in last 6 months? Yes. Number of falls 1 fall in [place] that resulted in B femur fractures  LIVING ENVIRONMENT: Lives with: lives with their spouse and and with full-time caregiver [name] Lives in: House/apartment Home is power wheelchair accessible Has following equipment at home: Wheelchair (power), Wheelchair (manual), Grab bars, Ramped entry, and standing frame, slide board  PLOF:  Independent with household mobility with device, Independent with community mobility with device, Requires assistive device for independence, Needs assistance with ADLs, and Needs assistance with transfers  PATIENT GOALS: "still working on stand and pivot with goal to pivot to commode or to a chair" "work on core-will help me with standing" "improve my stamina - being in the hospital I lost strength/endurance"   OBJECTIVE:  Note: Objective measures were completed at Evaluation unless otherwise noted.  DIAGNOSTIC FINDINGS: None update/relevant to this POC  COGNITION: Overall cognitive status: Within functional limits for tasks assessed   SENSATION: Decreased sensation in BUE and BLE secondary to incomplete quadriplegia Decreased sensation in proximal LLE as compared to distal LE  EDEMA:  Circumferential: R knee: 17"; L knee: 17.5" and Figure 8: R ankle 21", L ankle 21.5"  MUSCLE TONE: increased spasticity in BLE   POSTURE: rounded shoulders and forward head  LOWER EXTREMITY ROM:     Passive  Right Eval Left Eval  Hip flexion Tight hip flexors Tight hip flexors  Hip extension    Hip abduction    Hip adduction    Hip internal rotation    Hip external rotation    Knee flexion Tight HS Tight HS  Knee extension    Ankle dorsiflexion Decreased, tight gastroc Decreased, tight gastroc  Ankle plantarflexion    Ankle inversion    Ankle eversion     (Blank rows = not tested)  LOWER EXTREMITY MMT:    MMT Right Eval Left Eval  Hip flexion 1 2-  Hip extension    Hip  abduction    Hip adduction    Hip internal rotation    Hip external rotation    Knee flexion 0 3  Knee extension 2- 2-  Ankle dorsiflexion 2- 3  Ankle plantarflexion    Ankle inversion    Ankle eversion    (Blank rows = not tested)  BED MOBILITY:  From previous POC: Sit to supine Mod A Supine to sit Mod A Rolling to Right Mod A Rolling to Left Mod A Undulating mattress for wound management on standard bed (elevated-so often doing uphill sliding board transfers); she would like to continue working on sitting up independently, she has been working on rolling, needs less assistance w/ this when someone props her leg into hooklying; would like something to help her pull her left leg to her butt for stretching as well as bed mobility.  TRANSFERS: From previous POC: Pt continues using combination of bump over, slide board, and depression (squat pivot) transfers.                                                                                                                              TREATMENT:   NMR In standing in // bars: Pt requires max A to stand x 3 reps with BUE support on // bars L/R weight shift x 3 reps of standing Onset of R knee buckling with onset  of fatigue, able to reactivate quad with cues   To work on sitting balance EOM and reaching outside BOS and across midline: 4 Blaze pods on random setting for improved sitting balance and core strengthening.  Performed on 1 minute intervals with 30 rest periods.  Pt requires min A guarding. Setup: 2 placed laterally to patient on mat table and 2 placed on chairs in front of mat table. Transitioned to 2 on chairs being on table in front of patient for last 2 rounds. Round 1:  28 hits. With LUE Round 2:  17 hits. With RUE Round 3:  27 hits. With LUE Round 4:  17 hits. With RUE Round 5:  33 hits. With LUE (up on tray table) Round 6:  21 hits. With RUE (up on tray table) Notable errors/deficits:  decreased balance when reaching  with RUE, decreased ability to maintain upright trunk with increase in trunk flexion noted with onset of fatigue.   PATIENT EDUCATION: Education details: continue PROM at home Person educated: Patient and Caregiver [name] Education method: Explanation Education comprehension: verbalized understanding  HOME EXERCISE PROGRAM: Will be established as needed as pt has done continuous therapy and is working towards functional tasks.   GOALS: Goals reviewed with patient? Yes  SHORT TERM GOALS: Target date: 10/07/2023  HEP to be established for stretching and strengthening as appropriate. Baseline: not established at initial eval Goal status: INITIAL  2.  Pt will perform sit to stand transfer with LRAD with mod A Baseline: max A to stedy (4/4) Goal status: INITIAL  3.  Pt to tolerate standing x 5 min with LRAD to demonstrate improved endurance Baseline:  Goal status: INITIAL  4.  Pt to demonstrate reduced edema in BLE with a reduction in circumference/figure 8 measurement by 0.5" from initial evaluation. Baseline: Circumferential: R knee: 17"; L knee: 17.5" and Figure 8: R ankle 21", L ankle 21.5" Goal status: INITIAL   LONG TERM GOALS: Target date: 11/04/2023    Patient and her caregiver to be independent with performance of HEP for stretching and strengthening as appropriate. Baseline: not established at initial eval Goal status: INITIAL  2.  Pt will perform sit to stand transfer with LRAD with min A Baseline: max A to stedy (4/4) Goal status: INITIAL  3.  Pt to tolerate standing x 10 min with LRAD to demonstrate improved endurance Baseline:  Goal status: INITIAL  4.  Pt to demonstrate reduced edema in BLE with a reduction in circumference/figure 8 measurement by 1" from initial evaluation. Baseline: Circumferential: R knee: 17"; L knee: 17.5" and Figure 8: R ankle 21", L ankle 21.5" Goal status: INITIAL    ASSESSMENT:  CLINICAL IMPRESSION: Emphasis of skilled PT  session on assessing standing again in // bars, working on pre-gait activities in // bars, and working on sitting balance and core strengthening. Pt exhibits improved ability to perform lateral weight-shifting in standing this date as compared to previous session. Pt does fatigue quickly in her B glutes and her R quad in standing. Pt exhibits decreased control of her RUE and impaired sitting balance when performing reaching with her RUE as compared to her LUE. Patient continues to benefit from skilled PT services to work towards LTGs. Continue POC.    OBJECTIVE IMPAIRMENTS: decreased balance, decreased endurance, decreased mobility, difficulty walking, decreased ROM, decreased strength, increased edema, impaired perceived functional ability, increased muscle spasms, impaired flexibility, impaired sensation, impaired tone, impaired UE functional use, postural dysfunction, and pain.   ACTIVITY LIMITATIONS: carrying,  lifting, bending, standing, stairs, transfers, bed mobility, continence, bathing, toileting, dressing, reach over head, and hygiene/grooming  PARTICIPATION LIMITATIONS: meal prep, cleaning, laundry, driving, shopping, community activity, and occupation  PERSONAL FACTORS: Age, Sex, Time since onset of injury/illness/exacerbation, and 1-2 comorbidities:    C7 ASIA C- incomplete quad w/ neurogenic bladder and bowel, HLD, Hx of skin cancerare also affecting patient's functional outcome.   REHAB POTENTIAL: Good  CLINICAL DECISION MAKING: Stable/uncomplicated  EVALUATION COMPLEXITY: High  PLAN:  PT FREQUENCY: 2x/week  PT DURATION: 8 weeks  PLANNED INTERVENTIONS: [id number]- PT Re-evaluation, 97110-Therapeutic exercises, 97530- Therapeutic activity, 97112- Neuromuscular re-education, 97535- Self Care, [id number]- Manual therapy, [phone]- Gait training, [phone]- Orthotic Fit/training, [phone]- Electrical stimulation (manual), Patient/Family education, Balance training, Stair training, Taping, Dry Needling,  Joint mobilization, Scar mobilization, Compression bandaging, DME instructions, Wheelchair mobility training, Cryotherapy, and Moist heat  PLAN FOR NEXT SESSION: any questions over PROM/stretching? sit to stands, standing tolerance with stedy, in // bars, possibly with RW, core strengthening/stability, endurance; wants to have conversation about taking a break (3-6 months) after this POC near end of this POC, standing lateral weight shifts, mini-squats in standing?  Peter Congo, PT Peter Congo, PT, DPT, CSRS  09/21/2023, 10:16 AM

## 2023-09-22 MED ORDER — NITROFURANTOIN MONOHYD MACRO 100 MG PO CAPS: 100.0000 mg | ORAL_CAPSULE | Freq: Two times a day (BID) | ORAL | 0 refills | Status: DC

## 2023-09-27 ENCOUNTER — Ambulatory Visit: Admitting: Physical Therapy

## 2023-09-27 ENCOUNTER — Ambulatory Visit: Admitting: Occupational Therapy

## 2023-09-27 DIAGNOSIS — R293 Abnormal posture: Secondary | ICD-10-CM

## 2023-09-27 DIAGNOSIS — R29818 Other symptoms and signs involving the nervous system: Secondary | ICD-10-CM

## 2023-09-27 DIAGNOSIS — R278 Other lack of coordination: Secondary | ICD-10-CM | POA: Diagnosis not present

## 2023-09-27 DIAGNOSIS — G8254 Quadriplegia, C5-C7 incomplete: Secondary | ICD-10-CM | POA: Diagnosis not present

## 2023-09-27 DIAGNOSIS — M25641 Stiffness of right hand, not elsewhere classified: Secondary | ICD-10-CM

## 2023-09-27 DIAGNOSIS — R29898 Other symptoms and signs involving the musculoskeletal system: Secondary | ICD-10-CM | POA: Diagnosis not present

## 2023-09-27 DIAGNOSIS — M24542 Contracture, left hand: Secondary | ICD-10-CM | POA: Diagnosis not present

## 2023-09-27 DIAGNOSIS — M6281 Muscle weakness (generalized): Secondary | ICD-10-CM

## 2023-09-27 DIAGNOSIS — M24541 Contracture, right hand: Secondary | ICD-10-CM | POA: Diagnosis not present

## 2023-09-27 DIAGNOSIS — R2689 Other abnormalities of gait and mobility: Secondary | ICD-10-CM | POA: Diagnosis not present

## 2023-09-27 NOTE — Therapy (Signed)
 OUTPATIENT PHYSICAL THERAPY NEURO TREATMENT Patient Name: [name] MRN: [id number] DOB:1951-01-27, 73 y.o., female Today's Date: 09/27/2023   PCP: Reginal Capra, MD REFERRING PROVIDER: Celia Coles, MD  END OF SESSION:  PT End of Session - 09/27/23 1106     Visit Number 5    Number of Visits 17   with eval   Date for PT Re-Evaluation 11/18/23   to allow for scheduling delays   Authorization Type [employer] MEDICARE    PT Start Time 1105   from OT session   PT Stop Time 1148    PT Time Calculation (min) 43 min    Equipment Utilized During Treatment Gait belt    Activity Tolerance Patient tolerated treatment well    Behavior During Therapy [facility] for tasks assessed/performed                 Past Medical History:  Diagnosis Date   CERVICAL POLYP 03/11/2008   Qualifier: Diagnosis of  By: Ethel Henry MD, Joaquim Muir    Colon polyps 2005   on colonscopy Dr. Sandrea Cruel   Fibroid 2004   Per Dr. Alva Jewels   History of shingles    face and mouth   Hx of skin cancer, basal cell    Rosacea    Sciatica of left side 09/28/2013   Scoliosis    noted on mri done for back pain   Past Surgical History:  Procedure Laterality Date   BUNIONECTOMY     Patient Active Problem List   Diagnosis Date Noted   Buttock wound, left, subsequent encounter 03/16/2023   Bronchiectasis with acute exacerbation (HCC) 03/15/2023   Buttock wound, left, initial encounter 11/02/2022   Orthostatic hypotension 08/13/2022   Neurogenic bowel 05/03/2022   Spasticity 05/03/2022   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/03/2022   Nerve pain 05/03/2022   Medication monitoring encounter 01/08/2022   Neurogenic bladder 10/11/2021   Urinary incontinence 10/11/2021   ESBL (extended spectrum beta-lactamase) producing bacteria infection 10/09/2021   Recurrent UTI 10/09/2021   Quadriplegia, C5-C7 incomplete (HCC) 01/16/2021   History of spinal fracture 01/16/2021   Suprapubic catheter (HCC) 01/16/2021   Encounter for routine  gynecological examination 09/28/2013   Onychomycosis 09/28/2013   Foot deformity, acquired 03/26/2012   Encounter for preventive health examination 12/25/2010   ROSACEA 08/25/2009   Disturbance in sleep behavior 03/11/2008   SKIN CANCER, HX OF 03/11/2008   DYSURIA, HX OF 03/11/2008   Hyperlipidemia 02/10/2007   CERVICALGIA 02/10/2007    ONSET DATE: 08/29/2023 (referral date)  REFERRING DIAG: G82.54 (ICD-10-CM) - Incomplete quadriplegia at C5-C8 level (HCC)  THERAPY DIAG:  Muscle weakness (generalized)  Abnormal posture  Other symptoms and signs involving the musculoskeletal system  Other symptoms and signs involving the nervous system  Quadriplegia, C5-C7 incomplete (HCC)  Rationale for Evaluation and Treatment: Rehabilitation  SUBJECTIVE:  SUBJECTIVE STATEMENT: Pt received seated in her PWC from OT session. Pt still taking antibiotics for her UTI. With prompting from [name] pt also reports that she caught her R foot in elevator last night, having more swelling today, continuing to monitor this foot - will avoid standing today.   From initial eval: Pt familiar to this clinic, last seen Oct-Nov 2024 but has been seen for multiple POCs since her initial injury in 2022. Pt returns to this clinic after being in [place] for the past few months. While in [place] in December 2024 patient had a fall where she slid forwards out of her wheelchair onto the floor, ended up fracturing both of her femurs. Pt was hospitalized for 10-11 days and had surgical repair of her femurs. Pt reports she has been cleared of all restrictions since surgery, does have ongoing swelling in both legs and worsened spasticity. Pt reports that the spasticity has slightly improved since it initially started, was told by Dr. Berline Chough  the swelling may not resolve for 6-8 months (it has been 3 months) and not sure if spasticity will resolve but patient is hopeful that if her swelling improves her spasticity will improve as well.  Pt asking about other options to help manage the swelling her legs, has tried variable compression stockings (knee high) and her PTs in [place] recommended tight shapewear. Pt reports that the knee-high compression stockings led to increased swelling in her knees and the shapewear did not help her swelling. Encouraged patient to get thigh-high compression stockings and elevate her LE in PWC. Pt has ordered an articulating bed that will get here next Monday (3/31) so she can better elevate her legs when in bed.  Pt is also concerned about decreased ROM in her legs, [name] works on stretching her legs frequently but she feels she has more motion in her LLE as compared to RLE and may have mild foot drop on her R side. Pt does have PRAFOs to wear at night. Pt has worked up to standing in her standing frame x 30-40 min at a time. Pt also worked on standing with her PT and in // bars in [place]. Pt also reports with her injuries she lost the ability to lock/unlock her R knee but that it is getting better. She reports she lost a lot of stamina during her hospital stay as well.  Pt also has a new wound since last seen in this clinic in her L gluteal fold, shearing injury. Pt was seeing wound care in [place] and is scheduled to see wound care with [facility] early April (was not able to schedule with [facility] wound care until late April).  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 Yes: NPRS scale: 3-4 Pain location: elbows to fingertips on both arms Pain description: nerve pain Aggravating factors: not stated Relieving factors: not stated  PRECAUTIONS: Fall and Other: osteoporosis  RED  FLAGS: None   WEIGHT BEARING RESTRICTIONS: No  FALLS: Has patient fallen in last 6 months? Yes. Number of falls 1 fall in [place] that resulted in B femur fractures  LIVING ENVIRONMENT: Lives with: lives with their spouse and and with full-time caregiver [name] Lives in: House/apartment Home is power wheelchair accessible Has following equipment at home: Wheelchair (power), Wheelchair (manual), Grab bars, Ramped entry, and standing frame, slide board  PLOF: Independent with household mobility  with device, Independent with community mobility with device, Requires assistive device for independence, Needs assistance with ADLs, and Needs assistance with transfers  PATIENT GOALS: "still working on stand and pivot with goal to pivot to commode or to a chair" "work on core-will help me with standing" "improve my stamina - being in the hospital I lost strength/endurance"   OBJECTIVE:  Note: Objective measures were completed at Evaluation unless otherwise noted.  DIAGNOSTIC FINDINGS: None update/relevant to this POC  COGNITION: Overall cognitive status: Within functional limits for tasks assessed   SENSATION: Decreased sensation in BUE and BLE secondary to incomplete quadriplegia Decreased sensation in proximal LLE as compared to distal LE  EDEMA:  Circumferential: R knee: 17"; L knee: 17.5" and Figure 8: R ankle 21", L ankle 21.5"  MUSCLE TONE: increased spasticity in BLE   POSTURE: rounded shoulders and forward head  LOWER EXTREMITY ROM:     Passive  Right Eval Left Eval  Hip flexion Tight hip flexors Tight hip flexors  Hip extension    Hip abduction    Hip adduction    Hip internal rotation    Hip external rotation    Knee flexion Tight HS Tight HS  Knee extension    Ankle dorsiflexion Decreased, tight gastroc Decreased, tight gastroc  Ankle plantarflexion    Ankle inversion    Ankle eversion     (Blank rows = not tested)  LOWER EXTREMITY MMT:    MMT Right Eval  Left Eval  Hip flexion 1 2-  Hip extension    Hip abduction    Hip adduction    Hip internal rotation    Hip external rotation    Knee flexion 0 3  Knee extension 2- 2-  Ankle dorsiflexion 2- 3  Ankle plantarflexion    Ankle inversion    Ankle eversion    (Blank rows = not tested)  BED MOBILITY:  From previous POC: Sit to supine Mod A Supine to sit Mod A Rolling to Right Mod A Rolling to Left Mod A Undulating mattress for wound management on standard bed (elevated-so often doing uphill sliding board transfers); she would like to continue working on sitting up independently, she has been working on rolling, needs less assistance w/ this when someone props her leg into hooklying; would like something to help her pull her left leg to her butt for stretching as well as bed mobility.  TRANSFERS: From previous POC: Pt continues using combination of bump over, slide board, and depression (squat pivot) transfers.                                                                                                                              TREATMENT:   TherAct Performed assessment of Roho portion of patient's hybrid cushion at start of session per patient and OT request. Demonstrated how to re-inflate cushion properly. Of note, patient's cushion is too deep for her so when she sits on this  cushion in her PWC the Roho portion is tucked under the back of her chair and only part of her sacrum is sitting on the Roho portion. Patient and [name] report that patient does have a full Roho cushion that is the appropriate size for her and for her chair, will bring next visit. Also discussed pressure relief and use of an undulating cushion for long flight to [place] this summer, plan to assess this next visit as well.  Slide board transfer PWC to/from mat table with min A with patient's caregiver providing assist. Sit to supine mod A for BLE management. Performed PROM of BLE in available planes of motion in  supine prior to working on rolling and prone positioning.  Supine to rolling on L side with mod A needed to place RLE in hooklying and to assist with LUE management. Pt with difficulty reaching up overhead with her LUE once it is under her body but also with difficulty utilizing triceps to lift up her trunk to complete transfer. Once in prone pt able to perform prone hip flexor stretch x 5 min. Prone to R sideling to supine. Supine to rolling to R side with mod A again needed to place LLE in hooklying and for RUE management. Prone push-ups x 5 reps to fatigue. Pt returned to supine with mod A and to sitting EOM with max A for BLE management and trunk elevation.  Pt left seated in her PWC with [name] at end of session.   PATIENT EDUCATION: Education details: continue PROM at home, Roho edu, bring cushions next session Person educated: Patient and Caregiver [name] Education method: Explanation Education comprehension: verbalized understanding  HOME EXERCISE PROGRAM: Will be established as needed as pt has done continuous therapy and is working towards functional tasks.   GOALS: Goals reviewed with patient? Yes  SHORT TERM GOALS: Target date: 10/07/2023  HEP to be established for stretching and strengthening as appropriate. Baseline: not established at initial eval Goal status: INITIAL  2.  Pt will perform sit to stand transfer with LRAD with mod A Baseline: max A to stedy (4/4) Goal status: INITIAL  3.  Pt to tolerate standing x 5 min with LRAD to demonstrate improved endurance Baseline:  Goal status: INITIAL  4.  Pt to demonstrate reduced edema in BLE with a reduction in circumference/figure 8 measurement by 0.5" from initial evaluation. Baseline: Circumferential: R knee: 17"; L knee: 17.5" and Figure 8: R ankle 21", L ankle 21.5" Goal status: INITIAL   LONG TERM GOALS: Target date: 11/04/2023    Patient and her caregiver to be independent with performance of HEP for stretching  and strengthening as appropriate. Baseline: not established at initial eval Goal status: INITIAL  2.  Pt will perform sit to stand transfer with LRAD with min A Baseline: max A to stedy (4/4) Goal status: INITIAL  3.  Pt to tolerate standing x 10 min with LRAD to demonstrate improved endurance Baseline:  Goal status: INITIAL  4.  Pt to demonstrate reduced edema in BLE with a reduction in circumference/figure 8 measurement by 1" from initial evaluation. Baseline: Circumferential: R knee: 17"; L knee: 17.5" and Figure 8: R ankle 21", L ankle 21.5" Goal status: INITIAL    ASSESSMENT:  CLINICAL IMPRESSION: Emphasis of skilled PT session on performing education about Roho cushion management, cushion recommendations for patient, and working on bed mobility with transitions supine to/from prone. Pt continues to require up to mod A for positional changes on mat table. Patient continues  to benefit from skilled PT services to work towards LTGs. Continue POC.    OBJECTIVE IMPAIRMENTS: decreased balance, decreased endurance, decreased mobility, difficulty walking, decreased ROM, decreased strength, increased edema, impaired perceived functional ability, increased muscle spasms, impaired flexibility, impaired sensation, impaired tone, impaired UE functional use, postural dysfunction, and pain.   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   REHAB POTENTIAL: Good  CLINICAL DECISION MAKING: Stable/uncomplicated  EVALUATION  COMPLEXITY: High  PLAN:  PT FREQUENCY: 2x/week  PT DURATION: 8 weeks  PLANNED INTERVENTIONS: 97164- PT Re-evaluation, 97110-Therapeutic exercises, 97530- Therapeutic activity, 97112- Neuromuscular re-education, 97535- Self Care, 16109- Manual therapy, [phone]- Gait training, [phone]- Orthotic Fit/training, [phone]- Electrical stimulation (manual), Patient/Family education, Balance training, Stair training, Taping, Dry Needling, Joint mobilization, Scar mobilization, Compression bandaging, DME instructions, Wheelchair mobility training, Cryotherapy, and Moist heat  PLAN FOR NEXT SESSION: any questions over PROM/stretching? sit to stands, standing tolerance with stedy, in // bars, possibly with RW, core strengthening/stability, endurance; wants to have conversation about taking a break (3-6 months) after this POC near end of this POC, standing lateral weight shifts, mini-squats in standing?, work on bump transfers, assess cushions if patient brings them  Lorita Rosa, PT Lorita Rosa, PT, DPT, CSRS  09/27/2023, 11:48 AM

## 2023-09-27 NOTE — Therapy (Signed)
 OUTPATIENT OCCUPATIONAL THERAPY NEURO TREATMENT  Patient Name: [name] MRN: [id number] DOB:1950-11-07, 73 y.o., female Today's Date: 09/27/2023  PCP: Reginal Capra, MD  REFERRING PROVIDER: Celia Coles, MD  END OF SESSION:  OT End of Session - 09/27/23 1020     Visit Number 5    Number of Visits 17    Date for OT Re-Evaluation 11/04/23    Authorization Type [employer] - requires auth    OT Start Time 1019    OT Stop Time 1100    OT Time Calculation (min) 41 min    Equipment Utilized During Treatment custom fab thermoplastic splint    Activity Tolerance Patient tolerated treatment well    Behavior During Therapy WFL for tasks assessed/performed            Past Medical History:  Diagnosis Date   CERVICAL POLYP 03/11/2008   Qualifier: Diagnosis of  By: Ethel Henry MD, Joaquim Muir    Colon polyps 2005   on colonscopy Dr. Sandrea Cruel   Fibroid 2004   Per Dr. Alva Jewels   History of shingles    face and mouth   Hx of skin cancer, basal cell    Rosacea    Sciatica of left side 09/28/2013   Scoliosis    noted on mri done for back pain   Past Surgical History:  Procedure Laterality Date   BUNIONECTOMY     Patient Active Problem List   Diagnosis Date Noted   Buttock wound, left, subsequent encounter 03/16/2023   Bronchiectasis with acute exacerbation (HCC) 03/15/2023   Buttock wound, left, initial encounter 11/02/2022   Orthostatic hypotension 08/13/2022   Neurogenic bowel 05/03/2022   Spasticity 05/03/2022   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/03/2022   Nerve pain 05/03/2022   Medication monitoring encounter 01/08/2022   Neurogenic bladder 10/11/2021   Urinary incontinence 10/11/2021   ESBL (extended spectrum beta-lactamase) producing bacteria infection 10/09/2021   Recurrent UTI 10/09/2021   Quadriplegia, C5-C7 incomplete (HCC) 01/16/2021   History of spinal fracture 01/16/2021   Suprapubic catheter (HCC) 01/16/2021   Encounter for routine gynecological examination  09/28/2013   Onychomycosis 09/28/2013   Foot deformity, acquired 03/26/2012   Encounter for preventive health examination 12/25/2010   ROSACEA 08/25/2009   Disturbance in sleep behavior 03/11/2008   SKIN CANCER, HX OF 03/11/2008   DYSURIA, HX OF 03/11/2008   Hyperlipidemia 02/10/2007   CERVICALGIA 02/10/2007    ONSET DATE: 07/28/2020  Date of Referral 08/29/2023   REFERRING DIAG: G82.54 (ICD-10-CM) - Quadriplegia, C5-C8, incomplete  THERAPY DIAG:  Muscle weakness (generalized)  Stiffness of right hand, not elsewhere classified  Abnormal posture  Rationale for Evaluation and Treatment: Rehabilitation  SUBJECTIVE:   SUBJECTIVE STATEMENT: Pt her Botox tomorrow - Wed, April 16th.    Pt brought her splints again today for adjustments. Pt also had question re: WC cushion and discomfort noted.   Pt accompanied by:  Live in Caregiver - [name]   PERTINENT HISTORY: "Pt is a 73 yr old L handed female with hx of incomplete quadriplegia- 2/14 2022- fleeing the police in [place] on passenger 100 (high speed) miles/hour,  [name]; neurogenic bowel and bladder and spasticity; [name], [name] Here for f/u on Incomplete quadriplegia"  B femur fractures December, 2024.  PRECAUTIONS: Fall; suprapubic catheter (she wants to get this removed meaning she needs to get to and from the toilet); she has had minor heat sensation when needing to complete her bowel program-possible AD?   WEIGHT  BEARING RESTRICTIONS: No  PAIN: - reports average pain as noted below. Will notify therapist if there are changes in her pain.  Are you having pain? Yes: NPRS scale: 3/10 Pain location: fingers to elbow bilaterally Pain description: constant Aggravating factors: it can increased over time ie) is worse at the end of the day.  Also cold affects cramps and function. Relieving factors: gabapentin and baclofen for spasms, nightly stretching  FALLS: Has patient fallen in last 6 months? Yes.  Number of falls 1  LIVING ENVIRONMENT: Lives with: lives with their family - husband [name] and with an adult companion s/p moving back up from [place] x10 months Lives in: House/apartment Stairs:  4 story town house with an Engineer, structural with threshold adjustments, roll in shower with transport chair Has following equipment at home: Wheelchair (power) - with seat height adjustments to access counters and reclining option, Wheelchair (manual), transport WC, shower chair, and Ramped entry, handheld showerhead with rails around toilet, had Michiel Sites but is no longer in need of it, has slide boards x3  PLOF: Requires assistive device for independence, Needs assistance with ADLs, Needs assistance with homemaking, Needs assistance with gait, and Needs assistance with transfers; 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  Used to like to knit, sew and bake.  PATIENT GOALS: improve spasticity and use of hands  OBJECTIVE:   HAND DOMINANCE: Left  ADLs: Overall ADLs: Patient has a live in caregiver  Transfers/ambulation related to ADLs: min assist with sliding board transfers.  Eating: Has a rocker knife that she can use. Used to use adapted utensils but now uses regular utensils but still will get assistance to cut food ie) when eating out.  Grooming: can brush her own hair with LUE only; unable to manage jewelry ie) earrings  UB Dressing: can zip/unzip after it has been started, unable to manage buttons herself, Caregiver assists but if she has extra time, she can put on her bra, and a loose fitting pullover shirt/t-shirt  LB Dressing: dependent for LB dressing in bed and with special sock donner for LE compression garments   Toileting: bladder trained with suprapubic catheter which she clamps off.  Dependent for bowel incontinence care.  Bathing: Sponge bath with adult washclothes.  Can bathe UB with back scrubber for most of her back.  Needs help with feet (mentioned she might need a  separate brush for feet)   Tub Shower transfers: Min assist with slide board to wheel in shower chair  Equipment: Shower seat with back, Walk in shower, bed side commode, Reacher, Sock aid, Long handled sponge, and Feeding equipment  IADLs: --  Shopping: Assisted by caregiver  Light housekeeping: Has housekeeper that comes monthly  Meal Prep: previously enjoyed baking. Assisted by caregiver but has reheated a meal for herself after getting food out of the fridge/freezer from her WC.  Community mobility: Dependent  Medication management: Caregiver sorts them into pillbox but she is very aware of her medications   Financial management: Patient manages her own finances  Handwriting: Increased time and has a pen with a little grip  MOBILITY STATUS: Independent with power mobility  ACTIVITY TOLERANCE: Activity tolerance: good to Fair - MMT WFL but has limited sustained tolerance for ongoing use of Ues with poor trunk control  FUNCTIONAL OUTCOME MEASURES:  PSFS: 3.3 total score   Total score = sum of the activity scores/number of activities Minimum detectable change (90%CI) for average score = 2 points Minimum detectable change (90%CI) for  single activity score = 3 points   UPPER EXTREMITY ROM:   AROM - WFL without obvious contractures, some digital flexion noted but PROM WNL   UPPER EXTREMITY MMT:   Grossly WFL - Endurance limited R tricep strength > than L but L UE generally stronger than R UE  MMT Right (eval) Left (eval)  Shoulder flexion 4/5 4/5  Shoulder abduction 4/5 4/5  Elbow flexion 4/5 4/5  Elbow extension 4/5 4/5  (Blank rows = not tested)  HAND FUNCTION: Grip strength: Right: 4.4 lbs (decline) ; Left: 18 lbs (slight improvement)  COORDINATION: To be assessed  SENSATION: Light touch: Impaired  - patient   EDEMA: NA for UEs but LE has poor lymph drainage with custom compression garments   MUSCLE TONE: Generally WFL   COGNITION: Overall cognitive  status: Within functional limits for tasks assessed  VISION: Subjective report: Patent wears progressive lens/glasses.  Denies diplopia or vision changes. Baseline vision: Wears glasses all the time  VISION ASSESSMENT: WFL  OBSERVATIONS: Patient independent with power WC navigation within clinic.  Patient is well-kept with foley catheter in place.  She has slight limitations in full extension of digits but PROM is WNL.    TODAY'S TREATMENT:                                                                                            Orthotic Management:  OTR replaced single finger separator on splint by adding full finger separator under all digits via folded soft strap velcroed to finger pan with small dividers x 3 between all digits.   Wheelchair Management:  Pt had some concerns about comfort on her Jay/Combo WC cushion and OT assessed issue with questionable smoothness to air cells under her coccyx and OTR was able to add some additional air to the cushion and P.T. to check cushion further once pt is out of [facility] for physical therapy.  Therapeutic Activities:  Assessed R UE positioning of R index finger for functional tasks due to some ulnar deviation at MCP joint with consideration of options to help with alignment.  OT not able to realign finger well with athletic tape as she needed more pressure on inside of PIP joint and/or solid spacer between index/middle finger.  Pt may benefit from trial of stiff fingerless glove, ulnar drift positioner or Benik finger/thumb splint for positioning as she has improved ROM for grasp and opening of hand with OT putting her finger in between her digits to help with alignment.   PATIENT EDUCATION: Education details: Splint modifications, finger positioning options Person educated: Patient and Caregiver Live in caregiver - [name] Education method: Explanation, Demonstration, and Verbal cues Education comprehension: verbalized understanding, returned  demonstration, verbal cues required, and needs further education  HOME EXERCISE PROGRAM: Previously issued HEP per DC 12/02/22: All previous HEPs combined to 1 complete List through [employer] Access Code: [email or web address]  GOALS:   SHORT TERM GOALS: Target date: 10/07/2023   1. Patient will verbalize understanding of AE/modified techniques to improve independence and safety with ADL and IADL completion. Baseline: Caregiver/spouse assist Goal status: IN PROGRESS  2.  Pt will be  independent with BUE braces/splints as needed to prevent contracture and improve functional use of hands.  Baseline: Caregiver/spouse assist Goal status: IN Progress  LONG TERM GOALS: Target date: 11/04/2023  Patient will demonstrate independence with updated HEP for UE strengthening, coordination and ROM to prevent contractures and maintain strength for transfers and ADLs. Baseline: Previous HEPs have been established but need to be reviewed and updated.  Goal status: IN Progress  2.  Patient will report at least two-point increase in average PSFS score or at least three-point increase in a single activity score indicating functionally significant improvement given minimum detectable change. Baseline: 3.3 total score (See above for individual activity scores)  Goal status: IN Progress  3.  Patient will demonstrate at least 10 lbs R grip strength as needed to open jars and other containers. Baseline: 4.4 lbs Goal status: IN PROGRESS   ASSESSMENT:  CLINICAL IMPRESSION: Patient will require further assessment following Botox injections to determine if POC needs to be adjusted. Good understanding of adaptive strategies and adjustments to both resting hand splints as well as UE activities to help with functional ROM and coordination.   PERFORMANCE DEFICITS: in functional skills including ADLs, IADLs, coordination, dexterity, strength, muscle spasms, Fine motor control, Gross motor control, continence, skin integrity, and  UE functional use,   IMPAIRMENTS: are limiting patient from ADLs, IADLs, work, and leisure.   CO-MORBIDITIES: has co-morbidities such as incontinence and wound  that affects occupational performance. Patient will benefit from skilled OT to address above impairments and improve overall function.  REHAB POTENTIAL: Fair due to chronicity of injury   PLAN:  OT FREQUENCY: 1-2x/week (pt agreeable to starting off at 2xweek then tapering with the idea that she will take a break from therapy following this therapy episode.)  OT DURATION: 8 weeks  PLANNED INTERVENTIONS: self care/ADL training, therapeutic exercise, therapeutic activity, neuromuscular re-education, manual therapy, passive range of motion, balance training, functional mobility training, splinting, patient/family education, energy conservation, coping strategies training, and DME and/or AE instructions  RECOMMENDED OTHER SERVICES: Patient was seen for PT evaluation today with treatment plans coordinated for 2x/week.  CONSULTED AND AGREED WITH PLAN OF CARE: Patient and family member/caregiver  PLAN FOR NEXT SESSION:   Reassess for continued OT s/p Botox injections  Check splints PRN Review HEPs Explore FM tasks and establish weight shifting instruction for pressure relief.   Magnetic zipper jacket and other ADL AE  Zora Hires, OT 7/82/9562, 7:12 PM

## 2023-09-28 ENCOUNTER — Encounter: Admitting: Physical Medicine and Rehabilitation

## 2023-09-28 ENCOUNTER — Encounter: Payer: Self-pay | Admitting: Physical Medicine and Rehabilitation

## 2023-09-28 VITALS — BP 106/61 | HR 68 | Ht 64.0 in

## 2023-09-28 DIAGNOSIS — R252 Cramp and spasm: Secondary | ICD-10-CM | POA: Diagnosis not present

## 2023-09-28 DIAGNOSIS — G8254 Quadriplegia, C5-C7 incomplete: Secondary | ICD-10-CM

## 2023-09-28 MED ORDER — SODIUM CHLORIDE (PF) 0.9 % IJ SOLN
2.0000 mL | Freq: Once | INTRAMUSCULAR | Status: AC
  Administered 2023-09-28: 2 mL

## 2023-09-28 MED ORDER — ONABOTULINUMTOXINA 100 UNITS IJ SOLR
200.0000 [IU] | Freq: Once | INTRAMUSCULAR | Status: AC
  Administered 2023-09-28: 200 [IU] via INTRAMUSCULAR

## 2023-09-28 NOTE — Progress Notes (Signed)
 [name] is a 73 y.o. year old female  who  has a past medical history of CERVICAL POLYP (03/11/2008), Colon polyps (2005), Fibroid (2004), History of shingles, skin cancer, basal cell, Rosacea, Sciatica of left side (09/28/2013), and Scoliosis.     hx of incomplete quadriplegia- 2/14 2022- fleeing the police in [place] on passenger 100 (high speed) miles/hour, [name].  They are presenting to PM&R clinic per Dr. Lovorn for Botox  to her hands.   Exam:  Tone: MAS 2 bilateral PIPS and DIPs, sparing thumb - unchanged   Strength: LUE - 3-4/5 grip, 3/5 FA, 2/5 WE; 5-/5 EF, EE, SA RUE - 1-2 grip, 0/5 FA, 2/5 WE, 5-/5 EF, EE, SA   Botolulinum Toxin Injection: [x ] BOTOX  (onabotulinumtoxinA ) [_] DYSPORT (abobotulinumtoxinA) [_] Xeomin (Incobotulinum toxin A)  Goals with treatment: [x ] Decrease spasms/ abnormal movements [ x ] Improve Active / Passive ROM [x ] Improve ADLs [ x] Improve functional mobility [ ]  Improve gait mechanics [ x] Improve positioning/posture [ x] Prevent contracture  [x ] Prevent joint destruction [ ]  Prevent skin breakdown [ ]  Decrease caregiver burden [ ]  Improve hygiene [x ] Improve Pain  MEDICATION:  ONAbotulinum toxin. 200 Units    NaCl 2 ml     CONSENT: Obtained in writing followed by time-out per policy. Consent uploaded to chart.  Benefits discussed included, but were not limited to, decreased muscle tightness and spasticity, increased joint range of motion, improved limb positioning and facilitation of hygiene and nursing care.   Risks discussed included, but were not limited to, pain and discomfort, bleeding, bruising, excessive weakness, venous thrombosis, muscle atrophy, and distant spread of toxin which could include generalized muscle weakness, diplopia, blurred vision, ptosis, dysphagia, dysphonia, dysarthria, urinary incontinence and breathing difficulties. These symptoms have been reported hours to weeks after injection.  Swallowing and breathing difficulties may be life threatening, and there have been reports of death. Patient/Family member/Guardian/Caregiver have been offered botulinum toxin informational material upon initial consultation and this information has been continually available. All questions answered to patient/family member/guardian/ caregiver satisfaction. They would like to proceed with procedure. There are no noted contraindications to procedure.  PROCEDURE [x]  Without Ultrasound: Patient was placed in a position with the appropriate muscles exposed, located and identified. The skin was cleaned with ChloraPrep and ethyl chloride was sprayed for topical anesthetic. Using combination EMG amplification and electrical stimulation, the following muscles were identified using anatomical landmarks described by Perotto, et al (1994) and injected following aspiration to ensure blood vessels were avoided.  [_ ] With Ultrasound: Patient was placed in a position with the appropriate muscles exposed, located and identified. The skin was cleaned with ChloraPrep and ethyl chloride was sprayed for topical anesthetic. Using combination Ultrasound guidance for anatomical guidance, avoidance of significant vasculature, to decrease the risk of hematoma formation and ensure botox  was placed in the correct location; EMG amplification and electrical stimulation, the following muscles were identified and injected following aspiration to ensure blood vessels were avoided. A _linear transducer was used during the procedure. The botulinum toxin was visualized entering the appropriate musculature.   MUSCLE: Units /Sites 200 U Botox  to bilateral Ues as below:   80  LUE  FDS - 30 FDP -  50 FCU - 20   100 RUE FDS - 30 FDP - 30 FCU - 20 R lumbricals - 20 u (5 ea)   200 units were injected without difficulty. No complications were encountered. The patient tolerated the  procedure well. Wasted 0   PLAN: - Resume Usual  Activities. Notify Physician of any unusual bleeding, erythema or concern for side effects as reviewed above. - Apply ice prn for pain - Tylenol prn for pain - Follow up in 6-8 weeks to assess response to injection

## 2023-09-28 NOTE — Patient Instructions (Signed)
-   Resume Usual Activities. Notify Physician of any unusual bleeding, erythema or concern for side effects as reviewed above. - Apply ice prn for pain - Tylenol prn for pain - Follow up in 6-8 weeks to assess response to injection

## 2023-09-29 ENCOUNTER — Ambulatory Visit: Admitting: Occupational Therapy

## 2023-09-29 ENCOUNTER — Other Ambulatory Visit: Payer: Self-pay | Admitting: Internal Medicine

## 2023-09-29 ENCOUNTER — Ambulatory Visit: Admitting: Physical Therapy

## 2023-09-29 DIAGNOSIS — M6281 Muscle weakness (generalized): Secondary | ICD-10-CM

## 2023-09-29 DIAGNOSIS — R278 Other lack of coordination: Secondary | ICD-10-CM | POA: Diagnosis not present

## 2023-09-29 DIAGNOSIS — R293 Abnormal posture: Secondary | ICD-10-CM | POA: Diagnosis not present

## 2023-09-29 DIAGNOSIS — R29818 Other symptoms and signs involving the nervous system: Secondary | ICD-10-CM | POA: Diagnosis not present

## 2023-09-29 DIAGNOSIS — R2689 Other abnormalities of gait and mobility: Secondary | ICD-10-CM | POA: Diagnosis not present

## 2023-09-29 DIAGNOSIS — G8254 Quadriplegia, C5-C7 incomplete: Secondary | ICD-10-CM | POA: Diagnosis not present

## 2023-09-29 DIAGNOSIS — R29898 Other symptoms and signs involving the musculoskeletal system: Secondary | ICD-10-CM

## 2023-09-29 DIAGNOSIS — M24541 Contracture, right hand: Secondary | ICD-10-CM

## 2023-09-29 DIAGNOSIS — M24542 Contracture, left hand: Secondary | ICD-10-CM

## 2023-09-29 NOTE — Therapy (Signed)
 OUTPATIENT OCCUPATIONAL THERAPY NEURO TREATMENT  Patient Name: [name] MRN: [id number] DOB:January 25, 1951, 73 y.o., female Today's Date: 09/29/2023  PCP: [name], MD  REFERRING PROVIDER: [name], MD  END OF SESSION:  OT End of Session - 09/29/23 1229     Visit Number 6    Number of Visits 17    Date for OT Re-Evaluation 11/04/23    Authorization Type [employer] - requires auth    OT Start Time 1230    OT Stop Time 1315    OT Time Calculation (min) 45 min    Equipment Utilized During Treatment WC cushions    Activity Tolerance Patient tolerated treatment well    Behavior During Therapy WFL for tasks assessed/performed            Past Medical History:  Diagnosis Date   CERVICAL POLYP 03/11/2008   Qualifier: Diagnosis of  By: [name] MD, [name]    Colon polyps 2005   on colonscopy [name]   Fibroid 2004   Per Dr. [name]   History of shingles    face and mouth   Hx of skin cancer, basal cell    Rosacea    Sciatica of left side 09/28/2013   Scoliosis    noted on mri done for back pain   Past Surgical History:  Procedure Laterality Date   BUNIONECTOMY     Patient Active Problem List   Diagnosis Date Noted   Buttock wound, left, subsequent encounter 03/16/2023   Bronchiectasis with acute exacerbation (HCC) 03/15/2023   Buttock wound, left, initial encounter 11/02/2022   Orthostatic hypotension 08/13/2022   Neurogenic bowel 05/03/2022   Spasticity 05/03/2022   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/03/2022   Nerve pain 05/03/2022   Medication monitoring encounter 01/08/2022   Neurogenic bladder 10/11/2021   Urinary incontinence 10/11/2021   ESBL (extended spectrum beta-lactamase) producing bacteria infection 10/09/2021   Recurrent UTI 10/09/2021   Quadriplegia, C5-C7 incomplete (HCC) 01/16/2021   History of spinal fracture 01/16/2021   Suprapubic catheter (HCC) 01/16/2021   Encounter for routine gynecological examination 09/28/2013    Onychomycosis 09/28/2013   Foot deformity, acquired 03/26/2012   Encounter for preventive health examination 12/25/2010   ROSACEA 08/25/2009   Disturbance in sleep behavior 03/11/2008   SKIN CANCER, HX OF 03/11/2008   DYSURIA, HX OF 03/11/2008   Hyperlipidemia 02/10/2007   CERVICALGIA 02/10/2007    ONSET DATE: 07/28/2020  Date of Referral 08/29/2023   REFERRING DIAG: G82.54 (ICD-10-CM) - Quadriplegia, C5-C8, incomplete  THERAPY DIAG:  Muscle weakness (generalized)  Other lack of coordination  Abnormal posture  Other symptoms and signs involving the nervous system  Contracture of hand joint, left  Contracture of hand joint, right  Rationale for Evaluation and Treatment: Rehabilitation  SUBJECTIVE:   SUBJECTIVE STATEMENT: Pt had her Botox yesterday - Wed, April 16th.   80  LUE    100 RUE FDS - 30   FDS - 30 FDP -  50  FDP - 30 FCU - 20   FCU - 20                   R lumbricals - 20 u (5 ea)  She reported that the lumbricals were the most uncomfortable.   Pt brought multiple WC cushions to explore options for max comfort and skin protection.  Pt also reported using her rocker knife at home to cut a thin crust pizza this week with good success. She would not be comfortable using it  in public though.    Pt accompanied by:  Live in Caregiver - [name]   PERTINENT HISTORY: "Pt is a 73 yr old L handed female with hx of incomplete quadriplegia- 2/14 2022- fleeing the police in [place] on passenger 100 (high speed) miles/hour,  [name]; neurogenic bowel and bladder and spasticity; [name], [name] Here for f/u on Incomplete quadriplegia"  B femur fractures December, 2024.  PRECAUTIONS: Fall; suprapubic catheter (she wants to get this removed meaning she needs to get to and from the toilet); she has had minor heat sensation when needing to complete her bowel program-possible AD?   WEIGHT BEARING RESTRICTIONS: No  PAIN: - reports average pain as noted  below. Will notify therapist if there are changes in her pain.  Are you having pain? Yes: NPRS scale: 3/10 Pain location: fingers to elbow bilaterally Pain description: constant Aggravating factors: it can increased over time ie) is worse at the end of the day.  Also cold affects cramps and function. Relieving factors: gabapentin and baclofen for spasms, nightly stretching  FALLS: Has patient fallen in last 6 months? Yes. Number of falls 1  LIVING ENVIRONMENT: Lives with: lives with their family - husband [name] and with an adult companion s/p moving back up from [place] x10 months Lives in: House/apartment Stairs:  4 story town house with an Engineer, structural with threshold adjustments, roll in shower with transport chair Has following equipment at home: Wheelchair (power) - with seat height adjustments to access counters and reclining option, Wheelchair (manual), transport WC, shower chair, and Ramped entry, handheld showerhead with rails around toilet, had Michiel Sites but is no longer in need of it, has slide boards x3  PLOF: Requires assistive device for independence, Needs assistance with ADLs, Needs assistance with homemaking, Needs assistance with gait, and Needs assistance with transfers; 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  Used to like to knit, sew and bake.  PATIENT GOALS: improve spasticity and use of hands  OBJECTIVE:   HAND DOMINANCE: Left  ADLs: Overall ADLs: Patient has a live in caregiver  Transfers/ambulation related to ADLs: min assist with sliding board transfers.  Eating: Has a rocker knife that she can use. Used to use adapted utensils but now uses regular utensils but still will get assistance to cut food ie) when eating out.  Grooming: can brush her own hair with LUE only; unable to manage jewelry ie) earrings  UB Dressing: can zip/unzip after it has been started, unable to manage buttons herself, Caregiver assists but if she has extra time, she can put on  her bra, and a loose fitting pullover shirt/t-shirt  LB Dressing: dependent for LB dressing in bed and with special sock donner for LE compression garments   Toileting: bladder trained with suprapubic catheter which she clamps off.  Dependent for bowel incontinence care.  Bathing: Sponge bath with adult washclothes.  Can bathe UB with back scrubber for most of her back.  Needs help with feet (mentioned she might need a separate brush for feet)   Tub Shower transfers: Min assist with slide board to wheel in shower chair  Equipment: Shower seat with back, Walk in shower, bed side commode, Reacher, Sock aid, Long handled sponge, and Feeding equipment  IADLs: --  Shopping: Assisted by caregiver  Light housekeeping: Has housekeeper that comes monthly  Meal Prep: previously enjoyed baking. Assisted by caregiver but has reheated a meal for herself after getting food out of the  fridge/freezer from her WC.  Community mobility: Dependent  Medication management: Caregiver sorts them into pillbox but she is very aware of her medications   Financial management: Patient manages her own finances  Handwriting: Increased time and has a pen with a little grip  MOBILITY STATUS: Independent with power mobility  ACTIVITY TOLERANCE: Activity tolerance: good to Fair - MMT WFL but has limited sustained tolerance for ongoing use of Ues with poor trunk control  FUNCTIONAL OUTCOME MEASURES:  PSFS: 3.3 total score Hair Care 6 Cutting chicken 4 Opening Body Armor drink 0  Total score = sum of the activity scores/number of activities Minimum detectable change (90%CI) for average score = 2 points Minimum detectable change (90%CI) for single activity score = 3 points   UPPER EXTREMITY ROM:   AROM - WFL without obvious contractures, some digital flexion noted but PROM WNL   UPPER EXTREMITY MMT:   Grossly WFL - Endurance limited R tricep strength > than L but L UE generally stronger than R UE  MMT  Right (eval) Left (eval)  Shoulder flexion 4/5 4/5  Shoulder abduction 4/5 4/5  Elbow flexion 4/5 4/5  Elbow extension 4/5 4/5  (Blank rows = not tested)  HAND FUNCTION: Grip strength: Right: 4.4 lbs (decline) ; Left: 18 lbs (slight improvement)  COORDINATION: 09/29/23 s/p Botox injections yesterday  Left: 56.13 sec Right 3:39.58 min  SENSATION: Light touch: Impaired  - patient   EDEMA: NA for UEs but LE has poor lymph drainage with custom compression garments   MUSCLE TONE: Generally WFL   COGNITION: Overall cognitive status: Within functional limits for tasks assessed  VISION: Subjective report: Patent wears progressive lens/glasses.  Denies diplopia or vision changes. Baseline vision: Wears glasses all the time  VISION ASSESSMENT: WFL  OBSERVATIONS: Patient independent with power WC navigation within clinic.  Patient is well-kept with foley catheter in place.  She has slight limitations in full extension of digits but PROM is WNL.    TODAY'S TREATMENT:                                                                                            Therapeutic activities: Conducted 9 hole peg test with patient. Left: 56.13 sec Right 3:39.58 min Pt had minimal difficulty with LUE but dropped pegs multiple times with R hand.  She had difficulty isolating individual pegs to pick them up and required OT to pick up > 5 pegs and with multiple pegs missing the hole during attempts to insert them.  Wheelchair Management:  Pt had some concerns about comfort on her Jay/Combo WC cushion and OT/PT consulted re: options brought today with P.T. to trial cushions further once pt is out of [facility] for physical therapy.  Therapeutic Exercises:  Pt engaged in PROM and AAROM activities to address ROM limitations in BUEs s/p Botox injections yesterday.  Reviewed muscles injected ie) FDS, FDP, and FCU - muscles in the forearm: Flexor Digitorum Superficialis (FDS) (superficial finger flexor), Flexor  Digitorum Profundus (FDP) (deep finger flexor), and Flexor Carpi Ulnaris (FCU) (wrist flexor). As these muscles are located in the anterior forearm compartment and work  together to flex the wrist and fingers, reviewed and tried different options to stretch muscles ie) praying hand position, weight bearing on WC arm rest and table top as well as lumbrical grasp with MP flexion and DIP/PIP extension over objects ie) locks in  [employer], edge of table etc.   Wrist and digital stretching and positioning used to to increase tissue extensibility and increase muscular relaxation with ultimate goal of improved ROM and decreased posturing for max for contracture management and functional use of UEs.     PATIENT EDUCATION: Education details: UE stretches and MP flexion grasp Person educated: Patient and Caregiver Live in caregiver - [name] Education method: Explanation, Demonstration, and Verbal cues Education comprehension: verbalized understanding, returned demonstration, verbal cues required, and needs further education  HOME EXERCISE PROGRAM: Previously issued HEP per DC 12/02/22: All previous HEPs combined to 1 complete List through [employer] Access Code: [email or web address]  GOALS:   SHORT TERM GOALS: Target date: 10/07/2023   1. Patient will verbalize understanding of AE/modified techniques to improve independence and safety with ADL and IADL completion. Baseline: Caregiver/spouse assist Goal status: IN PROGRESS  2.  Pt will be independent with BUE braces/splints as needed to prevent contracture and improve functional use of hands.  Baseline: Caregiver/spouse assist Goal status: IN Progress  LONG TERM GOALS: Target date: 11/04/2023  Patient will demonstrate independence with updated HEP for UE strengthening, coordination and ROM to prevent contractures and maintain strength for transfers and ADLs. Baseline: Previous HEPs have been established but need to be reviewed and updated.  Goal status: IN  Progress  2.  Patient will report at least two-point increase in average PSFS score or at least three-point increase in a single activity score indicating functionally significant improvement given minimum detectable change. Baseline: 3.3 total score (See above for individual activity scores)  Goal status: IN Progress  3.  Patient will demonstrate at least 10 lbs R grip strength as needed to open jars and other containers. Baseline: 4.4 lbs Goal status: IN PROGRESS   ASSESSMENT:  CLINICAL IMPRESSION: Patient seen following Botox injections today with good tolerance to ROM and functional grasp activities. Pt continues to seek further understanding of adaptive strategies and equipment for BUE activities to help with functional ROM and coordination. Pt will benefit from continued skilled OT services in the outpatient setting to work on impairments as noted at evaluation to help pt return to max functional abilities as able.    PERFORMANCE DEFICITS: in functional skills including ADLs, IADLs, coordination, dexterity, strength, muscle spasms, Fine motor control, Gross motor control, continence, skin integrity, and UE functional use,   IMPAIRMENTS: are limiting patient from ADLs, IADLs, work, and leisure.   CO-MORBIDITIES: has co-morbidities such as incontinence and wound  that affects occupational performance. Patient will benefit from skilled OT to address above impairments and improve overall function.  REHAB POTENTIAL: Fair due to chronicity of injury   PLAN:  OT FREQUENCY: 1-2x/week (pt agreeable to starting off at 2xweek then tapering with the idea that she will take a break from therapy following this therapy episode.)  OT DURATION: 8 weeks  PLANNED INTERVENTIONS: self care/ADL training, therapeutic exercise, therapeutic activity, neuromuscular re-education, manual therapy, passive range of motion, balance training, functional mobility training, splinting, patient/family education,  energy conservation, coping strategies training, and DME and/or AE instructions  RECOMMENDED OTHER SERVICES: Patient was seen for PT evaluation today with treatment plans coordinated for 2x/week.  CONSULTED AND AGREED WITH PLAN OF CARE: Patient and family member/caregiver  PLAN FOR NEXT SESSION:   Reassess for change in goals with OT s/p Botox injections  Check splints PRN Review/progress HEPs Explore FM tasks and establish weight shifting instruction for pressure relief.   Magnetic zipper jacket and other ADL AE  Zora Hires, OT 5/40/9811, 4:11 PM

## 2023-09-29 NOTE — Therapy (Signed)
 OUTPATIENT PHYSICAL THERAPY NEURO TREATMENT Patient Name: [name] MRN: [id number] DOB:12-May-1951, 73 y.o., female Today's Date: 09/29/2023   PCP: Reginal Capra, MD REFERRING PROVIDER: Celia Coles, MD  END OF SESSION:  PT End of Session - 09/29/23 1323     Visit Number 6    Number of Visits 17   with eval   Date for PT Re-Evaluation 11/18/23   to allow for scheduling delays   Authorization Type [employer] MEDICARE    PT Start Time 1315    PT Stop Time 1359    PT Time Calculation (min) 44 min    Equipment Utilized During Treatment Gait belt    Activity Tolerance Patient tolerated treatment well    Behavior During Therapy [facility] for tasks assessed/performed                  Past Medical History:  Diagnosis Date   CERVICAL POLYP 03/11/2008   Qualifier: Diagnosis of  By: Ethel Henry MD, Joaquim Muir    Colon polyps 2005   on colonscopy Dr. Sandrea Cruel   Fibroid 2004   Per Dr. Alva Jewels   History of shingles    face and mouth   Hx of skin cancer, basal cell    Rosacea    Sciatica of left side 09/28/2013   Scoliosis    noted on mri done for back pain   Past Surgical History:  Procedure Laterality Date   BUNIONECTOMY     Patient Active Problem List   Diagnosis Date Noted   Buttock wound, left, subsequent encounter 03/16/2023   Bronchiectasis with acute exacerbation (HCC) 03/15/2023   Buttock wound, left, initial encounter 11/02/2022   Orthostatic hypotension 08/13/2022   Neurogenic bowel 05/03/2022   Spasticity 05/03/2022   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/03/2022   Nerve pain 05/03/2022   Medication monitoring encounter 01/08/2022   Neurogenic bladder 10/11/2021   Urinary incontinence 10/11/2021   ESBL (extended spectrum beta-lactamase) producing bacteria infection 10/09/2021   Recurrent UTI 10/09/2021   Quadriplegia, C5-C7 incomplete (HCC) 01/16/2021   History of spinal fracture 01/16/2021   Suprapubic catheter (HCC) 01/16/2021   Encounter for routine gynecological  examination 09/28/2013   Onychomycosis 09/28/2013   Foot deformity, acquired 03/26/2012   Encounter for preventive health examination 12/25/2010   ROSACEA 08/25/2009   Disturbance in sleep behavior 03/11/2008   SKIN CANCER, HX OF 03/11/2008   DYSURIA, HX OF 03/11/2008   Hyperlipidemia 02/10/2007   CERVICALGIA 02/10/2007    ONSET DATE: 08/29/2023 (referral date)  REFERRING DIAG: G82.54 (ICD-10-CM) - Incomplete quadriplegia at C5-C8 level (HCC)  THERAPY DIAG:  Muscle weakness (generalized)  Abnormal posture  Other symptoms and signs involving the musculoskeletal system  Other symptoms and signs involving the nervous system  Rationale for Evaluation and Treatment: Rehabilitation  SUBJECTIVE:  SUBJECTIVE STATEMENT: Pt received seated in her PWC from OT session. Pt reports that her R foot is doing better today. Pt had Botox injections into her arms and hands with Dr. Shearon Stalls yesterday.  From initial eval: Pt familiar to this clinic, last seen Oct-Nov 2024 but has been seen for multiple POCs since her initial injury in 2022. Pt returns to this clinic after being in [place] for the past few months. While in [place] in December 2024 patient had a fall where she slid forwards out of her wheelchair onto the floor, ended up fracturing both of her femurs. Pt was hospitalized for 10-11 days and had surgical repair of her femurs. Pt reports she has been cleared of all restrictions since surgery, does have ongoing swelling in both legs and worsened spasticity. Pt reports that the spasticity has slightly improved since it initially started, was told by Dr. Berline Chough the swelling may not resolve for 6-8 months (it has been 3 months) and not sure if spasticity will resolve but patient is hopeful that if her swelling  improves her spasticity will improve as well.  Pt asking about other options to help manage the swelling her legs, has tried variable compression stockings (knee high) and her PTs in [place] recommended tight shapewear. Pt reports that the knee-high compression stockings led to increased swelling in her knees and the shapewear did not help her swelling. Encouraged patient to get thigh-high compression stockings and elevate her LE in PWC. Pt has ordered an articulating bed that will get here next Monday (3/31) so she can better elevate her legs when in bed.  Pt is also concerned about decreased ROM in her legs, [name] works on stretching her legs frequently but she feels she has more motion in her LLE as compared to RLE and may have mild foot drop on her R side. Pt does have PRAFOs to wear at night. Pt has worked up to standing in her standing frame x 30-40 min at a time. Pt also worked on standing with her PT and in // bars in [place]. Pt also reports with her injuries she lost the ability to lock/unlock her R knee but that it is getting better. She reports she lost a lot of stamina during her hospital stay as well.  Pt also has a new wound since last seen in this clinic in her L gluteal fold, shearing injury. Pt was seeing wound care in [place] and is scheduled to see wound care with [facility] early April (was not able to schedule with [facility] wound care until late April).  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 Yes: NPRS scale: 3-4 Pain location: elbows to fingertips on both arms Pain description: nerve pain Aggravating factors: not stated Relieving factors: not stated  PRECAUTIONS: Fall and Other: osteoporosis  RED FLAGS: None   WEIGHT BEARING RESTRICTIONS: No  FALLS: Has patient fallen in last 6 months? Yes. Number of falls 1 fall in [place] that resulted in  B femur fractures  LIVING ENVIRONMENT: Lives with: lives with their spouse and and with full-time caregiver [name] Lives in: House/apartment Home is power wheelchair accessible Has following equipment at home: Wheelchair (power), Wheelchair (manual), Grab bars, Ramped entry, and standing frame, slide board  PLOF: Independent with household mobility with device, Independent with community mobility with device, Requires assistive device for independence, Needs assistance with  ADLs, and Needs assistance with transfers  PATIENT GOALS: "still working on stand and pivot with goal to pivot to commode or to a chair" "work on core-will help me with standing" "improve my stamina - being in the hospital I lost strength/endurance"   OBJECTIVE:  Note: Objective measures were completed at Evaluation unless otherwise noted.  DIAGNOSTIC FINDINGS: None update/relevant to this POC  COGNITION: Overall cognitive status: Within functional limits for tasks assessed   SENSATION: Decreased sensation in BUE and BLE secondary to incomplete quadriplegia Decreased sensation in proximal LLE as compared to distal LE  EDEMA:  Circumferential: R knee: 17"; L knee: 17.5" and Figure 8: R ankle 21", L ankle 21.5"  MUSCLE TONE: increased spasticity in BLE   POSTURE: rounded shoulders and forward head  LOWER EXTREMITY ROM:     Passive  Right Eval Left Eval  Hip flexion Tight hip flexors Tight hip flexors  Hip extension    Hip abduction    Hip adduction    Hip internal rotation    Hip external rotation    Knee flexion Tight HS Tight HS  Knee extension    Ankle dorsiflexion Decreased, tight gastroc Decreased, tight gastroc  Ankle plantarflexion    Ankle inversion    Ankle eversion     (Blank rows = not tested)  LOWER EXTREMITY MMT:    MMT Right Eval Left Eval  Hip flexion 1 2-  Hip extension    Hip abduction    Hip adduction    Hip internal rotation    Hip external rotation    Knee flexion 0 3   Knee extension 2- 2-  Ankle dorsiflexion 2- 3  Ankle plantarflexion    Ankle inversion    Ankle eversion    (Blank rows = not tested)  BED MOBILITY:  From previous POC: Sit to supine Mod A Supine to sit Mod A Rolling to Right Mod A Rolling to Left Mod A Undulating mattress for wound management on standard bed (elevated-so often doing uphill sliding board transfers); she would like to continue working on sitting up independently, she has been working on rolling, needs less assistance w/ this when someone props her leg into hooklying; would like something to help her pull her left leg to her butt for stretching as well as bed mobility.  TRANSFERS: From previous POC: Pt continues using combination of bump over, slide board, and depression (squat pivot) transfers.                                                                                                                              TREATMENT:   TherAct Squat pivot/bump transfer PWC to mat table with min to mod A needed to lift hips. Assessed various cushions that patient and her caregiver [name] have brought to PT session today: Manual wheelchair cushion: Marijean Shouts 2 gel cushion Power wheelchair cushion: Marijean Shouts 2 gel cushion Back-up PWC cushion: hybrid with Roho under sacrum and foam  remainder of cushion This cushion is too long for patient so her sacrum does not land on Roho portion Roho cushion: large air cells and short cushion Undulating cushion for plane ride Pt able to transfer from mat table to PWC via slide board onto undulating cushion with min A. Pt able to maintain sitting balance on cushion while cushion is on, cushion provides pressure relief with patient in seated position. Recommend that patient spend a therapy session with PT and with [facility] to review her current cushion collection to see what might be most appropriate cushion for patient to use going forwards and see if a new hybrid cushion that is sized to her would be the  most appropriate. Pt is currently "bottoming out" of her Vonna Kotyk 2 cushion and her caregiver [name] has noticed some red spots and skin breakdown on sacrum. Pt also currently recovering from a wound in this area. This therapist will reach out to [facility] to schedule a seating eval session.  Sit to stand in // bars x 2 reps with max A to stand. Pt tolerates standing about 30 sec each repetition due to fatigue. Pt does exhibit improved R knee control this date with less buckling of her knee noted.  Pt left seated in her PWC with [name] at end of session.   PATIENT EDUCATION: Education details: continue PROM at home, discussed seating eval for further cushion assessment Person educated: Patient and Caregiver [name] Education method: Explanation Education comprehension: verbalized understanding  HOME EXERCISE PROGRAM: Will be established as needed as pt has done continuous therapy and is working towards functional tasks.   GOALS: Goals reviewed with patient? Yes  SHORT TERM GOALS: Target date: 10/07/2023  HEP to be established for stretching and strengthening as appropriate. Baseline: not established at initial eval Goal status: INITIAL  2.  Pt will perform sit to stand transfer with LRAD with mod A Baseline: max A to stedy (4/4) Goal status: INITIAL  3.  Pt to tolerate standing x 5 min with LRAD to demonstrate improved endurance Baseline:  Goal status: INITIAL  4.  Pt to demonstrate reduced edema in BLE with a reduction in circumference/figure 8 measurement by 0.5" from initial evaluation. Baseline: Circumferential: R knee: 17"; L knee: 17.5" and Figure 8: R ankle 21", L ankle 21.5" Goal status: INITIAL   LONG TERM GOALS: Target date: 11/04/2023   Patient and her caregiver to be independent with performance of HEP for stretching and strengthening as appropriate. Baseline: not established at initial eval Goal status: INITIAL  2.  Pt will perform sit to stand transfer with LRAD with min  A Baseline: max A to stedy (4/4) Goal status: INITIAL  3.  Pt to tolerate standing x 10 min with LRAD to demonstrate improved endurance Baseline:  Goal status: INITIAL  4.  Pt to demonstrate reduced edema in BLE with a reduction in circumference/figure 8 measurement by 1" from initial evaluation. Baseline: Circumferential: R knee: 17"; L knee: 17.5" and Figure 8: R ankle 21", L ankle 21.5" Goal status: INITIAL    ASSESSMENT:  CLINICAL IMPRESSION: Emphasis of skilled PT session on assessing patient's various cushions that she uses to determine if one that she currently owns would be most appropriate for skin protection and to allow for wound healing and prevent further skin breakdown. Patient would most likely benefit from a hybrid cushion that is appropriately sized for her but could benefit from a seating eval with PT and [facility] to determine if another cushion may be more  appropriate for her. Patient continues to benefit from skilled PT services to work towards LTGs. Continue POC.    OBJECTIVE IMPAIRMENTS: decreased balance, decreased endurance, decreased mobility, difficulty walking, decreased ROM, decreased strength, increased edema, impaired perceived functional ability, increased muscle spasms, impaired flexibility, impaired sensation, impaired tone, impaired UE functional use, postural dysfunction, and pain.   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   REHAB POTENTIAL: Good  CLINICAL DECISION MAKING:  Stable/uncomplicated  EVALUATION COMPLEXITY: High  PLAN:  PT FREQUENCY: 2x/week  PT DURATION: 8 weeks  PLANNED INTERVENTIONS: 97164- PT Re-evaluation, 97110-Therapeutic exercises, 97530- Therapeutic activity, 97112- Neuromuscular re-education, 97535- Self Care, [id number]- Manual therapy, [phone]- Gait training, [phone]- Orthotic Fit/training, [phone]- Electrical stimulation (manual), Patient/Family education, Balance training, Stair training, Taping, Dry Needling, Joint mobilization, Scar mobilization, Compression bandaging, DME instructions, Wheelchair mobility training, Cryotherapy, and Moist heat  PLAN FOR NEXT SESSION: any questions over PROM/stretching? sit to stands, standing tolerance with stedy, in // bars, possibly with RW, core strengthening/stability, endurance; wants to have conversation about taking a break (3-6 months) after this POC near end of this POC, standing lateral weight shifts, mini-squats in standing?, work on bump transfers, assess cushions if patient brings them - TT to reach out to [facility] to set this up  Lorita Rosa, PT Lorita Rosa, PT, DPT, CSRS  09/29/2023, 2:00 PM

## 2023-10-05 ENCOUNTER — Ambulatory Visit: Admitting: Physical Therapy

## 2023-10-05 ENCOUNTER — Ambulatory Visit: Admitting: Occupational Therapy

## 2023-10-05 ENCOUNTER — Encounter: Payer: Self-pay | Admitting: Physical Therapy

## 2023-10-05 DIAGNOSIS — R2689 Other abnormalities of gait and mobility: Secondary | ICD-10-CM | POA: Diagnosis not present

## 2023-10-05 DIAGNOSIS — R29898 Other symptoms and signs involving the musculoskeletal system: Secondary | ICD-10-CM

## 2023-10-05 DIAGNOSIS — M6281 Muscle weakness (generalized): Secondary | ICD-10-CM

## 2023-10-05 DIAGNOSIS — M24541 Contracture, right hand: Secondary | ICD-10-CM

## 2023-10-05 DIAGNOSIS — R278 Other lack of coordination: Secondary | ICD-10-CM

## 2023-10-05 DIAGNOSIS — M25641 Stiffness of right hand, not elsewhere classified: Secondary | ICD-10-CM

## 2023-10-05 DIAGNOSIS — G8254 Quadriplegia, C5-C7 incomplete: Secondary | ICD-10-CM

## 2023-10-05 DIAGNOSIS — R29818 Other symptoms and signs involving the nervous system: Secondary | ICD-10-CM | POA: Diagnosis not present

## 2023-10-05 DIAGNOSIS — R293 Abnormal posture: Secondary | ICD-10-CM

## 2023-10-05 DIAGNOSIS — M24542 Contracture, left hand: Secondary | ICD-10-CM

## 2023-10-05 NOTE — Therapy (Signed)
 OUTPATIENT PHYSICAL THERAPY NEURO TREATMENT Patient Name: [name] MRN: [id number] DOB:02/07/1951, 73 y.o., female Today's Date: 10/05/2023   PCP: Reginal Capra, MD REFERRING PROVIDER: Celia Coles, MD  END OF SESSION:  PT End of Session - 10/05/23 1107     Visit Number 7    Number of Visits 17   with eval   Date for PT Re-Evaluation 11/18/23   to allow for scheduling delays   Authorization Type [employer] MEDICARE    PT Start Time 1017    PT Stop Time 1107    PT Time Calculation (min) 50 min    Equipment Utilized During Treatment Gait belt    Activity Tolerance Patient tolerated treatment well    Behavior During Therapy [facility] for tasks assessed/performed                  Past Medical History:  Diagnosis Date   CERVICAL POLYP 03/11/2008   Qualifier: Diagnosis of  By: Ethel Henry MD, Joaquim Muir    Colon polyps 2005   on colonscopy Dr. Sandrea Cruel   Fibroid 2004   Per Dr. Alva Jewels   History of shingles    face and mouth   Hx of skin cancer, basal cell    Rosacea    Sciatica of left side 09/28/2013   Scoliosis    noted on mri done for back pain   Past Surgical History:  Procedure Laterality Date   BUNIONECTOMY     Patient Active Problem List   Diagnosis Date Noted   Buttock wound, left, subsequent encounter 03/16/2023   Bronchiectasis with acute exacerbation (HCC) 03/15/2023   Buttock wound, left, initial encounter 11/02/2022   Orthostatic hypotension 08/13/2022   Neurogenic bowel 05/03/2022   Spasticity 05/03/2022   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/03/2022   Nerve pain 05/03/2022   Medication monitoring encounter 01/08/2022   Neurogenic bladder 10/11/2021   Urinary incontinence 10/11/2021   ESBL (extended spectrum beta-lactamase) producing bacteria infection 10/09/2021   Recurrent UTI 10/09/2021   Quadriplegia, C5-C7 incomplete (HCC) 01/16/2021   History of spinal fracture 01/16/2021   Suprapubic catheter (HCC) 01/16/2021   Encounter for routine gynecological  examination 09/28/2013   Onychomycosis 09/28/2013   Foot deformity, acquired 03/26/2012   Encounter for preventive health examination 12/25/2010   ROSACEA 08/25/2009   Disturbance in sleep behavior 03/11/2008   SKIN CANCER, HX OF 03/11/2008   DYSURIA, HX OF 03/11/2008   Hyperlipidemia 02/10/2007   CERVICALGIA 02/10/2007    ONSET DATE: 08/29/2023 (referral date)  REFERRING DIAG: G82.54 (ICD-10-CM) - Incomplete quadriplegia at C5-C8 level (HCC)  THERAPY DIAG:  Muscle weakness (generalized)  Other lack of coordination  Abnormal posture  Other symptoms and signs involving the nervous system  Rationale for Evaluation and Treatment: Rehabilitation  SUBJECTIVE:  SUBJECTIVE STATEMENT: Pt received seated in her PWC in lobby. Pt reports that her R foot is "fine" today, but things like this incident really scared her and [name] (nurse caregiver) due to her severe osteoporosis.   From initial eval: Pt familiar to this clinic, last seen Oct-Nov 2024 but has been seen for multiple POCs since her initial injury in 2022. Pt returns to this clinic after being in [place]  for the past few months. While in [place]  in December 2024 patient had a fall where she slid forwards out of her wheelchair onto the floor, ended up fracturing both of her femurs. Pt was hospitalized for 10-11 days and had surgical repair of her femurs. Pt reports she has been cleared of all restrictions since surgery, does have ongoing swelling in both legs and worsened spasticity. Pt reports that the spasticity has slightly improved since it initially started, was told by Dr. Lovorn the swelling may not resolve for 6-8 months (it has been 3 months) and not sure if spasticity will resolve but patient is hopeful that if her swelling improves her  spasticity will improve as well.  Pt asking about other options to help manage the swelling her legs, has tried variable compression stockings (knee high) and her PTs in [place]  recommended tight shapewear. Pt reports that the knee-high compression stockings led to increased swelling in her knees and the shapewear did not help her swelling. Encouraged patient to get thigh-high compression stockings and elevate her LE in PWC. Pt has ordered an articulating bed that will get here next Monday (3/31) so she can better elevate her legs when in bed.  Pt is also concerned about decreased ROM in her legs, [name] works on stretching her legs frequently but she feels she has more motion in her LLE as compared to RLE and may have mild foot drop on her R side. Pt does have PRAFOs to wear at night. Pt has worked up to standing in her standing frame x 30-40 min at a time. Pt also worked on standing with her PT and in // bars in [place] . Pt also reports with her injuries she lost the ability to lock/unlock her R knee but that it is getting better. She reports she lost a lot of stamina during her hospital stay as well.  Pt also has a new wound since last seen in this clinic in her L gluteal fold, shearing injury. Pt was seeing wound care in [place]  and is scheduled to see wound care with [facility] early April (was not able to schedule with [facility] wound care until late April).  Pt accompanied by: self and caregiver [name]  PERTINENT HISTORY: C7 ASIA C- incomplete quad w/ neurogenic bladder and bowel, HLD, Hx of skin cancer  PAIN:  Are you having pain? Yes: NPRS scale: 3-4 Pain location: elbows to fingertips on both arms Pain description: nerve pain Aggravating factors: not stated Relieving factors: not stated  PRECAUTIONS: Fall and Other: osteoporosis  RED FLAGS: None   WEIGHT BEARING RESTRICTIONS: No  FALLS: Has patient fallen in last 6 months? Yes. Number of falls 1 fall in [place]  that resulted in B femur  fractures  LIVING ENVIRONMENT: Lives with: lives with their spouse and and with full-time caregiver [name] Lives in: House/apartment Home is power wheelchair accessible Has following equipment at home: Wheelchair (power), Wheelchair (manual), Grab bars, Ramped entry, and standing frame, slide board  PLOF: Independent with household mobility with device, Independent with community mobility with device, Requires assistive device for independence,  Needs assistance with ADLs, and Needs assistance with transfers  PATIENT GOALS: "still working on stand and pivot with goal to pivot to commode or to a chair" "work on core-will help me with standing" "improve my stamina - being in the hospital I lost strength/endurance"   OBJECTIVE:  Note: Objective measures were completed at Evaluation unless otherwise noted.  DIAGNOSTIC FINDINGS: None update/relevant to this POC  COGNITION: Overall cognitive status: Within functional limits for tasks assessed   SENSATION: Decreased sensation in BUE and BLE secondary to incomplete quadriplegia Decreased sensation in proximal LLE as compared to distal LE  EDEMA:  Circumferential: R knee: 17"; L knee: 17.5" and Figure 8: R ankle 21", L ankle 21.5"  MUSCLE TONE: increased spasticity in BLE   POSTURE: rounded shoulders and forward head  LOWER EXTREMITY ROM:     Passive  Right Eval Left Eval  Hip flexion Tight hip flexors Tight hip flexors  Hip extension    Hip abduction    Hip adduction    Hip internal rotation    Hip external rotation    Knee flexion Tight HS Tight HS  Knee extension    Ankle dorsiflexion Decreased, tight gastroc Decreased, tight gastroc  Ankle plantarflexion    Ankle inversion    Ankle eversion     (Blank rows = not tested)  LOWER EXTREMITY MMT:    MMT Right Eval Left Eval  Hip flexion 1 2-  Hip extension    Hip abduction    Hip adduction    Hip internal rotation    Hip external rotation    Knee flexion 0 3  Knee  extension 2- 2-  Ankle dorsiflexion 2- 3  Ankle plantarflexion    Ankle inversion    Ankle eversion    (Blank rows = not tested)  BED MOBILITY:  From previous POC: Sit to supine Mod A Supine to sit Mod A Rolling to Right Mod A Rolling to Left Mod A Undulating mattress for wound management on standard bed (elevated-so often doing uphill sliding board transfers); she would like to continue working on sitting up independently, she has been working on rolling, needs less assistance w/ this when someone props her leg into hooklying; would like something to help her pull her left leg to her butt for stretching as well as bed mobility.  TRANSFERS: From previous POC: Pt continues using combination of bump over, slide board, and depression (squat pivot) transfers.                                                                                                                              TREATMENT:   TherAct Squat pivot/bump left transfer PWC to mat table with minA needed for bottom clearance.  Seated on mat w/ 2" block supporting feet: Forward reach to floor, pt able to grab flat item with accessible edge Seated forward leaning BUE pushups alternating UE reliance w/ active assist as needed > trunk circling  to improve mobility and core control Side leaning crossbody reaching/rotation w/ elbow touch as needed reps to fatigue 4-way core engagement using tight side tucks and belly button forward and backwards w/o UE support reps to fatigue 2 attempts to stand from lowered mat height using pull with stedy unsuccessfully > 1 pull-to-stand from elevated mat height modA for posterior hip and trunk support using seats for 2 brief rests then return to upright with some improved posterior for variable <1 minute holds before returning to mat height Squat pivot/bump right transfer PWC to mat table with minA-modA needed for bottom clearance.  More difficulty with consistent clearance with each bump due to  fatigue. Pt left seated in her PWC with [name] at end of session.  PATIENT EDUCATION: Education details: continue PROM at home Person educated: Patient and Caregiver [name] Education method: Explanation Education comprehension: verbalized understanding  HOME EXERCISE PROGRAM: Will be established as needed as pt has done continuous therapy and is working towards functional tasks.   GOALS: Goals reviewed with patient? Yes  SHORT TERM GOALS: Target date: 10/07/2023  HEP to be established for stretching and strengthening as appropriate. Baseline: not established at initial eval Goal status: INITIAL  2.  Pt will perform sit to stand transfer with LRAD with mod A Baseline: max A to stedy (4/4) Goal status: INITIAL  3.  Pt to tolerate standing x 5 min with LRAD to demonstrate improved endurance Baseline:  Goal status: INITIAL  4.  Pt to demonstrate reduced edema in BLE with a reduction in circumference/figure 8 measurement by 0.5" from initial evaluation. Baseline: Circumferential: R knee: 17"; L knee: 17.5" and Figure 8: R ankle 21", L ankle 21.5" Goal status: INITIAL   LONG TERM GOALS: Target date: 11/04/2023   Patient and her caregiver to be independent with performance of HEP for stretching and strengthening as appropriate. Baseline: not established at initial eval Goal status: INITIAL  2.  Pt will perform sit to stand transfer with LRAD with min A Baseline: max A to stedy (4/4) Goal status: INITIAL  3.  Pt to tolerate standing x 10 min with LRAD to demonstrate improved endurance Baseline:  Goal status: INITIAL  4.  Pt to demonstrate reduced edema in BLE with a reduction in circumference/figure 8 measurement by 1" from initial evaluation. Baseline: Circumferential: R knee: 17"; L knee: 17.5" and Figure 8: R ankle 21", L ankle 21.5" Goal status: INITIAL    ASSESSMENT:  CLINICAL IMPRESSION: Emphasis of skilled PT session on core engagement and upright stability in  unsupported sitting.  She appears weaker than prior episode, but still maintains impressive proximal BUE strength.  Left oblique appearing weaker this visit requiring minimal assistance for stability and return to upright from side-bending tasks intermittently.  She continues to benefit from skilled PT in this setting to optimize prior level of function as able.  Will continue per POC.    OBJECTIVE IMPAIRMENTS: decreased balance, decreased endurance, decreased mobility, difficulty walking, decreased ROM, decreased strength, increased edema, impaired perceived functional ability, increased muscle spasms, impaired flexibility, impaired sensation, impaired tone, impaired UE functional use, postural dysfunction, and pain.   ACTIVITY LIMITATIONS: carrying, lifting, bending, standing, stairs, transfers, bed mobility, continence, bathing, toileting, dressing, reach over head, and hygiene/grooming  PARTICIPATION LIMITATIONS: meal prep, cleaning, laundry, driving, shopping, community activity, and occupation  PERSONAL FACTORS: Age, Sex, Time since onset of injury/illness/exacerbation, and 1-2 comorbidities:    C7 ASIA C- incomplete quad w/ neurogenic bladder and bowel, HLD, Hx of skin cancerare  also affecting patient's functional outcome.   REHAB POTENTIAL: Good  CLINICAL DECISION MAKING: Stable/uncomplicated  EVALUATION COMPLEXITY: High  PLAN:  PT FREQUENCY: 2x/week  PT DURATION: 8 weeks  PLANNED INTERVENTIONS: 97164- PT Re-evaluation, 97110-Therapeutic exercises, 97530- Therapeutic activity, 97112- Neuromuscular re-education, 97535- Self Care, 14782- Manual therapy, [phone]- Gait training, [phone]- Orthotic Fit/training, [phone]- Electrical stimulation (manual), Patient/Family education, Balance training, Stair training, Taping, Dry Needling, Joint mobilization, Scar mobilization, Compression bandaging, DME instructions, Wheelchair mobility training, Cryotherapy, and Moist heat  PLAN FOR NEXT SESSION:  any questions over PROM/stretching? sit to stands, standing tolerance with stedy, in // bars, possibly with RW, core strengthening/stability, endurance; wants to have conversation about taking a break (3-6 months) after this POC near end of this POC, standing lateral weight shifts, mini-squats in standing?, work on bump transfers, assess cushions if patient brings them - TT to reach out to [employer] to set this up  Earlean Glaze, PT, DPT  10/05/2023, 11:56 PM

## 2023-10-05 NOTE — Therapy (Signed)
 OUTPATIENT OCCUPATIONAL THERAPY NEURO TREATMENT  Patient Name: [name] MRN: [id number] DOB:04-18-1951, 73 y.o., female Today's Date: 10/05/2023  PCP: Reginal Capra, MD  REFERRING PROVIDER: Celia Coles, MD  END OF SESSION:  OT End of Session - 10/05/23 1110     Visit Number 7    Number of Visits 17    Date for OT Re-Evaluation 11/04/23    Authorization Type [employer] - auth approved    Authorization Time Period 09/09/2023 - 11/04/2023    OT Start Time 1108    OT Stop Time 1146    OT Time Calculation (min) 38 min    Equipment Utilized During Treatment --    Activity Tolerance Patient tolerated treatment well    Behavior During Therapy [facility] for tasks assessed/performed            Past Medical History:  Diagnosis Date   CERVICAL POLYP 03/11/2008   Qualifier: Diagnosis of  By: Ethel Henry MD, Joaquim Muir    Colon polyps 2005   on colonscopy Dr. Sandrea Cruel   Fibroid 2004   Per Dr. Alva Jewels   History of shingles    face and mouth   Hx of skin cancer, basal cell    Rosacea    Sciatica of left side 09/28/2013   Scoliosis    noted on mri done for back pain   Past Surgical History:  Procedure Laterality Date   BUNIONECTOMY     Patient Active Problem List   Diagnosis Date Noted   Buttock wound, left, subsequent encounter 03/16/2023   Bronchiectasis with acute exacerbation (HCC) 03/15/2023   Buttock wound, left, initial encounter 11/02/2022   Orthostatic hypotension 08/13/2022   Neurogenic bowel 05/03/2022   Spasticity 05/03/2022   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/03/2022   Nerve pain 05/03/2022   Medication monitoring encounter 01/08/2022   Neurogenic bladder 10/11/2021   Urinary incontinence 10/11/2021   ESBL (extended spectrum beta-lactamase) producing bacteria infection 10/09/2021   Recurrent UTI 10/09/2021   Quadriplegia, C5-C7 incomplete (HCC) 01/16/2021   History of spinal fracture 01/16/2021   Suprapubic catheter (HCC) 01/16/2021   Encounter for routine  gynecological examination 09/28/2013   Onychomycosis 09/28/2013   Foot deformity, acquired 03/26/2012   Encounter for preventive health examination 12/25/2010   ROSACEA 08/25/2009   Disturbance in sleep behavior 03/11/2008   SKIN CANCER, HX OF 03/11/2008   DYSURIA, HX OF 03/11/2008   Hyperlipidemia 02/10/2007   CERVICALGIA 02/10/2007    ONSET DATE: 07/28/2020  Date of Referral 08/29/2023   REFERRING DIAG: G82.54 (ICD-10-CM) - Quadriplegia, C5-C8, incomplete  THERAPY DIAG:  Muscle weakness (generalized)  Other lack of coordination  Other symptoms and signs involving the nervous system  Contracture of hand joint, left  Contracture of hand joint, right  Other symptoms and signs involving the musculoskeletal system  Quadriplegia, C5-C7 incomplete (HCC)  Stiffness of right hand, not elsewhere classified  Rationale for Evaluation and Treatment: Rehabilitation  SUBJECTIVE:   SUBJECTIVE STATEMENT: Pt reports she feels she has more digit extension on the L hand but has to work harder for thumb adduction. Pt asking on how to complete L wrist and digit extension application with home NMES unit.    Pt accompanied by:  Live in Caregiver - [name]   PERTINENT HISTORY: "Pt is a 73 yr old L handed female with hx of incomplete quadriplegia- 2/14 2022- fleeing the police in [place] on passenger 100 (high speed) miles/hour,  [name]; neurogenic bowel and bladder and spasticity; [name], has low BP  and HLD. Here for f/u on Incomplete quadriplegia"  B femur fractures December, 2024.  PRECAUTIONS: Fall; suprapubic catheter (she wants to get this removed meaning she needs to get to and from the toilet); she has had minor heat sensation when needing to complete her bowel program-possible AD?   WEIGHT BEARING RESTRICTIONS: No  PAIN: - reports average pain as noted below. Will notify therapist if there are changes in her pain.  Are you having pain? Yes: NPRS scale: 3/10 Pain  location: fingers to elbow bilaterally Pain description: constant Aggravating factors: it can increased over time ie) is worse at the end of the day.  Also cold affects cramps and function. Relieving factors: gabapentin  and baclofen  for spasms, nightly stretching  FALLS: Has patient fallen in last 6 months? Yes. Number of falls 1  LIVING ENVIRONMENT: Lives with: lives with their family - husband [name] and with an adult companion s/p moving back up from [place]  x10 months Lives in: House/apartment Stairs:  4 story town house with an Engineer, structural with threshold adjustments, roll in shower with transport chair Has following equipment at home: Wheelchair (power) - with seat height adjustments to access counters and reclining option, Wheelchair (manual), transport WC, shower chair, and Ramped entry, handheld showerhead with rails around toilet, had Alen Amy but is no longer in need of it, has slide boards x3  PLOF: Requires assistive device for independence, Needs assistance with ADLs, Needs assistance with homemaking, Needs assistance with gait, and Needs assistance with transfers; 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  Used to like to knit, sew and bake.  PATIENT GOALS: improve spasticity and use of hands  OBJECTIVE:   HAND DOMINANCE: Left  ADLs: Overall ADLs: Patient has a live in caregiver  Transfers/ambulation related to ADLs: min assist with sliding board transfers.  Eating: Has a rocker knife that she can use. Used to use adapted utensils but now uses regular utensils but still will get assistance to cut food ie) when eating out.  Grooming: can brush her own hair with LUE only; unable to manage jewelry ie) earrings  UB Dressing: can zip/unzip after it has been started, unable to manage buttons herself, Caregiver assists but if she has extra time, she can put on her bra, and a loose fitting pullover shirt/t-shirt  LB Dressing: dependent for LB dressing in bed and with  special sock donner for LE compression garments   Toileting: bladder trained with suprapubic catheter which she clamps off.  Dependent for bowel incontinence care.  Bathing: Sponge bath with adult washclothes.  Can bathe UB with back scrubber for most of her back.  Needs help with feet (mentioned she might need a separate brush for feet)   Tub Shower transfers: Min assist with slide board to wheel in shower chair  Equipment: Shower seat with back, Walk in shower, bed side commode, Reacher, Sock aid, Long handled sponge, and Feeding equipment  IADLs: --  Shopping: Assisted by caregiver  Light housekeeping: Has housekeeper that comes monthly  Meal Prep: previously enjoyed baking. Assisted by caregiver but has reheated a meal for herself after getting food out of the fridge/freezer from her WC.  Community mobility: Dependent  Medication management: Caregiver sorts them into pillbox but she is very aware of her medications   Financial management: Patient manages her own finances  Handwriting: Increased time and has a pen with a little grip  MOBILITY STATUS: Independent with power mobility  ACTIVITY TOLERANCE: Activity tolerance: good to Fair -  MMT WFL but has limited sustained tolerance for ongoing use of Ues with poor trunk control  FUNCTIONAL OUTCOME MEASURES:  PSFS: 3.3 total score Hair Care 6 Cutting chicken 4 Opening Body Armor drink 0  Total score = sum of the activity scores/number of activities Minimum detectable change (90%CI) for average score = 2 points Minimum detectable change (90%CI) for single activity score = 3 points   UPPER EXTREMITY ROM:   AROM - WFL without obvious contractures, some digital flexion noted but PROM WNL   UPPER EXTREMITY MMT:   Grossly WFL - Endurance limited R tricep strength > than L but L UE generally stronger than R UE  MMT Right (eval) Left (eval)  Shoulder flexion 4/5 4/5  Shoulder abduction 4/5 4/5  Elbow flexion 4/5 4/5  Elbow  extension 4/5 4/5  (Blank rows = not tested)  HAND FUNCTION: Grip strength: Right: 4.4 lbs (decline) ; Left: 18 lbs (slight improvement)  COORDINATION: 09/29/23 s/p Botox  injections yesterday  Left: 56.13 sec Right 3:39.58 min  SENSATION: Light touch: Impaired  - patient   EDEMA: NA for UEs but LE has poor lymph drainage with custom compression garments   MUSCLE TONE: Generally WFL   COGNITION: Overall cognitive status: Within functional limits for tasks assessed  VISION: Subjective report: Patent wears progressive lens/glasses.  Denies diplopia or vision changes. Baseline vision: Wears glasses all the time  VISION ASSESSMENT: WFL  OBSERVATIONS: Patient independent with power WC navigation within clinic.  Patient is well-kept with foley catheter in place.  She has slight limitations in full extension of digits but PROM is WNL.    TODAY'S TREATMENT:                                                                                            - Neuro re-education completed for duration as noted below including: OT educated on application for L wrist and digit extension. Electrodes were applied and NMES was utilized for improved ROM and coordination. Patient verbalized understanding.    - Self-care/home management completed for duration as noted below including: OT educated on how to set up NMES for smooth muscle contractions to promote improved BUE AROM. Additional education provided on electrode use/maintenance.  OT educated pt on effects of Botox  following injection for spasticity management. Pt advised changes noted may still change due to acuteness of injections.   PATIENT EDUCATION: Education details: NMES use Person educated: Patient and Software engineer in caregiver - [name] Education method: Explanation, Demonstration, and Verbal cues Education comprehension: verbalized understanding, returned demonstration, verbal cues required, and needs further education  HOME EXERCISE  PROGRAM: Previously issued HEP per DC 12/02/22: All previous HEPs combined to 1 complete List through [employer] Access Code: [email or web address]  GOALS:   SHORT TERM GOALS: Target date: 10/07/2023   1. Patient will verbalize understanding of AE/modified techniques to improve independence and safety with ADL and IADL completion. Baseline: Caregiver/spouse assist Goal status: IN PROGRESS  2.  Pt will be independent with BUE braces/splints as needed to prevent contracture and improve functional use of hands.  Baseline: Caregiver/spouse assist Goal status: IN Progress  LONG TERM GOALS:  Target date: 11/04/2023  Patient will demonstrate independence with updated HEP for UE strengthening, coordination and ROM to prevent contractures and maintain strength for transfers and ADLs. Baseline: Previous HEPs have been established but need to be reviewed and updated.  Goal status: IN Progress  2.  Patient will report at least two-point increase in average PSFS score or at least three-point increase in a single activity score indicating functionally significant improvement given minimum detectable change. Baseline: 3.3 total score (See above for individual activity scores)  Goal status: IN Progress  3.  Patient will demonstrate at least 10 lbs R grip strength as needed to open jars and other containers. Baseline: 4.4 lbs Goal status: IN PROGRESS   ASSESSMENT:  CLINICAL IMPRESSION: Patient verbalizes good understanding of NMES use and good reaction/tolerance with use this visit. Will need to see if pt's unit allows for reciprocal application or only symmetrical and make recommendations to incorporate into a functional task.   PERFORMANCE DEFICITS: in functional skills including ADLs, IADLs, coordination, dexterity, strength, muscle spasms, Fine motor control, Gross motor control, continence, skin integrity, and UE functional use,   IMPAIRMENTS: are limiting patient from ADLs, IADLs, work, and leisure.    CO-MORBIDITIES: has co-morbidities such as incontinence and wound  that affects occupational performance. Patient will benefit from skilled OT to address above impairments and improve overall function.  REHAB POTENTIAL: Fair due to chronicity of injury  PLAN:  OT FREQUENCY: 1-2x/week (pt agreeable to starting off at 2xweek then tapering with the idea that she will take a break from therapy following this therapy episode.)  OT DURATION: 8 weeks  PLANNED INTERVENTIONS: self care/ADL training, therapeutic exercise, therapeutic activity, neuromuscular re-education, manual therapy, passive range of motion, balance training, functional mobility training, splinting, patient/family education, energy conservation, coping strategies training, and DME and/or AE instructions  RECOMMENDED OTHER SERVICES: Patient was seen for PT evaluation today with treatment plans coordinated for 2x/week.  CONSULTED AND AGREED WITH PLAN OF CARE: Patient and family member/caregiver  PLAN FOR NEXT SESSION:  NMES instruction (does unit have reciprocal option)  Reassess for change in goals with OT s/p Botox  injections  Check splints PRN Review/progress HEPs Explore FM tasks and establish weight shifting instruction for pressure relief.   Magnetic zipper jacket and other ADL AE  Altamease Asters, OT 10/05/2023, 2:25 PM

## 2023-10-06 ENCOUNTER — Encounter (HOSPITAL_BASED_OUTPATIENT_CLINIC_OR_DEPARTMENT_OTHER): Attending: Internal Medicine | Admitting: Internal Medicine

## 2023-10-06 DIAGNOSIS — S31829A Unspecified open wound of left buttock, initial encounter: Secondary | ICD-10-CM | POA: Diagnosis not present

## 2023-10-06 DIAGNOSIS — S30810A Abrasion of lower back and pelvis, initial encounter: Secondary | ICD-10-CM | POA: Insufficient documentation

## 2023-10-06 DIAGNOSIS — T798XXA Other early complications of trauma, initial encounter: Secondary | ICD-10-CM | POA: Diagnosis not present

## 2023-10-06 DIAGNOSIS — X58XXXA Exposure to other specified factors, initial encounter: Secondary | ICD-10-CM | POA: Insufficient documentation

## 2023-10-07 ENCOUNTER — Ambulatory Visit: Admitting: Physical Therapy

## 2023-10-07 ENCOUNTER — Ambulatory Visit: Admitting: Occupational Therapy

## 2023-10-07 ENCOUNTER — Ambulatory Visit (HOSPITAL_BASED_OUTPATIENT_CLINIC_OR_DEPARTMENT_OTHER): Admitting: General Surgery

## 2023-10-07 DIAGNOSIS — R29898 Other symptoms and signs involving the musculoskeletal system: Secondary | ICD-10-CM

## 2023-10-07 DIAGNOSIS — G8253 Quadriplegia, C5-C7 complete: Secondary | ICD-10-CM

## 2023-10-07 DIAGNOSIS — M6281 Muscle weakness (generalized): Secondary | ICD-10-CM | POA: Diagnosis not present

## 2023-10-07 DIAGNOSIS — G8254 Quadriplegia, C5-C7 incomplete: Secondary | ICD-10-CM

## 2023-10-07 DIAGNOSIS — R293 Abnormal posture: Secondary | ICD-10-CM | POA: Diagnosis not present

## 2023-10-07 DIAGNOSIS — M24541 Contracture, right hand: Secondary | ICD-10-CM | POA: Diagnosis not present

## 2023-10-07 DIAGNOSIS — R278 Other lack of coordination: Secondary | ICD-10-CM

## 2023-10-07 DIAGNOSIS — R2689 Other abnormalities of gait and mobility: Secondary | ICD-10-CM | POA: Diagnosis not present

## 2023-10-07 DIAGNOSIS — M25641 Stiffness of right hand, not elsewhere classified: Secondary | ICD-10-CM

## 2023-10-07 DIAGNOSIS — M24542 Contracture, left hand: Secondary | ICD-10-CM | POA: Diagnosis not present

## 2023-10-07 DIAGNOSIS — R29818 Other symptoms and signs involving the nervous system: Secondary | ICD-10-CM

## 2023-10-07 NOTE — Therapy (Signed)
 OUTPATIENT OCCUPATIONAL THERAPY NEURO TREATMENT  Patient Name: [name] MRN: [id number] DOB:18-Apr-1951, 73 y.o., female Today's Date: 10/07/2023  PCP: Reginal Capra, MD  REFERRING PROVIDER: Celia Coles, MD  END OF SESSION:  OT End of Session - 10/07/23 1421     Visit Number 8    Number of Visits 17    Date for OT Re-Evaluation 11/04/23    Authorization Type [employer] - auth approved    Authorization Time Period 09/09/2023 - 11/04/2023    OT Start Time 1104    OT Stop Time 1145    OT Time Calculation (min) 41 min    Activity Tolerance Patient tolerated treatment well    Behavior During Therapy [facility] for tasks assessed/performed            Past Medical History:  Diagnosis Date   CERVICAL POLYP 03/11/2008   Qualifier: Diagnosis of  By: Ethel Henry MD, Joaquim Muir    Colon polyps 2005   on colonscopy Dr. Sandrea Cruel   Fibroid 2004   Per Dr. Alva Jewels   History of shingles    face and mouth   Hx of skin cancer, basal cell    Rosacea    Sciatica of left side 09/28/2013   Scoliosis    noted on mri done for back pain   Past Surgical History:  Procedure Laterality Date   BUNIONECTOMY     Patient Active Problem List   Diagnosis Date Noted   Buttock wound, left, subsequent encounter 03/16/2023   Bronchiectasis with acute exacerbation (HCC) 03/15/2023   Buttock wound, left, initial encounter 11/02/2022   Orthostatic hypotension 08/13/2022   Neurogenic bowel 05/03/2022   Spasticity 05/03/2022   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/03/2022   Nerve pain 05/03/2022   Medication monitoring encounter 01/08/2022   Neurogenic bladder 10/11/2021   Urinary incontinence 10/11/2021   ESBL (extended spectrum beta-lactamase) producing bacteria infection 10/09/2021   Recurrent UTI 10/09/2021   Quadriplegia, C5-C7 incomplete (HCC) 01/16/2021   History of spinal fracture 01/16/2021   Suprapubic catheter (HCC) 01/16/2021   Encounter for routine gynecological examination 09/28/2013    Onychomycosis 09/28/2013   Foot deformity, acquired 03/26/2012   Encounter for preventive health examination 12/25/2010   ROSACEA 08/25/2009   Disturbance in sleep behavior 03/11/2008   SKIN CANCER, HX OF 03/11/2008   DYSURIA, HX OF 03/11/2008   Hyperlipidemia 02/10/2007   CERVICALGIA 02/10/2007    ONSET DATE: 07/28/2020  Date of Referral 08/29/2023   REFERRING DIAG: G82.54 (ICD-10-CM) - Quadriplegia, C5-C8, incomplete  THERAPY DIAG:  Muscle weakness (generalized)  Other lack of coordination  Other symptoms and signs involving the nervous system  Contracture of hand joint, left  Contracture of hand joint, right  Other symptoms and signs involving the musculoskeletal system  Quadriplegia, C5-C7 incomplete (HCC)  Stiffness of right hand, not elsewhere classified  Rationale for Evaluation and Treatment: Rehabilitation  SUBJECTIVE:   SUBJECTIVE STATEMENT: Pt cannot recall electrode placement for adaptive ArcEx use.    Pt accompanied by:  Live in Caregiver - [name]   PERTINENT HISTORY: "Pt is a 73 yr old L handed female with hx of incomplete quadriplegia- 2/14 2022- fleeing the police in [place] on passenger 100 (high speed) miles/hour,  [name]; neurogenic bowel and bladder and spasticity; [name], [name] Here for f/u on Incomplete quadriplegia"  B femur fractures December, 2024.  PRECAUTIONS: Fall; suprapubic catheter (she wants to get this removed meaning she needs to get to and from the toilet); she  has had minor heat sensation when needing to complete her bowel program-possible AD?   WEIGHT BEARING RESTRICTIONS: No  PAIN: - reports average pain as noted below. Will notify therapist if there are changes in her pain.  Are you having pain? Yes: NPRS scale: 3/10 Pain location: fingers to elbow bilaterally Pain description: constant Aggravating factors: it can increased over time ie) is worse at the end of the day.  Also cold affects cramps and  function. Relieving factors: gabapentin  and baclofen  for spasms, nightly stretching  FALLS: Has patient fallen in last 6 months? Yes. Number of falls 1  LIVING ENVIRONMENT: Lives with: lives with their family - husband [name] and with an adult companion s/p moving back up from [place]  x10 months Lives in: House/apartment Stairs:  4 story town house with an Engineer, structural with threshold adjustments, roll in shower with transport chair Has following equipment at home: Wheelchair (power) - with seat height adjustments to access counters and reclining option, Wheelchair (manual), transport WC, shower chair, and Ramped entry, handheld showerhead with rails around toilet, had Alen Amy but is no longer in need of it, has slide boards x3  PLOF: Requires assistive device for independence, Needs assistance with ADLs, Needs assistance with homemaking, Needs assistance with gait, and Needs assistance with transfers; 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  Used to like to knit, sew and bake.  PATIENT GOALS: improve spasticity and use of hands  OBJECTIVE:   HAND DOMINANCE: Left  ADLs: Overall ADLs: Patient has a live in caregiver  Transfers/ambulation related to ADLs: min assist with sliding board transfers.  Eating: Has a rocker knife that she can use. Used to use adapted utensils but now uses regular utensils but still will get assistance to cut food ie) when eating out.  Grooming: can brush her own hair with LUE only; unable to manage jewelry ie) earrings  UB Dressing: can zip/unzip after it has been started, unable to manage buttons herself, Caregiver assists but if she has extra time, she can put on her bra, and a loose fitting pullover shirt/t-shirt  LB Dressing: dependent for LB dressing in bed and with special sock donner for LE compression garments   Toileting: bladder trained with suprapubic catheter which she clamps off.  Dependent for bowel incontinence care.  Bathing: Sponge  bath with adult washclothes.  Can bathe UB with back scrubber for most of her back.  Needs help with feet (mentioned she might need a separate brush for feet)   Tub Shower transfers: Min assist with slide board to wheel in shower chair  Equipment: Shower seat with back, Walk in shower, bed side commode, Reacher, Sock aid, Long handled sponge, and Feeding equipment  IADLs: --  Shopping: Assisted by caregiver  Light housekeeping: Has housekeeper that comes monthly  Meal Prep: previously enjoyed baking. Assisted by caregiver but has reheated a meal for herself after getting food out of the fridge/freezer from her WC.  Community mobility: Dependent  Medication management: Caregiver sorts them into pillbox but she is very aware of her medications   Financial management: Patient manages her own finances  Handwriting: Increased time and has a pen with a little grip  MOBILITY STATUS: Independent with power mobility  ACTIVITY TOLERANCE: Activity tolerance: good to Fair - MMT WFL but has limited sustained tolerance for ongoing use of Ues with poor trunk control  FUNCTIONAL OUTCOME MEASURES:  PSFS: 3.3 total score Hair Care 6 Cutting chicken 4 Opening Body Armor drink 0  Total score = sum of the activity scores/number of activities Minimum detectable change (90%CI) for average score = 2 points Minimum detectable change (90%CI) for single activity score = 3 points   UPPER EXTREMITY ROM:   AROM - WFL without obvious contractures, some digital flexion noted but PROM WNL   UPPER EXTREMITY MMT:   Grossly WFL - Endurance limited R tricep strength > than L but L UE generally stronger than R UE  MMT Right (eval) Left (eval)  Shoulder flexion 4/5 4/5  Shoulder abduction 4/5 4/5  Elbow flexion 4/5 4/5  Elbow extension 4/5 4/5  (Blank rows = not tested)  HAND FUNCTION: Grip strength: Right: 4.4 lbs (decline) ; Left: 18 lbs (slight improvement)  COORDINATION: 09/29/23 s/p Botox   injections yesterday  Left: 56.13 sec Right 3:39.58 min  SENSATION: Light touch: Impaired  - patient   EDEMA: NA for UEs but LE has poor lymph drainage with custom compression garments   MUSCLE TONE: Generally WFL   COGNITION: Overall cognitive status: Within functional limits for tasks assessed  VISION: Subjective report: Patent wears progressive lens/glasses.  Denies diplopia or vision changes. Baseline vision: Wears glasses all the time  VISION ASSESSMENT: WFL  OBSERVATIONS: Patient independent with power WC navigation within clinic.  Patient is well-kept with foley catheter in place.  She has slight limitations in full extension of digits but PROM is WNL.    TODAY'S TREATMENT:                                                                                            - Neuro re-education completed for duration as noted below including: OT educated on application for R wrist and digit extension. Electrodes were applied and NMES was utilized for improved ROM and coordination. Patient verbalized understanding. OT trialed same application to LUE while using channel 2 to determine if pt's device could do reciprocal stimulation. It was determined that original NMES unit could only do simultaneous, but her secondary NMES unit can do alternating stimulation.   - Self-care/home management completed for duration as noted below including: OT educated on use of automatic jar opener. Pt trialed use demonstrating ability to depress button.   Discussed ArcEx FDA status. Pt to reach out to therapist at [facility] that was a part of medical trials and who recommended alternative NMES application.   PATIENT EDUCATION: Education details: NMES use; adaptive devices Person educated: Patient and Caregiver Live in caregiver - [name] Education method: Explanation, Demonstration, and Verbal cues Education comprehension: verbalized understanding, returned demonstration, verbal cues required, and  needs further education  HOME EXERCISE PROGRAM: Previously issued HEP per DC 12/02/22: All previous HEPs combined to 1 complete List through [employer] Access Code: [email or web address]  GOALS:   SHORT TERM GOALS: Target date: 10/07/2023   1. Patient will verbalize understanding of AE/modified techniques to improve independence and safety with ADL and IADL completion. Baseline: Caregiver/spouse assist Goal status: IN PROGRESS  2.  Pt will be independent with BUE braces/splints as needed to prevent contracture and improve functional use of hands.  Baseline: Caregiver/spouse assist Goal status: IN Progress  LONG TERM GOALS: Target  date: 11/04/2023  Patient will demonstrate independence with updated HEP for UE strengthening, coordination and ROM to prevent contractures and maintain strength for transfers and ADLs. Baseline: Previous HEPs have been established but need to be reviewed and updated.  Goal status: IN Progress  2.  Patient will report at least two-point increase in average PSFS score or at least three-point increase in a single activity score indicating functionally significant improvement given minimum detectable change. Baseline: 3.3 total score (See above for individual activity scores)  Goal status: IN Progress  3.  Patient will demonstrate at least 10 lbs R grip strength as needed to open jars and other containers. Baseline: 4.4 lbs Goal status: IN PROGRESS  ASSESSMENT:  CLINICAL IMPRESSION: Patient unable to recall alternative ArcEx application this session though it appears the unit she has for this will provide alternative stimulation with NMES as needed for functional application to upper extremities. Will confirm and educate on proper use during next visit as needed.  PERFORMANCE DEFICITS: in functional skills including ADLs, IADLs, coordination, dexterity, strength, muscle spasms, Fine motor control, Gross motor control, continence, skin integrity, and UE functional use,    IMPAIRMENTS: are limiting patient from ADLs, IADLs, work, and leisure.   CO-MORBIDITIES: has co-morbidities such as incontinence and wound  that affects occupational performance. Patient will benefit from skilled OT to address above impairments and improve overall function.  REHAB POTENTIAL: Fair due to chronicity of injury  PLAN:  OT FREQUENCY: 1-2x/week (pt agreeable to starting off at 2xweek then tapering with the idea that she will take a break from therapy following this therapy episode.)  OT DURATION: 8 weeks  PLANNED INTERVENTIONS: self care/ADL training, therapeutic exercise, therapeutic activity, neuromuscular re-education, manual therapy, passive range of motion, balance training, functional mobility training, splinting, patient/family education, energy conservation, coping strategies training, and DME and/or AE instructions  RECOMMENDED OTHER SERVICES: Patient was seen for PT evaluation today with treatment plans coordinated for 2x/week.  CONSULTED AND AGREED WITH PLAN OF CARE: Patient and family member/caregiver  PLAN FOR NEXT SESSION:  NMES instruction (does second unit have reciprocal option)  Reassess for change in goals with OT s/p Botox  injections  Check splints PRN Review/progress HEPs Explore FM tasks and establish weight shifting instruction for pressure relief.   Magnetic zipper jacket and other ADL AE  Altamease Asters, OT 10/07/2023, 2:23 PM

## 2023-10-07 NOTE — Therapy (Signed)
 OUTPATIENT PHYSICAL THERAPY NEURO TREATMENT Patient Name: [name] MRN: [id number] DOB:February 07, 1951, 73 y.o., female Today's Date: 10/07/2023   PCP: Reginal Capra, MD REFERRING PROVIDER: Celia Coles, MD  END OF SESSION:  PT End of Session - 10/07/23 1148     Visit Number 8    Number of Visits 17   with eval   Date for PT Re-Evaluation 11/18/23   to allow for scheduling delays   Authorization Type [employer] MEDICARE    PT Start Time 1146   from OT session   PT Stop Time 1234    PT Time Calculation (min) 48 min    Equipment Utilized During Treatment Gait belt    Activity Tolerance Patient tolerated treatment well    Behavior During Therapy [facility] for tasks assessed/performed                   Past Medical History:  Diagnosis Date   CERVICAL POLYP 03/11/2008   Qualifier: Diagnosis of  By: Ethel Henry MD, Joaquim Muir    Colon polyps 2005   on colonscopy Dr. Sandrea Cruel   Fibroid 2004   Per Dr. Alva Jewels   History of shingles    face and mouth   Hx of skin cancer, basal cell    Rosacea    Sciatica of left side 09/28/2013   Scoliosis    noted on mri done for back pain   Past Surgical History:  Procedure Laterality Date   BUNIONECTOMY     Patient Active Problem List   Diagnosis Date Noted   Buttock wound, left, subsequent encounter 03/16/2023   Bronchiectasis with acute exacerbation (HCC) 03/15/2023   Buttock wound, left, initial encounter 11/02/2022   Orthostatic hypotension 08/13/2022   Neurogenic bowel 05/03/2022   Spasticity 05/03/2022   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/03/2022   Nerve pain 05/03/2022   Medication monitoring encounter 01/08/2022   Neurogenic bladder 10/11/2021   Urinary incontinence 10/11/2021   ESBL (extended spectrum beta-lactamase) producing bacteria infection 10/09/2021   Recurrent UTI 10/09/2021   Quadriplegia, C5-C7 incomplete (HCC) 01/16/2021   History of spinal fracture 01/16/2021   Suprapubic catheter (HCC) 01/16/2021   Encounter for routine  gynecological examination 09/28/2013   Onychomycosis 09/28/2013   Foot deformity, acquired 03/26/2012   Encounter for preventive health examination 12/25/2010   ROSACEA 08/25/2009   Disturbance in sleep behavior 03/11/2008   SKIN CANCER, HX OF 03/11/2008   DYSURIA, HX OF 03/11/2008   Hyperlipidemia 02/10/2007   CERVICALGIA 02/10/2007    ONSET DATE: 08/29/2023 (referral date)  REFERRING DIAG: G82.54 (ICD-10-CM) - Incomplete quadriplegia at C5-C8 level (HCC)  THERAPY DIAG:  Muscle weakness (generalized)  Other symptoms and signs involving the musculoskeletal system  Abnormal posture  Quadriplegia, C5-C7 complete (HCC)  Rationale for Evaluation and Treatment: Rehabilitation  SUBJECTIVE:  SUBJECTIVE STATEMENT: Pt reports no acute changes since last visit. She went to wound care yesterday and they said her wound is healing well, she is now cleared to shower again! Pt reports that her R foot is doing well, does occasionally have a "twinge".   From initial eval: Pt familiar to this clinic, last seen Oct-Nov 2024 but has been seen for multiple POCs since her initial injury in 2022. Pt returns to this clinic after being in [place]  for the past few months. While in [place]  in December 2024 patient had a fall where she slid forwards out of her wheelchair onto the floor, ended up fracturing both of her femurs. Pt was hospitalized for 10-11 days and had surgical repair of her femurs. Pt reports she has been cleared of all restrictions since surgery, does have ongoing swelling in both legs and worsened spasticity. Pt reports that the spasticity has slightly improved since it initially started, was told by Dr. Lovorn the swelling may not resolve for 6-8 months (it has been 3 months) and not sure if spasticity will  resolve but patient is hopeful that if her swelling improves her spasticity will improve as well.  Pt asking about other options to help manage the swelling her legs, has tried variable compression stockings (knee high) and her PTs in [place]  recommended tight shapewear. Pt reports that the knee-high compression stockings led to increased swelling in her knees and the shapewear did not help her swelling. Encouraged patient to get thigh-high compression stockings and elevate her LE in PWC. Pt has ordered an articulating bed that will get here next Monday (3/31) so she can better elevate her legs when in bed.  Pt is also concerned about decreased ROM in her legs, [name] works on stretching her legs frequently but she feels she has more motion in her LLE as compared to RLE and may have mild foot drop on her R side. Pt does have PRAFOs to wear at night. Pt has worked up to standing in her standing frame x 30-40 min at a time. Pt also worked on standing with her PT and in // bars in [place] . Pt also reports with her injuries she lost the ability to lock/unlock her R knee but that it is getting better. She reports she lost a lot of stamina during her hospital stay as well.  Pt also has a new wound since last seen in this clinic in her L gluteal fold, shearing injury. Pt was seeing wound care in [place]  and is scheduled to see wound care with [facility] early April (was not able to schedule with [facility] wound care until late April).  Pt accompanied by: self and caregiver [name]  PERTINENT HISTORY: C7 ASIA C- incomplete quad w/ neurogenic bladder and bowel, HLD, Hx of skin cancer  PAIN:  Are you having pain? Yes: NPRS scale: 3-4 Pain location: elbows to fingertips on both arms Pain description: nerve pain Aggravating factors: not stated Relieving factors: not stated  PRECAUTIONS: Fall and Other: osteoporosis  RED FLAGS: None   WEIGHT BEARING RESTRICTIONS: No  FALLS: Has patient fallen in last 6 months? Yes.  Number of falls 1 fall in [place]  that resulted in B femur fractures  LIVING ENVIRONMENT: Lives with: lives with their spouse and and with full-time caregiver [name] Lives in: House/apartment Home is power wheelchair accessible Has following equipment at home: Wheelchair (power), Wheelchair (manual), Grab bars, Ramped entry, and standing frame, slide board  PLOF: Independent with household mobility with device, Independent with  community mobility with device, Requires assistive device for independence, Needs assistance with ADLs, and Needs assistance with transfers  PATIENT GOALS: "still working on stand and pivot with goal to pivot to commode or to a chair" "work on core-will help me with standing" "improve my stamina - being in the hospital I lost strength/endurance"   OBJECTIVE:  Note: Objective measures were completed at Evaluation unless otherwise noted.  DIAGNOSTIC FINDINGS: None update/relevant to this POC  COGNITION: Overall cognitive status: Within functional limits for tasks assessed   SENSATION: Decreased sensation in BUE and BLE secondary to incomplete quadriplegia Decreased sensation in proximal LLE as compared to distal LE  EDEMA:  Circumferential: R knee: 17"; L knee: 17.5" and Figure 8: R ankle 21", L ankle 21.5"  MUSCLE TONE: increased spasticity in BLE   POSTURE: rounded shoulders and forward head  LOWER EXTREMITY ROM:     Passive  Right Eval Left Eval  Hip flexion Tight hip flexors Tight hip flexors  Hip extension    Hip abduction    Hip adduction    Hip internal rotation    Hip external rotation    Knee flexion Tight HS Tight HS  Knee extension    Ankle dorsiflexion Decreased, tight gastroc Decreased, tight gastroc  Ankle plantarflexion    Ankle inversion    Ankle eversion     (Blank rows = not tested)  LOWER EXTREMITY MMT:    MMT Right Eval Left Eval  Hip flexion 1 2-  Hip extension    Hip abduction    Hip adduction    Hip internal  rotation    Hip external rotation    Knee flexion 0 3  Knee extension 2- 2-  Ankle dorsiflexion 2- 3  Ankle plantarflexion    Ankle inversion    Ankle eversion    (Blank rows = not tested)  BED MOBILITY:  From previous POC: Sit to supine Mod A Supine to sit Mod A Rolling to Right Mod A Rolling to Left Mod A Undulating mattress for wound management on standard bed (elevated-so often doing uphill sliding board transfers); she would like to continue working on sitting up independently, she has been working on rolling, needs less assistance w/ this when someone props her leg into hooklying; would like something to help her pull her left leg to her butt for stretching as well as bed mobility.  TRANSFERS: From previous POC: Pt continues using combination of bump over, slide board, and depression (squat pivot) transfers.                                                                                                                              TREATMENT:   TherAct For STG assessment: Eval: Circumferential: R knee: 17"; L knee: 17.5"  4/25: R knee 16", L knee: 16 11/16" Eval Figure 8: R ankle 21", L ankle 21.5" 4/25: R ankle: 21", L ankle 20"  Sit to stand in // bars  with max A for transfer: Standing x 2:35 min with mod A (knees blocked, onset of shakiness of quads with onset of fatigue, heavy UE reliance) Standing x 2:10 min with max A, focus on glute activation and lateral weight shift L/R  Slide board transfer PWC to mat table with min to mod A. Focus on use of push-up blocks to lift buttocks up from mat table. Pt is able to perform x 3 reps with blocks placed anteriorly to her hips at edge of mat table. Pt able to perform push-ups x 2 reps with blocked placed more posteriorly in line with her hips, however due to body habitus she is unable to increase buttocks clearance from mat table due to length of her arms. Pt also has a posterior LOB in sitting to mat table with initial push-up  with blocks in more posterior position, max A to return to sitting. Pt can benefit from further practice of use of push-up blocks for UE strengthening and to work towards increased independence with bump transfers. Bump transfer back to w/c with mod A up to x 2. Pt left seated in PWC with [name] at end of session.    PATIENT EDUCATION: Education details: continue PROM at home, results of LE edema measurements as compared to initial eval Person educated: Patient and Caregiver [name] Education method: Explanation Education comprehension: verbalized understanding  HOME EXERCISE PROGRAM: Will be established as needed as pt has done continuous therapy and is working towards functional tasks.   GOALS: Goals reviewed with patient? Yes  SHORT TERM GOALS: Target date: 10/07/2023  HEP to be established for stretching and strengthening as appropriate. Baseline: not established at initial eval, reviewed during PT POC - pt and [name] report it is going well at home (4/25) Goal status: MET  2.  Pt will perform sit to stand transfer with LRAD with mod A Baseline: max A to stedy (4/4), max A in // bars (4/25) Goal status: IN PROGRESS  3.  Pt to tolerate standing x 5 min with LRAD to demonstrate improved endurance Baseline: 30 sec in stedy (initial), 2:35 min in // bars (4/25) Goal status: IN PROGRESS  4.  Pt to demonstrate reduced edema in BLE with a reduction in circumference/figure 8 measurement by 0.5" from initial evaluation. Baseline: Circumferential: R knee: 17"; L knee: 17.5" and Figure 8: R ankle 21", L ankle 21.5" 4/25: R knee 16", L knee: 16 11/16", R ankle: 21", L ankle 20" Goal status: IN PROGRESS   LONG TERM GOALS: Target date: 11/04/2023   Patient and her caregiver to be independent with performance of HEP for stretching and strengthening as appropriate. Baseline: not established at initial eval, reviewed during PT POC - pt and [name] report it is going well at home (4/25) Goal  status: INITIAL  2.  Pt will perform sit to stand transfer with LRAD with min A Baseline: max A to stedy (4/4), max A in // bars (4/25) Goal status: INITIAL  3.  Pt to tolerate standing x 5 min with LRAD to demonstrate improved endurance Baseline: 30 sec in stedy (initial), 2:35 min in // bars (4/25) Goal status: REVISED  4.  Pt to demonstrate reduced edema in BLE with a reduction in circumference/figure 8 measurement by 1" from initial evaluation. Baseline: Circumferential: R knee: 17"; L knee: 17.5" and Figure 8: R ankle 21", L ankle 21.5" 4/25: R knee 16", L knee: 16 11/16", R ankle: 21", L ankle 20" Goal status: INITIAL    ASSESSMENT:  CLINICAL IMPRESSION: Emphasis of skilled PT session on assessing STG. Pt has met 1/1 STG as she and her caregiver [name] are comfortable with her current home stretching program. Pt has made progress from standing with stedy to standing in // bars, however she can require up to max A due to ongoing weakness and quick onset of fatigue. Additionally, she has progressed from standing x 30 sec initially in the stedy to standing x 2:35 min in // bars, did not quite meet goal of 5 min so LTG was revised. Finally, she does exhibit improved edema in her BLE with improvements in all joints assessed except for her R ankle which is understandable given she had a recent mild R ankle injury. Patient continues to benefit from skilled PT services to work towards LTGs. Will continue per POC.    OBJECTIVE IMPAIRMENTS: decreased balance, decreased endurance, decreased mobility, difficulty walking, decreased ROM, decreased strength, increased edema, impaired perceived functional ability, increased muscle spasms, impaired flexibility, impaired sensation, impaired tone, impaired UE functional use, postural dysfunction, and pain.   ACTIVITY LIMITATIONS: carrying, lifting, bending, standing, stairs, transfers, bed mobility, continence, bathing, toileting, dressing, reach over  head, and hygiene/grooming  PARTICIPATION LIMITATIONS: meal prep, cleaning, laundry, driving, shopping, community activity, and occupation  PERSONAL FACTORS: Age, Sex, Time since onset of injury/illness/exacerbation, and 1-2 comorbidities:    C7 ASIA C- incomplete quad w/ neurogenic bladder and bowel, HLD, Hx of skin cancerare also affecting patient's functional outcome.   REHAB POTENTIAL: Good  CLINICAL DECISION MAKING: Stable/uncomplicated  EVALUATION COMPLEXITY: High  PLAN:  PT FREQUENCY: 2x/week  PT DURATION: 8 weeks  PLANNED INTERVENTIONS: [id number]- PT Re-evaluation, 97110-Therapeutic exercises, 97530- Therapeutic activity, 97112- Neuromuscular re-education, 97535- Self Care, 13086- Manual therapy, [phone]- Gait training, [phone]- Orthotic Fit/training, [phone]- Electrical stimulation (manual), Patient/Family education, Balance training, Stair training, Taping, Dry Needling, Joint mobilization, Scar mobilization, Compression bandaging, DME instructions, Wheelchair mobility training, Cryotherapy, and Moist heat  PLAN FOR NEXT SESSION: any questions over PROM/stretching? sit to stands, standing tolerance with stedy, in // bars, possibly with RW, core strengthening/stability, endurance; wants to have conversation about taking a break (3-6 months) after this POC near end of this POC, standing lateral weight shifts, mini-squats in standing?, work on bump transfers, assess cushions if patient brings them - TT to reach out to [facility] to set this up, Yoga block press-ups vs push-up blocks  Lorita Rosa, PT Lorita Rosa, PT, DPT, CSRS   10/07/2023, 12:34 PM

## 2023-10-10 ENCOUNTER — Ambulatory Visit: Admitting: Occupational Therapy

## 2023-10-10 ENCOUNTER — Ambulatory Visit: Admitting: Physical Therapy

## 2023-10-10 DIAGNOSIS — R29818 Other symptoms and signs involving the nervous system: Secondary | ICD-10-CM | POA: Diagnosis not present

## 2023-10-10 DIAGNOSIS — R29898 Other symptoms and signs involving the musculoskeletal system: Secondary | ICD-10-CM

## 2023-10-10 DIAGNOSIS — M6281 Muscle weakness (generalized): Secondary | ICD-10-CM

## 2023-10-10 DIAGNOSIS — R2689 Other abnormalities of gait and mobility: Secondary | ICD-10-CM | POA: Diagnosis not present

## 2023-10-10 DIAGNOSIS — M24541 Contracture, right hand: Secondary | ICD-10-CM

## 2023-10-10 DIAGNOSIS — M24542 Contracture, left hand: Secondary | ICD-10-CM | POA: Diagnosis not present

## 2023-10-10 DIAGNOSIS — R293 Abnormal posture: Secondary | ICD-10-CM

## 2023-10-10 DIAGNOSIS — R278 Other lack of coordination: Secondary | ICD-10-CM | POA: Diagnosis not present

## 2023-10-10 DIAGNOSIS — G8254 Quadriplegia, C5-C7 incomplete: Secondary | ICD-10-CM | POA: Diagnosis not present

## 2023-10-10 DIAGNOSIS — G8253 Quadriplegia, C5-C7 complete: Secondary | ICD-10-CM

## 2023-10-10 DIAGNOSIS — R208 Other disturbances of skin sensation: Secondary | ICD-10-CM

## 2023-10-10 DIAGNOSIS — M25641 Stiffness of right hand, not elsewhere classified: Secondary | ICD-10-CM

## 2023-10-10 NOTE — Therapy (Signed)
 OUTPATIENT OCCUPATIONAL THERAPY NEURO TREATMENT  Patient Name: [name] MRN: [id number] DOB:1951-03-07, 73 y.o., female Today's Date: 10/10/2023  PCP: Reginal Capra, MD  REFERRING PROVIDER: Celia Coles, MD  END OF SESSION:  OT End of Session - 10/10/23 1107     Visit Number 9    Number of Visits 17    Date for OT Re-Evaluation 11/04/23    Authorization Type [employer] - auth approved    Authorization Time Period 09/09/2023 - 11/04/2023    OT Start Time 1107    OT Stop Time 1210    OT Time Calculation (min) 63 min    Activity Tolerance Patient tolerated treatment well    Behavior During Therapy [facility] for tasks assessed/performed            Past Medical History:  Diagnosis Date   CERVICAL POLYP 03/11/2008   Qualifier: Diagnosis of  By: Ethel Henry MD, Joaquim Muir    Colon polyps 2005   on colonscopy Dr. Sandrea Cruel   Fibroid 2004   Per Dr. Alva Jewels   History of shingles    face and mouth   Hx of skin cancer, basal cell    Rosacea    Sciatica of left side 09/28/2013   Scoliosis    noted on mri done for back pain   Past Surgical History:  Procedure Laterality Date   BUNIONECTOMY     Patient Active Problem List   Diagnosis Date Noted   Buttock wound, left, subsequent encounter 03/16/2023   Bronchiectasis with acute exacerbation (HCC) 03/15/2023   Buttock wound, left, initial encounter 11/02/2022   Orthostatic hypotension 08/13/2022   Neurogenic bowel 05/03/2022   Spasticity 05/03/2022   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/03/2022   Nerve pain 05/03/2022   Medication monitoring encounter 01/08/2022   Neurogenic bladder 10/11/2021   Urinary incontinence 10/11/2021   ESBL (extended spectrum beta-lactamase) producing bacteria infection 10/09/2021   Recurrent UTI 10/09/2021   Quadriplegia, C5-C7 incomplete (HCC) 01/16/2021   History of spinal fracture 01/16/2021   Suprapubic catheter (HCC) 01/16/2021   Encounter for routine gynecological examination 09/28/2013    Onychomycosis 09/28/2013   Foot deformity, acquired 03/26/2012   Encounter for preventive health examination 12/25/2010   ROSACEA 08/25/2009   Disturbance in sleep behavior 03/11/2008   SKIN CANCER, HX OF 03/11/2008   DYSURIA, HX OF 03/11/2008   Hyperlipidemia 02/10/2007   CERVICALGIA 02/10/2007    ONSET DATE: 07/28/2020  Date of Referral 08/29/2023   REFERRING DIAG: G82.54 (ICD-10-CM) - Quadriplegia, C5-C8, incomplete  THERAPY DIAG:  Muscle weakness (generalized)  Other symptoms and signs involving the musculoskeletal system  Abnormal posture  Other lack of coordination  Other symptoms and signs involving the nervous system  Contracture of hand joint, left  Contracture of hand joint, right  Stiffness of right hand, not elsewhere classified  Other disturbances of skin sensation  Rationale for Evaluation and Treatment: Rehabilitation  SUBJECTIVE:   SUBJECTIVE STATEMENT: Pt has instructions for alternative ArcEx use. She reports she can no longer press coffee tumbler open with left thumb as she could before following botox . She has not noticed any significant changes in R digit ROM.    Pt accompanied by:  Therese Flash - husband   PERTINENT HISTORY: "Pt is a 73 yr old L handed female with hx of incomplete quadriplegia- 2/14 2022- fleeing the police in [place] on passenger 100 (high speed) miles/hour,  [name]; neurogenic bowel and bladder and spasticity; [name], [name] Here for f/u on  Incomplete quadriplegia"  B femur fractures December, 2024.  PRECAUTIONS: Fall; suprapubic catheter (she wants to get this removed meaning she needs to get to and from the toilet); she has had minor heat sensation when needing to complete her bowel program-possible AD?   WEIGHT BEARING RESTRICTIONS: No  PAIN: - reports average pain as noted below. Will notify therapist if there are changes in her pain.  Are you having pain? Yes: NPRS scale: 3/10 Pain location: fingers  to elbow bilaterally Pain description: constant Aggravating factors: it can increased over time ie) is worse at the end of the day.  Also cold affects cramps and function. Relieving factors: gabapentin  and baclofen  for spasms, nightly stretching  FALLS: Has patient fallen in last 6 months? Yes. Number of falls 1  LIVING ENVIRONMENT: Lives with: lives with their family - husband [name] and with an adult companion s/p moving back up from [place]  x10 months Lives in: House/apartment Stairs:  4 story town house with an Engineer, structural with threshold adjustments, roll in shower with transport chair Has following equipment at home: Wheelchair (power) - with seat height adjustments to access counters and reclining option, Wheelchair (manual), transport WC, shower chair, and Ramped entry, handheld showerhead with rails around toilet, had Alen Amy but is no longer in need of it, has slide boards x3  PLOF: Requires assistive device for independence, Needs assistance with ADLs, Needs assistance with homemaking, Needs assistance with gait, and Needs assistance with transfers; 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  Used to like to knit, sew and bake.  PATIENT GOALS: improve spasticity and use of hands  OBJECTIVE:   HAND DOMINANCE: Left  ADLs: Overall ADLs: Patient has a live in caregiver  Transfers/ambulation related to ADLs: min assist with sliding board transfers.  Eating: Has a rocker knife that she can use. Used to use adapted utensils but now uses regular utensils but still will get assistance to cut food ie) when eating out.  Grooming: can brush her own hair with LUE only; unable to manage jewelry ie) earrings  UB Dressing: can zip/unzip after it has been started, unable to manage buttons herself, Caregiver assists but if she has extra time, she can put on her bra, and a loose fitting pullover shirt/t-shirt  LB Dressing: dependent for LB dressing in bed and with special sock donner for  LE compression garments   Toileting: bladder trained with suprapubic catheter which she clamps off.  Dependent for bowel incontinence care.  Bathing: Sponge bath with adult washclothes.  Can bathe UB with back scrubber for most of her back.  Needs help with feet (mentioned she might need a separate brush for feet)   Tub Shower transfers: Min assist with slide board to wheel in shower chair  Equipment: Shower seat with back, Walk in shower, bed side commode, Reacher, Sock aid, Long handled sponge, and Feeding equipment  IADLs: --  Shopping: Assisted by caregiver  Light housekeeping: Has housekeeper that comes monthly  Meal Prep: previously enjoyed baking. Assisted by caregiver but has reheated a meal for herself after getting food out of the fridge/freezer from her WC.  Community mobility: Dependent  Medication management: Caregiver sorts them into pillbox but she is very aware of her medications   Financial management: Patient manages her own finances  Handwriting: Increased time and has a pen with a little grip  MOBILITY STATUS: Independent with power mobility  ACTIVITY TOLERANCE: Activity tolerance: good to Fair - MMT WFL but has limited sustained  tolerance for ongoing use of Ues with poor trunk control  FUNCTIONAL OUTCOME MEASURES:  PSFS: 3.3 total score Hair Care 6 Cutting chicken 4 Opening Body Armor drink 0  Total score = sum of the activity scores/number of activities Minimum detectable change (90%CI) for average score = 2 points Minimum detectable change (90%CI) for single activity score = 3 points   UPPER EXTREMITY ROM:   AROM - WFL without obvious contractures, some digital flexion noted but PROM WNL   UPPER EXTREMITY MMT:   Grossly WFL - Endurance limited R tricep strength > than L but L UE generally stronger than R UE  MMT Right (eval) Left (eval)  Shoulder flexion 4/5 4/5  Shoulder abduction 4/5 4/5  Elbow flexion 4/5 4/5  Elbow extension 4/5 4/5   (Blank rows = not tested)  HAND FUNCTION: Grip strength: Right: 4.4 lbs (decline) ; Left: 18 lbs (slight improvement)  COORDINATION: 09/29/23 s/p Botox  injections yesterday  Left: 56.13 sec Right 3:39.58 min  SENSATION: Light touch: Impaired  - patient   EDEMA: NA for UEs but LE has poor lymph drainage with custom compression garments   MUSCLE TONE: Generally WFL   COGNITION: Overall cognitive status: Within functional limits for tasks assessed  VISION: Subjective report: Patent wears progressive lens/glasses.  Denies diplopia or vision changes. Baseline vision: Wears glasses all the time  VISION ASSESSMENT: WFL  OBSERVATIONS: Patient independent with power WC navigation within clinic.  Patient is well-kept with foley catheter in place.  She has slight limitations in full extension of digits but PROM is WNL.    TODAY'S TREATMENT:                                                                                            - Neuro re-education completed for duration as noted below including: OT completed alternative ArcEx applications as noted in pt instructions. While in use, pt completed B turning of Jumbo cards at table to encourage improved fine motor coordination and functional use.  OT educated on application for reciprocal L wrist and digit extension as well as digit flexion. Electrodes were applied and NMES was utilized for improved ROM and coordination. Patient verbalized understanding. Educated pt on how to switch between symmetrical and alternating settings for ArcEx and NMES with functional grasp and release respectively.  - Self-care/home management completed for duration as noted below including: OT educated on use of Gfloss, hands free flossing option.    OT reviewed Botox  management and adjustments per pt subjective and expressed concerns.   PATIENT EDUCATION: Education details: NMES use; adaptive devices Person educated: Patient and Caregiver husband Scientist, forensic Education method: Explanation, Demonstration, and Verbal cues Education comprehension: verbalized understanding, returned demonstration, verbal cues required, and needs further education  HOME EXERCISE PROGRAM: Previously issued HEP per DC 12/02/22: All previous HEPs combined to 1 complete List through [employer] Access Code: [email or web address] 10/10/2023: Alternative ArcEx application guidance  GOALS:   SHORT TERM GOALS: Target date: 10/07/2023   1. Patient will verbalize understanding of AE/modified techniques to improve independence and safety with ADL and IADL completion. Baseline: Caregiver/spouse assist Goal status: IN PROGRESS  2.  Pt will be independent with BUE braces/splints as needed to prevent contracture and improve functional use of hands.  Baseline: Caregiver/spouse assist Goal status: IN Progress  LONG TERM GOALS: Target date: 11/04/2023  Patient will demonstrate independence with updated HEP for UE strengthening, coordination and ROM to prevent contractures and maintain strength for transfers and ADLs. Baseline: Previous HEPs have been established but need to be reviewed and updated.  Goal status: IN Progress  2.  Patient will report at least two-point increase in average PSFS score or at least three-point increase in a single activity score indicating functionally significant improvement given minimum detectable change. Baseline: 3.3 total score (See above for individual activity scores)  Goal status: IN Progress  3.  Patient will demonstrate at least 10 lbs R grip strength as needed to open jars and other containers. Baseline: 4.4 lbs Goal status: IN PROGRESS  ASSESSMENT:  CLINICAL IMPRESSION: Patient tolerated ArcEx application with coordination activity as well as alternating NMES application for L wrist flexion and extension. Will require repeat instruction for accurate carryover. Pt's live-in caregiver is to be at her next visit and will be the primary caregiver to  help pt with use.   PERFORMANCE DEFICITS: in functional skills including ADLs, IADLs, coordination, dexterity, strength, muscle spasms, Fine motor control, Gross motor control, continence, skin integrity, and UE functional use,   IMPAIRMENTS: are limiting patient from ADLs, IADLs, work, and leisure.   CO-MORBIDITIES: has co-morbidities such as incontinence and wound  that affects occupational performance. Patient will benefit from skilled OT to address above impairments and improve overall function.  REHAB POTENTIAL: Fair due to chronicity of injury  PLAN:  OT FREQUENCY: 1-2x/week (pt agreeable to starting off at 2xweek then tapering with the idea that she will take a break from therapy following this therapy episode.)  OT DURATION: 8 weeks  PLANNED INTERVENTIONS: self care/ADL training, therapeutic exercise, therapeutic activity, neuromuscular re-education, manual therapy, passive range of motion, balance training, functional mobility training, splinting, patient/family education, energy conservation, coping strategies training, and DME and/or AE instructions  RECOMMENDED OTHER SERVICES: Patient was seen for PT evaluation today with treatment plans coordinated for 2x/week.  CONSULTED AND AGREED WITH PLAN OF CARE: Patient and family member/caregiver  PLAN FOR NEXT SESSION:  NMES instruction (review second unit reciprocal option)  Reassess for change in goals with OT s/p Botox  injections  Check splints PRN Review/progress HEPs Explore FM tasks and establish weight shifting instruction for pressure relief.   Magnetic zipper jacket and other ADL AE  Altamease Asters, OT 10/10/2023, 12:48 PM

## 2023-10-10 NOTE — Therapy (Signed)
 OUTPATIENT PHYSICAL THERAPY NEURO TREATMENT Patient Name: [name] MRN: [id number] DOB:[birth year], 73 y.o., female Today's Date: 10/10/2023   PCP: Reginal Capra, MD REFERRING PROVIDER: Celia Coles, MD  END OF SESSION:  PT End of Session - 10/10/23 1013     Visit Number 9    Number of Visits 17   with eval   Date for PT Re-Evaluation 11/18/23   to allow for scheduling delays   Authorization Type [employer] MEDICARE    PT Start Time 1013    PT Stop Time 1100    PT Time Calculation (min) 47 min    Equipment Utilized During Treatment Gait belt    Activity Tolerance Patient tolerated treatment well    Behavior During Therapy WFL for tasks assessed/performed                    Past Medical History:  Diagnosis Date   CERVICAL POLYP 03/11/2008   Qualifier: Diagnosis of  By: Ethel Henry MD, Joaquim Muir    Colon polyps 2005   on colonscopy Dr. Sandrea Cruel   Fibroid 2004   Per Dr. Alva Jewels   History of shingles    face and mouth   Hx of skin cancer, basal cell    Rosacea    Sciatica of left side 09/28/2013   Scoliosis    noted on mri done for back pain   Past Surgical History:  Procedure Laterality Date   BUNIONECTOMY     Patient Active Problem List   Diagnosis Date Noted   Buttock wound, left, subsequent encounter 03/16/2023   Bronchiectasis with acute exacerbation (HCC) 03/15/2023   Buttock wound, left, initial encounter 11/02/2022   Orthostatic hypotension 08/13/2022   Neurogenic bowel 05/03/2022   Spasticity 05/03/2022   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/03/2022   Nerve pain 05/03/2022   Medication monitoring encounter 01/08/2022   Neurogenic bladder 10/11/2021   Urinary incontinence 10/11/2021   ESBL (extended spectrum beta-lactamase) producing bacteria infection 10/09/2021   Recurrent UTI 10/09/2021   Quadriplegia, C5-C7 incomplete (HCC) 01/16/2021   History of spinal fracture 01/16/2021   Suprapubic catheter (HCC) 01/16/2021   Encounter for routine gynecological  examination 09/28/2013   Onychomycosis 09/28/2013   Foot deformity, acquired 03/26/2012   Encounter for preventive health examination 12/25/2010   ROSACEA 08/25/2009   Disturbance in sleep behavior 03/11/2008   SKIN CANCER, HX OF 03/11/2008   DYSURIA, HX OF 03/11/2008   Hyperlipidemia 02/10/2007   CERVICALGIA 02/10/2007    ONSET DATE: 08/29/2023 (referral date)  REFERRING DIAG: G82.54 (ICD-10-CM) - Incomplete quadriplegia at C5-C8 level (HCC)  THERAPY DIAG:  Muscle weakness (generalized)  Other symptoms and signs involving the musculoskeletal system  Abnormal posture  Quadriplegia, C5-C7 complete (HCC)  Rationale for Evaluation and Treatment: Rehabilitation  SUBJECTIVE:  SUBJECTIVE STATEMENT: Pt reports no acute changes since last visit. Pt was finally able to shower over the weekend and it was "heaven".   From initial eval: Pt familiar to this clinic, last seen Oct-Nov 2024 but has been seen for multiple POCs since her initial injury in 2022. Pt returns to this clinic after being in [place]  for the past few months. While in [place]  in December 2024 patient had a fall where she slid forwards out of her wheelchair onto the floor, ended up fracturing both of her femurs. Pt was hospitalized for 10-11 days and had surgical repair of her femurs. Pt reports she has been cleared of all restrictions since surgery, does have ongoing swelling in both legs and worsened spasticity. Pt reports that the spasticity has slightly improved since it initially started, was told by Dr. Lovorn the swelling may not resolve for 6-8 months (it has been 3 months) and not sure if spasticity will resolve but patient is hopeful that if her swelling improves her spasticity will improve as well.  Pt asking about other options  to help manage the swelling her legs, has tried variable compression stockings (knee high) and her PTs in [place]  recommended tight shapewear. Pt reports that the knee-high compression stockings led to increased swelling in her knees and the shapewear did not help her swelling. Encouraged patient to get thigh-high compression stockings and elevate her LE in PWC. Pt has ordered an articulating bed that will get here next Monday (3/31) so she can better elevate her legs when in bed.  Pt is also concerned about decreased ROM in her legs, [name] works on stretching her legs frequently but she feels she has more motion in her LLE as compared to RLE and may have mild foot drop on her R side. Pt does have PRAFOs to wear at night. Pt has worked up to standing in her standing frame x 30-40 min at a time. Pt also worked on standing with her PT and in // bars in [place] . Pt also reports with her injuries she lost the ability to lock/unlock her R knee but that it is getting better. She reports she lost a lot of stamina during her hospital stay as well.  Pt also has a new wound since last seen in this clinic in her L gluteal fold, shearing injury. Pt was seeing wound care in [place]  and is scheduled to see wound care with [facility] early April (was not able to schedule with [facility] wound care until late April).  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 Yes: NPRS scale: 3-4 Pain location: elbows to fingertips on both arms Pain description: nerve pain Aggravating factors: not stated Relieving factors: not stated  PRECAUTIONS: Fall and Other: osteoporosis  RED FLAGS: None   WEIGHT BEARING RESTRICTIONS: No  FALLS: Has patient fallen in last 6 months? Yes. Number of falls 1 fall in [place]  that resulted in B femur fractures  LIVING ENVIRONMENT: Lives with: lives with their spouse and  and with full-time caregiver [name] Lives in: House/apartment Home is power wheelchair accessible Has following equipment at home: Wheelchair (power), Wheelchair (manual), Grab bars, Ramped entry, and standing frame, slide board  PLOF: Independent with household mobility with device, Independent with community mobility with device, Requires assistive device for independence, Needs assistance with ADLs, and Needs assistance with transfers  PATIENT GOALS: "still  working on stand and pivot with goal to pivot to commode or to a chair" "work on core-will help me with standing" "improve my stamina - being in the hospital I lost strength/endurance"   OBJECTIVE:  Note: Objective measures were completed at Evaluation unless otherwise noted.  DIAGNOSTIC FINDINGS: None update/relevant to this POC  COGNITION: Overall cognitive status: Within functional limits for tasks assessed   SENSATION: Decreased sensation in BUE and BLE secondary to incomplete quadriplegia Decreased sensation in proximal LLE as compared to distal LE  EDEMA:  Circumferential: R knee: 17"; L knee: 17.5" and Figure 8: R ankle 21", L ankle 21.5"  MUSCLE TONE: increased spasticity in BLE   POSTURE: rounded shoulders and forward head  LOWER EXTREMITY ROM:     Passive  Right Eval Left Eval  Hip flexion Tight hip flexors Tight hip flexors  Hip extension    Hip abduction    Hip adduction    Hip internal rotation    Hip external rotation    Knee flexion Tight HS Tight HS  Knee extension    Ankle dorsiflexion Decreased, tight gastroc Decreased, tight gastroc  Ankle plantarflexion    Ankle inversion    Ankle eversion     (Blank rows = not tested)  LOWER EXTREMITY MMT:    MMT Right Eval Left Eval  Hip flexion 1 2-  Hip extension    Hip abduction    Hip adduction    Hip internal rotation    Hip external rotation    Knee flexion 0 3  Knee extension 2- 2-  Ankle dorsiflexion 2- 3  Ankle plantarflexion    Ankle  inversion    Ankle eversion    (Blank rows = not tested)  BED MOBILITY:  From previous POC: Sit to supine Mod A Supine to sit Mod A Rolling to Right Mod A Rolling to Left Mod A Undulating mattress for wound management on standard bed (elevated-so often doing uphill sliding board transfers); she would like to continue working on sitting up independently, she has been working on rolling, needs less assistance w/ this when someone props her leg into hooklying; would like something to help her pull her left leg to her butt for stretching as well as bed mobility.  TRANSFERS: From previous POC: Pt continues using combination of bump over, slide board, and depression (squat pivot) transfers.                                                                                                                              TREATMENT:   TherAct Pt received seated in her PWC. Slide board transfer PWC to mat table with min A. Session focus on core strengthening and UB strengthening: Seated press-ups with press up blocks x 5 reps Pt with improved lift with blocks placed more anteriorly towards front of mat table as opposed to more posteriorly Seated lateral leans while reaching outside BOS then across midline x 5 reps  each with alt UE for squigz Pt has to utilize both UE to return to upright sitting from her L side Seated anteriolateral leans while reaching outside BOS then across midline x 5 reps each with alt UE for squigz on 4" and 6" blocks on floor Pt has to utilize BUE pressing up on mat table to return to upright sitting Seated unweighted dowel chest press x 10 reps Seated unweighted dowel bicep curls x 10 reps  Slide board transfer back to PWC with min A at end of session. Pt left seated in PWC and handed off to OT.    PATIENT EDUCATION: Education details: continue PROM at home Person educated: Patient and Spouse [name] Education method: Explanation Education comprehension: verbalized  understanding  HOME EXERCISE PROGRAM: Will be established as needed as pt has done continuous therapy and is working towards functional tasks.   GOALS: Goals reviewed with patient? Yes  SHORT TERM GOALS: Target date: 10/07/2023  HEP to be established for stretching and strengthening as appropriate. Baseline: not established at initial eval, reviewed during PT POC - pt and [name] report it is going well at home (4/25) Goal status: MET  2.  Pt will perform sit to stand transfer with LRAD with mod A Baseline: max A to stedy (4/4), max A in // bars (4/25) Goal status: IN PROGRESS  3.  Pt to tolerate standing x 5 min with LRAD to demonstrate improved endurance Baseline: 30 sec in stedy (initial), 2:35 min in // bars (4/25) Goal status: IN PROGRESS  4.  Pt to demonstrate reduced edema in BLE with a reduction in circumference/figure 8 measurement by 0.5" from initial evaluation. Baseline: Circumferential: R knee: 17"; L knee: 17.5" and Figure 8: R ankle 21", L ankle 21.5" 4/25: R knee 16", L knee: 16 11/16", R ankle: 21", L ankle 20" Goal status: IN PROGRESS   LONG TERM GOALS: Target date: 11/04/2023   Patient and her caregiver to be independent with performance of HEP for stretching and strengthening as appropriate. Baseline: not established at initial eval, reviewed during PT POC - pt and [name] report it is going well at home (4/25) Goal status: INITIAL  2.  Pt will perform sit to stand transfer with LRAD with min A Baseline: max A to stedy (4/4), max A in // bars (4/25) Goal status: INITIAL  3.  Pt to tolerate standing x 5 min with LRAD to demonstrate improved endurance Baseline: 30 sec in stedy (initial), 2:35 min in // bars (4/25) Goal status: REVISED  4.  Pt to demonstrate reduced edema in BLE with a reduction in circumference/figure 8 measurement by 1" from initial evaluation. Baseline: Circumferential: R knee: 17"; L knee: 17.5" and Figure 8: R ankle 21", L ankle  21.5" 4/25: R knee 16", L knee: 16 11/16", R ankle: 21", L ankle 20" Goal status: INITIAL    ASSESSMENT:  CLINICAL IMPRESSION: Emphasis of skilled PT session on working on core and UB strengthening while seated EOM performing reaching outside BOS and across midline. Pt with increased weakness and difficulty reaching with her RUE as compared to her LUE. Pt does have difficulty returning from L lateral lean on mat table and has to utilize her RUE to assist with this. Patient continues to benefit from skilled PT services to work towards LTGs. Will continue per POC.    OBJECTIVE IMPAIRMENTS: decreased balance, decreased endurance, decreased mobility, difficulty walking, decreased ROM, decreased strength, increased edema, impaired perceived functional ability, increased muscle spasms, impaired flexibility, impaired  sensation, impaired tone, impaired UE functional use, postural dysfunction, and pain.   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   REHAB POTENTIAL: Good  CLINICAL DECISION MAKING: Stable/uncomplicated  EVALUATION COMPLEXITY: High  PLAN:  PT FREQUENCY: 2x/week  PT DURATION: 8 weeks  PLANNED INTERVENTIONS: 97164- PT Re-evaluation, 97110-Therapeutic exercises, 97530- Therapeutic activity, 97112- Neuromuscular re-education, 97535- Self Care, [id number]- Manual therapy, [phone]- Gait training, [phone]- Orthotic Fit/training, [phone]- Electrical stimulation (manual), Patient/Family education, Balance training, Stair  training, Taping, Dry Needling, Joint mobilization, Scar mobilization, Compression bandaging, DME instructions, Wheelchair mobility training, Cryotherapy, and Moist heat  PLAN FOR NEXT SESSION: 10th PN, any questions over PROM/stretching? sit to stands, standing tolerance with stedy, in // bars, possibly with RW, core strengthening/stability, endurance; wants to have conversation about taking a break (3-6 months) after this POC near end of this POC, standing lateral weight shifts, mini-squats in standing?, work on bump transfers, assess cushions if patient brings them - TT to reach out to [facility] to set this up, Yoga block press-ups vs push-up blocks  Lorita Rosa, PT Lorita Rosa, PT, DPT, CSRS   10/10/2023, 11:09 AM

## 2023-10-12 ENCOUNTER — Ambulatory Visit: Admitting: Physical Therapy

## 2023-10-12 ENCOUNTER — Ambulatory Visit: Admitting: Occupational Therapy

## 2023-10-12 ENCOUNTER — Encounter: Payer: Self-pay | Admitting: Physical Therapy

## 2023-10-12 DIAGNOSIS — R29818 Other symptoms and signs involving the nervous system: Secondary | ICD-10-CM

## 2023-10-12 DIAGNOSIS — M24541 Contracture, right hand: Secondary | ICD-10-CM | POA: Diagnosis not present

## 2023-10-12 DIAGNOSIS — R29898 Other symptoms and signs involving the musculoskeletal system: Secondary | ICD-10-CM | POA: Diagnosis not present

## 2023-10-12 DIAGNOSIS — R2689 Other abnormalities of gait and mobility: Secondary | ICD-10-CM | POA: Diagnosis not present

## 2023-10-12 DIAGNOSIS — R293 Abnormal posture: Secondary | ICD-10-CM

## 2023-10-12 DIAGNOSIS — R278 Other lack of coordination: Secondary | ICD-10-CM

## 2023-10-12 DIAGNOSIS — M24542 Contracture, left hand: Secondary | ICD-10-CM

## 2023-10-12 DIAGNOSIS — M6281 Muscle weakness (generalized): Secondary | ICD-10-CM | POA: Diagnosis not present

## 2023-10-12 DIAGNOSIS — G8254 Quadriplegia, C5-C7 incomplete: Secondary | ICD-10-CM | POA: Diagnosis not present

## 2023-10-12 NOTE — Therapy (Signed)
 OUTPATIENT PHYSICAL THERAPY NEURO TREATMENT - 10th VISIT PN Patient Name: [name] MRN: [id number] DOB:[birth year], 73 y.o., female Today's Date: 10/12/2023   PCP: Reginal Capra, MD REFERRING PROVIDER: Celia Coles, MD  PT progress note for [name].  Reporting period 09/09/2023 to 10/12/2023  See Note below for Objective Data and Assessment of Progress/Goals  Thank you for the referral of this patient. Marilou Showman, PT, DPT   END OF SESSION:  PT End of Session - 10/12/23 1014     Visit Number 10    Number of Visits 17   with eval   Date for PT Re-Evaluation 11/18/23   to allow for scheduling delays   Authorization Type [employer] MEDICARE    PT Start Time 1015    PT Stop Time 1102    PT Time Calculation (min) 47 min    Equipment Utilized During Treatment Gait belt    Activity Tolerance Patient tolerated treatment well    Behavior During Therapy WFL for tasks assessed/performed                    Past Medical History:  Diagnosis Date   CERVICAL POLYP 03/11/2008   Qualifier: Diagnosis of  By: Ethel Henry MD, Joaquim Muir    Colon polyps 2005   on colonscopy Dr. Sandrea Cruel   Fibroid 2004   Per Dr. Alva Jewels   History of shingles    face and mouth   Hx of skin cancer, basal cell    Rosacea    Sciatica of left side 09/28/2013   Scoliosis    noted on mri done for back pain   Past Surgical History:  Procedure Laterality Date   BUNIONECTOMY     Patient Active Problem List   Diagnosis Date Noted   Buttock wound, left, subsequent encounter 03/16/2023   Bronchiectasis with acute exacerbation (HCC) 03/15/2023   Buttock wound, left, initial encounter 11/02/2022   Orthostatic hypotension 08/13/2022   Neurogenic bowel 05/03/2022   Spasticity 05/03/2022   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/03/2022   Nerve pain 05/03/2022   Medication monitoring encounter 01/08/2022   Neurogenic bladder 10/11/2021   Urinary incontinence 10/11/2021   ESBL (extended spectrum  beta-lactamase) producing bacteria infection 10/09/2021   Recurrent UTI 10/09/2021   Quadriplegia, C5-C7 incomplete (HCC) 01/16/2021   History of spinal fracture 01/16/2021   Suprapubic catheter (HCC) 01/16/2021   Encounter for routine gynecological examination 09/28/2013   Onychomycosis 09/28/2013   Foot deformity, acquired 03/26/2012   Encounter for preventive health examination 12/25/2010   ROSACEA 08/25/2009   Disturbance in sleep behavior 03/11/2008   SKIN CANCER, HX OF 03/11/2008   DYSURIA, HX OF 03/11/2008   Hyperlipidemia 02/10/2007   CERVICALGIA 02/10/2007    ONSET DATE: 08/29/2023 (referral date)  REFERRING DIAG: G82.54 (ICD-10-CM) - Incomplete quadriplegia at C5-C8 level (HCC)  THERAPY DIAG:  Muscle weakness (generalized)  Other symptoms and signs involving the musculoskeletal system  Abnormal posture  Other lack of coordination  Other symptoms and signs involving the nervous system  Rationale for Evaluation and Treatment: Rehabilitation  SUBJECTIVE:  SUBJECTIVE STATEMENT: Pt reports no acute changes since last visit. Her arms are tired from her last session still.   From initial eval: Pt familiar to this clinic, last seen Oct-Nov 2024 but has been seen for multiple POCs since her initial injury in 2022. Pt returns to this clinic after being in [place]  for the past few months. While in [place]  in December 2024 patient had a fall where she slid forwards out of her wheelchair onto the floor, ended up fracturing both of her femurs. Pt was hospitalized for 10-11 days and had surgical repair of her femurs. Pt reports she has been cleared of all restrictions since surgery, does have ongoing swelling in both legs and worsened spasticity. Pt reports that the spasticity has slightly  improved since it initially started, was told by Dr. Lovorn the swelling may not resolve for 6-8 months (it has been 3 months) and not sure if spasticity will resolve but patient is hopeful that if her swelling improves her spasticity will improve as well.  Pt asking about other options to help manage the swelling her legs, has tried variable compression stockings (knee high) and her PTs in [place]  recommended tight shapewear. Pt reports that the knee-high compression stockings led to increased swelling in her knees and the shapewear did not help her swelling. Encouraged patient to get thigh-high compression stockings and elevate her LE in PWC. Pt has ordered an articulating bed that will get here next Monday (3/31) so she can better elevate her legs when in bed.  Pt is also concerned about decreased ROM in her legs, [name] works on stretching her legs frequently but she feels she has more motion in her LLE as compared to RLE and may have mild foot drop on her R side. Pt does have PRAFOs to wear at night. Pt has worked up to standing in her standing frame x 30-40 min at a time. Pt also worked on standing with her PT and in // bars in [place] . Pt also reports with her injuries she lost the ability to lock/unlock her R knee but that it is getting better. She reports she lost a lot of stamina during her hospital stay as well.  Pt also has a new wound since last seen in this clinic in her L gluteal fold, shearing injury. Pt was seeing wound care in [place]  and is scheduled to see wound care with [facility] early April (was not able to schedule with [facility] wound care until late April).  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 Yes: NPRS scale: 3-4 Pain location: elbows to fingertips on both arms Pain description: nerve pain Aggravating factors: not stated Relieving factors: not  stated  PRECAUTIONS: Fall and Other: osteoporosis  RED FLAGS: None   WEIGHT BEARING RESTRICTIONS: No  FALLS: Has patient fallen in last 6 months? Yes. Number of falls 1 fall in [place]  that resulted in B femur fractures  LIVING ENVIRONMENT: Lives with: lives with their spouse and and with full-time caregiver [name] Lives in: House/apartment Home is power wheelchair accessible Has following equipment at home: Wheelchair (power), Wheelchair (manual), Grab bars, Ramped entry, and standing frame, slide board  PLOF: Independent with household mobility with device, Independent with community mobility with device, Requires assistive device for independence, Needs assistance with ADLs, and Needs assistance with transfers  PATIENT GOALS: "still working on stand and  pivot with goal to pivot to commode or to a chair" "work on core-will help me with standing" "improve my stamina - being in the hospital I lost strength/endurance"   OBJECTIVE:  Note: Objective measures were completed at Evaluation unless otherwise noted.  DIAGNOSTIC FINDINGS: None update/relevant to this POC  COGNITION: Overall cognitive status: Within functional limits for tasks assessed   SENSATION: Decreased sensation in BUE and BLE secondary to incomplete quadriplegia Decreased sensation in proximal LLE as compared to distal LE  EDEMA:  Circumferential: R knee: 17"; L knee: 17.5" and Figure 8: R ankle 21", L ankle 21.5"  MUSCLE TONE: increased spasticity in BLE   POSTURE: rounded shoulders and forward head  LOWER EXTREMITY ROM:     Passive  Right Eval Left Eval  Hip flexion Tight hip flexors Tight hip flexors  Hip extension    Hip abduction    Hip adduction    Hip internal rotation    Hip external rotation    Knee flexion Tight HS Tight HS  Knee extension    Ankle dorsiflexion Decreased, tight gastroc Decreased, tight gastroc  Ankle plantarflexion    Ankle inversion    Ankle eversion     (Blank rows =  not tested)  LOWER EXTREMITY MMT:    MMT Right Eval Left Eval  Hip flexion 1 2-  Hip extension    Hip abduction    Hip adduction    Hip internal rotation    Hip external rotation    Knee flexion 0 3  Knee extension 2- 2-  Ankle dorsiflexion 2- 3  Ankle plantarflexion    Ankle inversion    Ankle eversion    (Blank rows = not tested)  BED MOBILITY:  From previous POC: Sit to supine Mod A Supine to sit Mod A Rolling to Right Mod A Rolling to Left Mod A Undulating mattress for wound management on standard bed (elevated-so often doing uphill sliding board transfers); she would like to continue working on sitting up independently, she has been working on rolling, needs less assistance w/ this when someone props her leg into hooklying; would like something to help her pull her left leg to her butt for stretching as well as bed mobility.  TRANSFERS: From previous POC: Pt continues using combination of bump over, slide board, and depression (squat pivot) transfers.                                                                                                                              TREATMENT:   TherAct // bars: Pt stands w/ BUE support on bars modA to stand, CGA-minA to maintain RLE engagement and for posterior hip support, caregiver timing stand duration.  Pt fatigued following last stand requesting stretching. Attempt 1:  47 sec Attempt 2: 12 sec Attempt 3: 30 sec  Slide board transfer PWC to mat table to left with min A.  Focus on stretching of BLE/BUE: Time spent rolling  minA-modA into prone.  PROM into quad stretch, hip flexor stretch, and gastroc stretch several reps for 15-20 seconds each bilaterally, some IR/ER PROM Pt rolls into supine w/ PT providing modA to lift upper trunk so pt can extend left arm for roll.  She rolls prone to supine SBA. Gentle PROM to all joints of BUE w/ light left abduction stretch (pt reports no pain) +2 transfer from supine to sitting  EOM due to far positioning on mat table and to avoid sheer - did have pt briefly attempt to bump forward in long-sitting, but due to finger flexion and clothing she was unable to complete task.  Slide board transfer back to [facility] to right with min A at end of session -PT provides posterior bump for improved positioning at end of transfer. Pt left seated in PWC and handed off to OT.    PATIENT EDUCATION: Education details: continue PROM at home Person educated: Patient and Caregiver [name] Education method: Explanation Education comprehension: verbalized understanding  HOME EXERCISE PROGRAM: Will be established as needed as pt has done continuous therapy and is working towards functional tasks.   GOALS: Goals reviewed with patient? Yes  SHORT TERM GOALS: Target date: 10/07/2023  HEP to be established for stretching and strengthening as appropriate. Baseline: not established at initial eval, reviewed during PT POC - pt and [name] report it is going well at home (4/25) Goal status: MET  2.  Pt will perform sit to stand transfer with LRAD with mod A Baseline: max A to stedy (4/4), max A in // bars (4/25) Goal status: IN PROGRESS  3.  Pt to tolerate standing x 5 min with LRAD to demonstrate improved endurance Baseline: 30 sec in stedy (initial), 2:35 min in // bars (4/25) Goal status: IN PROGRESS  4.  Pt to demonstrate reduced edema in BLE with a reduction in circumference/figure 8 measurement by 0.5" from initial evaluation. Baseline: Circumferential: R knee: 17"; L knee: 17.5" and Figure 8: R ankle 21", L ankle 21.5" 4/25: R knee 16", L knee: 16 11/16", R ankle: 21", L ankle 20" Goal status: IN PROGRESS   LONG TERM GOALS: Target date: 11/04/2023   Patient and her caregiver to be independent with performance of HEP for stretching and strengthening as appropriate. Baseline: not established at initial eval, reviewed during PT POC - pt and [name] report it is going well at home  (4/25) Goal status: INITIAL  2.  Pt will perform sit to stand transfer with LRAD with min A Baseline: max A to stedy (4/4), max A in // bars (4/25) Goal status: INITIAL  3.  Pt to tolerate standing x 5 min with LRAD to demonstrate improved endurance Baseline: 30 sec in stedy (initial), 2:35 min in // bars (4/25) Goal status: REVISED  4.  Pt to demonstrate reduced edema in BLE with a reduction in circumference/figure 8 measurement by 1" from initial evaluation. Baseline: Circumferential: R knee: 17"; L knee: 17.5" and Figure 8: R ankle 21", L ankle 21.5" 4/25: R knee 16", L knee: 16 11/16", R ankle: 21", L ankle 20" Goal status: INITIAL    ASSESSMENT:  CLINICAL IMPRESSION: Focused session on standing initially to improve tolerance.  Her longest bout of standing for 47 seconds today.  PT provided upper and lower body stretching due to reported tension from prior session.  She has great response to stretching.  Will continue per POC.    OBJECTIVE IMPAIRMENTS: decreased balance, decreased endurance, decreased mobility, difficulty walking, decreased ROM,  decreased strength, increased edema, impaired perceived functional ability, increased muscle spasms, impaired flexibility, impaired sensation, impaired tone, impaired UE functional use, postural dysfunction, and pain.   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   REHAB POTENTIAL: Good  CLINICAL DECISION MAKING: Stable/uncomplicated  EVALUATION COMPLEXITY:  High  PLAN:  PT FREQUENCY: 2x/week  PT DURATION: 8 weeks  PLANNED INTERVENTIONS: 97164- PT Re-evaluation, 97110-Therapeutic exercises, 97530- Therapeutic activity, 97112- Neuromuscular re-education, 97535- Self Care, 16109- Manual therapy, [phone]- Gait training, [phone]- Orthotic Fit/training, [phone]- Electrical stimulation (manual), Patient/Family education, Balance training, Stair training, Taping, Dry Needling, Joint mobilization, Scar mobilization, Compression bandaging, DME instructions, Wheelchair mobility training, Cryotherapy, and Moist heat  PLAN FOR NEXT SESSION: any questions over PROM/stretching? sit to stands, standing tolerance with stedy, in // bars, possibly with RW, core strengthening/stability, endurance; wants to have conversation about taking a break (3-6 months) after this POC near end of this POC, standing lateral weight shifts, mini-squats in standing?, work on bump transfers, assess cushions if patient brings them - TT to reach out to [place] to set this up, Yoga block press-ups vs push-up blocks  Bobbyjo Marulanda B Devlyn Parish, PT, DPT   10/12/2023, 12:35 PM

## 2023-10-12 NOTE — Therapy (Signed)
 OUTPATIENT OCCUPATIONAL THERAPY NEURO PROGRESS NOTE  Patient Name: [name] MRN: [id number] DOB:September 19, 1950, 73 y.o., female Today's Date: 10/12/2023  PCP: Reginal Capra, MD  REFERRING PROVIDER: Celia Coles, MD  END OF SESSION:  OT End of Session - 10/12/23 1106     Visit Number 10    Number of Visits 17    Date for OT Re-Evaluation 11/04/23    Authorization Type [employer] - auth approved    Authorization Time Period 09/09/2023 - 11/04/2023    OT Start Time 1104    OT Stop Time 1147    OT Time Calculation (min) 43 min    Activity Tolerance Patient tolerated treatment well    Behavior During Therapy [facility] for tasks assessed/performed             Past Medical History:  Diagnosis Date   CERVICAL POLYP 03/11/2008   Qualifier: Diagnosis of  By: Ethel Henry MD, Joaquim Muir    Colon polyps 2005   on colonscopy Dr. Sandrea Cruel   Fibroid 2004   Per Dr. Alva Jewels   History of shingles    face and mouth   Hx of skin cancer, basal cell    Rosacea    Sciatica of left side 09/28/2013   Scoliosis    noted on mri done for back pain   Past Surgical History:  Procedure Laterality Date   BUNIONECTOMY     Patient Active Problem List   Diagnosis Date Noted   Buttock wound, left, subsequent encounter 03/16/2023   Bronchiectasis with acute exacerbation (HCC) 03/15/2023   Buttock wound, left, initial encounter 11/02/2022   Orthostatic hypotension 08/13/2022   Neurogenic bowel 05/03/2022   Spasticity 05/03/2022   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/03/2022   Nerve pain 05/03/2022   Medication monitoring encounter 01/08/2022   Neurogenic bladder 10/11/2021   Urinary incontinence 10/11/2021   ESBL (extended spectrum beta-lactamase) producing bacteria infection 10/09/2021   Recurrent UTI 10/09/2021   Quadriplegia, C5-C7 incomplete (HCC) 01/16/2021   History of spinal fracture 01/16/2021   Suprapubic catheter (HCC) 01/16/2021   Encounter for routine gynecological examination 09/28/2013    Onychomycosis 09/28/2013   Foot deformity, acquired 03/26/2012   Encounter for preventive health examination 12/25/2010   ROSACEA 08/25/2009   Disturbance in sleep behavior 03/11/2008   SKIN CANCER, HX OF 03/11/2008   DYSURIA, HX OF 03/11/2008   Hyperlipidemia 02/10/2007   CERVICALGIA 02/10/2007    ONSET DATE: 07/28/2020  Date of Referral 08/29/2023   REFERRING DIAG: G82.54 (ICD-10-CM) - Quadriplegia, C5-C8, incomplete  THERAPY DIAG:  Muscle weakness (generalized)  Other symptoms and signs involving the musculoskeletal system  Other symptoms and signs involving the nervous system  Contracture of hand joint, left  Other lack of coordination  Contracture of hand joint, right  Rationale for Evaluation and Treatment: Rehabilitation  SUBJECTIVE:   SUBJECTIVE STATEMENT: Pt reports increasing concern for Botox  as she continues to feel she is losing strength.    Pt accompanied by:  Caregiver, [name]   PERTINENT HISTORY: "Pt is a 73 yr old L handed female with hx of incomplete quadriplegia- 2/14 2022- fleeing the police in [place] on passenger 100 (high speed) miles/hour,  [name]; neurogenic bowel and bladder and spasticity; [name], [name] Here for f/u on Incomplete quadriplegia"  B femur fractures December, 2024.  PRECAUTIONS: Fall; suprapubic catheter (she wants to get this removed meaning she needs to get to and from the toilet); she has had minor heat sensation when needing to  complete her bowel program-possible AD?   WEIGHT BEARING RESTRICTIONS: No  PAIN: - reports average pain as noted below. Will notify therapist if there are changes in her pain.  Are you having pain? Yes: NPRS scale: 3/10 Pain location: fingers to elbow bilaterally Pain description: constant Aggravating factors: it can increased over time ie) is worse at the end of the day.  Also cold affects cramps and function. Relieving factors: gabapentin  and baclofen  for spasms, nightly  stretching  FALLS: Has patient fallen in last 6 months? Yes. Number of falls 1  LIVING ENVIRONMENT: Lives with: lives with their family - husband [name] and with an adult companion s/p moving back up from [place]  x10 months Lives in: House/apartment Stairs:  4 story town house with an Engineer, structural with threshold adjustments, roll in shower with transport chair Has following equipment at home: Wheelchair (power) - with seat height adjustments to access counters and reclining option, Wheelchair (manual), transport WC, shower chair, and Ramped entry, handheld showerhead with rails around toilet, had Alen Amy but is no longer in need of it, has slide boards x3  PLOF: Requires assistive device for independence, Needs assistance with ADLs, Needs assistance with homemaking, Needs assistance with gait, and Needs assistance with transfers; 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  Used to like to knit, sew and bake.  PATIENT GOALS: improve spasticity and use of hands  OBJECTIVE:   HAND DOMINANCE: Left  ADLs: Overall ADLs: Patient has a live in caregiver  Transfers/ambulation related to ADLs: min assist with sliding board transfers.  Eating: Has a rocker knife that she can use. Used to use adapted utensils but now uses regular utensils but still will get assistance to cut food ie) when eating out.  Grooming: can brush her own hair with LUE only; unable to manage jewelry ie) earrings  UB Dressing: can zip/unzip after it has been started, unable to manage buttons herself, Caregiver assists but if she has extra time, she can put on her bra, and a loose fitting pullover shirt/t-shirt  LB Dressing: dependent for LB dressing in bed and with special sock donner for LE compression garments   Toileting: bladder trained with suprapubic catheter which she clamps off.  Dependent for bowel incontinence care.  Bathing: Sponge bath with adult washclothes.  Can bathe UB with back scrubber for most of  her back.  Needs help with feet (mentioned she might need a separate brush for feet)   Tub Shower transfers: Min assist with slide board to wheel in shower chair  Equipment: Shower seat with back, Walk in shower, bed side commode, Reacher, Sock aid, Long handled sponge, and Feeding equipment  IADLs: --  Shopping: Assisted by caregiver  Light housekeeping: Has housekeeper that comes monthly  Meal Prep: previously enjoyed baking. Assisted by caregiver but has reheated a meal for herself after getting food out of the fridge/freezer from her WC.  Community mobility: Dependent  Medication management: Caregiver sorts them into pillbox but she is very aware of her medications   Financial management: Patient manages her own finances  Handwriting: Increased time and has a pen with a little grip  MOBILITY STATUS: Independent with power mobility  ACTIVITY TOLERANCE: Activity tolerance: good to Fair - MMT WFL but has limited sustained tolerance for ongoing use of Ues with poor trunk control  FUNCTIONAL OUTCOME MEASURES:  PSFS: 3.3 total score  10/12/2023: 3.7 total score   Total score = sum of the activity scores/number of activities Minimum detectable  change (90%CI) for average score = 2 points Minimum detectable change (90%CI) for single activity score = 3 points   UPPER EXTREMITY ROM:   AROM - WFL without obvious contractures, some digital flexion noted but PROM WNL   UPPER EXTREMITY MMT:   Grossly WFL - Endurance limited R tricep strength > than L but L UE generally stronger than R UE  MMT Right (eval) Left (eval)  Shoulder flexion 4/5 4/5  Shoulder abduction 4/5 4/5  Elbow flexion 4/5 4/5  Elbow extension 4/5 4/5  (Blank rows = not tested)  HAND FUNCTION: Grip strength: Right: 4.4 lbs (decline) ; Left: 18 lbs (slight improvement)  COORDINATION: 09/29/23 s/p Botox  injections yesterday  Left: 56.13 sec Right 3:39.58 min  SENSATION: Light touch: Impaired  - patient    EDEMA: NA for UEs but LE has poor lymph drainage with custom compression garments   MUSCLE TONE: Generally WFL   COGNITION: Overall cognitive status: Within functional limits for tasks assessed  VISION: Subjective report: Patent wears progressive lens/glasses.  Denies diplopia or vision changes. Baseline vision: Wears glasses all the time  VISION ASSESSMENT: WFL  OBSERVATIONS: Patient independent with power WC navigation within clinic.  Patient is well-kept with foley catheter in place.  She has slight limitations in full extension of digits but PROM is WNL.    TODAY'S TREATMENT:                                                                                            - Neuro re-education completed for duration as noted below including: OT completed alternative ArcEx applications as noted in pt instructions. Pt's caregiver requiring increased time and cues from pt for proper application. While in use, pt completed B mini stress ball pinches at table to encourage improved fine motor coordination and functional use.  OT educated caregiver on how to switch between symmetrical and alternating settings for ArcEx and NMES with functional grasp and release respectively.  - Self-care/home management completed for duration as noted below including: OT addressed pt's voiced concerns about Botox . Therapist emphasizing that though weakness seems to have occurred, this does not mean it is permanent. It does mean the pt will need to discuss these concerns with her provider prior to any additional injections and she will need to continue to monitor her response to her most recent injections.    Therapist reviewed goals with patient and updated patient progression.   Objective measures assessed as noted in Goals section to determine progression towards goals. OT educated pt on jackets with magnetic closures to help pt be more independent with completion.   PATIENT EDUCATION: Education details:  NMES use; adaptive devices Person educated: Patient and Caregiver - Programmer, systems Education method: Explanation, Demonstration, and Verbal cues Education comprehension: verbalized understanding, returned demonstration, verbal cues required, and needs further education  HOME EXERCISE PROGRAM: Previously issued HEP per DC 12/02/22: All previous HEPs combined to 1 complete List through [employer] Access Code: [email or web address] 10/10/2023: Alternative ArcEx application guidance  GOALS:   SHORT TERM GOALS: Target date: 10/07/2023   1. Patient will verbalize understanding of AE/modified techniques to improve independence and  safety with ADL and IADL completion. Baseline: Caregiver/spouse assist Goal status: IN PROGRESS  2.  Pt will be independent with BUE braces/splints as needed to prevent contracture and improve functional use of hands.  Baseline: Caregiver/spouse assist Goal status: MET  LONG TERM GOALS: Target date: 11/04/2023  Patient will demonstrate independence with updated HEP for UE strengthening, coordination and ROM to prevent contractures and maintain strength for transfers and ADLs. Baseline: Previous HEPs have been established but need to be reviewed and updated.  Goal status: IN Progress  2.  Patient will report at least two-point increase in average PSFS score or at least three-point increase in a single activity score indicating functionally significant improvement given minimum detectable change. Baseline: 3.3 total score (See above for individual activity scores)  Goal status: IN Progress  3.  Patient will demonstrate at least 10 lbs R grip strength as needed to open jars and other containers. Baseline: 4.4 lbs 09/28/23: Botox  injections  10/12/2023: R - 1.7 lbs; L - 4.1, 7.4 lbs Goal status: IN PROGRESS  ASSESSMENT:  CLINICAL IMPRESSION: This 10 th progress note is for dates: 09/09/2023 to 10/12/2023. Pt has met 1/2 STGs and 0 LTGs. Pt is not making progress towards goals as expected,  which is believed to be due to reduction of functional spasticity in B hands. Pt is demonstrating better adherence with splint wear and better understanding of NMES use. She continues to benefit from skilled OT services in the outpatient setting to work towards remaining goals or until max rehab potential is met.   PERFORMANCE DEFICITS: in functional skills including ADLs, IADLs, coordination, dexterity, strength, muscle spasms, Fine motor control, Gross motor control, continence, skin integrity, and UE functional use,   IMPAIRMENTS: are limiting patient from ADLs, IADLs, work, and leisure.   CO-MORBIDITIES: has co-morbidities such as incontinence and wound  that affects occupational performance. Patient will benefit from skilled OT to address above impairments and improve overall function.  REHAB POTENTIAL: Fair due to chronicity of injury  PLAN:  OT FREQUENCY: 1-2x/week (pt agreeable to starting off at 2xweek then tapering with the idea that she will take a break from therapy following this therapy episode.)  OT DURATION: 8 weeks  PLANNED INTERVENTIONS: self care/ADL training, therapeutic exercise, therapeutic activity, neuromuscular re-education, manual therapy, passive range of motion, balance training, functional mobility training, splinting, patient/family education, energy conservation, coping strategies training, and DME and/or AE instructions  RECOMMENDED OTHER SERVICES: Patient was seen for PT evaluation today with treatment plans coordinated for 2x/week.  CONSULTED AND AGREED WITH PLAN OF CARE: Patient and family member/caregiver  PLAN FOR NEXT SESSION:  NMES instruction (review second unit reciprocal option)  Check splints PRN Review/progress HEPs Explore FM tasks and establish weight shifting instruction for pressure relief.   other ADL AE  Altamease Asters, OT 10/12/2023, 5:13 PM

## 2023-10-18 ENCOUNTER — Ambulatory Visit: Admitting: Occupational Therapy

## 2023-10-18 ENCOUNTER — Ambulatory Visit: Attending: Physical Medicine and Rehabilitation | Admitting: Physical Therapy

## 2023-10-18 ENCOUNTER — Other Ambulatory Visit (INDEPENDENT_AMBULATORY_CARE_PROVIDER_SITE_OTHER)

## 2023-10-18 ENCOUNTER — Encounter: Payer: Self-pay | Admitting: Physical Therapy

## 2023-10-18 DIAGNOSIS — M24541 Contracture, right hand: Secondary | ICD-10-CM

## 2023-10-18 DIAGNOSIS — R293 Abnormal posture: Secondary | ICD-10-CM | POA: Diagnosis not present

## 2023-10-18 DIAGNOSIS — G8253 Quadriplegia, C5-C7 complete: Secondary | ICD-10-CM | POA: Insufficient documentation

## 2023-10-18 DIAGNOSIS — M6281 Muscle weakness (generalized): Secondary | ICD-10-CM

## 2023-10-18 DIAGNOSIS — R29818 Other symptoms and signs involving the nervous system: Secondary | ICD-10-CM

## 2023-10-18 DIAGNOSIS — R2689 Other abnormalities of gait and mobility: Secondary | ICD-10-CM | POA: Diagnosis not present

## 2023-10-18 DIAGNOSIS — M25641 Stiffness of right hand, not elsewhere classified: Secondary | ICD-10-CM | POA: Diagnosis not present

## 2023-10-18 DIAGNOSIS — M24542 Contracture, left hand: Secondary | ICD-10-CM | POA: Diagnosis not present

## 2023-10-18 DIAGNOSIS — R7989 Other specified abnormal findings of blood chemistry: Secondary | ICD-10-CM | POA: Diagnosis not present

## 2023-10-18 DIAGNOSIS — R208 Other disturbances of skin sensation: Secondary | ICD-10-CM | POA: Diagnosis not present

## 2023-10-18 DIAGNOSIS — R29898 Other symptoms and signs involving the musculoskeletal system: Secondary | ICD-10-CM | POA: Diagnosis not present

## 2023-10-18 DIAGNOSIS — R278 Other lack of coordination: Secondary | ICD-10-CM | POA: Diagnosis not present

## 2023-10-18 LAB — IBC + FERRITIN
Ferritin: 323.6 ng/mL — ABNORMAL HIGH (ref 10.0–291.0)
Iron: 69 ug/dL (ref 42–145)
Saturation Ratios: 26.2 % (ref 20.0–50.0)
TIBC: 263.2 ug/dL (ref 250.0–450.0)
Transferrin: 188 mg/dL — ABNORMAL LOW (ref 212.0–360.0)

## 2023-10-18 LAB — HEPATIC FUNCTION PANEL
ALT: 30 U/L (ref 0–35)
AST: 28 U/L (ref 0–37)
Albumin: 3.9 g/dL (ref 3.5–5.2)
Alkaline Phosphatase: 104 U/L (ref 39–117)
Bilirubin, Direct: 0.1 mg/dL (ref 0.0–0.3)
Total Bilirubin: 0.4 mg/dL (ref 0.2–1.2)
Total Protein: 7 g/dL (ref 6.0–8.3)

## 2023-10-18 NOTE — Therapy (Signed)
 OUTPATIENT PHYSICAL THERAPY NEURO TREATMENT Patient Name: [name] MRN: [id number] DOB:November 27, 1950, 73 y.o., female Today's Date: 10/18/2023   PCP: Reginal Capra, MD REFERRING PROVIDER: Celia Coles, MD  END OF SESSION:  PT End of Session - 10/18/23 1405     Visit Number 11    Number of Visits 17   with eval   Date for PT Re-Evaluation 11/18/23   to allow for scheduling delays   Authorization Type [employer] MEDICARE    PT Start Time 1402    PT Stop Time 1453    PT Time Calculation (min) 51 min    Equipment Utilized During Treatment Gait belt    Activity Tolerance Patient tolerated treatment well    Behavior During Therapy WFL for tasks assessed/performed                    Past Medical History:  Diagnosis Date   CERVICAL POLYP 03/11/2008   Qualifier: Diagnosis of  By: Ethel Henry MD, Joaquim Muir    Colon polyps 2005   on colonscopy Dr. Sandrea Cruel   Fibroid 2004   Per Dr. Alva Jewels   History of shingles    face and mouth   Hx of skin cancer, basal cell    Rosacea    Sciatica of left side 09/28/2013   Scoliosis    noted on mri done for back pain   Past Surgical History:  Procedure Laterality Date   BUNIONECTOMY     Patient Active Problem List   Diagnosis Date Noted   Buttock wound, left, subsequent encounter 03/16/2023   Bronchiectasis with acute exacerbation (HCC) 03/15/2023   Buttock wound, left, initial encounter 11/02/2022   Orthostatic hypotension 08/13/2022   Neurogenic bowel 05/03/2022   Spasticity 05/03/2022   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/03/2022   Nerve pain 05/03/2022   Medication monitoring encounter 01/08/2022   Neurogenic bladder 10/11/2021   Urinary incontinence 10/11/2021   ESBL (extended spectrum beta-lactamase) producing bacteria infection 10/09/2021   Recurrent UTI 10/09/2021   Quadriplegia, C5-C7 incomplete (HCC) 01/16/2021   History of spinal fracture 01/16/2021   Suprapubic catheter (HCC) 01/16/2021   Encounter for routine gynecological  examination 09/28/2013   Onychomycosis 09/28/2013   Foot deformity, acquired 03/26/2012   Encounter for preventive health examination 12/25/2010   ROSACEA 08/25/2009   Disturbance in sleep behavior 03/11/2008   SKIN CANCER, HX OF 03/11/2008   DYSURIA, HX OF 03/11/2008   Hyperlipidemia 02/10/2007   CERVICALGIA 02/10/2007    ONSET DATE: 08/29/2023 (referral date)  REFERRING DIAG: G82.54 (ICD-10-CM) - Incomplete quadriplegia at C5-C8 level (HCC)  THERAPY DIAG:  Muscle weakness (generalized)  Other symptoms and signs involving the musculoskeletal system  Abnormal posture  Other lack of coordination  Other symptoms and signs involving the nervous system  Rationale for Evaluation and Treatment: Rehabilitation  SUBJECTIVE:  SUBJECTIVE STATEMENT: Pt reports no acute changes since last visit.  She would like to try standing first today.   From initial eval: Pt familiar to this clinic, last seen Oct-Nov 2024 but has been seen for multiple POCs since her initial injury in 2022. Pt returns to this clinic after being in [place]  for the past few months. While in [place]  in December 2024 patient had a fall where she slid forwards out of her wheelchair onto the floor, ended up fracturing both of her femurs. Pt was hospitalized for 10-11 days and had surgical repair of her femurs. Pt reports she has been cleared of all restrictions since surgery, does have ongoing swelling in both legs and worsened spasticity. Pt reports that the spasticity has slightly improved since it initially started, was told by Dr. Lovorn the swelling may not resolve for 6-8 months (it has been 3 months) and not sure if spasticity will resolve but patient is hopeful that if her swelling improves her spasticity will improve as well.  Pt  asking about other options to help manage the swelling her legs, has tried variable compression stockings (knee high) and her PTs in [place]  recommended tight shapewear. Pt reports that the knee-high compression stockings led to increased swelling in her knees and the shapewear did not help her swelling. Encouraged patient to get thigh-high compression stockings and elevate her LE in PWC. Pt has ordered an articulating bed that will get here next Monday (3/31) so she can better elevate her legs when in bed.  Pt is also concerned about decreased ROM in her legs, [name] works on stretching her legs frequently but she feels she has more motion in her LLE as compared to RLE and may have mild foot drop on her R side. Pt does have PRAFOs to wear at night. Pt has worked up to standing in her standing frame x 30-40 min at a time. Pt also worked on standing with her PT and in // bars in [place] . Pt also reports with her injuries she lost the ability to lock/unlock her R knee but that it is getting better. She reports she lost a lot of stamina during her hospital stay as well.  Pt also has a new wound since last seen in this clinic in her L gluteal fold, shearing injury. Pt was seeing wound care in [place]  and is scheduled to see wound care with [facility] early April (was not able to schedule with [facility] wound care until late April).  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 Yes: NPRS scale: 3-4 Pain location: elbows to fingertips on both arms Pain description: nerve pain Aggravating factors: not stated Relieving factors: not stated  PRECAUTIONS: Fall and Other: osteoporosis  RED FLAGS: None   WEIGHT BEARING RESTRICTIONS: No  FALLS: Has patient fallen in last 6 months? Yes. Number of falls 1 fall in [place]  that resulted in B femur fractures  LIVING ENVIRONMENT: Lives with:  lives with their spouse and and with full-time caregiver [name] Lives in: House/apartment Home is power wheelchair accessible Has following equipment at home: Wheelchair (power), Wheelchair (manual), Grab bars, Ramped entry, and standing frame, slide board  PLOF: Independent with household mobility with device, Independent with community mobility with device, Requires assistive device for independence, Needs assistance with ADLs, and Needs assistance with transfers  PATIENT GOALS: "still working on stand and  pivot with goal to pivot to commode or to a chair" "work on core-will help me with standing" "improve my stamina - being in the hospital I lost strength/endurance"   OBJECTIVE:  Note: Objective measures were completed at Evaluation unless otherwise noted.  DIAGNOSTIC FINDINGS: None update/relevant to this POC  COGNITION: Overall cognitive status: Within functional limits for tasks assessed   SENSATION: Decreased sensation in BUE and BLE secondary to incomplete quadriplegia Decreased sensation in proximal LLE as compared to distal LE  EDEMA:  Circumferential: R knee: 17"; L knee: 17.5" and Figure 8: R ankle 21", L ankle 21.5"  MUSCLE TONE: increased spasticity in BLE   POSTURE: rounded shoulders and forward head  LOWER EXTREMITY ROM:     Passive  Right Eval Left Eval  Hip flexion Tight hip flexors Tight hip flexors  Hip extension    Hip abduction    Hip adduction    Hip internal rotation    Hip external rotation    Knee flexion Tight HS Tight HS  Knee extension    Ankle dorsiflexion Decreased, tight gastroc Decreased, tight gastroc  Ankle plantarflexion    Ankle inversion    Ankle eversion     (Blank rows = not tested)  LOWER EXTREMITY MMT:    MMT Right Eval Left Eval  Hip flexion 1 2-  Hip extension    Hip abduction    Hip adduction    Hip internal rotation    Hip external rotation    Knee flexion 0 3  Knee extension 2- 2-  Ankle dorsiflexion 2- 3   Ankle plantarflexion    Ankle inversion    Ankle eversion    (Blank rows = not tested)  BED MOBILITY:  From previous POC: Sit to supine Mod A Supine to sit Mod A Rolling to Right Mod A Rolling to Left Mod A Undulating mattress for wound management on standard bed (elevated-so often doing uphill sliding board transfers); she would like to continue working on sitting up independently, she has been working on rolling, needs less assistance w/ this when someone props her leg into hooklying; would like something to help her pull her left leg to her butt for stretching as well as bed mobility.  TRANSFERS: From previous POC: Pt continues using combination of bump over, slide board, and depression (squat pivot) transfers.                                                                                                                              TREATMENT:   [employer] // bars: Pt stands w/ BUE support on bars minA to stand using left dycem grip to enhance independence due to recent botox -related hand weakness, CGA-minA to maintain RLE engagement, weight shift laterally using mirror feedback to improve alignment and for posterior hip support when adjusting UE placement.  Caregiver timing stand duration.  Pt fatigued following last stand requesting stretching.  Prolonged seated rest following each stand. Attempt  1:  1 minute 48 seconds Attempt 2: 2 minutes 45 seconds  Right bump transfer PWC to mat table to left with min A and several attempts to obtain adequate lift.  Focus on stretching of BLE/BUE: Assisted mod-maxA in supine w/ positioning of hips, LE, and upper body for improved alignment. PROM into hip and knee flexion > DF stretch > gentle IR/ER > gentle adductor stretch bilaterally several reps of varying length w/ pt giving feedback to prevent overstretching.  Downhill slide board transfer back to PWC to left with min A at end of session.  Pt left seated in [employer] w/ caregiver.  PATIENT  EDUCATION: Education details: Continue PROM at home. Brief discussion of pausing vs continuing therapy as pt would like to continue her PT POC w/ OT POC during 3 month duration of botox  trial - discussed need to reassess progress towards goals and PT will remain open to idea of continuing vs previously discussed break at end of current POC as pt has several visits left in this POC.  Discussed POC moving forward to further address recent regression in proximal body strength and pt personal goals of working on long-sitting for stretch/core strength and bumping on slide board to add to her skill set in the event she cannot stand pivot in the future. Person educated: Patient and Arts administrator Education method: Explanation Education comprehension: verbalized understanding  HOME EXERCISE PROGRAM: Will be established as needed as pt has done continuous therapy and is working towards functional tasks.   GOALS: Goals reviewed with patient? Yes  SHORT TERM GOALS: Target date: 10/07/2023  HEP to be established for stretching and strengthening as appropriate. Baseline: not established at initial eval, reviewed during PT POC - pt and [name] report it is going well at home (4/25) Goal status: MET  2.  Pt will perform sit to stand transfer with LRAD with mod A Baseline: max A to stedy (4/4), max A in // bars (4/25) Goal status: IN PROGRESS  3.  Pt to tolerate standing x 5 min with LRAD to demonstrate improved endurance Baseline: 30 sec in stedy (initial), 2:35 min in // bars (4/25) Goal status: IN PROGRESS  4.  Pt to demonstrate reduced edema in BLE with a reduction in circumference/figure 8 measurement by 0.5" from initial evaluation. Baseline: Circumferential: R knee: 17"; L knee: 17.5" and Figure 8: R ankle 21", L ankle 21.5" 4/25: R knee 16", L knee: 16 11/16", R ankle: 21", L ankle 20" Goal status: IN PROGRESS   LONG TERM GOALS: Target date: 11/04/2023   Patient and her caregiver to be  independent with performance of HEP for stretching and strengthening as appropriate. Baseline: not established at initial eval, reviewed during PT POC - pt and [name] report it is going well at home (4/25) Goal status: INITIAL  2.  Pt will perform sit to stand transfer with LRAD with min A Baseline: max A to stedy (4/4), max A in // bars (4/25) Goal status: INITIAL  3.  Pt to tolerate standing x 5 min with LRAD to demonstrate improved endurance Baseline: 30 sec in stedy (initial), 2:35 min in // bars (4/25) Goal status: REVISED  4.  Pt to demonstrate reduced edema in BLE with a reduction in circumference/figure 8 measurement by 1" from initial evaluation. Baseline: Circumferential: R knee: 17"; L knee: 17.5" and Figure 8: R ankle 21", L ankle 21.5" 4/25: R knee 16", L knee: 16 11/16", R ankle: 21", L ankle 20" Goal status: INITIAL  ASSESSMENT:  CLINICAL IMPRESSION: Pt demonstrates improved standing tolerance and core control in standing this visit.  Longest stand today was 2 minutes and 45 seconds with minA vs needed modA last session for shorter stand times (<1 minute each).  In future visits she would like to address long-sitting to meet various functional core demands and stretching needs.  She also would like to work on bumping utilizing the slide board in the event she cannot reach her stand pivot goal.  PT to continue POC w/ determination of further services made towards end of current POC.  OBJECTIVE IMPAIRMENTS: decreased balance, decreased endurance, decreased mobility, difficulty walking, decreased ROM, decreased strength, increased edema, impaired perceived functional ability, increased muscle spasms, impaired flexibility, impaired sensation, impaired tone, impaired UE functional use, postural dysfunction, and pain.   ACTIVITY LIMITATIONS: carrying, lifting, bending, standing, stairs, transfers, bed mobility, continence, bathing, toileting, dressing, reach over head, and  hygiene/grooming  PARTICIPATION LIMITATIONS: meal prep, cleaning, laundry, driving, shopping, community activity, and occupation  PERSONAL FACTORS: Age, Sex, Time since onset of injury/illness/exacerbation, and 1-2 comorbidities:    C7 ASIA C- incomplete quad w/ neurogenic bladder and bowel, HLD, Hx of skin cancerare also affecting patient's functional outcome.   REHAB POTENTIAL: Good  CLINICAL DECISION MAKING: Stable/uncomplicated  EVALUATION COMPLEXITY: High  PLAN:  PT FREQUENCY: 2x/week  PT DURATION: 8 weeks  PLANNED INTERVENTIONS: 97164- PT Re-evaluation, 97110-Therapeutic exercises, 97530- Therapeutic activity, 97112- Neuromuscular re-education, 97535- Self Care, 16109- Manual therapy, [phone]- Gait training, [phone]- Orthotic Fit/training, [phone]- Electrical stimulation (manual), Patient/Family education, Balance training, Stair training, Taping, Dry Needling, Joint mobilization, Scar mobilization, Compression bandaging, DME instructions, Wheelchair mobility training, Cryotherapy, and Moist heat  PLAN FOR NEXT SESSION: any questions over PROM/stretching? sit to stands, standing tolerance with stedy, in // bars, possibly with RW, core strengthening/stability, endurance; wants to have conversation about taking a break (3-6 months) after this POC near end of this POC, standing lateral weight shifts, mini-squats in standing?, work on bump transfers, assess cushions if patient brings them - TT to reach out to [place] to set this up, Yoga block press-ups vs push-up blocks, long-sitting, bumping on slide board  Earlean Glaze, PT, DPT   10/18/2023, 11:17 PM

## 2023-10-18 NOTE — Therapy (Unsigned)
 OUTPATIENT OCCUPATIONAL THERAPY NEURO TREATMENT NOTE  Patient Name: [name] MRN: [id number] DOB:06-13-1951, 73 y.o., female Today's Date: 10/18/2023  PCP: Reginal Capra, MD  REFERRING PROVIDER: Celia Coles, MD  END OF SESSION:  OT End of Session - 10/18/23 1320     Visit Number 11    Number of Visits 17    Date for OT Re-Evaluation 11/04/23    Authorization Type [employer] - auth approved    Authorization Time Period 09/09/2023 - 11/04/2023    OT Start Time 1320    OT Stop Time 1400    OT Time Calculation (min) 40 min    Equipment Utilized During Treatment Personalized TENS unit, dice game    Activity Tolerance Patient tolerated treatment well    Behavior During Therapy WFL for tasks assessed/performed             Past Medical History:  Diagnosis Date   CERVICAL POLYP 03/11/2008   Qualifier: Diagnosis of  By: Ethel Henry MD, Joaquim Muir    Colon polyps 2005   on colonscopy Dr. Sandrea Cruel   Fibroid 2004   Per Dr. Alva Jewels   History of shingles    face and mouth   Hx of skin cancer, basal cell    Rosacea    Sciatica of left side 09/28/2013   Scoliosis    noted on mri done for back pain   Past Surgical History:  Procedure Laterality Date   BUNIONECTOMY     Patient Active Problem List   Diagnosis Date Noted   Buttock wound, left, subsequent encounter 03/16/2023   Bronchiectasis with acute exacerbation (HCC) 03/15/2023   Buttock wound, left, initial encounter 11/02/2022   Orthostatic hypotension 08/13/2022   Neurogenic bowel 05/03/2022   Spasticity 05/03/2022   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/03/2022   Nerve pain 05/03/2022   Medication monitoring encounter 01/08/2022   Neurogenic bladder 10/11/2021   Urinary incontinence 10/11/2021   ESBL (extended spectrum beta-lactamase) producing bacteria infection 10/09/2021   Recurrent UTI 10/09/2021   Quadriplegia, C5-C7 incomplete (HCC) 01/16/2021   History of spinal fracture 01/16/2021   Suprapubic catheter (HCC)  01/16/2021   Encounter for routine gynecological examination 09/28/2013   Onychomycosis 09/28/2013   Foot deformity, acquired 03/26/2012   Encounter for preventive health examination 12/25/2010   ROSACEA 08/25/2009   Disturbance in sleep behavior 03/11/2008   SKIN CANCER, HX OF 03/11/2008   DYSURIA, HX OF 03/11/2008   Hyperlipidemia 02/10/2007   CERVICALGIA 02/10/2007    ONSET DATE: 07/28/2020  Date of Referral 08/29/2023   REFERRING DIAG: G82.54 (ICD-10-CM) - Quadriplegia, C5-C8, incomplete  THERAPY DIAG:  Muscle weakness (generalized)  Other lack of coordination  Contracture of hand joint, left  Contracture of hand joint, right  Other symptoms and signs involving the nervous system  Rationale for Evaluation and Treatment: Rehabilitation  SUBJECTIVE:   SUBJECTIVE STATEMENT: Pt reports varied results with Botox  injections as as feels she has lost some strength but yet she is able to move some fingers better ie) can lift R pinkie finger on her own now.   Pt brought her transcutaneous spinal stimulator and was ready for application.   Pt accompanied by:  Caregiver, [name]   PERTINENT HISTORY: "Pt is a 73 yr old L handed female with hx of incomplete quadriplegia- 2/14 2022- fleeing the police in [place] on passenger 100 (high speed) miles/hour,  [name]; neurogenic bowel and bladder and spasticity; [name], [name] Here for f/u on Incomplete quadriplegia"  B femur fractures December, 2024.  PRECAUTIONS: Fall; suprapubic catheter (she wants to get this removed meaning she needs to get to and from the toilet); she has had minor heat sensation when needing to complete her bowel program-possible AD?   WEIGHT BEARING RESTRICTIONS: No  PAIN: - reports average pain as noted below. Will notify therapist if there are changes in her pain.  Are you having pain? Yes: NPRS scale: 3/10 Pain location: fingers to elbow bilaterally Pain description:  constant Aggravating factors: it can increased over time ie) is worse at the end of the day.  Also cold affects cramps and function. Relieving factors: gabapentin  and baclofen  for spasms, nightly stretching  FALLS: Has patient fallen in last 6 months? Yes. Number of falls 1  LIVING ENVIRONMENT: Lives with: lives with their family - husband [name] and with an adult companion s/p moving back up from [place]  x10 months Lives in: House/apartment Stairs:  4 story town house with an Engineer, structural with threshold adjustments, roll in shower with transport chair Has following equipment at home: Wheelchair (power) - with seat height adjustments to access counters and reclining option, Wheelchair (manual), transport WC, shower chair, and Ramped entry, handheld showerhead with rails around toilet, had Alen Amy but is no longer in need of it, has slide boards x3  PLOF: Requires assistive device for independence, Needs assistance with ADLs, Needs assistance with homemaking, Needs assistance with gait, and Needs assistance with transfers; 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  Used to like to knit, sew and bake.  PATIENT GOALS: improve spasticity and use of hands  OBJECTIVE:   HAND DOMINANCE: Left  ADLs: Overall ADLs: Patient has a live in caregiver  Transfers/ambulation related to ADLs: min assist with sliding board transfers.  Eating: Has a rocker knife that she can use. Used to use adapted utensils but now uses regular utensils but still will get assistance to cut food ie) when eating out.  Grooming: can brush her own hair with LUE only; unable to manage jewelry ie) earrings  UB Dressing: can zip/unzip after it has been started, unable to manage buttons herself, Caregiver assists but if she has extra time, she can put on her bra, and a loose fitting pullover shirt/t-shirt  LB Dressing: dependent for LB dressing in bed and with special sock donner for LE compression garments   Toileting:  bladder trained with suprapubic catheter which she clamps off.  Dependent for bowel incontinence care.  Bathing: Sponge bath with adult washclothes.  Can bathe UB with back scrubber for most of her back.  Needs help with feet (mentioned she might need a separate brush for feet)   Tub Shower transfers: Min assist with slide board to wheel in shower chair  Equipment: Shower seat with back, Walk in shower, bed side commode, Reacher, Sock aid, Long handled sponge, and Feeding equipment  IADLs: --  Shopping: Assisted by caregiver  Light housekeeping: Has housekeeper that comes monthly  Meal Prep: previously enjoyed baking. Assisted by caregiver but has reheated a meal for herself after getting food out of the fridge/freezer from her WC.  Community mobility: Dependent  Medication management: Caregiver sorts them into pillbox but she is very aware of her medications   Financial management: Patient manages her own finances  Handwriting: Increased time and has a pen with a little grip  MOBILITY STATUS: Independent with power mobility  ACTIVITY TOLERANCE: Activity tolerance: good to Fair - MMT WFL but has limited sustained tolerance for ongoing  use of Ues with poor trunk control  FUNCTIONAL OUTCOME MEASURES:  PSFS: 3.3 total score  10/12/2023: 3.7 total score   Total score = sum of the activity scores/number of activities Minimum detectable change (90%CI) for average score = 2 points Minimum detectable change (90%CI) for single activity score = 3 points   UPPER EXTREMITY ROM:   AROM - WFL without obvious contractures, some digital flexion noted but PROM WNL   UPPER EXTREMITY MMT:   Grossly WFL - Endurance limited R tricep strength > than L but L UE generally stronger than R UE  MMT Right (eval) Left (eval)  Shoulder flexion 4/5 4/5  Shoulder abduction 4/5 4/5  Elbow flexion 4/5 4/5  Elbow extension 4/5 4/5  (Blank rows = not tested)  HAND FUNCTION: Grip strength: Right: 4.4  lbs (decline) ; Left: 18 lbs (slight improvement)  COORDINATION: 09/29/23 s/p Botox  injections yesterday  Left: 56.13 sec Right 3:39.58 min  SENSATION: Light touch: Impaired  - patient   EDEMA: NA for UEs but LE has poor lymph drainage with custom compression garments   MUSCLE TONE: Generally WFL   COGNITION: Overall cognitive status: Within functional limits for tasks assessed  VISION: Subjective report: Patent wears progressive lens/glasses.  Denies diplopia or vision changes. Baseline vision: Wears glasses all the time  VISION ASSESSMENT: WFL  OBSERVATIONS: Patient independent with power WC navigation within clinic.  Patient is well-kept with foley catheter in place.  She has slight limitations in full extension of digits but PROM is WNL.    TODAY'S TREATMENT:                                                                                            - Therapeutic Activities Pt's caregiver applied transcutaneous spinal stimulator for duration of fine motor activities. Utilized Trash Dice game to work on picking up individual small objects/dice (x40). The object of the game is to keep the most dice and not to have to discard your dice into the trash can during turn taking.  Pt engaged in picking up dice 60+ times each game x 3 games today. Tasks included  -sorting dice by color (20 to each person) -practicing picking up 1 dice with each hand and trying to minimize shoulder compensation  -picking up and rolling dice individually with practice supinating forearm to hold dice in palm before dropping it -placing dice in containers - larger opening of can or smaller square for dice in lid with some difficulty with lining them up with small spaces in lid at least 25% of the time -turning over trash can lid to keep the 6 dice placed there through turn taking  PATIENT EDUCATION: Education details: UE digital ROM activities Person educated: Patient and Caregiver - Programmer, systems Education  method: Explanation, Demonstration, and Verbal cues Education comprehension: verbalized understanding, returned demonstration, verbal cues required, and needs further education  HOME EXERCISE PROGRAM: Previously issued HEP per DC 12/02/22: All previous HEPs combined to 1 complete List through [employer] Access Code: [email or web address] 10/10/2023: Alternative [employer] application guidance  GOALS:   SHORT TERM GOALS: Target date: 10/07/2023   1. Patient will  verbalize understanding of AE/modified techniques to improve independence and safety with ADL and IADL completion. Baseline: Caregiver/spouse assist Goal status: IN PROGRESS  2.  Pt will be independent with BUE braces/splints as needed to prevent contracture and improve functional use of hands.  Baseline: Caregiver/spouse assist Goal status: MET  LONG TERM GOALS: Target date: 11/04/2023  Patient will demonstrate independence with updated HEP for UE strengthening, coordination and ROM to prevent contractures and maintain strength for transfers and ADLs. Baseline: Previous HEPs have been established but need to be reviewed and updated.  Goal status: IN Progress  2.  Patient will report at least two-point increase in average PSFS score or at least three-point increase in a single activity score indicating functionally significant improvement given minimum detectable change. Baseline: 3.3 total score (See above for individual activity scores)  Goal status: IN Progress  3.  Patient will demonstrate at least 10 lbs R grip strength as needed to open jars and other containers. Baseline: 4.4 lbs 09/28/23: Botox  injections  10/12/2023: R - 1.7 lbs; L - 4.1, 7.4 lbs Goal status: IN PROGRESS  ASSESSMENT:  CLINICAL IMPRESSION: Patient is a 73 y.o. female who was seen today for occupational therapy evaluation for UE dysfunction s/p SCI. Pt is still adjusting to changes due to Botox  injections and per progress note last week has not made progress towards goals  as expected, which is believed to be due to reduction of functional spasticity in B hands. Pt is demonstrating better adherence with splint wear and better understanding of NMES use. She continues to benefit from skilled OT services in the outpatient setting to work towards remaining goals or until max rehab potential is met.   PERFORMANCE DEFICITS: in functional skills including ADLs, IADLs, coordination, dexterity, strength, muscle spasms, Fine motor control, Gross motor control, continence, skin integrity, and UE functional use,   IMPAIRMENTS: are limiting patient from ADLs, IADLs, work, and leisure.   CO-MORBIDITIES: has co-morbidities such as incontinence and wound  that affects occupational performance. Patient will benefit from skilled OT to address above impairments and improve overall function.  REHAB POTENTIAL: Fair due to chronicity of injury  PLAN:  OT FREQUENCY: 1-2x/week (pt agreeable to starting off at 2xweek then tapering with the idea that she will take a break from therapy following this therapy episode.)  OT DURATION: 8 weeks  PLANNED INTERVENTIONS: self care/ADL training, therapeutic exercise, therapeutic activity, neuromuscular re-education, manual therapy, passive range of motion, balance training, functional mobility training, splinting, patient/family education, energy conservation, coping strategies training, and DME and/or AE instructions  RECOMMENDED OTHER SERVICES: Patient was seen for PT evaluation today with treatment plans coordinated for 2x/week.  CONSULTED AND AGREED WITH PLAN OF CARE: Patient and family member/caregiver  PLAN FOR NEXT SESSION:  NMES instruction (review second unit reciprocal option)  Check splints PRN Review/progress HEPs Explore FM tasks and establish weight shifting instruction for pressure relief.   other ADL AE  Zora Hires, OT 06/19/1094, 5:01 PM

## 2023-10-19 ENCOUNTER — Other Ambulatory Visit

## 2023-10-19 ENCOUNTER — Encounter (HOSPITAL_BASED_OUTPATIENT_CLINIC_OR_DEPARTMENT_OTHER): Attending: Internal Medicine | Admitting: Internal Medicine

## 2023-10-19 DIAGNOSIS — R338 Other retention of urine: Secondary | ICD-10-CM | POA: Diagnosis not present

## 2023-10-19 DIAGNOSIS — S31829A Unspecified open wound of left buttock, initial encounter: Secondary | ICD-10-CM | POA: Diagnosis not present

## 2023-10-19 DIAGNOSIS — S30810A Abrasion of lower back and pelvis, initial encounter: Secondary | ICD-10-CM | POA: Diagnosis not present

## 2023-10-19 DIAGNOSIS — G8254 Quadriplegia, C5-C7 incomplete: Secondary | ICD-10-CM | POA: Diagnosis not present

## 2023-10-19 DIAGNOSIS — T798XXA Other early complications of trauma, initial encounter: Secondary | ICD-10-CM

## 2023-10-19 DIAGNOSIS — N319 Neuromuscular dysfunction of bladder, unspecified: Secondary | ICD-10-CM | POA: Diagnosis not present

## 2023-10-20 NOTE — Therapy (Signed)
 OUTPATIENT OCCUPATIONAL THERAPY NEURO TREATMENT NOTE  Patient Name: [name] MRN: [id number] DOB:01/04/1951, 73 y.o., female Today's Date: 10/21/2023  PCP: Reginal Capra, MD  REFERRING PROVIDER: Celia Coles, MD  END OF SESSION:  OT End of Session - 10/21/23 1016     Visit Number 12    Number of Visits 17    Date for OT Re-Evaluation 11/04/23    Authorization Type [employer] - auth approved    Authorization Time Period 09/09/2023 - 11/04/2023    OT Start Time 1104    OT Stop Time 1146    OT Time Calculation (min) 42 min    Activity Tolerance Patient tolerated treatment well    Behavior During Therapy [facility] for tasks assessed/performed            Past Medical History:  Diagnosis Date   CERVICAL POLYP 03/11/2008   Qualifier: Diagnosis of  By: Ethel Henry MD, Joaquim Muir    Colon polyps 2005   on colonscopy Dr. Sandrea Cruel   Fibroid 2004   Per Dr. Alva Jewels   History of shingles    face and mouth   Hx of skin cancer, basal cell    Rosacea    Sciatica of left side 09/28/2013   Scoliosis    noted on mri done for back pain   Past Surgical History:  Procedure Laterality Date   BUNIONECTOMY     Patient Active Problem List   Diagnosis Date Noted   Buttock wound, left, subsequent encounter 03/16/2023   Bronchiectasis with acute exacerbation (HCC) 03/15/2023   Buttock wound, left, initial encounter 11/02/2022   Orthostatic hypotension 08/13/2022   Neurogenic bowel 05/03/2022   Spasticity 05/03/2022   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/03/2022   Nerve pain 05/03/2022   Medication monitoring encounter 01/08/2022   Neurogenic bladder 10/11/2021   Urinary incontinence 10/11/2021   ESBL (extended spectrum beta-lactamase) producing bacteria infection 10/09/2021   Recurrent UTI 10/09/2021   Quadriplegia, C5-C7 incomplete (HCC) 01/16/2021   History of spinal fracture 01/16/2021   Suprapubic catheter (HCC) 01/16/2021   Encounter for routine gynecological examination 09/28/2013    Onychomycosis 09/28/2013   Foot deformity, acquired 03/26/2012   Encounter for preventive health examination 12/25/2010   ROSACEA 08/25/2009   Disturbance in sleep behavior 03/11/2008   SKIN CANCER, HX OF 03/11/2008   DYSURIA, HX OF 03/11/2008   Hyperlipidemia 02/10/2007   CERVICALGIA 02/10/2007    ONSET DATE: 07/28/2020  Date of Referral 08/29/2023   REFERRING DIAG: G82.54 (ICD-10-CM) - Quadriplegia, C5-C8, incomplete  THERAPY DIAG:  Muscle weakness (generalized)  Other lack of coordination  Other symptoms and signs involving the nervous system  Other symptoms and signs involving the musculoskeletal system  Contracture of hand joint, left  Contracture of hand joint, right  Stiffness of right hand, not elsewhere classified  Other disturbances of skin sensation  Quadriplegia, C5-C7 complete (HCC)  Rationale for Evaluation and Treatment: Rehabilitation  SUBJECTIVE:   SUBJECTIVE STATEMENT: Pt reports they have not been able to complete alternating digit extension and flexion application at home.    Pt accompanied by: Caregiver, [name]   PERTINENT HISTORY: "Pt is a 73 yr old L handed female with hx of incomplete quadriplegia- 2/14 2022- fleeing the police in [place] on passenger 100 (high speed) miles/hour,  [name]; neurogenic bowel and bladder and spasticity; [name], [name] Here for f/u on Incomplete quadriplegia"  B femur fractures December, 2024.  PRECAUTIONS: Fall; suprapubic catheter (she wants to get this removed  meaning she needs to get to and from the toilet); she has had minor heat sensation when needing to complete her bowel program-possible AD?   WEIGHT BEARING RESTRICTIONS: No  PAIN: - reports average pain as noted below. Will notify therapist if there are changes in her pain.  Are you having pain? Yes: NPRS scale: 3/10 Pain location: fingers to elbow bilaterally Pain description: constant Aggravating factors: it can increased  over time ie) is worse at the end of the day.  Also cold affects cramps and function. Relieving factors: gabapentin  and baclofen  for spasms, nightly stretching  FALLS: Has patient fallen in last 6 months? Yes. Number of falls 1  LIVING ENVIRONMENT: Lives with: lives with their family - husband [name] and with an adult companion s/p moving back up from [place]  x10 months Lives in: House/apartment Stairs: 4 story town house with an Engineer, structural with threshold adjustments, roll in shower with transport chair Has following equipment at home: Wheelchair (power) - with seat height adjustments to access counters and reclining option, Wheelchair (manual), transport WC, shower chair, and Ramped entry, handheld showerhead with rails around toilet, had Alen Amy but is no longer in need of it, has slide boards x3  PLOF: Requires assistive device for independence, Needs assistance with ADLs, Needs assistance with homemaking, Needs assistance with gait, and Needs assistance with transfers; 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  Used to like to knit, sew and bake.  PATIENT GOALS: improve spasticity and use of hands  OBJECTIVE:   HAND DOMINANCE: Left  ADLs: Overall ADLs: Patient has a live in caregiver  Transfers/ambulation related to ADLs: min assist with sliding board transfers.  Eating: Has a rocker knife that she can use. Used to use adapted utensils but now uses regular utensils but still will get assistance to cut food ie) when eating out.  Grooming: can brush her own hair with LUE only; unable to manage jewelry ie) earrings  UB Dressing: can zip/unzip after it has been started, unable to manage buttons herself, Caregiver assists but if she has extra time, she can put on her bra, and a loose fitting pullover shirt/t-shirt  LB Dressing: dependent for LB dressing in bed and with special sock donner for LE compression garments   Toileting: bladder trained with suprapubic catheter which  she clamps off.  Dependent for bowel incontinence care.  Bathing: Sponge bath with adult washclothes.  Can bathe UB with back scrubber for most of her back.  Needs help with feet (mentioned she might need a separate brush for feet)   Tub Shower transfers: Min assist with slide board to wheel in shower chair  Equipment: Shower seat with back, Walk in shower, bed side commode, Reacher, Sock aid, Long handled sponge, and Feeding equipment  IADLs: --  Shopping: Assisted by caregiver  Light housekeeping: Has housekeeper that comes monthly  Meal Prep: previously enjoyed baking. Assisted by caregiver but has reheated a meal for herself after getting food out of the fridge/freezer from her WC.  Community mobility: Dependent  Medication management: Caregiver sorts them into pillbox but she is very aware of her medications   Financial management: Patient manages her own finances  Handwriting: Increased time and has a pen with a little grip  MOBILITY STATUS: Independent with power mobility  ACTIVITY TOLERANCE: Activity tolerance: good to Fair - MMT WFL but has limited sustained tolerance for ongoing use of Ues with poor trunk control  FUNCTIONAL OUTCOME MEASURES:  PSFS: 3.3 total score  10/12/2023: 3.7 total score   Total score = sum of the activity scores/number of activities Minimum detectable change (90%CI) for average score = 2 points Minimum detectable change (90%CI) for single activity score = 3 points   UPPER EXTREMITY ROM:   AROM - WFL without obvious contractures, some digital flexion noted but PROM WNL   UPPER EXTREMITY MMT:   Grossly WFL - Endurance limited R tricep strength > than L but L UE generally stronger than R UE  MMT Right (eval) Left (eval)  Shoulder flexion 4/5 4/5  Shoulder abduction 4/5 4/5  Elbow flexion 4/5 4/5  Elbow extension 4/5 4/5  (Blank rows = not tested)  HAND FUNCTION: Grip strength: Right: 4.4 lbs (decline) ; Left: 18 lbs (slight  improvement)  COORDINATION: 09/29/23 s/p Botox  injections yesterday  Left: 56.13 sec Right 3:39.58 min  SENSATION: Light touch: Impaired  - patient   EDEMA: NA for UEs but LE has poor lymph drainage with custom compression garments   MUSCLE TONE: Generally WFL   COGNITION: Overall cognitive status: Within functional limits for tasks assessed  VISION: Subjective report: Patent wears progressive lens/glasses.  Denies diplopia or vision changes. Baseline vision: Wears glasses all the time  VISION ASSESSMENT: WFL  OBSERVATIONS: Patient independent with power WC navigation within clinic.  Patient is well-kept with foley catheter in place.  She has slight limitations in full extension of digits but PROM is WNL.    TODAY'S TREATMENT:                                                                                            - Neuro re-education completed for duration as noted below including: Pt's caregiver applied transcutaneous spinal stimulator for duration of  Trash Dice game to work on picking up individual small objects/dice. Pt engaged in picking up dice 20+ times each game x 2 games today to promote B fine motor control and ROM. OT educated pt and caregiver on alternating NMES for BUE applications as needed to go between digit flexion and extension to promote functional grasp and release. OT had caregiver take photos of application spots to promote accurate carryover.   PATIENT EDUCATION: Education details: UE digital ROM activities; NMES use Person educated: Patient and Caregiver - Programmer, systems Education method: Explanation, Demonstration, and Verbal cues Education comprehension: verbalized understanding, returned demonstration, verbal cues required, and needs further education  HOME EXERCISE PROGRAM: Previously issued HEP per DC 12/02/22: All previous HEPs combined to 1 complete List through [employer] Access Code: [email or web address] 10/10/2023: Alternative [employer] application guidance  GOALS:    SHORT TERM GOALS: Target date: 10/07/2023   1. Patient will verbalize understanding of AE/modified techniques to improve independence and safety with ADL and IADL completion. Baseline: Caregiver/spouse assist Goal status: IN PROGRESS  2.  Pt will be independent with BUE braces/splints as needed to prevent contracture and improve functional use of hands.  Baseline: Caregiver/spouse assist Goal status: MET  LONG TERM GOALS: Target date: 11/04/2023  Patient will demonstrate independence with updated HEP for UE strengthening, coordination and ROM to prevent contractures and maintain strength for transfers and ADLs. Baseline: Previous HEPs have  been established but need to be reviewed and updated.  Goal status: IN Progress  2.  Patient will report at least two-point increase in average PSFS score or at least three-point increase in a single activity score indicating functionally significant improvement given minimum detectable change. Baseline: 3.3 total score (See above for individual activity scores)  Goal status: IN Progress  3.  Patient will demonstrate at least 10 lbs R grip strength as needed to open jars and other containers. Baseline: 4.4 lbs 09/28/23: Botox  injections  10/12/2023: R - 1.7 lbs; L - 4.1, 7.4 lbs Goal status: IN PROGRESS  ASSESSMENT:  CLINICAL IMPRESSION: Patient demonstrates good tolerance with NMES alternating application and good understanding of incorporation of functional grasp and release as needed to promote improved ROM and fine motor coordination. Unable to achieve full range though good muscle contraction achieved. Caregiver took photos of pt's electrode application to promote accurate use and carryover.   PERFORMANCE DEFICITS: in functional skills including ADLs, IADLs, coordination, dexterity, strength, muscle spasms, Fine motor control, Gross motor control, continence, skin integrity, and UE functional use,   IMPAIRMENTS: are limiting patient from ADLs,  IADLs, work, and leisure.   CO-MORBIDITIES: has co-morbidities such as incontinence and wound that affects occupational performance. Patient will benefit from skilled OT to address above impairments and improve overall function.  REHAB POTENTIAL: Fair due to chronicity of injury  PLAN:  OT FREQUENCY: 1-2x/week (pt agreeable to starting off at 2xweek then tapering with the idea that she will take a break from therapy following this therapy episode.)  OT DURATION: 8 weeks  PLANNED INTERVENTIONS: self care/ADL training, therapeutic exercise, therapeutic activity, neuromuscular re-education, manual therapy, passive range of motion, balance training, functional mobility training, splinting, patient/family education, energy conservation, coping strategies training, and DME and/or AE instructions  RECOMMENDED OTHER SERVICES: Patient was seen for PT evaluation today with treatment plans coordinated for 2x/week.  CONSULTED AND AGREED WITH PLAN OF CARE: Patient and family member/caregiver  PLAN FOR NEXT SESSION: ASSESS need to change POC NMES review second unit reciprocal option  Check splints PRN Review/progress HEPs Explore FM tasks and establish weight shifting instruction for pressure relief.  Cutting, drink opening, hair  other ADL AE  Altamease Asters, OT 10/21/2023, 1:59 PM

## 2023-10-21 ENCOUNTER — Ambulatory Visit: Admitting: Physical Therapy

## 2023-10-21 ENCOUNTER — Encounter: Payer: Self-pay | Admitting: Physical Therapy

## 2023-10-21 ENCOUNTER — Ambulatory Visit: Admitting: Occupational Therapy

## 2023-10-21 DIAGNOSIS — R2689 Other abnormalities of gait and mobility: Secondary | ICD-10-CM | POA: Diagnosis not present

## 2023-10-21 DIAGNOSIS — R208 Other disturbances of skin sensation: Secondary | ICD-10-CM

## 2023-10-21 DIAGNOSIS — M25641 Stiffness of right hand, not elsewhere classified: Secondary | ICD-10-CM | POA: Diagnosis not present

## 2023-10-21 DIAGNOSIS — R293 Abnormal posture: Secondary | ICD-10-CM | POA: Diagnosis not present

## 2023-10-21 DIAGNOSIS — R278 Other lack of coordination: Secondary | ICD-10-CM

## 2023-10-21 DIAGNOSIS — M6281 Muscle weakness (generalized): Secondary | ICD-10-CM | POA: Diagnosis not present

## 2023-10-21 DIAGNOSIS — M24542 Contracture, left hand: Secondary | ICD-10-CM

## 2023-10-21 DIAGNOSIS — R29898 Other symptoms and signs involving the musculoskeletal system: Secondary | ICD-10-CM | POA: Diagnosis not present

## 2023-10-21 DIAGNOSIS — R29818 Other symptoms and signs involving the nervous system: Secondary | ICD-10-CM

## 2023-10-21 DIAGNOSIS — G8253 Quadriplegia, C5-C7 complete: Secondary | ICD-10-CM

## 2023-10-21 DIAGNOSIS — M24541 Contracture, right hand: Secondary | ICD-10-CM

## 2023-10-21 NOTE — Therapy (Signed)
 OUTPATIENT PHYSICAL THERAPY NEURO TREATMENT Patient Name: [name] MRN: [id number] DOB:1950-07-24, 73 y.o., female Today's Date: 10/21/2023   PCP: Reginal Capra, MD REFERRING PROVIDER: Celia Coles, MD  END OF SESSION:  PT End of Session - 10/21/23 1010     Visit Number 12    Number of Visits 17   with eval   Date for PT Re-Evaluation 11/18/23   to allow for scheduling delays   Authorization Type [employer] MEDICARE    PT Start Time 1015    PT Stop Time 1104    PT Time Calculation (min) 49 min    Equipment Utilized During Treatment Gait belt    Activity Tolerance Patient tolerated treatment well    Behavior During Therapy WFL for tasks assessed/performed                    Past Medical History:  Diagnosis Date   CERVICAL POLYP 03/11/2008   Qualifier: Diagnosis of  By: Ethel Henry MD, Joaquim Muir    Colon polyps 2005   on colonscopy Dr. Sandrea Cruel   Fibroid 2004   Per Dr. Alva Jewels   History of shingles    face and mouth   Hx of skin cancer, basal cell    Rosacea    Sciatica of left side 09/28/2013   Scoliosis    noted on mri done for back pain   Past Surgical History:  Procedure Laterality Date   BUNIONECTOMY     Patient Active Problem List   Diagnosis Date Noted   Buttock wound, left, subsequent encounter 03/16/2023   Bronchiectasis with acute exacerbation (HCC) 03/15/2023   Buttock wound, left, initial encounter 11/02/2022   Orthostatic hypotension 08/13/2022   Neurogenic bowel 05/03/2022   Spasticity 05/03/2022   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/03/2022   Nerve pain 05/03/2022   Medication monitoring encounter 01/08/2022   Neurogenic bladder 10/11/2021   Urinary incontinence 10/11/2021   ESBL (extended spectrum beta-lactamase) producing bacteria infection 10/09/2021   Recurrent UTI 10/09/2021   Quadriplegia, C5-C7 incomplete (HCC) 01/16/2021   History of spinal fracture 01/16/2021   Suprapubic catheter (HCC) 01/16/2021   Encounter for routine gynecological  examination 09/28/2013   Onychomycosis 09/28/2013   Foot deformity, acquired 03/26/2012   Encounter for preventive health examination 12/25/2010   ROSACEA 08/25/2009   Disturbance in sleep behavior 03/11/2008   SKIN CANCER, HX OF 03/11/2008   DYSURIA, HX OF 03/11/2008   Hyperlipidemia 02/10/2007   CERVICALGIA 02/10/2007    ONSET DATE: 08/29/2023 (referral date)  REFERRING DIAG: G82.54 (ICD-10-CM) - Incomplete quadriplegia at C5-C8 level (HCC)  THERAPY DIAG:  Muscle weakness (generalized)  Other lack of coordination  Other symptoms and signs involving the nervous system  Other symptoms and signs involving the musculoskeletal system  Abnormal posture  Rationale for Evaluation and Treatment: Rehabilitation  SUBJECTIVE:  SUBJECTIVE STATEMENT: Pt reports no acute changes since last visit.  She is tired, but ready to try long-sitting as she hasn't done this in a long time.   From initial eval: Pt familiar to this clinic, last seen Oct-Nov 2024 but has been seen for multiple POCs since her initial injury in 2022. Pt returns to this clinic after being in [place]  for the past few months. While in [place]  in December 2024 patient had a fall where she slid forwards out of her wheelchair onto the floor, ended up fracturing both of her femurs. Pt was hospitalized for 10-11 days and had surgical repair of her femurs. Pt reports she has been cleared of all restrictions since surgery, does have ongoing swelling in both legs and worsened spasticity. Pt reports that the spasticity has slightly improved since it initially started, was told by Dr. Lovorn the swelling may not resolve for 6-8 months (it has been 3 months) and not sure if spasticity will resolve but patient is hopeful that if her swelling improves her  spasticity will improve as well.  Pt asking about other options to help manage the swelling her legs, has tried variable compression stockings (knee high) and her PTs in [place]  recommended tight shapewear. Pt reports that the knee-high compression stockings led to increased swelling in her knees and the shapewear did not help her swelling. Encouraged patient to get thigh-high compression stockings and elevate her LE in PWC. Pt has ordered an articulating bed that will get here next Monday (3/31) so she can better elevate her legs when in bed.  Pt is also concerned about decreased ROM in her legs, [name] works on stretching her legs frequently but she feels she has more motion in her LLE as compared to RLE and may have mild foot drop on her R side. Pt does have PRAFOs to wear at night. Pt has worked up to standing in her standing frame x 30-40 min at a time. Pt also worked on standing with her PT and in // bars in [place] . Pt also reports with her injuries she lost the ability to lock/unlock her R knee but that it is getting better. She reports she lost a lot of stamina during her hospital stay as well.  Pt also has a new wound since last seen in this clinic in her L gluteal fold, shearing injury. Pt was seeing wound care in [place]  and is scheduled to see wound care with [facility] early April (was not able to schedule with [facility] wound care until late April).  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 Yes: NPRS scale: 3-4 Pain location: elbows to fingertips on both arms Pain description: nerve pain Aggravating factors: not stated Relieving factors: not stated  PRECAUTIONS: Fall and Other: osteoporosis  RED FLAGS: None   WEIGHT BEARING RESTRICTIONS: No  FALLS: Has patient fallen in last 6 months? Yes. Number of falls 1 fall in [place]  that resulted in B femur  fractures  LIVING ENVIRONMENT: Lives with: lives with their spouse and and with full-time caregiver [name] Lives in: House/apartment Home is power wheelchair accessible Has following equipment at home: Wheelchair (power), Wheelchair (manual), Grab bars, Ramped entry, and standing frame, slide board  PLOF: Independent with household mobility with device, Independent with community mobility with device, Requires assistive device for independence, Needs assistance with ADLs, and Needs assistance with  transfers  PATIENT GOALS: "still working on stand and pivot with goal to pivot to commode or to a chair" "work on core-will help me with standing" "improve my stamina - being in the hospital I lost strength/endurance"   OBJECTIVE:  Note: Objective measures were completed at Evaluation unless otherwise noted.  DIAGNOSTIC FINDINGS: None update/relevant to this POC  COGNITION: Overall cognitive status: Within functional limits for tasks assessed   SENSATION: Decreased sensation in BUE and BLE secondary to incomplete quadriplegia Decreased sensation in proximal LLE as compared to distal LE  EDEMA:  Circumferential: R knee: 17"; L knee: 17.5" and Figure 8: R ankle 21", L ankle 21.5"  MUSCLE TONE: increased spasticity in BLE   POSTURE: rounded shoulders and forward head  LOWER EXTREMITY ROM:     Passive  Right Eval Left Eval  Hip flexion Tight hip flexors Tight hip flexors  Hip extension    Hip abduction    Hip adduction    Hip internal rotation    Hip external rotation    Knee flexion Tight HS Tight HS  Knee extension    Ankle dorsiflexion Decreased, tight gastroc Decreased, tight gastroc  Ankle plantarflexion    Ankle inversion    Ankle eversion     (Blank rows = not tested)  LOWER EXTREMITY MMT:    MMT Right Eval Left Eval  Hip flexion 1 2-  Hip extension    Hip abduction    Hip adduction    Hip internal rotation    Hip external rotation    Knee flexion 0 3  Knee  extension 2- 2-  Ankle dorsiflexion 2- 3  Ankle plantarflexion    Ankle inversion    Ankle eversion    (Blank rows = not tested)  BED MOBILITY:  From previous POC: Sit to supine Mod A Supine to sit Mod A Rolling to Right Mod A Rolling to Left Mod A Undulating mattress for wound management on standard bed (elevated-so often doing uphill sliding board transfers); she would like to continue working on sitting up independently, she has been working on rolling, needs less assistance w/ this when someone props her leg into hooklying; would like something to help her pull her left leg to her butt for stretching as well as bed mobility.  TRANSFERS: From previous POC: Pt continues using combination of bump over, slide board, and depression (squat pivot) transfers.                                                                                                                              TREATMENT:   [employer] -Attempted bumping on slide board to the left w/c > mat table (slightly downhill), pt only able to slide w/ multiple attempts despite LE placement.  She has trouble with adequate lift trying alternate hand placements.    +2 transfer to supported long-sit > using peanut ball to bolster in upright for gentle LE stretch > gradually reduced support  and had pt practice walking arm into tricep press and forward lean to self-supported in long-sit x5 reps using supported rest as needed > tried supine to elbow prop w/ variable setup from supine > large ramp > narrow ramp and pillow > bolster w/ towel w/o success unless >/=modA due to difficulty using head support and trunk momentum against gravity, tried various rolling techniques to attempt elbow prop and tricep extension w/ weight shifting w/o success w/o significant posterior support from therapist.  Returned to EOM +2  Downhill slide board transfer back to PWC to right with min A at end of session due to fatigue.  Pt left seated in [facility] w/ caregiver  for handoff to OT.  PATIENT EDUCATION: Education details: Continue PROM at home.  Person educated: Patient and Arts administrator Education method: Explanation Education comprehension: verbalized understanding  HOME EXERCISE PROGRAM: Will be established as needed as pt has done continuous therapy and is working towards functional tasks.   GOALS: Goals reviewed with patient? Yes  SHORT TERM GOALS: Target date: 10/07/2023  HEP to be established for stretching and strengthening as appropriate. Baseline: not established at initial eval, reviewed during PT POC - pt and [name] report it is going well at home (4/25) Goal status: MET  2.  Pt will perform sit to stand transfer with LRAD with mod A Baseline: max A to stedy (4/4), max A in // bars (4/25) Goal status: IN PROGRESS  3.  Pt to tolerate standing x 5 min with LRAD to demonstrate improved endurance Baseline: 30 sec in stedy (initial), 2:35 min in // bars (4/25) Goal status: IN PROGRESS  4.  Pt to demonstrate reduced edema in BLE with a reduction in circumference/figure 8 measurement by 0.5" from initial evaluation. Baseline: Circumferential: R knee: 17"; L knee: 17.5" and Figure 8: R ankle 21", L ankle 21.5" 4/25: R knee 16", L knee: 16 11/16", R ankle: 21", L ankle 20" Goal status: IN PROGRESS   LONG TERM GOALS: Target date: 11/04/2023   Patient and her caregiver to be independent with performance of HEP for stretching and strengthening as appropriate. Baseline: not established at initial eval, reviewed during PT POC - pt and [name] report it is going well at home (4/25) Goal status: INITIAL  2.  Pt will perform sit to stand transfer with LRAD with min A Baseline: max A to stedy (4/4), max A in // bars (4/25) Goal status: INITIAL  3.  Pt to tolerate standing x 5 min with LRAD to demonstrate improved endurance Baseline: 30 sec in stedy (initial), 2:35 min in // bars (4/25) Goal status: REVISED  4.  Pt to demonstrate reduced  edema in BLE with a reduction in circumference/figure 8 measurement by 1" from initial evaluation. Baseline: Circumferential: R knee: 17"; L knee: 17.5" and Figure 8: R ankle 21", L ankle 21.5" 4/25: R knee 16", L knee: 16 11/16", R ankle: 21", L ankle 20" Goal status: INITIAL    ASSESSMENT:  CLINICAL IMPRESSION: Focus of skilled session today on long-sitting and ways to promote pt independence into this position using core engagement and momentum.  She was most challenged by attempting elbow prop from supine today w/o success with PT using various small props and techniques to improve independence.  She continues to benefit from skilled PT to address core and proximal weakness as it impacts functional mobility.  Will continue per POC.  OBJECTIVE IMPAIRMENTS: decreased balance, decreased endurance, decreased mobility, difficulty walking, decreased ROM, decreased strength, increased edema,  impaired perceived functional ability, increased muscle spasms, impaired flexibility, impaired sensation, impaired tone, impaired UE functional use, postural dysfunction, and pain.   ACTIVITY LIMITATIONS: carrying, lifting, bending, standing, stairs, transfers, bed mobility, continence, bathing, toileting, dressing, reach over head, and hygiene/grooming  PARTICIPATION LIMITATIONS: meal prep, cleaning, laundry, driving, shopping, community activity, and occupation  PERSONAL FACTORS: Age, Sex, Time since onset of injury/illness/exacerbation, and 1-2 comorbidities:   C7 ASIA C- incomplete quad w/ neurogenic bladder and bowel, HLD, Hx of skin cancerare also affecting patient's functional outcome.   REHAB POTENTIAL: Good  CLINICAL DECISION MAKING: Stable/uncomplicated  EVALUATION COMPLEXITY: High  PLAN:  PT FREQUENCY: 2x/week  PT DURATION: 8 weeks  PLANNED INTERVENTIONS: 97164- PT Re-evaluation, 97110-Therapeutic exercises, 97530- Therapeutic activity, 97112- Neuromuscular re-education, 97535- Self Care,  97140- Manual therapy, [phone]- Gait training, 60454- Orthotic Fit/training, [id number]- Electrical stimulation (manual), Patient/Family education, Balance training, Stair training, Taping, Dry Needling, Joint mobilization, Scar mobilization, Compression bandaging, DME instructions, Wheelchair mobility training, Cryotherapy, and Moist heat  PLAN FOR NEXT SESSION: any questions over PROM/stretching? sit to stands, standing tolerance with stedy, in // bars, possibly with RW, core strengthening/stability, endurance; wants to have conversation about taking a break (3-6 months) after this POC near end of this POC, standing lateral weight shifts, mini-squats in standing?, work on bump transfers, assess cushions if patient brings them - TT to reach out to [place] to set this up, Yoga block press-ups vs push-up blocks, long-sitting - unsupported for core work?, bumping on slide board  Earlean Glaze, PT, DPT   10/21/2023, 2:45 PM

## 2023-10-23 ENCOUNTER — Encounter: Payer: Self-pay | Admitting: Internal Medicine

## 2023-10-23 NOTE — Progress Notes (Signed)
 Iron saturation is  better   ferritin is still high  but much better  than last check. Ferritin can be temporarily elevated with inflammation unrelated to iron stores .    Liver panel is now normal . Can do lfts  iron  ferritin ibc panel  cbcdiff in  3 months to be sure all stable

## 2023-10-25 ENCOUNTER — Ambulatory Visit: Admitting: Physical Therapy

## 2023-10-25 ENCOUNTER — Ambulatory Visit: Admitting: Occupational Therapy

## 2023-10-25 ENCOUNTER — Ambulatory Visit: Payer: Self-pay

## 2023-10-25 DIAGNOSIS — R208 Other disturbances of skin sensation: Secondary | ICD-10-CM | POA: Diagnosis not present

## 2023-10-25 DIAGNOSIS — R278 Other lack of coordination: Secondary | ICD-10-CM

## 2023-10-25 DIAGNOSIS — R29818 Other symptoms and signs involving the nervous system: Secondary | ICD-10-CM

## 2023-10-25 DIAGNOSIS — M24542 Contracture, left hand: Secondary | ICD-10-CM | POA: Diagnosis not present

## 2023-10-25 DIAGNOSIS — Z79899 Other long term (current) drug therapy: Secondary | ICD-10-CM

## 2023-10-25 DIAGNOSIS — R293 Abnormal posture: Secondary | ICD-10-CM

## 2023-10-25 DIAGNOSIS — M24541 Contracture, right hand: Secondary | ICD-10-CM

## 2023-10-25 DIAGNOSIS — R29898 Other symptoms and signs involving the musculoskeletal system: Secondary | ICD-10-CM | POA: Diagnosis not present

## 2023-10-25 DIAGNOSIS — D649 Anemia, unspecified: Secondary | ICD-10-CM

## 2023-10-25 DIAGNOSIS — R2689 Other abnormalities of gait and mobility: Secondary | ICD-10-CM | POA: Diagnosis not present

## 2023-10-25 DIAGNOSIS — R7989 Other specified abnormal findings of blood chemistry: Secondary | ICD-10-CM

## 2023-10-25 DIAGNOSIS — M25641 Stiffness of right hand, not elsewhere classified: Secondary | ICD-10-CM | POA: Diagnosis not present

## 2023-10-25 DIAGNOSIS — M6281 Muscle weakness (generalized): Secondary | ICD-10-CM | POA: Diagnosis not present

## 2023-10-25 NOTE — Therapy (Signed)
 OUTPATIENT PHYSICAL THERAPY NEURO TREATMENT   Patient Name: [name] MRN: [id number] DOB:July 10, 1950, 73 y.o., female Today's Date: 10/25/2023   PCP: Reginal Capra, MD REFERRING PROVIDER: Celia Coles, MD  END OF SESSION:  PT End of Session - 10/25/23 1018     Visit Number 13    Number of Visits 17   with eval   Date for PT Re-Evaluation 11/18/23   to allow for scheduling delays   Authorization Type [employer] MEDICARE    PT Start Time 1015    PT Stop Time 1100    PT Time Calculation (min) 45 min    Equipment Utilized During Treatment Gait belt    Activity Tolerance Patient tolerated treatment well    Behavior During Therapy [facility] for tasks assessed/performed                     Past Medical History:  Diagnosis Date   CERVICAL POLYP 03/11/2008   Qualifier: Diagnosis of  By: Ethel Henry MD, Joaquim Muir    Colon polyps 2005   on colonscopy Dr. Sandrea Cruel   Fibroid 2004   Per Dr. Alva Jewels   History of shingles    face and mouth   Hx of skin cancer, basal cell    Rosacea    Sciatica of left side 09/28/2013   Scoliosis    noted on mri done for back pain   Past Surgical History:  Procedure Laterality Date   BUNIONECTOMY     Patient Active Problem List   Diagnosis Date Noted   Buttock wound, left, subsequent encounter 03/16/2023   Bronchiectasis with acute exacerbation (HCC) 03/15/2023   Buttock wound, left, initial encounter 11/02/2022   Orthostatic hypotension 08/13/2022   Neurogenic bowel 05/03/2022   Spasticity 05/03/2022   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/03/2022   Nerve pain 05/03/2022   Medication monitoring encounter 01/08/2022   Neurogenic bladder 10/11/2021   Urinary incontinence 10/11/2021   ESBL (extended spectrum beta-lactamase) producing bacteria infection 10/09/2021   Recurrent UTI 10/09/2021   Quadriplegia, C5-C7 incomplete (HCC) 01/16/2021   History of spinal fracture 01/16/2021   Suprapubic catheter (HCC) 01/16/2021   Encounter for routine  gynecological examination 09/28/2013   Onychomycosis 09/28/2013   Foot deformity, acquired 03/26/2012   Encounter for preventive health examination 12/25/2010   ROSACEA 08/25/2009   Disturbance in sleep behavior 03/11/2008   SKIN CANCER, HX OF 03/11/2008   DYSURIA, HX OF 03/11/2008   Hyperlipidemia 02/10/2007   CERVICALGIA 02/10/2007    ONSET DATE: 08/29/2023 (referral date)  REFERRING DIAG: G82.54 (ICD-10-CM) - Incomplete quadriplegia at C5-C8 level (HCC)  THERAPY DIAG:  Muscle weakness (generalized)  Other symptoms and signs involving the nervous system  Other symptoms and signs involving the musculoskeletal system  Abnormal posture  Rationale for Evaluation and Treatment: Rehabilitation  SUBJECTIVE:  SUBJECTIVE STATEMENT: Pt reports no acute changes since last visit.   From initial eval: Pt familiar to this clinic, last seen Oct-Nov 2024 but has been seen for multiple POCs since her initial injury in 2022. Pt returns to this clinic after being in [place]  for the past few months. While in [place]  in December 2024 patient had a fall where she slid forwards out of her wheelchair onto the floor, ended up fracturing both of her femurs. Pt was hospitalized for 10-11 days and had surgical repair of her femurs. Pt reports she has been cleared of all restrictions since surgery, does have ongoing swelling in both legs and worsened spasticity. Pt reports that the spasticity has slightly improved since it initially started, was told by Dr. Lovorn the swelling may not resolve for 6-8 months (it has been 3 months) and not sure if spasticity will resolve but patient is hopeful that if her swelling improves her spasticity will improve as well.  Pt asking about other options to help manage the swelling her  legs, has tried variable compression stockings (knee high) and her PTs in [place]  recommended tight shapewear. Pt reports that the knee-high compression stockings led to increased swelling in her knees and the shapewear did not help her swelling. Encouraged patient to get thigh-high compression stockings and elevate her LE in PWC. Pt has ordered an articulating bed that will get here next Monday (3/31) so she can better elevate her legs when in bed.  Pt is also concerned about decreased ROM in her legs, [name] works on stretching her legs frequently but she feels she has more motion in her LLE as compared to RLE and may have mild foot drop on her R side. Pt does have PRAFOs to wear at night. Pt has worked up to standing in her standing frame x 30-40 min at a time. Pt also worked on standing with her PT and in // bars in [place] . Pt also reports with her injuries she lost the ability to lock/unlock her R knee but that it is getting better. She reports she lost a lot of stamina during her hospital stay as well.  Pt also has a new wound since last seen in this clinic in her L gluteal fold, shearing injury. Pt was seeing wound care in [place]  and is scheduled to see wound care with [facility] early April (was not able to schedule with [facility] wound care until late April).  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 Yes: NPRS scale: 3-4 Pain location: elbows to fingertips on both arms Pain description: nerve pain Aggravating factors: not stated Relieving factors: not stated  PRECAUTIONS: Fall and Other: osteoporosis  RED FLAGS: None   WEIGHT BEARING RESTRICTIONS: No  FALLS: Has patient fallen in last 6 months? Yes. Number of falls 1 fall in [place]  that resulted in B femur fractures  LIVING ENVIRONMENT: Lives with: lives with their spouse and and with full-time caregiver  [name] Lives in: House/apartment Home is power wheelchair accessible Has following equipment at home: Wheelchair (power), Wheelchair (manual), Grab bars, Ramped entry, and standing frame, slide board  PLOF: Independent with household mobility with device, Independent with community mobility with device, Requires assistive device for independence, Needs assistance with ADLs, and Needs assistance with transfers  PATIENT GOALS: "still working on stand and pivot with goal to pivot to commode or to  a chair" "work on core-will help me with standing" "improve my stamina - being in the hospital I lost strength/endurance"   OBJECTIVE:  Note: Objective measures were completed at Evaluation unless otherwise noted.  DIAGNOSTIC FINDINGS: None update/relevant to this POC  COGNITION: Overall cognitive status: Within functional limits for tasks assessed   SENSATION: Decreased sensation in BUE and BLE secondary to incomplete quadriplegia Decreased sensation in proximal LLE as compared to distal LE  EDEMA:  Circumferential: R knee: 17"; L knee: 17.5" and Figure 8: R ankle 21", L ankle 21.5"  MUSCLE TONE: increased spasticity in BLE   POSTURE: rounded shoulders and forward head  LOWER EXTREMITY ROM:     Passive  Right Eval Left Eval  Hip flexion Tight hip flexors Tight hip flexors  Hip extension    Hip abduction    Hip adduction    Hip internal rotation    Hip external rotation    Knee flexion Tight HS Tight HS  Knee extension    Ankle dorsiflexion Decreased, tight gastroc Decreased, tight gastroc  Ankle plantarflexion    Ankle inversion    Ankle eversion     (Blank rows = not tested)  LOWER EXTREMITY MMT:    MMT Right Eval Left Eval  Hip flexion 1 2-  Hip extension    Hip abduction    Hip adduction    Hip internal rotation    Hip external rotation    Knee flexion 0 3  Knee extension 2- 2-  Ankle dorsiflexion 2- 3  Ankle plantarflexion    Ankle inversion    Ankle eversion     (Blank rows = not tested)  BED MOBILITY:  From previous POC: Sit to supine Mod A Supine to sit Mod A Rolling to Right Mod A Rolling to Left Mod A Undulating mattress for wound management on standard bed (elevated-so often doing uphill sliding board transfers); she would like to continue working on sitting up independently, she has been working on rolling, needs less assistance w/ this when someone props her leg into hooklying; would like something to help her pull her left leg to her butt for stretching as well as bed mobility.  TRANSFERS: From previous POC: Pt continues using combination of bump over, slide board, and depression (squat pivot) transfers.                                                                                                                              TREATMENT:   TherAct Pt received seated in her power wheelchair. Bump transfer PWC to mat table to the L with up to min A needed to assist with scooting her hips. Pt requires increased time to complete transfer with several bump-overs needed as well as fair clearance of her buttocks during transfer. Pt fatigued following transfer. Sitting EOM to semi-reclined in blue wedge and pillows on mat table with mod A for BLE management. Semi-reclined to long-sitting position with mod A  for trunk elevation, pt is able to assist by pushing up with her elbows, just needs assist to keep from falling backwards to mat table.  Once in long-sitting position pt able to perform HS stretch 3 x 30 sec leaning forwards and reaching towards her feet.  Long-sitting balance and core stabilization exercises: Small inflatable ball toss 3 x 15 reps to fatigue with CGA for sitting balance Weighted dowel rod (2#) chest press 2 x 10 reps to fatigue  Slide board transfer back to PWC with min A. Pt handed off to OT at end of session.  PATIENT EDUCATION: Education details: Continue PROM at home.  Person educated: Patient and Patent attorney Education method: Explanation Education comprehension: verbalized understanding  HOME EXERCISE PROGRAM: Will be established as needed as pt has done continuous therapy and is working towards functional tasks.   GOALS: Goals reviewed with patient? Yes  SHORT TERM GOALS: Target date: 10/07/2023  HEP to be established for stretching and strengthening as appropriate. Baseline: not established at initial eval, reviewed during PT POC - pt and [name] report it is going well at home (4/25) Goal status: MET  2.  Pt will perform sit to stand transfer with LRAD with mod A Baseline: max A to stedy (4/4), max A in // bars (4/25) Goal status: IN PROGRESS  3.  Pt to tolerate standing x 5 min with LRAD to demonstrate improved endurance Baseline: 30 sec in stedy (initial), 2:35 min in // bars (4/25) Goal status: IN PROGRESS  4.  Pt to demonstrate reduced edema in BLE with a reduction in circumference/figure 8 measurement by 0.5" from initial evaluation. Baseline: Circumferential: R knee: 17"; L knee: 17.5" and Figure 8: R ankle 21", L ankle 21.5" 4/25: R knee 16", L knee: 16 11/16", R ankle: 21", L ankle 20" Goal status: IN PROGRESS   LONG TERM GOALS: Target date: 11/04/2023   Patient and her caregiver to be independent with performance of HEP for stretching and strengthening as appropriate. Baseline: not established at initial eval, reviewed during PT POC - pt and [name] report it is going well at home (4/25) Goal status: INITIAL  2.  Pt will perform sit to stand transfer with LRAD with min A Baseline: max A to stedy (4/4), max A in // bars (4/25) Goal status: INITIAL  3.  Pt to tolerate standing x 5 min with LRAD to demonstrate improved endurance Baseline: 30 sec in stedy (initial), 2:35 min in // bars (4/25) Goal status: REVISED  4.  Pt to demonstrate reduced edema in BLE with a reduction in circumference/figure 8 measurement by 1" from initial evaluation. Baseline: Circumferential:  R knee: 17"; L knee: 17.5" and Figure 8: R ankle 21", L ankle 21.5" 4/25: R knee 16", L knee: 16 11/16", R ankle: 21", L ankle 20" Goal status: INITIAL    ASSESSMENT:  CLINICAL IMPRESSION: Emphasis of skilled PT session on working on bump transfers w/c to mat table, HS stretching in long-sitting position, and sitting balance/core stability in long-sitting position. Pt does need physical assist to prevent posterior trunk LOB when transitioning from semi-reclined on wedge to long-sitting on mat table but then is able to maintain long-sit position with SBA. She does require up to CGA in long-sitting position while engaging in UB tasks for safety. She continues to benefit from skilled PT services to work towards increased safety and independence with functional transfers and sitting balance. Continue POC.   OBJECTIVE IMPAIRMENTS: decreased balance, decreased endurance, decreased mobility, difficulty  walking, decreased ROM, decreased strength, increased edema, impaired perceived functional ability, increased muscle spasms, impaired flexibility, impaired sensation, impaired tone, impaired UE functional use, postural dysfunction, and pain.   ACTIVITY LIMITATIONS: carrying, lifting, bending, standing, stairs, transfers, bed mobility, continence, bathing, toileting, dressing, reach over head, and hygiene/grooming  PARTICIPATION LIMITATIONS: meal prep, cleaning, laundry, driving, shopping, community activity, and occupation  PERSONAL FACTORS: Age, Sex, Time since onset of injury/illness/exacerbation, and 1-2 comorbidities:   C7 ASIA C- incomplete quad w/ neurogenic bladder and bowel, HLD, Hx of skin cancerare also affecting patient's functional outcome.   REHAB POTENTIAL: Good  CLINICAL DECISION MAKING: Stable/uncomplicated  EVALUATION COMPLEXITY: High  PLAN:  PT FREQUENCY: 2x/week  PT DURATION: 8 weeks  PLANNED INTERVENTIONS: 97164- PT Re-evaluation, 97110-Therapeutic exercises, 97530-  Therapeutic activity, 97112- Neuromuscular re-education, 97535- Self Care, 16109- Manual therapy, [phone]- Gait training, [phone]- Orthotic Fit/training, [phone]- Electrical stimulation (manual), Patient/Family education, Balance training, Stair training, Taping, Dry Needling, Joint mobilization, Scar mobilization, Compression bandaging, DME instructions, Wheelchair mobility training, Cryotherapy, and Moist heat  PLAN FOR NEXT SESSION: any questions over PROM/stretching? sit to stands, standing tolerance with stedy, in // bars, possibly with RW, core strengthening/stability, endurance; wants to have conversation about taking a break (3-6 months) after this POC near end of this POC, standing lateral weight shifts, mini-squats in standing?, work on bump transfers, assess cushions if patient brings them - TT to reach out to [place] to set this up, Yoga block press-ups vs push-up blocks, long-sitting - unsupported for core work?, bumping on [employer], standing  Lorita Rosa, PT Lorita Rosa, PT, DPT, CSRS    10/25/2023, 11:05 AM

## 2023-10-25 NOTE — Therapy (Signed)
 OUTPATIENT OCCUPATIONAL THERAPY NEURO TREATMENT   Patient Name: [name] MRN: [id number] DOB:1950-10-24, 73 y.o., female Today's Date: 10/25/2023  PCP: Reginal Capra, MD  REFERRING PROVIDER: Celia Coles, MD  END OF SESSION:  OT End of Session - 10/25/23 1108     Visit Number 13    Number of Visits 17    Date for OT Re-Evaluation 11/04/23    Authorization Type [employer] - auth approved    Authorization Time Period 09/09/2023 - 11/04/2023    OT Start Time 1105    OT Stop Time 1150    OT Time Calculation (min) 45 min    Equipment Utilized During Treatment Personal computer    Activity Tolerance Patient tolerated treatment well    Behavior During Therapy [facility] for tasks assessed/performed            Past Medical History:  Diagnosis Date   CERVICAL POLYP 03/11/2008   Qualifier: Diagnosis of  By: Ethel Henry MD, Joaquim Muir    Colon polyps 2005   on colonscopy Dr. Sandrea Cruel   Fibroid 2004   Per Dr. Alva Jewels   History of shingles    face and mouth   Hx of skin cancer, basal cell    Rosacea    Sciatica of left side 09/28/2013   Scoliosis    noted on mri done for back pain   Past Surgical History:  Procedure Laterality Date   BUNIONECTOMY     Patient Active Problem List   Diagnosis Date Noted   Buttock wound, left, subsequent encounter 03/16/2023   Bronchiectasis with acute exacerbation (HCC) 03/15/2023   Buttock wound, left, initial encounter 11/02/2022   Orthostatic hypotension 08/13/2022   Neurogenic bowel 05/03/2022   Spasticity 05/03/2022   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/03/2022   Nerve pain 05/03/2022   Medication monitoring encounter 01/08/2022   Neurogenic bladder 10/11/2021   Urinary incontinence 10/11/2021   ESBL (extended spectrum beta-lactamase) producing bacteria infection 10/09/2021   Recurrent UTI 10/09/2021   Quadriplegia, C5-C7 incomplete (HCC) 01/16/2021   History of spinal fracture 01/16/2021   Suprapubic catheter (HCC) 01/16/2021   Encounter  for routine gynecological examination 09/28/2013   Onychomycosis 09/28/2013   Foot deformity, acquired 03/26/2012   Encounter for preventive health examination 12/25/2010   ROSACEA 08/25/2009   Disturbance in sleep behavior 03/11/2008   SKIN CANCER, HX OF 03/11/2008   DYSURIA, HX OF 03/11/2008   Hyperlipidemia 02/10/2007   CERVICALGIA 02/10/2007    ONSET DATE: 07/28/2020  Date of Referral 08/29/2023   REFERRING DIAG: G82.54 (ICD-10-CM) - Quadriplegia, C5-C8, incomplete  THERAPY DIAG:  Other lack of coordination  Muscle weakness (generalized)  Other symptoms and signs involving the nervous system  Contracture of hand joint, left  Contracture of hand joint, right  Rationale for Evaluation and Treatment: Rehabilitation  SUBJECTIVE:   SUBJECTIVE STATEMENT: Pt brought her Surface Pro laptop to explore issues she has been having accessing the screen and with typing ie) typing too many of the same letters at times and having trouble touching small icons at times.    Pt accompanied by: Caregiver, [name]   PERTINENT HISTORY: "Pt is a 73 yr old L handed female with hx of incomplete quadriplegia- 2/14 2022- fleeing the police in [place] on passenger 100 (high speed) miles/hour,  [name]; neurogenic bowel and bladder and spasticity; [name], [name] Here for f/u on Incomplete quadriplegia"  B femur fractures December, 2024.  PRECAUTIONS: Fall; suprapubic catheter (she wants to get this  removed meaning she needs to get to and from the toilet); she has had minor heat sensation when needing to complete her bowel program-possible AD?   WEIGHT BEARING RESTRICTIONS: No  PAIN: - reports average pain as noted below. Will notify therapist if there are changes in her pain.  Are you having pain? Yes: NPRS scale: 3/10 Pain location: fingers to elbow bilaterally Pain description: constant Aggravating factors: it can increased over time ie) is worse at the end of the day.   Also cold affects cramps and function. Relieving factors: gabapentin  and baclofen  for spasms, nightly stretching  FALLS: Has patient fallen in last 6 months? Yes. Number of falls 1  LIVING ENVIRONMENT: Lives with: lives with their family - husband [name] and with an adult companion s/p moving back up from [place]  x10 months Lives in: House/apartment Stairs: 4 story town house with an Engineer, structural with threshold adjustments, roll in shower with transport chair Has following equipment at home: Wheelchair (power) - with seat height adjustments to access counters and reclining option, Wheelchair (manual), transport WC, shower chair, and Ramped entry, handheld showerhead with rails around toilet, had Alen Amy but is no longer in need of it, has slide boards x3  PLOF: Requires assistive device for independence, Needs assistance with ADLs, Needs assistance with homemaking, Needs assistance with gait, and Needs assistance with transfers; 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  Used to like to knit, sew and bake.  PATIENT GOALS: improve spasticity and use of hands  OBJECTIVE:   HAND DOMINANCE: Left  ADLs: Overall ADLs: Patient has a live in caregiver  Transfers/ambulation related to ADLs: min assist with sliding board transfers.  Eating: Has a rocker knife that she can use. Used to use adapted utensils but now uses regular utensils but still will get assistance to cut food ie) when eating out.  Grooming: can brush her own hair with LUE only; unable to manage jewelry ie) earrings  UB Dressing: can zip/unzip after it has been started, unable to manage buttons herself, Caregiver assists but if she has extra time, she can put on her bra, and a loose fitting pullover shirt/t-shirt  LB Dressing: dependent for LB dressing in bed and with special sock donner for LE compression garments   Toileting: bladder trained with suprapubic catheter which she clamps off.  Dependent for bowel incontinence  care.  Bathing: Sponge bath with adult washclothes.  Can bathe UB with back scrubber for most of her back.  Needs help with feet (mentioned she might need a separate brush for feet)   Tub Shower transfers: Min assist with slide board to wheel in shower chair  Equipment: Shower seat with back, Walk in shower, bed side commode, Reacher, Sock aid, Long handled sponge, and Feeding equipment  IADLs: --  Shopping: Assisted by caregiver  Light housekeeping: Has housekeeper that comes monthly  Meal Prep: previously enjoyed baking. Assisted by caregiver but has reheated a meal for herself after getting food out of the fridge/freezer from her WC.  Community mobility: Dependent  Medication management: Caregiver sorts them into pillbox but she is very aware of her medications   Financial management: Patient manages her own finances  Handwriting: Increased time and has a pen with a little grip  MOBILITY STATUS: Independent with power mobility  ACTIVITY TOLERANCE: Activity tolerance: good to Fair - MMT WFL but has limited sustained tolerance for ongoing use of Ues with poor trunk control  FUNCTIONAL OUTCOME MEASURES:  PSFS: 3.3 total score  10/12/2023: 3.7 total score   Total score = sum of the activity scores/number of activities Minimum detectable change (90%CI) for average score = 2 points Minimum detectable change (90%CI) for single activity score = 3 points   UPPER EXTREMITY ROM:   AROM - WFL without obvious contractures, some digital flexion noted but PROM WNL   UPPER EXTREMITY MMT:   Grossly WFL - Endurance limited R tricep strength > than L but L UE generally stronger than R UE  MMT Right (eval) Left (eval)  Shoulder flexion 4/5 4/5  Shoulder abduction 4/5 4/5  Elbow flexion 4/5 4/5  Elbow extension 4/5 4/5  (Blank rows = not tested)  HAND FUNCTION: Grip strength: Right: 4.4 lbs (decline) ; Left: 18 lbs (slight improvement)  COORDINATION: 09/29/23 s/p Botox  injections  yesterday  Left: 56.13 sec Right 3:39.58 min  SENSATION: Light touch: Impaired  - patient   EDEMA: NA for UEs but LE has poor lymph drainage with custom compression garments   MUSCLE TONE: Generally WFL   COGNITION: Overall cognitive status: Within functional limits for tasks assessed  VISION: Subjective report: Patent wears progressive lens/glasses.  Denies diplopia or vision changes. Baseline vision: Wears glasses all the time  VISION ASSESSMENT: WFL  OBSERVATIONS: Patient independent with power WC navigation within clinic.  Patient is well-kept with foley catheter in place.  She has slight limitations in full extension of digits but PROM is WNL.    TODAY'S TREATMENT:                                                                                            Therapeutic Activities Explored typing and computer access issues through various options including adjusting settings for "repeat delay" and "repeat rate" for keys to minimize accidentally typing multiple letters if she presses a letter too long.  Also assisted with adjusting some accessibility options for visual impairments to enlarge some icons slightly to allow easier access with finger tips. Various modifications made with pt education on how to make changes at home to further maximize ease of access and comfort with typing.  Therapeutic Exercises Pt engaged in digital ROM with education on digital extension on table top with wrist off the edge of the table (ie slightly extended to allow increased digital AROM).  In addition, pt education provided on stretching RUE digits into abduction to help with isolated finger movements as well.   PATIENT EDUCATION: Education details: UE digital ROM activities; Computer modifications Person educated: Patient and Caregiver - Programmer, systems Education method: Explanation, Demonstration, and Verbal cues Education comprehension: verbalized understanding, returned demonstration, verbal cues required,  and needs further education  HOME EXERCISE PROGRAM: Previously issued HEP per DC 12/02/22: All previous HEPs combined to 1 complete List through [employer] Access Code: [email or web address] 10/10/2023: Alternative [employer] application guidance  GOALS:   SHORT TERM GOALS: Target date: 10/07/2023   1. Patient will verbalize understanding of AE/modified techniques to improve independence and safety with ADL and IADL completion. Baseline: Caregiver/spouse assist Goal status: IN PROGRESS  2.  Pt will be independent with BUE braces/splints as needed to prevent contracture and improve functional use of hands.  Baseline: Caregiver/spouse assist Goal status: MET  LONG TERM GOALS: Target date: 11/04/2023  Patient will demonstrate independence with updated HEP for UE strengthening, coordination and ROM to prevent contractures and maintain strength for transfers and ADLs. Baseline: Previous HEPs have been established but need to be reviewed and updated.  Goal status: IN Progress  2.  Patient will report at least two-point increase in average PSFS score or at least three-point increase in a single activity score indicating functionally significant improvement given minimum detectable change. Baseline: 3.3 total score (See above for individual activity scores)  Goal status: IN Progress  3.  Patient will demonstrate at least 10 lbs R grip strength as needed to open jars and other containers. Baseline: 4.4 lbs 09/28/23: Botox  injections  10/12/2023: R - 1.7 lbs; L - 4.1, 7.4 lbs Goal status: IN PROGRESS  ASSESSMENT:  CLINICAL IMPRESSION: Patient demonstrates good understanding of computer modification trials to help with ease of typing and accessing touch screen.  Pt had good participation in digital ROM activities with slight modification to wrist/hand position to maximize ease with motions. Pt will benefit from continued skilled OT services in the outpatient setting to work on impairments to help pt maximize  function as able.     PERFORMANCE DEFICITS: in functional skills including ADLs, IADLs, coordination, dexterity, strength, muscle spasms, Fine motor control, Gross motor control, continence, skin integrity, and UE functional use,   IMPAIRMENTS: are limiting patient from ADLs, IADLs, work, and leisure.   CO-MORBIDITIES: has co-morbidities such as incontinence and wound that affects occupational performance. Patient will benefit from skilled OT to address above impairments and improve overall function.  REHAB POTENTIAL: Fair due to chronicity of injury  PLAN:  OT FREQUENCY: 1-2x/week (pt agreeable to starting off at 2xweek then tapering with the idea that she will take a break from therapy following this therapy episode.)  OT DURATION: 8 weeks  PLANNED INTERVENTIONS: self care/ADL training, therapeutic exercise, therapeutic activity, neuromuscular re-education, manual therapy, passive range of motion, balance training, functional mobility training, splinting, patient/family education, energy conservation, coping strategies training, and DME and/or AE instructions  RECOMMENDED OTHER SERVICES: Patient was seen for PT evaluation today with treatment plans coordinated for 2x/week.  CONSULTED AND AGREED WITH PLAN OF CARE: Patient and family member/caregiver  PLAN FOR NEXT SESSION:   ASSESS need to change POC NMES review second unit reciprocal option  Check splints PRN Review/progress HEPs Explore FM tasks and establish weight shifting instruction for pressure relief.  Cutting, drink opening, hair  other ADL AE (jar opener)  Zora Hires, OT 10/25/2023, 6:42 PM

## 2023-10-28 ENCOUNTER — Ambulatory Visit: Admitting: Physical Therapy

## 2023-10-28 ENCOUNTER — Ambulatory Visit: Admitting: Occupational Therapy

## 2023-10-28 DIAGNOSIS — R278 Other lack of coordination: Secondary | ICD-10-CM | POA: Diagnosis not present

## 2023-10-28 DIAGNOSIS — M6281 Muscle weakness (generalized): Secondary | ICD-10-CM

## 2023-10-28 DIAGNOSIS — R208 Other disturbances of skin sensation: Secondary | ICD-10-CM | POA: Diagnosis not present

## 2023-10-28 DIAGNOSIS — R29898 Other symptoms and signs involving the musculoskeletal system: Secondary | ICD-10-CM

## 2023-10-28 DIAGNOSIS — R293 Abnormal posture: Secondary | ICD-10-CM

## 2023-10-28 DIAGNOSIS — R29818 Other symptoms and signs involving the nervous system: Secondary | ICD-10-CM | POA: Diagnosis not present

## 2023-10-28 DIAGNOSIS — M25641 Stiffness of right hand, not elsewhere classified: Secondary | ICD-10-CM | POA: Diagnosis not present

## 2023-10-28 DIAGNOSIS — R2689 Other abnormalities of gait and mobility: Secondary | ICD-10-CM | POA: Diagnosis not present

## 2023-10-28 DIAGNOSIS — M24542 Contracture, left hand: Secondary | ICD-10-CM | POA: Diagnosis not present

## 2023-10-28 NOTE — Therapy (Signed)
 OUTPATIENT PHYSICAL THERAPY NEURO TREATMENT   Patient Name: [name] MRN: [id number] DOB:05-29-1951, 73 y.o., female Today's Date: 10/28/2023   PCP: Reginal Capra, MD REFERRING PROVIDER: Celia Coles, MD  END OF SESSION:  PT End of Session - 10/28/23 1017     Visit Number 14    Number of Visits 17   with eval   Date for PT Re-Evaluation 11/18/23   to allow for scheduling delays   Authorization Type [employer] MEDICARE    PT Start Time 1015    PT Stop Time 1100    PT Time Calculation (min) 45 min    Equipment Utilized During Treatment Gait belt    Activity Tolerance Patient tolerated treatment well    Behavior During Therapy [facility] for tasks assessed/performed                      Past Medical History:  Diagnosis Date   CERVICAL POLYP 03/11/2008   Qualifier: Diagnosis of  By: Ethel Henry MD, Joaquim Muir    Colon polyps 2005   on colonscopy Dr. Sandrea Cruel   Fibroid 2004   Per Dr. Alva Jewels   History of shingles    face and mouth   Hx of skin cancer, basal cell    Rosacea    Sciatica of left side 09/28/2013   Scoliosis    noted on mri done for back pain   Past Surgical History:  Procedure Laterality Date   BUNIONECTOMY     Patient Active Problem List   Diagnosis Date Noted   Buttock wound, left, subsequent encounter 03/16/2023   Bronchiectasis with acute exacerbation (HCC) 03/15/2023   Buttock wound, left, initial encounter 11/02/2022   Orthostatic hypotension 08/13/2022   Neurogenic bowel 05/03/2022   Spasticity 05/03/2022   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/03/2022   Nerve pain 05/03/2022   Medication monitoring encounter 01/08/2022   Neurogenic bladder 10/11/2021   Urinary incontinence 10/11/2021   ESBL (extended spectrum beta-lactamase) producing bacteria infection 10/09/2021   Recurrent UTI 10/09/2021   Quadriplegia, C5-C7 incomplete (HCC) 01/16/2021   History of spinal fracture 01/16/2021   Suprapubic catheter (HCC) 01/16/2021   Encounter for routine  gynecological examination 09/28/2013   Onychomycosis 09/28/2013   Foot deformity, acquired 03/26/2012   Encounter for preventive health examination 12/25/2010   ROSACEA 08/25/2009   Disturbance in sleep behavior 03/11/2008   SKIN CANCER, HX OF 03/11/2008   DYSURIA, HX OF 03/11/2008   Hyperlipidemia 02/10/2007   CERVICALGIA 02/10/2007    ONSET DATE: 08/29/2023 (referral date)  REFERRING DIAG: G82.54 (ICD-10-CM) - Incomplete quadriplegia at C5-C8 level (HCC)  THERAPY DIAG:  Muscle weakness (generalized)  Other symptoms and signs involving the nervous system  Other symptoms and signs involving the musculoskeletal system  Abnormal posture  Other lack of coordination  Rationale for Evaluation and Treatment: Rehabilitation  SUBJECTIVE:  SUBJECTIVE STATEMENT: Pt reports no acute changes since last visit. Pt tired today as she didn't sleep well last night.   From initial eval: Pt familiar to this clinic, last seen Oct-Nov 2024 but has been seen for multiple POCs since her initial injury in 2022. Pt returns to this clinic after being in [place]  for the past few months. While in [place]  in December 2024 patient had a fall where she slid forwards out of her wheelchair onto the floor, ended up fracturing both of her femurs. Pt was hospitalized for 10-11 days and had surgical repair of her femurs. Pt reports she has been cleared of all restrictions since surgery, does have ongoing swelling in both legs and worsened spasticity. Pt reports that the spasticity has slightly improved since it initially started, was told by Dr. Lovorn the swelling may not resolve for 6-8 months (it has been 3 months) and not sure if spasticity will resolve but patient is hopeful that if her swelling improves her spasticity will  improve as well.  Pt asking about other options to help manage the swelling her legs, has tried variable compression stockings (knee high) and her PTs in [place]  recommended tight shapewear. Pt reports that the knee-high compression stockings led to increased swelling in her knees and the shapewear did not help her swelling. Encouraged patient to get thigh-high compression stockings and elevate her LE in PWC. Pt has ordered an articulating bed that will get here next Monday (3/31) so she can better elevate her legs when in bed.  Pt is also concerned about decreased ROM in her legs, [name] works on stretching her legs frequently but she feels she has more motion in her LLE as compared to RLE and may have mild foot drop on her R side. Pt does have PRAFOs to wear at night. Pt has worked up to standing in her standing frame x 30-40 min at a time. Pt also worked on standing with her PT and in // bars in [place] . Pt also reports with her injuries she lost the ability to lock/unlock her R knee but that it is getting better. She reports she lost a lot of stamina during her hospital stay as well.  Pt also has a new wound since last seen in this clinic in her L gluteal fold, shearing injury. Pt was seeing wound care in [place]  and is scheduled to see wound care with [facility] early April (was not able to schedule with [facility] wound care until late April).  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 Yes: NPRS scale: 3-4 Pain location: elbows to fingertips on both arms Pain description: nerve pain Aggravating factors: not stated Relieving factors: not stated  PRECAUTIONS: Fall and Other: osteoporosis  RED FLAGS: None   WEIGHT BEARING RESTRICTIONS: No  FALLS: Has patient fallen in last 6 months? Yes. Number of falls 1 fall in [place]  that resulted in B femur fractures  LIVING  ENVIRONMENT: Lives with: lives with their spouse and and with full-time caregiver [name] Lives in: House/apartment Home is power wheelchair accessible Has following equipment at home: Wheelchair (power), Wheelchair (manual), Grab bars, Ramped entry, and standing frame, slide board  PLOF: Independent with household mobility with device, Independent with community mobility with device, Requires assistive device for independence, Needs assistance with ADLs, and Needs assistance with transfers  PATIENT GOALS: "still working on stand  and pivot with goal to pivot to commode or to a chair" "work on core-will help me with standing" "improve my stamina - being in the hospital I lost strength/endurance"   OBJECTIVE:  Note: Objective measures were completed at Evaluation unless otherwise noted.  DIAGNOSTIC FINDINGS: None update/relevant to this POC  COGNITION: Overall cognitive status: Within functional limits for tasks assessed   SENSATION: Decreased sensation in BUE and BLE secondary to incomplete quadriplegia Decreased sensation in proximal LLE as compared to distal LE  EDEMA:  Circumferential: R knee: 17"; L knee: 17.5" and Figure 8: R ankle 21", L ankle 21.5"  MUSCLE TONE: increased spasticity in BLE   POSTURE: rounded shoulders and forward head  LOWER EXTREMITY ROM:     Passive  Right Eval Left Eval  Hip flexion Tight hip flexors Tight hip flexors  Hip extension    Hip abduction    Hip adduction    Hip internal rotation    Hip external rotation    Knee flexion Tight HS Tight HS  Knee extension    Ankle dorsiflexion Decreased, tight gastroc Decreased, tight gastroc  Ankle plantarflexion    Ankle inversion    Ankle eversion     (Blank rows = not tested)  LOWER EXTREMITY MMT:    MMT Right Eval Left Eval  Hip flexion 1 2-  Hip extension    Hip abduction    Hip adduction    Hip internal rotation    Hip external rotation    Knee flexion 0 3  Knee extension 2- 2-   Ankle dorsiflexion 2- 3  Ankle plantarflexion    Ankle inversion    Ankle eversion    (Blank rows = not tested)  BED MOBILITY:  From previous POC: Sit to supine Mod A Supine to sit Mod A Rolling to Right Mod A Rolling to Left Mod A Undulating mattress for wound management on standard bed (elevated-so often doing uphill sliding board transfers); she would like to continue working on sitting up independently, she has been working on rolling, needs less assistance w/ this when someone props her leg into hooklying; would like something to help her pull her left leg to her butt for stretching as well as bed mobility.  TRANSFERS: From previous POC: Pt continues using combination of bump over, slide board, and depression (squat pivot) transfers.                                                                                                                              TREATMENT:   NMR Pt received seated in her power wheelchair. Attempted working on standing in // bars, 2 attempts to stand with max A and knees blocked. Pt unable to maintain upright stance or get to standing all the way due to fatigue.  Transitioned to work on [employer]. Pt performs SB transfers PWC to/from mat table with min A from [place] (caregiver).  Seated balance/core stabilization performing alt UE  reaching outside BOS and across midline for targets with CGA to min A for trunk control: LUE with tan weighted dowel x 10 hits each 24.8 sec 23.56 sec 23.8 sec RUE with yellow weighted dowel x 10 hits each 46.86 sec 34 sec (hybrid LUE/RUE due to fatigue of R hand/grip)    PATIENT EDUCATION: Education details: Continue PROM at home.  Person educated: Patient and Arts administrator Education method: Explanation Education comprehension: verbalized understanding  HOME EXERCISE PROGRAM: Will be established as needed as pt has done continuous therapy and is working towards functional tasks.   GOALS: Goals reviewed with  patient? Yes  SHORT TERM GOALS: Target date: 10/07/2023  HEP to be established for stretching and strengthening as appropriate. Baseline: not established at initial eval, reviewed during PT POC - pt and [name] report it is going well at home (4/25) Goal status: MET  2.  Pt will perform sit to stand transfer with LRAD with mod A Baseline: max A to stedy (4/4), max A in // bars (4/25) Goal status: IN PROGRESS  3.  Pt to tolerate standing x 5 min with LRAD to demonstrate improved endurance Baseline: 30 sec in stedy (initial), 2:35 min in // bars (4/25) Goal status: IN PROGRESS  4.  Pt to demonstrate reduced edema in BLE with a reduction in circumference/figure 8 measurement by 0.5" from initial evaluation. Baseline: Circumferential: R knee: 17"; L knee: 17.5" and Figure 8: R ankle 21", L ankle 21.5" 4/25: R knee 16", L knee: 16 11/16", R ankle: 21", L ankle 20" Goal status: IN PROGRESS   LONG TERM GOALS: Target date: 11/04/2023   Patient and her caregiver to be independent with performance of HEP for stretching and strengthening as appropriate. Baseline: not established at initial eval, reviewed during PT POC - pt and [name] report it is going well at home (4/25) Goal status: INITIAL  2.  Pt will perform sit to stand transfer with LRAD with min A Baseline: max A to stedy (4/4), max A in // bars (4/25) Goal status: INITIAL  3.  Pt to tolerate standing x 5 min with LRAD to demonstrate improved endurance Baseline: 30 sec in stedy (initial), 2:35 min in // bars (4/25) Goal status: REVISED  4.  Pt to demonstrate reduced edema in BLE with a reduction in circumference/figure 8 measurement by 1" from initial evaluation. Baseline: Circumferential: R knee: 17"; L knee: 17.5" and Figure 8: R ankle 21", L ankle 21.5" 4/25: R knee 16", L knee: 16 11/16", R ankle: 21", L ankle 20" Goal status: INITIAL    ASSESSMENT:  CLINICAL IMPRESSION: Session limited by patient's fatigue. Emphasis of  skilled PT session on attempting to work on standing with transition to work on short-sitting balance, core stability, and reaching outside BOS and across midline with her UE. She is limited by R hand strength this date and difficulty gripping device to perform reaching. She continues to benefit from skilled PT services to work towards increased safety and independence with functional transfers and sitting balance. Continue POC.   OBJECTIVE IMPAIRMENTS: decreased balance, decreased endurance, decreased mobility, difficulty walking, decreased ROM, decreased strength, increased edema, impaired perceived functional ability, increased muscle spasms, impaired flexibility, impaired sensation, impaired tone, impaired UE functional use, postural dysfunction, and pain.   ACTIVITY LIMITATIONS: carrying, lifting, bending, standing, stairs, transfers, bed mobility, continence, bathing, toileting, dressing, reach over head, and hygiene/grooming  PARTICIPATION LIMITATIONS: meal prep, cleaning, laundry, driving, shopping, community activity, and occupation  PERSONAL FACTORS: Age, Sex, Time  since onset of injury/illness/exacerbation, and 1-2 comorbidities:   C7 ASIA C- incomplete quad w/ neurogenic bladder and bowel, HLD, Hx of skin cancerare also affecting patient's functional outcome.   REHAB POTENTIAL: Good  CLINICAL DECISION MAKING: Stable/uncomplicated  EVALUATION COMPLEXITY: High  PLAN:  PT FREQUENCY: 2x/week  PT DURATION: 8 weeks  PLANNED INTERVENTIONS: 97164- PT Re-evaluation, 97110-Therapeutic exercises, 97530- Therapeutic activity, 97112- Neuromuscular re-education, 97535- Self Care, 16109- Manual therapy, [phone]- Gait training, [phone]- Orthotic Fit/training, [phone]- Electrical stimulation (manual), Patient/Family education, Balance training, Stair training, Taping, Dry Needling, Joint mobilization, Scar mobilization, Compression bandaging, DME instructions, Wheelchair mobility training, Cryotherapy,  and Moist heat  PLAN FOR NEXT SESSION: any questions over PROM/stretching? sit to stands, standing tolerance with stedy, in // bars, possibly with RW, core strengthening/stability, endurance; wants to have conversation about taking a break (3-6 months) after this POC near end of this POC, standing lateral weight shifts, mini-squats in standing?, work on bump transfers, assess cushions if patient brings them - TT to reach out to [place] to set this up, Yoga block press-ups vs push-up blocks, long-sitting - unsupported for core work?, bumping on slide board, standing  Already scheduled additional 2x/week for 6 weeks, will recert at 5/22 visit  Lorita Rosa, PT Lorita Rosa, PT, DPT, CSRS    10/28/2023, 11:00 AM

## 2023-11-01 ENCOUNTER — Ambulatory Visit: Admitting: Physical Therapy

## 2023-11-01 ENCOUNTER — Ambulatory Visit: Admitting: Occupational Therapy

## 2023-11-01 DIAGNOSIS — R29898 Other symptoms and signs involving the musculoskeletal system: Secondary | ICD-10-CM

## 2023-11-01 DIAGNOSIS — M25641 Stiffness of right hand, not elsewhere classified: Secondary | ICD-10-CM

## 2023-11-01 DIAGNOSIS — R29818 Other symptoms and signs involving the nervous system: Secondary | ICD-10-CM

## 2023-11-01 DIAGNOSIS — R278 Other lack of coordination: Secondary | ICD-10-CM | POA: Diagnosis not present

## 2023-11-01 DIAGNOSIS — M6281 Muscle weakness (generalized): Secondary | ICD-10-CM

## 2023-11-01 DIAGNOSIS — R2689 Other abnormalities of gait and mobility: Secondary | ICD-10-CM | POA: Diagnosis not present

## 2023-11-01 DIAGNOSIS — M24542 Contracture, left hand: Secondary | ICD-10-CM

## 2023-11-01 DIAGNOSIS — G8253 Quadriplegia, C5-C7 complete: Secondary | ICD-10-CM

## 2023-11-01 DIAGNOSIS — R293 Abnormal posture: Secondary | ICD-10-CM | POA: Diagnosis not present

## 2023-11-01 DIAGNOSIS — R208 Other disturbances of skin sensation: Secondary | ICD-10-CM | POA: Diagnosis not present

## 2023-11-01 DIAGNOSIS — M24541 Contracture, right hand: Secondary | ICD-10-CM

## 2023-11-01 NOTE — Therapy (Signed)
 OUTPATIENT OCCUPATIONAL THERAPY NEURO TREATMENT   Patient Name: [name] MRN: [id number] DOB:1951-02-02, 73 y.o., female Today's Date: 11/01/2023  PCP: Reginal Capra, MD  REFERRING PROVIDER: Celia Coles, MD  END OF SESSION:  OT End of Session - 11/01/23 1105     Visit Number 14    Number of Visits 17    Date for OT Re-Evaluation 11/04/23    Authorization Type [employer] - auth approved    Authorization Time Period 09/09/2023 - 11/04/2023    OT Start Time 1104    OT Stop Time 1145    OT Time Calculation (min) 41 min    Equipment Utilized During Treatment Personal computer    Activity Tolerance Patient tolerated treatment well    Behavior During Therapy [facility] for tasks assessed/performed            Past Medical History:  Diagnosis Date   CERVICAL POLYP 03/11/2008   Qualifier: Diagnosis of  By: Ethel Henry MD, Joaquim Muir    Colon polyps 2005   on colonscopy Dr. Sandrea Cruel   Fibroid 2004   Per Dr. Alva Jewels   History of shingles    face and mouth   Hx of skin cancer, basal cell    Rosacea    Sciatica of left side 09/28/2013   Scoliosis    noted on mri done for back pain   Past Surgical History:  Procedure Laterality Date   BUNIONECTOMY     Patient Active Problem List   Diagnosis Date Noted   Buttock wound, left, subsequent encounter 03/16/2023   Bronchiectasis with acute exacerbation (HCC) 03/15/2023   Buttock wound, left, initial encounter 11/02/2022   Orthostatic hypotension 08/13/2022   Neurogenic bowel 05/03/2022   Spasticity 05/03/2022   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/03/2022   Nerve pain 05/03/2022   Medication monitoring encounter 01/08/2022   Neurogenic bladder 10/11/2021   Urinary incontinence 10/11/2021   ESBL (extended spectrum beta-lactamase) producing bacteria infection 10/09/2021   Recurrent UTI 10/09/2021   Quadriplegia, C5-C7 incomplete (HCC) 01/16/2021   History of spinal fracture 01/16/2021   Suprapubic catheter (HCC) 01/16/2021   Encounter  for routine gynecological examination 09/28/2013   Onychomycosis 09/28/2013   Foot deformity, acquired 03/26/2012   Encounter for preventive health examination 12/25/2010   ROSACEA 08/25/2009   Disturbance in sleep behavior 03/11/2008   SKIN CANCER, HX OF 03/11/2008   DYSURIA, HX OF 03/11/2008   Hyperlipidemia 02/10/2007   CERVICALGIA 02/10/2007    ONSET DATE: 07/28/2020  Date of Referral 08/29/2023   REFERRING DIAG: G82.54 (ICD-10-CM) - Quadriplegia, C5-C8, incomplete  THERAPY DIAG:  Muscle weakness (generalized)  Other symptoms and signs involving the nervous system  Other symptoms and signs involving the musculoskeletal system  Other lack of coordination  Contracture of hand joint, left  Contracture of hand joint, right  Stiffness of right hand, not elsewhere classified  Other disturbances of skin sensation  Quadriplegia, C5-C7 complete (HCC)  Rationale for Evaluation and Treatment: Rehabilitation  SUBJECTIVE:   SUBJECTIVE STATEMENT: Pt states she realizes her PIPs and DIPs do not bend until end of range.    Pt accompanied by: Caregiver, [name]   PERTINENT HISTORY: "Pt is a 73 yr old L handed female with hx of incomplete quadriplegia- 2/14 2022- fleeing the police in [place] on passenger 100 (high speed) miles/hour,  [name]; neurogenic bowel and bladder and spasticity; [name], [name] Here for f/u on Incomplete quadriplegia"  B femur fractures December, 2024.  PRECAUTIONS: Fall; suprapubic  catheter (she wants to get this removed meaning she needs to get to and from the toilet); she has had minor heat sensation when needing to complete her bowel program-possible AD?   WEIGHT BEARING RESTRICTIONS: No  PAIN: - reports average pain as noted below. Will notify therapist if there are changes in her pain.  Are you having pain? Yes: NPRS scale: 3/10 Pain location: fingers to elbow bilaterally Pain description: constant Aggravating factors: it  can increased over time ie) is worse at the end of the day.  Also cold affects cramps and function. Relieving factors: gabapentin  and baclofen  for spasms, nightly stretching  FALLS: Has patient fallen in last 6 months? Yes. Number of falls 1  LIVING ENVIRONMENT: Lives with: lives with their family - husband [name] and with an adult companion s/p moving back up from [place]  x10 months Lives in: House/apartment Stairs: 4 story town house with an Engineer, structural with threshold adjustments, roll in shower with transport chair Has following equipment at home: Wheelchair (power) - with seat height adjustments to access counters and reclining option, Wheelchair (manual), transport WC, shower chair, and Ramped entry, handheld showerhead with rails around toilet, had Alen Amy but is no longer in need of it, has slide boards x3  PLOF: Requires assistive device for independence, Needs assistance with ADLs, Needs assistance with homemaking, Needs assistance with gait, and Needs assistance with transfers; 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  Used to like to knit, sew and bake.  PATIENT GOALS: improve spasticity and use of hands  OBJECTIVE:   HAND DOMINANCE: Left  ADLs: Overall ADLs: Patient has a live in caregiver  Transfers/ambulation related to ADLs: min assist with sliding board transfers.  Eating: Has a rocker knife that she can use. Used to use adapted utensils but now uses regular utensils but still will get assistance to cut food ie) when eating out.  Grooming: can brush her own hair with LUE only; unable to manage jewelry ie) earrings  UB Dressing: can zip/unzip after it has been started, unable to manage buttons herself, Caregiver assists but if she has extra time, she can put on her bra, and a loose fitting pullover shirt/t-shirt  LB Dressing: dependent for LB dressing in bed and with special sock donner for LE compression garments   Toileting: bladder trained with suprapubic  catheter which she clamps off.  Dependent for bowel incontinence care.  Bathing: Sponge bath with adult washclothes.  Can bathe UB with back scrubber for most of her back.  Needs help with feet (mentioned she might need a separate brush for feet)   Tub Shower transfers: Min assist with slide board to wheel in shower chair  Equipment: Shower seat with back, Walk in shower, bed side commode, Reacher, Sock aid, Long handled sponge, and Feeding equipment  IADLs: --  Shopping: Assisted by caregiver  Light housekeeping: Has housekeeper that comes monthly  Meal Prep: previously enjoyed baking. Assisted by caregiver but has reheated a meal for herself after getting food out of the fridge/freezer from her WC.  Community mobility: Dependent  Medication management: Caregiver sorts them into pillbox but she is very aware of her medications   Financial management: Patient manages her own finances  Handwriting: Increased time and has a pen with a little grip  MOBILITY STATUS: Independent with power mobility  ACTIVITY TOLERANCE: Activity tolerance: good to Fair - MMT WFL but has limited sustained tolerance for ongoing use of Ues with poor trunk control  FUNCTIONAL OUTCOME  MEASURES:  PSFS: 3.3 total score  10/12/2023: 3.7 total score   Total score = sum of the activity scores/number of activities Minimum detectable change (90%CI) for average score = 2 points Minimum detectable change (90%CI) for single activity score = 3 points   UPPER EXTREMITY ROM:   AROM - WFL without obvious contractures, some digital flexion noted but PROM WNL   UPPER EXTREMITY MMT:   Grossly WFL - Endurance limited R tricep strength > than L but L UE generally stronger than R UE  MMT Right (eval) Left (eval)  Shoulder flexion 4/5 4/5  Shoulder abduction 4/5 4/5  Elbow flexion 4/5 4/5  Elbow extension 4/5 4/5  (Blank rows = not tested)  HAND FUNCTION: Grip strength: Right: 4.4 lbs (decline) ; Left: 18 lbs  (slight improvement)  COORDINATION: 09/29/23 s/p Botox  injections yesterday  Left: 56.13 sec Right 3:39.58 min  SENSATION: Light touch: Impaired  - patient   EDEMA: NA for UEs but LE has poor lymph drainage with custom compression garments   MUSCLE TONE: Generally WFL   COGNITION: Overall cognitive status: Within functional limits for tasks assessed  VISION: Subjective report: Patent wears progressive lens/glasses.  Denies diplopia or vision changes. Baseline vision: Wears glasses all the time  VISION ASSESSMENT: WFL  OBSERVATIONS: Patient independent with power WC navigation within clinic.  Patient is well-kept with foley catheter in place.  She has slight limitations in full extension of digits but PROM is WNL.    TODAY'S TREATMENT:                                                                                            Therapeutic Activities Explored typing and computer access issues through various options including use of external keyboard (without track pad) to allow for better positioning of the hands and potential use of ergonomic mouse.  Therapeutic Exercises Pt engaged in lumbrical strengthening including isometric table edge holds, putty pulls, and sock puppet motions.  OT educated pt on MCP extension and blocking at neutral to focus on flexion of DIP and PIPs. Fatigue noted with completion. Caregiver able to complete with pt.   PATIENT EDUCATION: Education details: UE digital ROM activities; Computer modifications Person educated: Patient and Caregiver - Programmer, systems Education method: Explanation, Demonstration, and Verbal cues Education comprehension: verbalized understanding, returned demonstration, verbal cues required, and needs further education  HOME EXERCISE PROGRAM: Previously issued HEP per DC 12/02/22: All previous HEPs combined to 1 complete List through [employer] Access Code: [email or web address] 10/10/2023: Alternative [employer] application guidance  GOALS:   SHORT TERM  GOALS: Target date: 10/07/2023   1. Patient will verbalize understanding of AE/modified techniques to improve independence and safety with ADL and IADL completion. Baseline: Caregiver/spouse assist Goal status: IN PROGRESS  2.  Pt will be independent with BUE braces/splints as needed to prevent contracture and improve functional use of hands.  Baseline: Caregiver/spouse assist Goal status: MET  LONG TERM GOALS: Target date: 11/04/2023  Patient will demonstrate independence with updated HEP for UE strengthening, coordination and ROM to prevent contractures and maintain strength for transfers and ADLs. Baseline: Previous HEPs have been established but  need to be reviewed and updated.  Goal status: IN Progress  2.  Patient will report at least two-point increase in average PSFS score or at least three-point increase in a single activity score indicating functionally significant improvement given minimum detectable change. Baseline: 3.3 total score (See above for individual activity scores)  Goal status: IN Progress  3.  Patient will demonstrate at least 10 lbs R grip strength as needed to open jars and other containers. Baseline: 4.4 lbs 09/28/23: Botox  injections  10/12/2023: R - 1.7 lbs; L - 4.1, 7.4 lbs Goal status: IN PROGRESS  ASSESSMENT:  CLINICAL IMPRESSION: Patient demonstrates good understanding of computer adaption options to help with ease of typing.  She will benefit from trialed use with keyboard during upcoming visits to maximize positioning and use as needed to improve typing capabilities.   PERFORMANCE DEFICITS: in functional skills including ADLs, IADLs, coordination, dexterity, strength, muscle spasms, Fine motor control, Gross motor control, continence, skin integrity, and UE functional use,   IMPAIRMENTS: are limiting patient from ADLs, IADLs, work, and leisure.   CO-MORBIDITIES: has co-morbidities such as incontinence and wound that affects occupational performance.  Patient will benefit from skilled OT to address above impairments and improve overall function.  REHAB POTENTIAL: Fair due to chronicity of injury  PLAN:  OT FREQUENCY: 1-2x/week (pt agreeable to starting off at 2xweek then tapering with the idea that she will take a break from therapy following this therapy episode.)  OT DURATION: 8 weeks  PLANNED INTERVENTIONS: self care/ADL training, therapeutic exercise, therapeutic activity, neuromuscular re-education, manual therapy, passive range of motion, balance training, functional mobility training, splinting, patient/family education, energy conservation, coping strategies training, and DME and/or AE instructions  RECOMMENDED OTHER SERVICES: Patient was seen for PT evaluation today with treatment plans coordinated for 2x/week.  CONSULTED AND AGREED WITH PLAN OF CARE: Patient and family member/caregiver  PLAN FOR NEXT SESSION: [employer]?  Wireless keyboard from [employer] plugged into therapist's laptop  ASSESS need to change POC NMES review second unit reciprocal option  Check splints PRN Review/progress HEPs Explore FM tasks and establish weight shifting instruction for pressure relief.  Cutting, drink opening, hair  other ADL AE (jar opener)  Altamease Asters, OT 11/01/2023, 12:59 PM

## 2023-11-01 NOTE — Therapy (Signed)
 OUTPATIENT PHYSICAL THERAPY NEURO TREATMENT   Patient Name: [name] MRN: [id number] DOB:[birth year], 73 y.o., female Today's Date: 11/01/2023   PCP: Reginal Capra, MD REFERRING PROVIDER: Celia Coles, MD  END OF SESSION:  PT End of Session - 11/01/23 1021     Visit Number 15    Number of Visits 17   with eval   Date for PT Re-Evaluation 11/18/23   to allow for scheduling delays   Authorization Type [employer] MEDICARE    PT Start Time 1020    PT Stop Time 1100    PT Time Calculation (min) 40 min    Equipment Utilized During Treatment Gait belt    Activity Tolerance Patient tolerated treatment well    Behavior During Therapy WFL for tasks assessed/performed                       Past Medical History:  Diagnosis Date   CERVICAL POLYP 03/11/2008   Qualifier: Diagnosis of  By: Ethel Henry MD, Joaquim Muir    Colon polyps 2005   on colonscopy Dr. Sandrea Cruel   Fibroid 2004   Per Dr. Alva Jewels   History of shingles    face and mouth   Hx of skin cancer, basal cell    Rosacea    Sciatica of left side 09/28/2013   Scoliosis    noted on mri done for back pain   Past Surgical History:  Procedure Laterality Date   BUNIONECTOMY     Patient Active Problem List   Diagnosis Date Noted   Buttock wound, left, subsequent encounter 03/16/2023   Bronchiectasis with acute exacerbation (HCC) 03/15/2023   Buttock wound, left, initial encounter 11/02/2022   Orthostatic hypotension 08/13/2022   Neurogenic bowel 05/03/2022   Spasticity 05/03/2022   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/03/2022   Nerve pain 05/03/2022   Medication monitoring encounter 01/08/2022   Neurogenic bladder 10/11/2021   Urinary incontinence 10/11/2021   ESBL (extended spectrum beta-lactamase) producing bacteria infection 10/09/2021   Recurrent UTI 10/09/2021   Quadriplegia, C5-C7 incomplete (HCC) 01/16/2021   History of spinal fracture 01/16/2021   Suprapubic catheter (HCC) 01/16/2021   Encounter for routine  gynecological examination 09/28/2013   Onychomycosis 09/28/2013   Foot deformity, acquired 03/26/2012   Encounter for preventive health examination 12/25/2010   ROSACEA 08/25/2009   Disturbance in sleep behavior 03/11/2008   SKIN CANCER, HX OF 03/11/2008   DYSURIA, HX OF 03/11/2008   Hyperlipidemia 02/10/2007   CERVICALGIA 02/10/2007    ONSET DATE: 08/29/2023 (referral date)  REFERRING DIAG: G82.54 (ICD-10-CM) - Incomplete quadriplegia at C5-C8 level (HCC)  THERAPY DIAG:  Muscle weakness (generalized)  Other symptoms and signs involving the nervous system  Other symptoms and signs involving the musculoskeletal system  Abnormal posture  Rationale for Evaluation and Treatment: Rehabilitation  SUBJECTIVE:  SUBJECTIVE STATEMENT: Pt reports no acute changes since last visit.  Pt with ongoing skin breakdown on her L ischium and L gluteal fold per [name].   From initial eval: Pt familiar to this clinic, last seen Oct-Nov 2024 but has been seen for multiple POCs since her initial injury in 2022. Pt returns to this clinic after being in [place]  for the past few months. While in [place]  in December 2024 patient had a fall where she slid forwards out of her wheelchair onto the floor, ended up fracturing both of her femurs. Pt was hospitalized for 10-11 days and had surgical repair of her femurs. Pt reports she has been cleared of all restrictions since surgery, does have ongoing swelling in both legs and worsened spasticity. Pt reports that the spasticity has slightly improved since it initially started, was told by Dr. Lovorn the swelling may not resolve for 6-8 months (it has been 3 months) and not sure if spasticity will resolve but patient is hopeful that if her swelling improves her spasticity will improve  as well.  Pt asking about other options to help manage the swelling her legs, has tried variable compression stockings (knee high) and her PTs in [place]  recommended tight shapewear. Pt reports that the knee-high compression stockings led to increased swelling in her knees and the shapewear did not help her swelling. Encouraged patient to get thigh-high compression stockings and elevate her LE in PWC. Pt has ordered an articulating bed that will get here next Monday (3/31) so she can better elevate her legs when in bed.  Pt is also concerned about decreased ROM in her legs, [name] works on stretching her legs frequently but she feels she has more motion in her LLE as compared to RLE and may have mild foot drop on her R side. Pt does have PRAFOs to wear at night. Pt has worked up to standing in her standing frame x 30-40 min at a time. Pt also worked on standing with her PT and in // bars in [place] . Pt also reports with her injuries she lost the ability to lock/unlock her R knee but that it is getting better. She reports she lost a lot of stamina during her hospital stay as well.  Pt also has a new wound since last seen in this clinic in her L gluteal fold, shearing injury. Pt was seeing wound care in [place]  and is scheduled to see wound care with [facility] early April (was not able to schedule with [facility] wound care until late April).  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 Yes: NPRS scale: 3-4 Pain location: elbows to fingertips on both arms Pain description: nerve pain Aggravating factors: not stated Relieving factors: not stated  PRECAUTIONS: Fall and Other: osteoporosis  RED FLAGS: None   WEIGHT BEARING RESTRICTIONS: No  FALLS: Has patient fallen in last 6 months? Yes. Number of falls 1 fall in [place]  that resulted in B femur fractures  LIVING  ENVIRONMENT: Lives with: lives with their spouse and and with full-time caregiver [name] Lives in: House/apartment Home is power wheelchair accessible Has following equipment at home: Wheelchair (power), Wheelchair (manual), Grab bars, Ramped entry, and standing frame, slide board  PLOF: Independent with household mobility with device, Independent with community mobility with device, Requires assistive device for independence, Needs assistance with ADLs, and Needs assistance with transfers  PATIENT GOALS: "still working on stand and pivot with goal to pivot to commode or to a chair" "work on core-will help me with standing" "improve my stamina - being in the hospital I lost strength/endurance"   OBJECTIVE:  Note: Objective measures were completed at Evaluation unless otherwise noted.  DIAGNOSTIC FINDINGS: None update/relevant to this POC  COGNITION: Overall cognitive status: Within functional limits for tasks assessed   SENSATION: Decreased sensation in BUE and BLE secondary to incomplete quadriplegia Decreased sensation in proximal LLE as compared to distal LE  EDEMA:  Circumferential: R knee: 17"; L knee: 17.5" and Figure 8: R ankle 21", L ankle 21.5"  MUSCLE TONE: increased spasticity in BLE   POSTURE: rounded shoulders and forward head  LOWER EXTREMITY ROM:     Passive  Right Eval Left Eval  Hip flexion Tight hip flexors Tight hip flexors  Hip extension    Hip abduction    Hip adduction    Hip internal rotation    Hip external rotation    Knee flexion Tight HS Tight HS  Knee extension    Ankle dorsiflexion Decreased, tight gastroc Decreased, tight gastroc  Ankle plantarflexion    Ankle inversion    Ankle eversion     (Blank rows = not tested)  LOWER EXTREMITY MMT:    MMT Right Eval Left Eval  Hip flexion 1 2-  Hip extension    Hip abduction    Hip adduction    Hip internal rotation    Hip external rotation    Knee flexion 0 3  Knee extension 2- 2-   Ankle dorsiflexion 2- 3  Ankle plantarflexion    Ankle inversion    Ankle eversion    (Blank rows = not tested)  BED MOBILITY:  From previous POC: Sit to supine Mod A Supine to sit Mod A Rolling to Right Mod A Rolling to Left Mod A Undulating mattress for wound management on standard bed (elevated-so often doing uphill sliding board transfers); she would like to continue working on sitting up independently, she has been working on rolling, needs less assistance w/ this when someone props her leg into hooklying; would like something to help her pull her left leg to her butt for stretching as well as bed mobility.  TRANSFERS: From previous POC: Pt continues using combination of bump over, slide board, and depression (squat pivot) transfers.                                                                                                                              TREATMENT:   [employer] Pt presents to PT appointment seated in her PWC with [name] (caregiver) present. Also present during session is Verena Glaser, ATP from [employer] to perform a seating evaluation/wheelchair cushion assessment.   Discussed frequency of pressure relief: Recommendation that patient be performing pressure relief every hour for at least 15 min BARE MINIMUM pressure relief needs to be performed 6 times per  day for at least 5 min each time Recommending that she perform for 10-15 min during two of those pressure relief sessions, preferably more towards the end of the day  Discussed how to properly utilize tilt/recline feature on power wheelchair (fully tilt before you recline back, recline up before un-tilting chair) in order to reduce poster pelvic tilt and sacral sitting while in power wheelchair. Neglia also reviewed how to reposition patient when she is tilted/reclined in her chair to pull her pelvis into a more neutral position.  Discussed types of cushions that patient currently has and that the gel in her  cushion needs to be repositioned every time she is doing pressure relief and that recommendation for a new cushion would be a Roho hybrid select which is a skin protectant and positioning cushion. Pt in agreement to pursue this type of cushion, will process with LMN.   PATIENT EDUCATION: Education details: see above regarding cushions, pressure relief, tilt/recline Person educated: Patient and Arts administrator Education method: Explanation Education comprehension: verbalized understanding  HOME EXERCISE PROGRAM: Will be established as needed as pt has done continuous therapy and is working towards functional tasks.   GOALS: Goals reviewed with patient? Yes  SHORT TERM GOALS: Target date: 10/07/2023  HEP to be established for stretching and strengthening as appropriate. Baseline: not established at initial eval, reviewed during PT POC - pt and [name] report it is going well at home (4/25) Goal status: MET  2.  Pt will perform sit to stand transfer with LRAD with mod A Baseline: max A to stedy (4/4), max A in // bars (4/25) Goal status: IN PROGRESS  3.  Pt to tolerate standing x 5 min with LRAD to demonstrate improved endurance Baseline: 30 sec in stedy (initial), 2:35 min in // bars (4/25) Goal status: IN PROGRESS  4.  Pt to demonstrate reduced edema in BLE with a reduction in circumference/figure 8 measurement by 0.5" from initial evaluation. Baseline: Circumferential: R knee: 17"; L knee: 17.5" and Figure 8: R ankle 21", L ankle 21.5" 4/25: R knee 16", L knee: 16 11/16", R ankle: 21", L ankle 20" Goal status: IN PROGRESS   LONG TERM GOALS: Target date: 11/04/2023   Patient and her caregiver to be independent with performance of HEP for stretching and strengthening as appropriate. Baseline: not established at initial eval, reviewed during PT POC - pt and [name] report it is going well at home (4/25) Goal status: INITIAL  2.  Pt will perform sit to stand transfer with LRAD with  min A Baseline: max A to stedy (4/4), max A in // bars (4/25) Goal status: INITIAL  3.  Pt to tolerate standing x 5 min with LRAD to demonstrate improved endurance Baseline: 30 sec in stedy (initial), 2:35 min in // bars (4/25) Goal status: REVISED  4.  Pt to demonstrate reduced edema in BLE with a reduction in circumference/figure 8 measurement by 1" from initial evaluation. Baseline: Circumferential: R knee: 17"; L knee: 17.5" and Figure 8: R ankle 21", L ankle 21.5" 4/25: R knee 16", L knee: 16 11/16", R ankle: 21", L ankle 20" Goal status: INITIAL    ASSESSMENT:  CLINICAL IMPRESSION: Emphasis of skilled PT session on performing seating evaluation and wheelchair cushion assessment with Verena Glaser, ATP. See above regarding education reviewed this session. Pt can benefit from use of a Roho hybrid select cushion for a skin protectant and to assist with positioning in her PWC. She continues to benefit from skilled PT  services to work towards increased safety and independence with functional transfers and sitting balance. Continue POC.   OBJECTIVE IMPAIRMENTS: decreased balance, decreased endurance, decreased mobility, difficulty walking, decreased ROM, decreased strength, increased edema, impaired perceived functional ability, increased muscle spasms, impaired flexibility, impaired sensation, impaired tone, impaired UE functional use, postural dysfunction, and pain.   ACTIVITY LIMITATIONS: carrying, lifting, bending, standing, stairs, transfers, bed mobility, continence, bathing, toileting, dressing, reach over head, and hygiene/grooming  PARTICIPATION LIMITATIONS: meal prep, cleaning, laundry, driving, shopping, community activity, and occupation  PERSONAL FACTORS: Age, Sex, Time since onset of injury/illness/exacerbation, and 1-2 comorbidities:   C7 ASIA C- incomplete quad w/ neurogenic bladder and bowel, HLD, Hx of skin cancerare also affecting patient's functional outcome.   REHAB  POTENTIAL: Good  CLINICAL DECISION MAKING: Stable/uncomplicated  EVALUATION COMPLEXITY: High  PLAN:  PT FREQUENCY: 2x/week  PT DURATION: 8 weeks  PLANNED INTERVENTIONS: 97164- PT Re-evaluation, 97110-Therapeutic exercises, 97530- Therapeutic activity, 97112- Neuromuscular re-education, 97535- Self Care, [id number]- Manual therapy, [phone]- Gait training, [phone]- Orthotic Fit/training, [phone]- Electrical stimulation (manual), Patient/Family education, Balance training, Stair training, Taping, Dry Needling, Joint mobilization, Scar mobilization, Compression bandaging, DME instructions, Wheelchair mobility training, Cryotherapy, and Moist heat  PLAN FOR NEXT SESSION: RECERT, any questions over PROM/stretching? sit to stands, standing tolerance with stedy, in // bars, possibly with RW, core strengthening/stability, endurance; wants to have conversation about taking a break (3-6 months) after this POC near end of this POC, standing lateral weight shifts, mini-squats in standing?, work on bump transfers, assess cushions if patient brings them - TT to reach out to [place] to set this up, Yoga block press-ups vs push-up blocks, long-sitting - unsupported for core work?, bumping on [employer], standing  Write LMN for new cushion  [employer], PT Lorita Rosa, PT, DPT, CSRS    11/01/2023, 11:03 AM

## 2023-11-03 ENCOUNTER — Ambulatory Visit: Admitting: Occupational Therapy

## 2023-11-03 ENCOUNTER — Ambulatory Visit: Admitting: Physical Therapy

## 2023-11-03 ENCOUNTER — Encounter (HOSPITAL_BASED_OUTPATIENT_CLINIC_OR_DEPARTMENT_OTHER): Admitting: Internal Medicine

## 2023-11-03 DIAGNOSIS — R208 Other disturbances of skin sensation: Secondary | ICD-10-CM

## 2023-11-03 DIAGNOSIS — S30810A Abrasion of lower back and pelvis, initial encounter: Secondary | ICD-10-CM | POA: Diagnosis not present

## 2023-11-03 DIAGNOSIS — R293 Abnormal posture: Secondary | ICD-10-CM

## 2023-11-03 DIAGNOSIS — S31829A Unspecified open wound of left buttock, initial encounter: Secondary | ICD-10-CM

## 2023-11-03 DIAGNOSIS — R29898 Other symptoms and signs involving the musculoskeletal system: Secondary | ICD-10-CM

## 2023-11-03 DIAGNOSIS — R2689 Other abnormalities of gait and mobility: Secondary | ICD-10-CM

## 2023-11-03 DIAGNOSIS — T798XXA Other early complications of trauma, initial encounter: Secondary | ICD-10-CM | POA: Diagnosis not present

## 2023-11-03 DIAGNOSIS — M25641 Stiffness of right hand, not elsewhere classified: Secondary | ICD-10-CM

## 2023-11-03 DIAGNOSIS — M6281 Muscle weakness (generalized): Secondary | ICD-10-CM | POA: Diagnosis not present

## 2023-11-03 DIAGNOSIS — M24542 Contracture, left hand: Secondary | ICD-10-CM | POA: Diagnosis not present

## 2023-11-03 DIAGNOSIS — R29818 Other symptoms and signs involving the nervous system: Secondary | ICD-10-CM

## 2023-11-03 DIAGNOSIS — R278 Other lack of coordination: Secondary | ICD-10-CM

## 2023-11-03 NOTE — Therapy (Signed)
 OUTPATIENT OCCUPATIONAL THERAPY NEURO TREATMENT   Patient Name: [name] MRN: [id number] DOB:1950-09-21, 73 y.o., female Today's Date: 11/03/2023  PCP: Reginal Capra, MD  REFERRING PROVIDER: Celia Coles, MD  END OF SESSION:  OT End of Session - 11/03/23 1023     Visit Number 15    Number of Visits 17    Date for OT Re-Evaluation 11/04/23    Authorization Type [employer] - auth approved    Authorization Time Period 09/09/2023 - 11/04/2023    OT Start Time 1020    OT Stop Time 1100    OT Time Calculation (min) 40 min    Equipment Utilized During Treatment Chain Game    Activity Tolerance Patient tolerated treatment well    Behavior During Therapy [facility] for tasks assessed/performed            Past Medical History:  Diagnosis Date   CERVICAL POLYP 03/11/2008   Qualifier: Diagnosis of  By: Ethel Henry MD, Joaquim Muir    Colon polyps 2005   on colonscopy Dr. Sandrea Cruel   Fibroid 2004   Per Dr. Alva Jewels   History of shingles    face and mouth   Hx of skin cancer, basal cell    Rosacea    Sciatica of left side 09/28/2013   Scoliosis    noted on mri done for back pain   Past Surgical History:  Procedure Laterality Date   BUNIONECTOMY     Patient Active Problem List   Diagnosis Date Noted   Buttock wound, left, subsequent encounter 03/16/2023   Bronchiectasis with acute exacerbation (HCC) 03/15/2023   Buttock wound, left, initial encounter 11/02/2022   Orthostatic hypotension 08/13/2022   Neurogenic bowel 05/03/2022   Spasticity 05/03/2022   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/03/2022   Nerve pain 05/03/2022   Medication monitoring encounter 01/08/2022   Neurogenic bladder 10/11/2021   Urinary incontinence 10/11/2021   ESBL (extended spectrum beta-lactamase) producing bacteria infection 10/09/2021   Recurrent UTI 10/09/2021   Quadriplegia, C5-C7 incomplete (HCC) 01/16/2021   History of spinal fracture 01/16/2021   Suprapubic catheter (HCC) 01/16/2021   Encounter for  routine gynecological examination 09/28/2013   Onychomycosis 09/28/2013   Foot deformity, acquired 03/26/2012   Encounter for preventive health examination 12/25/2010   ROSACEA 08/25/2009   Disturbance in sleep behavior 03/11/2008   SKIN CANCER, HX OF 03/11/2008   DYSURIA, HX OF 03/11/2008   Hyperlipidemia 02/10/2007   CERVICALGIA 02/10/2007    ONSET DATE: 07/28/2020  Date of Referral 08/29/2023   REFERRING DIAG: G82.54 (ICD-10-CM) - Quadriplegia, C5-C8, incomplete  THERAPY DIAG:  Other lack of coordination  Muscle weakness (generalized)  Other symptoms and signs involving the nervous system  Stiffness of right hand, not elsewhere classified  Other disturbances of skin sensation  Rationale for Evaluation and Treatment: Rehabilitation  SUBJECTIVE:   SUBJECTIVE STATEMENT: Pt reports difficulty with pinch and finger opposition activities.    Pt accompanied by: Caregiver, [name]   PERTINENT HISTORY: "Pt is a 73 yr old L handed female with hx of incomplete quadriplegia- 2/14 2022- fleeing the police in [place] on passenger 100 (high speed) miles/hour,  [name]; neurogenic bowel and bladder and spasticity; [name], [name] Here for f/u on Incomplete quadriplegia"  B femur fractures December, 2024.  PRECAUTIONS: Fall; suprapubic catheter (she wants to get this removed meaning she needs to get to and from the toilet); she has had minor heat sensation when needing to complete her bowel program-possible AD?  WEIGHT BEARING RESTRICTIONS: No  PAIN: - reports average pain as noted below. Will notify therapist if there are changes in her pain.  Are you having pain? Yes: NPRS scale: 3/10 Pain location: fingers to elbow bilaterally Pain description: constant Aggravating factors: it can increased over time ie) is worse at the end of the day.  Also cold affects cramps and function. Relieving factors: gabapentin  and baclofen  for spasms, nightly stretching  FALLS:  Has patient fallen in last 6 months? Yes. Number of falls 1  LIVING ENVIRONMENT: Lives with: lives with their family - husband [name] and with an adult companion s/p moving back up from [place]  x10 months Lives in: House/apartment Stairs: 4 story town house with an Engineer, structural with threshold adjustments, roll in shower with transport chair Has following equipment at home: Wheelchair (power) - with seat height adjustments to access counters and reclining option, Wheelchair (manual), transport WC, shower chair, and Ramped entry, handheld showerhead with rails around toilet, had Alen Amy but is no longer in need of it, has slide boards x3  PLOF: Requires assistive device for independence, Needs assistance with ADLs, Needs assistance with homemaking, Needs assistance with gait, and Needs assistance with transfers; 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  Used to like to knit, sew and bake.  PATIENT GOALS: improve spasticity and use of hands  OBJECTIVE:   HAND DOMINANCE: Left  ADLs: Overall ADLs: Patient has a live in caregiver  Transfers/ambulation related to ADLs: min assist with sliding board transfers.  Eating: Has a rocker knife that she can use. Used to use adapted utensils but now uses regular utensils but still will get assistance to cut food ie) when eating out.  Grooming: can brush her own hair with LUE only; unable to manage jewelry ie) earrings  UB Dressing: can zip/unzip after it has been started, unable to manage buttons herself, Caregiver assists but if she has extra time, she can put on her bra, and a loose fitting pullover shirt/t-shirt  LB Dressing: dependent for LB dressing in bed and with special sock donner for LE compression garments   Toileting: bladder trained with suprapubic catheter which she clamps off.  Dependent for bowel incontinence care.  Bathing: Sponge bath with adult washclothes.  Can bathe UB with back scrubber for most of her back.  Needs help  with feet (mentioned she might need a separate brush for feet)   Tub Shower transfers: Min assist with slide board to wheel in shower chair  Equipment: Shower seat with back, Walk in shower, bed side commode, Reacher, Sock aid, Long handled sponge, and Feeding equipment  IADLs: --  Shopping: Assisted by caregiver  Light housekeeping: Has housekeeper that comes monthly  Meal Prep: previously enjoyed baking. Assisted by caregiver but has reheated a meal for herself after getting food out of the fridge/freezer from her WC.  Community mobility: Dependent  Medication management: Caregiver sorts them into pillbox but she is very aware of her medications   Financial management: Patient manages her own finances  Handwriting: Increased time and has a pen with a little grip  MOBILITY STATUS: Independent with power mobility  ACTIVITY TOLERANCE: Activity tolerance: good to Fair - MMT WFL but has limited sustained tolerance for ongoing use of Ues with poor trunk control  FUNCTIONAL OUTCOME MEASURES:  PSFS: 3.3 total score  10/12/2023: 3.7 total score   Total score = sum of the activity scores/number of activities Minimum detectable change (90%CI) for average score = 2 points  Minimum detectable change (90%CI) for single activity score = 3 points   UPPER EXTREMITY ROM:   AROM - WFL without obvious contractures, some digital flexion noted but PROM WNL   UPPER EXTREMITY MMT:   Grossly WFL - Endurance limited R tricep strength > than L but L UE generally stronger than R UE  MMT Right (eval) Left (eval)  Shoulder flexion 4/5 4/5  Shoulder abduction 4/5 4/5  Elbow flexion 4/5 4/5  Elbow extension 4/5 4/5  (Blank rows = not tested)  HAND FUNCTION: Grip strength: Right: 4.4 lbs (decline) ; Left: 18 lbs (slight improvement)  COORDINATION: 09/29/23 s/p Botox  injections yesterday  Left: 56.13 sec Right 3:39.58 min  SENSATION: Light touch: Impaired  - patient   EDEMA: NA for UEs but  LE has poor lymph drainage with custom compression garments   MUSCLE TONE: Generally WFL   COGNITION: Overall cognitive status: Within functional limits for tasks assessed  VISION: Subjective report: Patent wears progressive lens/glasses.  Denies diplopia or vision changes. Baseline vision: Wears glasses all the time  VISION ASSESSMENT: WFL  OBSERVATIONS: Patient independent with power WC navigation within clinic.  Patient is well-kept with foley catheter in place.  She has slight limitations in full extension of digits but PROM is WNL.    TODAY'S TREATMENT:                                                                                            Therapeutic Activities Pt participated in modified Chain game activity using BUE for finger opposition, pinch strength and eye-hand coordination.  Task required pt to pinch a rubber band with both hands and stretch it over 3 pegs with multiple elastics used to create triangles to 'capture' spaces.  Upon completion of multiple triangles she was engaged in placing a game piece inside the completed triangles. Pt had most physical difficulty with coordination of RUE pinch, often using curved index finger to hold and stretch the elastic.  Increased difficulties with managing lightweight game pieces requiring a specific orientation to be placed in the board with eventual success with use of both hands together ie) R fingers stabilized base of game piece and L hand oriented it. Pt able to place 15 rubber bands and game pieces with some compensatory motions/positions but no physical assistance to get them in place.  The only modification to remove the rubber bands was to raise them slightly on the game pegs to allow her to get her fingers around them better. Suggestions provided on using putty to work on pinch motion and strength with pt verbalizing understanding.   PATIENT EDUCATION: Education details: Chief Executive Officer activities Person educated: Patient and  Caregiver - Programmer, systems Education method: Explanation, Demonstration, and Verbal cues Education comprehension: verbalized understanding, returned demonstration, verbal cues required, and needs further education  HOME EXERCISE PROGRAM: Previously issued HEP per DC 12/02/22: All previous HEPs combined to 1 complete List through [employer] Access Code: [email or web address] 10/10/2023: Alternative [employer] application guidance  GOALS:   SHORT TERM GOALS: Target date: 10/07/2023   1. Patient will verbalize understanding of AE/modified techniques to improve independence and safety with ADL and  IADL completion. Baseline: Caregiver/spouse assist Goal status: IN PROGRESS  2.  Pt will be independent with BUE braces/splints as needed to prevent contracture and improve functional use of hands.  Baseline: Caregiver/spouse assist Goal status: MET  LONG TERM GOALS: Target date: 11/04/2023  Patient will demonstrate independence with updated HEP for UE strengthening, coordination and ROM to prevent contractures and maintain strength for transfers and ADLs. Baseline: Previous HEPs have been established but need to be reviewed and updated.  Goal status: IN Progress  2.  Patient will report at least two-point increase in average PSFS score or at least three-point increase in a single activity score indicating functionally significant improvement given minimum detectable change. Baseline: 3.3 total score (See above for individual activity scores)  Goal status: IN Progress  3.  Patient will demonstrate at least 10 lbs R grip strength as needed to open jars and other containers. Baseline: 4.4 lbs 09/28/23: Botox  injections  10/12/2023: R - 1.7 lbs; L - 4.1, 7.4 lbs Goal status: IN PROGRESS  ASSESSMENT:  CLINICAL IMPRESSION: Patient participated well in FM tasks focused on pincer grasp but is disappointed that her sustained pinch diminishes (especially on R side) and that she uses compensatory motions, especially since the  Botox .  Pt had good participation though and HEP ideas shared. Pt will benefit from continued skilled OT services in the outpatient setting to work on impairments to help pt maximize function as able.     PERFORMANCE DEFICITS: in functional skills including ADLs, IADLs, coordination, dexterity, strength, muscle spasms, Fine motor control, Gross motor control, continence, skin integrity, and UE functional use,   IMPAIRMENTS: are limiting patient from ADLs, IADLs, work, and leisure.   CO-MORBIDITIES: has co-morbidities such as incontinence and wound that affects occupational performance. Patient will benefit from skilled OT to address above impairments and improve overall function.  REHAB POTENTIAL: Fair due to chronicity of injury  PLAN:  OT FREQUENCY: 1-2x/week (pt agreeable to starting off at 2xweek then tapering with the idea that she will take a break from therapy following this therapy episode.)  OT DURATION: 8 weeks  PLANNED INTERVENTIONS: self care/ADL training, therapeutic exercise, therapeutic activity, neuromuscular re-education, manual therapy, passive range of motion, balance training, functional mobility training, splinting, patient/family education, energy conservation, coping strategies training, and DME and/or AE instructions  RECOMMENDED OTHER SERVICES: Patient was seen for PT evaluation today with treatment plans coordinated for 2x/week.  CONSULTED AND AGREED WITH PLAN OF CARE: Patient and family member/caregiver  PLAN FOR NEXT SESSION:   ASSESS need to change POC NMES review second unit reciprocal option  Check splints PRN Review/progress HEPs Explore FM tasks and establish weight shifting instruction for pressure relief.  Cutting, drink opening, hair  other ADL AE (jar opener)  Zora Hires, OT 11/03/2023, 5:27 PM

## 2023-11-03 NOTE — Therapy (Signed)
 OUTPATIENT PHYSICAL THERAPY NEURO TREATMENT   Patient Name: [name] MRN: [id number] DOB:[birth year], 73 y.o., female Today's Date: 11/03/2023   PCP: Carmen Capra, MD REFERRING PROVIDER: Celia Coles, MD  END OF SESSION:  PT End of Session - 11/03/23 1103     Visit Number 16    Number of Visits 17   with eval   Date for PT Re-Evaluation 11/18/23   to allow for scheduling delays   Authorization Type [employer] MEDICARE    PT Start Time 1104   from OT session   PT Stop Time 1148    PT Time Calculation (min) 44 min    Equipment Utilized During Treatment Gait belt    Activity Tolerance Patient tolerated treatment well    Behavior During Therapy [facility] for tasks assessed/performed                        Past Medical History:  Diagnosis Date   CERVICAL POLYP 03/11/2008   Qualifier: Diagnosis of  By: Ethel Henry MD, Joaquim Muir    Colon polyps 2005   on colonscopy Dr. Sandrea Cruel   Fibroid 2004   Per Dr. Alva Jewels   History of shingles    face and mouth   Hx of skin cancer, basal cell    Rosacea    Sciatica of left side 09/28/2013   Scoliosis    noted on mri done for back pain   Past Surgical History:  Procedure Laterality Date   BUNIONECTOMY     Patient Active Problem List   Diagnosis Date Noted   Buttock wound, left, subsequent encounter 03/16/2023   Bronchiectasis with acute exacerbation (HCC) 03/15/2023   Buttock wound, left, initial encounter 11/02/2022   Orthostatic hypotension 08/13/2022   Neurogenic bowel 05/03/2022   Spasticity 05/03/2022   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/03/2022   Nerve pain 05/03/2022   Medication monitoring encounter 01/08/2022   Neurogenic bladder 10/11/2021   Urinary incontinence 10/11/2021   ESBL (extended spectrum beta-lactamase) producing bacteria infection 10/09/2021   Recurrent UTI 10/09/2021   Quadriplegia, C5-C7 incomplete (HCC) 01/16/2021   History of spinal fracture 01/16/2021   Suprapubic catheter (HCC) 01/16/2021    Encounter for routine gynecological examination 09/28/2013   Onychomycosis 09/28/2013   Foot deformity, acquired 03/26/2012   Encounter for preventive health examination 12/25/2010   ROSACEA 08/25/2009   Disturbance in sleep behavior 03/11/2008   SKIN CANCER, HX OF 03/11/2008   DYSURIA, HX OF 03/11/2008   Hyperlipidemia 02/10/2007   CERVICALGIA 02/10/2007    ONSET DATE: 08/29/2023 (referral date)  REFERRING DIAG: G82.54 (ICD-10-CM) - Incomplete quadriplegia at C5-C8 level (HCC)  THERAPY DIAG:  Muscle weakness (generalized)  Other symptoms and signs involving the nervous system  Other symptoms and signs involving the musculoskeletal system  Abnormal posture  Other abnormalities of gait and mobility  Rationale for Evaluation and Treatment: Rehabilitation  SUBJECTIVE:  SUBJECTIVE STATEMENT: Pt reports no acute changes since last visit.  No questions after last visit.   From initial eval: Pt familiar to this clinic, last seen Oct-Nov 2024 but has been seen for multiple POCs since her initial injury in 2022. Pt returns to this clinic after being in [place]  for the past few months. While in [place]  in December 2024 patient had a fall where she slid forwards out of her wheelchair onto the floor, ended up fracturing both of her femurs. Pt was hospitalized for 10-11 days and had surgical repair of her femurs. Pt reports she has been cleared of all restrictions since surgery, does have ongoing swelling in both legs and worsened spasticity. Pt reports that the spasticity has slightly improved since it initially started, was told by Dr. Lovorn the swelling may not resolve for 6-8 months (it has been 3 months) and not sure if spasticity will resolve but patient is hopeful that if her swelling improves her  spasticity will improve as well.  Pt asking about other options to help manage the swelling her legs, has tried variable compression stockings (knee high) and her PTs in [place]  recommended tight shapewear. Pt reports that the knee-high compression stockings led to increased swelling in her knees and the shapewear did not help her swelling. Encouraged patient to get thigh-high compression stockings and elevate her LE in PWC. Pt has ordered an articulating bed that will get here next Monday (3/31) so she can better elevate her legs when in bed.  Pt is also concerned about decreased ROM in her legs, [name] works on stretching her legs frequently but she feels she has more motion in her LLE as compared to RLE and may have mild foot drop on her R side. Pt does have PRAFOs to wear at night. Pt has worked up to standing in her standing frame x 30-40 min at a time. Pt also worked on standing with her PT and in // bars in [place] . Pt also reports with her injuries she lost the ability to lock/unlock her R knee but that it is getting better. She reports she lost a lot of stamina during her hospital stay as well.  Pt also has a new wound since last seen in this clinic in her L gluteal fold, shearing injury. Pt was seeing wound care in [place]  and is scheduled to see wound care with [facility] early April (was not able to schedule with [facility] wound care until late April).  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 Yes: NPRS scale: 3-4 Pain location: elbows to fingertips on both arms Pain description: nerve pain Aggravating factors: not stated Relieving factors: not stated  PRECAUTIONS: Fall and Other: osteoporosis  RED FLAGS: None   WEIGHT BEARING RESTRICTIONS: No  FALLS: Has patient fallen in last 6 months? Yes. Number of falls 1 fall in [place]  that resulted in B femur  fractures  LIVING ENVIRONMENT: Lives with: lives with their spouse and and with full-time caregiver [name] Lives in: House/apartment Home is power wheelchair accessible Has following equipment at home: Wheelchair (power), Wheelchair (manual), Grab bars, Ramped entry, and standing frame, slide board  PLOF: Independent with household mobility with device, Independent with community mobility with device, Requires assistive device for independence, Needs assistance with ADLs, and Needs assistance with transfers  PATIENT GOALS: "still working on stand and pivot with goal  to pivot to commode or to a chair" "work on core-will help me with standing" "improve my stamina - being in the hospital I lost strength/endurance"   OBJECTIVE:  Note: Objective measures were completed at Evaluation unless otherwise noted.  DIAGNOSTIC FINDINGS: None update/relevant to this POC  COGNITION: Overall cognitive status: Within functional limits for tasks assessed   SENSATION: Decreased sensation in BUE and BLE secondary to incomplete quadriplegia Decreased sensation in proximal LLE as compared to distal LE  EDEMA:  Circumferential: R knee: 17"; L knee: 17.5" and Figure 8: R ankle 21", L ankle 21.5"  MUSCLE TONE: increased spasticity in BLE   POSTURE: rounded shoulders and forward head  LOWER EXTREMITY ROM:     Passive  Right Eval Left Eval  Hip flexion Tight hip flexors Tight hip flexors  Hip extension    Hip abduction    Hip adduction    Hip internal rotation    Hip external rotation    Knee flexion Tight HS Tight HS  Knee extension    Ankle dorsiflexion Decreased, tight gastroc Decreased, tight gastroc  Ankle plantarflexion    Ankle inversion    Ankle eversion     (Blank rows = not tested)  LOWER EXTREMITY MMT:    MMT Right Eval Left Eval  Hip flexion 1 2-  Hip extension    Hip abduction    Hip adduction    Hip internal rotation    Hip external rotation    Knee flexion 0 3  Knee  extension 2- 2-  Ankle dorsiflexion 2- 3  Ankle plantarflexion    Ankle inversion    Ankle eversion    (Blank rows = not tested)  BED MOBILITY:  From previous POC: Sit to supine Mod A Supine to sit Mod A Rolling to Right Mod A Rolling to Left Mod A Undulating mattress for wound management on standard bed (elevated-so often doing uphill sliding board transfers); she would like to continue working on sitting up independently, she has been working on rolling, needs less assistance w/ this when someone props her leg into hooklying; would like something to help her pull her left leg to her butt for stretching as well as bed mobility.  TRANSFERS: From previous POC: Pt continues using combination of bump over, slide board, and depression (squat pivot) transfers.                                                                                                                              TREATMENT:   NMR Standing in // bars, max A to stand x 3 reps Stands x 3 min, x 2:23, x 1 min Able to lock/unlockk L knee; can lock R knee one time but cannot unlock Needs manual and verbal cues for glute activation as hips tend to sink backwards 35.5" high // bars, dycem gloves  Bump transfer PWC to mat table with min A, increased time needed to complete transfer. Reviewed head/hips  relationship to assist with weight shift during transfer.  To work on core strengthening and sitting balance: Seated EOM reaching for small cones on floor 2 x 6 reps with min A for sitting balance/trunk control  Slide board transfer back to w/c with min A. Pt left seated in PWC with [name] at end of session.   PATIENT EDUCATION: Education details: continue stretching program Person educated: Patient and Arts administrator Education method: Explanation Education comprehension: verbalized understanding  HOME EXERCISE PROGRAM: Will be established as needed as pt has done continuous therapy and is working towards functional  tasks.   GOALS: Goals reviewed with patient? Yes  SHORT TERM GOALS: Target date: 10/07/2023  HEP to be established for stretching and strengthening as appropriate. Baseline: not established at initial eval, reviewed during PT POC - pt and [name] report it is going well at home (4/25) Goal status: MET  2.  Pt will perform sit to stand transfer with LRAD with mod A Baseline: max A to stedy (4/4), max A in // bars (4/25) Goal status: IN PROGRESS  3.  Pt to tolerate standing x 5 min with LRAD to demonstrate improved endurance Baseline: 30 sec in stedy (initial), 2:35 min in // bars (4/25) Goal status: IN PROGRESS  4.  Pt to demonstrate reduced edema in BLE with a reduction in circumference/figure 8 measurement by 0.5" from initial evaluation. Baseline: Circumferential: R knee: 17"; L knee: 17.5" and Figure 8: R ankle 21", L ankle 21.5" 4/25: R knee 16", L knee: 16 11/16", R ankle: 21", L ankle 20" Goal status: IN PROGRESS   LONG TERM GOALS: Target date: 11/04/2023  Patient and her caregiver to be independent with performance of HEP for stretching and strengthening as appropriate. Baseline: not established at initial eval, reviewed during PT POC - pt and [name] report it is going well at home (4/25) Goal status: INITIAL  2.  Pt will perform sit to stand transfer with LRAD with min A Baseline: max A to stedy (4/4), max A in // bars (4/25), max A in // bars (5/22) Goal status: NOT MET  3.  Pt to tolerate standing x 5 min with LRAD to demonstrate improved endurance Baseline: 30 sec in stedy (initial), 2:35 min in // bars (4/25), 3 min in // bars (5/22) Goal status: NOT MET  4.  Pt to demonstrate reduced edema in BLE with a reduction in circumference/figure 8 measurement by 1" from initial evaluation. Baseline: Circumferential: R knee: 17"; L knee: 17.5" and Figure 8: R ankle 21", L ankle 21.5" 4/25: R knee 16", L knee: 16 11/16", R ankle: 21", L ankle 20" Goal status:  INITIAL    ASSESSMENT:  CLINICAL IMPRESSION: Emphasis of skilled PT session on working on standing in // bars and seated balance/core strengthening. Pt with improved ability to stand this session as compared to previous session attempt. Pt also with improved tolerance and endurance with ability to stand x 3 min this visit. Pt also able to exhibit increased B quad activation in standing. She continues to benefit from skilled PT services to work towards LTGs, plan to assess LTG next visit. Continue POC.   OBJECTIVE IMPAIRMENTS: decreased balance, decreased endurance, decreased mobility, difficulty walking, decreased ROM, decreased strength, increased edema, impaired perceived functional ability, increased muscle spasms, impaired flexibility, impaired sensation, impaired tone, impaired UE functional use, postural dysfunction, and pain.   ACTIVITY LIMITATIONS: carrying, lifting, bending, standing, stairs, transfers, bed mobility, continence, bathing, toileting, dressing, reach over head, and hygiene/grooming  PARTICIPATION LIMITATIONS: meal prep, cleaning, laundry, driving, shopping, community activity, and occupation  PERSONAL FACTORS: Age, Sex, Time since onset of injury/illness/exacerbation, and 1-2 comorbidities:   C7 ASIA C- incomplete quad w/ neurogenic bladder and bowel, HLD, Hx of skin cancerare also affecting patient's functional outcome.   REHAB POTENTIAL: Good  CLINICAL DECISION MAKING: Stable/uncomplicated  EVALUATION COMPLEXITY: High  PLAN:  PT FREQUENCY: 2x/week  PT DURATION: 8 weeks  PLANNED INTERVENTIONS: 97164- PT Re-evaluation, 97110-Therapeutic exercises, 97530- Therapeutic activity, 97112- Neuromuscular re-education, 97535- Self Care, 14782- Manual therapy, [phone]- Gait training, [phone]- Orthotic Fit/training, [phone]- Electrical stimulation (manual), Patient/Family education, Balance training, Stair training, Taping, Dry Needling, Joint mobilization, Scar mobilization,  Compression bandaging, DME instructions, Wheelchair mobility training, Cryotherapy, and Moist heat  PLAN FOR NEXT SESSION: RECERT, any questions over PROM/stretching? sit to stands, standing tolerance with stedy, in // bars, possibly with RW, core strengthening/stability, endurance; wants to have conversation about taking a break (3-6 months) after this POC near end of this POC, standing lateral weight shifts, mini-squats in standing?, work on bump transfers, assess cushions if patient brings them - TT to reach out to [facility] to set this up, Yoga block press-ups vs push-up blocks, long-sitting - unsupported for core work?, bumping on [employer], standing  Write LMN for new cushion  [employer], PT Lorita Rosa, PT, DPT, CSRS    11/03/2023, 11:49 AM

## 2023-11-06 ENCOUNTER — Other Ambulatory Visit: Payer: Self-pay | Admitting: Internal Medicine

## 2023-11-06 ENCOUNTER — Encounter: Payer: Self-pay | Admitting: Internal Medicine

## 2023-11-08 ENCOUNTER — Encounter: Payer: Self-pay | Admitting: Internal Medicine

## 2023-11-08 MED ORDER — FESOTERODINE FUMARATE ER 8 MG PO TB24: 8.0000 mg | ORAL_TABLET | Freq: Every day | ORAL | 1 refills | Status: DC

## 2023-11-08 NOTE — Telephone Encounter (Signed)
 We just  filled a request for toviaz   and  Mybetric was given by Dr Eugena Herter  : these are 2 bladder medications and I am not aware she is on both nor familiar with treatment with both meds at the same time .  Please have her clarify  if urologist or rehab team advise she be on 2 bladder meds?

## 2023-11-09 ENCOUNTER — Encounter: Attending: Physical Medicine and Rehabilitation | Admitting: Physical Medicine and Rehabilitation

## 2023-11-09 ENCOUNTER — Encounter: Payer: Self-pay | Admitting: Physical Medicine and Rehabilitation

## 2023-11-09 VITALS — BP 129/77 | HR 65 | Ht 64.0 in

## 2023-11-09 DIAGNOSIS — G8254 Quadriplegia, C5-C7 incomplete: Secondary | ICD-10-CM | POA: Insufficient documentation

## 2023-11-09 DIAGNOSIS — R252 Cramp and spasm: Secondary | ICD-10-CM | POA: Diagnosis not present

## 2023-11-09 NOTE — Progress Notes (Signed)
 Subjective:    Patient ID: [name], female    DOB: Feb 01, 1951, 73 y.o.   MRN: [id number]  HPI  [name] is a 73 y.o. year old female  who  has a past medical history of CERVICAL POLYP (03/11/2008), Colon polyps (2005), Fibroid (2004), History of shingles, skin cancer, basal cell, Rosacea, Sciatica of left side (09/28/2013), and Scoliosis.   They are presenting to PM&R clinic for follow up related to hand spasticity - follows with Dr. Lovorn for  incomplete quadriplegia- 2/14 2022- fleeing the police in [place] on passenger 100 (high speed) miles/hour, [name] .  Plan from last visit: 200 U Botox  to bilateral Ues as below:   80  LUE  FDS - 30 FDP -  50 FCU - 20   100 RUE FDS - 30 FDP - 30 FCU - 20 R lumbricals - 20 u (5 ea)     Interval Hx: She has not noticed any finger abduction improvement in her right hand, and overall does not note much improvement.  In her left hand, she feels it helped with her tone but her middle finger is difficult to grip and staying in extension.   For ADLS, she was recently recommended to get an electric toothbrush and now has to hold it with 2 hands.   She has notices she cannot pinch as well either. She is having to type with just her middle finger on the left hand for example.-- Her OT has said she is using her hand muscles more than her wrists muscles now, which ?good.   Her R index finger does not have enough pressure to type the key, but her OT says she has the strength to type better in the right hand if the tone is better under control.   Pain Inventory Average Pain 4 Pain Right Now 4 My pain is na  In the last 24 hours, has pain interfered with the following? General activity 0 Relation with others 0 Enjoyment of life 0 What TIME of day is your pain at its worst? evening Sleep (in general) Good  Pain is worse with: unsure Pain improves with: medication Relief from Meds: 7  Family History  Problem  Relation Age of Onset   Hypertension Father    Osteoporosis Other    Breast cancer Neg Hx    Social History   Socioeconomic History   Marital status: Married    Spouse name: Not on file   Number of children: Not on file   Years of education: Not on file   Highest education level: Doctorate  Occupational History   Not on file  Tobacco Use   Smoking status: Never   Smokeless tobacco: Never  Vaping Use   Vaping status: Never Used  Substance and Sexual Activity   Alcohol use: Not Currently    Alcohol/week: 7.0 standard drinks of alcohol    Types: 7 Glasses of wine per week   Drug use: Yes    Comment: wine at night   Sexual activity: Not on file  Other Topics Concern   Not on file  Social History Narrative   Married   Spouse had CABG    [employer] professor PhD   [employer]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   Had moved to [place]   hh of 2    Quadripareisis from [employer] injury     No current pets       Social Drivers of Corporate investment banker  Strain: Low Risk  (09/11/2023)   Received from [facility]   Overall Financial Resource Strain (CARDIA)    Difficulty of Paying Living Expenses: Not hard at all  Food Insecurity: No Food Insecurity (09/11/2023)   Received from [facility]   Hunger Vital Sign    Worried About Running Out of Food in the Last Year: Never true    Ran Out of Food in the Last Year: Never true  Transportation Needs: No Transportation Needs (09/11/2023)   Received from [facility] - Transportation    Lack of Transportation (Medical): No    Lack of Transportation (Non-Medical): No  Physical Activity: Insufficiently Active (09/11/2023)   Received from [facility]   Exercise Vital Sign    Days of Exercise per Week: 3 days    Minutes of Exercise per Session: 20 min  Stress: No Stress Concern Present (09/11/2023)   Received from [facility] of Occupational Health - Occupational Stress Questionnaire    Feeling of Stress : Not at all  Social  Connections: Socially Integrated (09/11/2023)   Received from [facility]   Social Network    How would you rate your social network (family, work, friends)?: Good participation with social networks   Past Surgical History:  Procedure Laterality Date   BUNIONECTOMY     Past Surgical History:  Procedure Laterality Date   BUNIONECTOMY     Past Medical History:  Diagnosis Date   CERVICAL POLYP 03/11/2008   Qualifier: Diagnosis of  By: Ethel Henry MD, Joaquim Muir    Colon polyps 2005   on colonscopy Dr. Sandrea Cruel   Fibroid 2004   Per Dr. Alva Jewels   History of shingles    face and mouth   Hx of skin cancer, basal cell    Rosacea    Sciatica of left side 09/28/2013   Scoliosis    noted on mri done for back pain   BP 129/77   Pulse 65   Ht 5\' 4"  (1.626 m)   SpO2 98%   BMI 19.40 kg/m   Opioid Risk Score:   Fall Risk Score:  `1  Depression screen [facility] 2/9     11/09/2023    1:15 PM 09/28/2023   11:51 AM 09/19/2023    9:55 AM 09/02/2023   10:21 AM 04/25/2023   10:14 AM 03/15/2023    3:19 PM 10/13/2022    9:40 AM  Depression screen PHQ 2/9  Decreased Interest 0 0 0 0 0 0 0  Down, Depressed, Hopeless 0 0 0 0 0 0 0  PHQ - 2 Score 0 0 0 0 0 0 0     Review of Systems  All other systems reviewed and are negative.      Objective:   Physical Exam   PE: Constitution: Appropriate appearance for age. No apparent distress  Resp: No respiratory distress. No accessory muscle usage. on RA Cardio: Well perfused appearance. No peripheral edema. Abdomen: Nondistended. Psych: Appropriate mood and affect. Neuro: AAOx4. No apparent cognitive deficits   Neurologic Exam:    Tone:   RUE: MAS 3 DIPS, Mas 2 PIPS, MAS 3 finger adductors - sparing thumb; MAS 2 wrist flexor - + tenodesis   LUE: Mas 1 DIPS/PIPS 1-2, sparing 3rd digit, MAS 2 PIPS 3-4; MAS 0 finger adductors, MAS 1 wrist flexor  Strength: LUE - 2+/5 grip, 3-/5 FA, 2/5 WE; 5-/5 EF, EE, SA RUE - 1-2 grip, 0/5 FA, 2/5 WE, 5-/5 EF, EE,  SA      Assessment & Plan:   [name] is a 73 y.o. year old female  who  has a past medical history of CERVICAL POLYP (03/11/2008), Colon polyps (2005), Fibroid (2004), History of shingles, skin cancer, basal cell, Rosacea, Sciatica of left side (09/28/2013), and Scoliosis.  They are presenting to PM&R clinic for follow up related to hand spasticity - follows with Dr. Lovorn for  incomplete quadriplegia s/p C5-6 fusion.   Spasticity  Quadriplegia, C5-C7 incomplete (HCC)     Follow up around 7/16 for repeat injections; we will increase the dosage for the right hand and try to target the 4-5th digits in the left hand more, but keep the same dosage.   6 weeks after that, follow up with me for re-assessment.   Tell your OT Charissa Compton to reach out to me if she has any questions or wants to coordinate care. I will also send her this office note.

## 2023-11-09 NOTE — Patient Instructions (Addendum)
 Follow up around 7/16 for repeat injections; we will increase the dosage for the right hand and try to target the 4-5th digits in the left hand more, but keep the same dosage.   6 weeks after that, follow up with me for re-assessment.   Tell your OT Charissa Compton to reach out to me if she has any questions or wants to coordinate care. I will also send her this office note.

## 2023-11-10 NOTE — Telephone Encounter (Signed)
 Wouldn't  it be better for urology if you are in active patient monitoring by them  to be filling these  medications  to avoid future confusion.? If you wish we can do the refills .If that is better for you (but if there are changes  in meds  per urology adds a step to change and update meds  . )

## 2023-11-11 ENCOUNTER — Ambulatory Visit: Admitting: Physical Therapy

## 2023-11-11 DIAGNOSIS — R29818 Other symptoms and signs involving the nervous system: Secondary | ICD-10-CM | POA: Diagnosis not present

## 2023-11-11 DIAGNOSIS — R278 Other lack of coordination: Secondary | ICD-10-CM

## 2023-11-11 DIAGNOSIS — M24542 Contracture, left hand: Secondary | ICD-10-CM | POA: Diagnosis not present

## 2023-11-11 DIAGNOSIS — M25641 Stiffness of right hand, not elsewhere classified: Secondary | ICD-10-CM | POA: Diagnosis not present

## 2023-11-11 DIAGNOSIS — R293 Abnormal posture: Secondary | ICD-10-CM

## 2023-11-11 DIAGNOSIS — M6281 Muscle weakness (generalized): Secondary | ICD-10-CM | POA: Diagnosis not present

## 2023-11-11 DIAGNOSIS — R29898 Other symptoms and signs involving the musculoskeletal system: Secondary | ICD-10-CM

## 2023-11-11 DIAGNOSIS — R2689 Other abnormalities of gait and mobility: Secondary | ICD-10-CM | POA: Diagnosis not present

## 2023-11-11 DIAGNOSIS — R208 Other disturbances of skin sensation: Secondary | ICD-10-CM | POA: Diagnosis not present

## 2023-11-11 NOTE — Therapy (Signed)
 OUTPATIENT PHYSICAL THERAPY NEURO TREATMENT - RECERTIFICATION   Patient Name: ARINE FOLEY MRN: [id number] DOB:1950-09-17, 73 y.o., female Today's Date: 11/11/2023   PCP: Reginal Capra, MD REFERRING PROVIDER: Celia Coles, MD  END OF SESSION:   PT End of Session - 11/11/23 2952     Visit Number 17    Number of Visits 29   with eval   Date for PT Re-Evaluation 01/01/24   to allow for scheduling delays   Authorization Type [employer] MEDICARE    PT Start Time 0930    PT Stop Time 1015    PT Time Calculation (min) 45 min    Equipment Utilized During Treatment Gait belt    Activity Tolerance Patient tolerated treatment well    Behavior During Therapy [facility] for tasks assessed/performed                        Past Medical History:  Diagnosis Date   CERVICAL POLYP 03/11/2008   Qualifier: Diagnosis of  By: Ethel Henry MD, Joaquim Muir    Colon polyps 2005   on colonscopy Dr. Sandrea Cruel   Fibroid 2004   Per Dr. Alva Jewels   History of shingles    face and mouth   Hx of skin cancer, basal cell    Rosacea    Sciatica of left side 09/28/2013   Scoliosis    noted on mri done for back pain   Past Surgical History:  Procedure Laterality Date   BUNIONECTOMY     Patient Active Problem List   Diagnosis Date Noted   Buttock wound, left, subsequent encounter 03/16/2023   Bronchiectasis with acute exacerbation (HCC) 03/15/2023   Buttock wound, left, initial encounter 11/02/2022   Orthostatic hypotension 08/13/2022   Neurogenic bowel 05/03/2022   Spasticity 05/03/2022   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/03/2022   Nerve pain 05/03/2022   Medication monitoring encounter 01/08/2022   Neurogenic bladder 10/11/2021   Urinary incontinence 10/11/2021   ESBL (extended spectrum beta-lactamase) producing bacteria infection 10/09/2021   Recurrent UTI 10/09/2021   Quadriplegia, C5-C7 incomplete (HCC) 01/16/2021   History of spinal fracture 01/16/2021   Suprapubic catheter (HCC) 01/16/2021    Encounter for routine gynecological examination 09/28/2013   Onychomycosis 09/28/2013   Foot deformity, acquired 03/26/2012   Encounter for preventive health examination 12/25/2010   ROSACEA 08/25/2009   Disturbance in sleep behavior 03/11/2008   SKIN CANCER, HX OF 03/11/2008   DYSURIA, HX OF 03/11/2008   Hyperlipidemia 02/10/2007   CERVICALGIA 02/10/2007    ONSET DATE: 08/29/2023 (referral date)  REFERRING DIAG: G82.54 (ICD-10-CM) - Incomplete quadriplegia at C5-C8 level (HCC)  THERAPY DIAG:  Other lack of coordination  Muscle weakness (generalized)  Other symptoms and signs involving the nervous system  Other symptoms and signs involving the musculoskeletal system  Abnormal posture  Rationale for Evaluation and Treatment: Rehabilitation  SUBJECTIVE:  SUBJECTIVE STATEMENT: Pt reports no acute changes since last visit.  No questions after last visit.   From initial eval: Pt familiar to this clinic, last seen Oct-Nov 2024 but has been seen for multiple POCs since her initial injury in 2022. Pt returns to this clinic after being in [place]  for the past few months. While in [place]  in December 2024 patient had a fall where she slid forwards out of her wheelchair onto the floor, ended up fracturing both of her femurs. Pt was hospitalized for 10-11 days and had surgical repair of her femurs. Pt reports she has been cleared of all restrictions since surgery, does have ongoing swelling in both legs and worsened spasticity. Pt reports that the spasticity has slightly improved since it initially started, was told by Dr. Lovorn the swelling may not resolve for 6-8 months (it has been 3 months) and not sure if spasticity will resolve but patient is hopeful that if her swelling improves her spasticity will  improve as well.  Pt asking about other options to help manage the swelling her legs, has tried variable compression stockings (knee high) and her PTs in [place]  recommended tight shapewear. Pt reports that the knee-high compression stockings led to increased swelling in her knees and the shapewear did not help her swelling. Encouraged patient to get thigh-high compression stockings and elevate her LE in PWC. Pt has ordered an articulating bed that will get here next Monday (3/31) so she can better elevate her legs when in bed.  Pt is also concerned about decreased ROM in her legs, [name] works on stretching her legs frequently but she feels she has more motion in her LLE as compared to RLE and may have mild foot drop on her R side. Pt does have PRAFOs to wear at night. Pt has worked up to standing in her standing frame x 30-40 min at a time. Pt also worked on standing with her PT and in // bars in [place] . Pt also reports with her injuries she lost the ability to lock/unlock her R knee but that it is getting better. She reports she lost a lot of stamina during her hospital stay as well.  Pt also has a new wound since last seen in this clinic in her L gluteal fold, shearing injury. Pt was seeing wound care in [place]  and is scheduled to see wound care with [facility] early April (was not able to schedule with [facility] wound care until late April).  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 Yes: NPRS scale: 3-4 Pain location: elbows to fingertips on both arms Pain description: nerve pain Aggravating factors: not stated Relieving factors: not stated  PRECAUTIONS: Fall and Other: osteoporosis  RED FLAGS: None   WEIGHT BEARING RESTRICTIONS: No  FALLS: Has patient fallen in last 6 months? Yes. Number of falls 1 fall in [place]  that resulted in B femur fractures  LIVING  ENVIRONMENT: Lives with: lives with their spouse and and with full-time caregiver [name] Lives in: House/apartment Home is power wheelchair accessible Has following equipment at home: Wheelchair (power), Wheelchair (manual), Grab bars, Ramped entry, and standing frame, slide board  PLOF: Independent with household mobility with device, Independent with community mobility with device, Requires assistive device for independence, Needs assistance with ADLs, and Needs assistance with transfers  PATIENT GOALS: "still working on stand and pivot with goal  to pivot to commode or to a chair" "work on core-will help me with standing" "improve my stamina - being in the hospital I lost strength/endurance"   OBJECTIVE:  Note: Objective measures were completed at Evaluation unless otherwise noted.  DIAGNOSTIC FINDINGS: None update/relevant to this POC  COGNITION: Overall cognitive status: Within functional limits for tasks assessed   SENSATION: Decreased sensation in BUE and BLE secondary to incomplete quadriplegia Decreased sensation in proximal LLE as compared to distal LE  EDEMA:  Circumferential: R knee: 17"; L knee: 17.5" and Figure 8: R ankle 21", L ankle 21.5"  MUSCLE TONE: increased spasticity in BLE   POSTURE: rounded shoulders and forward head  LOWER EXTREMITY ROM:     Passive  Right Eval Left Eval  Hip flexion Tight hip flexors Tight hip flexors  Hip extension    Hip abduction    Hip adduction    Hip internal rotation    Hip external rotation    Knee flexion Tight HS Tight HS  Knee extension    Ankle dorsiflexion Decreased, tight gastroc Decreased, tight gastroc  Ankle plantarflexion    Ankle inversion    Ankle eversion     (Blank rows = not tested)  LOWER EXTREMITY MMT:    MMT Right Eval Left Eval  Hip flexion 1 2-  Hip extension    Hip abduction    Hip adduction    Hip internal rotation    Hip external rotation    Knee flexion 0 3  Knee extension 2- 2-   Ankle dorsiflexion 2- 3  Ankle plantarflexion    Ankle inversion    Ankle eversion    (Blank rows = not tested)  BED MOBILITY:  From previous POC: Sit to supine Mod A Supine to sit Mod A Rolling to Right Mod A Rolling to Left Mod A Undulating mattress for wound management on standard bed (elevated-so often doing uphill sliding board transfers); she would like to continue working on sitting up independently, she has been working on rolling, needs less assistance w/ this when someone props her leg into hooklying; would like something to help her pull her left leg to her butt for stretching as well as bed mobility.  TRANSFERS: From previous POC: Pt continues using combination of bump over, slide board, and depression (squat pivot) transfers.                                                                                                                              TREATMENT:   TherAct For LTG assessment: 4/25: R knee 16", L knee: 16 11/16", R ankle: 21", L ankle 20" 5/30: R knee: 16", L knee: 16", R ankle: 20.5", L ankle: 20"  Slide board transfer PWC to mat table with min A from caregiver [name]. While seated EOM pt able to work on anterior lean and press-ups with use of press-up blocks x 5 reps. Pt exhibits improved technique and increased height of  her lift when placing blocks closer to her body/thighs vs further away.  Transition to long-sit on mat table with mod A needed for BLE management: Long-sit to work on HS stretch and core stabilization: 2# weighted dowel chest press 2 x 10 reps 2# weighted dowel raises 2 x 10 reps Zoom ball x 20 reps, several LOB posteriorly requiring max A to recover   PATIENT EDUCATION: Education details: continue stretching program, results of edema measurements Person educated: Patient and Caregiver [name] Education method: Explanation Education comprehension: verbalized understanding  HOME EXERCISE PROGRAM: Will be established as needed as pt  has done continuous therapy and is working towards functional tasks.   GOALS: Goals reviewed with patient? Yes  SHORT TERM GOALS: Target date: 10/07/2023  HEP to be established for stretching and strengthening as appropriate. Baseline: not established at initial eval, reviewed during PT POC - pt and [name] report it is going well at home (4/25) Goal status: MET  2.  Pt will perform sit to stand transfer with LRAD with mod A Baseline: max A to stedy (4/4), max A in // bars (4/25) Goal status: IN PROGRESS  3.  Pt to tolerate standing x 5 min with LRAD to demonstrate improved endurance Baseline: 30 sec in stedy (initial), 2:35 min in // bars (4/25) Goal status: IN PROGRESS  4.  Pt to demonstrate reduced edema in BLE with a reduction in circumference/figure 8 measurement by 0.5" from initial evaluation. Baseline: Circumferential: R knee: 17"; L knee: 17.5" and Figure 8: R ankle 21", L ankle 21.5" 4/25: R knee 16", L knee: 16 11/16", R ankle: 21", L ankle 20" Goal status: IN PROGRESS   LONG TERM GOALS: Target date: 11/04/2023  Patient and her caregiver to be independent with performance of HEP for stretching and strengthening as appropriate. Baseline: not established at initial eval, reviewed during PT POC - pt and [name] report it is going well at home (4/25) Goal status: MET  2.  Pt will perform sit to stand transfer with LRAD with min A Baseline: max A to stedy (4/4), max A in // bars (4/25), max A in // bars (5/22) Goal status: NOT MET  3.  Pt to tolerate standing x 5 min with LRAD to demonstrate improved endurance Baseline: 30 sec in stedy (initial), 2:35 min in // bars (4/25), 3 min in // bars (5/22) Goal status: NOT MET  4.  Pt to demonstrate reduced edema in BLE with a reduction in circumference/figure 8 measurement by 1" from initial evaluation. Baseline: Circumferential:  R knee: 17"; L knee: 17.5" and Figure 8: R ankle 21", L ankle 21.5" 4/25: R knee 16", L knee: 16  11/16", R ankle: 21", L ankle 20" 5/30: R knee: 16", L knee: 16", R ankle: 20.5", L ankle: 20" Goal status: PARTIALLY MET   NEW SHORT TERM GOALS:   Target date: 12/04/2023  Pt will perform sit to stand transfer with LRAD with mod A Baseline: max A to stedy (4/4), max A in // bars (4/25), max A in // bars (5/22) Goal status: REVISED  2.  Pt to tolerate standing x 4 min with LRAD to demonstrate improved endurance Baseline: 30 sec in stedy (initial), 2:35 min in // bars (4/25), 3 min in // bars (5/22) Goal status: REVISED  3.  Pt will progress to performing bump transfers with mod A at the most for increased independence with functional mobility Baseline: up to max A (5/30) Goal status: INITIAL   NEW LONG TERM  GOALS:  Target date: 12/26/2023  Pt will perform sit to stand transfer with LRAD with min A Baseline: max A to stedy (4/4), max A in // bars (4/25), max A in // bars (5/22) Goal status: REVISED  2.  Pt to tolerate standing x 5 min with LRAD to demonstrate improved endurance Baseline: 30 sec in stedy (initial), 2:35 min in // bars (4/25), 3 min in // bars (5/22) Goal status: REVISED  3.  Pt will progress to performing bump transfers with min A at the most for increased independence with functional mobility Baseline: up to max A (5/30) Goal status: INITIAL  4.  Pt will be able to maintain dynamic sitting balance in long-sit position x 5 min while performing a functional task with no more than mod A Baseline: max A (5/30) Goal status: INITIAL    ASSESSMENT:  CLINICAL IMPRESSION: Emphasis of skilled PT session on assessing LTG for recertification of PT services this date as well as continuing to work on UB strengthening for progression to bump transfers and core stability in long-sit position. Pt has met or partially met 2/4 LTG due to being independent with her stretching and strengthening program at home with assist from her nurse/caregiver [name] and decreasing the swelling  in her BLE (knees and ankles) by 0.5-1" in each joint. She has improved her standing tolerance from less than 30 sec to up to 3 min from initial eval which is a significant improvement but she did not quite meet goal of 5 min. Additionally, she continues to require max A to stand in // bars. She continues to benefit from skilled PT services to work towards functional strengthening for improved balance and decreased fall risk as well as increased independence with functional mobility. Due to B femur fractures and an extended hospital stay in December of 2024 she lost a lot of strength and independence that she had gained in therapy and continues to require skilled PT services to return to her PLOF from before December of 2024. Continue POC.   OBJECTIVE IMPAIRMENTS: decreased balance, decreased endurance, decreased mobility, difficulty walking, decreased ROM, decreased strength, increased edema, impaired perceived functional ability, increased muscle spasms, impaired flexibility, impaired sensation, impaired tone, impaired UE functional use, postural dysfunction, and pain.   ACTIVITY LIMITATIONS: carrying, lifting, bending, standing, stairs, transfers, bed mobility, continence, bathing, toileting, dressing, reach over head, and hygiene/grooming  PARTICIPATION LIMITATIONS: meal prep, cleaning, laundry, driving, shopping, community activity, and occupation  PERSONAL FACTORS: Age, Sex, Time since onset of injury/illness/exacerbation, and 1-2 comorbidities:   C7 ASIA C- incomplete quad w/ neurogenic bladder and bowel, HLD, Hx of skin cancerare also affecting patient's functional outcome.   REHAB POTENTIAL: Good  CLINICAL DECISION MAKING: Stable/uncomplicated  EVALUATION COMPLEXITY: High  PLAN:  PT FREQUENCY: 2x/week  PT DURATION: 8 weeks  PLANNED INTERVENTIONS: 97164- PT Re-evaluation, 97110-Therapeutic exercises, 97530- Therapeutic activity, 97112- Neuromuscular re-education, 97535- Self Care,  97140- Manual therapy, [phone]- Gait training, 91478- Orthotic Fit/training, 29562- Electrical stimulation (manual), Patient/Family education, Balance training, Stair training, Taping, Dry Needling, Joint mobilization, Scar mobilization, Compression bandaging, DME instructions, Wheelchair mobility training, Cryotherapy, and Moist heat  PLAN FOR NEXT SESSION: any questions over PROM/stretching? sit to stands, standing tolerance with stedy, in // bars, possibly with RW, core strengthening/stability, endurance; wants to have conversation about taking a break (3-6 months) after this POC near end of this POC, standing lateral weight shifts, mini-squats in standing?, Yoga block press-ups vs push-up blocks, long-sitting - unsupported for core work?, bump transfers  on slide board    Lorita Rosa, PT Lorita Rosa, PT, DPT, CSRS    11/11/2023, 10:22 AM

## 2023-11-14 ENCOUNTER — Encounter: Payer: Self-pay | Admitting: Physical Therapy

## 2023-11-14 ENCOUNTER — Ambulatory Visit: Attending: Physical Medicine and Rehabilitation | Admitting: Physical Therapy

## 2023-11-14 DIAGNOSIS — R29898 Other symptoms and signs involving the musculoskeletal system: Secondary | ICD-10-CM

## 2023-11-14 DIAGNOSIS — R29818 Other symptoms and signs involving the nervous system: Secondary | ICD-10-CM | POA: Diagnosis not present

## 2023-11-14 DIAGNOSIS — M24542 Contracture, left hand: Secondary | ICD-10-CM | POA: Diagnosis present

## 2023-11-14 DIAGNOSIS — R293 Abnormal posture: Secondary | ICD-10-CM | POA: Diagnosis not present

## 2023-11-14 DIAGNOSIS — R278 Other lack of coordination: Secondary | ICD-10-CM | POA: Diagnosis not present

## 2023-11-14 DIAGNOSIS — R208 Other disturbances of skin sensation: Secondary | ICD-10-CM

## 2023-11-14 DIAGNOSIS — M24541 Contracture, right hand: Secondary | ICD-10-CM | POA: Diagnosis present

## 2023-11-14 DIAGNOSIS — G8253 Quadriplegia, C5-C7 complete: Secondary | ICD-10-CM | POA: Diagnosis present

## 2023-11-14 DIAGNOSIS — M6281 Muscle weakness (generalized): Secondary | ICD-10-CM | POA: Diagnosis not present

## 2023-11-14 DIAGNOSIS — R2689 Other abnormalities of gait and mobility: Secondary | ICD-10-CM | POA: Diagnosis not present

## 2023-11-14 DIAGNOSIS — M25642 Stiffness of left hand, not elsewhere classified: Secondary | ICD-10-CM | POA: Diagnosis not present

## 2023-11-14 DIAGNOSIS — G8254 Quadriplegia, C5-C7 incomplete: Secondary | ICD-10-CM | POA: Insufficient documentation

## 2023-11-14 DIAGNOSIS — M25641 Stiffness of right hand, not elsewhere classified: Secondary | ICD-10-CM

## 2023-11-14 NOTE — Therapy (Signed)
 OUTPATIENT PHYSICAL THERAPY NEURO TREATMENT    Patient Name: [name] MRN: [id number] DOB:[birth year], 73 y.o., female Today's Date: 11/14/2023   PCP: Reginal Capra, MD REFERRING PROVIDER: Celia Coles, MD  END OF SESSION:   PT End of Session - 11/11/23 5621     Visit Number 17    Number of Visits 29   with eval   Date for PT Re-Evaluation 01/01/24   to allow for scheduling delays   Authorization Type [employer] MEDICARE    PT Start Time 0930    PT Stop Time 1015    PT Time Calculation (min) 45 min    Equipment Utilized During Treatment Gait belt    Activity Tolerance Patient tolerated treatment well    Behavior During Therapy [facility] for tasks assessed/performed                        Past Medical History:  Diagnosis Date   CERVICAL POLYP 03/11/2008   Qualifier: Diagnosis of  By: Ethel Henry MD, Joaquim Muir    Colon polyps 2005   on colonscopy Dr. Sandrea Cruel   Fibroid 2004   Per Dr. Alva Jewels   History of shingles    face and mouth   Hx of skin cancer, basal cell    Rosacea    Sciatica of left side 09/28/2013   Scoliosis    noted on mri done for back pain   Past Surgical History:  Procedure Laterality Date   BUNIONECTOMY     Patient Active Problem List   Diagnosis Date Noted   Buttock wound, left, subsequent encounter 03/16/2023   Bronchiectasis with acute exacerbation (HCC) 03/15/2023   Buttock wound, left, initial encounter 11/02/2022   Orthostatic hypotension 08/13/2022   Neurogenic bowel 05/03/2022   Spasticity 05/03/2022   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/03/2022   Nerve pain 05/03/2022   Medication monitoring encounter 01/08/2022   Neurogenic bladder 10/11/2021   Urinary incontinence 10/11/2021   ESBL (extended spectrum beta-lactamase) producing bacteria infection 10/09/2021   Recurrent UTI 10/09/2021   Quadriplegia, C5-C7 incomplete (HCC) 01/16/2021   History of spinal fracture 01/16/2021   Suprapubic catheter (HCC) 01/16/2021   Encounter for routine  gynecological examination 09/28/2013   Onychomycosis 09/28/2013   Foot deformity, acquired 03/26/2012   Encounter for preventive health examination 12/25/2010   ROSACEA 08/25/2009   Disturbance in sleep behavior 03/11/2008   SKIN CANCER, HX OF 03/11/2008   DYSURIA, HX OF 03/11/2008   Hyperlipidemia 02/10/2007   CERVICALGIA 02/10/2007    ONSET DATE: 08/29/2023 (referral date)  REFERRING DIAG: G82.54 (ICD-10-CM) - Incomplete quadriplegia at C5-C8 level (HCC)  THERAPY DIAG:  Other lack of coordination  Muscle weakness (generalized)  Other symptoms and signs involving the nervous system  Other symptoms and signs involving the musculoskeletal system  Abnormal posture  Stiffness of right hand, not elsewhere classified  Other disturbances of skin sensation  Other abnormalities of gait and mobility  Rationale for Evaluation and Treatment: Rehabilitation  SUBJECTIVE:  SUBJECTIVE STATEMENT: Pt reports no acute changes since last visit.  She would like to revisit the weighted bar task to see what weight she might need to purchase for home.   From initial eval: Pt familiar to this clinic, last seen Oct-Nov 2024 but has been seen for multiple POCs since her initial injury in 2022. Pt returns to this clinic after being in [place]  for the past few months. While in [place]  in December 2024 patient had a fall where she slid forwards out of her wheelchair onto the floor, ended up fracturing both of her femurs. Pt was hospitalized for 10-11 days and had surgical repair of her femurs. Pt reports she has been cleared of all restrictions since surgery, does have ongoing swelling in both legs and worsened spasticity. Pt reports that the spasticity has slightly improved since it initially started, was told by Dr.  Lovorn the swelling may not resolve for 6-8 months (it has been 3 months) and not sure if spasticity will resolve but patient is hopeful that if her swelling improves her spasticity will improve as well.  Pt asking about other options to help manage the swelling her legs, has tried variable compression stockings (knee high) and her PTs in [place]  recommended tight shapewear. Pt reports that the knee-high compression stockings led to increased swelling in her knees and the shapewear did not help her swelling. Encouraged patient to get thigh-high compression stockings and elevate her LE in PWC. Pt has ordered an articulating bed that will get here next Monday (3/31) so she can better elevate her legs when in bed.  Pt is also concerned about decreased ROM in her legs, [name] works on stretching her legs frequently but she feels she has more motion in her LLE as compared to RLE and may have mild foot drop on her R side. Pt does have PRAFOs to wear at night. Pt has worked up to standing in her standing frame x 30-40 min at a time. Pt also worked on standing with her PT and in // bars in [place] . Pt also reports with her injuries she lost the ability to lock/unlock her R knee but that it is getting better. She reports she lost a lot of stamina during her hospital stay as well.  Pt also has a new wound since last seen in this clinic in her L gluteal fold, shearing injury. Pt was seeing wound care in [place]  and is scheduled to see wound care with [facility] early April (was not able to schedule with [facility] wound care until late April).  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 Yes: NPRS scale: 3-4 Pain location: elbows to fingertips on both arms Pain description: nerve pain Aggravating factors: not stated Relieving factors: not stated  PRECAUTIONS: Fall and Other:  osteoporosis  RED FLAGS: None   WEIGHT BEARING RESTRICTIONS: No  FALLS: Has patient fallen in last 6 months? Yes. Number of falls 1 fall in [place]  that resulted in B femur fractures  LIVING ENVIRONMENT: Lives with: lives with their spouse and and with full-time caregiver [name] Lives in: House/apartment Home is power wheelchair accessible Has following equipment at home: Wheelchair (power), Wheelchair (manual), Grab bars, Ramped entry, and standing frame, slide board  PLOF: Independent with household mobility with device, Independent with community mobility with device, Requires assistive device for independence, Needs assistance with ADLs, and  Needs assistance with transfers  PATIENT GOALS: "still working on stand and pivot with goal to pivot to commode or to a chair" "work on core-will help me with standing" "improve my stamina - being in the hospital I lost strength/endurance"   OBJECTIVE:  Note: Objective measures were completed at Evaluation unless otherwise noted.  DIAGNOSTIC FINDINGS: None update/relevant to this POC  COGNITION: Overall cognitive status: Within functional limits for tasks assessed   SENSATION: Decreased sensation in BUE and BLE secondary to incomplete quadriplegia Decreased sensation in proximal LLE as compared to distal LE  EDEMA:  Circumferential: R knee: 17"; L knee: 17.5" and Figure 8: R ankle 21", L ankle 21.5"  MUSCLE TONE: increased spasticity in BLE   POSTURE: rounded shoulders and forward head  LOWER EXTREMITY ROM:     Passive  Right Eval Left Eval  Hip flexion Tight hip flexors Tight hip flexors  Hip extension    Hip abduction    Hip adduction    Hip internal rotation    Hip external rotation    Knee flexion Tight HS Tight HS  Knee extension    Ankle dorsiflexion Decreased, tight gastroc Decreased, tight gastroc  Ankle plantarflexion    Ankle inversion    Ankle eversion     (Blank rows = not tested)  LOWER EXTREMITY MMT:     MMT Right Eval Left Eval  Hip flexion 1 2-  Hip extension    Hip abduction    Hip adduction    Hip internal rotation    Hip external rotation    Knee flexion 0 3  Knee extension 2- 2-  Ankle dorsiflexion 2- 3  Ankle plantarflexion    Ankle inversion    Ankle eversion    (Blank rows = not tested)  BED MOBILITY:  From previous POC: Sit to supine Mod A Supine to sit Mod A Rolling to Right Mod A Rolling to Left Mod A Undulating mattress for wound management on standard bed (elevated-so often doing uphill sliding board transfers); she would like to continue working on sitting up independently, she has been working on rolling, needs less assistance w/ this when someone props her leg into hooklying; would like something to help her pull her left leg to her butt for stretching as well as bed mobility.  TRANSFERS: From previous POC: Pt continues using combination of bump over, slide board, and depression (squat pivot) transfers.                                                                                                                              TREATMENT:   TherAct Lateral bump transfer to left w/c <> elevated mat table, pt needs several reps and repeated foot placement adjustments due to decreased bottom clearance  -Initial mat elevation to 23.5" height for first stand to [employer] w/ continuous modA for posterior hip support and R list x1 minute 20 seconds -Mat elevated ~3 inches higher and  pt repositioned for better LE alignment for repeated stand to [facility] w/ ongoing posterior hip support w/ improved posture x1 minute 10 seconds > repeated stand from elevated Stedy seat with variation of hand grip to mimic parallel bars grip but pt unable to maintain stand despite improved momentum and pelvic alignment into upright -Return to wc from mat table via right slide board transfer using caregiver and therapist assist for placement  -In wheelchair PT places active hands on  patient for use of weighted bar for chest press and shoulder flexion, w/ active hands on pt is able to tolerate up to 3lbs for several reps w/ good form, increased difficulty with vertical push with 4lbs  PATIENT EDUCATION: Education details: Continue stretching program. Person educated: Patient and Arts administrator Education method: Explanation Education comprehension: verbalized understanding  HOME EXERCISE PROGRAM: Will be established as needed as pt has done continuous therapy and is working towards functional tasks.   GOALS: Goals reviewed with patient? Yes  SHORT TERM GOALS: Target date: 10/07/2023  HEP to be established for stretching and strengthening as appropriate. Baseline: not established at initial eval, reviewed during PT POC - pt and [name] report it is going well at home (4/25) Goal status: MET  2.  Pt will perform sit to stand transfer with LRAD with mod A Baseline: max A to stedy (4/4), max A in // bars (4/25) Goal status: IN PROGRESS  3.  Pt to tolerate standing x 5 min with LRAD to demonstrate improved endurance Baseline: 30 sec in stedy (initial), 2:35 min in // bars (4/25) Goal status: IN PROGRESS  4.  Pt to demonstrate reduced edema in BLE with a reduction in circumference/figure 8 measurement by 0.5" from initial evaluation. Baseline: Circumferential: R knee: 17"; L knee: 17.5" and Figure 8: R ankle 21", L ankle 21.5" 4/25: R knee 16", L knee: 16 11/16", R ankle: 21", L ankle 20" Goal status: IN PROGRESS   LONG TERM GOALS: Target date: 11/04/2023  Patient and her caregiver to be independent with performance of HEP for stretching and strengthening as appropriate. Baseline: not established at initial eval, reviewed during PT POC - pt and [name] report it is going well at home (4/25) Goal status: MET  2.  Pt will perform sit to stand transfer with LRAD with min A Baseline: max A to stedy (4/4), max A in // bars (4/25), max A in // bars (5/22) Goal status: NOT  MET  3.  Pt to tolerate standing x 5 min with LRAD to demonstrate improved endurance Baseline: 30 sec in stedy (initial), 2:35 min in // bars (4/25), 3 min in // bars (5/22) Goal status: NOT MET  4.  Pt to demonstrate reduced edema in BLE with a reduction in circumference/figure 8 measurement by 1" from initial evaluation. Baseline: Circumferential:  R knee: 17"; L knee: 17.5" and Figure 8: R ankle 21", L ankle 21.5" 4/25: R knee 16", L knee: 16 11/16", R ankle: 21", L ankle 20" 5/30: R knee: 16", L knee: 16", R ankle: 20.5", L ankle: 20" Goal status: PARTIALLY MET   NEW SHORT TERM GOALS:   Target date: 12/04/2023  Pt will perform sit to stand transfer with LRAD with mod A Baseline: max A to stedy (4/4), max A in // bars (4/25), max A in // bars (5/22) Goal status: REVISED  2.  Pt to tolerate standing x 4 min with LRAD to demonstrate improved endurance Baseline: 30 sec in stedy (initial), 2:35 min in //  bars (4/25), 3 min in // bars (5/22) Goal status: REVISED  3.  Pt will progress to performing bump transfers with mod A at the most for increased independence with functional mobility Baseline: up to max A (5/30) Goal status: INITIAL   NEW LONG TERM GOALS:  Target date: 12/26/2023  Pt will perform sit to stand transfer with LRAD with min A Baseline: max A to stedy (4/4), max A in // bars (4/25), max A in // bars (5/22) Goal status: REVISED  2.  Pt to tolerate standing x 5 min with LRAD to demonstrate improved endurance Baseline: 30 sec in stedy (initial), 2:35 min in // bars (4/25), 3 min in // bars (5/22) Goal status: REVISED  3.  Pt will progress to performing bump transfers with min A at the most for increased independence with functional mobility Baseline: up to max A (5/30) Goal status: INITIAL  4.  Pt will be able to maintain dynamic sitting balance in long-sit position x 5 min while performing a functional task with no more than mod A Baseline: max A (5/30) Goal  status: INITIAL    ASSESSMENT:  CLINICAL IMPRESSION: Ongoing work on initiation of standing today to decrease level of assistance needed.  Pt having harder time clearing bottom for all transfers.  She requires consistent modA to initiate standing and to remain standing due to need for posterior hip support.  She can tolerate several reps of 3 pound weight bar if active hands being used to support grip.  Her grip remains weaker due to prior botox .  Will continue per POC.   OBJECTIVE IMPAIRMENTS: decreased balance, decreased endurance, decreased mobility, difficulty walking, decreased ROM, decreased strength, increased edema, impaired perceived functional ability, increased muscle spasms, impaired flexibility, impaired sensation, impaired tone, impaired UE functional use, postural dysfunction, and pain.   ACTIVITY LIMITATIONS: carrying, lifting, bending, standing, stairs, transfers, bed mobility, continence, bathing, toileting, dressing, reach over head, and hygiene/grooming  PARTICIPATION LIMITATIONS: meal prep, cleaning, laundry, driving, shopping, community activity, and occupation  PERSONAL FACTORS: Age, Sex, Time since onset of injury/illness/exacerbation, and 1-2 comorbidities:   C7 ASIA C- incomplete quad w/ neurogenic bladder and bowel, HLD, Hx of skin cancerare also affecting patient's functional outcome.   REHAB POTENTIAL: Good  CLINICAL DECISION MAKING: Stable/uncomplicated  EVALUATION COMPLEXITY: High  PLAN:  PT FREQUENCY: 2x/week  PT DURATION: 8 weeks  PLANNED INTERVENTIONS: [id number]- PT Re-evaluation, 97110-Therapeutic exercises, 97530- Therapeutic activity, 97112- Neuromuscular re-education, 97535- Self Care, 16109- Manual therapy, [phone]- Gait training, [phone]- Orthotic Fit/training, [phone]- Electrical stimulation (manual), Patient/Family education, Balance training, Stair training, Taping, Dry Needling, Joint mobilization, Scar mobilization, Compression bandaging, DME  instructions, Wheelchair mobility training, Cryotherapy, and Moist heat  PLAN FOR NEXT SESSION: any questions over PROM/stretching? sit to stands, standing tolerance with stedy, in // bars, possibly with RW, core strengthening/stability, endurance; wants to have conversation about taking a break (3-6 months) after this POC near end of this POC, standing lateral weight shifts, mini-squats in standing?, Yoga block press-ups vs push-up blocks, long-sitting - unsupported for core work?, bump transfers on slide board - pt wants to work on this skill!    Earlean Glaze, PT, DPT    11/14/2023, 12:48 PM

## 2023-11-16 ENCOUNTER — Ambulatory Visit: Admitting: Physical Therapy

## 2023-11-16 DIAGNOSIS — R29898 Other symptoms and signs involving the musculoskeletal system: Secondary | ICD-10-CM

## 2023-11-16 DIAGNOSIS — M6281 Muscle weakness (generalized): Secondary | ICD-10-CM | POA: Diagnosis not present

## 2023-11-16 DIAGNOSIS — R2689 Other abnormalities of gait and mobility: Secondary | ICD-10-CM | POA: Diagnosis not present

## 2023-11-16 DIAGNOSIS — M25641 Stiffness of right hand, not elsewhere classified: Secondary | ICD-10-CM | POA: Diagnosis not present

## 2023-11-16 DIAGNOSIS — R293 Abnormal posture: Secondary | ICD-10-CM

## 2023-11-16 DIAGNOSIS — M25642 Stiffness of left hand, not elsewhere classified: Secondary | ICD-10-CM | POA: Diagnosis not present

## 2023-11-16 DIAGNOSIS — R29818 Other symptoms and signs involving the nervous system: Secondary | ICD-10-CM | POA: Diagnosis not present

## 2023-11-16 DIAGNOSIS — R208 Other disturbances of skin sensation: Secondary | ICD-10-CM | POA: Diagnosis not present

## 2023-11-16 DIAGNOSIS — R278 Other lack of coordination: Secondary | ICD-10-CM | POA: Diagnosis not present

## 2023-11-16 NOTE — Therapy (Signed)
 OUTPATIENT PHYSICAL THERAPY NEURO TREATMENT    Patient Name: [name] MRN: [id number] DOB:[birth year], 73 y.o., female Today's Date: 11/16/2023   PCP: Reginal Capra, MD REFERRING PROVIDER: Celia Coles, MD  END OF SESSION:    PT End of Session - 11/16/23 1015     Visit Number 19    Number of Visits 29   recert   Date for PT Re-Evaluation 01/01/24   to allow for scheduling delays   Authorization Type [employer] MEDICARE    PT Start Time 1014    PT Stop Time 1059    PT Time Calculation (min) 45 min    Equipment Utilized During Treatment Gait belt    Activity Tolerance Patient tolerated treatment well    Behavior During Therapy [facility] for tasks assessed/performed                        Past Medical History:  Diagnosis Date   CERVICAL POLYP 03/11/2008   Qualifier: Diagnosis of  By: Ethel Henry MD, Joaquim Muir    Colon polyps 2005   on colonscopy Dr. Sandrea Cruel   Fibroid 2004   Per Dr. Alva Jewels   History of shingles    face and mouth   Hx of skin cancer, basal cell    Rosacea    Sciatica of left side 09/28/2013   Scoliosis    noted on mri done for back pain   Past Surgical History:  Procedure Laterality Date   BUNIONECTOMY     Patient Active Problem List   Diagnosis Date Noted   Buttock wound, left, subsequent encounter 03/16/2023   Bronchiectasis with acute exacerbation (HCC) 03/15/2023   Buttock wound, left, initial encounter 11/02/2022   Orthostatic hypotension 08/13/2022   Neurogenic bowel 05/03/2022   Spasticity 05/03/2022   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/03/2022   Nerve pain 05/03/2022   Medication monitoring encounter 01/08/2022   Neurogenic bladder 10/11/2021   Urinary incontinence 10/11/2021   ESBL (extended spectrum beta-lactamase) producing bacteria infection 10/09/2021   Recurrent UTI 10/09/2021   Quadriplegia, C5-C7 incomplete (HCC) 01/16/2021   History of spinal fracture 01/16/2021   Suprapubic catheter (HCC) 01/16/2021   Encounter for routine  gynecological examination 09/28/2013   Onychomycosis 09/28/2013   Foot deformity, acquired 03/26/2012   Encounter for preventive health examination 12/25/2010   ROSACEA 08/25/2009   Disturbance in sleep behavior 03/11/2008   SKIN CANCER, HX OF 03/11/2008   DYSURIA, HX OF 03/11/2008   Hyperlipidemia 02/10/2007   CERVICALGIA 02/10/2007    ONSET DATE: 08/29/2023 (referral date)  REFERRING DIAG: G82.54 (ICD-10-CM) - Incomplete quadriplegia at C5-C8 level (HCC)  THERAPY DIAG:  Muscle weakness (generalized)  Other symptoms and signs involving the nervous system  Other symptoms and signs involving the musculoskeletal system  Abnormal posture  Rationale for Evaluation and Treatment: Rehabilitation  SUBJECTIVE:  SUBJECTIVE STATEMENT: Pt reports no acute changes since last visit.  Pt asking about scheduling more PT/OT as she was not able to get in 2x/week next week.   From initial eval: Pt familiar to this clinic, last seen Oct-Nov 2024 but has been seen for multiple POCs since her initial injury in 2022. Pt returns to this clinic after being in [place]  for the past few months. While in [place]  in December 2024 patient had a fall where she slid forwards out of her wheelchair onto the floor, ended up fracturing both of her femurs. Pt was hospitalized for 10-11 days and had surgical repair of her femurs. Pt reports she has been cleared of all restrictions since surgery, does have ongoing swelling in both legs and worsened spasticity. Pt reports that the spasticity has slightly improved since it initially started, was told by Dr. Lovorn the swelling may not resolve for 6-8 months (it has been 3 months) and not sure if spasticity will resolve but patient is hopeful that if her swelling improves her spasticity  will improve as well.  Pt asking about other options to help manage the swelling her legs, has tried variable compression stockings (knee high) and her PTs in [place]  recommended tight shapewear. Pt reports that the knee-high compression stockings led to increased swelling in her knees and the shapewear did not help her swelling. Encouraged patient to get thigh-high compression stockings and elevate her LE in PWC. Pt has ordered an articulating bed that will get here next Monday (3/31) so she can better elevate her legs when in bed.  Pt is also concerned about decreased ROM in her legs, [name] works on stretching her legs frequently but she feels she has more motion in her LLE as compared to RLE and may have mild foot drop on her R side. Pt does have PRAFOs to wear at night. Pt has worked up to standing in her standing frame x 30-40 min at a time. Pt also worked on standing with her PT and in // bars in [place] . Pt also reports with her injuries she lost the ability to lock/unlock her R knee but that it is getting better. She reports she lost a lot of stamina during her hospital stay as well.  Pt also has a new wound since last seen in this clinic in her L gluteal fold, shearing injury. Pt was seeing wound care in [place]  and is scheduled to see wound care with [facility] early April (was not able to schedule with [facility] wound care until late April).  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 Yes: NPRS scale: 3-4 Pain location: elbows to fingertips on both arms Pain description: nerve pain Aggravating factors: not stated Relieving factors: not stated  PRECAUTIONS: Fall and Other: osteoporosis  RED FLAGS: None   WEIGHT BEARING RESTRICTIONS: No  FALLS: Has patient fallen in last 6 months? Yes. Number of falls 1 fall in [place]  that resulted in B femur fractures  LIVING  ENVIRONMENT: Lives with: lives with their spouse and and with full-time caregiver [name] Lives in: House/apartment Home is power wheelchair accessible Has following equipment at home: Wheelchair (power), Wheelchair (manual), Grab bars, Ramped entry, and standing frame, slide board  PLOF: Independent with household mobility with device, Independent with community mobility with device, Requires assistive device for independence, Needs assistance with ADLs, and Needs assistance with  transfers  PATIENT GOALS: "still working on stand and pivot with goal to pivot to commode or to a chair" "work on core-will help me with standing" "improve my stamina - being in the hospital I lost strength/endurance"   OBJECTIVE:  Note: Objective measures were completed at Evaluation unless otherwise noted.  DIAGNOSTIC FINDINGS: None update/relevant to this POC  COGNITION: Overall cognitive status: Within functional limits for tasks assessed   SENSATION: Decreased sensation in BUE and BLE secondary to incomplete quadriplegia Decreased sensation in proximal LLE as compared to distal LE  EDEMA:  Circumferential: R knee: 17"; L knee: 17.5" and Figure 8: R ankle 21", L ankle 21.5"  MUSCLE TONE: increased spasticity in BLE   POSTURE: rounded shoulders and forward head  LOWER EXTREMITY ROM:     Passive  Right Eval Left Eval  Hip flexion Tight hip flexors Tight hip flexors  Hip extension    Hip abduction    Hip adduction    Hip internal rotation    Hip external rotation    Knee flexion Tight HS Tight HS  Knee extension    Ankle dorsiflexion Decreased, tight gastroc Decreased, tight gastroc  Ankle plantarflexion    Ankle inversion    Ankle eversion     (Blank rows = not tested)  LOWER EXTREMITY MMT:    MMT Right Eval Left Eval  Hip flexion 1 2-  Hip extension    Hip abduction    Hip adduction    Hip internal rotation    Hip external rotation    Knee flexion 0 3  Knee extension 2- 2-   Ankle dorsiflexion 2- 3  Ankle plantarflexion    Ankle inversion    Ankle eversion    (Blank rows = not tested)  BED MOBILITY:  From previous POC: Sit to supine Mod A Supine to sit Mod A Rolling to Right Mod A Rolling to Left Mod A Undulating mattress for wound management on standard bed (elevated-so often doing uphill sliding board transfers); she would like to continue working on sitting up independently, she has been working on rolling, needs less assistance w/ this when someone props her leg into hooklying; would like something to help her pull her left leg to her butt for stretching as well as bed mobility.  TRANSFERS: From previous POC: Pt continues using combination of bump over, slide board, and depression (squat pivot) transfers.                                                                                                                              TREATMENT:   Ther[employer] Pt received seated in her power wheelchair with caregiver/nurse [name]. Session focus on bump transfers power wheelchair to/from mat table with use of slide board as a bridge. Pt able to perform initial transfer PWC to downhill mat table with some assist needed to move her LLE. However, pt does have difficulty clearing her buttocks and more slides during the  transfer vs actually lifting. Pt performs transfer back to her wheelchair in similar manner. For next transfer back to mat table to patient's R side she utilizes press-up blocks to see if she can get increased buttocks lift during transfer. Pt with some difficulty pressing up with blocks this date due to fatigue and possible due to having botox  in her hands. Pt able to work on seated block press-ups from EOM table with fair clearance of her buttocks noted. Due to fatigue she is unable to utilize press-up blocks for transfer back to her wheelchair. Pt left seated in her PWC in care of [name] at end of session.   PATIENT EDUCATION: Education details: Continue  stretching program, triceps strengthening exercises (w/c push-ups, resistance band elbow extension, weighted bar elbow extension). Person educated: Patient and Arts administrator Education method: Explanation Education comprehension: verbalized understanding  HOME EXERCISE PROGRAM: Will be established as needed as pt has done continuous therapy and is working towards functional tasks.   GOALS: Goals reviewed with patient? Yes  SHORT TERM GOALS: Target date: 10/07/2023  HEP to be established for stretching and strengthening as appropriate. Baseline: not established at initial eval, reviewed during PT POC - pt and [name] report it is going well at home (4/25) Goal status: MET  2.  Pt will perform sit to stand transfer with LRAD with mod A Baseline: max A to stedy (4/4), max A in // bars (4/25) Goal status: IN PROGRESS  3.  Pt to tolerate standing x 5 min with LRAD to demonstrate improved endurance Baseline: 30 sec in stedy (initial), 2:35 min in // bars (4/25) Goal status: IN PROGRESS  4.  Pt to demonstrate reduced edema in BLE with a reduction in circumference/figure 8 measurement by 0.5" from initial evaluation. Baseline: Circumferential: R knee: 17"; L knee: 17.5" and Figure 8: R ankle 21", L ankle 21.5" 4/25: R knee 16", L knee: 16 11/16", R ankle: 21", L ankle 20" Goal status: IN PROGRESS   LONG TERM GOALS: Target date: 11/04/2023  Patient and her caregiver to be independent with performance of HEP for stretching and strengthening as appropriate. Baseline: not established at initial eval, reviewed during PT POC - pt and [name] report it is going well at home (4/25) Goal status: MET  2.  Pt will perform sit to stand transfer with LRAD with min A Baseline: max A to stedy (4/4), max A in // bars (4/25), max A in // bars (5/22) Goal status: NOT MET  3.  Pt to tolerate standing x 5 min with LRAD to demonstrate improved endurance Baseline: 30 sec in stedy (initial), 2:35 min in //  bars (4/25), 3 min in // bars (5/22) Goal status: NOT MET  4.  Pt to demonstrate reduced edema in BLE with a reduction in circumference/figure 8 measurement by 1" from initial evaluation. Baseline: Circumferential:  R knee: 17"; L knee: 17.5" and Figure 8: R ankle 21", L ankle 21.5" 4/25: R knee 16", L knee: 16 11/16", R ankle: 21", L ankle 20" 5/30: R knee: 16", L knee: 16", R ankle: 20.5", L ankle: 20" Goal status: PARTIALLY MET   NEW SHORT TERM GOALS:   Target date: 12/04/2023  Pt will perform sit to stand transfer with LRAD with mod A Baseline: max A to stedy (4/4), max A in // bars (4/25), max A in // bars (5/22) Goal status: REVISED  2.  Pt to tolerate standing x 4 min with LRAD to demonstrate improved endurance Baseline: 30 sec  in stedy (initial), 2:35 min in // bars (4/25), 3 min in // bars (5/22) Goal status: REVISED  3.  Pt will progress to performing bump transfers with mod A at the most for increased independence with functional mobility Baseline: up to max A (5/30) Goal status: INITIAL   NEW LONG TERM GOALS:  Target date: 12/26/2023  Pt will perform sit to stand transfer with LRAD with min A Baseline: max A to stedy (4/4), max A in // bars (4/25), max A in // bars (5/22) Goal status: REVISED  2.  Pt to tolerate standing x 5 min with LRAD to demonstrate improved endurance Baseline: 30 sec in stedy (initial), 2:35 min in // bars (4/25), 3 min in // bars (5/22) Goal status: REVISED  3.  Pt will progress to performing bump transfers with min A at the most for increased independence with functional mobility Baseline: up to max A (5/30) Goal status: INITIAL  4.  Pt will be able to maintain dynamic sitting balance in long-sit position x 5 min while performing a functional task with no more than mod A Baseline: max A (5/30) Goal status: INITIAL    ASSESSMENT:  CLINICAL IMPRESSION: Emphasis of skilled PT session on attempting to work on bump transfers along slide  board. Pt with difficulty clearing her buttocks during transfer due to UE weakness and fatigue. She continues to benefit from practice of this and continued strengthening of her BUE to increase her independence with transfers and decrease risk of shearing injuries from long term slide board use. Continue POC.    OBJECTIVE IMPAIRMENTS: decreased balance, decreased endurance, decreased mobility, difficulty walking, decreased ROM, decreased strength, increased edema, impaired perceived functional ability, increased muscle spasms, impaired flexibility, impaired sensation, impaired tone, impaired UE functional use, postural dysfunction, and pain.   ACTIVITY LIMITATIONS: carrying, lifting, bending, standing, stairs, transfers, bed mobility, continence, bathing, toileting, dressing, reach over head, and hygiene/grooming  PARTICIPATION LIMITATIONS: meal prep, cleaning, laundry, driving, shopping, community activity, and occupation  PERSONAL FACTORS: Age, Sex, Time since onset of injury/illness/exacerbation, and 1-2 comorbidities:   C7 ASIA C- incomplete quad w/ neurogenic bladder and bowel, HLD, Hx of skin cancerare also affecting patient's functional outcome.   REHAB POTENTIAL: Good  CLINICAL DECISION MAKING: Stable/uncomplicated  EVALUATION COMPLEXITY: High  PLAN:  PT FREQUENCY: 2x/week  PT DURATION: 8 weeks  PLANNED INTERVENTIONS: 97164- PT Re-evaluation, 97110-Therapeutic exercises, 97530- Therapeutic activity, 97112- Neuromuscular re-education, 97535- Self Care, 16109- Manual therapy, [phone]- Gait training, [phone]- Orthotic Fit/training, [phone]- Electrical stimulation (manual), Patient/Family education, Balance training, Stair training, Taping, Dry Needling, Joint mobilization, Scar mobilization, Compression bandaging, DME instructions, Wheelchair mobility training, Cryotherapy, and Moist heat  PLAN FOR NEXT SESSION: 20th PN, any questions over PROM/stretching? sit to stands, standing tolerance  with stedy, in // bars, possibly with RW, core strengthening/stability, endurance; wants to have conversation about taking a break (3-6 months) after this POC near end of this POC, standing lateral weight shifts, mini-squats in standing?, Yoga block press-ups vs push-up blocks, long-sitting - unsupported for core work?, bump transfers on slide board - pt wants to work on this skill!, tricep strengthening    Lorita Rosa, PT Lorita Rosa, PT, DPT, CSRS     11/16/2023, 10:59 AM

## 2023-11-21 DIAGNOSIS — R319 Hematuria, unspecified: Secondary | ICD-10-CM | POA: Diagnosis not present

## 2023-11-22 ENCOUNTER — Ambulatory Visit: Admitting: Physical Therapy

## 2023-11-22 ENCOUNTER — Ambulatory Visit: Payer: Self-pay | Admitting: Occupational Therapy

## 2023-11-22 ENCOUNTER — Encounter: Admitting: Occupational Therapy

## 2023-11-22 DIAGNOSIS — R29818 Other symptoms and signs involving the nervous system: Secondary | ICD-10-CM

## 2023-11-22 DIAGNOSIS — G8253 Quadriplegia, C5-C7 complete: Secondary | ICD-10-CM

## 2023-11-22 DIAGNOSIS — M24542 Contracture, left hand: Secondary | ICD-10-CM

## 2023-11-22 DIAGNOSIS — R278 Other lack of coordination: Secondary | ICD-10-CM

## 2023-11-22 DIAGNOSIS — R208 Other disturbances of skin sensation: Secondary | ICD-10-CM

## 2023-11-22 DIAGNOSIS — M25642 Stiffness of left hand, not elsewhere classified: Secondary | ICD-10-CM | POA: Diagnosis not present

## 2023-11-22 DIAGNOSIS — R293 Abnormal posture: Secondary | ICD-10-CM | POA: Diagnosis not present

## 2023-11-22 DIAGNOSIS — R29898 Other symptoms and signs involving the musculoskeletal system: Secondary | ICD-10-CM | POA: Diagnosis not present

## 2023-11-22 DIAGNOSIS — M6281 Muscle weakness (generalized): Secondary | ICD-10-CM | POA: Diagnosis not present

## 2023-11-22 DIAGNOSIS — M25641 Stiffness of right hand, not elsewhere classified: Secondary | ICD-10-CM

## 2023-11-22 DIAGNOSIS — R2689 Other abnormalities of gait and mobility: Secondary | ICD-10-CM | POA: Diagnosis not present

## 2023-11-22 DIAGNOSIS — M24541 Contracture, right hand: Secondary | ICD-10-CM

## 2023-11-22 DIAGNOSIS — G8254 Quadriplegia, C5-C7 incomplete: Secondary | ICD-10-CM

## 2023-11-22 NOTE — Therapy (Signed)
 OUTPATIENT PHYSICAL THERAPY NEURO TREATMENT - 20th VISIT PROGRESS NOTE   Patient Name: [name] MRN: [id number] DOB:16-Jun-1950, 73 y.o., female Today's Date: 11/22/2023   PCP: Carmen Capra, MD REFERRING PROVIDER: Celia Coles, MD  Physical Therapy Progress Note   Dates of Reporting Period: 10/12/2023 - 11/22/2023  See Note below for Objective Data and Assessment of Progress/Goals.  Thank you for the referral of this patient. [name], PT, DPT, CSRS   END OF SESSION:    PT End of Session - 11/22/23 1447     Visit Number 20    Number of Visits 29   recert   Date for PT Re-Evaluation 01/01/24   to allow for scheduling delays   Authorization Type [employer] MEDICARE    PT Start Time 1445    PT Stop Time 1526    PT Time Calculation (min) 41 min    Equipment Utilized During Treatment Gait belt    Activity Tolerance Patient tolerated treatment well    Behavior During Therapy [facility] for tasks assessed/performed                         Past Medical History:  Diagnosis Date   CERVICAL POLYP 03/11/2008   Qualifier: Diagnosis of  By: Carmen Henry MD, [name]    Colon polyps 2005   on colonscopy Dr. Sandrea [name]   Fibroid 2004   Per Dr. Alva [name]   History of shingles    face and mouth   Hx of skin cancer, basal cell    Rosacea    Sciatica of left side 09/28/2013   Scoliosis    noted on mri done for back pain   Past Surgical History:  Procedure Laterality Date   BUNIONECTOMY     Patient Active Problem List   Diagnosis Date Noted   Buttock wound, left, subsequent encounter 03/16/2023   Bronchiectasis with acute exacerbation (HCC) 03/15/2023   Buttock wound, left, initial encounter 11/02/2022   Orthostatic hypotension 08/13/2022   Neurogenic bowel 05/03/2022   Spasticity 05/03/2022   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/03/2022   Nerve pain 05/03/2022   Medication monitoring encounter 01/08/2022   Neurogenic bladder 10/11/2021   Urinary incontinence 10/11/2021    ESBL (extended spectrum beta-lactamase) producing bacteria infection 10/09/2021   Recurrent UTI 10/09/2021   Quadriplegia, C5-C7 incomplete (HCC) 01/16/2021   History of spinal fracture 01/16/2021   Suprapubic catheter (HCC) 01/16/2021   Encounter for routine gynecological examination 09/28/2013   Onychomycosis 09/28/2013   Foot deformity, acquired 03/26/2012   Encounter for preventive health examination 12/25/2010   ROSACEA 08/25/2009   Disturbance in sleep behavior 03/11/2008   SKIN CANCER, HX OF 03/11/2008   DYSURIA, HX OF 03/11/2008   Hyperlipidemia 02/10/2007   CERVICALGIA 02/10/2007    ONSET DATE: 08/29/2023 (referral date)  REFERRING DIAG: G82.54 (ICD-10-CM) - Incomplete quadriplegia at C5-C8 level (HCC)  THERAPY DIAG:  Muscle weakness (generalized)  Other symptoms and signs involving the nervous system  Other symptoms and signs involving the musculoskeletal system  Abnormal posture  Quadriplegia, C5-C7 incomplete (HCC)  Rationale for Evaluation and Treatment: Rehabilitation  SUBJECTIVE:  SUBJECTIVE STATEMENT: Pt reports no acute changes since last visit.   From initial eval: Pt familiar to this clinic, last seen Oct-Nov 2024 but has been seen for multiple POCs since her initial injury in 2022. Pt returns to this clinic after being in [place]  for the past few months. While in [place]  in December 2024 patient had a fall where she slid forwards out of her wheelchair onto the floor, ended up fracturing both of her femurs. Pt was hospitalized for 10-11 days and had surgical repair of her femurs. Pt reports she has been cleared of all restrictions since surgery, does have ongoing swelling in both legs and worsened spasticity. Pt reports that the spasticity has slightly improved since it  initially started, was told by [name] the swelling may not resolve for 6-8 months (it has been 3 months) and not sure if spasticity will resolve but patient is hopeful that if her swelling improves her spasticity will improve as well.  Pt asking about other options to help manage the swelling her legs, has tried variable compression stockings (knee high) and her PTs in [place]  recommended tight shapewear. Pt reports that the knee-high compression stockings led to increased swelling in her knees and the shapewear did not help her swelling. Encouraged patient to get thigh-high compression stockings and elevate her LE in PWC. Pt has ordered an articulating bed that will get here next Monday (3/31) so she can better elevate her legs when in bed.  Pt is also concerned about decreased ROM in her legs, [name] works on stretching her legs frequently but she feels she has more motion in her LLE as compared to RLE and may have mild foot drop on her R side. Pt does have PRAFOs to wear at night. Pt has worked up to standing in her standing frame x 30-40 min at a time. Pt also worked on standing with her PT and in // bars in [place] . Pt also reports with her injuries she lost the ability to lock/unlock her R knee but that it is getting better. She reports she lost a lot of stamina during her hospital stay as well.  Pt also has a new wound since last seen in this clinic in her L gluteal fold, shearing injury. Pt was seeing wound care in [place]  and is scheduled to see wound care with [facility] early April (was not able to schedule with [facility] wound care until late April).  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 Yes: NPRS scale: 3-4 Pain location: elbows to fingertips on both arms Pain description: nerve pain Aggravating factors: not stated Relieving factors: not stated  PRECAUTIONS:  Fall and Other: osteoporosis  RED FLAGS: None   WEIGHT BEARING RESTRICTIONS: No  FALLS: Has patient fallen in last 6 months? Yes. Number of falls 1 fall in [place]  that resulted in B femur fractures  LIVING ENVIRONMENT: Lives with: lives with their spouse and and with full-time caregiver [name] Lives in: House/apartment Home is power wheelchair accessible Has following equipment at home: Wheelchair (power), Wheelchair (manual), Grab bars, Ramped entry, and standing frame, slide board  PLOF: Independent with household mobility with device, Independent with community mobility with device, Requires assistive device for independence, Needs assistance with ADLs, and Needs assistance with transfers  PATIENT GOALS: "still working on stand and pivot with goal to pivot to commode or to  a chair" "work on core-will help me with standing" "improve my stamina - being in the hospital I lost strength/endurance"   OBJECTIVE:  Note: Objective measures were completed at Evaluation unless otherwise noted.  DIAGNOSTIC FINDINGS: None update/relevant to this POC  COGNITION: Overall cognitive status: Within functional limits for tasks assessed   SENSATION: Decreased sensation in BUE and BLE secondary to incomplete quadriplegia Decreased sensation in proximal LLE as compared to distal LE  EDEMA:  Circumferential: R knee: 17"; L knee: 17.5" and Figure 8: R ankle 21", L ankle 21.5"  MUSCLE TONE: increased spasticity in BLE   POSTURE: rounded shoulders and forward head  LOWER EXTREMITY ROM:     Passive  Right Eval Left Eval  Hip flexion Tight hip flexors Tight hip flexors  Hip extension    Hip abduction    Hip adduction    Hip internal rotation    Hip external rotation    Knee flexion Tight HS Tight HS  Knee extension    Ankle dorsiflexion Decreased, tight gastroc Decreased, tight gastroc  Ankle plantarflexion    Ankle inversion    Ankle eversion     (Blank rows = not tested)  LOWER  EXTREMITY MMT:    MMT Right Eval Left Eval  Hip flexion 1 2-  Hip extension    Hip abduction    Hip adduction    Hip internal rotation    Hip external rotation    Knee flexion 0 3  Knee extension 2- 2-  Ankle dorsiflexion 2- 3  Ankle plantarflexion    Ankle inversion    Ankle eversion    (Blank rows = not tested)  BED MOBILITY:  From previous POC: Sit to supine Mod A Supine to sit Mod A Rolling to Right Mod A Rolling to Left Mod A Undulating mattress for wound management on standard bed (elevated-so often doing uphill sliding board transfers); she would like to continue working on sitting up independently, she has been working on rolling, needs less assistance w/ this when someone props her leg into hooklying; would like something to help her pull her left leg to her butt for stretching as well as bed mobility.  TRANSFERS: From previous POC: Pt continues using combination of bump over, slide board, and depression (squat pivot) transfers.                                                                                                                              TREATMENT:   TherAct Pt received seated in her power wheelchair with caregiver/nurse [name]. Session focus on bump transfers power wheelchair to/from mat table with use of slide board as a bridge. Pt able to perform initial transfer PWC to downhill mat table with some assist needed to move her LLE. However, pt does have difficulty clearing her buttocks and more slides during the transfer vs actually lifting. Pt performs transfer back to her wheelchair in similar manner.   Sit to  supine with mod A for BLE management. Supine to sit max A for BLE management and assist with lifting up trunk.  TherEx To work on B tricep strengthening in order to increase her ability to lift her body and clear her bottom with bump transfers: Seated lateral leans onto lower mat table x 10 reps B Seated shoulder extensions resisted with yellow  TB x 10 reps B Seated resisted tricep extension with yellow TB x 5 reps B, feels more in her biceps Seated wheelchair push-ups/tricep dips x 5 reps Supine elbow extension x 10 reps  Added to HEP, see bolded below     PATIENT EDUCATION: Education details: Continue stretching program, triceps strengthening exercises (w/c push-ups, resistance band elbow extension, seated lateral leans, supine triceps extension against gravity). Person educated: Patient and Arts administrator Education method: Explanation, Demonstration, Tactile cues, Verbal cues, and Handouts Education comprehension: verbalized understanding and returned demonstration  HOME EXERCISE PROGRAM: Access Code: [email or web address] URL: [email or web address] Date: 11/22/2023 Prepared by: [name]  Exercises - Seated Elbow Extension with Self-Anchored Resistance  - 1 x daily - 7 x weekly - 3 sets - 10 reps - Supine Elbow Flexion Extension with Dumbbell  - 1 x daily - 7 x weekly - 3 sets - 10 reps - Seated Single Arm Elbow Extension Push-up on Table  - 1 x daily - 7 x weekly - 3 sets - 10 reps - Wheelchair Push-Up (AKA)  - 1 x daily - 7 x weekly - 3 sets - 5 reps  GOALS: Goals reviewed with patient? Yes  SHORT TERM GOALS: Target date: 10/07/2023  HEP to be established for stretching and strengthening as appropriate. Baseline: not established at initial eval, reviewed during PT POC - pt and [name] report it is going well at home (4/25) Goal status: MET  2.  Pt will perform sit to stand transfer with LRAD with mod A Baseline: max A to stedy (4/4), max A in // bars (4/25) Goal status: IN PROGRESS  3.  Pt to tolerate standing x 5 min with LRAD to demonstrate improved endurance Baseline: 30 sec in stedy (initial), 2:35 min in // bars (4/25) Goal status: IN PROGRESS  4.  Pt to demonstrate reduced edema in BLE with a reduction in circumference/figure 8 measurement by 0.5" from initial evaluation. Baseline:  Circumferential: R knee: 17"; L knee: 17.5" and Figure 8: R ankle 21", L ankle 21.5" 4/25: R knee 16", L knee: 16 11/16", R ankle: 21", L ankle 20" Goal status: IN PROGRESS   LONG TERM GOALS: Target date: 11/04/2023  Patient and her caregiver to be independent with performance of HEP for stretching and strengthening as appropriate. Baseline: not established at initial eval, reviewed during PT POC - pt and [name] report it is going well at home (4/25) Goal status: MET  2.  Pt will perform sit to stand transfer with LRAD with min A Baseline: max A to stedy (4/4), max A in // bars (4/25), max A in // bars (5/22) Goal status: NOT MET  3.  Pt to tolerate standing x 5 min with LRAD to demonstrate improved endurance Baseline: 30 sec in stedy (initial), 2:35 min in // bars (4/25), 3 min in // bars (5/22) Goal status: NOT MET  4.  Pt to demonstrate reduced edema in BLE with a reduction in circumference/figure 8 measurement by 1" from initial evaluation. Baseline: Circumferential:  R knee: 17"; L knee: 17.5" and Figure 8: R ankle 21", L ankle 21.5" 4/25: R knee  16", L knee: 16 11/16", R ankle: 21", L ankle 20" 5/30: R knee: 16", L knee: 16", R ankle: 20.5", L ankle: 20" Goal status: PARTIALLY MET   NEW SHORT TERM GOALS:   Target date: 12/04/2023  Pt will perform sit to stand transfer with LRAD with mod A Baseline: max A to stedy (4/4), max A in // bars (4/25), max A in // bars (5/22) Goal status: REVISED  2.  Pt to tolerate standing x 4 min with LRAD to demonstrate improved endurance Baseline: 30 sec in stedy (initial), 2:35 min in // bars (4/25), 3 min in // bars (5/22) Goal status: REVISED  3.  Pt will progress to performing bump transfers with mod A at the most for increased independence with functional mobility Baseline: up to max A (5/30) Goal status: INITIAL   NEW LONG TERM GOALS:  Target date: 12/26/2023  Pt will perform sit to stand transfer with LRAD with min A Baseline: max A  to stedy (4/4), max A in // bars (4/25), max A in // bars (5/22) Goal status: REVISED  2.  Pt to tolerate standing x 5 min with LRAD to demonstrate improved endurance Baseline: 30 sec in stedy (initial), 2:35 min in // bars (4/25), 3 min in // bars (5/22) Goal status: REVISED  3.  Pt will progress to performing bump transfers with min A at the most for increased independence with functional mobility Baseline: up to max A (5/30) Goal status: INITIAL  4.  Pt will be able to maintain dynamic sitting balance in long-sit position x 5 min while performing a functional task with no more than mod A Baseline: max A (5/30) Goal status: INITIAL    ASSESSMENT:  CLINICAL IMPRESSION: Emphasis of skilled PT session on continuing to work on bump transfers along slide board along with reviewing triceps strengthening exercises. Pt is able to perform all 4 prescribed exercises this session and feels soreness in her triceps after working on them, added to HEP. She continues to benefit from strengthening of these muscles in order to increase her ability to clear her buttocks during transfers in order to increase her independence with transfers and decrease risk of shearing injuries from long term slide board use. Continue POC.    OBJECTIVE IMPAIRMENTS: decreased balance, decreased endurance, decreased mobility, difficulty walking, decreased ROM, decreased strength, increased edema, impaired perceived functional ability, increased muscle spasms, impaired flexibility, impaired sensation, impaired tone, impaired UE functional use, postural dysfunction, and pain.   ACTIVITY LIMITATIONS: carrying, lifting, bending, standing, stairs, transfers, bed mobility, continence, bathing, toileting, dressing, reach over head, and hygiene/grooming  PARTICIPATION LIMITATIONS: meal prep, cleaning, laundry, driving, shopping, community activity, and occupation  PERSONAL FACTORS: Age, Sex, Time since onset of  injury/illness/exacerbation, and 1-2 comorbidities:   C7 ASIA C- incomplete quad w/ neurogenic bladder and bowel, HLD, Hx of skin cancerare also affecting patient's functional outcome.   REHAB POTENTIAL: Good  CLINICAL DECISION MAKING: Stable/uncomplicated  EVALUATION COMPLEXITY: High  PLAN:  PT FREQUENCY: 2x/week  PT DURATION: 8 weeks  PLANNED INTERVENTIONS: 97164- PT Re-evaluation, 97110-Therapeutic exercises, 97530- Therapeutic activity, 97112- Neuromuscular re-education, 97535- Self Care, 16109- Manual therapy, [phone]- Gait training, [phone]- Orthotic Fit/training, [phone]- Electrical stimulation (manual), Patient/Family education, Balance training, Stair training, Taping, Dry Needling, Joint mobilization, Scar mobilization, Compression bandaging, DME instructions, Wheelchair mobility training, Cryotherapy, and Moist heat  PLAN FOR NEXT SESSION: any questions over tricep HEP? sit to stands, standing tolerance with stedy, in // bars, possibly with RW, core strengthening/stability, endurance;  wants to have conversation about taking a break (3-6 months) after this POC near end of this POC, standing lateral weight shifts, mini-squats in standing?, Yoga block press-ups vs push-up blocks, long-sitting - unsupported for core work?, bump transfers on slide board - pt wants to work on this skill!, lateral leans   [name], PT [name], PT, DPT, CSRS     11/22/2023, 3:26 PM

## 2023-11-22 NOTE — Therapy (Signed)
 OUTPATIENT OCCUPATIONAL THERAPY NEURO TREATMENT   Patient Name: [name] MRN: [id number] DOB:06/01/1951, 73 y.o., female Today's Date: 11/22/2023  PCP: Reginal Capra, MD  REFERRING PROVIDER: Celia Coles, MD  END OF SESSION:  OT End of Session - 11/22/23 1733     Visit Number 16    Number of Visits 32    Date for OT Re-Evaluation 01/27/24    Authorization Type [employer] - re-auth submitted    Authorization Time Period 09/09/2023 - 11/04/2023    OT Start Time 1533    OT Stop Time 1615    OT Time Calculation (min) 42 min    Equipment Utilized During Treatment --    Activity Tolerance Patient tolerated treatment well    Behavior During Therapy [facility] for tasks assessed/performed            Past Medical History:  Diagnosis Date   CERVICAL POLYP 03/11/2008   Qualifier: Diagnosis of  By: Ethel Henry MD, Joaquim Muir    Colon polyps 2005   on colonscopy Dr. Sandrea Cruel   Fibroid 2004   Per Dr. Alva Jewels   History of shingles    face and mouth   Hx of skin cancer, basal cell    Rosacea    Sciatica of left side 09/28/2013   Scoliosis    noted on mri done for back pain   Past Surgical History:  Procedure Laterality Date   BUNIONECTOMY     Patient Active Problem List   Diagnosis Date Noted   Buttock wound, left, subsequent encounter 03/16/2023   Bronchiectasis with acute exacerbation (HCC) 03/15/2023   Buttock wound, left, initial encounter 11/02/2022   Orthostatic hypotension 08/13/2022   Neurogenic bowel 05/03/2022   Spasticity 05/03/2022   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/03/2022   Nerve pain 05/03/2022   Medication monitoring encounter 01/08/2022   Neurogenic bladder 10/11/2021   Urinary incontinence 10/11/2021   ESBL (extended spectrum beta-lactamase) producing bacteria infection 10/09/2021   Recurrent UTI 10/09/2021   Quadriplegia, C5-C7 incomplete (HCC) 01/16/2021   History of spinal fracture 01/16/2021   Suprapubic catheter (HCC) 01/16/2021   Encounter for routine  gynecological examination 09/28/2013   Onychomycosis 09/28/2013   Foot deformity, acquired 03/26/2012   Encounter for preventive health examination 12/25/2010   ROSACEA 08/25/2009   Disturbance in sleep behavior 03/11/2008   SKIN CANCER, HX OF 03/11/2008   DYSURIA, HX OF 03/11/2008   Hyperlipidemia 02/10/2007   CERVICALGIA 02/10/2007    ONSET DATE: 07/28/2020  Date of Referral 08/29/2023   REFERRING DIAG: G82.54 (ICD-10-CM) - Quadriplegia, C5-C8, incomplete  THERAPY DIAG:  Muscle weakness (generalized)  Other symptoms and signs involving the nervous system  Other symptoms and signs involving the musculoskeletal system  Other lack of coordination  Stiffness of right hand, not elsewhere classified  Other disturbances of skin sensation  Contracture of hand joint, left  Contracture of hand joint, right  Quadriplegia, C5-C7 complete (HCC)  Rationale for Evaluation and Treatment: Rehabilitation  SUBJECTIVE:   SUBJECTIVE STATEMENT: Pt reports difficulty using new L ergonomic mouse.    Pt accompanied by: Caregiver, [name]   PERTINENT HISTORY: "Pt is a 73 yr old L handed female with hx of incomplete quadriplegia- 2/14 2022- fleeing the police in [place] on passenger 100 (high speed) miles/hour,  [name]; neurogenic bowel and bladder and spasticity; [name], [name] Here for f/u on Incomplete quadriplegia"  B femur fractures December, 2024.  PRECAUTIONS: Fall; suprapubic catheter (she wants to get this removed meaning  she needs to get to and from the toilet); she has had minor heat sensation when needing to complete her bowel program-possible AD?   WEIGHT BEARING RESTRICTIONS: No  PAIN: - reports average pain as noted below. Will notify therapist if there are changes in her pain.  Are you having pain? Yes: NPRS scale: 3/10 Pain location: fingers to elbow bilaterally Pain description: constant Aggravating factors: it can increased over time ie) is  worse at the end of the day.  Also cold affects cramps and function. Relieving factors: gabapentin  and baclofen  for spasms, nightly stretching  FALLS: Has patient fallen in last 6 months? Yes. Number of falls 1  LIVING ENVIRONMENT: Lives with: lives with their family - husband [name] and with an adult companion s/p moving back up from [place]  x10 months Lives in: House/apartment Stairs: 4 story town house with an Engineer, structural with threshold adjustments, roll in shower with transport chair Has following equipment at home: Wheelchair (power) - with seat height adjustments to access counters and reclining option, Wheelchair (manual), transport WC, shower chair, and Ramped entry, handheld showerhead with rails around toilet, had Alen Amy but is no longer in need of it, has slide boards x3  PLOF: Requires assistive device for independence, Needs assistance with ADLs, Needs assistance with homemaking, Needs assistance with gait, and Needs assistance with transfers; 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  Used to like to knit, sew and bake.  PATIENT GOALS: improve spasticity and use of hands  OBJECTIVE:   HAND DOMINANCE: Left  ADLs: Overall ADLs: Patient has a live in caregiver  Transfers/ambulation related to ADLs: min assist with sliding board transfers.  Eating: Has a rocker knife that she can use. Used to use adapted utensils but now uses regular utensils but still will get assistance to cut food ie) when eating out.  Grooming: can brush her own hair with LUE only; unable to manage jewelry ie) earrings  UB Dressing: can zip/unzip after it has been started, unable to manage buttons herself, Caregiver assists but if she has extra time, she can put on her bra, and a loose fitting pullover shirt/t-shirt  LB Dressing: dependent for LB dressing in bed and with special sock donner for LE compression garments   Toileting: bladder trained with suprapubic catheter which she clamps off.   Dependent for bowel incontinence care.  Bathing: Sponge bath with adult washclothes.  Can bathe UB with back scrubber for most of her back.  Needs help with feet (mentioned she might need a separate brush for feet)   Tub Shower transfers: Min assist with slide board to wheel in shower chair  Equipment: Shower seat with back, Walk in shower, bed side commode, Reacher, Sock aid, Long handled sponge, and Feeding equipment  IADLs: --  Shopping: Assisted by caregiver  Light housekeeping: Has housekeeper that comes monthly  Meal Prep: previously enjoyed baking. Assisted by caregiver but has reheated a meal for herself after getting food out of the fridge/freezer from her WC.  Community mobility: Dependent  Medication management: Caregiver sorts them into pillbox but she is very aware of her medications   Financial management: Patient manages her own finances  Handwriting: Increased time and has a pen with a little grip  MOBILITY STATUS: Independent with power mobility  ACTIVITY TOLERANCE: Activity tolerance: good to Fair - MMT WFL but has limited sustained tolerance for ongoing use of Ues with poor trunk control  FUNCTIONAL OUTCOME MEASURES:  PSFS: 3.3 total score  10/12/2023:  3.7 total score   Total score = sum of the activity scores/number of activities Minimum detectable change (90%CI) for average score = 2 points Minimum detectable change (90%CI) for single activity score = 3 points   UPPER EXTREMITY ROM:   AROM - WFL without obvious contractures, some digital flexion noted but PROM WNL   UPPER EXTREMITY MMT:   Grossly WFL - Endurance limited R tricep strength > than L but L UE generally stronger than R UE  MMT Right (eval) Left (eval)  Shoulder flexion 4/5 4/5  Shoulder abduction 4/5 4/5  Elbow flexion 4/5 4/5  Elbow extension 4/5 4/5  (Blank rows = not tested)  HAND FUNCTION: Grip strength: Right: 4.4 lbs (decline) ; Left: 18 lbs (slight  improvement)  COORDINATION: 09/29/23 s/p Botox  injections yesterday  Left: 56.13 sec Right 3:39.58 min  SENSATION: Light touch: Impaired  - patient   EDEMA: NA for UEs but LE has poor lymph drainage with custom compression garments   MUSCLE TONE: Generally WFL   COGNITION: Overall cognitive status: Within functional limits for tasks assessed  VISION: Subjective report: Patent wears progressive lens/glasses.  Denies diplopia or vision changes. Baseline vision: Wears glasses all the time  VISION ASSESSMENT: WFL  OBSERVATIONS: Patient independent with power WC navigation within clinic.  Patient is well-kept with foley catheter in place.  She has slight limitations in full extension of digits but PROM is WNL.    TODAY'S TREATMENT:                                                                                            Therapeutic Activities OT and pt explored keyboard and ergonomic mouse options for pt's computer work tasks. OT changed mouse settings to improve accuracy with use as well as added foam to mouse keys for tactile markers as needed for finger positioning. Improved accuracy noted though pt encouraged to continue practice. Pt demonstrating good use of wireless keyboard.   PATIENT EDUCATION: Education details: Armed forces operational officer Person educated: Patient and Caregiver - Programmer, systems Education method: Explanation, Demonstration, and Verbal cues Education comprehension: verbalized understanding, returned demonstration, verbal cues required, and needs further education  HOME EXERCISE PROGRAM: Previously issued HEP per DC 12/02/22: All previous HEPs combined to 1 complete List through [employer] Access Code: [email or web address] 10/10/2023: Alternative [employer] application guidance  GOALS:   SHORT TERM GOALS: Target date: 12/22/2023   1. Patient will verbalize understanding of AE/modified techniques to improve independence and safety with ADL and IADL completion. Baseline: Caregiver/spouse  assist Goal status: IN PROGRESS  2.  Pt will be independent with BUE braces/splints as needed to prevent contracture and improve functional use of hands.  Baseline: Caregiver/spouse assist Goal status: MET  LONG TERM GOALS: Target date: 01/27/24   Patient will demonstrate independence with updated HEP for UE strengthening, coordination and ROM to prevent contractures and maintain strength for transfers and ADLs. Baseline: Previous HEPs have been established but need to be reviewed and updated.  Goal status: IN Progress  2.  Patient will report at least two-point increase in average PSFS score or at least three-point increase in a single activity score  indicating functionally significant improvement given minimum detectable change. Baseline: 3.3 total score (See above for individual activity scores)  Goal status: IN Progress  3.  Patient will demonstrate at least 10 lbs R grip strength as needed to open jars and other containers. Baseline: 4.4 lbs 09/28/23: Botox  injections  10/12/2023: R - 1.7 lbs; L - 4.1, 7.4 lbs Goal status: IN PROGRESS  ASSESSMENT:  CLINICAL IMPRESSION: Patient responding well to computer adaptions though will require additional practice. May also require further adaptions as needed to improve accuracy for work proficiency. Recommend extension of skilled OT services to address adaptive needs and manage B hand spasticity following Botox  injections.   PERFORMANCE DEFICITS: in functional skills including ADLs, IADLs, coordination, dexterity, strength, muscle spasms, Fine motor control, Gross motor control, continence, skin integrity, and UE functional use,   IMPAIRMENTS: are limiting patient from ADLs, IADLs, work, and leisure.   CO-MORBIDITIES: has co-morbidities such as incontinence and wound that affects occupational performance. Patient will benefit from skilled OT to address above impairments and improve overall function.  REHAB POTENTIAL: Fair due to chronicity  of injury  PLAN:  OT FREQUENCY: 1-2x/week   OT DURATION: Additional 8 weeks  PLANNED INTERVENTIONS: self care/ADL training, therapeutic exercise, therapeutic activity, neuromuscular re-education, manual therapy, passive range of motion, balance training, functional mobility training, splinting, patient/family education, energy conservation, coping strategies training, and DME and/or AE instructions  RECOMMENDED OTHER SERVICES: Patient was seen for PT evaluation today with treatment plans coordinated for 2x/week.  CONSULTED AND AGREED WITH PLAN OF CARE: Patient and family member/caregiver  PLAN FOR NEXT SESSION:   NMES review second unit reciprocal option PRN  Review computer adaptions PRN - cat tongue to mouse keys vs foam?  Check splints PRN Review/progress HEPs Explore FM tasks and establish weight shifting instruction for pressure relief.  Cutting, drink opening, hair  other ADL AE (jar opener)  Altamease Asters, OT 11/22/2023, 5:42 PM

## 2023-11-23 ENCOUNTER — Ambulatory Visit: Payer: Self-pay | Admitting: Physical Therapy

## 2023-11-23 DIAGNOSIS — R208 Other disturbances of skin sensation: Secondary | ICD-10-CM | POA: Diagnosis not present

## 2023-11-23 DIAGNOSIS — R29898 Other symptoms and signs involving the musculoskeletal system: Secondary | ICD-10-CM | POA: Diagnosis not present

## 2023-11-23 DIAGNOSIS — M6281 Muscle weakness (generalized): Secondary | ICD-10-CM | POA: Diagnosis not present

## 2023-11-23 DIAGNOSIS — R293 Abnormal posture: Secondary | ICD-10-CM

## 2023-11-23 DIAGNOSIS — M25642 Stiffness of left hand, not elsewhere classified: Secondary | ICD-10-CM | POA: Diagnosis not present

## 2023-11-23 DIAGNOSIS — R29818 Other symptoms and signs involving the nervous system: Secondary | ICD-10-CM

## 2023-11-23 DIAGNOSIS — M25641 Stiffness of right hand, not elsewhere classified: Secondary | ICD-10-CM | POA: Diagnosis not present

## 2023-11-23 DIAGNOSIS — R2689 Other abnormalities of gait and mobility: Secondary | ICD-10-CM | POA: Diagnosis not present

## 2023-11-23 DIAGNOSIS — R278 Other lack of coordination: Secondary | ICD-10-CM | POA: Diagnosis not present

## 2023-11-23 DIAGNOSIS — G8254 Quadriplegia, C5-C7 incomplete: Secondary | ICD-10-CM

## 2023-11-23 NOTE — Therapy (Signed)
 OUTPATIENT PHYSICAL THERAPY NEURO TREATMENT   Patient Name: [name] MRN: [id number] DOB:1950-12-28, 73 y.o., female Today's Date: 11/23/2023   PCP: Reginal Capra, MD REFERRING PROVIDER: Celia Coles, MD   END OF SESSION:    PT End of Session - 11/23/23 1146     Visit Number 21    Number of Visits 29   recert   Date for PT Re-Evaluation 01/01/24   to allow for scheduling delays   Authorization Type [employer] MEDICARE    PT Start Time 1145    PT Stop Time 1230    PT Time Calculation (min) 45 min    Equipment Utilized During Treatment Gait belt    Activity Tolerance Patient tolerated treatment well    Behavior During Therapy [facility] for tasks assessed/performed                          Past Medical History:  Diagnosis Date   CERVICAL POLYP 03/11/2008   Qualifier: Diagnosis of  By: Ethel Henry MD, Joaquim Muir    Colon polyps 2005   on colonscopy Dr. Sandrea Cruel   Fibroid 2004   Per Dr. Alva Jewels   History of shingles    face and mouth   Hx of skin cancer, basal cell    Rosacea    Sciatica of left side 09/28/2013   Scoliosis    noted on mri done for back pain   Past Surgical History:  Procedure Laterality Date   BUNIONECTOMY     Patient Active Problem List   Diagnosis Date Noted   Buttock wound, left, subsequent encounter 03/16/2023   Bronchiectasis with acute exacerbation (HCC) 03/15/2023   Buttock wound, left, initial encounter 11/02/2022   Orthostatic hypotension 08/13/2022   Neurogenic bowel 05/03/2022   Spasticity 05/03/2022   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/03/2022   Nerve pain 05/03/2022   Medication monitoring encounter 01/08/2022   Neurogenic bladder 10/11/2021   Urinary incontinence 10/11/2021   ESBL (extended spectrum beta-lactamase) producing bacteria infection 10/09/2021   Recurrent UTI 10/09/2021   Quadriplegia, C5-C7 incomplete (HCC) 01/16/2021   History of spinal fracture 01/16/2021   Suprapubic catheter (HCC) 01/16/2021   Encounter for  routine gynecological examination 09/28/2013   Onychomycosis 09/28/2013   Foot deformity, acquired 03/26/2012   Encounter for preventive health examination 12/25/2010   ROSACEA 08/25/2009   Disturbance in sleep behavior 03/11/2008   SKIN CANCER, HX OF 03/11/2008   DYSURIA, HX OF 03/11/2008   Hyperlipidemia 02/10/2007   CERVICALGIA 02/10/2007    ONSET DATE: 08/29/2023 (referral date)  REFERRING DIAG: G82.54 (ICD-10-CM) - Incomplete quadriplegia at C5-C8 level (HCC)  THERAPY DIAG:  Muscle weakness (generalized)  Other symptoms and signs involving the nervous system  Other symptoms and signs involving the musculoskeletal system  Abnormal posture  Quadriplegia, C5-C7 incomplete (HCC)  Rationale for Evaluation and Treatment: Rehabilitation  SUBJECTIVE:  SUBJECTIVE STATEMENT: Pt reports no acute changes since last visit. Pt has some soreness in her arms after yesterday, reviewed all exercises with [name] at home, no questions.   From initial eval: Pt familiar to this clinic, last seen Oct-Nov 2024 but has been seen for multiple POCs since her initial injury in 2022. Pt returns to this clinic after being in [place]  for the past few months. While in [place]  in December 2024 patient had a fall where she slid forwards out of her wheelchair onto the floor, ended up fracturing both of her femurs. Pt was hospitalized for 10-11 days and had surgical repair of her femurs. Pt reports she has been cleared of all restrictions since surgery, does have ongoing swelling in both legs and worsened spasticity. Pt reports that the spasticity has slightly improved since it initially started, was told by Dr. Lovorn the swelling may not resolve for 6-8 months (it has been 3 months) and not sure if spasticity will resolve but  patient is hopeful that if her swelling improves her spasticity will improve as well.  Pt asking about other options to help manage the swelling her legs, has tried variable compression stockings (knee high) and her PTs in [place]  recommended tight shapewear. Pt reports that the knee-high compression stockings led to increased swelling in her knees and the shapewear did not help her swelling. Encouraged patient to get thigh-high compression stockings and elevate her LE in PWC. Pt has ordered an articulating bed that will get here next Monday (3/31) so she can better elevate her legs when in bed.  Pt is also concerned about decreased ROM in her legs, [name] works on stretching her legs frequently but she feels she has more motion in her LLE as compared to RLE and may have mild foot drop on her R side. Pt does have PRAFOs to wear at night. Pt has worked up to standing in her standing frame x 30-40 min at a time. Pt also worked on standing with her PT and in // bars in [place] . Pt also reports with her injuries she lost the ability to lock/unlock her R knee but that it is getting better. She reports she lost a lot of stamina during her hospital stay as well.  Pt also has a new wound since last seen in this clinic in her L gluteal fold, shearing injury. Pt was seeing wound care in [place]  and is scheduled to see wound care with [facility] early April (was not able to schedule with [facility] wound care until late April).  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 Yes: NPRS scale: 3-4 Pain location: elbows to fingertips on both arms Pain description: nerve pain Aggravating factors: not stated Relieving factors: not stated  PRECAUTIONS: Fall and Other: osteoporosis  RED FLAGS: None   WEIGHT BEARING RESTRICTIONS: No  FALLS: Has patient fallen in last 6 months? Yes. Number of falls  1 fall in [place]  that resulted in B femur fractures  LIVING ENVIRONMENT: Lives with: lives with their spouse and and with full-time caregiver [name] Lives in: House/apartment Home is power wheelchair accessible Has following equipment at home: Wheelchair (power), Wheelchair (manual), Grab bars, Ramped entry, and standing frame, slide board  PLOF: Independent with household mobility with device, Independent with community mobility with device, Requires assistive device for independence, Needs assistance with ADLs, and Needs assistance with  transfers  PATIENT GOALS: still working on stand and pivot with goal to pivot to commode or to a chair work on core-will help me with standing improve my stamina - being in the hospital I lost strength/endurance   OBJECTIVE:  Note: Objective measures were completed at Evaluation unless otherwise noted.  DIAGNOSTIC FINDINGS: None update/relevant to this POC  COGNITION: Overall cognitive status: Within functional limits for tasks assessed   SENSATION: Decreased sensation in BUE and BLE secondary to incomplete quadriplegia Decreased sensation in proximal LLE as compared to distal LE  EDEMA:  Circumferential: R knee: 17; L knee: 17.5 and Figure 8: R ankle 21, L ankle 21.5  MUSCLE TONE: increased spasticity in BLE   POSTURE: rounded shoulders and forward head  LOWER EXTREMITY ROM:     Passive  Right Eval Left Eval  Hip flexion Tight hip flexors Tight hip flexors  Hip extension    Hip abduction    Hip adduction    Hip internal rotation    Hip external rotation    Knee flexion Tight HS Tight HS  Knee extension    Ankle dorsiflexion Decreased, tight gastroc Decreased, tight gastroc  Ankle plantarflexion    Ankle inversion    Ankle eversion     (Blank rows = not tested)  LOWER EXTREMITY MMT:    MMT Right Eval Left Eval  Hip flexion 1 2-  Hip extension    Hip abduction    Hip adduction    Hip internal rotation    Hip  external rotation    Knee flexion 0 3  Knee extension 2- 2-  Ankle dorsiflexion 2- 3  Ankle plantarflexion    Ankle inversion    Ankle eversion    (Blank rows = not tested)  BED MOBILITY:  From previous POC: Sit to supine Mod A Supine to sit Mod A Rolling to Right Mod A Rolling to Left Mod A Undulating mattress for wound management on standard bed (elevated-so often doing uphill sliding board transfers); she would like to continue working on sitting up independently, she has been working on rolling, needs less assistance w/ this when someone props her leg into hooklying; would like something to help her pull her left leg to her butt for stretching as well as bed mobility.  TRANSFERS: From previous POC: Pt continues using combination of bump over, slide board, and depression (squat pivot) transfers.                                                                                                                              TREATMENT:   TherAct Pt received seated in her power wheelchair with caregiver/nurse [name]. Session focus on bump transfers power wheelchair to/from mat table without slide board this session. Pt requires min A to bump from PWC to mat table, improved clearance of her buttocks noted during transfer.  To work on tricep strengthening and core strengthening: Seated lateral leans for cone retrieval, 2  x 5 reps B Pt with most difficulty coming back up from her L side due to increased weakness in this UE as compared to her RUE, needs mod manual assist for last 1-2 reps each set Seated anterior leans for cone retrieval from floor and chair in front of patient, 2 x 10 reps B Pt able to scoot herself forwards/backwards on mat table to readjust herself in order to reach cones more easily Pt able to return to sitting with each repetition, pushing up with BUE from mat table  Bump transfer back to PWC from mat table with min A, pt more fatigued this session with decreased  buttocks clearance noted. Pt left in care of [name] at end of session.   PATIENT EDUCATION: Education details: Continue stretching program and triceps strengthening exercises Person educated: Patient and Arts administrator Education method: Explanation, Demonstration, Tactile cues, and Verbal cues Education comprehension: verbalized understanding and returned demonstration  HOME EXERCISE PROGRAM: Access Code: [email or web address] URL: [email or web address] Date: 11/22/2023 Prepared by: Lorita Rosa  Exercises - Seated Elbow Extension with Self-Anchored Resistance  - 1 x daily - 7 x weekly - 3 sets - 10 reps - Supine Elbow Flexion Extension with Dumbbell  - 1 x daily - 7 x weekly - 3 sets - 10 reps - Seated Single Arm Elbow Extension Push-up on Table  - 1 x daily - 7 x weekly - 3 sets - 10 reps - Wheelchair Push-Up (AKA)  - 1 x daily - 7 x weekly - 3 sets - 5 reps  GOALS: Goals reviewed with patient? Yes  SHORT TERM GOALS: Target date: 10/07/2023  HEP to be established for stretching and strengthening as appropriate. Baseline: not established at initial eval, reviewed during PT POC - pt and [name] report it is going well at home (4/25) Goal status: MET  2.  Pt will perform sit to stand transfer with LRAD with mod A Baseline: max A to stedy (4/4), max A in // bars (4/25) Goal status: IN PROGRESS  3.  Pt to tolerate standing x 5 min with LRAD to demonstrate improved endurance Baseline: 30 sec in stedy (initial), 2:35 min in // bars (4/25) Goal status: IN PROGRESS  4.  Pt to demonstrate reduced edema in BLE with a reduction in circumference/figure 8 measurement by 0.5 from initial evaluation. Baseline: Circumferential: R knee: 17; L knee: 17.5 and Figure 8: R ankle 21, L ankle 21.5 4/25: R knee 16, L knee: 16 11/16, R ankle: 21, L ankle 20 Goal status: IN PROGRESS   LONG TERM GOALS: Target date: 11/04/2023  Patient and her caregiver to be independent with performance  of HEP for stretching and strengthening as appropriate. Baseline: not established at initial eval, reviewed during PT POC - pt and [name] report it is going well at home (4/25) Goal status: MET  2.  Pt will perform sit to stand transfer with LRAD with min A Baseline: max A to stedy (4/4), max A in // bars (4/25), max A in // bars (5/22) Goal status: NOT MET  3.  Pt to tolerate standing x 5 min with LRAD to demonstrate improved endurance Baseline: 30 sec in stedy (initial), 2:35 min in // bars (4/25), 3 min in // bars (5/22) Goal status: NOT MET  4.  Pt to demonstrate reduced edema in BLE with a reduction in circumference/figure 8 measurement by 1 from initial evaluation. Baseline: Circumferential:  R knee: 17; L knee: 17.5 and Figure 8: R ankle 21, L ankle 21.5  4/25: R knee 16, L knee: 16 11/16, R ankle: 21, L ankle 20 5/30: R knee: 16, L knee: 16, R ankle: 20.5, L ankle: 20 Goal status: PARTIALLY MET   NEW SHORT TERM GOALS:   Target date: 12/04/2023  Pt will perform sit to stand transfer with LRAD with mod A Baseline: max A to stedy (4/4), max A in // bars (4/25), max A in // bars (5/22) Goal status: REVISED  2.  Pt to tolerate standing x 4 min with LRAD to demonstrate improved endurance Baseline: 30 sec in stedy (initial), 2:35 min in // bars (4/25), 3 min in // bars (5/22) Goal status: REVISED  3.  Pt will progress to performing bump transfers with mod A at the most for increased independence with functional mobility Baseline: up to max A (5/30) Goal status: INITIAL   NEW LONG TERM GOALS:  Target date: 12/26/2023  Pt will perform sit to stand transfer with LRAD with min A Baseline: max A to stedy (4/4), max A in // bars (4/25), max A in // bars (5/22) Goal status: REVISED  2.  Pt to tolerate standing x 5 min with LRAD to demonstrate improved endurance Baseline: 30 sec in stedy (initial), 2:35 min in // bars (4/25), 3 min in // bars (5/22) Goal status:  REVISED  3.  Pt will progress to performing bump transfers with min A at the most for increased independence with functional mobility Baseline: up to max A (5/30) Goal status: INITIAL  4.  Pt will be able to maintain dynamic sitting balance in long-sit position x 5 min while performing a functional task with no more than mod A Baseline: max A (5/30) Goal status: INITIAL    ASSESSMENT:  CLINICAL IMPRESSION: Emphasis of skilled PT session on continuing to work on bump transfers without slide board this session along with continued tricep strengthening and core strengthening. She exhibits improved ability to clear her buttocks during bump transfers this date as compared to previous sessions, does have more difficulty with onset of fatigue. She continues to benefit from strengthening in order to increase her ability to clear her buttocks during transfers in order to increase her independence with transfers and decrease risk of shearing injuries from long term slide board use. Continue POC.    OBJECTIVE IMPAIRMENTS: decreased balance, decreased endurance, decreased mobility, difficulty walking, decreased ROM, decreased strength, increased edema, impaired perceived functional ability, increased muscle spasms, impaired flexibility, impaired sensation, impaired tone, impaired UE functional use, postural dysfunction, and pain.   ACTIVITY LIMITATIONS: carrying, lifting, bending, standing, stairs, transfers, bed mobility, continence, bathing, toileting, dressing, reach over head, and hygiene/grooming  PARTICIPATION LIMITATIONS: meal prep, cleaning, laundry, driving, shopping, community activity, and occupation  PERSONAL FACTORS: Age, Sex, Time since onset of injury/illness/exacerbation, and 1-2 comorbidities:   C7 ASIA C- incomplete quad w/ neurogenic bladder and bowel, HLD, Hx of skin cancerare also affecting patient's functional outcome.   REHAB POTENTIAL: Good  CLINICAL DECISION MAKING:  Stable/uncomplicated  EVALUATION COMPLEXITY: High  PLAN:  PT FREQUENCY: 2x/week  PT DURATION: 8 weeks  PLANNED INTERVENTIONS: 97164- PT Re-evaluation, 97110-Therapeutic exercises, 97530- Therapeutic activity, 97112- Neuromuscular re-education, 97535- Self Care, 16109- Manual therapy, [phone]- Gait training, [phone]- Orthotic Fit/training, [phone]- Electrical stimulation (manual), Patient/Family education, Balance training, Stair training, Taping, Dry Needling, Joint mobilization, Scar mobilization, Compression bandaging, DME instructions, Wheelchair mobility training, Cryotherapy, and Moist heat  PLAN FOR NEXT SESSION: any questions over tricep HEP? sit to stands, standing tolerance with stedy, in // bars,  possibly with RW, core strengthening/stability, endurance; wants to have conversation about taking a break (3-6 months) after this POC near end of this POC, standing lateral weight shifts, mini-squats in standing?, Yoga block press-ups vs push-up blocks, long-sitting - unsupported for core work?, bump transfers with and without slide board - pt wants to work on this skill!, lateral leans   Lorita Rosa, PT Lorita Rosa, PT, DPT, CSRS     11/23/2023, 12:32 PM

## 2023-11-24 ENCOUNTER — Encounter (HOSPITAL_BASED_OUTPATIENT_CLINIC_OR_DEPARTMENT_OTHER): Attending: Internal Medicine | Admitting: Internal Medicine

## 2023-11-24 DIAGNOSIS — S30810A Abrasion of lower back and pelvis, initial encounter: Secondary | ICD-10-CM | POA: Insufficient documentation

## 2023-11-24 DIAGNOSIS — T798XXA Other early complications of trauma, initial encounter: Secondary | ICD-10-CM | POA: Diagnosis not present

## 2023-11-24 DIAGNOSIS — X58XXXA Exposure to other specified factors, initial encounter: Secondary | ICD-10-CM | POA: Diagnosis not present

## 2023-11-24 DIAGNOSIS — S31829A Unspecified open wound of left buttock, initial encounter: Secondary | ICD-10-CM | POA: Insufficient documentation

## 2023-11-25 ENCOUNTER — Encounter: Attending: Physical Medicine and Rehabilitation | Admitting: Physical Medicine and Rehabilitation

## 2023-11-25 ENCOUNTER — Encounter: Payer: Self-pay | Admitting: Physical Medicine and Rehabilitation

## 2023-11-25 VITALS — BP 95/60 | HR 63 | Ht 64.0 in | Wt 126.0 lb

## 2023-11-25 DIAGNOSIS — M792 Neuralgia and neuritis, unspecified: Secondary | ICD-10-CM | POA: Diagnosis not present

## 2023-11-25 DIAGNOSIS — S31829D Unspecified open wound of left buttock, subsequent encounter: Secondary | ICD-10-CM | POA: Insufficient documentation

## 2023-11-25 DIAGNOSIS — Z993 Dependence on wheelchair: Secondary | ICD-10-CM | POA: Diagnosis not present

## 2023-11-25 DIAGNOSIS — Z9359 Other cystostomy status: Secondary | ICD-10-CM | POA: Insufficient documentation

## 2023-11-25 DIAGNOSIS — G8254 Quadriplegia, C5-C7 incomplete: Secondary | ICD-10-CM | POA: Insufficient documentation

## 2023-11-25 DIAGNOSIS — N319 Neuromuscular dysfunction of bladder, unspecified: Secondary | ICD-10-CM | POA: Insufficient documentation

## 2023-11-25 MED ORDER — TIZANIDINE HCL 4 MG PO TABS: 2.0000 mg | ORAL_TABLET | Freq: Two times a day (BID) | ORAL | 5 refills | Status: DC | PRN

## 2023-11-25 MED ORDER — NORTRIPTYLINE HCL 25 MG PO CAPS: 25.0000 mg | ORAL_CAPSULE | Freq: Every day | ORAL | 5 refills | Status: DC

## 2023-11-25 NOTE — Progress Notes (Signed)
 Subjective:    Patient ID: [name], female    DOB: 04/10/1951, 73 y.o.   MRN: [id number]  HPI   Pt is a 73 yr old L handed female with hx of incomplete quadriplegia- 2/14 2022- fleeing the police in [place] on passenger 100 (high speed) miles/hour,  [name]; neurogenic bowel and bladder and spasticity; [name], [name] Here for f/u on Incomplete quadriplegia   Doing OK- Ulcer is gone in L gluteal fold; couple little spots on L ischial- barely stage II Saw wound Care- they thought doing better.  Dressing is tearing up skin.  Muciprocin is being used.    5th digit- sticks out straight all the time.  Attempted Botox - just saw 5/28- is scheduled for Botox   July 16th-  Very mixed results-   3 days ago- could lift fingers on on L hand 2nd/3rd/4th Couldn't ever flex R fingers.  Also did affect grip and pinch-negatively.  will try R   Trip to [facility] in July-   Having fogginess/drowsiness and feeling OFF later in day.  Over the last 2 weeks.   Will fall asleep in middle of bedtime routine.   Sent a urine sample Monday- was negative . Having pain in bladder- can feel catheter- been irritating at little- but getting better- still having blood in urine- so will check with Dr Felipe Horton about cystoscopy.    No lymph nodes swollen No meds changed.  Moved Nortriptyline  to 3pm- 2 months ago-    Working with [employer]- [employer]- to get pelvis pushed forward-  Is specialized ROHO that she needs- built up in back- Dr Adriane Albe wrote Rx for this.   Pain Inventory Average Pain 3 Pain Right Now 4 My pain is burning  LOCATION OF PAIN  elbow, wrist, hand  BOWEL Number of stools per week: 2-3 Oral laxative use No  Type of laxative . Enema or suppository use magic bullet History of colostomy No  Incontinent No   BLADDER Suprapubic In and out cath, frequency . Able to self cath . Bladder incontinence No  Frequent urination No  Leakage with coughing No   Difficulty starting stream No  Incomplete bladder emptying No    Mobility use a wheelchair needs help with transfers  Function employed # of hrs/week 40 I need assistance with the following:  feeding, dressing, bathing, toileting, meal prep, household duties, and shopping  Neuro/Psych bladder control problems bowel control problems weakness tremor trouble walking spasms  Prior Studies Any changes since last visit?  no  Physicians involved in your care Any changes since last visit?  no   Family History  Problem Relation Age of Onset   Hypertension Father    Osteoporosis Other    Breast cancer Neg Hx    Social History   Socioeconomic History   Marital status: Married    Spouse name: Not on file   Number of children: Not on file   Years of education: Not on file   Highest education level: Doctorate  Occupational History   Not on file  Tobacco Use   Smoking status: Never   Smokeless tobacco: Never  Vaping Use   Vaping status: Never Used  Substance and Sexual Activity   Alcohol use: Not Currently    Alcohol/week: 7.0 standard drinks of alcohol    Types: 7 Glasses of wine per week   Drug use: Yes    Comment: wine at night   Sexual activity: Not on file  Other Topics Concern   Not on file  Social History Narrative   Married   Spouse had CABG    [employer] professor PhD   Alvenia Aus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   Had moved to [place]   hh of 2    Quadripareisis from [employer] injury     No current pets       Social Drivers of Corporate investment banker Strain: Low Risk  (09/11/2023)   Received from [facility]   Overall Financial Resource Strain (CARDIA)    Difficulty of Paying Living Expenses: Not hard at all  Food Insecurity: No Food Insecurity (09/11/2023)   Received from [facility]   Hunger Vital Sign    Within the past 12 months, you worried that your food would run out before you got the money to buy more.: Never true    Within the past 12 months, the food you bought  just didn't last and you didn't have money to get more.: Never true  Transportation Needs: No Transportation Needs (09/11/2023)   Received from [facility] - Transportation    Lack of Transportation (Medical): No    Lack of Transportation (Non-Medical): No  Physical Activity: Insufficiently Active (09/11/2023)   Received from [facility]   Exercise Vital Sign    On average, how many days per week do you engage in moderate to strenuous exercise (like a brisk walk)?: 3 days    On average, how many minutes do you engage in exercise at this level?: 20 min  Stress: No Stress Concern Present (09/11/2023)   Received from [facility] of Occupational Health - Occupational Stress Questionnaire    Feeling of Stress : Not at all  Social Connections: Socially Integrated (09/11/2023)   Received from [facility]   Social Network    How would you rate your social network (family, work, friends)?: Good participation with social networks   Past Surgical History:  Procedure Laterality Date   BUNIONECTOMY     Past Medical History:  Diagnosis Date   CERVICAL POLYP 03/11/2008   Qualifier: Diagnosis of  By: Ethel Henry MD, Joaquim Muir    Colon polyps 2005   on colonscopy Dr. Sandrea Cruel   Fibroid 2004   Per Dr. Alva Jewels   History of shingles    face and mouth   Hx of skin cancer, basal cell    Rosacea    Sciatica of left side 09/28/2013   Scoliosis    noted on mri done for back pain   BP 95/60   Pulse 63   Ht 5' 4 (1.626 m)   Wt 126 lb (57.2 kg)   SpO2 94%   BMI 21.63 kg/m   Opioid Risk Score:   Fall Risk Score:  `1  Depression screen [facility] 2/9     11/09/2023    1:15 PM 09/28/2023   11:51 AM 09/19/2023    9:55 AM 09/02/2023   10:21 AM 04/25/2023   10:14 AM 03/15/2023    3:19 PM 10/13/2022    9:40 AM  Depression screen PHQ 2/9  Decreased Interest 0 0 0 0 0 0 0  Down, Depressed, Hopeless 0 0 0 0 0 0 0  PHQ - 2 Score 0 0 0 0 0 0 0     Review of Systems   Musculoskeletal:        Elbow, wrist, hand pain  All other systems reviewed and are negative.     Objective:  Physical Exam Awake, alert, appropriate, in power w/c; accompanied by [name]- aide; NAD Some mild hyperextension from DIP- on L 3rd digit Can fully flex L 5th digit, but pt cannot active flex it A lot of loss of intrinsic atrophy No hoffman's B/L today MAS of 2 in hip abduction- B/L however at knee MAS of 1 Sustained clonus B/L 1+ LE edema B/L- has on thigh high Compression sockcs-       Assessment & Plan:   Pt is a 73 yr old L handed female with hx of incomplete quadriplegia- 2/14 2022- fleeing the police in [place] on passenger 100 (high speed) miles/hour,  [name]; neurogenic bowel and bladder and spasticity; [name], [name] Here for f/u on Incomplete quadriplegia   For multiple pressure ulcers Padded briefs- to pad the buttocks! Try it?   2. Tegarderm- might be appropriate for wound care on buttocks.    3.  We discussed options- and will reduce Nortriptyline  to 25 mg every evening- for now- can move later form 3pm if need be.    4. Con't Baclofen  140-160 mg/day- from 07/29/23   5. Con't gabapentin - has refilled from 5/27   6. F/U with Dr Felipe Horton about hematuria/blood in urine  7. Needs a type of ROHO cushion- due to multiple pressure ulcers- has been written by Dr Adriane Albe-    8. Dr Felipe Horton has taken over Toviaz  and Myrbetriq.    9.  Con't Zanaflex /Tizanidine  2 mg nightly for spasticity,    10. F/U  3 months double appt- SCI  11. Also spoke with pt about L 5th digit- and that it probably won't be under active ROM.   12. Wrote written Rx for gabapentin  1200 mg TID since pt going to [place] and it's a controlled substance   I spent a total of  43   minutes on total care today- >50% coordination of care- due to

## 2023-11-25 NOTE — Patient Instructions (Addendum)
 Pt is a 73 yr old L handed female with hx of incomplete quadriplegia- 2/14 2022- fleeing the police in [place] on passenger 100 (high speed) miles/hour,  [name]; neurogenic bowel and bladder and spasticity; [name], [name] Here for f/u on Incomplete quadriplegia   For multiple pressure ulcers Padded briefs- to pad the buttocks! Try it?   2. Tegarderm- might be appropriate for wound care on buttocks.    3.  We discussed options- and will reduce Nortriptyline  to 25 mg every evening- for now- can move later form 3pm if need be.    4. Con't Baclofen  140-160 mg/day- from 07/29/23   5. Con't gabapentin - has refilled from 5/27   6. F/U with Dr Felipe Horton about hematuria/blood in urine  7. Needs a type of ROHO cushion- due to multiple pressure ulcers- has been written by Dr Adriane Albe-    8. Dr Felipe Horton has taken over Toviaz  and Myrbetriq.    9.  Con't Zanaflex /Tizanidine  2 mg nightly for spasticity,    10. F/U  3 months double appt- SCI   11. Also spoke with pt about L 5th digit- and that it probably won't be under active ROM.

## 2023-11-28 ENCOUNTER — Ambulatory Visit: Admitting: Physical Therapy

## 2023-11-28 ENCOUNTER — Ambulatory Visit: Admitting: Occupational Therapy

## 2023-11-28 ENCOUNTER — Encounter: Payer: Self-pay | Admitting: Physical Therapy

## 2023-11-28 DIAGNOSIS — G8254 Quadriplegia, C5-C7 incomplete: Secondary | ICD-10-CM

## 2023-11-28 DIAGNOSIS — R208 Other disturbances of skin sensation: Secondary | ICD-10-CM | POA: Diagnosis not present

## 2023-11-28 DIAGNOSIS — M25642 Stiffness of left hand, not elsewhere classified: Secondary | ICD-10-CM | POA: Diagnosis not present

## 2023-11-28 DIAGNOSIS — R278 Other lack of coordination: Secondary | ICD-10-CM

## 2023-11-28 DIAGNOSIS — R293 Abnormal posture: Secondary | ICD-10-CM

## 2023-11-28 DIAGNOSIS — R2689 Other abnormalities of gait and mobility: Secondary | ICD-10-CM | POA: Diagnosis not present

## 2023-11-28 DIAGNOSIS — M25641 Stiffness of right hand, not elsewhere classified: Secondary | ICD-10-CM | POA: Diagnosis not present

## 2023-11-28 DIAGNOSIS — R29898 Other symptoms and signs involving the musculoskeletal system: Secondary | ICD-10-CM

## 2023-11-28 DIAGNOSIS — M6281 Muscle weakness (generalized): Secondary | ICD-10-CM

## 2023-11-28 DIAGNOSIS — R29818 Other symptoms and signs involving the nervous system: Secondary | ICD-10-CM

## 2023-11-28 NOTE — Therapy (Signed)
 OUTPATIENT OCCUPATIONAL THERAPY NEURO TREATMENT   Patient Name: [name] MRN: [id number] DOB:1950-11-21, 73 y.o., female Today's Date: 11/28/2023  PCP: Reginal Capra, MD  REFERRING PROVIDER: Celia Coles, MD  END OF SESSION:  OT End of Session - 11/28/23 1019     Visit Number 17    Number of Visits 32    Date for OT Re-Evaluation 01/27/24    Authorization Type [employer] - re-auth submitted    Authorization Time Period 09/09/2023 - 11/04/2023    OT Start Time 1017    OT Stop Time 1100    OT Time Calculation (min) 43 min    Activity Tolerance Patient tolerated treatment well    Behavior During Therapy [facility] for tasks assessed/performed         Past Medical History:  Diagnosis Date   CERVICAL POLYP 03/11/2008   Qualifier: Diagnosis of  By: Ethel Henry MD, Joaquim Muir    Colon polyps 2005   on colonscopy Dr. Sandrea Cruel   Fibroid 2004   Per Dr. Alva Jewels   History of shingles    face and mouth   Hx of skin cancer, basal cell    Rosacea    Sciatica of left side 09/28/2013   Scoliosis    noted on mri done for back pain   Past Surgical History:  Procedure Laterality Date   BUNIONECTOMY     Patient Active Problem List   Diagnosis Date Noted   Buttock wound, left, subsequent encounter 03/16/2023   Bronchiectasis with acute exacerbation (HCC) 03/15/2023   Buttock wound, left, initial encounter 11/02/2022   Orthostatic hypotension 08/13/2022   Neurogenic bowel 05/03/2022   Spasticity 05/03/2022   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/03/2022   Nerve pain 05/03/2022   Medication monitoring encounter 01/08/2022   Neurogenic bladder 10/11/2021   Urinary incontinence 10/11/2021   ESBL (extended spectrum beta-lactamase) producing bacteria infection 10/09/2021   Recurrent UTI 10/09/2021   Quadriplegia, C5-C7 incomplete (HCC) 01/16/2021   History of spinal fracture 01/16/2021   Suprapubic catheter (HCC) 01/16/2021   Encounter for routine gynecological examination 09/28/2013    Onychomycosis 09/28/2013   Foot deformity, acquired 03/26/2012   Encounter for preventive health examination 12/25/2010   ROSACEA 08/25/2009   Disturbance in sleep behavior 03/11/2008   SKIN CANCER, HX OF 03/11/2008   DYSURIA, HX OF 03/11/2008   Hyperlipidemia 02/10/2007   CERVICALGIA 02/10/2007    ONSET DATE: 07/28/2020  Date of Referral 08/29/2023   REFERRING DIAG: G82.54 (ICD-10-CM) - Quadriplegia, C5-C8, incomplete  THERAPY DIAG:  Other lack of coordination  Muscle weakness (generalized)  Stiffness of left hand, not elsewhere classified  Rationale for Evaluation and Treatment: Rehabilitation  SUBJECTIVE:   SUBJECTIVE STATEMENT: Pt reports she can tell the effects of the Botox  are wearing off and her fingers are feeling it.  The next Botox  injections are July 16th. Per MD note: they will increase the dosage for the right hand and try to target the 4-5th digits in the left hand more, but keep the same dosage.  Pt accompanied by: Caregiver, [name]   PERTINENT HISTORY: Pt is a 73 yr old L handed female with hx of incomplete quadriplegia- 2/14 2022- fleeing the police in [place] on passenger 100 (high speed) miles/hour,  [name]; neurogenic bowel and bladder and spasticity; [name], [name] Here for f/u on Incomplete quadriplegia  B femur fractures December, 2024.  PRECAUTIONS: Fall; suprapubic catheter (she wants to get this removed meaning she needs to get to and  from the toilet); she has had minor heat sensation when needing to complete her bowel program-possible AD?   WEIGHT BEARING RESTRICTIONS: No  PAIN: - reports average pain as noted below. Will notify therapist if there are changes in her pain.  Are you having pain? Yes: NPRS scale: 3/10 Pain location: fingers to elbow bilaterally Pain description: constant Aggravating factors: it can increased over time ie) is worse at the end of the day.  Also cold affects cramps and function. Relieving  factors: gabapentin  and baclofen  for spasms, nightly stretching  FALLS: Has patient fallen in last 6 months? Yes. Number of falls 1  LIVING ENVIRONMENT: Lives with: lives with their family - husband [name] and with an adult companion s/p moving back up from [place]  x10 months Lives in: House/apartment Stairs: 4 story town house with an Engineer, structural with threshold adjustments, roll in shower with transport chair Has following equipment at home: Wheelchair (power) - with seat height adjustments to access counters and reclining option, Wheelchair (manual), transport WC, shower chair, and Ramped entry, handheld showerhead with rails around toilet, had Alen Amy but is no longer in need of it, has slide boards x3  PLOF: Requires assistive device for independence, Needs assistance with ADLs, Needs assistance with homemaking, Needs assistance with gait, and Needs assistance with transfers; 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  Used to like to knit, sew and bake.  PATIENT GOALS: improve spasticity and use of hands  OBJECTIVE:   HAND DOMINANCE: Left  ADLs: Overall ADLs: Patient has a live in caregiver  Transfers/ambulation related to ADLs: min assist with sliding board transfers.  Eating: Has a rocker knife that she can use. Used to use adapted utensils but now uses regular utensils but still will get assistance to cut food ie) when eating out.  Grooming: can brush her own hair with LUE only; unable to manage jewelry ie) earrings  UB Dressing: can zip/unzip after it has been started, unable to manage buttons herself, Caregiver assists but if she has extra time, she can put on her bra, and a loose fitting pullover shirt/t-shirt  LB Dressing: dependent for LB dressing in bed and with special sock donner for LE compression garments   Toileting: bladder trained with suprapubic catheter which she clamps off.  Dependent for bowel incontinence care.  Bathing: Sponge bath with adult  washclothes.  Can bathe UB with back scrubber for most of her back.  Needs help with feet (mentioned she might need a separate brush for feet)   Tub Shower transfers: Min assist with slide board to wheel in shower chair  Equipment: Shower seat with back, Walk in shower, bed side commode, Reacher, Sock aid, Long handled sponge, and Feeding equipment  IADLs: --  Shopping: Assisted by caregiver  Light housekeeping: Has housekeeper that comes monthly  Meal Prep: previously enjoyed baking. Assisted by caregiver but has reheated a meal for herself after getting food out of the fridge/freezer from her WC.  Community mobility: Dependent  Medication management: Caregiver sorts them into pillbox but she is very aware of her medications   Financial management: Patient manages her own finances  Handwriting: Increased time and has a pen with a little grip  MOBILITY STATUS: Independent with power mobility  ACTIVITY TOLERANCE: Activity tolerance: good to Fair - MMT WFL but has limited sustained tolerance for ongoing use of Ues with poor trunk control  FUNCTIONAL OUTCOME MEASURES:  PSFS: 3.3 total score  10/12/2023: 3.7 total score   Total  score = sum of the activity scores/number of activities Minimum detectable change (90%CI) for average score = 2 points Minimum detectable change (90%CI) for single activity score = 3 points   UPPER EXTREMITY ROM:   AROM - WFL without obvious contractures, some digital flexion noted but PROM WNL   UPPER EXTREMITY MMT:   Grossly WFL - Endurance limited R tricep strength > than L but L UE generally stronger than R UE  MMT Right (eval) Left (eval)  Shoulder flexion 4/5 4/5  Shoulder abduction 4/5 4/5  Elbow flexion 4/5 4/5  Elbow extension 4/5 4/5  (Blank rows = not tested)  HAND FUNCTION: Grip strength: Right: 4.4 lbs (decline) ; Left: 18 lbs (slight improvement)  COORDINATION: 09/29/23 s/p Botox  injections yesterday  Left: 56.13 sec Right  3:39.58 min  SENSATION: Light touch: Impaired  - patient   EDEMA: NA for UEs but LE has poor lymph drainage with custom compression garments   MUSCLE TONE: Generally WFL   COGNITION: Overall cognitive status: Within functional limits for tasks assessed  VISION: Subjective report: Patent wears progressive lens/glasses.  Denies diplopia or vision changes. Baseline vision: Wears glasses all the time  VISION ASSESSMENT: WFL  OBSERVATIONS: Patient independent with power WC navigation within clinic.  Patient is well-kept with foley catheter in place.  She has slight limitations in full extension of digits but PROM is WNL.    TODAY'S TREATMENT:      Splinting options Due to digital tightness and plans for upcoming Botox  injections in her hands again, reviewed splinting options for managing spasticity ie) anti-spasticity positioning with ball, finger abduction and trialled some options that may even fit in her current splint ie) soft strapping material velcroed in a wave to separate her individual fingers with good comfort noted and pt to bring her splints from home to see if this would be helpful and effective even now.                                                                                        Therapeutic Activities As pt and therapist were discussing options when she gets her next Botox , session began to focus on improved motor coordination of her hands as fingers tend to overlap and digital opposition ins not as effective as before.  Ended up translating finger opposition into Putty Exercises as she has yellow putty that can be used to further improve UE strength and coordination of B UEs.  Patient provided visual demonstration, verbal and tactile cues as needed to improve performance of activities including: - Pinching and Pulling Putty with 3-Point Pinch or Tip Pinch where patient is encouraged to keep her L digits from overlapping each other with pinch and pull motion of putty  pulling away from midline.  She is able to keep her ring and middle finger flat against the putty > 10 reps with extra time and cues to focus on MCP flexion with fingers extended as they tend to overlap with finger flexion.  Pt pulls the putty more effectively with her L hand than R hand.  Patient benefited from extra time, verbal/tactile cues, and modeling of task to allow  time for processing of verbal instructions and improve motor planning of isolated finger movements.  PATIENT EDUCATION: Education details: Putty exercise for fingers Person educated: Patient and Caregiver - Programmer, systems Education method: Explanation, Demonstration, and Verbal cues Education comprehension: verbalized understanding, returned demonstration, verbal cues required, and needs further education  HOME EXERCISE PROGRAM: Previously issued HEP per DC 12/02/22: All previous HEPs combined to 1 complete List through [employer] Access Code: [email or web address] 10/10/2023: Alternative [employer] application guidance  GOALS:   SHORT TERM GOALS: Target date: 12/22/2023   1. Patient will verbalize understanding of AE/modified techniques to improve independence and safety with ADL and IADL completion. Baseline: Caregiver/spouse assist Goal status: IN PROGRESS  2.  Pt will be independent with BUE braces/splints as needed to prevent contracture and improve functional use of hands.  Baseline: Caregiver/spouse assist Goal status: MET  LONG TERM GOALS: Target date: 01/27/24   Patient will demonstrate independence with updated HEP for UE strengthening, coordination and ROM to prevent contractures and maintain strength for transfers and ADLs. Baseline: Previous HEPs have been established but need to be reviewed and updated.  Goal status: IN Progress  2.  Patient will report at least two-point increase in average PSFS score or at least three-point increase in a single activity score indicating functionally significant improvement given minimum  detectable change. Baseline: 3.3 total score (See above for individual activity scores)  Goal status: IN Progress  3.  Patient will demonstrate at least 10 lbs R grip strength as needed to open jars and other containers. Baseline: 4.4 lbs 09/28/23: Botox  injections  10/12/2023: R - 1.7 lbs; L - 4.1, 7.4 lbs Goal status: IN PROGRESS  ASSESSMENT:  CLINICAL IMPRESSION: Patient responding well to ideas for UE coordination, splinting and addressing UE deficits due to SCI. Pt will benefit from continued skilled OT services in the outpatient setting to work on adaptive needs and manage B hand spasticity following Botox  injections.   PERFORMANCE DEFICITS: in functional skills including ADLs, IADLs, coordination, dexterity, strength, muscle spasms, Fine motor control, Gross motor control, continence, skin integrity, and UE functional use,   IMPAIRMENTS: are limiting patient from ADLs, IADLs, work, and leisure.   CO-MORBIDITIES: has co-morbidities such as incontinence and wound that affects occupational performance. Patient will benefit from skilled OT to address above impairments and improve overall function.  REHAB POTENTIAL: Fair due to chronicity of injury  PLAN:  OT FREQUENCY: 1-2x/week   OT DURATION: Additional 8 weeks  PLANNED INTERVENTIONS: self care/ADL training, therapeutic exercise, therapeutic activity, neuromuscular re-education, manual therapy, passive range of motion, balance training, functional mobility training, splinting, patient/family education, energy conservation, coping strategies training, and DME and/or AE instructions  RECOMMENDED OTHER SERVICES: Patient was seen for PT evaluation today with treatment plans coordinated for 2x/week.  CONSULTED AND AGREED WITH PLAN OF CARE: Patient and family member/caregiver  PLAN FOR NEXT SESSION:   NMES review second unit reciprocal option PRN  Review computer adaptions PRN - cat tongue to mouse keys vs foam?  Check splints  PRN Review/progress HEPs Explore FM tasks and establish weight shifting instruction for pressure relief.  Cutting, drink opening, hair  other ADL AE (jar opener)  Zora Hires, OT 11/28/2023, 12:13 PM

## 2023-11-28 NOTE — Therapy (Signed)
 OUTPATIENT PHYSICAL THERAPY NEURO TREATMENT   Patient Name: [name] MRN: [id number] DOB:Apr 03, 1951, 73 y.o., female Today's Date: 11/28/2023   PCP: Reginal Capra, MD REFERRING PROVIDER: Celia Coles, MD   END OF SESSION:    PT End of Session - 11/28/23 1334     Visit Number 22    Number of Visits 29   recert   Date for PT Re-Evaluation 01/01/24   to allow for scheduling delays   Authorization Type [employer] MEDICARE    PT Start Time 1105    PT Stop Time 1158    PT Time Calculation (min) 53 min    Equipment Utilized During Treatment Gait belt    Activity Tolerance Patient tolerated treatment well    Behavior During Therapy WFL for tasks assessed/performed                       Past Medical History:  Diagnosis Date   CERVICAL POLYP 03/11/2008   Qualifier: Diagnosis of  By: Ethel Henry MD, Joaquim Muir    Colon polyps 2005   on colonscopy Dr. Sandrea Cruel   Fibroid 2004   Per Dr. Alva Jewels   History of shingles    face and mouth   Hx of skin cancer, basal cell    Rosacea    Sciatica of left side 09/28/2013   Scoliosis    noted on mri done for back pain   Past Surgical History:  Procedure Laterality Date   BUNIONECTOMY     Patient Active Problem List   Diagnosis Date Noted   Buttock wound, left, subsequent encounter 03/16/2023   Bronchiectasis with acute exacerbation (HCC) 03/15/2023   Buttock wound, left, initial encounter 11/02/2022   Orthostatic hypotension 08/13/2022   Neurogenic bowel 05/03/2022   Spasticity 05/03/2022   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/03/2022   Nerve pain 05/03/2022   Medication monitoring encounter 01/08/2022   Neurogenic bladder 10/11/2021   Urinary incontinence 10/11/2021   ESBL (extended spectrum beta-lactamase) producing bacteria infection 10/09/2021   Recurrent UTI 10/09/2021   Quadriplegia, C5-C7 incomplete (HCC) 01/16/2021   History of spinal fracture 01/16/2021   Suprapubic catheter (HCC) 01/16/2021   Encounter for routine  gynecological examination 09/28/2013   Onychomycosis 09/28/2013   Foot deformity, acquired 03/26/2012   Encounter for preventive health examination 12/25/2010   ROSACEA 08/25/2009   Disturbance in sleep behavior 03/11/2008   SKIN CANCER, HX OF 03/11/2008   DYSURIA, HX OF 03/11/2008   Hyperlipidemia 02/10/2007   CERVICALGIA 02/10/2007    ONSET DATE: 08/29/2023 (referral date)  REFERRING DIAG: G82.54 (ICD-10-CM) - Incomplete quadriplegia at C5-C8 level (HCC)  THERAPY DIAG:  Muscle weakness (generalized)  Other symptoms and signs involving the nervous system  Other symptoms and signs involving the musculoskeletal system  Abnormal posture  Quadriplegia, C5-C7 incomplete (HCC)  Other lack of coordination  Rationale for Evaluation and Treatment: Rehabilitation  SUBJECTIVE:  SUBJECTIVE STATEMENT: Pt reports no acute changes since last visit. Pt has some soreness in her arms after yesterday, reviewed all exercises with [name] at home, no questions.   From initial eval: Pt familiar to this clinic, last seen Oct-Nov 2024 but has been seen for multiple POCs since her initial injury in 2022. Pt returns to this clinic after being in [place]  for the past few months. While in [place]  in December 2024 patient had a fall where she slid forwards out of her wheelchair onto the floor, ended up fracturing both of her femurs. Pt was hospitalized for 10-11 days and had surgical repair of her femurs. Pt reports she has been cleared of all restrictions since surgery, does have ongoing swelling in both legs and worsened spasticity. Pt reports that the spasticity has slightly improved since it initially started, was told by Dr. Lovorn the swelling may not resolve for 6-8 months (it has been 3 months) and not sure if  spasticity will resolve but patient is hopeful that if her swelling improves her spasticity will improve as well.  Pt asking about other options to help manage the swelling her legs, has tried variable compression stockings (knee high) and her PTs in [place]  recommended tight shapewear. Pt reports that the knee-high compression stockings led to increased swelling in her knees and the shapewear did not help her swelling. Encouraged patient to get thigh-high compression stockings and elevate her LE in PWC. Pt has ordered an articulating bed that will get here next Monday (3/31) so she can better elevate her legs when in bed.  Pt is also concerned about decreased ROM in her legs, [name] works on stretching her legs frequently but she feels she has more motion in her LLE as compared to RLE and may have mild foot drop on her R side. Pt does have PRAFOs to wear at night. Pt has worked up to standing in her standing frame x 30-40 min at a time. Pt also worked on standing with her PT and in // bars in [place] . Pt also reports with her injuries she lost the ability to lock/unlock her R knee but that it is getting better. She reports she lost a lot of stamina during her hospital stay as well.  Pt also has a new wound since last seen in this clinic in her L gluteal fold, shearing injury. Pt was seeing wound care in [place]  and is scheduled to see wound care with [facility] early April (was not able to schedule with [facility] wound care until late April).  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 Yes: NPRS scale: 3-4 Pain location: elbows to fingertips on both arms Pain description: nerve pain Aggravating factors: not stated Relieving factors: not stated  PRECAUTIONS: Fall and Other: osteoporosis  RED FLAGS: None   WEIGHT BEARING RESTRICTIONS: No  FALLS: Has patient fallen in last 6  months? Yes. Number of falls 1 fall in [place]  that resulted in B femur fractures  LIVING ENVIRONMENT: Lives with: lives with their spouse and and with full-time caregiver [name] Lives in: House/apartment Home is power wheelchair accessible Has following equipment at home: Wheelchair (power), Wheelchair (manual), Grab bars, Ramped entry, and standing frame, slide board  PLOF: Independent with household mobility with device, Independent with community mobility with device, Requires assistive device for independence, Needs assistance with ADLs, and Needs assistance with  transfers  PATIENT GOALS: still working on stand and pivot with goal to pivot to commode or to a chair work on core-will help me with standing improve my stamina - being in the hospital I lost strength/endurance   OBJECTIVE:  Note: Objective measures were completed at Evaluation unless otherwise noted.  DIAGNOSTIC FINDINGS: None update/relevant to this POC  COGNITION: Overall cognitive status: Within functional limits for tasks assessed   SENSATION: Decreased sensation in BUE and BLE secondary to incomplete quadriplegia Decreased sensation in proximal LLE as compared to distal LE  EDEMA:  Circumferential: R knee: 17; L knee: 17.5 and Figure 8: R ankle 21, L ankle 21.5  MUSCLE TONE: increased spasticity in BLE   POSTURE: rounded shoulders and forward head  LOWER EXTREMITY ROM:     Passive  Right Eval Left Eval  Hip flexion Tight hip flexors Tight hip flexors  Hip extension    Hip abduction    Hip adduction    Hip internal rotation    Hip external rotation    Knee flexion Tight HS Tight HS  Knee extension    Ankle dorsiflexion Decreased, tight gastroc Decreased, tight gastroc  Ankle plantarflexion    Ankle inversion    Ankle eversion     (Blank rows = not tested)  LOWER EXTREMITY MMT:    MMT Right Eval Left Eval  Hip flexion 1 2-  Hip extension    Hip abduction    Hip adduction    Hip  internal rotation    Hip external rotation    Knee flexion 0 3  Knee extension 2- 2-  Ankle dorsiflexion 2- 3  Ankle plantarflexion    Ankle inversion    Ankle eversion    (Blank rows = not tested)  BED MOBILITY:  From previous POC: Sit to supine Mod A Supine to sit Mod A Rolling to Right Mod A Rolling to Left Mod A Undulating mattress for wound management on standard bed (elevated-so often doing uphill sliding board transfers); she would like to continue working on sitting up independently, she has been working on rolling, needs less assistance w/ this when someone props her leg into hooklying; would like something to help her pull her left leg to her butt for stretching as well as bed mobility.  TRANSFERS: From previous POC: Pt continues using combination of bump over, slide board, and depression (squat pivot) transfers.                                                                                                                              TREATMENT:   TherAct Pt received seated in her power wheelchair with caregiver/nurse [name] following OT session. Session focus on bump transfers power wheelchair to/from mat table with slide board this session. Pt requires minA for foot adjustment/placement to enhance lift with bump from PWC to mat table, intermittent adequate clearance noted.  From elevated mat table (knees between 50-60 degrees of flexion): Attempted  to stand x3 w/ draw sheet supporting hips/pelvis Pt having difficulty with vertical push from BLE despite knees blocked on first attempt, some buckling noted left and right and pt having difficulty maintaining BUE grip on therapist shoulders During second and third attempts caregiver provides some vertical assistance ranging from mod-maxA +2, unable to safely rise to stand on 3rd attempt Time spent adjusting hand placement for improved grip, scooting to adjust LE positioning, and trying various therapist foot positioning to  improve pt's ability to translate forward during initial push - she does prefer just RLE blocked allowing closer proximity for grip and pt head to traverse over therapist right shoulder slightly STS x1 from same elevated height w/o draw sheet, unable to lift from mat TotalA  NMR: Pt propped in long-sit on mat maxA (pt too fatigued to try C walking into prop) - hand walking to toes and up to edge of seated balance x5 > long-sitting tricep push-ups working into as much thoracic extension as able x10 > tall long-sitting for closed chain scapular retraction x15  Bump transfer back to PWC from mat table with minA for foot placement and knee guarding due to fatigue, pt has improved bottom clearance during middle portion of transfer.  Patient and [name] request to do wheelchair and clothing adjustment independent of therapist at end of session.   PATIENT EDUCATION: Education details: Continue stretching program and triceps strengthening exercises assigned last visit. Person educated: Patient and Arts administrator Education method: Explanation, Demonstration, Tactile cues, and Verbal cues Education comprehension: verbalized understanding and returned demonstration  HOME EXERCISE PROGRAM: Access Code: [email or web address] URL: [email or web address] Date: 11/22/2023 Prepared by: Lorita Rosa  Exercises - Seated Elbow Extension with Self-Anchored Resistance  - 1 x daily - 7 x weekly - 3 sets - 10 reps - Supine Elbow Flexion Extension with Dumbbell  - 1 x daily - 7 x weekly - 3 sets - 10 reps - Seated Single Arm Elbow Extension Push-up on Table  - 1 x daily - 7 x weekly - 3 sets - 10 reps - Wheelchair Push-Up (AKA)  - 1 x daily - 7 x weekly - 3 sets - 5 reps  GOALS: Goals reviewed with patient? Yes  SHORT TERM GOALS: Target date: 10/07/2023  HEP to be established for stretching and strengthening as appropriate. Baseline: not established at initial eval, reviewed during PT POC - pt and [name]  report it is going well at home (4/25) Goal status: MET  2.  Pt will perform sit to stand transfer with LRAD with mod A Baseline: max A to stedy (4/4), max A in // bars (4/25) Goal status: IN PROGRESS  3.  Pt to tolerate standing x 5 min with LRAD to demonstrate improved endurance Baseline: 30 sec in stedy (initial), 2:35 min in // bars (4/25) Goal status: IN PROGRESS  4.  Pt to demonstrate reduced edema in BLE with a reduction in circumference/figure 8 measurement by 0.5 from initial evaluation. Baseline: Circumferential: R knee: 17; L knee: 17.5 and Figure 8: R ankle 21, L ankle 21.5 4/25: R knee 16, L knee: 16 11/16, R ankle: 21, L ankle 20 Goal status: IN PROGRESS   LONG TERM GOALS: Target date: 11/04/2023  Patient and her caregiver to be independent with performance of HEP for stretching and strengthening as appropriate. Baseline: not established at initial eval, reviewed during PT POC - pt and [name] report it is going well at home (4/25) Goal status: MET  2.  Pt will perform sit  to stand transfer with LRAD with min A Baseline: max A to stedy (4/4), max A in // bars (4/25), max A in // bars (5/22) Goal status: NOT MET  3.  Pt to tolerate standing x 5 min with LRAD to demonstrate improved endurance Baseline: 30 sec in stedy (initial), 2:35 min in // bars (4/25), 3 min in // bars (5/22) Goal status: NOT MET  4.  Pt to demonstrate reduced edema in BLE with a reduction in circumference/figure 8 measurement by 1 from initial evaluation. Baseline: Circumferential:  R knee: 17; L knee: 17.5 and Figure 8: R ankle 21, L ankle 21.5 4/25: R knee 16, L knee: 16 11/16, R ankle: 21, L ankle 20 5/30: R knee: 16, L knee: 16, R ankle: 20.5, L ankle: 20 Goal status: PARTIALLY MET   NEW SHORT TERM GOALS:   Target date: 12/04/2023  Pt will perform sit to stand transfer with LRAD with mod A Baseline: max A to stedy (4/4), max A in // bars (4/25), max A in // bars  (5/22) Goal status: REVISED  2.  Pt to tolerate standing x 4 min with LRAD to demonstrate improved endurance Baseline: 30 sec in stedy (initial), 2:35 min in // bars (4/25), 3 min in // bars (5/22) Goal status: REVISED  3.  Pt will progress to performing bump transfers with mod A at the most for increased independence with functional mobility Baseline: up to max A (5/30) Goal status: INITIAL   NEW LONG TERM GOALS:  Target date: 12/26/2023  Pt will perform sit to stand transfer with LRAD with min A Baseline: max A to stedy (4/4), max A in // bars (4/25), max A in // bars (5/22) Goal status: REVISED  2.  Pt to tolerate standing x 5 min with LRAD to demonstrate improved endurance Baseline: 30 sec in stedy (initial), 2:35 min in // bars (4/25), 3 min in // bars (5/22) Goal status: REVISED  3.  Pt will progress to performing bump transfers with min A at the most for increased independence with functional mobility Baseline: up to max A (5/30) Goal status: INITIAL  4.  Pt will be able to maintain dynamic sitting balance in long-sit position x 5 min while performing a functional task with no more than mod A Baseline: max A (5/30) Goal status: INITIAL    ASSESSMENT:  CLINICAL IMPRESSION: Emphasis of skilled PT session on continuing to address patient's desire to work on bump transfers with the slide board with some increased lift noted this visit.  She has marked trouble standing from elevated mat surface this visit and PT and pt unable to determine root cause as alignment and other mechanics in patient's advantage.  She was having more difficulty maintaining grip on therapist shoulders w/ and w/o gloves donned.  Unsure if this can be attributed to length of time between attempts to stand without machinery since femur fractures.  Will continue per POC.    OBJECTIVE IMPAIRMENTS: decreased balance, decreased endurance, decreased mobility, difficulty walking, decreased ROM, decreased  strength, increased edema, impaired perceived functional ability, increased muscle spasms, impaired flexibility, impaired sensation, impaired tone, impaired UE functional use, postural dysfunction, and pain.   ACTIVITY LIMITATIONS: carrying, lifting, bending, standing, stairs, transfers, bed mobility, continence, bathing, toileting, dressing, reach over head, and hygiene/grooming  PARTICIPATION LIMITATIONS: meal prep, cleaning, laundry, driving, shopping, community activity, and occupation  PERSONAL FACTORS: Age, Sex, Time since onset of injury/illness/exacerbation, and 1-2 comorbidities:   C7 ASIA C- incomplete quad  w/ neurogenic bladder and bowel, HLD, Hx of skin cancerare also affecting patient's functional outcome.   REHAB POTENTIAL: Good  CLINICAL DECISION MAKING: Stable/uncomplicated  EVALUATION COMPLEXITY: High  PLAN:  PT FREQUENCY: 2x/week  PT DURATION: 8 weeks  PLANNED INTERVENTIONS: 97164- PT Re-evaluation, 97110-Therapeutic exercises, 97530- Therapeutic activity, 97112- Neuromuscular re-education, 97535- Self Care, 16109- Manual therapy, [phone]- Gait training, [phone]- Orthotic Fit/training, [phone]- Electrical stimulation (manual), Patient/Family education, Balance training, Stair training, Taping, Dry Needling, Joint mobilization, Scar mobilization, Compression bandaging, DME instructions, Wheelchair mobility training, Cryotherapy, and Moist heat  PLAN FOR NEXT SESSION: any questions over tricep HEP? sit to stands, standing tolerance with stedy, in // bars, possibly with RW, core strengthening/stability, endurance; wants to have conversation about taking a break (3-6 months) after this POC near end of this POC, standing lateral weight shifts, mini-squats in standing?, Yoga block press-ups vs push-up blocks, long-sitting - unsupported for core work?, bump transfers with and without slide board - pt wants to work on this skill!, lateral leans   Earlean Glaze, PT,  DPT     11/28/2023, 1:54 PM

## 2023-11-29 ENCOUNTER — Other Ambulatory Visit: Payer: Self-pay | Admitting: Family

## 2023-11-30 ENCOUNTER — Ambulatory Visit: Admitting: Occupational Therapy

## 2023-11-30 ENCOUNTER — Ambulatory Visit: Admitting: Physical Therapy

## 2023-11-30 DIAGNOSIS — L821 Other seborrheic keratosis: Secondary | ICD-10-CM | POA: Diagnosis not present

## 2023-11-30 DIAGNOSIS — M25641 Stiffness of right hand, not elsewhere classified: Secondary | ICD-10-CM | POA: Diagnosis not present

## 2023-11-30 DIAGNOSIS — D485 Neoplasm of uncertain behavior of skin: Secondary | ICD-10-CM | POA: Diagnosis not present

## 2023-11-30 DIAGNOSIS — M25642 Stiffness of left hand, not elsewhere classified: Secondary | ICD-10-CM

## 2023-11-30 DIAGNOSIS — D229 Melanocytic nevi, unspecified: Secondary | ICD-10-CM | POA: Diagnosis not present

## 2023-11-30 DIAGNOSIS — L570 Actinic keratosis: Secondary | ICD-10-CM | POA: Diagnosis not present

## 2023-11-30 DIAGNOSIS — R29818 Other symptoms and signs involving the nervous system: Secondary | ICD-10-CM | POA: Diagnosis not present

## 2023-11-30 DIAGNOSIS — R29898 Other symptoms and signs involving the musculoskeletal system: Secondary | ICD-10-CM

## 2023-11-30 DIAGNOSIS — R293 Abnormal posture: Secondary | ICD-10-CM | POA: Diagnosis not present

## 2023-11-30 DIAGNOSIS — M6281 Muscle weakness (generalized): Secondary | ICD-10-CM

## 2023-11-30 DIAGNOSIS — R2689 Other abnormalities of gait and mobility: Secondary | ICD-10-CM | POA: Diagnosis not present

## 2023-11-30 DIAGNOSIS — M24541 Contracture, right hand: Secondary | ICD-10-CM

## 2023-11-30 DIAGNOSIS — G8254 Quadriplegia, C5-C7 incomplete: Secondary | ICD-10-CM

## 2023-11-30 DIAGNOSIS — L814 Other melanin hyperpigmentation: Secondary | ICD-10-CM | POA: Diagnosis not present

## 2023-11-30 DIAGNOSIS — D1801 Hemangioma of skin and subcutaneous tissue: Secondary | ICD-10-CM | POA: Diagnosis not present

## 2023-11-30 DIAGNOSIS — R208 Other disturbances of skin sensation: Secondary | ICD-10-CM | POA: Diagnosis not present

## 2023-11-30 DIAGNOSIS — L578 Other skin changes due to chronic exposure to nonionizing radiation: Secondary | ICD-10-CM | POA: Diagnosis not present

## 2023-11-30 DIAGNOSIS — R278 Other lack of coordination: Secondary | ICD-10-CM | POA: Diagnosis not present

## 2023-11-30 DIAGNOSIS — M24542 Contracture, left hand: Secondary | ICD-10-CM

## 2023-11-30 NOTE — Patient Instructions (Signed)
 RIGHT HANDED TYPING WORDS pink oink pill hike hip omen play plan king noon plug jug mom pop mop him moon lime poultry fiction lint bundle typing nominal your boil coil soil numb pick lick humble jump hump bunk funk plump dump out tuition motion notion potion lotion jungle buy book hook nun none    Typing:  Left-handed Words  wear were are bear bare care dear deer stare react dare  sad gear great rate  date red read fear free tea tear fast waste treat tease zest wax cast vase vast cat basket earn casket bee seat ate gas rare seed tax fax feed cart art tart taste ear here

## 2023-11-30 NOTE — Therapy (Signed)
 OUTPATIENT PHYSICAL THERAPY NEURO TREATMENT   Patient Name: [name] MRN: [id number] DOB:[birth year], 73 y.o., female Today's Date: 11/30/2023   PCP: Reginal Capra, MD REFERRING PROVIDER: Celia Coles, MD   END OF SESSION:    PT End of Session - 11/30/23 1018     Visit Number 23    Number of Visits 29   recert   Date for PT Re-Evaluation 01/01/24   to allow for scheduling delays   Authorization Type [employer] MEDICARE    PT Start Time 1016    PT Stop Time 1100    PT Time Calculation (min) 44 min    Equipment Utilized During Treatment Gait belt    Activity Tolerance Patient tolerated treatment well    Behavior During Therapy WFL for tasks assessed/performed                        Past Medical History:  Diagnosis Date   CERVICAL POLYP 03/11/2008   Qualifier: Diagnosis of  By: Ethel Henry MD, Joaquim Muir    Colon polyps 2005   on colonscopy Dr. Sandrea Cruel   Fibroid 2004   Per Dr. Alva Jewels   History of shingles    face and mouth   Hx of skin cancer, basal cell    Rosacea    Sciatica of left side 09/28/2013   Scoliosis    noted on mri done for back pain   Past Surgical History:  Procedure Laterality Date   BUNIONECTOMY     Patient Active Problem List   Diagnosis Date Noted   Buttock wound, left, subsequent encounter 03/16/2023   Bronchiectasis with acute exacerbation (HCC) 03/15/2023   Buttock wound, left, initial encounter 11/02/2022   Orthostatic hypotension 08/13/2022   Neurogenic bowel 05/03/2022   Spasticity 05/03/2022   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/03/2022   Nerve pain 05/03/2022   Medication monitoring encounter 01/08/2022   Neurogenic bladder 10/11/2021   Urinary incontinence 10/11/2021   ESBL (extended spectrum beta-lactamase) producing bacteria infection 10/09/2021   Recurrent UTI 10/09/2021   Quadriplegia, C5-C7 incomplete (HCC) 01/16/2021   History of spinal fracture 01/16/2021   Suprapubic catheter (HCC) 01/16/2021   Encounter for  routine gynecological examination 09/28/2013   Onychomycosis 09/28/2013   Foot deformity, acquired 03/26/2012   Encounter for preventive health examination 12/25/2010   ROSACEA 08/25/2009   Disturbance in sleep behavior 03/11/2008   SKIN CANCER, HX OF 03/11/2008   DYSURIA, HX OF 03/11/2008   Hyperlipidemia 02/10/2007   CERVICALGIA 02/10/2007    ONSET DATE: 08/29/2023 (referral date)  REFERRING DIAG: G82.54 (ICD-10-CM) - Incomplete quadriplegia at C5-C8 level (HCC)  THERAPY DIAG:  Muscle weakness (generalized)  Other symptoms and signs involving the nervous system  Other symptoms and signs involving the musculoskeletal system  Quadriplegia, C5-C7 incomplete (HCC)  Rationale for Evaluation and Treatment: Rehabilitation  SUBJECTIVE:  SUBJECTIVE STATEMENT: Pt reports no acute changes since last visit. She did have some difficulty standing the other day in therapy with decreased UE support, worried about why it is more difficult now.  From initial eval: Pt familiar to this clinic, last seen Oct-Nov 2024 but has been seen for multiple POCs since her initial injury in 2022. Pt returns to this clinic after being in [place]  for the past few months. While in [place]  in December 2024 patient had a fall where she slid forwards out of her wheelchair onto the floor, ended up fracturing both of her femurs. Pt was hospitalized for 10-11 days and had surgical repair of her femurs. Pt reports she has been cleared of all restrictions since surgery, does have ongoing swelling in both legs and worsened spasticity. Pt reports that the spasticity has slightly improved since it initially started, was told by Dr. Lovorn the swelling may not resolve for 6-8 months (it has been 3 months) and not sure if spasticity will resolve  but patient is hopeful that if her swelling improves her spasticity will improve as well.  Pt asking about other options to help manage the swelling her legs, has tried variable compression stockings (knee high) and her PTs in [place]  recommended tight shapewear. Pt reports that the knee-high compression stockings led to increased swelling in her knees and the shapewear did not help her swelling. Encouraged patient to get thigh-high compression stockings and elevate her LE in PWC. Pt has ordered an articulating bed that will get here next Monday (3/31) so she can better elevate her legs when in bed.  Pt is also concerned about decreased ROM in her legs, [name] works on stretching her legs frequently but she feels she has more motion in her LLE as compared to RLE and may have mild foot drop on her R side. Pt does have PRAFOs to wear at night. Pt has worked up to standing in her standing frame x 30-40 min at a time. Pt also worked on standing with her PT and in // bars in [place] . Pt also reports with her injuries she lost the ability to lock/unlock her R knee but that it is getting better. She reports she lost a lot of stamina during her hospital stay as well.  Pt also has a new wound since last seen in this clinic in her L gluteal fold, shearing injury. Pt was seeing wound care in [place]  and is scheduled to see wound care with [facility] early April (was not able to schedule with [facility] wound care until late April).  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 Yes: NPRS scale: 3-4 Pain location: elbows to fingertips on both arms Pain description: nerve pain Aggravating factors: not stated Relieving factors: not stated  PRECAUTIONS: Fall and Other: osteoporosis  RED FLAGS: None   WEIGHT BEARING RESTRICTIONS: No  FALLS: Has patient fallen in last 6 months? Yes. Number of  falls 1 fall in [place]  that resulted in B femur fractures  LIVING ENVIRONMENT: Lives with: lives with their spouse and and with full-time caregiver [name] Lives in: House/apartment Home is power wheelchair accessible Has following equipment at home: Wheelchair (power), Wheelchair (manual), Grab bars, Ramped entry, and standing frame, slide board  PLOF: Independent with household mobility with device, Independent with community mobility with device, Requires assistive device for independence, Needs assistance with ADLs,  and Needs assistance with transfers  PATIENT GOALS: still working on stand and pivot with goal to pivot to commode or to a chair work on core-will help me with standing improve my stamina - being in the hospital I lost strength/endurance   OBJECTIVE:  Note: Objective measures were completed at Evaluation unless otherwise noted.  DIAGNOSTIC FINDINGS: None update/relevant to this POC  COGNITION: Overall cognitive status: Within functional limits for tasks assessed   SENSATION: Decreased sensation in BUE and BLE secondary to incomplete quadriplegia Decreased sensation in proximal LLE as compared to distal LE  EDEMA:  Circumferential: R knee: 17; L knee: 17.5 and Figure 8: R ankle 21, L ankle 21.5  MUSCLE TONE: increased spasticity in BLE   POSTURE: rounded shoulders and forward head  LOWER EXTREMITY ROM:     Passive  Right Eval Left Eval  Hip flexion Tight hip flexors Tight hip flexors  Hip extension    Hip abduction    Hip adduction    Hip internal rotation    Hip external rotation    Knee flexion Tight HS Tight HS  Knee extension    Ankle dorsiflexion Decreased, tight gastroc Decreased, tight gastroc  Ankle plantarflexion    Ankle inversion    Ankle eversion     (Blank rows = not tested)  LOWER EXTREMITY MMT:    MMT Right Eval Left Eval  Hip flexion 1 2-  Hip extension    Hip abduction    Hip adduction    Hip internal rotation     Hip external rotation    Knee flexion 0 3  Knee extension 2- 2-  Ankle dorsiflexion 2- 3  Ankle plantarflexion    Ankle inversion    Ankle eversion    (Blank rows = not tested)  BED MOBILITY:  From previous POC: Sit to supine Mod A Supine to sit Mod A Rolling to Right Mod A Rolling to Left Mod A Undulating mattress for wound management on standard bed (elevated-so often doing uphill sliding board transfers); she would like to continue working on sitting up independently, she has been working on rolling, needs less assistance w/ this when someone props her leg into hooklying; would like something to help her pull her left leg to her butt for stretching as well as bed mobility.  TRANSFERS: From previous POC: Pt continues using combination of bump over, slide board, and depression (squat pivot) transfers.                                                                                                                              TREATMENT:   NMR: Sit to stand in // bars Initially unable to stand with max A, possibly due to BUE weakness as a result of Botox ? Placed sheet around patient's hips for improved ability to assist and [name] able to provide additional assist With +2 assist patient is able to stand Standing x 4 min, x 1 min Pt  able to progress to stand with just max A x 1 with sheet around hips In standing pt able to attempt to lock her R knee with some manual assist, able to unlock L knee  TherEx To work on BUE strengthening to decrease assist needed with sit to stand transfers: Bicep curls with 2# weighted dowel x 10 reps Forearm flexion/extension with wooden dowel x 10 reps Forearm supination/pronation with 1# dumbbell x 10 reps B  Verbally added to HEP   PATIENT EDUCATION: Education details: Continue stretching program and triceps strengthening, verbally added BUE strengthening exercises (see above) Person educated: Patient and Arts administrator Education method:  Explanation, Demonstration, Tactile cues, and Verbal cues Education comprehension: verbalized understanding and returned demonstration  HOME EXERCISE PROGRAM: Access Code: [email or web address] URL: [email or web address] Date: 11/22/2023 Prepared by: Lorita Rosa  Exercises - Seated Elbow Extension with Self-Anchored Resistance  - 1 x daily - 7 x weekly - 3 sets - 10 reps - Supine Elbow Flexion Extension with Dumbbell  - 1 x daily - 7 x weekly - 3 sets - 10 reps - Seated Single Arm Elbow Extension Push-up on Table  - 1 x daily - 7 x weekly - 3 sets - 10 reps - Wheelchair Push-Up (AKA)  - 1 x daily - 7 x weekly - 3 sets - 5 reps - Seated Biceps Curl  - 1 x daily - 7 x weekly - 3 sets - 10 reps - Forearm Pronation and Supination with Hammer  - 1 x daily - 7 x weekly - 3 sets - 10 reps - Seated Wrist Extension with Dumbbell  - 1 x daily - 7 x weekly - 3 sets - 10 reps  GOALS: Goals reviewed with patient? Yes  SHORT TERM GOALS: Target date: 10/07/2023  HEP to be established for stretching and strengthening as appropriate. Baseline: not established at initial eval, reviewed during PT POC - pt and [name] report it is going well at home (4/25) Goal status: MET  2.  Pt will perform sit to stand transfer with LRAD with mod A Baseline: max A to stedy (4/4), max A in // bars (4/25) Goal status: IN PROGRESS  3.  Pt to tolerate standing x 5 min with LRAD to demonstrate improved endurance Baseline: 30 sec in stedy (initial), 2:35 min in // bars (4/25) Goal status: IN PROGRESS  4.  Pt to demonstrate reduced edema in BLE with a reduction in circumference/figure 8 measurement by 0.5 from initial evaluation. Baseline: Circumferential: R knee: 17; L knee: 17.5 and Figure 8: R ankle 21, L ankle 21.5 4/25: R knee 16, L knee: 16 11/16, R ankle: 21, L ankle 20 Goal status: IN PROGRESS   LONG TERM GOALS: Target date: 11/04/2023  Patient and her caregiver to be independent with performance  of HEP for stretching and strengthening as appropriate. Baseline: not established at initial eval, reviewed during PT POC - pt and [name] report it is going well at home (4/25) Goal status: MET  2.  Pt will perform sit to stand transfer with LRAD with min A Baseline: max A to stedy (4/4), max A in // bars (4/25), max A in // bars (5/22) Goal status: NOT MET  3.  Pt to tolerate standing x 5 min with LRAD to demonstrate improved endurance Baseline: 30 sec in stedy (initial), 2:35 min in // bars (4/25), 3 min in // bars (5/22) Goal status: NOT MET  4.  Pt to demonstrate reduced edema in BLE with a reduction  in circumference/figure 8 measurement by 1 from initial evaluation. Baseline: Circumferential:  R knee: 17; L knee: 17.5 and Figure 8: R ankle 21, L ankle 21.5 4/25: R knee 16, L knee: 16 11/16, R ankle: 21, L ankle 20 5/30: R knee: 16, L knee: 16, R ankle: 20.5, L ankle: 20 Goal status: PARTIALLY MET   NEW SHORT TERM GOALS:   Target date: 12/04/2023  Pt will perform sit to stand transfer with LRAD with mod A Baseline: max A to stedy (4/4), max A in // bars (4/25), max A in // bars (5/22), max A to +2 in // bars (6/18) Goal status: NOT MET  2.  Pt to tolerate standing x 4 min with LRAD to demonstrate improved endurance Baseline: 30 sec in stedy (initial), 2:35 min in // bars (4/25), 3 min in // bars (5/22), 4 min in // bars (6/18) Goal status: MET  3.  Pt will progress to performing bump transfers with mod A at the most for increased independence with functional mobility Baseline: up to max A (5/30), min A (6/18) Goal status: MET   NEW LONG TERM GOALS:  Target date: 12/26/2023  Pt will perform sit to stand transfer with LRAD with min A Baseline: max A to stedy (4/4), max A in // bars (4/25), max A in // bars (5/22), max A to +2 in // bars (6/18) Goal status: REVISED  2.  Pt to tolerate standing x 5 min with LRAD to demonstrate improved endurance Baseline: 30 sec in  stedy (initial), 2:35 min in // bars (4/25), 3 min in // bars (5/22), 4 min in // bars (6/18) Goal status: REVISED  3.  Pt will progress to performing bump transfers with min A at the most for increased independence with functional mobility Baseline: up to max A (5/30), min A (6/18) Goal status: MET  4.  Pt will be able to maintain dynamic sitting balance in long-sit position x 5 min while performing a functional task with no more than mod A Baseline: max A (5/30) Goal status: INITIAL    ASSESSMENT:  CLINICAL IMPRESSION: Emphasis of skilled PT session on assessing STG, working on sit to stands, and working on [employer]. Pt has met 2/3 STG due to being able to stand x 4 min in // bars (with max A) and being able to perform bump transfers with min A. She requires increased assist to perform sit to stands this date as compared to previous sessions, unsure if related to her fatigue level this date or possibly weakness in her forearms as a result of Botox  injections. She is discouraged by increased difficulty performing sit to stands this date. She continues to benefit from skilled PT services to work towards increased safety and independence with functional mobility. Will continue per POC.    OBJECTIVE IMPAIRMENTS: decreased balance, decreased endurance, decreased mobility, difficulty walking, decreased ROM, decreased strength, increased edema, impaired perceived functional ability, increased muscle spasms, impaired flexibility, impaired sensation, impaired tone, impaired UE functional use, postural dysfunction, and pain.   ACTIVITY LIMITATIONS: carrying, lifting, bending, standing, stairs, transfers, bed mobility, continence, bathing, toileting, dressing, reach over head, and hygiene/grooming  PARTICIPATION LIMITATIONS: meal prep, cleaning, laundry, driving, shopping, community activity, and occupation  PERSONAL FACTORS: Age, Sex, Time since onset of injury/illness/exacerbation, and  1-2 comorbidities:   C7 ASIA C- incomplete quad w/ neurogenic bladder and bowel, HLD, Hx of skin cancerare also affecting patient's functional outcome.   REHAB POTENTIAL: Good  CLINICAL DECISION MAKING:  Stable/uncomplicated  EVALUATION COMPLEXITY: High  PLAN:  PT FREQUENCY: 2x/week  PT DURATION: 8 weeks  PLANNED INTERVENTIONS: [id number]- PT Re-evaluation, 97110-Therapeutic exercises, 97530- Therapeutic activity, 97112- Neuromuscular re-education, 97535- Self Care, 98119- Manual therapy, [phone]- Gait training, [phone]- Orthotic Fit/training, [phone]- Electrical stimulation (manual), Patient/Family education, Balance training, Stair training, Taping, Dry Needling, Joint mobilization, Scar mobilization, Compression bandaging, DME instructions, Wheelchair mobility training, Cryotherapy, and Moist heat  PLAN FOR NEXT SESSION: sit to stands, standing tolerance with stedy, in // bars, possibly with RW, core strengthening/stability, endurance; wants to have conversation about taking a break (3-6 months) after this POC near end of this POC, standing lateral weight shifts, mini-squats in standing?, Yoga block press-ups vs push-up blocks, long-sitting - unsupported for core work?, bump transfers with and without slide board - pt wants to work on this skill!, lateral leans, work on mini squats in standing frame   Lorita Rosa, PT Lorita Rosa, PT, DPT, CSRS      11/30/2023, 11:01 AM

## 2023-11-30 NOTE — Therapy (Unsigned)
 OUTPATIENT OCCUPATIONAL THERAPY NEURO TREATMENT   Patient Name: [name] MRN: [id number] DOB:02-18-1951, 73 y.o., female Today's Date: 11/30/2023  PCP: Reginal Capra, MD  REFERRING PROVIDER: Celia Coles, MD  END OF SESSION:  OT End of Session - 11/30/23 1403     Visit Number 18    Number of Visits 32    Date for OT Re-Evaluation 01/27/24    Authorization Type [employer] - re-auth submitted    Authorization Time Period 09/09/2023 - 11/04/2023    OT Start Time 1104    OT Stop Time 1145    OT Time Calculation (min) 41 min    Activity Tolerance Patient tolerated treatment well    Behavior During Therapy [facility] for tasks assessed/performed         Past Medical History:  Diagnosis Date   CERVICAL POLYP 03/11/2008   Qualifier: Diagnosis of  By: Ethel Henry MD, Joaquim Muir    Colon polyps 2005   on colonscopy Dr. Sandrea Cruel   Fibroid 2004   Per Dr. Alva Jewels   History of shingles    face and mouth   Hx of skin cancer, basal cell    Rosacea    Sciatica of left side 09/28/2013   Scoliosis    noted on mri done for back pain   Past Surgical History:  Procedure Laterality Date   BUNIONECTOMY     Patient Active Problem List   Diagnosis Date Noted   Buttock wound, left, subsequent encounter 03/16/2023   Bronchiectasis with acute exacerbation (HCC) 03/15/2023   Buttock wound, left, initial encounter 11/02/2022   Orthostatic hypotension 08/13/2022   Neurogenic bowel 05/03/2022   Spasticity 05/03/2022   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/03/2022   Nerve pain 05/03/2022   Medication monitoring encounter 01/08/2022   Neurogenic bladder 10/11/2021   Urinary incontinence 10/11/2021   ESBL (extended spectrum beta-lactamase) producing bacteria infection 10/09/2021   Recurrent UTI 10/09/2021   Quadriplegia, C5-C7 incomplete (HCC) 01/16/2021   History of spinal fracture 01/16/2021   Suprapubic catheter (HCC) 01/16/2021   Encounter for routine gynecological examination 09/28/2013    Onychomycosis 09/28/2013   Foot deformity, acquired 03/26/2012   Encounter for preventive health examination 12/25/2010   ROSACEA 08/25/2009   Disturbance in sleep behavior 03/11/2008   SKIN CANCER, HX OF 03/11/2008   DYSURIA, HX OF 03/11/2008   Hyperlipidemia 02/10/2007   CERVICALGIA 02/10/2007    ONSET DATE: 07/28/2020  Date of Referral 08/29/2023   REFERRING DIAG: G82.54 (ICD-10-CM) - Quadriplegia, C5-C8, incomplete  THERAPY DIAG:  Other lack of coordination  Muscle weakness (generalized)  Stiffness of left hand, not elsewhere classified  Stiffness of right hand, not elsewhere classified  Other symptoms and signs involving the nervous system  Other symptoms and signs involving the musculoskeletal system  Contracture of hand joint, left  Contracture of hand joint, right  Rationale for Evaluation and Treatment: Rehabilitation  SUBJECTIVE:   SUBJECTIVE STATEMENT: Pt reports she has been able to use Surface Pro mouse with better outcomes.   Pt accompanied by: Caregiver, [name]   PERTINENT HISTORY: Pt is a 73 yr old L handed female with hx of incomplete quadriplegia- 2/14 2022- fleeing the police in [place] on passenger 100 (high speed) miles/hour,  [name]; neurogenic bowel and bladder and spasticity; [name], [name] Here for f/u on Incomplete quadriplegia  B femur fractures December, 2024.  PRECAUTIONS: Fall; suprapubic catheter (she wants to get this removed meaning she needs to get to and from the toilet);  she has had minor heat sensation when needing to complete her bowel program-possible AD?   WEIGHT BEARING RESTRICTIONS: No  PAIN: - reports average pain as noted below. Will notify therapist if there are changes in her pain.  Are you having pain? Yes: NPRS scale: 3/10 Pain location: fingers to elbow bilaterally Pain description: constant Aggravating factors: it can increased over time ie) is worse at the end of the day.  Also cold  affects cramps and function. Relieving factors: gabapentin  and baclofen  for spasms, nightly stretching  FALLS: Has patient fallen in last 6 months? Yes. Number of falls 1  LIVING ENVIRONMENT: Lives with: lives with their family - husband [name] and with an adult companion s/p moving back up from [place]  x10 months Lives in: House/apartment Stairs: 4 story town house with an Engineer, structural with threshold adjustments, roll in shower with transport chair Has following equipment at home: Wheelchair (power) - with seat height adjustments to access counters and reclining option, Wheelchair (manual), transport WC, shower chair, and Ramped entry, handheld showerhead with rails around toilet, had Alen Amy but is no longer in need of it, has slide boards x3  PLOF: Requires assistive device for independence, Needs assistance with ADLs, Needs assistance with homemaking, Needs assistance with gait, and Needs assistance with transfers; 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  Used to like to knit, sew and bake.  PATIENT GOALS: improve spasticity and use of hands  OBJECTIVE:   HAND DOMINANCE: Left  ADLs: Overall ADLs: Patient has a live in caregiver  Transfers/ambulation related to ADLs: min assist with sliding board transfers.  Eating: Has a rocker knife that she can use. Used to use adapted utensils but now uses regular utensils but still will get assistance to cut food ie) when eating out.  Grooming: can brush her own hair with LUE only; unable to manage jewelry ie) earrings  UB Dressing: can zip/unzip after it has been started, unable to manage buttons herself, Caregiver assists but if she has extra time, she can put on her bra, and a loose fitting pullover shirt/t-shirt  LB Dressing: dependent for LB dressing in bed and with special sock donner for LE compression garments   Toileting: bladder trained with suprapubic catheter which she clamps off.  Dependent for bowel incontinence  care.  Bathing: Sponge bath with adult washclothes.  Can bathe UB with back scrubber for most of her back.  Needs help with feet (mentioned she might need a separate brush for feet)   Tub Shower transfers: Min assist with slide board to wheel in shower chair  Equipment: Shower seat with back, Walk in shower, bed side commode, Reacher, Sock aid, Long handled sponge, and Feeding equipment  IADLs: --  Shopping: Assisted by caregiver  Light housekeeping: Has housekeeper that comes monthly  Meal Prep: previously enjoyed baking. Assisted by caregiver but has reheated a meal for herself after getting food out of the fridge/freezer from her WC.  Community mobility: Dependent  Medication management: Caregiver sorts them into pillbox but she is very aware of her medications   Financial management: Patient manages her own finances  Handwriting: Increased time and has a pen with a little grip  MOBILITY STATUS: Independent with power mobility  ACTIVITY TOLERANCE: Activity tolerance: good to Fair - MMT WFL but has limited sustained tolerance for ongoing use of Ues with poor trunk control  FUNCTIONAL OUTCOME MEASURES:  PSFS: 3.3 total score  10/12/2023: 3.7 total score   Total score = sum  of the activity scores/number of activities Minimum detectable change (90%CI) for average score = 2 points Minimum detectable change (90%CI) for single activity score = 3 points   UPPER EXTREMITY ROM:   AROM - WFL without obvious contractures, some digital flexion noted but PROM WNL   UPPER EXTREMITY MMT:   Grossly WFL - Endurance limited R tricep strength > than L but L UE generally stronger than R UE  MMT Right (eval) Left (eval)  Shoulder flexion 4/5 4/5  Shoulder abduction 4/5 4/5  Elbow flexion 4/5 4/5  Elbow extension 4/5 4/5  (Blank rows = not tested)  HAND FUNCTION: Grip strength: Right: 4.4 lbs (decline) ; Left: 18 lbs (slight improvement)  COORDINATION: 09/29/23 s/p Botox  injections  yesterday  Left: 56.13 sec Right 3:39.58 min  SENSATION: Light touch: Impaired  - patient   EDEMA: NA for UEs but LE has poor lymph drainage with custom compression garments   MUSCLE TONE: Generally WFL   COGNITION: Overall cognitive status: Within functional limits for tasks assessed  VISION: Subjective report: Patent wears progressive lens/glasses.  Denies diplopia or vision changes. Baseline vision: Wears glasses all the time  VISION ASSESSMENT: WFL  OBSERVATIONS: Patient independent with power WC navigation within clinic.  Patient is well-kept with foley catheter in place.  She has slight limitations in full extension of digits but PROM is WNL.    TODAY'S TREATMENT:      - Self-care/home management completed for duration as noted below including: OT initiated Left handed and Right handed typing words as noted in pt instructions to focus on individual hand typing.   Splinting/Orthotic  Patient mentioned issue with L fingers not being flat in her splints and B splints were checked.  L splint modified with some low pile foam in the finger pan to help her fingers stay in extended position better.  Additional strapping added to R splint to keep fingers abducted. Velcro super glued to splinting material to better keep them in place with overall wear and tear.   PATIENT EDUCATION: Education details: Putty exercise for fingers Person educated: Patient and Caregiver - Programmer, systems Education method: Explanation, Demonstration, and Verbal cues Education comprehension: verbalized understanding, returned demonstration, verbal cues required, and needs further education  HOME EXERCISE PROGRAM: Previously issued HEP per DC 12/02/22: All previous HEPs combined to 1 complete List through [employer] Access Code: [email or web address] 10/10/2023: Alternative [employer] application guidance  GOALS:   SHORT TERM GOALS: Target date: 12/22/2023   1. Patient will verbalize understanding of AE/modified techniques to  improve independence and safety with ADL and IADL completion. Baseline: Caregiver/spouse assist Goal status: IN PROGRESS  2.  Pt will be independent with BUE braces/splints as needed to prevent contracture and improve functional use of hands.  Baseline: Caregiver/spouse assist Goal status: MET  LONG TERM GOALS: Target date: 01/27/24   Patient will demonstrate independence with updated HEP for UE strengthening, coordination and ROM to prevent contractures and maintain strength for transfers and ADLs. Baseline: Previous HEPs have been established but need to be reviewed and updated.  Goal status: IN Progress  2.  Patient will report at least two-point increase in average PSFS score or at least three-point increase in a single activity score indicating functionally significant improvement given minimum detectable change. Baseline: 3.3 total score (See above for individual activity scores)  Goal status: IN Progress  3.  Patient will demonstrate at least 10 lbs R grip strength as needed to open jars and other containers. Baseline: 4.4 lbs 09/28/23: Botox   injections  10/12/2023: R - 1.7 lbs; L - 4.1, 7.4 lbs Goal status: IN PROGRESS  ASSESSMENT:  CLINICAL IMPRESSION: Patient seen today for occupational therapy today with attention to FM tasks for typing and adjustments to B resting hand splints for improved fit. Pt continues to benefit from ongoing skilled OT services in the outpatient setting to work on impairments in B UE ROM, strength and coordination to help pt return to highest level of independence with self care, work and leisure activities.    PERFORMANCE DEFICITS: in functional skills including ADLs, IADLs, coordination, dexterity, strength, muscle spasms, Fine motor control, Gross motor control, continence, skin integrity, and UE functional use,   IMPAIRMENTS: are limiting patient from ADLs, IADLs, work, and leisure.   CO-MORBIDITIES: has co-morbidities such as incontinence and  wound that affects occupational performance. Patient will benefit from skilled OT to address above impairments and improve overall function.  REHAB POTENTIAL: Fair due to chronicity of injury  PLAN:  OT FREQUENCY: 1-2x/week   OT DURATION: Additional 8 weeks  PLANNED INTERVENTIONS: self care/ADL training, therapeutic exercise, therapeutic activity, neuromuscular re-education, manual therapy, passive range of motion, balance training, functional mobility training, splinting, patient/family education, energy conservation, coping strategies training, and DME and/or AE instructions  RECOMMENDED OTHER SERVICES: Patient was seen for PT evaluation today with treatment plans coordinated for 2x/week.  CONSULTED AND AGREED WITH PLAN OF CARE: Patient and family member/caregiver  PLAN FOR NEXT SESSION: Resting hand splint adjustments  NMES review second unit reciprocal option PRN  Review computer adaptions PRN   Check splints PRN Review/progress HEPs Explore FM tasks and establish weight shifting instruction for pressure relief.  Cutting, drink opening, hair  other ADL AE (jar opener)  Altamease Asters, OT 11/30/2023, 2:04 PM

## 2023-12-05 ENCOUNTER — Other Ambulatory Visit: Payer: Self-pay | Admitting: "Endocrinology

## 2023-12-05 ENCOUNTER — Encounter: Admitting: Occupational Therapy

## 2023-12-05 ENCOUNTER — Ambulatory Visit: Admitting: Physical Therapy

## 2023-12-06 ENCOUNTER — Ambulatory Visit: Admitting: Podiatry

## 2023-12-06 ENCOUNTER — Encounter: Payer: Self-pay | Admitting: Podiatry

## 2023-12-06 VITALS — Ht 64.0 in | Wt 126.0 lb

## 2023-12-06 DIAGNOSIS — M79674 Pain in right toe(s): Secondary | ICD-10-CM | POA: Diagnosis not present

## 2023-12-06 DIAGNOSIS — B351 Tinea unguium: Secondary | ICD-10-CM

## 2023-12-06 DIAGNOSIS — M79675 Pain in left toe(s): Secondary | ICD-10-CM

## 2023-12-06 NOTE — Progress Notes (Signed)
  Subjective:  Patient ID: [name], female    DOB: [birth year],  MRN: [id number]  Chief Complaint  Patient presents with   Nail Problem    Pain due to onychomycosis of toenails of both feet, RFC        73 y.o. female presents with the above complaint. History confirmed with patient.  Doing well they have been consistently using compression stockings to thigh-high.  Nails are thick and elongated and causing discomfort again.  Objective:  Physical Exam: Edema is controlled .  Skin temperature and texture is normal, pulses are palpable.  Yellowed elongated thickened nails subungual debris and dystrophy x 10.  No active ulceration or signs of infection.  Assessment:   1. Pain due to onychomycosis of toenails of both feet        Plan:  Patient was evaluated and treated and all questions answered.  Discussed the etiology and treatment options for the condition in detail with the patient. Recommended debridement of the nails today. Sharp and mechanical debridement performed of all painful and mycotic nails today. Nails debrided in length and thickness using a nail nipper to level of comfort. Discussed treatment options including appropriate shoe gear. Follow up as needed for painful nails.  No ulcerations.  Continue daily foot inspections.  Edema and skin texture has improved  Return in about 12 weeks (around 02/28/2024) for painful thick fungal nails.

## 2023-12-07 ENCOUNTER — Ambulatory Visit: Admitting: Occupational Therapy

## 2023-12-07 ENCOUNTER — Ambulatory Visit: Admitting: Physical Therapy

## 2023-12-07 ENCOUNTER — Encounter: Payer: Self-pay | Admitting: Physical Therapy

## 2023-12-07 DIAGNOSIS — M25642 Stiffness of left hand, not elsewhere classified: Secondary | ICD-10-CM

## 2023-12-07 DIAGNOSIS — R278 Other lack of coordination: Secondary | ICD-10-CM | POA: Diagnosis not present

## 2023-12-07 DIAGNOSIS — M6281 Muscle weakness (generalized): Secondary | ICD-10-CM | POA: Diagnosis not present

## 2023-12-07 DIAGNOSIS — R2689 Other abnormalities of gait and mobility: Secondary | ICD-10-CM | POA: Diagnosis not present

## 2023-12-07 DIAGNOSIS — R208 Other disturbances of skin sensation: Secondary | ICD-10-CM | POA: Diagnosis not present

## 2023-12-07 DIAGNOSIS — R29818 Other symptoms and signs involving the nervous system: Secondary | ICD-10-CM

## 2023-12-07 DIAGNOSIS — M25641 Stiffness of right hand, not elsewhere classified: Secondary | ICD-10-CM | POA: Diagnosis not present

## 2023-12-07 DIAGNOSIS — R293 Abnormal posture: Secondary | ICD-10-CM | POA: Diagnosis not present

## 2023-12-07 DIAGNOSIS — R29898 Other symptoms and signs involving the musculoskeletal system: Secondary | ICD-10-CM | POA: Diagnosis not present

## 2023-12-07 NOTE — Therapy (Signed)
 OUTPATIENT PHYSICAL THERAPY NEURO TREATMENT   Patient Name: [name] MRN: [id number] DOB:06-Oct-1950, 73 y.o., female Today's Date: 12/07/2023   PCP: Carmen Apolinar POUR, MD REFERRING PROVIDER: [name], MD   END OF SESSION:    PT End of Session - 12/07/23 1309     Visit Number 24    Number of Visits 29   recert   Date for PT Re-Evaluation 01/01/24   to allow for scheduling delays   Authorization Type [employer] MEDICARE    PT Start Time 1105    PT Stop Time 1149    PT Time Calculation (min) 44 min    Equipment Utilized During Treatment Gait belt    Activity Tolerance Patient tolerated treatment well    Behavior During Therapy WFL for tasks assessed/performed                        Past Medical History:  Diagnosis Date   CERVICAL POLYP 03/11/2008   Qualifier: Diagnosis of  By: Charlett MD, Apolinar [name]    Colon polyps 2005   on colonscopy [name]   Fibroid 2004   Per [name]   History of shingles    face and mouth   Hx of skin cancer, basal cell    Rosacea    Sciatica of left side 09/28/2013   Scoliosis    noted on mri done for back pain   Past Surgical History:  Procedure Laterality Date   BUNIONECTOMY     Patient Active Problem List   Diagnosis Date Noted   Buttock wound, left, subsequent encounter 03/16/2023   Bronchiectasis with acute exacerbation (HCC) 03/15/2023   Buttock wound, left, initial encounter 11/02/2022   Orthostatic hypotension 08/13/2022   Neurogenic bowel 05/03/2022   Spasticity 05/03/2022   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/03/2022   Nerve pain 05/03/2022   Medication monitoring encounter 01/08/2022   Neurogenic bladder 10/11/2021   Urinary incontinence 10/11/2021   ESBL (extended spectrum beta-lactamase) producing bacteria infection 10/09/2021   Recurrent UTI 10/09/2021   Quadriplegia, C5-C7 incomplete (HCC) 01/16/2021   History of spinal fracture 01/16/2021   Suprapubic catheter (HCC) 01/16/2021   Encounter for  routine gynecological examination 09/28/2013   Onychomycosis 09/28/2013   Foot deformity, acquired 03/26/2012   Encounter for preventive health examination 12/25/2010   ROSACEA 08/25/2009   Disturbance in sleep behavior 03/11/2008   SKIN CANCER, HX OF 03/11/2008   DYSURIA, HX OF 03/11/2008   Hyperlipidemia 02/10/2007   CERVICALGIA 02/10/2007    ONSET DATE: 08/29/2023 (referral date)  REFERRING DIAG: G82.54 (ICD-10-CM) - Incomplete quadriplegia at C5-C8 level (HCC)  THERAPY DIAG:  Other lack of coordination  Muscle weakness (generalized)  Other symptoms and signs involving the nervous system  Other symptoms and signs involving the musculoskeletal system  Rationale for Evaluation and Treatment: Rehabilitation  SUBJECTIVE:  SUBJECTIVE STATEMENT: Pt reports scary event last night with elevator.  She is fine today.  She would like to work on stretching and upper body strength today as she is concerned about progressive UE tone and weakness.   From initial eval: Pt familiar to this clinic, last seen Oct-Nov 2024 but has been seen for multiple POCs since her initial injury in 2022. Pt returns to this clinic after being in [place]  for the past few months. While in [place]  in December 2024 patient had a fall where she slid forwards out of her wheelchair onto the floor, ended up fracturing both of her femurs. Pt was hospitalized for 10-11 days and had surgical repair of her femurs. Pt reports she has been cleared of all restrictions since surgery, does have ongoing swelling in both legs and worsened spasticity. Pt reports that the spasticity has slightly improved since it initially started, was told by [name] the swelling may not resolve for 6-8 months (it has been 3 months) and not sure if spasticity will  resolve but patient is hopeful that if her swelling improves her spasticity will improve as well.  Pt asking about other options to help manage the swelling her legs, has tried variable compression stockings (knee high) and her PTs in [place]  recommended tight shapewear. Pt reports that the knee-high compression stockings led to increased swelling in her knees and the shapewear did not help her swelling. Encouraged patient to get thigh-high compression stockings and elevate her LE in PWC. Pt has ordered an articulating bed that will get here next Monday (3/31) so she can better elevate her legs when in bed.  Pt is also concerned about decreased ROM in her legs, [name] works on stretching her legs frequently but she feels she has more motion in her LLE as compared to RLE and may have mild foot drop on her R side. Pt does have PRAFOs to wear at night. Pt has worked up to standing in her standing frame x 30-40 min at a time. Pt also worked on standing with her PT and in // bars in [place] . Pt also reports with her injuries she lost the ability to lock/unlock her R knee but that it is getting better. She reports she lost a lot of stamina during her hospital stay as well.  Pt also has a new wound since last seen in this clinic in her L gluteal fold, shearing injury. Pt was seeing wound care in [place]  and is scheduled to see wound care with [facility] early April (was not able to schedule with [facility] wound care until late April).  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 Yes: NPRS scale: 3-4 Pain location: elbows to fingertips on both arms Pain description: nerve pain Aggravating factors: not stated Relieving factors: not stated  PRECAUTIONS: Fall and Other: osteoporosis  RED FLAGS: None   WEIGHT BEARING RESTRICTIONS: No  FALLS: Has patient fallen in last 6 months? Yes.  Number of falls 1 fall in [place]  that resulted in B femur fractures  LIVING ENVIRONMENT: Lives with: lives with their spouse and and with full-time caregiver [name] Lives in: House/apartment Home is power wheelchair accessible Has following equipment at home: Wheelchair (power), Wheelchair (manual), Grab bars, Ramped entry, and standing frame, slide board  PLOF: Independent with household mobility with device, Independent with community mobility with device, Requires assistive device  for independence, Needs assistance with ADLs, and Needs assistance with transfers  PATIENT GOALS: still working on stand and pivot with goal to pivot to commode or to a chair work on core-will help me with standing improve my stamina - being in the hospital I lost strength/endurance   OBJECTIVE:  Note: Objective measures were completed at Evaluation unless otherwise noted.  DIAGNOSTIC FINDINGS: None update/relevant to this POC  COGNITION: Overall cognitive status: Within functional limits for tasks assessed   SENSATION: Decreased sensation in BUE and BLE secondary to incomplete quadriplegia Decreased sensation in proximal LLE as compared to distal LE  EDEMA:  Circumferential: R knee: 17; L knee: 17.5 and Figure 8: R ankle 21, L ankle 21.5  MUSCLE TONE: increased spasticity in BLE   POSTURE: rounded shoulders and forward head  LOWER EXTREMITY ROM:     Passive  Right Eval Left Eval  Hip flexion Tight hip flexors Tight hip flexors  Hip extension    Hip abduction    Hip adduction    Hip internal rotation    Hip external rotation    Knee flexion Tight HS Tight HS  Knee extension    Ankle dorsiflexion Decreased, tight gastroc Decreased, tight gastroc  Ankle plantarflexion    Ankle inversion    Ankle eversion     (Blank rows = not tested)  LOWER EXTREMITY MMT:    MMT Right Eval Left Eval  Hip flexion 1 2-  Hip extension    Hip abduction    Hip adduction    Hip internal  rotation    Hip external rotation    Knee flexion 0 3  Knee extension 2- 2-  Ankle dorsiflexion 2- 3  Ankle plantarflexion    Ankle inversion    Ankle eversion    (Blank rows = not tested)  BED MOBILITY:  From previous POC: Sit to supine Mod A Supine to sit Mod A Rolling to Right Mod A Rolling to Left Mod A Undulating mattress for wound management on standard bed (elevated-so often doing uphill sliding board transfers); she would like to continue working on sitting up independently, she has been working on rolling, needs less assistance w/ this when someone props her leg into hooklying; would like something to help her pull her left leg to her butt for stretching as well as bed mobility.  TRANSFERS: From previous POC: Pt continues using combination of bump over, slide board, and depression (squat pivot) transfers.                                                                                                                              TREATMENT:   TherAct: Pt left bumps minA for foot positioning to mat table > transfers to supine modA for LE swing over Supine stretching of BLE (hamstrings > knee flexion > DF > IR/ER > hip circles) Rolled into prone minA for LLE placement > stretching of BLE in prone (passive hip flexor  stretch > quads, tight bilaterally w/ some spasticity noted > DF) Pt practices 3 rounds of shoulder protraction in prone on elbows w/ minA to position LUE in line with shoulder  PATIENT EDUCATION: Education details: Continue stretching program and triceps strengthening Person educated: Patient and Arts administrator Education method: Explanation, Demonstration, Tactile cues, and Verbal cues Education comprehension: verbalized understanding and returned demonstration  HOME EXERCISE PROGRAM: Access Code: [email or web address] URL: [email or web address] Date: 11/22/2023 Prepared by: Waddell Southgate  Exercises - Seated Elbow Extension with Self-Anchored  Resistance  - 1 x daily - 7 x weekly - 3 sets - 10 reps - Supine Elbow Flexion Extension with Dumbbell  - 1 x daily - 7 x weekly - 3 sets - 10 reps - Seated Single Arm Elbow Extension Push-up on Table  - 1 x daily - 7 x weekly - 3 sets - 10 reps - Wheelchair Push-Up (AKA)  - 1 x daily - 7 x weekly - 3 sets - 5 reps - Seated Biceps Curl  - 1 x daily - 7 x weekly - 3 sets - 10 reps - Forearm Pronation and Supination with Hammer  - 1 x daily - 7 x weekly - 3 sets - 10 reps - Seated Wrist Extension with Dumbbell  - 1 x daily - 7 x weekly - 3 sets - 10 reps  GOALS: Goals reviewed with patient? Yes  SHORT TERM GOALS: Target date: 10/07/2023  HEP to be established for stretching and strengthening as appropriate. Baseline: not established at initial eval, reviewed during PT POC - pt and [name] report it is going well at home (4/25) Goal status: MET  2.  Pt will perform sit to stand transfer with LRAD with mod A Baseline: max A to stedy (4/4), max A in // bars (4/25) Goal status: IN PROGRESS  3.  Pt to tolerate standing x 5 min with LRAD to demonstrate improved endurance Baseline: 30 sec in stedy (initial), 2:35 min in // bars (4/25) Goal status: IN PROGRESS  4.  Pt to demonstrate reduced edema in BLE with a reduction in circumference/figure 8 measurement by 0.5 from initial evaluation. Baseline: Circumferential: R knee: 17; L knee: 17.5 and Figure 8: R ankle 21, L ankle 21.5 4/25: R knee 16, L knee: 16 11/16, R ankle: 21, L ankle 20 Goal status: IN PROGRESS   LONG TERM GOALS: Target date: 11/04/2023  Patient and her caregiver to be independent with performance of HEP for stretching and strengthening as appropriate. Baseline: not established at initial eval, reviewed during PT POC - pt and [name] report it is going well at home (4/25) Goal status: MET  2.  Pt will perform sit to stand transfer with LRAD with min A Baseline: max A to stedy (4/4), max A in // bars (4/25), max A in //  bars (5/22) Goal status: NOT MET  3.  Pt to tolerate standing x 5 min with LRAD to demonstrate improved endurance Baseline: 30 sec in stedy (initial), 2:35 min in // bars (4/25), 3 min in // bars (5/22) Goal status: NOT MET  4.  Pt to demonstrate reduced edema in BLE with a reduction in circumference/figure 8 measurement by 1 from initial evaluation. Baseline: Circumferential:  R knee: 17; L knee: 17.5 and Figure 8: R ankle 21, L ankle 21.5 4/25: R knee 16, L knee: 16 11/16, R ankle: 21, L ankle 20 5/30: R knee: 16, L knee: 16, R ankle: 20.5, L ankle: 20 Goal status: PARTIALLY MET  NEW SHORT TERM GOALS:   Target date: 12/04/2023  Pt will perform sit to stand transfer with LRAD with mod A Baseline: max A to stedy (4/4), max A in // bars (4/25), max A in // bars (5/22), max A to +2 in // bars (6/18) Goal status: NOT MET  2.  Pt to tolerate standing x 4 min with LRAD to demonstrate improved endurance Baseline: 30 sec in stedy (initial), 2:35 min in // bars (4/25), 3 min in // bars (5/22), 4 min in // bars (6/18) Goal status: MET  3.  Pt will progress to performing bump transfers with mod A at the most for increased independence with functional mobility Baseline: up to max A (5/30), min A (6/18) Goal status: MET   NEW LONG TERM GOALS:  Target date: 12/26/2023  Pt will perform sit to stand transfer with LRAD with min A Baseline: max A to stedy (4/4), max A in // bars (4/25), max A in // bars (5/22), max A to +2 in // bars (6/18) Goal status: REVISED  2.  Pt to tolerate standing x 5 min with LRAD to demonstrate improved endurance Baseline: 30 sec in stedy (initial), 2:35 min in // bars (4/25), 3 min in // bars (5/22), 4 min in // bars (6/18) Goal status: REVISED  3.  Pt will progress to performing bump transfers with min A at the most for increased independence with functional mobility Baseline: up to max A (5/30), min A (6/18) Goal status: MET  4.  Pt will be able  to maintain dynamic sitting balance in long-sit position x 5 min while performing a functional task with no more than mod A Baseline: max A (5/30) Goal status: INITIAL    ASSESSMENT:  CLINICAL IMPRESSION: Focus of skilled session today on stretching BLE to improve comfort, posture, and tone management.  Pt remains concerned about decreasing UE strength and feeling her UE tone is increasing.  Began reviewing prone exercise to improve this proximally.  She continues to benefit from skilled PT to address functional impairments and prevent further decline.  Continue per POC.    OBJECTIVE IMPAIRMENTS: decreased balance, decreased endurance, decreased mobility, difficulty walking, decreased ROM, decreased strength, increased edema, impaired perceived functional ability, increased muscle spasms, impaired flexibility, impaired sensation, impaired tone, impaired UE functional use, postural dysfunction, and pain.   ACTIVITY LIMITATIONS: carrying, lifting, bending, standing, stairs, transfers, bed mobility, continence, bathing, toileting, dressing, reach over head, and hygiene/grooming  PARTICIPATION LIMITATIONS: meal prep, cleaning, laundry, driving, shopping, community activity, and occupation  PERSONAL FACTORS: Age, Sex, Time since onset of injury/illness/exacerbation, and 1-2 comorbidities:   C7 ASIA C- incomplete quad w/ neurogenic bladder and bowel, HLD, Hx of skin cancerare also affecting patient's functional outcome.   REHAB POTENTIAL: Good  CLINICAL DECISION MAKING: Stable/uncomplicated  EVALUATION COMPLEXITY: High  PLAN:  PT FREQUENCY: 2x/week  PT DURATION: 8 weeks  PLANNED INTERVENTIONS: [id number]- PT Re-evaluation, 97110-Therapeutic exercises, 97530- Therapeutic activity, 97112- Neuromuscular re-education, 97535- Self Care, [id number]- Manual therapy, [phone]- Gait training, [phone]- Orthotic Fit/training, [phone]- Electrical stimulation (manual), Patient/Family education, Balance training, Stair  training, Taping, Dry Needling, Joint mobilization, Scar mobilization, Compression bandaging, DME instructions, Wheelchair mobility training, Cryotherapy, and Moist heat  PLAN FOR NEXT SESSION: sit to stands, standing tolerance with stedy, in // bars, possibly with RW, core strengthening/stability, endurance; wants to have conversation about taking a break (3-6 months) after this POC near end of this POC, standing lateral weight shifts, mini-squats in standing?, Yoga block press-ups vs  push-up blocks, long-sitting - unsupported for core work?, bump transfers with and without slide board - pt wants to work on this skill!, lateral leans, work on mini squats in standing frame   Daved KATHEE Bull, PT, DPT 12/07/2023, 1:11 PM

## 2023-12-07 NOTE — Therapy (Unsigned)
 OUTPATIENT OCCUPATIONAL THERAPY NEURO TREATMENT   Patient Name: [name] MRN: [id number] DOB:09/19/1950, 73 y.o., female Today's Date: 12/07/2023  PCP: [name], MD  REFERRING PROVIDER: [name], MD  END OF SESSION:  OT End of Session - 12/07/23 1024     Visit Number 18    Number of Visits 32    Date for OT Re-Evaluation 01/27/24    Authorization Type [employer] - re-auth submitted    Authorization Time Period 09/09/2023 - 11/04/2023    OT Start Time 1020    OT Stop Time 1100    OT Time Calculation (min) 40 min    Equipment Utilized During Treatment Key board    Activity Tolerance Patient tolerated treatment well    Behavior During Therapy [facility] for tasks assessed/performed         Past Medical History:  Diagnosis Date   CERVICAL POLYP 03/11/2008   Qualifier: Diagnosis of  By: Charlett MD, Apolinar POUR    Colon polyps 2005   on colonscopy Dr. Aneita   Fibroid 2004   Per Dr. Lenon   History of shingles    face and mouth   Hx of skin cancer, basal cell    Rosacea    Sciatica of left side 09/28/2013   Scoliosis    noted on mri done for back pain   Past Surgical History:  Procedure Laterality Date   BUNIONECTOMY     Patient Active Problem List   Diagnosis Date Noted   Buttock wound, left, subsequent encounter 03/16/2023   Bronchiectasis with acute exacerbation (HCC) 03/15/2023   Buttock wound, left, initial encounter 11/02/2022   Orthostatic hypotension 08/13/2022   Neurogenic bowel 05/03/2022   Spasticity 05/03/2022   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/03/2022   Nerve pain 05/03/2022   Medication monitoring encounter 01/08/2022   Neurogenic bladder 10/11/2021   Urinary incontinence 10/11/2021   ESBL (extended spectrum beta-lactamase) producing bacteria infection 10/09/2021   Recurrent UTI 10/09/2021   Quadriplegia, C5-C7 incomplete (HCC) 01/16/2021   History of spinal fracture 01/16/2021   Suprapubic catheter (HCC) 01/16/2021   Encounter for  routine gynecological examination 09/28/2013   Onychomycosis 09/28/2013   Foot deformity, acquired 03/26/2012   Encounter for preventive health examination 12/25/2010   ROSACEA 08/25/2009   Disturbance in sleep behavior 03/11/2008   SKIN CANCER, HX OF 03/11/2008   DYSURIA, HX OF 03/11/2008   Hyperlipidemia 02/10/2007   CERVICALGIA 02/10/2007    ONSET DATE: 07/28/2020  Date of Referral 08/29/2023   REFERRING DIAG: G82.54 (ICD-10-CM) - Quadriplegia, C5-C8, incomplete  THERAPY DIAG:  Other lack of coordination  Muscle weakness (generalized)  Stiffness of right hand, not elsewhere classified  Stiffness of left hand, not elsewhere classified  Rationale for Evaluation and Treatment: Rehabilitation  SUBJECTIVE:   SUBJECTIVE STATEMENT: Pt reports she has been able to use Surface Pro mouse with better outcomes.   Pt accompanied by: Caregiver, [name]   PERTINENT HISTORY: Pt is a 73 yr old L handed female with hx of incomplete quadriplegia- 2/14 2022- fleeing the police in [place] on passenger 100 (high speed) miles/hour,  [name]; neurogenic bowel and bladder and spasticity; [name], [name] Here for f/u on Incomplete quadriplegia  B femur fractures December, 2024.  PRECAUTIONS: Fall; suprapubic catheter (she wants to get this removed meaning she needs to get to and from the toilet); she has had minor heat sensation when needing to complete her bowel program-possible AD?   WEIGHT BEARING RESTRICTIONS: No  PAIN: - reports average pain as noted below. Will notify therapist if there are changes in her pain.  Are you having pain? Yes: NPRS scale: 3/10 Pain location: fingers to elbow bilaterally Pain description: constant Aggravating factors: it can increased over time ie) is worse at the end of the day.  Also cold affects cramps and function. Relieving factors: gabapentin  and baclofen  for spasms, nightly stretching  FALLS: Has patient fallen in last 6 months?  Yes. Number of falls 1  LIVING ENVIRONMENT: Lives with: lives with their family - husband [name] and with an adult companion s/p moving back up from [place]  x10 months Lives in: House/apartment Stairs: 4 story town house with an Engineer, structural with threshold adjustments, roll in shower with transport chair Has following equipment at home: Wheelchair (power) - with seat height adjustments to access counters and reclining option, Wheelchair (manual), transport WC, shower chair, and Ramped entry, handheld showerhead with rails around toilet, had Deitra but is no longer in need of it, has slide boards x3  PLOF: Requires assistive device for independence, Needs assistance with ADLs, Needs assistance with homemaking, Needs assistance with gait, and Needs assistance with transfers; 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  Used to like to knit, sew and bake.  PATIENT GOALS: improve spasticity and use of hands  OBJECTIVE:   HAND DOMINANCE: Left  ADLs: Overall ADLs: Patient has a live in caregiver  Transfers/ambulation related to ADLs: min assist with sliding board transfers.  Eating: Has a rocker knife that she can use. Used to use adapted utensils but now uses regular utensils but still will get assistance to cut food ie) when eating out.  Grooming: can brush her own hair with LUE only; unable to manage jewelry ie) earrings  UB Dressing: can zip/unzip after it has been started, unable to manage buttons herself, Caregiver assists but if she has extra time, she can put on her bra, and a loose fitting pullover shirt/t-shirt  LB Dressing: dependent for LB dressing in bed and with special sock donner for LE compression garments   Toileting: bladder trained with suprapubic catheter which she clamps off.  Dependent for bowel incontinence care.  Bathing: Sponge bath with adult washclothes.  Can bathe UB with back scrubber for most of her back.  Needs help with feet (mentioned she might need a  separate brush for feet)   Tub Shower transfers: Min assist with slide board to wheel in shower chair  Equipment: Shower seat with back, Walk in shower, bed side commode, Reacher, Sock aid, Long handled sponge, and Feeding equipment  IADLs: --  Shopping: Assisted by caregiver  Light housekeeping: Has housekeeper that comes monthly  Meal Prep: previously enjoyed baking. Assisted by caregiver but has reheated a meal for herself after getting food out of the fridge/freezer from her WC.  Community mobility: Dependent  Medication management: Caregiver sorts them into pillbox but she is very aware of her medications   Financial management: Patient manages her own finances  Handwriting: Increased time and has a pen with a little grip  MOBILITY STATUS: Independent with power mobility  ACTIVITY TOLERANCE: Activity tolerance: good to Fair - MMT WFL but has limited sustained tolerance for ongoing use of Ues with poor trunk control  FUNCTIONAL OUTCOME MEASURES:  PSFS: 3.3 total score  10/12/2023: 3.7 total score   Total score = sum of the activity scores/number of activities Minimum detectable change (90%CI) for average score = 2 points Minimum detectable change (90%CI) for  single activity score = 3 points   UPPER EXTREMITY ROM:   AROM - WFL without obvious contractures, some digital flexion noted but PROM WNL   UPPER EXTREMITY MMT:   Grossly WFL - Endurance limited R tricep strength > than L but L UE generally stronger than R UE  MMT Right (eval) Left (eval)  Shoulder flexion 4/5 4/5  Shoulder abduction 4/5 4/5  Elbow flexion 4/5 4/5  Elbow extension 4/5 4/5  (Blank rows = not tested)  HAND FUNCTION: Grip strength: Right: 4.4 lbs (decline) ; Left: 18 lbs (slight improvement)  COORDINATION: 09/29/23 s/p Botox  injections yesterday  Left: 56.13 sec Right 3:39.58 min  SENSATION: Light touch: Impaired  - patient   EDEMA: NA for UEs but LE has poor lymph drainage with custom  compression garments   MUSCLE TONE: Generally WFL   COGNITION: Overall cognitive status: Within functional limits for tasks assessed  VISION: Subjective report: Patent wears progressive lens/glasses.  Denies diplopia or vision changes. Baseline vision: Wears glasses all the time  VISION ASSESSMENT: WFL  OBSERVATIONS: Patient independent with power WC navigation within clinic.  Patient is well-kept with foley catheter in place.  She has slight limitations in full extension of digits but PROM is WNL.    TODAY'S TREATMENT:      Pt engaged in computer access activities to explore adaptation for computer mouse access.  Pt brought her   PATIENT EDUCATION: Education details: Putty exercise for fingers Person educated: Patient and Caregiver - Programmer, systems Education method: Explanation, Demonstration, and Verbal cues Education comprehension: verbalized understanding, returned demonstration, verbal cues required, and needs further education  HOME EXERCISE PROGRAM: Previously issued HEP per DC 12/02/22: All previous HEPs combined to 1 complete List through [employer] Access Code: [email or web address] 10/10/2023: Alternative [employer] application guidance  GOALS:   SHORT TERM GOALS: Target date: 12/22/2023   1. Patient will verbalize understanding of AE/modified techniques to improve independence and safety with ADL and IADL completion. Baseline: Caregiver/spouse assist Goal status: IN PROGRESS  2.  Pt will be independent with BUE braces/splints as needed to prevent contracture and improve functional use of hands.  Baseline: Caregiver/spouse assist Goal status: MET  LONG TERM GOALS: Target date: 01/27/24   Patient will demonstrate independence with updated HEP for UE strengthening, coordination and ROM to prevent contractures and maintain strength for transfers and ADLs. Baseline: Previous HEPs have been established but need to be reviewed and updated.  Goal status: IN Progress  2.  Patient will report at  least two-point increase in average PSFS score or at least three-point increase in a single activity score indicating functionally significant improvement given minimum detectable change. Baseline: 3.3 total score (See above for individual activity scores)  Goal status: IN Progress  3.  Patient will demonstrate at least 10 lbs R grip strength as needed to open jars and other containers. Baseline: 4.4 lbs 09/28/23: Botox  injections  10/12/2023: R - 1.7 lbs; L - 4.1, 7.4 lbs Goal status: IN PROGRESS  ASSESSMENT:  CLINICAL IMPRESSION: Patient seen today for occupational therapy today with attention to FM tasks for typing and adjustments to B resting hand splints for improved fit. Pt continues to benefit from ongoing skilled OT services in the outpatient setting to work on impairments in B UE ROM, strength and coordination to help pt return to highest level of independence with self care, work and leisure activities.    PERFORMANCE DEFICITS: in functional skills including ADLs, IADLs, coordination, dexterity, strength, muscle spasms, Fine motor control, Gross  motor control, continence, skin integrity, and UE functional use,   IMPAIRMENTS: are limiting patient from ADLs, IADLs, work, and leisure.   CO-MORBIDITIES: has co-morbidities such as incontinence and wound that affects occupational performance. Patient will benefit from skilled OT to address above impairments and improve overall function.  REHAB POTENTIAL: Fair due to chronicity of injury  PLAN:  OT FREQUENCY: 1-2x/week   OT DURATION: Additional 8 weeks  PLANNED INTERVENTIONS: self care/ADL training, therapeutic exercise, therapeutic activity, neuromuscular re-education, manual therapy, passive range of motion, balance training, functional mobility training, splinting, patient/family education, energy conservation, coping strategies training, and DME and/or AE instructions  RECOMMENDED OTHER SERVICES: Patient was seen for PT  evaluation today with treatment plans coordinated for 2x/week.  CONSULTED AND AGREED WITH PLAN OF CARE: Patient and family member/caregiver  PLAN FOR NEXT SESSION:   NMES review second unit reciprocal option PRN  Review computer adaptations PRN   Check splints/Resting hand splint adjustments PRN Review/progress HEPs Explore FM tasks and establish weight shifting instruction for pressure relief.  Cutting, drink opening, hair   [name] LITTIE Pride, OT 12/07/2023, 11:25 AM

## 2023-12-08 ENCOUNTER — Encounter (HOSPITAL_BASED_OUTPATIENT_CLINIC_OR_DEPARTMENT_OTHER): Admitting: Internal Medicine

## 2023-12-08 DIAGNOSIS — S30810A Abrasion of lower back and pelvis, initial encounter: Secondary | ICD-10-CM

## 2023-12-08 DIAGNOSIS — T798XXA Other early complications of trauma, initial encounter: Secondary | ICD-10-CM | POA: Diagnosis not present

## 2023-12-08 DIAGNOSIS — S31829A Unspecified open wound of left buttock, initial encounter: Secondary | ICD-10-CM

## 2023-12-09 ENCOUNTER — Ambulatory Visit: Admitting: Occupational Therapy

## 2023-12-09 ENCOUNTER — Ambulatory Visit: Admitting: Physical Therapy

## 2023-12-09 DIAGNOSIS — G8254 Quadriplegia, C5-C7 incomplete: Secondary | ICD-10-CM

## 2023-12-09 DIAGNOSIS — R278 Other lack of coordination: Secondary | ICD-10-CM | POA: Diagnosis not present

## 2023-12-09 DIAGNOSIS — R293 Abnormal posture: Secondary | ICD-10-CM

## 2023-12-09 DIAGNOSIS — M6281 Muscle weakness (generalized): Secondary | ICD-10-CM

## 2023-12-09 DIAGNOSIS — M25642 Stiffness of left hand, not elsewhere classified: Secondary | ICD-10-CM

## 2023-12-09 DIAGNOSIS — R208 Other disturbances of skin sensation: Secondary | ICD-10-CM

## 2023-12-09 DIAGNOSIS — M25641 Stiffness of right hand, not elsewhere classified: Secondary | ICD-10-CM | POA: Diagnosis not present

## 2023-12-09 DIAGNOSIS — R2689 Other abnormalities of gait and mobility: Secondary | ICD-10-CM | POA: Diagnosis not present

## 2023-12-09 DIAGNOSIS — R29898 Other symptoms and signs involving the musculoskeletal system: Secondary | ICD-10-CM

## 2023-12-09 DIAGNOSIS — M24542 Contracture, left hand: Secondary | ICD-10-CM

## 2023-12-09 DIAGNOSIS — M24541 Contracture, right hand: Secondary | ICD-10-CM

## 2023-12-09 DIAGNOSIS — R29818 Other symptoms and signs involving the nervous system: Secondary | ICD-10-CM | POA: Diagnosis not present
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
## 2023-12-09 NOTE — Therapy (Signed)
 OUTPATIENT PHYSICAL THERAPY NEURO TREATMENT   Patient Name: [name] MRN: [id number] DOB:12/27/1950, 73 y.o., female Today's Date: 12/09/2023   PCP: [name], MD REFERRING PROVIDER: [name], MD   END OF SESSION:    PT End of Session - 12/09/23 1146     Visit Number 25    Number of Visits 29   recert   Date for PT Re-Evaluation 01/01/24   to allow for scheduling delays   Authorization Type [employer] MEDICARE    PT Start Time 1145    PT Stop Time 1230    PT Time Calculation (min) 45 min    Equipment Utilized During Treatment Gait belt    Activity Tolerance Patient tolerated treatment well    Behavior During Therapy [facility] for tasks assessed/performed                         Past Medical History:  Diagnosis Date   CERVICAL POLYP 03/11/2008   Qualifier: Diagnosis of  By: Charlett MD, Apolinar POUR    Colon polyps 2005   on colonscopy Dr. Aneita   Fibroid 2004   Per Dr. Lenon   History of shingles    face and mouth   Hx of skin cancer, basal cell    Rosacea    Sciatica of left side 09/28/2013   Scoliosis    noted on mri done for back pain   Past Surgical History:  Procedure Laterality Date   BUNIONECTOMY     Patient Active Problem List   Diagnosis Date Noted   Buttock wound, left, subsequent encounter 03/16/2023   Bronchiectasis with acute exacerbation (HCC) 03/15/2023   Buttock wound, left, initial encounter 11/02/2022   Orthostatic hypotension 08/13/2022   Neurogenic bowel 05/03/2022   Spasticity 05/03/2022   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/03/2022   Nerve pain 05/03/2022   Medication monitoring encounter 01/08/2022   Neurogenic bladder 10/11/2021   Urinary incontinence 10/11/2021   ESBL (extended spectrum beta-lactamase) producing bacteria infection 10/09/2021   Recurrent UTI 10/09/2021   Quadriplegia, C5-C7 incomplete (HCC) 01/16/2021   History of spinal fracture 01/16/2021   Suprapubic catheter (HCC) 01/16/2021   Encounter for  routine gynecological examination 09/28/2013   Onychomycosis 09/28/2013   Foot deformity, acquired 03/26/2012   Encounter for preventive health examination 12/25/2010   ROSACEA 08/25/2009   Disturbance in sleep behavior 03/11/2008   SKIN CANCER, HX OF 03/11/2008   DYSURIA, HX OF 03/11/2008   Hyperlipidemia 02/10/2007   CERVICALGIA 02/10/2007    ONSET DATE: 08/29/2023 (referral date)  REFERRING DIAG: G82.54 (ICD-10-CM) - Incomplete quadriplegia at C5-C8 level (HCC)  THERAPY DIAG:  Muscle weakness (generalized)  Other symptoms and signs involving the nervous system  Other symptoms and signs involving the musculoskeletal system  Abnormal posture  Quadriplegia, C5-C7 incomplete (HCC)  Rationale for Evaluation and Treatment: Rehabilitation  SUBJECTIVE:  SUBJECTIVE STATEMENT:  Pt denies any acute changes since last visit. She is interested in scheduling more therapy visits leading up to her trip to [place] end of July/early August and potentially continuing therapy once she gets back as well as she continues to feel that her arms are much weaker and feels there are things she can work on therapy that she cannot do at home. Pt also going to reach out to Dr. Lovorn for a referral to speech therapy to work on breathing and swallowing.  From initial eval: Pt familiar to this clinic, last seen Oct-Nov 2024 but has been seen for multiple POCs since her initial injury in 2022. Pt returns to this clinic after being in [place]  for the past few months. While in [place]  in December 2024 patient had a fall where she slid forwards out of her wheelchair onto the floor, ended up fracturing both of her femurs. Pt was hospitalized for 10-11 days and had surgical repair of her femurs. Pt reports she has been cleared of  all restrictions since surgery, does have ongoing swelling in both legs and worsened spasticity. Pt reports that the spasticity has slightly improved since it initially started, was told by Dr. Lovorn the swelling may not resolve for 6-8 months (it has been 3 months) and not sure if spasticity will resolve but patient is hopeful that if her swelling improves her spasticity will improve as well.  Pt asking about other options to help manage the swelling her legs, has tried variable compression stockings (knee high) and her PTs in [place]  recommended tight shapewear. Pt reports that the knee-high compression stockings led to increased swelling in her knees and the shapewear did not help her swelling. Encouraged patient to get thigh-high compression stockings and elevate her LE in PWC. Pt has ordered an articulating bed that will get here next Monday (3/31) so she can better elevate her legs when in bed.  Pt is also concerned about decreased ROM in her legs, [name] but she feels she has more motion in her LLE as compared to RLE and may have mild foot drop on her R side. Pt does have PRAFOs to wear at night. Pt has worked up to standing in her standing frame x 30-40 min at a time. Pt also worked on standing with her PT and in // bars in [place] . Pt also reports with her injuries she lost the ability to lock/unlock her R knee but that it is getting better. She reports she lost a lot of stamina during her hospital stay as well.  Pt also has a new wound since last seen in this clinic in her L gluteal fold, shearing injury. Pt was seeing wound care in [place]  and is scheduled to see wound care with [facility] early April (was not able to schedule with [facility] wound care until late April).  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 Yes: NPRS scale:  3-4 Pain location: elbows to fingertips on both arms Pain description: nerve pain Aggravating factors: not stated Relieving factors: not stated  PRECAUTIONS: Fall and Other: osteoporosis  RED FLAGS: None   WEIGHT BEARING RESTRICTIONS: No  FALLS: Has patient fallen in last 6 months? Yes. Number of falls 1 fall in [place]  that resulted in B femur fractures  LIVING ENVIRONMENT: Lives with: lives with their spouse  and and with full-time caregiver [name] Lives in: House/apartment Home is power wheelchair accessible Has following equipment at home: Wheelchair (power), Wheelchair (manual), Grab bars, Ramped entry, and standing frame, slide board  PLOF: Independent with household mobility with device, Independent with community mobility with device, Requires assistive device for independence, Needs assistance with ADLs, and Needs assistance with transfers  PATIENT GOALS: still working on stand and pivot with goal to pivot to commode or to a chair work on core-will help me with standing improve my stamina - being in the hospital I lost strength/endurance   OBJECTIVE:  Note: Objective measures were completed at Evaluation unless otherwise noted.  DIAGNOSTIC FINDINGS: None update/relevant to this POC  COGNITION: Overall cognitive status: Within functional limits for tasks assessed   SENSATION: Decreased sensation in BUE and BLE secondary to incomplete quadriplegia Decreased sensation in proximal LLE as compared to distal LE  EDEMA:  Circumferential: R knee: 17; L knee: 17.5 and Figure 8: R ankle 21, L ankle 21.5  MUSCLE TONE: increased spasticity in BLE   POSTURE: rounded shoulders and forward head  LOWER EXTREMITY ROM:     Passive  Right Eval Left Eval  Hip flexion Tight hip flexors Tight hip flexors  Hip extension    Hip abduction    Hip adduction    Hip internal rotation    Hip external rotation    Knee flexion Tight HS Tight HS  Knee extension    Ankle  dorsiflexion Decreased, tight gastroc Decreased, tight gastroc  Ankle plantarflexion    Ankle inversion    Ankle eversion     (Blank rows = not tested)  LOWER EXTREMITY MMT:    MMT Right Eval Left Eval  Hip flexion 1 2-  Hip extension    Hip abduction    Hip adduction    Hip internal rotation    Hip external rotation    Knee flexion 0 3  Knee extension 2- 2-  Ankle dorsiflexion 2- 3  Ankle plantarflexion    Ankle inversion    Ankle eversion    (Blank rows = not tested)  BED MOBILITY:  From previous POC: Sit to supine Mod A Supine to sit Mod A Rolling to Right Mod A Rolling to Left Mod A Undulating mattress for wound management on standard bed (elevated-so often doing uphill sliding board transfers); she would like to continue working on sitting up independently, she has been working on rolling, needs less assistance w/ this when someone props her leg into hooklying; would like something to help her pull her left leg to her butt for stretching as well as bed mobility.  TRANSFERS: From previous POC: Pt continues using combination of bump over, slide board, and depression (squat pivot) transfers.                                                                                                                              TREATMENT:   NMR Use  of standing frame for weight-bearing through BLE to improve bone health, spasticity management, improve circulation, improve skin health and decrease risk for pressure injuries, improve bowel and bladder function, and decrease risk for contractures: Transition from seated in PWC to standing in standing frame, dependent for placement of standing frame sling. Sit to stand in standing frame dependently. Pt tolerates standing x 29 min total in standing frame this session, no signs/symptoms of OH and BP not assessed and pt will typically stand at home several times per week for up to 45 min. While in standing decreased support from sling so  patient in perched position: Standing mini-squats 2 x 10 reps Focus on B glute and quad activation. Pt finds it easier to activate her L quads first then her R quads. When she tries to activate R and L quad simultaneously it takes more brain power/concentration to get R quads to turn on. Decreased activation of R side muscles with onset of fatigue While in semi-perched position provided manual assist to bring patient into upright position, she is then able to maintain this position 2 x 2 min with B glute and quad activation before resting back into sling Pt brought back into sitting in her PWC at end of session. [name] able to help her reposition in PWC.   PATIENT EDUCATION: Education details: Continue stretching program and UB strengthening exercises Person educated: Patient and Arts administrator Education method: Explanation, Demonstration, Tactile cues, and Verbal cues Education comprehension: verbalized understanding and returned demonstration  HOME EXERCISE PROGRAM: Access Code: [email or web address] URL: [email or web address] Date: 11/22/2023 Prepared by: Waddell Southgate  Exercises - Seated Elbow Extension with Self-Anchored Resistance  - 1 x daily - 7 x weekly - 3 sets - 10 reps - Supine Elbow Flexion Extension with Dumbbell  - 1 x daily - 7 x weekly - 3 sets - 10 reps - Seated Single Arm Elbow Extension Push-up on Table  - 1 x daily - 7 x weekly - 3 sets - 10 reps - Wheelchair Push-Up (AKA)  - 1 x daily - 7 x weekly - 3 sets - 5 reps - Seated Biceps Curl  - 1 x daily - 7 x weekly - 3 sets - 10 reps - Forearm Pronation and Supination with Hammer  - 1 x daily - 7 x weekly - 3 sets - 10 reps - Seated Wrist Extension with Dumbbell  - 1 x daily - 7 x weekly - 3 sets - 10 reps  GOALS: Goals reviewed with patient? Yes  SHORT TERM GOALS: Target date: 10/07/2023  HEP to be established for stretching and strengthening as appropriate. Baseline: not established at initial eval, reviewed  during PT POC - pt and [name] report it is going well at home (4/25) Goal status: MET  2.  Pt will perform sit to stand transfer with LRAD with mod A Baseline: max A to stedy (4/4), max A in // bars (4/25) Goal status: IN PROGRESS  3.  Pt to tolerate standing x 5 min with LRAD to demonstrate improved endurance Baseline: 30 sec in stedy (initial), 2:35 min in // bars (4/25) Goal status: IN PROGRESS  4.  Pt to demonstrate reduced edema in BLE with a reduction in circumference/figure 8 measurement by 0.5 from initial evaluation. Baseline: Circumferential: R knee: 17; L knee: 17.5 and Figure 8: R ankle 21, L ankle 21.5 4/25: R knee 16, L knee: 16 11/16, R ankle: 21, L ankle 20 Goal status: IN PROGRESS   LONG TERM GOALS: Target  date: 11/04/2023  Patient and her caregiver to be independent with performance of HEP for stretching and strengthening as appropriate. Baseline: not established at initial eval, reviewed during PT POC - pt and [name] report it is going well at home (4/25) Goal status: MET  2.  Pt will perform sit to stand transfer with LRAD with min A Baseline: max A to stedy (4/4), max A in // bars (4/25), max A in // bars (5/22) Goal status: NOT MET  3.  Pt to tolerate standing x 5 min with LRAD to demonstrate improved endurance Baseline: 30 sec in stedy (initial), 2:35 min in // bars (4/25), 3 min in // bars (5/22) Goal status: NOT MET  4.  Pt to demonstrate reduced edema in BLE with a reduction in circumference/figure 8 measurement by 1 from initial evaluation. Baseline: Circumferential:  R knee: 17; L knee: 17.5 and Figure 8: R ankle 21, L ankle 21.5 4/25: R knee 16, L knee: 16 11/16, R ankle: 21, L ankle 20 5/30: R knee: 16, L knee: 16, R ankle: 20.5, L ankle: 20 Goal status: PARTIALLY MET   NEW SHORT TERM GOALS:   Target date: 12/04/2023  Pt will perform sit to stand transfer with LRAD with mod A Baseline: max A to stedy (4/4), max A in // bars  (4/25), max A in // bars (5/22), max A to +2 in // bars (6/18) Goal status: NOT MET  2.  Pt to tolerate standing x 4 min with LRAD to demonstrate improved endurance Baseline: 30 sec in stedy (initial), 2:35 min in // bars (4/25), 3 min in // bars (5/22), 4 min in // bars (6/18) Goal status: MET  3.  Pt will progress to performing bump transfers with mod A at the most for increased independence with functional mobility Baseline: up to max A (5/30), min A (6/18) Goal status: MET   NEW LONG TERM GOALS:  Target date: 12/26/2023  Pt will perform sit to stand transfer with LRAD with min A Baseline: max A to stedy (4/4), max A in // bars (4/25), max A in // bars (5/22), max A to +2 in // bars (6/18) Goal status: REVISED  2.  Pt to tolerate standing x 5 min with LRAD to demonstrate improved endurance Baseline: 30 sec in stedy (initial), 2:35 min in // bars (4/25), 3 min in // bars (5/22), 4 min in // bars (6/18) Goal status: REVISED  3.  Pt will progress to performing bump transfers with min A at the most for increased independence with functional mobility Baseline: up to max A (5/30), min A (6/18) Goal status: MET  4.  Pt will be able to maintain dynamic sitting balance in long-sit position x 5 min while performing a functional task with no more than mod A Baseline: max A (5/30) Goal status: INITIAL    ASSESSMENT:  CLINICAL IMPRESSION: Emphasis of skilled PT session of working B glute and quad strengthening in standing position with use of standing frame. Pt with good tolerance for standing this date with good activation of musculature noted. She does need increased time to activate muscles on her R side as compared to her L side and her R side fatigues more quickly. She continues to benefit from skilled PT services to work towards increased safety and independence with functional mobility. Continue POC.      OBJECTIVE IMPAIRMENTS: decreased balance, decreased endurance, decreased  mobility, difficulty walking, decreased ROM, decreased strength, increased edema, impaired perceived functional ability,  increased muscle spasms, impaired flexibility, impaired sensation, impaired tone, impaired UE functional use, postural dysfunction, and pain.   ACTIVITY LIMITATIONS: carrying, lifting, bending, standing, stairs, transfers, bed mobility, continence, bathing, toileting, dressing, reach over head, and hygiene/grooming  PARTICIPATION LIMITATIONS: meal prep, cleaning, laundry, driving, shopping, community activity, and occupation  PERSONAL FACTORS: Age, Sex, Time since onset of injury/illness/exacerbation, and 1-2 comorbidities:   C7 ASIA C- incomplete quad w/ neurogenic bladder and bowel, HLD, Hx of skin cancerare also affecting patient's functional outcome.   REHAB POTENTIAL: Good  CLINICAL DECISION MAKING: Stable/uncomplicated  EVALUATION COMPLEXITY: High  PLAN:  PT FREQUENCY: 2x/week  PT DURATION: 8 weeks  PLANNED INTERVENTIONS: [id number]- PT Re-evaluation, 97110-Therapeutic exercises, 97530- Therapeutic activity, 97112- Neuromuscular re-education, 97535- Self Care, [id number]- Manual therapy, [phone]- Gait training, [phone]- Orthotic Fit/training, [phone]- Electrical stimulation (manual), Patient/Family education, Balance training, Stair training, Taping, Dry Needling, Joint mobilization, Scar mobilization, Compression bandaging, DME instructions, Wheelchair mobility training, Cryotherapy, and Moist heat  PLAN FOR NEXT SESSION: sit to stands, standing tolerance with stedy, in // bars, possibly with RW, core strengthening/stability, endurance; wants to have conversation about taking a break (3-6 months) after this POC near end of this POC, standing lateral weight shifts, mini-squats in standing?, Yoga block press-ups vs push-up blocks, long-sitting - unsupported for core work?, bump transfers with and without slide board - pt wants to work on this skill!, lateral leans, work on mini squats  in standing frame, prone UB strengthening  Asking about scheduling more visits before end of July and then when she gets back from [place] - talk to treatment team    Icholas Irby, PT Waddell Southgate, PT, DPT, CSRS  12/09/2023, 12:34 PM

## 2023-12-12 ENCOUNTER — Encounter: Payer: Self-pay | Admitting: Physical Medicine and Rehabilitation

## 2023-12-12 ENCOUNTER — Ambulatory Visit: Admitting: Physical Therapy

## 2023-12-12 ENCOUNTER — Ambulatory Visit: Admitting: Occupational Therapy

## 2023-12-12 DIAGNOSIS — R278 Other lack of coordination: Secondary | ICD-10-CM

## 2023-12-12 DIAGNOSIS — M25641 Stiffness of right hand, not elsewhere classified: Secondary | ICD-10-CM

## 2023-12-12 DIAGNOSIS — R208 Other disturbances of skin sensation: Secondary | ICD-10-CM | POA: Diagnosis not present

## 2023-12-12 DIAGNOSIS — M25642 Stiffness of left hand, not elsewhere classified: Secondary | ICD-10-CM | POA: Diagnosis not present

## 2023-12-12 DIAGNOSIS — M6281 Muscle weakness (generalized): Secondary | ICD-10-CM

## 2023-12-12 DIAGNOSIS — G8254 Quadriplegia, C5-C7 incomplete: Secondary | ICD-10-CM

## 2023-12-12 DIAGNOSIS — R29818 Other symptoms and signs involving the nervous system: Secondary | ICD-10-CM | POA: Diagnosis not present

## 2023-12-12 DIAGNOSIS — R293 Abnormal posture: Secondary | ICD-10-CM | POA: Diagnosis not present

## 2023-12-12 DIAGNOSIS — R2689 Other abnormalities of gait and mobility: Secondary | ICD-10-CM | POA: Diagnosis not present

## 2023-12-12 DIAGNOSIS — R4789 Other speech disturbances: Secondary | ICD-10-CM

## 2023-12-12 DIAGNOSIS — R1310 Dysphagia, unspecified: Secondary | ICD-10-CM

## 2023-12-12 DIAGNOSIS — G8250 Quadriplegia, unspecified: Secondary | ICD-10-CM

## 2023-12-12 DIAGNOSIS — R29898 Other symptoms and signs involving the musculoskeletal system: Secondary | ICD-10-CM | POA: Diagnosis not present

## 2023-12-12 NOTE — Therapy (Signed)
 OUTPATIENT PHYSICAL THERAPY NEURO TREATMENT   Patient Name: [name] MRN: [id number] DOB:12/04/1950, 73 y.o., female Today's Date: 12/12/2023   PCP: [name], MD REFERRING PROVIDER: [name], MD   END OF SESSION:    PT End of Session - 12/12/23 1017     Visit Number 26    Number of Visits 29   recert   Date for PT Re-Evaluation 01/01/24   to allow for scheduling delays   Authorization Type [employer] MEDICARE    PT Start Time 1015    PT Stop Time 1100    PT Time Calculation (min) 45 min    Equipment Utilized During Treatment Gait belt    Activity Tolerance Patient tolerated treatment well    Behavior During Therapy [facility] for tasks assessed/performed                          Past Medical History:  Diagnosis Date   CERVICAL POLYP 03/11/2008   Qualifier: Diagnosis of  By: Charlett MD, Apolinar POUR    Colon polyps 2005   on colonscopy Dr. Aneita   Fibroid 2004   Per Dr. Lenon   History of shingles    face and mouth   Hx of skin cancer, basal cell    Rosacea    Sciatica of left side 09/28/2013   Scoliosis    noted on mri done for back pain   Past Surgical History:  Procedure Laterality Date   BUNIONECTOMY     Patient Active Problem List   Diagnosis Date Noted   Buttock wound, left, subsequent encounter 03/16/2023   Bronchiectasis with acute exacerbation (HCC) 03/15/2023   Buttock wound, left, initial encounter 11/02/2022   Orthostatic hypotension 08/13/2022   Neurogenic bowel 05/03/2022   Spasticity 05/03/2022   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/03/2022   Nerve pain 05/03/2022   Medication monitoring encounter 01/08/2022   Neurogenic bladder 10/11/2021   Urinary incontinence 10/11/2021   ESBL (extended spectrum beta-lactamase) producing bacteria infection 10/09/2021   Recurrent UTI 10/09/2021   Quadriplegia, C5-C7 incomplete (HCC) 01/16/2021   History of spinal fracture 01/16/2021   Suprapubic catheter (HCC) 01/16/2021   Encounter for  routine gynecological examination 09/28/2013   Onychomycosis 09/28/2013   Foot deformity, acquired 03/26/2012   Encounter for preventive health examination 12/25/2010   ROSACEA 08/25/2009   Disturbance in sleep behavior 03/11/2008   SKIN CANCER, HX OF 03/11/2008   DYSURIA, HX OF 03/11/2008   Hyperlipidemia 02/10/2007   CERVICALGIA 02/10/2007    ONSET DATE: 08/29/2023 (referral date)  REFERRING DIAG: G82.54 (ICD-10-CM) - Incomplete quadriplegia at C5-C8 level (HCC)  THERAPY DIAG:  Muscle weakness (generalized)  Other symptoms and signs involving the nervous system  Other symptoms and signs involving the musculoskeletal system  Abnormal posture  Quadriplegia, C5-C7 incomplete (HCC)  Other lack of coordination  Rationale for Evaluation and Treatment: Rehabilitation  SUBJECTIVE:  SUBJECTIVE STATEMENT:  Pt denies any acute changes since last visit. Pt gets her new cushion from [employer] tomorrow. Pt had spasms in toes of her R foot this morning.  From initial eval: Pt familiar to this clinic, last seen Oct-Nov 2024 but has been seen for multiple POCs since her initial injury in 2022. Pt returns to this clinic after being in [place]  for the past few months. While in [place]  in December 2024 patient had a fall where she slid forwards out of her wheelchair onto the floor, ended up fracturing both of her femurs. Pt was hospitalized for 10-11 days and had surgical repair of her femurs. Pt reports she has been cleared of all restrictions since surgery, does have ongoing swelling in both legs and worsened spasticity. Pt reports that the spasticity has slightly improved since it initially started, was told by Dr. Lovorn the swelling may not resolve for 6-8 months (it has been 3 months) and not sure if  spasticity will resolve but patient is hopeful that if her swelling improves her spasticity will improve as well.  Pt asking about other options to help manage the swelling her legs, has tried variable compression stockings (knee high) and her PTs in [place]  recommended tight shapewear. Pt reports that the knee-high compression stockings led to increased swelling in her knees and the shapewear did not help her swelling. Encouraged patient to get thigh-high compression stockings and elevate her LE in PWC. Pt has ordered an articulating bed that will get here next Monday (3/31) so she can better elevate her legs when in bed.  Pt is also concerned about decreased ROM in her legs, [name] but she feels she has more motion in her LLE as compared to RLE and may have mild foot drop on her R side. Pt does have PRAFOs to wear at night. Pt has worked up to standing in her standing frame x 30-40 min at a time. Pt also worked on standing with her PT and in // bars in [place] . Pt also reports with her injuries she lost the ability to lock/unlock her R knee but that it is getting better. She reports she lost a lot of stamina during her hospital stay as well.  Pt also has a new wound since last seen in this clinic in her L gluteal fold, shearing injury. Pt was seeing wound care in [place]  and is scheduled to see wound care with [facility] early April (was not able to schedule with [facility] wound care until late April).  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 Yes: NPRS scale: 3-4 Pain location: elbows to fingertips on both arms Pain description: nerve pain Aggravating factors: not stated Relieving factors: not stated  PRECAUTIONS: Fall and Other: osteoporosis  RED FLAGS: None   WEIGHT BEARING RESTRICTIONS: No  FALLS: Has patient fallen in last 6  months? Yes. Number of falls 1 fall in [place]  that resulted in B femur fractures  LIVING ENVIRONMENT: Lives with: lives with their spouse and and with full-time caregiver [name] Lives in: House/apartment Home is power wheelchair accessible Has following equipment at home: Wheelchair (power), Wheelchair (manual), Grab bars, Ramped entry, and standing frame, slide board  PLOF: Independent with household mobility with device, Independent with community mobility with device, Requires assistive device for independence, Needs assistance with ADLs, and Needs assistance  with transfers  PATIENT GOALS: still working on stand and pivot with goal to pivot to commode or to a chair work on core-will help me with standing improve my stamina - being in the hospital I lost strength/endurance   OBJECTIVE:  Note: Objective measures were completed at Evaluation unless otherwise noted.  DIAGNOSTIC FINDINGS: None update/relevant to this POC  COGNITION: Overall cognitive status: Within functional limits for tasks assessed   SENSATION: Decreased sensation in BUE and BLE secondary to incomplete quadriplegia Decreased sensation in proximal LLE as compared to distal LE  EDEMA:  Circumferential: R knee: 17; L knee: 17.5 and Figure 8: R ankle 21, L ankle 21.5  MUSCLE TONE: increased spasticity in BLE   POSTURE: rounded shoulders and forward head  LOWER EXTREMITY ROM:     Passive  Right Eval Left Eval  Hip flexion Tight hip flexors Tight hip flexors  Hip extension    Hip abduction    Hip adduction    Hip internal rotation    Hip external rotation    Knee flexion Tight HS Tight HS  Knee extension    Ankle dorsiflexion Decreased, tight gastroc Decreased, tight gastroc  Ankle plantarflexion    Ankle inversion    Ankle eversion     (Blank rows = not tested)  LOWER EXTREMITY MMT:    MMT Right Eval Left Eval  Hip flexion 1 2-  Hip extension    Hip abduction    Hip adduction    Hip  internal rotation    Hip external rotation    Knee flexion 0 3  Knee extension 2- 2-  Ankle dorsiflexion 2- 3  Ankle plantarflexion    Ankle inversion    Ankle eversion    (Blank rows = not tested)  BED MOBILITY:  From previous POC: Sit to supine Mod A Supine to sit Mod A Rolling to Right Mod A Rolling to Left Mod A Undulating mattress for wound management on standard bed (elevated-so often doing uphill sliding board transfers); she would like to continue working on sitting up independently, she has been working on rolling, needs less assistance w/ this when someone props her leg into hooklying; would like something to help her pull her left leg to her butt for stretching as well as bed mobility.  TRANSFERS: From previous POC: Pt continues using combination of bump over, slide board, and depression (squat pivot) transfers.                                                                                                                              TREATMENT:   Self-Care/Home Management Discussed patient's personal therapy goals to justify addition of therapy visits. Pt states the following as her goals: She wants to keep working towards stand pivot transfers until therapists tell her this is not realistic She wants to continue to work on improving her arm strength To increase her independence with sit to stands To be able to  better move her body when she needs to move it She wants to work on her core strength and sitting balance Interested to see how well she balances on her new cushion  NMR Pt received seated in her PWC. Bump transfer across slide board PWC to mat table with min A for some LE management. Sit to supine mod A for BLE management. Supine to R sidelying to prone with max A for LE management and trunk repositioning. Once in prone worked on the following exercises: Prone scap-squeezes x 10 reps in press-up position Fully prone on mat with BUE OH to prone press-up  position x 10 reps Assist needed for RUE management Pt with onset of pain in her R biceps due to exertion of UE Skin inspection performed following this due to RUE shearing on mat table, pt found to have redness on her elbow but it is blanchable Prone press-up hip flexor stretch hold 3 x 30-45 sec each Rolling L/R on mat table with min A needed at hips to fully complete roll V/c to increase ROM she is swinging her UE in (ie roll her trunk more each direction) with improved ability to bring herself into sidelying Pt does still need assist to get LE into hooklying position and to bring her hips over to fully get into sidelying She can benefit from further practice of rolling R/L Supine to sit with max A for BLE management and trunk elevation Bump transfer across slide board to [facility] with min A for some LE management Pt left seated in PWC in care of [place] for repositioning in chair.   PATIENT EDUCATION: Education details: Continue stretching program and UB strengthening exercises, will add PT/OT visits Person educated: Patient and Caregiver [name] Education method: Explanation, Demonstration, Tactile cues, and Verbal cues Education comprehension: verbalized understanding and returned demonstration  HOME EXERCISE PROGRAM: Access Code: [email or web address] URL: [email or web address] Date: 11/22/2023 Prepared by: Waddell Southgate  Exercises - Seated Elbow Extension with Self-Anchored Resistance  - 1 x daily - 7 x weekly - 3 sets - 10 reps - Supine Elbow Flexion Extension with Dumbbell  - 1 x daily - 7 x weekly - 3 sets - 10 reps - Seated Single Arm Elbow Extension Push-up on Table  - 1 x daily - 7 x weekly - 3 sets - 10 reps - Wheelchair Push-Up (AKA)  - 1 x daily - 7 x weekly - 3 sets - 5 reps - Seated Biceps Curl  - 1 x daily - 7 x weekly - 3 sets - 10 reps - Forearm Pronation and Supination with Hammer  - 1 x daily - 7 x weekly - 3 sets - 10 reps - Seated Wrist Extension with Dumbbell  -  1 x daily - 7 x weekly - 3 sets - 10 reps  GOALS: Goals reviewed with patient? Yes  SHORT TERM GOALS: Target date: 10/07/2023  HEP to be established for stretching and strengthening as appropriate. Baseline: not established at initial eval, reviewed during PT POC - pt and [name] report it is going well at home (4/25) Goal status: MET  2.  Pt will perform sit to stand transfer with LRAD with mod A Baseline: max A to stedy (4/4), max A in // bars (4/25) Goal status: IN PROGRESS  3.  Pt to tolerate standing x 5 min with LRAD to demonstrate improved endurance Baseline: 30 sec in stedy (initial), 2:35 min in // bars (4/25) Goal status: IN PROGRESS  4.  Pt to demonstrate reduced  edema in BLE with a reduction in circumference/figure 8 measurement by 0.5 from initial evaluation. Baseline: Circumferential: R knee: 17; L knee: 17.5 and Figure 8: R ankle 21, L ankle 21.5 4/25: R knee 16, L knee: 16 11/16, R ankle: 21, L ankle 20 Goal status: IN PROGRESS   LONG TERM GOALS: Target date: 11/04/2023  Patient and her caregiver to be independent with performance of HEP for stretching and strengthening as appropriate. Baseline: not established at initial eval, reviewed during PT POC - pt and [name] report it is going well at home (4/25) Goal status: MET  2.  Pt will perform sit to stand transfer with LRAD with min A Baseline: max A to stedy (4/4), max A in // bars (4/25), max A in // bars (5/22) Goal status: NOT MET  3.  Pt to tolerate standing x 5 min with LRAD to demonstrate improved endurance Baseline: 30 sec in stedy (initial), 2:35 min in // bars (4/25), 3 min in // bars (5/22) Goal status: NOT MET  4.  Pt to demonstrate reduced edema in BLE with a reduction in circumference/figure 8 measurement by 1 from initial evaluation. Baseline: Circumferential:  R knee: 17; L knee: 17.5 and Figure 8: R ankle 21, L ankle 21.5 4/25: R knee 16, L knee: 16 11/16, R ankle: 21, L ankle  20 5/30: R knee: 16, L knee: 16, R ankle: 20.5, L ankle: 20 Goal status: PARTIALLY MET   NEW SHORT TERM GOALS:   Target date: 12/04/2023  Pt will perform sit to stand transfer with LRAD with mod A Baseline: max A to stedy (4/4), max A in // bars (4/25), max A in // bars (5/22), max A to +2 in // bars (6/18) Goal status: NOT MET  2.  Pt to tolerate standing x 4 min with LRAD to demonstrate improved endurance Baseline: 30 sec in stedy (initial), 2:35 min in // bars (4/25), 3 min in // bars (5/22), 4 min in // bars (6/18) Goal status: MET  3.  Pt will progress to performing bump transfers with mod A at the most for increased independence with functional mobility Baseline: up to max A (5/30), min A (6/18) Goal status: MET   NEW LONG TERM GOALS:  Target date: 12/26/2023  Pt will perform sit to stand transfer with LRAD with min A Baseline: max A to stedy (4/4), max A in // bars (4/25), max A in // bars (5/22), max A to +2 in // bars (6/18) Goal status: REVISED  2.  Pt to tolerate standing x 5 min with LRAD to demonstrate improved endurance Baseline: 30 sec in stedy (initial), 2:35 min in // bars (4/25), 3 min in // bars (5/22), 4 min in // bars (6/18) Goal status: REVISED  3.  Pt will progress to performing bump transfers with min A at the most for increased independence with functional mobility Baseline: up to max A (5/30), min A (6/18) Goal status: MET  4.  Pt will be able to maintain dynamic sitting balance in long-sit position x 5 min while performing a functional task with no more than mod A Baseline: max A (5/30) Goal status: INITIAL    ASSESSMENT:  CLINICAL IMPRESSION: Emphasis of skilled PT session on continuing to work on bump transfers and working on prone positioning and bed mobility. Pt with increased difficulty maneuvering her RUE in prone position this date with assist needed to bring arm back and under her body in prone. Pt also with increased  difficulty rolling  L/R from supine on mat table as compared to when this was previously assessed in therapy. She continues to benefit from skilled PT services to work towards increased safety and independence with functional mobility. Continue POC.      OBJECTIVE IMPAIRMENTS: decreased balance, decreased endurance, decreased mobility, difficulty walking, decreased ROM, decreased strength, increased edema, impaired perceived functional ability, increased muscle spasms, impaired flexibility, impaired sensation, impaired tone, impaired UE functional use, postural dysfunction, and pain.   ACTIVITY LIMITATIONS: carrying, lifting, bending, standing, stairs, transfers, bed mobility, continence, bathing, toileting, dressing, reach over head, and hygiene/grooming  PARTICIPATION LIMITATIONS: meal prep, cleaning, laundry, driving, shopping, community activity, and occupation  PERSONAL FACTORS: Age, Sex, Time since onset of injury/illness/exacerbation, and 1-2 comorbidities:   C7 ASIA C- incomplete quad w/ neurogenic bladder and bowel, HLD, Hx of skin cancerare also affecting patient's functional outcome.   REHAB POTENTIAL: Good  CLINICAL DECISION MAKING: Stable/uncomplicated  EVALUATION COMPLEXITY: High  PLAN:  PT FREQUENCY: 2x/week  PT DURATION: 8 weeks  PLANNED INTERVENTIONS: [id number]- PT Re-evaluation, 97110-Therapeutic exercises, 97530- Therapeutic activity, 97112- Neuromuscular re-education, 97535- Self Care, [id number]- Manual therapy, [phone]- Gait training, [phone]- Orthotic Fit/training, [phone]- Electrical stimulation (manual), Patient/Family education, Balance training, Stair training, Taping, Dry Needling, Joint mobilization, Scar mobilization, Compression bandaging, DME instructions, Wheelchair mobility training, Cryotherapy, and Moist heat  PLAN FOR NEXT SESSION: sit to stands, standing tolerance with stedy, in // bars, possibly with RW, core strengthening/stability, endurance; wants to have conversation about taking  a break (3-6 months) after this POC near end of this POC, standing lateral weight shifts, mini-squats in standing?, Yoga block press-ups vs push-up blocks, long-sitting - unsupported for core work?, bump transfers with and without slide board - pt wants to work on this skill!, lateral leans, work on mini squats in standing frame, prone UB strengthening, rolling L/R on mat and breaking down parts of rolling, did she get more visits scheduled (2x/week for 4 weeks starting 1st week of August) - will recert at 7/14 visit    Waddell Southgate, PT Waddell Southgate, PT, DPT, CSRS  12/12/2023, 11:05 AM
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
## 2023-12-13 DIAGNOSIS — L89312 Pressure ulcer of right buttock, stage 2: Secondary | ICD-10-CM | POA: Diagnosis not present

## 2023-12-13 DIAGNOSIS — S3992XA Unspecified injury of lower back, initial encounter: Secondary | ICD-10-CM | POA: Diagnosis not present

## 2023-12-14 ENCOUNTER — Ambulatory Visit: Admitting: Occupational Therapy

## 2023-12-14 ENCOUNTER — Ambulatory Visit: Attending: Physical Medicine and Rehabilitation | Admitting: Physical Therapy

## 2023-12-14 ENCOUNTER — Encounter: Payer: Self-pay | Admitting: Physical Therapy

## 2023-12-14 DIAGNOSIS — R29898 Other symptoms and signs involving the musculoskeletal system: Secondary | ICD-10-CM | POA: Diagnosis not present

## 2023-12-14 DIAGNOSIS — M25642 Stiffness of left hand, not elsewhere classified: Secondary | ICD-10-CM

## 2023-12-14 DIAGNOSIS — M25641 Stiffness of right hand, not elsewhere classified: Secondary | ICD-10-CM | POA: Diagnosis not present

## 2023-12-14 DIAGNOSIS — M24542 Contracture, left hand: Secondary | ICD-10-CM | POA: Insufficient documentation

## 2023-12-14 DIAGNOSIS — R29818 Other symptoms and signs involving the nervous system: Secondary | ICD-10-CM | POA: Diagnosis not present

## 2023-12-14 DIAGNOSIS — R208 Other disturbances of skin sensation: Secondary | ICD-10-CM | POA: Diagnosis present

## 2023-12-14 DIAGNOSIS — M6281 Muscle weakness (generalized): Secondary | ICD-10-CM

## 2023-12-14 DIAGNOSIS — R278 Other lack of coordination: Secondary | ICD-10-CM

## 2023-12-14 DIAGNOSIS — G8254 Quadriplegia, C5-C7 incomplete: Secondary | ICD-10-CM | POA: Diagnosis not present

## 2023-12-14 DIAGNOSIS — M24541 Contracture, right hand: Secondary | ICD-10-CM | POA: Insufficient documentation

## 2023-12-14 DIAGNOSIS — R293 Abnormal posture: Secondary | ICD-10-CM | POA: Diagnosis not present

## 2023-12-14 NOTE — Therapy (Signed)
 OUTPATIENT PHYSICAL THERAPY NEURO TREATMENT   Patient Name: [name] MRN: [id number] DOB:08-14-1950, 73 y.o., female Today's Date: 12/14/2023   PCP: [name], MD REFERRING PROVIDER: [name], MD   END OF SESSION:    PT End of Session - 12/14/23 1134     Visit Number 27    Number of Visits 29   recert   Date for PT Re-Evaluation 01/01/24   to allow for scheduling delays   Authorization Type [employer] MEDICARE    PT Start Time 1145    PT Stop Time 1234    PT Time Calculation (min) 49 min    Equipment Utilized During Treatment Gait belt    Activity Tolerance Patient tolerated treatment well    Behavior During Therapy WFL for tasks assessed/performed                          Past Medical History:  Diagnosis Date   CERVICAL POLYP 03/11/2008   Qualifier: Diagnosis of  By: Charlett MD, Apolinar POUR    Colon polyps 2005   on colonscopy Dr. Aneita   Fibroid 2004   Per Dr. Lenon   History of shingles    face and mouth   Hx of skin cancer, basal cell    Rosacea    Sciatica of left side 09/28/2013   Scoliosis    noted on mri done for back pain   Past Surgical History:  Procedure Laterality Date   BUNIONECTOMY     Patient Active Problem List   Diagnosis Date Noted   Buttock wound, left, subsequent encounter 03/16/2023   Bronchiectasis with acute exacerbation (HCC) 03/15/2023   Buttock wound, left, initial encounter 11/02/2022   Orthostatic hypotension 08/13/2022   Neurogenic bowel 05/03/2022   Spasticity 05/03/2022   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/03/2022   Nerve pain 05/03/2022   Medication monitoring encounter 01/08/2022   Neurogenic bladder 10/11/2021   Urinary incontinence 10/11/2021   ESBL (extended spectrum beta-lactamase) producing bacteria infection 10/09/2021   Recurrent UTI 10/09/2021   Quadriplegia, C5-C7 incomplete (HCC) 01/16/2021   History of spinal fracture 01/16/2021   Suprapubic catheter (HCC) 01/16/2021   Encounter for  routine gynecological examination 09/28/2013   Onychomycosis 09/28/2013   Foot deformity, acquired 03/26/2012   Encounter for preventive health examination 12/25/2010   ROSACEA 08/25/2009   Disturbance in sleep behavior 03/11/2008   SKIN CANCER, HX OF 03/11/2008   DYSURIA, HX OF 03/11/2008   Hyperlipidemia 02/10/2007   CERVICALGIA 02/10/2007    ONSET DATE: 08/29/2023 (referral date)  REFERRING DIAG: G82.54 (ICD-10-CM) - Incomplete quadriplegia at C5-C8 level (HCC)  THERAPY DIAG:  Other lack of coordination  Muscle weakness (generalized)  Other symptoms and signs involving the nervous system  Abnormal posture  Other symptoms and signs involving the musculoskeletal system  Rationale for Evaluation and Treatment: Rehabilitation  SUBJECTIVE:  SUBJECTIVE STATEMENT: Pt received following OT in [facility] w/ caregiver, [name].  She scheduled out through August at last appt.  Pt denies any acute changes since last visit.  She got her new cushion from [employer].  She feels this cushion is thicker and she may need adjustment to wheelchair component heights.  The cushion forces her to be more upright.  Her toes did not bother today and her other spasms are only happening in the morning which is pretty normal for her.  She plans to visit her mom for a weekend in August.  She leaves for [place] on 7/25.  From initial eval: Pt familiar to this clinic, last seen Oct-Nov 2024 but has been seen for multiple POCs since her initial injury in 2022. Pt returns to this clinic after being in [place]  for the past few months. While in [place]  in December 2024 patient had a fall where she slid forwards out of her wheelchair onto the floor, ended up fracturing both of her femurs. Pt was hospitalized for 10-11 days and had surgical  repair of her femurs. Pt reports she has been cleared of all restrictions since surgery, does have ongoing swelling in both legs and worsened spasticity. Pt reports that the spasticity has slightly improved since it initially started, was told by Dr. Lovorn the swelling may not resolve for 6-8 months (it has been 3 months) and not sure if spasticity will resolve but patient is hopeful that if her swelling improves her spasticity will improve as well.  Pt asking about other options to help manage the swelling her legs, has tried variable compression stockings (knee high) and her PTs in [place]  recommended tight shapewear. Pt reports that the knee-high compression stockings led to increased swelling in her knees and the shapewear did not help her swelling. Encouraged patient to get thigh-high compression stockings and elevate her LE in PWC. Pt has ordered an articulating bed that will get here next Monday (3/31) so she can better elevate her legs when in bed.  Pt is also concerned about decreased ROM in her legs, [name] but she feels she has more motion in her LLE as compared to RLE and may have mild foot drop on her R side. Pt does have PRAFOs to wear at night. Pt has worked up to standing in her standing frame x 30-40 min at a time. Pt also worked on standing with her PT and in // bars in [place] . Pt also reports with her injuries she lost the ability to lock/unlock her R knee but that it is getting better. She reports she lost a lot of stamina during her hospital stay as well.  Pt also has a new wound since last seen in this clinic in her L gluteal fold, shearing injury. Pt was seeing wound care in [place]  and is scheduled to see wound care with [facility] early April (was not able to schedule with [facility] wound care until late April).  Pt accompanied by: self and nurse [name]  PERTINENT HISTORY: C7 ASIA C- incomplete quad w/ neurogenic bladder and bowel, HLD, Hx of skin  cancer  PAIN:  Are you having pain? Yes: NPRS scale: 3-4 Pain location: elbows to fingertips on both arms Pain description: nerve pain Aggravating factors: not stated Relieving factors: not stated  PRECAUTIONS: Fall and Other: osteoporosis  RED FLAGS: None   WEIGHT BEARING RESTRICTIONS: No  FALLS: Has patient fallen in last 6 months? Yes. Number of falls 1 fall in  [place]  that resulted in B femur fractures  LIVING ENVIRONMENT: Lives with: lives with their spouse and and with full-time caregiver [name] Lives in: House/apartment Home is power wheelchair accessible Has following equipment at home: Wheelchair (power), Wheelchair (manual), Grab bars, Ramped entry, and standing frame, slide board  PLOF: Independent with household mobility with device, Independent with community mobility with device, Requires assistive device for independence, Needs assistance with ADLs, and Needs assistance with transfers  PATIENT GOALS: still working on stand and pivot with goal to pivot to commode or to a chair work on core-will help me with standing improve my stamina - being in the hospital I lost strength/endurance   OBJECTIVE:  Note: Objective measures were completed at Evaluation unless otherwise noted.  DIAGNOSTIC FINDINGS: None update/relevant to this POC  COGNITION: Overall cognitive status: Within functional limits for tasks assessed   SENSATION: Decreased sensation in BUE and BLE secondary to incomplete quadriplegia Decreased sensation in proximal LLE as compared to distal LE  EDEMA:  Circumferential: R knee: 17; L knee: 17.5 and Figure 8: R ankle 21, L ankle 21.5  MUSCLE TONE: increased spasticity in BLE   POSTURE: rounded shoulders and forward head  LOWER EXTREMITY ROM:     Passive  Right Eval Left Eval  Hip flexion Tight hip flexors Tight hip flexors  Hip extension    Hip abduction    Hip adduction    Hip internal rotation    Hip external rotation    Knee  flexion Tight HS Tight HS  Knee extension    Ankle dorsiflexion Decreased, tight gastroc Decreased, tight gastroc  Ankle plantarflexion    Ankle inversion    Ankle eversion     (Blank rows = not tested)  LOWER EXTREMITY MMT:    MMT Right Eval Left Eval  Hip flexion 1 2-  Hip extension    Hip abduction    Hip adduction    Hip internal rotation    Hip external rotation    Knee flexion 0 3  Knee extension 2- 2-  Ankle dorsiflexion 2- 3  Ankle plantarflexion    Ankle inversion    Ankle eversion    (Blank rows = not tested)  BED MOBILITY:  From previous POC: Sit to supine Mod A Supine to sit Mod A Rolling to Right Mod A Rolling to Left Mod A Undulating mattress for wound management on standard bed (elevated-so often doing uphill sliding board transfers); she would like to continue working on sitting up independently, she has been working on rolling, needs less assistance w/ this when someone props her leg into hooklying; would like something to help her pull her left leg to her butt for stretching as well as bed mobility.  TRANSFERS: From previous POC: Pt continues using combination of bump over, slide board, and depression (squat pivot) transfers.  TREATMENT:   Self-Care/Home Management Discussed patient's personal therapy goals, changes to POC focus, and plan for re-cert in 2 visits to justify addition of therapy visits. Adjusted schedule per request  NMR Pt received seated in her PWC. Bump transfer across slide board PWC to mat table with min A for some LE management. Pt transferred modA to supine w/ repeated bridging to midline of mat in preparation for rolling > 2 attempts at rolling each side progressing from minA to CGA rolling to left, pt prefers having knee bent to facilitate roll vs crossed ankles From left side-lying into prone minA for  trunk support to move LUE overhead for more independence w/ rolling into elbow prop > bilateral quad stretching w/ more resistance noted on LLE > towel under anterior hips for assisted elbow push-up onto knees x2 rounds of 5-7 reps PT provides setup of LE for right posterior roll into supine requiring modA to initiate since attempting w/ weaker UE prop BLE stretching IR/ER at 90 degrees > hip and knee flexion ROM > DF stretch > brief and gentle hamstring stretch mostly focused on RLE to neutral vs overstretching Bridging x5 modA for hip and trunk adjustment to EOM in preparation for sit at EOM > swung into sit EOM +2 w/ pt using forearms to pull trunk upright (modA) Bump transfer (downhill) back to [facility], nurse assists pt with adjustments once in chair.  PATIENT EDUCATION: Education details: Continue stretching program and UB strengthening exercises, see self-care for further. Person educated: Patient and Arts administrator Education method: Explanation, Demonstration, Tactile cues, and Verbal cues Education comprehension: verbalized understanding and returned demonstration  HOME EXERCISE PROGRAM: Access Code: [email or web address] URL: [email or web address] Date: 11/22/2023 Prepared by: Waddell Southgate  Exercises - Seated Elbow Extension with Self-Anchored Resistance  - 1 x daily - 7 x weekly - 3 sets - 10 reps - Supine Elbow Flexion Extension with Dumbbell  - 1 x daily - 7 x weekly - 3 sets - 10 reps - Seated Single Arm Elbow Extension Push-up on Table  - 1 x daily - 7 x weekly - 3 sets - 10 reps - Wheelchair Push-Up (AKA)  - 1 x daily - 7 x weekly - 3 sets - 5 reps - Seated Biceps Curl  - 1 x daily - 7 x weekly - 3 sets - 10 reps - Forearm Pronation and Supination with Hammer  - 1 x daily - 7 x weekly - 3 sets - 10 reps - Seated Wrist Extension with Dumbbell  - 1 x daily - 7 x weekly - 3 sets - 10 reps  GOALS: Goals reviewed with patient? Yes  SHORT TERM GOALS: Target date:  10/07/2023  HEP to be established for stretching and strengthening as appropriate. Baseline: not established at initial eval, reviewed during PT POC - pt and [name] report it is going well at home (4/25) Goal status: MET  2.  Pt will perform sit to stand transfer with LRAD with mod A Baseline: max A to stedy (4/4), max A in // bars (4/25) Goal status: IN PROGRESS  3.  Pt to tolerate standing x 5 min with LRAD to demonstrate improved endurance Baseline: 30 sec in stedy (initial), 2:35 min in // bars (4/25) Goal status: IN PROGRESS  4.  Pt to demonstrate reduced edema in BLE with a reduction in circumference/figure 8 measurement by 0.5 from initial evaluation. Baseline: Circumferential: R knee: 17; L knee: 17.5 and Figure 8: R ankle 21, L ankle 21.5 4/25: R knee  16, L knee: 16 11/16, R ankle: 21, L ankle 20 Goal status: IN PROGRESS   LONG TERM GOALS: Target date: 11/04/2023  Patient and her caregiver to be independent with performance of HEP for stretching and strengthening as appropriate. Baseline: not established at initial eval, reviewed during PT POC - pt and [name] report it is going well at home (4/25) Goal status: MET  2.  Pt will perform sit to stand transfer with LRAD with min A Baseline: max A to stedy (4/4), max A in // bars (4/25), max A in // bars (5/22) Goal status: NOT MET  3.  Pt to tolerate standing x 5 min with LRAD to demonstrate improved endurance Baseline: 30 sec in stedy (initial), 2:35 min in // bars (4/25), 3 min in // bars (5/22) Goal status: NOT MET  4.  Pt to demonstrate reduced edema in BLE with a reduction in circumference/figure 8 measurement by 1 from initial evaluation. Baseline: Circumferential:  R knee: 17; L knee: 17.5 and Figure 8: R ankle 21, L ankle 21.5 4/25: R knee 16, L knee: 16 11/16, R ankle: 21, L ankle 20 5/30: R knee: 16, L knee: 16, R ankle: 20.5, L ankle: 20 Goal status: PARTIALLY MET   NEW SHORT TERM GOALS:    Target date: 12/04/2023  Pt will perform sit to stand transfer with LRAD with mod A Baseline: max A to stedy (4/4), max A in // bars (4/25), max A in // bars (5/22), max A to +2 in // bars (6/18) Goal status: NOT MET  2.  Pt to tolerate standing x 4 min with LRAD to demonstrate improved endurance Baseline: 30 sec in stedy (initial), 2:35 min in // bars (4/25), 3 min in // bars (5/22), 4 min in // bars (6/18) Goal status: MET  3.  Pt will progress to performing bump transfers with mod A at the most for increased independence with functional mobility Baseline: up to max A (5/30), min A (6/18) Goal status: MET   NEW LONG TERM GOALS:  Target date: 12/26/2023  Pt will perform sit to stand transfer with LRAD with min A Baseline: max A to stedy (4/4), max A in // bars (4/25), max A in // bars (5/22), max A to +2 in // bars (6/18) Goal status: REVISED  2.  Pt to tolerate standing x 5 min with LRAD to demonstrate improved endurance Baseline: 30 sec in stedy (initial), 2:35 min in // bars (4/25), 3 min in // bars (5/22), 4 min in // bars (6/18) Goal status: REVISED  3.  Pt will progress to performing bump transfers with min A at the most for increased independence with functional mobility Baseline: up to max A (5/30), min A (6/18) Goal status: MET  4.  Pt will be able to maintain dynamic sitting balance in long-sit position x 5 min while performing a functional task with no more than mod A Baseline: max A (5/30) Goal status: INITIAL    ASSESSMENT:  CLINICAL IMPRESSION: Emphasis of skilled PT session on stretching, prone positioning, and periscapular engagement to support functional UE use.  Pt seemed to be having a much better time rolling this visit requiring much less assist (maximum need of minA).  She is due for re-cert in coming visits with PT to further focus on slight functional setback she has had with proximal stability and core strength impacting her standing initiation and  tolerance.  PT will continue per POC.  OBJECTIVE IMPAIRMENTS: decreased balance, decreased  endurance, decreased mobility, difficulty walking, decreased ROM, decreased strength, increased edema, impaired perceived functional ability, increased muscle spasms, impaired flexibility, impaired sensation, impaired tone, impaired UE functional use, postural dysfunction, and pain.   ACTIVITY LIMITATIONS: carrying, lifting, bending, standing, stairs, transfers, bed mobility, continence, bathing, toileting, dressing, reach over head, and hygiene/grooming  PARTICIPATION LIMITATIONS: meal prep, cleaning, laundry, driving, shopping, community activity, and occupation  PERSONAL FACTORS: Age, Sex, Time since onset of injury/illness/exacerbation, and 1-2 comorbidities:   C7 ASIA C- incomplete quad w/ neurogenic bladder and bowel, HLD, Hx of skin cancerare also affecting patient's functional outcome.   REHAB POTENTIAL: Good  CLINICAL DECISION MAKING: Stable/uncomplicated  EVALUATION COMPLEXITY: High  PLAN:  PT FREQUENCY: 2x/week  PT DURATION: 8 weeks  PLANNED INTERVENTIONS: [id number]- PT Re-evaluation, 97110-Therapeutic exercises, 97530- Therapeutic activity, 97112- Neuromuscular re-education, 97535- Self Care, [id number]- Manual therapy, [phone]- Gait training, [phone]- Orthotic Fit/training, [phone]- Electrical stimulation (manual), Patient/Family education, Balance training, Stair training, Taping, Dry Needling, Joint mobilization, Scar mobilization, Compression bandaging, DME instructions, Wheelchair mobility training, Cryotherapy, and Moist heat  PLAN FOR NEXT SESSION: sit to stands, standing tolerance with stedy, in // bars, possibly with RW, core strengthening/stability, endurance; wants to have conversation about taking a break (3-6 months) after this POC near end of this POC, standing lateral weight shifts, mini-squats in standing?, Yoga block press-ups vs push-up blocks, long-sitting - unsupported for core work?,  bump transfers with and without slide board - pt wants to work on this skill!, lateral leans, work on mini squats in standing frame, prone UB strengthening, rolling L/R on mat and breaking down parts of rolling, did she get more visits scheduled (2x/week for 4 weeks starting 1st week of August)    Fannye Myer B Truett Mcfarlan, PT, DPT  12/14/2023, 3:29 PM
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
## 2023-12-18 ENCOUNTER — Encounter: Payer: Self-pay | Admitting: Internal Medicine

## 2023-12-19 ENCOUNTER — Ambulatory Visit: Admitting: Physical Therapy

## 2023-12-19 ENCOUNTER — Ambulatory Visit: Payer: Self-pay

## 2023-12-19 ENCOUNTER — Ambulatory Visit: Admitting: Occupational Therapy

## 2023-12-19 DIAGNOSIS — M24542 Contracture, left hand: Secondary | ICD-10-CM

## 2023-12-19 DIAGNOSIS — R29898 Other symptoms and signs involving the musculoskeletal system: Secondary | ICD-10-CM

## 2023-12-19 DIAGNOSIS — M25641 Stiffness of right hand, not elsewhere classified: Secondary | ICD-10-CM

## 2023-12-19 DIAGNOSIS — R278 Other lack of coordination: Secondary | ICD-10-CM

## 2023-12-19 DIAGNOSIS — R29818 Other symptoms and signs involving the nervous system: Secondary | ICD-10-CM | POA: Diagnosis not present

## 2023-12-19 DIAGNOSIS — M6281 Muscle weakness (generalized): Secondary | ICD-10-CM | POA: Diagnosis not present

## 2023-12-19 DIAGNOSIS — G8254 Quadriplegia, C5-C7 incomplete: Secondary | ICD-10-CM | POA: Diagnosis not present

## 2023-12-19 DIAGNOSIS — R293 Abnormal posture: Secondary | ICD-10-CM | POA: Diagnosis not present

## 2023-12-19 DIAGNOSIS — M25642 Stiffness of left hand, not elsewhere classified: Secondary | ICD-10-CM

## 2023-12-19 DIAGNOSIS — M24541 Contracture, right hand: Secondary | ICD-10-CM

## 2023-12-19 NOTE — Telephone Encounter (Signed)
Pt has appt 7/8.

## 2023-12-19 NOTE — Telephone Encounter (Signed)
   FYI Only or Action Required?: FYI only for provider.  Patient was last seen in primary care on 09/14/2023 by Panosh, Apolinar POUR, MD. Called Nurse Triage reporting Hematuria. Symptoms began December and ongoing. Interventions attempted: Nothing. Symptoms are: gradually worsening.  Triage Disposition: See HCP Within 4 Hours (Or PCP Triage)  Patient/caregiver understands and will follow disposition?: YesCopied from CRM [id number]. Topic: Clinical - Red Word Triage >> Dec 19, 2023  9:25 AM Hamdi H wrote: Red Word that prompted transfer to Nurse Triage: Blood in urine Reason for Disposition  [1] Pain or burning with passing urine AND [2] side (flank) or back pain present  Answer Assessment - Initial Assessment Questions 1. COLOR of URINE: Describe the color of the urine.  (e.g., tea-colored, pink, red, bloody) Do you have blood clots in your urine? (e.g., none, pea, grape, small coin)     Red/yellow no clots 2. ONSET: When did the bleeding start?      December and ongoing 3. EPISODES: How many times has there been blood in the urine? or How many times today?     Every time urinates has blood in urine 4. PAIN with URINATION: Is there any pain with passing your urine? If Yes, ask: How bad is the pain?  (Scale 1-10; or mild, moderate, severe)    - MILD: Complains slightly about urination hurting.    - MODERATE: Interferes with normal activities.      - SEVERE: Excruciating, unwilling or unable to urinate because of the pain.      no 5. FEVER: Do you have a fever? If Yes, ask: What is your temperature, how was it measured, and when did it start?     no 6. ASSOCIATED SYMPTOMS: Are you passing urine more frequently than usual?     na 7. OTHER SYMPTOMS: Do you have any other symptoms? (e.g., back/flank pain, abdomen pain, vomiting)     no 8. PREGNANCY: Is there any chance you are pregnant? When was your last menstrual period?     No   Pt concerned of possibly having anemia  due to the length of time bleeding.  Pt is a spinal cord pt with a indwelling cath.  Protocols used: Urine - Blood In-A-AH
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
## 2023-12-19 NOTE — Therapy (Signed)
 OUTPATIENT PHYSICAL THERAPY NEURO TREATMENT   Patient Name: [name] MRN: [id number] DOB:1951/02/22, 73 y.o., female Today's Date: 12/19/2023   PCP: [name], MD REFERRING PROVIDER: [name], MD   END OF SESSION:  PT End of Session - 12/19/23 1601     Visit Number 28    Number of Visits 29   recert   Date for PT Re-Evaluation 01/01/24   to allow for scheduling delays   Authorization Type [employer] MEDICARE    PT Start Time 1019    PT Stop Time 1103    PT Time Calculation (min) 44 min    Equipment Utilized During Treatment Gait belt    Activity Tolerance Patient tolerated treatment well    Behavior During Therapy WFL for tasks assessed/performed         Past Medical History:  Diagnosis Date   CERVICAL POLYP 03/11/2008   Qualifier: Diagnosis of  By: Charlett MD, Apolinar POUR    Colon polyps 2005   on colonscopy Dr. Aneita   Fibroid 2004   Per Dr. Lenon   History of shingles    face and mouth   Hx of skin cancer, basal cell    Rosacea    Sciatica of left side 09/28/2013   Scoliosis    noted on mri done for back pain   Past Surgical History:  Procedure Laterality Date   BUNIONECTOMY     Patient Active Problem List   Diagnosis Date Noted   Buttock wound, left, subsequent encounter 03/16/2023   Bronchiectasis with acute exacerbation (HCC) 03/15/2023   Buttock wound, left, initial encounter 11/02/2022   Orthostatic hypotension 08/13/2022   Neurogenic bowel 05/03/2022   Spasticity 05/03/2022   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/03/2022   Nerve pain 05/03/2022   Medication monitoring encounter 01/08/2022   Neurogenic bladder 10/11/2021   Urinary incontinence 10/11/2021   ESBL (extended spectrum beta-lactamase) producing bacteria infection 10/09/2021   Recurrent UTI 10/09/2021   Quadriplegia, C5-C7 incomplete (HCC) 01/16/2021   History of spinal fracture 01/16/2021   Suprapubic catheter (HCC) 01/16/2021   Encounter for routine gynecological examination  09/28/2013   Onychomycosis 09/28/2013   Foot deformity, acquired 03/26/2012   Encounter for preventive health examination 12/25/2010   ROSACEA 08/25/2009   Disturbance in sleep behavior 03/11/2008   SKIN CANCER, HX OF 03/11/2008   DYSURIA, HX OF 03/11/2008   Hyperlipidemia 02/10/2007   CERVICALGIA 02/10/2007    ONSET DATE: 08/29/2023 (referral date)  REFERRING DIAG: G82.54 (ICD-10-CM) - Incomplete quadriplegia at C5-C8 level (HCC)  THERAPY DIAG:  Other lack of coordination  Muscle weakness (generalized)  Other symptoms and signs involving the nervous system  Abnormal posture  Other symptoms and signs involving the musculoskeletal system  Rationale for Evaluation and Treatment: Rehabilitation  SUBJECTIVE:  SUBJECTIVE STATEMENT: Pt received following from lobby in [facility] w/ caregiver, [name].  Pt denies any acute changes since last visit, but is concerned for possible anemia as she has been experiencing hematuria recently.  They scheduled labs for tomorrow to hopefully sort this out.  She has [employer] coming to her home tomorrow to adjust/raise height of some features of her wheelchair where she has been less supported since change in cushion height - she reports she is somewhat sliding forward.  She requests no standing until anemia question is sorted out.  From initial eval: Pt familiar to this clinic, last seen Oct-Nov 2024 but has been seen for multiple POCs since her initial injury in 2022. Pt returns to this clinic after being in [place]  for the past few months. While in [place]  in December 2024 patient had a fall where she slid forwards out of her wheelchair onto the floor, ended up fracturing both of her femurs. Pt was hospitalized for 10-11 days and had surgical repair of her femurs. Pt reports  she has been cleared of all restrictions since surgery, does have ongoing swelling in both legs and worsened spasticity. Pt reports that the spasticity has slightly improved since it initially started, was told by Dr. Lovorn the swelling may not resolve for 6-8 months (it has been 3 months) and not sure if spasticity will resolve but patient is hopeful that if her swelling improves her spasticity will improve as well.  Pt asking about other options to help manage the swelling her legs, has tried variable compression stockings (knee high) and her PTs in [place]  recommended tight shapewear. Pt reports that the knee-high compression stockings led to increased swelling in her knees and the shapewear did not help her swelling. Encouraged patient to get thigh-high compression stockings and elevate her LE in PWC. Pt has ordered an articulating bed that will get here next Monday (3/31) so she can better elevate her legs when in bed.  Pt is also concerned about decreased ROM in her legs, [name] but she feels she has more motion in her LLE as compared to RLE and may have mild foot drop on her R side. Pt does have PRAFOs to wear at night. Pt has worked up to standing in her standing frame x 30-40 min at a time. Pt also worked on standing with her PT and in // bars in [place] . Pt also reports with her injuries she lost the ability to lock/unlock her R knee but that it is getting better. She reports she lost a lot of stamina during her hospital stay as well.  Pt also has a new wound since last seen in this clinic in her L gluteal fold, shearing injury. Pt was seeing wound care in [place]  and is scheduled to see wound care with [facility] early April (was not able to schedule with [facility] wound care until late April).  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 Yes: NPRS scale: 3-4 Pain location: elbows to fingertips on both arms Pain description: nerve pain Aggravating factors: not stated Relieving factors: not stated  PRECAUTIONS: Fall and Other: osteoporosis  RED FLAGS: None   WEIGHT BEARING RESTRICTIONS: No  FALLS: Has patient fallen in last 6 months? Yes. Number of falls 1 fall in [place]  that resulted in B femur fractures  LIVING ENVIRONMENT: Lives  with: lives with their spouse and and with full-time caregiver [name] Lives in: House/apartment Home is power wheelchair accessible Has following equipment at home: Wheelchair (power), Wheelchair (manual), Grab bars, Ramped entry, and standing frame, slide board  PLOF: Independent with household mobility with device, Independent with community mobility with device, Requires assistive device for independence, Needs assistance with ADLs, and Needs assistance with transfers  PATIENT GOALS: still working on stand and pivot with goal to pivot to commode or to a chair work on core-will help me with standing improve my stamina - being in the hospital I lost strength/endurance   OBJECTIVE:  Note: Objective measures were completed at Evaluation unless otherwise noted.  DIAGNOSTIC FINDINGS: None update/relevant to this POC  COGNITION: Overall cognitive status: Within functional limits for tasks assessed   SENSATION: Decreased sensation in BUE and BLE secondary to incomplete quadriplegia Decreased sensation in proximal LLE as compared to distal LE  EDEMA:  Circumferential: R knee: 17; L knee: 17.5 and Figure 8: R ankle 21, L ankle 21.5  MUSCLE TONE: increased spasticity in BLE   POSTURE: rounded shoulders and forward head  LOWER EXTREMITY ROM:     Passive  Right Eval Left Eval  Hip flexion Tight hip flexors Tight hip flexors  Hip extension    Hip abduction    Hip adduction    Hip internal rotation    Hip external rotation    Knee flexion Tight HS Tight HS  Knee  extension    Ankle dorsiflexion Decreased, tight gastroc Decreased, tight gastroc  Ankle plantarflexion    Ankle inversion    Ankle eversion     (Blank rows = not tested)  LOWER EXTREMITY MMT:    MMT Right Eval Left Eval  Hip flexion 1 2-  Hip extension    Hip abduction    Hip adduction    Hip internal rotation    Hip external rotation    Knee flexion 0 3  Knee extension 2- 2-  Ankle dorsiflexion 2- 3  Ankle plantarflexion    Ankle inversion    Ankle eversion    (Blank rows = not tested)  BED MOBILITY:  From previous POC: Sit to supine Mod A Supine to sit Mod A Rolling to Right Mod A Rolling to Left Mod A Undulating mattress for wound management on standard bed (elevated-so often doing uphill sliding board transfers); she would like to continue working on sitting up independently, she has been working on rolling, needs less assistance w/ this when someone props her leg into hooklying; would like something to help her pull her left leg to her butt for stretching as well as bed mobility.  TRANSFERS: From previous POC: Pt continues using combination of bump over, slide board, and depression (squat pivot) transfers.  TREATMENT:   NMR Pt received seated in her PWC. Bump transfer to left from [facility] to mat table with min A for some LE management. Pt in short sitting at EOM: Forward lean to limits of stability several reps > laterally several reps each side w/ reduced ROM to left, she requires minA due to repeated LOB often posteriorly when attempting return to midline from challenging distance Lap raise to forward lean w/ 2lb dowel rod - no active hands today, pt requires reduced ROM to complete Used a towel to facilitate thoracic extension and cue APT for several repetitions x2 rounds to fatigue.  She requires posterior support during recovery periods.   Discussed practicing postural corrections in wheelchair at home.  Progressed to towel facilitated trunk rotation for pelvic offloading several reps each side. Seated alternating UE balloon taps focusing on pelvic to neck postural correction, pt has severe posterior LOB resulting in laying supine on the mat on last rep.  Returned to short sit maxA. Bump transfer (downhill) on slideboard back to [facility] to right, nurse assists pt with adjustments once in chair.  PATIENT EDUCATION: Education details: Continue stretching program and UB strengthening exercises, see self-care for further. Person educated: Patient and Arts administrator Education method: Explanation, Demonstration, Tactile cues, and Verbal cues Education comprehension: verbalized understanding and returned demonstration  HOME EXERCISE PROGRAM: Access Code: [email or web address] URL: [email or web address] Date: 11/22/2023 Prepared by: Waddell Southgate  Exercises - Seated Elbow Extension with Self-Anchored Resistance  - 1 x daily - 7 x weekly - 3 sets - 10 reps - Supine Elbow Flexion Extension with Dumbbell  - 1 x daily - 7 x weekly - 3 sets - 10 reps - Seated Single Arm Elbow Extension Push-up on Table  - 1 x daily - 7 x weekly - 3 sets - 10 reps - Wheelchair Push-Up (AKA)  - 1 x daily - 7 x weekly - 3 sets - 5 reps - Seated Biceps Curl  - 1 x daily - 7 x weekly - 3 sets - 10 reps - Forearm Pronation and Supination with Hammer  - 1 x daily - 7 x weekly - 3 sets - 10 reps - Seated Wrist Extension with Dumbbell  - 1 x daily - 7 x weekly - 3 sets - 10 reps  GOALS: Goals reviewed with patient? Yes  SHORT TERM GOALS: Target date: 10/07/2023  HEP to be established for stretching and strengthening as appropriate. Baseline: not established at initial eval, reviewed during PT POC - pt and [name] report it is going well at home (4/25) Goal status: MET  2.  Pt will perform sit to stand transfer with LRAD with mod A Baseline: max A to stedy  (4/4), max A in // bars (4/25) Goal status: IN PROGRESS  3.  Pt to tolerate standing x 5 min with LRAD to demonstrate improved endurance Baseline: 30 sec in stedy (initial), 2:35 min in // bars (4/25) Goal status: IN PROGRESS  4.  Pt to demonstrate reduced edema in BLE with a reduction in circumference/figure 8 measurement by 0.5 from initial evaluation. Baseline: Circumferential: R knee: 17; L knee: 17.5 and Figure 8: R ankle 21, L ankle 21.5 4/25: R knee 16, L knee: 16 11/16, R ankle: 21, L ankle 20 Goal status: IN PROGRESS   LONG TERM GOALS: Target date: 11/04/2023  Patient and her caregiver to be independent with performance of HEP for stretching and strengthening as appropriate. Baseline: not established at initial eval, reviewed during PT POC -  pt and [name] report it is going well at home (4/25) Goal status: MET  2.  Pt will perform sit to stand transfer with LRAD with min A Baseline: max A to stedy (4/4), max A in // bars (4/25), max A in // bars (5/22) Goal status: NOT MET  3.  Pt to tolerate standing x 5 min with LRAD to demonstrate improved endurance Baseline: 30 sec in stedy (initial), 2:35 min in // bars (4/25), 3 min in // bars (5/22) Goal status: NOT MET  4.  Pt to demonstrate reduced edema in BLE with a reduction in circumference/figure 8 measurement by 1 from initial evaluation. Baseline: Circumferential:  R knee: 17; L knee: 17.5 and Figure 8: R ankle 21, L ankle 21.5 4/25: R knee 16, L knee: 16 11/16, R ankle: 21, L ankle 20 5/30: R knee: 16, L knee: 16, R ankle: 20.5, L ankle: 20 Goal status: PARTIALLY MET   NEW SHORT TERM GOALS:   Target date: 12/04/2023  Pt will perform sit to stand transfer with LRAD with mod A Baseline: max A to stedy (4/4), max A in // bars (4/25), max A in // bars (5/22), max A to +2 in // bars (6/18) Goal status: NOT MET  2.  Pt to tolerate standing x 4 min with LRAD to demonstrate improved endurance Baseline: 30  sec in stedy (initial), 2:35 min in // bars (4/25), 3 min in // bars (5/22), 4 min in // bars (6/18) Goal status: MET  3.  Pt will progress to performing bump transfers with mod A at the most for increased independence with functional mobility Baseline: up to max A (5/30), min A (6/18) Goal status: MET   NEW LONG TERM GOALS:  Target date: 12/26/2023  Pt will perform sit to stand transfer with LRAD with min A Baseline: max A to stedy (4/4), max A in // bars (4/25), max A in // bars (5/22), max A to +2 in // bars (6/18) Goal status: REVISED  2.  Pt to tolerate standing x 5 min with LRAD to demonstrate improved endurance Baseline: 30 sec in stedy (initial), 2:35 min in // bars (4/25), 3 min in // bars (5/22), 4 min in // bars (6/18) Goal status: REVISED  3.  Pt will progress to performing bump transfers with min A at the most for increased independence with functional mobility Baseline: up to max A (5/30), min A (6/18) Goal status: MET  4.  Pt will be able to maintain dynamic sitting balance in long-sit position x 5 min while performing a functional task with no more than mod A Baseline: max A (5/30) Goal status: INITIAL    ASSESSMENT:  CLINICAL IMPRESSION: Emphasis of skilled PT session on short sitting balance.  She has had a marked decline in her tolerance to reaching in sitting as well as core control resulting in several posterior LOB this visit.  She continues to maintain activity at home with caregiver so PT is uncertain what has caused her recent decline in functional capacity.  Will follow-up with pt regarding upcoming lab results in hopes it will provide some input to her recent difficulties as she continues to be motivated and has great potential to progress with skilled services.  Will assess goals and complete re-certification at next visit.  PT will continue per POC.  OBJECTIVE IMPAIRMENTS: decreased balance, decreased endurance, decreased mobility, difficulty walking,  decreased ROM, decreased strength, increased edema, impaired perceived functional ability, increased muscle spasms, impaired flexibility,  impaired sensation, impaired tone, impaired UE functional use, postural dysfunction, and pain.   ACTIVITY LIMITATIONS: carrying, lifting, bending, standing, stairs, transfers, bed mobility, continence, bathing, toileting, dressing, reach over head, and hygiene/grooming  PARTICIPATION LIMITATIONS: meal prep, cleaning, laundry, driving, shopping, community activity, and occupation  PERSONAL FACTORS: Age, Sex, Time since onset of injury/illness/exacerbation, and 1-2 comorbidities:   C7 ASIA C- incomplete quad w/ neurogenic bladder and bowel, HLD, Hx of skin cancerare also affecting patient's functional outcome.   REHAB POTENTIAL: Good  CLINICAL DECISION MAKING: Stable/uncomplicated  EVALUATION COMPLEXITY: High  PLAN:  PT FREQUENCY: 2x/week  PT DURATION: 8 weeks  PLANNED INTERVENTIONS: 97164- PT Re-evaluation, 97110-Therapeutic exercises, 97530- Therapeutic activity, 97112- Neuromuscular re-education, 97535- Self Care, [id number]- Manual therapy, [phone]- Gait training, [phone]- Orthotic Fit/training, [phone]- Electrical stimulation (manual), Patient/Family education, Balance training, Stair training, Taping, Dry Needling, Joint mobilization, Scar mobilization, Compression bandaging, DME instructions, Wheelchair mobility training, Cryotherapy, and Moist heat  PLAN FOR NEXT SESSION: sit to stands, standing tolerance with stedy, in // bars, possibly with RW, core strengthening/stability, endurance; wants to have conversation about taking a break (3-6 months) after this POC near end of this POC, standing lateral weight shifts, mini-squats in standing?, Yoga block press-ups vs push-up blocks, long-sitting - unsupported for core work?, bump transfers with and without slide board - pt wants to work on this skill!, lateral leans, work on mini squats in standing frame, prone UB  strengthening, rolling L/R on mat and breaking down parts of rolling, RECERT (2x/week for 4 weeks starting 1st week of August)    Sache Sane B Heidi Lemay, PT, DPT  12/19/2023, 4:40 PM

## 2023-12-20 ENCOUNTER — Ambulatory Visit: Admitting: Family Medicine

## 2023-12-20 ENCOUNTER — Telehealth: Payer: Self-pay | Admitting: Internal Medicine

## 2023-12-20 ENCOUNTER — Encounter: Payer: Self-pay | Admitting: Family Medicine

## 2023-12-20 VITALS — BP 120/68 | HR 71 | Temp 98.3°F | Ht 64.0 in | Wt 124.0 lb

## 2023-12-20 DIAGNOSIS — N390 Urinary tract infection, site not specified: Secondary | ICD-10-CM | POA: Diagnosis not present

## 2023-12-20 DIAGNOSIS — Z79899 Other long term (current) drug therapy: Secondary | ICD-10-CM | POA: Diagnosis not present

## 2023-12-20 DIAGNOSIS — L89312 Pressure ulcer of right buttock, stage 2: Secondary | ICD-10-CM | POA: Diagnosis not present

## 2023-12-20 DIAGNOSIS — R319 Hematuria, unspecified: Secondary | ICD-10-CM

## 2023-12-20 DIAGNOSIS — S14107A Unspecified injury at C7 level of cervical spinal cord, initial encounter: Secondary | ICD-10-CM | POA: Diagnosis not present

## 2023-12-20 DIAGNOSIS — D649 Anemia, unspecified: Secondary | ICD-10-CM

## 2023-12-20 DIAGNOSIS — S3992XA Unspecified injury of lower back, initial encounter: Secondary | ICD-10-CM | POA: Diagnosis not present

## 2023-12-20 DIAGNOSIS — R7989 Other specified abnormal findings of blood chemistry: Secondary | ICD-10-CM

## 2023-12-20 LAB — POC URINALSYSI DIPSTICK (AUTOMATED)
Bilirubin, UA: NEGATIVE
Blood, UA: POSITIVE
Glucose, UA: NEGATIVE
Ketones, UA: NEGATIVE
Nitrite, UA: NEGATIVE
Protein, UA: POSITIVE — AB
Spec Grav, UA: 1.01 (ref 1.010–1.025)
Urobilinogen, UA: 0.2 U/dL
pH, UA: 8.5 — AB (ref 5.0–8.0)

## 2023-12-20 MED ORDER — NITROFURANTOIN MONOHYD MACRO 100 MG PO CAPS: 100.0000 mg | ORAL_CAPSULE | Freq: Two times a day (BID) | ORAL | 0 refills | Status: DC

## 2023-12-20 NOTE — Telephone Encounter (Signed)
 Patient dropped off document medication listing for travel, to be filled out by provider. Patient requested to send it back via Call Patient to pick up within 5-days. Document is located in providers tray at front office.Please advise at Mobile [phone] (mobile)

## 2023-12-20 NOTE — Progress Notes (Signed)
   Subjective:    Patient ID: [name], female    DOB: Nov 02, 1950, 73 y.o.   MRN: [id number]  HPI Here with her assistant for seeing blood in her urine for several days. She has a suprapubic catheter so she has UTI's frequently. Her last one in April grew a Staph epidermidis strain. This was treated successfully with Macrobid . She denies any back pain or fever.    Review of Systems  Constitutional: Negative.   Respiratory: Negative.    Cardiovascular: Negative.   Gastrointestinal: Negative.   Genitourinary:  Positive for hematuria. Negative for flank pain.       Objective:   Physical Exam Constitutional:      Appearance: Normal appearance.     Comments: In a wheelchair   Cardiovascular:     Rate and Rhythm: Normal rate and regular rhythm.     Pulses: Normal pulses.     Heart sounds: Normal heart sounds.  Pulmonary:     Effort: Pulmonary effort is normal.     Breath sounds: Normal breath sounds.  Neurological:     Mental Status: She is alert.           Assessment & Plan:  UTI, treat with 7 days of Macrobid . We [name] culture he sample.  Garnette Olmsted, MD

## 2023-12-21 ENCOUNTER — Ambulatory Visit: Admitting: Physical Therapy

## 2023-12-21 ENCOUNTER — Other Ambulatory Visit: Payer: Self-pay

## 2023-12-21 ENCOUNTER — Encounter: Payer: Self-pay | Admitting: Physical Therapy

## 2023-12-21 ENCOUNTER — Ambulatory Visit: Admitting: Occupational Therapy

## 2023-12-21 DIAGNOSIS — M25641 Stiffness of right hand, not elsewhere classified: Secondary | ICD-10-CM

## 2023-12-21 DIAGNOSIS — G8254 Quadriplegia, C5-C7 incomplete: Secondary | ICD-10-CM | POA: Diagnosis not present

## 2023-12-21 DIAGNOSIS — M24542 Contracture, left hand: Secondary | ICD-10-CM

## 2023-12-21 DIAGNOSIS — R278 Other lack of coordination: Secondary | ICD-10-CM

## 2023-12-21 DIAGNOSIS — M6281 Muscle weakness (generalized): Secondary | ICD-10-CM | POA: Diagnosis not present

## 2023-12-21 DIAGNOSIS — R29818 Other symptoms and signs involving the nervous system: Secondary | ICD-10-CM

## 2023-12-21 DIAGNOSIS — R293 Abnormal posture: Secondary | ICD-10-CM

## 2023-12-21 DIAGNOSIS — M25642 Stiffness of left hand, not elsewhere classified: Secondary | ICD-10-CM | POA: Diagnosis not present

## 2023-12-21 DIAGNOSIS — R29898 Other symptoms and signs involving the musculoskeletal system: Secondary | ICD-10-CM | POA: Diagnosis not present

## 2023-12-21 DIAGNOSIS — M24541 Contracture, right hand: Secondary | ICD-10-CM

## 2023-12-21 LAB — CBC WITH DIFFERENTIAL/PLATELET
Basophils Absolute: 0 K/uL (ref 0.0–0.1)
Basophils Relative: 1.1 % (ref 0.0–3.0)
Eosinophils Absolute: 0.1 K/uL (ref 0.0–0.7)
Eosinophils Relative: 2.4 % (ref 0.0–5.0)
HCT: 36.8 % (ref 36.0–46.0)
Hemoglobin: 12.7 g/dL (ref 12.0–15.0)
Lymphocytes Relative: 33 % (ref 12.0–46.0)
Lymphs Abs: 1.5 K/uL (ref 0.7–4.0)
MCHC: 34.4 g/dL (ref 30.0–36.0)
MCV: 91.6 fl (ref 78.0–100.0)
Monocytes Absolute: 0.3 K/uL (ref 0.1–1.0)
Monocytes Relative: 7.6 % (ref 3.0–12.0)
Neutro Abs: 2.5 K/uL (ref 1.4–7.7)
Neutrophils Relative %: 55.9 % (ref 43.0–77.0)
Platelets: 179 K/uL (ref 150.0–400.0)
RBC: 4.02 Mil/uL (ref 3.87–5.11)
RDW: 13.7 % (ref 11.5–15.5)
WBC: 4.4 K/uL (ref 4.0–10.5)

## 2023-12-21 LAB — HEPATIC FUNCTION PANEL
ALT: 40 U/L — ABNORMAL HIGH (ref 0–35)
AST: 38 U/L — ABNORMAL HIGH (ref 0–37)
Albumin: 3.9 g/dL (ref 3.5–5.2)
Alkaline Phosphatase: 80 U/L (ref 39–117)
Bilirubin, Direct: 0.1 mg/dL (ref 0.0–0.3)
Total Bilirubin: 0.5 mg/dL (ref 0.2–1.2)
Total Protein: 6.7 g/dL (ref 6.0–8.3)

## 2023-12-21 LAB — URINE CULTURE
MICRO NUMBER:: 16671226
Result:: NO GROWTH
SPECIMEN QUALITY:: ADEQUATE

## 2023-12-21 LAB — IBC + FERRITIN
Ferritin: 357.7 ng/mL — ABNORMAL HIGH (ref 10.0–291.0)
Iron: 64 ug/dL (ref 42–145)
Saturation Ratios: 27.2 % (ref 20.0–50.0)
TIBC: 235.2 ug/dL — ABNORMAL LOW (ref 250.0–450.0)
Transferrin: 168 mg/dL — ABNORMAL LOW (ref 212.0–360.0)

## 2023-12-21 LAB — IRON: Iron: 64 ug/dL (ref 42–145)
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
## 2023-12-21 NOTE — Telephone Encounter (Signed)
 Already done update  med list  as indicated

## 2023-12-21 NOTE — Therapy (Signed)
 OUTPATIENT PHYSICAL THERAPY NEURO TREATMENT - RECERTIFICATION   Patient Name: [name] MRN: [id number] DOB:[birth year], 73 y.o., female Today's Date: 12/21/2023   PCP: [name], MD REFERRING PROVIDER: [name], MD   END OF SESSION:  12/21/23 1010  PT Visits / Re-Eval  Visit Number 29  Number of Visits 45 (29 + 16 at re-cert 7/9)  Date for PT Re-Evaluation 03/02/24 (to allow for scheduling delays/known upcoming pt conflicts)  Authorization  Authorization Type [employer]ICARE  PT Time Calculation  PT Start Time 1015  PT Stop Time 1102  PT Time Calculation (min) 47 min  PT - End of Session  Equipment Utilized During Treatment Gait belt  Activity Tolerance Patient tolerated treatment well  Behavior During Therapy [facility] for tasks assessed/performed   Past Medical History:  Diagnosis Date   Arthritis 2019   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 Aneita   Fibroid 2004   Per Dr. Lenon   History of shingles    face and mouth   Hx of skin cancer, basal cell    Hyperlipidemia 2022   lower legs and feet   Rosacea    Sciatica of left side 09/28/2013   Scoliosis    noted on mri done for back pain   Past Surgical History:  Procedure Laterality Date   BUNIONECTOMY     SPINE SURGERY  07/28/20   fused C5-C6   Patient Active Problem List   Diagnosis Date Noted   Buttock wound, left, subsequent encounter 03/16/2023   Bronchiectasis with acute exacerbation (HCC) 03/15/2023   Buttock wound, left, initial encounter 11/02/2022   Orthostatic hypotension 08/13/2022   Neurogenic bowel 05/03/2022   Spasticity 05/03/2022   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/03/2022   Nerve pain 05/03/2022   Medication monitoring encounter 01/08/2022   Neurogenic bladder 10/11/2021   Urinary incontinence 10/11/2021   ESBL (extended spectrum beta-lactamase) producing bacteria infection 10/09/2021    Recurrent UTI 10/09/2021   Quadriplegia, C5-C7 incomplete (HCC) 01/16/2021   History of spinal fracture 01/16/2021   Suprapubic catheter (HCC) 01/16/2021   Encounter for routine gynecological examination 09/28/2013   Onychomycosis 09/28/2013   Foot deformity, acquired 03/26/2012   Encounter for preventive health examination 12/25/2010   ROSACEA 08/25/2009   Disturbance in sleep behavior 03/11/2008   SKIN CANCER, HX OF 03/11/2008   DYSURIA, HX OF 03/11/2008   Hyperlipidemia 02/10/2007   CERVICALGIA 02/10/2007    ONSET DATE: 08/29/2023 (referral date)  REFERRING DIAG: G82.54 (ICD-10-CM) - Incomplete quadriplegia at C5-C8 level (HCC)  THERAPY DIAG:  Other lack of coordination  Muscle weakness (generalized)  Other symptoms and signs involving the nervous system  Abnormal posture  Rationale for Evaluation and Treatment: Rehabilitation  SUBJECTIVE:  SUBJECTIVE STATEMENT: Pt received following from lobby in [facility] w/ caregiver, [name].  Pt reports she has a UTI and this surprised her because she had little symptoms.  [employer] has adjusted her wheelchair, but they still need to adjust her seat depth.    From initial eval: Pt familiar to this clinic, last seen Oct-Nov 2024 but has been seen for multiple POCs since her initial injury in 2022. Pt returns to this clinic after being in [place]  for the past few months. While in [place]  in December 2024 patient had a fall where she slid forwards out of her wheelchair onto the floor, ended up fracturing both of her femurs. Pt was hospitalized for 10-11 days and had surgical repair of her femurs. Pt reports she has been cleared of all restrictions since surgery, does have ongoing swelling in both legs and worsened spasticity. Pt reports that the spasticity has  slightly improved since it initially started, was told by Dr. Lovorn the swelling may not resolve for 6-8 months (it has been 3 months) and not sure if spasticity will resolve but patient is hopeful that if her swelling improves her spasticity will improve as well.  Pt asking about other options to help manage the swelling her legs, has tried variable compression stockings (knee high) and her PTs in [place]  recommended tight shapewear. Pt reports that the knee-high compression stockings led to increased swelling in her knees and the shapewear did not help her swelling. Encouraged patient to get thigh-high compression stockings and elevate her LE in PWC. Pt has ordered an articulating bed that will get here next Monday (3/31) so she can better elevate her legs when in bed.  Pt is also concerned about decreased ROM in her legs, [name] but she feels she has more motion in her LLE as compared to RLE and may have mild foot drop on her R side. Pt does have PRAFOs to wear at night. Pt has worked up to standing in her standing frame x 30-40 min at a time. Pt also worked on standing with her PT and in // bars in [place] . Pt also reports with her injuries she lost the ability to lock/unlock her R knee but that it is getting better. She reports she lost a lot of stamina during her hospital stay as well.  Pt also has a new wound since last seen in this clinic in her L gluteal fold, shearing injury. Pt was seeing wound care in [place]  and is scheduled to see wound care with [facility] early April (was not able to schedule with [facility] wound care until late April).  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 Yes: NPRS scale: 3-4 Pain location: elbows to fingertips on both arms Pain description: nerve pain Aggravating factors: not stated Relieving factors:  not stated  PRECAUTIONS: Fall and Other: osteoporosis  RED FLAGS: None   WEIGHT BEARING RESTRICTIONS: No  FALLS: Has patient fallen in last 6 months? Yes. Number of falls 1 fall in [place]  that resulted in B femur fractures  LIVING ENVIRONMENT: Lives with: lives with their spouse and and with full-time caregiver [name] Lives in: House/apartment Home is power wheelchair accessible Has following equipment at home: Wheelchair (power), Wheelchair (manual), Grab bars, Ramped entry, and standing frame, slide board  PLOF: Independent with household mobility with device, Independent with community  mobility with device, Requires assistive device for independence, Needs assistance with ADLs, and Needs assistance with transfers  PATIENT GOALS: still working on stand and pivot with goal to pivot to commode or to a chair work on core-will help me with standing improve my stamina - being in the hospital I lost strength/endurance   OBJECTIVE:  Note: Objective measures were completed at Evaluation unless otherwise noted.  DIAGNOSTIC FINDINGS: None update/relevant to this POC  COGNITION: Overall cognitive status: Within functional limits for tasks assessed   SENSATION: Decreased sensation in BUE and BLE secondary to incomplete quadriplegia Decreased sensation in proximal LLE as compared to distal LE  EDEMA:  Circumferential: R knee: 17; L knee: 17.5 and Figure 8: R ankle 21, L ankle 21.5  MUSCLE TONE: increased spasticity in BLE   POSTURE: rounded shoulders and forward head  LOWER EXTREMITY ROM:     Passive  Right Eval Left Eval  Hip flexion Tight hip flexors Tight hip flexors  Hip extension    Hip abduction    Hip adduction    Hip internal rotation    Hip external rotation    Knee flexion Tight HS Tight HS  Knee extension    Ankle dorsiflexion Decreased, tight gastroc Decreased, tight gastroc  Ankle plantarflexion    Ankle inversion    Ankle eversion     (Blank rows  = not tested)  LOWER EXTREMITY MMT:    MMT Right Eval Left Eval  Hip flexion 1 2-  Hip extension    Hip abduction    Hip adduction    Hip internal rotation    Hip external rotation    Knee flexion 0 3  Knee extension 2- 2-  Ankle dorsiflexion 2- 3  Ankle plantarflexion    Ankle inversion    Ankle eversion    (Blank rows = not tested)  BED MOBILITY:  From previous POC: Sit to supine Mod A Supine to sit Mod A Rolling to Right Mod A Rolling to Left Mod A Undulating mattress for wound management on standard bed (elevated-so often doing uphill sliding board transfers); she would like to continue working on sitting up independently, she has been working on rolling, needs less assistance w/ this when someone props her leg into hooklying; would like something to help her pull her left leg to her butt for stretching as well as bed mobility.  TRANSFERS: From previous POC: Pt continues using combination of bump over, slide board, and depression (squat pivot) transfers.                                                                                                                              TREATMENT:  Goal assessment: -Pt stood modA in // bars w/ posterior hip support x5 minutes (roughly 5 minutes and 4 seconds) prior to fatigue.  Her upper body positioning appeared more stable this stand.  She was better able to correct her pelvic forward for improved postural  alignment w/ min cuing. -Bump transfer on slideboard w/c > mat table minA for LE management w/ modA to obtain long-sitting from EOM w/ pt initially propping herself posteriorly > modA for trunk support into long-sitting for squigzs placement and removal crossbody on slideboard > cone moving to edge of BOS -Supine PROM and gentle stretching to BLE -Bump transfer mat > wheelchair minA for LE management, pt and nurse performed adjustments in chair as needed  PATIENT EDUCATION: Education details: Continue stretching program and UB  strengthening exercises, see self-care for further. Person educated: Patient and Arts administrator Education method: Explanation, Demonstration, Tactile cues, and Verbal cues Education comprehension: verbalized understanding and returned demonstration  HOME EXERCISE PROGRAM: Access Code: [email or web address] URL: [email or web address] Date: 11/22/2023 Prepared by: Waddell Southgate  Exercises - Seated Elbow Extension with Self-Anchored Resistance  - 1 x daily - 7 x weekly - 3 sets - 10 reps - Supine Elbow Flexion Extension with Dumbbell  - 1 x daily - 7 x weekly - 3 sets - 10 reps - Seated Single Arm Elbow Extension Push-up on Table  - 1 x daily - 7 x weekly - 3 sets - 10 reps - Wheelchair Push-Up (AKA)  - 1 x daily - 7 x weekly - 3 sets - 5 reps - Seated Biceps Curl  - 1 x daily - 7 x weekly - 3 sets - 10 reps - Forearm Pronation and Supination with Hammer  - 1 x daily - 7 x weekly - 3 sets - 10 reps - Seated Wrist Extension with Dumbbell  - 1 x daily - 7 x weekly - 3 sets - 10 reps  GOALS: Goals reviewed with patient? Yes  SHORT TERM GOALS: Target date: 10/07/2023  HEP to be established for stretching and strengthening as appropriate. Baseline: not established at initial eval, reviewed during PT POC - pt and [name] report it is going well at home (4/25) Goal status: MET  2.  Pt will perform sit to stand transfer with LRAD with mod A Baseline: max A to stedy (4/4), max A in // bars (4/25) Goal status: IN PROGRESS  3.  Pt to tolerate standing x 5 min with LRAD to demonstrate improved endurance Baseline: 30 sec in stedy (initial), 2:35 min in // bars (4/25) Goal status: IN PROGRESS  4.  Pt to demonstrate reduced edema in BLE with a reduction in circumference/figure 8 measurement by 0.5 from initial evaluation. Baseline: Circumferential: R knee: 17; L knee: 17.5 and Figure 8: R ankle 21, L ankle 21.5 4/25: R knee 16, L knee: 16 11/16, R ankle: 21, L ankle 20 Goal status: IN  PROGRESS   LONG TERM GOALS: Target date: 11/04/2023  Patient and her caregiver to be independent with performance of HEP for stretching and strengthening as appropriate. Baseline: not established at initial eval, reviewed during PT POC - pt and [name] report it is going well at home (4/25) Goal status: MET  2.  Pt will perform sit to stand transfer with LRAD with min A Baseline: max A to stedy (4/4), max A in // bars (4/25), max A in // bars (5/22) Goal status: NOT MET  3.  Pt to tolerate standing x 5 min with LRAD to demonstrate improved endurance Baseline: 30 sec in stedy (initial), 2:35 min in // bars (4/25), 3 min in // bars (5/22) Goal status: NOT MET  4.  Pt to demonstrate reduced edema in BLE with a reduction in circumference/figure 8 measurement by 1 from initial evaluation.  Baseline: Circumferential:  R knee: 17; L knee: 17.5 and Figure 8: R ankle 21, L ankle 21.5 4/25: R knee 16, L knee: 16 11/16, R ankle: 21, L ankle 20 5/30: R knee: 16, L knee: 16, R ankle: 20.5, L ankle: 20 Goal status: PARTIALLY MET   NEW SHORT TERM GOALS:   Target date: 12/04/2023  Pt will perform sit to stand transfer with LRAD with mod A Baseline: max A to stedy (4/4), max A in // bars (4/25), max A in // bars (5/22), max A to +2 in // bars (6/18) Goal status: NOT MET  2.  Pt to tolerate standing x 4 min with LRAD to demonstrate improved endurance Baseline: 30 sec in stedy (initial), 2:35 min in // bars (4/25), 3 min in // bars (5/22), 4 min in // bars (6/18) Goal status: MET  3.  Pt will progress to performing bump transfers with mod A at the most for increased independence with functional mobility Baseline: up to max A (5/30), min A (6/18) Goal status: MET   NEW LONG TERM GOALS:  Target date: 12/26/2023  Pt will perform sit to stand transfer with LRAD with min A Baseline: max A to stedy (4/4), max A in // bars (4/25), max A in // bars (5/22), max A to +2 in // bars (6/18); modA  (7/9) Goal status: IN PROGRESS  2.  Pt to tolerate standing x 5 min with LRAD to demonstrate improved endurance Baseline: 30 sec in stedy (initial), 2:35 min in // bars (4/25), 3 min in // bars (5/22), 4 min in // bars (6/18); 5 minutes 4 seconds in // bars (7/9) Goal status: MET  3.  Pt will progress to performing bump transfers with min A at the most for increased independence with functional mobility Baseline: up to max A (5/30), min A (6/18) Goal status: MET  4.  Pt will be able to maintain dynamic sitting balance in long-sit position x 5 min while performing a functional task with no more than mod A Baseline: max A (5/30); 6 minutes 26 seconds SBA-CGA (7/14) Goal status: MET  GOALS (at 7/9 re-cert): Goals reviewed with patient? Yes  SHORT TERM GOALS:   Target date: 01/20/2024  Pt will perform rolling left and right from supine at no more than minA in order to demonstrate progression back to PLOF. Baseline: mod-maxA over recent 2 weeks (7/9) Goal status: INITIAL  2.  Pt will be able to reach outside of short sitting BOS and recover at no more than mod I level to demonstrate functional core strengthening. Baseline: min-modA (7/9) Goal status: INITIAL  LONG TERM GOALS:  Target date: 02/17/2024  Pt will perform sit to stand transfer with LRAD with min A Baseline: max A to stedy (4/4), max A in // bars (4/25), max A in // bars (5/22), max A to +2 in // bars (6/18); modA (7/9) Goal status: ONGOING  2.  Pt to tolerate standing x 6 min using upright posture with LRAD to demonstrate improved endurance. Baseline: 30 sec in stedy (initial), 2:35 min in // bars (4/25), 3 min in // bars (5/22), 4 min in // bars (6/18); 5 minutes 4 seconds in // bars (7/9) Goal status: ONGOING  3.  Pt will be able to obtain static long sitting position with no more than minA in order to promote improved functional positioning at home. Baseline: modA for trunk and LE support into forward positioning  (7/9) Goal status: INITIAL  ASSESSMENT:  CLINICAL IMPRESSION: Re-cert completed today with pt meeting 3 out of 4 goals as written.  Her dynamic long sitting tolerance appears improved with less physical support needed to maintain.  She is able to stand with improved self-correction and BUE use this visit compared to recent prior attempts.  Her bump transfers on the sliding board vary, but overall the trend has been towards minA recently.  Although her rolling appears to be improving again she has had a recent setback in functional performance so PT will continue to revisit prior functional training to ensure patient continues trend forward.  She currently is managing a UTI so PT will monitor physical improvements as this resolves.  Will continue per POC.  OBJECTIVE IMPAIRMENTS: decreased balance, decreased endurance, decreased mobility, difficulty walking, decreased ROM, decreased strength, increased edema, impaired perceived functional ability, increased muscle spasms, impaired flexibility, impaired sensation, impaired tone, impaired UE functional use, postural dysfunction, and pain.   ACTIVITY LIMITATIONS: carrying, lifting, bending, standing, stairs, transfers, bed mobility, continence, bathing, toileting, dressing, reach over head, and hygiene/grooming  PARTICIPATION LIMITATIONS: meal prep, cleaning, laundry, driving, shopping, community activity, and occupation  PERSONAL FACTORS: Age, Sex, Time since onset of injury/illness/exacerbation, and 1-2 comorbidities:   C7 ASIA C- incomplete quad w/ neurogenic bladder and bowel, HLD, Hx of skin cancerare also affecting patient's functional outcome.   REHAB POTENTIAL: Good  CLINICAL DECISION MAKING: Stable/uncomplicated  EVALUATION COMPLEXITY: High  PLAN:  PT FREQUENCY: 2x/week  PT DURATION: 8 weeks + 8 wks  PLANNED INTERVENTIONS: 02835- PT Re-evaluation, 97750- Physical Performance Testing, 97110-Therapeutic exercises, 97530- Therapeutic  activity, 97112- Neuromuscular re-education, 97535- Self Care, 02859- Manual therapy, [phone]- Gait training, [phone]- Orthotic Initial, [phone]- Electrical stimulation (manual), [phone] (1-2 muscles), [phone] (3+ muscles)- Dry Needling, Patient/Family education, Balance training, Stair training, Taping, Joint mobilization, Scar mobilization, Compression bandaging, DME instructions, Wheelchair mobility training, Cryotherapy, and Moist heat  PLAN FOR NEXT SESSION: sit to stands, standing tolerance with stedy, in // bars, possibly with RW, core strengthening/stability, endurance; wants to have conversation about taking a break (3-6 months) after this POC near end of this POC, standing lateral weight shifts, mini-squats in standing?, Yoga block press-ups vs push-up blocks, long-sitting - unsupported for core work?, bump transfers with and without slide board - pt wants to work on this skill!, lateral leans, work on mini squats in standing frame, prone UB strengthening, rolling L/R on mat and breaking down parts of rolling    Daved KATHEE Bull, PT, DPT  12/21/2023, 12:47 PM

## 2023-12-22 ENCOUNTER — Ambulatory Visit: Payer: Self-pay | Admitting: Family Medicine

## 2023-12-22 DIAGNOSIS — L814 Other melanin hyperpigmentation: Secondary | ICD-10-CM | POA: Diagnosis not present

## 2023-12-22 DIAGNOSIS — L905 Scar conditions and fibrosis of skin: Secondary | ICD-10-CM | POA: Diagnosis not present

## 2023-12-22 DIAGNOSIS — L570 Actinic keratosis: Secondary | ICD-10-CM | POA: Diagnosis not present

## 2023-12-22 DIAGNOSIS — D239 Other benign neoplasm of skin, unspecified: Secondary | ICD-10-CM | POA: Diagnosis not present

## 2023-12-22 NOTE — Telephone Encounter (Signed)
 Contacted pt's caregiver-Janet, yesterday and inform her the form is ready for a pick up. Pt's caregiver verbalized understanding.

## 2023-12-23 DIAGNOSIS — H353112 Nonexudative age-related macular degeneration, right eye, intermediate dry stage: Secondary | ICD-10-CM | POA: Diagnosis not present

## 2023-12-26 ENCOUNTER — Telehealth: Payer: Self-pay | Admitting: "Endocrinology

## 2023-12-26 ENCOUNTER — Ambulatory Visit: Admitting: Occupational Therapy

## 2023-12-26 ENCOUNTER — Other Ambulatory Visit: Payer: Self-pay

## 2023-12-26 ENCOUNTER — Other Ambulatory Visit: Payer: Self-pay | Admitting: "Endocrinology

## 2023-12-26 ENCOUNTER — Ambulatory Visit: Admitting: Infectious Diseases

## 2023-12-26 ENCOUNTER — Encounter: Payer: Self-pay | Admitting: Infectious Diseases

## 2023-12-26 ENCOUNTER — Ambulatory Visit: Admitting: Physical Therapy

## 2023-12-26 VITALS — BP 107/71 | HR 72 | Temp 98.0°F

## 2023-12-26 DIAGNOSIS — M24542 Contracture, left hand: Secondary | ICD-10-CM | POA: Diagnosis not present

## 2023-12-26 DIAGNOSIS — M25642 Stiffness of left hand, not elsewhere classified: Secondary | ICD-10-CM | POA: Diagnosis not present

## 2023-12-26 DIAGNOSIS — N390 Urinary tract infection, site not specified: Secondary | ICD-10-CM | POA: Diagnosis not present

## 2023-12-26 DIAGNOSIS — R278 Other lack of coordination: Secondary | ICD-10-CM | POA: Diagnosis not present

## 2023-12-26 DIAGNOSIS — Z79899 Other long term (current) drug therapy: Secondary | ICD-10-CM | POA: Diagnosis not present

## 2023-12-26 DIAGNOSIS — M6281 Muscle weakness (generalized): Secondary | ICD-10-CM | POA: Diagnosis not present

## 2023-12-26 DIAGNOSIS — R319 Hematuria, unspecified: Secondary | ICD-10-CM

## 2023-12-26 DIAGNOSIS — M25641 Stiffness of right hand, not elsewhere classified: Secondary | ICD-10-CM | POA: Diagnosis not present

## 2023-12-26 DIAGNOSIS — R293 Abnormal posture: Secondary | ICD-10-CM | POA: Diagnosis not present

## 2023-12-26 DIAGNOSIS — Z9359 Other cystostomy status: Secondary | ICD-10-CM

## 2023-12-26 DIAGNOSIS — G8254 Quadriplegia, C5-C7 incomplete: Secondary | ICD-10-CM

## 2023-12-26 DIAGNOSIS — R29818 Other symptoms and signs involving the nervous system: Secondary | ICD-10-CM | POA: Diagnosis not present

## 2023-12-26 DIAGNOSIS — R29898 Other symptoms and signs involving the musculoskeletal system: Secondary | ICD-10-CM | POA: Diagnosis not present

## 2023-12-26 MED ORDER — TERIPARATIDE 560 MCG/2.24ML ~~LOC~~ SOPN: 20.0000 ug | PEN_INJECTOR | Freq: Every day | SUBCUTANEOUS | 0 refills | Status: DC

## 2023-12-26 MED ORDER — METHENAMINE HIPPURATE 1 G PO TABS: 1.0000 g | ORAL_TABLET | Freq: Two times a day (BID) | ORAL | 5 refills | Status: AC

## 2023-12-26 NOTE — Patient Instructions (Signed)
 DC macrobid  Continue methenamine  and Vit C as is  Fu in 3 months, check liver enzymes Fu with Urology

## 2023-12-26 NOTE — Therapy (Signed)
 OUTPATIENT PHYSICAL THERAPY NEURO TREATMENT   Patient Name: [name] MRN: [id number] DOB:03-27-1951, 73 y.o., female Today's Date: 12/26/2023   PCP: [name], MD REFERRING PROVIDER: [name], MD   END OF SESSION:   PT End of Session - 12/26/23 1019     Visit Number 30    Number of Visits 45   29 + 16 at re-cert 7/9   Date for PT Re-Evaluation 03/02/24   to allow for scheduling delays/known upcoming pt conflicts   Authorization Type [employer] MEDICARE    PT Start Time 1015    PT Stop Time 1100    PT Time Calculation (min) 45 min    Equipment Utilized During Treatment Gait belt    Activity Tolerance Patient tolerated treatment well    Behavior During Therapy [facility] for tasks assessed/performed          Past Medical History:  Diagnosis Date   Arthritis 2019   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 Aneita   Fibroid 2004   Per Dr. Lenon   History of shingles    face and mouth   Hx of skin cancer, basal cell    Hyperlipidemia 2022   lower legs and feet   Rosacea    Sciatica of left side 09/28/2013   Scoliosis    noted on mri done for back pain   Past Surgical History:  Procedure Laterality Date   BUNIONECTOMY     SPINE SURGERY  07/28/20   fused C5-C6   Patient Active Problem List   Diagnosis Date Noted   Buttock wound, left, subsequent encounter 03/16/2023   Bronchiectasis with acute exacerbation (HCC) 03/15/2023   Buttock wound, left, initial encounter 11/02/2022   Orthostatic hypotension 08/13/2022   Neurogenic bowel 05/03/2022   Spasticity 05/03/2022   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/03/2022   Nerve pain 05/03/2022   Medication monitoring encounter 01/08/2022   Neurogenic bladder 10/11/2021   Urinary incontinence 10/11/2021   ESBL (extended spectrum beta-lactamase) producing bacteria infection 10/09/2021   Recurrent UTI 10/09/2021   Quadriplegia,  C5-C7 incomplete (HCC) 01/16/2021   History of spinal fracture 01/16/2021   Suprapubic catheter (HCC) 01/16/2021   Encounter for routine gynecological examination 09/28/2013   Onychomycosis 09/28/2013   Foot deformity, acquired 03/26/2012   Encounter for preventive health examination 12/25/2010   ROSACEA 08/25/2009   Disturbance in sleep behavior 03/11/2008   SKIN CANCER, HX OF 03/11/2008   DYSURIA, HX OF 03/11/2008   Hyperlipidemia 02/10/2007   CERVICALGIA 02/10/2007    ONSET DATE: 08/29/2023 (referral date)  REFERRING DIAG: G82.54 (ICD-10-CM) - Incomplete quadriplegia at C5-C8 level (HCC)  THERAPY DIAG:  Muscle weakness (generalized)  Abnormal posture  Quadriplegia, C5-C7 incomplete (HCC)  Rationale for Evaluation and Treatment: Rehabilitation  SUBJECTIVE:  SUBJECTIVE STATEMENT:  Pt unsure if she has a UTI or anemia as her lab work came back normal. She reports that her energy levels are slightly improved as compared to previous session. Pt does need to have her PWC seat adjusted by Penne at [employer].    From initial eval: Pt familiar to this clinic, last seen Oct-Nov 2024 but has been seen for multiple POCs since her initial injury in 2022. Pt returns to this clinic after being in [place]  for the past few months. While in [place]  in December 2024 patient had a fall where she slid forwards out of her wheelchair onto the floor, ended up fracturing both of her femurs. Pt was hospitalized for 10-11 days and had surgical repair of her femurs. Pt reports she has been cleared of all restrictions since surgery, does have ongoing swelling in both legs and worsened spasticity. Pt reports that the spasticity has slightly improved since it initially started, was told by Dr. Lovorn the swelling may not  resolve for 6-8 months (it has been 3 months) and not sure if spasticity will resolve but patient is hopeful that if her swelling improves her spasticity will improve as well.  Pt asking about other options to help manage the swelling her legs, has tried variable compression stockings (knee high) and her PTs in [place]  recommended tight shapewear. Pt reports that the knee-high compression stockings led to increased swelling in her knees and the shapewear did not help her swelling. Encouraged patient to get thigh-high compression stockings and elevate her LE in PWC. Pt has ordered an articulating bed that will get here next Monday (3/31) so she can better elevate her legs when in bed.  Pt is also concerned about decreased ROM in her legs, [name] but she feels she has more motion in her LLE as compared to RLE and may have mild foot drop on her R side. Pt does have PRAFOs to wear at night. Pt has worked up to standing in her standing frame x 30-40 min at a time. Pt also worked on standing with her PT and in // bars in [place] . Pt also reports with her injuries she lost the ability to lock/unlock her R knee but that it is getting better. She reports she lost a lot of stamina during her hospital stay as well.  Pt also has a new wound since last seen in this clinic in her L gluteal fold, shearing injury. Pt was seeing wound care in [place]  and is scheduled to see wound care with [facility] early April (was not able to schedule with [facility] wound care until late April).  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 Yes: NPRS scale: 3-4 Pain location: elbows to fingertips on both arms Pain description: nerve pain Aggravating factors: not stated Relieving factors: not stated  PRECAUTIONS: Fall and Other: osteoporosis  RED FLAGS: None   WEIGHT  BEARING RESTRICTIONS: No  FALLS: Has patient fallen in last 6 months? Yes. Number of falls 1 fall in [place]  that resulted in B femur fractures  LIVING ENVIRONMENT: Lives with: lives with their spouse and and with full-time caregiver [name] Lives in: House/apartment Home is power wheelchair accessible Has following equipment at home: Wheelchair (power), Wheelchair (manual), Grab bars, Ramped entry, and standing frame, slide board  PLOF: Independent with household mobility with device,  Independent with community mobility with device, Requires assistive device for independence, Needs assistance with ADLs, and Needs assistance with transfers  PATIENT GOALS: still working on stand and pivot with goal to pivot to commode or to a chair work on core-will help me with standing improve my stamina - being in the hospital I lost strength/endurance   OBJECTIVE:  Note: Objective measures were completed at Evaluation unless otherwise noted.  DIAGNOSTIC FINDINGS: None update/relevant to this POC  COGNITION: Overall cognitive status: Within functional limits for tasks assessed   SENSATION: Decreased sensation in BUE and BLE secondary to incomplete quadriplegia Decreased sensation in proximal LLE as compared to distal LE  EDEMA:  Circumferential: R knee: 17; L knee: 17.5 and Figure 8: R ankle 21, L ankle 21.5  MUSCLE TONE: increased spasticity in BLE   POSTURE: rounded shoulders and forward head  LOWER EXTREMITY ROM:     Passive  Right Eval Left Eval  Hip flexion Tight hip flexors Tight hip flexors  Hip extension    Hip abduction    Hip adduction    Hip internal rotation    Hip external rotation    Knee flexion Tight HS Tight HS  Knee extension    Ankle dorsiflexion Decreased, tight gastroc Decreased, tight gastroc  Ankle plantarflexion    Ankle inversion    Ankle eversion     (Blank rows = not tested)  LOWER EXTREMITY MMT:    MMT Right Eval Left Eval  Hip flexion 1  2-  Hip extension    Hip abduction    Hip adduction    Hip internal rotation    Hip external rotation    Knee flexion 0 3  Knee extension 2- 2-  Ankle dorsiflexion 2- 3  Ankle plantarflexion    Ankle inversion    Ankle eversion    (Blank rows = not tested)  BED MOBILITY:  From previous POC: Sit to supine Mod A Supine to sit Mod A Rolling to Right Mod A Rolling to Left Mod A Undulating mattress for wound management on standard bed (elevated-so often doing uphill sliding board transfers); she would like to continue working on sitting up independently, she has been working on rolling, needs less assistance w/ this when someone props her leg into hooklying; would like something to help her pull her left leg to her butt for stretching as well as bed mobility.  TRANSFERS: From previous POC: Pt continues using combination of bump over, slide board, and depression (squat pivot) transfers.                                                                                                                              TREATMENT:    NMR Pt received seated in her PWC. Slide board transfer PWC to mat table with min A. Session focus on short-sitting balance EOM with intermittent UE support for balance, close SBA: Seated reaching outside BOS to ipsilateral side for cones 2  x 6 reps Pt does have to brace herself with her R elbow on mat table to reach Seated reaching outside BOS and across midline to contralateral side for cones 2 x 6 reps Pt does have to brace herself B with elbows on mat table when leaning to each side Seated forwards reaching outside BOS and across midline for cones 2 x 12 reps Pt does have to brace herself with UE on mat table or on her thighs for balance and to sit back upright, able to sit upright x 1 rep without UE support Slide board transfer back to [facility] with min A at end of session. Pt left seated in her PWC with [name] to assist with repositioning.   PATIENT  EDUCATION: Education details: Continue stretching program and UB strengthening exercises Person educated: Patient and Arts administrator Education method: Explanation, Demonstration, Tactile cues, and Verbal cues Education comprehension: verbalized understanding and returned demonstration  HOME EXERCISE PROGRAM: Access Code: [email or web address] URL: [email or web address] Date: 11/22/2023 Prepared by: Waddell Southgate  Exercises - Seated Elbow Extension with Self-Anchored Resistance  - 1 x daily - 7 x weekly - 3 sets - 10 reps - Supine Elbow Flexion Extension with Dumbbell  - 1 x daily - 7 x weekly - 3 sets - 10 reps - Seated Single Arm Elbow Extension Push-up on Table  - 1 x daily - 7 x weekly - 3 sets - 10 reps - Wheelchair Push-Up (AKA)  - 1 x daily - 7 x weekly - 3 sets - 5 reps - Seated Biceps Curl  - 1 x daily - 7 x weekly - 3 sets - 10 reps - Forearm Pronation and Supination with Hammer  - 1 x daily - 7 x weekly - 3 sets - 10 reps - Seated Wrist Extension with Dumbbell  - 1 x daily - 7 x weekly - 3 sets - 10 reps  GOALS: Goals reviewed with patient? Yes  GOALS (at 7/9 re-cert): Goals reviewed with patient? Yes  SHORT TERM GOALS:   Target date: 01/20/2024  Pt will perform rolling left and right from supine at no more than minA in order to demonstrate progression back to PLOF. Baseline: mod-maxA over recent 2 weeks (7/9) Goal status: INITIAL  2.  Pt will be able to reach outside of short sitting BOS and recover at no more than mod I level to demonstrate functional core strengthening. Baseline: min-modA (7/9) Goal status: INITIAL  LONG TERM GOALS:  Target date: 02/17/2024  Pt will perform sit to stand transfer with LRAD with min A Baseline: max A to stedy (4/4), max A in // bars (4/25), max A in // bars (5/22), max A to +2 in // bars (6/18); modA (7/9) Goal status: ONGOING  2.  Pt to tolerate standing x 6 min using upright posture with LRAD to demonstrate improved  endurance. Baseline: 30 sec in stedy (initial), 2:35 min in // bars (4/25), 3 min in // bars (5/22), 4 min in // bars (6/18); 5 minutes 4 seconds in // bars (7/9) Goal status: ONGOING  3.  Pt will be able to obtain static long sitting position with no more than minA in order to promote improved functional positioning at home. Baseline: modA for trunk and LE support into forward positioning (7/9) Goal status: INITIAL  ASSESSMENT:  CLINICAL IMPRESSION: Emphasis of skilled PT session on working on core strengthening and stability and sitting balance in short-sit position on mat table. Pt with more difficulty balancing herself when reaching to  her R side as compared to her L side due to R hemibody weakness as compared L hemibody. She does rely on UE support to return herself to upright sitting due to impaired core control, to be expected based on her level of injury. She continues to benefit from skilled PT services to work on functional strengthening and increasing her safety and independence with functional mobility. Continue POC.   OBJECTIVE IMPAIRMENTS: decreased balance, decreased endurance, decreased mobility, difficulty walking, decreased ROM, decreased strength, increased edema, impaired perceived functional ability, increased muscle spasms, impaired flexibility, impaired sensation, impaired tone, impaired UE functional use, postural dysfunction, and pain.   ACTIVITY LIMITATIONS: carrying, lifting, bending, standing, stairs, transfers, bed mobility, continence, bathing, toileting, dressing, reach over head, and hygiene/grooming  PARTICIPATION LIMITATIONS: meal prep, cleaning, laundry, driving, shopping, community activity, and occupation  PERSONAL FACTORS: Age, Sex, Time since onset of injury/illness/exacerbation, and 1-2 comorbidities:   C7 ASIA C- incomplete quad w/ neurogenic bladder and bowel, HLD, Hx of skin cancerare also affecting patient's functional outcome.   REHAB POTENTIAL:  Good  CLINICAL DECISION MAKING: Stable/uncomplicated  EVALUATION COMPLEXITY: High  PLAN:  PT FREQUENCY: 2x/week  PT DURATION: 8 weeks + 8 wks  PLANNED INTERVENTIONS: 02835- PT Re-evaluation, 97750- Physical Performance Testing, 97110-Therapeutic exercises, 97530- Therapeutic activity, 97112- Neuromuscular re-education, [phone]- Self Care, 02859- Manual therapy, [phone]- Gait training, [phone]- Orthotic Initial, [phone]- Electrical stimulation (manual), [phone] (1-2 muscles), [phone] (3+ muscles)- Dry Needling, Patient/Family education, Balance training, Stair training, Taping, Joint mobilization, Scar mobilization, Compression bandaging, DME instructions, Wheelchair mobility training, Cryotherapy, and Moist heat  PLAN FOR NEXT SESSION: sit to stands, standing tolerance with stedy, in // bars, possibly with RW, core strengthening/stability, endurance; wants to have conversation about taking a break (3-6 months) after this POC near end of this POC, standing lateral weight shifts, mini-squats in standing?, Yoga block press-ups vs push-up blocks, long-sitting - unsupported for core work?, bump transfers with and without slide board - pt wants to work on this skill!, lateral leans, work on mini squats in standing frame, prone UB strengthening, rolling L/R on mat and breaking down parts of rolling    Waddell Southgate, PT Waddell Southgate, PT, DPT, CSRS   12/26/2023, 11:01 AM

## 2023-12-26 NOTE — Telephone Encounter (Addendum)
 MEDICATION: Teriparatide  (FORTEO ) 600 MCG/2.4ML SOPN   PHARMACY:   [name] [place] - [address] [place], [place] [address] Phone: [phone]  Fax: [phone]    HAS THE PATIENT CONTACTED THEIR PHARMACY?  Yes  IS THIS A 90 DAY SUPPLY : Yes  IS PATIENT OUT OF MEDICATION: Almost  IF NOT; HOW MUCH IS LEFT: Not sure  LAST APPOINTMENT DATE: @10 /07/2022  NEXT APPOINTMENT DATE:@8 /10/2023  DO WE HAVE YOUR PERMISSION TO LEAVE A DETAILED MESSAGE?:  OTHER COMMENTS: Patient states that pharmacy is telling her that they have not received the prescription.................................................   **Let patient know to contact pharmacy at the end of the day to make sure medication is ready. **  ** Please notify patient to allow 48-72 hours to process**  **Encourage patient to contact the pharmacy for refills or they can request refills through [facility]**
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
## 2023-12-26 NOTE — Progress Notes (Unsigned)
 Patient Active Problem List   Diagnosis Date Noted   Buttock wound, left, subsequent encounter 03/16/2023   Bronchiectasis with acute exacerbation (HCC) 03/15/2023   Buttock wound, left, initial encounter 11/02/2022   Orthostatic hypotension 08/13/2022   Neurogenic bowel 05/03/2022   Spasticity 05/03/2022   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/03/2022   Nerve pain 05/03/2022   Medication monitoring encounter 01/08/2022   Neurogenic bladder 10/11/2021   Urinary incontinence 10/11/2021   ESBL (extended spectrum beta-lactamase) producing bacteria infection 10/09/2021   Recurrent UTI 10/09/2021   Quadriplegia, C5-C7 incomplete (HCC) 01/16/2021   History of spinal fracture 01/16/2021   Suprapubic catheter (HCC) 01/16/2021   Encounter for routine gynecological examination 09/28/2013   Onychomycosis 09/28/2013   Foot deformity, acquired 03/26/2012   Encounter for preventive health examination 12/25/2010   ROSACEA 08/25/2009   Disturbance in sleep behavior 03/11/2008   SKIN CANCER, HX OF 03/11/2008   DYSURIA, HX OF 03/11/2008   Hyperlipidemia 02/10/2007   CERVICALGIA 02/10/2007    Patient's Medications  New Prescriptions   No medications on file  Previous Medications   ACETAMINOPHEN (TYLENOL) 500 MG TABLET    Take 500 mg by mouth every 6 (six) hours as needed.   ASCORBIC ACID  (VITAMIN C) 1000 MG TABLET    Take 1 tablet (1,000 mg total) by mouth in the morning, at noon, in the evening, and at bedtime.   ATORVASTATIN  (LIPITOR) 20 MG TABLET    TAKE 1 TABLET BY MOUTH EVERY DAY   BACLOFEN  (LIORESAL ) 20 MG TABLET    Increasing baclofen  to 40 mg 4x/day- after leg fractures- for spasticity-   BISACODYL (DULCOLAX) 10 MG SUPPOSITORY    Place 10 mg rectally as needed for moderate constipation. Insert one suppository per rectum with each bowel program procedure.   CHOLECALCIFEROL (VITAMIN D3) 25 MCG (1000 UNIT) TABLET    Take 1,000 Units by mouth daily. 2000u   [employer] COENZYME Q-10 100 MG CAPSULE    Take  100 mg by mouth daily.    DOCUSATE SODIUM (DSS) 100 MG CAPS    Take by mouth.   FAMOTIDINE (PEPCID) 20 MG TABLET    Take 20 mg by mouth 2 (two) times daily.   FESOTERODINE  (TOVIAZ ) 8 MG TB24 TABLET    Take 1 tablet (8 mg total) by mouth daily.   GABAPENTIN  (NEURONTIN ) 600 MG TABLET    TAKE 2 TABLETS (1,200 MG TOTAL) BY MOUTH 3 (THREE) TIMES DAILY.   METHENAMINE  (HIPREX ) 1 G TABLET    Take 1 tablet (1 g total) by mouth 2 (two) times daily with a meal.   METRONIDAZOLE  (METROGEL ) 1 % GEL    APPLY TOPICALLY EVERY DAY   MULTIPLE VITAMINS-MINERALS (CENTRUM SILVER ULTRA WOMENS PO)    Take by mouth.   MUPIROCIN  OINTMENT (BACTROBAN ) 2 %    Apply 1 Application topically 2 (two) times daily.   MYRBETRIQ 50 MG TB24 TABLET    TAKE 1 TABLET BY MOUTH EVERY DAY   NAPROXEN SODIUM (ALEVE) 220 MG TABLET    Take 220 mg by mouth. Tablet p.o per package directions as needed for pain   NITROFURANTOIN , MACROCRYSTAL-MONOHYDRATE, (MACROBID ) 100 MG CAPSULE    Take 1 capsule (100 mg total) by mouth 2 (two) times daily.   NORTRIPTYLINE  (PAMELOR ) 25 MG CAPSULE    Take 1 capsule (25 mg total) by mouth at bedtime.   NORTRIPTYLINE  (PAMELOR ) 50 MG CAPSULE    TAKE 1 CAPSULE BY MOUTH AT BEDTIME.   SENNA CO    by  Combination route. Sennosides 8.6mg  tab p.o per package directions daily as needed to promote bowel movement.   TERIPARATIDE  (FORTEO ) 600 MCG/2.4ML SOPN    Inject 20 mcg into the skin daily in the afternoon.   TIZANIDINE  (ZANAFLEX ) 4 MG TABLET    Take 0.5-1 tablets (2-4 mg total) by mouth 2 (two) times daily as needed for muscle spasms. To take with baclofen - for increasing spasticity in SCI patient- needs BOTH meds-   UNABLE TO FIND    Med Name: Saline Enema Per rectum per package directions as needed for relief of constipation.   ZOLPIDEM  (AMBIEN ) 5 MG TABLET    Take 0.5 tablets (2.5 mg total) by mouth at bedtime as needed for sleep.  Modified Medications   No medications on file  Discontinued Medications   No  medications on file    Subjective: 73 YO Female with h/o Incomplete quadriplegia with neurogenic bladder s/p SPC with urinary incontinence who is here for fu in the setting of recurrent UTI. 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 Denies any UTIs so far. She has an aide who helps with [facility] exchange and follows up with Urology as needed.   03/15/23 Accompanied by ger caretaker. Admitted 9/22 -9/25 for CAP/Bronchiectasis. Received Iv meropenem/azithromycin  in the hospital which was switched to PO augmentin  on discharge to complete 7 days. Last day of po augmentin  is tomorrow. Shortness of breath and chest pain has improved but still has some dry cough. She feels significantly better. Denies fevers, chills. Not taking methenamine  due to being on abtx since hospitalized and plan to resume tomorrow evening, She is taking Vit c however. No UTIs since last seen. Went to [facility] recently for inpatient intensive PT and vefy pleased with the therapy. No refills needed and she knows to call clinic in case she runs out of pills. Reports her left buttock wound has healed/closed.   7/14 She is accompanied by her aide. She had urine cx done on 4/7 growing MRSE, reports she does not remember taking antibiotics that time although macrobid  prescribed.  Most recently, she had urine cx done on 7/8- NG. She is on Macrobid  for six to seven days with one to two days remaining. She had symptoms of urgency and leakage when urine checked that have improved since starting the antibiotic.SABRA She has a suprapubic catheter changed every four weeks. She had blood in urine since December and has an upcoming appt with Urology in mid August. She has been consistently taking methenamine  and Vit C without any concerns.   She has been closely following with wound care and wound in left buttock had closed per last visit with wound care on 6/26.    Going to [place] in 10 days. No other complaints.   Review of Systems: all systems reviewed with pertinent positives and negatives as listed above.    Past Medical History:  Diagnosis Date   Arthritis 2019   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 Aneita   Fibroid 2004   Per Dr. Lenon   History of shingles    face and mouth   Hx of skin cancer, basal cell    Hyperlipidemia 2022   lower legs and feet   Rosacea    Sciatica of left side 09/28/2013   Scoliosis  noted on mri done for back pain   Past Surgical History:  Procedure Laterality Date   BUNIONECTOMY     SPINE SURGERY  07/28/20   fused C5-C6    Social History   Tobacco Use   Smoking status: Never   Smokeless tobacco: Never  Vaping Use   Vaping status: Never Used  Substance Use Topics   Alcohol use: Not Currently    Alcohol/week: 7.0 standard drinks of alcohol    Types: 7 Glasses of wine per week   Drug use: Yes    Comment: wine at night    Family History  Problem Relation Age of Onset   Arthritis Mother    Hypertension Father    Early death Father    Osteoporosis Other    Early death Sister    Hypertension Sister    Early death Brother    Breast cancer Neg Hx     No Known Allergies  Health Maintenance  Topic Date Due   Medicare Annual Wellness (AWV)  03/03/2023   COVID-19 Vaccine (12 - Moderna risk 2024-25 season) 10/16/2023   INFLUENZA VACCINE  01/13/2024   Fecal DNA (Cologuard)  10/11/2024   MAMMOGRAM  11/16/2024   DTaP/Tdap/Td (3 - Td or Tdap) 04/17/2032   Pneumococcal Vaccine: 50+ Years  Completed   DEXA SCAN  Completed   Hepatitis C Screening  Completed   Zoster Vaccines- Shingrix  Completed   Hepatitis B Vaccines  Aged Out   HPV VACCINES  Aged Out   Meningococcal B Vaccine  Aged Out   Colonoscopy  Discontinued    Objective: BP 107/71   Pulse 72   Temp 98 F (36.7 C) (Temporal)    SpO2 94%   Physical Exam Constitutional:      Appearance: Normal appearance.  HENT:     Head: Normocephalic and atraumatic.      Mouth: Mucous membranes are moist.  Eyes:    Conjunctiva/sclera: Conjunctivae normal.     Pupils:  Cardiovascular:     Rate and Rhythm: Normal rate and regular rhythm.     Heart sounds:  Pulmonary:     Effort: Pulmonary effort is normal on RA    Breath sounds:   Abdominal:     General: Non distended     Palpations: soft. SPC with a urobag - OK with no concerns   Musculoskeletal:        General: sitting in a wheel chair   Skin:    General: Skin is warm and dry.     Comments:  Neurological:     General: quadriplegia     Mental Status: awake, alert and oriented to person, place, and time.   Psychiatric:        Mood and Affect: Mood normal.   Lab Results Lab Results  Component Value Date   WBC 4.4 12/20/2023   HGB 12.7 12/20/2023   HCT 36.8 12/20/2023   MCV 91.6 12/20/2023   PLT 179.0 12/20/2023    Lab Results  Component Value Date   CREATININE 0.36 (L) 09/14/2023   BUN 17 09/14/2023   NA 138 09/14/2023   K 4.3 09/14/2023   CL 98 09/14/2023   CO2 33 (H) 09/14/2023    Lab Results  Component Value Date   ALT 40 (H) 12/20/2023   AST 38 (H) 12/20/2023   ALKPHOS 80 12/20/2023   BILITOT 0.5 12/20/2023    Lab Results  Component Value Date   CHOL 136 10/14/2022   HDL 53.80 10/14/2022  LDLCALC 70 10/14/2022   LDLDIRECT 162.3 03/21/2012   TRIG 64.0 10/14/2022   CHOLHDL 3 10/14/2022   No results found for: LABRPR, RPRTITER No results found for: HIV1RNAQUANT, HIV1RNAVL, CD4TABS   CT chest 03/06/23 1. Extensive mucoid impaction within the right lower lobe with  associated cylindrical bronchiectasis, right lower lobe greater than  right middle lobe tree-in-bud nodularity and centrilobular groundglass  nodules, and right lower lobe dependent consolidation with small pleural  effusion. Constellation of imaging findings  is most in keeping with an  infectious/inflammatory etiology, including however not limited to  atypical mycobacterial infection. Recommend correlation with  bronchoscopy. Chest CT follow-up within 1-3 months recommended.  2. Sub-5 mm nodular foci within the trachea may represent mucous/debris  however small tracheal polyps not excluded. This could be best  characterized with bronchoscopy.  3. Moderately advanced coronary artery calcification. Consider  correlation with dedicated coronary calcium  scoring CT for risk  stratification as clinically warranted.  4. Sequela of remote granulomatous disease.   Unless the patient's specific circumstances suggest otherwise, any liver  lesion 0.5 cm or less, any cystic kidney lesion less than 1.0 cm, and/or  any adrenal lesion 1.0 cm or less not otherwise characterized in this  report as possessing suspicious or indeterminate imaging features is/are  most likely to be benign and do not require follow-up imaging or biopsy.   Assessment/Plan # Recurrent UTI  - can stop taking macrobid , already on day 6 - resume  methenamine  and vitamin C as is. Meds refilled. Vit C is OTC. Told to stop methenamine  if actively being treated for UTI  - 7/8 CBC and LFT discussed ( mild transaminitis) - fu with Urology for hematuria ( had last phone visit on 09/14/23)  # Incomplete Quadriplegia  # Neurogenic Bladder s/p [facility] # Urinary Incontinence - On Fesoterodine  and Mirabegron - Follows Urology   # Bronchiectasis/multiple abnormal findings in CT chest  - needs to fu with Pulmonology    I spent 27  minutes involved in face-to-face and non-face-to-face activities for this patient on the day of the visit. Professional time spent includes the following activities: Preparing to see the patient (review of tests), Obtaining and reviewing separately obtained history (multiple PCP, wound care notes, PMR notes, Podiatry notes and telephone notes from Urology), Performing a  medically appropriate examination and evaluation , Ordering medications,  Documenting clinical information in the EMR, Independently interpreting results (not separately reported), Communicating results to the patient/caregiver, Counseling and educating the patient/caregiver.  Annalee Joseph, MD [facility] 12/26/2023, 1:38 PM

## 2023-12-27 ENCOUNTER — Ambulatory Visit: Admitting: Physical Therapy

## 2023-12-27 ENCOUNTER — Encounter: Admitting: Occupational Therapy

## 2023-12-27 ENCOUNTER — Ambulatory Visit: Admitting: Infectious Diseases

## 2023-12-28 DIAGNOSIS — R319 Hematuria, unspecified: Secondary | ICD-10-CM | POA: Insufficient documentation

## 2023-12-29 ENCOUNTER — Ambulatory Visit: Payer: Self-pay | Admitting: Physical Therapy

## 2023-12-29 ENCOUNTER — Ambulatory Visit: Admitting: Occupational Therapy

## 2023-12-29 DIAGNOSIS — M6281 Muscle weakness (generalized): Secondary | ICD-10-CM | POA: Diagnosis not present

## 2023-12-29 DIAGNOSIS — M25642 Stiffness of left hand, not elsewhere classified: Secondary | ICD-10-CM | POA: Diagnosis not present

## 2023-12-29 DIAGNOSIS — M24541 Contracture, right hand: Secondary | ICD-10-CM

## 2023-12-29 DIAGNOSIS — M25641 Stiffness of right hand, not elsewhere classified: Secondary | ICD-10-CM | POA: Diagnosis not present

## 2023-12-29 DIAGNOSIS — R293 Abnormal posture: Secondary | ICD-10-CM

## 2023-12-29 DIAGNOSIS — G8254 Quadriplegia, C5-C7 incomplete: Secondary | ICD-10-CM

## 2023-12-29 DIAGNOSIS — R278 Other lack of coordination: Secondary | ICD-10-CM

## 2023-12-29 DIAGNOSIS — R29818 Other symptoms and signs involving the nervous system: Secondary | ICD-10-CM

## 2023-12-29 DIAGNOSIS — R29898 Other symptoms and signs involving the musculoskeletal system: Secondary | ICD-10-CM

## 2023-12-29 DIAGNOSIS — R208 Other disturbances of skin sensation: Secondary | ICD-10-CM

## 2023-12-29 DIAGNOSIS — M24542 Contracture, left hand: Secondary | ICD-10-CM

## 2023-12-29 NOTE — Therapy (Signed)
 OUTPATIENT PHYSICAL THERAPY NEURO TREATMENT   Patient Name: [name] MRN: [id number] DOB:[birth year], 73 y.o., female Today's Date: 12/29/2023   PCP: [name], MD REFERRING PROVIDER: [name], MD   END OF SESSION:   PT End of Session - 12/29/23 0934     Visit Number 31    Number of Visits 45   29 + 16 at re-cert 7/9   Date for PT Re-Evaluation 03/02/24   to allow for scheduling delays/known upcoming pt conflicts   Authorization Type [employer] MEDICARE    PT Start Time 0930    PT Stop Time 1015    PT Time Calculation (min) 45 min    Equipment Utilized During Treatment Gait belt    Activity Tolerance Patient tolerated treatment well    Behavior During Therapy [facility] for tasks assessed/performed           Past Medical History:  Diagnosis Date   Arthritis 2019   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 Aneita   Fibroid 2004   Per Dr. Lenon   History of shingles    face and mouth   Hx of skin cancer, basal cell    Hyperlipidemia 2022   lower legs and feet   Rosacea    Sciatica of left side 09/28/2013   Scoliosis    noted on mri done for back pain   Past Surgical History:  Procedure Laterality Date   BUNIONECTOMY     SPINE SURGERY  07/28/20   fused C5-C6   Patient Active Problem List   Diagnosis Date Noted   Hematuria 12/28/2023   Medication management 12/26/2023   Buttock wound, left, subsequent encounter 03/16/2023   Bronchiectasis with acute exacerbation (HCC) 03/15/2023   Buttock wound, left, initial encounter 11/02/2022   Orthostatic hypotension 08/13/2022   Neurogenic bowel 05/03/2022   Spasticity 05/03/2022   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/03/2022   Nerve pain 05/03/2022   Medication monitoring encounter 01/08/2022   Neurogenic bladder 10/11/2021   Urinary incontinence 10/11/2021   ESBL (extended spectrum beta-lactamase) producing bacteria  infection 10/09/2021   Recurrent UTI 10/09/2021   Quadriplegia, C5-C7 incomplete (HCC) 01/16/2021   History of spinal fracture 01/16/2021   Suprapubic catheter (HCC) 01/16/2021   Encounter for routine gynecological examination 09/28/2013   Onychomycosis 09/28/2013   Foot deformity, acquired 03/26/2012   Encounter for preventive health examination 12/25/2010   ROSACEA 08/25/2009   Disturbance in sleep behavior 03/11/2008   SKIN CANCER, HX OF 03/11/2008   DYSURIA, HX OF 03/11/2008   Hyperlipidemia 02/10/2007   CERVICALGIA 02/10/2007    ONSET DATE: 08/29/2023 (referral date)  REFERRING DIAG: G82.54 (ICD-10-CM) - Incomplete quadriplegia at C5-C8 level (HCC)  THERAPY DIAG:  Muscle weakness (generalized)  Other lack of coordination  Other symptoms and signs involving the nervous system  Abnormal posture  Quadriplegia, C5-C7 incomplete (HCC)  Other symptoms and signs involving the musculoskeletal system  Rationale for Evaluation and Treatment: Rehabilitation  SUBJECTIVE:  SUBJECTIVE STATEMENT:  Pt has been using her stander a few times this week, got up to standing x 45 min. Pt getting ready to leave for [place] next Friday.   From initial eval: Pt familiar to this clinic, last seen Oct-Nov 2024 but has been seen for multiple POCs since her initial injury in 2022. Pt returns to this clinic after being in [place]  for the past few months. While in [place]  in December 2024 patient had a fall where she slid forwards out of her wheelchair onto the floor, ended up fracturing both of her femurs. Pt was hospitalized for 10-11 days and had surgical repair of her femurs. Pt reports she has been cleared of all restrictions since surgery, does have ongoing swelling in both legs and worsened spasticity. Pt  reports that the spasticity has slightly improved since it initially started, was told by Dr. Lovorn the swelling may not resolve for 6-8 months (it has been 3 months) and not sure if spasticity will resolve but patient is hopeful that if her swelling improves her spasticity will improve as well.  Pt asking about other options to help manage the swelling her legs, has tried variable compression stockings (knee high) and her PTs in [place]  recommended tight shapewear. Pt reports that the knee-high compression stockings led to increased swelling in her knees and the shapewear did not help her swelling. Encouraged patient to get thigh-high compression stockings and elevate her LE in PWC. Pt has ordered an articulating bed that will get here next Monday (3/31) so she can better elevate her legs when in bed.  Pt is also concerned about decreased ROM in her legs, [name] but she feels she has more motion in her LLE as compared to RLE and may have mild foot drop on her R side. Pt does have PRAFOs to wear at night. Pt has worked up to standing in her standing frame x 30-40 min at a time. Pt also worked on standing with her PT and in // bars in [place] . Pt also reports with her injuries she lost the ability to lock/unlock her R knee but that it is getting better. She reports she lost a lot of stamina during her hospital stay as well.  Pt also has a new wound since last seen in this clinic in her L gluteal fold, shearing injury. Pt was seeing wound care in [place]  and is scheduled to see wound care with [facility] early April (was not able to schedule with [facility] wound care until late April).  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 Yes: NPRS scale: 3-4 Pain location: elbows to fingertips on both arms Pain description: nerve pain Aggravating  factors: not stated Relieving factors: not stated  PRECAUTIONS: Fall and Other: osteoporosis  RED FLAGS: None   WEIGHT BEARING RESTRICTIONS: No  FALLS: Has patient fallen in last 6 months? Yes. Number of falls 1 fall in [place]  that resulted in B femur fractures  LIVING ENVIRONMENT: Lives with: lives with their spouse and and with full-time caregiver [name] Lives in: House/apartment Home is power wheelchair accessible Has following equipment at home: Wheelchair (power), Wheelchair (manual), Grab bars, Ramped entry, and standing frame, slide board  PLOF: Independent with household mobility with device, Independent with community mobility with device, Requires assistive device for independence, Needs assistance with ADLs, and Needs  assistance with transfers  PATIENT GOALS: still working on stand and pivot with goal to pivot to commode or to a chair work on core-will help me with standing improve my stamina - being in the hospital I lost strength/endurance   OBJECTIVE:  Note: Objective measures were completed at Evaluation unless otherwise noted.  DIAGNOSTIC FINDINGS: None update/relevant to this POC  COGNITION: Overall cognitive status: Within functional limits for tasks assessed   SENSATION: Decreased sensation in BUE and BLE secondary to incomplete quadriplegia Decreased sensation in proximal LLE as compared to distal LE  EDEMA:  Circumferential: R knee: 17; L knee: 17.5 and Figure 8: R ankle 21, L ankle 21.5  MUSCLE TONE: increased spasticity in BLE   POSTURE: rounded shoulders and forward head  LOWER EXTREMITY ROM:     Passive  Right Eval Left Eval  Hip flexion Tight hip flexors Tight hip flexors  Hip extension    Hip abduction    Hip adduction    Hip internal rotation    Hip external rotation    Knee flexion Tight HS Tight HS  Knee extension    Ankle dorsiflexion Decreased, tight gastroc Decreased, tight gastroc  Ankle plantarflexion    Ankle  inversion    Ankle eversion     (Blank rows = not tested)  LOWER EXTREMITY MMT:    MMT Right Eval Left Eval  Hip flexion 1 2-  Hip extension    Hip abduction    Hip adduction    Hip internal rotation    Hip external rotation    Knee flexion 0 3  Knee extension 2- 2-  Ankle dorsiflexion 2- 3  Ankle plantarflexion    Ankle inversion    Ankle eversion    (Blank rows = not tested)  BED MOBILITY:  From previous POC: Sit to supine Mod A Supine to sit Mod A Rolling to Right Mod A Rolling to Left Mod A Undulating mattress for wound management on standard bed (elevated-so often doing uphill sliding board transfers); she would like to continue working on sitting up independently, she has been working on rolling, needs less assistance w/ this when someone props her leg into hooklying; would like something to help her pull her left leg to her butt for stretching as well as bed mobility.  TRANSFERS: From previous POC: Pt continues using combination of bump over, slide board, and depression (squat pivot) transfers.                                                                                                                              TREATMENT:   NMR Pt received seated in her PWC. Bump transfer across slide board PWC to mat table with min A for RLE management. Sit to supine mod A for BLE management. Supine to sidelying to prone with min A needed for ULE management. While in prone position on mat table: Prone (flat over pillow) to prone press-up x 10 reps  No assist needed for UE management this time Prone scap squeezes from press-up position x 10 reps Pt needs assist to get RUE into press-up position Prone cervical retraction x 10 reps Supine cervical retraction x 10 reps Rolling L/R Min A needed to roll each direction for ULE management and min A at hips to fully roll over. Pt continues to struggle with rolling L/R as independently as she has been able to in the past, continues  to benefit from skilled practice of this. Slide board transfer back to [facility] with min A at end of session. Pt left seated in her PWC with [name] to assist with repositioning.    PATIENT EDUCATION: Education details: Continue stretching program and UB strengthening exercises Person educated: Patient and Arts administrator Education method: Explanation, Demonstration, Tactile cues, and Verbal cues Education comprehension: verbalized understanding and returned demonstration  HOME EXERCISE PROGRAM: Access Code: [email or web address] URL: [email or web address] Date: 11/22/2023 Prepared by: Waddell Southgate  Exercises - Seated Elbow Extension with Self-Anchored Resistance  - 1 x daily - 7 x weekly - 3 sets - 10 reps - Supine Elbow Flexion Extension with Dumbbell  - 1 x daily - 7 x weekly - 3 sets - 10 reps - Seated Single Arm Elbow Extension Push-up on Table  - 1 x daily - 7 x weekly - 3 sets - 10 reps - Wheelchair Push-Up (AKA)  - 1 x daily - 7 x weekly - 3 sets - 5 reps - Seated Biceps Curl  - 1 x daily - 7 x weekly - 3 sets - 10 reps - Forearm Pronation and Supination with Hammer  - 1 x daily - 7 x weekly - 3 sets - 10 reps - Seated Wrist Extension with Dumbbell  - 1 x daily - 7 x weekly - 3 sets - 10 reps  GOALS: Goals reviewed with patient? Yes  GOALS (at 7/9 re-cert): Goals reviewed with patient? Yes  SHORT TERM GOALS:   Target date: 01/20/2024  Pt will perform rolling left and right from supine at no more than minA in order to demonstrate progression back to PLOF. Baseline: mod-maxA over recent 2 weeks (7/9) Goal status: INITIAL  2.  Pt will be able to reach outside of short sitting BOS and recover at no more than mod I level to demonstrate functional core strengthening. Baseline: min-modA (7/9) Goal status: INITIAL  LONG TERM GOALS:  Target date: 02/17/2024  Pt will perform sit to stand transfer with LRAD with min A Baseline: max A to stedy (4/4), max A in // bars (4/25), max A in  // bars (5/22), max A to +2 in // bars (6/18); modA (7/9) Goal status: ONGOING  2.  Pt to tolerate standing x 6 min using upright posture with LRAD to demonstrate improved endurance. Baseline: 30 sec in stedy (initial), 2:35 min in // bars (4/25), 3 min in // bars (5/22), 4 min in // bars (6/18); 5 minutes 4 seconds in // bars (7/9) Goal status: ONGOING  3.  Pt will be able to obtain static long sitting position with no more than minA in order to promote improved functional positioning at home. Baseline: modA for trunk and LE support into forward positioning (7/9) Goal status: INITIAL  ASSESSMENT:  CLINICAL IMPRESSION: Emphasis of skilled PT session on continuing to work on bump transfers PWC to/from mat table, working on rolling supine to/from prone on mat table, and working on prone postural strengthening exercises. Pt requires up to min A for rolling L/R  from supine and mod A needed for prone to supine. She gets frustrated by amount of assist needed with rolling and reports this was easier for her in the past. She continues to benefit from skilled practice of rolling L/R and bed mobility to increase her independence with this. Continue POC.   OBJECTIVE IMPAIRMENTS: decreased balance, decreased endurance, decreased mobility, difficulty walking, decreased ROM, decreased strength, increased edema, impaired perceived functional ability, increased muscle spasms, impaired flexibility, impaired sensation, impaired tone, impaired UE functional use, postural dysfunction, and pain.   ACTIVITY LIMITATIONS: carrying, lifting, bending, standing, stairs, transfers, bed mobility, continence, bathing, toileting, dressing, reach over head, and hygiene/grooming  PARTICIPATION LIMITATIONS: meal prep, cleaning, laundry, driving, shopping, community activity, and occupation  PERSONAL FACTORS: Age, Sex, Time since onset of injury/illness/exacerbation, and 1-2 comorbidities:   C7 ASIA C- incomplete quad w/  neurogenic bladder and bowel, HLD, Hx of skin cancerare also affecting patient's functional outcome.   REHAB POTENTIAL: Good  CLINICAL DECISION MAKING: Stable/uncomplicated  EVALUATION COMPLEXITY: High  PLAN:  PT FREQUENCY: 2x/week  PT DURATION: 8 weeks + 8 wks  PLANNED INTERVENTIONS: 02835- PT Re-evaluation, 97750- Physical Performance Testing, 97110-Therapeutic exercises, 97530- Therapeutic activity, 97112- Neuromuscular re-education, [phone]- Self Care, 02859- Manual therapy, [phone]- Gait training, [phone]- Orthotic Initial, [phone]- Electrical stimulation (manual), [phone] (1-2 muscles), [phone] (3+ muscles)- Dry Needling, Patient/Family education, Balance training, Stair training, Taping, Joint mobilization, Scar mobilization, Compression bandaging, DME instructions, Wheelchair mobility training, Cryotherapy, and Moist heat  PLAN FOR NEXT SESSION: any changes since trip to [place]? sit to stands, standing tolerance with stedy, in // bars, possibly with RW, core strengthening/stability, endurance; wants to have conversation about taking a break (3-6 months) after this POC near end of this POC, standing lateral weight shifts, mini-squats in standing?, Yoga block press-ups vs push-up blocks, long-sitting - unsupported for core work?, bump transfers with and without slide board - pt wants to work on this skill!, lateral leans, work on mini squats in standing frame, prone UB strengthening, rolling L/R on mat and breaking down parts of rolling    Waddell Southgate, PT Waddell Southgate, PT, DPT, CSRS   12/29/2023, 10:16 AM
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
## 2024-01-02 ENCOUNTER — Encounter: Admitting: Physical Medicine and Rehabilitation

## 2024-01-04 ENCOUNTER — Other Ambulatory Visit: Payer: Self-pay | Admitting: "Endocrinology

## 2024-01-16 ENCOUNTER — Encounter: Payer: Self-pay | Admitting: Internal Medicine

## 2024-01-16 DIAGNOSIS — R7989 Other specified abnormal findings of blood chemistry: Secondary | ICD-10-CM

## 2024-01-16 DIAGNOSIS — D649 Anemia, unspecified: Secondary | ICD-10-CM

## 2024-01-17 ENCOUNTER — Ambulatory Visit: Admitting: "Endocrinology

## 2024-01-17 ENCOUNTER — Encounter: Payer: Self-pay | Admitting: "Endocrinology

## 2024-01-17 ENCOUNTER — Ambulatory Visit: Attending: Physical Medicine and Rehabilitation | Admitting: Physical Therapy

## 2024-01-17 ENCOUNTER — Ambulatory Visit: Admitting: Occupational Therapy

## 2024-01-17 VITALS — BP 104/80 | HR 74 | Ht 64.0 in | Wt 124.0 lb

## 2024-01-17 DIAGNOSIS — R29898 Other symptoms and signs involving the musculoskeletal system: Secondary | ICD-10-CM | POA: Insufficient documentation

## 2024-01-17 DIAGNOSIS — M24541 Contracture, right hand: Secondary | ICD-10-CM

## 2024-01-17 DIAGNOSIS — M25642 Stiffness of left hand, not elsewhere classified: Secondary | ICD-10-CM | POA: Insufficient documentation

## 2024-01-17 DIAGNOSIS — M6281 Muscle weakness (generalized): Secondary | ICD-10-CM | POA: Insufficient documentation

## 2024-01-17 DIAGNOSIS — M25641 Stiffness of right hand, not elsewhere classified: Secondary | ICD-10-CM

## 2024-01-17 DIAGNOSIS — M810 Age-related osteoporosis without current pathological fracture: Secondary | ICD-10-CM | POA: Diagnosis not present

## 2024-01-17 DIAGNOSIS — G8254 Quadriplegia, C5-C7 incomplete: Secondary | ICD-10-CM | POA: Insufficient documentation

## 2024-01-17 DIAGNOSIS — R29818 Other symptoms and signs involving the nervous system: Secondary | ICD-10-CM | POA: Diagnosis not present

## 2024-01-17 DIAGNOSIS — M24542 Contracture, left hand: Secondary | ICD-10-CM

## 2024-01-17 DIAGNOSIS — R278 Other lack of coordination: Secondary | ICD-10-CM

## 2024-01-17 DIAGNOSIS — R208 Other disturbances of skin sensation: Secondary | ICD-10-CM

## 2024-01-17 DIAGNOSIS — R293 Abnormal posture: Secondary | ICD-10-CM | POA: Insufficient documentation
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
## 2024-01-17 NOTE — Therapy (Signed)
 OUTPATIENT PHYSICAL THERAPY NEURO TREATMENT   Patient Name: [name] MRN: [id number] DOB:[birth year], 73 y.o., female Today's Date: 01/17/2024   PCP: [name], MD REFERRING PROVIDER: [name], MD   END OF SESSION:   PT End of Session - 01/17/24 1104     Visit Number 32    Number of Visits 45   29 + 16 at re-cert 7/9   Date for PT Re-Evaluation 03/02/24   to allow for scheduling delays/known upcoming pt conflicts   Authorization Type [employer] MEDICARE    PT Start Time 1104   from OT session   PT Stop Time 1145    PT Time Calculation (min) 41 min    Equipment Utilized During Treatment Gait belt    Activity Tolerance Patient tolerated treatment well    Behavior During Therapy [facility] for tasks assessed/performed            Past Medical History:  Diagnosis Date   Arthritis 2019   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 Aneita   Fibroid 2004   Per Dr. Lenon   History of shingles    face and mouth   Hx of skin cancer, basal cell    Hyperlipidemia 2022   lower legs and feet   Rosacea    Sciatica of left side 09/28/2013   Scoliosis    noted on mri done for back pain   Past Surgical History:  Procedure Laterality Date   BUNIONECTOMY     SPINE SURGERY  07/28/20   fused C5-C6   Patient Active Problem List   Diagnosis Date Noted   Hematuria 12/28/2023   Medication management 12/26/2023   Buttock wound, left, subsequent encounter 03/16/2023   Bronchiectasis with acute exacerbation (HCC) 03/15/2023   Buttock wound, left, initial encounter 11/02/2022   Orthostatic hypotension 08/13/2022   Neurogenic bowel 05/03/2022   Spasticity 05/03/2022   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/03/2022   Nerve pain 05/03/2022   Medication monitoring encounter 01/08/2022   Neurogenic bladder 10/11/2021   Urinary incontinence 10/11/2021   ESBL (extended spectrum beta-lactamase)  producing bacteria infection 10/09/2021   Recurrent UTI 10/09/2021   Quadriplegia, C5-C7 incomplete (HCC) 01/16/2021   History of spinal fracture 01/16/2021   Suprapubic catheter (HCC) 01/16/2021   Encounter for routine gynecological examination 09/28/2013   Onychomycosis 09/28/2013   Foot deformity, acquired 03/26/2012   Encounter for preventive health examination 12/25/2010   ROSACEA 08/25/2009   Disturbance in sleep behavior 03/11/2008   SKIN CANCER, HX OF 03/11/2008   DYSURIA, HX OF 03/11/2008   Hyperlipidemia 02/10/2007   CERVICALGIA 02/10/2007    ONSET DATE: 08/29/2023 (referral date)  REFERRING DIAG: G82.54 (ICD-10-CM) - Incomplete quadriplegia at C5-C8 level (HCC)  THERAPY DIAG:  Muscle weakness (generalized)  Other lack of coordination  Abnormal posture  Quadriplegia, C5-C7 incomplete (HCC)  Other symptoms and signs involving the musculoskeletal system  Other symptoms and signs involving the nervous system  Rationale for Evaluation and Treatment: Rehabilitation  SUBJECTIVE:  SUBJECTIVE STATEMENT:  Pt had a good trip to [place], no falls and no damage to her power wheelchair. She did have to do a lot of unlevel transfers from the bed to her chair. Pt did get treated for a UTI during her trip.   From initial eval: Pt familiar to this clinic, last seen Oct-Nov 2024 but has been seen for multiple POCs since her initial injury in 2022. Pt returns to this clinic after being in [place]  for the past few months. While in [place]  in December 2024 patient had a fall where she slid forwards out of her wheelchair onto the floor, ended up fracturing both of her femurs. Pt was hospitalized for 10-11 days and had surgical repair of her femurs. Pt reports she has been cleared of all restrictions  since surgery, does have ongoing swelling in both legs and worsened spasticity. Pt reports that the spasticity has slightly improved since it initially started, was told by Dr. Lovorn the swelling may not resolve for 6-8 months (it has been 3 months) and not sure if spasticity will resolve but patient is hopeful that if her swelling improves her spasticity will improve as well.  Pt asking about other options to help manage the swelling her legs, has tried variable compression stockings (knee high) and her PTs in [place]  recommended tight shapewear. Pt reports that the knee-high compression stockings led to increased swelling in her knees and the shapewear did not help her swelling. Encouraged patient to get thigh-high compression stockings and elevate her LE in PWC. Pt has ordered an articulating bed that will get here next Monday (3/31) so she can better elevate her legs when in bed.  Pt is also concerned about decreased ROM in her legs, [name] but she feels she has more motion in her LLE as compared to RLE and may have mild foot drop on her R side. Pt does have PRAFOs to wear at night. Pt has worked up to standing in her standing frame x 30-40 min at a time. Pt also worked on standing with her PT and in // bars in [place] . Pt also reports with her injuries she lost the ability to lock/unlock her R knee but that it is getting better. She reports she lost a lot of stamina during her hospital stay as well.  Pt also has a new wound since last seen in this clinic in her L gluteal fold, shearing injury. Pt was seeing wound care in [place]  and is scheduled to see wound care with [facility] early April (was not able to schedule with [facility] wound care until late April).  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 Yes: NPRS scale: 3-4 Pain location:  elbows to fingertips on both arms Pain description: nerve pain Aggravating factors: not stated Relieving factors: not stated  PRECAUTIONS: Fall and Other: osteoporosis  RED FLAGS: None   WEIGHT BEARING RESTRICTIONS: No  FALLS: Has patient fallen in last 6 months? Yes. Number of falls 1 fall in [place]  that resulted in B femur fractures  LIVING ENVIRONMENT: Lives with: lives with their spouse and and with full-time caregiver [name] Lives in: House/apartment Home is power wheelchair accessible Has following equipment at home: Wheelchair (power), Wheelchair (manual), Grab bars, Ramped entry, and standing frame, slide board  PLOF: Independent with household mobility with device, Independent with  community mobility with device, Requires assistive device for independence, Needs assistance with ADLs, and Needs assistance with transfers  PATIENT GOALS: still working on stand and pivot with goal to pivot to commode or to a chair work on core-will help me with standing improve my stamina - being in the hospital I lost strength/endurance   OBJECTIVE:  Note: Objective measures were completed at Evaluation unless otherwise noted.  DIAGNOSTIC FINDINGS: None update/relevant to this POC  COGNITION: Overall cognitive status: Within functional limits for tasks assessed   SENSATION: Decreased sensation in BUE and BLE secondary to incomplete quadriplegia Decreased sensation in proximal LLE as compared to distal LE  EDEMA:  Circumferential: R knee: 17; L knee: 17.5 and Figure 8: R ankle 21, L ankle 21.5  MUSCLE TONE: increased spasticity in BLE   POSTURE: rounded shoulders and forward head  LOWER EXTREMITY ROM:     Passive  Right Eval Left Eval  Hip flexion Tight hip flexors Tight hip flexors  Hip extension    Hip abduction    Hip adduction    Hip internal rotation    Hip external rotation    Knee flexion Tight HS Tight HS  Knee extension    Ankle dorsiflexion Decreased,  tight gastroc Decreased, tight gastroc  Ankle plantarflexion    Ankle inversion    Ankle eversion     (Blank rows = not tested)  LOWER EXTREMITY MMT:    MMT Right Eval Left Eval  Hip flexion 1 2-  Hip extension    Hip abduction    Hip adduction    Hip internal rotation    Hip external rotation    Knee flexion 0 3  Knee extension 2- 2-  Ankle dorsiflexion 2- 3  Ankle plantarflexion    Ankle inversion    Ankle eversion    (Blank rows = not tested)  BED MOBILITY:  From previous POC: Sit to supine Mod A Supine to sit Mod A Rolling to Right Mod A Rolling to Left Mod A Undulating mattress for wound management on standard bed (elevated-so often doing uphill sliding board transfers); she would like to continue working on sitting up independently, she has been working on rolling, needs less assistance w/ this when someone props her leg into hooklying; would like something to help her pull her left leg to her butt for stretching as well as bed mobility.  TRANSFERS: From previous POC: Pt continues using combination of bump over, slide board, and depression (squat pivot) transfers.                                                                                                                              TREATMENT:   NMR Pt received seated in her PWC. Slide board transfer PWC to mat table with min A for RLE management. Sit to supine mod A for BLE management. Supine to long-sitting with mod A for trunk elevation: PROM stretching to BLE for hips, knees,  and ankles due to increased tightness in joints this date as pt has not been able to work on her stretching program as much while in [employer] Long sitting balance on mat table with CGA for trunk support: Reaching for cones outside BOS and across midline Easier to reach across midline as pt able to brace herself with her UE/elbow on mat table Performing 2# weighted dowel chest press 3 x 10 reps Slide board transfer back to [facility] with max A  at end of session due to patient having to go uphill as therapy mat is not height adjustable. Pt left seated in her PWC with [name] to assist with repositioning.    PATIENT EDUCATION: Education details: Continue stretching program and UB strengthening exercises Person educated: Patient and Arts administrator Education method: Explanation, Demonstration, Tactile cues, and Verbal cues Education comprehension: verbalized understanding and returned demonstration  HOME EXERCISE PROGRAM: Access Code: [email or web address] URL: [email or web address] Date: 11/22/2023 Prepared by: Waddell Southgate  Exercises - Seated Elbow Extension with Self-Anchored Resistance  - 1 x daily - 7 x weekly - 3 sets - 10 reps - Supine Elbow Flexion Extension with Dumbbell  - 1 x daily - 7 x weekly - 3 sets - 10 reps - Seated Single Arm Elbow Extension Push-up on Table  - 1 x daily - 7 x weekly - 3 sets - 10 reps - Wheelchair Push-Up (AKA)  - 1 x daily - 7 x weekly - 3 sets - 5 reps - Seated Biceps Curl  - 1 x daily - 7 x weekly - 3 sets - 10 reps - Forearm Pronation and Supination with Hammer  - 1 x daily - 7 x weekly - 3 sets - 10 reps - Seated Wrist Extension with Dumbbell  - 1 x daily - 7 x weekly - 3 sets - 10 reps  GOALS: Goals reviewed with patient? Yes  GOALS (at 7/9 re-cert): Goals reviewed with patient? Yes  SHORT TERM GOALS:   Target date: 01/20/2024  Pt will perform rolling left and right from supine at no more than minA in order to demonstrate progression back to PLOF. Baseline: mod-maxA over recent 2 weeks (7/9) Goal status: INITIAL  2.  Pt will be able to reach outside of short sitting BOS and recover at no more than mod I level to demonstrate functional core strengthening. Baseline: min-modA (7/9) Goal status: INITIAL  LONG TERM GOALS:  Target date: 02/17/2024  Pt will perform sit to stand transfer with LRAD with min A Baseline: max A to stedy (4/4), max A in // bars (4/25), max A in // bars  (5/22), max A to +2 in // bars (6/18); modA (7/9) Goal status: ONGOING  2.  Pt to tolerate standing x 6 min using upright posture with LRAD to demonstrate improved endurance. Baseline: 30 sec in stedy (initial), 2:35 min in // bars (4/25), 3 min in // bars (5/22), 4 min in // bars (6/18); 5 minutes 4 seconds in // bars (7/9) Goal status: ONGOING  3.  Pt will be able to obtain static long sitting position with no more than minA in order to promote improved functional positioning at home. Baseline: modA for trunk and LE support into forward positioning (7/9) Goal status: INITIAL  ASSESSMENT:  CLINICAL IMPRESSION: Emphasis of skilled PT session on performing PROM to BLE due to increased tightness following recent travel with remainder of session focused on sitting balance in long-sitting position while reaching outside BOS and across midline. Pt only needs CGA  for balance with no LOB noted in this position. She continues to benefit from skilled PT services to work on increased independent with bed mobility, with transfers, with standing, and with overall strengthening in order to improve her functional mobility. Continue POC.   OBJECTIVE IMPAIRMENTS: decreased balance, decreased endurance, decreased mobility, difficulty walking, decreased ROM, decreased strength, increased edema, impaired perceived functional ability, increased muscle spasms, impaired flexibility, impaired sensation, impaired tone, impaired UE functional use, postural dysfunction, and pain.   ACTIVITY LIMITATIONS: carrying, lifting, bending, standing, stairs, transfers, bed mobility, continence, bathing, toileting, dressing, reach over head, and hygiene/grooming  PARTICIPATION LIMITATIONS: meal prep, cleaning, laundry, driving, shopping, community activity, and occupation  PERSONAL FACTORS: Age, Sex, Time since onset of injury/illness/exacerbation, and 1-2 comorbidities:   C7 ASIA C- incomplete quad w/ neurogenic bladder and  bowel, HLD, Hx of skin cancerare also affecting patient's functional outcome.   REHAB POTENTIAL: Good  CLINICAL DECISION MAKING: Stable/uncomplicated  EVALUATION COMPLEXITY: High  PLAN:  PT FREQUENCY: 2x/week  PT DURATION: 8 weeks + 8 wks  PLANNED INTERVENTIONS: 02835- PT Re-evaluation, 97750- Physical Performance Testing, 97110-Therapeutic exercises, 97530- Therapeutic activity, 97112- Neuromuscular re-education, [phone]- Self Care, 02859- Manual therapy, [phone]- Gait training, [phone]- Orthotic Initial, [phone]- Electrical stimulation (manual), [phone] (1-2 muscles), [phone] (3+ muscles)- Dry Needling, Patient/Family education, Balance training, Stair training, Taping, Joint mobilization, Scar mobilization, Compression bandaging, DME instructions, Wheelchair mobility training, Cryotherapy, and Moist heat  PLAN FOR NEXT SESSION: sit to stands, standing tolerance with stedy, in // bars, possibly with RW, core strengthening/stability, endurance; wants to have conversation about taking a break (3-6 months) after this POC near end of this POC, standing lateral weight shifts, mini-squats in standing?, Yoga block press-ups vs push-up blocks, long-sitting - unsupported for core work?, bump transfers with and without slide board - pt wants to work on this skill!, lateral leans, work on mini squats in standing frame, prone UB strengthening, rolling L/R on mat and breaking down parts of rolling    Waddell Southgate, PT Waddell Southgate, PT, DPT, CSRS   01/17/2024, 11:50 AM

## 2024-01-18 DIAGNOSIS — F4321 Adjustment disorder with depressed mood: Secondary | ICD-10-CM | POA: Diagnosis not present

## 2024-01-19 ENCOUNTER — Telehealth: Payer: Self-pay

## 2024-01-19 ENCOUNTER — Ambulatory Visit: Admitting: Physical Therapy

## 2024-01-19 ENCOUNTER — Other Ambulatory Visit (HOSPITAL_COMMUNITY): Payer: Self-pay

## 2024-01-19 ENCOUNTER — Encounter: Payer: Self-pay | Admitting: Occupational Therapy

## 2024-01-19 ENCOUNTER — Ambulatory Visit: Admitting: Occupational Therapy

## 2024-01-19 DIAGNOSIS — R29818 Other symptoms and signs involving the nervous system: Secondary | ICD-10-CM

## 2024-01-19 DIAGNOSIS — R29898 Other symptoms and signs involving the musculoskeletal system: Secondary | ICD-10-CM

## 2024-01-19 DIAGNOSIS — M25642 Stiffness of left hand, not elsewhere classified: Secondary | ICD-10-CM | POA: Diagnosis not present

## 2024-01-19 DIAGNOSIS — M6281 Muscle weakness (generalized): Secondary | ICD-10-CM | POA: Diagnosis not present

## 2024-01-19 DIAGNOSIS — R278 Other lack of coordination: Secondary | ICD-10-CM | POA: Diagnosis not present

## 2024-01-19 DIAGNOSIS — R338 Other retention of urine: Secondary | ICD-10-CM | POA: Diagnosis not present

## 2024-01-19 DIAGNOSIS — M24541 Contracture, right hand: Secondary | ICD-10-CM | POA: Diagnosis not present

## 2024-01-19 DIAGNOSIS — M25641 Stiffness of right hand, not elsewhere classified: Secondary | ICD-10-CM | POA: Diagnosis not present

## 2024-01-19 DIAGNOSIS — N319 Neuromuscular dysfunction of bladder, unspecified: Secondary | ICD-10-CM | POA: Diagnosis not present

## 2024-01-19 DIAGNOSIS — G8254 Quadriplegia, C5-C7 incomplete: Secondary | ICD-10-CM | POA: Diagnosis not present

## 2024-01-19 DIAGNOSIS — R293 Abnormal posture: Secondary | ICD-10-CM | POA: Diagnosis not present

## 2024-01-19 NOTE — Telephone Encounter (Signed)
 Pharmacy Patient Advocate Encounter   Received notification from Pt Calls Messages that prior authorization for Forteo  is required/requested.   Insurance verification completed.   The patient is insured through [employer] .   Per test claim: Refill too soon. PA is not needed at this time. Medication was filled 01/18/24. Next eligible fill date is 02/11/24.   PA on file expires 06/13/2024

## 2024-01-19 NOTE — Therapy (Signed)
 OUTPATIENT PHYSICAL THERAPY NEURO TREATMENT   Patient Name: [name] MRN: [id number] DOB:[birth year], 73 y.o., female Today's Date: 01/19/2024   PCP: [name], MD REFERRING PROVIDER: [name], MD   END OF SESSION:   PT End of Session - 01/19/24 1019     Visit Number 33    Number of Visits 45   29 + 16 at re-cert 7/9   Date for PT Re-Evaluation 03/02/24   to allow for scheduling delays/known upcoming pt conflicts   Authorization Type [employer] MEDICARE    PT Start Time 1015    PT Stop Time 1100    PT Time Calculation (min) 45 min    Equipment Utilized During Treatment Gait belt    Activity Tolerance Patient tolerated treatment well    Behavior During Therapy [facility] for tasks assessed/performed             Past Medical History:  Diagnosis Date   Arthritis 2019   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 Aneita   Fibroid 2004   Per Dr. Lenon   History of shingles    face and mouth   Hx of skin cancer, basal cell    Hyperlipidemia 2022   lower legs and feet   Rosacea    Sciatica of left side 09/28/2013   Scoliosis    noted on mri done for back pain   Past Surgical History:  Procedure Laterality Date   BUNIONECTOMY     SPINE SURGERY  07/28/20   fused C5-C6   Patient Active Problem List   Diagnosis Date Noted   Hematuria 12/28/2023   Medication management 12/26/2023   Buttock wound, left, subsequent encounter 03/16/2023   Bronchiectasis with acute exacerbation (HCC) 03/15/2023   Buttock wound, left, initial encounter 11/02/2022   Orthostatic hypotension 08/13/2022   Neurogenic bowel 05/03/2022   Spasticity 05/03/2022   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/03/2022   Nerve pain 05/03/2022   Medication monitoring encounter 01/08/2022   Neurogenic bladder 10/11/2021   Urinary incontinence 10/11/2021   ESBL (extended spectrum beta-lactamase) producing bacteria  infection 10/09/2021   Recurrent UTI 10/09/2021   Quadriplegia, C5-C7 incomplete (HCC) 01/16/2021   History of spinal fracture 01/16/2021   Suprapubic catheter (HCC) 01/16/2021   Encounter for routine gynecological examination 09/28/2013   Onychomycosis 09/28/2013   Foot deformity, acquired 03/26/2012   Encounter for preventive health examination 12/25/2010   ROSACEA 08/25/2009   Disturbance in sleep behavior 03/11/2008   SKIN CANCER, HX OF 03/11/2008   DYSURIA, HX OF 03/11/2008   Hyperlipidemia 02/10/2007   CERVICALGIA 02/10/2007    ONSET DATE: 08/29/2023 (referral date)  REFERRING DIAG: G82.54 (ICD-10-CM) - Incomplete quadriplegia at C5-C8 level (HCC)  THERAPY DIAG:  Muscle weakness (generalized)  Other lack of coordination  Other symptoms and signs involving the nervous system  Other symptoms and signs involving the musculoskeletal system  Quadriplegia, C5-C7 incomplete (HCC)  Rationale for Evaluation and Treatment: Rehabilitation  SUBJECTIVE:  SUBJECTIVE STATEMENT:  Pt received seated in her PWC, denies any acute changes since last visit.   From initial eval: Pt familiar to this clinic, last seen Oct-Nov 2024 but has been seen for multiple POCs since her initial injury in 2022. Pt returns to this clinic after being in [place]  for the past few months. While in [place]  in December 2024 patient had a fall where she slid forwards out of her wheelchair onto the floor, ended up fracturing both of her femurs. Pt was hospitalized for 10-11 days and had surgical repair of her femurs. Pt reports she has been cleared of all restrictions since surgery, does have ongoing swelling in both legs and worsened spasticity. Pt reports that the spasticity has slightly improved since it initially started, was  told by Dr. Lovorn the swelling may not resolve for 6-8 months (it has been 3 months) and not sure if spasticity will resolve but patient is hopeful that if her swelling improves her spasticity will improve as well.  Pt asking about other options to help manage the swelling her legs, has tried variable compression stockings (knee high) and her PTs in [place]  recommended tight shapewear. Pt reports that the knee-high compression stockings led to increased swelling in her knees and the shapewear did not help her swelling. Encouraged patient to get thigh-high compression stockings and elevate her LE in PWC. Pt has ordered an articulating bed that will get here next Monday (3/31) so she can better elevate her legs when in bed.  Pt is also concerned about decreased ROM in her legs, [name] but she feels she has more motion in her LLE as compared to RLE and may have mild foot drop on her R side. Pt does have PRAFOs to wear at night. Pt has worked up to standing in her standing frame x 30-40 min at a time. Pt also worked on standing with her PT and in // bars in [place] . Pt also reports with her injuries she lost the ability to lock/unlock her R knee but that it is getting better. She reports she lost a lot of stamina during her hospital stay as well.  Pt also has a new wound since last seen in this clinic in her L gluteal fold, shearing injury. Pt was seeing wound care in [place]  and is scheduled to see wound care with [facility] early April (was not able to schedule with [facility] wound care until late April).  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 Yes: NPRS scale: 3-4 Pain location: elbows to fingertips on both arms Pain description: nerve pain Aggravating factors: not stated Relieving factors: not stated  PRECAUTIONS: Fall and Other:  osteoporosis  RED FLAGS: None   WEIGHT BEARING RESTRICTIONS: No  FALLS: Has patient fallen in last 6 months? Yes. Number of falls 1 fall in [place]  that resulted in B femur fractures  LIVING ENVIRONMENT: Lives with: lives with their spouse and and with full-time caregiver [name] Lives in: House/apartment Home is power wheelchair accessible Has following equipment at home: Wheelchair (power), Wheelchair (manual), Grab bars, Ramped entry, and standing frame, slide board  PLOF: Independent with household mobility with device, Independent with community mobility with device, Requires assistive device for independence, Needs assistance with ADLs, and Needs assistance with transfers  PATIENT GOALS: still working on stand and pivot with goal  to pivot to commode or to a chair work on core-will help me with standing improve my stamina - being in the hospital I lost strength/endurance   OBJECTIVE:  Note: Objective measures were completed at Evaluation unless otherwise noted.  DIAGNOSTIC FINDINGS: None update/relevant to this POC  COGNITION: Overall cognitive status: Within functional limits for tasks assessed   SENSATION: Decreased sensation in BUE and BLE secondary to incomplete quadriplegia Decreased sensation in proximal LLE as compared to distal LE  EDEMA:  Circumferential: R knee: 17; L knee: 17.5 and Figure 8: R ankle 21, L ankle 21.5  MUSCLE TONE: increased spasticity in BLE   POSTURE: rounded shoulders and forward head  LOWER EXTREMITY ROM:     Passive  Right Eval Left Eval  Hip flexion Tight hip flexors Tight hip flexors  Hip extension    Hip abduction    Hip adduction    Hip internal rotation    Hip external rotation    Knee flexion Tight HS Tight HS  Knee extension    Ankle dorsiflexion Decreased, tight gastroc Decreased, tight gastroc  Ankle plantarflexion    Ankle inversion    Ankle eversion     (Blank rows = not tested)  LOWER EXTREMITY MMT:     MMT Right Eval Left Eval  Hip flexion 1 2-  Hip extension    Hip abduction    Hip adduction    Hip internal rotation    Hip external rotation    Knee flexion 0 3  Knee extension 2- 2-  Ankle dorsiflexion 2- 3  Ankle plantarflexion    Ankle inversion    Ankle eversion    (Blank rows = not tested)  BED MOBILITY:  From previous POC: Sit to supine Mod A Supine to sit Mod A Rolling to Right Mod A Rolling to Left Mod A Undulating mattress for wound management on standard bed (elevated-so often doing uphill sliding board transfers); she would like to continue working on sitting up independently, she has been working on rolling, needs less assistance w/ this when someone props her leg into hooklying; would like something to help her pull her left leg to her butt for stretching as well as bed mobility.  TRANSFERS: From previous POC: Pt continues using combination of bump over, slide board, and depression (squat pivot) transfers.                                                                                                                              TREATMENT:   NMR In // bars to work on BLE WB and activation of LE musculature as well as standing tolerance: Sit to stand from elevated PWC seat x 4 reps with max A Difficulty achieving a full stand with first two reps Added sheet around patient's hips for increased pull, B knees blocked by therapist Pt tolerates standing x 1:22 min and 3:45 min Able to lock L knee into extension, unable to lock  R knee Lateral weight shifting L/R   At high/low mat table to work on increased independence with transfers and sitting balance: Bump transfers PWC to/from mat table with assist needed for BLE management (foot placement) WITHOUT use of slide board x 3 reps Pt exhibits improved ability to clear her buttocks and perform transfer more independently this session without use of additional AD Short-sitting balance reaching outside BOS and to  the floor for cone retrieval, up to min A needed for sitting balance Pt utilizes UE bracing on mat table at times to catch her balance and to push herself back up to midline   Slide board transfer back to [facility] with min A at end of session due to fatigue. Pt left seated in her PWC with [name] present to assist with repositioning.    PATIENT EDUCATION: Education details: Continue stretching program and UB strengthening exercises Person educated: Patient and Arts administrator Education method: Explanation, Demonstration, Tactile cues, and Verbal cues Education comprehension: verbalized understanding and returned demonstration  HOME EXERCISE PROGRAM: Access Code: [email or web address] URL: [email or web address] Date: 11/22/2023 Prepared by: Waddell Southgate  Exercises - Seated Elbow Extension with Self-Anchored Resistance  - 1 x daily - 7 x weekly - 3 sets - 10 reps - Supine Elbow Flexion Extension with Dumbbell  - 1 x daily - 7 x weekly - 3 sets - 10 reps - Seated Single Arm Elbow Extension Push-up on Table  - 1 x daily - 7 x weekly - 3 sets - 10 reps - Wheelchair Push-Up (AKA)  - 1 x daily - 7 x weekly - 3 sets - 5 reps - Seated Biceps Curl  - 1 x daily - 7 x weekly - 3 sets - 10 reps - Forearm Pronation and Supination with Hammer  - 1 x daily - 7 x weekly - 3 sets - 10 reps - Seated Wrist Extension with Dumbbell  - 1 x daily - 7 x weekly - 3 sets - 10 reps  GOALS: Goals reviewed with patient? Yes  GOALS (at 7/9 re-cert): Goals reviewed with patient? Yes  SHORT TERM GOALS:   Target date: 01/20/2024  Pt will perform rolling left and right from supine at no more than minA in order to demonstrate progression back to PLOF. Baseline: mod-maxA over recent 2 weeks (7/9) Goal status: INITIAL  2.  Pt will be able to reach outside of short sitting BOS and recover at no more than mod I level to demonstrate functional core strengthening. Baseline: min-modA (7/9), min A (8/7) Goal status: IN  PROGRESS  LONG TERM GOALS:  Target date: 02/17/2024  Pt will perform sit to stand transfer with LRAD with min A Baseline: max A to stedy (4/4), max A in // bars (4/25), max A in // bars (5/22), max A to +2 in // bars (6/18); modA (7/9) Goal status: ONGOING  2.  Pt to tolerate standing x 6 min using upright posture with LRAD to demonstrate improved endurance. Baseline: 30 sec in stedy (initial), 2:35 min in // bars (4/25), 3 min in // bars (5/22), 4 min in // bars (6/18); 5 minutes 4 seconds in // bars (7/9) Goal status: ONGOING  3.  Pt will be able to obtain static long sitting position with no more than minA in order to promote improved functional positioning at home. Baseline: modA for trunk and LE support into forward positioning (7/9) Goal status: INITIAL  ASSESSMENT:  CLINICAL IMPRESSION: Emphasis of skilled PT session on working on sit to stands  in // bars, bump transfers PWC to/from mat table, and short-sitting balance. Pt with ongoing max A needed for sit to stand transfers but with improved ability to maintain upright standing posture as session progresses with sheet around her hips to assist with hip extension and B knees blocked. She exhibits improved ability to perform bump transfers this date without use of slide board and just needs assist with repositioning her BLE on foot plate of her PWC. Remainder of session focused on short sitting balance reaching outside BOS, pt utilizes UE bracing on mat table for balance recovery. She continues to benefit from skilled PT services to work on increased independent with bed mobility, with transfers, with standing, and with overall strengthening in order to improve her functional mobility. Pt has made progress towards 1/1 STG assessed this date, will assess remaining STG next visit. Continue POC.   OBJECTIVE IMPAIRMENTS: decreased balance, decreased endurance, decreased mobility, difficulty walking, decreased ROM, decreased strength, increased  edema, impaired perceived functional ability, increased muscle spasms, impaired flexibility, impaired sensation, impaired tone, impaired UE functional use, postural dysfunction, and pain.   ACTIVITY LIMITATIONS: carrying, lifting, bending, standing, stairs, transfers, bed mobility, continence, bathing, toileting, dressing, reach over head, and hygiene/grooming  PARTICIPATION LIMITATIONS: meal prep, cleaning, laundry, driving, shopping, community activity, and occupation  PERSONAL FACTORS: Age, Sex, Time since onset of injury/illness/exacerbation, and 1-2 comorbidities:   C7 ASIA C- incomplete quad w/ neurogenic bladder and bowel, HLD, Hx of skin cancerare also affecting patient's functional outcome.   REHAB POTENTIAL: Good  CLINICAL DECISION MAKING: Stable/uncomplicated  EVALUATION COMPLEXITY: High  PLAN:  PT FREQUENCY: 2x/week  PT DURATION: 8 weeks + 8 wks  PLANNED INTERVENTIONS: 02835- PT Re-evaluation, 97750- Physical Performance Testing, 97110-Therapeutic exercises, 97530- Therapeutic activity, 97112- Neuromuscular re-education, 97535- Self Care, 02859- Manual therapy, [phone]- Gait training, [phone]- Orthotic Initial, [phone]- Electrical stimulation (manual), [phone] (1-2 muscles), [phone] (3+ muscles)- Dry Needling, Patient/Family education, Balance training, Stair training, Taping, Joint mobilization, Scar mobilization, Compression bandaging, DME instructions, Wheelchair mobility training, Cryotherapy, and Moist heat  PLAN FOR NEXT SESSION: sit to stands, standing tolerance with stedy, in // bars, possibly with RW, core strengthening/stability, endurance; wants to have conversation about taking a break (3-6 months) after this POC near end of this POC, standing lateral weight shifts, mini-squats in standing?, Yoga block press-ups vs push-up blocks, long-sitting - unsupported for core work?, bump transfers with and without slide board - pt wants to work on this skill!, lateral leans, work on mini  squats in standing frame, prone UB strengthening, rolling L/R on mat and breaking down parts of rolling, ASSESS ROLLING STG    Waddell Southgate, PT Waddell Southgate, PT, DPT, CSRS   01/19/2024, 11:01 AM
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
## 2024-01-23 ENCOUNTER — Ambulatory Visit: Payer: Self-pay | Admitting: Internal Medicine

## 2024-01-23 NOTE — Progress Notes (Signed)
 So last months lfts were off  again but not a lot  Iron shows normal iron elevated ferritin( inflammation marker in addition to iron related and  saturtation ratio normal .....  I suppose stop if taking iron supplements  iron and see if can maintain   3 mos lfts and ferritin ibc panel

## 2024-01-23 NOTE — Telephone Encounter (Signed)
 Apologies for late reply  see result note  and plan for repeat of lfts and ibc ferritin iron panel around October

## 2024-01-24 ENCOUNTER — Ambulatory Visit: Admitting: Occupational Therapy

## 2024-01-24 ENCOUNTER — Encounter: Payer: Self-pay | Admitting: Physical Therapy

## 2024-01-24 ENCOUNTER — Ambulatory Visit: Admitting: Physical Therapy

## 2024-01-24 ENCOUNTER — Encounter: Payer: Self-pay | Admitting: Occupational Therapy

## 2024-01-24 DIAGNOSIS — M24541 Contracture, right hand: Secondary | ICD-10-CM | POA: Diagnosis not present

## 2024-01-24 DIAGNOSIS — R29818 Other symptoms and signs involving the nervous system: Secondary | ICD-10-CM

## 2024-01-24 DIAGNOSIS — M25641 Stiffness of right hand, not elsewhere classified: Secondary | ICD-10-CM

## 2024-01-24 DIAGNOSIS — R278 Other lack of coordination: Secondary | ICD-10-CM | POA: Diagnosis not present

## 2024-01-24 DIAGNOSIS — R293 Abnormal posture: Secondary | ICD-10-CM | POA: Diagnosis not present

## 2024-01-24 DIAGNOSIS — G8254 Quadriplegia, C5-C7 incomplete: Secondary | ICD-10-CM | POA: Diagnosis not present

## 2024-01-24 DIAGNOSIS — R29898 Other symptoms and signs involving the musculoskeletal system: Secondary | ICD-10-CM

## 2024-01-24 DIAGNOSIS — M25642 Stiffness of left hand, not elsewhere classified: Secondary | ICD-10-CM

## 2024-01-24 DIAGNOSIS — M6281 Muscle weakness (generalized): Secondary | ICD-10-CM
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
## 2024-01-24 NOTE — Therapy (Signed)
 OUTPATIENT PHYSICAL THERAPY NEURO TREATMENT   Patient Name: [name] MRN: [id number] DOB:Jun 20, 1950, 73 y.o., female Today's Date: 01/24/2024   PCP: [name], MD REFERRING PROVIDER: [name], MD   END OF SESSION:   PT End of Session - 01/24/24 1019     Visit Number 34    Number of Visits 45   29 + 16 at re-cert 7/9   Date for PT Re-Evaluation 03/02/24   to allow for scheduling delays/known upcoming pt conflicts   Authorization Type [employer] MEDICARE    PT Start Time 1016    PT Stop Time 1104    PT Time Calculation (min) 48 min    Equipment Utilized During Treatment --    Activity Tolerance Patient tolerated treatment well    Behavior During Therapy [facility] for tasks assessed/performed             Past Medical History:  Diagnosis Date   Arthritis 2019   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 Aneita   Fibroid 2004   Per Dr. Lenon   History of shingles    face and mouth   Hx of skin cancer, basal cell    Hyperlipidemia 2022   lower legs and feet   Rosacea    Sciatica of left side 09/28/2013   Scoliosis    noted on mri done for back pain   Past Surgical History:  Procedure Laterality Date   BUNIONECTOMY     SPINE SURGERY  07/28/20   fused C5-C6   Patient Active Problem List   Diagnosis Date Noted   Hematuria 12/28/2023   Medication management 12/26/2023   Buttock wound, left, subsequent encounter 03/16/2023   Bronchiectasis with acute exacerbation (HCC) 03/15/2023   Buttock wound, left, initial encounter 11/02/2022   Orthostatic hypotension 08/13/2022   Neurogenic bowel 05/03/2022   Spasticity 05/03/2022   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/03/2022   Nerve pain 05/03/2022   Medication monitoring encounter 01/08/2022   Neurogenic bladder 10/11/2021   Urinary incontinence 10/11/2021   ESBL (extended spectrum beta-lactamase) producing bacteria infection  10/09/2021   Recurrent UTI 10/09/2021   Quadriplegia, C5-C7 incomplete (HCC) 01/16/2021   History of spinal fracture 01/16/2021   Suprapubic catheter (HCC) 01/16/2021   Encounter for routine gynecological examination 09/28/2013   Onychomycosis 09/28/2013   Foot deformity, acquired 03/26/2012   Encounter for preventive health examination 12/25/2010   ROSACEA 08/25/2009   Disturbance in sleep behavior 03/11/2008   SKIN CANCER, HX OF 03/11/2008   DYSURIA, HX OF 03/11/2008   Hyperlipidemia 02/10/2007   CERVICALGIA 02/10/2007    ONSET DATE: 08/29/2023 (referral date)  REFERRING DIAG: G82.54 (ICD-10-CM) - Incomplete quadriplegia at C5-C8 level (HCC)  THERAPY DIAG:  Other lack of coordination  Muscle weakness (generalized)  Other symptoms and signs involving the nervous system  Other symptoms and signs involving the musculoskeletal system  Rationale for Evaluation and Treatment: Rehabilitation  SUBJECTIVE:  SUBJECTIVE STATEMENT:  Pt received seated in her PWC, denies any acute changes since last visit.   From initial eval: Pt familiar to this clinic, last seen Oct-Nov 2024 but has been seen for multiple POCs since her initial injury in 2022. Pt returns to this clinic after being in [place]  for the past few months. While in [place]  in December 2024 patient had a fall where she slid forwards out of her wheelchair onto the floor, ended up fracturing both of her femurs. Pt was hospitalized for 10-11 days and had surgical repair of her femurs. Pt reports she has been cleared of all restrictions since surgery, does have ongoing swelling in both legs and worsened spasticity. Pt reports that the spasticity has slightly improved since it initially started, was told by Dr. Lovorn the swelling may not resolve  for 6-8 months (it has been 3 months) and not sure if spasticity will resolve but patient is hopeful that if her swelling improves her spasticity will improve as well.  Pt asking about other options to help manage the swelling her legs, has tried variable compression stockings (knee high) and her PTs in [place]  recommended tight shapewear. Pt reports that the knee-high compression stockings led to increased swelling in her knees and the shapewear did not help her swelling. Encouraged patient to get thigh-high compression stockings and elevate her LE in PWC. Pt has ordered an articulating bed that will get here next Monday (3/31) so she can better elevate her legs when in bed.  Pt is also concerned about decreased ROM in her legs, [name] works on stretching her legs frequently but she feels she has more motion in her LLE as compared to RLE and may have mild foot drop on her R side. Pt does have PRAFOs to wear at night. Pt has worked up to standing in her standing frame x 30-40 min at a time. Pt also worked on standing with her PT and in // bars in [place] . Pt also reports with her injuries she lost the ability to lock/unlock her R knee but that it is getting better. She reports she lost a lot of stamina during her hospital stay as well.  Pt also has a new wound since last seen in this clinic in her L gluteal fold, shearing injury. Pt was seeing wound care in [place]  and is scheduled to see wound care with [facility] early April (was not able to schedule with [facility] wound care until late April).  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 Yes: NPRS scale: 3-4 Pain location: elbows to fingertips on both arms Pain description: nerve pain Aggravating factors: not stated Relieving factors: not stated  PRECAUTIONS: Fall and Other: osteoporosis  RED FLAGS: None   WEIGHT BEARING  RESTRICTIONS: No  FALLS: Has patient fallen in last 6 months? Yes. Number of falls 1 fall in [place]  that resulted in B femur fractures  LIVING ENVIRONMENT: Lives with: lives with their spouse and and with full-time caregiver [name] Lives in: House/apartment Home is power wheelchair accessible Has following equipment at home: Wheelchair (power), Wheelchair (manual), Grab bars, Ramped entry, and standing frame, slide board  PLOF: Independent with household mobility with device, Independent with community mobility with device, Requires assistive device for independence, Needs assistance with ADLs, and Needs assistance with transfers  PATIENT GOALS: still working on stand and pivot with goal  to pivot to commode or to a chair work on core-will help me with standing improve my stamina - being in the hospital I lost strength/endurance   OBJECTIVE:  Note: Objective measures were completed at Evaluation unless otherwise noted.  DIAGNOSTIC FINDINGS: None update/relevant to this POC  COGNITION: Overall cognitive status: Within functional limits for tasks assessed   SENSATION: Decreased sensation in BUE and BLE secondary to incomplete quadriplegia Decreased sensation in proximal LLE as compared to distal LE  EDEMA:  Circumferential: R knee: 17; L knee: 17.5 and Figure 8: R ankle 21, L ankle 21.5  MUSCLE TONE: increased spasticity in BLE   POSTURE: rounded shoulders and forward head  LOWER EXTREMITY ROM:     Passive  Right Eval Left Eval  Hip flexion Tight hip flexors Tight hip flexors  Hip extension    Hip abduction    Hip adduction    Hip internal rotation    Hip external rotation    Knee flexion Tight HS Tight HS  Knee extension    Ankle dorsiflexion Decreased, tight gastroc Decreased, tight gastroc  Ankle plantarflexion    Ankle inversion    Ankle eversion     (Blank rows = not tested)  LOWER EXTREMITY MMT:    MMT Right Eval Left Eval  Hip flexion 1 2-  Hip  extension    Hip abduction    Hip adduction    Hip internal rotation    Hip external rotation    Knee flexion 0 3  Knee extension 2- 2-  Ankle dorsiflexion 2- 3  Ankle plantarflexion    Ankle inversion    Ankle eversion    (Blank rows = not tested)  BED MOBILITY:  From previous POC: Sit to supine Mod A Supine to sit Mod A Rolling to Right Mod A Rolling to Left Mod A Undulating mattress for wound management on standard bed (elevated-so often doing uphill sliding board transfers); she would like to continue working on sitting up independently, she has been working on rolling, needs less assistance w/ this when someone props her leg into hooklying; would like something to help her pull her left leg to her butt for stretching as well as bed mobility.  TRANSFERS: From previous POC: Pt continues using combination of bump over, slide board, and depression (squat pivot) transfers.                                                                                                                              TREATMENT:   NMR Slideboard transfer to left PWC to mat minA for board placement (pt directs preferred positioning), LE positioning and cath bag management. MaxA to transition to supine and adjust on mat Assessed rolling: -x3 attempts to roll to right w/ minA for LLE placement, pt able to achieve momentum ind on last attempt -x1 attempt to roll to left onto back then again onto left side w/ minA for RLE positioning for supine  to side-lying roll Had pt roll back into supine for stretching and PROM of BLE:  flexion of knee and hip, DF, IR/ER at various angles Mod-maxA to roll and position in prone w/ pillow under torso for PROM and gentle stretching of quads, hip flexors, IR/ER, into DF Practiced prone prop on elbows w/ modA to position > scapular protraction several reps > walking BUE forward then returning to prop position modA for chest and core support > prone w/ unilateral UE push as if  to initiate roll onto side several reps to fatigue each side (RUE somewhat stronger with increased ROM into extension/rotation) > returned to elbow prop modA w/ repeated round of scapular protraction  pt needs prone recovery breaks throughout and increased time to direct care and improve positioning prior to each task  Slide board transfer back to PWC to right on slight decline with min A for board placement, LE management and cath bag positioning.  Pt left seated in her PWC with [name] present to assist with repositioning and clothing management.  PT handoff to OT for following session.  PATIENT EDUCATION: Education details: Continue stretching program and UB strengthening exercises.  Discussed re-cert (this PT unsure - will discuss with team) Person educated: Patient and Caregiver [name] Education method: Explanation, Demonstration, Tactile cues, and Verbal cues Education comprehension: verbalized understanding and returned demonstration  HOME EXERCISE PROGRAM: Access Code: [email or web address] URL: [email or web address] Date: 11/22/2023 Prepared by: Waddell Southgate  Exercises - Seated Elbow Extension with Self-Anchored Resistance  - 1 x daily - 7 x weekly - 3 sets - 10 reps - Supine Elbow Flexion Extension with Dumbbell  - 1 x daily - 7 x weekly - 3 sets - 10 reps - Seated Single Arm Elbow Extension Push-up on Table  - 1 x daily - 7 x weekly - 3 sets - 10 reps - Wheelchair Push-Up (AKA)  - 1 x daily - 7 x weekly - 3 sets - 5 reps - Seated Biceps Curl  - 1 x daily - 7 x weekly - 3 sets - 10 reps - Forearm Pronation and Supination with Hammer  - 1 x daily - 7 x weekly - 3 sets - 10 reps - Seated Wrist Extension with Dumbbell  - 1 x daily - 7 x weekly - 3 sets - 10 reps  GOALS: Goals reviewed with patient? Yes  GOALS (at 7/9 re-cert): Goals reviewed with patient? Yes  SHORT TERM GOALS:   Target date: 01/20/2024  Pt will perform rolling left and right from supine at no more than minA in  order to demonstrate progression back to PLOF. Baseline: mod-maxA over recent 2 weeks (7/9); minA (8/12) Goal status: MET  2.  Pt will be able to reach outside of short sitting BOS and recover at no more than mod I level to demonstrate functional core strengthening. Baseline: min-modA (7/9), min A (8/7) Goal status: IN PROGRESS  LONG TERM GOALS:  Target date: 02/17/2024  Pt will perform sit to stand transfer with LRAD with min A Baseline: max A to stedy (4/4), max A in // bars (4/25), max A in // bars (5/22), max A to +2 in // bars (6/18); modA (7/9) Goal status: ONGOING  2.  Pt to tolerate standing x 6 min using upright posture with LRAD to demonstrate improved endurance. Baseline: 30 sec in stedy (initial), 2:35 min in // bars (4/25), 3 min in // bars (5/22), 4 min in // bars (6/18); 5 minutes 4 seconds in // bars (  7/9) Goal status: ONGOING  3.  Pt will be able to obtain static long sitting position with no more than minA in order to promote improved functional positioning at home. Baseline: modA for trunk and LE support into forward positioning (7/9) Goal status: INITIAL  ASSESSMENT:  CLINICAL IMPRESSION: Emphasis of skilled session today on addressing PROM and stretching, rolling, and prone strengthening.  She has more difficulty rolling to right than left today and demonstrates increased RUE strength in prone positioning tasks.  She feels her strength in prone has wavered since a couple of weeks ago.  This PT is uncertain why fluctuation has occurred as no significant acute medical exacerbation or MOI known at present time, but curious if this is new baseline for pt vs acute issue and pt may be at a functional plateau.  Will continue to address and follow-up per POC.  PT team to determine need for re-cert.  OBJECTIVE IMPAIRMENTS: decreased balance, decreased endurance, decreased mobility, difficulty walking, decreased ROM, decreased strength, increased edema, impaired perceived  functional ability, increased muscle spasms, impaired flexibility, impaired sensation, impaired tone, impaired UE functional use, postural dysfunction, and pain.   ACTIVITY LIMITATIONS: carrying, lifting, bending, standing, stairs, transfers, bed mobility, continence, bathing, toileting, dressing, reach over head, and hygiene/grooming  PARTICIPATION LIMITATIONS: meal prep, cleaning, laundry, driving, shopping, community activity, and occupation  PERSONAL FACTORS: Age, Sex, Time since onset of injury/illness/exacerbation, and 1-2 comorbidities:   C7 ASIA C- incomplete quad w/ neurogenic bladder and bowel, HLD, Hx of skin cancerare also affecting patient's functional outcome.   REHAB POTENTIAL: Good  CLINICAL DECISION MAKING: Stable/uncomplicated  EVALUATION COMPLEXITY: High  PLAN:  PT FREQUENCY: 2x/week  PT DURATION: 8 weeks + 8 wks  PLANNED INTERVENTIONS: 02835- PT Re-evaluation, 97750- Physical Performance Testing, 97110-Therapeutic exercises, 97530- Therapeutic activity, 97112- Neuromuscular re-education, 97535- Self Care, 02859- Manual therapy, [phone]- Gait training, [phone]- Orthotic Initial, [phone]- Electrical stimulation (manual), [phone] (1-2 muscles), [id number] (3+ muscles)- Dry Needling, Patient/Family education, Balance training, Stair training, Taping, Joint mobilization, Scar mobilization, Compression bandaging, DME instructions, Wheelchair mobility training, Cryotherapy, and Moist heat  PLAN FOR NEXT SESSION: sit to stands, standing tolerance with stedy, in // bars, possibly with RW, core strengthening/stability, endurance; wants to have conversation about taking a break (3-6 months) after this POC near end of this POC, standing lateral weight shifts, mini-squats in standing?, Yoga block press-ups vs push-up blocks, long-sitting - unsupported for core work?, bump transfers with and without slide board - pt wants to work on this skill!, lateral leans, work on mini squats in standing frame,  prone UB strengthening, rolling L/R on mat and breaking down parts of rolling  Re-cert at last scheduled visit - taper to 1x/wk for month of September to access ability to maintain progress?  Daved KATHEE Bull, PT, DPT   01/24/2024, 1:55 PM

## 2024-01-25 ENCOUNTER — Other Ambulatory Visit: Payer: Self-pay | Admitting: "Endocrinology

## 2024-01-25 ENCOUNTER — Ambulatory Visit: Admitting: Physical Therapy

## 2024-01-25 ENCOUNTER — Ambulatory Visit: Admitting: Occupational Therapy

## 2024-01-25 ENCOUNTER — Other Ambulatory Visit

## 2024-01-25 DIAGNOSIS — M6281 Muscle weakness (generalized): Secondary | ICD-10-CM

## 2024-01-25 DIAGNOSIS — R29818 Other symptoms and signs involving the nervous system: Secondary | ICD-10-CM

## 2024-01-25 DIAGNOSIS — M24541 Contracture, right hand: Secondary | ICD-10-CM | POA: Diagnosis not present

## 2024-01-25 DIAGNOSIS — R29898 Other symptoms and signs involving the musculoskeletal system: Secondary | ICD-10-CM | POA: Diagnosis not present

## 2024-01-25 DIAGNOSIS — M25641 Stiffness of right hand, not elsewhere classified: Secondary | ICD-10-CM

## 2024-01-25 DIAGNOSIS — R293 Abnormal posture: Secondary | ICD-10-CM

## 2024-01-25 DIAGNOSIS — G8254 Quadriplegia, C5-C7 incomplete: Secondary | ICD-10-CM | POA: Diagnosis not present

## 2024-01-25 DIAGNOSIS — M25642 Stiffness of left hand, not elsewhere classified: Secondary | ICD-10-CM | POA: Diagnosis not present

## 2024-01-25 DIAGNOSIS — R278 Other lack of coordination: Secondary | ICD-10-CM

## 2024-01-25 NOTE — Therapy (Signed)
 OUTPATIENT PHYSICAL THERAPY NEURO TREATMENT   Patient Name: [name] MRN: [id number] DOB:04-Jun-1951, 73 y.o., female Today's Date: 01/25/2024   PCP: [name], MD REFERRING PROVIDER: [name], MD   END OF SESSION:   PT End of Session - 01/25/24 1446     Visit Number 35    Number of Visits 45   29 + 16 at re-cert 7/9   Date for PT Re-Evaluation 03/02/24   to allow for scheduling delays/known upcoming pt conflicts   Authorization Type [employer] MEDICARE    PT Start Time 1445    PT Stop Time 1530    PT Time Calculation (min) 45 min    Activity Tolerance Patient tolerated treatment well    Behavior During Therapy [facility] for tasks assessed/performed              Past Medical History:  Diagnosis Date   Arthritis 2019   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 Aneita   Fibroid 2004   Per Dr. Lenon   History of shingles    face and mouth   Hx of skin cancer, basal cell    Hyperlipidemia 2022   lower legs and feet   Rosacea    Sciatica of left side 09/28/2013   Scoliosis    noted on mri done for back pain   Past Surgical History:  Procedure Laterality Date   BUNIONECTOMY     SPINE SURGERY  07/28/20   fused C5-C6   Patient Active Problem List   Diagnosis Date Noted   Hematuria 12/28/2023   Medication management 12/26/2023   Buttock wound, left, subsequent encounter 03/16/2023   Bronchiectasis with acute exacerbation (HCC) 03/15/2023   Buttock wound, left, initial encounter 11/02/2022   Orthostatic hypotension 08/13/2022   Neurogenic bowel 05/03/2022   Spasticity 05/03/2022   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/03/2022   Nerve pain 05/03/2022   Medication monitoring encounter 01/08/2022   Neurogenic bladder 10/11/2021   Urinary incontinence 10/11/2021   ESBL (extended spectrum beta-lactamase) producing bacteria infection 10/09/2021   Recurrent UTI 10/09/2021    Quadriplegia, C5-C7 incomplete (HCC) 01/16/2021   History of spinal fracture 01/16/2021   Suprapubic catheter (HCC) 01/16/2021   Encounter for routine gynecological examination 09/28/2013   Onychomycosis 09/28/2013   Foot deformity, acquired 03/26/2012   Encounter for preventive health examination 12/25/2010   ROSACEA 08/25/2009   Disturbance in sleep behavior 03/11/2008   SKIN CANCER, HX OF 03/11/2008   DYSURIA, HX OF 03/11/2008   Hyperlipidemia 02/10/2007   CERVICALGIA 02/10/2007    ONSET DATE: 08/29/2023 (referral date)  REFERRING DIAG: G82.54 (ICD-10-CM) - Incomplete quadriplegia at C5-C8 level (HCC)  THERAPY DIAG:  Muscle weakness (generalized)  Other symptoms and signs involving the nervous system  Other symptoms and signs involving the musculoskeletal system  Quadriplegia, C5-C7 incomplete (HCC)  Abnormal posture  Rationale for Evaluation and Treatment: Rehabilitation  SUBJECTIVE:  SUBJECTIVE STATEMENT:  Pt received seated in her PWC, denies any acute changes since last visit.   From initial eval: Pt familiar to this clinic, last seen Oct-Nov 2024 but has been seen for multiple POCs since her initial injury in 2022. Pt returns to this clinic after being in [place]  for the past few months. While in [place]  in December 2024 patient had a fall where she slid forwards out of her wheelchair onto the floor, ended up fracturing both of her femurs. Pt was hospitalized for 10-11 days and had surgical repair of her femurs. Pt reports she has been cleared of all restrictions since surgery, does have ongoing swelling in both legs and worsened spasticity. Pt reports that the spasticity has slightly improved since it initially started, was told by Dr. Lovorn the swelling may not resolve for 6-8  months (it has been 3 months) and not sure if spasticity will resolve but patient is hopeful that if her swelling improves her spasticity will improve as well.  Pt asking about other options to help manage the swelling her legs, has tried variable compression stockings (knee high) and her PTs in [place]  recommended tight shapewear. Pt reports that the knee-high compression stockings led to increased swelling in her knees and the shapewear did not help her swelling. Encouraged patient to get thigh-high compression stockings and elevate her LE in PWC. Pt has ordered an articulating bed that will get here next Monday (3/31) so she can better elevate her legs when in bed.  Pt is also concerned about decreased ROM in her legs, [name] but she feels she has more motion in her LLE as compared to RLE and may have mild foot drop on her R side. Pt does have PRAFOs to wear at night. Pt has worked up to standing in her standing frame x 30-40 min at a time. Pt also worked on standing with her PT and in // bars in [place] . Pt also reports with her injuries she lost the ability to lock/unlock her R knee but that it is getting better. She reports she lost a lot of stamina during her hospital stay as well.  Pt also has a new wound since last seen in this clinic in her L gluteal fold, shearing injury. Pt was seeing wound care in [place]  and is scheduled to see wound care with [facility] early April (was not able to schedule with [facility] wound care until late April).  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 Yes: NPRS scale: 3-4 Pain location: elbows to fingertips on both arms Pain description: nerve pain Aggravating factors: not stated Relieving factors: not stated  PRECAUTIONS: Fall and Other: osteoporosis  RED FLAGS: None   WEIGHT BEARING  RESTRICTIONS: No  FALLS: Has patient fallen in last 6 months? Yes. Number of falls 1 fall in [place]  that resulted in B femur fractures  LIVING ENVIRONMENT: Lives with: lives with their spouse and and with full-time caregiver [name] Lives in: House/apartment Home is power wheelchair accessible Has following equipment at home: Wheelchair (power), Wheelchair (manual), Grab bars, Ramped entry, and standing frame, slide board  PLOF: Independent with household mobility with device, Independent with community mobility with device, Requires assistive device for independence, Needs assistance with ADLs, and Needs assistance with transfers  PATIENT GOALS: still working on stand and pivot with goal  to pivot to commode or to a chair work on core-will help me with standing improve my stamina - being in the hospital I lost strength/endurance   OBJECTIVE:  Note: Objective measures were completed at Evaluation unless otherwise noted.  DIAGNOSTIC FINDINGS: None update/relevant to this POC  COGNITION: Overall cognitive status: Within functional limits for tasks assessed   SENSATION: Decreased sensation in BUE and BLE secondary to incomplete quadriplegia Decreased sensation in proximal LLE as compared to distal LE  EDEMA:  Circumferential: R knee: 17; L knee: 17.5 and Figure 8: R ankle 21, L ankle 21.5  MUSCLE TONE: increased spasticity in BLE   POSTURE: rounded shoulders and forward head  LOWER EXTREMITY ROM:     Passive  Right Eval Left Eval  Hip flexion Tight hip flexors Tight hip flexors  Hip extension    Hip abduction    Hip adduction    Hip internal rotation    Hip external rotation    Knee flexion Tight HS Tight HS  Knee extension    Ankle dorsiflexion Decreased, tight gastroc Decreased, tight gastroc  Ankle plantarflexion    Ankle inversion    Ankle eversion     (Blank rows = not tested)  LOWER EXTREMITY MMT:    MMT Right Eval Left Eval  Hip flexion 1 2-  Hip  extension    Hip abduction    Hip adduction    Hip internal rotation    Hip external rotation    Knee flexion 0 3  Knee extension 2- 2-  Ankle dorsiflexion 2- 3  Ankle plantarflexion    Ankle inversion    Ankle eversion    (Blank rows = not tested)  BED MOBILITY:  From previous POC: Sit to supine Mod A Supine to sit Mod A Rolling to Right Mod A Rolling to Left Mod A Undulating mattress for wound management on standard bed (elevated-so often doing uphill sliding board transfers); she would like to continue working on sitting up independently, she has been working on rolling, needs less assistance w/ this when someone props her leg into hooklying; would like something to help her pull her left leg to her butt for stretching as well as bed mobility.  TRANSFERS: From previous POC: Pt continues using combination of bump over, slide board, and depression (squat pivot) transfers.                                                                                                                              TREATMENT:   NMR Pt received seated in her PWC, slide board transfer PWC to mat table with min A Session focus on short-sitting balance EOM with 4 step under BLE Reaching outside BOS and across midline for bean bags and tossing them to color-coordinated targets Pt needs min A to mod A for sitting balance to prevent a fall 2# weighted dowel chest press 3 x 10 reps Initial set has difficulty pushing outside BOS  2nd set improved but does have one LOB posteriorly, needs min A for sitting balance Up to min A for sitting balance during 3rd set Slideboard transfer back to [facility] with min A  Pt left seated in her PWC with [name] present to assist with repositioning and clothing management.  PT handoff to OT for following session.  Initiated discussion about PT POC and recommending tapering down to scheduling 1x/week for PT for month of September due to fluctuations in function as well as needing  to be able to show progress from an insurance perspective. Pt requesting to remain at 2x/week for PT for month of September with justification that she has lost function due to the adverse effects of her last Botox  injection which affected her BUE strength (Botox  was 09/28/23). Will discuss with other PT on treatment team and discuss POC again next visit.  PATIENT EDUCATION: Education details: Continue stretching program and UB strengthening exercises.  Discussed re-cert (this PT unsure - will discuss with team) Person educated: Patient and Caregiver [name] Education method: Explanation, Demonstration, Tactile cues, and Verbal cues Education comprehension: verbalized understanding and returned demonstration  HOME EXERCISE PROGRAM: Access Code: [email or web address] URL: [email or web address] Date: 11/22/2023 Prepared by: Waddell Southgate  Exercises - Seated Elbow Extension with Self-Anchored Resistance  - 1 x daily - 7 x weekly - 3 sets - 10 reps - Supine Elbow Flexion Extension with Dumbbell  - 1 x daily - 7 x weekly - 3 sets - 10 reps - Seated Single Arm Elbow Extension Push-up on Table  - 1 x daily - 7 x weekly - 3 sets - 10 reps - Wheelchair Push-Up (AKA)  - 1 x daily - 7 x weekly - 3 sets - 5 reps - Seated Biceps Curl  - 1 x daily - 7 x weekly - 3 sets - 10 reps - Forearm Pronation and Supination with Hammer  - 1 x daily - 7 x weekly - 3 sets - 10 reps - Seated Wrist Extension with Dumbbell  - 1 x daily - 7 x weekly - 3 sets - 10 reps  GOALS: Goals reviewed with patient? Yes  GOALS (at 7/9 re-cert): Goals reviewed with patient? Yes  SHORT TERM GOALS:   Target date: 01/20/2024  Pt will perform rolling left and right from supine at no more than minA in order to demonstrate progression back to PLOF. Baseline: mod-maxA over recent 2 weeks (7/9); minA (8/12) Goal status: MET  2.  Pt will be able to reach outside of short sitting BOS and recover at no more than mod I level to demonstrate  functional core strengthening. Baseline: min-modA (7/9), min A (8/7) Goal status: IN PROGRESS  LONG TERM GOALS:  Target date: 02/17/2024  Pt will perform sit to stand transfer with LRAD with min A Baseline: max A to stedy (4/4), max A in // bars (4/25), max A in // bars (5/22), max A to +2 in // bars (6/18); modA (7/9) Goal status: ONGOING  2.  Pt to tolerate standing x 6 min using upright posture with LRAD to demonstrate improved endurance. Baseline: 30 sec in stedy (initial), 2:35 min in // bars (4/25), 3 min in // bars (5/22), 4 min in // bars (6/18); 5 minutes 4 seconds in // bars (7/9) Goal status: ONGOING  3.  Pt will be able to obtain static long sitting position with no more than minA in order to promote improved functional positioning at home. Baseline: modA for trunk and LE support into  forward positioning (7/9) Goal status: INITIAL  ASSESSMENT:  CLINICAL IMPRESSION: Emphasis of skilled session today on continuing to work on short-sitting balance and reaching outside BOS. Pt does struggle with BUE seated balance task without being able to use her UE for support but exhibits improved performance as session progresses. She is hesitant to decrease frequency to 1x/week next month and is concerned about increased UE weakness from her Botox  injection on 09/28/23. Pt does plan to have more Botox  done on 01/30/24 but says they will target different muscles and do a different amount so as not to affect her function as much. Will continue discussion about PT POC for September next visit. Continue POC.  OBJECTIVE IMPAIRMENTS: decreased balance, decreased endurance, decreased mobility, difficulty walking, decreased ROM, decreased strength, increased edema, impaired perceived functional ability, increased muscle spasms, impaired flexibility, impaired sensation, impaired tone, impaired UE functional use, postural dysfunction, and pain.   ACTIVITY LIMITATIONS: carrying, lifting, bending, standing,  stairs, transfers, bed mobility, continence, bathing, toileting, dressing, reach over head, and hygiene/grooming  PARTICIPATION LIMITATIONS: meal prep, cleaning, laundry, driving, shopping, community activity, and occupation  PERSONAL FACTORS: Age, Sex, Time since onset of injury/illness/exacerbation, and 1-2 comorbidities:   C7 ASIA C- incomplete quad w/ neurogenic bladder and bowel, HLD, Hx of skin cancerare also affecting patient's functional outcome.   REHAB POTENTIAL: Good  CLINICAL DECISION MAKING: Stable/uncomplicated  EVALUATION COMPLEXITY: High  PLAN:  PT FREQUENCY: 2x/week  PT DURATION: 8 weeks + 8 wks  PLANNED INTERVENTIONS: 02835- PT Re-evaluation, 97750- Physical Performance Testing, 97110-Therapeutic exercises, 97530- Therapeutic activity, 97112- Neuromuscular re-education, 97535- Self Care, 02859- Manual therapy, [phone]- Gait training, [phone]- Orthotic Initial, [phone]- Electrical stimulation (manual), [phone] (1-2 muscles), [phone] (3+ muscles)- Dry Needling, Patient/Family education, Balance training, Stair training, Taping, Joint mobilization, Scar mobilization, Compression bandaging, DME instructions, Wheelchair mobility training, Cryotherapy, and Moist heat  PLAN FOR NEXT SESSION: sit to stands, standing tolerance with stedy, in // bars, possibly with RW, core strengthening/stability, endurance; wants to have conversation about taking a break (3-6 months) after this POC near end of this POC, standing lateral weight shifts, mini-squats in standing?, Yoga block press-ups vs push-up blocks, long-sitting - unsupported for core work?, bump transfers with and without slide board - pt wants to work on this skill!, lateral leans, work on mini squats in standing frame, prone UB strengthening, rolling L/R on mat and breaking down parts of rolling  Re-cert at last scheduled visit - taper to 1x/wk for month of September to access ability to maintain progress vs can justify 2x/week for September  if we are working towards a d/c plan  Waddell Southgate, PT Waddell Southgate, PT, DPT, CSRS    01/25/2024, 3:31 PM
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
## 2024-01-26 ENCOUNTER — Encounter: Admitting: Occupational Therapy

## 2024-01-26 ENCOUNTER — Ambulatory Visit: Admitting: Physical Therapy

## 2024-01-30 ENCOUNTER — Encounter: Payer: Self-pay | Admitting: Physical Medicine and Rehabilitation

## 2024-01-30 ENCOUNTER — Encounter: Attending: Physical Medicine and Rehabilitation | Admitting: Physical Medicine and Rehabilitation

## 2024-01-30 VITALS — BP 106/67 | HR 72 | Ht 64.0 in | Wt 126.0 lb

## 2024-01-30 DIAGNOSIS — R252 Cramp and spasm: Secondary | ICD-10-CM | POA: Diagnosis not present

## 2024-01-30 MED ORDER — SODIUM CHLORIDE (PF) 0.9 % IJ SOLN
1.0000 mL | Freq: Once | INTRAMUSCULAR | Status: AC
  Administered 2024-01-30: 1 mL

## 2024-01-30 MED ORDER — ONABOTULINUMTOXINA 100 UNITS IJ SOLR
300.0000 [IU] | Freq: Once | INTRAMUSCULAR | Status: AC
  Administered 2024-01-30: 300 [IU] via INTRAMUSCULAR

## 2024-01-30 NOTE — Patient Instructions (Signed)
 I will pop in during your visit with Dr. Cornelio October 1st to see effects; follow up for injections in 3 months

## 2024-01-30 NOTE — Progress Notes (Signed)
 [name] is a 74 y.o. year old female  who  has a past medical history of Arthritis (2019), CERVICAL POLYP (03/11/2008), Colon polyps (2005), Fibroid (2004), History of shingles, skin cancer, basal cell, Hyperlipidemia (2022), Rosacea, Sciatica of left side (09/28/2013), and Scoliosis.   They are presenting to PM&R clinic for follow up related to hand spasticity .  Exam:   Tone: MAS 2 bilateral PIPS and DIPs, sparing thumb - unchanged   Strength: LUE - 3-4/5 grip, 3/5 FA, 2/5 WE; 5-/5 EF, EE, SA RUE - 1-2 grip, 0/5 FA, 2/5 WE, 5-/5 EF, EE, SA     Botolulinum Toxin Injection: [x ] BOTOX  (onabotulinumtoxinA ) [_] DYSPORT (abobotulinumtoxinA) [_] Xeomin (Incobotulinum toxin A)   Goals with treatment: [x ] Decrease spasms/ abnormal movements [ x ] Improve Active / Passive ROM [x ] Improve ADLs [ x] Improve functional mobility [ ]  Improve gait mechanics [ x] Improve positioning/posture [ x] Prevent contracture  [x ] Prevent joint destruction [ ]  Prevent skin breakdown [ ]  Decrease caregiver burden [ ]  Improve hygiene [x ] Improve Pain   MEDICATION:  ONAbotulinum toxin. 300 Units     NaCl 3 ml       CONSENT: Obtained in writing followed by time-out per policy. Consent uploaded to chart.   Benefits discussed included, but were not limited to, decreased muscle tightness and spasticity, increased joint range of motion, improved limb positioning and facilitation of hygiene and nursing care.    Risks discussed included, but were not limited to, pain and discomfort, bleeding, bruising, excessive weakness, venous thrombosis, muscle atrophy, and distant spread of toxin which could include generalized muscle weakness, diplopia, blurred vision, ptosis, dysphagia, dysphonia, dysarthria, urinary incontinence and breathing difficulties. These symptoms have been reported hours to weeks after injection. Swallowing and breathing difficulties may be life threatening, and there have been  reports of death. Patient/Family member/Guardian/Caregiver have been offered botulinum toxin informational material upon initial consultation and this information has been continually available. All questions answered to patient/family member/guardian/ caregiver satisfaction. They would like to proceed with procedure. There are no noted contraindications to procedure.   PROCEDURE [x]  Without Ultrasound: Patient was placed in a position with the appropriate muscles exposed, located and identified. The skin was cleaned with ChloraPrep and ethyl chloride was sprayed for topical anesthetic. Using combination EMG amplification and electrical stimulation, the following muscles were identified using anatomical landmarks described by Perotto, et al (1994) and injected following aspiration to ensure blood vessels were avoided.   [_ ] With Ultrasound: Patient was placed in a position with the appropriate muscles exposed, located and identified. The skin was cleaned with ChloraPrep and ethyl chloride was sprayed for topical anesthetic. Using combination Ultrasound guidance for anatomical guidance, avoidance of significant vasculature, to decrease the risk of hematoma formation and ensure botox  was placed in the correct location; EMG amplification and electrical stimulation, the following muscles were identified and injected following aspiration to ensure blood vessels were avoided. A _linear transducer was used during the procedure. The botulinum toxin was visualized entering the appropriate musculature.    MUSCLE: Units /Sites   80  LUE --> 155 U FDS - 30 -> 50 U FDP -  50 FCU - 20 - stop Add palmaris longus  - 20 U Add lumbricals - 20 U (5 ea) Add Palmer interossie - 15 (5 ea)    100 RUE --> 135 U FDS - 30 -> 50 U FDP - 30 -> 50 U FCU - 20 -  stop  R lumbricals - 20 u (5 ea) Add Palmer interossei - 15 (5 ea)   290 units were injected without difficulty. No complications were encountered. The patient  tolerated the procedure well. Wasted 10     PLAN: - Resume Usual Activities. Notify Physician of any unusual bleeding, erythema or concern for side effects as reviewed above. - Apply ice prn for pain - Tylenol prn for pain - I will pop in during your visit with Dr. Cornelio October 1st to see effects; follow up for injections in 3 months

## 2024-01-31 ENCOUNTER — Encounter: Payer: Self-pay | Admitting: Physical Therapy

## 2024-01-31 ENCOUNTER — Ambulatory Visit: Admitting: Occupational Therapy

## 2024-01-31 ENCOUNTER — Ambulatory Visit: Admitting: Physical Therapy

## 2024-01-31 DIAGNOSIS — M6281 Muscle weakness (generalized): Secondary | ICD-10-CM

## 2024-01-31 DIAGNOSIS — M25642 Stiffness of left hand, not elsewhere classified: Secondary | ICD-10-CM | POA: Diagnosis not present

## 2024-01-31 DIAGNOSIS — G8254 Quadriplegia, C5-C7 incomplete: Secondary | ICD-10-CM

## 2024-01-31 DIAGNOSIS — M24541 Contracture, right hand: Secondary | ICD-10-CM | POA: Diagnosis not present

## 2024-01-31 DIAGNOSIS — R29818 Other symptoms and signs involving the nervous system: Secondary | ICD-10-CM

## 2024-01-31 DIAGNOSIS — R293 Abnormal posture: Secondary | ICD-10-CM

## 2024-01-31 DIAGNOSIS — R29898 Other symptoms and signs involving the musculoskeletal system: Secondary | ICD-10-CM

## 2024-01-31 DIAGNOSIS — M25641 Stiffness of right hand, not elsewhere classified: Secondary | ICD-10-CM | POA: Diagnosis not present

## 2024-01-31 DIAGNOSIS — R278 Other lack of coordination: Secondary | ICD-10-CM | POA: Diagnosis not present

## 2024-01-31 NOTE — Therapy (Signed)
 OUTPATIENT PHYSICAL THERAPY NEURO TREATMENT   Patient Name: [name] MRN: [id number] DOB:1950-06-27, 73 y.o., female Today's Date: 01/31/2024   PCP: [name], MD REFERRING PROVIDER: [name], MD   END OF SESSION:   PT End of Session - 01/31/24 1108     Visit Number [id number]    Number of Visits 45   29 + 16 at re-cert 7/9   Date for PT Re-Evaluation 03/02/24   to allow for scheduling delays/known upcoming pt conflicts   Authorization Type [employer] MEDICARE    PT Start Time 1106    PT Stop Time 1150    PT Time Calculation (min) 44 min    Activity Tolerance Patient tolerated treatment well    Behavior During Therapy [facility] for tasks assessed/performed              Past Medical History:  Diagnosis Date   Arthritis 2019   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 Aneita   Fibroid 2004   Per Dr. Lenon   History of shingles    face and mouth   Hx of skin cancer, basal cell    Hyperlipidemia 2022   lower legs and feet   Rosacea    Sciatica of left side 09/28/2013   Scoliosis    noted on mri done for back pain   Past Surgical History:  Procedure Laterality Date   BUNIONECTOMY     SPINE SURGERY  07/28/20   fused C5-C6   Patient Active Problem List   Diagnosis Date Noted   Hematuria 12/28/2023   Medication management 12/26/2023   Buttock wound, left, subsequent encounter 03/16/2023   Bronchiectasis with acute exacerbation (HCC) 03/15/2023   Buttock wound, left, initial encounter 11/02/2022   Orthostatic hypotension 08/13/2022   Neurogenic bowel 05/03/2022   Spasticity 05/03/2022   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/03/2022   Nerve pain 05/03/2022   Medication monitoring encounter 01/08/2022   Neurogenic bladder 10/11/2021   Urinary incontinence 10/11/2021   ESBL (extended spectrum beta-lactamase) producing bacteria infection 10/09/2021   Recurrent UTI 10/09/2021    Quadriplegia, C5-C7 incomplete (HCC) 01/16/2021   History of spinal fracture 01/16/2021   Suprapubic catheter (HCC) 01/16/2021   Encounter for routine gynecological examination 09/28/2013   Onychomycosis 09/28/2013   Foot deformity, acquired 03/26/2012   Encounter for preventive health examination 12/25/2010   ROSACEA 08/25/2009   Disturbance in sleep behavior 03/11/2008   SKIN CANCER, HX OF 03/11/2008   DYSURIA, HX OF 03/11/2008   Hyperlipidemia 02/10/2007   CERVICALGIA 02/10/2007    ONSET DATE: 08/29/2023 (referral date)  REFERRING DIAG: G82.54 (ICD-10-CM) - Incomplete quadriplegia at C5-C8 level (HCC)  THERAPY DIAG:  Muscle weakness (generalized)  Other symptoms and signs involving the nervous system  Other symptoms and signs involving the musculoskeletal system  Abnormal posture  Rationale for Evaluation and Treatment: Rehabilitation  SUBJECTIVE:  SUBJECTIVE STATEMENT:  Pt received seated in her PWC following OT session, denies any acute changes since last visit.  She had hand botox  yesterday and feels this has made her arms more weak and tired.   From initial eval: Pt familiar to this clinic, last seen Oct-Nov 2024 but has been seen for multiple POCs since her initial injury in 2022. Pt returns to this clinic after being in [place]  for the past few months. While in [place]  in December 2024 patient had a fall where she slid forwards out of her wheelchair onto the floor, ended up fracturing both of her femurs. Pt was hospitalized for 10-11 days and had surgical repair of her femurs. Pt reports she has been cleared of all restrictions since surgery, does have ongoing swelling in both legs and worsened spasticity. Pt reports that the spasticity has slightly improved since it initially  started, was told by Dr. Lovorn the swelling may not resolve for 6-8 months (it has been 3 months) and not sure if spasticity will resolve but patient is hopeful that if her swelling improves her spasticity will improve as well.  Pt asking about other options to help manage the swelling her legs, has tried variable compression stockings (knee high) and her PTs in [place]  recommended tight shapewear. Pt reports that the knee-high compression stockings led to increased swelling in her knees and the shapewear did not help her swelling. Encouraged patient to get thigh-high compression stockings and elevate her LE in PWC. Pt has ordered an articulating bed that will get here next Monday (3/31) so she can better elevate her legs when in bed.  Pt is also concerned about decreased ROM in her legs, [name] but she feels she has more motion in her LLE as compared to RLE and may have mild foot drop on her R side. Pt does have PRAFOs to wear at night. Pt has worked up to standing in her standing frame x 30-40 min at a time. Pt also worked on standing with her PT and in // bars in [place] . Pt also reports with her injuries she lost the ability to lock/unlock her R knee but that it is getting better. She reports she lost a lot of stamina during her hospital stay as well.  Pt also has a new wound since last seen in this clinic in her L gluteal fold, shearing injury. Pt was seeing wound care in [place]  and is scheduled to see wound care with [facility] early April (was not able to schedule with [facility] wound care until late April).  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 Yes: NPRS scale: 3-4 Pain location: elbows to fingertips on both arms Pain description: nerve pain Aggravating factors: not stated Relieving factors: not stated  PRECAUTIONS: Fall and  Other: osteoporosis  RED FLAGS: None   WEIGHT BEARING RESTRICTIONS: No  FALLS: Has patient fallen in last 6 months? Yes. Number of falls 1 fall in [place]  that resulted in B femur fractures  LIVING ENVIRONMENT: Lives with: lives with their spouse and and with full-time caregiver [name] Lives in: House/apartment Home is power wheelchair accessible Has following equipment at home: Wheelchair (power), Wheelchair (manual), Grab bars, Ramped entry, and standing frame, slide board  PLOF: Independent with household mobility with device, Independent with community mobility with device, Requires assistive device for independence,  Needs assistance with ADLs, and Needs assistance with transfers  PATIENT GOALS: still working on stand and pivot with goal to pivot to commode or to a chair work on core-will help me with standing improve my stamina - being in the hospital I lost strength/endurance   OBJECTIVE:  Note: Objective measures were completed at Evaluation unless otherwise noted.  DIAGNOSTIC FINDINGS: None update/relevant to this POC  COGNITION: Overall cognitive status: Within functional limits for tasks assessed   SENSATION: Decreased sensation in BUE and BLE secondary to incomplete quadriplegia Decreased sensation in proximal LLE as compared to distal LE  EDEMA:  Circumferential: R knee: 17; L knee: 17.5 and Figure 8: R ankle 21, L ankle 21.5  MUSCLE TONE: increased spasticity in BLE   POSTURE: rounded shoulders and forward head  LOWER EXTREMITY ROM:     Passive  Right Eval Left Eval  Hip flexion Tight hip flexors Tight hip flexors  Hip extension    Hip abduction    Hip adduction    Hip internal rotation    Hip external rotation    Knee flexion Tight HS Tight HS  Knee extension    Ankle dorsiflexion Decreased, tight gastroc Decreased, tight gastroc  Ankle plantarflexion    Ankle inversion    Ankle eversion     (Blank rows = not tested)  LOWER EXTREMITY  MMT:    MMT Right Eval Left Eval  Hip flexion 1 2-  Hip extension    Hip abduction    Hip adduction    Hip internal rotation    Hip external rotation    Knee flexion 0 3  Knee extension 2- 2-  Ankle dorsiflexion 2- 3  Ankle plantarflexion    Ankle inversion    Ankle eversion    (Blank rows = not tested)  BED MOBILITY:  From previous POC: Sit to supine Mod A Supine to sit Mod A Rolling to Right Mod A Rolling to Left Mod A Undulating mattress for wound management on standard bed (elevated-so often doing uphill sliding board transfers); she would like to continue working on sitting up independently, she has been working on rolling, needs less assistance w/ this when someone props her leg into hooklying; would like something to help her pull her left leg to her butt for stretching as well as bed mobility.  TRANSFERS: From previous POC: Pt continues using combination of bump over, slide board, and depression (squat pivot) transfers.                                                                                                                              TREATMENT:   NMR Pt received seated in her PWC, slide board transfer left PWC to mat table with min A Pt transitions to supine w/ modA for LE management and then performs x3 assisted bridges to head of mat table Pt obtains side-lying w/ PT positioning LE prior modA for elbow prop > hip lift  x10 modA > forward trunk rotation for contralateral elbow tap to mat and return to side prop midline SBA (repeated on opposite elbow) Provided feedback to nurse caregiver on stretches to focus on at least every other night as their stretching routine has been off lately (demonstrated and provided general BLE PROM and stretching throughout education): Supine:  focus on IR/ER gently to maintain mobility of hips and R DF more than left due to noted asymmetrical tightness Side-lying:  hip flexor and quad stretches (left quad tighter than right) -  can also do prone as tolerated, caregiver prefers side-lying Slideboard transfer left back to [place] with min A for LE and cath bag management during slight uphill transfer  Pt left seated in her PWC with [name] present to assist with repositioning and clothing management.   PATIENT EDUCATION: Education details: Continue stretching program and UB strengthening exercises - further focus of BLE stretching.  Person educated: Patient and Arts administrator Education method: Explanation, Demonstration, Tactile cues, and Verbal cues Education comprehension: verbalized understanding and returned demonstration  HOME EXERCISE PROGRAM: Access Code: [email or web address] URL: [email or web address] Date: 11/22/2023 Prepared by: Waddell Southgate  Exercises - Seated Elbow Extension with Self-Anchored Resistance  - 1 x daily - 7 x weekly - 3 sets - 10 reps - Supine Elbow Flexion Extension with Dumbbell  - 1 x daily - 7 x weekly - 3 sets - 10 reps - Seated Single Arm Elbow Extension Push-up on Table  - 1 x daily - 7 x weekly - 3 sets - 10 reps - Wheelchair Push-Up (AKA)  - 1 x daily - 7 x weekly - 3 sets - 5 reps - Seated Biceps Curl  - 1 x daily - 7 x weekly - 3 sets - 10 reps - Forearm Pronation and Supination with Hammer  - 1 x daily - 7 x weekly - 3 sets - 10 reps - Seated Wrist Extension with Dumbbell  - 1 x daily - 7 x weekly - 3 sets - 10 reps  GOALS: Goals reviewed with patient? Yes  GOALS (at 7/9 re-cert): Goals reviewed with patient? Yes  SHORT TERM GOALS:   Target date: 01/20/2024  Pt will perform rolling left and right from supine at no more than minA in order to demonstrate progression back to PLOF. Baseline: mod-maxA over recent 2 weeks (7/9); minA (8/12) Goal status: MET  2.  Pt will be able to reach outside of short sitting BOS and recover at no more than mod I level to demonstrate functional core strengthening. Baseline: min-modA (7/9), min A (8/7) Goal status: IN PROGRESS  LONG TERM  GOALS:  Target date: 02/17/2024  Pt will perform sit to stand transfer with LRAD with min A Baseline: max A to stedy (4/4), max A in // bars (4/25), max A in // bars (5/22), max A to +2 in // bars (6/18); modA (7/9) Goal status: ONGOING  2.  Pt to tolerate standing x 6 min using upright posture with LRAD to demonstrate improved endurance. Baseline: 30 sec in stedy (initial), 2:35 min in // bars (4/25), 3 min in // bars (5/22), 4 min in // bars (6/18); 5 minutes 4 seconds in // bars (7/9) Goal status: ONGOING  3.  Pt will be able to obtain static long sitting position with no more than minA in order to promote improved functional positioning at home. Baseline: modA for trunk and LE support into forward positioning (7/9) Goal status: INITIAL  ASSESSMENT:  CLINICAL IMPRESSION: Emphasis of skilled  session today on focusing on side-lying prop exercises for lower lateral core and shoulder stability.  She tolerates this well but fatigues quickly.  Switched focus to tailored BLE stretching to boost caregiver routine.  She has variable asymmetry in tightness but maintains generally impressive PROM with quads and hip flexors being most tight this visit.  She would benefit from further discussion of altering POC prior to upcoming re-cert.  Will discuss with care team and further with patient and caregiver.  Continue per POC.  OBJECTIVE IMPAIRMENTS: decreased balance, decreased endurance, decreased mobility, difficulty walking, decreased ROM, decreased strength, increased edema, impaired perceived functional ability, increased muscle spasms, impaired flexibility, impaired sensation, impaired tone, impaired UE functional use, postural dysfunction, and pain.   ACTIVITY LIMITATIONS: carrying, lifting, bending, standing, stairs, transfers, bed mobility, continence, bathing, toileting, dressing, reach over head, and hygiene/grooming  PARTICIPATION LIMITATIONS: meal prep, cleaning, laundry, driving, shopping,  community activity, and occupation  PERSONAL FACTORS: Age, Sex, Time since onset of injury/illness/exacerbation, and 1-2 comorbidities:   C7 ASIA C- incomplete quad w/ neurogenic bladder and bowel, HLD, Hx of skin cancerare also affecting patient's functional outcome.   REHAB POTENTIAL: Good  CLINICAL DECISION MAKING: Stable/uncomplicated  EVALUATION COMPLEXITY: High  PLAN:  PT FREQUENCY: 2x/week  PT DURATION: 8 weeks + 8 wks  PLANNED INTERVENTIONS: 02835- PT Re-evaluation, 97750- Physical Performance Testing, 97110-Therapeutic exercises, 97530- Therapeutic activity, 97112- Neuromuscular re-education, 97535- Self Care, 02859- Manual therapy, [phone]- Gait training, [phone]- Orthotic Initial, [phone]- Electrical stimulation (manual), [phone] (1-2 muscles), [phone] (3+ muscles)- Dry Needling, Patient/Family education, Balance training, Stair training, Taping, Joint mobilization, Scar mobilization, Compression bandaging, DME instructions, Wheelchair mobility training, Cryotherapy, and Moist heat  PLAN FOR NEXT SESSION: sit to stands, standing tolerance with stedy, in // bars, possibly with RW, core strengthening/stability, endurance; wants to have conversation about taking a break (3-6 months) after this POC near end of this POC, standing lateral weight shifts, mini-squats in standing?, Yoga block press-ups vs push-up blocks, long-sitting - unsupported for core work?, bump transfers with and without slide board - pt wants to work on this skill!, lateral leans, work on mini squats in standing frame, prone UB strengthening, rolling L/R on mat and breaking down parts of rolling  Re-cert at last scheduled visit - taper to 1x/wk for month of September to access ability to maintain progress vs can justify 2x/week for September if we are working towards a d/c plan  Daved KATHEE Bull, PT, DPT    01/31/2024, 2:02 PM

## 2024-01-31 NOTE — Patient Instructions (Addendum)
 Left - rubber band; Right - place and hold to work on finger separation  Checker activities - pick up, store, shift to fingertips, and stack  Putty squeezes Putty Pinches - thumb to each finger x 5 making O's  Golf Solitaire (see handout)  Mouse or fishing movement with wrists
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
## 2024-02-01 ENCOUNTER — Ambulatory Visit (INDEPENDENT_AMBULATORY_CARE_PROVIDER_SITE_OTHER)

## 2024-02-01 ENCOUNTER — Encounter: Payer: Self-pay | Admitting: Internal Medicine

## 2024-02-01 ENCOUNTER — Telehealth: Payer: Self-pay

## 2024-02-01 VITALS — Ht 64.0 in | Wt 123.0 lb

## 2024-02-01 DIAGNOSIS — F4321 Adjustment disorder with depressed mood: Secondary | ICD-10-CM | POA: Diagnosis not present

## 2024-02-01 DIAGNOSIS — Z0000 Encounter for general adult medical examination without abnormal findings: Secondary | ICD-10-CM
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
## 2024-02-01 NOTE — Patient Instructions (Signed)
 [name] , Thank you for taking time out of your busy schedule to complete your Annual Wellness Visit with me. I enjoyed our conversation and look forward to speaking with you again next year. I, as well as your care team,  appreciate your ongoing commitment to your health goals. Please review the following plan we discussed and let me know if I can assist you in the future. Your Game plan/ To Do List    Referrals: If you haven't heard from the office you've been referred to, please reach out to them at the phone provided.   Follow up Visits: We will see or speak with you next year for your Next Medicare AWV with our clinical staff 02/06/25 @ 3p  Have you seen your provider in the last 6 months (3 months if uncontrolled diabetes)? Next appointment with provider 03/08/24 @ 11a  Clinician Recommendations:  Aim for 30 minutes of exercise or brisk walking, 6-8 glasses of water, and 5 servings of fruits and vegetables each day.       This is a list of the screenings recommended for you:  Health Maintenance  Topic Date Due   COVID-19 Vaccine (12 - Moderna risk 2024-25 season) 10/16/2023   Flu Shot  01/13/2024   Cologuard (Stool DNA test)  10/11/2024   Mammogram  11/16/2024   Medicare Annual Wellness Visit  01/31/2025   DTaP/Tdap/Td vaccine (3 - Td or Tdap) 04/17/2032   Pneumococcal Vaccine for age over 63  Completed   DEXA scan (bone density measurement)  Completed   Hepatitis C Screening  Completed   Zoster (Shingles) Vaccine  Completed   HPV Vaccine  Aged Out   Meningitis B Vaccine  Aged Out   Colon Cancer Screening  Discontinued    Advanced directives: (Copy Requested) Please bring a copy of your health care power of attorney and living will to the office to be added to your chart at your convenience. You can mail to [facility] [address] [place], [place] [address] or email to [email or web address] Advance Care Planning is important because it:  [x]  Makes  sure you receive the medical care that is consistent with your values, goals, and preferences  [x]  It provides guidance to your family and loved ones and reduces their decisional burden about whether or not they are making the right decisions based on your wishes.  Follow the link provided in your after visit summary or read over the paperwork we have mailed to you to help you started getting your Advance Directives in place. If you need assistance in completing these, please reach out to us  so that we can help you!  See attachments for Preventive Care and Fall Prevention Tips.
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
## 2024-02-02 ENCOUNTER — Ambulatory Visit: Admitting: Occupational Therapy

## 2024-02-02 ENCOUNTER — Encounter: Payer: Self-pay | Admitting: Family Medicine

## 2024-02-02 ENCOUNTER — Ambulatory Visit: Admitting: Family Medicine

## 2024-02-02 ENCOUNTER — Ambulatory Visit: Admitting: Physical Therapy

## 2024-02-02 VITALS — BP 108/64 | HR 74 | Temp 97.7°F | Ht 64.0 in

## 2024-02-02 DIAGNOSIS — M25642 Stiffness of left hand, not elsewhere classified: Secondary | ICD-10-CM

## 2024-02-02 DIAGNOSIS — R208 Other disturbances of skin sensation: Secondary | ICD-10-CM

## 2024-02-02 DIAGNOSIS — R29898 Other symptoms and signs involving the musculoskeletal system: Secondary | ICD-10-CM

## 2024-02-02 DIAGNOSIS — M7918 Myalgia, other site: Secondary | ICD-10-CM

## 2024-02-02 DIAGNOSIS — M6281 Muscle weakness (generalized): Secondary | ICD-10-CM | POA: Diagnosis not present

## 2024-02-02 DIAGNOSIS — R29818 Other symptoms and signs involving the nervous system: Secondary | ICD-10-CM

## 2024-02-02 DIAGNOSIS — M25641 Stiffness of right hand, not elsewhere classified: Secondary | ICD-10-CM | POA: Diagnosis not present

## 2024-02-02 DIAGNOSIS — M24542 Contracture, left hand: Secondary | ICD-10-CM

## 2024-02-02 DIAGNOSIS — R293 Abnormal posture: Secondary | ICD-10-CM

## 2024-02-02 DIAGNOSIS — R278 Other lack of coordination: Secondary | ICD-10-CM

## 2024-02-02 DIAGNOSIS — G8254 Quadriplegia, C5-C7 incomplete: Secondary | ICD-10-CM

## 2024-02-02 DIAGNOSIS — M24541 Contracture, right hand: Secondary | ICD-10-CM | POA: Diagnosis not present

## 2024-02-02 DIAGNOSIS — R109 Unspecified abdominal pain: Secondary | ICD-10-CM
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
## 2024-02-02 NOTE — Patient Instructions (Signed)
-  It was a pleasure to care for you today. -Recommend heat compresses 4-6 times a day, up to 20 minutes at time.  -Recommend Aleve 500mg  tablet, twice a day for pain as needed. Eat a snack when taking medication.  -Follow up if improved or becomes worse.

## 2024-02-02 NOTE — Therapy (Signed)
 OUTPATIENT PHYSICAL THERAPY NEURO TREATMENT   Patient Name: [name] MRN: [id number] DOB:Feb 12, 1951, 73 y.o., female Today's Date: 02/02/2024   PCP: [name], MD REFERRING PROVIDER: [name], MD   END OF SESSION:   PT End of Session - 02/02/24 1106     Visit Number 37    Number of Visits 45   29 + 16 at re-cert 7/9   Date for PT Re-Evaluation 03/02/24   to allow for scheduling delays/known upcoming pt conflicts   Authorization Type [employer] MEDICARE    PT Start Time 1106   from OT session   PT Stop Time 1147    PT Time Calculation (min) 41 min    Activity Tolerance Patient tolerated treatment well    Behavior During Therapy [facility] for tasks assessed/performed               Past Medical History:  Diagnosis Date   Arthritis 2019   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 Aneita   Fibroid 2004   Per Dr. Lenon   History of shingles    face and mouth   Hx of skin cancer, basal cell    Hyperlipidemia 2022   lower legs and feet   Rosacea    Sciatica of left side 09/28/2013   Scoliosis    noted on mri done for back pain   Past Surgical History:  Procedure Laterality Date   BUNIONECTOMY     SPINE SURGERY  07/28/20   fused C5-C6   Patient Active Problem List   Diagnosis Date Noted   Hematuria 12/28/2023   Medication management 12/26/2023   Buttock wound, left, subsequent encounter 03/16/2023   Bronchiectasis with acute exacerbation (HCC) 03/15/2023   Buttock wound, left, initial encounter 11/02/2022   Orthostatic hypotension 08/13/2022   Neurogenic bowel 05/03/2022   Spasticity 05/03/2022   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/03/2022   Nerve pain 05/03/2022   Medication monitoring encounter 01/08/2022   Neurogenic bladder 10/11/2021   Urinary incontinence 10/11/2021   ESBL (extended spectrum beta-lactamase) producing bacteria infection 10/09/2021    Recurrent UTI 10/09/2021   Quadriplegia, C5-C7 incomplete (HCC) 01/16/2021   History of spinal fracture 01/16/2021   Suprapubic catheter (HCC) 01/16/2021   Encounter for routine gynecological examination 09/28/2013   Onychomycosis 09/28/2013   Foot deformity, acquired 03/26/2012   Encounter for preventive health examination 12/25/2010   ROSACEA 08/25/2009   Disturbance in sleep behavior 03/11/2008   SKIN CANCER, HX OF 03/11/2008   DYSURIA, HX OF 03/11/2008   Hyperlipidemia 02/10/2007   CERVICALGIA 02/10/2007    ONSET DATE: 08/29/2023 (referral date)  REFERRING DIAG: G82.54 (ICD-10-CM) - Incomplete quadriplegia at C5-C8 level (HCC)  THERAPY DIAG:  Muscle weakness (generalized)  Other symptoms and signs involving the nervous system  Other symptoms and signs involving the musculoskeletal system  Abnormal posture  Quadriplegia, C5-C7 incomplete (HCC)  Other lack of coordination  Rationale for Evaluation and Treatment: Rehabilitation  SUBJECTIVE:  SUBJECTIVE STATEMENT:  Pt received seated in her PWC following OT session, denies any acute changes since last visit.  She had hand botox  Monday but has not noticed much of an effect yet, did 50% more this time.  Pt very tender on her R back lower side of her thoracic region, no bruising but very tender to light touch. She has reached out to her doctor about imaging with concern for a rib fracture, has not heard back yet. Pt not wearing her abdominal binder today as [name] did not help her up this morning but [name] did. Typically she wears it for breathing and posture.   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 Yes:  NPRS scale: 3-4 Pain location: elbows to fingertips on both arms Pain description: nerve pain Aggravating factors: not stated Relieving factors: not stated  PRECAUTIONS: Fall and Other: osteoporosis  RED FLAGS: None   WEIGHT BEARING RESTRICTIONS: No  FALLS: Has patient fallen in last 6 months? Yes. Number of falls 1 fall in [place]  that resulted in B femur fractures  LIVING ENVIRONMENT: Lives with: lives with their spouse and and with full-time caregiver [name] Lives in: House/apartment Home is power wheelchair accessible Has following equipment at home: Wheelchair (power), Wheelchair (manual), Grab bars, Ramped entry, and standing frame, slide board  PLOF: Independent with household mobility with device, Independent with community mobility with device, Requires assistive device for independence, Needs assistance with ADLs, and Needs assistance with transfers  PATIENT GOALS: still working on stand and pivot with goal to pivot to commode or to a chair work on core-will help me with standing improve my stamina - being in the hospital I lost strength/endurance   OBJECTIVE:  Note: Objective measures were completed at Evaluation unless otherwise noted.  DIAGNOSTIC FINDINGS: None update/relevant to this POC  COGNITION: Overall cognitive status: Within functional limits for tasks assessed   SENSATION: Decreased sensation in BUE and BLE secondary to incomplete quadriplegia Decreased sensation in proximal LLE as compared to distal LE  EDEMA:  Circumferential: R knee: 17; L knee: 17.5 and Figure 8: R ankle 21, L ankle 21.5  MUSCLE TONE: increased spasticity in BLE   POSTURE: rounded shoulders and forward head  LOWER EXTREMITY ROM:     Passive  Right Eval Left Eval  Hip flexion Tight hip flexors Tight hip flexors  Hip extension    Hip abduction    Hip adduction    Hip internal rotation    Hip external rotation    Knee flexion Tight HS Tight HS  Knee extension     Ankle dorsiflexion Decreased, tight gastroc Decreased, tight gastroc  Ankle plantarflexion    Ankle inversion    Ankle eversion     (Blank rows = not tested)  LOWER EXTREMITY MMT:    MMT Right Eval Left Eval  Hip flexion 1 2-  Hip extension    Hip abduction    Hip adduction    Hip internal rotation    Hip external rotation    Knee flexion 0 3  Knee extension 2- 2-  Ankle dorsiflexion 2- 3  Ankle plantarflexion    Ankle inversion    Ankle eversion    (Blank rows = not tested)  BED MOBILITY:  From previous POC: Sit to supine Mod A Supine to sit Mod A Rolling to Right Mod A Rolling to  Left Mod A Undulating mattress for wound management on standard bed (elevated-so often doing uphill sliding board transfers); she would like to continue working on sitting up independently, she has been working on rolling, needs less assistance w/ this when someone props her leg into hooklying; would like something to help her pull her left leg to her butt for stretching as well as bed mobility.  TRANSFERS: From previous POC: Pt continues using combination of bump over, slide board, and depression (squat pivot) transfers.                                                                                                                              TREATMENT:   TherAct/Self-Care Pt received seated in her PWC, slide board transfer to the R PWC to mat table with min A Pt transitions to supine w/ modA for LE management Supine BLE hip, knee, and ankle PROM performed in available planes of motion Pt noted to have most tightness in her hip adductors (R>L) and benefits most from butterfly stretch and hip abd stretch Supine to sit with max A for BLE management and trunk elevation Slideboard transfer to the left back to PWC with mod A for LE and cath bag management during uphill transfer Pt left seated in her PWC with [name] present to assist with repositioning and clothing management.   Discussed PT  POC with options given of 2x/week for September or 1x/week for Sept and Oct with 8 total visits being added beyond what patient is already scheduled for. Discussed plan to practice things in therapy sessions going forwards that patient can progress to working on at home with [name] or with Wolm. Educated patient on PT POC and that PT model typically involves an episode of care followed by a few months break before returning to therapy and starting a new episode of care with focus on independent management at home with her HEP during the time she is not in therapy. Pt to consider options given and discuss with [name], will update therapy team next visit and schedule at that time.   PATIENT EDUCATION: Education details: Continue stretching program and UB strengthening exercises - further focus of BLE stretching., see above regarding PT POC Person educated: Patient and Spouse [name] Education method: Explanation, Demonstration, Tactile cues, and Verbal cues Education comprehension: verbalized understanding and returned demonstration  HOME EXERCISE PROGRAM: Access Code: [email or web address] URL: [email or web address] Date: 11/22/2023 Prepared by: Waddell Southgate  Exercises - Seated Elbow Extension with Self-Anchored Resistance  - 1 x daily - 7 x weekly - 3 sets - 10 reps - Supine Elbow Flexion Extension with Dumbbell  - 1 x daily - 7 x weekly - 3 sets - 10 reps - Seated Single Arm Elbow Extension Push-up on Table  - 1 x daily - 7 x weekly - 3 sets - 10 reps - Wheelchair Push-Up (AKA)  - 1 x daily - 7 x weekly - 3 sets - 5 reps -  Seated Biceps Curl  - 1 x daily - 7 x weekly - 3 sets - 10 reps - Forearm Pronation and Supination with Hammer  - 1 x daily - 7 x weekly - 3 sets - 10 reps - Seated Wrist Extension with Dumbbell  - 1 x daily - 7 x weekly - 3 sets - 10 reps  GOALS: Goals reviewed with patient? Yes  GOALS (at 7/9 re-cert): Goals reviewed with patient? Yes  SHORT TERM GOALS:   Target  date: 01/20/2024  Pt will perform rolling left and right from supine at no more than minA in order to demonstrate progression back to PLOF. Baseline: mod-maxA over recent 2 weeks (7/9); minA (8/12) Goal status: MET  2.  Pt will be able to reach outside of short sitting BOS and recover at no more than mod I level to demonstrate functional core strengthening. Baseline: min-modA (7/9), min A (8/7) Goal status: IN PROGRESS  LONG TERM GOALS:  Target date: 02/17/2024  Pt will perform sit to stand transfer with LRAD with min A Baseline: max A to stedy (4/4), max A in // bars (4/25), max A in // bars (5/22), max A to +2 in // bars (6/18); modA (7/9) Goal status: ONGOING  2.  Pt to tolerate standing x 6 min using upright posture with LRAD to demonstrate improved endurance. Baseline: 30 sec in stedy (initial), 2:35 min in // bars (4/25), 3 min in // bars (5/22), 4 min in // bars (6/18); 5 minutes 4 seconds in // bars (7/9) Goal status: ONGOING  3.  Pt will be able to obtain static long sitting position with no more than minA in order to promote improved functional positioning at home. Baseline: modA for trunk and LE support into forward positioning (7/9) Goal status: INITIAL  ASSESSMENT:  CLINICAL IMPRESSION: Emphasis of skilled session today on discussing PT POC with plan to d/c from PT after 8 additional visits added beyond what is already scheduled for patient with education about plan to focus on getting her and her caregivers to a level where they can comfortably assist her with her HEP at home so that she can take a several month break from PT before starting a new episode of care. Pt understanding of education provided this session and will consider options for scheduling and discuss with her nurse/caregiver [name] before scheduling next visit. Remainder of session focused on performing BLE stretching in order to avoid irritating R rib area pending imaging. Pt noted to have the most muscle  tightness in her B hip adductors (R>L) and benefits from continued stretching of these areas. Continue per POC.  OBJECTIVE IMPAIRMENTS: decreased balance, decreased endurance, decreased mobility, difficulty walking, decreased ROM, decreased strength, increased edema, impaired perceived functional ability, increased muscle spasms, impaired flexibility, impaired sensation, impaired tone, impaired UE functional use, postural dysfunction, and pain.   ACTIVITY LIMITATIONS: carrying, lifting, bending, standing, stairs, transfers, bed mobility, continence, bathing, toileting, dressing, reach over head, and hygiene/grooming  PARTICIPATION LIMITATIONS: meal prep, cleaning, laundry, driving, shopping, community activity, and occupation  PERSONAL FACTORS: Age, Sex, Time since onset of injury/illness/exacerbation, and 1-2 comorbidities:   C7 ASIA C- incomplete quad w/ neurogenic bladder and bowel, HLD, Hx of skin cancerare also affecting patient's functional outcome.   REHAB POTENTIAL: Good  CLINICAL DECISION MAKING: Stable/uncomplicated  EVALUATION COMPLEXITY: High  PLAN:  PT FREQUENCY: 2x/week  PT DURATION: 8 weeks + 8 wks  PLANNED INTERVENTIONS: 97164- PT Re-evaluation, 97750- Physical Performance Testing, 97110-Therapeutic exercises, 97530- Therapeutic  activity, [id number]- Neuromuscular re-education, [phone]- Self Care, [id number]- Manual therapy, [phone]- Gait training, [phone]- Orthotic Initial, [phone]- Electrical stimulation (manual), [phone] (1-2 muscles), [id number] (3+ muscles)- Dry Needling, Patient/Family education, Balance training, Stair training, Taping, Joint mobilization, Scar mobilization, Compression bandaging, DME instructions, Wheelchair mobility training, Cryotherapy, and Moist heat  PLAN FOR NEXT SESSION: sit to stands, standing tolerance with stedy, in // bars, possibly with RW, core strengthening/stability, endurance; wants to have conversation about taking a break (3-6 months) after this POC near end  of this POC, standing lateral weight shifts, mini-squats in standing?, Yoga block press-ups vs push-up blocks, long-sitting - unsupported for core work?, bump transfers with and without slide board - pt wants to work on this skill!, lateral leans, work on mini squats in standing frame, prone UB strengthening, rolling L/R on mat and breaking down parts of rolling, R rib xray?  Re-cert at last scheduled visit - pt to decide between 1x/week for Sept and Oct (8 visits) OR 2x/week Sept (8 visits) and schedule on Wed 8/27  Waddell Southgate, PT Waddell Southgate, PT, DPT, CSRS     02/02/2024, 11:48 AM

## 2024-02-02 NOTE — Telephone Encounter (Signed)
 Pt spoke to pt to follow up. Inform pt Dr. Mercer no longer has 400pm available. But 4:30pm. Pt states she has appt with Philippe Slade today 340pm. No further action is needed.

## 2024-02-02 NOTE — Progress Notes (Signed)
   Acute Office Visit   Subjective:  Patient ID: [name], female    DOB: 09-04-1950, 73 y.o.   MRN: [id number]  Chief Complaint  Patient presents with   Back Pain    Back Pain   Patient is here for an acute visit since Dr. Charlett, PCP, is not available. She is complaining of pin point in her right flank area. Denies pain radiating. She was concerned since she has has osteoporosis. Concerned she had fractured a rib. She is in a motorized wheelchair from being quadriplegia at C5-C7. She report she was transferred a lot this weekend with being out of town, more than usual.   Denies any SHOB, difficulty with taking a deep breath, or upper torso movement. Denies any abd symptoms: abd pain, fever, nausea, vomiting, or constipation. Patient does have a suprapubic catheter. Urine is yellow, clear,  Review of Systems  Musculoskeletal:  Positive for back pain.   See HPI above      Objective:   BP 108/64   Pulse 74   Temp 97.7 F (36.5 C) (Oral)   Ht 5' 4 (1.626 m)   SpO2 95%   BMI 21.11 kg/m    Physical Exam Vitals reviewed.  Constitutional:      General: She is not in acute distress.    Appearance: Normal appearance. She is not ill-appearing, toxic-appearing or diaphoretic.  HENT:     Head: Normocephalic and atraumatic.  Eyes:     General:        Right eye: No discharge.        Left eye: No discharge.     Conjunctiva/sclera: Conjunctivae normal.  Cardiovascular:     Rate and Rhythm: Normal rate and regular rhythm.     Heart sounds: Normal heart sounds. No murmur heard.    No friction rub. No gallop.  Pulmonary:     Effort: Pulmonary effort is normal. No respiratory distress.     Breath sounds: Normal breath sounds.  Abdominal:     Comments: Pin point pain in the right flank area.   Musculoskeletal:     Lumbar back: No tenderness.  Skin:    General: Skin is warm and dry.  Neurological:     General: No focal deficit present.     Mental Status: She is alert  and oriented to person, place, and time. Mental status is at baseline.     Gait: Gait abnormal (Motorized wheelchair).  Psychiatric:        Mood and Affect: Mood normal.        Behavior: Behavior normal.        Thought Content: Thought content normal.        Judgment: Judgment normal.       Assessment & Plan:  Right flank pain  Lumbar muscle pain  -Based on presentation of pin point pain in the location with no other symptoms, and previously been transferring more often, suspect it is more related to muscle pain.  -Recommend heat compresses 4-6 times a day, up to 20 minutes at time.  -Recommend Aleve 500mg  tablet, twice a day for pain as needed. Eat a snack when taking medication.  -Follow up if improved or becomes worse.   Janaki Exley, NP

## 2024-02-05 ENCOUNTER — Other Ambulatory Visit: Payer: Self-pay | Admitting: Physical Medicine and Rehabilitation

## 2024-02-07 ENCOUNTER — Encounter: Admitting: Occupational Therapy

## 2024-02-07 ENCOUNTER — Ambulatory Visit: Admitting: Physical Therapy

## 2024-02-08 ENCOUNTER — Ambulatory Visit: Payer: Self-pay | Admitting: Physical Therapy

## 2024-02-08 ENCOUNTER — Encounter: Admitting: Physical Medicine and Rehabilitation

## 2024-02-08 ENCOUNTER — Encounter: Payer: Self-pay | Admitting: Physical Therapy

## 2024-02-08 ENCOUNTER — Ambulatory Visit: Payer: Self-pay | Admitting: Occupational Therapy

## 2024-02-08 DIAGNOSIS — M25642 Stiffness of left hand, not elsewhere classified: Secondary | ICD-10-CM

## 2024-02-08 DIAGNOSIS — M25641 Stiffness of right hand, not elsewhere classified: Secondary | ICD-10-CM

## 2024-02-08 DIAGNOSIS — G8254 Quadriplegia, C5-C7 incomplete: Secondary | ICD-10-CM | POA: Diagnosis not present

## 2024-02-08 DIAGNOSIS — R293 Abnormal posture: Secondary | ICD-10-CM | POA: Diagnosis not present

## 2024-02-08 DIAGNOSIS — R29898 Other symptoms and signs involving the musculoskeletal system: Secondary | ICD-10-CM | POA: Diagnosis not present

## 2024-02-08 DIAGNOSIS — R278 Other lack of coordination: Secondary | ICD-10-CM

## 2024-02-08 DIAGNOSIS — M24541 Contracture, right hand: Secondary | ICD-10-CM | POA: Diagnosis not present

## 2024-02-08 DIAGNOSIS — R29818 Other symptoms and signs involving the nervous system: Secondary | ICD-10-CM

## 2024-02-08 DIAGNOSIS — M6281 Muscle weakness (generalized): Secondary | ICD-10-CM

## 2024-02-08 NOTE — Therapy (Unsigned)
 OUTPATIENT PHYSICAL THERAPY NEURO TREATMENT   Patient Name: [name] MRN: [id number] DOB:May 04, 1951, 73 y.o., female Today's Date: 02/10/2024   PCP: [name], MD REFERRING PROVIDER: [name], MD   END OF SESSION:    02/08/24 1317  PT Visits / Re-Eval  Visit Number 38  Number of Visits 45 (29 + 16 at re-cert 7/9)  Date for PT Re-Evaluation 03/02/24 (to allow for scheduling delays/known upcoming pt conflicts)  Authorization  Authorization Type [employer] MEDICARE  PT Time Calculation  PT Start Time 1317  PT Stop Time 1400  PT Time Calculation (min) 43 min  PT - End of Session  Activity Tolerance Patient tolerated treatment well  Behavior During Therapy [facility] for tasks assessed/performed          Past Medical History:  Diagnosis Date   Arthritis 2019   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 Aneita   Fibroid 2004   Per Dr. Lenon   History of shingles    face and mouth   Hx of skin cancer, basal cell    Hyperlipidemia 2022   lower legs and feet   Rosacea    Sciatica of left side 09/28/2013   Scoliosis    noted on mri done for back pain   Past Surgical History:  Procedure Laterality Date   BUNIONECTOMY     SPINE SURGERY  07/28/20   fused C5-C6   Patient Active Problem List   Diagnosis Date Noted   Hematuria 12/28/2023   Medication management 12/26/2023   Buttock wound, left, subsequent encounter 03/16/2023   Bronchiectasis with acute exacerbation (HCC) 03/15/2023   Buttock wound, left, initial encounter 11/02/2022   Orthostatic hypotension 08/13/2022   Neurogenic bowel 05/03/2022   Spasticity 05/03/2022   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/03/2022   Nerve pain 05/03/2022   Medication monitoring encounter 01/08/2022   Neurogenic bladder 10/11/2021   Urinary incontinence 10/11/2021   ESBL (extended spectrum beta-lactamase) producing bacteria  infection 10/09/2021   Recurrent UTI 10/09/2021   Quadriplegia, C5-C7 incomplete (HCC) 01/16/2021   History of spinal fracture 01/16/2021   Suprapubic catheter (HCC) 01/16/2021   Encounter for routine gynecological examination 09/28/2013   Onychomycosis 09/28/2013   Foot deformity, acquired 03/26/2012   Encounter for preventive health examination 12/25/2010   ROSACEA 08/25/2009   Disturbance in sleep behavior 03/11/2008   SKIN CANCER, HX OF 03/11/2008   DYSURIA, HX OF 03/11/2008   Hyperlipidemia 02/10/2007   CERVICALGIA 02/10/2007    ONSET DATE: 08/29/2023 (referral date)  REFERRING DIAG: G82.54 (ICD-10-CM) - Incomplete quadriplegia at C5-C8 level (HCC)  THERAPY DIAG:  Muscle weakness (generalized)  Other symptoms and signs involving the nervous system  Other symptoms and signs involving the musculoskeletal system  Other lack of coordination  Rationale for Evaluation and Treatment: Rehabilitation  SUBJECTIVE:  SUBJECTIVE STATEMENT:  Pt received seated in her PWC in lobby, denies any acute changes since last visit.    Her flank pain has mostly resolved and she states it was muscular.   Pt accompanied by: self and husband [name] (waits in car)  PERTINENT HISTORY: C7 ASIA C- incomplete quad w/ neurogenic bladder and bowel, HLD, Hx of skin cancer  PAIN:  Are you having pain? Yes: NPRS scale: 3-4 Pain location: elbows to fingertips on both arms Pain description: nerve pain Aggravating factors: not stated Relieving factors: not stated  PRECAUTIONS: Fall and Other: osteoporosis  RED FLAGS: None   WEIGHT BEARING RESTRICTIONS: No  FALLS: Has patient fallen in last 6 months? Yes. Number of falls 1 fall in [place]  that resulted in B femur fractures  LIVING ENVIRONMENT: Lives with:  lives with their spouse and and with full-time caregiver [name] Lives in: House/apartment Home is power wheelchair accessible Has following equipment at home: Wheelchair (power), Wheelchair (manual), Grab bars, Ramped entry, and standing frame, slide board  PLOF: Independent with household mobility with device, Independent with community mobility with device, Requires assistive device for independence, Needs assistance with ADLs, and Needs assistance with transfers  PATIENT GOALS: still working on stand and pivot with goal to pivot to commode or to a chair work on core-will help me with standing improve my stamina - being in the hospital I lost strength/endurance   OBJECTIVE:  Note: Objective measures were completed at Evaluation unless otherwise noted.  DIAGNOSTIC FINDINGS: None update/relevant to this POC  COGNITION: Overall cognitive status: Within functional limits for tasks assessed   SENSATION: Decreased sensation in BUE and BLE secondary to incomplete quadriplegia Decreased sensation in proximal LLE as compared to distal LE  EDEMA:  Circumferential: R knee: 17; L knee: 17.5 and Figure 8: R ankle 21, L ankle 21.5  MUSCLE TONE: increased spasticity in BLE   POSTURE: rounded shoulders and forward head  LOWER EXTREMITY ROM:     Passive  Right Eval Left Eval  Hip flexion Tight hip flexors Tight hip flexors  Hip extension    Hip abduction    Hip adduction    Hip internal rotation    Hip external rotation    Knee flexion Tight HS Tight HS  Knee extension    Ankle dorsiflexion Decreased, tight gastroc Decreased, tight gastroc  Ankle plantarflexion    Ankle inversion    Ankle eversion     (Blank rows = not tested)  LOWER EXTREMITY MMT:    MMT Right Eval Left Eval  Hip flexion 1 2-  Hip extension    Hip abduction    Hip adduction    Hip internal rotation    Hip external rotation    Knee flexion 0 3  Knee extension 2- 2-  Ankle dorsiflexion 2- 3   Ankle plantarflexion    Ankle inversion    Ankle eversion    (Blank rows = not tested)  BED MOBILITY:  From previous POC: Sit to supine Mod A Supine to sit Mod A Rolling to Right Mod A Rolling to Left Mod A Undulating mattress for wound management on standard bed (elevated-so often doing uphill sliding board transfers); she would like to continue working on sitting up independently, she has been working on rolling, needs less assistance w/ this when someone props her leg into hooklying; would like something to help her pull her left leg to her butt for stretching as well as bed mobility.  TRANSFERS: From previous POC: Pt  continues using combination of bump over, slide board, and depression (squat pivot) transfers.                                                                                                                              TREATMENT:   TherAct/Self-Care Pt received seated in her PWC, slide board transfer to the L PWC to mat table with min A Pt transitions to supine w/ modA for LE management then into R side-lying w/ several attempts to roll Using active hands for lateral press to fatigue w/ 1lb then 2lb wt > horizontal abduction w/ red theraband to fatigue > attempted R arm prop w/ pt unable to tolerate prolonged positioning due to functional shoulder weakness Repeated tasks in same progression on opposite side using active hands and modA to maintain left side-lying due to proximity to EOM and difficulty positioning hips.  Able to tolerate elbow prop with better stability on LUE w/ progression to 1lb lateral raise w/ active hands.  Supine to sit with max A for BLE management and trunk elevation Slideboard transfer to the right back to [facility] with min A for LE and cath bag management Pt left seated in her PWC with OT present for handoff to assist with repositioning and clothing management.   Pt confirms she is fine with 1x/8 wks and will confirm scheduling with OT before  scheduling appts after visit.  PATIENT EDUCATION: Education details: Continue stretching program and UB strengthening exercises - further focus of BLE stretching, see above regarding PT POC Person educated: Patient Education method: Explanation, Demonstration, Tactile cues, and Verbal cues Education comprehension: verbalized understanding and returned demonstration  HOME EXERCISE PROGRAM: Access Code: [email or web address] URL: [email or web address] Date: 11/22/2023 Prepared by: Waddell Southgate  Exercises - Seated Elbow Extension with Self-Anchored Resistance  - 1 x daily - 7 x weekly - 3 sets - 10 reps - Supine Elbow Flexion Extension with Dumbbell  - 1 x daily - 7 x weekly - 3 sets - 10 reps - Seated Single Arm Elbow Extension Push-up on Table  - 1 x daily - 7 x weekly - 3 sets - 10 reps - Wheelchair Push-Up (AKA)  - 1 x daily - 7 x weekly - 3 sets - 5 reps - Seated Biceps Curl  - 1 x daily - 7 x weekly - 3 sets - 10 reps - Forearm Pronation and Supination with Hammer  - 1 x daily - 7 x weekly - 3 sets - 10 reps - Seated Wrist Extension with Dumbbell  - 1 x daily - 7 x weekly - 3 sets - 10 reps  GOALS: Goals reviewed with patient? Yes  GOALS (at 7/9 re-cert): Goals reviewed with patient? Yes  SHORT TERM GOALS:   Target date: 01/20/2024  Pt will perform rolling left and right from supine at no more than minA in order to demonstrate progression back to PLOF. Baseline: mod-maxA over recent 2 weeks (7/9); minA (8/12) Goal  status: MET  2.  Pt will be able to reach outside of short sitting BOS and recover at no more than mod I level to demonstrate functional core strengthening. Baseline: min-modA (7/9), min A (8/7) Goal status: IN PROGRESS  LONG TERM GOALS:  Target date: 02/17/2024  Pt will perform sit to stand transfer with LRAD with min A Baseline: max A to stedy (4/4), max A in // bars (4/25), max A in // bars (5/22), max A to +2 in // bars (6/18); modA (7/9) Goal status:  ONGOING  2.  Pt to tolerate standing x 6 min using upright posture with LRAD to demonstrate improved endurance. Baseline: 30 sec in stedy (initial), 2:35 min in // bars (4/25), 3 min in // bars (5/22), 4 min in // bars (6/18); 5 minutes 4 seconds in // bars (7/9) Goal status: ONGOING  3.  Pt will be able to obtain static long sitting position with no more than minA in order to promote improved functional positioning at home. Baseline: modA for trunk and LE support into forward positioning (7/9) Goal status: INITIAL  ASSESSMENT:  CLINICAL IMPRESSION: Focus of skilled session on lateral shoulder and core work.  She has better tolerance and stability in LUE and more challenge with modified side-plank progression today.  She continues to benefit from core and upper body strengthening to improve transfers and general stability. Continue per POC.  OBJECTIVE IMPAIRMENTS: decreased balance, decreased endurance, decreased mobility, difficulty walking, decreased ROM, decreased strength, increased edema, impaired perceived functional ability, increased muscle spasms, impaired flexibility, impaired sensation, impaired tone, impaired UE functional use, postural dysfunction, and pain.   ACTIVITY LIMITATIONS: carrying, lifting, bending, standing, stairs, transfers, bed mobility, continence, bathing, toileting, dressing, reach over head, and hygiene/grooming  PARTICIPATION LIMITATIONS: meal prep, cleaning, laundry, driving, shopping, community activity, and occupation  PERSONAL FACTORS: Age, Sex, Time since onset of injury/illness/exacerbation, and 1-2 comorbidities:   C7 ASIA C- incomplete quad w/ neurogenic bladder and bowel, HLD, Hx of skin cancerare also affecting patient's functional outcome.   REHAB POTENTIAL: Good  CLINICAL DECISION MAKING: Stable/uncomplicated  EVALUATION COMPLEXITY: High  PLAN:  PT FREQUENCY: 2x/week  PT DURATION: 8 weeks + 8 wks  PLANNED INTERVENTIONS: 02835- PT  Re-evaluation, 97750- Physical Performance Testing, 97110-Therapeutic exercises, 97530- Therapeutic activity, 97112- Neuromuscular re-education, 97535- Self Care, 02859- Manual therapy, [phone]- Gait training, [phone]- Orthotic Initial, [phone]- Electrical stimulation (manual), [phone] (1-2 muscles), [phone] (3+ muscles)- Dry Needling, Patient/Family education, Balance training, Stair training, Taping, Joint mobilization, Scar mobilization, Compression bandaging, DME instructions, Wheelchair mobility training, Cryotherapy, and Moist heat  PLAN FOR NEXT SESSION: sit to stands, standing tolerance with stedy, in // bars, possibly with RW, core strengthening/stability, endurance; wants to have conversation about taking a break (3-6 months) after this POC near end of this POC, standing lateral weight shifts, mini-squats in standing?, Yoga block press-ups vs push-up blocks, long-sitting - unsupported for core work?, bump transfers with and without slide board - pt wants to work on this skill!, lateral leans, work on mini squats in standing frame, prone UB strengthening, rolling L/R on mat and breaking down parts of rolling  Re-cert at last scheduled visit - pt to decide between 1x/week for Sept and Oct (8 visits) OR 2x/week Sept (8 visits)  Daved KATHEE Bull, PT, DPT     02/10/2024, 12:17 PM
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
## 2024-02-09 ENCOUNTER — Encounter: Payer: Self-pay | Admitting: Occupational Therapy

## 2024-02-09 ENCOUNTER — Ambulatory Visit: Admitting: Occupational Therapy

## 2024-02-09 ENCOUNTER — Ambulatory Visit: Admitting: Physical Therapy

## 2024-02-09 DIAGNOSIS — R278 Other lack of coordination: Secondary | ICD-10-CM

## 2024-02-09 DIAGNOSIS — G8254 Quadriplegia, C5-C7 incomplete: Secondary | ICD-10-CM

## 2024-02-09 DIAGNOSIS — M24541 Contracture, right hand: Secondary | ICD-10-CM | POA: Diagnosis not present

## 2024-02-09 DIAGNOSIS — M6281 Muscle weakness (generalized): Secondary | ICD-10-CM

## 2024-02-09 DIAGNOSIS — M25642 Stiffness of left hand, not elsewhere classified: Secondary | ICD-10-CM | POA: Diagnosis not present

## 2024-02-09 DIAGNOSIS — R208 Other disturbances of skin sensation: Secondary | ICD-10-CM

## 2024-02-09 DIAGNOSIS — R29818 Other symptoms and signs involving the nervous system: Secondary | ICD-10-CM

## 2024-02-09 DIAGNOSIS — R29898 Other symptoms and signs involving the musculoskeletal system: Secondary | ICD-10-CM | POA: Diagnosis not present

## 2024-02-09 DIAGNOSIS — R293 Abnormal posture: Secondary | ICD-10-CM

## 2024-02-09 DIAGNOSIS — M25641 Stiffness of right hand, not elsewhere classified: Secondary | ICD-10-CM

## 2024-02-09 NOTE — Therapy (Signed)
 OUTPATIENT PHYSICAL THERAPY NEURO TREATMENT   Patient Name: [name] MRN: [id number] DOB:07-06-1950, 73 y.o., female Today's Date: 02/09/2024   PCP: [name], MD REFERRING PROVIDER: [name], MD   END OF SESSION:   PT End of Session - 02/09/24 1015     Visit Number 39    Number of Visits 45   29 + 16 at re-cert 7/9   Date for PT Re-Evaluation 03/02/24   to allow for scheduling delays/known upcoming pt conflicts   Authorization Type [employer] MEDICARE    PT Start Time 1015    PT Stop Time 1100    PT Time Calculation (min) 45 min    Activity Tolerance Patient tolerated treatment well    Behavior During Therapy [facility] for tasks assessed/performed                Past Medical History:  Diagnosis Date   Arthritis 2019   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 Aneita   Fibroid 2004   Per Dr. Lenon   History of shingles    face and mouth   Hx of skin cancer, basal cell    Hyperlipidemia 2022   lower legs and feet   Rosacea    Sciatica of left side 09/28/2013   Scoliosis    noted on mri done for back pain   Past Surgical History:  Procedure Laterality Date   BUNIONECTOMY     SPINE SURGERY  07/28/20   fused C5-C6   Patient Active Problem List   Diagnosis Date Noted   Hematuria 12/28/2023   Medication management 12/26/2023   Buttock wound, left, subsequent encounter 03/16/2023   Bronchiectasis with acute exacerbation (HCC) 03/15/2023   Buttock wound, left, initial encounter 11/02/2022   Orthostatic hypotension 08/13/2022   Neurogenic bowel 05/03/2022   Spasticity 05/03/2022   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/03/2022   Nerve pain 05/03/2022   Medication monitoring encounter 01/08/2022   Neurogenic bladder 10/11/2021   Urinary incontinence 10/11/2021   ESBL (extended spectrum beta-lactamase) producing bacteria infection 10/09/2021   Recurrent UTI  10/09/2021   Quadriplegia, C5-C7 incomplete (HCC) 01/16/2021   History of spinal fracture 01/16/2021   Suprapubic catheter (HCC) 01/16/2021   Encounter for routine gynecological examination 09/28/2013   Onychomycosis 09/28/2013   Foot deformity, acquired 03/26/2012   Encounter for preventive health examination 12/25/2010   ROSACEA 08/25/2009   Disturbance in sleep behavior 03/11/2008   SKIN CANCER, HX OF 03/11/2008   DYSURIA, HX OF 03/11/2008   Hyperlipidemia 02/10/2007   CERVICALGIA 02/10/2007    ONSET DATE: 08/29/2023 (referral date)  REFERRING DIAG: G82.54 (ICD-10-CM) - Incomplete quadriplegia at C5-C8 level (HCC)  THERAPY DIAG:  Muscle weakness (generalized)  Other symptoms and signs involving the nervous system  Other symptoms and signs involving the musculoskeletal system  Quadriplegia, C5-C7 incomplete (HCC)  Abnormal posture  Rationale for Evaluation and Treatment: Rehabilitation  SUBJECTIVE:  SUBJECTIVE STATEMENT:  Pt received seated in her PWC, denies any acute changes since last visit.  She went to see her doctor about the tenderness on her R lower back, thought to be a muscle strain and not a rib fx. Pt reports that her pain is improving.   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 Yes: NPRS scale: 3-4 Pain location: elbows to fingertips on both arms Pain description: nerve pain Aggravating factors: not stated Relieving factors: not stated  PRECAUTIONS: Fall and Other: osteoporosis  RED FLAGS: None   WEIGHT BEARING RESTRICTIONS: No  FALLS: Has patient fallen in last 6 months? Yes. Number of falls 1 fall in [place]  that resulted in B  femur fractures  LIVING ENVIRONMENT: Lives with: lives with their spouse and and with full-time caregiver [name] Lives in: House/apartment Home is power wheelchair accessible Has following equipment at home: Wheelchair (power), Wheelchair (manual), Grab bars, Ramped entry, and standing frame, slide board  PLOF: Independent with household mobility with device, Independent with community mobility with device, Requires assistive device for independence, Needs assistance with ADLs, and Needs assistance with transfers  PATIENT GOALS: still working on stand and pivot with goal to pivot to commode or to a chair work on core-will help me with standing improve my stamina - being in the hospital I lost strength/endurance   OBJECTIVE:  Note: Objective measures were completed at Evaluation unless otherwise noted.  DIAGNOSTIC FINDINGS: None update/relevant to this POC  COGNITION: Overall cognitive status: Within functional limits for tasks assessed   SENSATION: Decreased sensation in BUE and BLE secondary to incomplete quadriplegia Decreased sensation in proximal LLE as compared to distal LE  EDEMA:  Circumferential: R knee: 17; L knee: 17.5 and Figure 8: R ankle 21, L ankle 21.5  MUSCLE TONE: increased spasticity in BLE   POSTURE: rounded shoulders and forward head  LOWER EXTREMITY ROM:     Passive  Right Eval Left Eval  Hip flexion Tight hip flexors Tight hip flexors  Hip extension    Hip abduction    Hip adduction    Hip internal rotation    Hip external rotation    Knee flexion Tight HS Tight HS  Knee extension    Ankle dorsiflexion Decreased, tight gastroc Decreased, tight gastroc  Ankle plantarflexion    Ankle inversion    Ankle eversion     (Blank rows = not tested)  LOWER EXTREMITY MMT:    MMT Right Eval Left Eval  Hip flexion 1 2-  Hip extension    Hip abduction    Hip adduction    Hip internal rotation    Hip external rotation    Knee flexion 0 3   Knee extension 2- 2-  Ankle dorsiflexion 2- 3  Ankle plantarflexion    Ankle inversion    Ankle eversion    (Blank rows = not tested)  BED MOBILITY:  From previous POC: Sit to supine Mod A Supine to sit Mod A Rolling to Right Mod A Rolling to Left Mod A Undulating mattress for wound management on standard bed (elevated-so often doing uphill sliding board transfers); she would like to continue working on sitting up independently, she has been working on rolling, needs less assistance w/ this when someone props her leg into hooklying; would like something to help her pull her left leg to her butt  for stretching as well as bed mobility.  TRANSFERS: From previous POC: Pt continues using combination of bump over, slide board, and depression (squat pivot) transfers.                                                                                                                              TREATMENT:   TherAct Pt received seated in her PWC, slide board transfer to the R PWC to mat table with min A Pt transitions to supine w/ modA for LE management Supine to long-sit on mat table with mod A for trunk elevation While in long-sit engaged in reaching outside BOS and across midline hitting Blaze pods with yellow flex bar (utilized ACE wrap around R hand) 5 Blaze pods on random setting. Performed on 1 minute intervals with 30 rest periods.  Pt requires CGA guarding. Round 1:  33 hits. (LUE) Round 2:  22 hits. (RUE) Round 3:  33 hits. (RUE with pods placed closer)  Round 1: 38 hits (LUE) Round 2: 42 hits (LUE) Round 3: 42 hits (LUE)  Slideboard transfer to the left back to [facility] with mod A for LE and cath bag management during uphill transfer Pt left seated in her PWC with [name] present to assist with repositioning and clothing management.     PATIENT EDUCATION: Education details: Continue stretching program and UB strengthening exercises - further focus of BLE stretching Person  educated: Patient and Arts administrator Education method: Explanation, Demonstration, Tactile cues, and Verbal cues Education comprehension: verbalized understanding and returned demonstration  HOME EXERCISE PROGRAM: Access Code: [email or web address] URL: [email or web address] Date: 11/22/2023 Prepared by: Waddell Southgate  Exercises - Seated Elbow Extension with Self-Anchored Resistance  - 1 x daily - 7 x weekly - 3 sets - 10 reps - Supine Elbow Flexion Extension with Dumbbell  - 1 x daily - 7 x weekly - 3 sets - 10 reps - Seated Single Arm Elbow Extension Push-up on Table  - 1 x daily - 7 x weekly - 3 sets - 10 reps - Wheelchair Push-Up (AKA)  - 1 x daily - 7 x weekly - 3 sets - 5 reps - Seated Biceps Curl  - 1 x daily - 7 x weekly - 3 sets - 10 reps - Forearm Pronation and Supination with Hammer  - 1 x daily - 7 x weekly - 3 sets - 10 reps - Seated Wrist Extension with Dumbbell  - 1 x daily - 7 x weekly - 3 sets - 10 reps  GOALS: Goals reviewed with patient? Yes  GOALS (at 7/9 re-cert): Goals reviewed with patient? Yes  SHORT TERM GOALS:   Target date: 01/20/2024  Pt will perform rolling left and right from supine at no more than minA in order to demonstrate progression back to PLOF. Baseline: mod-maxA over recent 2 weeks (7/9); minA (8/12) Goal status: MET  2.  Pt will be able to reach outside of short sitting BOS and  recover at no more than mod I level to demonstrate functional core strengthening. Baseline: min-modA (7/9), min A (8/7) Goal status: IN PROGRESS  LONG TERM GOALS:  Target date: 02/17/2024  Pt will perform sit to stand transfer with LRAD with min A Baseline: max A to stedy (4/4), max A in // bars (4/25), max A in // bars (5/22), max A to +2 in // bars (6/18); modA (7/9) Goal status: ONGOING  2.  Pt to tolerate standing x 6 min using upright posture with LRAD to demonstrate improved endurance. Baseline: 30 sec in stedy (initial), 2:35 min in // bars (4/25), 3 min  in // bars (5/22), 4 min in // bars (6/18); 5 minutes 4 seconds in // bars (7/9) Goal status: ONGOING  3.  Pt will be able to obtain static long sitting position with no more than minA in order to promote improved functional positioning at home. Baseline: modA for trunk and LE support into forward positioning (7/9) Goal status: INITIAL  ASSESSMENT:  CLINICAL IMPRESSION: Emphasis of skilled session today on working on core strengthening and sitting balance in long-sit position on mat table this date. Pt with more difficulty reaching with her RUE as compared to her LLE and increased LOB to the L when reaching to this side with her RLE. She continues to benefit from skilled PT services to work towards increased independence with HEP to maintain gains at home made in therapy. Continue per POC.  OBJECTIVE IMPAIRMENTS: decreased balance, decreased endurance, decreased mobility, difficulty walking, decreased ROM, decreased strength, increased edema, impaired perceived functional ability, increased muscle spasms, impaired flexibility, impaired sensation, impaired tone, impaired UE functional use, postural dysfunction, and pain.   ACTIVITY LIMITATIONS: carrying, lifting, bending, standing, stairs, transfers, bed mobility, continence, bathing, toileting, dressing, reach over head, and hygiene/grooming  PARTICIPATION LIMITATIONS: meal prep, cleaning, laundry, driving, shopping, community activity, and occupation  PERSONAL FACTORS: Age, Sex, Time since onset of injury/illness/exacerbation, and 1-2 comorbidities:   C7 ASIA C- incomplete quad w/ neurogenic bladder and bowel, HLD, Hx of skin cancerare also affecting patient's functional outcome.   REHAB POTENTIAL: Good  CLINICAL DECISION MAKING: Stable/uncomplicated  EVALUATION COMPLEXITY: High  PLAN:  PT FREQUENCY: 2x/week  PT DURATION: 8 weeks + 8 wks  PLANNED INTERVENTIONS: 02835- PT Re-evaluation, 97750- Physical Performance Testing,  97110-Therapeutic exercises, 97530- Therapeutic activity, 97112- Neuromuscular re-education, 97535- Self Care, 02859- Manual therapy, [phone]- Gait training, [phone]- Orthotic Initial, [phone]- Electrical stimulation (manual), [phone] (1-2 muscles), [phone] (3+ muscles)- Dry Needling, Patient/Family education, Balance training, Stair training, Taping, Joint mobilization, Scar mobilization, Compression bandaging, DME instructions, Wheelchair mobility training, Cryotherapy, and Moist heat  PLAN FOR NEXT SESSION: 40th PN, sit to stands, standing tolerance with stedy, in // bars, possibly with RW, core strengthening/stability, endurance; wants to have conversation about taking a break (3-6 months) after this POC near end of this POC, standing lateral weight shifts, mini-squats in standing?, Yoga block press-ups vs push-up blocks, long-sitting - unsupported for core work?, bump transfers with and without slide board - pt wants to work on this skill!, lateral leans, work on mini squats in standing frame, prone UB strengthening, rolling L/R on mat and breaking down parts of rolling  Waddell Southgate, PT Waddell Southgate, PT, DPT, CSRS      02/09/2024, 11:01 AM
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
## 2024-02-15 DIAGNOSIS — F4321 Adjustment disorder with depressed mood: Secondary | ICD-10-CM | POA: Diagnosis not present

## 2024-02-17 ENCOUNTER — Encounter: Payer: Self-pay | Admitting: Physical Medicine and Rehabilitation

## 2024-02-17 MED ORDER — DOCUSATE SODIUM 283 MG RE ENEM: 1.0000 | ENEMA | Freq: Every day | RECTAL | 5 refills | Status: AC

## 2024-02-21 ENCOUNTER — Encounter: Payer: Self-pay | Admitting: Occupational Therapy

## 2024-02-21 ENCOUNTER — Encounter: Payer: Self-pay | Admitting: Physical Therapy

## 2024-02-21 ENCOUNTER — Ambulatory Visit: Admitting: Occupational Therapy

## 2024-02-21 ENCOUNTER — Ambulatory Visit: Attending: Physical Medicine and Rehabilitation | Admitting: Physical Therapy

## 2024-02-21 DIAGNOSIS — R293 Abnormal posture: Secondary | ICD-10-CM | POA: Diagnosis not present

## 2024-02-21 DIAGNOSIS — R208 Other disturbances of skin sensation: Secondary | ICD-10-CM

## 2024-02-21 DIAGNOSIS — M25641 Stiffness of right hand, not elsewhere classified: Secondary | ICD-10-CM

## 2024-02-21 DIAGNOSIS — M25642 Stiffness of left hand, not elsewhere classified: Secondary | ICD-10-CM | POA: Diagnosis not present

## 2024-02-21 DIAGNOSIS — M6281 Muscle weakness (generalized): Secondary | ICD-10-CM | POA: Insufficient documentation

## 2024-02-21 DIAGNOSIS — M24541 Contracture, right hand: Secondary | ICD-10-CM | POA: Insufficient documentation

## 2024-02-21 DIAGNOSIS — M24542 Contracture, left hand: Secondary | ICD-10-CM | POA: Insufficient documentation

## 2024-02-21 DIAGNOSIS — R278 Other lack of coordination: Secondary | ICD-10-CM | POA: Insufficient documentation

## 2024-02-21 DIAGNOSIS — R29818 Other symptoms and signs involving the nervous system: Secondary | ICD-10-CM | POA: Insufficient documentation

## 2024-02-21 DIAGNOSIS — R29898 Other symptoms and signs involving the musculoskeletal system: Secondary | ICD-10-CM | POA: Diagnosis not present

## 2024-02-21 DIAGNOSIS — G8254 Quadriplegia, C5-C7 incomplete: Secondary | ICD-10-CM | POA: Diagnosis present
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
## 2024-02-21 NOTE — Therapy (Signed)
 OUTPATIENT PHYSICAL THERAPY NEURO TREATMENT - 40TH PROGRESS NOTE   Patient Name: [name] MRN: [id number] DOB:[birth year], 73 y.o., female Today's Date: 02/21/2024   PCP: Carmen Apolinar POUR, MD REFERRING PROVIDER: [name], MD  PT progress note for [name].  Reporting period 12/26/2023 to 02/21/2024  See Note below for Objective Data and Assessment of Progress/Goals  Thank you for the referral of this patient. Daved Bull, PT, DPT   END OF SESSION:   PT End of Session - 02/21/24 1256     Visit Number 40    Number of Visits 45   29 + 16 at re-cert 7/9   Date for PT Re-Evaluation 03/02/24   to allow for scheduling delays/known upcoming pt conflicts   Authorization Type [employer]IC[employer]    Progress Note Due on Visit 50    PT Start Time 1149    PT Stop Time 1230    PT Time Calculation (min) 41 min    Activity Tolerance Patient tolerated treatment well    Behavior During Therapy [facility] for tasks assessed/performed                Past Medical History:  Diagnosis Date   Arthritis 2019   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 Aneita   Fibroid 2004   Per Dr. Lenon   History of shingles    face and mouth   Hx of skin cancer, basal cell    Hyperlipidemia 2022   lower legs and feet   Rosacea    Sciatica of left side 09/28/2013   Scoliosis    noted on mri done for back pain   Past Surgical History:  Procedure Laterality Date   BUNIONECTOMY     SPINE SURGERY  07/28/20   fused C5-C6   Patient Active Problem List   Diagnosis Date Noted   Hematuria 12/28/2023   Medication management 12/26/2023   Buttock wound, left, subsequent encounter 03/16/2023   Bronchiectasis with acute exacerbation (HCC) 03/15/2023   Buttock wound, left, initial encounter 11/02/2022   Orthostatic hypotension 08/13/2022   Neurogenic bowel 05/03/2022   Spasticity 05/03/2022    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/03/2022   Nerve pain 05/03/2022   Medication monitoring encounter 01/08/2022   Neurogenic bladder 10/11/2021   Urinary incontinence 10/11/2021   ESBL (extended spectrum beta-lactamase) producing bacteria infection 10/09/2021   Recurrent UTI 10/09/2021   Quadriplegia, C5-C7 incomplete (HCC) 01/16/2021   History of spinal fracture 01/16/2021   Suprapubic catheter (HCC) 01/16/2021   Encounter for routine gynecological examination 09/28/2013   Onychomycosis 09/28/2013   Foot deformity, acquired 03/26/2012   Encounter for preventive health examination 12/25/2010   ROSACEA 08/25/2009   Disturbance in sleep behavior 03/11/2008   SKIN CANCER, HX OF 03/11/2008   DYSURIA, HX OF 03/11/2008   Hyperlipidemia 02/10/2007   CERVICALGIA 02/10/2007    ONSET DATE: 08/29/2023 (referral date)  REFERRING DIAG: G82.54 (ICD-10-CM) - Incomplete quadriplegia at C5-C8 level (HCC)  THERAPY DIAG:  Muscle weakness (generalized)  Other symptoms and signs involving the nervous system  Other symptoms and signs involving the musculoskeletal system  Abnormal posture  Other lack of coordination  Rationale for Evaluation and Treatment: Rehabilitation  SUBJECTIVE:  SUBJECTIVE STATEMENT:  Pt received seated in her PWC, denies any acute changes since last visit.    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 Yes: NPRS scale: 3-4 Pain location: elbows to fingertips on both arms Pain description: nerve pain Aggravating factors: not stated Relieving factors: not stated  PRECAUTIONS: Fall and Other: osteoporosis  RED FLAGS: None   WEIGHT BEARING RESTRICTIONS:  No  FALLS: Has patient fallen in last 6 months? Yes. Number of falls 1 fall in [place]  that resulted in B femur fractures  LIVING ENVIRONMENT: Lives with: lives with their spouse and and with full-time caregiver [name] Lives in: House/apartment Home is power wheelchair accessible Has following equipment at home: Wheelchair (power), Wheelchair (manual), Grab bars, Ramped entry, and standing frame, slide board  PLOF: Independent with household mobility with device, Independent with community mobility with device, Requires assistive device for independence, Needs assistance with ADLs, and Needs assistance with transfers  PATIENT GOALS: still working on stand and pivot with goal to pivot to commode or to a chair work on core-will help me with standing improve my stamina - being in the hospital I lost strength/endurance   OBJECTIVE:  Note: Objective measures were completed at Evaluation unless otherwise noted.  DIAGNOSTIC FINDINGS: None update/relevant to this POC  COGNITION: Overall cognitive status: Within functional limits for tasks assessed   SENSATION: Decreased sensation in BUE and BLE secondary to incomplete quadriplegia Decreased sensation in proximal LLE as compared to distal LE  EDEMA:  Circumferential: R knee: 17; L knee: 17.5 and Figure 8: R ankle 21, L ankle 21.5  MUSCLE TONE: increased spasticity in BLE   POSTURE: rounded shoulders and forward head  LOWER EXTREMITY ROM:     Passive  Right Eval Left Eval  Hip flexion Tight hip flexors Tight hip flexors  Hip extension    Hip abduction    Hip adduction    Hip internal rotation    Hip external rotation    Knee flexion Tight HS Tight HS  Knee extension    Ankle dorsiflexion Decreased, tight gastroc Decreased, tight gastroc  Ankle plantarflexion    Ankle inversion    Ankle eversion     (Blank rows = not tested)  LOWER EXTREMITY MMT:    MMT Right Eval Left Eval  Hip flexion 1 2-  Hip extension     Hip abduction    Hip adduction    Hip internal rotation    Hip external rotation    Knee flexion 0 3  Knee extension 2- 2-  Ankle dorsiflexion 2- 3  Ankle plantarflexion    Ankle inversion    Ankle eversion    (Blank rows = not tested)  BED MOBILITY:  From previous POC: Sit to supine Mod A Supine to sit Mod A Rolling to Right Mod A Rolling to Left Mod A Undulating mattress for wound management on standard bed (elevated-so often doing uphill sliding board transfers); she would like to continue working on sitting up independently, she has been working on rolling, needs less assistance w/ this when someone props her leg into hooklying; would like something to help her pull her left leg to her butt for stretching as well as bed mobility.  TRANSFERS: From previous POC: Pt continues using combination of bump over, slide board, and depression (squat pivot) transfers.  TREATMENT:   TherAct/NMR Slideboard downhill minA for LE positioning to left at onset of session. While in long-sit engaged in crossbody reaching hitting Blaze pods with yellow flex bar using active hands for 1 minute rounds and variable rest periods to increase core challenge. 6 Blaze pods on random one color taps setting. SBA-CGA w/ elbow taps to mat for increased reach span and core work. Round 1:  37 hits. (LUE) Round 2:  31 hits. (LUE) Round 3:  36 hits. (RUE) Round 1: 36 hits (RUE) Long sitting domino flips w/ crossbody reach using contralateral UE elbow support on mat > moving dominos to R side to repeat task (performed 2 rounds each side); increased RUE difficulty and time to complete flips initially only able to complete 1 flip w/ PT lightly anchoring domino to mat to improve fine motor engagement Uphill slideboard transfer to the right back to [facility] with mod A for LE and cath bag  management Pt left seated in her PWC with [name] present to assist with repositioning and clothing management.  Handoff to OT.  PATIENT EDUCATION: Education details: Continue stretching program and UB strengthening exercises - further focus of BLE stretching Person educated: Patient and Arts administrator Education method: Explanation, Demonstration, Tactile cues, and Verbal cues Education comprehension: verbalized understanding and returned demonstration  HOME EXERCISE PROGRAM: Access Code: [email or web address] URL: [email or web address] Date: 11/22/2023 Prepared by: Waddell [place]gate  Exercises - Seated Elbow Extension with Self-Anchored Resistance  - 1 x daily - 7 x weekly - 3 sets - 10 reps - Supine Elbow Flexion Extension with Dumbbell  - 1 x daily - 7 x weekly - 3 sets - 10 reps - Seated Single Arm Elbow Extension Push-up on Table  - 1 x daily - 7 x weekly - 3 sets - 10 reps - Wheelchair Push-Up (AKA)  - 1 x daily - 7 x weekly - 3 sets - 5 reps - Seated Biceps Curl  - 1 x daily - 7 x weekly - 3 sets - 10 reps - Forearm Pronation and Supination with Hammer  - 1 x daily - 7 x weekly - 3 sets - 10 reps - Seated Wrist Extension with Dumbbell  - 1 x daily - 7 x weekly - 3 sets - 10 reps  GOALS: Goals reviewed with patient? Yes  GOALS (at 7/9 re-cert): Goals reviewed with patient? Yes  SHORT TERM GOALS:   Target date: 01/20/2024  Pt will perform rolling left and right from supine at no more than minA in order to demonstrate progression back to PLOF. Baseline: mod-maxA over recent 2 weeks (7/9); minA (8/12) Goal status: MET  2.  Pt will be able to reach outside of short sitting BOS and recover at no more than mod I level to demonstrate functional core strengthening. Baseline: min-modA (7/9), min A (8/7) Goal status: IN PROGRESS  LONG TERM GOALS:  Target date: 02/17/2024  Pt will perform sit to stand transfer with LRAD with min A Baseline: max A to stedy (4/4), max A in // bars  (4/25), max A in // bars (5/22), max A to +2 in // bars (6/18); modA (7/9) Goal status: ONGOING  2.  Pt to tolerate standing x 6 min using upright posture with LRAD to demonstrate improved endurance. Baseline: 30 sec in stedy (initial), 2:35 min in // bars (4/25), 3 min in // bars (5/22), 4 min in // bars (6/18); 5 minutes 4 seconds in // bars (7/9) Goal status: ONGOING  3.  Pt  will be able to obtain static long sitting position with no more than minA in order to promote improved functional positioning at home. Baseline: modA for trunk and LE support into forward positioning (7/9) Goal status: INITIAL  ASSESSMENT:  CLINICAL IMPRESSION: Emphasis of skilled session today on continuation and expansion of methods used last session for increased core engagement as well as functional UE use in long-sitting positioning.  She continues to be challenged with RUE engagement due to decreased dexterity.  She is doing well with long-sitting progressions, but is limited in her ability to obtain position independently.  PT is preparing to decrease frequency at re-cert due to anticipating plateau in progress as pt continues to fluctuate in daily performance of functional transfers and other tasks and need to assess ability to maintain independently with home regimen.  She remains highly motivated and PT to continue addressing functional weaknesses to improve independence and mobility as able. Continue per POC.  OBJECTIVE IMPAIRMENTS: decreased balance, decreased endurance, decreased mobility, difficulty walking, decreased ROM, decreased strength, increased edema, impaired perceived functional ability, increased muscle spasms, impaired flexibility, impaired sensation, impaired tone, impaired UE functional use, postural dysfunction, and pain.   ACTIVITY LIMITATIONS: carrying, lifting, bending, standing, stairs, transfers, bed mobility, continence, bathing, toileting, dressing, reach over head, and  hygiene/grooming  PARTICIPATION LIMITATIONS: meal prep, cleaning, laundry, driving, shopping, community activity, and occupation  PERSONAL FACTORS: Age, Sex, Time since onset of injury/illness/exacerbation, and 1-2 comorbidities:   C7 ASIA C- incomplete quad w/ neurogenic bladder and bowel, HLD, Hx of skin cancerare also affecting patient's functional outcome.   REHAB POTENTIAL: Good  CLINICAL DECISION MAKING: Stable/uncomplicated  EVALUATION COMPLEXITY: High  PLAN:  PT FREQUENCY: 2x/week  PT DURATION: 8 weeks + 8 wks  PLANNED INTERVENTIONS: 02835- PT Re-evaluation, 97750- Physical Performance Testing, 97110-Therapeutic exercises, 97530- Therapeutic activity, 97112- Neuromuscular re-education, 97535- Self Care, 02859- Manual therapy, [phone]- Gait training, [phone]- Orthotic Initial, [phone]- Electrical stimulation (manual), [phone] (1-2 muscles), [phone] (3+ muscles)- Dry Needling, Patient/Family education, Balance training, Stair training, Taping, Joint mobilization, Scar mobilization, Compression bandaging, DME instructions, Wheelchair mobility training, Cryotherapy, and Moist heat  PLAN FOR NEXT SESSION: sit to stands, standing tolerance with stedy, in // bars, possibly with RW, core strengthening/stability, endurance; wants to have conversation about taking a break (3-6 months) after this POC near end of this POC, standing lateral weight shifts, mini-squats in standing?, Yoga block press-ups vs push-up blocks, long-sitting - unsupported for core work?, bump transfers with and without slide board - pt wants to work on this skill!, lateral leans, work on mini squats in standing frame, prone UB strengthening, rolling L/R on mat and breaking down parts of rolling  Daved KATHEE Bull, PT, DPT      02/21/2024, 3:14 PM

## 2024-02-27 ENCOUNTER — Other Ambulatory Visit: Payer: Self-pay | Admitting: Urology

## 2024-02-27 DIAGNOSIS — R319 Hematuria, unspecified: Secondary | ICD-10-CM | POA: Diagnosis not present

## 2024-02-27 DIAGNOSIS — N319 Neuromuscular dysfunction of bladder, unspecified: Secondary | ICD-10-CM

## 2024-02-27 DIAGNOSIS — G8254 Quadriplegia, C5-C7 incomplete: Secondary | ICD-10-CM | POA: Diagnosis not present

## 2024-02-28 ENCOUNTER — Ambulatory Visit: Admitting: Podiatry

## 2024-02-28 ENCOUNTER — Other Ambulatory Visit: Payer: Self-pay | Admitting: Internal Medicine

## 2024-02-28 VITALS — Ht 64.0 in | Wt 123.0 lb

## 2024-02-28 DIAGNOSIS — B351 Tinea unguium: Secondary | ICD-10-CM

## 2024-02-28 DIAGNOSIS — M79675 Pain in left toe(s): Secondary | ICD-10-CM

## 2024-02-28 DIAGNOSIS — M79674 Pain in right toe(s): Secondary | ICD-10-CM

## 2024-02-29 ENCOUNTER — Ambulatory Visit: Admitting: Occupational Therapy

## 2024-02-29 ENCOUNTER — Ambulatory Visit: Admitting: Physical Therapy

## 2024-02-29 ENCOUNTER — Encounter: Payer: Self-pay | Admitting: Physical Therapy

## 2024-02-29 DIAGNOSIS — R29818 Other symptoms and signs involving the nervous system: Secondary | ICD-10-CM | POA: Diagnosis not present

## 2024-02-29 DIAGNOSIS — M25641 Stiffness of right hand, not elsewhere classified: Secondary | ICD-10-CM | POA: Diagnosis not present

## 2024-02-29 DIAGNOSIS — R293 Abnormal posture: Secondary | ICD-10-CM

## 2024-02-29 DIAGNOSIS — M6281 Muscle weakness (generalized): Secondary | ICD-10-CM | POA: Diagnosis not present

## 2024-02-29 DIAGNOSIS — M24542 Contracture, left hand: Secondary | ICD-10-CM | POA: Diagnosis not present

## 2024-02-29 DIAGNOSIS — M24541 Contracture, right hand: Secondary | ICD-10-CM | POA: Diagnosis not present

## 2024-02-29 DIAGNOSIS — R29898 Other symptoms and signs involving the musculoskeletal system: Secondary | ICD-10-CM

## 2024-02-29 DIAGNOSIS — M25642 Stiffness of left hand, not elsewhere classified: Secondary | ICD-10-CM | POA: Diagnosis not present

## 2024-02-29 DIAGNOSIS — R278 Other lack of coordination: Secondary | ICD-10-CM

## 2024-02-29 DIAGNOSIS — R208 Other disturbances of skin sensation: Secondary | ICD-10-CM
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
## 2024-02-29 NOTE — Patient Instructions (Signed)
 Access Code: [email or web address] URL: [email or web address] Date: 02/29/2024 Prepared by: [name] Pride  Exercises - Thumb AROM Opposition To All Fingers  - 1 x daily - 10 reps - Seated Thumb Composite Flexion AROM  - 1 x daily - 10 reps - Seated Thumb IP Flexion AROM  - 1 x daily - 10 reps - Seated Thumb Composite Extension AROM  - 1 x daily - 10 reps - Finger Spreading  - 1 x daily - 10 reps - Hand PROM Finger Extension  - 1 x daily - 10 reps - Seated Wrist Prayer Stretch  - 1 x daily - 10 reps - Seated Elbow Extension with Self-Anchored Resistance  - 1 x daily - 10 reps - Supine Bilateral Elbow Flexion Extension with Dumbbells  - 1 x daily - 10 reps - Seated Single Arm Elbow Extension Push-up on Table  - 1 x daily - 10 reps - Wheelchair Pressure Relief  - 5-8 x daily - 3-5 reps - Seated Bilateral Elbow Extension  - 1 x daily - 10 reps - Putty Squeezes  - 1 x daily - 10 reps - Rolling Putty on Table  - 1 x daily - 10 reps - Tip PUSH with Putty  - 1 x daily - 10 reps - Finger Pinch and Pull with Putty  - 1 x daily - 10 reps - Seated Finger MP Flexion with Putty  - 1 x daily - 10 reps - Thumb Opposition with Putty  - 1 x daily - 10 reps - Wrist Flexion and Extension with Resistance Bar  - 1 x daily - 10 reps - Shoulder Adduction with Resistance Bar  - 1 x daily - 10 reps - [place] with Flex Bar Oscillations  - 1 x daily - 10 reps - Seated Shoulder Shrugs  - 1 x daily - 10 reps - Seated Scapular Retraction  - 1 x daily - 10 reps - Seated Shoulder Rolls  - 1 x daily - 10 reps

## 2024-02-29 NOTE — Therapy (Signed)
 OUTPATIENT PHYSICAL THERAPY NEURO TREATMENT - RECERTIFICATION   Patient Name: [name] MRN: [id number] DOB:1950-09-19, 73 y.o., female Today's Date: 02/29/2024   PCP: [name], MD REFERRING PROVIDER: [name], MD    END OF SESSION:   PT End of Session - 02/29/24 1149     Visit Number 41    Number of Visits 53   45 + 8 at re-cert   Date for PT Re-Evaluation 05/04/24   to allow for scheduling delays/known upcoming pt conflicts   Authorization Type [employer]IC[employer]    Progress Note Due on Visit 50    PT Start Time 1149    PT Stop Time 1233    PT Time Calculation (min) 44 min    Equipment Utilized During Treatment Gait belt    Activity Tolerance Patient tolerated treatment well    Behavior During Therapy [facility] for tasks assessed/performed                Past Medical History:  Diagnosis Date   Arthritis 2019   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 Aneita   Fibroid 2004   Per Dr. Lenon   History of shingles    face and mouth   Hx of skin cancer, basal cell    Hyperlipidemia 2022   lower legs and feet   Rosacea    Sciatica of left side 09/28/2013   Scoliosis    noted on mri done for back pain   Past Surgical History:  Procedure Laterality Date   BUNIONECTOMY     SPINE SURGERY  07/28/20   fused C5-C6   Patient Active Problem List   Diagnosis Date Noted   Hematuria 12/28/2023   Medication management 12/26/2023   Buttock wound, left, subsequent encounter 03/16/2023   Bronchiectasis with acute exacerbation (HCC) 03/15/2023   Buttock wound, left, initial encounter 11/02/2022   Orthostatic hypotension 08/13/2022   Neurogenic bowel 05/03/2022   Spasticity 05/03/2022   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/03/2022   Nerve pain 05/03/2022   Medication monitoring encounter 01/08/2022   Neurogenic bladder 10/11/2021   Urinary incontinence 10/11/2021   ESBL  (extended spectrum beta-lactamase) producing bacteria infection 10/09/2021   Recurrent UTI 10/09/2021   Quadriplegia, C5-C7 incomplete (HCC) 01/16/2021   History of spinal fracture 01/16/2021   Suprapubic catheter (HCC) 01/16/2021   Encounter for routine gynecological examination 09/28/2013   Onychomycosis 09/28/2013   Foot deformity, acquired 03/26/2012   Encounter for preventive health examination 12/25/2010   ROSACEA 08/25/2009   Disturbance in sleep behavior 03/11/2008   SKIN CANCER, HX OF 03/11/2008   DYSURIA, HX OF 03/11/2008   Hyperlipidemia 02/10/2007   CERVICALGIA 02/10/2007    ONSET DATE: 08/29/2023 (referral date)  REFERRING DIAG: G82.54 (ICD-10-CM) - Incomplete quadriplegia at C5-C8 level (HCC)  THERAPY DIAG:  Other lack of coordination - Plan: PT plan of care cert/re-cert  Other disturbances of skin sensation - Plan: PT plan of care cert/re-cert  Muscle weakness (generalized) - Plan: PT plan of care cert/re-cert  Other symptoms and signs involving the nervous system - Plan: PT plan of care cert/re-cert  Other symptoms and signs involving the musculoskeletal system - Plan: PT plan of care cert/re-cert  Abnormal posture - Plan: PT plan of care cert/re-cert  Rationale for Evaluation and Treatment: Rehabilitation  SUBJECTIVE:  SUBJECTIVE STATEMENT:  Pt received seated in her PWC, denies any acute changes since last visit.     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 Yes: NPRS scale: 3-4 Pain location: elbows to fingertips on both arms Pain description: nerve pain Aggravating factors: not stated Relieving factors: not stated  PRECAUTIONS: Fall  and Other: osteoporosis  RED FLAGS: None   WEIGHT BEARING RESTRICTIONS: No  FALLS: Has patient fallen in last 6 months? Yes. Number of falls 1 fall in [place]  that resulted in B femur fractures  LIVING ENVIRONMENT: Lives with: lives with their spouse and and with full-time caregiver [name] Lives in: House/apartment Home is power wheelchair accessible Has following equipment at home: Wheelchair (power), Wheelchair (manual), Grab bars, Ramped entry, and standing frame, slide board  PLOF: Independent with household mobility with device, Independent with community mobility with device, Requires assistive device for independence, Needs assistance with ADLs, and Needs assistance with transfers  PATIENT GOALS: still working on stand and pivot with goal to pivot to commode or to a chair work on core-will help me with standing improve my stamina - being in the hospital I lost strength/endurance   OBJECTIVE:  Note: Objective measures were completed at Evaluation unless otherwise noted.  DIAGNOSTIC FINDINGS: None update/relevant to this POC  COGNITION: Overall cognitive status: Within functional limits for tasks assessed   SENSATION: Decreased sensation in BUE and BLE secondary to incomplete quadriplegia Decreased sensation in proximal LLE as compared to distal LE  EDEMA:  Circumferential: R knee: 17; L knee: 17.5 and Figure 8: R ankle 21, L ankle 21.5  MUSCLE TONE: increased spasticity in BLE   POSTURE: rounded shoulders and forward head  LOWER EXTREMITY ROM:     Passive  Right Eval Left Eval  Hip flexion Tight hip flexors Tight hip flexors  Hip extension    Hip abduction    Hip adduction    Hip internal rotation    Hip external rotation    Knee flexion Tight HS Tight HS  Knee extension    Ankle dorsiflexion Decreased, tight gastroc Decreased, tight gastroc  Ankle plantarflexion    Ankle inversion    Ankle eversion     (Blank rows = not tested)  LOWER  EXTREMITY MMT:    MMT Right Eval Left Eval  Hip flexion 1 2-  Hip extension    Hip abduction    Hip adduction    Hip internal rotation    Hip external rotation    Knee flexion 0 3  Knee extension 2- 2-  Ankle dorsiflexion 2- 3  Ankle plantarflexion    Ankle inversion    Ankle eversion    (Blank rows = not tested)  BED MOBILITY:  From previous POC: Sit to supine Mod A Supine to sit Mod A Rolling to Right Mod A Rolling to Left Mod A Undulating mattress for wound management on standard bed (elevated-so often doing uphill sliding board transfers); she would like to continue working on sitting up independently, she has been working on rolling, needs less assistance w/ this when someone props her leg into hooklying; would like something to help her pull her left leg to her butt for stretching as well as bed mobility.  TRANSFERS: From previous POC: Pt continues using combination of bump over, slide board, and depression (squat pivot) transfers.  TREATMENT:   TherAct Attempted standing in // bars x5 unable to stand for timed period due to proximal BUE weakness reported by patient, able to stand fully x1 w/ PT providing maxA and immediately returned to sitting due to pt unable to adjust arm positioning safely to support stand.  She performs best with narrowing of // bars w/ decreased height. Slideboard slightly uphill minA for LE positioning to left at onset of session. ModA to obtain long-sit from supine > While in long-sit engaged in ipsilateral and crossbody reaching to sort colored animal pieces for fine motor challenge particularly to R hand > attempted ping pong ball fling/toss/bounce into target x7 w/ LUE, x2 w/ RUE, more difficulty w/ RUE motor control, no LOB. Right lateral bump transfer modA back to PWC, ongoing assist w/ LE placement and cath management  requiring most assist w/ bottom clearance and lateral translation today Pt left seated in her PWC with [name] present to assist with repositioning and clothing management.  PATIENT EDUCATION: Education details: Continue stretching program and UB strengthening exercises - further focus of BLE stretching.  Process for re-cert today and changes made to goals based on new onset upper body weakness during botox  timeline. Person educated: Patient and Arts administrator Education method: Explanation, Demonstration, Tactile cues, and Verbal cues Education comprehension: verbalized understanding and returned demonstration  HOME EXERCISE PROGRAM: Access Code: [email or web address] URL: [email or web address] Date: 11/22/2023 Prepared by: Waddell Southgate  Exercises - Seated Elbow Extension with Self-Anchored Resistance  - 1 x daily - 7 x weekly - 3 sets - 10 reps - Supine Elbow Flexion Extension with Dumbbell  - 1 x daily - 7 x weekly - 3 sets - 10 reps - Seated Single Arm Elbow Extension Push-up on Table  - 1 x daily - 7 x weekly - 3 sets - 10 reps - Wheelchair Push-Up (AKA)  - 1 x daily - 7 x weekly - 3 sets - 5 reps - Seated Biceps Curl  - 1 x daily - 7 x weekly - 3 sets - 10 reps - Forearm Pronation and Supination with Hammer  - 1 x daily - 7 x weekly - 3 sets - 10 reps - Seated Wrist Extension with Dumbbell  - 1 x daily - 7 x weekly - 3 sets - 10 reps  GOALS (at 7/9 re-cert): Goals reviewed with patient? Yes  SHORT TERM GOALS:   Target date: 01/20/2024  Pt will perform rolling left and right from supine at no more than minA in order to demonstrate progression back to PLOF. Baseline: mod-maxA over recent 2 weeks (7/9); minA (8/12) Goal status: MET  2.  Pt will be able to reach outside of short sitting BOS and recover at no more than mod I level to demonstrate functional core strengthening. Baseline: min-modA (7/9), min A (8/7) Goal status: IN PROGRESS  LONG TERM GOALS:  Target date:  02/17/2024  Pt will perform sit to stand transfer with LRAD with min A Baseline: max A to stedy (4/4), max A in // bars (4/25), max A in // bars (5/22), max A to +2 in // bars (6/18); modA (7/9); maxA // bars (9/17) Goal status: NOT MET  2.  Pt to tolerate standing x 6 min using upright posture with LRAD to demonstrate improved endurance. Baseline: 30 sec in stedy (initial), 2:35 min in // bars (4/25), 3 min in // bars (5/22), 4 min in // bars (6/18); 5 minutes 4 seconds in // bars (7/9); unable (9/17) Goal status:  NOT MET  3.  Pt will be able to obtain static long sitting position with no more than minA in order to promote improved functional positioning at home. Baseline: modA for trunk and LE support into forward positioning (7/9); modA (9/17) Goal status: NOT MET  GOALS (at 0/82 re-cert): Goals reviewed with patient? Yes  SHORT TERM GOALS:   Target date: 03/30/2024  Pt will be independent and compliant with advanced and finalized PROM/stretching, strengthening, and standing based HEP in order to maintain functional progress and improve mobility. Baseline: Established, pt intermittently compliant based on appt schedule (9/17) Goal status: INITIAL  LONG TERM GOALS:  Target date: 04/27/2024  Pt will perform sit to stand transfer with LRAD with min A Baseline: max A to stedy (4/4), max A in // bars (4/25), max A in // bars (5/22), max A to +2 in // bars (6/18); modA (7/9); maxA // bars (9/17) Goal status: ONGOING  2.  Pt to tolerate standing x 1 min using upright posture with LRAD at no more than modA to demonstrate improved endurance and return to functional baseline. Baseline: 30 sec in stedy (initial), 2:35 min in // bars (4/25), 3 min in // bars (5/22), 4 min in // bars (6/18); 5 minutes 4 seconds in // bars (7/9); unable (9/17) Goal status: REVISED  ASSESSMENT:  CLINICAL IMPRESSION: Patient seen for goal assessment in preparation for tapering down to 1x/wk POC.  Pt has had some  functional change in ability to stand and maintain upright and she feels this is related to botox , however, PT unsure if proximal involvement with this procedure as this is where pt is presenting with functional change.  PT to pivot goals to return to stand initiation and improved ability to clear sacrum for transfers as she demonstrates decline in this ability as of late.  Will continue per POC w/ revised goals.  OBJECTIVE IMPAIRMENTS: decreased balance, decreased endurance, decreased mobility, difficulty walking, decreased ROM, decreased strength, increased edema, impaired perceived functional ability, increased muscle spasms, impaired flexibility, impaired sensation, impaired tone, impaired UE functional use, postural dysfunction, and pain.   ACTIVITY LIMITATIONS: carrying, lifting, bending, standing, stairs, transfers, bed mobility, continence, bathing, toileting, dressing, reach over head, and hygiene/grooming  PARTICIPATION LIMITATIONS: meal prep, cleaning, laundry, driving, shopping, community activity, and occupation  PERSONAL FACTORS: Age, Sex, Time since onset of injury/illness/exacerbation, and 1-2 comorbidities:   C7 ASIA C- incomplete quad w/ neurogenic bladder and bowel, HLD, Hx of skin cancerare also affecting patient's functional outcome.   REHAB POTENTIAL: Good  CLINICAL DECISION MAKING: Stable/uncomplicated  EVALUATION COMPLEXITY: High  PLAN:  PT FREQUENCY: 2x/week + 1x/wk  PT DURATION: 8 weeks + 8 wks + 8 wks  PLANNED INTERVENTIONS: 02835- PT Re-evaluation, 97750- Physical Performance Testing, 97110-Therapeutic exercises, 97530- Therapeutic activity, 97112- Neuromuscular re-education, 97535- Self Care, 02859- Manual therapy, [phone]- Gait training, [phone]- Orthotic Initial, [phone]- Electrical stimulation (manual), [phone] (1-2 muscles), [phone] (3+ muscles)- Dry Needling, Patient/Family education, Balance training, Stair training, Taping, Joint mobilization, Scar mobilization,  Compression bandaging, DME instructions, Wheelchair mobility training, Cryotherapy, and Moist heat  PLAN FOR NEXT SESSION: sit to stands - work on initiation and lifting from seat, standing tolerance with stedy, in // bars, possibly with RW, core strengthening/stability, endurance; wants to have conversation about taking a break (3-6 months) after this POC near end of this POC, standing lateral weight shifts, mini-squats in standing?, Yoga block press-ups vs push-up blocks, long-sitting - unsupported for core work?, bump transfers with and without slide board -  pt wants to work on this skill!, lateral leans, work on mini squats in standing frame, prone UB strengthening, rolling L/R on mat and breaking down parts of rolling  Daved KATHEE Bull, PT, DPT      02/29/2024, 2:13 PM

## 2024-02-29 NOTE — Progress Notes (Signed)
  Subjective:  Patient ID: [name], female    DOB: 01/24/1951,  MRN: [id number]  Chief Complaint  Patient presents with   Nail Problem    Rm 1 RFC/Nail trim.    73 y.o. female presents with the above complaint. History confirmed with patient.  She is doing well with the nails have become somewhat elongated again and causing discomfort.  Still using compression stockings.  Objective:  Physical Exam: Edema is controlled .  Skin temperature and texture is normal, pulses are palpable.  Yellowed elongated thickened nails subungual debris and dystrophy x 10.  No active ulceration or signs of infection.  Assessment:   1. Pain due to onychomycosis of toenails of both feet        Plan:  Patient was evaluated and treated and all questions answered.  Discussed the etiology and treatment options for the condition in detail with the patient. Recommended debridement of the nails today. Sharp and mechanical debridement performed of all painful and mycotic nails today. Nails debrided in length and thickness using a nail nipper to level of comfort. Discussed treatment options including appropriate shoe gear. Follow up as needed for painful nails.  Remains ulcer free.  Return in about 4 months (around 06/29/2024) for painful thick fungal nails.

## 2024-03-01 DIAGNOSIS — F4321 Adjustment disorder with depressed mood: Secondary | ICD-10-CM | POA: Diagnosis not present

## 2024-03-07 ENCOUNTER — Ambulatory Visit: Admitting: Physical Therapy

## 2024-03-07 ENCOUNTER — Ambulatory Visit: Admitting: Occupational Therapy

## 2024-03-08 ENCOUNTER — Encounter: Admitting: Internal Medicine

## 2024-03-13 ENCOUNTER — Ambulatory Visit
Admission: RE | Admit: 2024-03-13 | Discharge: 2024-03-13 | Disposition: A | Source: Ambulatory Visit | Attending: Urology | Admitting: Urology

## 2024-03-13 ENCOUNTER — Ambulatory Visit: Admitting: Physical Therapy

## 2024-03-13 ENCOUNTER — Ambulatory Visit: Admitting: Occupational Therapy

## 2024-03-13 DIAGNOSIS — M24541 Contracture, right hand: Secondary | ICD-10-CM | POA: Diagnosis not present

## 2024-03-13 DIAGNOSIS — M25641 Stiffness of right hand, not elsewhere classified: Secondary | ICD-10-CM | POA: Diagnosis not present

## 2024-03-13 DIAGNOSIS — R29898 Other symptoms and signs involving the musculoskeletal system: Secondary | ICD-10-CM | POA: Diagnosis not present

## 2024-03-13 DIAGNOSIS — M6281 Muscle weakness (generalized): Secondary | ICD-10-CM

## 2024-03-13 DIAGNOSIS — R319 Hematuria, unspecified: Secondary | ICD-10-CM

## 2024-03-13 DIAGNOSIS — N319 Neuromuscular dysfunction of bladder, unspecified: Secondary | ICD-10-CM

## 2024-03-13 DIAGNOSIS — G8254 Quadriplegia, C5-C7 incomplete: Secondary | ICD-10-CM

## 2024-03-13 DIAGNOSIS — R293 Abnormal posture: Secondary | ICD-10-CM | POA: Diagnosis not present

## 2024-03-13 DIAGNOSIS — R29818 Other symptoms and signs involving the nervous system: Secondary | ICD-10-CM | POA: Diagnosis not present

## 2024-03-13 DIAGNOSIS — R278 Other lack of coordination: Secondary | ICD-10-CM | POA: Diagnosis not present

## 2024-03-13 DIAGNOSIS — M24542 Contracture, left hand: Secondary | ICD-10-CM | POA: Diagnosis not present

## 2024-03-13 DIAGNOSIS — M25642 Stiffness of left hand, not elsewhere classified: Secondary | ICD-10-CM

## 2024-03-13 NOTE — Therapy (Signed)
 OUTPATIENT PHYSICAL THERAPY NEURO TREATMENT   Patient Name: [name] MRN: [id number] DOB:1950/11/12, 73 y.o., female Today's Date: 03/13/2024   PCP: [name], MD REFERRING PROVIDER: [name], MD    END OF SESSION:   PT End of Session - 03/13/24 1146     Visit Number 42    Number of Visits 53   45 + 8 at re-cert   Date for Recertification  05/04/24   to allow for scheduling delays/known upcoming pt conflicts   Authorization Type [employer]    Progress Note Due on Visit 50    PT Start Time 1145    PT Stop Time 1230    PT Time Calculation (min) 45 min    Equipment Utilized During Treatment Gait belt    Activity Tolerance Patient tolerated treatment well    Behavior During Therapy [facility] for tasks assessed/performed                 Past Medical History:  Diagnosis Date   Arthritis 2019   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 Aneita   Fibroid 2004   Per Dr. Lenon   History of shingles    face and mouth   Hx of skin cancer, basal cell    Hyperlipidemia 2022   lower legs and feet   Rosacea    Sciatica of left side 09/28/2013   Scoliosis    noted on mri done for back pain   Past Surgical History:  Procedure Laterality Date   BUNIONECTOMY     SPINE SURGERY  07/28/20   fused C5-C6   Patient Active Problem List   Diagnosis Date Noted   Hematuria 12/28/2023   Medication management 12/26/2023   Buttock wound, left, subsequent encounter 03/16/2023   Bronchiectasis with acute exacerbation (HCC) 03/15/2023   Buttock wound, left, initial encounter 11/02/2022   Orthostatic hypotension 08/13/2022   Neurogenic bowel 05/03/2022   Spasticity 05/03/2022   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/03/2022   Nerve pain 05/03/2022   Medication monitoring encounter 01/08/2022   Neurogenic bladder 10/11/2021   Urinary incontinence 10/11/2021   ESBL (extended  spectrum beta-lactamase) producing bacteria infection 10/09/2021   Recurrent UTI 10/09/2021   Quadriplegia, C5-C7 incomplete (HCC) 01/16/2021   History of spinal fracture 01/16/2021   Suprapubic catheter (HCC) 01/16/2021   Encounter for routine gynecological examination 09/28/2013   Onychomycosis 09/28/2013   Foot deformity, acquired 03/26/2012   Encounter for preventive health examination 12/25/2010   ROSACEA 08/25/2009   Disturbance in sleep behavior 03/11/2008   SKIN CANCER, HX OF 03/11/2008   DYSURIA, HX OF 03/11/2008   Hyperlipidemia 02/10/2007   CERVICALGIA 02/10/2007    ONSET DATE: 08/29/2023 (referral date)  REFERRING DIAG: G82.54 (ICD-10-CM) - Incomplete quadriplegia at C5-C8 level (HCC)  THERAPY DIAG:  Muscle weakness (generalized)  Other lack of coordination  Abnormal posture  Quadriplegia, C5-C7 incomplete (HCC)  Other symptoms and signs involving the nervous system  Other symptoms and signs involving the musculoskeletal system  Rationale for Evaluation and Treatment: Rehabilitation  SUBJECTIVE:  SUBJECTIVE STATEMENT:  Pt received seated in her PWC, denies any acute changes since last visit.  Pt continues to notice weakness in her BUE, only able to perform unweighted exercises at home now. Pt does see Dr. Cornelio and Dr. Emeline tomorrow.  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 Yes: NPRS scale: 3-4 Pain location: elbows to fingertips on both arms Pain description: nerve pain Aggravating factors: not stated Relieving factors: not stated  PRECAUTIONS: Fall and Other: osteoporosis  RED FLAGS: None   WEIGHT BEARING RESTRICTIONS:  No  FALLS: Has patient fallen in last 6 months? Yes. Number of falls 1 fall in [place]  that resulted in B femur fractures  LIVING ENVIRONMENT: Lives with: lives with their spouse and and with full-time caregiver [name] Lives in: House/apartment Home is power wheelchair accessible Has following equipment at home: Wheelchair (power), Wheelchair (manual), Grab bars, Ramped entry, and standing frame, slide board  PLOF: Independent with household mobility with device, Independent with community mobility with device, Requires assistive device for independence, Needs assistance with ADLs, and Needs assistance with transfers  PATIENT GOALS: still working on stand and pivot with goal to pivot to commode or to a chair work on core-will help me with standing improve my stamina - being in the hospital I lost strength/endurance   OBJECTIVE:  Note: Objective measures were completed at Evaluation unless otherwise noted.  DIAGNOSTIC FINDINGS: None update/relevant to this POC  COGNITION: Overall cognitive status: Within functional limits for tasks assessed   SENSATION: Decreased sensation in BUE and BLE secondary to incomplete quadriplegia Decreased sensation in proximal LLE as compared to distal LE  EDEMA:  Circumferential: R knee: 17; L knee: 17.5 and Figure 8: R ankle 21, L ankle 21.5  MUSCLE TONE: increased spasticity in BLE   POSTURE: rounded shoulders and forward head  LOWER EXTREMITY ROM:     Passive  Right Eval Left Eval  Hip flexion Tight hip flexors Tight hip flexors  Hip extension    Hip abduction    Hip adduction    Hip internal rotation    Hip external rotation    Knee flexion Tight HS Tight HS  Knee extension    Ankle dorsiflexion Decreased, tight gastroc Decreased, tight gastroc  Ankle plantarflexion    Ankle inversion    Ankle eversion     (Blank rows = not tested)  LOWER EXTREMITY MMT:    MMT Right Eval Left Eval  Hip flexion 1 2-  Hip extension     Hip abduction    Hip adduction    Hip internal rotation    Hip external rotation    Knee flexion 0 3  Knee extension 2- 2-  Ankle dorsiflexion 2- 3  Ankle plantarflexion    Ankle inversion    Ankle eversion    (Blank rows = not tested)  BED MOBILITY:  From previous POC: Sit to supine Mod A Supine to sit Mod A Rolling to Right Mod A Rolling to Left Mod A Undulating mattress for wound management on standard bed (elevated-so often doing uphill sliding board transfers); she would like to continue working on sitting up independently, she has been working on rolling, needs less assistance w/ this when someone props her leg into hooklying; would like something to help her pull her left leg to her butt for stretching as well as bed mobility.  TRANSFERS: From previous POC: Pt continues using combination of bump over, slide board, and depression (squat pivot) transfers.                                                                                                                              TREATMENT:   TherAct Attempted bump transfer PWC to mat table, pt with fair clearance of her buttocks during transfer. Seated balance EOM with 4 step under BLE 2# weighted dowel chest press 3 x 10 reps with CGA to min A for trunk control Sit to supine max A for BLE management and assist with placing her trunk Supine mini-bridges 3 x 10 reps with assist to keep LE in hooklying position Supine to sit with max A for BLE management and trunk elevation Bump transfer back to w/c with max A needed to complete transfer due to patient fatigue Pt left seated in her PWC with [name] present to assist with repositioning and clothing management.  PATIENT EDUCATION: Education details: Continue stretching program and UB strengthening exercises Person educated: Patient and Arts administrator Education method: Explanation, Demonstration, Tactile cues, and Verbal cues Education comprehension: verbalized understanding  and returned demonstration  HOME EXERCISE PROGRAM: Access Code: [email or web address] URL: [email or web address] Date: 11/22/2023 Prepared by: Waddell Southgate  Exercises - Seated Elbow Extension with Self-Anchored Resistance  - 1 x daily - 7 x weekly - 3 sets - 10 reps - Supine Elbow Flexion Extension with Dumbbell  - 1 x daily - 7 x weekly - 3 sets - 10 reps - Seated Single Arm Elbow Extension Push-up on Table  - 1 x daily - 7 x weekly - 3 sets - 10 reps - Wheelchair Push-Up (AKA)  - 1 x daily - 7 x weekly - 3 sets - 5 reps - Seated Biceps Curl  - 1 x daily - 7 x weekly - 3 sets - 10 reps - Forearm Pronation and Supination with Hammer  - 1 x daily - 7 x weekly - 3 sets - 10 reps - Seated Wrist Extension with Dumbbell  - 1 x daily - 7 x weekly - 3 sets - 10 reps  GOALS (at 7/9 re-cert): Goals reviewed with patient? Yes  SHORT TERM GOALS:   Target date: 01/20/2024  Pt will perform rolling left and right from supine at no more than minA in order to demonstrate progression back to PLOF. Baseline: mod-maxA over recent 2 weeks (7/9); minA (8/12) Goal status: MET  2.  Pt will be able to reach outside of short sitting BOS and recover at no more than mod I level to demonstrate functional core strengthening. Baseline: min-modA (7/9), min A (8/7) Goal status: IN PROGRESS  LONG TERM GOALS:  Target date: 02/17/2024  Pt will perform sit to stand transfer with LRAD with min A Baseline: max A to stedy (4/4), max A in // bars (4/25), max A in // bars (5/22), max A to +2 in // bars (6/18);  modA (7/9); maxA // bars (9/17) Goal status: NOT MET  2.  Pt to tolerate standing x 6 min using upright posture with LRAD to demonstrate improved endurance. Baseline: 30 sec in stedy (initial), 2:35 min in // bars (4/25), 3 min in // bars (5/22), 4 min in // bars (6/18); 5 minutes 4 seconds in // bars (7/9); unable (9/17) Goal status: NOT MET  3.  Pt will be able to obtain static long sitting position with no  more than minA in order to promote improved functional positioning at home. Baseline: modA for trunk and LE support into forward positioning (7/9); modA (9/17) Goal status: NOT MET  GOALS (at 0/82 re-cert): Goals reviewed with patient? Yes  SHORT TERM GOALS:   Target date: 03/30/2024  Pt will be independent and compliant with advanced and finalized PROM/stretching, strengthening, and standing based HEP in order to maintain functional progress and improve mobility. Baseline: Established, pt intermittently compliant based on appt schedule (9/17) Goal status: INITIAL  LONG TERM GOALS:  Target date: 04/27/2024  Pt will perform sit to stand transfer with LRAD with min A Baseline: max A to stedy (4/4), max A in // bars (4/25), max A in // bars (5/22), max A to +2 in // bars (6/18); modA (7/9); maxA // bars (9/17) Goal status: ONGOING  2.  Pt to tolerate standing x 1 min using upright posture with LRAD at no more than modA to demonstrate improved endurance and return to functional baseline. Baseline: 30 sec in stedy (initial), 2:35 min in // bars (4/25), 3 min in // bars (5/22), 4 min in // bars (6/18); 5 minutes 4 seconds in // bars (7/9); unable (9/17) Goal status: REVISED  ASSESSMENT:  CLINICAL IMPRESSION: Emphasis of skilled PT session on attempting to work on bump transfers, working on dynamic seated balance with UB strengthening, and working on B glute strengthening. Pt does struggle with bump transfers this date and needs max A to get back to her chair at end of session due to fatigue. She continues to exhibit ongoing BUE weakness but is able to perform strengthening exercises with 2# weighted dowel this visit. She is also able to initiate B glute activation in supine position though amplitude of movement decreases with onset of fatigue. She continues to benefit from skilled PT services to work towards LTGs. Continue POC.   OBJECTIVE IMPAIRMENTS: decreased balance, decreased endurance,  decreased mobility, difficulty walking, decreased ROM, decreased strength, increased edema, impaired perceived functional ability, increased muscle spasms, impaired flexibility, impaired sensation, impaired tone, impaired UE functional use, postural dysfunction, and pain.   ACTIVITY LIMITATIONS: carrying, lifting, bending, standing, stairs, transfers, bed mobility, continence, bathing, toileting, dressing, reach over head, and hygiene/grooming  PARTICIPATION LIMITATIONS: meal prep, cleaning, laundry, driving, shopping, community activity, and occupation  PERSONAL FACTORS: Age, Sex, Time since onset of injury/illness/exacerbation, and 1-2 comorbidities:   C7 ASIA C- incomplete quad w/ neurogenic bladder and bowel, HLD, Hx of skin cancerare also affecting patient's functional outcome.   REHAB POTENTIAL: Good  CLINICAL DECISION MAKING: Stable/uncomplicated  EVALUATION COMPLEXITY: High  PLAN:  PT FREQUENCY: 2x/week + 1x/wk  PT DURATION: 8 weeks + 8 wks + 8 wks  PLANNED INTERVENTIONS: [id number]- PT Re-evaluation, 97750- Physical Performance Testing, 97110-Therapeutic exercises, 97530- Therapeutic activity, [id number]- Neuromuscular re-education, 97535- Self Care, [id number]- Manual therapy, [id number]- Gait training, [phone]- Orthotic Initial, [phone]- Electrical stimulation (manual), [phone] (1-2 muscles), [id number] (3+ muscles)- Dry Needling, Patient/Family education, Balance training, Stair training, Taping, Joint mobilization, Scar mobilization, Compression bandaging,  DME instructions, Wheelchair mobility training, Cryotherapy, and Moist heat  PLAN FOR NEXT SESSION: sit to stands - work on initiation and lifting from seat, standing tolerance with stedy, in // bars, possibly with RW, core strengthening/stability, endurance; wants to have conversation about taking a break (3-6 months) after this POC near end of this POC, standing lateral weight shifts, mini-squats in standing?, Yoga block press-ups vs push-up blocks,  long-sitting - unsupported for core work?, bump transfers with and without slide board - pt wants to work on this skill!, lateral leans, work on mini squats in standing frame, prone UB strengthening, rolling L/R on mat and breaking down parts of rolling  Waddell Southgate, PT Waddell Southgate, PT, DPT, CSRS       03/13/2024, 12:38 PM
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
## 2024-03-14 ENCOUNTER — Encounter: Attending: Physical Medicine and Rehabilitation | Admitting: Physical Medicine and Rehabilitation

## 2024-03-14 ENCOUNTER — Encounter: Payer: Self-pay | Admitting: Physical Medicine and Rehabilitation

## 2024-03-14 VITALS — BP 100/65 | HR 81 | Ht 64.0 in | Wt 120.0 lb

## 2024-03-14 DIAGNOSIS — Z993 Dependence on wheelchair: Secondary | ICD-10-CM | POA: Insufficient documentation

## 2024-03-14 DIAGNOSIS — G8254 Quadriplegia, C5-C7 incomplete: Secondary | ICD-10-CM | POA: Diagnosis not present

## 2024-03-14 DIAGNOSIS — R252 Cramp and spasm: Secondary | ICD-10-CM | POA: Diagnosis present

## 2024-03-14 DIAGNOSIS — Z9359 Other cystostomy status: Secondary | ICD-10-CM | POA: Insufficient documentation

## 2024-03-14 MED ORDER — NORTRIPTYLINE HCL 50 MG PO CAPS: 50.0000 mg | ORAL_CAPSULE | Freq: Every day | ORAL | 1 refills | Status: DC

## 2024-03-14 MED ORDER — BACLOFEN 20 MG PO TABS: ORAL_TABLET | ORAL | 3 refills | Status: AC
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
## 2024-03-15 DIAGNOSIS — F4321 Adjustment disorder with depressed mood: Secondary | ICD-10-CM | POA: Diagnosis not present

## 2024-03-20 ENCOUNTER — Ambulatory Visit: Payer: Self-pay | Admitting: Occupational Therapy

## 2024-03-20 ENCOUNTER — Encounter: Payer: Self-pay | Admitting: Speech Pathology

## 2024-03-20 ENCOUNTER — Ambulatory Visit: Attending: Physical Medicine and Rehabilitation | Admitting: Speech Pathology

## 2024-03-20 ENCOUNTER — Other Ambulatory Visit (HOSPITAL_COMMUNITY): Payer: Self-pay | Admitting: *Deleted

## 2024-03-20 DIAGNOSIS — M24542 Contracture, left hand: Secondary | ICD-10-CM | POA: Diagnosis present

## 2024-03-20 DIAGNOSIS — R1310 Dysphagia, unspecified: Secondary | ICD-10-CM | POA: Insufficient documentation

## 2024-03-20 DIAGNOSIS — G8250 Quadriplegia, unspecified: Secondary | ICD-10-CM | POA: Insufficient documentation

## 2024-03-20 DIAGNOSIS — M24541 Contracture, right hand: Secondary | ICD-10-CM | POA: Insufficient documentation

## 2024-03-20 DIAGNOSIS — M25642 Stiffness of left hand, not elsewhere classified: Secondary | ICD-10-CM

## 2024-03-20 DIAGNOSIS — R278 Other lack of coordination: Secondary | ICD-10-CM | POA: Insufficient documentation

## 2024-03-20 DIAGNOSIS — R29898 Other symptoms and signs involving the musculoskeletal system: Secondary | ICD-10-CM | POA: Diagnosis not present

## 2024-03-20 DIAGNOSIS — R498 Other voice and resonance disorders: Secondary | ICD-10-CM | POA: Diagnosis not present

## 2024-03-20 DIAGNOSIS — G8253 Quadriplegia, C5-C7 complete: Secondary | ICD-10-CM | POA: Insufficient documentation

## 2024-03-20 DIAGNOSIS — M6281 Muscle weakness (generalized): Secondary | ICD-10-CM | POA: Diagnosis not present

## 2024-03-20 DIAGNOSIS — M25641 Stiffness of right hand, not elsewhere classified: Secondary | ICD-10-CM | POA: Diagnosis not present

## 2024-03-20 DIAGNOSIS — R4789 Other speech disturbances: Secondary | ICD-10-CM | POA: Insufficient documentation

## 2024-03-20 DIAGNOSIS — G8254 Quadriplegia, C5-C7 incomplete: Secondary | ICD-10-CM | POA: Insufficient documentation

## 2024-03-20 DIAGNOSIS — R293 Abnormal posture: Secondary | ICD-10-CM | POA: Diagnosis not present

## 2024-03-20 DIAGNOSIS — R1312 Dysphagia, oropharyngeal phase: Secondary | ICD-10-CM | POA: Insufficient documentation
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
## 2024-03-20 NOTE — Patient Instructions (Addendum)
   Dr. Soldatova - laryngologist   Respiratory Muscle Strength Training (RMST) - EMST 150    PHoRTE - twice a day    10 Loud AH's with clear strong voice - 10 seconds  2. 10 Pitch glides up, 10 pitch glides down   3. 10 sentences in strong high pitch voice, like you are calling your neighbor over the phone  4. 10 sentences in strong clear low authoritative pitch, like you are the boss  5. 10 sentences (longer sentences) with strong clear voice - speak with intent   Keep reducing throat  - be mindful of this - if you feel like you want to clear your do a hard swallow
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
## 2024-03-21 ENCOUNTER — Ambulatory Visit (INDEPENDENT_AMBULATORY_CARE_PROVIDER_SITE_OTHER): Admitting: Internal Medicine

## 2024-03-21 ENCOUNTER — Encounter: Payer: Self-pay | Admitting: Internal Medicine

## 2024-03-21 ENCOUNTER — Other Ambulatory Visit (INDEPENDENT_AMBULATORY_CARE_PROVIDER_SITE_OTHER)

## 2024-03-21 VITALS — BP 132/74 | HR 66 | Temp 98.1°F | Ht 64.0 in | Wt 118.8 lb

## 2024-03-21 DIAGNOSIS — D649 Anemia, unspecified: Secondary | ICD-10-CM | POA: Diagnosis not present

## 2024-03-21 DIAGNOSIS — G479 Sleep disorder, unspecified: Secondary | ICD-10-CM

## 2024-03-21 DIAGNOSIS — Z993 Dependence on wheelchair: Secondary | ICD-10-CM | POA: Diagnosis not present

## 2024-03-21 DIAGNOSIS — Z0000 Encounter for general adult medical examination without abnormal findings: Secondary | ICD-10-CM

## 2024-03-21 DIAGNOSIS — G8254 Quadriplegia, C5-C7 incomplete: Secondary | ICD-10-CM

## 2024-03-21 DIAGNOSIS — Z79899 Other long term (current) drug therapy: Secondary | ICD-10-CM

## 2024-03-21 DIAGNOSIS — R7989 Other specified abnormal findings of blood chemistry: Secondary | ICD-10-CM

## 2024-03-21 DIAGNOSIS — Z23 Encounter for immunization: Secondary | ICD-10-CM

## 2024-03-21 LAB — HEPATIC FUNCTION PANEL
ALT: 18 U/L (ref 0–35)
AST: 21 U/L (ref 0–37)
Albumin: 4.3 g/dL (ref 3.5–5.2)
Alkaline Phosphatase: 57 U/L (ref 39–117)
Bilirubin, Direct: 0.1 mg/dL (ref 0.0–0.3)
Total Bilirubin: 0.4 mg/dL (ref 0.2–1.2)
Total Protein: 7.1 g/dL (ref 6.0–8.3)

## 2024-03-21 MED ORDER — ZOLPIDEM TARTRATE 5 MG PO TABS: 2.5000 mg | ORAL_TABLET | Freq: Every evening | ORAL | 0 refills | Status: AC | PRN

## 2024-03-21 NOTE — Patient Instructions (Addendum)
 Good to see you today . Flu vaccine and hep b #1  can get 2# in a month or later . Lab pending.   Fu lab as indicated  depending   I refilled ambien  today   use with caution.  Med check usually in 6 mos but as indicated .

## 2024-03-22 ENCOUNTER — Ambulatory Visit: Admitting: Physical Therapy

## 2024-03-22 ENCOUNTER — Ambulatory Visit: Admitting: Occupational Therapy

## 2024-03-22 DIAGNOSIS — M25641 Stiffness of right hand, not elsewhere classified: Secondary | ICD-10-CM | POA: Diagnosis not present

## 2024-03-22 DIAGNOSIS — M6281 Muscle weakness (generalized): Secondary | ICD-10-CM

## 2024-03-22 DIAGNOSIS — G8254 Quadriplegia, C5-C7 incomplete: Secondary | ICD-10-CM | POA: Diagnosis not present

## 2024-03-22 DIAGNOSIS — R278 Other lack of coordination: Secondary | ICD-10-CM

## 2024-03-22 DIAGNOSIS — R1312 Dysphagia, oropharyngeal phase: Secondary | ICD-10-CM | POA: Diagnosis not present

## 2024-03-22 DIAGNOSIS — G8253 Quadriplegia, C5-C7 complete: Secondary | ICD-10-CM | POA: Diagnosis not present

## 2024-03-22 DIAGNOSIS — R29898 Other symptoms and signs involving the musculoskeletal system: Secondary | ICD-10-CM | POA: Diagnosis not present

## 2024-03-22 DIAGNOSIS — R293 Abnormal posture: Secondary | ICD-10-CM | POA: Diagnosis not present

## 2024-03-22 DIAGNOSIS — M25642 Stiffness of left hand, not elsewhere classified: Secondary | ICD-10-CM

## 2024-03-22 DIAGNOSIS — R498 Other voice and resonance disorders: Secondary | ICD-10-CM | POA: Diagnosis not present

## 2024-03-22 DIAGNOSIS — M24542 Contracture, left hand: Secondary | ICD-10-CM

## 2024-03-22 DIAGNOSIS — M24541 Contracture, right hand: Secondary | ICD-10-CM

## 2024-03-22 LAB — IRON,TIBC AND FERRITIN PANEL
%SAT: 36 % (ref 16–45)
Ferritin: 522 ng/mL — ABNORMAL HIGH (ref 16–288)
Iron: 81 ug/dL (ref 45–160)
TIBC: 224 ug/dL — ABNORMAL LOW (ref 250–450)
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
## 2024-03-22 NOTE — Therapy (Signed)
 OUTPATIENT PHYSICAL THERAPY NEURO TREATMENT   Patient Name: [name] MRN: [id number] DOB:[birth year], 73 y.o., female Today's Date: 03/22/2024   PCP: [name], MD REFERRING PROVIDER: [name], MD    END OF SESSION:   PT End of Session - 03/22/24 1104     Visit Number 43    Number of Visits 53   45 + 8 at re-cert   Date for Recertification  05/04/24   to allow for scheduling delays/known upcoming pt conflicts   Authorization Type [employer]    Progress Note Due on Visit 50    PT Start Time 1104   from OT session   PT Stop Time 1145    PT Time Calculation (min) 41 min    Equipment Utilized During Treatment Gait belt    Activity Tolerance Patient tolerated treatment well    Behavior During Therapy [facility] for tasks assessed/performed                  Past Medical History:  Diagnosis Date   Arthritis 2019   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 Aneita   Fibroid 2004   Per Dr. Lenon   History of shingles    face and mouth   Hx of skin cancer, basal cell    Hyperlipidemia 2022   lower legs and feet   Rosacea    Sciatica of left side 09/28/2013   Scoliosis    noted on mri done for back pain   Past Surgical History:  Procedure Laterality Date   BUNIONECTOMY     SPINE SURGERY  07/28/20   fused C5-C6   Patient Active Problem List   Diagnosis Date Noted   Hematuria 12/28/2023   Medication management 12/26/2023   Buttock wound, left, subsequent encounter 03/16/2023   Bronchiectasis with acute exacerbation (HCC) 03/15/2023   Buttock wound, left, initial encounter 11/02/2022   Orthostatic hypotension 08/13/2022   Neurogenic bowel 05/03/2022   Spasticity 05/03/2022   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/03/2022   Nerve pain 05/03/2022   Medication monitoring encounter 01/08/2022   Neurogenic bladder 10/11/2021   Urinary incontinence 10/11/2021    ESBL (extended spectrum beta-lactamase) producing bacteria infection 10/09/2021   Recurrent UTI 10/09/2021   Quadriplegia, C5-C7 incomplete (HCC) 01/16/2021   History of spinal fracture 01/16/2021   Suprapubic catheter (HCC) 01/16/2021   Encounter for routine gynecological examination 09/28/2013   Onychomycosis 09/28/2013   Foot deformity, acquired 03/26/2012   Encounter for preventive health examination 12/25/2010   ROSACEA 08/25/2009   Disturbance in sleep behavior 03/11/2008   SKIN CANCER, HX OF 03/11/2008   DYSURIA, HX OF 03/11/2008   Hyperlipidemia 02/10/2007   CERVICALGIA 02/10/2007    ONSET DATE: 08/29/2023 (referral date)  REFERRING DIAG: G82.54 (ICD-10-CM) - Incomplete quadriplegia at C5-C8 level (HCC)  THERAPY DIAG:  Muscle weakness (generalized)  Other lack of coordination  Quadriplegia, C5-C7 incomplete (HCC)  Abnormal posture  Rationale for Evaluation and Treatment: Rehabilitation  SUBJECTIVE:  SUBJECTIVE STATEMENT:  Pt received seated in her PWC from OT session. Pt met with Dr. Cornelio and Dr. Emeline since last visit, unsure if the Botox  is causing her increased weakness but she is going to take a break from injections for about 6 months.  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 Yes: NPRS scale: 3-4 Pain location: elbows to fingertips on both arms Pain description: nerve pain Aggravating factors: not stated Relieving factors: not stated  PRECAUTIONS: Fall and Other: osteoporosis  RED FLAGS: None   WEIGHT BEARING RESTRICTIONS: No  FALLS: Has patient fallen in last 6 months? Yes. Number of falls 1 fall in [place]  that resulted in B femur  fractures  LIVING ENVIRONMENT: Lives with: lives with their spouse and and with full-time caregiver [name] Lives in: House/apartment Home is power wheelchair accessible Has following equipment at home: Wheelchair (power), Wheelchair (manual), Grab bars, Ramped entry, and standing frame, slide board  PLOF: Independent with household mobility with device, Independent with community mobility with device, Requires assistive device for independence, Needs assistance with ADLs, and Needs assistance with transfers  PATIENT GOALS: still working on stand and pivot with goal to pivot to commode or to a chair work on core-will help me with standing improve my stamina - being in the hospital I lost strength/endurance   OBJECTIVE:  Note: Objective measures were completed at Evaluation unless otherwise noted.  DIAGNOSTIC FINDINGS: None update/relevant to this POC  COGNITION: Overall cognitive status: Within functional limits for tasks assessed   SENSATION: Decreased sensation in BUE and BLE secondary to incomplete quadriplegia Decreased sensation in proximal LLE as compared to distal LE  EDEMA:  Circumferential: R knee: 17; L knee: 17.5 and Figure 8: R ankle 21, L ankle 21.5  MUSCLE TONE: increased spasticity in BLE   POSTURE: rounded shoulders and forward head  LOWER EXTREMITY ROM:     Passive  Right Eval Left Eval  Hip flexion Tight hip flexors Tight hip flexors  Hip extension    Hip abduction    Hip adduction    Hip internal rotation    Hip external rotation    Knee flexion Tight HS Tight HS  Knee extension    Ankle dorsiflexion Decreased, tight gastroc Decreased, tight gastroc  Ankle plantarflexion    Ankle inversion    Ankle eversion     (Blank rows = not tested)  LOWER EXTREMITY MMT:    MMT Right Eval Left Eval  Hip flexion 1 2-  Hip extension    Hip abduction    Hip adduction    Hip internal rotation    Hip external rotation    Knee flexion 0 3  Knee  extension 2- 2-  Ankle dorsiflexion 2- 3  Ankle plantarflexion    Ankle inversion    Ankle eversion    (Blank rows = not tested)  BED MOBILITY:  From previous POC: Sit to supine Mod A Supine to sit Mod A Rolling to Right Mod A Rolling to Left Mod A Undulating mattress for wound management on standard bed (elevated-so often doing uphill sliding board transfers); she would like to continue working on sitting up independently, she has been working on rolling, needs less assistance w/ this when someone props her leg into hooklying; would like something to help her pull her left leg to her butt for stretching as well  as bed mobility.  TRANSFERS: From previous POC: Pt continues using combination of bump over, slide board, and depression (squat pivot) transfers.                                                                                                                              TREATMENT:   TherAct Slide board transfer PWC to mat table with min A Sitting EOM to supine to long-sitting position with mod A for BLE management then for trunk elevation Long sitting cone retrieve with CGA to min A for balance Reaching across body and outside BOS Long sitting unweighted dowel chest press x 10 reps with min A for balance Long sitting 2# weighted dowel chest press with min A for balance Added in volleyball dowel rod hits 3 x 30 reps Slide board transfer back to w/c with min A Pt left seated in her PWC with [name] present to assist with repositioning and clothing management.  PATIENT EDUCATION: Education details: Continue stretching program and UB strengthening exercises Person educated: Patient and Arts administrator Education method: Explanation, Demonstration, Tactile cues, and Verbal cues Education comprehension: verbalized understanding and returned demonstration  HOME EXERCISE PROGRAM: Access Code: [email or web address] URL: [email or web address] Date: 11/22/2023 Prepared by:  Waddell Southgate  Exercises - Seated Elbow Extension with Self-Anchored Resistance  - 1 x daily - 7 x weekly - 3 sets - 10 reps - Supine Elbow Flexion Extension with Dumbbell  - 1 x daily - 7 x weekly - 3 sets - 10 reps - Seated Single Arm Elbow Extension Push-up on Table  - 1 x daily - 7 x weekly - 3 sets - 10 reps - Wheelchair Push-Up (AKA)  - 1 x daily - 7 x weekly - 3 sets - 5 reps - Seated Biceps Curl  - 1 x daily - 7 x weekly - 3 sets - 10 reps - Forearm Pronation and Supination with Hammer  - 1 x daily - 7 x weekly - 3 sets - 10 reps - Seated Wrist Extension with Dumbbell  - 1 x daily - 7 x weekly - 3 sets - 10 reps  GOALS (at 7/9 re-cert): Goals reviewed with patient? Yes  SHORT TERM GOALS:   Target date: 01/20/2024  Pt will perform rolling left and right from supine at no more than minA in order to demonstrate progression back to PLOF. Baseline: mod-maxA over recent 2 weeks (7/9); minA (8/12) Goal status: MET  2.  Pt will be able to reach outside of short sitting BOS and recover at no more than mod I level to demonstrate functional core strengthening. Baseline: min-modA (7/9), min A (8/7) Goal status: IN PROGRESS  LONG TERM GOALS:  Target date: 02/17/2024  Pt will perform sit to stand transfer with LRAD with min A Baseline: max A to stedy (4/4), max A in // bars (4/25), max A in // bars (5/22), max A to +2 in // bars (6/18); modA (7/9); maxA //  bars (9/17) Goal status: NOT MET  2.  Pt to tolerate standing x 6 min using upright posture with LRAD to demonstrate improved endurance. Baseline: 30 sec in stedy (initial), 2:35 min in // bars (4/25), 3 min in // bars (5/22), 4 min in // bars (6/18); 5 minutes 4 seconds in // bars (7/9); unable (9/17) Goal status: NOT MET  3.  Pt will be able to obtain static long sitting position with no more than minA in order to promote improved functional positioning at home. Baseline: modA for trunk and LE support into forward positioning (7/9);  modA (9/17) Goal status: NOT MET  GOALS (at 0/82 re-cert): Goals reviewed with patient? Yes  SHORT TERM GOALS:   Target date: 03/30/2024  Pt will be independent and compliant with advanced and finalized PROM/stretching, strengthening, and standing based HEP in order to maintain functional progress and improve mobility. Baseline: Established, pt intermittently compliant based on appt schedule (9/17) Goal status: INITIAL  LONG TERM GOALS:  Target date: 04/27/2024  Pt will perform sit to stand transfer with LRAD with min A Baseline: max A to stedy (4/4), max A in // bars (4/25), max A in // bars (5/22), max A to +2 in // bars (6/18); modA (7/9); maxA // bars (9/17) Goal status: ONGOING  2.  Pt to tolerate standing x 1 min using upright posture with LRAD at no more than modA to demonstrate improved endurance and return to functional baseline. Baseline: 30 sec in stedy (initial), 2:35 min in // bars (4/25), 3 min in // bars (5/22), 4 min in // bars (6/18); 5 minutes 4 seconds in // bars (7/9); unable (9/17) Goal status: REVISED  ASSESSMENT:  CLINICAL IMPRESSION: Emphasis of skilled PT session on working on core strengthening and UB strengthening while in long-sitting position on mat table. Pt requires CGA to min A for core control in long-sitting position. She does continue to exhibit UB weakness with decreased grip strength in her R hand as compared to her L hand, needs hand ACE-wrapped to dowel rod. She continues to benefit from skilled PT services to work towards LTGs. Continue POC.   OBJECTIVE IMPAIRMENTS: decreased balance, decreased endurance, decreased mobility, difficulty walking, decreased ROM, decreased strength, increased edema, impaired perceived functional ability, increased muscle spasms, impaired flexibility, impaired sensation, impaired tone, impaired UE functional use, postural dysfunction, and pain.   ACTIVITY LIMITATIONS: carrying, lifting, bending, standing, stairs,  transfers, bed mobility, continence, bathing, toileting, dressing, reach over head, and hygiene/grooming  PARTICIPATION LIMITATIONS: meal prep, cleaning, laundry, driving, shopping, community activity, and occupation  PERSONAL FACTORS: Age, Sex, Time since onset of injury/illness/exacerbation, and 1-2 comorbidities:   C7 ASIA C- incomplete quad w/ neurogenic bladder and bowel, HLD, Hx of skin cancerare also affecting patient's functional outcome.   REHAB POTENTIAL: Good  CLINICAL DECISION MAKING: Stable/uncomplicated  EVALUATION COMPLEXITY: High  PLAN:  PT FREQUENCY: 2x/week + 1x/wk  PT DURATION: 8 weeks + 8 wks + 8 wks  PLANNED INTERVENTIONS: [id number]- PT Re-evaluation, 97750- Physical Performance Testing, 97110-Therapeutic exercises, 97530- Therapeutic activity, [id number]- Neuromuscular re-education, 97535- Self Care, [id number]- Manual therapy, [id number]- Gait training, [phone]- Orthotic Initial, [phone]- Electrical stimulation (manual), [phone] (1-2 muscles), [id number] (3+ muscles)- Dry Needling, Patient/Family education, Balance training, Stair training, Taping, Joint mobilization, Scar mobilization, Compression bandaging, DME instructions, Wheelchair mobility training, Cryotherapy, and Moist heat  PLAN FOR NEXT SESSION: sit to stands - work on initiation and lifting from seat, standing tolerance with stedy, in // bars, possibly with RW, core strengthening/stability,  endurance; wants to have conversation about taking a break (3-6 months) after this POC near end of this POC, standing lateral weight shifts, mini-squats in standing?, Yoga block press-ups vs push-up blocks, long-sitting - unsupported for core work?, bump transfers with and without slide board - pt wants to work on this skill!, lateral leans, work on mini squats in standing frame, prone UB strengthening, rolling L/R on mat and breaking down parts of rolling  Waddell Southgate, PT Waddell Southgate, PT, DPT, CSRS       03/22/2024, 11:49  AM

## 2024-03-23 DIAGNOSIS — H353121 Nonexudative age-related macular degeneration, left eye, early dry stage: Secondary | ICD-10-CM | POA: Diagnosis not present

## 2024-03-23 DIAGNOSIS — H2513 Age-related nuclear cataract, bilateral: Secondary | ICD-10-CM | POA: Diagnosis not present

## 2024-03-23 DIAGNOSIS — D3132 Benign neoplasm of left choroid: Secondary | ICD-10-CM | POA: Diagnosis not present

## 2024-03-23 DIAGNOSIS — H353113 Nonexudative age-related macular degeneration, right eye, advanced atrophic without subfoveal involvement: Secondary | ICD-10-CM | POA: Diagnosis not present

## 2024-03-24 ENCOUNTER — Ambulatory Visit: Payer: Self-pay | Admitting: Internal Medicine

## 2024-03-24 DIAGNOSIS — R7989 Other specified abnormal findings of blood chemistry: Secondary | ICD-10-CM

## 2024-03-24 DIAGNOSIS — Z79899 Other long term (current) drug therapy: Secondary | ICD-10-CM

## 2024-03-24 DIAGNOSIS — D649 Anemia, unspecified: Secondary | ICD-10-CM

## 2024-03-24 NOTE — Progress Notes (Signed)
 Liver tests now normal . Ibc saturation thus iron now  is normal  ferritin is high but thinking from inflammation?   But dont want to  get too much iron    suggest  not taking an iron and then recheck   ibc ferritin and cbcdiff in  4-6 months

## 2024-03-26 NOTE — Telephone Encounter (Signed)
Future labs placed. 

## 2024-03-27 ENCOUNTER — Ambulatory Visit: Admitting: Physical Therapy

## 2024-03-27 DIAGNOSIS — M25641 Stiffness of right hand, not elsewhere classified: Secondary | ICD-10-CM | POA: Diagnosis not present

## 2024-03-27 DIAGNOSIS — R278 Other lack of coordination: Secondary | ICD-10-CM

## 2024-03-27 DIAGNOSIS — M6281 Muscle weakness (generalized): Secondary | ICD-10-CM | POA: Diagnosis not present

## 2024-03-27 DIAGNOSIS — R293 Abnormal posture: Secondary | ICD-10-CM | POA: Diagnosis not present

## 2024-03-27 DIAGNOSIS — R1312 Dysphagia, oropharyngeal phase: Secondary | ICD-10-CM | POA: Diagnosis not present

## 2024-03-27 DIAGNOSIS — G8253 Quadriplegia, C5-C7 complete: Secondary | ICD-10-CM | POA: Diagnosis not present

## 2024-03-27 DIAGNOSIS — G8254 Quadriplegia, C5-C7 incomplete: Secondary | ICD-10-CM | POA: Diagnosis not present

## 2024-03-27 DIAGNOSIS — R498 Other voice and resonance disorders: Secondary | ICD-10-CM | POA: Diagnosis not present

## 2024-03-27 DIAGNOSIS — R29898 Other symptoms and signs involving the musculoskeletal system: Secondary | ICD-10-CM | POA: Diagnosis not present

## 2024-03-27 NOTE — Telephone Encounter (Signed)
I added lab orders.

## 2024-03-27 NOTE — Therapy (Signed)
 OUTPATIENT PHYSICAL THERAPY NEURO TREATMENT   Patient Name: [name] MRN: [id number] DOB:[birth year], 73 y.o., female Today's Date: 03/27/2024   PCP: [name], MD REFERRING PROVIDER: [name], MD    END OF SESSION:   PT End of Session - 03/27/24 1104     Visit Number 44    Number of Visits 53   45 + 8 at re-cert   Date for Recertification  05/04/24   to allow for scheduling delays/known upcoming pt conflicts   Authorization Type [employer] MEDICARE    Progress Note Due on Visit 50    PT Start Time 1100    PT Stop Time 1153    PT Time Calculation (min) 53 min    Equipment Utilized During Treatment Gait belt    Activity Tolerance Patient tolerated treatment well    Behavior During Therapy [facility] for tasks assessed/performed                   Past Medical History:  Diagnosis Date   Arthritis 2019   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 Aneita   Fibroid 2004   Per Dr. Lenon   History of shingles    face and mouth   Hx of skin cancer, basal cell    Hyperlipidemia 2022   lower legs and feet   Rosacea    Sciatica of left side 09/28/2013   Scoliosis    noted on mri done for back pain   Past Surgical History:  Procedure Laterality Date   BUNIONECTOMY     SPINE SURGERY  07/28/20   fused C5-C6   Patient Active Problem List   Diagnosis Date Noted   Hematuria 12/28/2023   Medication management 12/26/2023   Buttock wound, left, subsequent encounter 03/16/2023   Bronchiectasis with acute exacerbation (HCC) 03/15/2023   Buttock wound, left, initial encounter 11/02/2022   Orthostatic hypotension 08/13/2022   Neurogenic bowel 05/03/2022   Spasticity 05/03/2022   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/03/2022   Nerve pain 05/03/2022   Medication monitoring encounter 01/08/2022   Neurogenic bladder 10/11/2021   Urinary incontinence 10/11/2021   ESBL (extended  spectrum beta-lactamase) producing bacteria infection 10/09/2021   Recurrent UTI 10/09/2021   Quadriplegia, C5-C7 incomplete (HCC) 01/16/2021   History of spinal fracture 01/16/2021   Suprapubic catheter (HCC) 01/16/2021   Encounter for routine gynecological examination 09/28/2013   Onychomycosis 09/28/2013   Foot deformity, acquired 03/26/2012   Encounter for preventive health examination 12/25/2010   ROSACEA 08/25/2009   Disturbance in sleep behavior 03/11/2008   SKIN CANCER, HX OF 03/11/2008   DYSURIA, HX OF 03/11/2008   Hyperlipidemia 02/10/2007   CERVICALGIA 02/10/2007    ONSET DATE: 08/29/2023 (referral date)  REFERRING DIAG: G82.54 (ICD-10-CM) - Incomplete quadriplegia at C5-C8 level (HCC)  THERAPY DIAG:  Muscle weakness (generalized)  Quadriplegia, C5-C7 incomplete (HCC)  Abnormal posture  Other lack of coordination  Rationale for Evaluation and Treatment: Rehabilitation  SUBJECTIVE:  SUBJECTIVE STATEMENT:  Pt denies any acute changes since last visit. Pt has stood in her standing frame 2x since last visit for 45 min each. She also has used her seated stepper 2x and will use it again today when she gets home.  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 Yes: NPRS scale: 3-4 Pain location: elbows to fingertips on both arms Pain description: nerve pain Aggravating factors: not stated Relieving factors: not stated  PRECAUTIONS: Fall and Other: osteoporosis  RED FLAGS: None   WEIGHT BEARING RESTRICTIONS: No  FALLS: Has patient fallen in last 6 months? Yes. Number of falls 1 fall in [place]  that resulted in B femur fractures  LIVING  ENVIRONMENT: Lives with: lives with their spouse and and with full-time caregiver [name] Lives in: House/apartment Home is power wheelchair accessible Has following equipment at home: Wheelchair (power), Wheelchair (manual), Grab bars, Ramped entry, and standing frame, slide board  PLOF: Independent with household mobility with device, Independent with community mobility with device, Requires assistive device for independence, Needs assistance with ADLs, and Needs assistance with transfers  PATIENT GOALS: still working on stand and pivot with goal to pivot to commode or to a chair work on core-will help me with standing improve my stamina - being in the hospital I lost strength/endurance   OBJECTIVE:  Note: Objective measures were completed at Evaluation unless otherwise noted.  DIAGNOSTIC FINDINGS: None update/relevant to this POC  COGNITION: Overall cognitive status: Within functional limits for tasks assessed   SENSATION: Decreased sensation in BUE and BLE secondary to incomplete quadriplegia Decreased sensation in proximal LLE as compared to distal LE  EDEMA:  Circumferential: R knee: 17; L knee: 17.5 and Figure 8: R ankle 21, L ankle 21.5  MUSCLE TONE: increased spasticity in BLE   POSTURE: rounded shoulders and forward head  LOWER EXTREMITY ROM:     Passive  Right Eval Left Eval  Hip flexion Tight hip flexors Tight hip flexors  Hip extension    Hip abduction    Hip adduction    Hip internal rotation    Hip external rotation    Knee flexion Tight HS Tight HS  Knee extension    Ankle dorsiflexion Decreased, tight gastroc Decreased, tight gastroc  Ankle plantarflexion    Ankle inversion    Ankle eversion     (Blank rows = not tested)  LOWER EXTREMITY MMT:    MMT Right Eval Left Eval  Hip flexion 1 2-  Hip extension    Hip abduction    Hip adduction    Hip internal rotation    Hip external rotation    Knee flexion 0 3  Knee extension 2- 2-   Ankle dorsiflexion 2- 3  Ankle plantarflexion    Ankle inversion    Ankle eversion    (Blank rows = not tested)  BED MOBILITY:  From previous POC: Sit to supine Mod A Supine to sit Mod A Rolling to Right Mod A Rolling to Left Mod A Undulating mattress for wound management on standard bed (elevated-so often doing uphill sliding board transfers); she would like to continue working on sitting up independently, she has been working on rolling, needs less assistance w/ this when someone props her leg into hooklying; would like something to help her pull her left leg to her butt for stretching as well as bed  mobility.  TRANSFERS: From previous POC: Pt continues using combination of bump over, slide board, and depression (squat pivot) transfers.                                                                                                                              TREATMENT:   TherAct Pt received seated in her PWC. Slide board transfer PWC to mat table with min A. Reivew of UB strengthening HEP: - Supine Elbow Flexion Extension with Dumbbell  with 2# wrist weights - Seated Single Arm Elbow Extension Push-up on Table x 5 reps B - Wheelchair Push-Up (AKA)  x 5 reps - Seated Biceps Curl  x 2# weighted dowel x 10 reps - Forearm Pronation and Supination with 1# dumbbell x 5 reps B - Seated Wrist Extension with Dumbbell  with 1# dumbbell x 5 reps B Pt performs short sit to supine transfer with mod A for BLE management. Supine to sit with max A for BLE management and trunk elevation. Slide board transfer mat table to PWC with min A. Pt left seated in her PWC with [name] present to assist with repositioning and clothing management.   Discussed pt's goals leading up to d/c from OPPT in a few visits. Reviewed things we have worked on in PT sessions that would be safe for her to continue to work on at home. Provided the following list for patient and [name] and can be updated/addended as needed  leading up to her discharge:   [facility] Ideas  Rolling in bed Prone (laying on stomach in bed), pressing up with arms Long sitting in bed Reaching for objects Short sitting in wheelchair Reaching for objects Standing frame Seated pedaler/exercise bike Arm strengthening routine with hand weights and/or weighted 3# dowel    PATIENT EDUCATION: Education details: see above Person educated: Patient and Arts administrator Education method: Explanation, Demonstration, Tactile cues, and Verbal cues Education comprehension: verbalized understanding and returned demonstration  HOME EXERCISE PROGRAM: Access Code: [email or web address] URL: [email or web address] Date: 11/22/2023 Prepared by: Waddell Southgate  Exercises - Supine Elbow Flexion Extension with Dumbbell  - 1 x daily - 7 x weekly - 3 sets - 10 reps - Seated Single Arm Elbow Extension Push-up on Table  - 1 x daily - 7 x weekly - 3 sets - 10 reps - Wheelchair Push-Up (AKA)  - 1 x daily - 7 x weekly - 3 sets - 5 reps - Seated Biceps Curl  - 1 x daily - 7 x weekly - 3 sets - 10 reps - Forearm Pronation and Supination with Hammer  - 1 x daily - 7 x weekly - 3 sets - 10 reps - Seated Wrist Extension with Dumbbell  - 1 x daily - 7 x weekly - 3 sets - 10 reps  GOALS (at 7/9 re-cert): Goals reviewed with patient? Yes  SHORT TERM GOALS:   Target date: 01/20/2024  Pt will perform rolling left and right  from supine at no more than minA in order to demonstrate progression back to PLOF. Baseline: mod-maxA over recent 2 weeks (7/9); minA (8/12) Goal status: MET  2.  Pt will be able to reach outside of short sitting BOS and recover at no more than mod I level to demonstrate functional core strengthening. Baseline: min-modA (7/9), min A (8/7) Goal status: IN PROGRESS  LONG TERM GOALS:  Target date: 02/17/2024  Pt will perform sit to stand transfer with LRAD with min A Baseline: max A to stedy (4/4), max A in // bars (4/25), max  A in // bars (5/22), max A to +2 in // bars (6/18); modA (7/9); maxA // bars (9/17) Goal status: NOT MET  2.  Pt to tolerate standing x 6 min using upright posture with LRAD to demonstrate improved endurance. Baseline: 30 sec in stedy (initial), 2:35 min in // bars (4/25), 3 min in // bars (5/22), 4 min in // bars (6/18); 5 minutes 4 seconds in // bars (7/9); unable (9/17) Goal status: NOT MET  3.  Pt will be able to obtain static long sitting position with no more than minA in order to promote improved functional positioning at home. Baseline: modA for trunk and LE support into forward positioning (7/9); modA (9/17) Goal status: NOT MET  GOALS (at 0/82 re-cert): Goals reviewed with patient? Yes  SHORT TERM GOALS:   Target date: 03/30/2024  Pt will be independent and compliant with advanced and finalized PROM/stretching, strengthening, and standing based HEP in order to maintain functional progress and improve mobility. Baseline: Established, pt intermittently compliant based on appt schedule (9/17), met (10/14) Goal status: MET  LONG TERM GOALS:  Target date: 04/27/2024  Pt will perform sit to stand transfer with LRAD with min A Baseline: max A to stedy (4/4), max A in // bars (4/25), max A in // bars (5/22), max A to +2 in // bars (6/18); modA (7/9); maxA // bars (9/17) Goal status: ONGOING  2.  Pt to tolerate standing x 1 min using upright posture with LRAD at no more than modA to demonstrate improved endurance and return to functional baseline. Baseline: 30 sec in stedy (initial), 2:35 min in // bars (4/25), 3 min in // bars (5/22), 4 min in // bars (6/18); 5 minutes 4 seconds in // bars (7/9); unable (9/17) Goal status: REVISED  ASSESSMENT:  CLINICAL IMPRESSION: Emphasis of skilled PT session on assessing STG, discussing plan for transition to d/c from formal PT and discussion of what patient can continue working on at home, and reviewing her UB strengthening HEP. She has met  1/1 STG due to being independent with HEP and standing program at home. Provided a list of what things we have performed in PT sessions that she would be safe to continue working on at home with assist from [place] (caregiver). Pt and caregiver to come back next week with questions regarding list provided. Modified her UB strengthening HEP and provided her with a new handout. She continues to benefit from skilled PT services to work towards LTGs. Continue POC.   OBJECTIVE IMPAIRMENTS: decreased balance, decreased endurance, decreased mobility, difficulty walking, decreased ROM, decreased strength, increased edema, impaired perceived functional ability, increased muscle spasms, impaired flexibility, impaired sensation, impaired tone, impaired UE functional use, postural dysfunction, and pain.   ACTIVITY LIMITATIONS: carrying, lifting, bending, standing, stairs, transfers, bed mobility, continence, bathing, toileting, dressing, reach over head, and hygiene/grooming  PARTICIPATION LIMITATIONS: meal prep, cleaning, laundry, driving, shopping, community activity,  and occupation  PERSONAL FACTORS: Age, Sex, Time since onset of injury/illness/exacerbation, and 1-2 comorbidities:   C7 ASIA C- incomplete quad w/ neurogenic bladder and bowel, HLD, Hx of skin cancerare also affecting patient's functional outcome.   REHAB POTENTIAL: Good  CLINICAL DECISION MAKING: Stable/uncomplicated  EVALUATION COMPLEXITY: High  PLAN:  PT FREQUENCY: 2x/week + 1x/wk  PT DURATION: 8 weeks + 8 wks + 8 wks  PLANNED INTERVENTIONS: 02835- PT Re-evaluation, 97750- Physical Performance Testing, 97110-Therapeutic exercises, 97530- Therapeutic activity, 97112- Neuromuscular re-education, 97535- Self Care, 02859- Manual therapy, [phone]- Gait training, [phone]- Orthotic Initial, [phone]- Electrical stimulation (manual), [phone] (1-2 muscles), [phone] (3+ muscles)- Dry Needling, Patient/Family education, Balance training, Stair training,  Taping, Joint mobilization, Scar mobilization, Compression bandaging, DME instructions, Wheelchair mobility training, Cryotherapy, and Moist heat  PLAN FOR NEXT SESSION: sit to stands - work on initiation and lifting from seat, standing tolerance with stedy, in // bars, possibly with RW, core strengthening/stability, endurance; wants to have conversation about taking a break (3-6 months) after this POC near end of this POC, standing lateral weight shifts, mini-squats in standing?, Yoga block press-ups vs push-up blocks, long-sitting - unsupported for core work?, bump transfers with and without slide board - pt wants to work on this skill!, lateral leans, work on mini squats in standing frame, prone UB strengthening, rolling L/R on mat and breaking down parts of rolling  Any questions over handout from last visit or UB strengthening HEP?  Keeven Matty, PT Waddell Southgate, PT, DPT, CSRS       03/27/2024, 11:53 AM

## 2024-03-27 NOTE — Telephone Encounter (Signed)
 Add  hemocchromatosis dna to lab orders

## 2024-03-29 DIAGNOSIS — F4321 Adjustment disorder with depressed mood: Secondary | ICD-10-CM | POA: Diagnosis not present

## 2024-04-04 ENCOUNTER — Encounter: Payer: Self-pay | Admitting: Physical Medicine and Rehabilitation

## 2024-04-05 ENCOUNTER — Ambulatory Visit (HOSPITAL_COMMUNITY)
Admission: RE | Admit: 2024-04-05 | Discharge: 2024-04-05 | Disposition: A | Discharge status: Home / Self Care | Source: Ambulatory Visit | Attending: Internal Medicine | Admitting: Internal Medicine

## 2024-04-05 ENCOUNTER — Ambulatory Visit: Admitting: Physical Therapy

## 2024-04-05 DIAGNOSIS — R1313 Dysphagia, pharyngeal phase: Secondary | ICD-10-CM | POA: Insufficient documentation

## 2024-04-05 DIAGNOSIS — R29898 Other symptoms and signs involving the musculoskeletal system: Secondary | ICD-10-CM | POA: Diagnosis not present

## 2024-04-05 DIAGNOSIS — G8254 Quadriplegia, C5-C7 incomplete: Secondary | ICD-10-CM

## 2024-04-05 DIAGNOSIS — R278 Other lack of coordination: Secondary | ICD-10-CM | POA: Diagnosis not present

## 2024-04-05 DIAGNOSIS — R498 Other voice and resonance disorders: Secondary | ICD-10-CM

## 2024-04-05 DIAGNOSIS — R1312 Dysphagia, oropharyngeal phase: Secondary | ICD-10-CM

## 2024-04-05 DIAGNOSIS — R1310 Dysphagia, unspecified: Secondary | ICD-10-CM

## 2024-04-05 DIAGNOSIS — R293 Abnormal posture: Secondary | ICD-10-CM

## 2024-04-05 DIAGNOSIS — M6281 Muscle weakness (generalized): Secondary | ICD-10-CM

## 2024-04-05 DIAGNOSIS — M25641 Stiffness of right hand, not elsewhere classified: Secondary | ICD-10-CM | POA: Diagnosis not present

## 2024-04-05 DIAGNOSIS — G8253 Quadriplegia, C5-C7 complete: Secondary | ICD-10-CM | POA: Diagnosis not present
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
## 2024-04-05 NOTE — Therapy (Signed)
 OUTPATIENT PHYSICAL THERAPY NEURO TREATMENT   Patient Name: [name] MRN: [id number] DOB:09/29/1950, 73 y.o., female Today's Date: 04/05/2024   PCP: [name], MD REFERRING PROVIDER: [name], MD    END OF SESSION:   PT End of Session - 04/05/24 1102     Visit Number 45    Number of Visits 53   45 + 8 at re-cert   Date for Recertification  05/04/24   to allow for scheduling delays/known upcoming pt conflicts   Authorization Type [employer]IC[employer]    Progress Note Due on Visit 50    PT Start Time 1100    PT Stop Time 1145    PT Time Calculation (min) 45 min    Equipment Utilized During Treatment Gait belt    Activity Tolerance Patient tolerated treatment well    Behavior During Therapy [facility] for tasks assessed/performed                    Past Medical History:  Diagnosis Date   Arthritis 2019   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 Aneita   Fibroid 2004   Per Dr. Lenon   History of shingles    face and mouth   Hx of skin cancer, basal cell    Hyperlipidemia 2022   lower legs and feet   Rosacea    Sciatica of left side 09/28/2013   Scoliosis    noted on mri done for back pain   Past Surgical History:  Procedure Laterality Date   BUNIONECTOMY     SPINE SURGERY  07/28/20   fused C5-C6   Patient Active Problem List   Diagnosis Date Noted   Hematuria 12/28/2023   Medication management 12/26/2023   Buttock wound, left, subsequent encounter 03/16/2023   Bronchiectasis with acute exacerbation (HCC) 03/15/2023   Buttock wound, left, initial encounter 11/02/2022   Orthostatic hypotension 08/13/2022   Neurogenic bowel 05/03/2022   Spasticity 05/03/2022   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/03/2022   Nerve pain 05/03/2022   Medication monitoring encounter 01/08/2022   Neurogenic bladder 10/11/2021   Urinary incontinence 10/11/2021   ESBL (extended  spectrum beta-lactamase) producing bacteria infection 10/09/2021   Recurrent UTI 10/09/2021   Quadriplegia, C5-C7 incomplete (HCC) 01/16/2021   History of spinal fracture 01/16/2021   Suprapubic catheter (HCC) 01/16/2021   Encounter for routine gynecological examination 09/28/2013   Onychomycosis 09/28/2013   Foot deformity, acquired 03/26/2012   Encounter for preventive health examination 12/25/2010   ROSACEA 08/25/2009   Disturbance in sleep behavior 03/11/2008   SKIN CANCER, HX OF 03/11/2008   DYSURIA, HX OF 03/11/2008   Hyperlipidemia 02/10/2007   CERVICALGIA 02/10/2007    ONSET DATE: 08/29/2023 (referral date)  REFERRING DIAG: G82.54 (ICD-10-CM) - Incomplete quadriplegia at C5-C8 level (HCC)  THERAPY DIAG:  Muscle weakness (generalized)  Quadriplegia, C5-C7 incomplete (HCC)  Abnormal posture  Other symptoms and signs involving the musculoskeletal system  Rationale for Evaluation and Treatment: Rehabilitation  SUBJECTIVE:  SUBJECTIVE STATEMENT:  Pt denies any acute changes since last visit. Pt has worked on long sitting in bed and getting into prone in bed.  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 Yes: NPRS scale: 3-4 Pain location: elbows to fingertips on both arms Pain description: nerve pain Aggravating factors: not stated Relieving factors: not stated  PRECAUTIONS: Fall and Other: osteoporosis  RED FLAGS: None   WEIGHT BEARING RESTRICTIONS: No  FALLS: Has patient fallen in last 6 months? Yes. Number of falls 1 fall in [place]  that resulted in B femur fractures  LIVING ENVIRONMENT: Lives with: lives with their spouse and and with  full-time caregiver [name] Lives in: House/apartment Home is power wheelchair accessible Has following equipment at home: Wheelchair (power), Wheelchair (manual), Grab bars, Ramped entry, and standing frame, slide board  PLOF: Independent with household mobility with device, Independent with community mobility with device, Requires assistive device for independence, Needs assistance with ADLs, and Needs assistance with transfers  PATIENT GOALS: still working on stand and pivot with goal to pivot to commode or to a chair work on core-will help me with standing improve my stamina - being in the hospital I lost strength/endurance   OBJECTIVE:  Note: Objective measures were completed at Evaluation unless otherwise noted.  DIAGNOSTIC FINDINGS: None update/relevant to this POC  COGNITION: Overall cognitive status: Within functional limits for tasks assessed   SENSATION: Decreased sensation in BUE and BLE secondary to incomplete quadriplegia Decreased sensation in proximal LLE as compared to distal LE  EDEMA:  Circumferential: R knee: 17; L knee: 17.5 and Figure 8: R ankle 21, L ankle 21.5  MUSCLE TONE: increased spasticity in BLE   POSTURE: rounded shoulders and forward head  LOWER EXTREMITY ROM:     Passive  Right Eval Left Eval  Hip flexion Tight hip flexors Tight hip flexors  Hip extension    Hip abduction    Hip adduction    Hip internal rotation    Hip external rotation    Knee flexion Tight HS Tight HS  Knee extension    Ankle dorsiflexion Decreased, tight gastroc Decreased, tight gastroc  Ankle plantarflexion    Ankle inversion    Ankle eversion     (Blank rows = not tested)  LOWER EXTREMITY MMT:    MMT Right Eval Left Eval  Hip flexion 1 2-  Hip extension    Hip abduction    Hip adduction    Hip internal rotation    Hip external rotation    Knee flexion 0 3  Knee extension 2- 2-  Ankle dorsiflexion 2- 3  Ankle plantarflexion    Ankle inversion     Ankle eversion    (Blank rows = not tested)  BED MOBILITY:  From previous POC: Sit to supine Mod A Supine to sit Mod A Rolling to Right Mod A Rolling to Left Mod A Undulating mattress for wound management on standard bed (elevated-so often doing uphill sliding board transfers); she would like to continue working on sitting up independently, she has been working on rolling, needs less assistance w/ this when someone props her leg into hooklying; would like something to help her pull her left leg to her butt for stretching as well as bed mobility.  TRANSFERS: From previous POC: Pt continues using combination of bump over, slide board, and depression (squat pivot)  transfers.                                                                                                                              TREATMENT:   TherAct Pt received seated in her PWC. Slide board transfer PWC to mat table with min A. Sit to supine mod A for BLE management BLE PROM in supine to prevent contracture: Hip flexion, IR, ER HS stretch Butterfly stretch Ankle DF, eversion, inversion BLE strengthening: Bridges 5 x 10 reps with 5 sec hold Assist to hold her BLE into hooklying position Heel slides x 5 reps B Max A needed on R side Min A needed on L side Feels R lower core activating Slide board transfer mat table to PWC with min A. Pt left seated in her PWC with [name] present to assist with repositioning and clothing management.     PATIENT EDUCATION: Education details: see above Person educated: Patient and Arts administrator Education method: Explanation, Demonstration, Tactile cues, and Verbal cues Education comprehension: verbalized understanding and returned demonstration  HOME EXERCISE PROGRAM: Access Code: [email or web address] URL: [email or web address] Date: 11/22/2023 Prepared by: Waddell Southgate  Exercises - Supine Elbow Flexion Extension with Dumbbell  - 1 x daily - 7 x weekly - 3  sets - 10 reps - Seated Single Arm Elbow Extension Push-up on Table  - 1 x daily - 7 x weekly - 3 sets - 10 reps - Wheelchair Push-Up (AKA)  - 1 x daily - 7 x weekly - 3 sets - 5 reps - Seated Biceps Curl  - 1 x daily - 7 x weekly - 3 sets - 10 reps - Forearm Pronation and Supination with Hammer  - 1 x daily - 7 x weekly - 3 sets - 10 reps - Seated Wrist Extension with Dumbbell  - 1 x daily - 7 x weekly - 3 sets - 10 reps  GOALS (at 7/9 re-cert): Goals reviewed with patient? Yes  SHORT TERM GOALS:   Target date: 01/20/2024  Pt will perform rolling left and right from supine at no more than minA in order to demonstrate progression back to PLOF. Baseline: mod-maxA over recent 2 weeks (7/9); minA (8/12) Goal status: MET  2.  Pt will be able to reach outside of short sitting BOS and recover at no more than mod I level to demonstrate functional core strengthening. Baseline: min-modA (7/9), min A (8/7) Goal status: IN PROGRESS  LONG TERM GOALS:  Target date: 02/17/2024  Pt will perform sit to stand transfer with LRAD with min A Baseline: max A to stedy (4/4), max A in // bars (4/25), max A in // bars (5/22), max A to +2 in // bars (6/18); modA (7/9); maxA // bars (9/17) Goal status: NOT MET  2.  Pt to tolerate standing x 6 min using upright posture with LRAD to demonstrate improved endurance. Baseline: 30 sec in stedy (initial), 2:35 min in // bars (4/25), 3 min  in // bars (5/22), 4 min in // bars (6/18); 5 minutes 4 seconds in // bars (7/9); unable (9/17) Goal status: NOT MET  3.  Pt will be able to obtain static long sitting position with no more than minA in order to promote improved functional positioning at home. Baseline: modA for trunk and LE support into forward positioning (7/9); modA (9/17) Goal status: NOT MET  GOALS (at 0/82 re-cert): Goals reviewed with patient? Yes  SHORT TERM GOALS:   Target date: 03/30/2024  Pt will be independent and compliant with advanced and finalized  PROM/stretching, strengthening, and standing based HEP in order to maintain functional progress and improve mobility. Baseline: Established, pt intermittently compliant based on appt schedule (9/17), met (10/14) Goal status: MET  LONG TERM GOALS:  Target date: 04/27/2024  Pt will perform sit to stand transfer with LRAD with min A Baseline: max A to stedy (4/4), max A in // bars (4/25), max A in // bars (5/22), max A to +2 in // bars (6/18); modA (7/9); maxA // bars (9/17) Goal status: ONGOING  2.  Pt to tolerate standing x 1 min using upright posture with LRAD at no more than modA to demonstrate improved endurance and return to functional baseline. Baseline: 30 sec in stedy (initial), 2:35 min in // bars (4/25), 3 min in // bars (5/22), 4 min in // bars (6/18); 5 minutes 4 seconds in // bars (7/9); unable (9/17) Goal status: REVISED  ASSESSMENT:  CLINICAL IMPRESSION: Emphasis of skilled PT session on working on BLE stretching and strengthening exercises in supine on mat table. Pt and caregiver [name] have been working on stretching at home but have not been doing hip IR/ER or ankle inversion/eversion so will add those to their routine. Also reviewed supine BLE strengthening exercises. Pt with good B glute activation (glute med only on R side) but not able to clear mat table. See above regarding HS curls. Pt with very trace activation in her R HS but with good activation on her L side. She continues to benefit from skilled PT services to work towards LTGs. Continue POC.    OBJECTIVE IMPAIRMENTS: decreased balance, decreased endurance, decreased mobility, difficulty walking, decreased ROM, decreased strength, increased edema, impaired perceived functional ability, increased muscle spasms, impaired flexibility, impaired sensation, impaired tone, impaired UE functional use, postural dysfunction, and pain.   ACTIVITY LIMITATIONS: carrying, lifting, bending, standing, stairs, transfers, bed mobility,  continence, bathing, toileting, dressing, reach over head, and hygiene/grooming  PARTICIPATION LIMITATIONS: meal prep, cleaning, laundry, driving, shopping, community activity, and occupation  PERSONAL FACTORS: Age, Sex, Time since onset of injury/illness/exacerbation, and 1-2 comorbidities:   C7 ASIA C- incomplete quad w/ neurogenic bladder and bowel, HLD, Hx of skin cancerare also affecting patient's functional outcome.   REHAB POTENTIAL: Good  CLINICAL DECISION MAKING: Stable/uncomplicated  EVALUATION COMPLEXITY: High  PLAN:  PT FREQUENCY: 2x/week + 1x/wk  PT DURATION: 8 weeks + 8 wks + 8 wks  PLANNED INTERVENTIONS: [id number]- PT Re-evaluation, 97750- Physical Performance Testing, 97110-Therapeutic exercises, 97530- Therapeutic activity, [id number]- Neuromuscular re-education, 97535- Self Care, [id number]- Manual therapy, [id number]- Gait training, [phone]- Orthotic Initial, [phone]- Electrical stimulation (manual), [phone] (1-2 muscles), [id number] (3+ muscles)- Dry Needling, Patient/Family education, Balance training, Stair training, Taping, Joint mobilization, Scar mobilization, Compression bandaging, DME instructions, Wheelchair mobility training, Cryotherapy, and Moist heat  PLAN FOR NEXT SESSION: sit to stands - work on initiation and lifting from seat, standing tolerance with stedy, in // bars, possibly with RW, core strengthening/stability, endurance; wants  to have conversation about taking a break (3-6 months) after this POC near end of this POC, standing lateral weight shifts, mini-squats in standing?, Yoga block press-ups vs push-up blocks, long-sitting - unsupported for core work?, bump transfers with and without slide board - pt wants to work on this skill!, lateral leans, work on mini squats in standing frame, prone UB strengthening, rolling L/R on mat and breaking down parts of rolling, print out bridges and HS curls  Waddell Southgate, PT Waddell Southgate, PT, DPT, CSRS       04/05/2024, 11:45  AM

## 2024-04-10 ENCOUNTER — Other Ambulatory Visit: Payer: Self-pay

## 2024-04-10 ENCOUNTER — Ambulatory Visit: Admitting: Infectious Diseases

## 2024-04-10 ENCOUNTER — Encounter: Payer: Self-pay | Admitting: Infectious Diseases

## 2024-04-10 VITALS — BP 117/68 | HR 70 | Temp 97.3°F

## 2024-04-10 DIAGNOSIS — Z9359 Other cystostomy status: Secondary | ICD-10-CM

## 2024-04-10 DIAGNOSIS — N319 Neuromuscular dysfunction of bladder, unspecified: Secondary | ICD-10-CM | POA: Diagnosis not present

## 2024-04-10 DIAGNOSIS — R32 Unspecified urinary incontinence: Secondary | ICD-10-CM | POA: Diagnosis not present

## 2024-04-10 DIAGNOSIS — G8250 Quadriplegia, unspecified: Secondary | ICD-10-CM

## 2024-04-10 DIAGNOSIS — N390 Urinary tract infection, site not specified: Secondary | ICD-10-CM | POA: Diagnosis not present

## 2024-04-10 DIAGNOSIS — J479 Bronchiectasis, uncomplicated: Secondary | ICD-10-CM | POA: Diagnosis not present

## 2024-04-10 DIAGNOSIS — Z5181 Encounter for therapeutic drug level monitoring: Secondary | ICD-10-CM | POA: Diagnosis not present

## 2024-04-10 DIAGNOSIS — R918 Other nonspecific abnormal finding of lung field: Secondary | ICD-10-CM

## 2024-04-10 NOTE — Progress Notes (Addendum)
 Patient Active Problem List   Diagnosis Date Noted   Hematuria 12/28/2023   Medication management 12/26/2023   Buttock wound, left, subsequent encounter 03/16/2023   Bronchiectasis with acute exacerbation (HCC) 03/15/2023   Buttock wound, left, initial encounter 11/02/2022   Orthostatic hypotension 08/13/2022   Neurogenic bowel 05/03/2022   Spasticity 05/03/2022   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/03/2022   Nerve pain 05/03/2022   Medication monitoring encounter 01/08/2022   Neurogenic bladder 10/11/2021   Urinary incontinence 10/11/2021   ESBL (extended spectrum beta-lactamase) producing bacteria infection 10/09/2021   Recurrent UTI 10/09/2021   Quadriplegia, C5-C7 incomplete (HCC) 01/16/2021   History of spinal fracture 01/16/2021   Suprapubic catheter (HCC) 01/16/2021   Encounter for routine gynecological examination 09/28/2013   Onychomycosis 09/28/2013   Foot deformity, acquired 03/26/2012   Encounter for preventive health examination 12/25/2010   ROSACEA 08/25/2009   Disturbance in sleep behavior 03/11/2008   SKIN CANCER, HX OF 03/11/2008   DYSURIA, HX OF 03/11/2008   Hyperlipidemia 02/10/2007   CERVICALGIA 02/10/2007    Patient's Medications  New Prescriptions   No medications on file  Previous Medications   ACETAMINOPHEN (TYLENOL) 500 MG TABLET    Take 500 mg by mouth every 6 (six) hours as needed.   ASCORBIC ACID  (VITAMIN C) 1000 MG TABLET    Take 1 tablet (1,000 mg total) by mouth in the morning, at noon, in the evening, and at bedtime.   ATORVASTATIN  (LIPITOR) 20 MG TABLET    TAKE 1 TABLET BY MOUTH EVERY DAY   BACLOFEN  (LIORESAL ) 20 MG TABLET    Increasing baclofen  to 40 mg 4x/day- after leg fractures- for spasticity-   BISACODYL (DULCOLAX) 10 MG SUPPOSITORY    Place 10 mg rectally as needed for moderate constipation. Insert one suppository per rectum with each bowel program procedure.   CHOLECALCIFEROL (VITAMIN D3) 25 MCG (1000 UNIT) TABLET    Take 1,000 Units by  mouth daily. 2000u   [employer] COENZYME Q-10 100 MG CAPSULE    Take 100 mg by mouth daily.    DOCUSATE SODIUM  (DSS) 100 MG CAPS    Take by mouth.   DOCUSATE SODIUM  (ENEMEEZ) 283 MG ENEMA    Place 1 enema (283 mg total) rectally daily. Place enemeez- as a mini enema into rectum after doing dig stim for at least 30 seconds- and then use as a replacement for suppository when you do bowel program   FAMOTIDINE (PEPCID) 20 MG TABLET    Take 20 mg by mouth 2 (two) times daily.   FESOTERODINE  (TOVIAZ ) 8 MG TB24 TABLET    Take 1 tablet (8 mg total) by mouth daily.   FORTEO  560 MCG/2.24ML SOPN    INJECT 20 MCG UNDER THE SKIN 1 TIME A DAY. DISCARD PEN 28 DAYS AFTER INITIAL USE   GABAPENTIN  (NEURONTIN ) 600 MG TABLET    TAKE 2 TABLETS (1,200 MG TOTAL) BY MOUTH 3 (THREE) TIMES DAILY.   METHENAMINE  (HIPREX ) 1 G TABLET    Take 1 tablet (1 g total) by mouth 2 (two) times daily with a meal.   METRONIDAZOLE  (METROGEL ) 1 % GEL    APPLY TOPICALLY EVERY DAY   MULTIPLE VITAMINS-MINERALS (CENTRUM SILVER ULTRA WOMENS PO)    Take by mouth.   MUPIROCIN  OINTMENT (BACTROBAN ) 2 %    Apply 1 Application topically 2 (two) times daily.   MYRBETRIQ 50 MG TB24 TABLET    TAKE 1 TABLET BY MOUTH EVERY DAY   NAPROXEN SODIUM (ALEVE) 220 MG TABLET  Take 220 mg by mouth. Tablet p.o per package directions as needed for pain   NORTRIPTYLINE  (PAMELOR ) 50 MG CAPSULE    Take 50 mg by mouth at bedtime.   SENNA CO    by Combination route. Sennosides 8.6mg  tab p.o per package directions daily as needed to promote bowel movement.   TIZANIDINE  (ZANAFLEX ) 4 MG TABLET    TAKE 0.5-1 TABLETS (2-4 MG TOTAL) BY MOUTH 2 (TWO) TIMES DAILY AS NEEDED FOR MUSCLE SPASMS. TO TAKE WITH BACLOFEN - FOR INCREASING SPASTICITY IN SCI PATIENT- NEEDS BOTH MEDS-   UNABLE TO FIND    Med Name: Saline Enema Per rectum per package directions as needed for relief of constipation.   ZOLPIDEM  (AMBIEN ) 5 MG TABLET    Take 0.5 tablets (2.5 mg total) by mouth at bedtime as needed for  sleep.  Modified Medications   No medications on file  Discontinued Medications   No medications on file    Subjective: 73 YO Female with h/o Incomplete quadriplegia with neurogenic bladder s/p SPC with urinary incontinence who is here for fu in the setting of recurrent UTI. 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 Denies any UTIs so far. She has an aide who helps with [facility] exchange and follows up with Urology as needed.   03/15/23 Accompanied by ger caretaker. Admitted 9/22 -9/25 for CAP/Bronchiectasis. Received Iv meropenem/azithromycin  in the hospital which was switched to PO augmentin  on discharge to complete 7 days. Last day of po augmentin  is tomorrow. Shortness of breath and chest pain has improved but still has some dry cough. She feels significantly better. Denies fevers, chills. Not taking methenamine  due to being on abtx since hospitalized and plan to resume tomorrow evening, She is taking Vit c however. No UTIs since last seen. Went to [facility] recently for inpatient intensive PT and vefy pleased with the therapy. No refills needed and she knows to call clinic in case she runs out of pills. Reports her left buttock wound has healed/closed.   7/14 She is accompanied by her aide. She had urine cx done on 4/7 growing MRSE, reports she does not remember taking antibiotics that time although macrobid  prescribed.  Most recently, she had urine cx done on 7/8- NG. She is on Macrobid  for six to seven days with one to two days remaining. She had symptoms of urgency and leakage when urine checked that have improved since starting the antibiotic.SABRA She has a suprapubic catheter changed every four weeks. She had blood in urine since December and has an upcoming appt with Urology in mid August. She has been consistently taking methenamine  and Vit C without any concerns.   She has been closely following with wound  care and wound in left buttock had closed per last visit with wound care on 6/26.   Going to [place] in 10 days. No other complaints.   04/10/24 She is accompanied by her aide today. She had an episode of UTI while at [facility] in July when she was treated with 7 days of ciprofloxacin.  Most recently, treated with 7 days course of levofloxacin for urine cx 9/21 growing Klebsiella pneumonia and Stenotrophomonas maltophilia ( see media, had hematuria that time). Cystoscopy was done on 9/15 by urology and was unremarkable.  No complaints of hematuria.  Seen by PCP 10/8 and LFTs was normal.  She is also following PMR. Doing well otherwise.   Discussed about unclear definite  duration of continuation of methenamine  as she has already been more than 2 years into treatment. She would like to continue as long as she can tolerate esp given she feels they are  preventing it from having recurrent UTIs.    Review of Systems: all systems reviewed with pertinent positives and negatives as listed above.   Past Medical History:  Diagnosis Date   Arthritis 2019   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 Aneita   Fibroid 2004   Per Dr. Lenon   History of shingles    face and mouth   Hx of skin cancer, basal cell    Hyperlipidemia 2022   lower legs and feet   Rosacea    Sciatica of left side 09/28/2013   Scoliosis    noted on mri done for back pain   Past Surgical History:  Procedure Laterality Date   BUNIONECTOMY     SPINE SURGERY  07/28/20   fused C5-C6    Social History   Tobacco Use   Smoking status: Never   Smokeless tobacco: Never  Vaping Use   Vaping status: Never Used  Substance Use Topics   Alcohol use: Not Currently    Alcohol/week: 7.0 standard drinks of alcohol    Types: 7 Glasses of wine per week   Drug use: Yes    Comment: wine at night    Family History  Problem Relation Age of  Onset   Arthritis Mother    Hypertension Father    Early death Father    Osteoporosis Other    Early death Sister    Hypertension Sister    Early death Brother    Breast cancer Neg Hx     No Known Allergies  Health Maintenance  Topic Date Due   COVID-19 Vaccine (12 - 2025-26 season) 02/13/2024   Fecal DNA (Cologuard)  10/11/2024   Mammogram  11/16/2024   Medicare Annual Wellness (AWV)  01/31/2025   DTaP/Tdap/Td (3 - Td or Tdap) 04/17/2032   Pneumococcal Vaccine: 50+ Years  Completed   Influenza Vaccine  Completed   DEXA SCAN  Completed   Hepatitis C Screening  Completed   Zoster Vaccines- Shingrix  Completed   Meningococcal B Vaccine  Aged Out   Hepatitis B Vaccines 19-59 Average Risk  Discontinued   Colonoscopy  Discontinued    Objective: BP 117/68   Pulse 70   Temp (!) 97.3 F (36.3 C) (Temporal)   SpO2 98%    Physical Exam Constitutional:      Appearance: Normal appearance.  HENT:     Head: Normocephalic and atraumatic.      Mouth: Mucous membranes are moist.  Eyes:    Conjunctiva/sclera: Conjunctivae normal.     Pupils:  Cardiovascular:     Rate and Rhythm: Normal rate and regular rhythm.     Heart sounds:  Pulmonary:     Effort: Pulmonary effort is normal on RA    Breath sounds:   Abdominal:     General: Non distended     Palpations: soft. SPC with a urobag - OK with no concerns   Musculoskeletal:        General: sitting in a wheel chair   Skin:    General: Skin is warm and dry.     Comments:  Neurological:     General: quadriplegia  Mental Status: awake, alert and oriented to person, place, and time.   Psychiatric:        Mood and Affect: Mood normal.   Lab Results Lab Results  Component Value Date   WBC 4.4 12/20/2023   HGB 12.7 12/20/2023   HCT 36.8 12/20/2023   MCV 91.6 12/20/2023   PLT 179.0 12/20/2023    Lab Results  Component Value Date   CREATININE 0.36 (L) 09/14/2023   BUN 17 09/14/2023   NA 138 09/14/2023   K  4.3 09/14/2023   CL 98 09/14/2023   CO2 33 (H) 09/14/2023    Lab Results  Component Value Date   ALT 18 03/21/2024   AST 21 03/21/2024   ALKPHOS 57 03/21/2024   BILITOT 0.4 03/21/2024    Lab Results  Component Value Date   CHOL 136 10/14/2022   HDL 53.80 10/14/2022   LDLCALC 70 10/14/2022   LDLDIRECT 162.3 03/21/2012   TRIG 64.0 10/14/2022   CHOLHDL 3 10/14/2022   No results found for: LABRPR, RPRTITER No results found for: HIV1RNAQUANT, HIV1RNAVL, CD4TABS  Microbiology     Procedure  Cystoscopy 02/27/24 There was no evidence of any tumors, trabeculation, diverticula, stones, foreign bodies, ulcerations or bleeding vessels along the anterior, posterior, left or right bladder walls. The ureteral orifices are noted to be in the usual anatomic position. The trigone was unremarkable.    The cystoscope was then slowly withdrawn into the  tract and no abnormalities were noted.  There were no complications.    Findings  Nl SP tube tract , no stones or lesions ; no evidence of squamous changes  CT chest 03/06/23 1. Extensive mucoid impaction within the right lower lobe with  associated cylindrical bronchiectasis, right lower lobe greater than  right middle lobe tree-in-bud nodularity and centrilobular groundglass  nodules, and right lower lobe dependent consolidation with small pleural  effusion. Constellation of imaging findings is most in keeping with an  infectious/inflammatory etiology, including however not limited to  atypical mycobacterial infection. Recommend correlation with  bronchoscopy. Chest CT follow-up within 1-3 months recommended.  2. Sub-5 mm nodular foci within the trachea may represent mucous/debris  however small tracheal polyps not excluded. This could be best  characterized with bronchoscopy.  3. Moderately advanced coronary artery calcification. Consider  correlation with dedicated coronary calcium  scoring CT for risk  stratification as  clinically warranted.  4. Sequela of remote granulomatous disease.   Unless the patient's specific circumstances suggest otherwise, any liver  lesion 0.5 cm or less, any cystic kidney lesion less than 1.0 cm, and/or  any adrenal lesion 1.0 cm or less not otherwise characterized in this  report as possessing suspicious or indeterminate imaging features is/are  most likely to be benign and do not require follow-up imaging or biopsy.   Assessment/Plan # Recurrent UTI  - continue methenamine  and vitamin C as is. No need for refills currently  - 10/8 LFT discussed, normal  - Side effects reviewed related to GI, liver, kidney and bladder, rash, headache etc, no concerns. DDIS reviewed. She prefers to be on methenamine  as long as she has no issues and it is effective in preventing frequent UTIs - fu in 4 months   # Urinary cultures + for kleb pneumo and Stenotrophomonas - from [facility], was treated with levofloxacin for 7 days but could be a colonizer - no current concerns   # Incomplete Quadriplegia  # Neurogenic Bladder s/p SPC # Urinary Incontinence - On Fesoterodine  and Mirabegron -  Follows Urology   # Bronchiectasis/multiple abnormal findings in CT chest  - needs to fu with Pulmonology   # Immunization counseling - got Flu and covid vaccine    I spent 25 minutes involved in face-to-face and non-face-to-face activities for this patient on the day of the visit. Professional time spent includes the following activities: Preparing to see the patient (review of tests), Obtaining and reviewing separately obtained history (PCP note 10/8, PMR note 10/1, Podiatry note 9/16, Urology note 9/15, Endocrinology note 01/17/24), Performing a medically appropriate examination and evaluation,  Documenting clinical information in the EMR, Independently interpreting results (not separately reported), Communicating results to the patient/caregiver, Counseling and educating the patient/caregiver.  Annalee Joseph, MD [facility] 04/10/2024, 10:42 AM

## 2024-04-10 NOTE — Telephone Encounter (Signed)
   Going into a resting position helps tremors she has.  Usually sets off due to a lot of exertion-  yesterday didn't have any tremors Couple times per week- that it occurs. It goes away immediately.   Would agree, when reaching out- seems to set it off.     Plan: We discussed could be spasms- suggest doing Range of motion esp of shoulders and elbows-  If not, helpful, then let me know, and I can send a referral to Neurology.

## 2024-04-11 ENCOUNTER — Encounter: Payer: Self-pay | Admitting: Speech Pathology

## 2024-04-11 DIAGNOSIS — F4321 Adjustment disorder with depressed mood: Secondary | ICD-10-CM | POA: Diagnosis not present

## 2024-04-12 ENCOUNTER — Ambulatory Visit: Admitting: Physical Therapy

## 2024-04-12 DIAGNOSIS — R1312 Dysphagia, oropharyngeal phase: Secondary | ICD-10-CM | POA: Diagnosis not present

## 2024-04-12 DIAGNOSIS — G8253 Quadriplegia, C5-C7 complete: Secondary | ICD-10-CM

## 2024-04-12 DIAGNOSIS — M25641 Stiffness of right hand, not elsewhere classified: Secondary | ICD-10-CM | POA: Diagnosis not present

## 2024-04-12 DIAGNOSIS — R293 Abnormal posture: Secondary | ICD-10-CM

## 2024-04-12 DIAGNOSIS — R29898 Other symptoms and signs involving the musculoskeletal system: Secondary | ICD-10-CM | POA: Diagnosis not present

## 2024-04-12 DIAGNOSIS — M6281 Muscle weakness (generalized): Secondary | ICD-10-CM

## 2024-04-12 DIAGNOSIS — R278 Other lack of coordination: Secondary | ICD-10-CM | POA: Diagnosis not present

## 2024-04-12 DIAGNOSIS — G8254 Quadriplegia, C5-C7 incomplete: Secondary | ICD-10-CM

## 2024-04-12 DIAGNOSIS — R498 Other voice and resonance disorders: Secondary | ICD-10-CM | POA: Diagnosis not present

## 2024-04-12 NOTE — Therapy (Signed)
 OUTPATIENT PHYSICAL THERAPY NEURO TREATMENT   Patient Name: [name] MRN: [id number] DOB:1951/04/02, 73 y.o., female Today's Date: 04/12/2024   PCP: [name], MD REFERRING PROVIDER: [name], MD    END OF SESSION:   PT End of Session - 04/12/24 1103     Visit Number 46    Number of Visits 53   45 + 8 at re-cert   Date for Recertification  05/04/24   to allow for scheduling delays/known upcoming pt conflicts   Authorization Type [employer]IC[employer]    Progress Note Due on Visit 50    PT Start Time 1100    PT Stop Time 1145    PT Time Calculation (min) 45 min    Equipment Utilized During Treatment Gait belt    Activity Tolerance Patient tolerated treatment well    Behavior During Therapy [facility] for tasks assessed/performed                     Past Medical History:  Diagnosis Date   Arthritis 2019   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 Aneita   Fibroid 2004   Per Dr. Lenon   History of shingles    face and mouth   Hx of skin cancer, basal cell    Hyperlipidemia 2022   lower legs and feet   Rosacea    Sciatica of left side 09/28/2013   Scoliosis    noted on mri done for back pain   Past Surgical History:  Procedure Laterality Date   BUNIONECTOMY     SPINE SURGERY  07/28/20   fused C5-C6   Patient Active Problem List   Diagnosis Date Noted   Hematuria 12/28/2023   Medication management 12/26/2023   Buttock wound, left, subsequent encounter 03/16/2023   Bronchiectasis with acute exacerbation (HCC) 03/15/2023   Buttock wound, left, initial encounter 11/02/2022   Orthostatic hypotension 08/13/2022   Neurogenic bowel 05/03/2022   Spasticity 05/03/2022   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/03/2022   Nerve pain 05/03/2022   Medication monitoring encounter 01/08/2022   Neurogenic bladder 10/11/2021   Urinary incontinence 10/11/2021   ESBL  (extended spectrum beta-lactamase) producing bacteria infection 10/09/2021   Recurrent UTI 10/09/2021   Quadriplegia, C5-C7 incomplete (HCC) 01/16/2021   History of spinal fracture 01/16/2021   Suprapubic catheter (HCC) 01/16/2021   Encounter for routine gynecological examination 09/28/2013   Onychomycosis 09/28/2013   Foot deformity, acquired 03/26/2012   Encounter for preventive health examination 12/25/2010   ROSACEA 08/25/2009   Disturbance in sleep behavior 03/11/2008   SKIN CANCER, HX OF 03/11/2008   DYSURIA, HX OF 03/11/2008   Hyperlipidemia 02/10/2007   CERVICALGIA 02/10/2007    ONSET DATE: 08/29/2023 (referral date)  REFERRING DIAG: G82.54 (ICD-10-CM) - Incomplete quadriplegia at C5-C8 level (HCC)  THERAPY DIAG:  Muscle weakness (generalized)  Quadriplegia, C5-C7 incomplete (HCC)  Abnormal posture  Quadriplegia, C5-C7 complete (HCC)  Rationale for Evaluation and Treatment: Rehabilitation  SUBJECTIVE:  SUBJECTIVE STATEMENT:  Pt denies any acute changes since last visit.    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 Yes: NPRS scale: 3-4 Pain location: elbows to fingertips on both arms Pain description: nerve pain Aggravating factors: not stated Relieving factors: not stated  PRECAUTIONS: Fall and Other: osteoporosis  RED FLAGS: None   WEIGHT BEARING RESTRICTIONS: No  FALLS: Has patient fallen in last 6 months? Yes. Number of falls 1 fall in [place]  that resulted in B femur fractures  LIVING ENVIRONMENT: Lives with: lives with their spouse and and with full-time caregiver [name] Lives in: House/apartment Home is power wheelchair  accessible Has following equipment at home: Wheelchair (power), Wheelchair (manual), Grab bars, Ramped entry, and standing frame, slide board  PLOF: Independent with household mobility with device, Independent with community mobility with device, Requires assistive device for independence, Needs assistance with ADLs, and Needs assistance with transfers  PATIENT GOALS: still working on stand and pivot with goal to pivot to commode or to a chair work on core-will help me with standing improve my stamina - being in the hospital I lost strength/endurance   OBJECTIVE:  Note: Objective measures were completed at Evaluation unless otherwise noted.  DIAGNOSTIC FINDINGS: None update/relevant to this POC  COGNITION: Overall cognitive status: Within functional limits for tasks assessed   SENSATION: Decreased sensation in BUE and BLE secondary to incomplete quadriplegia Decreased sensation in proximal LLE as compared to distal LE  EDEMA:  Circumferential: R knee: 17; L knee: 17.5 and Figure 8: R ankle 21, L ankle 21.5  MUSCLE TONE: increased spasticity in BLE   POSTURE: rounded shoulders and forward head  LOWER EXTREMITY ROM:     Passive  Right Eval Left Eval  Hip flexion Tight hip flexors Tight hip flexors  Hip extension    Hip abduction    Hip adduction    Hip internal rotation    Hip external rotation    Knee flexion Tight HS Tight HS  Knee extension    Ankle dorsiflexion Decreased, tight gastroc Decreased, tight gastroc  Ankle plantarflexion    Ankle inversion    Ankle eversion     (Blank rows = not tested)  LOWER EXTREMITY MMT:    MMT Right Eval Left Eval  Hip flexion 1 2-  Hip extension    Hip abduction    Hip adduction    Hip internal rotation    Hip external rotation    Knee flexion 0 3  Knee extension 2- 2-  Ankle dorsiflexion 2- 3  Ankle plantarflexion    Ankle inversion    Ankle eversion    (Blank rows = not tested)  BED MOBILITY:  From  previous POC: Sit to supine Mod A Supine to sit Mod A Rolling to Right Mod A Rolling to Left Mod A Undulating mattress for wound management on standard bed (elevated-so often doing uphill sliding board transfers); she would like to continue working on sitting up independently, she has been working on rolling, needs less assistance w/ this when someone props her leg into hooklying; would like something to help her pull her left leg to her butt for stretching as well as bed mobility.  TRANSFERS: From previous POC: Pt continues using combination of bump over, slide board, and depression (squat pivot) transfers.  TREATMENT:   TherAct Pt received seated in her PWC. Slide board transfer PWC to mat table with min A. Reassessed edema in BLE per pt request and compared to initial assessment: 09/09/2023 Circumferential: R knee: 17; L knee: 17.5 and Figure 8: R ankle 21, L ankle 21.5  04/12/2024 Circumferential: R knee: 15 5/16; L knee: 15 9/16 and Figure 8: R ankle 18.75, L ankle 20 Discussed postural stability and external support for upright posture to prevent trunk flexion/kyphosis and increased difficulty breathing. Currently recommending more of an LSO-type brace vs an entire postural brace that encompasses shoulders as pt does have some shoulder muscle strength and would lose strength if relying on a brace. However, due to her incomplete SCI she has poor core strength/control and could benefit from external support for this. Also discussed postural stabilization exercises she can work on while semi-reclined in her PWC, will provide handout next visit. Scap squeezes/rows Shoulder flexion Shoulder abduction Shoulder scaption Short-sitting balance EOM with close SBA to min A for balance while reaching outside BOS and across midline for cone retrieval of cones on floor  and on mat table. Pt noted to have ongoing tremors in BUE (R>L) that reduce as task progresses. Slide board transfer mat table to PWC with min A. Pt left seated in her PWC with [name] present to assist with repositioning and clothing management.    PATIENT EDUCATION: Education details: see above Person educated: Patient and Arts Administrator Education method: Explanation, Demonstration, Tactile cues, and Verbal cues Education comprehension: verbalized understanding and returned demonstration  HOME EXERCISE PROGRAM: Access Code: [email or web address] URL: [email or web address] Date: 11/22/2023 Prepared by: Waddell Southgate  Exercises - Supine Elbow Flexion Extension with Dumbbell  - 1 x daily - 7 x weekly - 3 sets - 10 reps - Seated Single Arm Elbow Extension Push-up on Table  - 1 x daily - 7 x weekly - 3 sets - 10 reps - Wheelchair Push-Up (AKA)  - 1 x daily - 7 x weekly - 3 sets - 5 reps - Seated Biceps Curl  - 1 x daily - 7 x weekly - 3 sets - 10 reps - Forearm Pronation and Supination with Hammer  - 1 x daily - 7 x weekly - 3 sets - 10 reps - Seated Wrist Extension with Dumbbell  - 1 x daily - 7 x weekly - 3 sets - 10 reps - Supine Bridge  - 1 x daily - 7 x weekly - 3 sets - 10 reps - Supine Heel Slide  - 1 x daily - 7 x weekly - 3 sets - 10 reps  - Seated Scapular Retraction  - 1 x daily - 7 x weekly - 3 sets - 10 reps - 5 sec hold - Seated Shoulder Flexion Full Range  - 1 x daily - 7 x weekly - 3 sets - 10 reps - Seated Shoulder Scaption AROM  - 1 x daily - 7 x weekly - 3 sets - 10 reps - Seated Shoulder Abduction  - 1 x daily - 7 x weekly - 3 sets - 10 reps  GOALS (at 7/9 re-cert): Goals reviewed with patient? Yes  SHORT TERM GOALS:   Target date: 01/20/2024  Pt will perform rolling left and right from supine at no more than minA in order to demonstrate progression back to PLOF. Baseline: mod-maxA over recent 2 weeks (7/9); minA (8/12) Goal status: MET  2.  Pt will be able  to reach outside of short sitting BOS and  recover at no more than mod I level to demonstrate functional core strengthening. Baseline: min-modA (7/9), min A (8/7) Goal status: IN PROGRESS  LONG TERM GOALS:  Target date: 02/17/2024  Pt will perform sit to stand transfer with LRAD with min A Baseline: max A to stedy (4/4), max A in // bars (4/25), max A in // bars (5/22), max A to +2 in // bars (6/18); modA (7/9); maxA // bars (9/17) Goal status: NOT MET  2.  Pt to tolerate standing x 6 min using upright posture with LRAD to demonstrate improved endurance. Baseline: 30 sec in stedy (initial), 2:35 min in // bars (4/25), 3 min in // bars (5/22), 4 min in // bars (6/18); 5 minutes 4 seconds in // bars (7/9); unable (9/17) Goal status: NOT MET  3.  Pt will be able to obtain static long sitting position with no more than minA in order to promote improved functional positioning at home. Baseline: modA for trunk and LE support into forward positioning (7/9); modA (9/17) Goal status: NOT MET  GOALS (at 0/82 re-cert): Goals reviewed with patient? Yes  SHORT TERM GOALS:   Target date: 03/30/2024  Pt will be independent and compliant with advanced and finalized PROM/stretching, strengthening, and standing based HEP in order to maintain functional progress and improve mobility. Baseline: Established, pt intermittently compliant based on appt schedule (9/17), met (10/14) Goal status: MET  LONG TERM GOALS:  Target date: 04/27/2024  Pt will perform sit to stand transfer with LRAD with min A Baseline: max A to stedy (4/4), max A in // bars (4/25), max A in // bars (5/22), max A to +2 in // bars (6/18); modA (7/9); maxA // bars (9/17) Goal status: ONGOING  2.  Pt to tolerate standing x 1 min using upright posture with LRAD at no more than modA to demonstrate improved endurance and return to functional baseline. Baseline: 30 sec in stedy (initial), 2:35 min in // bars (4/25), 3 min in // bars (5/22), 4  min in // bars (6/18); 5 minutes 4 seconds in // bars (7/9); unable (9/17) Goal status: REVISED  ASSESSMENT:  CLINICAL IMPRESSION: Emphasis of skilled PT session on assessing BLE edema as compared to initial assessment, working on short-sitting balance, discussing pros/cones of external bracing for posture as well as postural strengthening exercises. Pt exhibits decreased BLE edema at knees and ankles as compared to initial assessment. She continues to exhibit impaired core strength/control consistent with her SCI level. Plan to provide handout for postural stabilization exercises next visit. Also plan to assess LTG and d/c from OPPT services next visit. Continue POC.    OBJECTIVE IMPAIRMENTS: decreased balance, decreased endurance, decreased mobility, difficulty walking, decreased ROM, decreased strength, increased edema, impaired perceived functional ability, increased muscle spasms, impaired flexibility, impaired sensation, impaired tone, impaired UE functional use, postural dysfunction, and pain.   ACTIVITY LIMITATIONS: carrying, lifting, bending, standing, stairs, transfers, bed mobility, continence, bathing, toileting, dressing, reach over head, and hygiene/grooming  PARTICIPATION LIMITATIONS: meal prep, cleaning, laundry, driving, shopping, community activity, and occupation  PERSONAL FACTORS: Age, Sex, Time since onset of injury/illness/exacerbation, and 1-2 comorbidities:   C7 ASIA C- incomplete quad w/ neurogenic bladder and bowel, HLD, Hx of skin cancerare also affecting patient's functional outcome.   REHAB POTENTIAL: Good  CLINICAL DECISION MAKING: Stable/uncomplicated  EVALUATION COMPLEXITY: High  PLAN:  PT FREQUENCY: 2x/week + 1x/wk  PT DURATION: 8 weeks + 8 wks + 8 wks  PLANNED INTERVENTIONS: 97164- PT Re-evaluation, 97750- Physical  Performance Testing, 97110-Therapeutic exercises, 97530- Therapeutic activity, [id number]- Neuromuscular re-education, [id number]- Self Care, [id number]-  Manual therapy, [phone]- Gait training, [phone]- Orthotic Initial, [phone]- Electrical stimulation (manual), [phone] (1-2 muscles), [phone] (3+ muscles)- Dry Needling, Patient/Family education, Balance training, Stair training, Taping, Joint mobilization, Scar mobilization, Compression bandaging, DME instructions, Wheelchair mobility training, Cryotherapy, and Moist heat  PLAN FOR NEXT SESSION: print out postural stabilization exercises, assess LTG and d/c  Waddell Southgate, PT Waddell Southgate, PT, DPT, CSRS       04/12/2024, 11:48 AM

## 2024-04-17 ENCOUNTER — Encounter: Payer: Self-pay | Admitting: Internal Medicine

## 2024-04-17 ENCOUNTER — Ambulatory Visit: Payer: Self-pay | Attending: Physical Medicine and Rehabilitation | Admitting: Physical Therapy

## 2024-04-17 DIAGNOSIS — R29898 Other symptoms and signs involving the musculoskeletal system: Secondary | ICD-10-CM | POA: Insufficient documentation

## 2024-04-17 DIAGNOSIS — R498 Other voice and resonance disorders: Secondary | ICD-10-CM | POA: Diagnosis not present

## 2024-04-17 DIAGNOSIS — M6281 Muscle weakness (generalized): Secondary | ICD-10-CM | POA: Insufficient documentation

## 2024-04-17 DIAGNOSIS — R29818 Other symptoms and signs involving the nervous system: Secondary | ICD-10-CM | POA: Insufficient documentation

## 2024-04-17 DIAGNOSIS — G8254 Quadriplegia, C5-C7 incomplete: Secondary | ICD-10-CM | POA: Diagnosis not present

## 2024-04-17 DIAGNOSIS — R293 Abnormal posture: Secondary | ICD-10-CM | POA: Diagnosis not present

## 2024-04-17 NOTE — Therapy (Signed)
 OUTPATIENT PHYSICAL THERAPY NEURO TREATMENT - DISCHARGE NOTE***   Patient Name: [name] MRN: [id number] DOB:03/18/1951, 73 y.o., female Today's Date: 04/17/2024   PCP: [name], MD REFERRING PROVIDER: [name], MD    END OF SESSION:   PT End of Session - 04/17/24 1147     Visit Number 47    Number of Visits 53   45 + 8 at re-cert   Date for Recertification  05/04/24   to allow for scheduling delays/known upcoming pt conflicts   Authorization Type [employer]IC[employer]    Progress Note Due on Visit 50    PT Start Time 1145    PT Stop Time 1230    PT Time Calculation (min) 45 min    Equipment Utilized During Treatment Gait belt    Activity Tolerance Patient tolerated treatment well    Behavior During Therapy [facility] for tasks assessed/performed                      Past Medical History:  Diagnosis Date   Arthritis 2019   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 Aneita   Fibroid 2004   Per Dr. Lenon   History of shingles    face and mouth   Hx of skin cancer, basal cell    Hyperlipidemia 2022   lower legs and feet   Rosacea    Sciatica of left side 09/28/2013   Scoliosis    noted on mri done for back pain   Past Surgical History:  Procedure Laterality Date   BUNIONECTOMY     SPINE SURGERY  07/28/20   fused C5-C6   Patient Active Problem List   Diagnosis Date Noted   Hematuria 12/28/2023   Medication management 12/26/2023   Buttock wound, left, subsequent encounter 03/16/2023   Bronchiectasis with acute exacerbation (HCC) 03/15/2023   Buttock wound, left, initial encounter 11/02/2022   Orthostatic hypotension 08/13/2022   Neurogenic bowel 05/03/2022   Spasticity 05/03/2022   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/03/2022   Nerve pain 05/03/2022   Medication monitoring encounter 01/08/2022   Neurogenic bladder 10/11/2021   Urinary incontinence  10/11/2021   ESBL (extended spectrum beta-lactamase) producing bacteria infection 10/09/2021   Recurrent UTI 10/09/2021   Quadriplegia, C5-C7 incomplete (HCC) 01/16/2021   History of spinal fracture 01/16/2021   Suprapubic catheter (HCC) 01/16/2021   Encounter for routine gynecological examination 09/28/2013   Onychomycosis 09/28/2013   Foot deformity, acquired 03/26/2012   Encounter for preventive health examination 12/25/2010   ROSACEA 08/25/2009   Disturbance in sleep behavior 03/11/2008   SKIN CANCER, HX OF 03/11/2008   DYSURIA, HX OF 03/11/2008   Hyperlipidemia 02/10/2007   CERVICALGIA 02/10/2007    ONSET DATE: 08/29/2023 (referral date)  REFERRING DIAG: G82.54 (ICD-10-CM) - Incomplete quadriplegia at C5-C8 level (HCC)  THERAPY DIAG:  Muscle weakness (generalized)  Quadriplegia, C5-C7 incomplete (HCC)  Abnormal posture  Other symptoms and signs involving the musculoskeletal system  Other symptoms and signs involving the nervous system  Rationale for Evaluation and Treatment: Rehabilitation  SUBJECTIVE:  SUBJECTIVE STATEMENT:  Pt denies any acute changes since last visit. Pt has gotten up to 800 steps on her stepper at home, at 62 steps/min. At one point she got up to 100 steps/min.   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 Yes: NPRS scale: 3-4 Pain location: elbows to fingertips on both arms Pain description: nerve pain Aggravating factors: not stated Relieving factors: not stated  PRECAUTIONS: Fall and Other: osteoporosis  RED FLAGS: None   WEIGHT BEARING RESTRICTIONS: No  FALLS: Has patient fallen in last 6 months? Yes. Number of falls 1  fall in [place]  that resulted in B femur fractures  LIVING ENVIRONMENT: Lives with: lives with their spouse and and with full-time caregiver [name] Lives in: House/apartment Home is power wheelchair accessible Has following equipment at home: Wheelchair (power), Wheelchair (manual), Grab bars, Ramped entry, and standing frame, slide board  PLOF: Independent with household mobility with device, Independent with community mobility with device, Requires assistive device for independence, Needs assistance with ADLs, and Needs assistance with transfers  PATIENT GOALS: still working on stand and pivot with goal to pivot to commode or to a chair work on core-will help me with standing improve my stamina - being in the hospital I lost strength/endurance   OBJECTIVE:  Note: Objective measures were completed at Evaluation unless otherwise noted.  DIAGNOSTIC FINDINGS: None update/relevant to this POC  COGNITION: Overall cognitive status: Within functional limits for tasks assessed   SENSATION: Decreased sensation in BUE and BLE secondary to incomplete quadriplegia Decreased sensation in proximal LLE as compared to distal LE  EDEMA:  Circumferential: R knee: 17; L knee: 17.5 and Figure 8: R ankle 21, L ankle 21.5  MUSCLE TONE: increased spasticity in BLE   POSTURE: rounded shoulders and forward head  LOWER EXTREMITY ROM:     Passive  Right Eval Left Eval  Hip flexion Tight hip flexors Tight hip flexors  Hip extension    Hip abduction    Hip adduction    Hip internal rotation    Hip external rotation    Knee flexion Tight HS Tight HS  Knee extension    Ankle dorsiflexion Decreased, tight gastroc Decreased, tight gastroc  Ankle plantarflexion    Ankle inversion    Ankle eversion     (Blank rows = not tested)  LOWER EXTREMITY MMT:    MMT Right Eval Left Eval  Hip flexion 1 2-  Hip extension    Hip abduction    Hip adduction    Hip internal rotation    Hip  external rotation    Knee flexion 0 3  Knee extension 2- 2-  Ankle dorsiflexion 2- 3  Ankle plantarflexion    Ankle inversion    Ankle eversion    (Blank rows = not tested)  BED MOBILITY:  From previous POC: Sit to supine Mod A Supine to sit Mod A Rolling to Right Mod A Rolling to Left Mod A Undulating mattress for wound management on standard bed (elevated-so often doing uphill sliding board transfers); she would like to continue working on sitting up independently, she has been working on rolling, needs less assistance w/ this when someone props her leg into hooklying; would like something to help her pull her left leg to her butt for stretching as well as bed mobility.  TRANSFERS: From previous POC: Pt continues  using combination of bump over, slide board, and depression (squat pivot) transfers.                                                                                                                              TREATMENT:   TherAct Pt received seated in her PWC. Slide board transfer PWC to mat table with min A. Reassessed edema in BLE per pt request and compared to initial assessment: 09/09/2023 Circumferential: R knee: 17; L knee: 17.5 and Figure 8: R ankle 21, L ankle 21.5  04/12/2024 Circumferential: R knee: 15 5/16; L knee: 15 9/16 and Figure 8: R ankle 18.75, L ankle 20 Discussed postural stability and external support for upright posture to prevent trunk flexion/kyphosis and increased difficulty breathing. Currently recommending more of an LSO-type brace vs an entire postural brace that encompasses shoulders as pt does have some shoulder muscle strength and would lose strength if relying on a brace. However, due to her incomplete SCI she has poor core strength/control and could benefit from external support for this. Also discussed postural stabilization exercises she can work on while semi-reclined in her PWC, will provide handout next visit. Scap  squeezes/rows Shoulder flexion Shoulder abduction Shoulder scaption Short-sitting balance EOM with close SBA to min A for balance while reaching outside BOS and across midline for cone retrieval of cones on floor and on mat table. Pt noted to have ongoing tremors in BUE (R>L) that reduce as task progresses. Slide board transfer mat table to PWC with min A. Pt left seated in her PWC with [name] present to assist with repositioning and clothing management. ***    PATIENT EDUCATION: Education details: see above*** Person educated: Patient and Arts Administrator Education method: Explanation, Demonstration, Tactile cues, and Verbal cues Education comprehension: verbalized understanding and returned demonstration  HOME EXERCISE PROGRAM: Access Code: [email or web address] URL: [email or web address] Date: 11/22/2023 Prepared by: Waddell Southgate  Exercises - Supine Elbow Flexion Extension with Dumbbell  - 1 x daily - 7 x weekly - 3 sets - 10 reps - Seated Single Arm Elbow Extension Push-up on Table  - 1 x daily - 7 x weekly - 3 sets - 10 reps - Wheelchair Push-Up (AKA)  - 1 x daily - 7 x weekly - 3 sets - 5 reps - Seated Biceps Curl  - 1 x daily - 7 x weekly - 3 sets - 10 reps - Forearm Pronation and Supination with Hammer  - 1 x daily - 7 x weekly - 3 sets - 10 reps - Seated Wrist Extension with Dumbbell  - 1 x daily - 7 x weekly - 3 sets - 10 reps - Supine Bridge  - 1 x daily - 7 x weekly - 3 sets - 10 reps - Supine Heel Slide  - 1 x daily - 7 x weekly - 3 sets - 10 reps  - Seated Scapular Retraction  - 1 x daily - 7 x weekly - 3  sets - 10 reps - 5 sec hold - Seated Shoulder Flexion Full Range  - 1 x daily - 7 x weekly - 3 sets - 10 reps - Seated Shoulder Scaption AROM  - 1 x daily - 7 x weekly - 3 sets - 10 reps - Seated Shoulder Abduction  - 1 x daily - 7 x weekly - 3 sets - 10 reps  GOALS (at 7/9 re-cert): Goals reviewed with patient? Yes  SHORT TERM GOALS:   Target date:  01/20/2024  Pt will perform rolling left and right from supine at no more than minA in order to demonstrate progression back to PLOF. Baseline: mod-maxA over recent 2 weeks (7/9); minA (8/12) Goal status: MET  2.  Pt will be able to reach outside of short sitting BOS and recover at no more than mod I level to demonstrate functional core strengthening. Baseline: min-modA (7/9), min A (8/7) Goal status: IN PROGRESS  LONG TERM GOALS:  Target date: 02/17/2024  Pt will perform sit to stand transfer with LRAD with min A Baseline: max A to stedy (4/4), max A in // bars (4/25), max A in // bars (5/22), max A to +2 in // bars (6/18); modA (7/9); maxA // bars (9/17) Goal status: NOT MET  2.  Pt to tolerate standing x 6 min using upright posture with LRAD to demonstrate improved endurance. Baseline: 30 sec in stedy (initial), 2:35 min in // bars (4/25), 3 min in // bars (5/22), 4 min in // bars (6/18); 5 minutes 4 seconds in // bars (7/9); unable (9/17) Goal status: NOT MET  3.  Pt will be able to obtain static long sitting position with no more than minA in order to promote improved functional positioning at home. Baseline: modA for trunk and LE support into forward positioning (7/9); modA (9/17) Goal status: NOT MET  GOALS (at 0/82 re-cert): Goals reviewed with patient? Yes  SHORT TERM GOALS:   Target date: 03/30/2024  Pt will be independent and compliant with advanced and finalized PROM/stretching, strengthening, and standing based HEP in order to maintain functional progress and improve mobility. Baseline: Established, pt intermittently compliant based on appt schedule (9/17), met (10/14) Goal status: MET  LONG TERM GOALS:  Target date: 04/27/2024***  Pt will perform sit to stand transfer with LRAD with min A Baseline: max A to stedy (4/4), max A in // bars (4/25), max A in // bars (5/22), max A to +2 in // bars (6/18); modA (7/9); maxA // bars (9/17) Goal status: ONGOING  2.  Pt to  tolerate standing x 1 min using upright posture with LRAD at no more than modA to demonstrate improved endurance and return to functional baseline. Baseline: 30 sec in stedy (initial), 2:35 min in // bars (4/25), 3 min in // bars (5/22), 4 min in // bars (6/18); 5 minutes 4 seconds in // bars (7/9); unable (9/17) Goal status: REVISED  ASSESSMENT:  CLINICAL IMPRESSION: Emphasis of skilled PT session on*** assessing BLE edema as compared to initial assessment, working on short-sitting balance, discussing pros/cones of external bracing for posture as well as postural strengthening exercises. Pt exhibits decreased BLE edema at knees and ankles as compared to initial assessment. She continues to exhibit impaired core strength/control consistent with her SCI level. Plan to provide handout for postural stabilization exercises next visit. Also plan to assess LTG and d/c from OPPT services next visit.    OBJECTIVE IMPAIRMENTS: decreased balance, decreased endurance, decreased mobility, difficulty walking, decreased  ROM, decreased strength, increased edema, impaired perceived functional ability, increased muscle spasms, impaired flexibility, impaired sensation, impaired tone, impaired UE functional use, postural dysfunction, and pain.   ACTIVITY LIMITATIONS: carrying, lifting, bending, standing, stairs, transfers, bed mobility, continence, bathing, toileting, dressing, reach over head, and hygiene/grooming  PARTICIPATION LIMITATIONS: meal prep, cleaning, laundry, driving, shopping, community activity, and occupation  PERSONAL FACTORS: Age, Sex, Time since onset of injury/illness/exacerbation, and 1-2 comorbidities:   C7 ASIA C- incomplete quad w/ neurogenic bladder and bowel, HLD, Hx of skin cancerare also affecting patient's functional outcome.   REHAB POTENTIAL: Good  CLINICAL DECISION MAKING: Stable/uncomplicated  EVALUATION COMPLEXITY: High  PLAN:  PT FREQUENCY: 2x/week + 1x/wk  PT DURATION: 8  weeks + 8 wks + 8 wks  PLANNED INTERVENTIONS: 97164- PT Re-evaluation, 97750- Physical Performance Testing, 97110-Therapeutic exercises, 97530- Therapeutic activity, [id number]- Neuromuscular re-education, 97535- Self Care, [id number]- Manual therapy, [id number]- Gait training, [phone]- Orthotic Initial, [phone]- Electrical stimulation (manual), [phone] (1-2 muscles), [id number] (3+ muscles)- Dry Needling, Patient/Family education, Balance training, Stair training, Taping, Joint mobilization, Scar mobilization, Compression bandaging, DME instructions, Wheelchair mobility training, Cryotherapy, and Moist heat  PLAN FOR NEXT SESSION: print out postural stabilization exercises, assess LTG and d/c***  Waddell Southgate, PT Waddell Southgate, PT, DPT, CSRS       04/17/2024, 12:31 PM

## 2024-04-18 DIAGNOSIS — R338 Other retention of urine: Secondary | ICD-10-CM | POA: Diagnosis not present

## 2024-04-18 DIAGNOSIS — N319 Neuromuscular dysfunction of bladder, unspecified: Secondary | ICD-10-CM | POA: Diagnosis not present

## 2024-04-18 DIAGNOSIS — G8254 Quadriplegia, C5-C7 incomplete: Secondary | ICD-10-CM | POA: Diagnosis not present

## 2024-04-20 ENCOUNTER — Ambulatory Visit (INDEPENDENT_AMBULATORY_CARE_PROVIDER_SITE_OTHER)

## 2024-04-20 DIAGNOSIS — Z23 Encounter for immunization: Secondary | ICD-10-CM | POA: Diagnosis not present

## 2024-04-23 ENCOUNTER — Telehealth: Payer: Self-pay

## 2024-04-23 ENCOUNTER — Other Ambulatory Visit

## 2024-04-23 ENCOUNTER — Encounter: Payer: Self-pay | Admitting: Physical Medicine and Rehabilitation

## 2024-04-23 NOTE — Telephone Encounter (Signed)
 Copied from CRM [id number]. Topic: Clinical - Medical Advice >> Apr 23, 2024 10:37 AM Terri MATSU wrote: Reason for CRM: Patient is calling regarding her documents that she uploaded to the portal last week that she needs Dr.Panosh to sign ASAP. SABRA Callback number [phone]

## 2024-04-23 NOTE — Telephone Encounter (Addendum)
 The forms has been printed out and rachel will give to dr charlett tomorrow . Pt husband is aware we can charge a fee for the forms

## 2024-04-24 NOTE — Telephone Encounter (Signed)
 Contacted pt. Pt states she will pick up the form tomorrow. Pt is aware form is ready at the front desk to be pick up.

## 2024-04-24 NOTE — Telephone Encounter (Signed)
 On your desk

## 2024-04-24 NOTE — Telephone Encounter (Signed)
 Form was completed by provider and pt was contacted. Please see mychart message for more information.

## 2024-05-02 ENCOUNTER — Ambulatory Visit: Admitting: Speech Pathology

## 2024-05-02 DIAGNOSIS — R293 Abnormal posture: Secondary | ICD-10-CM | POA: Diagnosis not present

## 2024-05-02 DIAGNOSIS — R29898 Other symptoms and signs involving the musculoskeletal system: Secondary | ICD-10-CM | POA: Diagnosis not present

## 2024-05-02 DIAGNOSIS — R498 Other voice and resonance disorders: Secondary | ICD-10-CM | POA: Diagnosis not present

## 2024-05-02 DIAGNOSIS — M6281 Muscle weakness (generalized): Secondary | ICD-10-CM | POA: Diagnosis not present

## 2024-05-02 DIAGNOSIS — G8254 Quadriplegia, C5-C7 incomplete: Secondary | ICD-10-CM | POA: Diagnosis not present

## 2024-05-02 DIAGNOSIS — R29818 Other symptoms and signs involving the nervous system: Secondary | ICD-10-CM | POA: Diagnosis not present

## 2024-05-02 NOTE — Patient Instructions (Signed)
    Your maximum expiratory pressure today was 31 cm/H20, the average for women your age is 71.92  Your maximum inspiratory pressure today was 36 cm/H20, the average for women your age is 29.08  For exhale exercise, the device should be set between 20-25 (it's hard to tell, just do the best you can )  For inhale exercise, the device should be set on 30ish,   You have been issued a Respiratory Muscle Strength Trainer(s) to increase lung strength for improved speech and/or swallowing abilities. Please follow the instructions below provided by your Speech-Language Pathologist to complete your exercises.  Expiratory Muscle Training  Green device   Perform  5 sets of 5 repetitions, 5 times per day   How to perform: Take a big breath, then blow hard into the trainer (device) Rest for at least 2 seconds between repetitions  What you should feel/hear: Burst of air through the device  Inspiratory Muscle Training Blue Adapter - "Suck in"  Perform 5 sets of 5 repetitions, 5 times per day (total of 75 a day)   How to perform: Place trainer in your mouth and then suck into the trainer (device) Rest for at least 2 seconds between repetitions  What you should feel/hear: Burst of air through the device  Take a break or discontinue use if you experience lightheadedness, dizziness, pain, shortness of breath, color change, or excessive sweating. Any questions, call [phone]. Please bring your device(s) to therapy sessions.  [place] Exercise - half swallow - start a swallow, hold your swallow when your larynx is at the top, for 5 seconds 15x (sets of 5)

## 2024-05-07 NOTE — Therapy (Signed)
 OUTPATIENT SPEECH LANGUAGE PATHOLOGY VOICE TREATMENT   Patient Name: [name] MRN: [id number] DOB:May 31, 1951, 73 y.o., female Today's Date: 05/07/2024  PCP: [name], MD REFERRING PROVIDER: [name], MD  END OF SESSION:  End of Session - 05/07/24 0840     Visit Number 2    Number of Visits 13    Date for Recertification  06/12/24    Authorization Type [employer]    SLP Start Time 1445    SLP Stop Time  1530    SLP Time Calculation (min) 45 min    Activity Tolerance Patient tolerated treatment well           Past Medical History:  Diagnosis Date   Arthritis 2019   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 Aneita   Fibroid 2004   Per Dr. Lenon   History of shingles    face and mouth   Hx of skin cancer, basal cell    Hyperlipidemia 2022   lower legs and feet   Rosacea    Sciatica of left side 09/28/2013   Scoliosis    noted on mri done for back pain   Past Surgical History:  Procedure Laterality Date   BUNIONECTOMY     SPINE SURGERY  07/28/20   fused C5-C6   Patient Active Problem List   Diagnosis Date Noted   Hematuria 12/28/2023   Medication management 12/26/2023   Buttock wound, left, subsequent encounter 03/16/2023   Bronchiectasis with acute exacerbation (HCC) 03/15/2023   Buttock wound, left, initial encounter 11/02/2022   Orthostatic hypotension 08/13/2022   Neurogenic bowel 05/03/2022   Spasticity 05/03/2022   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/03/2022   Nerve pain 05/03/2022   Medication monitoring encounter 01/08/2022   Neurogenic bladder 10/11/2021   Urinary incontinence 10/11/2021   ESBL (extended spectrum beta-lactamase) producing bacteria infection 10/09/2021   Recurrent UTI 10/09/2021   Quadriplegia, C5-C7 incomplete (HCC) 01/16/2021   History of spinal fracture 01/16/2021   Suprapubic catheter (HCC) 01/16/2021   Encounter for routine  gynecological examination 09/28/2013   Onychomycosis 09/28/2013   Foot deformity, acquired 03/26/2012   Encounter for preventive health examination 12/25/2010   ROSACEA 08/25/2009   Disturbance in sleep behavior 03/11/2008   SKIN CANCER, HX OF 03/11/2008   DYSURIA, HX OF 03/11/2008   Hyperlipidemia 02/10/2007   CERVICALGIA 02/10/2007    Onset date: 12/13/2023 (referral date)  REFERRING DIAG: G82.50 (ICD-10-CM) - Quadriplegia (HCC) R47.89 (ICD-10-CM) - Quality of voice, breathiness R13.10 (ICD-10-CM) - Dysphagia, unspecified type  THERAPY DIAG:  Other voice and resonance disorders  Rationale for Evaluation and Treatment: Rehabilitation  SUBJECTIVE:   SUBJECTIVE STATEMENT: I have stopped doing presentations as my voice is not strong Pt accompanied by: Live in caregiver, [name]  PERTINENT HISTORY: C7 ASIA C- incomplete quad w/ neurogenic bladder and bowel, HLD, Hx of skin cancer. Pt is a 73 yr old L handed female with hx of incomplete quadriplegia- 2/14 2022- fleeing the police in [place] on passenger 100 (high speed) miles/hour,  [name]; neurogenic bowel and bladder and spasticity; [name], [name] Here for f/u on Incomplete quadriplegia   B femur fractures December, 2024.  PAIN:  Are you having pain? Yes, chronic, generalized   PATIENT GOALS:To have a stronger voice and improved swallow    SOCIAL HISTORY: Occupation: retired, research officer, trade union, gives presentations until recently  Water intake: optimal Caffeine/alcohol intake: minimal Daily voice use: minimal   PATIENT REPORTED OUTCOME MEASURES (PROM): V-RQOL: 22 - She rated a 4 or a lot of a problem having trouble doing her job due to voice; a 3 or a medium amount of problem speaking loudly, running out of air when talking, feeling anxious or frustrated due to voice. A 2 or a little problem feeling depressed due to voice, having to repeat to be understood, and being less outgoing due to voice.                                                                                                                             TREATMENT DATE:   05/02/24:  Swallow:  results and recommendations of MBSS - initiated training in HEP for pharyngeal dysphagia. Mendelson exercise completed 8x with occasional min modeling and verbal cues after initial instructions. With rare min questioning cues, Olga verbalized swallow precautions. Measure Maximum Inspiratory Pressure (MIP) and Maximum Expiratory Pressure (MEP) to target voice and dysphagia - MIP was 36cm/H2O (average for her age and gender is 13.08) MEP was 28 cm/H2O (averaged for age and gender is 61.92). Initiated training in RMST with device set at 25cm H2) for EMST and 30 for IMST - these are approximately 75% of MIP and MEP values. With usual mod A, Nahomi demonstrated 5 sets of 5 reps of EMST and IMST. HEP is to complete 5 sets of 5 reps 3x a day. Speech: HEP for voice completed after modeling and instruction - she has been completing this inconsistently since evaluation. Mod A required for glides and modeling required for high pitch and low pitch sentences.   03/20/24: Voice evaluation completed. As pt stimulable for clear phonation and volume with increased vocal intensity, initiated training of high intensity voice exercises for HEP - with usual min modeling and verbal cues, Jozy completed HEP including sustained vowel, pitch glides, oral reading in high and low pitch and reading with increased vocal intensity. No evidence of strain. Clear phonation maintained in all exercises. Initiated training on throat clear alternatives and identifying when she is speaking on residual air with usual mod A. Instructed her to try chewable Vit C for ease of swallowing.   PATIENT EDUCATION: Education details: HEP for voice, general swallow precautions, vocal hygiene Person educated: Patient and Arts Administrator Education method: Explanation, Demonstration, Verbal  cues, and Handouts Education comprehension: verbal cues required and needs further education  HOME EXERCISE PROGRAM: High intensity voice exercises, PhoRTE  GOALS: Goals reviewed with patient? Yes  SHORT TERM GOALS: Target date: 05/16/24  Pt will complete HEP for dysphonia with mod I twice daily Baseline: Goal status: INITIAL  2.  Pt will average 74dB 18/20 sentences Baseline:  Goal status: INITIAL  3.  Pt will follow diet modifications and swallow precautions pending MBSS with mod I Baseline:  Goal status: INITIAL  4.  Pt will complete RMST at 75% of MIP and MEP 75 reps  daily with rare min A Baseline:  Goal status: INITIAL  5.  Pt will ID and correct when speaking on residual air Baseline:  Goal status: INITIAL  6.  Pt will use throat clear alternatives 4/5 opportunities with occasional min A Baseline:  Goal status: INITIAL  LONG TERM GOALS: Target date: 06/12/24  Pt will complete HEP for dysphagia with mod I - pending MBSS Baseline:  Goal status: INITIAL  2.  Pt will maintain clear phonation and average 72dB over 15 minute conversation Baseline:  Goal status: INITIAL  3.  Pt will self correct speaking on residual volume with mod I Baseline:  Goal status: INITIAL  4.  Pt will improve score on VRQOL Baseline:  Goal status: INITIAL  ASSESSMENT:  CLINICAL IMPRESSION: Patient is a 73 y.o. female who was seen today for dysphonia and dysphagia. She reports ongoing hoarseness, running out of air when speaking and difficulty swallowing dense protein and larger pills. She is taking larger Vit C pills with applesauce, however they do get stuck - cutting them made raw edges which irritated her throat. She reports dysphagia since anterior neck surgery after he accident. She and her caregiver report dysphagia is worsening. Lodema does endorse phlegm after eating and after taking meds, which she clears by using multiple throat clears. She denies h/o MBSS. She was on  pepcid for reflux which she stated she used to have however she has stopped taking this. Her voice is hoarse, with episodes of minimal aphonia when air runs out. She is noted to clear her throat repeatedly and speak on residual air, increasing vocal fry. 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 She failed [employer] screen. I recommend MBSS and skilled ST to maximize intelligibility and safety of swallow for safety, independence, to reduce caregiver burden as well as improve QOL/social participation. Consider referral to laryngologist to r/o vocal fold pathology due to progressive dysphonia and dysphagia  OBJECTIVE IMPAIRMENTS: include expressive language, voice disorder, and dysphagia. These impairments are limiting patient from effectively communicating at home and in community and safety when swallowing. Factors affecting potential to achieve goals and functional outcome are co-morbidities.. Patient will benefit from skilled SLP services to address above impairments and improve overall function.  REHAB POTENTIAL: Good  PLAN:  SLP FREQUENCY: 1-2x/week  SLP DURATION: 12 weeks  PLANNED INTERVENTIONS: Aspiration precaution training, Pharyngeal strengthening exercises, Diet toleration management , Language facilitation, Environmental controls, Trials of upgraded texture/liquids, Internal/external aids, Functional tasks, SLP instruction and feedback, Compensatory strategies, Patient/family education, [phone] Treatment of speech (30 or 45 min) , and [id number]- Speech Eval Behavioral Qualitative Voice Resonance, MBSS    Zoriyah Scheidegger, Leita Caldron, CCC-SLP 05/07/2024, 8:48 AM

## 2024-05-08 ENCOUNTER — Ambulatory Visit

## 2024-05-08 DIAGNOSIS — R29898 Other symptoms and signs involving the musculoskeletal system: Secondary | ICD-10-CM | POA: Diagnosis not present

## 2024-05-08 DIAGNOSIS — G8254 Quadriplegia, C5-C7 incomplete: Secondary | ICD-10-CM | POA: Diagnosis not present

## 2024-05-08 DIAGNOSIS — R293 Abnormal posture: Secondary | ICD-10-CM | POA: Diagnosis not present

## 2024-05-08 DIAGNOSIS — R498 Other voice and resonance disorders: Secondary | ICD-10-CM | POA: Diagnosis not present

## 2024-05-08 DIAGNOSIS — M6281 Muscle weakness (generalized): Secondary | ICD-10-CM | POA: Diagnosis not present

## 2024-05-08 DIAGNOSIS — R29818 Other symptoms and signs involving the nervous system: Secondary | ICD-10-CM | POA: Diagnosis not present

## 2024-05-08 NOTE — Therapy (Signed)
 OUTPATIENT SPEECH LANGUAGE PATHOLOGY VOICE TREATMENT   Patient Name: [name] MRN: [id number] DOB:[birth year], 72 y.o., female Today's Date: 05/08/2024  PCP: [name], MD REFERRING PROVIDER: [name], MD  END OF SESSION:  End of Session - 05/08/24 1403     Visit Number 3    Number of Visits 13    Date for Recertification  06/12/24    SLP Start Time 1316    SLP Stop Time  1403    SLP Time Calculation (min) 47 min    Activity Tolerance Patient tolerated treatment well            Past Medical History:  Diagnosis Date   Arthritis 2019   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 Aneita   Fibroid 2004   Per Dr. Lenon   History of shingles    face and mouth   Hx of skin cancer, basal cell    Hyperlipidemia 2022   lower legs and feet   Rosacea    Sciatica of left side 09/28/2013   Scoliosis    noted on mri done for back pain   Past Surgical History:  Procedure Laterality Date   BUNIONECTOMY     SPINE SURGERY  07/28/20   fused C5-C6   Patient Active Problem List   Diagnosis Date Noted   Hematuria 12/28/2023   Medication management 12/26/2023   Buttock wound, left, subsequent encounter 03/16/2023   Bronchiectasis with acute exacerbation (HCC) 03/15/2023   Buttock wound, left, initial encounter 11/02/2022   Orthostatic hypotension 08/13/2022   Neurogenic bowel 05/03/2022   Spasticity 05/03/2022   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/03/2022   Nerve pain 05/03/2022   Medication monitoring encounter 01/08/2022   Neurogenic bladder 10/11/2021   Urinary incontinence 10/11/2021   ESBL (extended spectrum beta-lactamase) producing bacteria infection 10/09/2021   Recurrent UTI 10/09/2021   Quadriplegia, C5-C7 incomplete (HCC) 01/16/2021   History of spinal fracture 01/16/2021   Suprapubic catheter (HCC) 01/16/2021   Encounter for routine gynecological examination  09/28/2013   Onychomycosis 09/28/2013   Foot deformity, acquired 03/26/2012   Encounter for preventive health examination 12/25/2010   ROSACEA 08/25/2009   Disturbance in sleep behavior 03/11/2008   SKIN CANCER, HX OF 03/11/2008   DYSURIA, HX OF 03/11/2008   Hyperlipidemia 02/10/2007   CERVICALGIA 02/10/2007    Onset date: 12/13/2023 (referral date)  REFERRING DIAG: G82.50 (ICD-10-CM) - Quadriplegia (HCC) R47.89 (ICD-10-CM) - Quality of voice, breathiness R13.10 (ICD-10-CM) - Dysphagia, unspecified type  THERAPY DIAG:  Other voice and resonance disorders  Rationale for Evaluation and Treatment: Rehabilitation  SUBJECTIVE:   SUBJECTIVE STATEMENT: It sounds to me like I don't have enough volume and that it is gravely.  Pt accompanied by: Live in caregiver, [name]  PERTINENT HISTORY: C7 ASIA C- incomplete quad w/ neurogenic bladder and bowel, HLD, Hx of skin cancer. Pt is a 73 yr old L handed female with hx of incomplete quadriplegia- 2/14 2022- fleeing the police in [place] on passenger 100 (high speed) miles/hour,  [name]; neurogenic bowel and bladder and spasticity; [name], [name] Here for f/u on Incomplete quadriplegia   B femur fractures December, 2024.  PAIN:  Are you having pain? Yes, chronic, generalized   PATIENT GOALS:To have a stronger voice and improved swallow    SOCIAL HISTORY: Occupation: retired, research officer, trade union, gives presentations until recently  Water intake: optimal Caffeine/alcohol intake: minimal Daily voice use: minimal   PATIENT REPORTED OUTCOME MEASURES (PROM): V-RQOL: 22 - She rated a 4 or a lot of a problem having trouble doing her job due to voice; a 3 or a medium amount of problem speaking loudly, running out of air when talking, feeling anxious or frustrated due to voice. A 2 or a little problem feeling depressed due to voice, having to repeat to be understood, and being less outgoing due to voice.                                                                                                                             TREATMENT DATE:   05/08/24: EMST/IMST/Swallowing: [name] was present for session. Pt had questions regarding use of EMST/IMST device. SLP guided pt and [name] through how to assemble EMST/IMST device and reviewed instructions for utilizing the device for pt. Pt performed EMST with 100% consistency at 28 cmH20 for 30 reps given rare min A. Pt performed IMST for 5 reps at 28 cmH20 with 90% consistency with rare min A. SLP encouraged pt to continue HEP program for EMST/IMST to optimize expiratory/inspiratory muscle strength for voice and swallowing. Pt and [name] were curious about recent MBSS results, especially wanting to know if results were serious. SLP educated pt about MBSS results. Pt was also curious about reason for performing hard swallow in the place of a cough. SLP instructed pt about cough suppression strategies vs. Cough strategies for protecting upper airway during swallowing. Plan is to continue EMST/IMST in upcoming session and target pharyngeal strengthening exercises for promoting swallow function.   Voice: SLP educated pt about physiology of voice (respiration, phonation, artic/resonance). SLP introduced Resonant Voice Therapy (RVT) to optimize pt vocal quality and volume. During production of RVT /b, v, z, k/ syllables and words, pt achieved clear vocal quality in 90% of opportunities. Tongue and lip trills for semi-occluded vocal tract exercises were attempted with no apparent success for pt. SLP promoted attention to self-awareness of voice during structured exercises compared to voice prior to RVT to optimize pt awareness of voice. Pt demonstrates emerging awareness of voice quality pre vs. Post structured exercises. SLP provided HEP involving stimuli for targeted sounds during session for increasing forward resonance. Plan is to target sustained phonation /ah/ for increasing vocal volume  and continuing RVT for optimizing balanced phonation.   05/02/24:  Swallow:  results and recommendations of MBSS - initiated training in HEP for pharyngeal dysphagia. Mendelson exercise completed 8x with occasional min modeling and verbal cues after initial instructions. With rare min questioning cues, [name] verbalized swallow precautions. Measure Maximum Inspiratory Pressure (MIP) and Maximum Expiratory Pressure (MEP) to target voice and dysphagia - MIP was 36cm/H2O (average for her age and gender is 92.08) MEP was 68 cm/H2O (averaged for age and gender is 61.92). Initiated training in RMST with device set at 25cm H2) for EMST and 30 for IMST - these are approximately  75% of MIP and MEP values. With usual mod A, [name] demonstrated 5 sets of 5 reps of EMST and IMST. HEP is to complete 5 sets of 5 reps 3x a day. Speech: HEP for voice completed after modeling and instruction - she has been completing this inconsistently since evaluation. Mod A required for glides and modeling required for high pitch and low pitch sentences.   03/20/24: Voice evaluation completed. As pt stimulable for clear phonation and volume with increased vocal intensity, initiated training of high intensity voice exercises for HEP - with usual min modeling and verbal cues, Jacqualyn completed HEP including sustained vowel, pitch glides, oral reading in high and low pitch and reading with increased vocal intensity. No evidence of strain. Clear phonation maintained in all exercises. Initiated training on throat clear alternatives and identifying when she is speaking on residual air with usual mod A. Instructed her to try chewable Vit C for ease of swallowing.   PATIENT EDUCATION: Education details: HEP for voice, general swallow precautions, vocal hygiene Person educated: Patient and Arts Administrator Education method: Explanation, Demonstration, Verbal cues, and Handouts Education comprehension: verbal cues required and needs further  education  HOME EXERCISE PROGRAM: High intensity voice exercises, PhoRTE  GOALS: Goals reviewed with patient? Yes  SHORT TERM GOALS: Target date: 05/16/24  Pt will complete HEP for dysphonia with mod I twice daily Baseline: Goal status: ONGOING  2.  Pt will average 74dB 18/20 sentences Baseline:  Goal status: ONGOING  3.  Pt will follow diet modifications and swallow precautions pending MBSS with mod I Baseline:  Goal status: ONGOING  4.  Pt will complete RMST at 75% of MIP and MEP 75 reps daily with rare min A Baseline:  Goal status: ONGOING  5.  Pt will ID and correct when speaking on residual air Baseline:  Goal status: ONGOING  6.  Pt will use throat clear alternatives 4/5 opportunities with occasional min A Baseline:  Goal status: ONGOING  LONG TERM GOALS: Target date: 06/12/24  Pt will complete HEP for dysphagia with mod I - pending MBSS Baseline:  Goal status: ONGOING  2.  Pt will maintain clear phonation and average 72dB over 15 minute conversation Baseline:  Goal status: ONGOING  3.  Pt will self correct speaking on residual volume with mod I Baseline:  Goal status: ONGOING  4.  Pt will improve score on VRQOL Baseline:  Goal status: ONGOING  ASSESSMENT:  CLINICAL IMPRESSION: Patient is a 73 y.o. female who was seen today for dysphonia and dysphagia. She reports ongoing hoarseness, running out of air when speaking and difficulty swallowing dense protein and larger pills. She is taking larger Vit C pills with applesauce, however they do get stuck - cutting them made raw edges which irritated her throat. She reports dysphagia since anterior neck surgery after he accident. She and her caregiver report dysphagia is worsening. [name] does endorse phlegm after eating and after taking meds, which she clears by using multiple throat clears. She denies h/o MBSS. She was on pepcid for reflux which she stated she used to have however she has stopped taking  this. Her voice is hoarse, with episodes of minimal aphonia when air runs out. She is noted to clear her throat repeatedly and speak on residual air, increasing vocal fry. 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 She failed [employer] screen. I recommend MBSS and skilled ST to maximize intelligibility and safety of swallow for safety,  independence, to reduce caregiver burden as well as improve QOL/social participation. Consider referral to laryngologist to r/o vocal fold pathology due to progressive dysphonia and dysphagia  OBJECTIVE IMPAIRMENTS: include expressive language, voice disorder, and dysphagia. These impairments are limiting patient from effectively communicating at home and in community and safety when swallowing. Factors affecting potential to achieve goals and functional outcome are co-morbidities.. Patient will benefit from skilled SLP services to address above impairments and improve overall function.  REHAB POTENTIAL: Good  PLAN:  SLP FREQUENCY: 1-2x/week  SLP DURATION: 12 weeks  PLANNED INTERVENTIONS: Aspiration precaution training, Pharyngeal strengthening exercises, Diet toleration management , Language facilitation, Environmental controls, Trials of upgraded texture/liquids, Internal/external aids, Functional tasks, SLP instruction and feedback, Compensatory strategies, Patient/family education, [phone] Treatment of speech (30 or 45 min) , and [id number]- Speech Eval Behavioral Qualitative Voice Resonance, MBSS    W[employer]ell [employer], CF-SLP 05/08/2024, 3:20 PM

## 2024-05-09 ENCOUNTER — Ambulatory Visit

## 2024-05-09 DIAGNOSIS — R29898 Other symptoms and signs involving the musculoskeletal system: Secondary | ICD-10-CM | POA: Diagnosis not present

## 2024-05-09 DIAGNOSIS — G8254 Quadriplegia, C5-C7 incomplete: Secondary | ICD-10-CM | POA: Diagnosis not present

## 2024-05-09 DIAGNOSIS — R29818 Other symptoms and signs involving the nervous system: Secondary | ICD-10-CM | POA: Diagnosis not present

## 2024-05-09 DIAGNOSIS — R293 Abnormal posture: Secondary | ICD-10-CM | POA: Diagnosis not present

## 2024-05-09 DIAGNOSIS — M6281 Muscle weakness (generalized): Secondary | ICD-10-CM | POA: Diagnosis not present

## 2024-05-09 DIAGNOSIS — R498 Other voice and resonance disorders: Secondary | ICD-10-CM

## 2024-05-09 NOTE — Therapy (Signed)
 OUTPATIENT SPEECH LANGUAGE PATHOLOGY VOICE TREATMENT   Patient Name: [name] MRN: [id number] DOB:21-Mar-1951, 73 y.o., female Today's Date: 05/09/2024  PCP: [name], MD REFERRING PROVIDER: [name], MD  END OF SESSION:  End of Session - 05/09/24 1317     Visit Number 4    Number of Visits 13    Date for Recertification  06/12/24    SLP Start Time 1231    SLP Stop Time  1318    SLP Time Calculation (min) 47 min    Activity Tolerance Patient tolerated treatment well             Past Medical History:  Diagnosis Date   Arthritis 2019   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 Aneita   Fibroid 2004   Per Dr. Lenon   History of shingles    face and mouth   Hx of skin cancer, basal cell    Hyperlipidemia 2022   lower legs and feet   Rosacea    Sciatica of left side 09/28/2013   Scoliosis    noted on mri done for back pain   Past Surgical History:  Procedure Laterality Date   BUNIONECTOMY     SPINE SURGERY  07/28/20   fused C5-C6   Patient Active Problem List   Diagnosis Date Noted   Hematuria 12/28/2023   Medication management 12/26/2023   Buttock wound, left, subsequent encounter 03/16/2023   Bronchiectasis with acute exacerbation (HCC) 03/15/2023   Buttock wound, left, initial encounter 11/02/2022   Orthostatic hypotension 08/13/2022   Neurogenic bowel 05/03/2022   Spasticity 05/03/2022   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/03/2022   Nerve pain 05/03/2022   Medication monitoring encounter 01/08/2022   Neurogenic bladder 10/11/2021   Urinary incontinence 10/11/2021   ESBL (extended spectrum beta-lactamase) producing bacteria infection 10/09/2021   Recurrent UTI 10/09/2021   Quadriplegia, C5-C7 incomplete (HCC) 01/16/2021   History of spinal fracture 01/16/2021   Suprapubic catheter (HCC) 01/16/2021   Encounter for routine gynecological examination  09/28/2013   Onychomycosis 09/28/2013   Foot deformity, acquired 03/26/2012   Encounter for preventive health examination 12/25/2010   ROSACEA 08/25/2009   Disturbance in sleep behavior 03/11/2008   SKIN CANCER, HX OF 03/11/2008   DYSURIA, HX OF 03/11/2008   Hyperlipidemia 02/10/2007   CERVICALGIA 02/10/2007    Onset date: 12/13/2023 (referral date)  REFERRING DIAG: G82.50 (ICD-10-CM) - Quadriplegia (HCC) R47.89 (ICD-10-CM) - Quality of voice, breathiness R13.10 (ICD-10-CM) - Dysphagia, unspecified type  THERAPY DIAG:  Other voice and resonance disorders  Rationale for Evaluation and Treatment: Rehabilitation  SUBJECTIVE:   SUBJECTIVE STATEMENT: It sounds to me like I don't have enough volume and that it is gravely.  Pt accompanied by: Live in caregiver, [name]  PERTINENT HISTORY: C7 ASIA C- incomplete quad w/ neurogenic bladder and bowel, HLD, Hx of skin cancer. Pt is a 73 yr old L handed female with hx of incomplete quadriplegia- 2/14 2022- fleeing the police in [place] on passenger 100 (high speed) miles/hour,  [name]; neurogenic bowel and bladder and spasticity; [name], [name] Here for f/u on Incomplete quadriplegia   B femur fractures December, 2024.  PAIN:  Are you having pain? Yes, chronic, generalized   PATIENT GOALS:To have a stronger voice and improved swallow    SOCIAL HISTORY: Occupation: retired, research officer, trade union, gives presentations until  recently Water intake: optimal Caffeine/alcohol intake: minimal Daily voice use: minimal   PATIENT REPORTED OUTCOME MEASURES (PROM): V-RQOL: 22 - She rated a 4 or a lot of a problem having trouble doing her job due to voice; a 3 or a medium amount of problem speaking loudly, running out of air when talking, feeling anxious or frustrated due to voice. A 2 or a little problem feeling depressed due to voice, having to repeat to be understood, and being less outgoing due to voice.                                                                                                                             TREATMENT DATE:   05/09/24: [name] was present for session. Pt wants to try to present at a couple of conferences in the future. Pt gets frustrated when people finish her sentence during conversation due to voice difficulties. SLP educated pt about self-advocacy before and during conversation to inform conversation partners about needing time to optimize voice. SLP reviewed physiology of voice (respiration, phonation, artic/resonance). SLP reviewed Resonant Voice Therapy (RVT) to increase and engage forward resonance for balanced phonation. During production of RVT /b, p, z/ words and phrases, pt achieved clear vocal quality in 90% of opportunities given frequent min A. SLP promoted pt self-awareness of dysphonic vocal quality to optimize use of voice strategies for generalization to conversation. Pt increasingly demonstrates awareness of voice quality pre vs. post structured exercises. SLP introduced conversation therapy training (CTT) to minimize laryngeal hyperfunction during conversation. Pt utilized forward resonance and diaphragmatic breath support during conversation 60% of the time given frequent min A. Pt's awareness during conversation improved throughout session. By end of session, pt was highly engaged in using voice strategies for achieving clear vocal quality. SLP provided pt HEP focused on sentence readings and continued RVT training with more sounds /m, d/. Plan is to review IMST/EMST in upcoming session and to address swallow needs. Voice therapy will continue in upcoming session as well.   05/08/24: EMST/IMST/Swallowing: [name] was present for session. Pt had questions regarding use of EMST/IMST device. SLP guided pt and [name] through how to assemble EMST/IMST device and reviewed instructions for utilizing the device for pt. Pt performed EMST with 100% consistency at 28 cmH20 for 30 reps  given rare min A. Pt performed IMST for 5 reps at 28 cmH20 with 90% consistency with rare min A. SLP encouraged pt to continue HEP program for EMST/IMST to optimize expiratory/inspiratory muscle strength for voice and swallowing. Pt and [name] were curious about recent MBSS results, especially wanting to know if results were serious. SLP educated pt about MBSS results. Pt was also curious about reason for performing hard swallow in the place of a cough. SLP instructed pt about cough suppression strategies vs. Cough strategies for protecting upper airway during swallowing. Plan is to continue EMST/IMST in upcoming session and target pharyngeal strengthening exercises for promoting swallow function.   Voice: SLP  educated pt about physiology of voice (respiration, phonation, artic/resonance). SLP introduced Resonant Voice Therapy (RVT) to optimize pt vocal quality and volume. During production of RVT /b, v, z, k/ syllables and words, pt achieved clear vocal quality in 90% of opportunities. Tongue and lip trills for semi-occluded vocal tract exercises were attempted with no apparent success for pt. SLP promoted attention to self-awareness of voice during structured exercises compared to voice prior to RVT to optimize pt awareness of voice. Pt demonstrates emerging awareness of voice quality pre vs. Post structured exercises. SLP provided HEP involving stimuli for targeted sounds during session for increasing forward resonance. Plan is to target sustained phonation /ah/ for increasing vocal volume and continuing RVT for optimizing balanced phonation.   05/02/24:  Swallow:  results and recommendations of MBSS - initiated training in HEP for pharyngeal dysphagia. Mendelson exercise completed 8x with occasional min modeling and verbal cues after initial instructions. With rare min questioning cues, Kenyette verbalized swallow precautions. Measure Maximum Inspiratory Pressure (MIP) and Maximum Expiratory Pressure (MEP) to  target voice and dysphagia - MIP was 36cm/H2O (average for her age and gender is 74.08) MEP was 50 cm/H2O (averaged for age and gender is 61.92). Initiated training in RMST with device set at 25cm H2) for EMST and 30 for IMST - these are approximately 75% of MIP and MEP values. With usual mod A, Miquel demonstrated 5 sets of 5 reps of EMST and IMST. HEP is to complete 5 sets of 5 reps 3x a day. Speech: HEP for voice completed after modeling and instruction - she has been completing this inconsistently since evaluation. Mod A required for glides and modeling required for high pitch and low pitch sentences.   03/20/24: Voice evaluation completed. As pt stimulable for clear phonation and volume with increased vocal intensity, initiated training of high intensity voice exercises for HEP - with usual min modeling and verbal cues, Shamila completed HEP including sustained vowel, pitch glides, oral reading in high and low pitch and reading with increased vocal intensity. No evidence of strain. Clear phonation maintained in all exercises. Initiated training on throat clear alternatives and identifying when she is speaking on residual air with usual mod A. Instructed her to try chewable Vit C for ease of swallowing.   PATIENT EDUCATION: Education details: HEP for voice, general swallow precautions, vocal hygiene Person educated: Patient and Arts Administrator Education method: Explanation, Demonstration, Verbal cues, and Handouts Education comprehension: verbal cues required and needs further education  HOME EXERCISE PROGRAM: High intensity voice exercises, PhoRTE  GOALS: Goals reviewed with patient? Yes  SHORT TERM GOALS: Target date: 05/16/24  Pt will complete HEP for dysphonia with mod I twice daily Baseline: Goal status: ONGOING  2.  Pt will average 74dB 18/20 sentences Baseline:  Goal status: ONGOING  3.  Pt will follow diet modifications and swallow precautions pending MBSS with mod I Baseline:   Goal status: ONGOING  4.  Pt will complete RMST at 75% of MIP and MEP 75 reps daily with rare min A Baseline:  Goal status: ONGOING  5.  Pt will ID and correct when speaking on residual air Baseline:  Goal status: ONGOING  6.  Pt will use throat clear alternatives 4/5 opportunities with occasional min A Baseline:  Goal status: ONGOING  LONG TERM GOALS: Target date: 06/12/24  Pt will complete HEP for dysphagia with mod I - pending MBSS Baseline:  Goal status: ONGOING  2.  Pt will maintain clear phonation and average 72dB over  15 minute conversation Baseline:  Goal status: ONGOING  3.  Pt will self correct speaking on residual volume with mod I Baseline:  Goal status: ONGOING  4.  Pt will improve score on VRQOL Baseline:  Goal status: ONGOING  ASSESSMENT:  CLINICAL IMPRESSION: Patient is a 73 y.o. female who was seen today for dysphonia and dysphagia. She reports ongoing hoarseness, running out of air when speaking and difficulty swallowing dense protein and larger pills. She is taking larger Vit C pills with applesauce, however they do get stuck - cutting them made raw edges which irritated her throat. She reports dysphagia since anterior neck surgery after he accident. She and her caregiver report dysphagia is worsening. Truth does endorse phlegm after eating and after taking meds, which she clears by using multiple throat clears. She denies h/o MBSS. She was on pepcid for reflux which she stated she used to have however she has stopped taking this. Her voice is hoarse, with episodes of minimal aphonia when air runs out. She is noted to clear her throat repeatedly and speak on residual air, increasing vocal fry. 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 She failed [employer] screen. I recommend MBSS and skilled ST to maximize intelligibility and safety of swallow for safety, independence, to reduce caregiver burden as well as improve  QOL/social participation. Consider referral to laryngologist to r/o vocal fold pathology due to progressive dysphonia and dysphagia  OBJECTIVE IMPAIRMENTS: include expressive language, voice disorder, and dysphagia. These impairments are limiting patient from effectively communicating at home and in community and safety when swallowing. Factors affecting potential to achieve goals and functional outcome are co-morbidities.. Patient will benefit from skilled SLP services to address above impairments and improve overall function.  REHAB POTENTIAL: Good  PLAN:  SLP FREQUENCY: 1-2x/week  SLP DURATION: 12 weeks  PLANNED INTERVENTIONS: Aspiration precaution training, Pharyngeal strengthening exercises, Diet toleration management , Language facilitation, Environmental controls, Trials of upgraded texture/liquids, Internal/external aids, Functional tasks, SLP instruction and feedback, Compensatory strategies, Patient/family education, [phone] Treatment of speech (30 or 45 min) , and [id number]- Speech Eval Behavioral Qualitative Voice Resonance, MBSS    [employer], CF-SLP 05/09/2024, 1:33 PM

## 2024-05-14 ENCOUNTER — Encounter: Payer: Self-pay | Admitting: Speech Pathology

## 2024-05-14 ENCOUNTER — Ambulatory Visit: Admitting: Physical Medicine and Rehabilitation

## 2024-05-14 ENCOUNTER — Ambulatory Visit: Attending: Physical Medicine and Rehabilitation | Admitting: Speech Pathology

## 2024-05-14 DIAGNOSIS — R498 Other voice and resonance disorders: Secondary | ICD-10-CM | POA: Diagnosis present

## 2024-05-14 NOTE — Therapy (Signed)
 OUTPATIENT SPEECH LANGUAGE PATHOLOGY VOICE TREATMENT   Patient Name: [name] MRN: [id number] DOB:[birth year], 73 y.o., female Today's Date: 05/14/2024  PCP: [name], MD REFERRING PROVIDER: [name], MD  END OF SESSION:  End of Session - 05/14/24 1220     Visit Number 5    Number of Visits 13    Date for Recertification  06/12/24    Authorization Type [employer]    SLP Start Time 1230    SLP Stop Time  1310    SLP Time Calculation (min) 40 min    Activity Tolerance Patient tolerated treatment well             Past Medical History:  Diagnosis Date   Arthritis 2019   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 Aneita   Fibroid 2004   Per Dr. Lenon   History of shingles    face and mouth   Hx of skin cancer, basal cell    Hyperlipidemia 2022   lower legs and feet   Rosacea    Sciatica of left side 09/28/2013   Scoliosis    noted on mri done for back pain   Past Surgical History:  Procedure Laterality Date   BUNIONECTOMY     SPINE SURGERY  07/28/20   fused C5-C6   Patient Active Problem List   Diagnosis Date Noted   Hematuria 12/28/2023   Medication management 12/26/2023   Buttock wound, left, subsequent encounter 03/16/2023   Bronchiectasis with acute exacerbation (HCC) 03/15/2023   Buttock wound, left, initial encounter 11/02/2022   Orthostatic hypotension 08/13/2022   Neurogenic bowel 05/03/2022   Spasticity 05/03/2022   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/03/2022   Nerve pain 05/03/2022   Medication monitoring encounter 01/08/2022   Neurogenic bladder 10/11/2021   Urinary incontinence 10/11/2021   ESBL (extended spectrum beta-lactamase) producing bacteria infection 10/09/2021   Recurrent UTI 10/09/2021   Quadriplegia, C5-C7 incomplete (HCC) 01/16/2021   History of spinal fracture 01/16/2021   Suprapubic catheter (HCC) 01/16/2021   Encounter for  routine gynecological examination 09/28/2013   Onychomycosis 09/28/2013   Foot deformity, acquired 03/26/2012   Encounter for preventive health examination 12/25/2010   ROSACEA 08/25/2009   Disturbance in sleep behavior 03/11/2008   SKIN CANCER, HX OF 03/11/2008   DYSURIA, HX OF 03/11/2008   Hyperlipidemia 02/10/2007   CERVICALGIA 02/10/2007    Onset date: 12/13/2023 (referral date)  REFERRING DIAG: G82.50 (ICD-10-CM) - Quadriplegia (HCC) R47.89 (ICD-10-CM) - Quality of voice, breathiness R13.10 (ICD-10-CM) - Dysphagia, unspecified type  THERAPY DIAG:  Other voice and resonance disorders  Rationale for Evaluation and Treatment: Rehabilitation  SUBJECTIVE:   SUBJECTIVE STATEMENT: It sounds to me like I don't have enough volume and that it is gravely.  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 Pt is a 73 yr old L handed female with hx of incomplete quadriplegia- 2/14 2022- fleeing the police in [place] on passenger 100 (high speed) miles/hour,  [name]; neurogenic bowel and bladder and spasticity; [name], [name] Here for f/u on Incomplete quadriplegia   B femur fractures December, 2024.  PAIN:  Are you having pain? Yes, chronic, generalized   PATIENT GOALS:To have a stronger voice and improved swallow    SOCIAL HISTORY: Occupation:  retired, research officer, trade union, gives presentations until recently Water intake: optimal Caffeine/alcohol intake: minimal Daily voice use: minimal   PATIENT REPORTED OUTCOME MEASURES (PROM): V-RQOL: 22 - She rated a 4 or a lot of a problem having trouble doing her job due to voice; a 3 or a medium amount of problem speaking loudly, running out of air when talking, feeling anxious or frustrated due to voice. A 2 or a little problem feeling depressed due to voice, having to repeat to be understood, and being less  outgoing due to voice.                                                                                                                            TREATMENT DATE:   05/14/24: Dysphagia/RMST: Pharyngeal strengthening exercises completed - Mendelson with extended time and occasional min verbal cues and modeling 8/12 trials. Introduced Masako - with max A, pt achieved 0/7 trials, however she demonstrates understanding of the exercise - will continue to practice at home. She verbalized swallow strategies of effortful swallow when eating and drinking with rare min A. IMST completed with rare min A 5 sets of 5 reps; EMST completed with rare min A 5 sets of 5 reps. Speech: [name] enters with hoarse voice, she reports she has been hoarse all day - Completed resonant voice exercises at syllable, word, phrase and sentence with rare min A to achieve clear phonation with /m, p, b, v, z/ phonemes. Oral reading sentences with forward focus maintained clear phonation with rare min A. Targeted carryover of clear phonation and WNL volume into conversation by [name] role playing introducing her self at a conference, again with intentional speech voice remained clear. In task generating 1-2 sentence descriptions with clear voice - [name] maintained clear voice 5/5 trials. She does report effort and fatigue when she uses higher intensity voice. She verbalized awareness that she can control her voice. Added to HEP 5 minute of conversation daily in her clear voice, as well as practicing what she says at conferences.     05/09/24: [name] was present for session. Pt wants to try to present at a couple of conferences in the future. Pt gets frustrated when people finish her sentence during conversation due to voice difficulties. SLP educated pt about self-advocacy before and during conversation to inform conversation partners about needing time to optimize voice. SLP reviewed physiology of voice (respiration, phonation,  artic/resonance). SLP reviewed Resonant Voice Therapy (RVT) to increase and engage forward resonance for balanced phonation. During production of RVT /b, p, z/ words and phrases, pt achieved clear vocal quality in 90% of opportunities given frequent min A. SLP promoted pt self-awareness of dysphonic vocal quality to optimize use of voice strategies for generalization to conversation. Pt increasingly demonstrates awareness of voice quality pre vs. post structured exercises. SLP introduced conversation therapy training (CTT) to minimize laryngeal hyperfunction during conversation. Pt utilized forward resonance and diaphragmatic breath support during conversation 60% of the time given  frequent min A. Pt's awareness during conversation improved throughout session. By end of session, pt was highly engaged in using voice strategies for achieving clear vocal quality. SLP provided pt HEP focused on sentence readings and continued RVT training with more sounds /m, d/. Plan is to review IMST/EMST in upcoming session and to address swallow needs. Voice therapy will continue in upcoming session as well.   05/08/24: EMST/IMST/Swallowing: [name] was present for session. Pt had questions regarding use of EMST/IMST device. SLP guided pt and [name] through how to assemble EMST/IMST device and reviewed instructions for utilizing the device for pt. Pt performed EMST with 100% consistency at 28 cmH20 for 30 reps given rare min A. Pt performed IMST for 5 reps at 28 cmH20 with 90% consistency with rare min A. SLP encouraged pt to continue HEP program for EMST/IMST to optimize expiratory/inspiratory muscle strength for voice and swallowing. Pt and [name] were curious about recent MBSS results, especially wanting to know if results were serious. SLP educated pt about MBSS results. Pt was also curious about reason for performing hard swallow in the place of a cough. SLP instructed pt about cough suppression strategies vs. Cough strategies  for protecting upper airway during swallowing. Plan is to continue EMST/IMST in upcoming session and target pharyngeal strengthening exercises for promoting swallow function.   Voice: SLP educated pt about physiology of voice (respiration, phonation, artic/resonance). SLP introduced Resonant Voice Therapy (RVT) to optimize pt vocal quality and volume. During production of RVT /b, v, z, k/ syllables and words, pt achieved clear vocal quality in 90% of opportunities. Tongue and lip trills for semi-occluded vocal tract exercises were attempted with no apparent success for pt. SLP promoted attention to self-awareness of voice during structured exercises compared to voice prior to RVT to optimize pt awareness of voice. Pt demonstrates emerging awareness of voice quality pre vs. Post structured exercises. SLP provided HEP involving stimuli for targeted sounds during session for increasing forward resonance. Plan is to target sustained phonation /ah/ for increasing vocal volume and continuing RVT for optimizing balanced phonation.   05/02/24:  Swallow:  results and recommendations of MBSS - initiated training in HEP for pharyngeal dysphagia. Mendelson exercise completed 8x with occasional min modeling and verbal cues after initial instructions. With rare min questioning cues, [name] verbalized swallow precautions. Measure Maximum Inspiratory Pressure (MIP) and Maximum Expiratory Pressure (MEP) to target voice and dysphagia - MIP was 36cm/H2O (average for her age and gender is 67.08) MEP was 27 cm/H2O (averaged for age and gender is 61.92). Initiated training in RMST with device set at 25cm H2) for EMST and 30 for IMST - these are approximately 75% of MIP and MEP values. With usual mod A, [name] demonstrated 5 sets of 5 reps of EMST and IMST. HEP is to complete 5 sets of 5 reps 3x a day. Speech: HEP for voice completed after modeling and instruction - she has been completing this inconsistently since evaluation. Mod  A required for glides and modeling required for high pitch and low pitch sentences.   03/20/24: Voice evaluation completed. As pt stimulable for clear phonation and volume with increased vocal intensity, initiated training of high intensity voice exercises for HEP - with usual min modeling and verbal cues, [name] completed HEP including sustained vowel, pitch glides, oral reading in high and low pitch and reading with increased vocal intensity. No evidence of strain. Clear phonation maintained in all exercises. Initiated training on throat clear alternatives and identifying when she is speaking  on residual air with usual mod A. Instructed her to try chewable Vit C for ease of swallowing.   PATIENT EDUCATION: Education details: HEP for voice, general swallow precautions, vocal hygiene Person educated: Patient and Arts Administrator Education method: Explanation, Demonstration, Verbal cues, and Handouts Education comprehension: verbal cues required and needs further education  HOME EXERCISE PROGRAM: High intensity voice exercises, PhoRTE  GOALS: Goals reviewed with patient? Yes  SHORT TERM GOALS: Target date: 05/16/24  Pt will complete HEP for dysphonia with mod I twice daily Baseline: Goal status: ONGOING  2.  Pt will average 74dB 18/20 sentences Baseline:  Goal status: ONGOING  3.  Pt will follow diet modifications and swallow precautions pending MBSS with mod I Baseline:  Goal status: ONGOING  4.  Pt will complete RMST at 75% of MIP and MEP 75 reps daily with rare min A Baseline:  Goal status: ONGOING  5.  Pt will ID and correct when speaking on residual air Baseline:  Goal status: ONGOING  6.  Pt will use throat clear alternatives 4/5 opportunities with occasional min A Baseline:  Goal status: ONGOING  LONG TERM GOALS: Target date: 06/12/24  Pt will complete HEP for dysphagia with mod I - pending MBSS Baseline:  Goal status: ONGOING  2.  Pt will maintain clear phonation  and average 72dB over 15 minute conversation Baseline:  Goal status: ONGOING  3.  Pt will self correct speaking on residual volume with mod I Baseline:  Goal status: ONGOING  4.  Pt will improve score on VRQOL Baseline:  Goal status: ONGOING  ASSESSMENT:  CLINICAL IMPRESSION: Patient is a 73 y.o. female who was seen today for dysphonia and dysphagia. She reports ongoing hoarseness, running out of air when speaking and difficulty swallowing dense protein and larger pills. She is taking larger Vit C pills with applesauce, however they do get stuck - cutting them made raw edges which irritated her throat. She reports dysphagia since anterior neck surgery after he accident. She and her caregiver report dysphagia is worsening. Sherena does endorse phlegm after eating and after taking meds, which she clears by using multiple throat clears. She denies h/o MBSS. She was on pepcid for reflux which she stated she used to have however she has stopped taking this. Her voice is hoarse, with episodes of minimal aphonia when air runs out. She is noted to clear her throat repeatedly and speak on residual air, increasing vocal fry. 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 She failed [employer] screen. I recommend MBSS and skilled ST to maximize intelligibility and safety of swallow for safety, independence, to reduce caregiver burden as well as improve QOL/social participation. Consider referral to laryngologist to r/o vocal fold pathology due to progressive dysphonia and dysphagia  OBJECTIVE IMPAIRMENTS: include expressive language, voice disorder, and dysphagia. These impairments are limiting patient from effectively communicating at home and in community and safety when swallowing. Factors affecting potential to achieve goals and functional outcome are co-morbidities.. Patient will benefit from skilled SLP services to address above impairments and improve overall  function.  REHAB POTENTIAL: Good  PLAN:  SLP FREQUENCY: 1-2x/week  SLP DURATION: 12 weeks  PLANNED INTERVENTIONS: Aspiration precaution training, Pharyngeal strengthening exercises, Diet toleration management , Language facilitation, Environmental controls, Trials of upgraded texture/liquids, Internal/external aids, Functional tasks, SLP instruction and feedback, Compensatory strategies, Patient/family education, [phone] Treatment of speech (30 or 45 min) , and [id number]- Speech Eval Behavioral Qualitative Voice Resonance,  MBSS    [employer], CF-SLP 05/14/2024, 1:53 PM

## 2024-05-14 NOTE — Patient Instructions (Signed)
   Stick out your tongue and swallow  Half swallows  This week - practice your best voice in conversation - short durations -   Practice your speeches/spiels as well using your best voice  If you get a hoarse voice and can't fix it - go back to hums (mmmm, My, My Momma Makes Me muffins) to reset

## 2024-05-16 ENCOUNTER — Encounter: Payer: Self-pay | Admitting: Speech Pathology

## 2024-05-16 ENCOUNTER — Ambulatory Visit: Admitting: Speech Pathology

## 2024-05-16 DIAGNOSIS — R498 Other voice and resonance disorders: Secondary | ICD-10-CM | POA: Diagnosis not present

## 2024-05-16 NOTE — Therapy (Signed)
 OUTPATIENT SPEECH LANGUAGE PATHOLOGY VOICE TREATMENT   Patient Name: [name] MRN: [id number] DOB:[birth year], 73 y.o., female Today's Date: 05/16/2024  PCP: [name], MD REFERRING PROVIDER: [name], MD  END OF SESSION:  End of Session - 05/16/24 1248     Visit Number 6    Number of Visits 13    Date for Recertification  06/12/24    Authorization Type [employer]    SLP Start Time 1232    SLP Stop Time  1315    SLP Time Calculation (min) 43 min    Activity Tolerance Patient tolerated treatment well             Past Medical History:  Diagnosis Date   Arthritis 2019   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 Aneita   Fibroid 2004   Per Dr. Lenon   History of shingles    face and mouth   Hx of skin cancer, basal cell    Hyperlipidemia 2022   lower legs and feet   Rosacea    Sciatica of left side 09/28/2013   Scoliosis    noted on mri done for back pain   Past Surgical History:  Procedure Laterality Date   BUNIONECTOMY     SPINE SURGERY  07/28/20   fused C5-C6   Patient Active Problem List   Diagnosis Date Noted   Hematuria 12/28/2023   Medication management 12/26/2023   Buttock wound, left, subsequent encounter 03/16/2023   Bronchiectasis with acute exacerbation (HCC) 03/15/2023   Buttock wound, left, initial encounter 11/02/2022   Orthostatic hypotension 08/13/2022   Neurogenic bowel 05/03/2022   Spasticity 05/03/2022   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/03/2022   Nerve pain 05/03/2022   Medication monitoring encounter 01/08/2022   Neurogenic bladder 10/11/2021   Urinary incontinence 10/11/2021   ESBL (extended spectrum beta-lactamase) producing bacteria infection 10/09/2021   Recurrent UTI 10/09/2021   Quadriplegia, C5-C7 incomplete (HCC) 01/16/2021   History of spinal fracture 01/16/2021   Suprapubic catheter (HCC) 01/16/2021   Encounter for  routine gynecological examination 09/28/2013   Onychomycosis 09/28/2013   Foot deformity, acquired 03/26/2012   Encounter for preventive health examination 12/25/2010   ROSACEA 08/25/2009   Disturbance in sleep behavior 03/11/2008   SKIN CANCER, HX OF 03/11/2008   DYSURIA, HX OF 03/11/2008   Hyperlipidemia 02/10/2007   CERVICALGIA 02/10/2007    Onset date: 12/13/2023 (referral date)  REFERRING DIAG: G82.50 (ICD-10-CM) - Quadriplegia (HCC) R47.89 (ICD-10-CM) - Quality of voice, breathiness R13.10 (ICD-10-CM) - Dysphagia, unspecified type  THERAPY DIAG:  Other voice and resonance disorders  Rationale for Evaluation and Treatment: Rehabilitation  SUBJECTIVE:   SUBJECTIVE STATEMENT: It sounds to me like I don't have enough volume and that it is gravely.  Pt accompanied by: Live in caregiver, [name]  PERTINENT HISTORY: C7 ASIA C- incomplete quad w/ neurogenic bladder and bowel, HLD, Hx of skin cancer. Pt is a 73 yr old L handed female with hx of incomplete quadriplegia- 2/14 2022- fleeing the police in [place] on passenger 100 (high speed) miles/hour,  [name]; neurogenic bowel and bladder and spasticity; [name], [name] Here for f/u on Incomplete quadriplegia   B femur fractures December, 2024.  PAIN:  Are you having pain? Yes, chronic, generalized   PATIENT GOALS:To have a stronger voice and improved swallow    SOCIAL HISTORY: Occupation:  retired, research officer, trade union, gives presentations until recently Water intake: optimal Caffeine/alcohol intake: minimal Daily voice use: minimal   PATIENT REPORTED OUTCOME MEASURES (PROM): V-RQOL: 22 - She rated a 4 or a lot of a problem having trouble doing her job due to voice; a 3 or a medium amount of problem speaking loudly, running out of air when talking, feeling anxious or frustrated due to voice. A 2 or a little problem feeling depressed due to voice, having to repeat to be understood, and being less  outgoing due to voice.                                                                                                                         TREATMENT DATE:   05/16/24: Increased IMST and EMST by 1/4 turn [name] Notice completed 5 set of 5 reps of both EMST and IMST - she noted increased difficulty however she rated effort 7-8/10. RMST completed with rare min A to mod I. HEP for voice - sustained Ah 10/10 trials with clear phonation sustained 8-10 seconds with rare min A, Glides completed 10/10 with rare min A. Paragraph reading with increased vocal intensity resulted in clear phonation 2/2 paragraphs 7-12 sentences (Grandfather and full Rainbow passage) with average of 72-74dB. Jahara endorsed feeling like she was shouting. With sound level meter and education re: WNL volume, explained that she is not shouting. Her personal care aid also reinforced that she is not shouting. She completed Masako 5/10 and Mendelson 6/10x with occasoinal min A.   05/14/24: Dysphagia/RMST: Pharyngeal strengthening exercises completed - Mendelson with extended time and occasional min verbal cues and modeling 8/12 trials. Introduced Masako - with max A, pt achieved 0/7 trials, however she demonstrates understanding of the exercise - will continue to practice at home. She verbalized swallow strategies of effortful swallow when eating and drinking with rare min A. IMST completed with rare min A 5 sets of 5 reps; EMST completed with rare min A 5 sets of 5 reps. Speech: Jina enters with hoarse voice, she reports she has been hoarse all day - Completed resonant voice exercises at syllable, word, phrase and sentence with rare min A to achieve clear phonation with /m, p, b, v, z/ phonemes. Oral reading sentences with forward focus maintained clear phonation with rare min A. Targeted carryover of clear phonation and WNL volume into conversation by Notice role playing introducing her self at a conference, again with intentional speech  voice remained clear. In task generating 1-2 sentence descriptions with clear voice - Tammara maintained clear voice 5/5 trials. She does report effort and fatigue when she uses higher intensity voice. She verbalized awareness that she can control her voice. Added to HEP 5 minute of conversation daily in her clear voice, as well as practicing what she says at conferences.     05/09/24: [name] was present for session. Pt wants to try to present at a couple of conferences in the future. Pt gets frustrated when people  finish her sentence during conversation due to voice difficulties. SLP educated pt about self-advocacy before and during conversation to inform conversation partners about needing time to optimize voice. SLP reviewed physiology of voice (respiration, phonation, artic/resonance). SLP reviewed Resonant Voice Therapy (RVT) to increase and engage forward resonance for balanced phonation. During production of RVT /b, p, z/ words and phrases, pt achieved clear vocal quality in 90% of opportunities given frequent min A. SLP promoted pt self-awareness of dysphonic vocal quality to optimize use of voice strategies for generalization to conversation. Pt increasingly demonstrates awareness of voice quality pre vs. post structured exercises. SLP introduced conversation therapy training (CTT) to minimize laryngeal hyperfunction during conversation. Pt utilized forward resonance and diaphragmatic breath support during conversation 60% of the time given frequent min A. Pt's awareness during conversation improved throughout session. By end of session, pt was highly engaged in using voice strategies for achieving clear vocal quality. SLP provided pt HEP focused on sentence readings and continued RVT training with more sounds /m, d/. Plan is to review IMST/EMST in upcoming session and to address swallow needs. Voice therapy will continue in upcoming session as well.   05/08/24: EMST/IMST/Swallowing: [name] was present  for session. Pt had questions regarding use of EMST/IMST device. SLP guided pt and [name] through how to assemble EMST/IMST device and reviewed instructions for utilizing the device for pt. Pt performed EMST with 100% consistency at 28 cmH20 for 30 reps given rare min A. Pt performed IMST for 5 reps at 28 cmH20 with 90% consistency with rare min A. SLP encouraged pt to continue HEP program for EMST/IMST to optimize expiratory/inspiratory muscle strength for voice and swallowing. Pt and [name] were curious about recent MBSS results, especially wanting to know if results were serious. SLP educated pt about MBSS results. Pt was also curious about reason for performing hard swallow in the place of a cough. SLP instructed pt about cough suppression strategies vs. Cough strategies for protecting upper airway during swallowing. Plan is to continue EMST/IMST in upcoming session and target pharyngeal strengthening exercises for promoting swallow function.   Voice: SLP educated pt about physiology of voice (respiration, phonation, artic/resonance). SLP introduced Resonant Voice Therapy (RVT) to optimize pt vocal quality and volume. During production of RVT /b, v, z, k/ syllables and words, pt achieved clear vocal quality in 90% of opportunities. Tongue and lip trills for semi-occluded vocal tract exercises were attempted with no apparent success for pt. SLP promoted attention to self-awareness of voice during structured exercises compared to voice prior to RVT to optimize pt awareness of voice. Pt demonstrates emerging awareness of voice quality pre vs. Post structured exercises. SLP provided HEP involving stimuli for targeted sounds during session for increasing forward resonance. Plan is to target sustained phonation /ah/ for increasing vocal volume and continuing RVT for optimizing balanced phonation.   05/02/24:  Swallow:  results and recommendations of MBSS - initiated training in HEP for pharyngeal dysphagia.  Mendelson exercise completed 8x with occasional min modeling and verbal cues after initial instructions. With rare min questioning cues, Bridey verbalized swallow precautions. Measure Maximum Inspiratory Pressure (MIP) and Maximum Expiratory Pressure (MEP) to target voice and dysphagia - MIP was 36cm/H2O (average for her age and gender is 36.08) MEP was 6 cm/H2O (averaged for age and gender is 61.92). Initiated training in RMST with device set at 25cm H2) for EMST and 30 for IMST - these are approximately 75% of MIP and MEP values. With usual cora DELENA [name] demonstrated 5  sets of 5 reps of EMST and IMST. HEP is to complete 5 sets of 5 reps 3x a day. Speech: HEP for voice completed after modeling and instruction - she has been completing this inconsistently since evaluation. Mod A required for glides and modeling required for high pitch and low pitch sentences.   03/20/24: Voice evaluation completed. As pt stimulable for clear phonation and volume with increased vocal intensity, initiated training of high intensity voice exercises for HEP - with usual min modeling and verbal cues, Lawren completed HEP including sustained vowel, pitch glides, oral reading in high and low pitch and reading with increased vocal intensity. No evidence of strain. Clear phonation maintained in all exercises. Initiated training on throat clear alternatives and identifying when she is speaking on residual air with usual mod A. Instructed her to try chewable Vit C for ease of swallowing.   PATIENT EDUCATION: Education details: HEP for voice, general swallow precautions, vocal hygiene Person educated: Patient and Arts Administrator Education method: Explanation, Demonstration, Verbal cues, and Handouts Education comprehension: verbal cues required and needs further education  HOME EXERCISE PROGRAM: High intensity voice exercises, PhoRTE  GOALS: Goals reviewed with patient? Yes  SHORT TERM GOALS: Target date: 05/16/24  Pt will  complete HEP for dysphonia with mod I twice daily Baseline: Goal status: ONGOING  2.  Pt will average 74dB 18/20 sentences Baseline:  Goal status: ONGOING  3.  Pt will follow diet modifications and swallow precautions pending MBSS with mod I Baseline:  Goal status: ONGOING  4.  Pt will complete RMST at 75% of MIP and MEP 75 reps daily with rare min A Baseline:  Goal status: ONGOING  5.  Pt will ID and correct when speaking on residual air Baseline:  Goal status: ONGOING  6.  Pt will use throat clear alternatives 4/5 opportunities with occasional min A Baseline:  Goal status: ONGOING  LONG TERM GOALS: Target date: 06/12/24  Pt will complete HEP for dysphagia with mod I - pending MBSS Baseline:  Goal status: ONGOING  2.  Pt will maintain clear phonation and average 72dB over 15 minute conversation Baseline:  Goal status: ONGOING  3.  Pt will self correct speaking on residual volume with mod I Baseline:  Goal status: ONGOING  4.  Pt will improve score on VRQOL Baseline:  Goal status: ONGOING  ASSESSMENT:  CLINICAL IMPRESSION: Patient is a 73 y.o. female who was seen today for dysphonia and dysphagia. She reports ongoing hoarseness, running out of air when speaking and difficulty swallowing dense protein and larger pills. She is taking larger Vit C pills with applesauce, however they do get stuck - cutting them made raw edges which irritated her throat. She reports dysphagia since anterior neck surgery after he accident. She and her caregiver report dysphagia is worsening. Roxanna does endorse phlegm after eating and after taking meds, which she clears by using multiple throat clears. She denies h/o MBSS. She was on pepcid for reflux which she stated she used to have however she has stopped taking this. Her voice is hoarse, with episodes of minimal aphonia when air runs out. She is noted to clear her throat repeatedly and speak on residual air, increasing vocal fry. 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 She failed [employer] screen. I recommend MBSS and skilled ST to maximize intelligibility and safety of swallow for safety, independence, to reduce caregiver burden as well as improve QOL/social participation. Consider referral  to laryngologist to r/o vocal fold pathology due to progressive dysphonia and dysphagia  OBJECTIVE IMPAIRMENTS: include expressive language, voice disorder, and dysphagia. These impairments are limiting patient from effectively communicating at home and in community and safety when swallowing. Factors affecting potential to achieve goals and functional outcome are co-morbidities.. Patient will benefit from skilled SLP services to address above impairments and improve overall function.  REHAB POTENTIAL: Good  PLAN:  SLP FREQUENCY: 1-2x/week  SLP DURATION: 12 weeks  PLANNED INTERVENTIONS: Aspiration precaution training, Pharyngeal strengthening exercises, Diet toleration management , Language facilitation, Environmental controls, Trials of upgraded texture/liquids, Internal/external aids, Functional tasks, SLP instruction and feedback, Compensatory strategies, Patient/family education, [phone] Treatment of speech (30 or 45 min) , and [id number]- Speech Eval Behavioral Qualitative Voice Resonance, MBSS    [employer], CF-SLP 05/16/2024, 3:05 PM

## 2024-05-21 ENCOUNTER — Encounter: Payer: Self-pay | Admitting: Speech Pathology

## 2024-05-21 ENCOUNTER — Ambulatory Visit: Admitting: Speech Pathology

## 2024-05-21 DIAGNOSIS — R498 Other voice and resonance disorders: Secondary | ICD-10-CM | POA: Diagnosis not present

## 2024-05-21 NOTE — Therapy (Signed)
 OUTPATIENT SPEECH LANGUAGE PATHOLOGY VOICE TREATMENT   Patient Name: [name] MRN: [id number] DOB:09/25/1950, 73 y.o., female Today's Date: 05/21/2024  PCP: [name], MD REFERRING PROVIDER: [name], MD  END OF SESSION:  End of Session - 05/21/24 1234     Visit Number 7    Number of Visits 13    Date for Recertification  06/12/24    Authorization Type [employer]    SLP Start Time 1230    SLP Stop Time  1315    SLP Time Calculation (min) 45 min    Activity Tolerance Patient tolerated treatment well             Past Medical History:  Diagnosis Date   Arthritis 2019   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 Aneita   Fibroid 2004   Per Dr. Lenon   History of shingles    face and mouth   Hx of skin cancer, basal cell    Hyperlipidemia 2022   lower legs and feet   Rosacea    Sciatica of left side 09/28/2013   Scoliosis    noted on mri done for back pain   Past Surgical History:  Procedure Laterality Date   BUNIONECTOMY     SPINE SURGERY  07/28/20   fused C5-C6   Patient Active Problem List   Diagnosis Date Noted   Hematuria 12/28/2023   Medication management 12/26/2023   Buttock wound, left, subsequent encounter 03/16/2023   Bronchiectasis with acute exacerbation (HCC) 03/15/2023   Buttock wound, left, initial encounter 11/02/2022   Orthostatic hypotension 08/13/2022   Neurogenic bowel 05/03/2022   Spasticity 05/03/2022   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/03/2022   Nerve pain 05/03/2022   Medication monitoring encounter 01/08/2022   Neurogenic bladder 10/11/2021   Urinary incontinence 10/11/2021   ESBL (extended spectrum beta-lactamase) producing bacteria infection 10/09/2021   Recurrent UTI 10/09/2021   Quadriplegia, C5-C7 incomplete (HCC) 01/16/2021   History of spinal fracture 01/16/2021   Suprapubic catheter (HCC) 01/16/2021   Encounter for  routine gynecological examination 09/28/2013   Onychomycosis 09/28/2013   Foot deformity, acquired 03/26/2012   Encounter for preventive health examination 12/25/2010   ROSACEA 08/25/2009   Disturbance in sleep behavior 03/11/2008   SKIN CANCER, HX OF 03/11/2008   DYSURIA, HX OF 03/11/2008   Hyperlipidemia 02/10/2007   CERVICALGIA 02/10/2007    Onset date: 12/13/2023 (referral date)  REFERRING DIAG: G82.50 (ICD-10-CM) - Quadriplegia (HCC) R47.89 (ICD-10-CM) - Quality of voice, breathiness R13.10 (ICD-10-CM) - Dysphagia, unspecified type  THERAPY DIAG:  Other voice and resonance disorders  Rationale for Evaluation and Treatment: Rehabilitation  SUBJECTIVE:   SUBJECTIVE STATEMENT: It sounds to me like I don't have enough volume and that it is gravely.  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 Pt is a 73 yr old L handed female with hx of incomplete quadriplegia- 2/14 2022- fleeing the police in [place] on passenger 100 (high speed) miles/hour,  [name]; neurogenic bowel and bladder and spasticity; [name], [name] Here for f/u on Incomplete quadriplegia   B femur fractures December, 2024.  PAIN:  Are you having pain? Yes, chronic, generalized   PATIENT GOALS:To have a stronger voice and improved swallow    SOCIAL HISTORY: Occupation:  retired, research officer, trade union, gives presentations until recently Water intake: optimal Caffeine/alcohol intake: minimal Daily voice use: minimal   PATIENT REPORTED OUTCOME MEASURES (PROM): V-RQOL: 22 - She rated a 4 or a lot of a problem having trouble doing her job due to voice; a 3 or a medium amount of problem speaking loudly, running out of air when talking, feeling anxious or frustrated due to voice. A 2 or a little problem feeling depressed due to voice, having to repeat to be understood, and being less  outgoing due to voice.                                                                                                                         TREATMENT DATE:   Review HEP for dysphagia next session  05/21/24: [name] enters with low hoarse voice. With sustained ah, voice cleared with rare min A 10/10 trials. They report completing RMST twice daily. After HEP completed with rare min A, targeted carryover of clear phonation with WNL volume (72dB average) in conversation. [name] gave summary of an article she is reading, described her Christmas ornaments and told a family story - with rare min A she maintained WNL volume. Clear phonation maintained 75% of utterances. [name]'s when she becomes low and hoarse, however she required usual min to mod A to correct hoarseness. She continues to work on using throat clear alternatives.   05/16/24: Increased IMST and EMST by 1/4 turn [name] [name] completed 5 set of 5 reps of both EMST and IMST - she noted increased difficulty however she rated effort 7-8/10. RMST completed with rare min A to mod I. HEP for voice - sustained Ah 10/10 trials with clear phonation sustained 8-10 seconds with rare min A, Glides completed 10/10 with rare min A. Paragraph reading with increased vocal intensity resulted in clear phonation 2/2 paragraphs 7-12 sentences (Grandfather and full Rainbow passage) with average of 72-74dB. [name] endorsed feeling like she was shouting. With sound level meter and education re: WNL volume, explained that she is not shouting. Her personal care aid also reinforced that she is not shouting. She completed Masako 5/10 and Mendelson 6/10x with occasoinal min A.   05/14/24: Dysphagia/RMST: Pharyngeal strengthening exercises completed - Mendelson with extended time and occasional min verbal cues and modeling 8/12 trials. Introduced Masako - with max A, pt achieved 0/7 trials, however she demonstrates understanding of the exercise - will continue to practice at  home. She verbalized swallow strategies of effortful swallow when eating and drinking with rare min A. IMST completed with rare min A 5 sets of 5 reps; EMST completed with rare min A 5 sets of 5 reps. Speech: [name] enters with hoarse voice, she reports she has been hoarse all day - Completed resonant voice exercises at syllable, word, phrase and sentence with rare min A to achieve clear phonation with /m, p, b, v, z/ phonemes. Oral reading sentences with forward focus maintained clear phonation with rare min A. Targeted  carryover of clear phonation and WNL volume into conversation by [name] role playing introducing her self at a conference, again with intentional speech voice remained clear. In task generating 1-2 sentence descriptions with clear voice - Lashanta maintained clear voice 5/5 trials. She does report effort and fatigue when she uses higher intensity voice. She verbalized awareness that she can control her voice. Added to HEP 5 minute of conversation daily in her clear voice, as well as practicing what she says at conferences.     05/09/24: [name] was present for session. Pt wants to try to present at a couple of conferences in the future. Pt gets frustrated when people finish her sentence during conversation due to voice difficulties. SLP educated pt about self-advocacy before and during conversation to inform conversation partners about needing time to optimize voice. SLP reviewed physiology of voice (respiration, phonation, artic/resonance). SLP reviewed Resonant Voice Therapy (RVT) to increase and engage forward resonance for balanced phonation. During production of RVT /b, p, z/ words and phrases, pt achieved clear vocal quality in 90% of opportunities given frequent min A. SLP promoted pt self-awareness of dysphonic vocal quality to optimize use of voice strategies for generalization to conversation. Pt increasingly demonstrates awareness of voice quality pre vs. post structured exercises.  SLP introduced conversation therapy training (CTT) to minimize laryngeal hyperfunction during conversation. Pt utilized forward resonance and diaphragmatic breath support during conversation 60% of the time given frequent min A. Pt's awareness during conversation improved throughout session. By end of session, pt was highly engaged in using voice strategies for achieving clear vocal quality. SLP provided pt HEP focused on sentence readings and continued RVT training with more sounds /m, d/. Plan is to review IMST/EMST in upcoming session and to address swallow needs. Voice therapy will continue in upcoming session as well.   05/08/24: EMST/IMST/Swallowing: [name] was present for session. Pt had questions regarding use of EMST/IMST device. SLP guided pt and [name] through how to assemble EMST/IMST device and reviewed instructions for utilizing the device for pt. Pt performed EMST with 100% consistency at 28 cmH20 for 30 reps given rare min A. Pt performed IMST for 5 reps at 28 cmH20 with 90% consistency with rare min A. SLP encouraged pt to continue HEP program for EMST/IMST to optimize expiratory/inspiratory muscle strength for voice and swallowing. Pt and [name] were curious about recent MBSS results, especially wanting to know if results were serious. SLP educated pt about MBSS results. Pt was also curious about reason for performing hard swallow in the place of a cough. SLP instructed pt about cough suppression strategies vs. Cough strategies for protecting upper airway during swallowing. Plan is to continue EMST/IMST in upcoming session and target pharyngeal strengthening exercises for promoting swallow function.   Voice: SLP educated pt about physiology of voice (respiration, phonation, artic/resonance). SLP introduced Resonant Voice Therapy (RVT) to optimize pt vocal quality and volume. During production of RVT /b, v, z, k/ syllables and words, pt achieved clear vocal quality in 90% of opportunities. Tongue  and lip trills for semi-occluded vocal tract exercises were attempted with no apparent success for pt. SLP promoted attention to self-awareness of voice during structured exercises compared to voice prior to RVT to optimize pt awareness of voice. Pt demonstrates emerging awareness of voice quality pre vs. Post structured exercises. SLP provided HEP involving stimuli for targeted sounds during session for increasing forward resonance. Plan is to target sustained phonation /ah/ for increasing vocal volume and continuing RVT for optimizing balanced phonation.  05/02/24:  Swallow:  results and recommendations of MBSS - initiated training in HEP for pharyngeal dysphagia. Mendelson exercise completed 8x with occasional min modeling and verbal cues after initial instructions. With rare min questioning cues, Danyela verbalized swallow precautions. Measure Maximum Inspiratory Pressure (MIP) and Maximum Expiratory Pressure (MEP) to target voice and dysphagia - MIP was 36cm/H2O (average for her age and gender is 69.08) MEP was 72 cm/H2O (averaged for age and gender is 61.92). Initiated training in RMST with device set at 25cm H2) for EMST and 30 for IMST - these are approximately 75% of MIP and MEP values. With usual mod A, Hiya demonstrated 5 sets of 5 reps of EMST and IMST. HEP is to complete 5 sets of 5 reps 3x a day. Speech: HEP for voice completed after modeling and instruction - she has been completing this inconsistently since evaluation. Mod A required for glides and modeling required for high pitch and low pitch sentences.   03/20/24: Voice evaluation completed. As pt stimulable for clear phonation and volume with increased vocal intensity, initiated training of high intensity voice exercises for HEP - with usual min modeling and verbal cues, Kinya completed HEP including sustained vowel, pitch glides, oral reading in high and low pitch and reading with increased vocal intensity. No evidence of strain. Clear  phonation maintained in all exercises. Initiated training on throat clear alternatives and identifying when she is speaking on residual air with usual mod A. Instructed her to try chewable Vit C for ease of swallowing.   PATIENT EDUCATION: Education details: HEP for voice, general swallow precautions, vocal hygiene Person educated: Patient and Arts Administrator Education method: Explanation, Demonstration, Verbal cues, and Handouts Education comprehension: verbal cues required and needs further education  HOME EXERCISE PROGRAM: High intensity voice exercises, PhoRTE  GOALS: Goals reviewed with patient? Yes  SHORT TERM GOALS: Target date: 05/16/24  Pt will complete HEP for dysphonia with mod I twice daily Baseline: Goal status: ONGOING  2.  Pt will average 74dB 18/20 sentences Baseline:  Goal status: ONGOING  3.  Pt will follow diet modifications and swallow precautions pending MBSS with mod I Baseline:  Goal status: ONGOING  4.  Pt will complete RMST at 75% of MIP and MEP 75 reps daily with rare min A Baseline:  Goal status: ONGOING  5.  Pt will [name] and correct when speaking on residual air Baseline:  Goal status: ONGOING  6.  Pt will use throat clear alternatives 4/5 opportunities with occasional min A Baseline:  Goal status: ONGOING  LONG TERM GOALS: Target date: 06/12/24  Pt will complete HEP for dysphagia with mod I - pending MBSS Baseline:  Goal status: ONGOING  2.  Pt will maintain clear phonation and average 72dB over 15 minute conversation Baseline:  Goal status: ONGOING  3.  Pt will self correct speaking on residual volume with mod I Baseline:  Goal status: ONGOING  4.  Pt will improve score on VRQOL Baseline:  Goal status: ONGOING  ASSESSMENT:  CLINICAL IMPRESSION: Patient is a 73 y.o. female who was seen today for dysphonia and dysphagia. She reports ongoing hoarseness, running out of air when speaking and difficulty swallowing dense protein and  larger pills. She is taking larger Vit C pills with applesauce, however they do get stuck - cutting them made raw edges which irritated her throat. She reports dysphagia since anterior neck surgery after he accident. She and her caregiver report dysphagia is worsening. Rovena does endorse phlegm after eating and  after taking meds, which she clears by using multiple throat clears. She denies h/o MBSS. She was on pepcid for reflux which she stated she used to have however she has stopped taking this. Her voice is hoarse, with episodes of minimal aphonia when air runs out. She is noted to clear her throat repeatedly and speak on residual air, increasing vocal fry. 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 She failed [employer] screen. I recommend MBSS and skilled ST to maximize intelligibility and safety of swallow for safety, independence, to reduce caregiver burden as well as improve QOL/social participation. Consider referral to laryngologist to r/o vocal fold pathology due to progressive dysphonia and dysphagia  OBJECTIVE IMPAIRMENTS: include expressive language, voice disorder, and dysphagia. These impairments are limiting patient from effectively communicating at home and in community and safety when swallowing. Factors affecting potential to achieve goals and functional outcome are co-morbidities.. Patient will benefit from skilled SLP services to address above impairments and improve overall function.  REHAB POTENTIAL: Good  PLAN:  SLP FREQUENCY: 1-2x/week  SLP DURATION: 12 weeks  PLANNED INTERVENTIONS: Aspiration precaution training, Pharyngeal strengthening exercises, Diet toleration management , Language facilitation, Environmental controls, Trials of upgraded texture/liquids, Internal/external aids, Functional tasks, SLP instruction and feedback, Compensatory strategies, Patient/family education, [phone] Treatment of speech (30 or 45 min) , and [phone]-  Speech Eval Behavioral Qualitative Voice Resonance, MBSS    [employer], CF-SLP 05/21/2024, 1:18 PM

## 2024-05-23 ENCOUNTER — Ambulatory Visit: Admitting: Speech Pathology

## 2024-05-23 ENCOUNTER — Encounter: Payer: Self-pay | Admitting: Speech Pathology

## 2024-05-23 DIAGNOSIS — R498 Other voice and resonance disorders: Secondary | ICD-10-CM | POA: Diagnosis not present

## 2024-05-23 NOTE — Therapy (Signed)
 OUTPATIENT SPEECH LANGUAGE PATHOLOGY VOICE TREATMENT   Patient Name: [name] MRN: [id number] DOB:06-22-1950, 73 y.o., female Today's Date: 05/23/2024  PCP: [name], MD REFERRING PROVIDER: [name], MD  END OF SESSION:  End of Session - 05/23/24 1313     Visit Number 8    Number of Visits 13    Date for Recertification  06/12/24    Authorization Type [employer]    SLP Start Time 1230    SLP Stop Time  1309    SLP Time Calculation (min) 39 min    Activity Tolerance Patient tolerated treatment well             Past Medical History:  Diagnosis Date   Arthritis 2019   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 Aneita   Fibroid 2004   Per Dr. Lenon   History of shingles    face and mouth   Hx of skin cancer, basal cell    Hyperlipidemia 2022   lower legs and feet   Rosacea    Sciatica of left side 09/28/2013   Scoliosis    noted on mri done for back pain   Past Surgical History:  Procedure Laterality Date   BUNIONECTOMY     SPINE SURGERY  07/28/20   fused C5-C6   Patient Active Problem List   Diagnosis Date Noted   Hematuria 12/28/2023   Medication management 12/26/2023   Buttock wound, left, subsequent encounter 03/16/2023   Bronchiectasis with acute exacerbation (HCC) 03/15/2023   Buttock wound, left, initial encounter 11/02/2022   Orthostatic hypotension 08/13/2022   Neurogenic bowel 05/03/2022   Spasticity 05/03/2022   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/03/2022   Nerve pain 05/03/2022   Medication monitoring encounter 01/08/2022   Neurogenic bladder 10/11/2021   Urinary incontinence 10/11/2021   ESBL (extended spectrum beta-lactamase) producing bacteria infection 10/09/2021   Recurrent UTI 10/09/2021   Quadriplegia, C5-C7 incomplete (HCC) 01/16/2021   History of spinal fracture 01/16/2021   Suprapubic catheter (HCC) 01/16/2021   Encounter for  routine gynecological examination 09/28/2013   Onychomycosis 09/28/2013   Foot deformity, acquired 03/26/2012   Encounter for preventive health examination 12/25/2010   ROSACEA 08/25/2009   Disturbance in sleep behavior 03/11/2008   SKIN CANCER, HX OF 03/11/2008   DYSURIA, HX OF 03/11/2008   Hyperlipidemia 02/10/2007   CERVICALGIA 02/10/2007    Onset date: 12/13/2023 (referral date)  REFERRING DIAG: G82.50 (ICD-10-CM) - Quadriplegia (HCC) R47.89 (ICD-10-CM) - Quality of voice, breathiness R13.10 (ICD-10-CM) - Dysphagia, unspecified type  THERAPY DIAG:  Other voice and resonance disorders  Rationale for Evaluation and Treatment: Rehabilitation  SUBJECTIVE:   SUBJECTIVE STATEMENT: It sounds to me like I don't have enough volume and that it is gravely.  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 Pt is a 74 yr old L handed female with hx of incomplete quadriplegia- 2/14 2022- fleeing the police in [place] on passenger 100 (high speed) miles/hour,  [name]; neurogenic bowel and bladder and spasticity; [name], [name] Here for f/u on Incomplete quadriplegia   B femur fractures December, 2024.  PAIN:  Are you having pain? Yes, chronic, generalized   PATIENT GOALS:To have a stronger voice and improved swallow    SOCIAL HISTORY: Occupation:  retired, research officer, trade union, gives presentations until recently Water intake: optimal Caffeine/alcohol intake: minimal Daily voice use: minimal   PATIENT REPORTED OUTCOME MEASURES (PROM): V-RQOL: 22 - She rated a 4 or a lot of a problem having trouble doing her job due to voice; a 3 or a medium amount of problem speaking loudly, running out of air when talking, feeling anxious or frustrated due to voice. A 2 or a little problem feeling depressed due to voice, having to repeat to be understood, and being less  outgoing due to voice.                                                                                                                         TREATMENT DATE:   Review HEP for dysphagia next session  05/23/24: Prabhjot enters with WNL volume and voice. They are completing IMST with questions - she is unable to complete more than 7 reps on IMST - reduced pressure by 1/4 turn back to setting at eval. She completed 10 reps of IMST with rare min A at lower pressure. PhoRTE completed with supervision cues - averaged 72dB. Targeted conversation carryover of WNL volume and clear phonation over 20 minutes - she self corrected hoarse voice with rare min A. She noted improved stamina when speaking as well  05/21/24: Ayaan enters with low hoarse voice. With sustained ah, voice cleared with rare min A 10/10 trials. They report completing RMST twice daily. After HEP completed with rare min A, targeted carryover of clear phonation with WNL volume (72dB average) in conversation. Oza gave summary of an article she is reading, described her Christmas ornaments and told a family story - with rare min A she maintained WNL volume. Clear phonation maintained 75% of utterances. [name] when she becomes low and hoarse, however she required usual min to mod A to correct hoarseness. She continues to work on using throat clear alternatives.   05/16/24: Increased IMST and EMST by 1/4 turn [name] [name] completed 5 set of 5 reps of both EMST and IMST - she noted increased difficulty however she rated effort 7-8/10. RMST completed with rare min A to mod I. HEP for voice - sustained Ah 10/10 trials with clear phonation sustained 8-10 seconds with rare min A, Glides completed 10/10 with rare min A. Paragraph reading with increased vocal intensity resulted in clear phonation 2/2 paragraphs 7-12 sentences (Grandfather and full Rainbow passage) with average of 72-74dB. [name] endorsed feeling like she was shouting. With sound level  meter and education re: WNL volume, explained that she is not shouting. Her personal care aid also reinforced that she is not shouting. She completed Masako 5/10 and Mendelson 6/10x with occasoinal min A.   05/14/24: Dysphagia/RMST: Pharyngeal strengthening exercises completed - Mendelson with extended time and occasional min verbal cues and modeling 8/12 trials. Introduced Masako - with max A, pt achieved 0/7 trials, however she demonstrates understanding of the exercise - will continue to practice at home. She verbalized  swallow strategies of effortful swallow when eating and drinking with rare min A. IMST completed with rare min A 5 sets of 5 reps; EMST completed with rare min A 5 sets of 5 reps. Speech: Khaleah enters with hoarse voice, she reports she has been hoarse all day - Completed resonant voice exercises at syllable, word, phrase and sentence with rare min A to achieve clear phonation with /m, p, b, v, z/ phonemes. Oral reading sentences with forward focus maintained clear phonation with rare min A. Targeted carryover of clear phonation and WNL volume into conversation by [name] role playing introducing her self at a conference, again with intentional speech voice remained clear. In task generating 1-2 sentence descriptions with clear voice - Leola maintained clear voice 5/5 trials. She does report effort and fatigue when she uses higher intensity voice. She verbalized awareness that she can control her voice. Added to HEP 5 minute of conversation daily in her clear voice, as well as practicing what she says at conferences.     05/09/24: [name] was present for session. Pt wants to try to present at a couple of conferences in the future. Pt gets frustrated when people finish her sentence during conversation due to voice difficulties. SLP educated pt about self-advocacy before and during conversation to inform conversation partners about needing time to optimize voice. SLP reviewed physiology of  voice (respiration, phonation, artic/resonance). SLP reviewed Resonant Voice Therapy (RVT) to increase and engage forward resonance for balanced phonation. During production of RVT /b, p, z/ words and phrases, pt achieved clear vocal quality in 90% of opportunities given frequent min A. SLP promoted pt self-awareness of dysphonic vocal quality to optimize use of voice strategies for generalization to conversation. Pt increasingly demonstrates awareness of voice quality pre vs. post structured exercises. SLP introduced conversation therapy training (CTT) to minimize laryngeal hyperfunction during conversation. Pt utilized forward resonance and diaphragmatic breath support during conversation 60% of the time given frequent min A. Pt's awareness during conversation improved throughout session. By end of session, pt was highly engaged in using voice strategies for achieving clear vocal quality. SLP provided pt HEP focused on sentence readings and continued RVT training with more sounds /m, d/. Plan is to review IMST/EMST in upcoming session and to address swallow needs. Voice therapy will continue in upcoming session as well.   05/08/24: EMST/IMST/Swallowing: [name] was present for session. Pt had questions regarding use of EMST/IMST device. SLP guided pt and [name] through how to assemble EMST/IMST device and reviewed instructions for utilizing the device for pt. Pt performed EMST with 100% consistency at 28 cmH20 for 30 reps given rare min A. Pt performed IMST for 5 reps at 28 cmH20 with 90% consistency with rare min A. SLP encouraged pt to continue HEP program for EMST/IMST to optimize expiratory/inspiratory muscle strength for voice and swallowing. Pt and [name] were curious about recent MBSS results, especially wanting to know if results were serious. SLP educated pt about MBSS results. Pt was also curious about reason for performing hard swallow in the place of a cough. SLP instructed pt about cough suppression  strategies vs. Cough strategies for protecting upper airway during swallowing. Plan is to continue EMST/IMST in upcoming session and target pharyngeal strengthening exercises for promoting swallow function.   Voice: SLP educated pt about physiology of voice (respiration, phonation, artic/resonance). SLP introduced Resonant Voice Therapy (RVT) to optimize pt vocal quality and volume. During production of RVT /b, v, z, k/ syllables and words, pt achieved clear  vocal quality in 90% of opportunities. Tongue and lip trills for semi-occluded vocal tract exercises were attempted with no apparent success for pt. SLP promoted attention to self-awareness of voice during structured exercises compared to voice prior to RVT to optimize pt awareness of voice. Pt demonstrates emerging awareness of voice quality pre vs. Post structured exercises. SLP provided HEP involving stimuli for targeted sounds during session for increasing forward resonance. Plan is to target sustained phonation /ah/ for increasing vocal volume and continuing RVT for optimizing balanced phonation.   05/02/24:  Swallow:  results and recommendations of MBSS - initiated training in HEP for pharyngeal dysphagia. Mendelson exercise completed 8x with occasional min modeling and verbal cues after initial instructions. With rare min questioning cues, Caitlin verbalized swallow precautions. Measure Maximum Inspiratory Pressure (MIP) and Maximum Expiratory Pressure (MEP) to target voice and dysphagia - MIP was 36cm/H2O (average for her age and gender is 62.08) MEP was 83 cm/H2O (averaged for age and gender is 61.92). Initiated training in RMST with device set at 25cm H2) for EMST and 30 for IMST - these are approximately 75% of MIP and MEP values. With usual mod A, Jezelle demonstrated 5 sets of 5 reps of EMST and IMST. HEP is to complete 5 sets of 5 reps 3x a day. Speech: HEP for voice completed after modeling and instruction - she has been completing this  inconsistently since evaluation. Mod A required for glides and modeling required for high pitch and low pitch sentences.   03/20/24: Voice evaluation completed. As pt stimulable for clear phonation and volume with increased vocal intensity, initiated training of high intensity voice exercises for HEP - with usual min modeling and verbal cues, Arianne completed HEP including sustained vowel, pitch glides, oral reading in high and low pitch and reading with increased vocal intensity. No evidence of strain. Clear phonation maintained in all exercises. Initiated training on throat clear alternatives and identifying when she is speaking on residual air with usual mod A. Instructed her to try chewable Vit C for ease of swallowing.   PATIENT EDUCATION: Education details: HEP for voice, general swallow precautions, vocal hygiene Person educated: Patient and Arts Administrator Education method: Explanation, Demonstration, Verbal cues, and Handouts Education comprehension: verbal cues required and needs further education  HOME EXERCISE PROGRAM: High intensity voice exercises, PhoRTE  GOALS: Goals reviewed with patient? Yes  SHORT TERM GOALS: Target date: 05/16/24  Pt will complete HEP for dysphonia with mod I twice daily Baseline: Goal status: ONGOING  2.  Pt will average 74dB 18/20 sentences Baseline:  Goal status: ONGOING  3.  Pt will follow diet modifications and swallow precautions pending MBSS with mod I Baseline:  Goal status: ONGOING  4.  Pt will complete RMST at 75% of MIP and MEP 75 reps daily with rare min A Baseline:  Goal status: ONGOING  5.  Pt will ID and correct when speaking on residual air Baseline:  Goal status: ONGOING  6.  Pt will use throat clear alternatives 4/5 opportunities with occasional min A Baseline:  Goal status: ONGOING  LONG TERM GOALS: Target date: 06/12/24  Pt will complete HEP for dysphagia with mod I - pending MBSS Baseline:  Goal status:  ONGOING  2.  Pt will maintain clear phonation and average 72dB over 15 minute conversation Baseline:  Goal status: ONGOING  3.  Pt will self correct speaking on residual volume with mod I Baseline:  Goal status: ONGOING  4.  Pt will improve score on VRQOL  Baseline:  Goal status: ONGOING  ASSESSMENT:  CLINICAL IMPRESSION: Patient is a 73 y.o. female who was seen today for dysphonia and dysphagia. She reports ongoing hoarseness, running out of air when speaking and difficulty swallowing dense protein and larger pills. She is taking larger Vit C pills with applesauce, however they do get stuck - cutting them made raw edges which irritated her throat. She reports dysphagia since anterior neck surgery after he accident. She and her caregiver report dysphagia is worsening. Gwendy does endorse phlegm after eating and after taking meds, which she clears by using multiple throat clears. She denies h/o MBSS. She was on pepcid for reflux which she stated she used to have however she has stopped taking this. Her voice is hoarse, with episodes of minimal aphonia when air runs out. She is noted to clear her throat repeatedly and speak on residual air, increasing vocal fry. 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 She failed [employer] screen. I recommend MBSS and skilled ST to maximize intelligibility and safety of swallow for safety, independence, to reduce caregiver burden as well as improve QOL/social participation. Consider referral to laryngologist to r/o vocal fold pathology due to progressive dysphonia and dysphagia  OBJECTIVE IMPAIRMENTS: include expressive language, voice disorder, and dysphagia. These impairments are limiting patient from effectively communicating at home and in community and safety when swallowing. Factors affecting potential to achieve goals and functional outcome are co-morbidities.. Patient will benefit from skilled SLP services to  address above impairments and improve overall function.  REHAB POTENTIAL: Good  PLAN:  SLP FREQUENCY: 1-2x/week  SLP DURATION: 12 weeks  PLANNED INTERVENTIONS: Aspiration precaution training, Pharyngeal strengthening exercises, Diet toleration management , Language facilitation, Environmental controls, Trials of upgraded texture/liquids, Internal/external aids, Functional tasks, SLP instruction and feedback, Compensatory strategies, Patient/family education, [phone] Treatment of speech (30 or 45 min) , and [id number]- Speech Eval Behavioral Qualitative Voice Resonance, MBSS    [employer], CF-SLP 05/23/2024, 1:33 PM

## 2024-05-27 ENCOUNTER — Other Ambulatory Visit: Payer: Self-pay | Admitting: Internal Medicine

## 2024-05-28 ENCOUNTER — Encounter: Payer: Self-pay | Admitting: Speech Pathology

## 2024-05-28 ENCOUNTER — Ambulatory Visit: Admitting: Speech Pathology

## 2024-05-28 DIAGNOSIS — R498 Other voice and resonance disorders: Secondary | ICD-10-CM | POA: Diagnosis not present

## 2024-05-28 NOTE — Patient Instructions (Signed)
° °  [facility] - ENT local    You have tension in your larynx limiting the air flow when you speak, resulting in a hoarse or gravelly voice. We call this pressed speech   Use a tissue to focus on airflow:  Whooo 10x Shoe 10x Beverley 10x  Who are you?  Who is Beverley?  She sells sea shells  See Sue's shoes  Fifty-Fifty  Hit the hammer  High school hero  Hocus Pocus  To each his own  Zebra's zig zag at the zoo  [employer] chews cheddar cheese  Choosy moms choose [employer]  Fat Freddy prefers french fries  Vince vowed to vote  Feel the furry fish  She should polish her shoes  Show them the fresh fruit  Teachers eat ripe peaches at [employer]  Not now nor never  My mama makes me muffins  No one knows Norman's nickname  See Ginnie Sleep soundly by the sea

## 2024-05-28 NOTE — Therapy (Signed)
 OUTPATIENT SPEECH LANGUAGE PATHOLOGY VOICE TREATMENT   Patient Name: [name] MRN: [id number] DOB:1950/07/27, 73 y.o., female Today's Date: 05/28/2024  PCP: Carmen Apolinar POUR, MD REFERRING PROVIDER: [name], MD  END OF SESSION:  End of Session - 05/28/24 1233     Visit Number 9    Number of Visits 13    Date for Recertification  06/12/24    Authorization Type [employer]    SLP Start Time 1230    SLP Stop Time  1315    SLP Time Calculation (min) 45 min    Activity Tolerance Patient tolerated treatment well             Past Medical History:  Diagnosis Date   Arthritis 2019   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 Aneita   Fibroid 2004   Per Dr. Lenon   History of shingles    face and mouth   Hx of skin cancer, basal cell    Hyperlipidemia 2022   lower legs and feet   Rosacea    Sciatica of left side 09/28/2013   Scoliosis    noted on mri done for back pain   Past Surgical History:  Procedure Laterality Date   BUNIONECTOMY     SPINE SURGERY  07/28/20   fused C5-C6   Patient Active Problem List   Diagnosis Date Noted   Hematuria 12/28/2023   Medication management 12/26/2023   Buttock wound, left, subsequent encounter 03/16/2023   Bronchiectasis with acute exacerbation (HCC) 03/15/2023   Buttock wound, left, initial encounter 11/02/2022   Orthostatic hypotension 08/13/2022   Neurogenic bowel 05/03/2022   Spasticity 05/03/2022   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/03/2022   Nerve pain 05/03/2022   Medication monitoring encounter 01/08/2022   Neurogenic bladder 10/11/2021   Urinary incontinence 10/11/2021   ESBL (extended spectrum beta-lactamase) producing bacteria infection 10/09/2021   Recurrent UTI 10/09/2021   Quadriplegia, C5-C7 incomplete (HCC) 01/16/2021   History of spinal fracture 01/16/2021   Suprapubic catheter (HCC) 01/16/2021   Encounter for  routine gynecological examination 09/28/2013   Onychomycosis 09/28/2013   Foot deformity, acquired 03/26/2012   Encounter for preventive health examination 12/25/2010   ROSACEA 08/25/2009   Disturbance in sleep behavior 03/11/2008   SKIN CANCER, HX OF 03/11/2008   DYSURIA, HX OF 03/11/2008   Hyperlipidemia 02/10/2007   CERVICALGIA 02/10/2007    Onset date: 12/13/2023 (referral date)  REFERRING DIAG: G82.50 (ICD-10-CM) - Quadriplegia (HCC) R47.89 (ICD-10-CM) - Quality of voice, breathiness R13.10 (ICD-10-CM) - Dysphagia, unspecified type  THERAPY DIAG:  Other voice and resonance disorders  Rationale for Evaluation and Treatment: Rehabilitation  SUBJECTIVE:   SUBJECTIVE STATEMENT: It sounds to me like I don't have enough volume and that it is gravely.  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 Pt is a 73 yr old L handed female with hx of incomplete quadriplegia- 2/14 2022- fleeing the police in [place] on passenger 100 (high speed) miles/hour,  [name]; neurogenic bowel and bladder and spasticity; [name], [name] Here for f/u on Incomplete quadriplegia   B femur fractures December, 2024.  PAIN:  Are you having pain? Yes, chronic, generalized   PATIENT GOALS:To have a stronger voice and improved swallow    SOCIAL HISTORY: Occupation:  retired, research officer, trade union, gives presentations until recently Water intake: optimal Caffeine/alcohol intake: minimal Daily voice use: minimal   PATIENT REPORTED OUTCOME MEASURES (PROM): V-RQOL: 22 - She rated a 4 or a lot of a problem having trouble doing her job due to voice; a 3 or a medium amount of problem speaking loudly, running out of air when talking, feeling anxious or frustrated due to voice. A 2 or a little problem feeling depressed due to voice, having to repeat to be understood, and being less  outgoing due to voice.                                                                                                                         TREATMENT DATE:     05/28/24: [name] enters with hoarse voice.  She reported that she perceives her voice as hoarse today. PhoRTE completed with supervision A verbal cues - averaged 70dB. Targeted sustained /a/, pitch glides, sentence production, and carry over of forward focus at the conversational level. Maintained clear phonation during ~85% of occasions. Perceived hoarseness/vocal strain appeared to occur more frequently during conversational speech as compared to structured speech tasks. Pt self corrected with supervision A verbal cues. Pt sipped on water throughout and discussed importance of hydration to optimize vocal hygiene. Reviewed dysphagia exercises. Pt completed 5 repetitions of Masako and 5 reps of Mendelsohn with supervision.   12/10/25[name] [name] enters with WNL volume and voice. They are completing IMST with questions - she is unable to complete more than 7 reps on IMST - reduced pressure by 1/4 turn back to setting at eval. She completed 10 reps of IMST with rare min A at lower pressure. PhoRTE completed with supervision cues - averaged 72dB. Targeted conversation carryover of WNL volume and clear phonation over 20 minutes - she self corrected hoarse voice with rare min A. She noted improved stamina when speaking as well  05/21/24: [name] enters with low hoarse voice. With sustained ah, voice cleared with rare min A 10/10 trials. They report completing RMST twice daily. After HEP completed with rare min A, targeted carryover of clear phonation with WNL volume (72dB average) in conversation. [name] gave summary of an article she is reading, described her Christmas ornaments and told a family story - with rare min A she maintained WNL volume. Clear phonation maintained 75% of utterances. [name] Id's when she becomes low and hoarse, however she  required usual min to mod A to correct hoarseness. She continues to work on using throat clear alternatives.   05/16/24: Increased IMST and EMST by 1/4 turn [name] [name] completed 5 set of 5 reps of both EMST and IMST - she noted increased difficulty however she rated effort 7-8/10. RMST completed with rare min A to mod I. HEP for voice - sustained Ah 10/10 trials with clear phonation sustained 8-10 seconds with rare min A, Glides completed 10/10 with rare min A. Paragraph reading with increased vocal intensity resulted in clear phonation 2/2 paragraphs  7-12 sentences (Grandfather and full Rainbow passage) with average of 72-74dB. Deysy endorsed feeling like she was shouting. With sound level meter and education re: WNL volume, explained that she is not shouting. Her personal care aid also reinforced that she is not shouting. She completed Masako 5/10 and Mendelson 6/10x with occasoinal min A.   05/14/24: Dysphagia/RMST: Pharyngeal strengthening exercises completed - Mendelson with extended time and occasional min verbal cues and modeling 8/12 trials. Introduced Masako - with max A, pt achieved 0/7 trials, however she demonstrates understanding of the exercise - will continue to practice at home. She verbalized swallow strategies of effortful swallow when eating and drinking with rare min A. IMST completed with rare min A 5 sets of 5 reps; EMST completed with rare min A 5 sets of 5 reps. Speech: Taneya enters with hoarse voice, she reports she has been hoarse all day - Completed resonant voice exercises at syllable, word, phrase and sentence with rare min A to achieve clear phonation with /m, p, b, v, z/ phonemes. Oral reading sentences with forward focus maintained clear phonation with rare min A. Targeted carryover of clear phonation and WNL volume into conversation by [name] role playing introducing her self at a conference, again with intentional speech voice remained clear. In task generating 1-2 sentence  descriptions with clear voice - [place] maintained clear voice 5/5 trials. She does report effort and fatigue when she uses higher intensity voice. She verbalized awareness that she can control her voice. Added to HEP 5 minute of conversation daily in her clear voice, as well as practicing what she says at conferences.     05/09/24: [name] was present for session. Pt wants to try to present at a couple of conferences in the future. Pt gets frustrated when people finish her sentence during conversation due to voice difficulties. SLP educated pt about self-advocacy before and during conversation to inform conversation partners about needing time to optimize voice. SLP reviewed physiology of voice (respiration, phonation, artic/resonance). SLP reviewed Resonant Voice Therapy (RVT) to increase and engage forward resonance for balanced phonation. During production of RVT /b, p, z/ words and phrases, pt achieved clear vocal quality in 90% of opportunities given frequent min A. SLP promoted pt self-awareness of dysphonic vocal quality to optimize use of voice strategies for generalization to conversation. Pt increasingly demonstrates awareness of voice quality pre vs. post structured exercises. SLP introduced conversation therapy training (CTT) to minimize laryngeal hyperfunction during conversation. Pt utilized forward resonance and diaphragmatic breath support during conversation 60% of the time given frequent min A. Pt's awareness during conversation improved throughout session. By end of session, pt was highly engaged in using voice strategies for achieving clear vocal quality. SLP provided pt HEP focused on sentence readings and continued RVT training with more sounds /m, d/. Plan is to review IMST/EMST in upcoming session and to address swallow needs. Voice therapy will continue in upcoming session as well.   05/08/24: EMST/IMST/Swallowing: [name] was present for session. Pt had questions regarding use of  EMST/IMST device. SLP guided pt and [name] through how to assemble EMST/IMST device and reviewed instructions for utilizing the device for pt. Pt performed EMST with 100% consistency at 28 cmH20 for 30 reps given rare min A. Pt performed IMST for 5 reps at 28 cmH20 with 90% consistency with rare min A. SLP encouraged pt to continue HEP program for EMST/IMST to optimize expiratory/inspiratory muscle strength for voice and swallowing. Pt and [name] were curious about recent MBSS results,  especially wanting to know if results were serious. SLP educated pt about MBSS results. Pt was also curious about reason for performing hard swallow in the place of a cough. SLP instructed pt about cough suppression strategies vs. Cough strategies for protecting upper airway during swallowing. Plan is to continue EMST/IMST in upcoming session and target pharyngeal strengthening exercises for promoting swallow function.   Voice: SLP educated pt about physiology of voice (respiration, phonation, artic/resonance). SLP introduced Resonant Voice Therapy (RVT) to optimize pt vocal quality and volume. During production of RVT /b, v, z, k/ syllables and words, pt achieved clear vocal quality in 90% of opportunities. Tongue and lip trills for semi-occluded vocal tract exercises were attempted with no apparent success for pt. SLP promoted attention to self-awareness of voice during structured exercises compared to voice prior to RVT to optimize pt awareness of voice. Pt demonstrates emerging awareness of voice quality pre vs. Post structured exercises. SLP provided HEP involving stimuli for targeted sounds during session for increasing forward resonance. Plan is to target sustained phonation /ah/ for increasing vocal volume and continuing RVT for optimizing balanced phonation.   05/02/24:  Swallow:  results and recommendations of MBSS - initiated training in HEP for pharyngeal dysphagia. Mendelson exercise completed 8x with occasional min  modeling and verbal cues after initial instructions. With rare min questioning cues, Ozella verbalized swallow precautions. Measure Maximum Inspiratory Pressure (MIP) and Maximum Expiratory Pressure (MEP) to target voice and dysphagia - MIP was 36cm/H2O (average for her age and gender is 12.08) MEP was 47 cm/H2O (averaged for age and gender is 61.92). Initiated training in RMST with device set at 25cm H2) for EMST and 30 for IMST - these are approximately 75% of MIP and MEP values. With usual mod A, Marialuiza demonstrated 5 sets of 5 reps of EMST and IMST. HEP is to complete 5 sets of 5 reps 3x a day. Speech: HEP for voice completed after modeling and instruction - she has been completing this inconsistently since evaluation. Mod A required for glides and modeling required for high pitch and low pitch sentences.   03/20/24: Voice evaluation completed. As pt stimulable for clear phonation and volume with increased vocal intensity, initiated training of high intensity voice exercises for HEP - with usual min modeling and verbal cues, Danyeal completed HEP including sustained vowel, pitch glides, oral reading in high and low pitch and reading with increased vocal intensity. No evidence of strain. Clear phonation maintained in all exercises. Initiated training on throat clear alternatives and identifying when she is speaking on residual air with usual mod A. Instructed her to try chewable Vit C for ease of swallowing.   PATIENT EDUCATION: Education details: HEP for voice, general swallow precautions, vocal hygiene Person educated: Patient and Arts Administrator Education method: Explanation, Demonstration, Verbal cues, and Handouts Education comprehension: verbal cues required and needs further education  HOME EXERCISE PROGRAM: High intensity voice exercises, PhoRTE  GOALS: Goals reviewed with patient? Yes  SHORT TERM GOALS: Target date: 05/16/24  Pt will complete HEP for dysphonia with mod I twice  daily Baseline: Goal status: ONGOING  2.  Pt will average 74dB 18/20 sentences Baseline:  Goal status: ONGOING  3.  Pt will follow diet modifications and swallow precautions pending MBSS with mod I Baseline:  Goal status: ONGOING  4.  Pt will complete RMST at 75% of MIP and MEP 75 reps daily with rare min A Baseline:  Goal status: ONGOING  5.  Pt will ID and correct  when speaking on residual air Baseline:  Goal status: ONGOING  6.  Pt will use throat clear alternatives 4/5 opportunities with occasional min A Baseline:  Goal status: ONGOING  LONG TERM GOALS: Target date: 06/12/24  Pt will complete HEP for dysphagia with mod I - pending MBSS Baseline:  Goal status: ONGOING  2.  Pt will maintain clear phonation and average 72dB over 15 minute conversation Baseline:  Goal status: ONGOING  3.  Pt will self correct speaking on residual volume with mod I Baseline:  Goal status: ONGOING  4.  Pt will improve score on VRQOL Baseline:  Goal status: ONGOING  ASSESSMENT:  CLINICAL IMPRESSION: Patient is a 73 y.o. female who was seen today for dysphonia and dysphagia. She reports ongoing hoarseness, running out of air when speaking and difficulty swallowing dense protein and larger pills. She is taking larger Vit C pills with applesauce, however they do get stuck - cutting them made raw edges which irritated her throat. She reports dysphagia since anterior neck surgery after he accident. She and her caregiver report dysphagia is worsening. Sonia does endorse phlegm after eating and after taking meds, which she clears by using multiple throat clears. She denies h/o MBSS. She was on pepcid for reflux which she stated she used to have however she has stopped taking this. Her voice is hoarse, with episodes of minimal aphonia when air runs out. She is noted to clear her throat repeatedly and speak on residual air, increasing vocal fry. 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 She failed [employer] screen. I recommend MBSS and skilled ST to maximize intelligibility and safety of swallow for safety, independence, to reduce caregiver burden as well as improve QOL/social participation. Consider referral to laryngologist to r/o vocal fold pathology due to progressive dysphonia and dysphagia  OBJECTIVE IMPAIRMENTS: include expressive language, voice disorder, and dysphagia. These impairments are limiting patient from effectively communicating at home and in community and safety when swallowing. Factors affecting potential to achieve goals and functional outcome are co-morbidities.. Patient will benefit from skilled SLP services to address above impairments and improve overall function.  REHAB POTENTIAL: Good  PLAN:  SLP FREQUENCY: 1-2x/week  SLP DURATION: 12 weeks  PLANNED INTERVENTIONS: Aspiration precaution training, Pharyngeal strengthening exercises, Diet toleration management , Language facilitation, Environmental controls, Trials of upgraded texture/liquids, Internal/external aids, Functional tasks, SLP instruction and feedback, Compensatory strategies, Patient/family education, [phone] Treatment of speech (30 or 45 min) , and [id number]- Speech Eval Behavioral Qualitative Voice Resonance, MBSS    [employer], CF-SLP 05/28/2024, 1:14 PM

## 2024-05-30 ENCOUNTER — Ambulatory Visit: Admitting: Podiatry

## 2024-05-30 ENCOUNTER — Encounter: Payer: Self-pay | Admitting: Speech Pathology

## 2024-05-30 ENCOUNTER — Ambulatory Visit: Admitting: Speech Pathology

## 2024-05-30 VITALS — Ht 64.0 in | Wt 118.8 lb

## 2024-05-30 DIAGNOSIS — M79674 Pain in right toe(s): Secondary | ICD-10-CM | POA: Diagnosis not present

## 2024-05-30 DIAGNOSIS — B351 Tinea unguium: Secondary | ICD-10-CM

## 2024-05-30 DIAGNOSIS — I7300 Raynaud's syndrome without gangrene: Secondary | ICD-10-CM | POA: Diagnosis not present

## 2024-05-30 DIAGNOSIS — M79675 Pain in left toe(s): Secondary | ICD-10-CM

## 2024-05-30 DIAGNOSIS — R498 Other voice and resonance disorders: Secondary | ICD-10-CM | POA: Diagnosis not present

## 2024-05-30 NOTE — Therapy (Signed)
 OUTPATIENT SPEECH LANGUAGE PATHOLOGY VOICE TREATMENT   Patient Name: [name] MRN: [id number] DOB:12/28/1950, 73 y.o., female Today's Date: 05/30/2024  PCP: [name], MD REFERRING PROVIDER: [name], MD  END OF SESSION:  End of Session - 05/30/24 1237     Visit Number 10    Number of Visits 13    Date for Recertification  06/12/24    Authorization Type [employer]    SLP Start Time 1230    SLP Stop Time  1315    SLP Time Calculation (min) 45 min    Activity Tolerance Patient tolerated treatment well             Past Medical History:  Diagnosis Date   Arthritis 2019   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 Aneita   Fibroid 2004   Per Dr. Lenon   History of shingles    face and mouth   Hx of skin cancer, basal cell    Hyperlipidemia 2022   lower legs and feet   Rosacea    Sciatica of left side 09/28/2013   Scoliosis    noted on mri done for back pain   Past Surgical History:  Procedure Laterality Date   BUNIONECTOMY     SPINE SURGERY  07/28/20   fused C5-C6   Patient Active Problem List   Diagnosis Date Noted   Hematuria 12/28/2023   Medication management 12/26/2023   Buttock wound, left, subsequent encounter 03/16/2023   Bronchiectasis with acute exacerbation (HCC) 03/15/2023   Buttock wound, left, initial encounter 11/02/2022   Orthostatic hypotension 08/13/2022   Neurogenic bowel 05/03/2022   Spasticity 05/03/2022   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/03/2022   Nerve pain 05/03/2022   Medication monitoring encounter 01/08/2022   Neurogenic bladder 10/11/2021   Urinary incontinence 10/11/2021   ESBL (extended spectrum beta-lactamase) producing bacteria infection 10/09/2021   Recurrent UTI 10/09/2021   Quadriplegia, C5-C7 incomplete (HCC) 01/16/2021   History of spinal fracture 01/16/2021   Suprapubic catheter (HCC) 01/16/2021   Encounter for  routine gynecological examination 09/28/2013   Onychomycosis 09/28/2013   Foot deformity, acquired 03/26/2012   Encounter for preventive health examination 12/25/2010   ROSACEA 08/25/2009   Disturbance in sleep behavior 03/11/2008   SKIN CANCER, HX OF 03/11/2008   DYSURIA, HX OF 03/11/2008   Hyperlipidemia 02/10/2007   CERVICALGIA 02/10/2007    Onset date: 12/13/2023 (referral date)  REFERRING DIAG: G82.50 (ICD-10-CM) - Quadriplegia (HCC) R47.89 (ICD-10-CM) - Quality of voice, breathiness R13.10 (ICD-10-CM) - Dysphagia, unspecified type  THERAPY DIAG:  Other voice and resonance disorders  Rationale for Evaluation and Treatment: Rehabilitation  SUBJECTIVE:   SUBJECTIVE STATEMENT: It sounds to me like I don't have enough volume and that it is gravely.  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 Pt is a 73 yr old L handed female with hx of incomplete quadriplegia- 2/14 2022- fleeing the police in [place] on passenger 100 (high speed) miles/hour,  [name]; neurogenic bowel and bladder and spasticity; [name], [name] Here for f/u on Incomplete quadriplegia   B femur fractures December, 2024.  PAIN:  Are you having pain? Yes, chronic, generalized   PATIENT GOALS:To have a stronger voice and improved swallow    SOCIAL HISTORY: Occupation:  retired, research officer, trade union, gives presentations until recently Water intake: optimal Caffeine/alcohol intake: minimal Daily voice use: minimal   PATIENT REPORTED OUTCOME MEASURES (PROM): V-RQOL: 22 - She rated a 4 or a lot of a problem having trouble doing her job due to voice; a 3 or a medium amount of problem speaking loudly, running out of air when talking, feeling anxious or frustrated due to voice. A 2 or a little problem feeling depressed due to voice, having to repeat to be understood, and being less  outgoing due to voice.                                                                                                                         TREATMENT DATE:   05/30/24: [name] presented with clear voice and adequate vocal intensity today. Patient and caregiver report that patient is diligent about her HEP.  Sustained /a/ x10 with average 86dB. Pt verbally read grandfather passage ranging from 72-80dB. SLP compared with previous recording with noticeably improved vocal clarity, intensity, and forward focus. Minimal-to-no vocal hoarseness noted. Re-reviewed IMST/EMST and discussed how to adjust intensity as needed. Educated and provided guided implementation on semi-occluded vocal tract exercises (SOVT) with straw phonation. Pt performed a series of humming, pitch glides, accents, and humming songs with supervision cues. Demonstrated use w/ straw in water. Provided pt with handout to include as part of HEP. Pt demonstrated and verbalized understanding.   12/15/25[name] [name] enters with hoarse voice.  She reported that she perceives her voice as hoarse today. PhoRTE completed with supervision A verbal cues - averaged 70dB. Targeted sustained /a/, pitch glides, sentence production, and carry over of forward focus at the conversational level. Maintained clear phonation during ~85% of occasions. Perceived hoarseness/vocal strain appeared to occur more frequently during conversational speech as compared to structured speech tasks. Pt self corrected with supervision A verbal cues. Pt sipped on water throughout and discussed importance of hydration to optimize vocal hygiene. Reviewed dysphagia exercises. Pt completed 5 repetitions of Masako and 5 reps of Mendelsohn with supervision.   12/10/25[name] [name] enters with WNL volume and voice. They are completing IMST with questions - she is unable to complete more than 7 reps on IMST - reduced pressure by 1/4 turn back to setting at eval. She completed 10 reps of IMST  with rare min A at lower pressure. PhoRTE completed with supervision cues - averaged 72dB. Targeted conversation carryover of WNL volume and clear phonation over 20 minutes - she self corrected hoarse voice with rare min A. She noted improved stamina when speaking as well  05/21/24: [name] enters with low hoarse voice. With sustained ah, voice cleared with rare min A 10/10 trials. They report completing RMST twice daily. After HEP completed with rare min A, targeted carryover of clear phonation with WNL volume (72dB average) in conversation. [name] gave summary of an article she is reading, described her Christmas ornaments and told a family story - with rare min A she maintained  WNL volume. Clear phonation maintained 75% of utterances. [name] Id's when she becomes low and hoarse, however she required usual min to mod A to correct hoarseness. She continues to work on using throat clear alternatives.   05/16/24: Increased IMST and EMST by 1/4 turn [name] [name] completed 5 set of 5 reps of both EMST and IMST - she noted increased difficulty however she rated effort 7-8/10. RMST completed with rare min A to mod I. HEP for voice - sustained Ah 10/10 trials with clear phonation sustained 8-10 seconds with rare min A, Glides completed 10/10 with rare min A. Paragraph reading with increased vocal intensity resulted in clear phonation 2/2 paragraphs 7-12 sentences (Grandfather and full Rainbow passage) with average of 72-74dB. [name] endorsed feeling like she was shouting. With sound level meter and education re: WNL volume, explained that she is not shouting. Her personal care aid also reinforced that she is not shouting. She completed Masako 5/10 and Mendelson 6/10x with occasoinal min A.   05/14/24: Dysphagia/RMST: Pharyngeal strengthening exercises completed - Mendelson with extended time and occasional min verbal cues and modeling 8/12 trials. Introduced Masako - with max A, pt achieved 0/7 trials, however she  demonstrates understanding of the exercise - will continue to practice at home. She verbalized swallow strategies of effortful swallow when eating and drinking with rare min A. IMST completed with rare min A 5 sets of 5 reps; EMST completed with rare min A 5 sets of 5 reps. Speech: [name] enters with hoarse voice, she reports she has been hoarse all day - Completed resonant voice exercises at syllable, word, phrase and sentence with rare min A to achieve clear phonation with /m, p, b, v, z/ phonemes. Oral reading sentences with forward focus maintained clear phonation with rare min A. Targeted carryover of clear phonation and WNL volume into conversation by [name] role playing introducing her self at a conference, again with intentional speech voice remained clear. In task generating 1-2 sentence descriptions with clear voice - [name] maintained clear voice 5/5 trials. She does report effort and fatigue when she uses higher intensity voice. She verbalized awareness that she can control her voice. Added to HEP 5 minute of conversation daily in her clear voice, as well as practicing what she says at conferences.     05/09/24: [name] was present for session. Pt wants to try to present at a couple of conferences in the future. Pt gets frustrated when people finish her sentence during conversation due to voice difficulties. SLP educated pt about self-advocacy before and during conversation to inform conversation partners about needing time to optimize voice. SLP reviewed physiology of voice (respiration, phonation, artic/resonance). SLP reviewed Resonant Voice Therapy (RVT) to increase and engage forward resonance for balanced phonation. During production of RVT /b, p, z/ words and phrases, pt achieved clear vocal quality in 90% of opportunities given frequent min A. SLP promoted pt self-awareness of dysphonic vocal quality to optimize use of voice strategies for generalization to conversation. Pt increasingly  demonstrates awareness of voice quality pre vs. post structured exercises. SLP introduced conversation therapy training (CTT) to minimize laryngeal hyperfunction during conversation. Pt utilized forward resonance and diaphragmatic breath support during conversation 60% of the time given frequent min A. Pt's awareness during conversation improved throughout session. By end of session, pt was highly engaged in using voice strategies for achieving clear vocal quality. SLP provided pt HEP focused on sentence readings and continued RVT training with more sounds /m, d/. Plan is to  review IMST/EMST in upcoming session and to address swallow needs. Voice therapy will continue in upcoming session as well.   05/08/24: EMST/IMST/Swallowing: [name] was present for session. Pt had questions regarding use of EMST/IMST device. SLP guided pt and [name] through how to assemble EMST/IMST device and reviewed instructions for utilizing the device for pt. Pt performed EMST with 100% consistency at 28 cmH20 for 30 reps given rare min A. Pt performed IMST for 5 reps at 28 cmH20 with 90% consistency with rare min A. SLP encouraged pt to continue HEP program for EMST/IMST to optimize expiratory/inspiratory muscle strength for voice and swallowing. Pt and [name] were curious about recent MBSS results, especially wanting to know if results were serious. SLP educated pt about MBSS results. Pt was also curious about reason for performing hard swallow in the place of a cough. SLP instructed pt about cough suppression strategies vs. Cough strategies for protecting upper airway during swallowing. Plan is to continue EMST/IMST in upcoming session and target pharyngeal strengthening exercises for promoting swallow function.   Voice: SLP educated pt about physiology of voice (respiration, phonation, artic/resonance). SLP introduced Resonant Voice Therapy (RVT) to optimize pt vocal quality and volume. During production of RVT /b, v, z, k/ syllables  and words, pt achieved clear vocal quality in 90% of opportunities. Tongue and lip trills for semi-occluded vocal tract exercises were attempted with no apparent success for pt. SLP promoted attention to self-awareness of voice during structured exercises compared to voice prior to RVT to optimize pt awareness of voice. Pt demonstrates emerging awareness of voice quality pre vs. Post structured exercises. SLP provided HEP involving stimuli for targeted sounds during session for increasing forward resonance. Plan is to target sustained phonation /ah/ for increasing vocal volume and continuing RVT for optimizing balanced phonation.   05/02/24:  Swallow:  results and recommendations of MBSS - initiated training in HEP for pharyngeal dysphagia. Mendelson exercise completed 8x with occasional min modeling and verbal cues after initial instructions. With rare min questioning cues, [name] verbalized swallow precautions. Measure Maximum Inspiratory Pressure (MIP) and Maximum Expiratory Pressure (MEP) to target voice and dysphagia - MIP was 36cm/H2O (average for her age and gender is 25.08) MEP was 50 cm/H2O (averaged for age and gender is 61.92). Initiated training in RMST with device set at 25cm H2) for EMST and 30 for IMST - these are approximately 75% of MIP and MEP values. With usual mod A, Keelia demonstrated 5 sets of 5 reps of EMST and IMST. HEP is to complete 5 sets of 5 reps 3x a day. Speech: HEP for voice completed after modeling and instruction - she has been completing this inconsistently since evaluation. Mod A required for glides and modeling required for high pitch and low pitch sentences.   03/20/24: Voice evaluation completed. As pt stimulable for clear phonation and volume with increased vocal intensity, initiated training of high intensity voice exercises for HEP - with usual min modeling and verbal cues, [name] completed HEP including sustained vowel, pitch glides, oral reading in high and low  pitch and reading with increased vocal intensity. No evidence of strain. Clear phonation maintained in all exercises. Initiated training on throat clear alternatives and identifying when she is speaking on residual air with usual mod A. Instructed her to try chewable Vit C for ease of swallowing.   PATIENT EDUCATION: Education details: HEP for voice, general swallow precautions, vocal hygiene Person educated: Patient and Arts Administrator Education method: Explanation, Demonstration, Verbal cues, and Handouts Education  comprehension: verbal cues required and needs further education  HOME EXERCISE PROGRAM: High intensity voice exercises, PhoRTE  GOALS: Goals reviewed with patient? Yes  SHORT TERM GOALS: Target date: 05/16/24  Pt will complete HEP for dysphonia with mod I twice daily Baseline: Goal status: MET  2.  Pt will average 74dB 18/20 sentences Baseline:  Goal status: MET  3.  Pt will follow diet modifications and swallow precautions pending MBSS with mod I Baseline:  Goal status: MET  4.  Pt will complete RMST at 75% of MIP and MEP 75 reps daily with rare min A Baseline:  Goal status: NOT MET  5.  Pt will ID and correct when speaking on residual air Baseline:  Goal status: MET  6.  Pt will use throat clear alternatives 4/5 opportunities with occasional min A Baseline:  Goal status: MET  LONG TERM GOALS: Target date: 06/12/24  Pt will complete HEP for dysphagia with mod I - pending MBSS Baseline:  Goal status: ONGOING  2.  Pt will maintain clear phonation and average 72dB over 15 minute conversation Baseline:  Goal status: ONGOING  3.  Pt will self correct speaking on residual volume with mod I Baseline:  Goal status: ONGOING  4.  Pt will improve score on VRQOL Baseline:  Goal status: ONGOING  ASSESSMENT:  CLINICAL IMPRESSION: Patient is a 73 y.o. female who was seen today for dysphonia and dysphagia. She reports ongoing hoarseness, running out of air  when speaking and difficulty swallowing dense protein and larger pills. [name] has improved voice quality and endurance - she maintains clear phonation for 20 minute conversations in therapy. She reports carryover at home during structured practice. She is completing HEP for swallowing and voice with mod I. She is completing IMST and EMST with mod I at 60% MEP and MIP. Continue skilled ST to maximize intelligibility and safety of swallow.  OBJECTIVE IMPAIRMENTS: include expressive language, voice disorder, and dysphagia. These impairments are limiting patient from effectively communicating at home and in community and safety when swallowing. Factors affecting potential to achieve goals and functional outcome are co-morbidities.. Patient will benefit from skilled SLP services to address above impairments and improve overall function.  REHAB POTENTIAL: Good  PLAN:  SLP FREQUENCY: 1-2x/week  SLP DURATION: 12 weeks  PLANNED INTERVENTIONS: Aspiration precaution training, Pharyngeal strengthening exercises, Diet toleration management , Language facilitation, Environmental controls, Trials of upgraded texture/liquids, Internal/external aids, Functional tasks, SLP instruction and feedback, Compensatory strategies, Patient/family education, [phone] Treatment of speech (30 or 45 min) , and [id number]- Speech Eval Behavioral Qualitative Voice Resonance, MBSS   Speech Therapy Progress Note  Dates of Reporting Period: 03/20/24 to 05/30/24  Objective Reports of Subjective Statement: Maintaining clear phonation in conversation during ST  Objective Measurements: See treatment notes  Goal Update: continue goals  Plan: continue POC  Reason Skilled Services are Required: Carry over of clear phonation and intelligibility strategies outside of ST  Carmen Hoehn MS, CCC-SLP 05/30/2024, 12:43 PM

## 2024-05-30 NOTE — Patient Instructions (Signed)
°  Semi-occluded vocal tract exercises (SOVTE)  These allow your vocal folds to vibrate without excess tension and promotes high placement of the voice  Use SOVTE as a warm up before prolonged speaking and vocal exercises  Hum 10 seconds, 10x  Pitch Glides  10x  Accents (siren)  Hum the [employer]  A goal would be 2-3 minutes several times a day and prior to vocal exercises  As always, use good belly breathing while completing SOVTE  Complete in water or out of water

## 2024-06-02 NOTE — Progress Notes (Signed)
"  °  Subjective:  Patient ID: [name], female    DOB: 01-24-1951,  MRN: [id number]  Chief Complaint  Patient presents with   Nail Problem    Rm 2 Patient is here for nail trim and concern about right/left index toe pushing against the great toe causing discomfort.(Raynaud's?) inspected.    73 y.o. female presents with the above complaint. History confirmed with patient.  She is doing well with the nails have become somewhat elongated again and causing discomfort.  Still using compression stockings.  Objective:  Physical Exam: Edema is controlled .  Skin temperature and texture is normal, pulses are palpable.  Yellowed elongated thickened nails subungual debris and dystrophy x 10.  No active ulceration or signs of infection.  Some cyanosis worse today, her lateral hallux has some skin thinning but no ulceration Assessment:   1. Pain due to onychomycosis of toenails of both feet   2. Raynaud's disease without gangrene         Plan:  Patient was evaluated and treated and all questions answered.  Discussed the etiology and treatment options for the condition in detail with the patient. Recommended debridement of the nails today. Sharp and mechanical debridement performed of all painful and mycotic nails today. Nails debrided in length and thickness using a nail nipper to level of comfort. Discussed treatment options including appropriate shoe gear. Follow up as needed for painful nails.  Remains ulcer free. Having a bit of a Raynaud flare on left hallux but no skin breakdown. OK to use spacers, cont open toed compression stockings, reviewed keeping feet warm  Return in about 4 months (around 09/28/2024) for nail trim, Raynaud f/u.  "

## 2024-06-04 ENCOUNTER — Ambulatory Visit: Admitting: Speech Pathology

## 2024-06-06 ENCOUNTER — Ambulatory Visit: Admitting: Speech Pathology

## 2024-06-06 ENCOUNTER — Encounter: Payer: Self-pay | Admitting: Speech Pathology

## 2024-06-06 DIAGNOSIS — R498 Other voice and resonance disorders: Secondary | ICD-10-CM | POA: Diagnosis not present

## 2024-06-06 NOTE — Therapy (Signed)
 " OUTPATIENT SPEECH LANGUAGE PATHOLOGY VOICE TREATMENT & DISCHARGE SUMMARY   Patient Name: [name] MRN: [id number] DOB:July 20, 1950, 73 y.o., female Today's Date: 06/06/2024  PCP: [name], MD REFERRING PROVIDER: [name], MD  END OF SESSION:  End of Session - 06/06/24 1247     Visit Number 11    Number of Visits 13    Date for Recertification  06/12/24    Authorization Type [employer]    SLP Start Time 1230    SLP Stop Time  1310    SLP Time Calculation (min) 40 min    Activity Tolerance Patient tolerated treatment well             Past Medical History:  Diagnosis Date   Arthritis 2019   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 Aneita   Fibroid 2004   Per Dr. Lenon   History of shingles    face and mouth   Hx of skin cancer, basal cell    Hyperlipidemia 2022   lower legs and feet   Rosacea    Sciatica of left side 09/28/2013   Scoliosis    noted on mri done for back pain   Past Surgical History:  Procedure Laterality Date   BUNIONECTOMY     SPINE SURGERY  07/28/20   fused C5-C6   Patient Active Problem List   Diagnosis Date Noted   Hematuria 12/28/2023   Medication management 12/26/2023   Buttock wound, left, subsequent encounter 03/16/2023   Bronchiectasis with acute exacerbation (HCC) 03/15/2023   Buttock wound, left, initial encounter 11/02/2022   Orthostatic hypotension 08/13/2022   Neurogenic bowel 05/03/2022   Spasticity 05/03/2022   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/03/2022   Nerve pain 05/03/2022   Medication monitoring encounter 01/08/2022   Neurogenic bladder 10/11/2021   Urinary incontinence 10/11/2021   ESBL (extended spectrum beta-lactamase) producing bacteria infection 10/09/2021   Recurrent UTI 10/09/2021   Quadriplegia, C5-C7 incomplete (HCC) 01/16/2021   History of spinal fracture 01/16/2021   Suprapubic catheter (HCC) 01/16/2021    Encounter for routine gynecological examination 09/28/2013   Onychomycosis 09/28/2013   Foot deformity, acquired 03/26/2012   Encounter for preventive health examination 12/25/2010   ROSACEA 08/25/2009   Disturbance in sleep behavior 03/11/2008   SKIN CANCER, HX OF 03/11/2008   DYSURIA, HX OF 03/11/2008   Hyperlipidemia 02/10/2007   CERVICALGIA 02/10/2007    Onset date: 12/13/2023 (referral date)  REFERRING DIAG: G82.50 (ICD-10-CM) - Quadriplegia (HCC) R47.89 (ICD-10-CM) - Quality of voice, breathiness R13.10 (ICD-10-CM) - Dysphagia, unspecified type  THERAPY DIAG:  Other voice and resonance disorders  Rationale for Evaluation and Treatment: Rehabilitation  SUBJECTIVE:   SUBJECTIVE STATEMENT: It sounds to me like I don't have enough volume and that it is gravely.  Pt accompanied by: Live in caregiver, [name]  PERTINENT HISTORY: C7 ASIA C- incomplete quad w/ neurogenic bladder and bowel, HLD, Hx of skin cancer. Pt is a 73 yr old L handed female with hx of incomplete quadriplegia- 2/14 2022- fleeing the police in [place] on passenger 100 (high speed) miles/hour,  [name]; neurogenic bowel and bladder and spasticity; [name], [name] Here for f/u on Incomplete quadriplegia   B femur fractures December, 2024.  PAIN:  Are you having pain? Yes, chronic, generalized   PATIENT GOALS:To have a stronger voice and improved swallow  SOCIAL HISTORY: Occupation: retired, research officer, trade union, gives presentations until recently Water intake: optimal Caffeine/alcohol intake: minimal Daily voice use: minimal   PATIENT REPORTED OUTCOME MEASURES (PROM): V-RQOL: 22 - She rated a 4 or a lot of a problem having trouble doing her job due to voice; a 3 or a medium amount of problem speaking loudly, running out of air when talking, feeling anxious or frustrated due to voice. A 2 or a little problem feeling depressed due to voice, having to repeat to be understood, and  being less outgoing due to voice.                                                                                                                         TREATMENT DATE:   06/06/24: [name] enters mildly hoarse - she self corrects to clear voice and 70-74dB with mod I. She completed SOVTE in water today with rare min A. High intensity voice exercises and swallow exercises with mod I 5/5 reps each. Increased RMST by 1/8 turn - she completed 25 reps IMST and EMST with mod I. In conversation, Johneisha carries of WNL volume and clear phonation with supervision cues. She improved score on VRQOL from 22 to 14 - rating  1 or 2 no problem or a little problem on every item, except a 3 or medium problem running out of air when talking. Goals met, d/c ST  05/30/24: [name] presented with clear voice and adequate vocal intensity today. Patient and caregiver report that patient is diligent about her HEP.  Sustained /a/ x10 with average 86dB. Pt verbally read grandfather passage ranging from 72-80dB. SLP compared with previous recording with noticeably improved vocal clarity, intensity, and forward focus. Minimal-to-no vocal hoarseness noted. Re-reviewed IMST/EMST and discussed how to adjust intensity as needed. Educated and provided guided implementation on semi-occluded vocal tract exercises (SOVT) with straw phonation. Pt performed a series of humming, pitch glides, accents, and humming songs with supervision cues. Demonstrated use w/ straw in water. Provided pt with handout to include as part of HEP. Pt demonstrated and verbalized understanding.   12/15/25[name] [name] enters with hoarse voice.  She reported that she perceives her voice as hoarse today. PhoRTE completed with supervision A verbal cues - averaged 70dB. Targeted sustained /a/, pitch glides, sentence production, and carry over of forward focus at the conversational level. Maintained clear phonation during ~85% of occasions. Perceived hoarseness/vocal strain  appeared to occur more frequently during conversational speech as compared to structured speech tasks. Pt self corrected with supervision A verbal cues. Pt sipped on water throughout and discussed importance of hydration to optimize vocal hygiene. Reviewed dysphagia exercises. Pt completed 5 repetitions of Masako and 5 reps of Mendelsohn with supervision.   12/10/25[name] [name] enters with WNL volume and voice. They are completing IMST with questions - she is unable to complete more than 7 reps on IMST - reduced pressure by 1/4 turn back to setting at eval. She completed 10 reps of IMST with  rare min A at lower pressure. PhoRTE completed with supervision cues - averaged 72dB. Targeted conversation carryover of WNL volume and clear phonation over 20 minutes - she self corrected hoarse voice with rare min A. She noted improved stamina when speaking as well  05/21/24: Lilie enters with low hoarse voice. With sustained ah, voice cleared with rare min A 10/10 trials. They report completing RMST twice daily. After HEP completed with rare min A, targeted carryover of clear phonation with WNL volume (72dB average) in conversation. Tzipora gave summary of an article she is reading, described her Christmas ornaments and told a family story - with rare min A she maintained WNL volume. Clear phonation maintained 75% of utterances. [name] when she becomes low and hoarse, however she required usual min to mod A to correct hoarseness. She continues to work on using throat clear alternatives.   05/16/24: Increased IMST and EMST by 1/4 turn [name] [name] completed 5 set of 5 reps of both EMST and IMST - she noted increased difficulty however she rated effort 7-8/10. RMST completed with rare min A to mod I. HEP for voice - sustained Ah 10/10 trials with clear phonation sustained 8-10 seconds with rare min A, Glides completed 10/10 with rare min A. Paragraph reading with increased vocal intensity resulted in clear phonation 2/2  paragraphs 7-12 sentences (Grandfather and full Rainbow passage) with average of 72-74dB. Mayla endorsed feeling like she was shouting. With sound level meter and education re: WNL volume, explained that she is not shouting. Her personal care aid also reinforced that she is not shouting. She completed Masako 5/10 and Mendelson 6/10x with occasoinal min A.   05/14/24: Dysphagia/RMST: Pharyngeal strengthening exercises completed - Mendelson with extended time and occasional min verbal cues and modeling 8/12 trials. Introduced Masako - with max A, pt achieved 0/7 trials, however she demonstrates understanding of the exercise - will continue to practice at home. She verbalized swallow strategies of effortful swallow when eating and drinking with rare min A. IMST completed with rare min A 5 sets of 5 reps; EMST completed with rare min A 5 sets of 5 reps. Speech: Adaia enters with hoarse voice, she reports she has been hoarse all day - Completed resonant voice exercises at syllable, word, phrase and sentence with rare min A to achieve clear phonation with /m, p, b, v, z/ phonemes. Oral reading sentences with forward focus maintained clear phonation with rare min A. Targeted carryover of clear phonation and WNL volume into conversation by [name] role playing introducing her self at a conference, again with intentional speech voice remained clear. In task generating 1-2 sentence descriptions with clear voice - Cadi maintained clear voice 5/5 trials. She does report effort and fatigue when she uses higher intensity voice. She verbalized awareness that she can control her voice. Added to HEP 5 minute of conversation daily in her clear voice, as well as practicing what she says at conferences.     05/09/24: [name] was present for session. Pt wants to try to present at a couple of conferences in the future. Pt gets frustrated when people finish her sentence during conversation due to voice difficulties. SLP  educated pt about self-advocacy before and during conversation to inform conversation partners about needing time to optimize voice. SLP reviewed physiology of voice (respiration, phonation, artic/resonance). SLP reviewed Resonant Voice Therapy (RVT) to increase and engage forward resonance for balanced phonation. During production of RVT /b, p, z/ words and phrases, pt achieved clear vocal  quality in 90% of opportunities given frequent min A. SLP promoted pt self-awareness of dysphonic vocal quality to optimize use of voice strategies for generalization to conversation. Pt increasingly demonstrates awareness of voice quality pre vs. post structured exercises. SLP introduced conversation therapy training (CTT) to minimize laryngeal hyperfunction during conversation. Pt utilized forward resonance and diaphragmatic breath support during conversation 60% of the time given frequent min A. Pt's awareness during conversation improved throughout session. By end of session, pt was highly engaged in using voice strategies for achieving clear vocal quality. SLP provided pt HEP focused on sentence readings and continued RVT training with more sounds /m, d/. Plan is to review IMST/EMST in upcoming session and to address swallow needs. Voice therapy will continue in upcoming session as well.   05/08/24: EMST/IMST/Swallowing: [name] was present for session. Pt had questions regarding use of EMST/IMST device. SLP guided pt and [name] through how to assemble EMST/IMST device and reviewed instructions for utilizing the device for pt. Pt performed EMST with 100% consistency at 28 cmH20 for 30 reps given rare min A. Pt performed IMST for 5 reps at 28 cmH20 with 90% consistency with rare min A. SLP encouraged pt to continue HEP program for EMST/IMST to optimize expiratory/inspiratory muscle strength for voice and swallowing. Pt and [name] were curious about recent MBSS results, especially wanting to know if results were serious. SLP  educated pt about MBSS results. Pt was also curious about reason for performing hard swallow in the place of a cough. SLP instructed pt about cough suppression strategies vs. Cough strategies for protecting upper airway during swallowing. Plan is to continue EMST/IMST in upcoming session and target pharyngeal strengthening exercises for promoting swallow function.   Voice: SLP educated pt about physiology of voice (respiration, phonation, artic/resonance). SLP introduced Resonant Voice Therapy (RVT) to optimize pt vocal quality and volume. During production of RVT /b, v, z, k/ syllables and words, pt achieved clear vocal quality in 90% of opportunities. Tongue and lip trills for semi-occluded vocal tract exercises were attempted with no apparent success for pt. SLP promoted attention to self-awareness of voice during structured exercises compared to voice prior to RVT to optimize pt awareness of voice. Pt demonstrates emerging awareness of voice quality pre vs. Post structured exercises. SLP provided HEP involving stimuli for targeted sounds during session for increasing forward resonance. Plan is to target sustained phonation /ah/ for increasing vocal volume and continuing RVT for optimizing balanced phonation.   05/02/24:  Swallow:  results and recommendations of MBSS - initiated training in HEP for pharyngeal dysphagia. Mendelson exercise completed 8x with occasional min modeling and verbal cues after initial instructions. With rare min questioning cues, Franca verbalized swallow precautions. Measure Maximum Inspiratory Pressure (MIP) and Maximum Expiratory Pressure (MEP) to target voice and dysphagia - MIP was 36cm/H2O (average for her age and gender is 64.08) MEP was 60 cm/H2O (averaged for age and gender is 61.92). Initiated training in RMST with device set at 25cm H2) for EMST and 30 for IMST - these are approximately 75% of MIP and MEP values. With usual mod A, Dawn demonstrated 5 sets of 5 reps of  EMST and IMST. HEP is to complete 5 sets of 5 reps 3x a day. Speech: HEP for voice completed after modeling and instruction - she has been completing this inconsistently since evaluation. Mod A required for glides and modeling required for high pitch and low pitch sentences.   03/20/24: Voice evaluation completed. As pt stimulable for clear phonation  and volume with increased vocal intensity, initiated training of high intensity voice exercises for HEP - with usual min modeling and verbal cues, Yentl completed HEP including sustained vowel, pitch glides, oral reading in high and low pitch and reading with increased vocal intensity. No evidence of strain. Clear phonation maintained in all exercises. Initiated training on throat clear alternatives and identifying when she is speaking on residual air with usual mod A. Instructed her to try chewable Vit C for ease of swallowing.   PATIENT EDUCATION: Education details: HEP for voice, general swallow precautions, vocal hygiene Person educated: Patient and Arts Administrator Education method: Explanation, Demonstration, Verbal cues, and Handouts Education comprehension: verbal cues required and needs further education  HOME EXERCISE PROGRAM: High intensity voice exercises, PhoRTE  GOALS: Goals reviewed with patient? Yes  SHORT TERM GOALS: Target date: 05/16/24  Pt will complete HEP for dysphonia with mod I twice daily Baseline: Goal status: MET  2.  Pt will average 74dB 18/20 sentences Baseline:  Goal status: MET  3.  Pt will follow diet modifications and swallow precautions pending MBSS with mod I Baseline:  Goal status: MET  4.  Pt will complete RMST at 75% of MIP and MEP 75 reps daily with rare min A Baseline:  Goal status: NOT MET  5.  Pt will ID and correct when speaking on residual air Baseline:  Goal status: MET  6.  Pt will use throat clear alternatives 4/5 opportunities with occasional min A Baseline:  Goal status:  MET  LONG TERM GOALS: Target date: 06/12/24  Pt will complete HEP for dysphagia with mod I - pending MBSS Baseline:  Goal status: MET  2.  Pt will maintain clear phonation and average 72dB over 15 minute conversation Baseline:  Goal status: MET  3.  Pt will self correct speaking on residual volume with mod I Baseline:  Goal status: MET  4.  Pt will improve score on VRQOL Baseline:  Goal status: MET  ASSESSMENT:  CLINICAL IMPRESSION: Patient is a 73 y.o. female who was seen today for dysphonia and dysphagia.  Amaurie has improved voice quality and endurance - she maintains clear phonation for 20 minute conversations in therapy. She reports carryover at home during structured practice. She is completing HEP for swallowing and voice with mod I. She is completing IMST and EMST with mod I at 60% MEP and MIP. She reports carryover of clear phonation in conversation when she is intentional about using her strategies to achieve WNL volume and voice.  She is completing HEP for dysphagia with mod I. Goals met, d/c ST - she is in agreement. She is to continue HEPs for 6 more weeks.   OBJECTIVE IMPAIRMENTS: include expressive language, voice disorder, and dysphagia. These impairments are limiting patient from effectively communicating at home and in community and safety when swallowing. Factors affecting potential to achieve goals and functional outcome are co-morbidities.. Patient will benefit from skilled SLP services to address above impairments and improve overall function.  REHAB POTENTIAL: Good  PLAN:  SLP FREQUENCY: 1-2x/week  SLP DURATION: 12 weeks  PLANNED INTERVENTIONS: Aspiration precaution training, Pharyngeal strengthening exercises, Diet toleration management , Language facilitation, Environmental controls, Trials of upgraded texture/liquids, Internal/external aids, Functional tasks, SLP instruction and feedback, Compensatory strategies, Patient/family education, [phone] Treatment  of speech (30 or 45 min) , and [id number]- Speech Eval Behavioral Qualitative Voice Resonance, MBSS  SPEECH THERAPY DISCHARGE SUMMARY  Visits from Start of Care: 11  Current functional level related to goals /  functional outcomes: See goals above   Remaining deficits: Mildly hoarse when fatigued   Education / Equipment: HEP for dysphonia; HEP for dysphagia; vocal hygiene   Patient agrees to discharge. Patient goals were met. Patient is being discharged due to meeting the stated rehab goals.SABRA Leita Hoehn MS, CCC-SLP 06/06/2024, 1:22 PM      "

## 2024-06-13 ENCOUNTER — Ambulatory Visit: Admitting: Podiatry

## 2024-06-18 ENCOUNTER — Other Ambulatory Visit: Payer: Self-pay | Admitting: Internal Medicine

## 2024-06-18 ENCOUNTER — Encounter: Attending: Physical Medicine and Rehabilitation | Admitting: Physical Medicine and Rehabilitation

## 2024-06-18 ENCOUNTER — Encounter: Payer: Self-pay | Admitting: Physical Medicine and Rehabilitation

## 2024-06-18 VITALS — BP 104/72 | HR 79 | Ht 64.0 in

## 2024-06-18 DIAGNOSIS — Z993 Dependence on wheelchair: Secondary | ICD-10-CM | POA: Diagnosis not present

## 2024-06-18 DIAGNOSIS — K592 Neurogenic bowel, not elsewhere classified: Secondary | ICD-10-CM | POA: Insufficient documentation

## 2024-06-18 DIAGNOSIS — M792 Neuralgia and neuritis, unspecified: Secondary | ICD-10-CM | POA: Diagnosis not present

## 2024-06-18 DIAGNOSIS — G8254 Quadriplegia, C5-C7 incomplete: Secondary | ICD-10-CM | POA: Insufficient documentation

## 2024-06-18 DIAGNOSIS — N319 Neuromuscular dysfunction of bladder, unspecified: Secondary | ICD-10-CM | POA: Insufficient documentation

## 2024-06-18 DIAGNOSIS — R252 Cramp and spasm: Secondary | ICD-10-CM | POA: Insufficient documentation

## 2024-06-18 NOTE — Patient Instructions (Signed)
 Pt is a 74 yr old L handed female with hx of incomplete  C7 quadriplegia- 2/14 2022- fleeing the police in [place] on passenger 100 (high speed) miles/hour,  [name]; neurogenic bowel and bladder  SPC (+)and spasticity; [name], [name] B/L femur fractures 12/24.  Here for f/u on Incomplete quadriplegia  Ring splints might be helpful- would do L ring finger and R inde finger- not really otherwise. Might also help typing as well. Don't want pressure ulcers from it, so watch skin.   2.   Tried Senna-  2 tabs/day- in past- didn't work-  so I suggest 1-2 senna daily-  max 6 tabs/day-  8-12 hours before the bowel program.  Discussed with pt how to manage bowel program.   3.   Could try to make Zanaflex /Tizanidine  as needed- 2 mg at bedtime-    4. Not using Enemeez- regularly.    5. Con't Baclofen - 20 mg  7 pills/140 mg daily - con't regimen- doesn't need refills.   6. Con't Gabapentin  1200 mg 3x/day- PCP writing it.    7. As aging occurs, and osteoporosis and arthritis- it can make spasticity worse-  and explains spasticity can get worse-  as well as fractures have increased risk.   8. Patient will need to start PT and OT-to start in February- was off due to needing a break. But ready to go back to work on; wants ot work on fine motor movement and dexterity- and continue to work on reach, core, balance and stand pivot for transfers.   9.  F/U 3 months- double SCI-
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
## 2024-06-20 ENCOUNTER — Encounter: Payer: Self-pay | Admitting: Internal Medicine

## 2024-06-21 ENCOUNTER — Encounter: Payer: Self-pay | Admitting: Physical Medicine and Rehabilitation

## 2024-07-03 ENCOUNTER — Ambulatory Visit: Admitting: Podiatry

## 2024-07-11 ENCOUNTER — Other Ambulatory Visit: Payer: Self-pay | Admitting: "Endocrinology

## 2024-07-14 ENCOUNTER — Other Ambulatory Visit: Payer: Self-pay | Admitting: Internal Medicine

## 2024-07-18 ENCOUNTER — Encounter: Payer: Self-pay | Admitting: Physical Medicine and Rehabilitation

## 2024-07-18 ENCOUNTER — Other Ambulatory Visit: Payer: Self-pay | Admitting: Infectious Diseases

## 2024-07-18 DIAGNOSIS — G8250 Quadriplegia, unspecified: Secondary | ICD-10-CM

## 2024-07-19 ENCOUNTER — Encounter: Payer: Self-pay | Admitting: "Endocrinology

## 2024-07-19 ENCOUNTER — Telehealth: Admitting: "Endocrinology

## 2024-07-19 VITALS — Ht 64.0 in | Wt 115.0 lb

## 2024-07-19 DIAGNOSIS — M810 Age-related osteoporosis without current pathological fracture: Secondary | ICD-10-CM

## 2024-07-19 NOTE — Patient Instructions (Signed)
 Forteo  (teriparatide ) should generally be avoided if you have or have had bone cancer or cancer that has spread to the bone (bone metastases), as it carries a risk of osteosarcoma. However, common skin cancers (basal cell or squamous cell) that have not metastasized to the bone are not listed as absolute contraindications. Always consult a doctor for a personalized risk assessment.   Key Considerations:  Bone Cancer Risk: Forteo  carries a black box warning regarding an increased risk of osteosarcoma (a malignant bone tumor) based on animal studies.  Metastatic Cancer: If a cancer has spread to the bones, or if there is a history of skeletal radiation, Forteo  should not be used.  Skin Condition: If you have a skin condition with lumps or sores caused by too much calcium  (calcinosis cutis), you should not take Forteo .  Treatment Duration: To limit risks, treatment with Forteo  is generally limited to two years.
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
## 2024-08-02 ENCOUNTER — Ambulatory Visit: Admitting: Infectious Diseases

## 2024-09-21 ENCOUNTER — Encounter: Admitting: Physical Medicine and Rehabilitation

## 2024-10-02 ENCOUNTER — Ambulatory Visit: Admitting: Podiatry

## 2024-10-08 ENCOUNTER — Encounter: Attending: Physical Medicine and Rehabilitation | Admitting: Physical Medicine and Rehabilitation

## 2025-02-06 ENCOUNTER — Ambulatory Visit
